# Patient Record
Sex: Male | Born: 1965
Health system: Southern US, Community
[De-identification: ages and names within clinical notes are randomized; demographics above are authoritative.]

## PROBLEM LIST (undated history)

## (undated) DIAGNOSIS — G709 Myoneural disorder, unspecified: Secondary | ICD-10-CM

## (undated) DIAGNOSIS — D72819 Decreased white blood cell count, unspecified: Secondary | ICD-10-CM

## (undated) DIAGNOSIS — K7460 Unspecified cirrhosis of liver: Secondary | ICD-10-CM

## (undated) DIAGNOSIS — I8290 Acute embolism and thrombosis of unspecified vein: Secondary | ICD-10-CM

## (undated) DIAGNOSIS — I4891 Unspecified atrial fibrillation: Secondary | ICD-10-CM

## (undated) DIAGNOSIS — I10 Essential (primary) hypertension: Secondary | ICD-10-CM

## (undated) DIAGNOSIS — M069 Rheumatoid arthritis, unspecified: Secondary | ICD-10-CM

## (undated) DIAGNOSIS — F339 Major depressive disorder, recurrent, unspecified: Secondary | ICD-10-CM

## (undated) DIAGNOSIS — J449 Chronic obstructive pulmonary disease, unspecified: Secondary | ICD-10-CM

## (undated) DIAGNOSIS — E1159 Type 2 diabetes mellitus with other circulatory complications: Secondary | ICD-10-CM

## (undated) DIAGNOSIS — K219 Gastro-esophageal reflux disease without esophagitis: Secondary | ICD-10-CM

## (undated) DIAGNOSIS — K5900 Constipation, unspecified: Secondary | ICD-10-CM

## (undated) DIAGNOSIS — F419 Anxiety disorder, unspecified: Secondary | ICD-10-CM

## (undated) DIAGNOSIS — I509 Heart failure, unspecified: Secondary | ICD-10-CM

## (undated) DIAGNOSIS — L409 Psoriasis, unspecified: Secondary | ICD-10-CM

## (undated) DIAGNOSIS — Z8639 Personal history of other endocrine, nutritional and metabolic disease: Secondary | ICD-10-CM

## (undated) DIAGNOSIS — D696 Thrombocytopenia, unspecified: Secondary | ICD-10-CM

## (undated) DIAGNOSIS — G4730 Sleep apnea, unspecified: Secondary | ICD-10-CM

## (undated) DIAGNOSIS — I499 Cardiac arrhythmia, unspecified: Secondary | ICD-10-CM

## (undated) HISTORY — DX: Myoneural disorder, unspecified: G70.9

## (undated) HISTORY — DX: Constipation, unspecified: K59.00

## (undated) HISTORY — DX: Thrombocytopenia, unspecified: D69.6

## (undated) HISTORY — PX: SCIATIC NERVE EXPLORATION: SHX5373

## (undated) HISTORY — DX: Major depressive disorder, recurrent, unspecified: F33.9

## (undated) HISTORY — DX: Type 2 diabetes mellitus with other circulatory complications: E11.59

## (undated) HISTORY — DX: Chronic obstructive pulmonary disease, unspecified: J44.9

## (undated) HISTORY — PX: CHOLECYSTECTOMY: SHX55

## (undated) HISTORY — PX: TONSILLECTOMY: SUR1361

## (undated) HISTORY — DX: Heart failure, unspecified: I50.9

## (undated) HISTORY — DX: Decreased white blood cell count, unspecified: D72.819

## (undated) HISTORY — DX: Essential (primary) hypertension: I10

## (undated) HISTORY — PX: ANKLE SURGERY: SHX546

## (undated) HISTORY — DX: Rheumatoid arthritis, unspecified: M06.9

## (undated) HISTORY — PX: HERNIA REPAIR: SHX51

---
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
## 1998-08-16 ENCOUNTER — Encounter: Payer: Self-pay | Admitting: Emergency Medicine

## 1998-08-16 ENCOUNTER — Emergency Department (HOSPITAL_COMMUNITY): Admission: EM | Admit: 1998-08-16 | Discharge: 1998-08-16 | Payer: Self-pay | Admitting: Emergency Medicine

## 1999-02-14 ENCOUNTER — Encounter: Payer: Self-pay | Admitting: Emergency Medicine

## 1999-02-14 ENCOUNTER — Emergency Department (HOSPITAL_COMMUNITY): Admission: EM | Admit: 1999-02-14 | Discharge: 1999-02-14 | Payer: Self-pay | Admitting: Emergency Medicine

## 2000-05-01 ENCOUNTER — Encounter: Payer: Self-pay | Admitting: Emergency Medicine

## 2000-05-01 ENCOUNTER — Emergency Department (HOSPITAL_COMMUNITY): Admission: EM | Admit: 2000-05-01 | Discharge: 2000-05-01 | Payer: Self-pay | Admitting: Emergency Medicine

## 2000-06-08 ENCOUNTER — Encounter: Payer: Self-pay | Admitting: Family Medicine

## 2000-06-08 ENCOUNTER — Ambulatory Visit (HOSPITAL_COMMUNITY): Admission: RE | Admit: 2000-06-08 | Discharge: 2000-06-08 | Payer: Self-pay | Admitting: Family Medicine

## 2000-06-09 ENCOUNTER — Encounter (INDEPENDENT_AMBULATORY_CARE_PROVIDER_SITE_OTHER): Payer: Self-pay | Admitting: *Deleted

## 2000-06-28 ENCOUNTER — Ambulatory Visit (HOSPITAL_COMMUNITY): Admission: RE | Admit: 2000-06-28 | Discharge: 2000-06-28 | Payer: Self-pay | Admitting: Gastroenterology

## 2000-06-28 ENCOUNTER — Encounter (INDEPENDENT_AMBULATORY_CARE_PROVIDER_SITE_OTHER): Payer: Self-pay | Admitting: *Deleted

## 2000-06-28 ENCOUNTER — Encounter (INDEPENDENT_AMBULATORY_CARE_PROVIDER_SITE_OTHER): Payer: Self-pay | Admitting: Specialist

## 2000-07-13 ENCOUNTER — Emergency Department (HOSPITAL_COMMUNITY): Admission: EM | Admit: 2000-07-13 | Discharge: 2000-07-13 | Payer: Self-pay | Admitting: Emergency Medicine

## 2001-03-04 ENCOUNTER — Emergency Department (HOSPITAL_COMMUNITY): Admission: EM | Admit: 2001-03-04 | Discharge: 2001-03-04 | Payer: Self-pay | Admitting: Emergency Medicine

## 2002-06-04 ENCOUNTER — Emergency Department (HOSPITAL_COMMUNITY): Admission: EM | Admit: 2002-06-04 | Discharge: 2002-06-04 | Payer: Self-pay | Admitting: Emergency Medicine

## 2003-04-27 ENCOUNTER — Emergency Department (HOSPITAL_COMMUNITY): Admission: EM | Admit: 2003-04-27 | Discharge: 2003-04-27 | Payer: Self-pay | Admitting: Emergency Medicine

## 2003-05-17 ENCOUNTER — Emergency Department (HOSPITAL_COMMUNITY): Admission: EM | Admit: 2003-05-17 | Discharge: 2003-05-17 | Payer: Self-pay | Admitting: Emergency Medicine

## 2004-01-03 ENCOUNTER — Observation Stay (HOSPITAL_COMMUNITY): Admission: EM | Admit: 2004-01-03 | Discharge: 2004-01-03 | Payer: Self-pay | Admitting: Emergency Medicine

## 2004-08-26 ENCOUNTER — Emergency Department (HOSPITAL_COMMUNITY): Admission: EM | Admit: 2004-08-26 | Discharge: 2004-08-26 | Payer: Self-pay | Admitting: Emergency Medicine

## 2005-02-11 ENCOUNTER — Emergency Department (HOSPITAL_COMMUNITY): Admission: EM | Admit: 2005-02-11 | Discharge: 2005-02-11 | Payer: Self-pay | Admitting: Emergency Medicine

## 2005-03-18 ENCOUNTER — Observation Stay (HOSPITAL_COMMUNITY): Admission: EM | Admit: 2005-03-18 | Discharge: 2005-03-19 | Payer: Self-pay | Admitting: Emergency Medicine

## 2005-08-22 ENCOUNTER — Emergency Department (HOSPITAL_COMMUNITY): Admission: EM | Admit: 2005-08-22 | Discharge: 2005-08-22 | Payer: Self-pay | Admitting: Emergency Medicine

## 2006-09-08 ENCOUNTER — Ambulatory Visit: Payer: Self-pay | Admitting: Cardiology

## 2006-10-29 ENCOUNTER — Ambulatory Visit: Payer: Self-pay | Admitting: Cardiology

## 2006-11-14 ENCOUNTER — Ambulatory Visit: Payer: Self-pay | Admitting: Cardiology

## 2006-12-13 ENCOUNTER — Ambulatory Visit: Payer: Self-pay | Admitting: Cardiology

## 2006-12-13 LAB — CONVERTED CEMR LAB
BUN: 16 mg/dL (ref 6–23)
Calcium: 9.6 mg/dL (ref 8.4–10.5)
Chloride: 100 meq/L (ref 96–112)
Creatinine, Ser: 0.9 mg/dL (ref 0.4–1.5)
GFR calc Af Amer: 118 mL/min
GFR calc non Af Amer: 98 mL/min
Glucose, Bld: 111 mg/dL — ABNORMAL HIGH (ref 70–99)

## 2006-12-20 ENCOUNTER — Ambulatory Visit: Payer: Self-pay | Admitting: Cardiology

## 2006-12-20 LAB — CONVERTED CEMR LAB
BUN: 14 mg/dL (ref 6–23)
Calcium: 9.2 mg/dL (ref 8.4–10.5)
Chloride: 98 meq/L (ref 96–112)
GFR calc Af Amer: 118 mL/min
GFR calc non Af Amer: 98 mL/min
Sodium: 135 meq/L (ref 135–145)

## 2007-01-08 ENCOUNTER — Ambulatory Visit: Payer: Self-pay | Admitting: Cardiology

## 2007-01-10 ENCOUNTER — Ambulatory Visit: Payer: Self-pay | Admitting: Cardiology

## 2007-01-10 ENCOUNTER — Inpatient Hospital Stay (HOSPITAL_COMMUNITY): Admission: AD | Admit: 2007-01-10 | Discharge: 2007-01-14 | Payer: Self-pay | Admitting: Cardiology

## 2007-01-10 ENCOUNTER — Ambulatory Visit: Payer: Self-pay | Admitting: Emergency Medicine

## 2007-01-14 ENCOUNTER — Encounter: Payer: Self-pay | Admitting: Pulmonary Disease

## 2007-01-20 ENCOUNTER — Ambulatory Visit (HOSPITAL_BASED_OUTPATIENT_CLINIC_OR_DEPARTMENT_OTHER): Admission: RE | Admit: 2007-01-20 | Discharge: 2007-01-20 | Payer: Self-pay | Admitting: Pulmonary Disease

## 2007-01-20 ENCOUNTER — Encounter: Payer: Self-pay | Admitting: Pulmonary Disease

## 2007-01-22 ENCOUNTER — Ambulatory Visit: Payer: Self-pay | Admitting: Pulmonary Disease

## 2007-01-23 ENCOUNTER — Telehealth: Payer: Self-pay | Admitting: Pulmonary Disease

## 2007-01-30 ENCOUNTER — Ambulatory Visit: Payer: Self-pay | Admitting: Cardiology

## 2007-01-30 LAB — CONVERTED CEMR LAB
Creatinine, Ser: 1.1 mg/dL (ref 0.4–1.5)
GFR calc non Af Amer: 78 mL/min
Glucose, Bld: 108 mg/dL — ABNORMAL HIGH (ref 70–99)
Potassium: 4.3 meq/L (ref 3.5–5.1)

## 2007-01-31 ENCOUNTER — Telehealth: Payer: Self-pay | Admitting: Pulmonary Disease

## 2007-02-03 ENCOUNTER — Telehealth (INDEPENDENT_AMBULATORY_CARE_PROVIDER_SITE_OTHER): Payer: Self-pay | Admitting: *Deleted

## 2007-02-04 ENCOUNTER — Ambulatory Visit: Payer: Self-pay | Admitting: Pulmonary Disease

## 2007-02-04 DIAGNOSIS — G4733 Obstructive sleep apnea (adult) (pediatric): Secondary | ICD-10-CM | POA: Insufficient documentation

## 2007-02-21 DIAGNOSIS — I2510 Atherosclerotic heart disease of native coronary artery without angina pectoris: Secondary | ICD-10-CM | POA: Insufficient documentation

## 2007-02-24 ENCOUNTER — Ambulatory Visit: Payer: Self-pay | Admitting: Pulmonary Disease

## 2007-04-07 ENCOUNTER — Ambulatory Visit: Payer: Self-pay | Admitting: Cardiology

## 2007-04-10 ENCOUNTER — Ambulatory Visit: Payer: Self-pay

## 2007-04-17 ENCOUNTER — Emergency Department (HOSPITAL_COMMUNITY): Admission: EM | Admit: 2007-04-17 | Discharge: 2007-04-17 | Payer: Self-pay | Admitting: Emergency Medicine

## 2007-04-17 ENCOUNTER — Ambulatory Visit: Payer: Self-pay | Admitting: Internal Medicine

## 2007-04-24 ENCOUNTER — Encounter: Payer: Self-pay | Admitting: Pulmonary Disease

## 2007-04-30 ENCOUNTER — Ambulatory Visit: Payer: Self-pay | Admitting: Cardiology

## 2007-04-30 LAB — CONVERTED CEMR LAB
Calcium: 9.2 mg/dL (ref 8.4–10.5)
Chloride: 97 meq/L (ref 96–112)
Creatinine, Ser: 0.8 mg/dL (ref 0.4–1.5)
GFR calc non Af Amer: 112 mL/min
Sodium: 136 meq/L (ref 135–145)

## 2007-05-05 ENCOUNTER — Ambulatory Visit: Payer: Self-pay | Admitting: Cardiology

## 2007-05-05 LAB — CONVERTED CEMR LAB
BUN: 9 mg/dL (ref 6–23)
CO2: 28 meq/L (ref 19–32)
Creatinine, Ser: 0.8 mg/dL (ref 0.4–1.5)

## 2007-05-13 ENCOUNTER — Ambulatory Visit: Payer: Self-pay | Admitting: Cardiology

## 2007-06-17 ENCOUNTER — Ambulatory Visit: Payer: Self-pay | Admitting: Pulmonary Disease

## 2007-07-25 ENCOUNTER — Encounter: Payer: Self-pay | Admitting: Pulmonary Disease

## 2007-09-11 ENCOUNTER — Ambulatory Visit: Payer: Self-pay | Admitting: Cardiology

## 2007-09-29 ENCOUNTER — Ambulatory Visit: Payer: Self-pay | Admitting: Internal Medicine

## 2007-10-27 ENCOUNTER — Telehealth: Payer: Self-pay | Admitting: Internal Medicine

## 2008-02-17 ENCOUNTER — Ambulatory Visit: Payer: Self-pay | Admitting: Cardiology

## 2008-03-02 ENCOUNTER — Encounter: Payer: Self-pay | Admitting: Pulmonary Disease

## 2008-05-22 DIAGNOSIS — I5032 Chronic diastolic (congestive) heart failure: Secondary | ICD-10-CM | POA: Insufficient documentation

## 2008-05-22 DIAGNOSIS — I10 Essential (primary) hypertension: Secondary | ICD-10-CM | POA: Insufficient documentation

## 2008-05-22 DIAGNOSIS — M109 Gout, unspecified: Secondary | ICD-10-CM | POA: Insufficient documentation

## 2008-05-22 DIAGNOSIS — I4891 Unspecified atrial fibrillation: Secondary | ICD-10-CM | POA: Insufficient documentation

## 2008-05-22 DIAGNOSIS — I5033 Acute on chronic diastolic (congestive) heart failure: Secondary | ICD-10-CM | POA: Diagnosis present

## 2008-05-22 DIAGNOSIS — E119 Type 2 diabetes mellitus without complications: Secondary | ICD-10-CM

## 2008-05-22 DIAGNOSIS — I482 Chronic atrial fibrillation, unspecified: Secondary | ICD-10-CM | POA: Insufficient documentation

## 2008-05-22 DIAGNOSIS — Z8639 Personal history of other endocrine, nutritional and metabolic disease: Secondary | ICD-10-CM | POA: Insufficient documentation

## 2008-05-22 DIAGNOSIS — E1159 Type 2 diabetes mellitus with other circulatory complications: Secondary | ICD-10-CM

## 2008-06-10 ENCOUNTER — Telehealth: Payer: Self-pay | Admitting: Internal Medicine

## 2008-06-11 ENCOUNTER — Encounter: Payer: Self-pay | Admitting: Internal Medicine

## 2008-06-16 ENCOUNTER — Telehealth (INDEPENDENT_AMBULATORY_CARE_PROVIDER_SITE_OTHER): Payer: Self-pay | Admitting: *Deleted

## 2008-06-21 ENCOUNTER — Ambulatory Visit: Payer: Self-pay | Admitting: Cardiology

## 2008-06-21 ENCOUNTER — Ambulatory Visit: Payer: Self-pay | Admitting: Internal Medicine

## 2008-06-22 ENCOUNTER — Telehealth (INDEPENDENT_AMBULATORY_CARE_PROVIDER_SITE_OTHER): Payer: Self-pay

## 2008-06-24 ENCOUNTER — Encounter: Payer: Self-pay | Admitting: Internal Medicine

## 2008-07-02 ENCOUNTER — Ambulatory Visit: Payer: Self-pay | Admitting: Internal Medicine

## 2008-07-02 ENCOUNTER — Encounter: Payer: Self-pay | Admitting: Internal Medicine

## 2008-07-02 ENCOUNTER — Ambulatory Visit (HOSPITAL_COMMUNITY): Admission: RE | Admit: 2008-07-02 | Discharge: 2008-07-02 | Payer: Self-pay | Admitting: Internal Medicine

## 2008-07-07 ENCOUNTER — Encounter: Payer: Self-pay | Admitting: Internal Medicine

## 2008-07-27 ENCOUNTER — Ambulatory Visit: Payer: Self-pay | Admitting: Cardiology

## 2008-11-25 ENCOUNTER — Encounter (INDEPENDENT_AMBULATORY_CARE_PROVIDER_SITE_OTHER): Payer: Self-pay | Admitting: *Deleted

## 2008-12-27 ENCOUNTER — Telehealth (INDEPENDENT_AMBULATORY_CARE_PROVIDER_SITE_OTHER): Payer: Self-pay | Admitting: *Deleted

## 2009-01-12 ENCOUNTER — Telehealth: Payer: Self-pay | Admitting: Cardiology

## 2009-01-22 ENCOUNTER — Ambulatory Visit: Payer: Self-pay | Admitting: Cardiology

## 2009-02-18 ENCOUNTER — Telehealth: Payer: Self-pay | Admitting: Cardiology

## 2009-02-28 ENCOUNTER — Ambulatory Visit: Payer: Self-pay | Admitting: Cardiology

## 2009-02-28 ENCOUNTER — Encounter (INDEPENDENT_AMBULATORY_CARE_PROVIDER_SITE_OTHER): Payer: Self-pay | Admitting: *Deleted

## 2009-02-28 DIAGNOSIS — M79609 Pain in unspecified limb: Secondary | ICD-10-CM | POA: Insufficient documentation

## 2009-03-15 ENCOUNTER — Telehealth: Payer: Self-pay | Admitting: Pulmonary Disease

## 2009-03-29 ENCOUNTER — Ambulatory Visit: Payer: Self-pay | Admitting: Pulmonary Disease

## 2009-03-31 ENCOUNTER — Telehealth (INDEPENDENT_AMBULATORY_CARE_PROVIDER_SITE_OTHER): Payer: Self-pay | Admitting: *Deleted

## 2009-04-01 ENCOUNTER — Encounter: Payer: Self-pay | Admitting: Pulmonary Disease

## 2009-04-11 ENCOUNTER — Telehealth: Payer: Self-pay | Admitting: Pulmonary Disease

## 2009-04-12 ENCOUNTER — Encounter: Payer: Self-pay | Admitting: Pulmonary Disease

## 2009-04-20 ENCOUNTER — Telehealth (INDEPENDENT_AMBULATORY_CARE_PROVIDER_SITE_OTHER): Payer: Self-pay | Admitting: *Deleted

## 2009-06-30 ENCOUNTER — Telehealth: Payer: Self-pay | Admitting: Pulmonary Disease

## 2009-07-01 ENCOUNTER — Encounter: Payer: Self-pay | Admitting: Pulmonary Disease

## 2009-07-13 ENCOUNTER — Encounter: Payer: Self-pay | Admitting: Pulmonary Disease

## 2009-08-29 ENCOUNTER — Ambulatory Visit: Payer: Self-pay | Admitting: Pulmonary Disease

## 2009-10-05 ENCOUNTER — Telehealth (INDEPENDENT_AMBULATORY_CARE_PROVIDER_SITE_OTHER): Payer: Self-pay | Admitting: *Deleted

## 2009-12-06 ENCOUNTER — Telehealth: Payer: Self-pay | Admitting: Pulmonary Disease

## 2009-12-08 ENCOUNTER — Encounter: Payer: Self-pay | Admitting: Pulmonary Disease

## 2010-01-08 HISTORY — PX: LIVER BIOPSY: SHX301

## 2010-01-17 ENCOUNTER — Encounter: Payer: Self-pay | Admitting: Pulmonary Disease

## 2010-02-01 ENCOUNTER — Ambulatory Visit: Admission: RE | Admit: 2010-02-01 | Discharge: 2010-02-01 | Payer: Self-pay | Source: Home / Self Care

## 2010-02-01 ENCOUNTER — Encounter: Payer: Self-pay | Admitting: Cardiology

## 2010-02-01 DIAGNOSIS — R000 Tachycardia, unspecified: Secondary | ICD-10-CM | POA: Insufficient documentation

## 2010-02-02 ENCOUNTER — Telehealth: Payer: Self-pay | Admitting: Cardiology

## 2010-02-06 ENCOUNTER — Encounter: Payer: Self-pay | Admitting: Pulmonary Disease

## 2010-02-07 NOTE — Medication Information (Signed)
Summary: Ventolin/[employer]  Ventolin/[employer]   Imported By: Phillis Knack 04/18/2009 08:13:36  _____________________________________________________________________  External Attachment:    Type:   Image     Comment:   External Document

## 2010-02-07 NOTE — Miscellaneous (Signed)
Summary: Orders Update pft charges  Clinical Lists Changes  Orders: Added new Service order of Carbon Monoxide diffusing w/capacity (94720) - Signed Added new Service order of Lung Volumes (94240) - Signed Added new Service order of Spirometry (Pre & Post) (94060) - Signed 

## 2010-02-07 NOTE — Assessment & Plan Note (Signed)
Summary: PFT PRIOR TO APP.CB   Visit Type:  Follow-up Primary Provider/Referring Provider:  Barnet Glasgow, MD  CC:  Pt here for follow up with PFT.  History of Present Illness: 45/M for FU of  severe OSA, started on CPAP 1/09. PSG showed AHI 78/h when his wt was 385 lbs COmpliant, started on xanax 2 weeks ago for anxiety on CPAP. bedtime 10-11 p, wakes up 5.30a, CPAP x 4-5 hrs/night Last FU 6/09 Reviewed download 1/6 to 04/24/07, compliance 45%, avg 3.25 hrs, 90% pressure 14.6 cm Reviewed download 4/25 to 07/24/07  >> avg usage 21/2 hrs, avg pressure 14.7, 90% pressure 12.5  March 29, 2009 1:56 PM  c/o not feeling rested, not being able to stay sleep at night. Pt states was seen by pain management this AM. Pt states is using CPAP approx 4 hours every night with O2 'arthritis' in both knesss, A month ago adm to [facility], no blood clots, fluid build up Gained 25 lbs since PSG CPAP doing it's job. Event shave been cut down to only 2/hr. They were 78/h before CPAP. Pressure is good. Residual tiredness may be related to medications.  August 29, 2009 4:12 PM  PFTs do not show obstruction, FEV1 81%, TLC 76%, DLCO 70% corrects for alveolar volume download 1/5- 4/5  on auto 10-20 >> excellent compliance, avg pr 16, residual AHI 2.6/h wt 400 lbs ! Unclear if advair/spiriva helping  Preventive Screening-Counseling & Management  Alcohol-Tobacco     Smoking Status: never  Current Medications (verified): 1)  Demadex 20 Mg Tabs (Torsemide) .... Take (2)two Every Am and (1) One 6 Hours Later 2)  Bayer Aspirin 325 Mg  Tabs (Aspirin) .... Take 1 Tablet By Mouth Once A Day 3)  Alprazolam 0.5 Mg Tabs (Alprazolam) .Marland Kitchen.. 1 By Mouth As Needed 4)  Vicodin 5-500 Mg  Tabs (Hydrocodone-Acetaminophen) .... Take 1 Tablet By Mouth Four Times A Day 5)  Cardizem 90 Mg  Tabs (Diltiazem Hcl) .... Take 1 Tablet By Mouth Three Times A Day 6)  Klor-Con M20 20 Meq  Tbcr (Potassium Chloride Crys Cr) .... Take 2  Tablets Once Daily 7)  Colchicine 0.6 Mg  Tabs (Colchicine) .... 2 By Mouth Daily 8)  Prilosec 20 Mg Cpdr (Omeprazole) .Marland Kitchen.. 1 By Mouth Two Times A Day 9)  Glucotrol 10 Mg Tabs (Glipizide) .Marland Kitchen.. 1 By Mouth Daily 10)  Spiriva Handihaler 18 Mcg Caps (Tiotropium Bromide Monohydrate) .... As Needed 11)  Advair Diskus 250-50 Mcg/dose Misc (Fluticasone-Salmeterol) .... Two Times A Day 12)  Multivitamins  Tabs (Multiple Vitamin) .... Take 1 Tablet By Mouth Once A Day 13)  Gabapentin 600 Mg Tabs (Gabapentin) .... Take 1 Tablet By Mouth Four Times A Day 14)  Ambien 10 Mg Tabs (Zolpidem Tartrate) .Marland Kitchen.. 1 By Mouth At Bedtime 15)  Meloxicam 15 Mg Tabs (Meloxicam) .... Take 1 Tablet By Mouth Once A Day 16)  Metformin Hcl 500 Mg Tabs (Metformin Hcl) .... Take 1 Tablet By Mouth Once A Day and 1 Additional As Needed 17)  Metolazone 5 Mg Tabs (Metolazone) .... As Directed 18)  Cpap .... With Ahc 19)  Oxygen .... 2 Liters At Bedtime With Cpap 20)  Proair Hfa 108 (90 Base) Mcg/act Aers (Albuterol Sulfate) .... 2 Puffs By Mouth Three Times A Day As Needed  Allergies (verified): No Known Drug Allergies  Past History:  Past Medical History: Last updated: 06/21/2008 CAD (ICD-414.00) (Nonobstructive) ATRIAL FIBRILLATION (ICD-427.31) (Paroxysmal) DIASTOLIC HEART FAILURE, CHRONIC (ICD-428.32) HYPERTENSION, PULMONARY (ICD-416.8) OBESITY,  MORBID (ICD-278.01) SLEEP APNEA, OBSTRUCTIVE (ICD-327.23) GOUT (ICD-274.9) DIABETES MELLITUS (ICD-250.00)    Social History: Last updated: 05/22/2008 Full Time Divorced  Tobacco Use - No.  Alcohol Use - yes - 80-140 oz  Drug Use - no  Review of Systems       The patient complains of weight gain and dyspnea on exertion.  The patient denies anorexia, fever, weight loss, vision loss, decreased hearing, hoarseness, chest pain, syncope, peripheral edema, prolonged cough, headaches, hemoptysis, abdominal pain, melena, hematochezia, severe indigestion/heartburn, hematuria,  muscle weakness, suspicious skin lesions, difficulty walking, depression, unusual weight change, and abnormal bleeding.    Vital Signs:  Patient profile:   45 year old male Height:      70 inches Weight:      400 pounds O2 Sat:      96 % on Room air Temp:     97.8 degrees F oral Pulse rate:   81 / minute BP sitting:   130 / 80  (left arm) Cuff size:   large  Vitals Entered By: Iran Planas CMA (August 29, 2009 4:04 PM)  O2 Flow:  Room air CC: Pt here for follow up with PFT Comments Medications reviewed with patient Verified contact number and pharmacy with patient Iran Planas CMA  August 29, 2009 4:04 PM    Physical Exam  Additional Exam:  No acute distress. VSS.  HEENT: unremarkable. Oropharynx is clear. TM nml, EAC clear Neck: supple, no adenopathy, or JVD Lungs: CTA bilat. no wheezing/crackles Card: r/r/r, no m/r/g Ext: warm w/o calf tenderness, cyanosis clubbing, or tr. edema    Impression & Recommendations:  Problem # 1:  SLEEP APNEA, OBSTRUCTIVE (ICD-327.23)  Compliance encouraged, wt loss emphasized, asked to avoid meds with sedative side effects, cautioned against driving when sleepy.   Orders: Est. Patient Level III (95093)  Problem # 2:  OBESITY, MORBID (ICD-278.01)  He has multiple morbidities from obesity -arthritis, diabtees & OSA- consider wt loss surgery - since he is not making much progress with diet/ exercise. PFTs show restriction - doubt advair helping much - will taper to off  Orders: Est. Patient Level III (26712)  Medications Added to Medication List This Visit: 1)  Vicodin 5-500 Mg Tabs (Hydrocodone-acetaminophen) .... Take 1 tablet by mouth four times a day 2)  Gabapentin 600 Mg Tabs (Gabapentin) .... Take 1 tablet by mouth four times a day 3)  Metformin Hcl 500 Mg Tabs (Metformin hcl) .... Take 1 tablet by mouth once a day and 1 additional as needed  Patient Instructions: 1)  Copy sent to: dr nyland 2)  OK to decrease advair  to once daily & stop completely if no difference 3)  Your main problem is your weight - if diet & exercise have not helped , you may have to consider gastric bypass surgery - discuss with DR Edrick Oh

## 2010-02-07 NOTE — Assessment & Plan Note (Signed)
Summary: 1 YR F/U  Medications Added ALPRAZOLAM 0.5 MG TABS (ALPRAZOLAM) 1 by mouth as needed COLCHICINE 0.6 MG  TABS (COLCHICINE) 2 by mouth daily PRILOSEC 20 MG CPDR (OMEPRAZOLE) 1 by mouth two times a day GLUCOTROL 10 MG TABS (GLIPIZIDE) 1 by mouth daily SPIRIVA HANDIHALER 18 MCG CAPS (TIOTROPIUM BROMIDE MONOHYDRATE) daily GABAPENTIN 600 MG TABS (GABAPENTIN) 1 by mouth three times a day FUROSEMIDE 80 MG TABS (FUROSEMIDE) 1 and 1/2 by mouth daily as directed SIMVASTATIN 20 MG TABS (SIMVASTATIN) 1 by mouth daily AMBIEN 10 MG TABS (ZOLPIDEM TARTRATE) 1 by mouth at bedtime      Allergies Added: NKDA   Visit Type:  Follow-up Primary Provider:  Barnet Glasgow, MD  CC:  CHF.  History of Present Illness: The patient returns for followup of his diastolic heart failure.  Since I last saw him he says he was hospitalized at [facility] apparently with volume overload. He says he was told that he did not have a pulmonary embolism. He was treated with apparently increased diuretics though he was sent home on his usual regimen. Since that time he denies any further acute shortness of breath. He has had some lower extremity swelling but reports this is related to gout. He has mostly been limited by gout. He is seeing Dr.Nyland for this.  He also describes pain in his eyes. This is a burning discomfort the last most of the time and is not associated with activities such as walking. He does not get calf discomfort. He does not describe leg weakness. His chronic continued problem is fatigue.  Current Medications (verified): 1)  Demadex 20 Mg Tabs (Torsemide) .... Take (2)two Every Am and (1) One 6 Hours Later 2)  Bayer Aspirin 325 Mg  Tabs (Aspirin) .... Take 1 Tablet By Mouth Once A Day 3)  Alprazolam 0.5 Mg Tabs (Alprazolam) .Marland Kitchen.. 1 By Mouth As Needed 4)  Vicodin 5-500 Mg  Tabs (Hydrocodone-Acetaminophen) .Marland Kitchen.. 1-2 By Mouth Daily As Needed 5)  Cardizem 90 Mg  Tabs (Diltiazem Hcl) .... Take 1 Tablet  By Mouth Three Times A Day 6)  Klor-Con M20 20 Meq  Tbcr (Potassium Chloride Crys Cr) .... Take 2 Tablets Once Daily 7)  Colchicine 0.6 Mg  Tabs (Colchicine) .... 2 By Mouth Daily 8)  Prilosec 20 Mg Cpdr (Omeprazole) .Marland Kitchen.. 1 By Mouth Two Times A Day 9)  Glucotrol 10 Mg Tabs (Glipizide) .Marland Kitchen.. 1 By Mouth Daily 10)  Spiriva Handihaler 18 Mcg Caps (Tiotropium Bromide Monohydrate) .... Daily 11)  Advair Diskus 250-50 Mcg/dose Misc (Fluticasone-Salmeterol) .... Two Times A Day 12)  Multivitamins  Tabs (Multiple Vitamin) .... Take 1 Tablet By Mouth Once A Day 13)  Gabapentin 600 Mg Tabs (Gabapentin) .Marland Kitchen.. 1 By Mouth Three Times A Day 14)  Furosemide 80 Mg Tabs (Furosemide) .Marland Kitchen.. 1 and 1/2 By Mouth Daily As Directed 15)  Simvastatin 20 Mg Tabs (Simvastatin) .Marland Kitchen.. 1 By Mouth Daily 16)  Ambien 10 Mg Tabs (Zolpidem Tartrate) .Marland Kitchen.. 1 By Mouth At Bedtime  Allergies (verified): No Known Drug Allergies  Past History:  Past Medical History: Reviewed history from 06/21/2008 and no changes required. CAD (ICD-414.00) (Nonobstructive) ATRIAL FIBRILLATION (ICD-427.31) (Paroxysmal) DIASTOLIC HEART FAILURE, CHRONIC (ICD-428.32) HYPERTENSION, PULMONARY (ICD-416.8) OBESITY, MORBID (ICD-278.01) SLEEP APNEA, OBSTRUCTIVE (ICD-327.23) GOUT (ICD-274.9) DIABETES MELLITUS (ICD-250.00)    Past Surgical History: Reviewed history from 06/21/2008 and no changes required. Hernia - 1965 Tonsillectomy - 1969  Review of Systems       Rheumatoid arthritis (in his feet  diagnosed recently by a podiatrist). Otherwise as stated in the history of present illness negative for all other systems.  Vital Signs:  Patient profile:   45 year old male Height:      70 inches Weight:      391 pounds BMI:     56.31 Pulse rate:   100 / minute Resp:     16 per minute BP sitting:   111 / 66  (left arm)  Vitals Entered By: Levora Angel, CNA (February 28, 2009 4:31 PM)  Physical Exam  General:  Well developed, well nourished, in  no acute distress. Head:  normocephalic and atraumatic Eyes:  PERRLA/EOM intact; conjunctiva and lids normal. Mouth:  Teeth, gums and palate normal. Oral mucosa normal. Neck:  Neck supple, no JVD. No masses, thyromegaly or abnormal cervical nodes. Chest Wall:  no deformities or breast masses noted Lungs:  Clear bilaterally to auscultation and percussion. Heart:  Non-displaced PMI, chest non-tender; regular rate and rhythm, S1, S2 without murmurs, rubs or gallops. Carotid upstroke normal, no bruit. Normal abdominal aortic size, no bruits. Femorals normal pulses, no bruits. Pedals normal pulses. trace bilateral lower extremity edema, no varicosities. Abdomen:  Bowel sounds positive; abdomen soft and non-tender without masses, organomegaly, or hernias noted. No hepatosplenomegaly, obese Msk:  Back normal, normal gait. Muscle strength and tone normal. Neurologic:  Alert and oriented x 3. Skin:  eczema patches on his knees Psych:  Normal affect.   EKG  Procedure date:  02/28/2009  Findings:      sinus rhythm, rate 100, axis within normal limits, intervals within normal limits, no acute ST-T wave changes, baseline artifact includes adequate analysis  Impression & Recommendations:  Problem # 1:  DIASTOLIC HEART FAILURE, CHRONIC (ICD-428.32) The patient seems to be euvolemic at present. No further cardiovascular testing is suggested. He will continue with meds as listed.  Problem # 2:  LEG PAIN (ICD-729.5) He describes leg pain. I don't see a clear etiology to this. It is not vascular. It could be neuropathic, orthopedic or muscular. I have suggested that he stop his Zocor to see if this helps. He may need to see a neurologist or her orthopedist for possible back pain leading to this. He does have chronic back pain.  Problem # 3:  ATRIAL FIBRILLATION (ICD-427.31) He has had no symptomatic or documented recurrences of this. No change in therapy is indicated. Orders: EKG w/ Interpretation  (93000)  Problem # 4:  OBESITY, MORBID (ICD-278.01) He has lost 20 pounds and I congratulated him on this.  Patient Instructions: 1)  Your physician recommends that you schedule a follow-up appointment in: [place] 2)  Your physician has recommended you make the following change in your medication: STOP SIMVASTATIN

## 2010-02-07 NOTE — Assessment & Plan Note (Signed)
Summary: rov/apc   Visit Type:  IME Initial Primary Provider/Referring Provider:  Barnet Glasgow, MD  CC:  Pt c/o not feeling rested and not being able to stay sleep at night. Pt states was seen by pain management this AM. Pt states is using CPAP approx 4 hours every night with O2.  History of Present Illness: 43/M for FU of  severe OSA, started on CPAP 1/09. PSG showed AHI 78/h when his wt was 385 lbs COmpliant, started on xanax 2 weeks ago for anxiety on CPAP. bedtime 10-11 p, wakes up 5.30a, CPAP x 4-5 hrs/night Last FU 6/09 Reviewed download 1/6 to 04/24/07, compliance 45%, avg 3.25 hrs, 90% pressure 14.6 cm Reviewed download 4/25 to 07/24/07  >> avg usage 21/2 hrs, avg pressure 14.7, 90% pressure 12.5  March 29, 2009 1:56 PM  c/o not feeling rested, not being able to stay sleep at night. Pt states was seen by pain management this AM. Pt states is using CPAP approx 4 hours every night with O2 'arthritis' in both knesss, A month ago adm to [facility], no blood clots, fluid build up Gained 25 lbs since PSG  Current Medications (verified): 1)  Demadex 20 Mg Tabs (Torsemide) .... Take (2)two Every Am and (1) One 6 Hours Later 2)  Bayer Aspirin 325 Mg  Tabs (Aspirin) .... Take 1 Tablet By Mouth Once A Day 3)  Alprazolam 0.5 Mg Tabs (Alprazolam) .Marland Kitchen.. 1 By Mouth As Needed 4)  Vicodin 5-500 Mg  Tabs (Hydrocodone-Acetaminophen) .Marland Kitchen.. 1-2 By Mouth Daily As Needed 5)  Cardizem 90 Mg  Tabs (Diltiazem Hcl) .... Take 1 Tablet By Mouth Three Times A Day 6)  Klor-Con M20 20 Meq  Tbcr (Potassium Chloride Crys Cr) .... Take 2 Tablets Once Daily 7)  Colchicine 0.6 Mg  Tabs (Colchicine) .... 2 By Mouth Daily 8)  Prilosec 20 Mg Cpdr (Omeprazole) .Marland Kitchen.. 1 By Mouth Two Times A Day 9)  Glucotrol 10 Mg Tabs (Glipizide) .Marland Kitchen.. 1 By Mouth Daily 10)  Spiriva Handihaler 18 Mcg Caps (Tiotropium Bromide Monohydrate) .... As Needed 11)  Advair Diskus 250-50 Mcg/dose Misc (Fluticasone-Salmeterol) .... Two Times A  Day 12)  Multivitamins  Tabs (Multiple Vitamin) .... Take 1 Tablet By Mouth Once A Day 13)  Gabapentin 600 Mg Tabs (Gabapentin) .Marland Kitchen.. 1 By Mouth Three Times A Day 14)  Simvastatin 20 Mg Tabs (Simvastatin) .Marland Kitchen.. 1 By Mouth Daily 15)  Ambien 10 Mg Tabs (Zolpidem Tartrate) .Marland Kitchen.. 1 By Mouth At Bedtime 16)  Meloxicam 15 Mg Tabs (Meloxicam) .... Take 1 Tablet By Mouth Once A Day 17)  Metformin Hcl 500 Mg Tabs (Metformin Hcl) .... Take 1 Tablet By Mouth Two Times A Day 18)  Metolazone 5 Mg Tabs (Metolazone) .... As Directed 19)  Cpap .... With Ahc 20)  Oxygen .... 2 Liters At Bedtime With Cpap  Allergies (verified): No Known Drug Allergies  Past History:  Past Medical History: Last updated: 06/21/2008 CAD (ICD-414.00) (Nonobstructive) ATRIAL FIBRILLATION (ICD-427.31) (Paroxysmal) DIASTOLIC HEART FAILURE, CHRONIC (ICD-428.32) HYPERTENSION, PULMONARY (ICD-416.8) OBESITY, MORBID (ICD-278.01) SLEEP APNEA, OBSTRUCTIVE (ICD-327.23) GOUT (ICD-274.9) DIABETES MELLITUS (ICD-250.00)    Social History: Last updated: 05/22/2008 Full Time Divorced  Tobacco Use - No.  Alcohol Use - yes - 80-140 oz  Drug Use - no  Review of Systems       The patient complains of dyspnea on exertion.  The patient denies anorexia, fever, weight loss, weight gain, vision loss, decreased hearing, hoarseness, chest pain, syncope, peripheral edema, prolonged cough, headaches,  hemoptysis, abdominal pain, melena, hematochezia, severe indigestion/heartburn, hematuria, muscle weakness, suspicious skin lesions, transient blindness, difficulty walking, depression, unusual weight change, and abnormal bleeding.    Vital Signs:  Patient profile:   45 year old male Height:      70 inches Weight:      412.25 pounds O2 Sat:      94 % on Room air Temp:     98.4 degrees F oral Pulse rate:   93 / minute BP sitting:   114 / 72  (right arm) Cuff size:   large  Vitals Entered By: Iran Planas CMA (March 29, 2009 1:51 PM)  O2  Flow:  Room air CC: Pt c/o not feeling rested, not being able to stay sleep at night. Pt states was seen by pain management this AM. Pt states is using CPAP approx 4 hours every night with O2 Is Patient Diabetic? Yes Comments Medications reviewed with patient Verified contact number and pharmacy with patient Iran Planas CMA  March 29, 2009 1:52 PM    Physical Exam  Additional Exam:  No acute distress. VSS.  HEENT: unremarkable. Oropharynx is clear. TM nml, EAC clear Neck: supple, no adenopathy, or JVD Lungs: CTA bilat. no wheezing/crackles Card: r/r/r, no m/r/g Ext: warm w/o calf tenderness, cyanosis clubbing, or tr. edema    Impression & Recommendations:  Problem # 1:  SLEEP APNEA, OBSTRUCTIVE (ICD-327.23)  Compliance encouraged, wt loss emphasized, asked to avoid meds with sedative side effects, cautioned against driving when sleepy.  Review download.  Orders: Est. Patient Level III (29924) Prescription Created Electronically [phone])  Problem # 2:  HYPERTENSION, PULMONARY (ICD-416.8)  -related to diastolic CHF & likley, obesity hypoventilation. Estimate lung function next visit.  Orders: Est. Patient Level III (19622) Prescription Created Electronically [phone])  Medications Added to Medication List This Visit: 1)  Spiriva Handihaler 18 Mcg Caps (Tiotropium bromide monohydrate) .... As needed 2)  Meloxicam 15 Mg Tabs (Meloxicam) .... Take 1 tablet by mouth once a day 3)  Metformin Hcl 500 Mg Tabs (Metformin hcl) .... Take 1 tablet by mouth two times a day 4)  Metolazone 5 Mg Tabs (Metolazone) .... As directed 5)  Cpap  .... With ahc 6)  Oxygen  .... 2 liters at bedtime with cpap 7)  Ventolin Hfa 108 (90 Base) Mcg/act Aers (Albuterol sulfate) .... 2 puffs three times a day as needed  Patient Instructions: 1)  Copy sent to: Dr Edrick Oh 2)  Please schedule a follow-up appointment in 3 months. 3)  We will look at download 4)  Breathing test -spirometry pre/post  next visit Prescriptions: VENTOLIN HFA 108 (90 BASE) MCG/ACT AERS (ALBUTEROL SULFATE) 2 puffs three times a day as needed  #1 x 3   Entered and Authorized by:   Leanna Sato Elsworth Soho MD   Signed by:   Leanna Sato Elsworth Soho MD on 03/29/2009   Method used:   Electronically to        [facility] (retail)       [address]       [place], [place]  [address]       Ph: [phone]       Fax: [phone]   RxID:   [id number]    Immunization History:  Influenza Immunization History:    Influenza:  historical (10/11/2008)  Pneumovax Immunization History:    Pneumovax:  historical (09/08/2008)   Appended Document: rov/apc reviewed download 1/5- 4/5  on  auto 10-20 >> excellent compliance, avg pr 16, residual AHI 2.6/h

## 2010-02-07 NOTE — Progress Notes (Signed)
Summary: hospital stay/meds   Phone Note Call from Patient Call back at [phone]   Caller: Patient Reason for Call: Talk to Nurse Summary of Call: pt was in [facility] hospital last...Marland KitchenMarland Kitchen could he start taking a muilt-vitamin?  he is feeling tired alot Initial call taken by: Darnell Level,  February 18, 2009 11:25 AM  Follow-up for Phone Call        Pt aware OK for him to start a multi-vit.  states he felt fine for about 1 week or so after his hospitalization but now feeling tired.  He has an upcoming appointment scheduled with Dr Percival Spanish  Follow-up by: Sim Boast, RN,  February 18, 2009 11:29 AM

## 2010-02-07 NOTE — Miscellaneous (Signed)
Summary: Orders Update   Clinical Lists Changes  Orders: Added new Service order of [place] Show NS50 (NS50) - Signed

## 2010-02-07 NOTE — Letter (Signed)
Summary: CMN for Oxygen & Oximetry/[facility]  CMN for Oxygen & Oximetry/[facility]   Imported By: Phillis Knack 12/12/2009 14:50:45  _____________________________________________________________________  External Attachment:    Type:   Image     Comment:   External Document

## 2010-02-07 NOTE — Progress Notes (Signed)
Summary: nos appt  Phone Note Call from Patient   Caller: [name]  Call For: [name] Summary of Call: LMTCB x2 to rsc nos from 6/22. Initial call taken by: Netta Neat,  June 30, 2009 3:19 PM

## 2010-02-07 NOTE — Letter (Signed)
Summary: SMN/[facility]  SMN/[facility]   Imported By: Bubba Hales 07/19/2009 10:56:53  _____________________________________________________________________  External Attachment:    Type:   Image     Comment:   External Document

## 2010-02-07 NOTE — Progress Notes (Signed)
Summary: Ventolin change to ProAir  Phone Note From Pharmacy   Summary of Call: Received fax from insurance stating will no longer cover Ventolin will need to change to Tier 2 ProAir. Changes made in EMR and new order sent to pharmacy. Pt informed of same. Form sent down to be filed. Iran Planas CMA  April 11, 2009 5:40 PM     New/Updated Medications: PROAIR HFA 108 (90 BASE) MCG/ACT AERS (ALBUTEROL SULFATE) 2 puffs by mouth three times a day as needed Prescriptions: PROAIR HFA 108 (90 BASE) MCG/ACT AERS (ALBUTEROL SULFATE) 2 puffs by mouth three times a day as needed  #1 x 2   Entered by:   Iran Planas CMA   Authorized by:   Leanna Sato. Elsworth Soho MD   Signed by:   Iran Planas CMA on 04/11/2009   Method used:   Electronically to        [facility] (retail)       [address]       [place], [place]  [address]       Ph: [phone]       Fax: [phone]   RxID:   [id number]

## 2010-02-07 NOTE — Progress Notes (Signed)
Summary: cpap   Phone Note Call from Patient Call back at [phone]   Caller: Patient Call For: Evie Crumpler Reason for Call: Talk to Nurse Summary of Call: last 6 months on cpap, tired all the time, feels like he is going backwards.  Does he need something else, please advise. Initial call taken by: Zigmund Gottron,  March 15, 2009 8:41 AM  Follow-up for Phone Call        [facility].  Pt needs ov with RA.  Last seen 06-2007.  Jinny Blossom Reynolds LPN  March 16, 2559 5:48 AM   appt scheduled by [facility] for  march, 22 with RA. [place] Bing CMA  March 16, 2009 10:22 AM

## 2010-02-07 NOTE — Progress Notes (Signed)
Summary: problems breathing after advair decreased > take spiriva  Phone Note Call from Patient Call back at Home Phone [phone]   Caller: Patient Call For: [name] Reason for Call: Refill Medication Summary of Call: Need refill in spiriva handihaler 77mg.//[facility] [place] Initial call taken by: JNetta Neat  October 05, 2009 12:44 PM  Follow-up for Phone Call        Per pt's med list, pt is taking spiriva as needed.  ATC above number but went directly to VM - LMOMTCB to clarrify  if pt is actually taking spiriva as needed.  CRaymondo BandRN  October 05, 2009 12:58 PM   Pt returned call.  He states he has currently not been taking spiriva but since Dr. AElsworth Sohodecreased ALucia Gaskinsonce daily this is making it "harder to breath."  Therefore, he was going to try to restart spiriva to see if this helped.  Will forward message to Dr. AElsworth Soho pls advise recs.  Thanks! Follow-up by: CRaymondo BandRN,  October 05, 2009 1:13 PM  Additional Follow-up for Phone Call Additional follow up Details #1::        ok to take spiriva once daily .  Additional Follow-up by: RLeanna Sato AElsworth SohoMD,  October 06, 2009 9:44 PM    Additional Follow-up for Phone Call Additional follow up Details #2::    Spriva rx sent to pharmacy.  LMOMTCB to inform pt of this and RA's recs. CRaymondo BandRN  October 07, 2009 9:04 AM   Pt returned call.  He is aware per RA to take spiriva once daily and rx [facility].  He verbalized understanding.  Crystal Jones RN  October 07, 2009 9:17 AM   New/Updated Medications: SPIRIVA HANDIHALER 18 MCG CAPS (TIOTROPIUM BROMIDE MONOHYDRATE) once daily Prescriptions: SPIRIVA HANDIHALER 18 MCG CAPS (TIOTROPIUM BROMIDE MONOHYDRATE) once daily  #1 x 3   Entered by:   CRaymondo BandRN   Authorized by:   RLeanna Sato AElsworth SohoMD   Signed by:   CRaymondo BandRN on 10/07/2009   Method used:   [facility]    [address]      [place]  [address]      Ph: [phone]      Fax: [phone]  RxID:   [id number]

## 2010-02-07 NOTE — Miscellaneous (Signed)
Summary: update med  Clinical Lists Changes  Medications: Added new medication of MULTIVITAMINS  TABS (MULTIPLE VITAMIN) Take 1 tablet by mouth once a day

## 2010-02-07 NOTE — Progress Notes (Addendum)
Summary: pt  needs SMW and ONO  Phone Note Call from Patient Call back at [facility] Phone [phone]   Caller: Patient Call For: alva Summary of Call: [facility] that [facility] told [facility] to call and scheduled an ONO/ SMW w/ alva. pls advise.  Initial call taken by: Cooper Render, CNA,  December 06, 2009 10:10 AM  Follow-up for Phone Call        Called, spoke with pt.  He states [facility] is telling him he needs to have Dr. Elsworth Soho order SMW and ONO for insurance purposes.  Called [facility], spoke with [place].  Per Darilyn, Medicare is requiring pt have a SMW with recovery sats and ONO inorder to pay for his o2.    Dr. Elsworth Soho, pls advise if ok to order these tests.  Thanks! Follow-up by: Raymondo Band RN,  December 06, 2009 10:29 AM  Additional Follow-up for Phone Call Additional follow up Details #1::        ONO & ambulatory satn should suffice Do not need 6MW Done Additional Follow-up by: Leanna Sato. Elsworth Soho MD,  December 06, 2009 2:55 PM    Additional Follow-up for Phone Call Additional follow up Details #2::    Overnight noct oximetry on CPAP and RA given to [facility] to arrange and download faxed to Dr. Elsworth Soho. Spoke with [place] wtih [facility] stated that they would be able to arrange the ambulatory sats. Per guidelines they will contract test out to provide pt with service. Order sent to [facility] to provide both overnight noct oximetry on CPAP and RA along with Ambulatory Sat. Pt called and informed. Phillips Grout  December 06, 2009 3:37 PM    Appended Document: pt  needs SMW and ONO note letter from [facility] re: O2 testing

## 2010-02-07 NOTE — Progress Notes (Signed)
Summary: rx  Phone Note Call from Patient Call back at Home Phone [phone]   Caller: Patient Call For: Elsworth Soho Summary of Call: pt's inhaler was sent to wrong pharm - please re-send to [facility], [address] [phone] Initial call taken by: Zigmund Gottron,  March 31, 2009 11:51 AM  Follow-up for Phone Call        called rx into the pharmacy per pt request and pt is aware Follow-up by: Don Broach,  March 31, 2009 12:38 PM

## 2010-02-07 NOTE — Progress Notes (Signed)
Summary: PAPERWORK  done   Phone Note From Other Clinic Call back at [phone]   Caller: [facility] ATTORNEY Summary of Call: [place] Initial call taken by: Darnell Level,  January 12, 2009 3:44 PM  Follow-up for Phone Call        Spoke with Neoma Laming from [facility] requesting status of form on pt. Frankey Laidler's health history. I let Neoma Laming know Md. is not in office untill Friday 01/15/08. It is okay with her. Carollee Sires, RN, BSN  January 12, 2009 4:02 PM paperwork completed and send to [facility], [facility] aware.  Follow-up by: Sim Boast, RN,  January 13, 2009 9:11 AM

## 2010-02-07 NOTE — Progress Notes (Signed)
Summary: cpap/ pt still tired-  Phone Note Call from Patient Call back at Home Phone [phone]   Caller: Patient Call For: [name] Summary of Call: pt is still tired at the end of the day. has [facility] in [facility]. please advise.  Initial call taken by: Cooper Render, CNA,  April 20, 2009 12:15 PM  Follow-up for Phone Call        ATC patient, pt answered but unable to hear him. WCB. [place] Bing CMA  April 20, 2009 4:15 PM   Pt states he sent in chip to [facility] last week and was wondering about results. He states that he is using his CPAP every night, but does not feel rested in the morning when he wakes up, and he gets sleepy by midday. He does have a new mask and states it is working well. Please advise. [place] Bing CMA  April 20, 2009 4:23 PM   Additional Follow-up for Phone Call Additional follow up Details #1::        pl place download in my look-at Additional Follow-up by: Leanna Sato. Elsworth Soho MD,  April 21, 2009 1:56 PM    Additional Follow-up for Phone Call Additional follow up Details #2::    [facility] for Pura Spice with [facility] to obtain download and fax over to [place] at [phone]. Rhonda Cobb  April 21, 2009 2:01 PM  Download recd. and placed in Dr. Bari Mantis look at. Rhonda Cobb  April 21, 2009 2:12 PM   Let him know - CPAP doing it's job. Event shave been cut down to only 2/hr. They were 78/h before CPAP. Pressure is good. Residual tiredness may be related to medications. FU in 3 months Follow-up by: Leanna Sato. Elsworth Soho MD,  April 22, 2009 10:06 AM  Additional Follow-up for Phone Call Additional follow up Details #3:: Details for Additional Follow-up Action Taken: [address] [facility].Francesca Jewett [facility]  April 22, 2009 10:19 AM  The patient is aware CPAP is working well and we will send him a letter once [name]'s schedule opens up for July. Pt had verbal understanding to continue CPAP use nightly. Additional Follow-up by: Francesca Jewett CMA,  April 25, 2009 4:03 PM

## 2010-02-09 NOTE — Assessment & Plan Note (Signed)
Summary: [facility]  Medications Added VITAMIN D3 50000 UNIT CAPS (CHOLECALCIFEROL) 2 by mouth weekly TOPAMAX 100 MG TABS (TOPIRAMATE) 1 by mouth daily LYRICA 150 MG CAPS (PREGABALIN) 1 by mouth two times a day ZETIA 10 MG TABS (EZETIMIBE) 1 by mouth daily ALLOPURINOL 100 MG TABS (ALLOPURINOL) 1 by mouth daily ANDROGEL 25 MG/2.5GM GEL (TESTOSTERONE) UAD      Allergies Added: NKDA  Visit Type:  Follow-up Primary Provider:  Worthy Keeler PAc  CC:  palpitations.  History of Present Illness: The patient presents for evaluation of palpitations. He has a history chronic dyspnea, sleep apnea and morbid obesity. He recently described tachypalpitations. These would happen sporadically. He said they were occurring frequently recently. He had an EKG and then a 24-hour Holter which demonstrated few supraventricular beats but no sustained arrhythmias.  He has had no presyncope or syncope.  He has had no new chest, neck or arm pain.  He has his chronic dyspnea but no new PND or orthopnea. He past had no new edema. He actually has been more ambulatory distance getting neuropathy treated with Lyrica.  Current Medications (verified): 1)  Demadex 20 Mg Tabs (Torsemide) .... Take (2)two Every Am and (1) One 6 Hours Later 2)  Bayer Aspirin 325 Mg  Tabs (Aspirin) .... Take 1 Tablet By Mouth Once A Day 3)  Alprazolam 0.5 Mg Tabs (Alprazolam) .Marland Kitchen.. 1 By Mouth As Needed 4)  Vicodin 5-500 Mg  Tabs (Hydrocodone-Acetaminophen) .... Take 1 Tablet By Mouth Four Times A Day 5)  Cardizem 90 Mg  Tabs (Diltiazem Hcl) .... Take 1 Tablet By Mouth Three Times A Day 6)  Klor-Con M20 20 Meq  Tbcr (Potassium Chloride Crys Cr) .... Take 2 Tablets Once Daily 7)  Colchicine 0.6 Mg  Tabs (Colchicine) .... 2 By Mouth Daily 8)  Prilosec 20 Mg Cpdr (Omeprazole) .Marland Kitchen.. 1 By Mouth Two Times A Day 9)  Glucotrol 10 Mg Tabs (Glipizide) .Marland Kitchen.. 1 By Mouth Daily 10)  Spiriva Handihaler 18 Mcg Caps (Tiotropium Bromide Monohydrate) ....  Once Daily 11)  Advair Diskus 250-50 Mcg/dose Misc (Fluticasone-Salmeterol) .... Two Times A Day 12)  Multivitamins  Tabs (Multiple Vitamin) .... Take 1 Tablet By Mouth Once A Day 13)  Ambien 10 Mg Tabs (Zolpidem Tartrate) .Marland Kitchen.. 1 By Mouth At Bedtime 14)  Meloxicam 15 Mg Tabs (Meloxicam) .... Take 1 Tablet By Mouth Once A Day 15)  Metformin Hcl 500 Mg Tabs (Metformin Hcl) .... Take 1 Tablet By Mouth Once A Day and 1 Additional As Needed 16)  Metolazone 5 Mg Tabs (Metolazone) .... As Directed 17)  Cpap .... With Ahc 18)  Oxygen .... 2 Liters At Bedtime With Cpap 19)  Proair Hfa 108 (90 Base) Mcg/act Aers (Albuterol Sulfate) .... 2 Puffs By Mouth Three Times A Day As Needed 20)  Vitamin D3 50000 Unit Caps (Cholecalciferol) .... 2 By Mouth Weekly 21)  Topamax 100 Mg Tabs (Topiramate) .Marland Kitchen.. 1 By Mouth Daily 22)  Lyrica 150 Mg Caps (Pregabalin) .Marland Kitchen.. 1 By Mouth Two Times A Day 23)  Zetia 10 Mg Tabs (Ezetimibe) .Marland Kitchen.. 1 By Mouth Daily 24)  Allopurinol 100 Mg Tabs (Allopurinol) .Marland Kitchen.. 1 By Mouth Daily 25)  Androgel 25 Mg/2.5gm Gel (Testosterone) .... Uad  Allergies (verified): No Known Drug Allergies  Past History:  Past Medical History: Reviewed history from 06/21/2008 and no changes required. CAD (ICD-414.00) (Nonobstructive) ATRIAL FIBRILLATION (ICD-427.31) (Paroxysmal) DIASTOLIC HEART FAILURE, CHRONIC (ICD-428.32) HYPERTENSION, PULMONARY (ICD-416.8) OBESITY, MORBID (ICD-278.01) SLEEP APNEA, OBSTRUCTIVE (ICD-327.23) GOUT (ICD-274.9)  DIABETES MELLITUS (ICD-250.00)    Past Surgical History: Reviewed history from 06/21/2008 and no changes required. Hernia - 1965 Tonsillectomy - 1969  Review of Systems       As stated in the HPI and negative for all other systems.   Vital Signs:  Patient profile:   45 year old male Height:      70 inches Weight:      405 pounds BMI:     58.32 Pulse rate:   88 / minute Resp:     22 per minute BP sitting:   108 / 72  (right arm)  Vitals Entered  By: Levora Angel, CNA (February 01, 2010 2:23 PM)  Physical Exam  General:  Well developed, well nourished, in no acute distress. Head:  normocephalic and atraumatic Eyes:  PERRLA/EOM intact; conjunctiva and lids normal. Mouth:  Teeth, gums and palate normal. Oral mucosa normal. Neck:  Neck supple, no JVD. No masses, thyromegaly or abnormal cervical nodes. Chest Wall:  no deformities or breast masses noted Lungs:  Clear bilaterally to auscultation and percussion. Abdomen:  Bowel sounds positive; abdomen soft and non-tender without masses, organomegaly, or hernias noted. No hepatosplenomegaly, obese Msk:  Back normal, normal gait. Muscle strength and tone normal. Extremities:  Mild bilateral lower extremity edema Neurologic:  Alert and oriented x 3. Skin:  eczema patches on his knees Cervical Nodes:  no significant adenopathy Psych:  Normal affect.   Detailed Cardiovascular Exam  Neck    Carotids: Carotids full and equal bilaterally without bruits.      Neck Veins: Normal, no JVD (as far as I can tell)  Heart    Inspection: no deformities or lifts noted.      Palpation: normal PMI with no thrills palpable.      Auscultation: regular rate and rhythm, S1, S2 without murmurs, rubs, gallops, or clicks.    Vascular    Abdominal Aorta: no palpable masses, pulsations, or audible bruits, (exam very compromised by his morbid obesity)     Femoral Pulses: normal femoral pulses bilaterally.      Pedal Pulses: normal pedal pulses bilaterally.      Radial Pulses: normal radial pulses bilaterally.      Peripheral Circulation: no clubbing, cyanosis, with normal capillary refill.     Impression & Recommendations:  Problem # 1:  UNSPECIFIED TACHYCARDIA (ICD-785.0) He reports that currently his palpitations are only occurring every other month.  Given this, no further monitoring is indicated.  I would like to check an echo to make sure his LV function is still preserved with his apnea and  resting tachycardia.  I do not see a TSH and I will draw one when he comes for the echo.  Problem # 2:  DIASTOLIC HEART FAILURE, CHRONIC (ICD-428.32) He appears to be euvolemic.  No change in therapy is indicated. Orders: Echocardiogram (Echo) EKG w/ Interpretation (93000)  Problem # 3:  OBESITY, MORBID (ICD-278.01) He has been encouraged to look into bariatric surgery.  Dr. Elsworth Soho is helping him with this.  Weight loss is critical for his long-term well-being.  Patient Instructions: 1)  Your physician recommends that you schedule a follow-up appointment in: 12 months in [place] with Dr Percival Spanish 2)  Your physician recommends that you continue on your current medications as directed. Please refer to the Current Medication list given to you today. 3)  Your physician has requested that you have an echocardiogram.  Echocardiography is a painless test that uses sound waves to create images of  your heart. It provides your doctor with information about the size and shape of your heart and how well your heart's chambers and valves are working.  This procedure takes approximately one hour. There are no restrictions for this procedure.

## 2010-02-09 NOTE — Progress Notes (Signed)
Summary: blood platelet drop down to 59. has question   Phone Note Call from Patient Call back at Home Phone [phone]   Caller: Patient Reason for Call: Talk to Nurse Summary of Call: per pt calling , blood platelet drop down to 59. md told pt it should be around 150. pt going to hematology  in [place] . will this effect her rapid heart rate or any meds.  Initial call taken by: Neil Crouch,  February 02, 2010 10:08 AM  Follow-up for Phone Call        1/26/121/26/12--1100AM--lmtcb--nt 02/02/10--1245PM--Pt calling stating he has low platelets per his PCP in [place]--he wamted dr Laverne Hursey to know this as it may effect his fast heart rate--he is going to hematologist( DR CARB) in [place] next week--advised i would send message to dr Sherle Mello and his nurse and they will call him if they want anymore info--pt agrees Follow-up by: Leodis Sias, RN,  February 02, 2010 12:48 PM

## 2010-02-09 NOTE — Miscellaneous (Signed)
Summary: Oxygen Therapy/[facility]  Oxygen Therapy/[facility]   Imported By: Phillis Knack 02/03/2010 13:52:47  _____________________________________________________________________  External Attachment:    Type:   Image     Comment:   External Document

## 2010-02-14 ENCOUNTER — Ambulatory Visit (HOSPITAL_COMMUNITY): Payer: Medicare Other | Attending: Cardiology

## 2010-02-14 ENCOUNTER — Encounter: Payer: Self-pay | Admitting: Pulmonary Disease

## 2010-02-14 ENCOUNTER — Other Ambulatory Visit (HOSPITAL_COMMUNITY): Payer: Self-pay

## 2010-02-14 ENCOUNTER — Ambulatory Visit (INDEPENDENT_AMBULATORY_CARE_PROVIDER_SITE_OTHER): Payer: Medicare Other | Admitting: Pulmonary Disease

## 2010-02-14 DIAGNOSIS — I2789 Other specified pulmonary heart diseases: Secondary | ICD-10-CM | POA: Insufficient documentation

## 2010-02-14 DIAGNOSIS — I517 Cardiomegaly: Secondary | ICD-10-CM

## 2010-02-14 DIAGNOSIS — G4733 Obstructive sleep apnea (adult) (pediatric): Secondary | ICD-10-CM

## 2010-02-14 DIAGNOSIS — R002 Palpitations: Secondary | ICD-10-CM | POA: Insufficient documentation

## 2010-02-14 DIAGNOSIS — I2510 Atherosclerotic heart disease of native coronary artery without angina pectoris: Secondary | ICD-10-CM | POA: Insufficient documentation

## 2010-02-14 DIAGNOSIS — I059 Rheumatic mitral valve disease, unspecified: Secondary | ICD-10-CM | POA: Insufficient documentation

## 2010-02-14 DIAGNOSIS — I509 Heart failure, unspecified: Secondary | ICD-10-CM | POA: Insufficient documentation

## 2010-02-14 DIAGNOSIS — R000 Tachycardia, unspecified: Secondary | ICD-10-CM | POA: Insufficient documentation

## 2010-02-14 DIAGNOSIS — I4891 Unspecified atrial fibrillation: Secondary | ICD-10-CM | POA: Insufficient documentation

## 2010-02-14 DIAGNOSIS — E119 Type 2 diabetes mellitus without complications: Secondary | ICD-10-CM | POA: Insufficient documentation

## 2010-02-14 DIAGNOSIS — I379 Nonrheumatic pulmonary valve disorder, unspecified: Secondary | ICD-10-CM | POA: Insufficient documentation

## 2010-02-16 ENCOUNTER — Encounter: Payer: Self-pay | Admitting: Pulmonary Disease

## 2010-02-17 ENCOUNTER — Encounter: Payer: Self-pay | Admitting: Pulmonary Disease

## 2010-02-22 ENCOUNTER — Telehealth: Payer: Self-pay | Admitting: Pulmonary Disease

## 2010-02-23 NOTE — Assessment & Plan Note (Signed)
Summary: ROV/02 REQUALIFY   Visit Type:  Follow-up Primary Provider/Referring Provider:  Worthy Keeler PAc  CC:  Pt states is here to requalify for O2. Pt c/o increased SOB with exertion.  History of Present Illness: 44/M , morbidly obese for FU of  severe OSA,  on CPAP since 1/09. PSG showed AHI 78/h when his wt was 385 lbs  March 29, 2009 1:56 PM  'arthritis' in both knesss, A month ago adm to [facility], no blood clots, fluid build up  August 29, 2009 4:12 PM  PFTs do not show obstruction, FEV1 81%, TLC 76%, DLCO 70% corrects for alveolar volume download 1/5- 04/12/09  on auto 10-20 >> excellent compliance, avg pr 16, residual AHI 2.6/h wt 400 lbs ! Unclear if advair/spiriva helping  February 14, 2010 9:56 AM  Here to requalify for O2 pulmonary emboli [facility], states compliant with CPAP, uses o2 daytime when he get winded & during sleep, has leak issues when he sleeps on his side or his stomach Gained 35 lbs since PSG, hard for him to walk, considering bypass but has been unable to make it to class. Did not desaturate on walking  Preventive Screening-Counseling & Management  Alcohol-Tobacco     Smoking Status: never  Current Medications (verified): 1)  Demadex 20 Mg Tabs (Torsemide) .... Take (2)two Every Am and (1) One 6 Hours Later 2)  Bayer Aspirin 325 Mg  Tabs (Aspirin) .... Take 1 Tablet By Mouth Once A Day 3)  Alprazolam 0.5 Mg Tabs (Alprazolam) .... Take 2 Tab By Mouth At Bedtime 4)  Vicodin 5-500 Mg  Tabs (Hydrocodone-Acetaminophen) .... Take 1 Tablet By Mouth Four Times A Day 5)  Cardizem 90 Mg  Tabs (Diltiazem Hcl) .... Take 1 Tablet By Mouth Three Times A Day 6)  Klor-Con M20 20 Meq  Tbcr (Potassium Chloride Crys Cr) .... Take 2 Tablets Once Daily 7)  Colchicine 0.6 Mg  Tabs (Colchicine) .... 2 By Mouth Daily 8)  Prilosec 20 Mg Cpdr (Omeprazole) .Marland Kitchen.. 1 By Mouth Two Times A Day 9)  Glucotrol 10 Mg Tabs (Glipizide) .Marland Kitchen.. 1 By Mouth Daily 10)  Spiriva Handihaler 18 Mcg  Caps (Tiotropium Bromide Monohydrate) .... Once Daily 11)  Advair Diskus 250-50 Mcg/dose Misc (Fluticasone-Salmeterol) .... Two Times A Day 12)  Multivitamins  Tabs (Multiple Vitamin) .... Take 1 Tablet By Mouth Once A Day 13)  Ambien 10 Mg Tabs (Zolpidem Tartrate) .Marland Kitchen.. 1 By Mouth At Bedtime 14)  Meloxicam 15 Mg Tabs (Meloxicam) .... Take 1 Tablet By Mouth Once A Day 15)  Metformin Hcl 500 Mg Tabs (Metformin Hcl) .... Take 1 Tablet By Mouth Once A Day and 1 Additional As Needed 16)  Metolazone 5 Mg Tabs (Metolazone) .... As Directed 17)  Cpap .... With Ahc 18)  Oxygen .... 2 Liters At Bedtime With Cpap 19)  Proair Hfa 108 (90 Base) Mcg/act Aers (Albuterol Sulfate) .... 2 Puffs By Mouth Three Times A Day As Needed 20)  Vitamin D3 50000 Unit Caps (Cholecalciferol) .... 2 By Mouth Weekly 21)  Topamax 100 Mg Tabs (Topiramate) .Marland Kitchen.. 1 By Mouth Daily 22)  Lyrica 150 Mg Caps (Pregabalin) .Marland Kitchen.. 1 By Mouth Two Times A Day 23)  Zetia 10 Mg Tabs (Ezetimibe) .Marland Kitchen.. 1 By Mouth Daily 24)  Allopurinol 100 Mg Tabs (Allopurinol) .Marland Kitchen.. 1 By Mouth Daily 25)  Androgel 25 Mg/2.5gm Gel (Testosterone) .... Uad  Allergies (verified): No Known Drug Allergies  Past History:  Past Medical History: Last updated: 06/21/2008 CAD (  ICD-414.00) (Nonobstructive) ATRIAL FIBRILLATION (ICD-427.31) (Paroxysmal) DIASTOLIC HEART FAILURE, CHRONIC (ICD-428.32) HYPERTENSION, PULMONARY (ICD-416.8) OBESITY, MORBID (ICD-278.01) SLEEP APNEA, OBSTRUCTIVE (ICD-327.23) GOUT (ICD-274.9) DIABETES MELLITUS (ICD-250.00)    Social History: Last updated: 05/22/2008 Full Time Divorced  Tobacco Use - No.  Alcohol Use - yes - 80-140 oz  Drug Use - no  Review of Systems       The patient complains of dyspnea on exertion and peripheral edema.  The patient denies anorexia, fever, weight loss, weight gain, vision loss, decreased hearing, hoarseness, chest pain, syncope, prolonged cough, headaches, hemoptysis, abdominal pain, melena,  hematochezia, severe indigestion/heartburn, hematuria, muscle weakness, suspicious skin lesions, difficulty walking, depression, unusual weight change, abnormal bleeding, enlarged lymph nodes, and angioedema.    Vital Signs:  Patient profile:   45 year old male Height:      70 inches Weight:      423 pounds BMI:     60.91 O2 Sat:      95 % on Room air Temp:     98.0 degrees F oral Pulse rate:   88 / minute BP sitting:   126 / 80  (left arm) Cuff size:   large  Vitals Entered By: Iran Planas CMA (February 14, 2010 9:47 AM)  O2 Flow:  Room air  Serial Vital Signs/Assessments:  Comments: Ambulatory Pulse Oximetry  Resting; HR__78___    02 Sat__96&RA___  Lap1 (185 feet)   HR_108____   02 Sat_93%RA____ Lap2 (185 feet)   HR_114____   02 Sat_92%RA____    Lap3 (185 feet)   HR_113____   02 Sat_94%RA____  _X__Test Completed without Difficulty ___Test Stopped due to: Stefani Dama RCP, LPN  February 15, 7024 10:24 AM   By: Stefani Dama RCP, LPN   CC: Pt states is here to requalify for O2. Pt c/o increased SOB with exertion Comments Medications reviewed with patient Verified contact number and pharmacy with patient Iran Planas [facility]  February 14, 2010 9:47 AM    Physical Exam  Additional Exam:  wt 423 February 14, 2010  No acute distress. VSS.  HEENT: unremarkable. Oropharynx is clear. TM nml, EAC clear Neck: supple, no adenopathy, or JVD Lungs: CTA bilat. no wheezing/crackles Card: r/r/r, no m/r/g Ext: warm w/o calf tenderness, cyanosis clubbing, or tr. edema    Impression & Recommendations:  Problem # 1:  SLEEP APNEA, OBSTRUCTIVE (ICD-327.23) Has been compliant on prior download Compliance encouraged, wt loss emphasized, asked to avoid meds with sedative side effects, cautioned against driving when sleepy.  Would like to change to fixed level CPAP, suspect that he is having higher leak on auto Howvever, his wt keeps fluctuating, he has ranged from 14 cm  to 16 cm based on wt gain Orders: Est. Patient Level IV (37858) DME Referral (DME)  Problem # 2:  OBESITY, MORBID (ICD-278.01) Recheck ONO on CPAP/ RA  to see if he needs nocturnal O2 He does not seem to need this daytime. Similarly , his lung function apears adequate - restriction due to obesity - but he reports sypmptomatic benefit from LABA Orders: Est. Patient Level IV (85027) DME Referral (DME)  Medications Added to Medication List This Visit: 1)  Alprazolam 0.5 Mg Tabs (Alprazolam) .... Take 2 tab by mouth at bedtime  Patient Instructions: 1)  Copy sent to: [facility] practice 2)  Please schedule a follow-up appointment in 4 months. 3)  Ambulatory satn 4)  We will check Oxygen level during sleep to check whether you qUALIFY 5)  Weight loss most important    Immunization History:  Influenza Immunization History:    Influenza:  historical (01/09/2010)

## 2010-02-23 NOTE — Miscellaneous (Signed)
Summary: Rosezetta Schlatter MD  Rosezetta Schlatter MD   Imported By: Bubba Hales 02/17/2010 13:44:13  _____________________________________________________________________  External Attachment:    Type:   Image     Comment:   External Document

## 2010-03-01 NOTE — Progress Notes (Signed)
  Phone Note Outgoing Call   Summary of Call: Reviewed ONO - let him know that his oxygen levels stayed good during the night. Extra oxygen does not help in this situation & he does not qualify. His shortness of breath is not assocaited with low oxygen levels, hence more oxygen will not help. I cannot write an order to continue his oxygen anymore. Initial call taken by: Leanna Sato. Elsworth Soho MD,  February 22, 2010 2:24 PM  Follow-up for Phone Call        I spoke with pt and informed of above. Iran Planas CMA  February 23, 2010 9:27 AM

## 2010-03-07 NOTE — Procedures (Signed)
Summary: Oxygen Qualifying Svc  Oxygen Qualifying Svc   Imported By: Bubba Hales 03/02/2010 08:04:31  _____________________________________________________________________  External Attachment:    Type:   Image     Comment:   External Document

## 2010-03-07 NOTE — Procedures (Signed)
Summary: Oximetry/[facility]  Oximetry/[facility]   Imported By: Bubba Hales 02/28/2010 08:32:33  _____________________________________________________________________  External Attachment:    Type:   Image     Comment:   External Document

## 2010-05-10 ENCOUNTER — Encounter: Payer: Self-pay | Admitting: Physician Assistant

## 2010-05-23 NOTE — Assessment & Plan Note (Signed)
[place]                                 ON-CALL NOTE   NAME:[name], [name] QUINTANAR                     MRN:          [id number]  DATE:06/14/2008                            DOB:          10-Jun-1965    I received a page from [facility] regarding [name] being in atrial  fibrillation with a ventricular response rate around the 100.  They also  reported that the patient was having mild shortness of breath.  I called  the patient back and he reported that he had felt like he might be in  atrial fibrillation, and so he had taken an extra Cardizem per prior  discussions/agreement with his primary cardiologist, Dr. Percival Spanish.  The  patient reported some alleviation to his symptoms of shortness of breath  since doing so.  The patient also reported feeling like he put on a  little bit of weight over the last few days, but had not weighed  himself.  I instructed the patient to take 80 mg of Lasix tonight rather  than his usual 40 mg.  I also instructed him to be vigilant in terms of  his symptoms and should they worsen, to present to [facility] for  evaluation.  The patient indicated he understood these instructions.     Guss Bunde, [facility]  Electronically Signed    MS/MedQ  DD: 06/14/2008  DT: 06/15/2008  Job #: [id number]

## 2010-05-23 NOTE — Assessment & Plan Note (Signed)
[place]                            CARDIOLOGY OFFICE NOTE   NAME:Trethewey, DAL BLEW                     MRN:          [id number]  DATE:11/14/2006                            DOB:          1965-04-07    REASON FOR REFERRAL:  Evaluate patient with lower extremity edema and  paroxysmal atrial fibrillation.   HISTORY OF PRESENT ILLNESS:  The patient is a 45 year old with a history  of significant lower extremity and scrotal edema earlier this year.  He  reports being hospitalized twice at [facility].  These apparently  were both overnight stays in early September and early October.  He  apparently had diuresis.  I do have lab work that includes normal  thyroid study and B-MET late last month.  I also was able to get the  report of an echocardiogram done at [facility].  This demonstrates a  preserved ejection fraction of 50-55% but with questionable regional  wall motion abnormalities.   The patient reports that he is dyspneic with most activities.  He gets  short of breath walking a short to moderate distance on level ground.  He is not describing PND or orthopnea.  He does describe poor sleep.  He  has snoring at night and hypersomnolence during the day.  He was set up  to have a sleep study but could not afford to have this done.  The  patient has been managed with Lasix and spironolactone and has had a  good diuresis, though he is not nearly at baseline.  He has been in no  acute respiratory distress.  He did have an EKG that I see documented at  [facility] with atrial fibrillation.  The EKG done this morning  at Dr. Murrell Redden office demonstrated a normal sinus rhythm.  The patient  denies any chest discomfort, neck or arm discomfort.  He has not noted  any palpitations, presyncope, or syncope.   PAST MEDICAL HISTORY:  1. Hypertension x8 years.  (The patient has not been able to afford      his blood pressure medicines).  2. Gout.   PAST SURGICAL HISTORY:  1. Hernia surgery.  2. Tonsillectomy.   ALLERGIES:  None.   MEDICATIONS:  1. Colchicine 0.6 mg b.i.d.  2. Tramadol.  3. Furosemide 80 mg daily.  4. Spironolactone 25 mg (the patient is not sure whether he is taking      this once or twice a day).  5. Aspirin 325 mg p.r.n.   SOCIAL HISTORY:  The patient is divorced.  He has one child.  He chews  tobacco but does not smoke cigarettes.  He occasionally drinks alcohol.   FAMILY HISTORY:  Noncontributory for early coronary artery disease.  There is a very strong family history of cancer.   REVIEW OF SYSTEMS:  As stated in the HPI.  Positive for colitis, reflux.  Negative for other systems.   PHYSICAL EXAMINATION:  GENERAL:  The patient is in no acute distress.  VITAL SIGNS:  Blood pressure 159/100, heart rate 102 and regular.  HEENT:  Eyelids unremarkable.  Pupils are equal,  round, and reactive to  light.  Fundi not visualized.  Oral mucosa unremarkable.  NECK:  No obvious jugular venous distention.  Wave form within normal  limits.  Carotid upstroke brisk and symmetric.  No bruits.  No  thyromegaly.  LYMPHATICS:  No cervical, axillary, or inguinal adenopathy.  LUNGS:  Clear to auscultation bilaterally.  BACK:  No costovertebral angle tenderness.  CHEST:  Unremarkable.  HEART:  PMI not displaced or sustained.  S1 and S2 within normal limits.  Positive S3.  No S4, no clicks, no rubs, no murmurs.  ABDOMEN:  Morbidly obese, positive bowel sounds, normal in frequency and  pitch.  No rebound or guarding.  It is very difficult to assess but I do  not appreciate any hepatomegaly, splenomegaly, midline pulsatile mass or  bruit.  SKIN:  No rashes.  No nodules.  EXTREMITIES:  Diffuse lower extremity edema, moderate.  No cyanosis.  No  clubbing.  NEUROLOGIC:  Oriented to person, place and time.  Cranial nerves II-XII  grossly intact.  Motor grossly intact.   ASSESSMENT/PLAN:  1. Lower extremity edema.  The  patient has a well preserved left      ventricular function with questionable regional wall motion      abnormalities.  He is not having any anginal symptoms.  He is      improving with oral diuretics.  I think we can continue this      regimen though we are going to need significant education.  He is      going to need salt and fluid restriction.  He is going to need      management of his CPAP and his weight as described below.  No      further cardiovascular testing is planned at this point, though I      will consider perfusion imaging in the future, given the question      of regional wall motion abnormalities.  2. Sleep apnea.  I think this is probably the correct diagnosis though      he has not had the sleep study.  He cannot afford to have this.  I      encouraged him to follow up with social services, so that we can      get some help with getting this diagnosis and treatment.  3. Obesity.  This is his most significant problem.  I discussed this      with him in detail.  He needs to loose weight by cutting calories.      Hopefully, he can comply with this.  I do not expect any      significant improvement without that.  4. Hypertension.  He is hypertensive and I have taken the liberty of      giving him Lisinopril 5 mg daily.  His B-MET is fine.  He is going      to get written instructions to get another B-MET in about 2 weeks.      He understands the potential side effects.  I suspect that his      blood pressure is also related to his sleep apnea.   FOLLOWUP:  I will see the patient again in about 4 weeks or sooner if  needed.     Minus Breeding, MD, [facility]  Electronically Signed    JH/MedQ  DD: 11/14/2006  DT: 11/14/2006  Job #: [id number]   cc:   Margarita Rana, M.D.

## 2010-05-23 NOTE — Cardiovascular Report (Signed)
NAME:  [name], [name]              ACCOUNT NO.:  [id number]   MEDICAL RECORD NO.:  [id number]          PATIENT TYPE:  INP   LOCATION:  2030                         FACILITY:  [facility]   PHYSICIAN:  Loretha Brasil. Lia Foyer, MD, FACCDATE OF BIRTH:  [birth year]   DATE OF PROCEDURE:  01/13/2007  DATE OF DISCHARGE:                            CARDIAC CATHETERIZATION   INDICATIONS:  Mr. Anna is a 45 year old gentleman who presents with  shortness of breath, dyspnea and some chest discomfort.  The patient has  an abnormal echo with multiple wall motion abnormalities, he has  probable sleep apnea with morbid obesity.  He was seen by Dr. Percival Spanish,  who is his primary cardiologist, and referred for diagnostic right and  left heart catheterization.   PROCEDURE:  1. Left and right heart catheterization.  2. Selective coronary arteriography.  3. Selective left ventriculography.   DESCRIPTION OF THE PROCEDURE:  The patient was brought to the Cath Lab  and prepped and draped in the usual fashion.  We had to use a Smart  needle to find access to the femoral vein.  It was very deep.  We were  able to get access and a 7-French sheath was placed.  A 7.5 French  thermodilution Swan-Ganz catheter was then passed without difficulty to  the superior vena cava where saturations were obtained.  Following this,  serial right heart pressures were recorded.  Pulmonary artery saturation  was then obtained.  Thermodilution cardiac outputs were then performed.  Following this, we then entered the right femoral artery, and a 6-French  sheath was placed.  This was using a Smart needle as well.  Views of the  left and right coronary arteries were then obtained with standard  Judkins catheters.  He tolerated this well.  Central aortic and left  ventricular pressures were measured with a pigtail.  Simultaneous wedge  LV was also obtained.  Following this, simultaneous RV LV was obtained  to exclude restrictive  cardiomyopathy.  Following this, a  ventriculography was performed in the RAO projection and a pressure  pullback performed.  The right heart catheter was removed as was the  left heart catheter.  He was subsequently taken to the holding area in  satisfactory clinical condition.   HEMODYNAMIC DATA:  1. Superior vena cava saturation 59%.  2. Pulmonary artery saturation 61%.  3. Aortic saturation 90%.  4. Right atrial pressure 23.  5. RV pressure 44/23.  6. Pulmonary artery pressure 41/31.  7. Pulmonary capillary wedge 28.  8. Aortic 152/110, mean 130.  9. LV 150/27.  10.Fick cardiac output 5.3 liters per minute.  11.Fick cardiac index 2.0 liters per minute per meter squared.  12.Thermodilution cardiac output 9.5 liters per minute.  13.Thermodilution cardiac index 3.6 liters per minute per meter      squared.   ANGIOGRAPHIC DATA:  1. Ventriculography was done in the RAO projection.  Opacification was      somewhat difficult.  There appeared to be perhaps mild global      hypokinesis with an estimate of the ejection fraction in the range  of about 50%.  There was no obvious mitral regurgitation during      systolic contraction.  No focal definite wall motion abnormalities      were identified.  2. The left main is free of critical disease.  3. The left anterior descending artery courses to the apex and      provides a very large diagonal branch.  There may be minimal      luminal irregularity of the LAD distally, but no evidence of high-      grade focal obstruction.  4. There is a ramus intermedius vessel that has minimal 20-30% mid      vessel irregularity at the takeoff of 2 side branches, but no      evidence of high grade obstruction.  The distal vessel was without      critical disease.  5. The AV circumflex is a dominant vessel providing a posterolateral      and posterior descending branch.  This appears free of critical      disease.  6. The right coronary artery is  nondominant without significant focal      obstruction.   CONCLUSIONS:  1. Mild global hypokinesis without a definite wall motion abnormality.  2. No critical coronary obstruction.  3. Pulmonary hypertension, probably on the basis of obstructive sleep      apnea.  4. Morbid obesity.   DISPOSITION:  The patient will follow with Dr. Percival Spanish.  More than  anything else, he needs weight loss to relieve the obstruction.      Loretha Brasil. Lia Foyer, MD, [facility]  Electronically Signed     TDS/MEDQ  D:  01/13/2007  T:  01/13/2007  Job:  [id number]   cc:   Ernestine Mcmurray, MD,FACC  Margarita Rana, M.D.  Janelle Floor, M.D.  Minus Breeding, MD, [facility]

## 2010-05-23 NOTE — Assessment & Plan Note (Signed)
[facility]                            CARDIOLOGY OFFICE NOTE   [name], [name]                     MRN:          [id number]  DATE:01/30/2007                            DOB:          Nov 13, 1965    PRIMARY CARE PHYSICIAN:  Margarita Rana, M.D.   REASON FOR PRESENTATION:  Evaluate patient with recent hospitalization  for dyspnea.   HISTORY OF PRESENT ILLNESS:  The patient was hospitalized with dyspnea  on January 10, 2007.  He subsequently had a cardiac catheterization.  This demonstrated normal coronary arteries.  His pulmonary pressure was  slightly elevated at 41/31 with a wedge of 28.  He was managed with  diuresis.  He was also seen by pulmonary.  He was treated with  bronchodilators and a course of steroids.  He was to go home on Avelox,  but could not afford this.  He did complete the steroids.  He also had a  sleep study, and he tells me that the results demonstrated severe sleep  apnea.  He is due to follow up with Dr. Dagmar Hait.  He is being treated with  CPAP.   He says with all of this, he feels much better.  He is not having the  cough that he had, although he has hoarseness.  He is breathing about  the same, but not in any acute distress.  He is not describing any PND  or orthopnea.  He has less tiredness.  He has less swelling than he used  to have.  He is trying to comply with weight, salt, and fluid  restriction.   Of note, he continued to take lisinopril though this was discontinued.  He ran out of Colchicine and wonders if he is supposed to continue this.  Again as noted, he did not take his Avelox.  He could not afford the  Protonix.  He did not get any Advair.   PAST MEDICAL HISTORY:  1. Diastolic heart failure.  2. Nonobstructive coronary disease.  3. Mild pulmonary hypertension.  4. Sleep apnea.  5. Brief episode of paroxysmal atrial fibrillation.  6. Morbid obesity.  7. Tobacco use.  8. Gout.   ALLERGIES:   None.   MEDICATIONS:  1. Colchicine 0.6 mg daily.  2. Furosemide 80 mg daily.  3. Glucophage 500 mg b.i.d.  4. Aspirin 81 mg daily (he was supposed to be taking Advair, Avelox      and Protonix.)   REVIEW OF SYSTEMS:  As stated in the HPI.  Positive for a rash with  apparent psoriasis  Negative for other systems.   PHYSICAL EXAMINATION:  The patient is in no acute distress.  Blood  pressure:  110/70.  Heart rate:  117, regular.  NECK:  No jugular venous distension at 45 degrees, though this is  difficult exam.  Carotid upstroke brisk and symmetric.  No bruits.  No  thyromegaly.  LYMPHATICS:  No adenopathy.  LUNGS:  Clear to auscultation without wheezing or crackles.  BACK:  No costovertebral angle tenderness.  CHEST:  Unremarkable.  HEART:  PMI not displaced or sustained.  S1, S2 within normal limits.  No S3, S4.  No clicks, rubs, murmurs.  ABDOMEN:  Morbidly obese.  Positive bowel sounds.  Normal in frequency  and pitch.  No bruits.  No rebound, no guarding.  This is difficult exam  with his size.  SKIN:  No rashes.  No nodules.  EXTREMITIES:  2+ pulse.  Mild bilateral lower extremity edema.   EKG:  Sinus tachycardia.  Rate 108.  Axis within normal limits.  Intervals within normal limits.  No acute ST-T wave changes.   ASSESSMENT/PLAN:  1. Hoarseness.  This is a predominant complaint of the patient.  He      was felt to have reflux.  We will look for a generic proton pump      inhibitor or H2 blocker to give him.  He is going to follow up with      Dr. Dagmar Hait.  2. Sleep apnea.  He is wearing his CPAP.  He has followup with Dr.      Dagmar Hait.  He will also address the inability to get the antibiotic and      the Advair.  I do not think he is acutely infected, so I will not      try to prescribe an antibiotic  at this point.  3. He is losing weight and I encouraged more of the same.  He      understands this is a significant part of his health problem.  4. Diastolic heart failure.   He seems to be euvolemic.  He will      continue medications as listed.  I will get a BMET today since he      did not have this done as he could not afford it.  5. Followup.  He will be back in about six months or sooner if needed.     Minus Breeding, MD, [facility]  Electronically Signed    JH/MedQ  DD: 01/30/2007  DT: 01/30/2007  Job #: [id number]   cc:   Margarita Rana, M.D.

## 2010-05-23 NOTE — Assessment & Plan Note (Signed)
[name]                            CARDIOLOGY OFFICE NOTE   [name], [name]                     MRN:          [id number]  DATE:04/07/2007                            DOB:          11-10-1965    PRIMARY:  Dr. Dione Housekeeper.   REASON FOR PRESENTATION:  Evaluate the patient with dyspnea and  palpitations.   HISTORY OF PRESENT ILLNESS:  The patient is 45 years old.  He has a  history of diastolic heart failure.  He has sleep apnea and recently has  started using a CPAP.  He feels much better, and he has been able to  work.  However, he has noticed significant right groin pain since his  catheterization.  This has been worse since he has been working.  It  feels like his muscle is sore.  He has never had any bleeding or severe  bruising down there.  He is not having any leg or calf pain.  He has had  some pain down in his hip joints, he thinks.  He has also had  palpitations.  He feels this mostly after dinner.  He has had a couple  of episodes that have caused him to go to [name].  There, he was given  a shot of something.  He was sent home and told to see his  cardiologist.  I do not what dysrhythmia was identified.  The 2nd time  he came to the hospital, it was suggested that he be admitted, but he  needed to get out and go to work, and so he received no therapy.  I do  not have any records from [name].  He has not had any presyncope or  syncope.  He is not describing any chest discomfort, neck or arm  discomfort.  He has not been having any PND or orthopnea, though he is  still dyspneic with moderate to mild exertion.  He says that despite  cutting back on his calories and eating the right things, he has gained  20 pounds since reducing his Lasix from 120 to 80 mg a day.   PAST MEDICAL HISTORY:  1. Diastolic heart failure.  2. Nonobstructive coronary disease.  3. Mild pulmonary hypertension.  4. Sleep apnea.  5. Brief episode of  paroxysmal atrial fibrillation in the past.  6. Morbid obesity.  7. Tobacco use.  8. Gout.   ALLERGIES:  None.   MEDICATIONS:  1. Colchicine 0.6 mg daily.  2. Furosemide 80 mg daily.  3. Glucophage 5 mg b.i.d.  4. Aspirin 81 mg daily.  5. Lisinopril 5 mg daily.   REVIEW OF SYSTEMS:  As stated in the HPI and otherwise negative for  other systems.   PHYSICAL EXAMINATION:  The patient is in no distress.  VITALS:  Blood pressure 140/102, heart rate 84 and regular.  HEENT:  Eyes unremarkable; pupils equal, round, and react to light;  fundi not visualized; oral mucosa unremarkable.  NECK:  No jugular distention, though it is very difficult to examine  because of his size.  Carotid upstroke brisk and symmetrical.  No  bruits.  LYMPHATICS:  No cervical, axillary, or inguinal adenopathy.  LUNGS:  Clear to auscultation bilaterally.  BACK:  No costovertebral angle mass.  CHEST:  Unremarkable.  HEART:  PMI not displaced or sustained, S1-S2 within normal limits, no  Q7-Y1, no clicks, rubs, no murmurs.  ABDOMEN:  Morbidly obese, positive bowel sounds normal in frequency  pitch, no bruits, rebound, guarding or midline pulsatile mass or  organomegaly.  SKIN:  No rashes, no nodules.  EXTREMITIES:  2+ pulse, mild bilateral lower extremity edema.  NEURO:  Oriented to person, place, and time, cranial nerves 2-12 grossly  intact, motor grossly intact.   ASSESSMENT/PLAN:  Groin pain.  I do not feel any pulsatile mass or hear  any bruits.  However, he says he has been having this pain since his  catheterization, so we will order a groin ultrasound to make sure there  is no pseudoaneurysm or arteriovenous fistula.  1. Weight gain.  This may well be related to diastolic dysfunction and      volume.  I will believe him when he says he has cut back on his      calories; yet he continues to gain weight.  I am going to increase      his Lasix back to 120 mg daily.  He is given written instructions       to come back in less than 2 weeks for a BMET.  2. Palpitations.  This may be fibrillation.  I am going to going to go      ahead and apply a 2-week event monitor.  3. Hypertension.  We will manage this with a higher dose of diuretic.      Hopefully, weight loss.  Continued sleep apnea.  We will titrate      his lisinopril as needed.  4. Morbid obesity.  Again, he is encouraged to continue his diet to      lose weight.  5. Followup.  I will see the patient back after the results of the      above.     Minus Breeding, MD, [facility]  Electronically Signed    JH/MedQ  DD: 04/07/2007  DT: 04/07/2007  Job #: [id number]   cc:   Margarita Rana, M.D.

## 2010-05-23 NOTE — Assessment & Plan Note (Signed)
[facility]                            CARDIOLOGY OFFICE NOTE   [name], [name]                     MRN:          [id number]  DATE:05/13/2007                            DOB:          07-02-1965    PRIMARY CARE PHYSICIAN:  Margarita Rana, M.D.   REASON FOR PRESENTATION:  Evaluate patient with paroxysmal atrial  fibrillation.   HISTORY OF PRESENT ILLNESS:  The patient is a 45 year old gentleman with  dyspnea and diastolic heart failure.  He recently wore an event monitor  to evaluate palpitations.  He was noted to have paroxysmal atrial  fibrillation and was actually seen in the emergency room.  He was  started on Cardizem.  He converted to sinus rhythm.  He was sent home on  the Cardizem.  He did notice tachypalpitations.  However, he has had no  further episodes of this.  He has had no presyncope or syncope.  He is  very limited currently by a gout flare.  He has morbid obesity and has  had progressive weight gain despite what he describes as a very limited  diet.  He has had some increased abdominal swelling and lower extremity  swelling.  He also is limited by chronic dyspnea, though this has been  extensively evaluated.  It is felt to be related to hypoventilation  obesity syndrome and some diastolic heart failure.  He also has some  mild pulmonary hypertension.   Of note, the patient has had close follow up recently of blood work as  he has had some hypokalemia.   PAST MEDICAL HISTORY:  1. Diastolic heart failure.  2. Nonobstructive coronary disease.  3. Mild pulmonary hypertension.  4. Sleep apnea.  5. Paroxysmal atrial fibrillation.  6. Morbid obesity.  7. Smokeless tobacco use, gout.   ALLERGIES:  None.   MEDICATIONS:  1. Colchicine 0.6 mg daily.  2. Glucophage 500 mg b.i.d.  3. Lisinopril 5 mg daily.  4. Potassium 40 mEq daily.  5. Furosemide 120 mg daily.  6. Aspirin 325 mg daily.  7. Xanax.  8. Cardizem 90 mg  t.i.d.   REVIEW OF SYSTEMS:  As stated in the HPI and otherwise negative for  other systems.   PHYSICAL EXAMINATION:  GENERAL:  The patient is in no distress.  VITAL SIGNS:  Blood pressure 126/76, heart rate 91 and regular, weight  412 pounds.  HEENT:  Eyelids are unremarkable.  Pupils equal, round and reactive to  light.  Fundi not visualized.  Oral mucosa unremarkable.  NECK:  Unable to assess his jugular veins, as he is so large.  His  carotid upstrokes are brisk and symmetric.  There are no bruits.  There  is no obvious thyromegaly.  LYMPHATICS:  No adenopathy.  LUNGS:  Clear to auscultation bilaterally.  BACK:  No costovertebral angle tenderness.  CHEST:  Unremarkable.  HEART:  PMI is not appreciated with  his size.  S1 and S2 within normal.  No O1-L5, no clicks, no rubs, no murmurs.  ABDOMEN:  Morbidly obese, positive bowel sounds.  Normal in frequency  and pitch.  No  bruits, no rebound, no guarding, no midline pulsatile  mass or organomegaly.  SKIN:  No rashes, no nodules.  EXTREMITIES:  2+ pulses.  Moderate bilateral lower extremity pitting  edema.  NEUROLOGIC:  Oriented to person, place and time.  Cranial nerves II-XII  grossly intact.  Motor grossly intact throughout.   ELECTROCARDIOGRAM:  Sinus rhythm, rate 85, axis within normal limits,  intervals within normal limits, no acute ST-T wave changes.   ASSESSMENT/PLAN:  1. Paroxysmal atrial fibrillation.  The patient had this documented in      the ER and on his rhythm strip.  Since starting the Cardizem.  He      has not had any recurrence of this.  He does not have a high CHADs      score and so does require anticoagulation.  He will remain on the      current dose of Cardizem.  2. Dyspnea.  This is a limiting problem for this patient.  It is      multifactorial as above and greatly related to his weight.  It is a      limiting him in his activities, and he is trying to qualify for      disability.  I would agree  that he is disabled in his current      state.  He will remain on the medicines as listed.  3. Obesity.  We had a long discussion about this in the office.  He is      killing himself with his weight.  He absolutely needs to restrict      his calories.  He describes some habits that would appear that he      is trying to comply with this, but I think there is some lack of      understanding.  I went over in careful detail calorie counting and      what he needs to do and pointed out that he will not do well if he      continues to gain weight.  4. Lower extremity swelling.  I am hesitant to give him any further      diuretics, as he has had some low potassium and problems      maintaining this.  He will remain on the medicines as listed.  5. Gout per his primary care doctor.  6. Sleep apnea.  He will continue with the CPAP.  7. Follow up.  Will see him back in about 4 months or sooner if      needed.     Minus Breeding, MD, [facility]  Electronically Signed    JH/MedQ  DD: 05/14/2007  DT: 05/14/2007  Job #: [id number]   cc:   Margarita Rana, M.D.

## 2010-05-23 NOTE — Discharge Summary (Signed)
NAME:  [name], [name]              ACCOUNT NO.:  [id number]   MEDICAL RECORD NO.:  [id number]          PATIENT TYPE:  INP   LOCATION:  2030                         FACILITY:  [facility]   PHYSICIAN:  Minus Breeding, MD, FACCDATE OF BIRTH:  October 31, 1965   DATE OF ADMISSION:  01/10/2007  DATE OF DISCHARGE:  01/14/2007                               DISCHARGE SUMMARY   PRIMARY CARDIOLOGIST:  Minus Breeding, MD, [facility].   PRIMARY CARE PHYSICIAN:  Margarita Rana, M.D.   CONSULTING PHYSICIAN:  Rigoberto Noel, M.D.   PROCEDURES PERFORMED DURING HOSPITALIZATION:  Right and left cardiac  catheterization, completed by Dr. Addison Lank on January 13, 2007.   FINAL DISCHARGE DIAGNOSES:  1. Diastolic dysfunction.  2. Nonobstructive coronary artery disease.  3. Mild secondary pulmonary arterial hypertension in the setting of      left ventricular dysfunction and volume overload.  4. Chronic lower extremity edema.  5. Paroxysmal atrial fibrillation.  6. Morbid obesity.  7. Sleep apnea.  8. Tobacco abuse.   HOSPITAL COURSE:  This is a 45 year old morbidly obese male patient who  was originally seen at [facility] by Dr. Dannielle Burn secondary to  worsening shortness of breath, weight gain, and persistent lower  extremity edema.  The patient was followed by Dr. Dannielle Burn at [facility] and  treated with IV Lasix, steroids, and nebulizer treatment and did have a  pulmonary consultation there.  The patient was ruled out for an MI.  He  did have some transient atrial fibrillation and sinus bradycardia during  that hospitalization with a rate of 36 beats per minute.  Echocardiogram  revealed diastolic dysfunction and some wall motion abnormalities.  Secondary to cardiovascular risk factors, the patient was transferred on  January 10, 2007 for right and left heart cath and continued pulmonary  evaluation.   The patient did come to [facility] on January 10, 2007 and was seen by  Dr. Terald Sleeper and also by Dr.  Elsworth Soho.  The patient did undergo continued  IV diuresis with Lasix 80 mg IV q.12h.  The patient was also started on  Avelox and IV Solu-Medrol.  The patient continued to diurese, although  there was no evidence of CHF, and his BNP was found to be normal.  The  patient was also started on Xopenex treatments and continued to improve  in his breathing status.  The patient did have cardiac catheterization  planned once he became stable, and heart cath was performed, as  discussed.  Please see cardiac catheterization report for more details.   The patient recovered well and was continued on pulmonary critical care  evaluation as well and began to have decrease in Solu-Medrol to p.o.  with titration down.  The patient was also found to have some worsening  reflux and was started on proton pump inhibitor 40 mg b.i.d.  The  patient's dyspnea was found to be multifactorial with diastolic  dysfunction and sleep apnea playing its part.  The patient was changed  from IV Lasix to p.o. Lasix 80 mg once daily and is to follow up as an  outpatient with  Dr. Elsworth Soho for sleep study.  In the interim, the patient  will be placed on CPAP and Solu-Medrol dose pack with titration down  along with Avelox.  The patient has also been scheduled for a sleep  study as an outpatient.   DISCHARGE LABS:  Sodium 138, potassium 4.2, chloride 104, CO2 28, BUN  26, creatinine 1.15.  Hemoglobin 15.5, white blood cells 5.9, platelets  114.   PHYSICAL EXAMINATION:  Blood pressure 114/77, heart rate 95,  respirations 18, temperature 97.1.  O2 sat 95% on 2 liters.   DISCHARGE MEDICATIONS:  1. Lasix 80 mg p.o. daily.  2. Guaifenesin 600 mg b.i.d.  3. Avelox 400 mg once daily x5 days.  4. Glucophage 500 mg twice daily.  5. Advair 250/50 1 puff twice daily.  6. Protonix 40 mg twice daily.  7. Aspirin 81 mg daily.  8. Medrol Dosepak beginning at 60 mg, titrating 4 mg each day until      completed.  9. Tramadol p.r.n.    ALLERGIES:  No known drug allergies.   FOLLOW-UP PLANS/APPOINTMENTS:  1. Patient is scheduled for a sleep study through Dr. Bari Mantis office on      January 12th, which is a previously scheduled appointment.  2. The patient is scheduled to see Dr. Minus Breeding on January 22nd      at 2:45 p.m. for continued cardiac management.  3. Patient is to see Dr. Elsworth Soho on February 16th at 1:30 p.m. for      continued pulmonary management.  4. Patient is to follow up with Dr. Edrick Oh, primary care physician,      for continued medical management.  5. Patient is scheduled for a BMET in one week, status post discharge.   Time spent with the patient, to include physician time, 50 minutes.      Phill Myron. Purcell Nails, NP      Minus Breeding, MD, [facility]  Electronically Signed    KML/MEDQ  D:  01/14/2007  T:  01/14/2007  Job:  [id number]   cc:   Margarita Rana, M.D.  Rigoberto Noel, MD

## 2010-05-23 NOTE — Assessment & Plan Note (Signed)
[facility]                            CARDIOLOGY OFFICE NOTE   NAME:[name], [name]                     MRN:          [id number]  DATE:12/13/2006                            DOB:          11/09/1965    PRIMARY CARE PHYSICIAN:  Margarita Rana, M.D.   REASON FOR PRESENTATION:  Evaluate patient with lower extremity edema  and paroxysmal atrial fibrillation.   HISTORY OF PRESENT ILLNESS:  The patient presents for follow-up of the  above.  At the last visit I did no further cardiovascular studies but  continued him on diuretics.  He has had less swelling since then.  He  thinks he has had improvement in his lower extremity and his scrotal  swelling, though it is still significant.  He still reports dyspnea that  is unchanged.  Unfortunately, he did not get his blood work done as I  had suggested.  Cost is a significant concern.  He is actually out of  his spironolactone currently.   PAST MEDICAL HISTORY:  1. Hypertension x8 years.  2. Gout.  3. Paroxysmal atrial fibrillation.  4. Hernia surgery.  5. Tonsillectomy.   ALLERGIES:  None.   MEDICATIONS:  1. Lisinopril 5 mg daily.  2. Furosemide 80 mg daily.  3. Aspirin 325 mg daily p.r.n.   REVIEW OF SYSTEMS:  As stated in the HPI and negative for other systems.   PHYSICAL EXAMINATION:  The patient is in no acute distress.  Blood pressure 123/88, heart rate 98 and regular, weight 395 pounds,  body mass index 52.  NECK:  No obvious jugular venous distention at 45 degrees.  LUNGS:  Clear to auscultation bilaterally.  HEART:  PMI not displaced or sustained.  S1 and S2 within normal limits,  no S3, no S4.  No clicks, no rubs, no murmurs.  ABDOMEN:  Morbidly obese.  EXTREMITIES:  2+ pulses, moderate bilateral lower extremity edema.  NEUROLOGIC:  Grossly intact.   Today I am going to increase his Lasix to 80 mg and 40 mg.  He will  remain on the spironolactone.  He is going to get a BMET today  while he  is here.  He knows how important it is to get his blood work checked as  directed.  He knows he could have sudden cardiac death if he has  potassium abnormalities that are severe.  He needs to have salt and  fluid restriction.  I do not know if he complies with this.  He needs  CPAP but he is not able to afford this.  He absolutely needs weight loss  but, again, I do not think he will comply with this.   FOLLOW-UP:  We will see him back in about 2 months in our office or  sooner if needed.  Again, he is going to get written instructions to get  a BMET done in one week in addition to the labs today.     Minus Breeding, MD, [facility]  Electronically Signed    JH/MedQ  DD: 12/13/2006  DT: 12/13/2006  Job #: [id number]   cc:  Margarita Rana, M.D.

## 2010-05-23 NOTE — Assessment & Plan Note (Signed)
[place] OFFICE NOTE   [name], [name]                     MRN:          [id number]  DATE:09/11/2007                            DOB:          06/26/1965    PRIMARY CARE PHYSICIAN:  Margarita Rana, MD   REASON FOR PRESENTATION:  Evaluate the patient with atrial fibrillation  and diastolic heart failure.   HISTORY OF PRESENT ILLNESS:  The patient is 45 years old.  He presents  for followup of the above problems.  He has actually lost at least 17  pounds since I last saw him.  He is feeling better.  He has had some  problems with gout, but he has had this treated.  He is able to do a  little more walking.  He is breathing better.  He has not had any  palpitations, presyncope or syncope.  He does not have any PND or  orthopnea.   PAST MEDICAL HISTORY:  Diastolic heart failure, nonobstructive coronary  artery disease, mild pulmonary hypertension, sleep apnea, paroxysmal  atrial fibrillation, morbid obesity, gout, smoke, and tobacco use.   ALLERGIES:  None.   MEDICATIONS:  1. Glucophage 500 mg b.i.d.  2. Potassium 40 mEq daily.  3. Furosemide 80 every morning and 40 mg every evening.  4. Aspirin 325 mg daily.  5. Cardizem 90 mg t.i.d.   REVIEW OF SYSTEMS:  As stated in the HPI and otherwise negative for  other systems.   PHYSICAL EXAMINATION:  GENERAL:  The patient is in no distress.  VITAL SIGNS:  Blood pressure 106/71, heart rate 81 and regular, body  mass index greater than 45, and weight 395 pounds.  NECK:  No jugular venous distension at 45 degrees, (exam compromised by  size); carotid upstroke brisk and symmetrical; no bruits; no  thyromegaly.  LUNGS:  Clear to auscultation bilaterally.  HEART:  PMI not displaced or sustained; S1 and S2 within normal limits;  no S3, no S4; no murmurs.  ABDOMEN:  Obese; positive bowel sounds, normal in frequency and pitch;  no bruits, no rebound, no  guarding; no midline pulsatile mass; no  organomegaly.  SKIN:  No rashes, no nodules.  EXTREMITIES:  2+ pulses; trace bilateral lower extremity edema.  NEURO:  Grossly intact.   EKG sinus rhythm, rate 81, axis within normal limits, intervals within  normal limits, and other than borderline first-degree AV block, no acute  ST-T wave changes.   ASSESSMENT AND PLAN:  1. Diastolic heart failure.  He seems to be euvolemic.  He is      breathing better.  No further cardiovascular testing is suggested.      He understands salt restriction.  2. Obesity.  I am proud of him for losing weight.  I would like to get      him down to at least 250.  3. Atrial fibrillation.  He has had no symptomatic paroxysms of this.      He has Mali score 0 at this point.  He does not need Coumadin.  He      will  remain on the Cardizem for rate control.  4. Diabetes, per Dr. Edrick Oh.  5. Followup.  I will see him back in 6 months or sooner if needed.     Minus Breeding, MD, [facility]  Electronically Signed    JH/MedQ  DD: 09/11/2007  DT: 09/12/2007  Job #: [id number]   cc:   Margarita Rana, M.D.

## 2010-05-23 NOTE — H&P (Signed)
NAME:  [name], [name]              ACCOUNT NO.:  [id number]   MEDICAL RECORD NO.:  [id number]          PATIENT TYPE:  EMS   LOCATION:  [facility]                         FACILITY:  [facility]   PHYSICIAN:  Shaune Pascal. Bensimhon, MDDATE OF BIRTH:  22-May-1965   DATE OF ADMISSION:  04/17/2007  DATE OF DISCHARGE:  04/17/2007                              HISTORY & PHYSICAL   PRIMARY CARDIOLOGIST:  Minus Breeding, MD.   PRIMARY CARE:  Dione Housekeeper, MD.   [name] is a 45 year old Caucasian gentleman with known history of  diastolic heart failure, nonobstructive coronary artery disease by cath,  extreme morbid obesity, sleep apnea with recent initiation of CPAP, and  history of paroxysmal atrial fibrillation.  He saw Dr. Percival Spanish on March  30th with complaints of palpitations.  It was felt to be possibly atrial  fibrillation.  The patient was placed on a 2-week event monitor.  Today,  the patient experienced some palpitations.  Apparently the office was  notified that he was in atrial fibrillation at a rapid ventricular rate.  I am told it was reportedly around 150, although I do not have any  rhythm strips to confirm this.  The patient was told to come to the  emergency room by Dr. Percival Spanish nurse.   Currently, the patient is in normal sinus rhythm on telemetry in the  emergency room.  A 12-lead EKG showing sinus rhythm at a rate of 101.  [name] states he feels fine now.  He has had no further  palpitations since being here.  He is sitting on the ER stretcher  currently dipping snuff.   PAST MEDICAL HISTORY:  1. Diastolic CHF.  2. Obstructive sleep apnea with recent initiation of CPAP.  3. Cardiac catheterization January of this year showed noncritical      disease, pulmonary hypertension with mild global hypokinesis.  4. History of paroxysmal atrial fibrillation.  5. Morbid obesity.  Ongoing tobacco use, EF of 50% by catheterization.   ALLERGIES:  NO KNOWN DRUG ALLERGIES.   CURRENT MEDICATIONS:  1. Lisinopril 5.  2. Glucophage b.i.d.  3. Colchicine.  4. Lasix 80.  5. Aspirin 81.  6. Xanax 0.25.   SOCIAL HISTORY:  The patient lives in [place] with a friend.  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  He denies any ETOH, drug or  herbal medication use.  Very sedentary lifestyle when not doing home  improvement work.  He dips snuff as stated above.   FAMILY HISTORY:  Unknown at this time.   REVIEW OF SYSTEMS:  Positive for dyspnea on exertion, palpitations,  multiple, noncardiac problems including myalgia, arthralgia and pain in  hips, knees.  All other systems reviewed and negative.   PHYSICAL EXAMINATION:  VITAL SIGNS:  Temperature 97.2, heart rate  currently 79, respirations 26, blood pressure 120/77, sat 95% on room  air.  GENERAL:  He is in no acute distress.  HEENT:  Normal.  NECK:  Supple.  Unable to evaluate for JVD secondary to body habitus.  No bruits. CARDIOVASCULAR:  Exam distant S1-S2.  No obvious murmurs,  rubs or gallops.  LUNGS: Clear to auscultation.  SKIN:  Warm and dry.  ABDOMEN:  Soft, nontender, obese, positive bowel sounds.  LOWER EXTREMITIES:  Trace of edema.  NEUROLOGICALLY:  Alert and oriented x3.   EKG showing sinus tachycardia at a rate of 101.   IMPRESSION:  1. Paroxysmal atrial fibrillation with rapid ventricular response,      relatively asymptomatic.  The patient currently in sinus rhythm.      Dr. Pierre Bali has been in to examine and assess the patient.      The patient is going to be started on Cardizem and discharged home      to follow-up with Dr. Percival Spanish.  The patient states he cannot      afford Cardizem CD 240 mg.  It was called around to several      pharmacies.  The plan will be to do Cardizem 90 mg one p.o. t.i.d.      He can get 180 tablets for 10 dollars at [facility].  Also increase      his aspirin to 325 mg daily.      Rosanne Sack, ACNP      Shaune Pascal. Bensimhon, MD   Electronically Signed    MB/MEDQ  D:  04/17/2007  T:  04/18/2007  Job:  [id number]

## 2010-05-23 NOTE — Assessment & Plan Note (Signed)
[place] OFFICE NOTE   [name], [name]                     MRN:          [id number]  DATE:02/17/2008                            DOB:          1965/04/04    PRIMARY CARE PHYSICIAN:  Margarita Rana, MD   REASON FOR PRESENTATION:  Evaluate the patient with atrial fibrillation  and diastolic heart failure.   HISTORY OF PRESENT ILLNESS:  The patient is now 45 years old.  Since I  last saw him, he says he was hospitalized for a week at [facility] with  apparent bronchitis and a flare of COPD.  He has continued to lose  weight and is down another 9 pounds, which is a total of 25.  He says he  is able to do a little more activity and a little walking.  He is still  restricted by his dyspnea, however.  He denies any PND, though he does  sleep propped up.  He has had no chest discomfort.  He rarely feels  palpitations and has had no presyncope or syncope.   PAST MEDICAL HISTORY:  1. Diastolic heart failure.  2. Nonobstructive coronary artery disease.  3. Pulmonary hypertension.  4. Sleep apnea.  5. Paroxysmal atrial fibrillation.  6. Morbid obesity.  7. Gout.  8. Smoke and tobacco use.   ALLERGIES:  None.   MEDICATIONS:  1. Glucophage 500 mg b.i.d.  2. Potassium 4 mEq daily.  3. Furosemide 80 mg q.a.m. and 40 mg q.p.m.  4. Aspirin 325 mg daily.  5. Cardizem 90 mg t.i.d.   REVIEW OF SYSTEMS:  As stated in the HPI and otherwise negative for  other systems.   PHYSICAL EXAMINATION:  GENERAL:  The patient is in no distress.  VITAL SIGNS:  Blood pressure 122/77, heart rate 85 and regular, and  weight 387 pounds.  NECK:  No jugular venous distention at 45 degrees, carotid upstroke  brisk and symmetric, no bruits, no thyromegaly.  LUNGS:  Clear to auscultation bilaterally.  HEART:  PMI not displaced or sustained; S1 and S2 within normal limits,  no S3, no S4; no clicks, no rubs, no murmurs.  ABDOMEN:   Obese; positive bowel sounds, normal in frequency and pitch;  no bruits, no rebound, no guarding, no midline pulsatile mass; no  organomegaly.  SKIN:  No rashes, no nodules.  EXTREMITIES:  Pulses 2+, trace bilateral lower extremity edema.  NEURO:  Grossly intact.   EKG, sinus rhythm, rate 84, axis within normal limits, intervals within  normal limits, no acute ST-T wave changes.   ASSESSMENT AND PLAN:  1. Dyspnea.  His dyspnea is at baseline or slightly improved.  This is      related to diastolic heart failure.  He is going to continue with      salt and fluid restriction and the medicines as listed.  He needs      to continue to lose weight.  2. Obesity.  This is the heart of many of his problems.  I have told      him I want him to  lose another 40 pounds down to 250.  3. Atrial fibrillation.  He has a [place] score of 0.  He does not need      any Coumadin.  He is not having any sustained paroxysms.  No      further therapy is planned.  4. Tobacco.  He understands the need to stop smoking.  5. Diabetes per Dr. Edrick Oh.  6. Followup.  I will see him back in 12 months or sooner if needed.     Minus Breeding, MD, [facility]  Electronically Signed    JH/MedQ  DD: 02/17/2008  DT: 02/18/2008  Job #: [id number]   cc:   Margarita Rana, M.D.

## 2010-05-23 NOTE — Procedures (Signed)
NAME:  [name], [name]              ACCOUNT NO.:  [id number]   MEDICAL RECORD NO.:  [id number]          PATIENT TYPE:  OUT   LOCATION:  SLEEP CENTER                 FACILITY:  [facility] [place]   PHYSICIAN:  Rigoberto Noel, MD      DATE OF BIRTH:  [birth year]   DATE OF STUDY:  01/20/2007                            NOCTURNAL POLYSOMNOGRAM   REFERRING PHYSICIAN:   INDICATION FOR STUDY:  [name] is a 45 year old Caucasian gentleman  with morbid obesity, weight of 385 pounds, BMI 57, neck size 21.5 inches  with excessive day time somnolence, Epworth Sleepiness Score 20/24, loud  snoring and un refreshing sleep, who had been started on CPAP therapy  after a recent hospitalization.  The study was performed as a baseline  polysomnogram.   This nocturnal polysomnogram was performed in the sleep technologist in  attendance .  EEG, EOG, EMG, EKG, and respiratory parameters were  recorded.  Sleep stages, arousals, limb movements, and respiratory data  were scored according to criteria laid out by the American Academy of  Sleep Medicine.   EPWORTH SLEEPINESS SCORE:  20/24.   MEDICATIONS:   SLEEP ARCHITECTURE:  Lights out was 10:30 p.m.  Lights on was at 4:34  a.m.  Total sleep time was 212 minutes with a sleep period of time of  324.5 minutes, leading to a sleep efficiency of 59%.  Sleep latency was  35 minutes.  Wake after sleep onset was 112.5 minutes.  REM latency was  299.5 minutes.  Sleep stages as a percentage of total sleep time was N1  34.9%, N2 53.3%, 0% N3, and 11.8% (25 minutes) of REM sleep.  There was  a single period of REM sleep around 4 a.m.   AROUSAL DATA:  There were a total of 314 arousals with an arousal index  of 88.9 events per hour.  At least 31 were spontaneous.  125 were  associated with desaturations.  152 were associated with hypopneas and 6  with apneas.   RESPIRATORY DISTURBANCE DATA:  There were a total of 5 obstructive  apneas, 2 central apneas, 4 mixed apneas, and  265 hypopneas, leading to  an apnea-hypopnea index of 78.1 events per hour.  Six RERAs were noted.  Of these, 28 hypopneas and 1 central apnea were noted during REM sleep,  leading to a REM-related AHI of 69.6 events per hour.  He spent some  time in the supine position, although this does not seem to be  accurately recorded.   LIMB MOVEMENT DATA:  There were no significant limb movements noted.   OXYGEN DATA:  There were a total of 479 desaturations greater than 4%,  leading to a desaturation index of 135.6 events per hour.  The lowest  desaturation recorded was 28%.  He spent 143 minutes with a desaturation  less than 88%.   CARDIAC DATA:  The minimum heart rate was 35 beats per minute during non-  REM sleep, 41 beats per minute during REM sleep.  The highest heart rate  was 133 beats per minute during REM sleep, and 141 beats per minute  during non-REM sleep.   MOVEMENT-PARASOMNIA:  No significant limb  movements   DISCUSSION:  Single period of REM sleep noted.  REM onset was delayed.  Severe desaturations and respiratory events immediately after sleep  onset.  Long periods of awake after sleep onset.  Some supine sleep was  observed based on video data.   IMPRESSIONS:  1. Severe obstructive sleep apnea and hypopneas causing desaturations      and sleep fragmentation.  2. No cardiac arrhythmias or limb movements were noted.   RECOMMENDATIONS:  1. The treatment for this degree of sleep apnea involves weight loss      and continuous positive airway pressure.  The options include      performing a CPAP titration study or using an auto CPAP to titrate      to an adequate level of positive pressure to obliterate sleep      disordered breathing .  2. The patient should be encouraged to avoid medications with sedative      side-effects and cautioned to avoid driving when sleepy.      Rigoberto Noel, MD  Electronically Signed     RVA/MEDQ  D:  01/22/2007 13:54:34  T:   01/22/2007 14:39:35  Job:  [id number]

## 2010-05-26 NOTE — H&P (Signed)
NAME:  [name], [name]              ACCOUNT NO.:  [id number]   MEDICAL RECORD NO.:  [id number]          PATIENT TYPE:  OBV   LOCATION:                               FACILITY:  [place]   PHYSICIAN:  Melissa L. Lovena Le, MD  DATE OF BIRTH:  [birth year]   DATE OF ADMISSION:  01/03/2004  DATE OF DISCHARGE:                                HISTORY & PHYSICAL   TIME OF ADMISSION:  Approximately 3:30 a.m.   CHIEF COMPLAINT:  Shortness of breath.   PRIMARY CARE PHYSICIAN:  Unassigned.   HISTORY OF PRESENT ILLNESS:  The patient is a 45 year old white male who  presented to the emergency room with increasing shortness of breath.  According to the patient, he was seen by the [facility] several days  ago and given a cough syrup called HydroCP, an antibiotic which according to  the ER record likely was Ketek, and an albuterol MDI.  The patient reports  positive fever and sweats, clear sputum production, but no nausea or  vomiting.  He states that he used his medication appropriately and was doing  a little bit better; however, today he noted worsening of his shortness of  breath, so he came to the emergency room.  The patient was given a nebulizer  treatment without relief, magnesium sulfate 2 g without relief, but with  some effect.  After he was given prednisone and when he continued to have  severe wheezing was [facility].  The  patient was placed on a continuous nebulizer x1 hour with improved wheezing  and shortness of breath.   REVIEW OF SYSTEMS:  As stated, positive fever, positive chills and sweats,  no dysuria, positive diarrhea, no chest pain.  The patient states he is  awaking with shortness of breath since becoming ill.  He denies any known  hematochezia.  All other review of systems are negative.   SOCIAL HISTORY:  He denies any tobacco use;  however, he does chew tobacco,  but he does not smoke.  He states he has about a beer a day.  He  has no  illicit drug use.  He is employed as a Runner, broadcasting/film/video person.   FAMILY HISTORY:  Mom is deceased.  Her medical illness are not well defined.  Dad is deceased with lung and throat cancer.   PAST SURGICAL HISTORY:  None.   PAST MEDICAL HISTORY:  He has hypertension but not currently taking  medications.  He probably has sleep apnea, but never been tested, and he  likely has carpal tunnel syndrome but has not been able to afford surgery.   ALLERGIES:  No known drug allergies.   MEDICATIONS:  He just finished a course of cough syrup, an antibiotic which  likely was Ketek, and an MDI.   PHYSICAL EXAMINATION:  VITAL SIGNS:  Temperature 97.1, blood pressure  144/66, pulse 88, respirations 24, saturations 94%.  GENERAL:  This is an obese white male in no acute distress.  After one hour  or continuous nebulizers, he is having improved wheezing;  however, he does  have difficulty completing sentences.  CHEST:  End-expiratory wheezing bilaterally. decreased air movement  bilaterally at the bases.  CARDIOVASCULAR:  Regular rate and rhythm, positive S1 and S2, no S3 or S4,  no murmurs, rubs, or gallops.  ABDOMEN:  Obese, soft, nontender, nondistended, with positive bowel sounds.  EXTREMITIES:  No clubbing, cyanosis, or edema.  NEUROLOGIC:  He is awake, alert, oriented x3.  Cranial nerves II-XII are  intact.  Power is 5/5.  Lower extremities show good power, but his grip is  4/5 bilaterally.   LABORATORY DATA:  Sodium 142, potassium 3.9, chloride 107, CO2 21, BUN 21,  creatinine 1.3, glucose 123.  His white count is 6.2, hemoglobin 15.8,  hematocrit 45.8, platelets 140,000.   ASSESSMENT AND PLAN:  This is a 45 year old white male with post-pneumonia  bronchospasm and asthmatic symptoms.  He was recently treated with  antibiotics, MDI, and cough syrup.  He is presenting now with severe  wheezing and shortness of breath.   Problem 1.  Cardiovascular.  We will check a cardiovascular  panel just to  rule out other causes for his pulmonary presentation.  He is by history  mildly hypertensive with no medications recently.  We would like to  establish an outpatient practitioner for him to help with his hypertension.   Problem 2.  Pulmonary.  With the recent treatment for pneumonia, still  remaining short of breath with wheezing, we will start him on nebulizer of  Humibid, a flutter valve, and antibiotics.  His chest x-ray really is only  showing bronchitic changes at this point, but he does have a partially-  treated pneumonia.   Problem 3.  Gastrointestinal.  We will start him on Protonix and, in  particular, while he is on steroids.   Problem 4.  Genitourinary.  Will check a urinalysis.   Problem 5.  Endocrine.  Will check a TSH and hemoglobin A1C.   Problem 6.  For DVT prophylaxis, he will have early ambulation, and will  observe him for 23 hours to see if he responds to steroids and restarting of  the antibiotics.  If he remains symptom free, he likely can be discharged to  home for follow-up care.  We will have a social work consult for obtaining  information about [facility].      Meli   MLT/MEDQ  D:  01/03/2004  T:  01/03/2004  Job:  [id number]

## 2010-05-26 NOTE — Procedures (Signed)
[facility]  Patient:    [name], [name]                     MRN: [id number] Proc. Date: 06/28/00 Adm. Date:  [id number] Attending:  Rafael Bihari CC:         Candace Gallus, M.D.   Procedure Report  PROCEDURE:  Colonoscopy with biopsy.  INDICATION FOR PROCEDURE:  Recent rectal bleeding, diarrhea and abdominal pain with CT scan of the abdomen showing diffuse colonic thickening. The procedure is to assess for presence of colitis and to rule out inflammatory bowel disease.  DESCRIPTION OF PROCEDURE:  The patient was placed in the left lateral decubitus position then placed on the pulse monitor with continuous low flow oxygen delivered by nasal cannula. He was sedated with 60 mg IV Demerol 8 mg IV Versed. The Olympus video colonoscope was inserted into the rectum and advanced to the cecum, confirmed by transillumination at McBurneys point and visualization of the ileocecal valve and appendiceal orifice. The prep was excellent. The cecum, ascending, transverse, descending and sigmoid colon all appeared normal with the exception of a few very widely scattered areas of punctate erythema. Representative biopsies were taken of the ascending and transverse colon and sent in separate bottles. In the rectum, there was pronounced patchy proctitis with erythema, granularity and a slight amount of overlying exudate limited to the distal 8 cm and not confluent over that length. A third set of biopsies were taken from the inflamed areas of the rectum and sent in the third specimen container. The scope was then withdrawn and the patient returned to the recovery room in stable condition. The patient tolerated the procedure well and there were no immediate complications.  IMPRESSION:  Mild to moderate proctitis with some evidence of mild residual inflammation of some remaining areas of the colon but appearance is essentially normal above the rectum.  PLAN:   Await all biopsy results. Will continue Asacol for now. DD:  06/28/00 TD:  06/28/00 Job: [id number]

## 2010-05-26 NOTE — H&P (Signed)
NAME:  Belt, [name]              ACCOUNT NO.:  [id number]   MEDICAL RECORD NO.:  [id number]          PATIENT TYPE:  INP   LOCATION:  5702                         FACILITY:  [place]   PHYSICIAN:  Leonides Sake. Lucia Gaskins, M.D.DATE OF BIRTH:  [birth year]   DATE OF ADMISSION:  03/18/2005  DATE OF DISCHARGE:  03/19/2005                                HISTORY & PHYSICAL   REASON FOR ADMISSION:  Admit patient with pharyngeal abscess.   BRIEF HISTORY:  [name] is a 45 year old gentleman who has had a sore  throat for a couple of days now, last night because acutely worse with  difficulty swallowing and some difficulty breathing. Because of this, he  presented to the emergency room last night. He was given Vicodin last night  and actually is feeling much better this morning. He still has a very tender  anterior neck to palpation. He underwent a CT scan of the neck which showed  subcutaneous air around the laryngeal cartilage anteriorly, questionable  small retropharyngeal fluid. Findings consistent with subcu emphysema,  submucosal air.   EXAMINATION:  VITAL SIGNS:  The patient is afebrile, temperature 98.4.  GENERAL:  He is awake, alert, having no airway difficulty, no trouble  breathing, no confusion.  HEENT:  Oral exam revealed mild erythema, no gross pus, no obvious  retropharyngeal swelling. Fiberoptic laryngoscopy was performed and on  fiberoptic laryngoscopy he has some diffuse swelling, more on the right  hypopharyngeal area with poor opening of the pyriform region on the right  side. No obvious retropharyngeal bulging noted. The glottis was clear with  no significant laryngeal swelling.  NECK:  Exquisitely tender to palpation but no obvious crepitus on palpation.  LUNGS:  Clear.  CARDIAC:  Regular rate and rhythm without murmur.  ABDOMEN:  Soft and nontender.   IMPRESSION:  Acute pharyngitis with submucosal free air and questionable  abscess.   RECOMMENDATION:  Will admit  the patient for IV antibiotics, blood cultures  and throat culture. Presently do not identify a definite site to open or  drain. Will plan on observation for the next 24 hours in the hospital on O2  saturation monitor.   PAST MEDICAL HISTORY:  Significant for gout for which he takes colchicine  and indomethacin and also questionable sleep apnea. No cardiac problems, no  diabetes, plus/minus hypertension. He has no known drug allergies.           ______________________________  Leonides Sake Lucia Gaskins, M.D.     CEN/MEDQ  D:  03/18/2005  T:  03/19/2005  Job:  [id number]

## 2010-06-16 ENCOUNTER — Other Ambulatory Visit: Payer: Self-pay | Admitting: Family Medicine

## 2010-06-16 ENCOUNTER — Ambulatory Visit (HOSPITAL_COMMUNITY)
Admission: RE | Admit: 2010-06-16 | Discharge: 2010-06-16 | Disposition: A | Discharge status: Home / Self Care | Payer: Medicare Other | Source: Ambulatory Visit | Attending: Family Medicine | Admitting: Family Medicine

## 2010-06-16 DIAGNOSIS — M79609 Pain in unspecified limb: Secondary | ICD-10-CM | POA: Insufficient documentation

## 2010-06-16 DIAGNOSIS — Z0389 Encounter for observation for other suspected diseases and conditions ruled out: Secondary | ICD-10-CM | POA: Insufficient documentation

## 2010-06-16 DIAGNOSIS — M79605 Pain in left leg: Secondary | ICD-10-CM

## 2010-06-16 DIAGNOSIS — R079 Chest pain, unspecified: Secondary | ICD-10-CM | POA: Insufficient documentation

## 2010-06-16 DIAGNOSIS — M479 Spondylosis, unspecified: Secondary | ICD-10-CM | POA: Insufficient documentation

## 2010-06-16 MED ORDER — IOHEXOL 350 MG/ML SOLN
125.0000 mL | Freq: Once | INTRAVENOUS | Status: AC | PRN
  Administered 2010-06-16: 125 mL via INTRAVENOUS

## 2010-09-07 ENCOUNTER — Other Ambulatory Visit: Payer: Self-pay | Admitting: Pulmonary Disease

## 2010-09-07 NOTE — Telephone Encounter (Signed)
Received refill request from [facility] for ventolin. I sent this back denied, as it is not on med list and he is overdue for rov. Has a fib also so do not know if should take albuterol

## 2010-09-27 LAB — COMPREHENSIVE METABOLIC PANEL
Albumin: 3.5
Alkaline Phosphatase: 52
BUN: 33 — ABNORMAL HIGH
Calcium: 8.7
Creatinine, Ser: 1.27
Glucose, Bld: 268 — ABNORMAL HIGH
Total Protein: 7

## 2010-09-27 LAB — PROTIME-INR: Prothrombin Time: 13.1

## 2010-09-27 LAB — BLOOD GAS, ARTERIAL
Bicarbonate: 28.1 — ABNORMAL HIGH
FIO2: 0.21
Patient temperature: 98.6
pH, Arterial: 7.418
pO2, Arterial: 59 — ABNORMAL LOW

## 2010-09-27 LAB — CBC
HCT: 42.8
HCT: 44.2
HCT: 44.7
MCHC: 35.3
MCV: 85.2
Platelets: 114 — ABNORMAL LOW
Platelets: 114 — ABNORMAL LOW
Platelets: 114 — ABNORMAL LOW
RBC: 5.03
RBC: 5.07
RDW: 13.6
WBC: 5.9
WBC: 6.5
WBC: 6.7

## 2010-09-27 LAB — POCT I-STAT 3, VENOUS BLOOD GAS (G3P V)
Acid-Base Excess: 2
Operator id: 233621
Operator id: 233621
pH, Ven: 7.416 — ABNORMAL HIGH

## 2010-09-27 LAB — BASIC METABOLIC PANEL
BUN: 26 — ABNORMAL HIGH
BUN: 30 — ABNORMAL HIGH
Calcium: 8.6
Chloride: 93 — ABNORMAL LOW
Creatinine, Ser: 1.26
GFR calc Af Amer: >60
GFR calc Af Amer: >60
GFR calc non Af Amer: >60
GFR calc non Af Amer: >60
GFR calc non Af Amer: >60
Potassium: 3.8
Potassium: 4.2
Sodium: 138

## 2010-09-27 LAB — APTT: aPTT: 24

## 2010-09-27 LAB — DIFFERENTIAL
Basophils Relative: 0
Lymphocytes Relative: 9 — ABNORMAL LOW
Lymphs Abs: 0.6 — ABNORMAL LOW
Monocytes Relative: 3
Neutro Abs: 5.8
Neutrophils Relative %: 87 — ABNORMAL HIGH

## 2010-09-27 LAB — POCT I-STAT 3, ART BLOOD GAS (G3+)
Operator id: 233621
pCO2 arterial: 42.4
pH, Arterial: 7.418

## 2010-09-27 LAB — CULTURE, BLOOD (ROUTINE X 2): Culture: NO GROWTH

## 2010-09-27 LAB — HEMOGLOBIN A1C: Mean Plasma Glucose: 183

## 2010-09-27 LAB — B-NATRIURETIC PEPTIDE (CONVERTED LAB): Pro B Natriuretic peptide (BNP): <32

## 2010-10-03 LAB — POCT I-STAT, CHEM 8
Chloride: 103
HCT: 47
Hemoglobin: 16
Potassium: 3.6

## 2010-10-03 LAB — DIFFERENTIAL
Eosinophils Relative: 3
Lymphocytes Relative: 20
Lymphs Abs: 1.4
Monocytes Absolute: 0.5
Monocytes Relative: 7

## 2010-10-03 LAB — CBC
HCT: 46.4
Hemoglobin: 15.8
RBC: 5.2
RDW: 13.1

## 2010-10-03 LAB — POCT CARDIAC MARKERS
CKMB, poc: 2.5
Operator id: 284251
Troponin i, poc: <0.05

## 2010-10-03 LAB — COMPREHENSIVE METABOLIC PANEL
Alkaline Phosphatase: 64
BUN: 13
CO2: 20
Chloride: 101
Creatinine, Ser: 0.94
GFR calc non Af Amer: >60
Glucose, Bld: 117 — ABNORMAL HIGH
Total Bilirubin: 0.6

## 2010-11-23 ENCOUNTER — Encounter (INDEPENDENT_AMBULATORY_CARE_PROVIDER_SITE_OTHER): Payer: Self-pay | Admitting: Internal Medicine

## 2010-12-07 ENCOUNTER — Encounter (INDEPENDENT_AMBULATORY_CARE_PROVIDER_SITE_OTHER): Payer: Self-pay | Admitting: Internal Medicine

## 2010-12-07 ENCOUNTER — Ambulatory Visit (INDEPENDENT_AMBULATORY_CARE_PROVIDER_SITE_OTHER): Payer: Medicare Other | Admitting: Internal Medicine

## 2010-12-07 DIAGNOSIS — R748 Abnormal levels of other serum enzymes: Secondary | ICD-10-CM

## 2010-12-07 DIAGNOSIS — R160 Hepatomegaly, not elsewhere classified: Secondary | ICD-10-CM

## 2010-12-07 DIAGNOSIS — Z7289 Other problems related to lifestyle: Secondary | ICD-10-CM

## 2010-12-07 DIAGNOSIS — R7401 Elevation of levels of liver transaminase levels: Secondary | ICD-10-CM

## 2010-12-07 DIAGNOSIS — E785 Hyperlipidemia, unspecified: Secondary | ICD-10-CM

## 2010-12-07 DIAGNOSIS — R161 Splenomegaly, not elsewhere classified: Secondary | ICD-10-CM

## 2010-12-07 DIAGNOSIS — K7689 Other specified diseases of liver: Secondary | ICD-10-CM

## 2010-12-07 DIAGNOSIS — D696 Thrombocytopenia, unspecified: Secondary | ICD-10-CM

## 2010-12-07 DIAGNOSIS — D72819 Decreased white blood cell count, unspecified: Secondary | ICD-10-CM

## 2010-12-07 DIAGNOSIS — Z789 Other specified health status: Secondary | ICD-10-CM

## 2010-12-07 DIAGNOSIS — K7460 Unspecified cirrhosis of liver: Secondary | ICD-10-CM

## 2010-12-07 DIAGNOSIS — R162 Hepatomegaly with splenomegaly, not elsewhere classified: Secondary | ICD-10-CM

## 2010-12-07 NOTE — Patient Instructions (Signed)
Labs and Office visit in 3 months.

## 2010-12-07 NOTE — Progress Notes (Signed)
Subjective:     Patient ID: [name], male   DOB: 02/04/1965, 45 y.o.   MRN: [id number]  HPIMr. Gray is a 45 yr old male referred to our office by [name] for hepatomegaly and elevated liver enzymes.   He sees Dr. Sonny Gray for low WBC ct and these records are not available. I will request them.  He tells me he has spleenomegaly and his liver is inflamed.  He was taking the Colchicine twice a day. He tells me he has lost 20 pounds in the past month intentional.  Appetite is good. No unintentional weight loss.  No abdominal pai. He has a BM every day or twice a day. No melena or bright red rectal bleeding. 11/10/2010 ALP 70 AST 72, ALT 46 01/26/2009 H and H 13.6 and 38.6, Platelet ct low at 74.  11/14/2010 US abdomen: Liver : Hepatomegaly noted with liver measuring 31cm in length. Spleen: Measure 19cm in length. Normal echogenicity. Normal CBD 5.70m  Family history of colon cancer (mother ).  He last colonoscopy was in 2010 by [name]. Review of Systems Current Outpatient Prescriptions  Medication Sig Dispense Refill  . ALPRAZolam (XANAX) 0.5 MG tablet Take 0.5 mg by mouth 2 (two) times daily as needed.        .Marland Kitchenaspirin 325 MG buffered tablet Take 325 mg by mouth daily.        . colchicine 0.6 MG tablet Take 0.6 mg by mouth 2 (two) times daily.        .Marland Kitchendiltiazem (CARDIZEM) 90 MG tablet Take 90 mg by mouth 3 (three) times daily.        . ergocalciferol (VITAMIN D2) 50000 UNITS capsule Take 50,000 Units by mouth.        . ezetimibe (ZETIA) 10 MG tablet Take 10 mg by mouth daily.        . Fluticasone-Salmeterol (ADVAIR) 250-50 MCG/DOSE AEPB Inhale 1 puff into the lungs daily.        .Marland KitchenglipiZIDE (GLUCOTROL) 10 MG tablet Take 10 mg by mouth 1 dose over 46 hours.        .Marland KitchenHYDROcodone-acetaminophen (VICODIN) 5-500 MG per tablet Take 1 tablet by mouth 2 (two) times daily as needed.        .Marland Kitchenipratropium (ATROVENT) 0.02 % nebulizer solution Take 500 mcg by nebulization 4 (four) times  daily.        . metFORMIN (GLUCOPHAGE) 500 MG tablet Take 500 mg by mouth as directed.        .Marland Kitchenomeprazole (PRILOSEC) 20 MG capsule Take 20 mg by mouth daily.        . potassium chloride SA (K-DUR,KLOR-CON) 20 MEQ tablet Take 20 mEq by mouth daily.        . pregabalin (LYRICA) 150 MG capsule Take 150 mg by mouth 2 (two) times daily.        . sitaGLIPtan (JANUVIA) 100 MG tablet Take 100 mg by mouth daily.        .Marland Kitchentestosterone (ANDROGEL) 50 MG/5GM GEL Place 5 g onto the skin daily.        .Marland Kitchentiotropium (SPIRIVA) 18 MCG inhalation capsule Place 18 mcg into inhaler and inhale daily.        .Marland Kitchentopiramate (TOPAMAX) 100 MG tablet Take 100 mg by mouth daily.        .Marland Kitchenzolpidem (AMBIEN) 10 MG tablet Take 10 mg by mouth at bedtime as needed.         Past  Medical History  Diagnosis Date  . Gout   . Diabetes mellitus     type 2  . Depression   . RA (rheumatoid arthritis)   . Cardiac arrhythmia due to congenital heart disease   . COPD (chronic obstructive pulmonary disease)     with cpap   Past Surgical History  Procedure Date  . Sciatic nerve exploration    Family Status  Relation Status Death Age  . Mother Deceased     colon cancer-intestinal  . Father Deceased     lung and throat cancer  . Sister Deceased   . Brother Deceased     Brain cancer.   History   Social History  . Marital Status: Divorced    Spouse Name: N/A    Number of Children: N/A  . Years of Education: N/A   Occupational History  . Not on file.   Social History Main Topics  . Smoking status: Never Smoker   . Smokeless tobacco: Not on file   Comment: Chews tobacco  . Alcohol Use: Not on file     Drinks two 16oz beer a day at the most. He use to drink 7-8 of the 40oz beer a day about a month ago x for years  . Drug Use: No     Use to smoke cocaine and marijuana. No IV drug use  . Sexually Active: Not on file   Other Topics Concern  . Not on file   Social History Narrative  . No narrative on file    History   Social History Narrative  . No narrative on file   Allergies  Allergen Reactions  . Prozac (Fluoxetine Hcl)         Objective:   Physical Exam Filed Vitals:   12/07/10 1522  Height: 6' (1.829 m)  Weight: 401 lb 3.2 oz (181.983 kg)    Alert and oriented. Skin warm and dry. Oral mucosa is moist. Natural teeth in good condition. Sclera anicteric, conjunctivae is pink. Thyroid not enlarged. No cervical lymphadenopathy. Lungs clear. Heart regular rate and rhythm.  Abdomen is soft. Bowel sounds are positive. No    I am unable to palpate his liver or spleen due to his extreme obesity. No abdominal masses felt. No tenderness.2-3 + edmea to lower extremities. Patient is alert and oriented.     Assessment:    Heptospelnomegaly. Elevated liver enzymes. Auto immune process needs to be ruled out as well as Hepatitis B and C.  He has drank heavily in the past and this could be Alcoholic liver disease.     Plan:    CBC, Cmet, AFP, PT/INR,  SMA, ANA, Alpha 1 antitripsin, Sed rate, ferritin, iron, ceruplasma, Hep B surface antibiody, Hep C antibody.  Further recommendations once we have the lab work back. He may need an US guided liver biopsy. Stop the Meloxicam.    F/u 3 months.   He will also need an US guided liver biopsy but this will be placed on hold.   Stop the Meloxicam and the Colchicine

## 2010-12-08 ENCOUNTER — Telehealth (INDEPENDENT_AMBULATORY_CARE_PROVIDER_SITE_OTHER): Payer: Self-pay | Admitting: *Deleted

## 2010-12-08 LAB — SEDIMENTATION RATE: Sed Rate: 34 mm/hr — ABNORMAL HIGH (ref 0–16)

## 2010-12-08 LAB — HEPATITIS B SURFACE ANTIGEN: Hepatitis B Surface Ag: NEGATIVE

## 2010-12-08 LAB — CBC WITH DIFFERENTIAL/PLATELET
Basophils Absolute: 0 10*3/uL (ref 0.0–0.1)
Eosinophils Relative: 5 % (ref 0–5)
Lymphocytes Relative: 23 % (ref 12–46)
Lymphs Abs: 1 10*3/uL (ref 0.7–4.0)
MCV: 81.2 fL (ref 78.0–100.0)
Neutro Abs: 2.7 10*3/uL (ref 1.7–7.7)
Neutrophils Relative %: 63 % (ref 43–77)
Platelets: 64 10*3/uL — ABNORMAL LOW (ref 150–400)
RBC: 4.37 MIL/uL (ref 4.22–5.81)
RDW: 16.6 % — ABNORMAL HIGH (ref 11.5–15.5)
WBC: 4.2 10*3/uL (ref 4.0–10.5)

## 2010-12-08 LAB — PROTIME-INR
INR: 1.2 (ref <1.50)
Prothrombin Time: 15.7 seconds — ABNORMAL HIGH (ref 11.6–15.2)

## 2010-12-08 LAB — ANA: Anti Nuclear Antibody(ANA): NEGATIVE

## 2010-12-08 LAB — AFP TUMOR MARKER: AFP-Tumor Marker: 4.9 ng/mL (ref 0.0–8.0)

## 2010-12-08 NOTE — Telephone Encounter (Signed)
Patient called and said everything is hard to remember. He forgot to mention that he has diabetes and unbiblical hernia.

## 2010-12-13 ENCOUNTER — Telehealth (INDEPENDENT_AMBULATORY_CARE_PROVIDER_SITE_OTHER): Payer: Self-pay | Admitting: *Deleted

## 2010-12-13 NOTE — Telephone Encounter (Signed)
Patient would like to know if [name] has received the rest of the blood test results. The return phone number is [phone].

## 2010-12-13 NOTE — Telephone Encounter (Signed)
Not all of labs are back. I have spoke with the patient.

## 2010-12-19 ENCOUNTER — Other Ambulatory Visit (INDEPENDENT_AMBULATORY_CARE_PROVIDER_SITE_OTHER): Payer: Self-pay | Admitting: Internal Medicine

## 2010-12-20 LAB — ANTI-SMOOTH MUSCLE ANTIBODY, IGG: Smooth Muscle Ab: 9 U (ref <20)

## 2010-12-20 LAB — CERULOPLASMIN: Ceruloplasmin: 33 mg/dL (ref 20–60)

## 2010-12-21 ENCOUNTER — Other Ambulatory Visit (INDEPENDENT_AMBULATORY_CARE_PROVIDER_SITE_OTHER): Payer: Self-pay | Admitting: Internal Medicine

## 2010-12-21 DIAGNOSIS — K7460 Unspecified cirrhosis of liver: Secondary | ICD-10-CM

## 2010-12-21 DIAGNOSIS — R748 Abnormal levels of other serum enzymes: Secondary | ICD-10-CM

## 2010-12-21 NOTE — Progress Notes (Signed)
Results given to patient.    [name], Needs US guided liver biopsy.  I have spoken with patient.

## 2010-12-21 NOTE — Progress Notes (Signed)
Results have been given to patient

## 2011-01-01 ENCOUNTER — Other Ambulatory Visit: Payer: Self-pay | Admitting: Radiology

## 2011-01-04 ENCOUNTER — Encounter (HOSPITAL_COMMUNITY): Payer: Self-pay | Admitting: Pharmacy Technician

## 2011-01-05 ENCOUNTER — Ambulatory Visit (HOSPITAL_COMMUNITY)
Admission: RE | Admit: 2011-01-05 | Discharge: 2011-01-05 | Disposition: A | Discharge status: Home / Self Care | Payer: Medicare Other | Source: Ambulatory Visit | Attending: Internal Medicine | Admitting: Internal Medicine

## 2011-01-05 ENCOUNTER — Encounter (HOSPITAL_COMMUNITY): Payer: Self-pay

## 2011-01-05 DIAGNOSIS — E119 Type 2 diabetes mellitus without complications: Secondary | ICD-10-CM | POA: Insufficient documentation

## 2011-01-05 DIAGNOSIS — M069 Rheumatoid arthritis, unspecified: Secondary | ICD-10-CM | POA: Insufficient documentation

## 2011-01-05 DIAGNOSIS — R748 Abnormal levels of other serum enzymes: Secondary | ICD-10-CM

## 2011-01-05 DIAGNOSIS — F329 Major depressive disorder, single episode, unspecified: Secondary | ICD-10-CM | POA: Insufficient documentation

## 2011-01-05 DIAGNOSIS — J449 Chronic obstructive pulmonary disease, unspecified: Secondary | ICD-10-CM | POA: Insufficient documentation

## 2011-01-05 DIAGNOSIS — F3289 Other specified depressive episodes: Secondary | ICD-10-CM | POA: Insufficient documentation

## 2011-01-05 DIAGNOSIS — K7460 Unspecified cirrhosis of liver: Secondary | ICD-10-CM | POA: Insufficient documentation

## 2011-01-05 DIAGNOSIS — J4489 Other specified chronic obstructive pulmonary disease: Secondary | ICD-10-CM | POA: Insufficient documentation

## 2011-01-05 DIAGNOSIS — M109 Gout, unspecified: Secondary | ICD-10-CM | POA: Insufficient documentation

## 2011-01-05 HISTORY — DX: Unspecified atrial fibrillation: I48.91

## 2011-01-05 LAB — CBC
Hemoglobin: 11.1 g/dL — ABNORMAL LOW (ref 13.0–17.0)
MCH: 25.9 pg — ABNORMAL LOW (ref 26.0–34.0)
MCHC: 32.2 g/dL (ref 30.0–36.0)
MCV: 80.4 fL (ref 78.0–100.0)
Platelets: 65 10*3/uL — ABNORMAL LOW (ref 150–400)
RBC: 4.29 MIL/uL (ref 4.22–5.81)

## 2011-01-05 LAB — PROTIME-INR: Prothrombin Time: 16.2 seconds — ABNORMAL HIGH (ref 11.6–15.2)

## 2011-01-05 LAB — GLUCOSE, CAPILLARY: Glucose-Capillary: 98 mg/dL (ref 70–99)

## 2011-01-05 MED ORDER — MIDAZOLAM HCL 2 MG/2ML IJ SOLN
INTRAMUSCULAR | Status: AC
  Filled 2011-01-05: qty 4

## 2011-01-05 MED ORDER — FENTANYL CITRATE 0.05 MG/ML IJ SOLN
INTRAMUSCULAR | Status: AC
  Filled 2011-01-05: qty 4

## 2011-01-05 MED ORDER — HYDROMORPHONE HCL PF 1 MG/ML IJ SOLN
INTRAMUSCULAR | Status: AC
  Filled 2011-01-05: qty 1

## 2011-01-05 MED ORDER — HYDROMORPHONE HCL PF 1 MG/ML IJ SOLN
1.0000 mg | Freq: Once | INTRAMUSCULAR | Status: AC
  Administered 2011-01-05: 1 mg via INTRAVENOUS
  Filled 2011-01-05: qty 1

## 2011-01-05 MED ORDER — SODIUM CHLORIDE 0.9 % IV SOLN
INTRAVENOUS | Status: DC
  Administered 2011-01-05: 10:00:00 via INTRAVENOUS

## 2011-01-05 MED ORDER — HYDROCODONE-ACETAMINOPHEN 10-325 MG PO TABS
1.0000 | ORAL_TABLET | ORAL | Status: DC | PRN
  Filled 2011-01-05: qty 1

## 2011-01-05 MED ORDER — FENTANYL CITRATE 0.05 MG/ML IJ SOLN
INTRAMUSCULAR | Status: AC | PRN
  Administered 2011-01-05 (×4): 50 ug via INTRAVENOUS

## 2011-01-05 MED ORDER — MIDAZOLAM HCL 5 MG/5ML IJ SOLN
INTRAMUSCULAR | Status: AC | PRN
  Administered 2011-01-05 (×3): 1 mg via INTRAVENOUS

## 2011-01-05 NOTE — ED Notes (Signed)
Patient has rash to upper chest and just below nipple line. States the rash has been intermittent since  He was 45 yrs old.

## 2011-01-05 NOTE — Procedures (Signed)
Procedure : random liver core needle biopsy Specimen : 18 gauge cores x 3 Complications : none immediate  Sedation : 3 mg versed , 200 mcg fentanyl- total sedation time = 25 minutes

## 2011-01-05 NOTE — H&P (Signed)
[name] is an 45 y.o. male.   Chief Complaint: abnormal liver functions; probable cirrhosis HPI: scheduled now for Liver Core Biopsy  Past Medical History  Diagnosis Date  . Gout   . Diabetes mellitus     type 2  . RA (rheumatoid arthritis)   . Cardiac arrhythmia due to congenital heart disease   . COPD (chronic obstructive pulmonary disease)     with cpap  . Depression     Past Surgical History  Procedure Date  . Sciatic nerve exploration   . Hernia repair   . Tonsillectomy     History reviewed. No pertinent family history. Social History:  reports that he has never smoked. He uses smokeless tobacco. He reports that he does not use illicit drugs. His alcohol history not on file.  Allergies:  Allergies  Allergen Reactions  . Prozac (Fluoxetine Hcl) Itching    Medications Prior to Admission  Medication Sig Dispense Refill  . albuterol (PROVENTIL HFA;VENTOLIN HFA) 108 (90 BASE) MCG/ACT inhaler Inhale 2 puffs into the lungs every 6 (six) hours as needed. For shortness of breath        . ALPRAZolam (XANAX) 0.5 MG tablet Take 0.5 mg by mouth at bedtime.       Marland Kitchen aspirin 325 MG buffered tablet Take 325 mg by mouth daily.        Marland Kitchen diltiazem (CARDIZEM) 90 MG tablet Take 90 mg by mouth 3 (three) times daily.        . ergocalciferol (VITAMIN D2) 50000 UNITS capsule Take 50,000 Units by mouth 2 (two) times a week.       . ezetimibe (ZETIA) 10 MG tablet Take 10 mg by mouth daily.        . Fluticasone-Salmeterol (ADVAIR) 250-50 MCG/DOSE AEPB Inhale 1 puff into the lungs daily.        Marland Kitchen HYDROcodone-acetaminophen (LORTAB) 7.5-500 MG per tablet Take 1 tablet by mouth every 6 (six) hours as needed. For pain       . ipratropium (ATROVENT) 0.02 % nebulizer solution Take 500 mcg by nebulization 4 (four) times daily as needed. For wheezing      . metFORMIN (GLUCOPHAGE) 500 MG tablet Take 500 mg by mouth as directed. Takes 2 in the am and 1 at night      . Multiple Vitamin  (MULITIVITAMIN WITH MINERALS) TABS Take 1 tablet by mouth daily.        Marland Kitchen omeprazole (PRILOSEC) 20 MG capsule Take 20 mg by mouth 2 (two) times daily.       . potassium chloride SA (K-DUR,KLOR-CON) 20 MEQ tablet Take 20 mEq by mouth daily.       . pregabalin (LYRICA) 150 MG capsule Take 150 mg by mouth 2 (two) times daily.        Marland Kitchen testosterone (ANDROGEL) 50 MG/5GM GEL Place 5 g onto the skin daily.        Marland Kitchen tiotropium (SPIRIVA) 18 MCG inhalation capsule Place 18 mcg into inhaler and inhale daily.        Marland Kitchen topiramate (TOPAMAX) 100 MG tablet Take 100 mg by mouth daily.        Marland Kitchen zolpidem (AMBIEN) 10 MG tablet Take 10 mg by mouth at bedtime as needed. For sleep       . Aspirin-Salicylamide-Caffeine (BC HEADACHE POWDER PO) Take 1 packet by mouth daily as needed. For pain        No current facility-administered medications on file as of 01/05/2011.  Results for orders placed during the hospital encounter of 01/05/11 (from the past 48 hour(s))  GLUCOSE, CAPILLARY     Status: Normal   Collection Time   01/05/11  8:47 AM      Component Value Range Comment   Glucose-Capillary 98  70 - 99 (mg/dL)    Comment 1 Documented in Chart      Comment 2 Notify RN      No results found.  Review of Systems  Constitutional: Negative for fever.  Respiratory: Negative for cough.   Gastrointestinal: Negative for nausea, vomiting, abdominal pain and diarrhea.  Neurological: Negative for headaches.    Blood pressure 175/104, pulse 88, temperature 98.7 F (37.1 C), temperature source Oral, resp. rate 18, height 6' (1.829 m), weight 385 lb (174.635 kg), SpO2 94.00%. Physical Exam  Constitutional: He is oriented to person, place, and time. He appears well-developed and well-nourished.  HENT:  Head: Normocephalic.  Eyes: EOM are normal.  Neck: Normal range of motion. Neck supple.  Cardiovascular: Normal rate, regular rhythm and normal heart sounds.   No murmur heard. Respiratory: Effort normal and breath  sounds normal. No respiratory distress. He has no wheezes.  GI: Soft. Bowel sounds are normal.  Musculoskeletal: Normal range of motion. He exhibits tenderness.       Ankles and knees painful from gout/arthritis  Neurological: He is alert and oriented to person, place, and time.  Skin: Skin is warm and dry.     Assessment/Plan Probable cirrhosis. Scheduled for liver core biopsy Pt aware of procedure benefits and risks and agreeable to proceed. Consent signed.  [name] A 01/05/2011, 9:02 AM

## 2011-01-17 ENCOUNTER — Ambulatory Visit (INDEPENDENT_AMBULATORY_CARE_PROVIDER_SITE_OTHER): Payer: Medicare Other | Admitting: Cardiology

## 2011-01-17 ENCOUNTER — Telehealth (INDEPENDENT_AMBULATORY_CARE_PROVIDER_SITE_OTHER): Payer: Self-pay | Admitting: *Deleted

## 2011-01-17 ENCOUNTER — Encounter: Payer: Self-pay | Admitting: Cardiology

## 2011-01-17 DIAGNOSIS — I4891 Unspecified atrial fibrillation: Secondary | ICD-10-CM

## 2011-01-17 DIAGNOSIS — I5032 Chronic diastolic (congestive) heart failure: Secondary | ICD-10-CM

## 2011-01-17 DIAGNOSIS — I509 Heart failure, unspecified: Secondary | ICD-10-CM

## 2011-01-17 NOTE — Assessment & Plan Note (Signed)
At this point he is having no symptomatic tachycardia palpitations. No further change in therapy is indicated. He will continue with meds as listed and let me know if he has increasing symptoms to

## 2011-01-17 NOTE — Assessment & Plan Note (Signed)
I am proud of his weight loss and I encouraged more of the same.

## 2011-01-17 NOTE — Progress Notes (Signed)
HPI The patient returns for follow up of palpitations.  He has lost 42 lbs since I last saw him.  He stopped EtOH.  He  feels better with his weight loss. He is being evaluated for elevated liver enzymes and had a biopsy of his liver. He is awaiting results.  He denies any ongoing palpitations above his usual. If he starts feeling some he'll take some extra Cardizem and possibly an anti-anxiety drug. He's not had any presyncope or syncope. He has had no new shortness of breath, PND or orthopnea. He is limited by chronic lower extremity pain in joint pains. His lower extremity edema is less than previous.  Allergies  Allergen Reactions  . Prozac (Fluoxetine Hcl) Itching    Current Outpatient Prescriptions  Medication Sig Dispense Refill  . albuterol (PROVENTIL HFA;VENTOLIN HFA) 108 (90 BASE) MCG/ACT inhaler Inhale 2 puffs into the lungs every 6 (six) hours as needed. For shortness of breath        . ALPRAZolam (XANAX) 0.5 MG tablet Take 0.5 mg by mouth at bedtime. Take two at qhs      . aspirin 325 MG buffered tablet Take 325 mg by mouth daily.        . Aspirin-Salicylamide-Caffeine (BC HEADACHE POWDER PO) Take 1 packet by mouth daily as needed. For pain       . diltiazem (CARDIZEM) 90 MG tablet Take 90 mg by mouth 3 (three) times daily.       . ergocalciferol (VITAMIN D2) 50000 UNITS capsule Take 50,000 Units by mouth 2 (two) times a week.       . ezetimibe (ZETIA) 10 MG tablet Take 10 mg by mouth daily.        . Fluticasone-Salmeterol (ADVAIR) 250-50 MCG/DOSE AEPB Inhale 1 puff into the lungs daily.        . furosemide (LASIX) 20 MG tablet Pt not sure how much lasix he takes      . HYDROcodone-acetaminophen (LORTAB) 7.5-500 MG per tablet Take 1 tablet by mouth every 6 (six) hours as needed. For pain       . ipratropium (ATROVENT) 0.02 % nebulizer solution Take 500 mcg by nebulization 4 (four) times daily as needed. For wheezing      . metFORMIN (GLUCOPHAGE) 500 MG tablet Take 500 mg by  mouth as directed. Takes 2 in the am and 1 at night      . Multiple Vitamin (MULITIVITAMIN WITH MINERALS) TABS Take 1 tablet by mouth daily.        Marland Kitchen omeprazole (PRILOSEC) 20 MG capsule Take 20 mg by mouth 2 (two) times daily.       . potassium chloride SA (K-DUR,KLOR-CON) 20 MEQ tablet Take 20 mEq by mouth daily.       . pregabalin (LYRICA) 150 MG capsule Take 150 mg by mouth 2 (two) times daily.        Marland Kitchen pyridOXINE (VITAMIN B-6) 100 MG tablet 100 mg. Pt taking vitamin b not sure what kind      . testosterone (ANDROGEL) 50 MG/5GM GEL Place 5 g onto the skin daily.        Marland Kitchen tiotropium (SPIRIVA) 18 MCG inhalation capsule Place 18 mcg into inhaler and inhale daily.        Marland Kitchen topiramate (TOPAMAX) 100 MG tablet Take 100 mg by mouth daily.        Marland Kitchen zolpidem (AMBIEN) 10 MG tablet Take 10 mg by mouth at bedtime as needed. For sleep  Past Medical History  Diagnosis Date  . Gout   . Diabetes mellitus     type 2  . RA (rheumatoid arthritis)   . Cardiac arrhythmia due to congenital heart disease   . COPD (chronic obstructive pulmonary disease)     with cpap  . Depression   . Atrial fibrillation     Past Surgical History  Procedure Date  . Sciatic nerve exploration   . Hernia repair   . Tonsillectomy     ROS:  As stated in the HPI and negative for all other systems.  PHYSICAL EXAM BP 102/53  Pulse 79  Ht 6' (1.829 m)  Wt 378 lb (171.46 kg)  BMI 51.27 kg/m2 GENERAL:  Well appearing HEENT:  Pupils equal round and reactive, fundi not visualized, oral mucosa unremarkable NECK:  No jugular venous distention, waveform within normal limits, carotid upstroke brisk and symmetric, no bruits, no thyromegaly LYMPHATICS:  No cervical, inguinal adenopathy LUNGS:  Clear to auscultation bilaterally BACK:  No CVA tenderness CHEST:  Unremarkable HEART:  PMI not displaced or sustained,S1 and S2 within normal limits, no S3, no S4, no clicks, no rubs, no murmurs ABD:  Flat, positive bowel  sounds normal in frequency in pitch, no bruits, no rebound, no guarding, no midline pulsatile mass, no hepatomegaly, no splenomegaly, obese EXT:  2 plus pulses throughout, trace edema, no cyanosis no clubbing SKIN:  No rashes no nodules NEURO:  Cranial nerves II through XII grossly intact, motor grossly intact throughout PSYCH:  Cognitively intact, oriented to person place and time   EKG:  Sinus rhythm, rate 79, axis within normal limits, intervals within normal limits, no acute ST-T wave changes. 01/17/2011   ASSESSMENT AND PLAN

## 2011-01-17 NOTE — Telephone Encounter (Signed)
Called and would like to get his Live Biopsy results. The return phone number is [phone].

## 2011-01-17 NOTE — Telephone Encounter (Signed)
Patient has office visit Monday

## 2011-01-17 NOTE — Assessment & Plan Note (Signed)
He seems to be euvolemic. No further imaging is indicated at this point.

## 2011-01-17 NOTE — Telephone Encounter (Signed)
Per Dr. Laural Golden, schedule an appt for next week for results. Appt has been schedule for Monday, 01/22/11 at 11:00 am.

## 2011-01-17 NOTE — Patient Instructions (Signed)
The current medical regimen is effective;  continue present plan and medications.  Follow up in 1 year with Dr Hochrein.  You will receive a letter in the mail 2 months before you are due.  Please call us when you receive this letter to schedule your follow up appointment.  

## 2011-01-19 ENCOUNTER — Telehealth: Payer: Self-pay | Admitting: Cardiology

## 2011-01-19 NOTE — Telephone Encounter (Signed)
Pt calling wanting to know if it is OK to take Vicoprofen.  Per Dr Percival Spanish in is ok - pt is aware

## 2011-01-19 NOTE — Telephone Encounter (Signed)
New Problem  Patient is currently at the pharmacy with concerns about medication, would like a return call to address concerns on cell # [phone]

## 2011-01-22 ENCOUNTER — Ambulatory Visit (INDEPENDENT_AMBULATORY_CARE_PROVIDER_SITE_OTHER): Payer: Medicare Other | Admitting: Internal Medicine

## 2011-01-22 ENCOUNTER — Encounter (INDEPENDENT_AMBULATORY_CARE_PROVIDER_SITE_OTHER): Payer: Self-pay | Admitting: Internal Medicine

## 2011-01-22 ENCOUNTER — Encounter (INDEPENDENT_AMBULATORY_CARE_PROVIDER_SITE_OTHER): Payer: Self-pay | Admitting: *Deleted

## 2011-01-22 ENCOUNTER — Other Ambulatory Visit (INDEPENDENT_AMBULATORY_CARE_PROVIDER_SITE_OTHER): Payer: Self-pay | Admitting: *Deleted

## 2011-01-22 VITALS — BP 130/92 | HR 84 | Temp 98.5°F | Ht 72.0 in | Wt 375.0 lb

## 2011-01-22 DIAGNOSIS — K7460 Unspecified cirrhosis of liver: Secondary | ICD-10-CM

## 2011-01-22 DIAGNOSIS — K7030 Alcoholic cirrhosis of liver without ascites: Secondary | ICD-10-CM

## 2011-01-22 LAB — HEPATIC FUNCTION PANEL
ALT: 22 U/L (ref 0–53)
AST: 29 U/L (ref 0–37)
Bilirubin, Direct: 0.4 mg/dL — ABNORMAL HIGH (ref 0.0–0.3)
Indirect Bilirubin: 0.6 mg/dL (ref 0.0–0.9)
Total Protein: 6.5 g/dL (ref 6.0–8.3)

## 2011-01-22 NOTE — Patient Instructions (Addendum)
Follow up in 6 months with a hepatic function.  Diet and exericise.  Stop drinking.

## 2011-01-22 NOTE — Progress Notes (Addendum)
Subjective:     Patient ID: [name], male   DOB: 08-24-1965, 46 y.o.   MRN: [id number] Here today for results of liver biopsy. HPI Lean is a 80 old male here today for a scheduled visit. He recently underwent a liver biopsy which revealed hepatic cirrhosis. Trichrome stain demonstrates diffuse bridging fibrosis with nodular formation.  Pas Stain negative for Alpha 1 antitrypsin. Iron stain is negative.  Appetite is good. Intentional weight loss. No abdominal  He has lost 25 pounds since his last visit intentional. He was  referred to our office by [name] for hepatomegaly and elevated liver enzymes. He sees Dr. Sonny Gray for low WBC ct and these records are not available. I will request them. He tells me he has spleenomegaly and his liver is inflamed. He was taking the Colchicine twice a day.  He tells me he has lost 20 pounds in the past month intentional. Appetite is good. No unintentional weight loss. No abdominal pai. He has a BM every day or twice a day. No melena or bright red rectal bleeding.  01/05/2011 H and H 11.1 and 34.5, Platelet ct 65. 11/10/2010 ALP 70 AST 72, ALT 46  01/26/2009 H and H 13.6 and 38.6, Platelet ct low at 74.  01/05/2011 11.1 and 34.5, Platelet ct 65 11/14/2010 US abdomen: Liver : Hepatomegaly noted with liver measuring 31cm in length. Spleen: Measure 19cm in length. Normal echogenicity. Normal CBD 5.36m  Family history of colon cancer (mother ). He last colonoscopy was in 2010 by [facility].  Review of Systems  Review of Systems see hpi     Objective:   Physical Exam deferred    Assessment:    Alcoholic vs nonalcoholic liver cirrhoisis. Surveillance EGD for Varices    Plan:     Diet and exercise. Will repeat Hepatic profile in 6 months. OV in 6 month, He needs to stop drinking.

## 2011-02-02 ENCOUNTER — Encounter (HOSPITAL_COMMUNITY): Payer: Self-pay | Admitting: Pharmacy Technician

## 2011-02-08 ENCOUNTER — Ambulatory Visit: Payer: Medicare Other | Attending: Family Medicine | Admitting: Physical Therapy

## 2011-02-08 DIAGNOSIS — R5381 Other malaise: Secondary | ICD-10-CM | POA: Insufficient documentation

## 2011-02-08 DIAGNOSIS — M6281 Muscle weakness (generalized): Secondary | ICD-10-CM | POA: Insufficient documentation

## 2011-02-08 DIAGNOSIS — IMO0001 Reserved for inherently not codable concepts without codable children: Secondary | ICD-10-CM | POA: Insufficient documentation

## 2011-02-08 DIAGNOSIS — R269 Unspecified abnormalities of gait and mobility: Secondary | ICD-10-CM | POA: Insufficient documentation

## 2011-02-12 ENCOUNTER — Ambulatory Visit: Payer: Medicare Other | Attending: Family Medicine | Admitting: Physical Therapy

## 2011-02-12 DIAGNOSIS — R5381 Other malaise: Secondary | ICD-10-CM | POA: Insufficient documentation

## 2011-02-12 DIAGNOSIS — IMO0001 Reserved for inherently not codable concepts without codable children: Secondary | ICD-10-CM | POA: Insufficient documentation

## 2011-02-12 DIAGNOSIS — M6281 Muscle weakness (generalized): Secondary | ICD-10-CM | POA: Insufficient documentation

## 2011-02-12 DIAGNOSIS — R269 Unspecified abnormalities of gait and mobility: Secondary | ICD-10-CM | POA: Insufficient documentation

## 2011-02-14 ENCOUNTER — Encounter: Payer: Medicare Other | Admitting: Physical Therapy

## 2011-02-14 MED ORDER — SODIUM CHLORIDE 0.45 % IV SOLN
Freq: Once | INTRAVENOUS | Status: AC
  Administered 2011-02-28: 11:00:00 via INTRAVENOUS

## 2011-02-19 ENCOUNTER — Encounter: Payer: Medicare Other | Admitting: Physical Therapy

## 2011-02-21 ENCOUNTER — Encounter: Payer: Medicare Other | Admitting: Physical Therapy

## 2011-02-28 ENCOUNTER — Other Ambulatory Visit (INDEPENDENT_AMBULATORY_CARE_PROVIDER_SITE_OTHER): Payer: Self-pay | Admitting: Internal Medicine

## 2011-02-28 ENCOUNTER — Ambulatory Visit (HOSPITAL_COMMUNITY)
Admission: RE | Admit: 2011-02-28 | Discharge: 2011-02-28 | Disposition: A | Discharge status: Home / Self Care | Payer: Medicare Other | Source: Ambulatory Visit | Attending: Internal Medicine | Admitting: Internal Medicine

## 2011-02-28 ENCOUNTER — Encounter (HOSPITAL_COMMUNITY)
Admission: RE | Disposition: A | Discharge status: Home / Self Care | Payer: Self-pay | Source: Ambulatory Visit | Attending: Internal Medicine

## 2011-02-28 ENCOUNTER — Encounter (HOSPITAL_COMMUNITY): Payer: Self-pay | Admitting: *Deleted

## 2011-02-28 ENCOUNTER — Telehealth: Payer: Self-pay | Admitting: *Deleted

## 2011-02-28 DIAGNOSIS — K228 Other specified diseases of esophagus: Secondary | ICD-10-CM

## 2011-02-28 DIAGNOSIS — J4489 Other specified chronic obstructive pulmonary disease: Secondary | ICD-10-CM | POA: Insufficient documentation

## 2011-02-28 DIAGNOSIS — D131 Benign neoplasm of stomach: Secondary | ICD-10-CM | POA: Insufficient documentation

## 2011-02-28 DIAGNOSIS — I8500 Esophageal varices without bleeding: Secondary | ICD-10-CM

## 2011-02-28 DIAGNOSIS — K7030 Alcoholic cirrhosis of liver without ascites: Secondary | ICD-10-CM | POA: Insufficient documentation

## 2011-02-28 DIAGNOSIS — Z01812 Encounter for preprocedural laboratory examination: Secondary | ICD-10-CM | POA: Insufficient documentation

## 2011-02-28 DIAGNOSIS — Z79899 Other long term (current) drug therapy: Secondary | ICD-10-CM | POA: Insufficient documentation

## 2011-02-28 DIAGNOSIS — E119 Type 2 diabetes mellitus without complications: Secondary | ICD-10-CM | POA: Insufficient documentation

## 2011-02-28 DIAGNOSIS — K766 Portal hypertension: Secondary | ICD-10-CM

## 2011-02-28 DIAGNOSIS — K7460 Unspecified cirrhosis of liver: Secondary | ICD-10-CM

## 2011-02-28 DIAGNOSIS — K319 Disease of stomach and duodenum, unspecified: Secondary | ICD-10-CM | POA: Insufficient documentation

## 2011-02-28 DIAGNOSIS — K2289 Other specified disease of esophagus: Secondary | ICD-10-CM

## 2011-02-28 DIAGNOSIS — J449 Chronic obstructive pulmonary disease, unspecified: Secondary | ICD-10-CM | POA: Insufficient documentation

## 2011-02-28 DIAGNOSIS — K745 Biliary cirrhosis, unspecified: Secondary | ICD-10-CM

## 2011-02-28 DIAGNOSIS — I4891 Unspecified atrial fibrillation: Secondary | ICD-10-CM | POA: Insufficient documentation

## 2011-02-28 HISTORY — DX: Anxiety disorder, unspecified: F41.9

## 2011-02-28 HISTORY — PX: ESOPHAGOGASTRODUODENOSCOPY: SHX5428

## 2011-02-28 HISTORY — DX: Unspecified cirrhosis of liver: K74.60

## 2011-02-28 LAB — GLUCOSE, CAPILLARY

## 2011-02-28 SURGERY — EGD (ESOPHAGOGASTRODUODENOSCOPY)
Anesthesia: Moderate Sedation

## 2011-02-28 MED ORDER — MIDAZOLAM HCL 5 MG/5ML IJ SOLN
INTRAMUSCULAR | Status: AC
  Filled 2011-02-28: qty 10

## 2011-02-28 MED ORDER — MEPERIDINE HCL 50 MG/ML IJ SOLN
INTRAMUSCULAR | Status: AC
  Filled 2011-02-28: qty 1

## 2011-02-28 MED ORDER — MEPERIDINE HCL 25 MG/ML IJ SOLN
INTRAMUSCULAR | Status: DC | PRN
  Administered 2011-02-28 (×2): 25 mg via INTRAVENOUS

## 2011-02-28 MED ORDER — BUTAMBEN-TETRACAINE-BENZOCAINE 2-2-14 % EX AERO
INHALATION_SPRAY | CUTANEOUS | Status: DC | PRN
  Administered 2011-02-28: 2 via TOPICAL

## 2011-02-28 MED ORDER — STERILE WATER FOR IRRIGATION IR SOLN
Status: DC | PRN
  Administered 2011-02-28: 11:00:00

## 2011-02-28 MED ORDER — MIDAZOLAM HCL 5 MG/5ML IJ SOLN
INTRAMUSCULAR | Status: DC | PRN
  Administered 2011-02-28: 2 mg via INTRAVENOUS
  Administered 2011-02-28: 3 mg via INTRAVENOUS
  Administered 2011-02-28 (×2): 2 mg via INTRAVENOUS
  Administered 2011-02-28 (×2): 3 mg via INTRAVENOUS

## 2011-02-28 NOTE — H&P (Addendum)
[name] is an 46 y.o. male.   Chief Complaint: Patient is here for esophagogastroduodenoscopy. HPI: Patient is a 46 year old Caucasian male with multiple medical problems who was recently evaluated for liver disease and confirmed to have cirrhosis. His cirrhosis is felt to be secondary to combination of alcohol and known alcoholic fatty liver disease. Since he has quit drinking alcohol he has lost over 40 pounds. He is undergoing EGD looking for esophageal or gastric varices.  Past Medical History  Diagnosis Date  . Gout   . Diabetes mellitus     type 2  . RA (rheumatoid arthritis)   . Cardiac arrhythmia due to congenital heart disease   . COPD (chronic obstructive pulmonary disease)     with cpap  . Atrial fibrillation   . Cirrhosis   . Anxiety     Past Surgical History  Procedure Date  . Sciatic nerve exploration   . Hernia repair   . Tonsillectomy     Family History  Problem Relation Age of Onset  . Colon cancer Mother    Social History:  reports that he has never smoked. His smokeless tobacco use includes Snuff and Chew. He reports that he drinks alcohol. He reports that he does not use illicit drugs.  Allergies:  Allergies  Allergen Reactions  . Oxycodone Base Itching and Nausea And Vomiting  . Prozac (Fluoxetine Hcl) Itching    Medications Prior to Admission  Medication Dose Route Frequency Provider Last Rate Last Dose  . 0.45 % sodium chloride infusion   Intravenous Once Rogene Houston, MD 20 mL/hr at 02/28/11 1117    . meperidine (DEMEROL) 50 MG/ML injection           . midazolam (VERSED) 5 MG/5ML injection           . simethicone susp in sterile water 1000 mL irrigation    PRN Rogene Houston, MD       Medications Prior to Admission  Medication Sig Dispense Refill  . albuterol (PROVENTIL HFA;VENTOLIN HFA) 108 (90 BASE) MCG/ACT inhaler Inhale 2 puffs into the lungs every 6 (six) hours as needed. For shortness of breath        . ALPRAZolam (XANAX) 0.5  MG tablet Take 1 mg by mouth every 4 (four) hours as needed.       Marland Kitchen aspirin 325 MG buffered tablet Take 325 mg by mouth daily.        . Cyanocobalamin (VITAMIN B-12 PO) Take 1 tablet by mouth daily.      Marland Kitchen diltiazem (CARDIZEM) 90 MG tablet Take 90 mg by mouth 3 (three) times daily.       Marland Kitchen ezetimibe (ZETIA) 10 MG tablet Take 10 mg by mouth daily.        . ferrous sulfate 325 (65 FE) MG EC tablet Take 325 mg by mouth daily.      . Fluticasone-Salmeterol (ADVAIR) 250-50 MCG/DOSE AEPB Inhale 1 puff into the lungs daily.        . folic acid (FOLVITE) 263 MCG tablet Take 400 mcg by mouth daily.      . furosemide (LASIX) 40 MG tablet Take 40 mg by mouth 2 (two) times daily. Patient unsure of dosage- takes 2 tabs in the morning and 1 tab at night      . HYDROcodone-ibuprofen (VICOPROFEN) 7.5-200 MG per tablet Take 1 tablet by mouth every 6 (six) hours as needed. For pain      . ipratropium (ATROVENT) 0.02 % nebulizer  solution Take 500 mcg by nebulization 4 (four) times daily as needed. For wheezing      . metFORMIN (GLUCOPHAGE) 500 MG tablet Take 500-1,000 mg by mouth 2 (two) times daily. Takes 1000 mg in the am and 500 mg at night      . milk thistle 175 MG tablet Take 175 mg by mouth daily.      . Multiple Vitamin (MULITIVITAMIN WITH MINERALS) TABS Take 1 tablet by mouth daily.      Marland Kitchen omeprazole (PRILOSEC) 20 MG capsule Take 20 mg by mouth 2 (two) times daily.      . potassium chloride SA (K-DUR,KLOR-CON) 20 MEQ tablet Take 20 mEq by mouth daily.       . pregabalin (LYRICA) 150 MG capsule Take 150 mg by mouth 2 (two) times daily.        Marland Kitchen testosterone (ANDROGEL) 50 MG/5GM GEL Place 5 g onto the skin daily.        Marland Kitchen tiotropium (SPIRIVA) 18 MCG inhalation capsule Place 18 mcg into inhaler and inhale daily.        Marland Kitchen zolpidem (AMBIEN) 10 MG tablet Take 10 mg by mouth at bedtime as needed. For sleep       . Aspirin-Salicylamide-Caffeine (BC HEADACHE POWDER PO) Take 1 packet by mouth daily as needed. For  pain       . ergocalciferol (VITAMIN D2) 50000 UNITS capsule Take 50,000 Units by mouth 2 (two) times a week.       . topiramate (TOPAMAX) 100 MG tablet Take 100 mg by mouth daily.          Results for orders placed during the hospital encounter of 02/28/11 (from the past 48 hour(s))  GLUCOSE, CAPILLARY     Status: Normal   Collection Time   02/28/11 11:12 AM      Component Value Range Comment   Glucose-Capillary 91  70 - 99 (mg/dL)    Comment 1 Documented in Chart      No results found.  Review of Systems  HENT: Positive for nosebleeds.   Gastrointestinal: Negative for abdominal pain, diarrhea, constipation, blood in stool and melena.    Blood pressure 113/74, pulse 84, temperature 97.9 F (36.6 C), temperature source Oral, resp. rate 16, height 6' (1.829 m), weight 375 lb (170.099 kg), SpO2 96.00%. Physical Exam  Constitutional: He appears well-developed.       Obese Caucasian male in NAD  HENT:  Mouth/Throat: Oropharynx is clear and moist.  Cardiovascular: Normal rate, regular rhythm and normal heart sounds.   No murmur heard. Respiratory: Effort normal and breath sounds normal.  GI: Soft. He exhibits no distension. There is no tenderness.       Small umbilical hernia with erythema to the skin. This hernia is completely reducible. Abdomen is obese and difficult to feel liver or spleen edge.  Musculoskeletal: Edema: trace edema around ankles.  Neurological: He is alert.  Skin: Skin is warm and dry.     Assessment/Plan Cirrhosis. EGD looking for varices.  Lyndal Alamillo U 02/28/2011, 11:42 AM

## 2011-02-28 NOTE — Op Note (Addendum)
EGD PROCEDURE REPORT  PATIENT:  [name]  MR#:  [id number] Birthdate:  05/05/1965, 46 y.o., male Endoscopist:  Dr. Rogene Houston, MD Referred By:   Chriss Driver. Duane Boston, MD Procedure Date: 02/28/2011  Procedure:   EGD  Indications:  Patient is 46 year old Caucasian male with multiple medical problems including morbid obesity who was recently diagnosed with advanced cirrhosis. Etiology felt to be both ASH and NASH. He is undergoing EGD looking for esophageal  or gastric varices.            Informed Consent:   Procedure and risks were reviewed with the patient and informed consent was obtained.  Medications:  Demerol 50 mg IV Versed 15 mg IV Cetacaine spray topically for oropharyngeal anesthesia  Description of procedure:  The endoscope was introduced through the mouth and advanced to the second portion of the duodenum without difficulty or limitations. The mucosal surfaces were surveyed very carefully during advancement of the scope and upon withdrawal.  Findings:  Esophagus:  Mucosa of the proximal and middle segment was normal. Distally 3 columns of grade 1 esophageal varices were identified. GEJ:  43cm Stomach:  Stomach was empty and distended very well with insufflation. Folds in the proximal stomach were normal. Examination mucosa and body revealed mosaic pattern or snake skinning. In the prepyloric region there was a cluster of small polyps measuring 4-8 mm in most of these were eroded but no active bleeding noted. Angularis fundus and cardia was examined by retroflex in the scope and were normal. Duodenum:  Normal bulbar and post bulbar mucosa.  Therapeutic/Diagnostic Maneuvers Performed:  Biopsy taken from antral nodules.  Complications:  None  Impression: Three columns of grade 1 esophageal varices. Mild portal gastropathy. Cluster of prepyloric/antral nodules with eroded surface. 3 of these were biopsied for routine histology.  Recommendations:  Patient advised to  continue to make an effort to lose weight. He needs to come off NSAID(he is on Vicoprofen). If possible dose of aspirin could be reduced to 81 or 162 mg daily. I have talked with Dr. Rosezella [place] nurse and she will call patient after checking with him next week when he returns from vacation. Physician will call you with biopsy results. Patient will return for office visit in 4 months.   Meoshia Billing U  02/28/2011  12:23 PM  CC: Dr. Gearlean Alf., PA, PA & Dr. Rayne Du ref. provider found Dr. Minus Breeding, MD.

## 2011-02-28 NOTE — Telephone Encounter (Signed)
Dr Laural Golden would like for the pt to decrease his ASA to 81 mg a day and if not to 162 mg due to low platelets.  Will forward to Dr Percival Spanish for recommendations

## 2011-03-02 ENCOUNTER — Telehealth: Payer: Self-pay | Admitting: Cardiology

## 2011-03-02 NOTE — Telephone Encounter (Signed)
OK to reduce to 81 mg daily.

## 2011-03-02 NOTE — Telephone Encounter (Signed)
Pt calling re stopping taking 378m asa and switchto baby asa, wants to know how many to take a day?

## 2011-03-02 NOTE — Telephone Encounter (Signed)
Patient called stated Dr.Rehman wants him to take baby aspirin instead of 325 mg aspirin.Advised to take aspirin 81 mg daily, will forward to Dr.Hochrein.

## 2011-03-05 ENCOUNTER — Ambulatory Visit: Payer: Medicare Other | Admitting: Physical Therapy

## 2011-03-05 NOTE — Telephone Encounter (Signed)
Pt aware to decrease ASA was ordered.

## 2011-03-06 ENCOUNTER — Encounter (INDEPENDENT_AMBULATORY_CARE_PROVIDER_SITE_OTHER): Payer: Self-pay | Admitting: *Deleted

## 2011-03-07 ENCOUNTER — Ambulatory Visit: Payer: Medicare Other | Admitting: Physical Therapy

## 2011-03-07 ENCOUNTER — Encounter (HOSPITAL_COMMUNITY): Payer: Self-pay | Admitting: Internal Medicine

## 2011-03-08 ENCOUNTER — Ambulatory Visit (INDEPENDENT_AMBULATORY_CARE_PROVIDER_SITE_OTHER): Payer: Medicare Other | Admitting: Internal Medicine

## 2011-04-23 ENCOUNTER — Ambulatory Visit (INDEPENDENT_AMBULATORY_CARE_PROVIDER_SITE_OTHER): Payer: Medicare Other | Admitting: Internal Medicine

## 2011-04-25 ENCOUNTER — Other Ambulatory Visit (INDEPENDENT_AMBULATORY_CARE_PROVIDER_SITE_OTHER): Payer: Self-pay | Admitting: *Deleted

## 2011-04-25 ENCOUNTER — Encounter (INDEPENDENT_AMBULATORY_CARE_PROVIDER_SITE_OTHER): Payer: Self-pay | Admitting: Internal Medicine

## 2011-04-25 ENCOUNTER — Telehealth (INDEPENDENT_AMBULATORY_CARE_PROVIDER_SITE_OTHER): Payer: Self-pay | Admitting: *Deleted

## 2011-04-25 ENCOUNTER — Ambulatory Visit (INDEPENDENT_AMBULATORY_CARE_PROVIDER_SITE_OTHER): Payer: Medicare Other | Admitting: Internal Medicine

## 2011-04-25 VITALS — BP 90/50 | HR 60 | Temp 97.9°F | Ht 72.0 in | Wt 383.4 lb

## 2011-04-25 DIAGNOSIS — K625 Hemorrhage of anus and rectum: Secondary | ICD-10-CM

## 2011-04-25 MED ORDER — PEG-KCL-NACL-NASULF-NA ASC-C 100 G PO SOLR: 1.0000 | Freq: Once | ORAL | Status: DC

## 2011-04-25 NOTE — Progress Notes (Signed)
Subjective:     Patient ID: [name], male   DOB: 08-28-1965, 46 y.o.   MRN: [id number]  HPI Here today for f/u for recent admission to [facility] for rectal bleeding 04/06/2011.  Rectal bleeding last 5 days. He says he was not constipated. He says the water in the toilet turned red.  He also noticed blood in his stool. He was heme positive in the ED.  He was admitted for further evaluation. He says he feels okay now.  He has lost 8 pounds since his last visit which was intentional. No hematemesis. No melena.  He usually goes to the bathroom every 2-3 days.  Admission hemoglobin was 11.4. Serial hemoglobins were done, and the last hemoglobin on 04/10/2011 was 10.5.  Platelet ct low at 42 which is chronic. He has a hx of cirrhosis. Liver functions were all normal in the hospital.  He also c/o rt upper quadrant pain.  Noted on admission to have a 2 x 2 cm area of ecchymosis on the rt upper outer abdomen. He underwent a liver biopsy 12/2010. Diffuse bridging fibrosis with nodular formation.  Negative for alpha 1 antitrypsin deposition. Iron stain is negative. CT scan on admission show small umbilical hernia containing only a small amount of omental fat. Changes indicative of early cirrhosis with portal hypertension noted. Chronic diverticulosis noted. Abdominal US showed no shadow of gallstones or echogenic sludge.  Negative sonographic Murphy's sign. Common bile duct 5.2 in diameter. His last colonoscopy was in 2010 at [facility]: revealed tubular adenoma. No high grade dysplasia.   2/2013EGD Impression:  Three columns of grade 1 esophageal varices.  Mild portal gastropathy.  Cluster of prepyloric/antral nodules with eroded surface. 3 of these were biopsied for routine histology.   04/20/2011 H and H 12.3 and 38.0, MCV 87.2  Review of Systems see hpi     Objective:   Physical Exam Filed Vitals:   04/25/11 1140  Height: 6' (1.829 m)  Weight: 383 lb 6.4 oz (173.909 kg)   Alert and  oriented. Skin warm and dry. Oral mucosa is moist.   . Sclera anicteric, conjunctivae is pink. Thyroid not enlarged. No cervical lymphadenopathy. Lungs clear. Heart regular rate and rhythm.  Abdomen is soft. Bowel sounds are positive. Unable to palpate liver or spleen, patient is morbidily obese.. No abdominal masses felt. No tenderness. 3+edema to lower extremities. Patient is alert and oriented.      Assessment:   Hx of rectal bleeding. Slight drop in hemoglobin during admission. Hemoglobin on discharge was 10.5 Colonic neoplasm needs to be ruled out as well as diverticular bleed    Plan:    Will schedule a colonoscopy with Dr. Laural Golden. Will get recent blood from [facility] in [place].

## 2011-04-25 NOTE — Telephone Encounter (Signed)
Patient needs movi prep 

## 2011-04-25 NOTE — Patient Instructions (Signed)
Will get lab work from primary care that was done Monday. Colonoscopy with Dr. Laural Golden.

## 2011-05-07 MED ORDER — SODIUM CHLORIDE 0.45 % IV SOLN
Freq: Once | INTRAVENOUS | Status: AC
  Administered 2011-05-08: 07:00:00 via INTRAVENOUS

## 2011-05-08 ENCOUNTER — Encounter (HOSPITAL_COMMUNITY): Payer: Self-pay | Admitting: *Deleted

## 2011-05-08 ENCOUNTER — Ambulatory Visit (HOSPITAL_COMMUNITY)
Admission: RE | Admit: 2011-05-08 | Discharge: 2011-05-08 | Disposition: A | Discharge status: Home / Self Care | Payer: Medicare Other | Source: Ambulatory Visit | Attending: Internal Medicine | Admitting: Internal Medicine

## 2011-05-08 ENCOUNTER — Encounter (HOSPITAL_COMMUNITY)
Admission: RE | Disposition: A | Discharge status: Home / Self Care | Payer: Self-pay | Source: Ambulatory Visit | Attending: Internal Medicine

## 2011-05-08 DIAGNOSIS — K5730 Diverticulosis of large intestine without perforation or abscess without bleeding: Secondary | ICD-10-CM

## 2011-05-08 DIAGNOSIS — Z86010 Personal history of colonic polyps: Secondary | ICD-10-CM

## 2011-05-08 DIAGNOSIS — J4489 Other specified chronic obstructive pulmonary disease: Secondary | ICD-10-CM | POA: Insufficient documentation

## 2011-05-08 DIAGNOSIS — K922 Gastrointestinal hemorrhage, unspecified: Secondary | ICD-10-CM

## 2011-05-08 DIAGNOSIS — K648 Other hemorrhoids: Secondary | ICD-10-CM | POA: Insufficient documentation

## 2011-05-08 DIAGNOSIS — E119 Type 2 diabetes mellitus without complications: Secondary | ICD-10-CM | POA: Insufficient documentation

## 2011-05-08 DIAGNOSIS — D126 Benign neoplasm of colon, unspecified: Secondary | ICD-10-CM | POA: Insufficient documentation

## 2011-05-08 DIAGNOSIS — Z01812 Encounter for preprocedural laboratory examination: Secondary | ICD-10-CM | POA: Insufficient documentation

## 2011-05-08 DIAGNOSIS — K625 Hemorrhage of anus and rectum: Secondary | ICD-10-CM

## 2011-05-08 DIAGNOSIS — J449 Chronic obstructive pulmonary disease, unspecified: Secondary | ICD-10-CM | POA: Insufficient documentation

## 2011-05-08 HISTORY — PX: COLONOSCOPY: SHX5424

## 2011-05-08 LAB — GLUCOSE, CAPILLARY

## 2011-05-08 SURGERY — COLONOSCOPY
Anesthesia: Moderate Sedation

## 2011-05-08 MED ORDER — MIDAZOLAM HCL 5 MG/5ML IJ SOLN
INTRAMUSCULAR | Status: AC
  Filled 2011-05-08: qty 5

## 2011-05-08 MED ORDER — MEPERIDINE HCL 50 MG/ML IJ SOLN
INTRAMUSCULAR | Status: AC
  Filled 2011-05-08: qty 1

## 2011-05-08 MED ORDER — MIDAZOLAM HCL 5 MG/5ML IJ SOLN
INTRAMUSCULAR | Status: AC
  Filled 2011-05-08: qty 10

## 2011-05-08 MED ORDER — SIMETHICONE 40 MG/0.6ML PO SUSP
ORAL | Status: DC | PRN
  Administered 2011-05-08: 08:00:00

## 2011-05-08 MED ORDER — MIDAZOLAM HCL 5 MG/5ML IJ SOLN
INTRAMUSCULAR | Status: DC | PRN
  Administered 2011-05-08: 4 mg via INTRAVENOUS
  Administered 2011-05-08 (×2): 3 mg via INTRAVENOUS
  Administered 2011-05-08 (×2): 2 mg via INTRAVENOUS

## 2011-05-08 MED ORDER — MEPERIDINE HCL 50 MG/ML IJ SOLN
INTRAMUSCULAR | Status: DC | PRN
  Administered 2011-05-08 (×2): 25 mg via INTRAVENOUS

## 2011-05-08 NOTE — Op Note (Signed)
COLONOSCOPY PROCEDURE REPORT  PATIENT:  [name]  MR#:  [id number] Birthdate:  20-Mar-1965, 46 y.o., male Endoscopist:  Dr. Rogene Houston, MD Referred By:  Chriss Driver. Duane Boston, [facility] Procedure Date: 05/08/2011  Procedure:   Colonoscopy with snare polypectomy and Hemoclip application.  Indications:  Patient is 46 year old Caucasian male with multiple medical problems recently experienced hematochezia for 5 days. He has history of colonic adenoma. His last exam was 3 years ago. He is undergoing diagnostic colonoscopy.  Informed Consent:  The procedure and risks were reviewed with the patient and informed consent was obtained.  Medications:  Demerol 50 mg IV Versed 14 mg IV  Description of procedure:  After a digital rectal exam was performed, that colonoscope was advanced from the anus through the rectum and colon to the area of the cecum, ileocecal valve and appendiceal orifice. The cecum was deeply intubated. These structures were well-seen and photographed for the record. From the level of the cecum and ileocecal valve, the scope was slowly and cautiously withdrawn. The mucosal surfaces were carefully surveyed utilizing scope tip to flexion to facilitate fold flattening as needed. The scope was pulled down into the rectum where a thorough exam including retroflexion was performed.  Findings:   Prep satisfactory. Two small polyps ablated via cold biopsy and submitted in one container. One was located at the ascending colon and the second one was the transverse colon. 6 mm polyp snared from proximal sigmoid colon and Hemoclip applied to polypectomy site. 10 mm polyp snared from proximal sigmoid colon and Hemoclip applied to polypectomy site. Both of these polyps were submitted together. Few diverticula at sigmoid colon. Normal rectal mucosa. Hemorrhoids noted above the dentate line.    Therapeutic/Diagnostic Maneuvers Performed:  See above  Complications:  None  Cecal Withdrawal  Time:  26 minutes  Impression:  Examination performed to cecum. Two small polyps ablated via cold biopsy and submitted together(ascending and transverse colon). 6 mm and 10 mm polyp snared from proximal sigmoid colon and submitted together. One Hemoclip applied to each site to reduce the risk of post-polypectomy bleed given he has thrombocytopenia and portal hypertension. Mild sigmoid colon diverticulosis. Internal hemorrhoids  Recommendations:  Standard instructions given. I will be contacting patient with the results of biopsy.  Allanah Mcfarland U  05/08/2011 8:30 AM  CC: Dr. Gearlean Alf., PA, PA & Dr. Rayne Du ref. provider found

## 2011-05-08 NOTE — Discharge Instructions (Signed)
No aspirin for one week. Resume usual medications and diet. No driving for [id number]. Physician will contact you with biopsy results. Remember you cannot have an MRI until you have passed hemoclips.  Colonoscopy A colonoscopy is an exam to evaluate your entire colon. In this exam, your colon is cleansed. A long fiberoptic tube is inserted through your rectum and into your colon. The fiberoptic scope (endoscope) is a long bundle of enclosed and very flexible fibers. These fibers transmit light to the area examined and send images from that area to your caregiver. Discomfort is usually minimal. You may be given a drug to help you sleep (sedative) during or prior to the procedure. This exam helps to detect lumps (tumors), polyps, inflammation, and areas of bleeding. Your caregiver may also take a small piece of tissue (biopsy) that will be examined under a microscope. LET YOUR CAREGIVER KNOW ABOUT:   Allergies to food or medicine.   Medicines taken, including vitamins, herbs, eyedrops, over-the-counter medicines, and creams.   Use of steroids (by mouth or creams).   Previous problems with anesthetics or numbing medicines.   History of bleeding problems or blood clots.   Previous surgery.   Other health problems, including diabetes and kidney problems.   Possibility of pregnancy, if this applies.  BEFORE THE PROCEDURE   A clear liquid diet may be required for 2 days before the exam.   Ask your caregiver about changing or stopping your regular medications.   Liquid injections (enemas) or laxatives may be required.   A large amount of electrolyte solution may be given to you to drink over a short period of time. This solution is used to clean out your colon.   You should be present 60 minutes prior to your procedure or as directed by your caregiver.  AFTER THE PROCEDURE   If you received a sedative or pain relieving medication, you will need to arrange for someone to drive you home.     Occasionally, there is a little blood passed with the first bowel movement. Do not be concerned.  FINDING OUT THE RESULTS OF YOUR TEST Not all test results are available during your visit. If your test results are not back during the visit, make an appointment with your caregiver to find out the results. Do not assume everything is normal if you have not heard from your caregiver or the medical facility. It is important for you to follow up on all of your test results. HOME CARE INSTRUCTIONS   It is not unusual to pass moderate amounts of gas and experience mild abdominal cramping following the procedure. This is due to air being used to inflate your colon during the exam. Walking or a warm pack on your belly (abdomen) may help.   You may resume all normal meals and activities after sedatives and medicines have worn off.   Only take over-the-counter or prescription medicines for pain, discomfort, or fever as directed by your caregiver. Do not use aspirin or blood thinners if a biopsy was taken. Consult your caregiver for medicine usage if biopsies were taken.  SEEK IMMEDIATE MEDICAL CARE IF:   You have a fever.   You pass large blood clots or fill a toilet with blood following the procedure. This may also occur 10 to 14 days following the procedure. This is more likely if a biopsy was taken.   You develop abdominal pain that keeps getting worse and cannot be relieved with medicine.  Document Released: 12/23/1999 Document Revised:  12/14/2010 Document Reviewed: 08/07/2007 [facility] Patient Information 2012 [place].

## 2011-05-08 NOTE — H&P (Signed)
[name] is an 46 y.o. male.   Chief Complaint: Patient is here for colonoscopy. HPI: Patient is a 46 year old Caucasian male with multiple medical problems including cirrhosis secondary to NAFLD, morbid obesity and history of colonic adenoma who experienced fogginess or rectal bleeding recently. He says his bowels generally move every 3-4 days he is not constipated. His last colonoscopy was about 3 years ago. History is negative for colorectal carcinoma.  Past Medical History  Diagnosis Date  . Gout   . Diabetes mellitus     type 2  . RA (rheumatoid arthritis)   . Cardiac arrhythmia due to congenital heart disease   . COPD (chronic obstructive pulmonary disease)     with cpap  . Atrial fibrillation   . Cirrhosis   . Anxiety     Past Surgical History  Procedure Date  . Sciatic nerve exploration   . Hernia repair   . Tonsillectomy   . Esophagogastroduodenoscopy 02/28/2011    Procedure: ESOPHAGOGASTRODUODENOSCOPY (EGD);  Surgeon: Rogene Houston, MD;  Location: [facility];  Service: Endoscopy;  Laterality: N/A;  1200    Family History  Problem Relation Age of Onset  . Colon cancer Mother    Social History:  reports that he has never smoked. His smokeless tobacco use includes Snuff and Chew. He reports that he drinks alcohol. He reports that he does not use illicit drugs.  Allergies:  Allergies  Allergen Reactions  . Oxycodone Base Itching and Nausea And Vomiting  . Prozac (Fluoxetine Hcl) Itching    Medications Prior to Admission  Medication Sig Dispense Refill  . albuterol (PROVENTIL HFA;VENTOLIN HFA) 108 (90 BASE) MCG/ACT inhaler Inhale 2 puffs into the lungs every 6 (six) hours as needed. For shortness of breath        . ALPRAZolam (XANAX) 0.5 MG tablet Take 1 mg by mouth every 4 (four) hours as needed.       . Cyanocobalamin (VITAMIN B-12 PO) Take 1 tablet by mouth daily.      Marland Kitchen diltiazem (CARDIZEM) 90 MG tablet Take 90 mg by mouth 3 (three) times daily.        Marland Kitchen ezetimibe (ZETIA) 10 MG tablet Take 10 mg by mouth daily.        . ferrous sulfate 325 (65 FE) MG EC tablet Take 325 mg by mouth daily.      . folic acid (FOLVITE) 914 MCG tablet Take 400 mcg by mouth daily.      . furosemide (LASIX) 40 MG tablet Take 40 mg by mouth 2 (two) times daily. Patient unsure of dosage- takes 2 tabs in the morning and 1 tab at night      . HYDROcodone-ibuprofen (VICOPROFEN) 7.5-200 MG per tablet Take 1 tablet by mouth every 6 (six) hours as needed. For pain      . milk thistle 175 MG tablet Take 175 mg by mouth daily.      . Multiple Vitamin (MULITIVITAMIN WITH MINERALS) TABS Take 1 tablet by mouth daily.      Marland Kitchen omeprazole (PRILOSEC) 20 MG capsule Take 20 mg by mouth 2 (two) times daily.      . peg 3350 powder (MOVIPREP) SOLR Take 1 kit (100 g total) by mouth once.  1 kit  0  . potassium chloride SA (K-DUR,KLOR-CON) 20 MEQ tablet Take 20 mEq by mouth daily.       . pregabalin (LYRICA) 150 MG capsule Take 150 mg by mouth 2 (two) times daily.        Marland Kitchen  testosterone (ANDROGEL) 50 MG/5GM GEL Place 5 g onto the skin daily.        Marland Kitchen tiotropium (SPIRIVA) 18 MCG inhalation capsule Place 18 mcg into inhaler and inhale daily.        Marland Kitchen topiramate (TOPAMAX) 100 MG tablet Take 100 mg by mouth daily.        Marland Kitchen zolpidem (AMBIEN) 10 MG tablet Take 10 mg by mouth at bedtime as needed. For sleep       . Fluticasone-Salmeterol (ADVAIR) 250-50 MCG/DOSE AEPB Inhale 1 puff into the lungs daily.        Marland Kitchen ipratropium (ATROVENT) 0.02 % nebulizer solution Take 500 mcg by nebulization 4 (four) times daily as needed. For wheezing      . metFORMIN (GLUCOPHAGE) 500 MG tablet Take 500-1,000 mg by mouth 2 (two) times daily. Takes 1000 mg in the am and 500 mg at night        Results for orders placed during the hospital encounter of 05/08/11 (from the past 48 hour(s))  GLUCOSE, CAPILLARY     Status: Normal   Collection Time   05/08/11  7:18 AM      Component Value Range Comment    Glucose-Capillary 93  70 - 99 (mg/dL)    Comment 1 Documented in Chart      No results found.  Review of Systems  Constitutional: Negative for weight loss.  Gastrointestinal: Negative for abdominal pain, diarrhea, constipation and melena.    Blood pressure 122/71, pulse 79, temperature 97.8 F (36.6 C), temperature source Oral, resp. rate 18, height 6' (1.829 m), weight 383 lb (173.728 kg), SpO2 97.00%. Physical Exam  Constitutional:       Morbidly obese male  HENT:  Mouth/Throat: Oropharynx is clear and moist.  Eyes: Conjunctivae are normal. No scleral icterus.  Neck: No thyromegaly present.  Cardiovascular: Normal rate, regular rhythm and normal heart sounds.   No murmur heard. Respiratory: Effort normal and breath sounds normal.  GI: Soft. He exhibits no mass. There is no tenderness.       Small umbilical hernia with discoloration to skin  Musculoskeletal: He exhibits edema (1+LE edema.).  Lymphadenopathy:    He has no cervical adenopathy.  Neurological: He is alert.  Skin: Skin is warm and dry.     Assessment/Plan Rectal bleeding. History of colonic adenoma. Colonoscopy  Sabreen Kitchen U 05/08/2011, 7:35 AM

## 2011-05-11 ENCOUNTER — Encounter (INDEPENDENT_AMBULATORY_CARE_PROVIDER_SITE_OTHER): Payer: Self-pay | Admitting: *Deleted

## 2011-05-11 ENCOUNTER — Encounter (HOSPITAL_COMMUNITY): Payer: Self-pay | Admitting: Internal Medicine

## 2011-06-13 ENCOUNTER — Encounter (INDEPENDENT_AMBULATORY_CARE_PROVIDER_SITE_OTHER): Payer: Self-pay

## 2011-06-21 ENCOUNTER — Encounter (INDEPENDENT_AMBULATORY_CARE_PROVIDER_SITE_OTHER): Payer: Self-pay | Admitting: *Deleted

## 2011-06-27 ENCOUNTER — Encounter: Payer: Medicare Other | Admitting: Hematology and Oncology

## 2011-06-27 DIAGNOSIS — E119 Type 2 diabetes mellitus without complications: Secondary | ICD-10-CM

## 2011-06-27 DIAGNOSIS — R161 Splenomegaly, not elsewhere classified: Secondary | ICD-10-CM

## 2011-06-27 DIAGNOSIS — K7460 Unspecified cirrhosis of liver: Secondary | ICD-10-CM

## 2011-06-27 DIAGNOSIS — J449 Chronic obstructive pulmonary disease, unspecified: Secondary | ICD-10-CM

## 2011-07-02 ENCOUNTER — Ambulatory Visit: Payer: Medicare Other | Admitting: Physical Therapy

## 2011-07-18 ENCOUNTER — Ambulatory Visit: Payer: Medicare Other | Admitting: Physical Therapy

## 2011-07-23 ENCOUNTER — Ambulatory Visit: Payer: Medicare Other | Admitting: Physical Therapy

## 2011-07-26 ENCOUNTER — Ambulatory Visit: Payer: Medicare Other | Attending: Family Medicine | Admitting: Physical Therapy

## 2011-07-26 DIAGNOSIS — R5381 Other malaise: Secondary | ICD-10-CM | POA: Insufficient documentation

## 2011-07-26 DIAGNOSIS — IMO0001 Reserved for inherently not codable concepts without codable children: Secondary | ICD-10-CM | POA: Insufficient documentation

## 2011-07-26 DIAGNOSIS — M545 Low back pain, unspecified: Secondary | ICD-10-CM | POA: Insufficient documentation

## 2011-07-26 DIAGNOSIS — M6281 Muscle weakness (generalized): Secondary | ICD-10-CM | POA: Insufficient documentation

## 2011-07-28 DIAGNOSIS — A409 Streptococcal sepsis, unspecified: Secondary | ICD-10-CM

## 2011-07-30 ENCOUNTER — Encounter: Payer: Medicare Other | Admitting: Hematology and Oncology

## 2011-07-30 DIAGNOSIS — A419 Sepsis, unspecified organism: Secondary | ICD-10-CM

## 2011-07-31 ENCOUNTER — Encounter: Payer: Medicare Other | Admitting: *Deleted

## 2011-08-02 ENCOUNTER — Encounter: Payer: Medicare Other | Admitting: *Deleted

## 2011-08-07 ENCOUNTER — Ambulatory Visit (INDEPENDENT_AMBULATORY_CARE_PROVIDER_SITE_OTHER): Payer: Medicare Other | Admitting: Internal Medicine

## 2011-08-09 ENCOUNTER — Encounter: Payer: Medicare Other | Admitting: Hematology and Oncology

## 2011-08-10 ENCOUNTER — Telehealth: Payer: Self-pay | Admitting: Pulmonary Disease

## 2011-08-10 NOTE — Telephone Encounter (Signed)
Pt is aware of this and he is scheduled to see TP on 08/16/11 at 2:00 for f/u.

## 2011-08-10 NOTE — Telephone Encounter (Signed)
NO i did not call pt. Look like he has not been seen RA since 02/2010. So he would be due for f/u.

## 2011-08-10 NOTE — Telephone Encounter (Signed)
Spoke with pt. He states that nurse just called to let him know needs appt with either RA or TP "something about [facility]". I see no mention of this documented in his chart. [name], did you call him. Please advise thakns

## 2011-08-13 ENCOUNTER — Telehealth: Payer: Self-pay | Admitting: Pulmonary Disease

## 2011-08-13 NOTE — Telephone Encounter (Signed)
I spoke with pt and he was calling to confirm his appt for Thursday with TP. I advised him of date adn time. He states also [facility] is suppose to be sending over a cmn for RA to sign as well. Will sign off message

## 2011-08-14 ENCOUNTER — Ambulatory Visit (INDEPENDENT_AMBULATORY_CARE_PROVIDER_SITE_OTHER): Payer: Medicare Other | Admitting: Internal Medicine

## 2011-08-14 ENCOUNTER — Encounter (INDEPENDENT_AMBULATORY_CARE_PROVIDER_SITE_OTHER): Payer: Self-pay | Admitting: Internal Medicine

## 2011-08-14 VITALS — BP 106/72 | HR 72 | Temp 97.5°F | Ht 72.0 in | Wt 384.4 lb

## 2011-08-14 DIAGNOSIS — K7460 Unspecified cirrhosis of liver: Secondary | ICD-10-CM

## 2011-08-14 NOTE — Patient Instructions (Addendum)
Talk with PCP about stopping Xanax. Cmet, and PT/inr today. OV in 3 months. Keep appt with DR. Sonny Dandy

## 2011-08-14 NOTE — Progress Notes (Signed)
Subjective:     Patient ID: [name], male   DOB: [birth year], 46 y.o.   MRN: [id number]  HPI   Recently admitted to [facility] with septicemia secondary to streptococcus agalactiae.  Admitted with chills, dizziness, body aches, temp of 105. He tells me he feels better. His appetite is good. There has been no weight loss. He usually has a BM about one a day. No melena or bright red rectal bleedingh.  07/28/2011 H and H  12.6 and 36.9, MCV 88.4, platelet ct 30., WBC ct 3.8 Albumin 4.2, ALP 47, AST 41, ALT 23. Total bili 0.9.  Hx of chronic neutropenia and thrombocytopenia and is followed by Dr. Sonny Dandy.       05/08/2011 Colonoscopy: rectal bleeding. Dr. Rehman:Impression:  Examination performed to cecum.  Two small polyps ablated via cold biopsy and submitted together(ascending and transverse colon).  6 mm and 10 mm polyp snared from proximal sigmoid colon and submitted together. One Hemoclip applied to each site to reduce the risk of post-polypectomy bleed given he has thrombocytopenia and portal hypertension.  Mild sigmoid colon diverticulosis.  Internal hemorrhoids  Biopsy: Tubular adenomas x 3. Next colonoscopy in 6 yrs. EGD 2/013: searching for esophageal varices:Impression:  Three columns of grade 1 esophageal varices.  Mild portal gastropathy.  Cluster of prepyloric/antral nodules with eroded surface. 3 of these were biopsied for routine histology.  Liver Biopsy 01/05/2012: Hepatic cirrhosis. Trichrome stain demonstrates diffuse bridging fibrosis with nodular formation. PAS stain negative for Alpha 1 antitrypsin deposition. Iron stain is negative. No hepatocyte dysplasia is present.  Hepatis A,B,C markers were negative. .Review of Systems see hpi Current Outpatient Prescriptions  Medication Sig Dispense Refill  . albuterol (PROVENTIL HFA;VENTOLIN HFA) 108 (90 BASE) MCG/ACT inhaler Inhale 2 puffs into the lungs every 6 (six) hours as needed. For shortness of breath     . ALPRAZolam (XANAX) 0.5 MG tablet Take 1 mg by mouth every 4 (four) hours as needed.       . diltiazem (CARDIZEM) 90 MG tablet Take 90 mg by mouth 3 (three) times daily.       Marland Kitchen ezetimibe (ZETIA) 10 MG tablet Take 10 mg by mouth daily.        . Fluticasone-Salmeterol (ADVAIR) 250-50 MCG/DOSE AEPB Inhale 1 puff into the lungs daily.        . folic acid (FOLVITE) 782 MCG tablet Take 400 mcg by mouth daily.      . furosemide (LASIX) 40 MG tablet Take 40 mg by mouth 2 (two) times daily. Patient unsure of dosage- takes 2 tabs in the morning and 1 tab at night       . HYDROcodone-ibuprofen (VICOPROFEN) 7.5-200 MG per tablet Take 1 tablet by mouth every 6 (six) hours as needed. For pain       . ipratropium (ATROVENT) 0.02 % nebulizer solution Take 500 mcg by nebulization 4 (four) times daily as needed. For wheezing      . milk thistle 175 MG tablet Take 175 mg by mouth daily.      . Multiple Vitamin (MULITIVITAMIN WITH MINERALS) TABS Take 1 tablet by mouth daily.      Marland Kitchen omeprazole (PRILOSEC) 20 MG capsule Take 20 mg by mouth 2 (two) times daily.      . potassium chloride SA (K-DUR,KLOR-CON) 20 MEQ tablet Take 20 mEq by mouth daily.       . pregabalin (LYRICA) 150 MG capsule Take 150 mg by mouth 2 (two) times daily. One in  am and two and night      . testosterone (ANDROGEL) 50 MG/5GM GEL Place 5 g onto the skin daily.        Marland Kitchen tiotropium (SPIRIVA) 18 MCG inhalation capsule Place 18 mcg into inhaler and inhale daily.        Marland Kitchen topiramate (TOPAMAX) 100 MG tablet Take 100 mg by mouth daily.        Marland Kitchen zolpidem (AMBIEN) 10 MG tablet Take 10 mg by mouth at bedtime as needed. For sleep       . Cyanocobalamin (VITAMIN B-12 PO) Take 1 tablet by mouth daily.      . ferrous sulfate 325 (65 FE) MG EC tablet Take 325 mg by mouth daily.      . metFORMIN (GLUCOPHAGE) 500 MG tablet Take 500-1,000 mg by mouth 2 (two) times daily. Takes 1000 mg in the am and 500 mg at night       Current Outpatient Prescriptions on  File Prior to Visit  Medication Sig Dispense Refill  . albuterol (PROVENTIL HFA;VENTOLIN HFA) 108 (90 BASE) MCG/ACT inhaler Inhale 2 puffs into the lungs every 6 (six) hours as needed. For shortness of breath        . ALPRAZolam (XANAX) 0.5 MG tablet Take 1 mg by mouth every 4 (four) hours as needed.       . diltiazem (CARDIZEM) 90 MG tablet Take 90 mg by mouth 3 (three) times daily.       Marland Kitchen ezetimibe (ZETIA) 10 MG tablet Take 10 mg by mouth daily.        . Fluticasone-Salmeterol (ADVAIR) 250-50 MCG/DOSE AEPB Inhale 1 puff into the lungs daily.        . folic acid (FOLVITE) 449 MCG tablet Take 400 mcg by mouth daily.      . furosemide (LASIX) 40 MG tablet Take 40 mg by mouth 2 (two) times daily. Patient unsure of dosage- takes 2 tabs in the morning and 1 tab at night       . HYDROcodone-ibuprofen (VICOPROFEN) 7.5-200 MG per tablet Take 1 tablet by mouth every 6 (six) hours as needed. For pain       . ipratropium (ATROVENT) 0.02 % nebulizer solution Take 500 mcg by nebulization 4 (four) times daily as needed. For wheezing      . milk thistle 175 MG tablet Take 175 mg by mouth daily.      . Multiple Vitamin (MULITIVITAMIN WITH MINERALS) TABS Take 1 tablet by mouth daily.      Marland Kitchen omeprazole (PRILOSEC) 20 MG capsule Take 20 mg by mouth 2 (two) times daily.      . potassium chloride SA (K-DUR,KLOR-CON) 20 MEQ tablet Take 20 mEq by mouth daily.       . pregabalin (LYRICA) 150 MG capsule Take 150 mg by mouth 2 (two) times daily. One in am and two and night      . testosterone (ANDROGEL) 50 MG/5GM GEL Place 5 g onto the skin daily.        Marland Kitchen tiotropium (SPIRIVA) 18 MCG inhalation capsule Place 18 mcg into inhaler and inhale daily.        Marland Kitchen topiramate (TOPAMAX) 100 MG tablet Take 100 mg by mouth daily.        Marland Kitchen zolpidem (AMBIEN) 10 MG tablet Take 10 mg by mouth at bedtime as needed. For sleep       . Cyanocobalamin (VITAMIN B-12 PO) Take 1 tablet by mouth daily.      Marland Kitchen  ferrous sulfate 325 (65 FE) MG EC  tablet Take 325 mg by mouth daily.      . metFORMIN (GLUCOPHAGE) 500 MG tablet Take 500-1,000 mg by mouth 2 (two) times daily. Takes 1000 mg in the am and 500 mg at night       Past Medical History  Diagnosis Date  . Gout   . Diabetes mellitus     type 2  . RA (rheumatoid arthritis)   . Cardiac arrhythmia due to congenital heart disease   . COPD (chronic obstructive pulmonary disease)     with cpap  . Atrial fibrillation   . Cirrhosis   . Anxiety    Past Surgical History  Procedure Date  . Sciatic nerve exploration   . Hernia repair   . Tonsillectomy   . Esophagogastroduodenoscopy 02/28/2011    Procedure: ESOPHAGOGASTRODUODENOSCOPY (EGD);  Surgeon: Rogene Houston, MD;  Location: [facility];  Service: Endoscopy;  Laterality: N/A;  1200  . Colonoscopy 05/08/2011    Procedure: COLONOSCOPY;  Surgeon: Rogene Houston, MD;  Location: [facility];  Service: Endoscopy;  Laterality: N/A;  58   Family Status  Relation Status Death Age  . Mother Deceased     colon cancer-intestinal  . Father Deceased     lung and throat cancer  . Sister Deceased   . Brother Deceased     Brain cancer.   History   Social History  . Marital Status: Divorced    Spouse Name: N/A    Number of Children: N/A  . Years of Education: N/A   Occupational History  . Not on file.   Social History Main Topics  . Smoking status: Never Smoker   . Smokeless tobacco: Current User    Types: Snuff, Chew   Comment: Chews tobacco  . Alcohol Use: Yes     Has stopped drinking.  . Drug Use: No     Use to smoke cocaine and marijuana. No IV drug use  . Sexually Active: Not on file   Other Topics Concern  . Not on file   Social History Narrative  . No narrative on file   Allergies  Allergen Reactions  . Oxycodone Base Itching and Nausea And Vomiting  . Prozac (Fluoxetine Hcl) Itching        Objective:   Physical Exam Filed Vitals:   08/14/11 1551  Height: 6' (1.829 m)  Weight: 384 lb 6.4 oz  (174.363 kg)    Alert and oriented. Skin warm and dry. Oral mucosa is moist.   . Sclera anicteric, conjunctivae is pink. Thyroid not enlarged. No cervical lymphadenopathy. Lungs clear. Heart regular rate and rhythm.  Abdomen is soft. Bowel sounds are positive. No hepatomegaly. No abdominal masses felt. No tenderness. 3-4 + edema to lower extremities.        Assessment:   Alcoholic cirrhosis. Thrombocytopenia, Neutropenia.    Plan:    Cmet today and in 3 months. Keep appts with Dr. Sonny Dandy for thrombocytopenia and neutropenia. Have PCP to change Xanax to another drugs. Xanax is metabolized by the liver.

## 2011-08-15 LAB — COMPREHENSIVE METABOLIC PANEL
ALT: 14 U/L (ref 0–53)
AST: 25 U/L (ref 0–37)
Albumin: 4.1 g/dL (ref 3.5–5.2)
CO2: 26 mEq/L (ref 19–32)
Calcium: 9 mg/dL (ref 8.4–10.5)
Chloride: 107 mEq/L (ref 96–112)
Creat: 0.95 mg/dL (ref 0.50–1.35)
Potassium: 4.4 mEq/L (ref 3.5–5.3)

## 2011-08-15 LAB — PROTIME-INR: Prothrombin Time: 15.7 seconds — ABNORMAL HIGH (ref 11.6–15.2)

## 2011-08-16 ENCOUNTER — Ambulatory Visit: Payer: Medicare Other | Admitting: Adult Health

## 2011-08-21 ENCOUNTER — Encounter (INDEPENDENT_AMBULATORY_CARE_PROVIDER_SITE_OTHER): Payer: Self-pay

## 2011-08-24 ENCOUNTER — Encounter: Payer: Self-pay | Admitting: Adult Health

## 2011-08-24 ENCOUNTER — Ambulatory Visit (INDEPENDENT_AMBULATORY_CARE_PROVIDER_SITE_OTHER): Payer: Medicare Other | Admitting: Adult Health

## 2011-08-24 VITALS — BP 110/78 | HR 83 | Temp 97.5°F | Ht 72.0 in | Wt 378.2 lb

## 2011-08-24 DIAGNOSIS — J45909 Unspecified asthma, uncomplicated: Secondary | ICD-10-CM

## 2011-08-24 DIAGNOSIS — G4733 Obstructive sleep apnea (adult) (pediatric): Secondary | ICD-10-CM

## 2011-08-24 NOTE — Patient Instructions (Addendum)
Continue on CPAP At bedtime   Weight loss  Continue on Advair 1 puff Twice daily   Follow up Dr. Elsworth Soho  In 3-4 months and As needed

## 2011-08-28 DIAGNOSIS — J45909 Unspecified asthma, uncomplicated: Secondary | ICD-10-CM | POA: Insufficient documentation

## 2011-08-28 NOTE — Assessment & Plan Note (Signed)
Compensated on present regimen.   

## 2011-08-28 NOTE — Progress Notes (Signed)
  Subjective:    Patient ID: [name], male    DOB: Dec 03, 1965, 46 y.o.   MRN: [id number]  HPI 48 male  , morbidly obese for FU of severe OSA, on CPAP since 1/09.  PSG showed AHI 78/h when his wt was 385 lbs  March 29, 2009 1:56 PM  'arthritis' in both knesss, A month ago adm to [facility], no blood clots, fluid build up  August 29, 2009 4:12 PM  PFTs do not show obstruction, FEV1 81%, TLC 76%, DLCO 70% corrects for alveolar volume  download 1/5- 04/12/09 on auto 10-20 >> excellent compliance, avg pr 16, residual AHI 2.6/h  wt 400 lbs ! Unclear if advair/spiriva helping  February 14, 2010 9:56 AM  Here to requalify for O2 pulmonary emboli [facility], states compliant with CPAP, uses o2 daytime when he get winded & during sleep, has leak issues when he sleeps on his side or his stomach  Gained 35 lbs since PSG, hard for him to walk, considering bypass but has been unable to make it to class. Did not desaturate on walking   8/162013 Follow up  follow up to get recertified for O2  ONO needs to be ordered  . Reports  breathing / sleep are doing well.   no new complaints. Wears CPAP each night  Remains on Advair for asthma without flare in cough or wheezing     Review of Systems Constitutional:   No  weight loss, night sweats,  Fevers, chills, + fatigue, or  lassitude.  HEENT:   No headaches,  Difficulty swallowing,  Tooth/dental problems, or  Sore throat,                No sneezing, itching, ear ache, nasal congestion, post nasal drip,   CV:  No chest pain,  Orthopnea, PND, swelling in lower extremities, anasarca, dizziness, palpitations, syncope.   GI  No heartburn, indigestion, abdominal pain, nausea, vomiting, diarrhea, change in bowel habits, loss of appetite, bloody stools.   Resp:  o excess mucus, no productive cough,  No non-productive cough,  No coughing up of blood.  No change in color of mucus.  No wheezing.  No chest wall deformity  Skin: no rash or lesions.  GU: no  dysuria, change in color of urine, no urgency or frequency.  No flank pain, no hematuria   MS:  No joint pain or swelling.  No decreased range of motion.     Psych:  No change in mood or affect. No depression or anxiety.  No memory loss.         Objective:   Physical Exam GEN: A/Ox3; pleasant , NAD, obese   HEENT:  [place]/AT,  EACs-clear, TMs-wnl, NOSE-clear, THROAT-clear, no lesions, no postnasal drip or exudate noted.   NECK:  Supple w/ fair ROM; no JVD; normal carotid impulses w/o bruits; no thyromegaly or nodules palpated; no lymphadenopathy.  RESP  Clear  P & A; w/o, wheezes/ rales/ or rhonchi.no accessory muscle use, no dullness to percussion  CARD:  RRR, no m/r/g  ,1+  peripheral edema, pulses intact, no cyanosis or clubbing.  GI:   Soft & nt; nml bowel sounds; no organomegaly or masses detected.  Musco: Warm bil, no deformities or joint swelling noted.   Neuro: alert, no focal deficits noted.    Skin: Warm, no lesions or rashes         Assessment & Plan:

## 2011-08-28 NOTE — Assessment & Plan Note (Addendum)
Download requested  ONO for DME O2 qualification  Continue on CPAP At bedtime   Weight loss  Continue on Advair 1 puff Twice daily   Follow up Dr. Elsworth Soho  In 3-4 months and As needed

## 2011-08-29 ENCOUNTER — Ambulatory Visit: Payer: Medicare Other | Attending: Family Medicine | Admitting: Physical Therapy

## 2011-08-29 DIAGNOSIS — R5381 Other malaise: Secondary | ICD-10-CM | POA: Insufficient documentation

## 2011-08-29 DIAGNOSIS — M6281 Muscle weakness (generalized): Secondary | ICD-10-CM | POA: Insufficient documentation

## 2011-08-29 DIAGNOSIS — IMO0001 Reserved for inherently not codable concepts without codable children: Secondary | ICD-10-CM | POA: Insufficient documentation

## 2011-08-29 DIAGNOSIS — M545 Low back pain, unspecified: Secondary | ICD-10-CM | POA: Insufficient documentation

## 2011-08-30 ENCOUNTER — Ambulatory Visit: Payer: Medicare Other | Admitting: *Deleted

## 2011-09-04 ENCOUNTER — Telehealth: Payer: Self-pay | Admitting: Pulmonary Disease

## 2011-09-04 ENCOUNTER — Ambulatory Visit: Payer: Medicare Other | Admitting: *Deleted

## 2011-09-04 NOTE — Telephone Encounter (Signed)
Download 8/13 reviewed >> I am pleased that he is using his cpap well No residual events avg pr 14.6 cm

## 2011-09-04 NOTE — Telephone Encounter (Signed)
I spoke with patient about results and he verbalized understanding and had no questions 

## 2011-09-07 ENCOUNTER — Encounter: Payer: Medicare Other | Admitting: Hematology and Oncology

## 2011-09-07 DIAGNOSIS — D6959 Other secondary thrombocytopenia: Secondary | ICD-10-CM

## 2011-09-07 DIAGNOSIS — R609 Edema, unspecified: Secondary | ICD-10-CM

## 2011-09-07 DIAGNOSIS — K7460 Unspecified cirrhosis of liver: Secondary | ICD-10-CM

## 2011-09-07 DIAGNOSIS — R161 Splenomegaly, not elsewhere classified: Secondary | ICD-10-CM

## 2011-09-19 ENCOUNTER — Telehealth: Payer: Self-pay | Admitting: Pulmonary Disease

## 2011-09-19 NOTE — Telephone Encounter (Signed)
ONO 08/28/11 >>No significant desatn on CPAP/RA  Does not need oxygen in my opinion

## 2011-09-20 NOTE — Telephone Encounter (Signed)
His oxygen level is due to OSA & corrected by CPAP. I cannot sign his CMN

## 2011-09-20 NOTE — Telephone Encounter (Signed)
Pt returned [name]'s call.  [name]

## 2011-09-20 NOTE — Telephone Encounter (Signed)
I spoke with pt and made him aware of results. He voiced his understanding. He stated last time he did this Dr. Elsworth Soho made the same conclusion. Then a "3rd party" called him and did same test and told him he did need oxygen at night. He stated he is going to try and find out who did the same testing last year and told him he did need oxygen and call them bc he wants them to re test him as well. Pt could not remember who this was. Will forward to Dr. Elsworth Soho so he is aware of this.

## 2011-09-20 NOTE — Telephone Encounter (Signed)
lmomtcb x1 for pt 

## 2011-09-20 NOTE — Telephone Encounter (Signed)
I spoke with pt and made him aware of this. He still believes he needs oxygen at night and stated he will discuss this with [facility]. He stated even last year when he was on cpap he had ONO and was told he did need oxygen. RA is aware of this and will not sign CMN for the oxygen. Will sign off message

## 2011-10-02 ENCOUNTER — Encounter: Payer: Self-pay | Admitting: Adult Health

## 2011-11-14 ENCOUNTER — Ambulatory Visit (INDEPENDENT_AMBULATORY_CARE_PROVIDER_SITE_OTHER): Payer: Medicare Other | Admitting: Internal Medicine

## 2011-11-14 ENCOUNTER — Encounter (INDEPENDENT_AMBULATORY_CARE_PROVIDER_SITE_OTHER): Payer: Self-pay | Admitting: Internal Medicine

## 2011-11-14 VITALS — BP 120/84 | HR 60 | Temp 98.3°F | Ht 72.0 in | Wt 371.5 lb

## 2011-11-14 DIAGNOSIS — K7460 Unspecified cirrhosis of liver: Secondary | ICD-10-CM

## 2011-11-14 DIAGNOSIS — D696 Thrombocytopenia, unspecified: Secondary | ICD-10-CM

## 2011-11-14 LAB — COMPREHENSIVE METABOLIC PANEL
AST: 35 U/L (ref 0–37)
Alkaline Phosphatase: 61 U/L (ref 39–117)
BUN: 15 mg/dL (ref 6–23)
Creat: 1.1 mg/dL (ref 0.50–1.35)
Glucose, Bld: 65 mg/dL — ABNORMAL LOW (ref 70–99)
Potassium: 4.1 mEq/L (ref 3.5–5.3)
Total Bilirubin: 0.9 mg/dL (ref 0.3–1.2)

## 2011-11-14 NOTE — Progress Notes (Signed)
Subjective:     Patient ID: [name], male   DOB: 01/25/1965, 46 y.o.   MRN: [id number]  HPIHere today for f/u of his alcoholic liver disease with hx of elevated transaminases. He tells me he is doing good.  He has lost 13 pounds since his last visit in August.  He says he feels good.  He tells me he is working part time now.   His weight loss was intentional. Appetite is good. No abdominal pain. He usual has a BM once every other day.  He does take a laxative every other day. No melena or bright red rectal bleeding. CMP     Component Value Date/Time   NA 141 08/15/2011 0931   K 4.4 08/15/2011 0931   CL 107 08/15/2011 0931   CO2 26 08/15/2011 0931   GLUCOSE 101* 08/15/2011 0931   BUN 16 08/15/2011 0931   CREATININE 0.95 08/15/2011 0931   CREATININE 0.8 05/05/2007 0828   CALCIUM 9.0 08/15/2011 0931   PROT 7.0 08/15/2011 0931   ALBUMIN 4.1 08/15/2011 0931   AST 25 08/15/2011 0931   ALT 14 08/15/2011 0931   ALKPHOS 60 08/15/2011 0931   BILITOT 0.8 08/15/2011 0931   GFRNONAA 112 05/05/2007 0828   GFRAA 136 05/05/2007 0828     05/08/2011 Colonoscopy: rectal bleeding. Dr. Rehman:Impression:  Examination performed to cecum.  Two small polyps ablated via cold biopsy and submitted together(ascending and transverse colon).  6 mm and 10 mm polyp snared from proximal sigmoid colon and submitted together. One Hemoclip applied to each site to reduce the risk of post-polypectomy bleed given he has thrombocytopenia and portal hypertension.  Mild sigmoid colon diverticulosis.  Internal hemorrhoids  Biopsy: Tubular adenomas x 3. Next colonoscopy in 6 yrs. EGD 2/013: searching for esophageal varices:Impression:  Three columns of grade 1 esophageal varices.  Mild portal gastropathy.  Cluster of prepyloric/antral nodules with eroded surface. 3 of these were biopsied for routine histology.  Liver Biopsy 01/05/2012: Hepatic cirrhosis. Trichrome stain demonstrates diffuse bridging fibrosis with nodular formation. PAS stain  negative for Alpha 1 antitrypsin deposition. Iron stain is negative. No hepatocyte dysplasia is present.     Hepatis A,B,C markers were negative.  Ceruplasmin, SMA, ANA were normal.  .12/2010: Platelet low at 65.     Review of Systems see hpi Current Outpatient Prescriptions  Medication Sig Dispense Refill  . albuterol (PROVENTIL HFA;VENTOLIN HFA) 108 (90 BASE) MCG/ACT inhaler Inhale 2 puffs into the lungs every 6 (six) hours as needed. For shortness of breath        . diltiazem (CARDIZEM) 90 MG tablet Take 90 mg by mouth 3 (three) times daily.       Marland Kitchen ezetimibe (ZETIA) 10 MG tablet Take 10 mg by mouth daily.        . fluticasone (FLONASE) 50 MCG/ACT nasal spray Place 2 sprays into both nostrils daily as needed.      . Fluticasone-Salmeterol (ADVAIR) 250-50 MCG/DOSE AEPB Inhale 1 puff into the lungs daily.        . folic acid (FOLVITE) 528 MCG tablet Take 400 mcg by mouth daily.      . furosemide (LASIX) 40 MG tablet Take 40 mg by mouth 2 (two) times daily. Patient unsure of dosage- takes 2 tabs in the morning and 1 tab at night       . HYDROcodone-ibuprofen (VICOPROFEN) 7.5-200 MG per tablet Take 1 tablet by mouth every 6 (six) hours as needed. For pain       .  ipratropium (ATROVENT) 0.02 % nebulizer solution Take 500 mcg by nebulization 4 (four) times daily as needed. For wheezing      . lidocaine (XYLOCAINE) 5 % ointment Apply as needed      . milk thistle 175 MG tablet Take 175 mg by mouth daily.      . Multiple Vitamin (MULITIVITAMIN WITH MINERALS) TABS Take 1 tablet by mouth daily.      Marland Kitchen omeprazole (PRILOSEC) 20 MG capsule Take 20 mg by mouth 2 (two) times daily.      . potassium chloride (K-DUR) 10 MEQ tablet Take 1 tablet by mouth daily.      . pregabalin (LYRICA) 150 MG capsule Take 150 mg by mouth 2 (two) times daily. One in am and two and night      . testosterone (ANDROGEL) 50 MG/5GM GEL Place 5 g onto the skin daily.        Marland Kitchen topiramate (TOPAMAX) 100 MG tablet Take 100 mg  by mouth daily.        Marland Kitchen torsemide (DEMADEX) 20 MG tablet 2 tabs in the morning and 1 tab 6 hours later      . zolpidem (AMBIEN) 10 MG tablet Take 10 mg by mouth at bedtime as needed. For sleep        Past Medical History  Diagnosis Date  . Gout   . Diabetes mellitus     type 2  . RA (rheumatoid arthritis)   . Cardiac arrhythmia due to congenital heart disease   . COPD (chronic obstructive pulmonary disease)     with cpap  . Atrial fibrillation   . Cirrhosis   . Anxiety    Past Surgical History  Procedure Date  . Sciatic nerve exploration   . Hernia repair   . Tonsillectomy   . Esophagogastroduodenoscopy 02/28/2011    Procedure: ESOPHAGOGASTRODUODENOSCOPY (EGD);  Surgeon: Rogene Houston, MD;  Location: [facility];  Service: Endoscopy;  Laterality: N/A;  1200  . Colonoscopy 05/08/2011    Procedure: COLONOSCOPY;  Surgeon: Rogene Houston, MD;  Location: [facility];  Service: Endoscopy;  Laterality: N/A;  86   Family Status  Relation Status Death Age  . Mother Deceased     colon cancer-intestinal  . Father Deceased     lung and throat cancer  . Sister Deceased   . Brother Deceased     Brain cancer.   History   Social History  . Marital Status: Divorced    Spouse Name: N/A    Number of Children: N/A  . Years of Education: N/A   Occupational History  . Not on file.   Social History Main Topics  . Smoking status: Never Smoker   . Smokeless tobacco: Current User    Types: Snuff, Chew     Comment: Chews tobacco  . Alcohol Use: Yes     Comment: Has stopped drinking.  . Drug Use: No     Comment: Use to smoke cocaine and marijuana. No IV drug use  . Sexually Active: Not on file   Other Topics Concern  . Not on file   Social History Narrative  . No narrative on file   Allergies  Allergen Reactions  . Oxycodone Base Itching and Nausea And Vomiting  . Prozac (Fluoxetine Hcl) Itching       Objective:   Physical Exam  Filed Vitals:   11/14/11 1349    Height: 6' (1.829 m)  Weight: 371 lb 8 oz (168.511 kg)  Alert and oriented. Skin warm and dry. Oral mucosa is moist.   . Sclera anicteric, conjunctivae is pink. Thyroid not enlarged. No cervical lymphadenopathy. Lungs clear. Heart regular rate and rhythm.  Abdomen is soft. Bowel sounds are positive. No hepatomegaly. No abdominal masses felt. No tenderness.  N2-3+ edema to lower extremities.       Assessment:    Alcoholic liver disease. He seems to be doing well.  Liver enzymes are normal. He is trying to exercise. He has lost approximately 13 pounds since his last visit.    Plan:     Continue to diet and exercise. CMet today. PTINR, CBC  today. OV 4 months with Dr. Laural Golden..  Needs to loose 10 pound before next office visit.

## 2011-11-14 NOTE — Patient Instructions (Addendum)
OV in 4 months with Dr. Laural Golden

## 2011-11-15 LAB — CBC WITH DIFFERENTIAL/PLATELET
Basophils Absolute: 0 10*3/uL (ref 0.0–0.1)
Eosinophils Relative: 6 % — ABNORMAL HIGH (ref 0–5)
HCT: 39.7 % (ref 39.0–52.0)
Hemoglobin: 14.2 g/dL (ref 13.0–17.0)
Lymphocytes Relative: 27 % (ref 12–46)
Lymphs Abs: 1.1 10*3/uL (ref 0.7–4.0)
MCV: 81.2 fL (ref 78.0–100.0)
Monocytes Absolute: 0.4 10*3/uL (ref 0.1–1.0)
Neutro Abs: 2.2 10*3/uL (ref 1.7–7.7)
RBC: 4.89 MIL/uL (ref 4.22–5.81)
RDW: 15.6 % — ABNORMAL HIGH (ref 11.5–15.5)
WBC: 4 10*3/uL (ref 4.0–10.5)

## 2011-11-15 LAB — PROTIME-INR: INR: 1.15 (ref <1.50)

## 2011-11-15 NOTE — Addendum Note (Signed)
Addended by: Butch Penny on: 11/15/2011 04:34 PM   Modules accepted: Orders

## 2011-12-03 ENCOUNTER — Ambulatory Visit: Payer: Medicare Other | Admitting: Pulmonary Disease

## 2011-12-26 ENCOUNTER — Ambulatory Visit (INDEPENDENT_AMBULATORY_CARE_PROVIDER_SITE_OTHER): Payer: Medicare Other | Admitting: Surgery

## 2011-12-26 ENCOUNTER — Encounter (INDEPENDENT_AMBULATORY_CARE_PROVIDER_SITE_OTHER): Payer: Self-pay | Admitting: Surgery

## 2011-12-26 VITALS — BP 148/82 | HR 76 | Temp 97.8°F | Resp 18 | Ht 72.0 in | Wt 386.1 lb

## 2011-12-26 DIAGNOSIS — K429 Umbilical hernia without obstruction or gangrene: Secondary | ICD-10-CM

## 2011-12-26 NOTE — Patient Instructions (Signed)
[facility]  HERNIA REPAIR POST OP INSTRUCTIONS  Always review your discharge instruction sheet given to you by the facility where your surgery was performed.  1. A  prescription for pain medication may be given to you upon discharge.  Take your pain medication as prescribed.  If narcotic pain medicine is not needed, then you may take acetaminophen (Tylenol) or ibuprofen (Advil) as needed.  2. Take your usually prescribed medications unless otherwise directed.  3. If you need a refill on your pain medication, please contact your pharmacy.  They will contact our office to request authorization. Prescriptions will not be filled after 5 pm daily or on weekends.  4. You should follow a light diet the first 24 hours after arrival home, such as soup and crackers or toast.  Be sure to include plenty of fluids daily.  Resume your normal diet the day after surgery.  5. Most patients will experience some swelling and bruising around the surgical site.  Ice packs and reclining will help.  Swelling and bruising can take several days to resolve.   6. It is common to experience some constipation if taking pain medication after surgery.  Increasing fluid intake and taking a stool softener (such as Colace) will usually help or prevent this problem from occurring.  A mild laxative (Milk of Magnesia or Miralax) should be taken according to package directions if there are no bowel movements after 48 hours.  7. Unless discharge instructions indicate otherwise, you may remove your bandages 24-48 hours after surgery, and you may shower at that time.  You may have steri-strips (small skin tapes) in place directly over the incision.  These strips should be left on the skin for 7-10 days.  If your surgeon used skin glue on the incision, you may shower in 24 hours.  The glue will flake off over the next 2-3 weeks.  Any sutures or staples will be removed at the office during your follow-up  visit.  8. ACTIVITIES:  You may resume regular (light) daily activities beginning the next day-such as daily self-care, walking, climbing stairs-gradually increasing activities as tolerated.  You may have sexual intercourse when it is comfortable.  Refrain from any heavy lifting or straining until approved by your doctor.  You may drive when you are no longer taking prescription pain medication, you can comfortably wear a seatbelt, and you can safely maneuver your car and apply brakes.  9. You should see your doctor in the office for a follow-up appointment approximately 2-3 weeks after your surgery.  Make sure that you call for this appointment within a day or two after you arrive home to insure a convenient appointment time. 10.   WHEN TO CALL YOUR DOCTOR: 1. Fever greater than 101.0 2. Inability to urinate 3. Persistent nausea and/or vomiting 4. Extreme swelling or bruising 5. Continued bleeding from incision 6. Increased pain, redness, or drainage from the incision  The clinic staff is available to answer your questions during regular business hours.  Please don't hesitate to call and ask to speak to one of the nurses for clinical concerns.  If you have a medical emergency, go to the nearest emergency room or call 911.  A surgeon from [facility] is always on call for the hospital.   [facility] [address] [place], [place], [place]  [address]  [phone] ? [phone] ? FAX [phone]  [email or web address]

## 2011-12-26 NOTE — Progress Notes (Signed)
General Surgery [facility]  Chief Complaint  Patient presents with  . New Evaluation    umbilical hernia - referral from Worthy Keeler, PA, at [facility]: Patient is a 46 year old white male referred by his primary care provider with symptomatic umbilical hernia. Hernia has been present for approximately one year. It has gradually increased in size. It is causing him significant discomfort. It has always remained reducible. Patient does note some increasing constipation. He has had no prior abdominal surgery other than the repair of a right inguinal hernia as an infant.  Past Medical History  Diagnosis Date  . Gout   . Diabetes mellitus     type 2  . RA (rheumatoid arthritis)   . Cardiac arrhythmia due to congenital heart disease   . COPD (chronic obstructive pulmonary disease)     with cpap  . Atrial fibrillation   . Cirrhosis   . Anxiety   . Heart murmur   . CHF (congestive heart failure)   . Neuromuscular disorder   . Constipation      Current Outpatient Prescriptions  Medication Sig Dispense Refill  . albuterol (PROVENTIL HFA;VENTOLIN HFA) 108 (90 BASE) MCG/ACT inhaler Inhale 2 puffs into the lungs every 6 (six) hours as needed. For shortness of breath        . ALPRAZolam (XANAX) 0.5 MG tablet Three times a day.      Marland Kitchen COLCRYS 0.6 MG tablet as needed.       . diltiazem (CARDIZEM) 90 MG tablet Take 90 mg by mouth 3 (three) times daily.       Marland Kitchen ezetimibe (ZETIA) 10 MG tablet Take 10 mg by mouth daily.        . fluticasone (FLONASE) 50 MCG/ACT nasal spray Place 2 sprays into both nostrils daily as needed.      . Fluticasone-Salmeterol (ADVAIR) 250-50 MCG/DOSE AEPB Inhale 1 puff into the lungs daily.        . folic acid (FOLVITE) 270 MCG tablet Take 400 mcg by mouth daily.      Marland Kitchen HYDROcodone-ibuprofen (VICOPROFEN) 7.5-200 MG per tablet Take 1 tablet by mouth every 6 (six) hours as needed. For pain       . ipratropium  (ATROVENT) 0.02 % nebulizer solution Take 500 mcg by nebulization 4 (four) times daily as needed. For wheezing      . lidocaine (XYLOCAINE) 5 % ointment Apply as needed      . metFORMIN (GLUCOPHAGE) 500 MG tablet 500 mg as needed.       . milk thistle 175 MG tablet Take 175 mg by mouth 4 (four) times daily.       . Multiple Vitamin (MULITIVITAMIN WITH MINERALS) TABS Take 1 tablet by mouth daily.      Marland Kitchen NEXIUM 40 MG capsule Daily.      . potassium chloride (K-DUR) 10 MEQ tablet Take 1 tablet by mouth daily.      . pregabalin (LYRICA) 150 MG capsule Take 150 mg by mouth 2 (two) times daily. One in am and two and night      . testosterone (ANDROGEL) 50 MG/5GM GEL Place 5 g onto the skin daily.        Marland Kitchen topiramate (TOPAMAX) 100 MG tablet Take 100 mg by mouth daily.        Marland Kitchen torsemide (DEMADEX) 20 MG tablet 2 tabs in the morning and 1 tab 6 hours later      .  zolpidem (AMBIEN) 10 MG tablet Take 10 mg by mouth at bedtime as needed. For sleep          Allergies  Allergen Reactions  . Oxycodone Base Itching and Nausea And Vomiting  . Prozac (Fluoxetine Hcl) Itching     Family History  Problem Relation Age of Onset  . Colon cancer Mother   . Cancer Mother     intestine  . Cancer Father     throat  . Cancer Brother     brain     History   Social History  . Marital Status: Divorced    Spouse Name: N/A    Number of Children: N/A  . Years of Education: N/A   Social History Main Topics  . Smoking status: Never Smoker   . Smokeless tobacco: Current User    Types: Snuff, Chew     Comment: Chews tobacco  . Alcohol Use: Yes     Comment: very rarely  . Drug Use: No     Comment: Use to smoke cocaine and marijuana. No IV drug use  . Sexually Active: None   Other Topics Concern  . None   Social History Narrative  . None     REVIEW OF SYSTEMS - PERTINENT POSITIVES ONLY: Constipation. Mild increase in abdominal girth. Mild intermittent pain at umbilicus.  EXAM: Filed Vitals:    12/26/11 0954  BP: 148/82  Pulse: 76  Temp: 97.8 F (36.6 C)  Resp: 18    HEENT: normocephalic; pupils equal and reactive; sclerae clear; dentition fair; mucous membranes moist NECK:  symmetric on extension; no palpable anterior or posterior cervical lymphadenopathy; no supraclavicular masses; no tenderness CHEST: clear to auscultation bilaterally without rales, rhonchi, or wheezes CARDIAC: regular rate and rhythm without significant murmur; peripheral pulses are full ABDOMEN: Morbidly obese; soft without distension; bowel sounds present; no mass; no hepatosplenomegaly; visible umbilical hernia with slight discoloration of the overlying skin, reducible, FACS mentally 2.5 cm fascial defect; small to moderate rectus diastasis EXT:  non-tender without edema; no deformity NEURO: no gross focal deficits; no sign of tremor   LABORATORY RESULTS: See [facility] (CHL-Epic) for most recent results   RADIOLOGY RESULTS: See [facility] (CHL-Epic) for most recent results   IMPRESSION: #1 reducible umbilical hernia, symptomatic #2 morbid obesity #3 history of alcoholic liver disease  PLAN: Patient describes an enlarging umbilical hernia which is becoming increasingly symptomatic. He desires operative repair. We have discussed the procedure. I have recommended open repair with mesh patch. I have quoted him approximately a 5-10% incidence of recurrence. Given his morbid obesity and other health problems he is at risk for wound complications and nonhealing of his surgical incision. He understands. Patient would like to proceed with surgery in the near future. We discussed restrictions on his activities following the procedure. He understands.  The risks and benefits of the procedure have been discussed at length with the patient.  The patient understands the proposed procedure, potential alternative treatments, and the course of recovery to be expected.  All of the patient's questions  have been answered at this time.  The patient wishes to proceed with surgery.  Earnstine Regal, MD, [facility]   Visit Diagnoses: 1. Umbilical hernia, reducible     Primary Care Physician: Gearlean Alf., PA

## 2012-01-14 ENCOUNTER — Encounter (HOSPITAL_COMMUNITY)
Admission: RE | Admit: 2012-01-14 | Discharge: 2012-01-14 | Disposition: A | Discharge status: Home / Self Care | Payer: Medicare Other | Source: Ambulatory Visit | Attending: Surgery | Admitting: Surgery

## 2012-01-14 ENCOUNTER — Encounter (HOSPITAL_COMMUNITY): Payer: Self-pay | Admitting: Pharmacy Technician

## 2012-01-14 ENCOUNTER — Telehealth (INDEPENDENT_AMBULATORY_CARE_PROVIDER_SITE_OTHER): Payer: Self-pay

## 2012-01-14 ENCOUNTER — Encounter (HOSPITAL_COMMUNITY): Payer: Self-pay

## 2012-01-14 ENCOUNTER — Encounter: Payer: Self-pay | Admitting: Pulmonary Disease

## 2012-01-14 ENCOUNTER — Ambulatory Visit (INDEPENDENT_AMBULATORY_CARE_PROVIDER_SITE_OTHER): Payer: Medicare Other | Admitting: Pulmonary Disease

## 2012-01-14 ENCOUNTER — Ambulatory Visit (HOSPITAL_COMMUNITY)
Admission: RE | Admit: 2012-01-14 | Discharge: 2012-01-14 | Disposition: A | Discharge status: Home / Self Care | Payer: Medicare Other | Source: Ambulatory Visit | Attending: Surgery | Admitting: Surgery

## 2012-01-14 VITALS — BP 112/78 | HR 66 | Temp 97.1°F | Ht 72.0 in | Wt 399.0 lb

## 2012-01-14 DIAGNOSIS — G4733 Obstructive sleep apnea (adult) (pediatric): Secondary | ICD-10-CM

## 2012-01-14 DIAGNOSIS — J45909 Unspecified asthma, uncomplicated: Secondary | ICD-10-CM

## 2012-01-14 DIAGNOSIS — K429 Umbilical hernia without obstruction or gangrene: Secondary | ICD-10-CM | POA: Insufficient documentation

## 2012-01-14 DIAGNOSIS — Z01812 Encounter for preprocedural laboratory examination: Secondary | ICD-10-CM | POA: Insufficient documentation

## 2012-01-14 DIAGNOSIS — Z01818 Encounter for other preprocedural examination: Secondary | ICD-10-CM | POA: Insufficient documentation

## 2012-01-14 HISTORY — DX: Sleep apnea, unspecified: G47.30

## 2012-01-14 LAB — CBC
MCH: 29.2 pg (ref 26.0–34.0)
MCHC: 34.7 g/dL (ref 30.0–36.0)
MCV: 84 fL (ref 78.0–100.0)
Platelets: 34 10*3/uL — ABNORMAL LOW (ref 150–400)

## 2012-01-14 LAB — BASIC METABOLIC PANEL
Calcium: 9.2 mg/dL (ref 8.4–10.5)
Creatinine, Ser: 0.78 mg/dL (ref 0.50–1.35)
GFR calc non Af Amer: >90 mL/min (ref >90)
Glucose, Bld: 103 mg/dL — ABNORMAL HIGH (ref 70–99)
Sodium: 139 mEq/L (ref 135–145)

## 2012-01-14 LAB — SURGICAL PCR SCREEN
MRSA, PCR: NEGATIVE
Staphylococcus aureus: NEGATIVE

## 2012-01-14 NOTE — Assessment & Plan Note (Signed)
Stay on cpap 15 cm Weight loss encouraged, compliance with goal of at least 4-6 hrs every night is the expectation. Advised against medications with sedative side effects Cautioned against driving when sleepy - understanding that sleepiness will vary on a day to day basis

## 2012-01-14 NOTE — Assessment & Plan Note (Signed)
OK to stop advair Your breathing problems are mostly related to your weight Increase  Toresemide to 2 tabs twice daily x 5ds

## 2012-01-14 NOTE — Pre-Procedure Instructions (Addendum)
01-14-12 EKG 07-28-11. CXR done today. 01-14-12 1625 labs faxed to Dr. Harlow Asa in basket. W. Floy Sabina

## 2012-01-14 NOTE — Progress Notes (Signed)
  Subjective:    Patient ID: [name], male    DOB: 02-11-1965, 47 y.o.   MRN: [id number]  HPI PCP- Moss Mc  58 male , morbidly obese for FU of severe OSA, on CPAP since 1/09.  PSG showed AHI 78/h when his wt was 385 lbs   PFTs '11 do not show obstruction, FEV1 81%, TLC 76%, DLCO 70% corrects for alveolar volume  download 1/5- 04/12/09 on auto 10-20 >> excellent compliance, avg pr 16, residual AHI 2.6/h  wt 400 lbs ! Unclear if advair/spiriva helping    01/14/2012 31mFU Download 8/13 reviewed >> good compliance ,No residual events ,avg pr 14.6 cm ONO 08/28/11 >>No significant desatn on CPAP/RA  Does not need oxygen in my opinion Gained 14lbs   wears cpap everynight x 6-10 hrs a night. feels like mask is too small. does not like full face mask.  breathing is been fair. denies ay cough, wheezing, no chest tx Stopped spiriva- unsure if advair helps Remains on Advair for asthma without flare in cough or wheezing  On prior visits, no destauration noted on ambulation or on ONO Spirometry >> no airway obstruction or restriction No desaturation on walking today  Review of Systems neg for any significant sore throat, dysphagia, itching, sneezing, nasal congestion or excess/ purulent secretions, fever, chills, sweats, unintended wt loss, pleuritic or exertional cp, hempoptysis, orthopnea pnd or change in chronic leg swelling. Also denies presyncope, palpitations, heartburn, abdominal pain, nausea, vomiting, diarrhea or change in bowel or urinary habits, dysuria,hematuria, rash, arthralgias, visual complaints, headache, numbness weakness or ataxia.     Objective:   Physical Exam  Gen. Pleasant, obese, in no distress ENT - no lesions, no post nasal drip Neck: No JVD, no thyromegaly, no carotid bruits Lungs: no use of accessory muscles, no dullness to percussion, decreased without rales or rhonchi  Cardiovascular: Rhythm regular, heart sounds  normal, no murmurs or gallops, no  peripheral edema Musculoskeletal: No deformities, no cyanosis or clubbing , no tremors        Assessment & Plan:

## 2012-01-14 NOTE — Telephone Encounter (Signed)
Pt called today to report he "thinks" he may have a right inguinal hernia in addition to his umbilical hernia.  When he coughs or sneezes he feels as if something is "moving down there".  He is scheduled for surgery next week, and would like to know if he could be checked prior to the surgery.

## 2012-01-14 NOTE — Patient Instructions (Addendum)
[place]  01/14/2012   Your procedure is scheduled on:   01-24-2012  Report to [facility] at     [place]   AM ..  Call this number if you have problems the morning of surgery: [phone]  Or Presurgical Testing [phone](Tevin Shillingford)   Remember: Follow any bowel prep instructions per MD office. For Cpap use: Bring mask and tubing only.   Do not eat food:After Midnight.    Take these medicines the morning of surgery with A SIP OF WATER: Diltiazem. Lyrica. Hydrocodone. Topamax. Bring  Albuterol, Advair, Flonase- use as usual.   Do not wear jewelry, make-up or nail polish.  Do not wear lotions, powders, or perfumes. You may wear deodorant.  Do not shave 48 hours prior to surgery.(face and neck okay, no shaving of legs)  Do not bring valuables to the hospital.  Contacts, dentures or bridgework,body piercing,  may not be worn into surgery.  Leave suitcase in the car. After surgery it may be brought to your room.  For patients admitted to the hospital, checkout time is 11:00 AM the day of discharge.   Patients discharged the day of surgery will not be allowed to drive home. Must have responsible person with you x 24 hours once discharged.  Name and phone number of your driver: Valentina Lucks, roommate [phone] cell  Special Instructions: CHG Shower Use Special Wash: see special instructions.(avoid face and genitals)   Please read over the following fact sheets that you were given: MRSA Information.    Failure to follow these instructions may result in Cancellation of your surgery.   Patient signature_______________________________________________________

## 2012-01-14 NOTE — Patient Instructions (Addendum)
Breathing test showed good lung function - Oxygen did not drop OK to stop advair Your breathing problems are mostly related to your weight Increase  Toresemide to 2 tabs twice daily x 5ds Stay on cpap 15 cm

## 2012-01-15 ENCOUNTER — Encounter (INDEPENDENT_AMBULATORY_CARE_PROVIDER_SITE_OTHER): Payer: Medicare Other | Admitting: Surgery

## 2012-01-15 ENCOUNTER — Telehealth (INDEPENDENT_AMBULATORY_CARE_PROVIDER_SITE_OTHER): Payer: Self-pay

## 2012-01-15 ENCOUNTER — Telehealth (INDEPENDENT_AMBULATORY_CARE_PROVIDER_SITE_OTHER): Payer: Self-pay | Admitting: Internal Medicine

## 2012-01-15 ENCOUNTER — Telehealth (INDEPENDENT_AMBULATORY_CARE_PROVIDER_SITE_OTHER): Payer: Self-pay | Admitting: *Deleted

## 2012-01-15 ENCOUNTER — Telehealth (INDEPENDENT_AMBULATORY_CARE_PROVIDER_SITE_OTHER): Payer: Self-pay | Admitting: Surgery

## 2012-01-15 DIAGNOSIS — K7460 Unspecified cirrhosis of liver: Secondary | ICD-10-CM

## 2012-01-15 DIAGNOSIS — D649 Anemia, unspecified: Secondary | ICD-10-CM

## 2012-01-15 DIAGNOSIS — K429 Umbilical hernia without obstruction or gangrene: Secondary | ICD-10-CM

## 2012-01-15 NOTE — Telephone Encounter (Signed)
Pt called stating he will not be able to keep appt today. I advised pt of his low platelet count of 34 and that Dr Harlow Asa was going to discuss this with him and the need to delay surgery until pt has this evaluated and treated. Pt is seeing his PCP today and will call me back with his fax #.

## 2012-01-15 NOTE — Telephone Encounter (Signed)
Patient to have lab in 2 weeks per Lelon Perla The lab is noted for 01-29-12

## 2012-01-15 NOTE — Telephone Encounter (Signed)
Patient needs office visit.

## 2012-01-15 NOTE — Telephone Encounter (Signed)
Patient was seen in consultation for symptomatic umbilical hernia. Patient has multiple significant medical problems including morbid obesity and hepatocellular disease. Preoperative workup revealed thrombocytopenia and leukopenia, likely related to his underlying cirrhosis. Patient has contacted the office and believes he may also have developed and inguinal hernia. Patient was offered a followup examination in the office which she has been unable to attend.  We will cancel his scheduled surgical procedure. I have asked him to return to his primary care physician and to be evaluated by his gastroenterologist in the near future. At this point he is not an acceptable medical candidate for hernia repair. I plan to see him in the office in the near future and reexamine his abdomen and genitalia for possible inguinal hernia.  Patient will require optimization of his hepatic function. We will need to address his thrombocytopenia. Certainly significant weight loss would aid in his surgical care. Given his co-morbidities, this patient may need to be treated at a tertiary care center. We will make that decision at his next office visit at [facility].  Earnstine Regal, MD, [facility] Office: [phone]

## 2012-01-15 NOTE — Telephone Encounter (Signed)
[name], needs a CBC in 2 weeks.

## 2012-01-15 NOTE — Telephone Encounter (Signed)
Lab is noted for 01-29-12. The order has been faxed to [facility] lab

## 2012-01-15 NOTE — Telephone Encounter (Signed)
Pt called back with fax # to pcp. Labs and records faxed to Dr Duane Boston and Dr Laural Golden with note of need for OV asap with Dr Laural Golden. Pt is advised surgery is cx and the need for appt with Dr Laural Golden asap. Pt will f/u with Dr Harlow Asa once his work up is complete and his Platelet count is stabelized.  Pt states he understands.

## 2012-01-17 NOTE — Telephone Encounter (Signed)
Apt has been scheduled for 01/28/12 with Dr. Laural Golden.

## 2012-01-24 ENCOUNTER — Encounter (HOSPITAL_COMMUNITY): Admission: RE | Payer: Self-pay | Source: Ambulatory Visit

## 2012-01-24 ENCOUNTER — Ambulatory Visit (HOSPITAL_COMMUNITY): Admission: RE | Admit: 2012-01-24 | Payer: Medicare Other | Source: Ambulatory Visit | Admitting: Surgery

## 2012-01-24 SURGERY — REPAIR, HERNIA, UMBILICAL, ADULT
Anesthesia: General

## 2012-01-28 ENCOUNTER — Encounter (INDEPENDENT_AMBULATORY_CARE_PROVIDER_SITE_OTHER): Payer: Self-pay | Admitting: Internal Medicine

## 2012-01-28 ENCOUNTER — Ambulatory Visit (INDEPENDENT_AMBULATORY_CARE_PROVIDER_SITE_OTHER): Payer: Medicare Other | Admitting: Internal Medicine

## 2012-01-28 VITALS — BP 130/78 | HR 80 | Temp 98.0°F | Resp 20 | Ht 72.0 in | Wt 387.7 lb

## 2012-01-28 DIAGNOSIS — D696 Thrombocytopenia, unspecified: Secondary | ICD-10-CM

## 2012-01-28 DIAGNOSIS — K7460 Unspecified cirrhosis of liver: Secondary | ICD-10-CM

## 2012-01-28 NOTE — Progress Notes (Signed)
Presenting complaint; Rre-evaluation for cirrhosis and thrombocytopenia. Umbilicus hernia raphe canceled because of platelet count of 34K.  Subjective:  [name] is 47 year old Caucasian male who has biopsy-proven advanced cirrhosis secondary to alcoholic(ASH) and nonalcoholic steatohepatitis (NASH). Patient states he hasn't had any alcohol drink since he came to her office for the first time in November 2012. Water markers for hepatitis B and C. were negative. Similarly markers for alpha-1 antitrypsin deficiency, Wilson disease, autoimmune hepatitis and hemochromatosis were all negative. He had liver biopsy in December 2012 revealing stage IV fibrosis and he had mild steatosis. He had EGD in February 2013 which revealed short columns of grade 1 esophageal varices and portal gastropathy. He had colonoscopy in April 2013 with removal of 4 small polyps and 3 vertebral or adenomas. 2 polyps were snared. He also had mild sigmoid diverticulosis as well as internal hemorrhoids. He was last seen her office on 11/14/2011 and weight 371 pounds. He had lost 10 pounds in the preceding 3 months. Patient was referred to Dr. Armandina Gemma by his primary care office for umbilical herniorrhaphy. Preop lab studies pertinent for platelet count of 34K. therefore surgery was canceled and patient was asked to followup with [place]. Patient has gained 16 pounds since his last visit. He states he actually has already lost 4 pounds. He admits to cooking and eating too much food during the holidays. He is walking some but this is limited because of bilateral ankle pain and arthritis. He is having intermittent pain at umbilicus hernia. He remains with a very good appetite. He denies nausea vomiting melena or rectal bleeding. He states he had his platelet count checked again 10 days ago and was 43,000. Patient states his heartburn is well controlled with Nexium  He denies problems with attention span, somnolence or confusion.he states  he is unable to sleep unless he takes zolpidem. Patient is not sure why he is on topiramate.      Current Medications: Current Outpatient Prescriptions  Medication Sig Dispense Refill  . albuterol (PROVENTIL HFA;VENTOLIN HFA) 108 (90 BASE) MCG/ACT inhaler Inhale 2 puffs into the lungs every 6 (six) hours as needed. For shortness of breath      . ALPRAZolam (XANAX) 0.5 MG tablet Take 0.5 mg by mouth 3 (three) times daily as needed. For anxiety      . COLCRYS 0.6 MG tablet Take 0.6 mg by mouth as needed. For gout      . diltiazem (CARDIZEM) 90 MG tablet Take 90 mg by mouth 3 (three) times daily.       Marland Kitchen ezetimibe (ZETIA) 10 MG tablet Take 10 mg by mouth daily.       . fluticasone (FLONASE) 50 MCG/ACT nasal spray Place 2 sprays into both nostrils daily as needed. For allergies      . Fluticasone-Salmeterol (ADVAIR) 250-50 MCG/DOSE AEPB Inhale 1 puff into the lungs daily.       . folic acid (FOLVITE) 741 MCG tablet Take 400 mcg by mouth daily.      Marland Kitchen HYDROcodone-ibuprofen (VICOPROFEN) 7.5-200 MG per tablet Take 1 tablet by mouth every 6 (six) hours as needed. For pain      . ipratropium (ATROVENT) 0.02 % nebulizer solution Take 500 mcg by nebulization 4 (four) times daily as needed. For wheezing      . lidocaine (XYLOCAINE) 5 % ointment Apply 1 application topically 4 (four) times daily as needed. To knees and ankle as needed for pain      . Menthol-Methyl Salicylate (MUSCLE  RUB) 10-15 % CREA Apply 1 application topically as needed. For muscle pain      . milk thistle 175 MG tablet Take 175 mg by mouth 4 (four) times daily.       . Multiple Vitamin (MULITIVITAMIN WITH MINERALS) TABS Take 1 tablet by mouth daily.      Marland Kitchen neomycin-bacitracin-polymyxin (NEOSPORIN) ointment Apply 1 application topically every 12 (twelve) hours as needed. For cut on ankle      . NEXIUM 40 MG capsule Take 40 mg by mouth Daily.       . phenylephrine (NASAL DECONGESTANT) 1 % nasal spray Place 1 drop into the nose every 4  (four) hours as needed. For allergies      . potassium chloride (K-DUR) 10 MEQ tablet Take 1 tablet by mouth daily.      . pregabalin (LYRICA) 150 MG capsule Take 150-300 mg by mouth 2 (two) times daily. One in am and two and night      . testosterone (ANDROGEL) 50 MG/5GM GEL Place 5 g onto the skin daily.        Marland Kitchen topiramate (TOPAMAX) 100 MG tablet Take 100 mg by mouth daily.       Marland Kitchen torsemide (DEMADEX) 20 MG tablet Take 20-40 mg by mouth 2 (two) times daily. 2 tabs in the morning and 1 tab 6 hours later      . zolpidem (AMBIEN) 10 MG tablet Take 10 mg by mouth at bedtime.       . metFORMIN (GLUCOPHAGE) 500 MG tablet Take 500 mg by mouth as needed. For high blood sugar         Objective: Blood pressure 130/78, pulse 80, temperature 98 F (36.7 C), temperature source Oral, resp. rate 20, height 6' (1.829 m), weight 387 lb 11.2 oz (175.86 kg). patient is alert and in no acute distress . Asterixis is absent . Conjunctiva is pink. Sclera is nonicteric Oropharyngeal mucosa is normal. No neck masses or thyromegaly noted. Cardiac exam with regular rhythm normal S1 and S2. No murmur or gallop noted. Lungs are clear to auscultation. Abdomen is obese with umbilicus hernia.skin at hernia has bluish discoloration. He also has bruise at right upper quadrant. It is soft and nontender.he has hepatosplenomegaly.shifting dullness is absent  He has 1+ pitting edema involving both legs   Labs/studies Results:  lab data from 01/14/2012. WBC 2.8, H&H 14 and 40.3 and platelet count 34K Bilirubin oh 0.9, AP 61, AST 35, ALT 25, total protein 7.0, albumin 4.4 calcium 9.3, electrolytes are normal. BUN 15 and creatinine 1.10   Assessment;  #1. Thrombocytopenia. Chronic thrombocytopenia secondary to advanced cirrhosis. He is at thrombocytopenia for more than 5 years but his platelet count has been less than 50,000 since 2013. He previously has been evaluated by Dr. Marcene Duos. He does not need further workup. He  will need preop platelet transfusion to reduce risk of bleeding. #2. Biopsy proven advanced cirrhosis. Etiology of cirrhosis felt to be secondary to ASH and NASH. His serum albumin is normal. He has well preserved hepatic found. MELD score is 9. He must pursue lifestyle modification and in lose weight so that he can postpone complications of cirrhosis. He is due for AFP. Last abdominal CT was in March 2013. I would be impossible to do high-quality ultrasound given body habitus. Will plan abdominal CT to screen for [facility] after next visit.    Plan: Alpha-fetoprotein. Once again need for weight reduction and regular exercise stress to the patient. Consider  platelet aspiration prior to surgery. Patient also advised to check with primary care physician if he needs to be on topiramate(he does not know why he is taking this medication). Office visit in 4 months.

## 2012-01-28 NOTE — Patient Instructions (Addendum)
Alpha-fetoprotein with next blood draw.

## 2012-01-30 ENCOUNTER — Ambulatory Visit (INDEPENDENT_AMBULATORY_CARE_PROVIDER_SITE_OTHER): Payer: Medicare Other | Admitting: Cardiology

## 2012-01-30 ENCOUNTER — Encounter: Payer: Self-pay | Admitting: Cardiology

## 2012-01-30 VITALS — BP 115/80 | HR 80 | Ht 72.0 in | Wt 389.0 lb

## 2012-01-30 DIAGNOSIS — I2789 Other specified pulmonary heart diseases: Secondary | ICD-10-CM

## 2012-01-30 DIAGNOSIS — I4891 Unspecified atrial fibrillation: Secondary | ICD-10-CM

## 2012-01-30 DIAGNOSIS — I2510 Atherosclerotic heart disease of native coronary artery without angina pectoris: Secondary | ICD-10-CM

## 2012-01-30 DIAGNOSIS — I5032 Chronic diastolic (congestive) heart failure: Secondary | ICD-10-CM

## 2012-01-30 NOTE — Progress Notes (Signed)
HPI The patient returns for follow up of diastolic heart failure and a history of atrial fibrillation. Since I last saw him he has had no new cardiovascular complaints. He occasionally gets palpitations treat with an extra dose heart is in a possibly Xanax. He has had no sustained symptomatic dysrhythmias. He reports no new shortness of breath. His oxygen was actually saw Dr. Elsworth Soho although he still uses CPAP. Unfortunately although he had lost quite a bit of weight he has gained some of it back. He is not as active as I would like. He is not describing any new PND or orthopnea. He's not describing any chest pressure, neck or arm discomfort.  Of note he was going to have a periumbilical hernia surgery but this was postponed because of his thrombocytopenia.  Allergies  Allergen Reactions  . Oxycodone Base Itching and Nausea And Vomiting  . Prozac (Fluoxetine Hcl) Itching    Current Outpatient Prescriptions  Medication Sig Dispense Refill  . albuterol (PROVENTIL HFA;VENTOLIN HFA) 108 (90 BASE) MCG/ACT inhaler Inhale 2 puffs into the lungs every 6 (six) hours as needed. For shortness of breath      . ALPRAZolam (XANAX) 0.5 MG tablet Take 0.5 mg by mouth 3 (three) times daily as needed. For anxiety      . COLCRYS 0.6 MG tablet Take 0.6 mg by mouth as needed. For gout      . diltiazem (CARDIZEM) 90 MG tablet Take 90 mg by mouth 3 (three) times daily.       Marland Kitchen ezetimibe (ZETIA) 10 MG tablet Take 10 mg by mouth daily.       . fluticasone (FLONASE) 50 MCG/ACT nasal spray Place 2 sprays into both nostrils daily as needed. For allergies      . Fluticasone-Salmeterol (ADVAIR) 250-50 MCG/DOSE AEPB Inhale 1 puff into the lungs daily as needed.       . folic acid (FOLVITE) 977 MCG tablet Take 400 mcg by mouth daily.      Marland Kitchen HYDROcodone-ibuprofen (VICOPROFEN) 7.5-200 MG per tablet Take 1 tablet by mouth every 6 (six) hours as needed. For pain      . ipratropium (ATROVENT) 0.02 % nebulizer solution Take 500  mcg by nebulization 4 (four) times daily as needed. For wheezing      . lidocaine (XYLOCAINE) 5 % ointment Apply 1 application topically 4 (four) times daily as needed. To knees and ankle as needed for pain      . Menthol-Methyl Salicylate (MUSCLE RUB) 10-15 % CREA Apply 1 application topically as needed. For muscle pain      . milk thistle 175 MG tablet Take 175 mg by mouth 4 (four) times daily.       . Multiple Vitamin (MULITIVITAMIN WITH MINERALS) TABS Take 1 tablet by mouth daily.      Marland Kitchen neomycin-bacitracin-polymyxin (NEOSPORIN) ointment Apply 1 application topically every 12 (twelve) hours as needed. For cut on ankle      . NEXIUM 40 MG capsule Take 40 mg by mouth Daily.       . phenylephrine (NASAL DECONGESTANT) 1 % nasal spray Place 1 drop into the nose every 4 (four) hours as needed. For allergies      . potassium chloride (K-DUR) 10 MEQ tablet Take 1 tablet by mouth daily.      . pregabalin (LYRICA) 150 MG capsule Take 150-300 mg by mouth 2 (two) times daily. One in am and two and night      . testosterone (ANDROGEL) 50  MG/5GM GEL Place 5 g onto the skin daily.        Marland Kitchen topiramate (TOPAMAX) 100 MG tablet Take 100 mg by mouth daily.       Marland Kitchen torsemide (DEMADEX) 20 MG tablet Take 20-40 mg by mouth 2 (two) times daily. 2 tabs in the morning and 1 tab 6 hours later      . zolpidem (AMBIEN) 10 MG tablet Take 10 mg by mouth at bedtime.         Past Medical History  Diagnosis Date  . Gout   . Diabetes mellitus     type 2  . RA (rheumatoid arthritis)   . COPD (chronic obstructive pulmonary disease)     with cpap  . Atrial fibrillation   . Cirrhosis   . Anxiety   . Heart murmur   . CHF (congestive heart failure)   . Constipation   . Sleep apnea     cpap used- level 10 and greater  . Neuromuscular disorder     neuropathy in hands and feet  . Cirrhosis     NASH  . Thrombocytopenia     Past Surgical History  Procedure Date  . Sciatic nerve exploration   . Tonsillectomy   .  Esophagogastroduodenoscopy 02/28/2011    Procedure: ESOPHAGOGASTRODUODENOSCOPY (EGD);  Surgeon: Rogene Houston, MD;  Location: [facility];  Service: Endoscopy;  Laterality: N/A;  1200  . Colonoscopy 05/08/2011    Procedure: COLONOSCOPY;  Surgeon: Rogene Houston, MD;  Location: [facility];  Service: Endoscopy;  Laterality: N/A;  730  . Hernia repair     as child  . Liver biopsy 2012  . Ankle surgery     right ankle talor repair    ROS:  Positive for sensation of nasal congestion. Otherwise as stated in the HPI and negative for all other systems.  PHYSICAL EXAM BP 115/80  Pulse 80  Ht 6' (1.829 m)  Wt 389 lb (176.449 kg)  BMI 52.76 kg/m2 GENERAL:  Well appearing HEENT:  Pupils equal round and reactive, fundi not visualized, oral mucosa unremarkable NECK:  No jugular venous distention, waveform within normal limits, carotid upstroke brisk and symmetric, no bruits, no thyromegaly LYMPHATICS:  No cervical, inguinal adenopathy LUNGS:  Clear to auscultation bilaterally BACK:  No CVA tenderness CHEST:  Unremarkable HEART:  PMI not displaced or sustained,S1 and S2 within normal limits, no S3, no S4, no clicks, no rubs, no murmurs ABD:  Flat, positive bowel sounds normal in frequency in pitch, no bruits, no rebound, no guarding, no midline pulsatile mass, no hepatomegaly, no splenomegaly, obese EXT:  2 plus pulses throughout, trace edema, no cyanosis no clubbing SKIN:  No rashes no nodules NEURO:  Cranial nerves II through XII grossly intact, motor grossly intact throughout PSYCH:  Cognitively intact, oriented to person place and time   EKG:  Sinus rhythm, rate 79, axis within normal limits, intervals within normal limits, no acute ST-T wave changes. 01/30/2012   ASSESSMENT AND PLAN   ATRIAL FIBRILLATION - The patient has not had any documented recurrence of this although he has some palpitations occasionally. Given his liver disease and thrombocytopenia and relatively low  thromboembolic risk I am avoiding systemic anticoagulation.  OBESITY, MORBID -  We again discussed the need for weight loss.  DIASTOLIC HEART FAILURE, CHRONIC - He had a mildly reduced ejection fraction at the time of the previous catheterization. I would like to reassess this with an echocardiogram.

## 2012-01-30 NOTE — Patient Instructions (Addendum)
The current medical regimen is effective;  continue present plan and medications.  Your physician has requested that you have an echocardiogram. Echocardiography is a painless test that uses sound waves to create images of your heart. It provides your doctor with information about the size and shape of your heart and how well your heart's chambers and valves are working. This procedure takes approximately one hour. There are no restrictions for this procedure.  Follow up in 1 year with Dr Percival Spanish.  You will receive a letter in the mail 2 months before you are due.  Please call us when you receive this letter to schedule your follow up appointment.

## 2012-02-05 ENCOUNTER — Telehealth (INDEPENDENT_AMBULATORY_CARE_PROVIDER_SITE_OTHER): Payer: Self-pay

## 2012-02-05 NOTE — Telephone Encounter (Signed)
Per Dr Gala Lewandowsky request pt notified of need for ov in 4-6 wks to reck hernia and discuss surgery options.

## 2012-02-13 ENCOUNTER — Other Ambulatory Visit: Payer: Self-pay

## 2012-02-13 ENCOUNTER — Other Ambulatory Visit (INDEPENDENT_AMBULATORY_CARE_PROVIDER_SITE_OTHER): Payer: Medicare Other

## 2012-02-13 ENCOUNTER — Encounter (INDEPENDENT_AMBULATORY_CARE_PROVIDER_SITE_OTHER): Payer: Medicare Other | Admitting: Surgery

## 2012-02-13 DIAGNOSIS — I2510 Atherosclerotic heart disease of native coronary artery without angina pectoris: Secondary | ICD-10-CM

## 2012-02-13 DIAGNOSIS — I5032 Chronic diastolic (congestive) heart failure: Secondary | ICD-10-CM

## 2012-02-13 DIAGNOSIS — I2789 Other specified pulmonary heart diseases: Secondary | ICD-10-CM

## 2012-02-13 DIAGNOSIS — I4891 Unspecified atrial fibrillation: Secondary | ICD-10-CM

## 2012-03-11 ENCOUNTER — Ambulatory Visit (INDEPENDENT_AMBULATORY_CARE_PROVIDER_SITE_OTHER): Payer: Medicare Other | Admitting: Internal Medicine

## 2012-03-17 ENCOUNTER — Ambulatory Visit (INDEPENDENT_AMBULATORY_CARE_PROVIDER_SITE_OTHER): Payer: Medicare Other | Admitting: Surgery

## 2012-04-14 ENCOUNTER — Ambulatory Visit (INDEPENDENT_AMBULATORY_CARE_PROVIDER_SITE_OTHER): Payer: Medicare Other | Admitting: Surgery

## 2012-04-15 ENCOUNTER — Other Ambulatory Visit: Payer: Self-pay | Admitting: *Deleted

## 2012-04-15 MED ORDER — HYDROCODONE-IBUPROFEN 7.5-200 MG PO TABS: 1.0000 | ORAL_TABLET | Freq: Four times a day (QID) | ORAL | Status: DC | PRN

## 2012-04-15 MED ORDER — ALPRAZOLAM 0.5 MG PO TABS: 0.5000 mg | ORAL_TABLET | Freq: Three times a day (TID) | ORAL | Status: DC | PRN

## 2012-04-15 NOTE — Telephone Encounter (Signed)
Pt last seen for chronic recheck on 01-15-12. Regular patient of [facility]. Seen regularly.  Xanax last filled on 03-14-12 for #90. Hydrocodone last filled on 03-14-12 as well for #120. Please advise. If approved please have nurse phone in xanax to [facility] and have patient pick up hydrocodone rx. Thank you

## 2012-04-17 ENCOUNTER — Telehealth: Payer: Self-pay | Admitting: Cardiology

## 2012-04-17 ENCOUNTER — Telehealth: Payer: Self-pay | Admitting: Physician Assistant

## 2012-04-17 ENCOUNTER — Encounter: Payer: Self-pay | Admitting: Cardiology

## 2012-04-17 NOTE — Telephone Encounter (Signed)
Left message on VM for Helene Kelp letting her know that Dr. Rosezella Florida nurse, Pam will be in office tomorrow. Note forwarded to [facility].

## 2012-04-17 NOTE — Telephone Encounter (Signed)
Hospice nurse calling to check on status on fax she sent

## 2012-04-18 ENCOUNTER — Other Ambulatory Visit: Payer: Self-pay | Admitting: *Deleted

## 2012-04-18 NOTE — Telephone Encounter (Signed)
Patient has appt with Jacelyn Grip on 4-14. Out of meds and requesting refill. Please advise. If approved please print hydrocodone rx and have pt pick up. Phone number is [phone]. Phone in Ambien to [facility] in [place]. Thank you

## 2012-04-18 NOTE — Telephone Encounter (Signed)
Reviewed pts DX with Helene Kelp along with his last EF from echo 02/23/12 (60- 65%)  She is going to send a fax for an RX for a weight scale to be sent to the pts home.   Aware Dr Percival Spanish not in the office today or next week but that he will sign as soon as he returns.  She states understanding.

## 2012-04-18 NOTE — Telephone Encounter (Signed)
The patient would like his prescription for Hydrocodone to be called into the [facility] pharmacy please.

## 2012-04-18 NOTE — Telephone Encounter (Signed)
Let message for [name] that I did receive the fax but can not read what is documented in the nursing notes.  Requested she re-fax or to call back if there is something I or Dr Percival Spanish need to do.

## 2012-04-19 ENCOUNTER — Telehealth: Payer: Self-pay | Admitting: *Deleted

## 2012-04-19 MED ORDER — ZOLPIDEM TARTRATE 10 MG PO TABS: 10.0000 mg | ORAL_TABLET | Freq: Every day | ORAL | Status: DC

## 2012-04-19 MED ORDER — EZETIMIBE 10 MG PO TABS: 10.0000 mg | ORAL_TABLET | Freq: Every day | ORAL | Status: DC

## 2012-04-19 MED ORDER — HYDROCODONE-IBUPROFEN 7.5-200 MG PO TABS: 1.0000 | ORAL_TABLET | Freq: Four times a day (QID) | ORAL | Status: DC | PRN

## 2012-04-19 NOTE — Telephone Encounter (Signed)
RX for Ambien called into pharmacy per [facility]

## 2012-04-19 NOTE — Telephone Encounter (Signed)
Patient called this morning to check on status of prescriptions.  He really needs them filled today.

## 2012-04-21 ENCOUNTER — Ambulatory Visit: Payer: Self-pay | Admitting: Family Medicine

## 2012-05-02 ENCOUNTER — Other Ambulatory Visit: Payer: Self-pay

## 2012-05-02 MED ORDER — TOPIRAMATE 100 MG PO TABS: 100.0000 mg | ORAL_TABLET | Freq: Every day | ORAL | Status: DC

## 2012-05-02 NOTE — Telephone Encounter (Signed)
Last seen in January 2014

## 2012-05-05 ENCOUNTER — Encounter: Payer: Self-pay | Admitting: Family Medicine

## 2012-05-05 ENCOUNTER — Ambulatory Visit (INDEPENDENT_AMBULATORY_CARE_PROVIDER_SITE_OTHER): Payer: Medicare Other | Admitting: Family Medicine

## 2012-05-05 VITALS — BP 155/95 | HR 73 | Temp 97.0°F | Resp 18 | Ht 72.0 in | Wt 395.0 lb

## 2012-05-05 DIAGNOSIS — D72819 Decreased white blood cell count, unspecified: Secondary | ICD-10-CM

## 2012-05-05 DIAGNOSIS — R160 Hepatomegaly, not elsewhere classified: Secondary | ICD-10-CM

## 2012-05-05 DIAGNOSIS — R162 Hepatomegaly with splenomegaly, not elsewhere classified: Secondary | ICD-10-CM

## 2012-05-05 DIAGNOSIS — M109 Gout, unspecified: Secondary | ICD-10-CM

## 2012-05-05 DIAGNOSIS — I5032 Chronic diastolic (congestive) heart failure: Secondary | ICD-10-CM

## 2012-05-05 DIAGNOSIS — R7402 Elevation of levels of lactic acid dehydrogenase (LDH): Secondary | ICD-10-CM

## 2012-05-05 DIAGNOSIS — E291 Testicular hypofunction: Secondary | ICD-10-CM

## 2012-05-05 DIAGNOSIS — I2510 Atherosclerotic heart disease of native coronary artery without angina pectoris: Secondary | ICD-10-CM

## 2012-05-05 DIAGNOSIS — K7689 Other specified diseases of liver: Secondary | ICD-10-CM

## 2012-05-05 DIAGNOSIS — E119 Type 2 diabetes mellitus without complications: Secondary | ICD-10-CM

## 2012-05-05 DIAGNOSIS — R748 Abnormal levels of other serum enzymes: Secondary | ICD-10-CM

## 2012-05-05 DIAGNOSIS — D696 Thrombocytopenia, unspecified: Secondary | ICD-10-CM

## 2012-05-05 DIAGNOSIS — R195 Other fecal abnormalities: Secondary | ICD-10-CM

## 2012-05-05 DIAGNOSIS — R161 Splenomegaly, not elsewhere classified: Secondary | ICD-10-CM

## 2012-05-05 DIAGNOSIS — R7401 Elevation of levels of liver transaminase levels: Secondary | ICD-10-CM

## 2012-05-05 DIAGNOSIS — J45909 Unspecified asthma, uncomplicated: Secondary | ICD-10-CM

## 2012-05-05 DIAGNOSIS — G4733 Obstructive sleep apnea (adult) (pediatric): Secondary | ICD-10-CM

## 2012-05-05 DIAGNOSIS — K7460 Unspecified cirrhosis of liver: Secondary | ICD-10-CM

## 2012-05-05 DIAGNOSIS — K625 Hemorrhage of anus and rectum: Secondary | ICD-10-CM

## 2012-05-05 DIAGNOSIS — E785 Hyperlipidemia, unspecified: Secondary | ICD-10-CM

## 2012-05-05 LAB — POCT CBC
Granulocyte percent: 65.8 %G (ref 37–80)
HCT, POC: 38.5 % — AB (ref 43.5–53.7)
Hemoglobin: 13.3 g/dL — AB (ref 14.1–18.1)
Lymph, poc: 1.1 (ref 0.6–3.4)
MCH, POC: 29.9 pg (ref 27–31.2)
MCHC: 34.5 g/dL (ref 31.8–35.4)
MCV: 86.8 fL (ref 80–97)
MPV: 7.3 fL (ref 0–99.8)
POC Granulocyte: 2.5 (ref 2–6.9)
POC LYMPH PERCENT: 27.8 %L (ref 10–50)
Platelet Count, POC: 60 10*3/uL — AB (ref 142–424)
RBC: 4.4 M/uL — AB (ref 4.69–6.13)
RDW, POC: 14.5 %
WBC: 3.8 10*3/uL — AB (ref 4.6–10.2)

## 2012-05-05 LAB — URIC ACID: Uric Acid, Serum: 9.9 mg/dL — ABNORMAL HIGH (ref 4.0–6.0)

## 2012-05-05 LAB — TSH: TSH: 2.404 u[IU]/mL (ref 0.350–4.500)

## 2012-05-05 MED ORDER — HYDROCODONE-IBUPROFEN 7.5-200 MG PO TABS: 1.0000 | ORAL_TABLET | Freq: Four times a day (QID) | ORAL | Status: DC | PRN

## 2012-05-05 MED ORDER — ZOLPIDEM TARTRATE 10 MG PO TABS: 10.0000 mg | ORAL_TABLET | Freq: Every day | ORAL | Status: DC

## 2012-05-05 MED ORDER — POTASSIUM CHLORIDE ER 10 MEQ PO CPCR: 10.0000 meq | ORAL_CAPSULE | Freq: Every day | ORAL | Status: DC

## 2012-05-05 MED ORDER — EZETIMIBE 10 MG PO TABS: 10.0000 mg | ORAL_TABLET | Freq: Every day | ORAL | Status: DC

## 2012-05-05 MED ORDER — TORSEMIDE 20 MG PO TABS: 20.0000 mg | ORAL_TABLET | Freq: Two times a day (BID) | ORAL | Status: DC

## 2012-05-05 MED ORDER — ALPRAZOLAM 0.5 MG PO TABS: 0.5000 mg | ORAL_TABLET | Freq: Every evening | ORAL | Status: DC | PRN

## 2012-05-05 MED ORDER — PREGABALIN 150 MG PO CAPS: 150.0000 mg | ORAL_CAPSULE | Freq: Two times a day (BID) | ORAL | Status: DC

## 2012-05-05 NOTE — Progress Notes (Signed)
Patient ID: [name], male   DOB: [birth year], 47 y.o.   MRN: [id number] SUBJECTIVE: HPI: Meals: no B'fast: Lunch: Bologna or ham sandwich, Dinner: Meat, starch, some veggies. Came for follow up of his multiple medical problems. Patient his  Disabled. No specific complaints except all his problems associated with being obese and having chronic pain. Recently hospitalized for sepsis at [facility]. Also, fell the day prior to hospitalization and sustained bruises to the left flank blood in stools and back pain. Subsequently discharged home and here for folow up.  Past Medical History  Diagnosis Date  . Gout   . Diabetes mellitus     type 2  . RA (rheumatoid arthritis)   . COPD (chronic obstructive pulmonary disease)     with cpap  . Atrial fibrillation   . Cirrhosis   . Anxiety   . Heart murmur   . CHF (congestive heart failure)   . Constipation   . Sleep apnea     cpap used- level 10 and greater  . Neuromuscular disorder     neuropathy in hands and feet  . Cirrhosis     NASH  . Thrombocytopenia    Past Surgical History  Procedure Laterality Date  . Sciatic nerve exploration    . Tonsillectomy    . Esophagogastroduodenoscopy  02/28/2011    Procedure: ESOPHAGOGASTRODUODENOSCOPY (EGD);  Surgeon: Rogene Houston, MD;  Location: [facility];  Service: Endoscopy;  Laterality: N/A;  1200  . Colonoscopy  05/08/2011    Procedure: COLONOSCOPY;  Surgeon: Rogene Houston, MD;  Location: [facility];  Service: Endoscopy;  Laterality: N/A;  730  . Hernia repair      as child  . Liver biopsy  2012  . Ankle surgery      right ankle talor repair   History   Social History  . Marital Status: Divorced    Spouse Name: N/A    Number of Children: N/A  . Years of Education: N/A   Occupational History  . Not on file.   Social History Main Topics  . Smoking status: Never Smoker   . Smokeless tobacco: Current User    Types: Snuff, Chew     Comment: Chews tobacco  .  Alcohol Use: Yes     Comment: Patient states that he will drink a non-alcoholic ber if and when he drinks  . Drug Use: No     Comment: Use to smoke cocaine and marijuana. No IV drug use  . Sexually Active: Yes   Other Topics Concern  . Not on file   Social History Narrative  . No narrative on file   Family History  Problem Relation Age of Onset  . Colon cancer Mother   . Cancer Mother     intestine  . Cancer Father     throat  . Cancer Brother     brain   Current Outpatient Prescriptions on File Prior to Visit  Medication Sig Dispense Refill  . albuterol (PROVENTIL HFA;VENTOLIN HFA) 108 (90 BASE) MCG/ACT inhaler Inhale 2 puffs into the lungs every 6 (six) hours as needed. For shortness of breath      . COLCRYS 0.6 MG tablet Take 0.6 mg by mouth as needed. For gout      . diltiazem (CARDIZEM) 90 MG tablet Take 90 mg by mouth 3 (three) times daily.       . fluticasone (FLONASE) 50 MCG/ACT nasal spray Place 2 sprays into both nostrils daily as  needed. For allergies      . Fluticasone-Salmeterol (ADVAIR) 250-50 MCG/DOSE AEPB Inhale 1 puff into the lungs daily as needed.       . folic acid (FOLVITE) 053 MCG tablet Take 400 mcg by mouth daily.      Marland Kitchen ipratropium (ATROVENT) 0.02 % nebulizer solution Take 500 mcg by nebulization 4 (four) times daily as needed. For wheezing      . lidocaine (XYLOCAINE) 5 % ointment Apply 1 application topically 4 (four) times daily as needed. To knees and ankle as needed for pain      . Menthol-Methyl Salicylate (MUSCLE RUB) 10-15 % CREA Apply 1 application topically as needed. For muscle pain      . milk thistle 175 MG tablet Take 175 mg by mouth 4 (four) times daily.       . Multiple Vitamin (MULITIVITAMIN WITH MINERALS) TABS Take 1 tablet by mouth daily.      Marland Kitchen neomycin-bacitracin-polymyxin (NEOSPORIN) ointment Apply 1 application topically every 12 (twelve) hours as needed. For cut on ankle      . NEXIUM 40 MG capsule Take 40 mg by mouth Daily.        . phenylephrine (NASAL DECONGESTANT) 1 % nasal spray Place 1 drop into the nose every 4 (four) hours as needed. For allergies      . testosterone (ANDROGEL) 50 MG/5GM GEL Place 5 g onto the skin daily.         No current facility-administered medications on file prior to visit.   Allergies  Allergen Reactions  . Oxycodone Base Itching and Nausea And Vomiting  . Prozac (Fluoxetine Hcl) Itching   Immunization History  Administered Date(s) Administered  . Influenza Whole 10/11/2008, 01/09/2010, 11/09/2011  . Pneumococcal Polysaccharide 09/08/2008  . Td 01/09/2008   Prior to Admission medications   Medication Sig Start Date End Date Taking? Authorizing Provider  albuterol (PROVENTIL HFA;VENTOLIN HFA) 108 (90 BASE) MCG/ACT inhaler Inhale 2 puffs into the lungs every 6 (six) hours as needed. For shortness of breath   Yes Historical Provider, MD  ALPRAZolam (XANAX) 0.5 MG tablet Take 1 tablet (0.5 mg total) by mouth at bedtime as needed. For anxiety 05/05/12  Yes Vernie Shanks, MD  COLCRYS 0.6 MG tablet Take 0.6 mg by mouth as needed. For gout 12/24/11  Yes Historical Provider, MD  diltiazem (CARDIZEM) 90 MG tablet Take 90 mg by mouth 3 (three) times daily.    Yes Historical Provider, MD  ezetimibe (ZETIA) 10 MG tablet Take 1 tablet (10 mg total) by mouth daily. 05/05/12  Yes Vernie Shanks, MD  fluticasone (FLONASE) 50 MCG/ACT nasal spray Place 2 sprays into both nostrils daily as needed. For allergies 05/23/11  Yes Historical Provider, MD  Fluticasone-Salmeterol (ADVAIR) 250-50 MCG/DOSE AEPB Inhale 1 puff into the lungs daily as needed.    Yes Historical Provider, MD  folic acid (FOLVITE) 976 MCG tablet Take 400 mcg by mouth daily.   Yes Historical Provider, MD  HYDROcodone-ibuprofen (VICOPROFEN) 7.5-200 MG per tablet Take 1 tablet by mouth every 6 (six) hours as needed. For pain 05/05/12  Yes Vernie Shanks, MD  ipratropium (ATROVENT) 0.02 % nebulizer solution Take 500 mcg by nebulization 4  (four) times daily as needed. For wheezing   Yes Historical Provider, MD  lidocaine (XYLOCAINE) 5 % ointment Apply 1 application topically 4 (four) times daily as needed. To knees and ankle as needed for pain 08/08/11  Yes Historical Provider, MD  Menthol-Methyl Salicylate (MUSCLE RUB) 10-15 %  CREA Apply 1 application topically as needed. For muscle pain   Yes Historical Provider, MD  milk thistle 175 MG tablet Take 175 mg by mouth 4 (four) times daily.    Yes Historical Provider, MD  Multiple Vitamin (MULITIVITAMIN WITH MINERALS) TABS Take 1 tablet by mouth daily.   Yes Historical Provider, MD  neomycin-bacitracin-polymyxin (NEOSPORIN) ointment Apply 1 application topically every 12 (twelve) hours as needed. For cut on ankle   Yes Historical Provider, MD  NEXIUM 40 MG capsule Take 40 mg by mouth Daily.  11/29/11  Yes Historical Provider, MD  phenylephrine (NASAL DECONGESTANT) 1 % nasal spray Place 1 drop into the nose every 4 (four) hours as needed. For allergies   Yes Historical Provider, MD  potassium chloride (MICRO-K) 10 MEQ CR capsule Take 1 capsule (10 mEq total) by mouth daily. 05/05/12  Yes Vernie Shanks, MD  pregabalin (LYRICA) 150 MG capsule Take 1-2 capsules (150-300 mg total) by mouth 2 (two) times daily. One in am and two and night 05/05/12  Yes Vernie Shanks, MD  testosterone (ANDROGEL) 50 MG/5GM GEL Place 5 g onto the skin daily.     Yes Historical Provider, MD  torsemide (DEMADEX) 20 MG tablet Take 1-2 tablets (20-40 mg total) by mouth 2 (two) times daily. 2 tabs in the morning and 1 tab 6 hours later 05/05/12  Yes Vernie Shanks, MD  zolpidem (AMBIEN) 10 MG tablet Take 1 tablet (10 mg total) by mouth at bedtime. 05/05/12  Yes Vernie Shanks, MD    ROS: As above in the HPI. All other systems are stable or negative.  OBJECTIVE: APPEARANCE:  Morbidly obese White male. Poor habitus Frequently spitting out chewing tobacco into a bottle. Patient in no acute distress.The patient  appeared well nourished and normally developed. Acyanotic. Waist: VITAL SIGNS:BP 155/95  Pulse 73  Temp(Src) 97 F (36.1 C) (Oral)  Resp 18  Ht 6' (1.829 m)  Wt 395 lb (179.171 kg)  BMI 53.56 kg/m2   SKIN: warm and  Dry without overt rashes, tattoos and scars. Bruises old left flank area.  HEAD and Neck: without JVD, Head and scalp: normal Eyes:No scleral icterus. Fundi normal, eye movements normal. Ears: Auricle normal, canal normal, Tympanic membranes normal, insufflation normal. Nose: normal Throat: normal tongue : no lesions  seen Neck & thyroid: normal  CHEST & LUNGS: Chest wall: normal Lungs: Coarse  Breath sounds. No Rales. Prolonged expiratory phase.  CVS: Reveals the PMI to be normally located. Regular rhythm, First and Second Heart sounds are normal,  absence of murmurs, rubs or gallops. Peripheral vasculature: Radial pulses: normal Dorsal pedis pulses: normal Posterior pulses: normal  ABDOMEN:  Appearance:obese. Benign,, no organomegaly, no masses, no Abdominal Aortic enlargement. No Guarding , no rebound. No Bruits. Bowel sounds: normal   EXTREMETIES:edema2+ Both legs MUSCULOSKELETAL:  Spine:abnormal. Surgical lumbar scar. Reduced range of motion. Joints: painful range of motion of knees and ankles.  NEUROLOGIC: oriented to time,place and person; nonfocal. Strength is normal  ASSESSMENT: CAD - Plan: COMPLETE METABOLIC PANEL WITH GFR, NMR Lipoprofile with Lipids  DIABETES MELLITUS - Plan: COMPLETE METABOLIC PANEL WITH GFR  GOUT - Plan: COMPLETE METABOLIC PANEL WITH GFR, Uric acid  DIASTOLIC HEART FAILURE, CHRONIC - Plan: COMPLETE METABOLIC PANEL WITH GFR  OBESITY, MORBID  SLEEP APNEA, OBSTRUCTIVE  Asthma  THROMBOCYTOPENIA  Rectal bleeding - Plan: POCT CBC, Fecal occult blood, imunochemical  Morbid obesity - Plan: TSH  Hypogonadism male - Plan: TSH, CANCELED: Testosterone, Total &  Free Direct  Hepatosplenomegaly  Elevated liver  enzymes  Enlarged liver  Thrombocytopenia  Leukopenia  Splenomegaly  Hyperlipemia  Other testicular hypofunction - Plan: Testosterone, Total & Free Direct  Cirrhosis of liver without mention of alcohol   PLAN: Orders Placed This Encounter  Procedures  . Fecal occult blood, imunochemical  . COMPLETE METABOLIC PANEL WITH GFR  . Uric acid  . TSH  . NMR Lipoprofile with Lipids  . Testosterone, Total & Free Direct    Standing Status: Future     Number of Occurrences:      Standing Expiration Date: 05/05/2013  . POCT CBC   Meds ordered this encounter  Medications  . HYDROcodone-ibuprofen (VICOPROFEN) 7.5-200 MG per tablet    Sig: Take 1 tablet by mouth every 6 (six) hours as needed. For pain    Dispense:  120 tablet    Refill:  0  . DISCONTD: ALPRAZolam (XANAX) 0.5 MG tablet    Sig: Take 1 tablet (0.5 mg total) by mouth at bedtime as needed. For anxiety    Dispense:  30 tablet    Refill:  0  . zolpidem (AMBIEN) 10 MG tablet    Sig: Take 1 tablet (10 mg total) by mouth at bedtime.    Dispense:  30 tablet    Refill:  1  . ezetimibe (ZETIA) 10 MG tablet    Sig: Take 1 tablet (10 mg total) by mouth daily.    Dispense:  30 tablet    Refill:  5  . torsemide (DEMADEX) 20 MG tablet    Sig: Take 1-2 tablets (20-40 mg total) by mouth 2 (two) times daily. 2 tabs in the morning and 1 tab 6 hours later    Dispense:  90 tablet    Refill:  5  . pregabalin (LYRICA) 150 MG capsule    Sig: Take 1-2 capsules (150-300 mg total) by mouth 2 (two) times daily. One in am and two and night    Dispense:  60 capsule    Refill:  5  . potassium chloride (MICRO-K) 10 MEQ CR capsule    Sig: Take 1 capsule (10 mEq total) by mouth daily.    Dispense:  30 capsule    Refill:  5  . ALPRAZolam (XANAX) 0.5 MG tablet    Sig: Take 1 tablet (0.5 mg total) by mouth at bedtime as needed. For anxiety    Dispense:  30 tablet    Refill:  0        Dr Paula Libra Recommendations  Diet and Exercise  discussed with patient.  For nutrition information, I recommend books:  1).Eat to Live by Dr Excell Seltzer. 2).Prevent and Reverse Heart Disease by Dr Karl Luke.  Exercise recommendations are:  If unable to walk, then the patient can exercise in a chair 3 times a day. By flapping arms like a bird gently and raising legs outwards to the front.  If ambulatory, the patient can go for walks for 30 minutes 3 times a week. Then increase the intensity and duration as tolerated.  Goal is to try to attain exercise frequency to 5 times a week.  If applicable: Best to perform resistance exercises (machines or weights) 2 days a week and cardio type exercises 3 days per week.  Long discussion on his morbidity and mortality risks with his morbid obesity. Patient was appreciative of the guidance. Await labs.  RTc 3 months.

## 2012-05-05 NOTE — Patient Instructions (Addendum)
      Dr Paula Libra Recommendations  Diet and Exercise discussed with patient.  For nutrition information, I recommend books:  1).Eat to Live by Dr Excell Seltzer. 2).Prevent and Reverse Heart Disease by Dr Karl Luke.  Exercise recommendations are:  If unable to walk, then the patient can exercise in a chair 3 times a day. By flapping arms like a bird gently and raising legs outwards to the front.  If ambulatory, the patient can go for walks for 30 minutes 3 times a week. Then increase the intensity and duration as tolerated.  Goal is to try to attain exercise frequency to 5 times a week.  If applicable: Best to perform resistance exercises (machines or weights) 2 days a week and cardio type exercises 3 days per week.

## 2012-05-06 ENCOUNTER — Other Ambulatory Visit: Payer: Self-pay | Admitting: *Deleted

## 2012-05-06 ENCOUNTER — Other Ambulatory Visit: Payer: Medicare Other

## 2012-05-06 ENCOUNTER — Other Ambulatory Visit: Payer: Self-pay | Admitting: Family Medicine

## 2012-05-06 DIAGNOSIS — E291 Testicular hypofunction: Secondary | ICD-10-CM

## 2012-05-06 LAB — COMPLETE METABOLIC PANEL WITH GFR
ALT: 19 U/L (ref 0–53)
AST: 27 U/L (ref 0–37)
Albumin: 3.9 g/dL (ref 3.5–5.2)
Alkaline Phosphatase: 64 U/L (ref 39–117)
BUN: 16 mg/dL (ref 6–23)
CO2: 21 mEq/L (ref 19–32)
Calcium: 8.9 mg/dL (ref 8.4–10.5)
Chloride: 108 mEq/L (ref 96–112)
Creat: 0.87 mg/dL (ref 0.50–1.35)
GFR, Est African American: >89 mL/min
GFR, Est Non African American: >89 mL/min
Glucose, Bld: 84 mg/dL (ref 70–99)
Potassium: 4 mEq/L (ref 3.5–5.3)
Sodium: 138 mEq/L (ref 135–145)
Total Bilirubin: 1 mg/dL (ref 0.3–1.2)
Total Protein: 7.3 g/dL (ref 6.0–8.3)

## 2012-05-06 LAB — NMR LIPOPROFILE WITH LIPIDS
Cholesterol, Total: 97 mg/dL (ref <200)
HDL Particle Number: 19.1 umol/L — ABNORMAL LOW (ref >=30.5)
HDL Size: 9.1 nm — ABNORMAL LOW (ref >=9.2)
HDL-C: 30 mg/dL — ABNORMAL LOW (ref >=40)
LDL (calc): 46 mg/dL (ref <100)
LDL Particle Number: 745 nmol/L (ref <1000)
LDL Size: 20 nm — ABNORMAL LOW (ref >20.5)
LP-IR Score: 49 — ABNORMAL HIGH (ref <=45)
Large HDL-P: 3.3 umol/L — ABNORMAL LOW (ref >=4.8)
Large VLDL-P: 1.6 nmol/L (ref <=2.7)
Small LDL Particle Number: 507 nmol/L (ref <=527)
Triglycerides: 107 mg/dL (ref <150)
VLDL Size: 49.5 nm — ABNORMAL HIGH (ref <=46.6)

## 2012-05-06 MED ORDER — ALLOPURINOL 100 MG PO TABS: 200.0000 mg | ORAL_TABLET | Freq: Every day | ORAL | Status: DC

## 2012-05-06 NOTE — Addendum Note (Signed)
Addended by: Wyline Mood on: 05/06/2012 09:41 AM   Modules accepted: Orders

## 2012-05-06 NOTE — Progress Notes (Signed)
Patient ID: [name], male   DOB: Apr 01, 1965, 47 y.o.   MRN: [id number] Discussed today 05/06/2012 with patient his CBC result with Thrombocytopenis and mild leukopenia. These levels are good for him. His pancytopenia is due to cirrhosis and he used to be seen by hematology but since GI follows him regularly he only has to see heme on a prn basis. Await the rest of the labs.  Lunetta Marina P. Jacelyn Grip, M.D.

## 2012-05-06 NOTE — Progress Notes (Signed)
Patient came in for testosterone only

## 2012-05-07 ENCOUNTER — Telehealth (INDEPENDENT_AMBULATORY_CARE_PROVIDER_SITE_OTHER): Payer: Self-pay | Admitting: *Deleted

## 2012-05-07 LAB — TESTOSTERONE, TOTAL AND FREE DIRECT MEASURE
Free Testosterone, Direct: 0.8 pg/mL — ABNORMAL LOW (ref 3.8–34.2)
Testosterone: 196.63 ng/dL — ABNORMAL LOW (ref 300–890)

## 2012-05-07 LAB — FECAL OCCULT BLOOD, IMMUNOCHEMICAL: Fecal Occult Blood: POSITIVE — AB

## 2012-05-07 NOTE — Telephone Encounter (Signed)
Patient's lab work is in [facility], this message was forwarded to [employer] so that he may review labs. Patient called and made aware,message left

## 2012-05-07 NOTE — Telephone Encounter (Signed)
[name] stating he had blood work at Dr. Tawanna Sat office this week and would like for Dr. Laural Golden to take a look at them. His return phone number is [phone].

## 2012-05-09 NOTE — Telephone Encounter (Signed)
Noted , forwarded to Butch Penny as a reminder

## 2012-05-09 NOTE — Telephone Encounter (Signed)
Lab reviewed. Patient has an appointment this month.

## 2012-05-12 NOTE — Addendum Note (Signed)
Addended by: Levora Angel on: 05/12/2012 03:00 PM   Modules accepted: Orders

## 2012-05-12 NOTE — Telephone Encounter (Signed)
[name] seen PCP on 05/05/12, info in [facility], and blood was found in his stool. Reminder [name] of his apt that is scheduled for 05/27/12.

## 2012-05-12 NOTE — Telephone Encounter (Signed)
Dr.Rehman was made aware

## 2012-05-13 ENCOUNTER — Telehealth: Payer: Self-pay | Admitting: Cardiology

## 2012-05-13 NOTE — Telephone Encounter (Signed)
Follow Up     Following up on fax regarding a prescription for a scale faxed 4/22. Please call.

## 2012-05-14 NOTE — Telephone Encounter (Signed)
Left message that I do not have a RX for a scale and requested she refax

## 2012-05-14 NOTE — Telephone Encounter (Signed)
Done by nurse

## 2012-05-21 ENCOUNTER — Ambulatory Visit: Payer: Self-pay | Admitting: Family Medicine

## 2012-05-27 ENCOUNTER — Other Ambulatory Visit (INDEPENDENT_AMBULATORY_CARE_PROVIDER_SITE_OTHER): Payer: Self-pay | Admitting: *Deleted

## 2012-05-27 ENCOUNTER — Ambulatory Visit (INDEPENDENT_AMBULATORY_CARE_PROVIDER_SITE_OTHER): Payer: Medicare Other | Admitting: Internal Medicine

## 2012-05-27 ENCOUNTER — Encounter (INDEPENDENT_AMBULATORY_CARE_PROVIDER_SITE_OTHER): Payer: Self-pay | Admitting: Internal Medicine

## 2012-05-27 ENCOUNTER — Encounter (INDEPENDENT_AMBULATORY_CARE_PROVIDER_SITE_OTHER): Payer: Self-pay | Admitting: *Deleted

## 2012-05-27 VITALS — BP 138/90 | HR 82 | Temp 96.7°F | Resp 20 | Ht 72.0 in | Wt >= 6400 oz

## 2012-05-27 DIAGNOSIS — K921 Melena: Secondary | ICD-10-CM

## 2012-05-27 DIAGNOSIS — K7460 Unspecified cirrhosis of liver: Secondary | ICD-10-CM

## 2012-05-27 DIAGNOSIS — D696 Thrombocytopenia, unspecified: Secondary | ICD-10-CM

## 2012-05-27 DIAGNOSIS — R195 Other fecal abnormalities: Secondary | ICD-10-CM

## 2012-05-27 NOTE — Progress Notes (Signed)
Presenting complaint;  Followup for cirrhosis. Patient reports recent episode of melena.  Subjective:  Patient is 47 year old Caucasian male who has advanced cirrhosis secondary to NAFLD. He had liver biopsy in December 2012 revealing stage IV fibrosis. He was last seen 4 months ago. He has gained 14 pounds since his last visit. He states he gained weight following hospitalization at [facility] one month ago for sepsis secondary to cellulitis. He states he is change in eating habits. He is eating 5 small meals in a car seat more fruits and vegetables and less meat. She is also raising her garden and tries to walk on [place] track usually 2-3 times a day about 4-5 minutes each time. He states his stamina is getting better as initially he was only able to walk for 1 minute. He reports tarry stools about 3 weeks ago. Since then he had Hemoccult and was positive. He is taking Vicoprofen 2-3 times per day for joint and back pains. He denies abdominal pain. Patient's last EGD was in January 2013 revealing grade 1 esophageal varices and portal gastropathy.  Current Medications: Current Outpatient Prescriptions  Medication Sig Dispense Refill  . albuterol (PROVENTIL HFA;VENTOLIN HFA) 108 (90 BASE) MCG/ACT inhaler Inhale 2 puffs into the lungs every 6 (six) hours as needed. For shortness of breath      . allopurinol (ZYLOPRIM) 100 MG tablet Take 2 tablets (200 mg total) by mouth daily.  60 tablet  6  . ALPRAZolam (XANAX) 0.5 MG tablet Take 1 tablet (0.5 mg total) by mouth at bedtime as needed. For anxiety  30 tablet  0  . COLCRYS 0.6 MG tablet Take 0.6 mg by mouth as needed. For gout      . diltiazem (CARDIZEM) 90 MG tablet Take 90 mg by mouth 3 (three) times daily.       Marland Kitchen ezetimibe (ZETIA) 10 MG tablet Take 1 tablet (10 mg total) by mouth daily.  30 tablet  5  . fluticasone (FLONASE) 50 MCG/ACT nasal spray Place 2 sprays into both nostrils daily as needed. For allergies      . folic acid (FOLVITE) 500 MCG  tablet Take 400 mcg by mouth daily.      Marland Kitchen HYDROcodone-ibuprofen (VICOPROFEN) 7.5-200 MG per tablet Take 1 tablet by mouth every 6 (six) hours as needed. For pain  120 tablet  0  . hydrocortisone 1 % ointment Apply 1 application topically at bedtime. Patient states that this is a 2% Oint.      Marland Kitchen ipratropium (ATROVENT) 0.02 % nebulizer solution Take 500 mcg by nebulization 4 (four) times daily as needed. For wheezing      . lidocaine (XYLOCAINE) 5 % ointment Apply 1 application topically 4 (four) times daily as needed. To knees and ankle as needed for pain      . Menthol-Methyl Salicylate (MUSCLE RUB) 10-15 % CREA Apply 1 application topically as needed. For muscle pain      . milk thistle 175 MG tablet Take 175 mg by mouth 4 (four) times daily.       . Multiple Vitamin (MULITIVITAMIN WITH MINERALS) TABS Take 1 tablet by mouth daily.      Marland Kitchen neomycin-bacitracin-polymyxin (NEOSPORIN) ointment Apply 1 application topically every 12 (twelve) hours as needed. For cut on ankle      . NEXIUM 40 MG capsule Take 40 mg by mouth Daily.       . phenylephrine (NASAL DECONGESTANT) 1 % nasal spray Place 1 drop into the nose every 4 (four)  hours as needed. For allergies      . potassium chloride (MICRO-K) 10 MEQ CR capsule Take 1 capsule (10 mEq total) by mouth daily.  30 capsule  5  . pregabalin (LYRICA) 150 MG capsule Take 1-2 capsules (150-300 mg total) by mouth 2 (two) times daily. One in am and two and night  60 capsule  5  . testosterone (ANDROGEL) 50 MG/5GM GEL Place 5 g onto the skin daily.        Marland Kitchen torsemide (DEMADEX) 20 MG tablet Take 1-2 tablets (20-40 mg total) by mouth 2 (two) times daily. 2 tabs in the morning and 1 tab 6 hours later  90 tablet  5  . vitamin B-12 (CYANOCOBALAMIN) 100 MCG tablet Take 50 mcg by mouth daily.      Marland Kitchen zolpidem (AMBIEN) 10 MG tablet Take 1 tablet (10 mg total) by mouth at bedtime.  30 tablet  1   No current facility-administered medications for this visit.      Objective: Blood pressure 138/90, pulse 82, temperature 96.7 F (35.9 C), temperature source Oral, resp. rate 20, height 6' (1.829 m), weight 401 lb (181.892 kg). Patient is alert and does not have asterixis. Conjunctiva is pink. Sclera is nonicteric Oropharyngeal mucosa is normal. No neck masses or thyromegaly noted. Cardiac exam with regular rhythm normal S1 and S2. No murmur or gallop noted. Lungs are clear to auscultation. Abdomen is obese with umbilicus hernia which is completely reducible. Shifting dullness absent. He has easily palpable spleen. Liver edge is 7-8 cm below RCM in midclavicular line. Liver is firm and non-tender. He has dry scaly rash over both elbows and posterior aspects of forearms and 2+ pitting edema around his ankles.  Labs/studies Results: Blood work from 2012-05-31 WBC 3.8, H&H 13.3 and 38.5 and platelet count 60 K. Electrolytes within normal limits. Glucose 84, BUN 16, creatinine oh 0.87 . Serum calcium 8.9. Bilirubin 1.0, AP 64, AST 27, ALT 19, total protein 7.3, albumin 3.9    Assessment:  #1. Stage IV cirrhosis secondary to NAFLD. Presently he has preserved hepatic function however long-term prognosis is poor. In order to change his prognosis he must work harder to lose weight otherwise he will end up with one or more sequelae of cirrhosis. Patient is not a candidate for liver transplant given his morbid obesity. #2. History of melena and heme positive stool. H&H low normal. Last EGD was in January 2013. He is on Vicoprofen and therefore could have peptic ulcer disease or mucosal injury to small bowel. #3. Chronic thrombocytopenia secondary to cirrhosis and splenomegaly. His platelet count is better than it has been.    Plan:  Patient advised to keep Vico-profen use to minimum. At some point he will need to come off zolpidem. Will schedule patient for diagnostic EGD. He would also have AFP while he is at a hospital for EGD. Office visit in 6  months.

## 2012-05-27 NOTE — Patient Instructions (Signed)
EGD to be scheduled. Blood work(AFP) would be done at the time of EGD.

## 2012-06-05 ENCOUNTER — Encounter (HOSPITAL_COMMUNITY): Payer: Self-pay | Admitting: Pharmacy Technician

## 2012-06-09 ENCOUNTER — Ambulatory Visit (INDEPENDENT_AMBULATORY_CARE_PROVIDER_SITE_OTHER): Payer: Medicare Other | Admitting: Surgery

## 2012-06-10 ENCOUNTER — Other Ambulatory Visit: Payer: Self-pay

## 2012-06-10 NOTE — Telephone Encounter (Signed)
Last visit and fill 05/05/12  If approved have nurse call and notify patient

## 2012-06-11 ENCOUNTER — Encounter (HOSPITAL_COMMUNITY)
Admission: RE | Disposition: A | Discharge status: Home / Self Care | Payer: Self-pay | Source: Ambulatory Visit | Attending: Internal Medicine

## 2012-06-11 ENCOUNTER — Other Ambulatory Visit: Payer: Self-pay | Admitting: Family Medicine

## 2012-06-11 ENCOUNTER — Ambulatory Visit (HOSPITAL_COMMUNITY)
Admission: RE | Admit: 2012-06-11 | Discharge: 2012-06-11 | Disposition: A | Discharge status: Home / Self Care | Payer: Medicare Other | Source: Ambulatory Visit | Attending: Internal Medicine | Admitting: Internal Medicine

## 2012-06-11 ENCOUNTER — Encounter (HOSPITAL_COMMUNITY): Payer: Self-pay | Admitting: *Deleted

## 2012-06-11 DIAGNOSIS — J449 Chronic obstructive pulmonary disease, unspecified: Secondary | ICD-10-CM | POA: Insufficient documentation

## 2012-06-11 DIAGNOSIS — I8510 Secondary esophageal varices without bleeding: Secondary | ICD-10-CM | POA: Insufficient documentation

## 2012-06-11 DIAGNOSIS — K921 Melena: Secondary | ICD-10-CM | POA: Insufficient documentation

## 2012-06-11 DIAGNOSIS — K7460 Unspecified cirrhosis of liver: Secondary | ICD-10-CM

## 2012-06-11 DIAGNOSIS — K769 Liver disease, unspecified: Secondary | ICD-10-CM

## 2012-06-11 DIAGNOSIS — E119 Type 2 diabetes mellitus without complications: Secondary | ICD-10-CM | POA: Insufficient documentation

## 2012-06-11 DIAGNOSIS — K766 Portal hypertension: Secondary | ICD-10-CM

## 2012-06-11 DIAGNOSIS — Z8719 Personal history of other diseases of the digestive system: Secondary | ICD-10-CM

## 2012-06-11 DIAGNOSIS — K2960 Other gastritis without bleeding: Secondary | ICD-10-CM

## 2012-06-11 DIAGNOSIS — Z01812 Encounter for preprocedural laboratory examination: Secondary | ICD-10-CM | POA: Insufficient documentation

## 2012-06-11 DIAGNOSIS — R195 Other fecal abnormalities: Secondary | ICD-10-CM

## 2012-06-11 DIAGNOSIS — J4489 Other specified chronic obstructive pulmonary disease: Secondary | ICD-10-CM | POA: Insufficient documentation

## 2012-06-11 HISTORY — PX: ESOPHAGOGASTRODUODENOSCOPY: SHX5428

## 2012-06-11 LAB — HEMOGLOBIN AND HEMATOCRIT, BLOOD: Hemoglobin: 13.2 g/dL (ref 13.0–17.0)

## 2012-06-11 SURGERY — EGD (ESOPHAGOGASTRODUODENOSCOPY)
Anesthesia: Moderate Sedation

## 2012-06-11 MED ORDER — MIDAZOLAM HCL 5 MG/5ML IJ SOLN
INTRAMUSCULAR | Status: AC
  Filled 2012-06-11: qty 10

## 2012-06-11 MED ORDER — STERILE WATER FOR IRRIGATION IR SOLN
Status: DC | PRN
  Administered 2012-06-11: 12:00:00

## 2012-06-11 MED ORDER — PROMETHAZINE HCL 25 MG/ML IJ SOLN
INTRAMUSCULAR | Status: DC | PRN
  Administered 2012-06-11: 12.5 mg via INTRAVENOUS

## 2012-06-11 MED ORDER — MIDAZOLAM HCL 5 MG/5ML IJ SOLN
INTRAMUSCULAR | Status: AC
  Filled 2012-06-11: qty 5

## 2012-06-11 MED ORDER — MEPERIDINE HCL 50 MG/ML IJ SOLN
INTRAMUSCULAR | Status: AC
  Filled 2012-06-11: qty 1

## 2012-06-11 MED ORDER — MEPERIDINE HCL 25 MG/ML IJ SOLN
INTRAMUSCULAR | Status: DC | PRN
  Administered 2012-06-11 (×4): 25 mg via INTRAVENOUS

## 2012-06-11 MED ORDER — SODIUM CHLORIDE 0.9 % IJ SOLN
INTRAMUSCULAR | Status: AC
  Filled 2012-06-11: qty 10

## 2012-06-11 MED ORDER — MIDAZOLAM HCL 5 MG/5ML IJ SOLN
INTRAMUSCULAR | Status: DC | PRN
  Administered 2012-06-11 (×4): 3 mg via INTRAVENOUS

## 2012-06-11 MED ORDER — BUTAMBEN-TETRACAINE-BENZOCAINE 2-2-14 % EX AERO
INHALATION_SPRAY | CUTANEOUS | Status: DC | PRN
  Administered 2012-06-11: 2 via TOPICAL

## 2012-06-11 MED ORDER — ALPRAZOLAM 0.5 MG PO TABS: 0.5000 mg | ORAL_TABLET | Freq: Every evening | ORAL | Status: DC | PRN

## 2012-06-11 MED ORDER — SODIUM CHLORIDE 0.9 % IV SOLN
INTRAVENOUS | Status: DC
  Administered 2012-06-11: 11:00:00 via INTRAVENOUS

## 2012-06-11 MED ORDER — PROMETHAZINE HCL 25 MG/ML IJ SOLN
INTRAMUSCULAR | Status: AC
  Filled 2012-06-11: qty 1

## 2012-06-11 NOTE — H&P (Signed)
[name] is an 47 y.o. male.   Chief Complaint: Patient's here for EGD. HPI: Patient is 47 year old Caucasian male with biopsy proven cirrhosis secondary to NAFLD who presents with history of melena a few weeks ago. His stools have returned to normal. He denies nausea vomiting or abdominal pain. Last EGD was in [place] 2013 revealing grade 1 esophageal varices and portal gastropathy. Patient has been on Vicoprofen when necessary  Past Medical History  Diagnosis Date  . Gout   . Diabetes mellitus     type 2  . RA (rheumatoid arthritis)   . COPD (chronic obstructive pulmonary disease)     with cpap  . Atrial fibrillation   . Cirrhosis   . Anxiety   . Heart murmur   . CHF (congestive heart failure)   . Constipation   . Sleep apnea     cpap used- level 10 and greater  . Neuromuscular disorder     neuropathy in hands and feet  . Cirrhosis     NASH  . Thrombocytopenia     Past Surgical History  Procedure Laterality Date  . Sciatic nerve exploration    . Tonsillectomy    . Esophagogastroduodenoscopy  02/28/2011    Procedure: ESOPHAGOGASTRODUODENOSCOPY (EGD);  Surgeon: Rogene Houston, MD;  Location: [facility];  Service: Endoscopy;  Laterality: N/A;  1200  . Colonoscopy  05/08/2011    Procedure: COLONOSCOPY;  Surgeon: Rogene Houston, MD;  Location: [facility];  Service: Endoscopy;  Laterality: N/A;  730  . Hernia repair      as child  . Liver biopsy  2012  . Ankle surgery      right ankle talor repair    Family History  Problem Relation Age of Onset  . Colon cancer Mother   . Cancer Mother     intestine  . Cancer Father     throat  . Cancer Brother     brain  . Cancer Sister    Social History:  reports that he has never smoked. His smokeless tobacco use includes Snuff and Chew. He reports that he drinks about 0.6 ounces of alcohol per week. He reports that he does not use illicit drugs.  Allergies:  Allergies  Allergen Reactions  . Oxycodone Base  Itching and Nausea And Vomiting  . Prozac (Fluoxetine Hcl) Itching    Medications Prior to Admission  Medication Sig Dispense Refill  . albuterol (PROVENTIL HFA;VENTOLIN HFA) 108 (90 BASE) MCG/ACT inhaler Inhale 2 puffs into the lungs every 6 (six) hours as needed. For shortness of breath      . allopurinol (ZYLOPRIM) 100 MG tablet Take 2 tablets (200 mg total) by mouth daily.  60 tablet  6  . COLCRYS 0.6 MG tablet Take 0.6 mg by mouth as needed. For gout      . diltiazem (CARDIZEM) 90 MG tablet Take 90 mg by mouth 3 (three) times daily.       Marland Kitchen ezetimibe (ZETIA) 10 MG tablet Take 1 tablet (10 mg total) by mouth daily.  30 tablet  5  . fluticasone (FLONASE) 50 MCG/ACT nasal spray Place 2 sprays into both nostrils daily as needed. For allergies      . folic acid (FOLVITE) 093 MCG tablet Take 400 mcg by mouth daily.      Marland Kitchen HYDROcodone-ibuprofen (VICOPROFEN) 7.5-200 MG per tablet Take 1 tablet by mouth every 6 (six) hours as needed. For pain  120 tablet  0  . hydrocortisone 1 %  ointment Apply 1 application topically 3 (three) times daily as needed. Patient states that this is a 2% Oint.      . lidocaine (XYLOCAINE) 5 % ointment Apply 1 application topically 4 (four) times daily as needed. To knees and ankle as needed for pain      . Menthol-Methyl Salicylate (MUSCLE RUB) 10-15 % CREA Apply 1 application topically as needed. For muscle pain      . milk thistle 175 MG tablet Take 175 mg by mouth 4 (four) times daily.       . Multiple Vitamin (MULITIVITAMIN WITH MINERALS) TABS Take 1 tablet by mouth daily.      Marland Kitchen neomycin-bacitracin-polymyxin (NEOSPORIN) ointment Apply 1 application topically every 12 (twelve) hours as needed. For cut on ankle      . NEXIUM 40 MG capsule Take 40 mg by mouth Daily.       . phenylephrine (NASAL DECONGESTANT) 1 % nasal spray Place 1 drop into the nose every 4 (four) hours as needed. For allergies      . potassium chloride (MICRO-K) 10 MEQ CR capsule Take 1 capsule (10  mEq total) by mouth daily.  30 capsule  5  . pregabalin (LYRICA) 150 MG capsule Take 1-2 capsules (150-300 mg total) by mouth 2 (two) times daily. One in am and two and night  60 capsule  5  . testosterone (ANDROGEL) 50 MG/5GM GEL Place 5 g onto the skin daily.        Marland Kitchen torsemide (DEMADEX) 20 MG tablet Take 1-2 tablets (20-40 mg total) by mouth 2 (two) times daily. 2 tabs in the morning and 1 tab 6 hours later  90 tablet  5  . vitamin B-12 (CYANOCOBALAMIN) 100 MCG tablet Take 50 mcg by mouth daily.      Marland Kitchen zolpidem (AMBIEN) 10 MG tablet Take 1 tablet (10 mg total) by mouth at bedtime.  30 tablet  1  . aspirin EC 81 MG tablet Take 81 mg by mouth every 30 (thirty) days.      Marland Kitchen ipratropium (ATROVENT) 0.02 % nebulizer solution Take 500 mcg by nebulization 4 (four) times daily as needed. For wheezing        No results found for this or any previous visit (from the past 48 hour(s)). No results found.  ROS  Blood pressure 148/88, temperature 98 F (36.7 C), temperature source Oral, resp. rate 20, height 6' (1.829 m), weight 390 lb (176.903 kg), SpO2 98.00%. Physical Exam  Constitutional:  Pleasant obese Caucasian male in NAD  HENT:  Mouth/Throat: Oropharynx is clear and moist.  Eyes: Conjunctivae are normal. No scleral icterus.  Neck: No thyromegaly present.  Cardiovascular: Normal rate, regular rhythm and normal heart sounds.   No murmur heard. Respiratory: Effort normal and breath sounds normal.  GI:  Obese with small umbilicus hernia.  Hepatosplenomegaly. Abdomen is nontender  Musculoskeletal: Edema: 2+ LE edema.  Lymphadenopathy:    He has no cervical adenopathy.  Neurological: He is alert.  Skin: Skin is warm and dry.     Assessment/Plan History of melena in a patient with cirrhosis. NSAID use. Esophagogastroduodenoscopy. The patient has large esophageal varices, these will be banded.  Naziah Portee U 06/11/2012, 12:00 PM

## 2012-06-11 NOTE — Op Note (Signed)
EGD PROCEDURE REPORT  PATIENT:  [name]  MR#:  [id number] Birthdate:  08-23-1965, 47 y.o., male Endoscopist:  Dr. Rogene Houston, MD Referred By:  Dr. Redge Gainer, MD  Procedure Date: 06/11/2012  Procedure:   EGD  Indications:  Patient is 46 years old 25 male who presents with history of melena. He has stage IV Fibrosis/cirrhosis secondary to NAFLD. His last EGD was in February 2013.            Informed Consent:  The risks, benefits, alternatives & imponderables which include, but are not limited to, bleeding, infection, perforation, drug reaction and potential missed lesion have been reviewed.  The potential for biopsy, lesion removal, esophageal dilation, etc. have also been discussed.  Questions have been answered.  All parties agreeable.  Please see history & physical in medical record for more information.  Medications:  Demerol 100 mg IV Versed 12 mg IV Promethazine 25 mg IV and diluted form. Cetacaine spray topically for oropharyngeal anesthesia  Description of procedure:  The endoscope was introduced through the mouth and advanced to the second portion of the duodenum without difficulty or limitations. The mucosal surfaces were surveyed very carefully during advancement of the scope and upon withdrawal.  Findings:  Esophagus:  Mucosa of the proximal and middle third was normal too short columns of grade 1 esophageal varices noted. No change since last exam of February 2013. GEJ:  43 cm Stomach:  Stomach was empty and distended very well with insufflation. Folds in the proximal stomach are normal. Examination mucosa revealed mosaic pattern and red spots. Multiple small nodules with erosion noted at antrum. These lesions were biopsied on loss EGD and revealed changes of gastritis and portal gastropathy. Pyloric channel was patent. Angularis fundus and cardia were examined by retroflex the scope and were normal. Duodenum:  Normal bulbar and post bulbar  mucosa.  Therapeutic/Diagnostic Maneuvers Performed:  None  Complications:  None  Impression: 2 short columns of grade 1 esophageal varices. Portal gastropathy with nodular antral mucosa with erosions. No evidence of peptic ulcer disease or fungal varices. Please note antral mucosa was biopsied in [place] 2013 revealing changes of gastritis portal gastropathy and H. pylori stains were negative.  Recommendations:  Will check H&H and AFP today. Once again patient asked to keep Vicoprofen use the minimum. He will call if he has further episodes of melena.   Ann Groeneveld U  06/11/2012  12:41 PM  CC: Dr. Redge Gainer, MD & Dr. Rayne Du ref. provider found

## 2012-06-13 ENCOUNTER — Encounter (HOSPITAL_COMMUNITY): Payer: Self-pay | Admitting: Internal Medicine

## 2012-06-25 ENCOUNTER — Telehealth (INDEPENDENT_AMBULATORY_CARE_PROVIDER_SITE_OTHER): Payer: Self-pay | Admitting: *Deleted

## 2012-06-25 NOTE — Telephone Encounter (Signed)
[name] said Dr. Laural Golden left a message yesterday with his lab results and he didn't understand what he said.  Would like to see if someone would please return his call at [phone].

## 2012-06-25 NOTE — Telephone Encounter (Signed)
Results given to patient

## 2012-06-26 ENCOUNTER — Telehealth: Payer: Self-pay | Admitting: Family Medicine

## 2012-06-26 NOTE — Telephone Encounter (Signed)
He needs to limit use of the xanax. He has cirrhosis and sleep apnea and at risk for an adverse event. I will not Rx more than 30. He will need to see a psychiatrist at [facility].. Please give him the number to call [facility] for an appointment.

## 2012-06-27 ENCOUNTER — Telehealth: Payer: Self-pay | Admitting: Family Medicine

## 2012-06-27 NOTE — Telephone Encounter (Signed)
Left message for pt to return call.

## 2012-06-30 ENCOUNTER — Other Ambulatory Visit: Payer: Self-pay | Admitting: *Deleted

## 2012-06-30 MED ORDER — ALPRAZOLAM 0.5 MG PO TABS: 0.5000 mg | ORAL_TABLET | Freq: Every evening | ORAL | Status: DC | PRN

## 2012-06-30 NOTE — Telephone Encounter (Signed)
Please call in rx for xanax with 1 refill

## 2012-06-30 NOTE — Telephone Encounter (Signed)
LAST RF 06/10/12. CALL IN THE DRUG STORE. LAST OV 4/14.

## 2012-07-01 ENCOUNTER — Ambulatory Visit (INDEPENDENT_AMBULATORY_CARE_PROVIDER_SITE_OTHER): Payer: Medicare Other | Admitting: Surgery

## 2012-07-01 NOTE — Telephone Encounter (Signed)
PT NOTIFIED AND AWARE THAT DR Jacelyn Grip WILL ONLY GIVE 30 OF [name]

## 2012-07-01 NOTE — Telephone Encounter (Signed)
Med called in

## 2012-07-14 ENCOUNTER — Ambulatory Visit: Payer: Medicare Other | Admitting: Adult Health

## 2012-07-16 ENCOUNTER — Ambulatory Visit: Payer: Medicare Other | Admitting: Adult Health

## 2012-07-21 ENCOUNTER — Ambulatory Visit (INDEPENDENT_AMBULATORY_CARE_PROVIDER_SITE_OTHER): Payer: Medicare Other | Admitting: Surgery

## 2012-07-21 ENCOUNTER — Telehealth: Payer: Self-pay

## 2012-07-21 NOTE — Telephone Encounter (Signed)
Patient wants a Dr. In [place] for hernia   [place]. Too far to go  Please put referral in workqueue

## 2012-07-22 ENCOUNTER — Other Ambulatory Visit: Payer: Self-pay | Admitting: Family Medicine

## 2012-07-22 DIAGNOSIS — K429 Umbilical hernia without obstruction or gangrene: Secondary | ICD-10-CM

## 2012-07-25 DIAGNOSIS — R079 Chest pain, unspecified: Secondary | ICD-10-CM

## 2012-07-30 ENCOUNTER — Ambulatory Visit: Payer: Medicare Other | Admitting: Adult Health

## 2012-07-31 ENCOUNTER — Other Ambulatory Visit: Payer: Self-pay

## 2012-07-31 MED ORDER — DILTIAZEM HCL 90 MG PO TABS: 90.0000 mg | ORAL_TABLET | Freq: Three times a day (TID) | ORAL | Status: DC

## 2012-07-31 MED ORDER — ZOLPIDEM TARTRATE 10 MG PO TABS: 10.0000 mg | ORAL_TABLET | Freq: Every day | ORAL | Status: DC

## 2012-07-31 NOTE — Telephone Encounter (Signed)
rx called to the drug store spoke with [facility]

## 2012-07-31 NOTE — Telephone Encounter (Signed)
Called rx in

## 2012-07-31 NOTE — Telephone Encounter (Signed)
Last seen 05/05/12  Last filled 05/05/12  [facility]   If approved route to nurse to phone in

## 2012-08-05 ENCOUNTER — Ambulatory Visit: Payer: Medicare Other | Admitting: Family Medicine

## 2012-08-07 ENCOUNTER — Ambulatory Visit: Payer: Medicare Other | Admitting: Family Medicine

## 2012-08-08 ENCOUNTER — Telehealth: Payer: Self-pay | Admitting: Cardiology

## 2012-08-08 NOTE — Telephone Encounter (Signed)
New problem    Pt has gained 10.6 lbs in past 4 days

## 2012-08-08 NOTE — Telephone Encounter (Signed)
CHF nurse called regarding pt. [name] states pt was given a scale to weigh every day. CHF nurse [name] called pt to check on pt , and he said that has gained 10.6 lbs in the last 4 days, [name] states she was not able to talk with pt in details, because he was busy. I called pt and left a message to call back.

## 2012-08-12 ENCOUNTER — Other Ambulatory Visit: Payer: Self-pay | Admitting: Family Medicine

## 2012-08-12 ENCOUNTER — Ambulatory Visit: Payer: Medicare Other | Admitting: Adult Health

## 2012-08-12 NOTE — Telephone Encounter (Signed)
Patient last seen in office on 05-05-12. Rx for hydrocodone last filled on 06-09-12 for #120. Alprazolam was last filled o 07-01-12 for #60. Please advise. If approved please print hydrocodone and route to pool A so pt can be contacted to come pick up rx and alprazolam can be phoned in    Jacelyn Grip)

## 2012-08-18 ENCOUNTER — Telehealth: Payer: Self-pay | Admitting: Family Medicine

## 2012-08-19 ENCOUNTER — Encounter: Payer: Self-pay | Admitting: Family Medicine

## 2012-08-19 ENCOUNTER — Ambulatory Visit (INDEPENDENT_AMBULATORY_CARE_PROVIDER_SITE_OTHER): Payer: Medicare Other | Admitting: Family Medicine

## 2012-08-19 VITALS — BP 133/81 | HR 84 | Temp 97.4°F | Wt >= 6400 oz

## 2012-08-19 DIAGNOSIS — G4700 Insomnia, unspecified: Secondary | ICD-10-CM

## 2012-08-19 DIAGNOSIS — K7460 Unspecified cirrhosis of liver: Secondary | ICD-10-CM

## 2012-08-19 DIAGNOSIS — J4520 Mild intermittent asthma, uncomplicated: Secondary | ICD-10-CM

## 2012-08-19 DIAGNOSIS — I2510 Atherosclerotic heart disease of native coronary artery without angina pectoris: Secondary | ICD-10-CM

## 2012-08-19 DIAGNOSIS — G4733 Obstructive sleep apnea (adult) (pediatric): Secondary | ICD-10-CM

## 2012-08-19 DIAGNOSIS — J45909 Unspecified asthma, uncomplicated: Secondary | ICD-10-CM

## 2012-08-19 DIAGNOSIS — D696 Thrombocytopenia, unspecified: Secondary | ICD-10-CM

## 2012-08-19 DIAGNOSIS — E291 Testicular hypofunction: Secondary | ICD-10-CM

## 2012-08-19 DIAGNOSIS — M109 Gout, unspecified: Secondary | ICD-10-CM

## 2012-08-19 DIAGNOSIS — M79609 Pain in unspecified limb: Secondary | ICD-10-CM

## 2012-08-19 DIAGNOSIS — I5032 Chronic diastolic (congestive) heart failure: Secondary | ICD-10-CM

## 2012-08-19 DIAGNOSIS — I2789 Other specified pulmonary heart diseases: Secondary | ICD-10-CM

## 2012-08-19 DIAGNOSIS — E119 Type 2 diabetes mellitus without complications: Secondary | ICD-10-CM

## 2012-08-19 DIAGNOSIS — I4891 Unspecified atrial fibrillation: Secondary | ICD-10-CM

## 2012-08-19 LAB — POCT GLYCOSYLATED HEMOGLOBIN (HGB A1C): Hemoglobin A1C: 5.4

## 2012-08-19 MED ORDER — EZETIMIBE 10 MG PO TABS: 10.0000 mg | ORAL_TABLET | Freq: Every day | ORAL | Status: DC

## 2012-08-19 MED ORDER — DOXEPIN HCL 3 MG PO TABS: 3.0000 mg | ORAL_TABLET | Freq: Every day | ORAL | Status: DC

## 2012-08-19 MED ORDER — DILTIAZEM HCL 90 MG PO TABS: 90.0000 mg | ORAL_TABLET | Freq: Three times a day (TID) | ORAL | Status: DC

## 2012-08-19 MED ORDER — POTASSIUM CHLORIDE ER 10 MEQ PO CPCR: 10.0000 meq | ORAL_CAPSULE | Freq: Every day | ORAL | Status: DC

## 2012-08-19 MED ORDER — TORSEMIDE 20 MG PO TABS: 20.0000 mg | ORAL_TABLET | Freq: Two times a day (BID) | ORAL | Status: DC

## 2012-08-19 MED ORDER — PREGABALIN 300 MG PO CAPS: 300.0000 mg | ORAL_CAPSULE | Freq: Two times a day (BID) | ORAL | Status: DC

## 2012-08-19 MED ORDER — ALLOPURINOL 300 MG PO TABS: 300.0000 mg | ORAL_TABLET | Freq: Every day | ORAL | Status: DC

## 2012-08-19 MED ORDER — HYDROCODONE-IBUPROFEN 7.5-200 MG PO TABS: 1.0000 | ORAL_TABLET | Freq: Four times a day (QID) | ORAL | Status: DC | PRN

## 2012-08-19 NOTE — Progress Notes (Signed)
Patient ID: [name], male   DOB: Mar 17, 1965, 47 y.o.   MRN: [id number] SUBJECTIVE: CC: Chief Complaint  Patient presents with  . Follow-up    3 month c/po fingers get stuck and legs hurt  . Medication Refill    wants refill     HPI: Patient is here for follow up of Diabetes Mellitus/morboid obesity and multiple medical problems: Symptoms evaluated: Denies Nocturia ,Denies Urinary Frequency , denies Blurred vision ,deniesDizziness,denies.Dysuria, Paresthesias of arms, extremity pain finger hurt to straighten and his knees hurt no ulcers.Marland Kitchendenies chest pain. has had an annual eye exam. do check the feet. Does check CBGs. Average CBG: Denies episodes of hypoglycemia. Does have an emergency hypoglycemic plan. admits toCompliance with medications. Denies Problems with medications. Does think the lyrica works  Breakfast: 1 egg and 1 serving of oatmeal Mid morning snack : tomato sandwich Lunch: a cucumber or tomato and watermelon Mid-afternoon: cucumber or salad or tomato sandwich Supper:salad  [facility] A [facility] training. 1 - x per week.  Past Medical History  Diagnosis Date  . Gout   . Diabetes mellitus     type 2  . RA (rheumatoid arthritis)   . COPD (chronic obstructive pulmonary disease)     with cpap  . Atrial fibrillation   . Cirrhosis   . Anxiety   . Heart murmur   . CHF (congestive heart failure)   . Constipation   . Sleep apnea     cpap used- level 10 and greater  . Neuromuscular disorder     neuropathy in hands and feet  . Cirrhosis     NASH  . Thrombocytopenia    Past Surgical History  Procedure Laterality Date  . Sciatic nerve exploration    . Tonsillectomy    . Esophagogastroduodenoscopy  02/28/2011    Procedure: ESOPHAGOGASTRODUODENOSCOPY (EGD);  Surgeon: Rogene Houston, MD;  [facility];  Service: Endoscopy;  Laterality: N/A;  1200  . Colonoscopy  05/08/2011    Procedure: COLONOSCOPY;  Surgeon:  Rogene Houston, MD;  [facility]: [facility];  Service: Endoscopy;  Laterality: N/A;  730  . Hernia repair      as child  . Liver biopsy  2012  . Ankle surgery      right ankle talor repair  . Esophagogastroduodenoscopy N/A 06/11/2012    Procedure: ESOPHAGOGASTRODUODENOSCOPY (EGD);  Surgeon: Rogene Houston, MD;  [facility]: [facility];  Service: Endoscopy;  Laterality: N/A;  1200  FYI patient is 400 pounds   History   Social History  . Marital Status: Divorced    Spouse Name: N/A    Number of Children: N/A  . Years of Education: N/A   Occupational History  . Not on file.   Social History Main Topics  . Smoking status: Never Smoker   . Smokeless tobacco: Current User    Types: Snuff, Chew     Comment: Pt reports that he "dips"  . Alcohol Use: 0.6 oz/week    1 Cans of beer per week  . Drug Use: No     Comment: Use to smoke cocaine and marijuana. No IV drug use  . Sexually Active: Yes   Other Topics Concern  . Not on file   Social History Narrative  . No narrative on file   Family History  Problem Relation Age of Onset  . Colon cancer Mother   . Cancer Mother     intestine  . Cancer Father  throat  . Cancer Brother     brain  . Cancer Sister    Current Outpatient Prescriptions on File Prior to Visit  Medication Sig Dispense Refill  . albuterol (PROVENTIL HFA;VENTOLIN HFA) 108 (90 BASE) MCG/ACT inhaler Inhale 2 puffs into the lungs every 6 (six) hours as needed. For shortness of breath      . allopurinol (ZYLOPRIM) 100 MG tablet Take 2 tablets (200 mg total) by mouth daily.  60 tablet  6  . ALPRAZolam (XANAX) 0.5 MG tablet Take 1-3 tablets (0.5-1.5 mg total) by mouth at bedtime as needed for anxiety. For anxiety  60 tablet  1  . aspirin EC 81 MG tablet Take 81 mg by mouth every 30 (thirty) days.      . COLCRYS 0.6 MG tablet Take 0.6 mg by mouth as needed. For gout      . diltiazem (CARDIZEM) 90 MG tablet Take 1 tablet (90 mg total) by mouth 3 (three) times  daily.  90 tablet  2  . ezetimibe (ZETIA) 10 MG tablet Take 1 tablet (10 mg total) by mouth daily.  30 tablet  5  . fluticasone (FLONASE) 50 MCG/ACT nasal spray Place 2 sprays into both nostrils daily as needed. For allergies      . folic acid (FOLVITE) 809 MCG tablet Take 400 mcg by mouth daily.      Marland Kitchen HYDROcodone-ibuprofen (VICOPROFEN) 7.5-200 MG per tablet Take 1 tablet by mouth every 6 (six) hours as needed. For pain  120 tablet  0  . hydrocortisone 1 % ointment Apply 1 application topically 3 (three) times daily as needed. Patient states that this is a 2% Oint.      Marland Kitchen ipratropium (ATROVENT) 0.02 % nebulizer solution Take 500 mcg by nebulization 4 (four) times daily as needed. For wheezing      . lidocaine (XYLOCAINE) 5 % ointment Apply 1 application topically 4 (four) times daily as needed. To knees and ankle as needed for pain      . Menthol-Methyl Salicylate (MUSCLE RUB) 10-15 % CREA Apply 1 application topically as needed. For muscle pain      . milk thistle 175 MG tablet Take 175 mg by mouth 4 (four) times daily.       . Multiple Vitamin (MULITIVITAMIN WITH MINERALS) TABS Take 1 tablet by mouth daily.      Marland Kitchen neomycin-bacitracin-polymyxin (NEOSPORIN) ointment Apply 1 application topically every 12 (twelve) hours as needed. For cut on ankle      . NEXIUM 40 MG capsule Take 40 mg by mouth Daily.       . phenylephrine (NASAL DECONGESTANT) 1 % nasal spray Place 1 drop into the nose every 4 (four) hours as needed. For allergies      . potassium chloride (MICRO-K) 10 MEQ CR capsule Take 1 capsule (10 mEq total) by mouth daily.  30 capsule  5  . pregabalin (LYRICA) 150 MG capsule Take 1-2 capsules (150-300 mg total) by mouth 2 (two) times daily. One in am and two and night  60 capsule  5  . testosterone (ANDROGEL) 50 MG/5GM GEL Place 5 g onto the skin daily.        Marland Kitchen torsemide (DEMADEX) 20 MG tablet Take 1-2 tablets (20-40 mg total) by mouth 2 (two) times daily. 2 tabs in the morning and 1 tab 6  hours later  90 tablet  5  . vitamin B-12 (CYANOCOBALAMIN) 100 MCG tablet Take 50 mcg by mouth daily.      Marland Kitchen  zolpidem (AMBIEN) 10 MG tablet Take 1 tablet (10 mg total) by mouth at bedtime.  30 tablet  0   No current facility-administered medications on file prior to visit.   Allergies  Allergen Reactions  . Oxycodone Base Itching and Nausea And Vomiting  . Prozac (Fluoxetine Hcl) Itching   Immunization History  Administered Date(s) Administered  . Influenza Whole 10/11/2008, 01/09/2010, 11/09/2011  . Pneumococcal Polysaccharide 09/08/2008  . Td 01/09/2008   Prior to Admission medications   Medication Sig Start Date End Date Taking? Authorizing Provider  albuterol (PROVENTIL HFA;VENTOLIN HFA) 108 (90 BASE) MCG/ACT inhaler Inhale 2 puffs into the lungs every 6 (six) hours as needed. For shortness of breath   Yes Historical Provider, MD  allopurinol (ZYLOPRIM) 100 MG tablet Take 2 tablets (200 mg total) by mouth daily. 05/06/12  Yes Vernie Shanks, MD  ALPRAZolam Duanne Moron) 0.5 MG tablet Take 1-3 tablets (0.5-1.5 mg total) by mouth at bedtime as needed for anxiety. For anxiety 06/30/12  Yes Mary-[name] Hassell Done, FNP  aspirin EC 81 MG tablet Take 81 mg by mouth every 30 (thirty) days.   Yes Historical Provider, MD  COLCRYS 0.6 MG tablet Take 0.6 mg by mouth as needed. For gout 12/24/11  Yes Historical Provider, MD  diltiazem (CARDIZEM) 90 MG tablet Take 1 tablet (90 mg total) by mouth 3 (three) times daily. 07/31/12  Yes Vernie Shanks, MD  ezetimibe (ZETIA) 10 MG tablet Take 1 tablet (10 mg total) by mouth daily. 05/05/12  Yes Vernie Shanks, MD  fluticasone (FLONASE) 50 MCG/ACT nasal spray Place 2 sprays into both nostrils daily as needed. For allergies 05/23/11  Yes Historical Provider, MD  folic acid (FOLVITE) 412 MCG tablet Take 400 mcg by mouth daily.   Yes Historical Provider, MD  HYDROcodone-ibuprofen (VICOPROFEN) 7.5-200 MG per tablet Take 1 tablet by mouth every 6 (six) hours as needed. For  pain 05/05/12  Yes Vernie Shanks, MD  hydrocortisone 1 % ointment Apply 1 application topically 3 (three) times daily as needed. Patient states that this is a 2% Oint.   Yes Historical Provider, MD  ipratropium (ATROVENT) 0.02 % nebulizer solution Take 500 mcg by nebulization 4 (four) times daily as needed. For wheezing   Yes Historical Provider, MD  lidocaine (XYLOCAINE) 5 % ointment Apply 1 application topically 4 (four) times daily as needed. To knees and ankle as needed for pain 08/08/11  Yes Historical Provider, MD  Menthol-Methyl Salicylate (MUSCLE RUB) 10-15 % CREA Apply 1 application topically as needed. For muscle pain   Yes Historical Provider, MD  milk thistle 175 MG tablet Take 175 mg by mouth 4 (four) times daily.    Yes Historical Provider, MD  Multiple Vitamin (MULITIVITAMIN WITH MINERALS) TABS Take 1 tablet by mouth daily.   Yes Historical Provider, MD  neomycin-bacitracin-polymyxin (NEOSPORIN) ointment Apply 1 application topically every 12 (twelve) hours as needed. For cut on ankle   Yes Historical Provider, MD  NEXIUM 40 MG capsule Take 40 mg by mouth Daily.  11/29/11  Yes Historical Provider, MD  phenylephrine (NASAL DECONGESTANT) 1 % nasal spray Place 1 drop into the nose every 4 (four) hours as needed. For allergies   Yes Historical Provider, MD  potassium chloride (MICRO-K) 10 MEQ CR capsule Take 1 capsule (10 mEq total) by mouth daily. 05/05/12  Yes Vernie Shanks, MD  pregabalin (LYRICA) 150 MG capsule Take 1-2 capsules (150-300 mg total) by mouth 2 (two) times daily. One in am and two  and night 05/05/12  Yes Vernie Shanks, MD  testosterone (ANDROGEL) 50 MG/5GM GEL Place 5 g onto the skin daily.     Yes Historical Provider, MD  torsemide (DEMADEX) 20 MG tablet Take 1-2 tablets (20-40 mg total) by mouth 2 (two) times daily. 2 tabs in the morning and 1 tab 6 hours later 05/05/12  Yes Vernie Shanks, MD  vitamin B-12 (CYANOCOBALAMIN) 100 MCG tablet Take 50 mcg by mouth daily.   Yes  Historical Provider, MD  zolpidem (AMBIEN) 10 MG tablet Take 1 tablet (10 mg total) by mouth at bedtime. 07/31/12  Yes Vernie Shanks, MD      ROS: As above in the HPI. All other systems are stable or negative.  OBJECTIVE: APPEARANCE:  Patient in no acute distress.The patient appeared well nourished and normally developed. Acyanotic. Waist: VITAL SIGNS:BP 133/81  Pulse 84  Temp(Src) 97.4 F (36.3 C) (Oral)  Wt 425 lb (192.779 kg)  BMI 57.63 kg/m2  morbidly Obese WM  SKIN: warm and  Dry without overt rashes, tattoos and scars  HEAD and Neck: without JVD, Head and scalp: normal Eyes:No scleral icterus. Fundi normal, eye movements normal. Ears: Auricle normal, canal normal, Tympanic membranes normal, insufflation normal. Nose: normal Throat: normal Neck & thyroid: normal  CHEST & LUNGS: Chest wall: normal Lungs: Clear  CVS: Reveals the PMI to be normally located. Regular rhythm, First and Second Heart sounds are normal,  absence of murmurs, rubs or gallops. Peripheral vasculature: Radial pulses: normal Dorsal pedis pulses:unable to palpate due to obesity Posterior pulses: unable to palpate ABDOMEN:  Appearance:morbidly obese Benign, no organomegaly, no masses, no Abdominal Aortic enlargement. No Guarding , no rebound. No Bruits. Bowel sounds: normal  RECTAL: N/A GU: N/A  EXTREMETIES: trace edematous.  MUSCULOSKELETAL:  Spine: reduced ROM Joints: reduced ROM  NEUROLOGIC: oriented to time,place and person; nonfocal neurologically  Results for orders placed in visit on 08/19/12  POCT GLYCOSYLATED HEMOGLOBIN (HGB A1C)      Result Value Range   Hemoglobin A1C 5.4%      ASSESSMENT: Asthma, mild intermittent, uncomplicated  Atrial fibrillation - Plan: diltiazem (CARDIZEM) 90 MG tablet, potassium chloride (MICRO-K) 10 MEQ CR capsule, CMP14+EGFR  CAD - Plan: diltiazem (CARDIZEM) 90 MG tablet, ezetimibe (ZETIA) 10 MG tablet  Cirrhosis  DIABETES MELLITUS -  Plan: NMR, lipoprofile, POCT glycosylated hemoglobin (Hb A1C), CANCELED: POCT UA - Microalbumin  DIASTOLIC HEART FAILURE, CHRONIC - Plan: torsemide (DEMADEX) 20 MG tablet, Brain natriuretic peptide  GOUT - Plan: allopurinol (ZYLOPRIM) 300 MG tablet, NMR, lipoprofile, Uric acid  HYPERTENSION, PULMONARY - Plan: diltiazem (CARDIZEM) 90 MG tablet, potassium chloride (MICRO-K) 10 MEQ CR capsule, NMR, lipoprofile  Hypogonadism male - Plan: Testosterone,Free and Total  LEG PAIN - Plan: pregabalin (LYRICA) 300 MG capsule, HYDROcodone-ibuprofen (VICOPROFEN) 7.5-200 MG per tablet  OBESITY, MORBID  SLEEP APNEA, OBSTRUCTIVE  THROMBOCYTOPENIA - Plan: CBC With differential/Platelet  Insomnia - Plan: Doxepin HCl 3 MG TABS  PLAN:  Orders Placed This Encounter  Procedures  . CBC With differential/Platelet  . CMP14+EGFR  . NMR, lipoprofile  . Testosterone,Free and Total  . Uric acid  . Brain natriuretic peptide  . POCT glycosylated hemoglobin (Hb A1C)    Meds ordered this encounter  Medications  . diltiazem (CARDIZEM) 90 MG tablet    Sig: Take 1 tablet (90 mg total) by mouth 3 (three) times daily.    Dispense:  90 tablet    Refill:  2  . ezetimibe (ZETIA) 10 MG tablet  Sig: Take 1 tablet (10 mg total) by mouth daily.    Dispense:  30 tablet    Refill:  5  . potassium chloride (MICRO-K) 10 MEQ CR capsule    Sig: Take 1 capsule (10 mEq total) by mouth daily.    Dispense:  30 capsule    Refill:  5  . pregabalin (LYRICA) 300 MG capsule    Sig: Take 1 capsule (300 mg total) by mouth 2 (two) times daily. One in am and two and night    Dispense:  60 capsule    Refill:  5  . torsemide (DEMADEX) 20 MG tablet    Sig: Take 1-2 tablets (20-40 mg total) by mouth 2 (two) times daily. 2 tabs in the morning and 1 tab 6 hours later    Dispense:  90 tablet    Refill:  5  . allopurinol (ZYLOPRIM) 300 MG tablet    Sig: Take 1 tablet (300 mg total) by mouth daily.    Dispense:  30 tablet     Refill:  6  . Doxepin HCl 3 MG TABS    Sig: Take 1 tablet (3 mg total) by mouth at bedtime.    Dispense:  30 tablet    Refill:  2  . HYDROcodone-ibuprofen (VICOPROFEN) 7.5-200 MG per tablet    Sig: Take 1 tablet by mouth every 6 (six) hours as needed. For pain    Dispense:  120 tablet    Refill:  0   Explained to patient the need to make gradual changes. Start on water aerobics. Start on exercises in ta chair.  Dietary changes needed.  Handouts on Obesity and DM given in the AVS.       Dr Paula Libra Recommendations  Diet and Exercise discussed with patient.  For nutrition information, I recommend books:  1).Eat to Live by Dr Excell Seltzer. 2).Prevent and Reverse Heart Disease by Dr Karl Luke. 3) Dr Janene Harvey Book:  Program to Reverse Diabetes  Exercise recommendations are:  If unable to walk, then the patient can exercise in a chair 3 times a day. By flapping arms like a bird gently and raising legs outwards to the front.  If ambulatory, the patient can go for walks for 30 minutes 3 times a week. Then increase the intensity and duration as tolerated.  Goal is to try to attain exercise frequency to 5 times a week.  If applicable: Best to perform resistance exercises (machines or weights) 2 days a week and cardio type exercises 3 days per week.  Doubt I will be successful in this patient who has poor motivation and poor interest for improvement. Longterm prognosis is extremely poor.  Return in about 3 months (around 11/19/2012) for Recheck medical problems.  Fontaine Hehl P. Jacelyn Grip, M.D.

## 2012-08-19 NOTE — Telephone Encounter (Signed)
It was denied . He is being seen 08-19-12

## 2012-08-19 NOTE — Patient Instructions (Addendum)
Dr Paula Libra Recommendations  Diet and Exercise discussed with patient.  For nutrition information, I recommend books:  1).Eat to Live by Dr Excell Seltzer. 2).Prevent and Reverse Heart Disease by Dr Karl Luke. 3) Dr Janene Harvey Book:  Program to Reverse Diabetes  Exercise recommendations are:  If unable to walk, then the patient can exercise in a chair 3 times a day. By flapping arms like a bird gently and raising legs outwards to the front.  If ambulatory, the patient can go for walks for 30 minutes 3 times a week. Then increase the intensity and duration as tolerated.  Goal is to try to attain exercise frequency to 5 times a week.  If applicable: Best to perform resistance exercises (machines or weights) 2 days a week and cardio type exercises 3 days per week.   Obesity Obesity is having too much body fat and a body mass index (BMI) of 30 or more. BMI is a number based on your height and weight. The number is an estimate of how much body fat you have. Obesity can happen if you eat more calories than you can burn by exercising or other activity. It can cause major health problems or emergencies.  HOME CARE  Exercise and be active as told by your doctor. Try:  Using stairs when you can.  Parking farther away from store doors.  Gardening, biking, or walking.  Eat healthy foods and drinks that are low in calories. Eat more fruits and vegetables.  Limit fast food, sweets, and snack foods that are made with ingredients that are not natural (processed food).  Eat smaller amounts of food.  Keep a journal and write down what you eat every day. Websites can help with this.  Avoid drinking alcohol. Drink more water and drinks without calories.   Take vitamins and dietary pills (supplements) only as told by your doctor.  Try going to weight-loss support groups or classes to help lessen stress. Dieticians and counselors may also help. GET HELP RIGHT AWAY  IF:  You have chest pain or tightness.  You have trouble breathing or feel short of breath.  You feel weak or have loss of feeling (numbness) in your legs.  You feel confused or have trouble talking.  You have sudden changes in your vision. MAKE SURE YOU:  Understand these instructions.  Will watch your condition.  Will get help right away if you are not doing well or get worse. Document Released: 03/19/2011 Document Reviewed: 03/19/2011 [facility] Patient Information 2014 [place].    Diabetes and Exercise Regular exercise is important and can help:   Control blood glucose (sugar).  Decrease blood pressure.    Control blood lipids (cholesterol, triglycerides).  Improve overall health. BENEFITS FROM EXERCISE  Improved fitness.  Improved flexibility.  Improved endurance.  Increased bone density.  Weight control.  Increased muscle strength.  Decreased body fat.  Improvement of the body's use of insulin, a hormone.  Increased insulin sensitivity.  Reduction of insulin needs.  Reduced stress and tension.  Helps you feel better. People with diabetes who add exercise to their lifestyle gain additional benefits, including:  Weight loss.  Reduced appetite.  Improvement of the body's use of blood glucose.  Decreased risk factors for heart disease:  Lowering of cholesterol and triglycerides.  Raising the level of good cholesterol (high-density lipoproteins, HDL).  Lowering blood sugar.  Decreased blood pressure. TYPE 1 DIABETES AND EXERCISE  Exercise will usually lower your blood glucose.  If blood glucose is greater than 240 mg/dl, check urine ketones. If ketones are present, do not exercise.  Location of the insulin injection sites may need to be adjusted with exercise. Avoid injecting insulin into areas of the body that will be exercised. For example, avoid injecting insulin into:  The arms when playing tennis.  The legs when  jogging. For more information, discuss this with your caregiver.  Keep a record of:  Food intake.  Type and amount of exercise.  Expected peak times of insulin action.  Blood glucose levels. Do this before, during, and after exercise. Review your records with your caregiver. This will help you to develop guidelines for adjusting food intake and insulin amounts.  TYPE 2 DIABETES AND EXERCISE  Regular physical activity can help control blood glucose.  Exercise is important because it may:  Increase the body's sensitivity to insulin.  Improve blood glucose control.  Exercise reduces the risk of heart disease. It decreases serum cholesterol and triglycerides. It also lowers blood pressure.  Those who take insulin or oral hypoglycemic agents should watch for signs of hypoglycemia. These signs include dizziness, shaking, sweating, chills, and confusion.  Body water is lost during exercise. It must be replaced. This will help to avoid loss of body fluids (dehydration) or heat stroke. Be sure to talk to your caregiver before starting an exercise program to make sure it is safe for you. Remember, any activity is better than none.  Document Released: 03/17/2003 Document Revised: 03/19/2011 Document Reviewed: 07/01/2008 [facility] Patient Information 2014 [place], [place].   Diabetes and Foot Care Diabetes may cause you to have a poor blood supply (circulation) to your legs and feet. Because of this, the skin may be thinner, break easier, and heal more slowly. You also may have nerve damage in your legs and feet causing decreased feeling. You may not notice minor injuries to your feet that could lead to serious problems or infections. Taking care of your feet is one of the most important things you can do for yourself.  HOME CARE INSTRUCTIONS  Do not go barefoot. Bare feet are easily injured.  Check your feet daily for blisters, cuts, and redness.  Wash your feet with warm water (not hot)  and mild soap. Pat your feet and between your toes until completely dry.  Apply a moisturizing lotion that does not contain alcohol or petroleum jelly to the dry skin on your feet and to dry brittle toenails. Do not put it between your toes.  Trim your toenails straight across. Do not dig under them or around the cuticle.  Do not cut corns or calluses, or try to remove them with medicine.  Wear clean cotton socks or stockings every day. Make sure they are not too tight. Do not wear knee high stockings since they may decrease blood flow to your legs.  Wear leather shoes that fit properly and have enough cushioning. To break in new shoes, wear them just a few hours a day to avoid injuring your feet.  Wear shoes at all times, even in the house.  Do not cross your legs. This may decrease the blood flow to your feet.  If you find a minor scrape, cut, or break in the skin on your feet, keep it and the skin around it clean and dry. These areas may be cleansed with mild soap and water. Do not use peroxide, alcohol, iodine or Merthiolate.  When you remove an adhesive bandage, be sure not to harm the  skin around it.  If you have a wound, look at it several times a day to make sure it is healing.  Do not use heating pads or hot water bottles. Burns can occur. If you have lost feeling in your feet or legs, you may not know it is happening until it is too late.  Report any cuts, sores or bruises to your caregiver. Do not wait! SEEK MEDICAL CARE IF:   You have an injury that is not healing or you notice redness, numbness, burning, or tingling.  Your feet always feel cold.  You have pain or cramps in your legs and feet. SEEK IMMEDIATE MEDICAL CARE IF:   There is increasing redness, swelling, or increasing pain in the wound.  There is a red line that goes up your leg.  Pus is coming from a wound.  You develop an unexplained oral temperature above 102 F (38.9 C), or as your caregiver  suggests.  You notice a bad smell coming from an ulcer or wound. MAKE SURE YOU:   Understand these instructions.  Will watch your condition.  Will get help right away if you are not doing well or get worse. Document Released: 12/23/1999 Document Revised: 03/19/2011 Document Reviewed: 06/30/2008 [facility] Patient Information 2014 [place], [place].

## 2012-08-21 LAB — CBC WITH DIFFERENTIAL
Basophils Absolute: 0 10*3/uL (ref 0.0–0.2)
Basos: 2 % (ref 0–3)
Eos: 4 % (ref 0–5)
Eosinophils Absolute: 0.1 10*3/uL (ref 0.0–0.4)
HCT: 43.4 % (ref 37.5–51.0)
Hemoglobin: 14.5 g/dL (ref 12.6–17.7)
Immature Grans (Abs): 0 10*3/uL (ref 0.0–0.1)
Immature Granulocytes: 0 % (ref 0–2)
Lymphocytes Absolute: 0.7 10*3/uL (ref 0.7–3.1)
Lymphs: 27 % (ref 14–46)
MCH: 28.4 pg (ref 26.6–33.0)
MCHC: 33.4 g/dL (ref 31.5–35.7)
MCV: 85 fL (ref 79–97)
Monocytes Absolute: 0.2 10*3/uL (ref 0.1–0.9)
Monocytes: 10 % (ref 4–12)
Neutrophils Absolute: 1.4 10*3/uL (ref 1.4–7.0)
Neutrophils Relative %: 57 % (ref 40–74)
Platelets: 48 10*3/uL — CL (ref 150–379)
RBC: 5.1 x10E6/uL (ref 4.14–5.80)
RDW: 16.3 % — ABNORMAL HIGH (ref 12.3–15.4)
WBC: 2.5 10*3/uL — CL (ref 3.4–10.8)

## 2012-08-21 LAB — TESTOSTERONE,FREE AND TOTAL
Testosterone, Free: 2.9 pg/mL — ABNORMAL LOW (ref 6.8–21.5)
Testosterone: 444 ng/dL (ref 348–1197)

## 2012-08-21 LAB — CMP14+EGFR
ALT: 43 IU/L (ref 0–44)
AST: 60 IU/L — ABNORMAL HIGH (ref 0–40)
Albumin/Globulin Ratio: 1.6 (ref 1.1–2.5)
Albumin: 4.5 g/dL (ref 3.5–5.5)
Alkaline Phosphatase: 65 IU/L (ref 39–117)
BUN/Creatinine Ratio: 15 (ref 9–20)
BUN: 11 mg/dL (ref 6–24)
CO2: 24 mmol/L (ref 18–29)
Calcium: 8.9 mg/dL (ref 8.7–10.2)
Chloride: 103 mmol/L (ref 97–108)
Creatinine, Ser: 0.71 mg/dL — ABNORMAL LOW (ref 0.76–1.27)
GFR calc Af Amer: 128 mL/min/{1.73_m2} (ref >59)
GFR calc non Af Amer: 111 mL/min/{1.73_m2} (ref >59)
Globulin, Total: 2.9 g/dL (ref 1.5–4.5)
Glucose: 108 mg/dL — ABNORMAL HIGH (ref 65–99)
Potassium: 4.6 mmol/L (ref 3.5–5.2)
Sodium: 140 mmol/L (ref 134–144)
Total Bilirubin: 0.6 mg/dL (ref 0.0–1.2)
Total Protein: 7.4 g/dL (ref 6.0–8.5)

## 2012-08-21 LAB — BRAIN NATRIURETIC PEPTIDE: BNP: 4.6 pg/mL (ref 0.0–100.0)

## 2012-08-21 LAB — NMR, LIPOPROFILE
Cholesterol: 138 mg/dL (ref <200)
HDL Cholesterol by NMR: 42 mg/dL (ref >=40)
HDL Particle Number: 29.2 umol/L — ABNORMAL LOW (ref >=30.5)
LDL Particle Number: 1218 nmol/L — ABNORMAL HIGH (ref <1000)
LDL Size: 20 nm — ABNORMAL LOW (ref >20.5)
LDLC SERPL CALC-MCNC: 72 mg/dL (ref <100)
LP-IR Score: 51 — ABNORMAL HIGH (ref <=45)
Small LDL Particle Number: 881 nmol/L — ABNORMAL HIGH (ref <=527)
Triglycerides by NMR: 122 mg/dL (ref <150)

## 2012-08-21 LAB — URIC ACID: Uric Acid: 7.5 mg/dL (ref 3.7–8.6)

## 2012-08-21 NOTE — Progress Notes (Signed)
Quick Note:  Labs abnormal. One liver enzyme elevated The platelet is still low The test for Dm is good The uric acid is high. Will need to increase medications for this. But I am concerned about the liver The total testosterone result is missing only the free is resulted. Lab to track down results. Patient needs to see his hematologist in regards to the Thrombocytopenia. I will need him to adjust his diet to lower the uric acid.  He needs to be on a low purine diet. He needs to see the clinical Pharmacist/Tammy or Sharyn Lull for diet counseling and medication adjustment.   ______

## 2012-08-22 ENCOUNTER — Other Ambulatory Visit: Payer: Self-pay | Admitting: Family Medicine

## 2012-08-22 DIAGNOSIS — R7989 Other specified abnormal findings of blood chemistry: Secondary | ICD-10-CM

## 2012-09-04 ENCOUNTER — Telehealth: Payer: Self-pay | Admitting: Family Medicine

## 2012-09-05 ENCOUNTER — Other Ambulatory Visit: Payer: Self-pay | Admitting: Family Medicine

## 2012-09-05 DIAGNOSIS — G4700 Insomnia, unspecified: Secondary | ICD-10-CM

## 2012-09-05 MED ORDER — DOXEPIN HCL 10 MG PO CAPS: 10.0000 mg | ORAL_CAPSULE | Freq: Every day | ORAL | Status: DC

## 2012-09-05 NOTE — Telephone Encounter (Signed)
He is on doxepin 3 mg as the brand silenor. I will change it to the 5 or 10 mg generic in EPIC.

## 2012-09-10 ENCOUNTER — Ambulatory Visit (INDEPENDENT_AMBULATORY_CARE_PROVIDER_SITE_OTHER): Payer: Medicare Other | Admitting: Adult Health

## 2012-09-10 ENCOUNTER — Encounter: Payer: Self-pay | Admitting: Adult Health

## 2012-09-10 VITALS — BP 120/72 | HR 80 | Temp 96.8°F | Ht 72.0 in | Wt >= 6400 oz

## 2012-09-10 DIAGNOSIS — G4733 Obstructive sleep apnea (adult) (pediatric): Secondary | ICD-10-CM

## 2012-09-10 DIAGNOSIS — I5032 Chronic diastolic (congestive) heart failure: Secondary | ICD-10-CM

## 2012-09-10 DIAGNOSIS — Z23 Encounter for immunization: Secondary | ICD-10-CM

## 2012-09-10 DIAGNOSIS — J45909 Unspecified asthma, uncomplicated: Secondary | ICD-10-CM

## 2012-09-10 NOTE — Assessment & Plan Note (Signed)
Compensated on present regimen without flare. Continue on current regimen

## 2012-09-10 NOTE — Patient Instructions (Addendum)
Continue on CPAP At bedtime   Weight loss -great job keep up good work on losing weight  Follow up Dr. Elsworth Soho  In 4-6  months and As needed   Flu shot today

## 2012-09-10 NOTE — Progress Notes (Signed)
  Subjective:    Patient ID: [name], male    DOB: [birth year], 47 y.o.   MRN: [id number]  HPI  PCP- Moss Mc  56 male , morbidly obese for FU of severe OSA, on CPAP since 1/09.  PSG showed AHI 78/h when his wt was 385 lbs   PFTs '11 do not show obstruction, FEV1 81%, TLC 76%, DLCO 70% corrects for alveolar volume  download 1/5- 04/12/09 on auto 10-20 >> excellent compliance, avg pr 16, residual AHI 2.6/h  wt 400 lbs ! Unclear if advair/spiriva helping    01/14/12  3mFU Download 8/13 reviewed >> good compliance ,No residual events ,avg pr 14.6 cm ONO 08/28/11 >>No significant desatn on CPAP/RA  Does not need oxygen in my opinion Gained 14lbs   wears cpap everynight x 6-10 hrs a night. feels like mask is too small. does not like full face mask.  breathing is been fair. denies ay cough, wheezing, no chest tx Stopped spiriva- unsure if advair helps Remains on Advair for asthma without flare in cough or wheezing  On prior visits, no destauration noted on ambulation or on ONO Spirometry >> no airway obstruction or restriction No desaturation on walking today >>  09/10/2012 Follow up OSA and Asthma Wears CPAP everynight on average 8-10 hrs .  Working with iIT consultantwith weight loss program.  Weighed 406 lb this am on digital scales.  Trying to eat better, smaller meals . Has lost almost 20lbs.  Trying to be more active with yard work .  Last ov Advair d/c , spirometry with no airflow obstruction .    No flare in cough or wheezing.  Rare use of Albuterol neb, ~1 /mon -mainly if it is really hot outside.  No chest pain , orthopnea or worsening chronic edema.  [facility], reports was neg. No records at todays visit.  Leg swelling is some better. Takes Demadex 223m, 3 tabs daily .    Review of Systems  neg for any significant sore throat, dysphagia, itching, sneezing, nasal congestion or excess/ purulent  secretions, fever, chills, sweats, unintended wt loss, pleuritic or exertional cp, hempoptysis, orthopnea pnd or change in chronic leg swelling. Also denies presyncope, palpitations, heartburn, abdominal pain, nausea, vomiting, diarrhea or change in bowel or urinary habits, dysuria,hematuria, rash, arthralgias, visual complaints, headache, numbness weakness or ataxia.     Objective:   Physical Exam   Gen. Pleasant, obese, in no distress ENT - no lesions, no post nasal drip Neck: No JVD, no thyromegaly, no carotid bruits Lungs: no use of accessory muscles, no dullness to percussion, decreased without rales or rhonchi  Cardiovascular: Rhythm regular, heart sounds  normal, no murmurs or gallops, tr-1 peripheral edema-stasis dermatitis  Musculoskeletal: No deformities, no cyanosis or clubbing , no tremors Skin: psoriatic patches along elbows.         Assessment & Plan:

## 2012-09-10 NOTE — Assessment & Plan Note (Signed)
Mild intermittent asthma, without flare off Advair. Patient is to continue on current regimen. Follow up in office in 4-6 months and as needed and

## 2012-09-10 NOTE — Assessment & Plan Note (Signed)
Doing well with nocturnal CPAP. Continue with weight loss.

## 2012-09-16 ENCOUNTER — Other Ambulatory Visit: Payer: Self-pay

## 2012-09-16 DIAGNOSIS — M79609 Pain in unspecified limb: Secondary | ICD-10-CM

## 2012-09-16 NOTE — Telephone Encounter (Signed)
This med not on EPIC list of meds  Last seen 08/19/12   If approved print because has another RX for Hydrocodone and route to nurse

## 2012-09-16 NOTE — Telephone Encounter (Signed)
Last seen and filled 08/19/12  [facility]   If approved print and route to nurse

## 2012-09-18 NOTE — Telephone Encounter (Signed)
Denied see note. At risk for  Respiratory arrest.

## 2012-09-18 NOTE — Telephone Encounter (Signed)
Was placed on doxepin for sleep. Risk for respiratory depression and respiratory arrest. Denied.

## 2012-09-19 ENCOUNTER — Other Ambulatory Visit: Payer: Self-pay | Admitting: Family Medicine

## 2012-09-19 DIAGNOSIS — M79609 Pain in unspecified limb: Secondary | ICD-10-CM

## 2012-09-19 MED ORDER — HYDROCODONE-ACETAMINOPHEN 5-325 MG PO TABS: 1.0000 | ORAL_TABLET | Freq: Three times a day (TID) | ORAL | Status: DC | PRN

## 2012-09-19 NOTE — Telephone Encounter (Signed)
Pt aware.

## 2012-09-19 NOTE — Telephone Encounter (Signed)
Drug store notified that alprazolam denied  Wants to know if can fill hydrocodone

## 2012-09-19 NOTE — Telephone Encounter (Signed)
Rx ready for pick up. I reduced the quantity and strength, due to risk for respiratory depression.

## 2012-09-22 ENCOUNTER — Other Ambulatory Visit: Payer: Self-pay | Admitting: Family Medicine

## 2012-09-22 DIAGNOSIS — M79609 Pain in unspecified limb: Secondary | ICD-10-CM

## 2012-09-22 MED ORDER — HYDROCODONE-IBUPROFEN 5-200 MG PO TABS: 1.0000 | ORAL_TABLET | Freq: Three times a day (TID) | ORAL | Status: DC | PRN

## 2012-09-23 ENCOUNTER — Encounter: Payer: Self-pay | Admitting: *Deleted

## 2012-09-23 ENCOUNTER — Encounter (INDEPENDENT_AMBULATORY_CARE_PROVIDER_SITE_OTHER): Payer: Self-pay | Admitting: *Deleted

## 2012-09-24 ENCOUNTER — Telehealth: Payer: Self-pay | Admitting: Family Medicine

## 2012-09-25 ENCOUNTER — Other Ambulatory Visit: Payer: Self-pay | Admitting: Family Medicine

## 2012-09-25 DIAGNOSIS — M79609 Pain in unspecified limb: Secondary | ICD-10-CM

## 2012-09-25 MED ORDER — HYDROCODONE-IBUPROFEN 7.5-200 MG PO TABS: 1.0000 | ORAL_TABLET | Freq: Three times a day (TID) | ORAL | Status: DC | PRN

## 2012-09-25 NOTE — Telephone Encounter (Signed)
Rx ready for pick up. 

## 2012-09-26 NOTE — Telephone Encounter (Signed)
Pt aware that rx ready for pick up for hydrocodone 7.5-200

## 2012-09-29 ENCOUNTER — Other Ambulatory Visit: Payer: Self-pay

## 2012-09-29 NOTE — Telephone Encounter (Signed)
Last seen 08/19/12  [facility]  This med was not on EPIC list

## 2012-09-30 ENCOUNTER — Telehealth: Payer: Self-pay | Admitting: Family Medicine

## 2012-09-30 NOTE — Telephone Encounter (Signed)
Not Rx by me

## 2012-10-01 NOTE — Telephone Encounter (Signed)
LMTCB 10/01/12

## 2012-10-03 ENCOUNTER — Telehealth: Payer: Self-pay | Admitting: *Deleted

## 2012-10-03 NOTE — Telephone Encounter (Signed)
Talked with [name] and told him we would need to see him before we filled out the form for his diabetic shoes, I explained that the Dr. Must examine his feet and document reason for shoes( not just that he is a diabetic).  He understood and will make an appointment.

## 2012-10-07 ENCOUNTER — Ambulatory Visit (INDEPENDENT_AMBULATORY_CARE_PROVIDER_SITE_OTHER): Payer: Medicare Other | Admitting: Pharmacist Clinician (PhC)/ Clinical Pharmacy Specialist

## 2012-10-07 DIAGNOSIS — M549 Dorsalgia, unspecified: Secondary | ICD-10-CM

## 2012-10-07 NOTE — Progress Notes (Signed)
[name] comes in today for chronic pain management.  He has already signed a pain Futures trader.  Patient was previously on hydrocodone 7.5/ibuprofen 266m every 4 hours and that was cut back to qid last year.  Patient most recently had his prescription cut back to tid.  He states that his pain has worsened and he needs to go back to qid to maintain his QOL.  He also has not been able to sleep with his CPAP machine since coming off of both ambien and xanax qhs.  I was agreeable to patient going back on alprazolam 0.572m1-2qhs for insomnia.  He is due 10/11 for his hydrocodone refill and I have already talked with [facility] the [facility] to authorize this since the law takes effect next week.  Patient was given #120 hydrocodone 7.5/200  to last 30 days and it can not be filled until 10/11;  and a prescription for alprazolam 0.99m16m60 (it was last filled on 8/22).  Patient will follow up in 4 weeks.  Will need labs and urine drug screening done at that time.  Patient is agreeable to changes.

## 2012-10-13 ENCOUNTER — Ambulatory Visit: Payer: Medicare Other | Admitting: Family Medicine

## 2012-10-24 ENCOUNTER — Ambulatory Visit (INDEPENDENT_AMBULATORY_CARE_PROVIDER_SITE_OTHER): Payer: Medicare Other | Admitting: Family Medicine

## 2012-10-24 ENCOUNTER — Encounter: Payer: Self-pay | Admitting: Family Medicine

## 2012-10-24 VITALS — BP 116/67 | HR 80 | Temp 97.2°F | Ht 72.0 in | Wt >= 6400 oz

## 2012-10-24 DIAGNOSIS — I2789 Other specified pulmonary heart diseases: Secondary | ICD-10-CM

## 2012-10-24 DIAGNOSIS — M79609 Pain in unspecified limb: Secondary | ICD-10-CM

## 2012-10-24 DIAGNOSIS — E291 Testicular hypofunction: Secondary | ICD-10-CM

## 2012-10-24 DIAGNOSIS — K429 Umbilical hernia without obstruction or gangrene: Secondary | ICD-10-CM

## 2012-10-24 DIAGNOSIS — E119 Type 2 diabetes mellitus without complications: Secondary | ICD-10-CM

## 2012-10-24 DIAGNOSIS — D696 Thrombocytopenia, unspecified: Secondary | ICD-10-CM

## 2012-10-24 DIAGNOSIS — I2510 Atherosclerotic heart disease of native coronary artery without angina pectoris: Secondary | ICD-10-CM

## 2012-10-24 DIAGNOSIS — M109 Gout, unspecified: Secondary | ICD-10-CM

## 2012-10-24 DIAGNOSIS — M21169 Varus deformity, not elsewhere classified, unspecified knee: Secondary | ICD-10-CM | POA: Insufficient documentation

## 2012-10-24 DIAGNOSIS — K7460 Unspecified cirrhosis of liver: Secondary | ICD-10-CM

## 2012-10-24 DIAGNOSIS — I4891 Unspecified atrial fibrillation: Secondary | ICD-10-CM

## 2012-10-24 DIAGNOSIS — I5032 Chronic diastolic (congestive) heart failure: Secondary | ICD-10-CM

## 2012-10-24 DIAGNOSIS — G4733 Obstructive sleep apnea (adult) (pediatric): Secondary | ICD-10-CM

## 2012-10-24 NOTE — Progress Notes (Signed)
SUBJECTIVE: CC: Chief Complaint  Patient presents with  . Follow-up    face to face diabetic shoes     HPI: Came to see if I would Rx DM shoes . He has had a Dx of DM in the past and was on Metformin. He has subsequently been taken off of metformin because he had lost weigh and his HGBA1C has been normal. He has had right foot surgery because of right foot and ankle problems. Associated with this is Genu Varum or Bow legs. He had [facility] through [facility],   Past Medical History  Diagnosis Date  . Gout   . Diabetes mellitus     type 2  . RA (rheumatoid arthritis)   . COPD (chronic obstructive pulmonary disease)     with cpap  . Atrial fibrillation   . Cirrhosis   . Anxiety   . Heart murmur   . CHF (congestive heart failure)   . Constipation   . Sleep apnea     cpap used- level 10 and greater  . Neuromuscular disorder     neuropathy in hands and feet  . Cirrhosis     NASH  . Thrombocytopenia   . Genu varus    Past Surgical History  Procedure Laterality Date  . Sciatic nerve exploration    . Tonsillectomy    . Esophagogastroduodenoscopy  02/28/2011    Procedure: ESOPHAGOGASTRODUODENOSCOPY (EGD);  Surgeon: Rogene Houston, MD;  Location: [facility];  Service: Endoscopy;  Laterality: N/A;  1200  . Colonoscopy  05/08/2011    Procedure: COLONOSCOPY;  Surgeon: Rogene Houston, MD;  Location: [facility];  Service: Endoscopy;  Laterality: N/A;  730  . Hernia repair      as child  . Liver biopsy  2012  . Ankle surgery      right ankle talor repair  . Esophagogastroduodenoscopy N/A 06/11/2012    Procedure: ESOPHAGOGASTRODUODENOSCOPY (EGD);  Surgeon: Rogene Houston, MD;  Location: [facility];  Service: Endoscopy;  Laterality: N/A;  1200  FYI patient is 400 pounds   History   Social History  . Marital Status: Divorced    Spouse Name: N/A    Number of Children: N/A  . Years of Education: N/A   Occupational History  . Not on file.   Social  History Main Topics  . Smoking status: Never Smoker   . Smokeless tobacco: Current User    Types: Snuff, Chew     Comment: Pt reports that he "dips"  . Alcohol Use: 0.6 oz/week    1 Cans of beer per week  . Drug Use: No     Comment: Use to smoke cocaine and marijuana. No IV drug use  . Sexual Activity: Yes   Other Topics Concern  . Not on file   Social History Narrative  . No narrative on file   Family History  Problem Relation Age of Onset  . Colon cancer Mother   . Cancer Mother     intestine  . Cancer Father     throat  . Cancer Brother     brain  . Cancer Sister    Current Outpatient Prescriptions on File Prior to Visit  Medication Sig Dispense Refill  . albuterol (PROVENTIL HFA;VENTOLIN HFA) 108 (90 BASE) MCG/ACT inhaler Inhale 2 puffs into the lungs every 6 (six) hours as needed. For shortness of breath      . allopurinol (ZYLOPRIM) 300 MG tablet Take 1 tablet (300 mg total)  by mouth daily.  30 tablet  6  . aspirin EC 81 MG tablet Take 81 mg by mouth once as needed.       . COLCRYS 0.6 MG tablet Take 0.6 mg by mouth as needed. For gout      . diltiazem (CARDIZEM) 90 MG tablet Take 1 tablet (90 mg total) by mouth 3 (three) times daily.  90 tablet  2  . doxepin (SINEQUAN) 10 MG capsule Take 1 capsule (10 mg total) by mouth at bedtime.  30 capsule  2  . ezetimibe (ZETIA) 10 MG tablet Take 1 tablet (10 mg total) by mouth daily.  30 tablet  5  . fluticasone (FLONASE) 50 MCG/ACT nasal spray Place 2 sprays into both nostrils daily as needed. For allergies      . folic acid (FOLVITE) 892 MCG tablet Take 400 mcg by mouth daily.      Marland Kitchen HYDROcodone-ibuprofen (VICOPROFEN) 7.5-200 MG per tablet Take 1 tablet by mouth every 8 (eight) hours as needed for pain.  90 tablet  0  . hydrocortisone 1 % ointment Apply 1 application topically 3 (three) times daily as needed. Patient states that this is a 2% Oint.      Marland Kitchen ipratropium (ATROVENT) 0.02 % nebulizer solution Take 500 mcg by  nebulization 4 (four) times daily as needed. For wheezing      . lidocaine (XYLOCAINE) 5 % ointment Apply 1 application topically 4 (four) times daily as needed. To knees and ankle as needed for pain      . Menthol-Methyl Salicylate (MUSCLE RUB) 10-15 % CREA Apply 1 application topically as needed. For muscle pain      . milk thistle 175 MG tablet Take 175 mg by mouth 4 (four) times daily.       . Multiple Vitamin (MULITIVITAMIN WITH MINERALS) TABS Take 1 tablet by mouth daily.      Marland Kitchen neomycin-bacitracin-polymyxin (NEOSPORIN) ointment Apply 1 application topically every 12 (twelve) hours as needed. For cut on ankle      . NEXIUM 40 MG capsule Take 40 mg by mouth Daily.       . nitroGLYCERIN (NITROSTAT) 0.4 MG SL tablet Place 0.4 mg under the tongue every 5 (five) minutes as needed for chest pain (may repeat x3).      . phenylephrine (NASAL DECONGESTANT) 1 % nasal spray Place 1 drop into the nose every 4 (four) hours as needed. For allergies      . potassium chloride (MICRO-K) 10 MEQ CR capsule Take 1 capsule (10 mEq total) by mouth daily.  30 capsule  5  . pregabalin (LYRICA) 300 MG capsule One in am and two and night      . testosterone (ANDROGEL) 50 MG/5GM GEL Place 5 g onto the skin daily.        Marland Kitchen torsemide (DEMADEX) 20 MG tablet 2 tabs in the morning and 1 tab 6 hours later      . vitamin B-12 (CYANOCOBALAMIN) 100 MCG tablet Take 50 mcg by mouth daily.       No current facility-administered medications on file prior to visit.   Allergies  Allergen Reactions  . Oxycodone Base Itching and Nausea And Vomiting  . Prozac [Fluoxetine Hcl] Itching   Immunization History  Administered Date(s) Administered  . Influenza Whole 10/11/2008, 01/09/2010, 11/09/2011  . Influenza,inj,Quad PF,36+ Mos 09/10/2012  . Pneumococcal Polysaccharide 09/08/2008  . Td 01/09/2008   Prior to Admission medications   Medication Sig Start Date End Date Taking?  Authorizing Provider  albuterol (PROVENTIL  HFA;VENTOLIN HFA) 108 (90 BASE) MCG/ACT inhaler Inhale 2 puffs into the lungs every 6 (six) hours as needed. For shortness of breath    Historical Provider, MD  allopurinol (ZYLOPRIM) 300 MG tablet Take 1 tablet (300 mg total) by mouth daily. 08/19/12   Vernie Shanks, MD  ALPRAZolam Duanne Moron) 0.5 MG tablet 0.5 mg at bedtime as needed. Pt states he takes usually 2 sometimes 3 per night 10/07/12   Historical Provider, MD  aspirin EC 81 MG tablet Take 81 mg by mouth once as needed.     Historical Provider, MD  COLCRYS 0.6 MG tablet Take 0.6 mg by mouth as needed. For gout 12/24/11   Historical Provider, MD  diltiazem (CARDIZEM) 90 MG tablet Take 1 tablet (90 mg total) by mouth 3 (three) times daily. 08/19/12   Vernie Shanks, MD  doxepin (SINEQUAN) 10 MG capsule Take 1 capsule (10 mg total) by mouth at bedtime. 09/05/12   Vernie Shanks, MD  ezetimibe (ZETIA) 10 MG tablet Take 1 tablet (10 mg total) by mouth daily. 08/19/12   Vernie Shanks, MD  fluticasone (FLONASE) 50 MCG/ACT nasal spray Place 2 sprays into both nostrils daily as needed. For allergies 05/23/11   Historical Provider, MD  folic acid (FOLVITE) 956 MCG tablet Take 400 mcg by mouth daily.    Historical Provider, MD  HYDROcodone-ibuprofen (VICOPROFEN) 7.5-200 MG per tablet Take 1 tablet by mouth every 8 (eight) hours as needed for pain. 09/25/12   Vernie Shanks, MD  hydrocortisone 1 % ointment Apply 1 application topically 3 (three) times daily as needed. Patient states that this is a 2% Oint.    Historical Provider, MD  ipratropium (ATROVENT) 0.02 % nebulizer solution Take 500 mcg by nebulization 4 (four) times daily as needed. For wheezing    Historical Provider, MD  lidocaine (XYLOCAINE) 5 % ointment Apply 1 application topically 4 (four) times daily as needed. To knees and ankle as needed for pain 08/08/11   Historical Provider, MD  Menthol-Methyl Salicylate (MUSCLE RUB) 10-15 % CREA Apply 1 application topically as needed. For muscle pain     Historical Provider, MD  milk thistle 175 MG tablet Take 175 mg by mouth 4 (four) times daily.     Historical Provider, MD  Multiple Vitamin (MULITIVITAMIN WITH MINERALS) TABS Take 1 tablet by mouth daily.    Historical Provider, MD  neomycin-bacitracin-polymyxin (NEOSPORIN) ointment Apply 1 application topically every 12 (twelve) hours as needed. For cut on ankle    Historical Provider, MD  NEXIUM 40 MG capsule Take 40 mg by mouth Daily.  11/29/11   Historical Provider, MD  nitroGLYCERIN (NITROSTAT) 0.4 MG SL tablet Place 0.4 mg under the tongue every 5 (five) minutes as needed for chest pain (may repeat x3).    Historical Provider, MD  phenylephrine (NASAL DECONGESTANT) 1 % nasal spray Place 1 drop into the nose every 4 (four) hours as needed. For allergies    Historical Provider, MD  potassium chloride (MICRO-K) 10 MEQ CR capsule Take 1 capsule (10 mEq total) by mouth daily. 08/19/12   Vernie Shanks, MD  pregabalin (LYRICA) 300 MG capsule One in am and two and night 08/19/12   Vernie Shanks, MD  SILENOR 3 MG TABS  08/19/12   Historical Provider, MD  testosterone (ANDROGEL) 50 MG/5GM GEL Place 5 g onto the skin daily.      Historical Provider, MD  torsemide (DEMADEX) 20 MG tablet  2 tabs in the morning and 1 tab 6 hours later 08/19/12   Vernie Shanks, MD  vitamin B-12 Morbidly Obese WM (CYANOCOBALAMIN) 100 MCG tablet Take 50 mcg by mouth daily.    Historical Provider, MD  zolpidem (AMBIEN) 10 MG tablet Take 10 mg by mouth at bedtime as needed.  07/31/12   Historical Provider, MD     ROS: As above in the HPI. All other systems are stable or negative.  OBJECTIVE: APPEARANCE:  Patient in no acute distress.The patient appeared well nourished and normally developed. Acyanotic. Waist: VITAL SIGNS:BP 116/67  Pulse 80  Temp(Src) 97.2 F (36.2 C) (Oral)  Ht 6' (1.829 m)  Wt 422 lb 6.4 oz (191.599 kg)  BMI 57.28 kg/m2  Morbidly Obese WM. Poor comprehension and understanding.  SKIN: warm and   Dry without overt rashes, tattoos and scars  HEAD and Neck: without JVD, Head and scalp: normal Eyes:No scleral icterus. Fundi normal, eye movements normal. Ears: Auricle normal, canal normal, Tympanic membranes normal, insufflation normal. Nose: normal Throat: normal Neck & thyroid: normal  CHEST & LUNGS: Chest wall: normal Lungs: Clear  CVS: Reveals the PMI to be normally located. Regular rhythm, First and Second Heart sounds are normal,  absence of murmurs, rubs or gallops. Peripheral vasculature: Radial pulses: normal Dorsal pedis pulses: normal Posterior pulses: normal  ABDOMEN:  Appearance: normal Benign, no organomegaly, no masses, no Abdominal Aortic enlargement. No Guarding , no rebound. No Bruits. Bowel sounds: normal  RECTAL: N/A GU: N/A  EXTREMETIES: nonedematous.  MUSCULOSKELETAL:  Bilateral Genu Varum. Past surgery of the right ankle. Patient tends to walk with pronation of the feet.   NEUROLOGIC: oriented to time,place and person; nonfocal. .  ASSESSMENT: Genu varus, unspecified laterality - Plan: Ambulatory referral to Orthopedic Surgery  DIABETES MELLITUS - corrected with diet and weight loss.  CAD  GOUT  HYPERTENSION, PULMONARY  LEG PAIN  THROMBOCYTOPENIA  OBESITY, MORBID  Atrial fibrillation  DIASTOLIC HEART FAILURE, CHRONIC  Hypogonadism male  Umbilical hernia, reducible  Cirrhosis  SLEEP APNEA, OBSTRUCTIVE  PLAN: Discussed with patient that it was not appropriate to Rx DM shoes when he needs orthopedic prosthetics. Especially when he is diet controlled and his labs was no longer reflective of DM.  Orders Placed This Encounter  Procedures  . Ambulatory referral to Orthopedic Surgery    Referral Priority:  Routine    Referral Type:  Surgical    Referral Reason:  Specialty Services Required    Requested Specialty:  Orthopedic Surgery    Number of Visits Requested:  1   Meds ordered this encounter  Medications  .  ALPRAZolam (XANAX) 0.5 MG tablet    Sig: 0.5 mg at bedtime as needed. Pt states he takes usually 2 sometimes 3 per night  . SILENOR 3 MG TABS    Sig:   . zolpidem (AMBIEN) 10 MG tablet    Sig: Take 10 mg by mouth at bedtime as needed.   above meds were not Rx at this visit.   There are no discontinued medications. No Follow-up on file. Keep his routine follow up next month.  Dawood Spitler P. Jacelyn Grip, M.D.

## 2012-11-06 ENCOUNTER — Telehealth: Payer: Self-pay | Admitting: Family Medicine

## 2012-11-11 ENCOUNTER — Ambulatory Visit: Payer: Medicare Other | Admitting: Family Medicine

## 2012-11-11 ENCOUNTER — Ambulatory Visit (INDEPENDENT_AMBULATORY_CARE_PROVIDER_SITE_OTHER): Payer: Medicare Other | Admitting: Internal Medicine

## 2012-11-11 ENCOUNTER — Telehealth: Payer: Self-pay | Admitting: Family Medicine

## 2012-11-12 NOTE — Telephone Encounter (Signed)
Called patient to inquire about this request.  In reviewing [name]'s notes from 10/07/12, it appears [name] is due to have hydrocodone/IBU fill again 11/17/12.   It appear patient was suppose have appt 11/11/12 but according to him his clock was wrong and he came too early to appt and could not wait.  He also states he is going out of town and would like to get prescription for pain medication this week - OK to put "not to fill until 11/17/12" on Rx.  I explained to patient that I will need to forward this message to Dr Jacelyn Grip and he will have to approve this request and write Rx for him.

## 2012-11-14 NOTE — Telephone Encounter (Signed)
Patient needs to be seen. Refill denied. Bring all medications at next office visit.

## 2012-11-17 ENCOUNTER — Telehealth: Payer: Self-pay | Admitting: Family Medicine

## 2012-11-18 ENCOUNTER — Other Ambulatory Visit: Payer: Self-pay

## 2012-11-18 MED ORDER — ALPRAZOLAM 0.5 MG PO TABS: 0.5000 mg | ORAL_TABLET | Freq: Every evening | ORAL | Status: DC | PRN

## 2012-11-18 MED ORDER — HYDROCODONE-IBUPROFEN 7.5-200 MG PO TABS: 1.0000 | ORAL_TABLET | Freq: Three times a day (TID) | ORAL | Status: DC | PRN

## 2012-11-18 NOTE — Telephone Encounter (Signed)
Please call in xanax rx with 1 refill

## 2012-11-18 NOTE — Telephone Encounter (Signed)
rx ready for pick up

## 2012-11-18 NOTE — Telephone Encounter (Signed)
Last seen 10/24/12  [facility]  If approved route to nurse to phone into the Drug Store

## 2012-11-18 NOTE — Telephone Encounter (Signed)
Patient aware.

## 2012-11-19 NOTE — Telephone Encounter (Signed)
Done

## 2012-11-20 ENCOUNTER — Ambulatory Visit: Payer: Medicare Other | Admitting: Family Medicine

## 2012-12-03 ENCOUNTER — Encounter: Payer: Self-pay | Admitting: *Deleted

## 2012-12-03 ENCOUNTER — Telehealth: Payer: Self-pay | Admitting: Pulmonary Disease

## 2012-12-03 NOTE — Telephone Encounter (Signed)
I have faxed letter and placed this in the mail to pt. Pt is aware and nothing further needed

## 2012-12-03 NOTE — Telephone Encounter (Signed)
Called spoke with patient who is requesting a letter be faxed AND mailed to him stating that he has sleep apnea and needs to wear his CPAP "so that he won't stop breathing while he sleeps."  Asked patient if this documentation was for his employer or insurance so that we can ensure that we have all the necessary documentation.  Pt stated that he "would rather not say" and left it at that.  Pt would like letter faxed to his personal fax at [phone] and mailed to his verified home address.  Also, recall appt was scheduled with RA for Jan 2015 for 1.21.15 @ 0900 and pt would like an appt card mailed with his letter.  Dr Elsworth Soho please advise if okay to do letter.  Pt is requesting this ASAP and would like a call once completed.  Thank you.

## 2012-12-03 NOTE — Telephone Encounter (Signed)
Ok for letter

## 2012-12-08 ENCOUNTER — Other Ambulatory Visit: Payer: Self-pay

## 2012-12-08 ENCOUNTER — Telehealth: Payer: Self-pay | Admitting: Pharmacist Clinician (PhC)/ Clinical Pharmacy Specialist

## 2012-12-08 DIAGNOSIS — R162 Hepatomegaly with splenomegaly, not elsewhere classified: Secondary | ICD-10-CM

## 2012-12-08 DIAGNOSIS — D696 Thrombocytopenia, unspecified: Secondary | ICD-10-CM

## 2012-12-08 DIAGNOSIS — E785 Hyperlipidemia, unspecified: Secondary | ICD-10-CM

## 2012-12-08 DIAGNOSIS — R161 Splenomegaly, not elsewhere classified: Secondary | ICD-10-CM

## 2012-12-08 DIAGNOSIS — R748 Abnormal levels of other serum enzymes: Secondary | ICD-10-CM

## 2012-12-08 DIAGNOSIS — R160 Hepatomegaly, not elsewhere classified: Secondary | ICD-10-CM

## 2012-12-08 DIAGNOSIS — D72819 Decreased white blood cell count, unspecified: Secondary | ICD-10-CM

## 2012-12-08 MED ORDER — ESOMEPRAZOLE MAGNESIUM 40 MG PO CPDR: 40.0000 mg | DELAYED_RELEASE_CAPSULE | Freq: Every day | ORAL | Status: DC

## 2012-12-08 NOTE — Telephone Encounter (Signed)
Last seen 10/24/12  FPW  IF approved print and route to nurse

## 2012-12-09 MED ORDER — TESTOSTERONE 50 MG/5GM (1%) TD GEL: 5.0000 g | Freq: Every day | TRANSDERMAL | Status: DC

## 2012-12-09 NOTE — Telephone Encounter (Signed)
Rx ready for pick up. 

## 2012-12-09 NOTE — Telephone Encounter (Signed)
Pt notified that rx for testosterone is ready for pick up and is ready for pick up at front desk

## 2012-12-11 ENCOUNTER — Telehealth: Payer: Self-pay | Admitting: Pharmacist

## 2012-12-11 NOTE — Telephone Encounter (Signed)
Tried to call - no ans but left message on VM.

## 2012-12-12 NOTE — Telephone Encounter (Signed)
He has refill from 11/11/20014. Please forward to Tammy. He has 6 days to go. Thanks. Lulu Hirschmann P. Jacelyn Grip, M.D.

## 2012-12-15 ENCOUNTER — Ambulatory Visit (INDEPENDENT_AMBULATORY_CARE_PROVIDER_SITE_OTHER): Payer: Medicare Other | Admitting: Internal Medicine

## 2012-12-15 ENCOUNTER — Encounter (INDEPENDENT_AMBULATORY_CARE_PROVIDER_SITE_OTHER): Payer: Self-pay | Admitting: Internal Medicine

## 2012-12-15 VITALS — BP 140/100 | HR 78 | Temp 97.0°F | Resp 20 | Ht 72.0 in | Wt >= 6400 oz

## 2012-12-15 DIAGNOSIS — D696 Thrombocytopenia, unspecified: Secondary | ICD-10-CM

## 2012-12-15 DIAGNOSIS — K7460 Unspecified cirrhosis of liver: Secondary | ICD-10-CM

## 2012-12-15 NOTE — Patient Instructions (Addendum)
Physician will contact you with results of blood work and ultrasound when completed

## 2012-12-15 NOTE — Progress Notes (Signed)
Presenting complaint;  Followup for cirrhosis.  Database;  [name] is 47 year old Caucasian male who has who has history of cirrhosis secondary to NAFLD and prior alcohol use. He had liver biopsy in December 2012 revealing stage IV fibrosis. He quit drinking alcohol several months ago. Viral markers for hepatitis B and C. have been negative. Patient underwent EGD on 02/28/2011 revealing 3 columns of grade 1 esophageal varices and changes of portal gastropathy. He underwent colonoscopy on 05/08/2011 because of rectal bleeding. All together 4 polyps were removed 2 via cold biopsy and two were snared. 3 of these polyps were tubular adenomas. He also had external hemorrhoids and mild sigmoid diverticulosis. Patient underwent another EGD on 06/11/2012 because of history of melena and noted to have 2 short columns of grade 1 esophageal varices and portal gastropathy. Patient was advised to keep Vicoprofen used a minimum. Last ultrasound was in November 2012 revealing hepatomegaly and splenomegaly. AFP was 5.1 on 06/11/2012  Subjective:  Patient is here for scheduled visit. He was last seen 6 months ago. He has not been able to lose any weight. He states he's eating small meals. He is not able to walk or exercise because of cost and muscle pain in both thighs. He states he cannot afford to join [facility] and similarly he he states he cannot afford to see a dietitian. He says his heart rate but controlled with therapy. His bowels move daily. He denies melena rectal bleeding nausea or vomiting. He has gained 11 pounds since his last visit of 05/27/2012.  He states umbilicus hernia is enlarging and skin is turning blue. He has an appointment to see one of the surgeons at [place] in [facility]       Current Medications: Current Outpatient Prescriptions  Medication Sig Dispense Refill  . albuterol (PROVENTIL HFA;VENTOLIN HFA) 108 (90 BASE) MCG/ACT inhaler Inhale 2 puffs into  the lungs every 6 (six) hours as needed. For shortness of breath      . allopurinol (ZYLOPRIM) 300 MG tablet Take 1 tablet (300 mg total) by mouth daily.  30 tablet  6  . ALPRAZolam (XANAX) 0.5 MG tablet Take 1 tablet (0.5 mg total) by mouth at bedtime as needed. Pt states he takes usually 2 sometimes 3 per night  60 tablet  1  . aspirin EC 81 MG tablet Take 81 mg by mouth once as needed.       . COLCRYS 0.6 MG tablet Take 0.6 mg by mouth as needed. For gout      . diltiazem (CARDIZEM) 90 MG tablet Take 1 tablet (90 mg total) by mouth 3 (three) times daily.  90 tablet  2  . doxepin (SINEQUAN) 10 MG capsule Take 1 capsule (10 mg total) by mouth at bedtime.  30 capsule  2  . esomeprazole (NEXIUM) 40 MG capsule Take 1 capsule (40 mg total) by mouth daily.  30 capsule  1  . ezetimibe (ZETIA) 10 MG tablet Take 1 tablet (10 mg total) by mouth daily.  30 tablet  5  . fluticasone (FLONASE) 50 MCG/ACT nasal spray Place 2 sprays into both nostrils daily as needed. For allergies      . folic acid (FOLVITE) 008 MCG tablet Take 400 mcg by mouth daily.      Marland Kitchen HYDROcodone-ibuprofen (VICOPROFEN) 7.5-200 MG per tablet Take 1 tablet by mouth every 8 (eight) hours as needed.  90 tablet  0  . hydrocortisone 1 % ointment Apply 1 application topically 3 (three)  times daily as needed. Patient states that this is a 2% Oint.      Marland Kitchen ipratropium (ATROVENT) 0.02 % nebulizer solution Take 500 mcg by nebulization 4 (four) times daily as needed. For wheezing      . lidocaine (XYLOCAINE) 5 % ointment Apply 1 application topically 4 (four) times daily as needed. To knees and ankle as needed for pain      . Menthol-Methyl Salicylate (MUSCLE RUB) 10-15 % CREA Apply 1 application topically as needed. For muscle pain      . milk thistle 175 MG tablet Take 175 mg by mouth 4 (four) times daily.       . Multiple Vitamin (MULITIVITAMIN WITH MINERALS) TABS Take 1 tablet by mouth daily.      Marland Kitchen neomycin-bacitracin-polymyxin (NEOSPORIN)  ointment Apply 1 application topically every 12 (twelve) hours as needed. For cut on ankle      . nitroGLYCERIN (NITROSTAT) 0.4 MG SL tablet Place 0.4 mg under the tongue every 5 (five) minutes as needed for chest pain (may repeat x3).      . phenylephrine (NASAL DECONGESTANT) 1 % nasal spray Place 1 drop into the nose every 4 (four) hours as needed. For allergies      . potassium chloride (MICRO-K) 10 MEQ CR capsule Take 1 capsule (10 mEq total) by mouth daily.  30 capsule  5  . pregabalin (LYRICA) 300 MG capsule 2 (two) times daily. One in am and two and night      . testosterone (ANDROGEL) 50 MG/5GM GEL Place 5 g onto the skin daily.  5 g  0  . torsemide (DEMADEX) 20 MG tablet 2 tabs in the morning and 1 tab 6 hours later      . vitamin B-12 (CYANOCOBALAMIN) 100 MCG tablet Take 50 mcg by mouth daily.       No current facility-administered medications for this visit.     Objective: Blood pressure 140/100, pulse 78, temperature 97 F (36.1 C), temperature source Oral, resp. rate 20, height 6' (1.829 m), weight 412 lb (186.882 kg). Patient is alert and in no acute distress. He does not have asterixis. Conjunctiva is pink. Sclera is nonicteric Oropharyngeal mucosa is normal. No neck masses or thyromegaly noted. Cardiac exam with regular rhythm normal S1 and S2. No murmur or gallop noted. Lungs are clear to auscultation. Abdomen is obese with umbilical hernia with slight bluish discoloration th skin but no ulcer noted. This hernia is soft and nontender difficult to palpate liver or spleen in sitting position to  He has 2+ pitting edema involving both legs up to the level of needs.  Labs/studies Results: Lab data from 08/19/2012. WBC 2.5, H&H 14.5 and 43.4 and platelet count 48K Electrolytes within normal limits. Glucose 108, BUN 11, creatinine 0.7 Bilirubin 0.6, AP 65, AST 60, ALT 43 and albumin 4.5   Assessment:  #1. Cirrhosis secondary to NAFLD and history of prior alcohol use. He  has quit drinking alcohol but unfortunately he has had no success in losing weight. Long-term prognosis remains very poor. He needs to be screened for [facility]. #2. Leukopenia and thrombocytopenia secondary to cirrhosis and splenomegaly. He previously has been evaluated by Dr. Marcene Duos. #3. GERD. Symptoms well controlled with therapy.    Plan:  Patient will have AFP with next blood draw. Hepatic ultrasound primarily for screening purpose. Patient offered consultation with dietitian in order to help him lose weight but he is not interested. Will review old records to determine if he  has received vaccination for hepatitis A and B. Office visit in 6 month.

## 2012-12-16 ENCOUNTER — Other Ambulatory Visit: Payer: Self-pay | Admitting: Pharmacist Clinician (PhC)/ Clinical Pharmacy Specialist

## 2012-12-16 DIAGNOSIS — F411 Generalized anxiety disorder: Secondary | ICD-10-CM

## 2012-12-16 DIAGNOSIS — M549 Dorsalgia, unspecified: Secondary | ICD-10-CM

## 2012-12-16 MED ORDER — ALPRAZOLAM 0.5 MG PO TABS: 0.5000 mg | ORAL_TABLET | Freq: Every evening | ORAL | Status: DC | PRN

## 2012-12-16 MED ORDER — HYDROCODONE-IBUPROFEN 7.5-200 MG PO TABS: 1.0000 | ORAL_TABLET | Freq: Three times a day (TID) | ORAL | Status: DC | PRN

## 2012-12-16 NOTE — Telephone Encounter (Signed)
Spoke with [facility] today.  He will be out of his medications tomorrow.  They were last filled on 11/11 for a 30 day supply for both alprazolam and hydrocodone/APAP.  Patient requests qid not tid on hydrocodone.  I informed patient that decision is up to Dr. Jacelyn Grip.  The current prescription is still for tid.  He states that he is in considerable pain taking it tid versus qid.  Prescriptions are prepared for Dr. Jacelyn Grip to sign.

## 2012-12-16 NOTE — Telephone Encounter (Signed)
Pt notifed rx for alprazolam and hydrocodone -ibuprofen  Ready for pick up  Per dr Jacelyn Grip

## 2012-12-19 ENCOUNTER — Telehealth: Payer: Self-pay | Admitting: Family Medicine

## 2012-12-19 NOTE — Telephone Encounter (Signed)
[name] CALLED BACK TO LET us KNOW THE PHARMACY MADE THE MED REFILL ERROR AND THEY ARE CORRECTING IT.  RS

## 2013-01-07 ENCOUNTER — Telehealth: Payer: Self-pay | Admitting: Family Medicine

## 2013-01-09 ENCOUNTER — Encounter: Payer: Self-pay | Admitting: Family Medicine

## 2013-01-09 ENCOUNTER — Ambulatory Visit (INDEPENDENT_AMBULATORY_CARE_PROVIDER_SITE_OTHER): Payer: Medicare Other | Admitting: Family Medicine

## 2013-01-09 VITALS — BP 130/85 | HR 74 | Temp 97.8°F | Ht 72.0 in | Wt >= 6400 oz

## 2013-01-09 DIAGNOSIS — M109 Gout, unspecified: Secondary | ICD-10-CM

## 2013-01-09 DIAGNOSIS — E119 Type 2 diabetes mellitus without complications: Secondary | ICD-10-CM

## 2013-01-09 DIAGNOSIS — R748 Abnormal levels of other serum enzymes: Secondary | ICD-10-CM

## 2013-01-09 DIAGNOSIS — R160 Hepatomegaly, not elsewhere classified: Secondary | ICD-10-CM

## 2013-01-09 DIAGNOSIS — I2789 Other specified pulmonary heart diseases: Secondary | ICD-10-CM

## 2013-01-09 DIAGNOSIS — E785 Hyperlipidemia, unspecified: Secondary | ICD-10-CM

## 2013-01-09 DIAGNOSIS — I2510 Atherosclerotic heart disease of native coronary artery without angina pectoris: Secondary | ICD-10-CM

## 2013-01-09 DIAGNOSIS — G4733 Obstructive sleep apnea (adult) (pediatric): Secondary | ICD-10-CM

## 2013-01-09 DIAGNOSIS — G4700 Insomnia, unspecified: Secondary | ICD-10-CM

## 2013-01-09 DIAGNOSIS — F411 Generalized anxiety disorder: Secondary | ICD-10-CM

## 2013-01-09 DIAGNOSIS — D696 Thrombocytopenia, unspecified: Secondary | ICD-10-CM

## 2013-01-09 DIAGNOSIS — E291 Testicular hypofunction: Secondary | ICD-10-CM

## 2013-01-09 DIAGNOSIS — M21169 Varus deformity, not elsewhere classified, unspecified knee: Secondary | ICD-10-CM

## 2013-01-09 DIAGNOSIS — J45909 Unspecified asthma, uncomplicated: Secondary | ICD-10-CM

## 2013-01-09 DIAGNOSIS — K7689 Other specified diseases of liver: Secondary | ICD-10-CM

## 2013-01-09 DIAGNOSIS — D72819 Decreased white blood cell count, unspecified: Secondary | ICD-10-CM

## 2013-01-09 DIAGNOSIS — R161 Splenomegaly, not elsewhere classified: Secondary | ICD-10-CM

## 2013-01-09 DIAGNOSIS — R162 Hepatomegaly with splenomegaly, not elsewhere classified: Secondary | ICD-10-CM

## 2013-01-09 DIAGNOSIS — I4891 Unspecified atrial fibrillation: Secondary | ICD-10-CM

## 2013-01-09 DIAGNOSIS — M549 Dorsalgia, unspecified: Secondary | ICD-10-CM

## 2013-01-09 DIAGNOSIS — I5032 Chronic diastolic (congestive) heart failure: Secondary | ICD-10-CM

## 2013-01-09 DIAGNOSIS — K7460 Unspecified cirrhosis of liver: Secondary | ICD-10-CM

## 2013-01-09 LAB — POCT GLYCOSYLATED HEMOGLOBIN (HGB A1C): Hemoglobin A1C: 5.8

## 2013-01-09 LAB — POCT CBC
Granulocyte percent: 55.7 %G (ref 37–80)
HCT, POC: 48.7 % (ref 43.5–53.7)
Hemoglobin: 15.1 g/dL (ref 14.1–18.1)
Lymph, poc: 1.1 (ref 0.6–3.4)
MCH, POC: 26.4 pg — AB (ref 27–31.2)
MCHC: 31 g/dL — AB (ref 31.8–35.4)
MCV: 85.3 fL (ref 80–97)
MPV: 8.8 fL (ref 0–99.8)
POC Granulocyte: 1.7 — AB (ref 2–6.9)
POC LYMPH PERCENT: 37.1 %L (ref 10–50)
Platelet Count, POC: 40 10*3/uL — AB (ref 142–424)
RBC: 5.7 M/uL (ref 4.69–6.13)
RDW, POC: 15 %
WBC: 3 10*3/uL — AB (ref 4.6–10.2)

## 2013-01-09 MED ORDER — HYDROCORTISONE 1 % EX OINT: 1.0000 "application " | TOPICAL_OINTMENT | Freq: Three times a day (TID) | CUTANEOUS | Status: DC | PRN

## 2013-01-09 MED ORDER — EZETIMIBE 10 MG PO TABS: 10.0000 mg | ORAL_TABLET | Freq: Every day | ORAL | Status: DC

## 2013-01-09 MED ORDER — COLCHICINE 0.6 MG PO TABS: 0.6000 mg | ORAL_TABLET | ORAL | Status: DC | PRN

## 2013-01-09 MED ORDER — VITAMIN B-12 100 MCG PO TABS: 50.0000 ug | ORAL_TABLET | Freq: Every day | ORAL | Status: DC

## 2013-01-09 MED ORDER — ALLOPURINOL 300 MG PO TABS: 300.0000 mg | ORAL_TABLET | Freq: Every day | ORAL | Status: DC

## 2013-01-09 MED ORDER — TESTOSTERONE 50 MG/5GM (1%) TD GEL: 5.0000 g | Freq: Every day | TRANSDERMAL | Status: DC

## 2013-01-09 MED ORDER — DILTIAZEM HCL 90 MG PO TABS: 90.0000 mg | ORAL_TABLET | Freq: Three times a day (TID) | ORAL | Status: DC

## 2013-01-09 MED ORDER — PREGABALIN 300 MG PO CAPS: 300.0000 mg | ORAL_CAPSULE | Freq: Two times a day (BID) | ORAL | Status: DC

## 2013-01-09 MED ORDER — POTASSIUM CHLORIDE ER 10 MEQ PO CPCR: 10.0000 meq | ORAL_CAPSULE | Freq: Every day | ORAL | Status: DC

## 2013-01-09 MED ORDER — ALPRAZOLAM 0.5 MG PO TABS: 0.5000 mg | ORAL_TABLET | Freq: Every evening | ORAL | Status: DC | PRN

## 2013-01-09 MED ORDER — NITROGLYCERIN 0.4 MG SL SUBL: 0.4000 mg | SUBLINGUAL_TABLET | SUBLINGUAL | Status: DC | PRN

## 2013-01-09 MED ORDER — DOXEPIN HCL 10 MG PO CAPS: 10.0000 mg | ORAL_CAPSULE | Freq: Every day | ORAL | Status: DC

## 2013-01-09 MED ORDER — LIDOCAINE 5 % EX OINT: 1.0000 "application " | TOPICAL_OINTMENT | Freq: Four times a day (QID) | CUTANEOUS | Status: DC | PRN

## 2013-01-09 MED ORDER — FLUTICASONE PROPIONATE 50 MCG/ACT NA SUSP: 2.0000 | Freq: Every day | NASAL | Status: DC | PRN

## 2013-01-09 MED ORDER — HYDROCODONE-IBUPROFEN 7.5-200 MG PO TABS: 1.0000 | ORAL_TABLET | Freq: Three times a day (TID) | ORAL | Status: DC | PRN

## 2013-01-09 MED ORDER — TORSEMIDE 20 MG PO TABS: 20.0000 mg | ORAL_TABLET | ORAL | Status: DC

## 2013-01-09 MED ORDER — ESOMEPRAZOLE MAGNESIUM 40 MG PO CPDR: 40.0000 mg | DELAYED_RELEASE_CAPSULE | Freq: Every day | ORAL | Status: DC

## 2013-01-09 NOTE — Telephone Encounter (Signed)
Pt here today for office visit with dr Jacelyn Grip.

## 2013-01-09 NOTE — Patient Instructions (Signed)
      Dr Dusten Ellinwood's Recommendations  For nutrition information, I recommend books:  1).Eat to Live by Dr Joel Fuhrman. 2).Prevent and Reverse Heart Disease by Dr Caldwell Esselstyn. 3) Dr Neal Barnard's Book:  Program to Reverse Diabetes  Exercise recommendations are:  If unable to walk, then the patient can exercise in a chair 3 times a day. By flapping arms like a bird gently and raising legs outwards to the front.  If ambulatory, the patient can go for walks for 30 minutes 3 times a week. Then increase the intensity and duration as tolerated.  Goal is to try to attain exercise frequency to 5 times a week.  If applicable: Best to perform resistance exercises (machines or weights) 2 days a week and cardio type exercises 3 days per week.  

## 2013-01-09 NOTE — Telephone Encounter (Signed)
Pt seen today with dr Jacelyn Grip and refills discuissed

## 2013-01-09 NOTE — Progress Notes (Signed)
Patient ID: [name], male   DOB: 08/07/1965, 47 y.o.   MRN: [id number] SUBJECTIVE: CC: Chief Complaint  Patient presents with  . Follow-up    3 month wants refills on all meds    HPI: Patient is here for follow up of Diabetes Mellitus/cirrhosis/morbid obesity/chronic pain/: Symptoms evaluated: Denies Nocturia ,Denies Urinary Frequency , denies Blurred vision ,deniesDizziness,denies.Dysuria,denies paresthesias, denies extremity pain or ulcers..denies chest pain. has had an annual eye exam. do check the feet. Does check CBGs. Average CBG:fluctuates Denies episodes of hypoglycemia. Does have an emergency hypoglycemic plan. admits toCompliance with medications. Denies Problems with medications.  Past Medical History  Diagnosis Date  . Gout   . Diabetes mellitus     type 2  . RA (rheumatoid arthritis)   . COPD (chronic obstructive pulmonary disease)     with cpap  . Atrial fibrillation   . Cirrhosis   . Anxiety   . Heart murmur   . CHF (congestive heart failure)   . Constipation   . Sleep apnea     cpap used- level 10 and greater  . Neuromuscular disorder     neuropathy in hands and feet  . Cirrhosis     NASH  . Thrombocytopenia   . Genu varus    Past Surgical History  Procedure Laterality Date  . Sciatic nerve exploration    . Tonsillectomy    . Esophagogastroduodenoscopy  02/28/2011    Procedure: ESOPHAGOGASTRODUODENOSCOPY (EGD);  Surgeon: Najeeb U Rehman, MD;  Location: [facility];  Service: Endoscopy;  Laterality: N/A;  1200  . Colonoscopy  05/08/2011    Procedure: COLONOSCOPY;  Surgeon: Najeeb U Rehman, MD;  Location: [facility];  Service: Endoscopy;  Laterality: N/A;  730  . Hernia repair      as child  . Liver biopsy  2012  . Ankle surgery      right ankle talor repair  . Esophagogastroduodenoscopy N/A 06/11/2012    Procedure: ESOPHAGOGASTRODUODENOSCOPY (EGD);  Surgeon: Najeeb U Rehman, MD;  Location: [facility];  Service: Endoscopy;   Laterality: N/A;  1200  FYI patient is 400 pounds   History   Social History  . Marital Status: Divorced    Spouse Name: N/A    Number of Children: N/A  . Years of Education: N/A   Occupational History  . Not on file.   Social History Main Topics  . Smoking status: Never Smoker   . Smokeless tobacco: Current User    Types: Snuff, Chew     Comment: Pt reports that he "dips"  . Alcohol Use: 0.6 oz/week    1 Cans of beer per week  . Drug Use: No     Comment: Use to smoke cocaine and marijuana. No IV drug use  . Sexual Activity: Yes   Other Topics Concern  . Not on file   Social History Narrative  . No narrative on file   Family History  Problem Relation Age of Onset  . Colon cancer Mother   . Cancer Mother     intestine  . Cancer Father     throat  . Cancer Brother     brain  . Cancer Sister    Current Outpatient Prescriptions on File Prior to Visit  Medication Sig Dispense Refill  . albuterol (PROVENTIL HFA;VENTOLIN HFA) 108 (90 BASE) MCG/ACT inhaler Inhale 2 puffs into the lungs every 6 (six) hours as needed. For shortness of breath      . aspirin EC 81   MG tablet Take 81 mg by mouth once as needed.       . folic acid (FOLVITE) 400 MCG tablet Take 400 mcg by mouth daily.      . ipratropium (ATROVENT) 0.02 % nebulizer solution Take 500 mcg by nebulization 4 (four) times daily as needed. For wheezing      . Menthol-Methyl Salicylate (MUSCLE RUB) 10-15 % CREA Apply 1 application topically as needed. For muscle pain      . milk thistle 175 MG tablet Take 175 mg by mouth 4 (four) times daily.       . Multiple Vitamin (MULITIVITAMIN WITH MINERALS) TABS Take 1 tablet by mouth daily.      . neomycin-bacitracin-polymyxin (NEOSPORIN) ointment Apply 1 application topically every 12 (twelve) hours as needed. For cut on ankle      . phenylephrine (NASAL DECONGESTANT) 1 % nasal spray Place 1 drop into the nose every 4 (four) hours as needed. For allergies       No current  facility-administered medications on file prior to visit.   Allergies  Allergen Reactions  . Oxycodone Base Itching and Nausea And Vomiting  . Prozac [Fluoxetine Hcl] Itching   Immunization History  Administered Date(s) Administered  . Influenza Whole 10/11/2008, 01/09/2010, 11/09/2011  . Influenza,inj,Quad PF,36+ Mos 09/10/2012  . Pneumococcal Polysaccharide-23 09/08/2008  . Td 01/09/2008   Prior to Admission medications   Medication Sig Start Date End Date Taking? Authorizing Provider  albuterol (PROVENTIL HFA;VENTOLIN HFA) 108 (90 BASE) MCG/ACT inhaler Inhale 2 puffs into the lungs every 6 (six) hours as needed. For shortness of breath   Yes Historical Provider, MD  allopurinol (ZYLOPRIM) 300 MG tablet Take 1 tablet (300 mg total) by mouth daily. 08/19/12  Yes  P , MD  ALPRAZolam (XANAX) 0.5 MG tablet Take 1 tablet (0.5 mg total) by mouth at bedtime as needed. Pt states he takes usually 2 sometimes 3 per night 12/16/12  Yes  P , MD  aspirin EC 81 MG tablet Take 81 mg by mouth once as needed.    Yes Historical Provider, MD  COLCRYS 0.6 MG tablet Take 0.6 mg by mouth as needed. For gout 12/24/11  Yes Historical Provider, MD  diltiazem (CARDIZEM) 90 MG tablet Take 1 tablet (90 mg total) by mouth 3 (three) times daily. 08/19/12  Yes  P , MD  doxepin (SINEQUAN) 10 MG capsule Take 1 capsule (10 mg total) by mouth at bedtime. 09/05/12  Yes  P , MD  esomeprazole (NEXIUM) 40 MG capsule Take 1 capsule (40 mg total) by mouth daily. 12/08/12  Yes Mae E Haliburton, FNP  ezetimibe (ZETIA) 10 MG tablet Take 1 tablet (10 mg total) by mouth daily. 08/19/12  Yes  P , MD  fluticasone (FLONASE) 50 MCG/ACT nasal spray Place 2 sprays into both nostrils daily as needed. For allergies 05/23/11  Yes Historical Provider, MD  folic acid (FOLVITE) 400 MCG tablet Take 400 mcg by mouth daily.   Yes Historical Provider, MD  HYDROcodone-ibuprofen (VICOPROFEN) 7.5-200 MG  per tablet Take 1 tablet by mouth every 8 (eight) hours as needed. 12/16/12  Yes  P , MD  hydrocortisone 1 % ointment Apply 1 application topically 3 (three) times daily as needed. Patient states that this is a 2% Oint.   Yes Historical Provider, MD  ipratropium (ATROVENT) 0.02 % nebulizer solution Take 500 mcg by nebulization 4 (four) times daily as needed. For wheezing   Yes Historical Provider, MD  lidocaine (XYLOCAINE) 5 %   ointment Apply 1 application topically 4 (four) times daily as needed. To knees and ankle as needed for pain 08/08/11  Yes Historical Provider, MD  Menthol-Methyl Salicylate (MUSCLE RUB) 10-15 % CREA Apply 1 application topically as needed. For muscle pain   Yes Historical Provider, MD  milk thistle 175 MG tablet Take 175 mg by mouth 4 (four) times daily.    Yes Historical Provider, MD  Multiple Vitamin (MULITIVITAMIN WITH MINERALS) TABS Take 1 tablet by mouth daily.   Yes Historical Provider, MD  neomycin-bacitracin-polymyxin (NEOSPORIN) ointment Apply 1 application topically every 12 (twelve) hours as needed. For cut on ankle   Yes Historical Provider, MD  nitroGLYCERIN (NITROSTAT) 0.4 MG SL tablet Place 0.4 mg under the tongue every 5 (five) minutes as needed for chest pain (may repeat x3).   Yes Historical Provider, MD  phenylephrine (NASAL DECONGESTANT) 1 % nasal spray Place 1 drop into the nose every 4 (four) hours as needed. For allergies   Yes Historical Provider, MD  potassium chloride (MICRO-K) 10 MEQ CR capsule Take 1 capsule (10 mEq total) by mouth daily. 08/19/12  Yes Vernie Shanks, MD  pregabalin (LYRICA) 300 MG capsule 2 (two) times daily. One in am and two and night 08/19/12  Yes Vernie Shanks, MD  testosterone (ANDROGEL) 50 MG/5GM GEL Place 5 g onto the skin daily. 12/08/12  Yes Vernie Shanks, MD  torsemide (DEMADEX) 20 MG tablet 2 tabs in the morning and 1 tab 6 hours later 08/19/12  Yes Vernie Shanks, MD  vitamin B-12 (CYANOCOBALAMIN) 100 MCG tablet  Take 50 mcg by mouth daily.   Yes Historical Provider, MD     ROS: As above in the HPI. All other systems are stable or negative.  OBJECTIVE: APPEARANCE:  Patient in no acute distress.The patient appeared well nourished and normally developed. Acyanotic. Waist: VITAL SIGNS:BP 130/85  Pulse 74  Temp(Src) 97.8 F (36.6 C) (Oral)  Ht 6' (1.829 m)  Wt 420 lb 3.2 oz (190.601 kg)  BMI 56.98 kg/m2  Morbidly Obese WM with poor habitus. Strong putrid body odor. Unkempt. Dirty clothes  SKIN: warm and  Dry without overt rashes, tattoos and scars  HEAD and Neck: without JVD, Head and scalp: normal Eyes:No scleral icterus. Fundi normal, eye movements normal. Ears: Auricle normal, canal normal, Tympanic membranes normal, insufflation normal. Nose: normal Throat: normal Neck & thyroid: normal  CHEST & LUNGS: Chest wall: normal Lungs: Clear  CVS: Reveals the PMI to be normally located. Regular rhythm, First and Second Heart sounds are normal,  absence of murmurs, rubs or gallops. Peripheral vasculature: Radial pulses: normal Dorsal pedis pulses: normal Posterior pulses: normal  ABDOMEN:  Appearance: morbidly obese Benign, no organomegaly, no masses, no Abdominal Aortic enlargement. No Guarding , no rebound. No Bruits. Bowel sounds: normal  RECTAL: N/A GU: N/A  EXTREMETIES: nonedematous.  MUSCULOSKELETAL:  Spine: chronic back pain. Reduced ROM Joints: hips and knees, reduced ROM and crepitus of the knees.  NEUROLOGIC: oriented to,place and person; nonfocal.   ASSESSMENT:  THROMBOCYTOPENIA - Plan: vitamin B-12 (CYANOCOBALAMIN) 100 MCG tablet, POCT CBC, Vitamin B12  Hypogonadism male - Plan: Testosterone,Free and Total, PSA, total and free  GOUT - Plan: allopurinol (ZYLOPRIM) 300 MG tablet, Uric acid  Genu varus, unspecified laterality  SLEEP APNEA, OBSTRUCTIVE  OBESITY, MORBID  HYPERTENSION, PULMONARY - Plan: diltiazem (CARDIZEM) 90 MG tablet, potassium  chloride (MICRO-K) 10 MEQ CR capsule  DIASTOLIC HEART FAILURE, CHRONIC - Plan: CMP14+EGFR  DIABETES MELLITUS - Plan:  POCT glycosylated hemoglobin (Hb A1C), CMP14+EGFR, CANCELED: POCT UA - Microalbumin, CANCELED: POCT UA - Microalbumin  Cirrhosis - Plan: AFP tumor marker, AFP tumor marker  CAD - Plan: diltiazem (CARDIZEM) 90 MG tablet, ezetimibe (ZETIA) 10 MG tablet  Atrial fibrillation - Plan: diltiazem (CARDIZEM) 90 MG tablet, potassium chloride (MICRO-K) 10 MEQ CR capsule  Asthma  Back pain - Plan: HYDROcodone-ibuprofen (VICOPROFEN) 7.5-200 MG per tablet  Hepatosplenomegaly - Plan: testosterone (ANDROGEL) 50 MG/5GM GEL  Elevated liver enzymes - Plan: testosterone (ANDROGEL) 50 MG/5GM GEL  Enlarged liver - Plan: testosterone (ANDROGEL) 50 MG/5GM GEL  Thrombocytopenia - Plan: testosterone (ANDROGEL) 50 MG/5GM GEL  Leukopenia - Plan: testosterone (ANDROGEL) 50 MG/5GM GEL  Splenomegaly - Plan: testosterone (ANDROGEL) 50 MG/5GM GEL  Hyperlipemia - Plan: testosterone (ANDROGEL) 50 MG/5GM GEL, NMR, lipoprofile  Generalized anxiety disorder - Plan: ALPRAZolam (XANAX) 0.5 MG tablet  Insomnia - Plan: doxepin (SINEQUAN) 10 MG capsule  PLAN:        Dr Paula Libra Recommendations  For nutrition information, I recommend books:  1).Eat to Live by Dr Excell Seltzer. 2).Prevent and Reverse Heart Disease by Dr Karl Luke. 3) Dr Janene Harvey Book:  Program to Reverse Diabetes  Exercise recommendations are:  If unable to walk, then the patient can exercise in a chair 3 times a day. By flapping arms like a bird gently and raising legs outwards to the front.  If ambulatory, the patient can go for walks for 30 minutes 3 times a week. Then increase the intensity and duration as tolerated.  Goal is to try to attain exercise frequency to 5 times a week.  If applicable: Best to perform resistance exercises (machines or weights) 2 days a week and cardio type exercises 3 days per  week.   Healthier choices and lifestyle changes discussed, however, patient is a challenge with poor will power , poor motivation.   Orders Placed This Encounter  Procedures  . AFP tumor marker    Standing Status: Standing     Number of Occurrences: 1     Standing Expiration Date:   . CMP14+EGFR  . NMR, lipoprofile  . Uric acid  . Vitamin B12  . Testosterone,Free and Total  . PSA, total and free  . POCT CBC  . POCT glycosylated hemoglobin (Hb A1C)   Meds ordered this encounter  Medications  . diltiazem (CARDIZEM) 90 MG tablet    Sig: Take 1 tablet (90 mg total) by mouth 3 (three) times daily.    Dispense:  90 tablet    Refill:  5  . potassium chloride (MICRO-K) 10 MEQ CR capsule    Sig: Take 1 capsule (10 mEq total) by mouth daily.    Dispense:  30 capsule    Refill:  5  . HYDROcodone-ibuprofen (VICOPROFEN) 7.5-200 MG per tablet    Sig: Take 1 tablet by mouth every 8 (eight) hours as needed.    Dispense:  90 tablet    Refill:  0    Order Specific Question:  Supervising Provider    Answer:  Chipper Herb [[id number]]  . ezetimibe (ZETIA) 10 MG tablet    Sig: Take 1 tablet (10 mg total) by mouth daily.    Dispense:  30 tablet    Refill:  5  . testosterone (ANDROGEL) 50 MG/5GM GEL    Sig: Place 5 g onto the skin daily.    Dispense:  5 g    Refill:  5  . ALPRAZolam (XANAX) 0.5 MG tablet  Sig: Take 1 tablet (0.5 mg total) by mouth at bedtime as needed. Pt states he takes usually 2 sometimes 3 per night    Dispense:  60 tablet    Refill:  0  . allopurinol (ZYLOPRIM) 300 MG tablet    Sig: Take 1 tablet (300 mg total) by mouth daily.    Dispense:  30 tablet    Refill:  6  . doxepin (SINEQUAN) 10 MG capsule    Sig: Take 1 capsule (10 mg total) by mouth at bedtime.    Dispense:  30 capsule    Refill:  5  . colchicine (COLCRYS) 0.6 MG tablet    Sig: Take 1 tablet (0.6 mg total) by mouth as needed. For gout    Dispense:  30 tablet    Refill:  1  . esomeprazole (NEXIUM)  40 MG capsule    Sig: Take 1 capsule (40 mg total) by mouth daily.    Dispense:  30 capsule    Refill:  5  . fluticasone (FLONASE) 50 MCG/ACT nasal spray    Sig: Place 2 sprays into both nostrils daily as needed. For allergies    Dispense:  1 g    Refill:  5  . hydrocortisone 1 % ointment    Sig: Apply 1 application topically 3 (three) times daily as needed. Patient states that this is a 2% Oint.    Dispense:  30 g    Refill:  1  . lidocaine (XYLOCAINE) 5 % ointment    Sig: Apply 1 application topically 4 (four) times daily as needed. To knees and ankle as needed for pain    Dispense:  35.44 g    Refill:  2  . nitroGLYCERIN (NITROSTAT) 0.4 MG SL tablet    Sig: Place 1 tablet (0.4 mg total) under the tongue every 5 (five) minutes as needed for chest pain (may repeat x3).    Dispense:  10 tablet    Refill:  2  . pregabalin (LYRICA) 300 MG capsule    Sig: Take 1 capsule (300 mg total) by mouth 2 (two) times daily. One in am and two and night    Dispense:  60 capsule    Refill:  5  . torsemide (DEMADEX) 20 MG tablet    Sig: Take 1 tablet (20 mg total) by mouth as directed. 2 tabs in the morning and 1 tab 6 hours later    Dispense:  90 tablet    Refill:  5  . vitamin B-12 (CYANOCOBALAMIN) 100 MCG tablet    Sig: Take 0.5 tablets (50 mcg total) by mouth daily.    Dispense:  30 tablet    Refill:  11   Medications Discontinued During This Encounter  Medication Reason  . diltiazem (CARDIZEM) 90 MG tablet Reorder  . potassium chloride (MICRO-K) 10 MEQ CR capsule Reorder  . HYDROcodone-ibuprofen (VICOPROFEN) 7.5-200 MG per tablet Reorder  . ezetimibe (ZETIA) 10 MG tablet Reorder  . testosterone (ANDROGEL) 50 MG/5GM GEL Reorder  . ALPRAZolam (XANAX) 0.5 MG tablet Reorder  . allopurinol (ZYLOPRIM) 300 MG tablet Reorder  . doxepin (SINEQUAN) 10 MG capsule Reorder  . COLCRYS 0.6 MG tablet Reorder  . esomeprazole (NEXIUM) 40 MG capsule Reorder  . fluticasone (FLONASE) 50 MCG/ACT nasal  spray Reorder  . hydrocortisone 1 % ointment Reorder  . lidocaine (XYLOCAINE) 5 % ointment Reorder  . nitroGLYCERIN (NITROSTAT) 0.4 MG SL tablet Reorder  . pregabalin (LYRICA) 300 MG capsule Reorder  . torsemide (DEMADEX) 20 MG   tablet Reorder  . vitamin B-12 (CYANOCOBALAMIN) 100 MCG tablet Reorder   Return in about 4 months (around 05/09/2013) for Recheck medical problems.  Kailynn Satterly P. Jacelyn Grip, M.D.

## 2013-01-11 LAB — CMP14+EGFR
ALT: 41 IU/L (ref 0–44)
AST: 49 IU/L — ABNORMAL HIGH (ref 0–40)
Albumin/Globulin Ratio: 1.5 (ref 1.1–2.5)
Albumin: 4.4 g/dL (ref 3.5–5.5)
Alkaline Phosphatase: 70 IU/L (ref 39–117)
BUN/Creatinine Ratio: 11 (ref 9–20)
BUN: 9 mg/dL (ref 6–24)
CO2: 23 mmol/L (ref 18–29)
Calcium: 9.3 mg/dL (ref 8.7–10.2)
Chloride: 100 mmol/L (ref 97–108)
Creatinine, Ser: 0.84 mg/dL (ref 0.76–1.27)
GFR calc Af Amer: 119 mL/min/{1.73_m2} (ref >59)
GFR calc non Af Amer: 103 mL/min/{1.73_m2} (ref >59)
Globulin, Total: 2.9 g/dL (ref 1.5–4.5)
Glucose: 102 mg/dL — ABNORMAL HIGH (ref 65–99)
Potassium: 4.3 mmol/L (ref 3.5–5.2)
Sodium: 140 mmol/L (ref 134–144)
Total Bilirubin: 0.9 mg/dL (ref 0.0–1.2)
Total Protein: 7.3 g/dL (ref 6.0–8.5)

## 2013-01-11 LAB — VITAMIN B12: Vitamin B-12: 647 pg/mL (ref 211–946)

## 2013-01-11 LAB — NMR, LIPOPROFILE
Cholesterol: 151 mg/dL (ref <200)
HDL Cholesterol by NMR: 38 mg/dL — ABNORMAL LOW (ref >=40)
HDL Particle Number: 26.9 umol/L — ABNORMAL LOW (ref >=30.5)
LDL Particle Number: 1205 nmol/L — ABNORMAL HIGH (ref <1000)
LDL Size: 21.1 nm (ref >20.5)
LDLC SERPL CALC-MCNC: 78 mg/dL (ref <100)
LP-IR Score: 74 — ABNORMAL HIGH (ref <=45)
Small LDL Particle Number: 161 nmol/L (ref <=527)
Triglycerides by NMR: 175 mg/dL — ABNORMAL HIGH (ref <150)

## 2013-01-11 LAB — TESTOSTERONE,FREE AND TOTAL
Testosterone, Free: 4.7 pg/mL — ABNORMAL LOW (ref 6.8–21.5)
Testosterone: 523 ng/dL (ref 348–1197)

## 2013-01-11 LAB — PSA, TOTAL AND FREE
PSA, Free Pct: 46.7 %
PSA, Free: 0.14 ng/mL
PSA: 0.3 ng/mL (ref 0.0–4.0)

## 2013-01-11 LAB — URIC ACID: Uric Acid: 8.1 mg/dL (ref 3.7–8.6)

## 2013-01-14 ENCOUNTER — Other Ambulatory Visit: Payer: Self-pay | Admitting: Family Medicine

## 2013-01-14 DIAGNOSIS — M109 Gout, unspecified: Secondary | ICD-10-CM

## 2013-01-14 MED ORDER — ALLOPURINOL 300 MG PO TABS: 600.0000 mg | ORAL_TABLET | Freq: Every day | ORAL | Status: DC

## 2013-01-28 ENCOUNTER — Ambulatory Visit: Payer: Medicare Other | Admitting: Pulmonary Disease

## 2013-02-19 ENCOUNTER — Ambulatory Visit (INDEPENDENT_AMBULATORY_CARE_PROVIDER_SITE_OTHER): Payer: Medicare Other

## 2013-02-19 ENCOUNTER — Ambulatory Visit (INDEPENDENT_AMBULATORY_CARE_PROVIDER_SITE_OTHER): Payer: Medicare Other | Admitting: Family Medicine

## 2013-02-19 ENCOUNTER — Encounter: Payer: Self-pay | Admitting: Family Medicine

## 2013-02-19 VITALS — BP 149/96 | HR 100 | Temp 97.6°F | Ht 72.0 in | Wt >= 6400 oz

## 2013-02-19 DIAGNOSIS — I2789 Other specified pulmonary heart diseases: Secondary | ICD-10-CM

## 2013-02-19 DIAGNOSIS — I4891 Unspecified atrial fibrillation: Secondary | ICD-10-CM

## 2013-02-19 DIAGNOSIS — I5032 Chronic diastolic (congestive) heart failure: Secondary | ICD-10-CM

## 2013-02-19 DIAGNOSIS — M542 Cervicalgia: Secondary | ICD-10-CM | POA: Insufficient documentation

## 2013-02-19 DIAGNOSIS — I2510 Atherosclerotic heart disease of native coronary artery without angina pectoris: Secondary | ICD-10-CM

## 2013-02-19 DIAGNOSIS — G4733 Obstructive sleep apnea (adult) (pediatric): Secondary | ICD-10-CM

## 2013-02-19 DIAGNOSIS — Z789 Other specified health status: Secondary | ICD-10-CM | POA: Insufficient documentation

## 2013-02-19 DIAGNOSIS — K7460 Unspecified cirrhosis of liver: Secondary | ICD-10-CM

## 2013-02-19 DIAGNOSIS — D696 Thrombocytopenia, unspecified: Secondary | ICD-10-CM

## 2013-02-19 DIAGNOSIS — M109 Gout, unspecified: Secondary | ICD-10-CM

## 2013-02-19 DIAGNOSIS — E119 Type 2 diabetes mellitus without complications: Secondary | ICD-10-CM

## 2013-02-19 DIAGNOSIS — M21169 Varus deformity, not elsewhere classified, unspecified knee: Secondary | ICD-10-CM

## 2013-02-19 DIAGNOSIS — M549 Dorsalgia, unspecified: Secondary | ICD-10-CM | POA: Insufficient documentation

## 2013-02-19 MED ORDER — HYDROCODONE-IBUPROFEN 7.5-200 MG PO TABS: 1.0000 | ORAL_TABLET | Freq: Three times a day (TID) | ORAL | Status: DC | PRN

## 2013-02-19 NOTE — Patient Instructions (Addendum)
Purine Restricted Diet A low-purine diet consists of foods that reduce uric acid made in your body. INDICATIONS FOR USE  Your caregiver may ask you to follow a low-purine diet to reduce gout flairs.  GUIDELINES  Avoid high-purine foods, including all alcohol, yeast extracts taken as supplements, and sauces made from meats (like gravy). Do not eat high-purine meats, including anchovies, sardines, herring, mussels, tuna, codfish, scallops, trout, haddock, bacon, organ meats, tripe, goose, wild game, and sweetbreads.  Grains  Allowed/Recommended: All, except those listed to consume in moderation.  Consume in Moderation: Oatmeal ( cup uncooked daily), wheat bran or germ ( cup daily), and whole grains. Vegetables  Allowed/Recommended: All, except those listed to consume in moderation.  Consume in Moderation: Asparagus, cauliflower, spinach, mushrooms, and green peas ( cup daily). Fruit  Allowed/Recommended: All.  Consume in Moderation: None. Meat and Meat Substitutes  Allowed/Recommended: Eggs, nuts, and peanut butter.  Consume in Moderation: Limit to 4 to 6 oz daily. Avoid high-purine meats. Lentils, peas, and dried beans (1 cup daily). Milk  Allowed/Recommended: All. Choose low-fat or skim when possible.  Consume in Moderation: None. Fats and Oils  Allowed/Recommended: All.  Consume in Moderation: None. Beverages  Allowed/Recommended: All, except those listed to avoid.  Avoid: All alcohol. Condiments/Miscellaneous  Allowed/Recommended: All, except those listed to consume in moderation.  Consume in Moderation: Bouillon and meat-based broths and soups. Document Released: 04/21/2010 Document Revised: 03/19/2011 Document Reviewed: 04/21/2010 [facility] Patient Information 2014 [place].   Obesity Obesity is defined as having too much total body fat and a body mass index (BMI) of 30 or more. BMI is an estimate of body fat and is calculated from your height and  weight. Obesity happens when you consume more calories than you can burn by exercising or performing daily physical tasks. Prolonged obesity can cause major illnesses or emergencies, such as:   A stroke.  Heart disease.  Diabetes.  Cancer.  Arthritis.  High blood pressure (hypertension).  High cholesterol.  Sleep apnea.  Erectile dysfunction.  Infertility problems. CAUSES   Regularly eating unhealthy foods.  Physical inactivity.  Certain disorders, such as an underactive thyroid (hypothyroidism), Cushing's syndrome, and polycystic ovarian syndrome.  Certain medicines, such as steroids, some depression medicines, and antipsychotics.  Genetics.  Lack of sleep. DIAGNOSIS  A caregiver can diagnose obesity after calculating your BMI. Obesity will be diagnosed if your BMI is 30 or higher.  There are other methods of measuring obesity levels. Some other methods include measuring your skin fold thickness, your waist circumference, and comparing your hip circumference to your waist circumference. TREATMENT  A healthy treatment program includes some or all of the following:  Long-term dietary changes.  Exercise and physical activity.  Behavioral and lifestyle changes.  Medicine only under the supervision of your caregiver. Medicines may help, but only if they are used with diet and exercise programs. An unhealthy treatment program includes:  Fasting.  Fad diets.  Supplements and drugs. These choices do not succeed in long-term weight control.  HOME CARE INSTRUCTIONS   Exercise and perform physical activity as directed by your caregiver. To increase physical activity, try the following:  Use stairs instead of elevators.  Park farther away from store entrances.  Garden, bike, or walk instead of watching television or using the computer.  Eat healthy, low-calorie foods and drinks on a regular basis. Eat more fruits and vegetables. Use low-calorie cookbooks or take  healthy cooking classes.  Limit fast food, sweets, and processed snack foods.  Eat smaller portions.  Keep a daily journal of everything you eat. There are many free websites to help you with this. It may be helpful to measure your foods so you can determine if you are eating the correct portion sizes.  Avoid drinking alcohol. Drink more water and drinks without calories.  Take vitamins and supplements only as recommended by your caregiver.  Weight-loss support groups, Nurse, mental health, counselors, and stress reduction education can also be very helpful. SEEK IMMEDIATE MEDICAL CARE IF:  You have chest pain or tightness.  You have trouble breathing or feel short of breath.  You have weakness or leg numbness.  You feel confused or have trouble talking.  You have sudden changes in your vision. MAKE SURE YOU:  Understand these instructions.  Will watch your condition.  Will get help right away if you are not doing well or get worse. Document Released: 02/02/2004 Document Revised: 06/26/2011 Document Reviewed: 01/31/2011 [facility] Patient Information 2014 [place].    Stop taking and  Do not take colcrys in the future.

## 2013-02-19 NOTE — Progress Notes (Signed)
Patient ID: [name], male   DOB: [birth year], 48 y.o.   MRN: [id number] SUBJECTIVE: CC: Chief Complaint  Patient presents with  . Medication Problem    SEE [employer] [facility] PAPER REGARDING DRUG INTERACTION  . Medication Refill    STATES BEEN OUT OF PAIN MEDS FOR 1 WEEK  AND STATES NECK PAIN  SINCE SUNDAY    HPI:  As above Woke in the night with his neck sore . Cannot turn it without pain and ROM is limited. Hurts out to the shoulders. Also came for follow up early due to the drug interaction warning between Diltiazem and colcrys and the nurse had called him.  Chronic pain and out of pain medications.  Meals still high in sugars and high glycemic foods.  Past Medical History  Diagnosis Date  . Gout   . Diabetes mellitus     type 2  . RA (rheumatoid arthritis)   . COPD (chronic obstructive pulmonary disease)     with cpap  . Atrial fibrillation   . Cirrhosis   . Anxiety   . Heart murmur   . CHF (congestive heart failure)   . Constipation   . Sleep apnea     cpap used- level 10 and greater  . Neuromuscular disorder     neuropathy in hands and feet  . Cirrhosis     NASH  . Thrombocytopenia   . Genu varus    Past Surgical History  Procedure Laterality Date  . Sciatic nerve exploration    . Tonsillectomy    . Esophagogastroduodenoscopy  02/28/2011    Procedure: ESOPHAGOGASTRODUODENOSCOPY (EGD);  Surgeon: Rogene Houston, MD;  [facility];  Service: Endoscopy;  Laterality: N/A;  1200  . Colonoscopy  05/08/2011    Procedure: COLONOSCOPY;  Surgeon: Rogene Houston, MD;  Location: [facility];  Service: Endoscopy;  Laterality: N/A;  730  . Hernia repair      as child  . Liver biopsy  2012  . Ankle surgery      right ankle talor repair  . Esophagogastroduodenoscopy N/A 06/11/2012    Procedure: ESOPHAGOGASTRODUODENOSCOPY (EGD);  Surgeon: Rogene Houston, MD;  Location: [facility];  Service: Endoscopy;  Laterality: N/A;  1200  FYI patient is 400 pounds    History   Social History  . Marital Status: Divorced    Spouse Name: N/A    Number of Children: N/A  . Years of Education: N/A   Occupational History  . Not on file.   Social History Main Topics  . Smoking status: Never Smoker   . Smokeless tobacco: Current User    Types: Snuff, Chew     Comment: Pt reports that he "dips"  . Alcohol Use: 0.6 oz/week    1 Cans of beer per week  . Drug Use: No     Comment: Use to smoke cocaine and marijuana. No IV drug use  . Sexual Activity: Yes   Other Topics Concern  . Not on file   Social History Narrative  . No narrative on file   Family History  Problem Relation Age of Onset  . Colon cancer Mother   . Cancer Mother     intestine  . Cancer Father     throat  . Cancer Brother     brain  . Cancer Sister    Current Outpatient Prescriptions on File Prior to Visit  Medication Sig Dispense Refill  . albuterol (PROVENTIL HFA;VENTOLIN HFA) 108 (90 BASE)  MCG/ACT inhaler Inhale 2 puffs into the lungs every 6 (six) hours as needed. For shortness of breath      . allopurinol (ZYLOPRIM) 300 MG tablet Take 2 tablets (600 mg total) by mouth daily.  60 tablet  6  . ALPRAZolam (XANAX) 0.5 MG tablet Take 1 tablet (0.5 mg total) by mouth at bedtime as needed. Pt states he takes usually 2 sometimes 3 per night  60 tablet  0  . aspirin EC 81 MG tablet Take 81 mg by mouth once as needed.       . diltiazem (CARDIZEM) 90 MG tablet Take 1 tablet (90 mg total) by mouth 3 (three) times daily.  90 tablet  5  . doxepin (SINEQUAN) 10 MG capsule Take 1 capsule (10 mg total) by mouth at bedtime.  30 capsule  5  . esomeprazole (NEXIUM) 40 MG capsule Take 1 capsule (40 mg total) by mouth daily.  30 capsule  5  . ezetimibe (ZETIA) 10 MG tablet Take 1 tablet (10 mg total) by mouth daily.  30 tablet  5  . fluticasone (FLONASE) 50 MCG/ACT nasal spray Place 2 sprays into both nostrils daily as needed. For allergies  1 g  5  . folic acid (FOLVITE) 751 MCG tablet  Take 400 mcg by mouth daily.      . hydrocortisone 1 % ointment Apply 1 application topically 3 (three) times daily as needed. Patient states that this is a 2% Oint.  30 g  1  . ipratropium (ATROVENT) 0.02 % nebulizer solution Take 500 mcg by nebulization 4 (four) times daily as needed. For wheezing      . lidocaine (XYLOCAINE) 5 % ointment Apply 1 application topically 4 (four) times daily as needed. To knees and ankle as needed for pain  35.44 g  2  . Menthol-Methyl Salicylate (MUSCLE RUB) 10-15 % CREA Apply 1 application topically as needed. For muscle pain      . milk thistle 175 MG tablet Take 175 mg by mouth 4 (four) times daily.       . Multiple Vitamin (MULITIVITAMIN WITH MINERALS) TABS Take 1 tablet by mouth daily.      Marland Kitchen neomycin-bacitracin-polymyxin (NEOSPORIN) ointment Apply 1 application topically every 12 (twelve) hours as needed. For cut on ankle      . nitroGLYCERIN (NITROSTAT) 0.4 MG SL tablet Place 1 tablet (0.4 mg total) under the tongue every 5 (five) minutes as needed for chest pain (may repeat x3).  10 tablet  2  . phenylephrine (NASAL DECONGESTANT) 1 % nasal spray Place 1 drop into the nose every 4 (four) hours as needed. For allergies      . potassium chloride (MICRO-K) 10 MEQ CR capsule Take 1 capsule (10 mEq total) by mouth daily.  30 capsule  5  . pregabalin (LYRICA) 300 MG capsule Take 1 capsule (300 mg total) by mouth 2 (two) times daily. One in am and two and night  60 capsule  5  . testosterone (ANDROGEL) 50 MG/5GM GEL Place 5 g onto the skin daily.  5 g  5  . torsemide (DEMADEX) 20 MG tablet Take 1 tablet (20 mg total) by mouth as directed. 2 tabs in the morning and 1 tab 6 hours later  90 tablet  5  . vitamin B-12 (CYANOCOBALAMIN) 100 MCG tablet Take 0.5 tablets (50 mcg total) by mouth daily.  30 tablet  11   No current facility-administered medications on file prior to visit.   Allergies  Allergen Reactions  . Oxycodone Base Itching and Nausea And Vomiting  .  Prozac [Fluoxetine Hcl] Itching   Immunization History  Administered Date(s) Administered  . Influenza Whole 10/11/2008, 01/09/2010, 11/09/2011  . Influenza,inj,Quad PF,36+ Mos 09/10/2012  . Pneumococcal Polysaccharide-23 09/08/2008  . Td 01/09/2008   Prior to Admission medications   Medication Sig Start Date End Date Taking? Authorizing Provider  albuterol (PROVENTIL HFA;VENTOLIN HFA) 108 (90 BASE) MCG/ACT inhaler Inhale 2 puffs into the lungs every 6 (six) hours as needed. For shortness of breath   Yes Historical Provider, MD  allopurinol (ZYLOPRIM) 300 MG tablet Take 2 tablets (600 mg total) by mouth daily. 01/14/13  Yes Vernie Shanks, MD  ALPRAZolam Duanne Moron) 0.5 MG tablet Take 1 tablet (0.5 mg total) by mouth at bedtime as needed. Pt states he takes usually 2 sometimes 3 per night 01/09/13  Yes Vernie Shanks, MD  aspirin EC 81 MG tablet Take 81 mg by mouth once as needed.    Yes Historical Provider, MD  colchicine (COLCRYS) 0.6 MG tablet Take 1 tablet (0.6 mg total) by mouth as needed. For gout 01/09/13  Yes Vernie Shanks, MD  diltiazem (CARDIZEM) 90 MG tablet Take 1 tablet (90 mg total) by mouth 3 (three) times daily. 01/09/13  Yes Vernie Shanks, MD  doxepin (SINEQUAN) 10 MG capsule Take 1 capsule (10 mg total) by mouth at bedtime. 01/09/13  Yes Vernie Shanks, MD  esomeprazole (NEXIUM) 40 MG capsule Take 1 capsule (40 mg total) by mouth daily. 01/09/13  Yes Vernie Shanks, MD  ezetimibe (ZETIA) 10 MG tablet Take 1 tablet (10 mg total) by mouth daily. 01/09/13  Yes Vernie Shanks, MD  fluticasone (FLONASE) 50 MCG/ACT nasal spray Place 2 sprays into both nostrils daily as needed. For allergies 01/09/13  Yes Vernie Shanks, MD  folic acid (FOLVITE) 762 MCG tablet Take 400 mcg by mouth daily.   Yes Historical Provider, MD  HYDROcodone-ibuprofen (VICOPROFEN) 7.5-200 MG per tablet Take 1 tablet by mouth every 8 (eight) hours as needed. 01/09/13  Yes Vernie Shanks, MD  hydrocortisone 1 % ointment Apply 1  application topically 3 (three) times daily as needed. Patient states that this is a 2% Oint. 01/09/13  Yes Vernie Shanks, MD  ipratropium (ATROVENT) 0.02 % nebulizer solution Take 500 mcg by nebulization 4 (four) times daily as needed. For wheezing   Yes Historical Provider, MD  lidocaine (XYLOCAINE) 5 % ointment Apply 1 application topically 4 (four) times daily as needed. To knees and ankle as needed for pain 01/09/13  Yes Vernie Shanks, MD  Menthol-Methyl Salicylate (MUSCLE RUB) 10-15 % CREA Apply 1 application topically as needed. For muscle pain   Yes Historical Provider, MD  milk thistle 175 MG tablet Take 175 mg by mouth 4 (four) times daily.    Yes Historical Provider, MD  Multiple Vitamin (MULITIVITAMIN WITH MINERALS) TABS Take 1 tablet by mouth daily.   Yes Historical Provider, MD  neomycin-bacitracin-polymyxin (NEOSPORIN) ointment Apply 1 application topically every 12 (twelve) hours as needed. For cut on ankle   Yes Historical Provider, MD  nitroGLYCERIN (NITROSTAT) 0.4 MG SL tablet Place 1 tablet (0.4 mg total) under the tongue every 5 (five) minutes as needed for chest pain (may repeat x3). 01/09/13  Yes Vernie Shanks, MD  phenylephrine (NASAL DECONGESTANT) 1 % nasal spray Place 1 drop into the nose every 4 (four) hours as needed. For allergies   Yes Historical Provider, MD  potassium chloride (MICRO-K) 10 MEQ CR capsule Take 1 capsule (10 mEq total) by mouth daily. 01/09/13  Yes Vernie Shanks, MD  pregabalin (LYRICA) 300 MG capsule Take 1 capsule (300 mg total) by mouth 2 (two) times daily. One in am and two and night 01/09/13  Yes Vernie Shanks, MD  testosterone (ANDROGEL) 50 MG/5GM GEL Place 5 g onto the skin daily. 01/09/13  Yes Vernie Shanks, MD  torsemide (DEMADEX) 20 MG tablet Take 1 tablet (20 mg total) by mouth as directed. 2 tabs in the morning and 1 tab 6 hours later 01/09/13  Yes Vernie Shanks, MD  vitamin B-12 (CYANOCOBALAMIN) 100 MCG tablet Take 0.5 tablets (50 mcg total) by mouth  daily. 01/09/13  Yes Vernie Shanks, MD     ROS: As above in the HPI. All other systems are stable or negative.  OBJECTIVE: APPEARANCE:  Patient in no acute distress.The patient appeared well nourished and normally developed. Acyanotic. Waist: VITAL SIGNS:BP 149/96  Pulse 100  Temp(Src) 97.6 F (36.4 C) (Oral)  Ht 6' (1.829 m)  Wt 430 lb 12.8 oz (195.41 kg)  BMI 58.41 kg/m2  Morbidly Obese WM with poor habitus and strong body odor.  SKIN: warm and  Dry without overt rashes, tattoos and scars  HEAD and Neck: without JVD, Head and scalp: normal Eyes:No scleral icterus. Fundi normal, eye movements normal. Ears: Auricle normal, canal normal, Tympanic membranes normal, insufflation normal. Nose: normal Throat: normal Neck: reduced ROM.to 45 degrees.with pain  thyroid: normal  CHEST & LUNGS: Chest wall: normal Lungs: Clear  CVS: Reveals the PMI to be normally located. Regular rhythm, First and Second Heart sounds are normal,  absence of murmurs, rubs or gallops. Peripheral vasculature: Radial pulses: normal Dorsal pedis pulses: normal Posterior pulses: normal  ABDOMEN:  Appearance: morbidly obese Benign, no organomegaly, no masses, no Abdominal Aortic enlargement. No Guarding , no rebound. No Bruits. Bowel sounds: normal  RECTAL: N/A GU: N/A  EXTREMETIES: nonedematous.  MUSCULOSKELETAL:  Spine: abnormal. Reduced ROm of neck and thoraco-lumbar spine Joints: crepitus of knees.  NEUROLOGIC: oriented to time,place and person; nonfocal.  Results for orders placed in visit on 01/09/13  CMP14+EGFR      Result Value Ref Range   Glucose 102 (*) 65 - 99 mg/dL   BUN 9  6 - 24 mg/dL   Creatinine, Ser 0.84  0.76 - 1.27 mg/dL   GFR calc non Af Amer 103  >59 mL/min/1.73   GFR calc Af Amer 119  >59 mL/min/1.73   BUN/Creatinine Ratio 11  9 - 20   Sodium 140  134 - 144 mmol/L   Potassium 4.3  3.5 - 5.2 mmol/L   Chloride 100  97 - 108 mmol/L   CO2 23  18 - 29 mmol/L    Calcium 9.3  8.7 - 10.2 mg/dL   Total Protein 7.3  6.0 - 8.5 g/dL   Albumin 4.4  3.5 - 5.5 g/dL   Globulin, Total 2.9  1.5 - 4.5 g/dL   Albumin/Globulin Ratio 1.5  1.1 - 2.5   Total Bilirubin 0.9  0.0 - 1.2 mg/dL   Alkaline Phosphatase 70  39 - 117 IU/L   AST 49 (*) 0 - 40 IU/L   ALT 41  0 - 44 IU/L  NMR, LIPOPROFILE      Result Value Ref Range   LDL Particle Number 1205 (*) <1000 nmol/L   LDLC SERPL CALC-MCNC 78  <100 mg/dL   HDL Cholesterol by  NMR 38 (*) >=40 mg/dL   Triglycerides by NMR 175 (*) <150 mg/dL   Cholesterol 151  <200 mg/dL   HDL Particle Number 26.9 (*) >=30.5 umol/L   Small LDL Particle Number 161  <=527 nmol/L   LDL Size 21.1  >20.5 nm   LP-IR Score 74 (*) <=45  URIC ACID      Result Value Ref Range   Uric Acid 8.1  3.7 - 8.6 mg/dL  VITAMIN B12      Result Value Ref Range   Vitamin B-12 647  211 - 946 pg/mL  TESTOSTERONE,FREE AND TOTAL      Result Value Ref Range   Testosterone 523  348 - 1197 ng/dL   COMMENT Comment     Testosterone, Free 4.7 (*) 6.8 - 21.5 pg/mL  PSA, TOTAL AND FREE      Result Value Ref Range   PSA 0.3  0.0 - 4.0 ng/mL   PSA, Free 0.14  N/A ng/mL   PSA, Free Pct 46.7    POCT CBC      Result Value Ref Range   WBC 3.0 (*) 4.6 - 10.2 K/uL   Lymph, poc 1.1  0.6 - 3.4   POC LYMPH PERCENT 37.1  10 - 50 %L   POC Granulocyte 1.7 (*) 2 - 6.9   Granulocyte percent 55.7  37 - 80 %G   RBC 5.7  4.69 - 6.13 M/uL   Hemoglobin 15.1  14.1 - 18.1 g/dL   HCT, POC 48.7  43.5 - 53.7 %   MCV 85.3  80 - 97 fL   MCH, POC 26.4 (*) 27 - 31.2 pg   MCHC 31.0 (*) 31.8 - 35.4 g/dL   RDW, POC 15.0     Platelet Count, POC 40.0 (*) 142 - 424 K/uL   MPV 8.8  0 - 99.8 fL  POCT GLYCOSYLATED HEMOGLOBIN (HGB A1C)      Result Value Ref Range   Hemoglobin A1C 5.8      ASSESSMENT: Neck pain - Plan: DG Cervical Spine Complete, HYDROcodone-ibuprofen (VICOPROFEN) 7.5-200 MG per tablet  Back pain - Plan: HYDROcodone-ibuprofen (VICOPROFEN) 7.5-200 MG per  tablet  SLEEP APNEA, OBSTRUCTIVE  OBESITY, MORBID  HYPERTENSION, PULMONARY  DIASTOLIC HEART FAILURE, CHRONIC  Cirrhosis  CAD  Atrial fibrillation  DIABETES MELLITUS  GOUT  Genu varus  THROMBOCYTOPENIA  Drug-drug interaction - risk discussed. no prblems with colcrys but advised him not to take and to be compliant with diet and allopurinal.  PLAN: Discussed with patient that the colcrys and diltiazem has drug interaction risks. And that he needs to make the dietary changes to control his gout and hyperuricemia.  Handout on purine  Restricted  Diet.  Stop taking and  Do not take colcrys in the future.    Orders Placed This Encounter  Procedures  . DG Cervical Spine Complete    Standing Status: Future     Number of Occurrences: 1     Standing Expiration Date: 04/20/2014    Order Specific Question:  Reason for Exam (SYMPTOM  OR DIAGNOSIS REQUIRED)    Answer:  neck pain    Order Specific Question:  Preferred imaging location?    Answer:  Internal  [facility] reading (PRIMARY) by  Dr. Chronic  Changes from old injury C3-C5 with significant degenerative changes.                             Meds ordered this  encounter  Medications  . HYDROcodone-ibuprofen (VICOPROFEN) 7.5-200 MG per tablet    Sig: Take 1 tablet by mouth every 8 (eight) hours as needed.    Dispense:  90 tablet    Refill:  0    Order Specific Question:  Supervising Provider    Answer:  Chipper Herb [[id number]]   Medications Discontinued During This Encounter  Medication Reason  . colchicine (COLCRYS) 0.6 MG tablet Discontinued by provider  . HYDROcodone-ibuprofen (VICOPROFEN) 7.5-200 MG per tablet Reorder   Return for as planned in 3 months., Recheck medical problems.  Shjon Lizarraga P. Jacelyn Grip, M.D.

## 2013-02-20 ENCOUNTER — Ambulatory Visit: Payer: Medicare Other | Admitting: Pulmonary Disease

## 2013-02-26 LAB — HM DIABETES EYE EXAM

## 2013-03-09 ENCOUNTER — Telehealth: Payer: Self-pay | Admitting: Cardiology

## 2013-03-09 NOTE — Telephone Encounter (Signed)
I would like to see him at the 3/18 appt prior to clearance.

## 2013-03-09 NOTE — Telephone Encounter (Signed)
Per pt telephone call - he was not returning a call he is needing to know if he has surgical clearance for a hernia repair surgery to be done by Dr Sherrlyn Hock at [facility] in [place], [place].  Pt is aware Dr Percival Spanish not in the office today but I will forward this to him for review and clearance.  Pt also aware he probably needs to be seen before clearance can be given.  He does have an appointment 3/18 with Dr Percival Spanish.

## 2013-03-09 NOTE — Telephone Encounter (Signed)
New message ° ° ° ° ° °Returning a nurses call °

## 2013-03-10 ENCOUNTER — Other Ambulatory Visit: Payer: Self-pay | Admitting: Family Medicine

## 2013-03-10 ENCOUNTER — Ambulatory Visit: Payer: Medicare Other | Admitting: Pulmonary Disease

## 2013-03-10 DIAGNOSIS — F411 Generalized anxiety disorder: Secondary | ICD-10-CM

## 2013-03-10 DIAGNOSIS — M542 Cervicalgia: Secondary | ICD-10-CM

## 2013-03-10 DIAGNOSIS — M549 Dorsalgia, unspecified: Secondary | ICD-10-CM

## 2013-03-10 MED ORDER — HYDROCODONE-IBUPROFEN 7.5-200 MG PO TABS: 1.0000 | ORAL_TABLET | Freq: Three times a day (TID) | ORAL | Status: DC | PRN

## 2013-03-10 MED ORDER — ALPRAZOLAM 0.5 MG PO TABS: 0.5000 mg | ORAL_TABLET | Freq: Every evening | ORAL | Status: DC | PRN

## 2013-03-10 NOTE — Telephone Encounter (Signed)
Rx ready for pick up. 

## 2013-03-10 NOTE — Telephone Encounter (Signed)
Pt aware he has to be seen by Dr Percival Spanish 3/18 before clearance can be given.

## 2013-03-10 NOTE — Telephone Encounter (Signed)
Taken Care in rx

## 2013-03-10 NOTE — Telephone Encounter (Signed)
New message    Patient calling regarding cardiac clearance.  Patient inform of appt with Dr. Percival Spanish in [place].

## 2013-03-11 ENCOUNTER — Other Ambulatory Visit: Payer: Self-pay

## 2013-03-11 NOTE — Telephone Encounter (Signed)
ambien not on med list!!! Refill denied. Bring all medications at next office visit.

## 2013-03-11 NOTE — Telephone Encounter (Signed)
Last seen 02/19/13 FPW  This med not on EPIC list  If approved route to nurse to call into [facility]

## 2013-03-12 NOTE — Telephone Encounter (Signed)
Patient aware.

## 2013-03-23 ENCOUNTER — Telehealth (INDEPENDENT_AMBULATORY_CARE_PROVIDER_SITE_OTHER): Payer: Self-pay | Admitting: *Deleted

## 2013-03-23 NOTE — Telephone Encounter (Signed)
Would like to see if he could get his lab results from Jan. They were done by his PCP. Patient was advised we do not give out results of any test that was not order by our doctors. Still would like for me to send a message. His return phone number is [phone].

## 2013-03-24 NOTE — Telephone Encounter (Signed)
I gave him results on his liver enzymes. They are better. He is not drinking. He is trying to lose weight.

## 2013-03-25 ENCOUNTER — Encounter: Payer: Self-pay | Admitting: Cardiology

## 2013-03-25 ENCOUNTER — Ambulatory Visit (INDEPENDENT_AMBULATORY_CARE_PROVIDER_SITE_OTHER): Payer: Medicare Other | Admitting: Cardiology

## 2013-03-25 ENCOUNTER — Telehealth: Payer: Self-pay | Admitting: Pulmonary Disease

## 2013-03-25 VITALS — BP 138/82 | HR 82 | Ht 72.0 in | Wt >= 6400 oz

## 2013-03-25 DIAGNOSIS — I4891 Unspecified atrial fibrillation: Secondary | ICD-10-CM

## 2013-03-25 DIAGNOSIS — I5032 Chronic diastolic (congestive) heart failure: Secondary | ICD-10-CM

## 2013-03-25 NOTE — Telephone Encounter (Signed)
I spoke with the pt and he states he needs a letter stating that he wears a cpap machine because he stops breathing when he sleeps and this is dangerous for his health. He is asking that this be faxed to [phone] and also mailed to his home. Ove set for 04/01/13. Please advise if ok to do letter. Thanks! Crawford Bing, CMA

## 2013-03-25 NOTE — Patient Instructions (Signed)
The current medical regimen is effective;  continue present plan and medications.  Follow up in 1 year with Dr Hochrein.  You will receive a letter in the mail 2 months before you are due.  Please call us when you receive this letter to schedule your follow up appointment.  

## 2013-03-25 NOTE — Progress Notes (Signed)
HPI The patient returns for follow up of diastolic heart failure and a history of atrial fibrillation. Since I last saw him he has had no new cardiovascular complaints. He occasionally gets palpitations treat with an extra dose up diltiazem. He is not describing any new PND or orthopnea. He's not describing any chest pressure, neck or arm discomfort.  Of note he was going to have a periumbilical hernia surgery but this was postponed because of his thrombocytopenia.  He apparently was told he could get this he has platelet transfusions first. He is asking for cardiac clearance. He has a reasonable functional level though not excellent it is certainly greater than 5 METS.    Allergies  Allergen Reactions  . Oxycodone Base Itching and Nausea And Vomiting  . Prozac [Fluoxetine Hcl] Itching    Current Outpatient Prescriptions  Medication Sig Dispense Refill  . albuterol (PROVENTIL HFA;VENTOLIN HFA) 108 (90 BASE) MCG/ACT inhaler Inhale 2 puffs into the lungs every 6 (six) hours as needed. For shortness of breath      . allopurinol (ZYLOPRIM) 300 MG tablet Take 2 tablets (600 mg total) by mouth daily.  60 tablet  6  . ALPRAZolam (XANAX) 0.5 MG tablet Take 1 tablet (0.5 mg total) by mouth at bedtime as needed. Pt states he takes usually 2 sometimes 3 per night  60 tablet  0  . aspirin EC 81 MG tablet Take 81 mg by mouth once as needed.       . diltiazem (CARDIZEM) 90 MG tablet Take 1 tablet (90 mg total) by mouth 3 (three) times daily.  90 tablet  5  . doxepin (SINEQUAN) 10 MG capsule Take 1 capsule (10 mg total) by mouth at bedtime.  30 capsule  5  . esomeprazole (NEXIUM) 40 MG capsule Take 1 capsule (40 mg total) by mouth daily.  30 capsule  5  . ezetimibe (ZETIA) 10 MG tablet Take 1 tablet (10 mg total) by mouth daily.  30 tablet  5  . fluticasone (FLONASE) 50 MCG/ACT nasal spray Place 2 sprays into both nostrils daily as needed. For allergies  1 g  5  . folic acid (FOLVITE) 315 MCG tablet Take  400 mcg by mouth daily.      Marland Kitchen HYDROcodone-ibuprofen (VICOPROFEN) 7.5-200 MG per tablet Take 1 tablet by mouth every 8 (eight) hours as needed.  90 tablet  0  . hydrocortisone 1 % ointment Apply 1 application topically 3 (three) times daily as needed. Patient states that this is a 2% Oint.  30 g  1  . ipratropium (ATROVENT) 0.02 % nebulizer solution Take 500 mcg by nebulization 4 (four) times daily as needed. For wheezing      . lidocaine (XYLOCAINE) 5 % ointment Apply 1 application topically 4 (four) times daily as needed. To knees and ankle as needed for pain  35.44 g  2  . Menthol-Methyl Salicylate (MUSCLE RUB) 10-15 % CREA Apply 1 application topically as needed. For muscle pain      . milk thistle 175 MG tablet Take 175 mg by mouth 4 (four) times daily.       . Multiple Vitamin (MULITIVITAMIN WITH MINERALS) TABS Take 1 tablet by mouth daily.      Marland Kitchen neomycin-bacitracin-polymyxin (NEOSPORIN) ointment Apply 1 application topically every 12 (twelve) hours as needed. For cut on ankle      . nitroGLYCERIN (NITROSTAT) 0.4 MG SL tablet Place 1 tablet (0.4 mg total) under the tongue every 5 (five) minutes as  needed for chest pain (may repeat x3).  10 tablet  2  . phenylephrine (NASAL DECONGESTANT) 1 % nasal spray Place 1 drop into the nose every 4 (four) hours as needed. For allergies      . potassium chloride (MICRO-K) 10 MEQ CR capsule Take 1 capsule (10 mEq total) by mouth daily.  30 capsule  5  . pregabalin (LYRICA) 300 MG capsule Take 1 capsule (300 mg total) by mouth 2 (two) times daily. One in am and two and night  60 capsule  5  . testosterone (ANDROGEL) 50 MG/5GM GEL Place 5 g onto the skin daily.  5 g  5  . torsemide (DEMADEX) 20 MG tablet Take 1 tablet (20 mg total) by mouth as directed. 2 tabs in the morning and 1 tab 6 hours later  90 tablet  5  . vitamin B-12 (CYANOCOBALAMIN) 100 MCG tablet Take 0.5 tablets (50 mcg total) by mouth daily.  30 tablet  11   No current facility-administered  medications for this visit.    Past Medical History  Diagnosis Date  . Gout   . Diabetes mellitus     type 2  . RA (rheumatoid arthritis)   . COPD (chronic obstructive pulmonary disease)     with cpap  . Atrial fibrillation   . Cirrhosis   . Anxiety   . Heart murmur   . CHF (congestive heart failure)   . Constipation   . Sleep apnea     cpap used- level 10 and greater  . Neuromuscular disorder     neuropathy in hands and feet  . Cirrhosis     NASH  . Thrombocytopenia   . Genu varus     Past Surgical History  Procedure Laterality Date  . Sciatic nerve exploration    . Tonsillectomy    . Esophagogastroduodenoscopy  02/28/2011    Procedure: ESOPHAGOGASTRODUODENOSCOPY (EGD);  Surgeon: Rogene Houston, MD;  [facility];  Service: Endoscopy;  Laterality: N/A;  1200  . Colonoscopy  05/08/2011    Procedure: COLONOSCOPY;  Surgeon: Rogene Houston, MD;  [facility]: [facility];  Service: Endoscopy;  Laterality: N/A;  730  . Hernia repair      as child  . Liver biopsy  2012  . Ankle surgery      right ankle talor repair  . Esophagogastroduodenoscopy N/A 06/11/2012    Procedure: ESOPHAGOGASTRODUODENOSCOPY (EGD);  Surgeon: Rogene Houston, MD;  Location: [facility];  Service: Endoscopy;  Laterality: N/A;  1200  FYI patient is 400 pounds    ROS:  Low grade temperature. Otherwise as stated in the HPI and negative for all other systems.  PHYSICAL EXAM Ht 6' (1.829 m)  Wt 417 lb (189.15 kg)  BMI 56.54 kg/m2 GENERAL:  Well appearing NECK:  No jugular venous distention, waveform within normal limits, carotid upstroke brisk and symmetric, no bruits, no thyromegaly LUNGS:  Clear to auscultation bilaterally BACK:  No CVA tenderness CHEST:  Unremarkable HEART:  PMI not displaced or sustained,S1 and S2 within normal limits, no S3, no S4, no clicks, no rubs, no murmurs ABD:  Flat, positive bowel sounds normal in frequency in pitch, no bruits, no rebound, no guarding, no  midline pulsatile mass, no hepatomegaly, no splenomegaly, morbidly obese EXT:  2 plus pulses throughout, trace edema, no cyanosis no clubbing SKIN:  Psoriasis    EKG:  Sinus rhythm, rate 78, axis within normal limits, intervals within normal limits, no acute ST-T wave changes.  03/25/2013   ASSESSMENT AND PLAN  PREOP- The patient is still wanting to have umbilical hernia surgery. He has no high-risk findings or symptoms other than his morbid obesity. There've been no cardiac contraindication to this and no further testing would be needed according to ACC/AHA guidelines. Certainly with a history of atrial fibrillation this could occur at any time of the stress of surgery and would need to be dealt with acutely.  ATRIAL FIBRILLATION - The patient has not had any documented recurrence of this although he has some palpitations occasionally. Given his liver disease and thrombocytopenia and relatively low thromboembolic risk I am avoiding systemic anticoagulation.  OBESITY, MORBID -  We have discussed this at multiple appointments  [place] - He seems to be euvolemic. His EF was 65% on his echo last year. No further evaluation or change in therapy is indicated.

## 2013-03-25 NOTE — Telephone Encounter (Signed)
LMTCB-need to know what letter needs to say and patient is due for ROV with RA 03-2013(needs to be made)

## 2013-03-26 ENCOUNTER — Encounter: Payer: Self-pay | Admitting: *Deleted

## 2013-03-26 NOTE — Telephone Encounter (Signed)
OK to give letter

## 2013-03-26 NOTE — Telephone Encounter (Signed)
Spoke w/ pt. Aware letter has been done and will fax this to him. Also will mail copy of letter as well. Nothing further needed

## 2013-04-01 ENCOUNTER — Ambulatory Visit: Payer: Medicare Other | Admitting: Pulmonary Disease

## 2013-04-06 ENCOUNTER — Other Ambulatory Visit: Payer: Self-pay | Admitting: Family Medicine

## 2013-04-07 ENCOUNTER — Telehealth: Payer: Self-pay | Admitting: Family Medicine

## 2013-04-07 NOTE — Telephone Encounter (Signed)
Last ov 2/15. Last refill on Xanax 03/12/13. Review pt directions. It says one qhs but then it says he may take more. Last refill hydrocod was 03/14/13. Last refill Zolpidem 7/14. This med is not on pt med list. Please route to pool to call in if approved and print Hydrocod for pt to pickup. See allergies.Thanks.

## 2013-04-07 NOTE — Telephone Encounter (Signed)
Pt aware to pick up rx at front desk

## 2013-04-07 NOTE — Telephone Encounter (Signed)
Patient aware.

## 2013-04-07 NOTE — Telephone Encounter (Signed)
Rx ready for pick up. Zolpidem denied. This was discontinued from last year.

## 2013-04-17 ENCOUNTER — Telehealth: Payer: Self-pay | Admitting: Cardiology

## 2013-04-17 NOTE — Telephone Encounter (Signed)
There is no reason from a cardiac standpoint that he could not apply for this job.  [name]

## 2013-04-17 NOTE — Telephone Encounter (Signed)
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  He feels comfortable that he can do it.   It may be an opportunity to get off disability.  The company requires a letter stating he is ok to work, since he currently receives disability. He has not had hernia surgery yet.   Routing to Dr. Percival Spanish.  If ok with Dr. Percival Spanish,  fax to patients home # [phone].

## 2013-04-17 NOTE — Telephone Encounter (Signed)
New message     Need a note from Dr Percival Spanish saying it is ok for him to work.  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  Pt want nurse to call and he will give her the fax number and further instructions.  Pt is aware that Pam is out of the office today.

## 2013-04-20 ENCOUNTER — Encounter: Payer: Self-pay | Admitting: *Deleted

## 2013-04-20 ENCOUNTER — Telehealth: Payer: Self-pay | Admitting: Cardiology

## 2013-04-20 NOTE — Telephone Encounter (Signed)
Pt aware letter will be faxed.  He will call back if he doesn't receive it.

## 2013-04-20 NOTE — Telephone Encounter (Signed)
Letter completed and will be taken to MR to be faxed as requested.

## 2013-04-20 NOTE — Telephone Encounter (Signed)
New message   Patient calling stating he has not receive his fax from [facility].    Patient  Giving  his fax number again to nurse Pam  Fax [phone].

## 2013-04-20 NOTE — Telephone Encounter (Signed)
Paperwork was given to MR to fax.  It will be faxed to his number that I reviewed with him earlier as soon as they possibly can fax it.

## 2013-04-21 ENCOUNTER — Telehealth: Payer: Self-pay | Admitting: Family Medicine

## 2013-04-21 NOTE — Telephone Encounter (Signed)
appt made for scrotum pain and swelling.

## 2013-04-22 ENCOUNTER — Encounter: Payer: Self-pay | Admitting: Family Medicine

## 2013-04-22 ENCOUNTER — Ambulatory Visit (HOSPITAL_COMMUNITY): Payer: Medicare Other

## 2013-04-22 ENCOUNTER — Ambulatory Visit (INDEPENDENT_AMBULATORY_CARE_PROVIDER_SITE_OTHER): Payer: Medicare Other | Admitting: Family Medicine

## 2013-04-22 VITALS — BP 185/107 | HR 89 | Temp 98.1°F | Ht 72.0 in | Wt >= 6400 oz

## 2013-04-22 DIAGNOSIS — K760 Fatty (change of) liver, not elsewhere classified: Secondary | ICD-10-CM

## 2013-04-22 DIAGNOSIS — E119 Type 2 diabetes mellitus without complications: Secondary | ICD-10-CM

## 2013-04-22 DIAGNOSIS — R609 Edema, unspecified: Secondary | ICD-10-CM

## 2013-04-22 DIAGNOSIS — N5089 Other specified disorders of the male genital organs: Secondary | ICD-10-CM

## 2013-04-22 DIAGNOSIS — K7689 Other specified diseases of liver: Secondary | ICD-10-CM

## 2013-04-22 LAB — POCT CBC
Granulocyte percent: 66.5 %G (ref 37–80)
HCT, POC: 45.2 % (ref 43.5–53.7)
Hemoglobin: 14.7 g/dL (ref 14.1–18.1)
Lymph, poc: 0.9 (ref 0.6–3.4)
MCH, POC: 28.5 pg (ref 27–31.2)
MCHC: 32.6 g/dL (ref 31.8–35.4)
MCV: 87.6 fL (ref 80–97)
MPV: 8.7 fL (ref 0–99.8)
POC Granulocyte: 2.1 (ref 2–6.9)
POC LYMPH PERCENT: 29.1 %L (ref 10–50)
Platelet Count, POC: 32 10*3/uL — AB (ref 142–424)
RBC: 5.2 M/uL (ref 4.69–6.13)
RDW, POC: 15.7 %
WBC: 3.2 10*3/uL — AB (ref 4.6–10.2)

## 2013-04-22 LAB — GLUCOSE, POCT (MANUAL RESULT ENTRY): POC Glucose: 153 mg/dl — AB (ref 70–99)

## 2013-04-22 LAB — POCT GLYCOSYLATED HEMOGLOBIN (HGB A1C): Hemoglobin A1C: 7.2

## 2013-04-22 MED ORDER — METOLAZONE 2.5 MG PO TABS: ORAL_TABLET | ORAL | Status: DC

## 2013-04-22 NOTE — Progress Notes (Signed)
   Subjective:    Patient ID: [name], male    DOB: Nov 08, 1965, 48 y.o.   MRN: [id number]  HPI This 48 y.o. male presents for evaluation of elevated glucose.  He quit taking metformin and then noticed he had vision problem and then started to take metformin again.  He took his blood sugar  And it was 200 the other day.   He has been having problems with swelling.  He has stage 4 cirrhosis and sees a liver doctor.  He was 422 pounds and now he is 432 pounds. He has hx of CHF. He has been having problems with left testicular swelling.   Review of Systems No chest pain, SOB, HA, dizziness, vision change, N/V, diarrhea, constipation, dysuria, urinary urgency or frequency, myalgias, arthralgias or rash.     Objective:   Physical Exam Vital signs noted  Chronically ill obese male  HEENT - Head atraumatic Normocephalic                Eyes - PERRLA, Conjuctiva - clear Sclera- Clear EOMI                Ears - EAC's Wnl TM's Wnl Gross Hearing WNL                Throat - oropharanx wnl Respiratory - Lungs CTA bilateral Cardiac - RRR S1 and S2 without murmur GI - Abdomen distended, umbilical hernia, soft Nontender and bowel sounds active x 4 Extremities - Anasarca edema in LE's GU - Left testicle is swollen and large and no inguinal hernia appreciated.       Assessment & Plan:  Diabetes - Plan: POCT CBC, CMP14+EGFR, POCT glycosylated hemoglobin (Hb A1C), POCT glucose (manual entry), TSH  Testicular swelling - Plan: US Scrotum  Edema - Plan: US Scrotum, metolazone (ZAROXOLYN) 2.5 MG tablet  NAFLD - Continue current regimen and may need to see Hepatologist.  Follow up in one week  Follow up in one week and prn  Lysbeth Penner FNP

## 2013-04-23 LAB — CMP14+EGFR
ALT: 61 IU/L — ABNORMAL HIGH (ref 0–44)
AST: 93 IU/L — ABNORMAL HIGH (ref 0–40)
Albumin/Globulin Ratio: 1.5 (ref 1.1–2.5)
Albumin: 4.4 g/dL (ref 3.5–5.5)
Alkaline Phosphatase: 77 IU/L (ref 39–117)
BUN/Creatinine Ratio: 11 (ref 9–20)
BUN: 8 mg/dL (ref 6–24)
CO2: 22 mmol/L (ref 18–29)
Calcium: 9.2 mg/dL (ref 8.7–10.2)
Chloride: 98 mmol/L (ref 97–108)
Creatinine, Ser: 0.74 mg/dL — ABNORMAL LOW (ref 0.76–1.27)
GFR calc Af Amer: 125 mL/min/{1.73_m2} (ref >59)
GFR calc non Af Amer: 108 mL/min/{1.73_m2} (ref >59)
Globulin, Total: 2.9 g/dL (ref 1.5–4.5)
Glucose: 160 mg/dL — ABNORMAL HIGH (ref 65–99)
Potassium: 3.7 mmol/L (ref 3.5–5.2)
Sodium: 140 mmol/L (ref 134–144)
Total Bilirubin: 1.2 mg/dL (ref 0.0–1.2)
Total Protein: 7.3 g/dL (ref 6.0–8.5)

## 2013-04-23 LAB — TSH: TSH: 2.02 u[IU]/mL (ref 0.450–4.500)

## 2013-04-27 ENCOUNTER — Encounter: Payer: Self-pay | Admitting: Family Medicine

## 2013-04-27 ENCOUNTER — Ambulatory Visit (INDEPENDENT_AMBULATORY_CARE_PROVIDER_SITE_OTHER): Payer: Medicare Other | Admitting: Family Medicine

## 2013-04-27 VITALS — BP 176/107 | HR 81 | Temp 98.4°F | Ht 72.0 in | Wt >= 6400 oz

## 2013-04-27 DIAGNOSIS — R609 Edema, unspecified: Secondary | ICD-10-CM

## 2013-04-27 DIAGNOSIS — R269 Unspecified abnormalities of gait and mobility: Secondary | ICD-10-CM

## 2013-04-27 DIAGNOSIS — E7220 Disorder of urea cycle metabolism, unspecified: Secondary | ICD-10-CM

## 2013-04-27 NOTE — Progress Notes (Signed)
   Subjective:    Patient ID: [name], male    DOB: 08-Dec-1965, 48 y.o.   MRN: [id number]  HPI  This 48 y.o. male presents for evaluation of swelling and left testicular edema.  He has had Recent labs drawn at [facility] ED and he has brought those with him.  His testicular US is back and he has large left hydrocele and otherwise normal.  He was rx'd zaroxlyn and he has diuresed 30 pounds recently and feels a lot better.  His labs from [facility] show improvement in LFT's and platelets but he does have elevated amonia of 53 but is asymptomatic.  He states he has been drinking and has quit.  He c/o falling a lot.  He is not using walker and PT has recommended this. He has hx of talar dislocation in his left ankle.  He states he was charged with dui and was actually Taking listorine and wants a letter to the court with my opinion about drinking listorine all day affecting your ability to drive.  He states he wants letter about his ambulation and gait problems.  He has genu varus, OA, back pain and cervical spine pain.    Review of Systems    No chest pain, SOB, HA, dizziness, vision change, N/V, diarrhea, constipation, dysuria, urinary urgency or frequency, myalgias, arthralgias or rash.  Objective:   Physical Exam Vital signs noted  Chronically ill appearing male in NAD  HEENT - Head atraumatic Normocephalic                Eyes - PERRLA, Conjuctiva - clear Sclera- Clear EOMI                Ears - EAC's Wnl TM's Wnl Gross Hearing WNL                Throat - oropharanx wnl Respiratory - Lungs CTA bilateral Cardiac - RRR S1 and S2 without murmur GI - Abdomen soft Nontender and bowel sounds active x 4 Extremities - Bilateral anasarca edema.  Genu varus left foot Neuro - Decreased sensation in lower extremities Gait - Shuffles and has difficulty with ambulating due to left foot deformity and pain in            His lower extremities when walking.  Gait is unsteady.       Assessment &  Plan:  Abnormality of gait - Walker  Edema - Hold zaroxyln for now  Serum ammonia increased - Follow up with GI doctor ASAP and referral put in.  Discussed not drinking anymore ETOH and he agrees.  Discussed he needs not to drink any Mouth wash.    Lysbeth Penner FNP

## 2013-05-04 ENCOUNTER — Encounter (INDEPENDENT_AMBULATORY_CARE_PROVIDER_SITE_OTHER): Payer: Self-pay | Admitting: Internal Medicine

## 2013-05-04 ENCOUNTER — Encounter (INDEPENDENT_AMBULATORY_CARE_PROVIDER_SITE_OTHER): Payer: Self-pay

## 2013-05-04 ENCOUNTER — Ambulatory Visit (INDEPENDENT_AMBULATORY_CARE_PROVIDER_SITE_OTHER): Payer: Medicare Other | Admitting: Internal Medicine

## 2013-05-04 VITALS — BP 120/82 | HR 84 | Temp 98.2°F | Ht 72.0 in | Wt >= 6400 oz

## 2013-05-04 DIAGNOSIS — K7030 Alcoholic cirrhosis of liver without ascites: Secondary | ICD-10-CM

## 2013-05-04 MED ORDER — LACTULOSE 10 GM/15ML PO SOLN: 10.0000 g | Freq: Two times a day (BID) | ORAL | Status: DC

## 2013-05-04 NOTE — Patient Instructions (Signed)
OV in 3 months.  CBC. CMET and ammonia level in 2 weeks. Take Lactulose twice a day.

## 2013-05-04 NOTE — Progress Notes (Signed)
Subjective:     Patient ID: [name], male   DOB: September 14, 1965, 48 y.o.   MRN: [id number]  HPI Here today for f/u of his cirrhosis secondary to NAFLD and prior hx of alcohol use. Saw Dr. Jacelyn Grip on 04/22/2013 due to swelling in his testicles.  Noted his platelet ct had dropped to 30.  Ammonia level 52 on 04/25/13.  Evaluated in the ED at [facility] around the 04/25/2013 , platelet ct 56, ammonia 52,  PT/INR  14/1 and 1.1, BNP 9. Total protein 8.8, Albumin 4.8, ALP 73, AST 83, ALT 61, Total bili 1.6, Direct 0.4, Indirect 1.2, NA 137, K 3.8, Troponin less than 0.01., BUN 15, Creatinine 0.74 Patient viral markers for Hepatitis B and C have been negative. 12/22/2012 US Liver: Severe hepatomegaly and splenomegaly. Echogenic liver disease consistent with chronic hepatic disease/fatty infiltration.  He is walking but very little. ( He has bilateral knee pain) Appetite is good.  Last weight 04/22/2013 432. Today his weight is 421.5lb. Received Zaroxolyn  2.65m  x 3 days due to edema in lower extremities, scrotum and abdomen.  Usually has a BM 1-2 a day. No melena or bright red rectal bleeding.        Patient underwent EGD on 02/28/2011 revealing 3 columns of grade 1 esophageal varices and changes of portal gastropathy.  He underwent colonoscopy on 05/08/2011 because of rectal bleeding. All together 4 polyps were removed 2 via cold biopsy and two were snared. 3 of these polyps were tubular adenomas. He also had external hemorrhoids and mild sigmoid diverticulosis.  Patient underwent another EGD on 06/11/2012 because of history of melena and noted to have 2 short columns of grade 1 esophageal varices and portal gastropathy.  Patient was advised to keep Vicoprofen used a minimum.  Last ultrasound was in November 2012 revealing hepatomegaly and splenomegaly.  AFP was 5.1 on 06/11/2012  Review of Systems Past Medical History  Diagnosis Date  . Gout   . Diabetes mellitus     type 2  . RA (rheumatoid arthritis)    . COPD (chronic obstructive pulmonary disease)     with cpap  . Atrial fibrillation   . Cirrhosis   . Anxiety   . Heart murmur   . CHF (congestive heart failure)   . Constipation   . Sleep apnea     cpap used- level 10 and greater  . Neuromuscular disorder     neuropathy in hands and feet  . Cirrhosis     NASH  . Thrombocytopenia   . Genu varus     Past Surgical History  Procedure Laterality Date  . Sciatic nerve exploration    . Tonsillectomy    . Esophagogastroduodenoscopy  02/28/2011    Procedure: ESOPHAGOGASTRODUODENOSCOPY (EGD);  Surgeon: NRogene Houston MD;  [facility];  Service: Endoscopy;  Laterality: N/A;  1200  . Colonoscopy  05/08/2011    Procedure: COLONOSCOPY;  Surgeon: NRogene Houston MD;  [facility];  Service: Endoscopy;  Laterality: N/A;  730  . Hernia repair      as child  . Liver biopsy  2012  . Ankle surgery      right ankle talor repair  . Esophagogastroduodenoscopy N/A 06/11/2012    Procedure: ESOPHAGOGASTRODUODENOSCOPY (EGD);  Surgeon: NRogene Houston MD;  [facility];  Service: Endoscopy;  Laterality: N/A;  1200  FYI patient is 400 pounds    Allergies  Allergen Reactions  . Oxycodone Base Itching and  Nausea And Vomiting  . Prozac [Fluoxetine Hcl] Itching    Current Outpatient Prescriptions on File Prior to Visit  Medication Sig Dispense Refill  . albuterol (PROVENTIL HFA;VENTOLIN HFA) 108 (90 BASE) MCG/ACT inhaler Inhale 2 puffs into the lungs every 6 (six) hours as needed. For shortness of breath      . ALPRAZolam (XANAX) 0.5 MG tablet TAKE 1 TABLET AT BEDTIME AS NEEDED.  PT STATES HE USUALLY TAKES 2 SOMETIMES 3 PER NIGHT  30 tablet  0  . aspirin EC 81 MG tablet Take 81 mg by mouth once as needed.       . diltiazem (CARDIZEM) 90 MG tablet Take 1 tablet (90 mg total) by mouth 3 (three) times daily.  90 tablet  5  . doxepin (SINEQUAN) 10 MG capsule Take 1 capsule (10 mg total) by mouth at bedtime.  30 capsule   5  . esomeprazole (NEXIUM) 40 MG capsule Take 1 capsule (40 mg total) by mouth daily.  30 capsule  5  . ezetimibe (ZETIA) 10 MG tablet Take 1 tablet (10 mg total) by mouth daily.  30 tablet  5  . fluticasone (FLONASE) 50 MCG/ACT nasal spray Place 2 sprays into both nostrils daily as needed. For allergies  1 g  5  . folic acid (FOLVITE) 536 MCG tablet Take 400 mcg by mouth daily.      Marland Kitchen HYDROcodone-ibuprofen (VICOPROFEN) 7.5-200 MG per tablet TAKE ONE TABLET EVERY 8 HOURS AS NEEDED  90 tablet  0  . hydrocortisone 1 % ointment Apply 1 application topically 3 (three) times daily as needed. Patient states that this is a 2% Oint.  30 g  1  . ipratropium (ATROVENT) 0.02 % nebulizer solution Take 500 mcg by nebulization 4 (four) times daily as needed. For wheezing      . lidocaine (XYLOCAINE) 5 % ointment Apply 1 application topically 4 (four) times daily as needed. To knees and ankle as needed for pain  35.44 g  2  . Menthol-Methyl Salicylate (MUSCLE RUB) 10-15 % CREA Apply 1 application topically as needed. For muscle pain      . milk thistle 175 MG tablet Take 175 mg by mouth 4 (four) times daily.       . Multiple Vitamin (MULITIVITAMIN WITH MINERALS) TABS Take 1 tablet by mouth daily.      Marland Kitchen neomycin-bacitracin-polymyxin (NEOSPORIN) ointment Apply 1 application topically every 12 (twelve) hours as needed. For cut on ankle      . nitroGLYCERIN (NITROSTAT) 0.4 MG SL tablet Place 1 tablet (0.4 mg total) under the tongue every 5 (five) minutes as needed for chest pain (may repeat x3).  10 tablet  2  . phenylephrine (NASAL DECONGESTANT) 1 % nasal spray Place 1 drop into the nose every 4 (four) hours as needed. For allergies      . potassium chloride (MICRO-K) 10 MEQ CR capsule Take 1 capsule (10 mEq total) by mouth daily.  30 capsule  5  . pregabalin (LYRICA) 300 MG capsule Take 1 capsule (300 mg total) by mouth 2 (two) times daily. One in am and two and night  60 capsule  5  . testosterone (ANDROGEL) 50  MG/5GM GEL Place 5 g onto the skin daily.  5 g  5  . torsemide (DEMADEX) 20 MG tablet Take 1 tablet (20 mg total) by mouth as directed. 2 tabs in the morning and 1 tab 6 hours later  90 tablet  5  . vitamin B-12 (CYANOCOBALAMIN) 100  MCG tablet Take 0.5 tablets (50 mcg total) by mouth daily.  30 tablet  11  . metolazone (ZAROXOLYN) 2.5 MG tablet Take one po one half hour prior to torsemide for 3 days prn for severe swelling  30 tablet  0   No current facility-administered medications on file prior to visit.        Objective:   Physical Exam  Filed Vitals:   05/04/13 0921  BP: 120/82  Pulse: 84  Temp: 98.2 F (36.8 C)  Height: 6' (1.829 m)  Weight: 421 lb 8 oz (191.191 kg)  Alert, Skin warm and dry. Lungs clears. Patient is SOB on exertion. HR regular. Abdomen grossly obese.  BS+. Unable to palpate liver. Abdomen soft. Umblical hernia noted. 3-4+ edema to lower extremities. No scrotal edema noted.       Assessment:     Cirrhosis secdondary to NAFLD and prior acohol use. Patient is morbidly obese. Ammonia level 52 on 04/25/2013. Platelet up to  50 in the ED.  (04/22/2013 platelet ct 30)  Plan:    Lactulose twice a day. OV in 3 months.  Ammonia, CBC, cmet  level in 2 weeks.  Patient needs to try to do more exercising. Needs to lose weight.

## 2013-05-05 ENCOUNTER — Telehealth: Payer: Self-pay | Admitting: Family Medicine

## 2013-05-06 ENCOUNTER — Encounter (INDEPENDENT_AMBULATORY_CARE_PROVIDER_SITE_OTHER): Payer: Self-pay | Admitting: *Deleted

## 2013-05-06 ENCOUNTER — Telehealth (INDEPENDENT_AMBULATORY_CARE_PROVIDER_SITE_OTHER): Payer: Self-pay | Admitting: *Deleted

## 2013-05-06 DIAGNOSIS — K7460 Unspecified cirrhosis of liver: Secondary | ICD-10-CM

## 2013-05-06 NOTE — Telephone Encounter (Signed)
Increase the Lactulose to three times a day and let me know how you do.

## 2013-05-06 NOTE — Telephone Encounter (Signed)
.  Per Lelon Perla patient to have labs done in 2 weeks.

## 2013-05-06 NOTE — Telephone Encounter (Signed)
[name] said the new medication [name] put him on has made him constipated and gassy. After 2 days he had to got to the pharmacy and get something to make him have a BM. The return phone number is [phone].

## 2013-05-07 ENCOUNTER — Telehealth: Payer: Self-pay | Admitting: Family Medicine

## 2013-05-07 NOTE — Telephone Encounter (Signed)
Patient would like to schedule with Dietrich Pates, FNP. Appt scheduled.

## 2013-05-07 NOTE — Telephone Encounter (Signed)
Patient is being seen by Rush [facility]mark. Needs an appointment with Bill. Refill denied. Bring all medications at next office visit.

## 2013-05-07 NOTE — Telephone Encounter (Signed)
appt scheduled. Patient aware.

## 2013-05-11 ENCOUNTER — Telehealth (INDEPENDENT_AMBULATORY_CARE_PROVIDER_SITE_OTHER): Payer: Self-pay | Admitting: *Deleted

## 2013-05-11 ENCOUNTER — Ambulatory Visit (INDEPENDENT_AMBULATORY_CARE_PROVIDER_SITE_OTHER): Payer: Medicare Other | Admitting: Family Medicine

## 2013-05-11 ENCOUNTER — Encounter: Payer: Self-pay | Admitting: Family Medicine

## 2013-05-11 VITALS — BP 177/101 | HR 79 | Temp 97.3°F | Wt >= 6400 oz

## 2013-05-11 DIAGNOSIS — M549 Dorsalgia, unspecified: Secondary | ICD-10-CM

## 2013-05-11 DIAGNOSIS — F411 Generalized anxiety disorder: Secondary | ICD-10-CM

## 2013-05-11 MED ORDER — HYDROCODONE-IBUPROFEN 7.5-200 MG PO TABS: 1.0000 | ORAL_TABLET | ORAL | Status: DC | PRN

## 2013-05-11 MED ORDER — ALPRAZOLAM 0.5 MG PO TABS: 0.5000 mg | ORAL_TABLET | Freq: Two times a day (BID) | ORAL | Status: DC | PRN

## 2013-05-11 NOTE — Telephone Encounter (Signed)
He asked for you Butch Penny about his bill.

## 2013-05-11 NOTE — Progress Notes (Signed)
   Subjective:    Patient ID: [name], male    DOB: 06-06-1965, 48 y.o.   MRN: [id number]  HPI  This 48 y.o. male presents for evaluation of needing refills on his xanax and pain meds. He states he went to Urgent Care this weekend and had xrays off his back but not his tailbone and He is in severe pain.  He states he needs refill on his xanax. Records from [facility] Show he was tx'd with toradol and methylprednisone.  He has chronic back pain.  Review of Systems C/o back pain   No chest pain, SOB, HA, dizziness, vision change, N/V, diarrhea, constipation, dysuria, urinary urgency or frequency, myalgias, arthralgias or rash.  Objective:   Physical Exam Vital signs noted  Well developed well nourished male.  HEENT - Head atraumatic Normocephalic                Eyes - PERRLA, Conjuctiva - clear Sclera- Clear EOMI                Ears - EAC's Wnl TM's Wnl Gross Hearing WNL                Nose - Nares patent                 Throat - oropharanx wnl Respiratory - Lungs CTA bilateral Cardiac - RRR S1 and S2 without murmur GI - Abdomen obese soft Nontender and bowel sounds active x 4        Assessment & Plan:  Back pain - Plan: HYDROcodone-ibuprofen (VICOPROFEN) 7.5-200 MG per tablet, Ambulatory referral to Pain Clinic  Anxiety state, unspecified - Plan: ALPRAZolam Duanne Moron) 0.5 MG tablet  Lysbeth Penner FNP

## 2013-05-12 ENCOUNTER — Ambulatory Visit: Payer: Medicare Other | Admitting: Family Medicine

## 2013-05-13 NOTE — Telephone Encounter (Signed)
Call returned. Balance and mailing address provided.

## 2013-05-21 ENCOUNTER — Telehealth: Payer: Self-pay | Admitting: Family Medicine

## 2013-05-22 ENCOUNTER — Other Ambulatory Visit: Payer: Medicare Other

## 2013-05-27 ENCOUNTER — Other Ambulatory Visit (INDEPENDENT_AMBULATORY_CARE_PROVIDER_SITE_OTHER): Payer: Medicare Other

## 2013-05-27 DIAGNOSIS — K7460 Unspecified cirrhosis of liver: Secondary | ICD-10-CM

## 2013-05-27 NOTE — Progress Notes (Signed)
Pt came in for lab  only

## 2013-05-29 LAB — CBC WITH DIFFERENTIAL
BASOS ABS: 0 10*3/uL (ref 0.0–0.2)
Basos: 1 %
EOS ABS: 0.1 10*3/uL (ref 0.0–0.4)
Eos: 3 %
HCT: 41.6 % (ref 37.5–51.0)
Hemoglobin: 13.9 g/dL (ref 12.6–17.7)
Immature Grans (Abs): 0 10*3/uL (ref 0.0–0.1)
Immature Granulocytes: 0 %
Lymphocytes Absolute: 1.3 10*3/uL (ref 0.7–3.1)
Lymphs: 32 %
MCH: 29.7 pg (ref 26.6–33.0)
MCHC: 33.4 g/dL (ref 31.5–35.7)
MCV: 89 fL (ref 79–97)
Monocytes Absolute: 0.2 10*3/uL (ref 0.1–0.9)
Monocytes: 5 %
NEUTROS ABS: 2.4 10*3/uL (ref 1.4–7.0)
Neutrophils Relative %: 59 %
PLATELETS: 39 10*3/uL — AB (ref 150–379)
RBC: 4.68 x10E6/uL (ref 4.14–5.80)
RDW: 15.5 % — ABNORMAL HIGH (ref 12.3–15.4)
WBC: 4.1 10*3/uL (ref 3.4–10.8)

## 2013-05-29 LAB — CMP14+EGFR
A/G RATIO: 1.6 (ref 1.1–2.5)
ALT: 44 IU/L (ref 0–44)
AST: 49 IU/L — AB (ref 0–40)
Albumin: 4.2 g/dL (ref 3.5–5.5)
Alkaline Phosphatase: 75 IU/L (ref 39–117)
BUN/Creatinine Ratio: 17 (ref 9–20)
BUN: 16 mg/dL (ref 6–24)
CALCIUM: 9 mg/dL (ref 8.7–10.2)
CO2: 22 mmol/L (ref 18–29)
Chloride: 107 mmol/L (ref 97–108)
Creatinine, Ser: 0.93 mg/dL (ref 0.76–1.27)
GFR calc Af Amer: 111 mL/min/{1.73_m2} (ref >59)
GFR, EST NON AFRICAN AMERICAN: 96 mL/min/{1.73_m2} (ref >59)
Globulin, Total: 2.7 g/dL (ref 1.5–4.5)
Glucose: 80 mg/dL (ref 65–99)
POTASSIUM: 3.8 mmol/L (ref 3.5–5.2)
SODIUM: 145 mmol/L — AB (ref 134–144)
Total Bilirubin: 0.7 mg/dL (ref 0.0–1.2)
Total Protein: 6.9 g/dL (ref 6.0–8.5)

## 2013-06-02 ENCOUNTER — Other Ambulatory Visit: Payer: Self-pay | Admitting: Family Medicine

## 2013-06-08 NOTE — Telephone Encounter (Signed)
Last seen 05/11/13  B [place]  Last RX 05/11/13  If approved print for 06/11/13 and route to nurse

## 2013-06-09 ENCOUNTER — Other Ambulatory Visit: Payer: Self-pay | Admitting: Family Medicine

## 2013-06-09 ENCOUNTER — Telehealth: Payer: Self-pay | Admitting: Family Medicine

## 2013-06-09 DIAGNOSIS — M549 Dorsalgia, unspecified: Secondary | ICD-10-CM

## 2013-06-09 MED ORDER — HYDROCODONE-IBUPROFEN 7.5-200 MG PO TABS: 1.0000 | ORAL_TABLET | ORAL | Status: DC | PRN

## 2013-06-15 ENCOUNTER — Telehealth (INDEPENDENT_AMBULATORY_CARE_PROVIDER_SITE_OTHER): Payer: Self-pay | Admitting: *Deleted

## 2013-06-15 ENCOUNTER — Ambulatory Visit (INDEPENDENT_AMBULATORY_CARE_PROVIDER_SITE_OTHER): Payer: Medicare Other | Admitting: Internal Medicine

## 2013-06-15 NOTE — Telephone Encounter (Signed)
[name] No Showed for his apt on 06/15/13.

## 2013-06-15 NOTE — Telephone Encounter (Signed)
Noted  

## 2013-06-19 ENCOUNTER — Encounter (INDEPENDENT_AMBULATORY_CARE_PROVIDER_SITE_OTHER): Payer: Self-pay | Admitting: *Deleted

## 2013-06-22 ENCOUNTER — Other Ambulatory Visit: Payer: Self-pay | Admitting: Family Medicine

## 2013-06-25 ENCOUNTER — Telehealth: Payer: Self-pay | Admitting: Family Medicine

## 2013-06-25 NOTE — Telephone Encounter (Signed)
Patient has not been evaluated for knee pain. Discussed this with him and scheduled an appt for next week. Patient aware.

## 2013-06-29 ENCOUNTER — Encounter: Payer: Self-pay | Admitting: Family Medicine

## 2013-06-29 ENCOUNTER — Ambulatory Visit (INDEPENDENT_AMBULATORY_CARE_PROVIDER_SITE_OTHER): Payer: Medicare Other | Admitting: Family Medicine

## 2013-06-29 VITALS — BP 147/88 | HR 78 | Temp 98.0°F | Ht 72.0 in | Wt 398.8 lb

## 2013-06-29 DIAGNOSIS — M25569 Pain in unspecified knee: Secondary | ICD-10-CM

## 2013-06-29 DIAGNOSIS — M545 Low back pain, unspecified: Secondary | ICD-10-CM

## 2013-06-29 DIAGNOSIS — M25562 Pain in left knee: Secondary | ICD-10-CM

## 2013-06-29 MED ORDER — HYDROCODONE-IBUPROFEN 7.5-200 MG PO TABS: 1.0000 | ORAL_TABLET | ORAL | Status: DC | PRN

## 2013-06-29 NOTE — Progress Notes (Signed)
   Subjective:    Patient ID: [name], male    DOB: 1965-09-24, 48 y.o.   MRN: [id number]  HPI This 48 y.o. male presents for evaluation of left knee pain and wants a rx for a left knee brace. He wants refill on pain medication.     Review of Systems No chest pain, SOB, HA, dizziness, vision change, N/V, diarrhea, constipation, dysuria, urinary urgency or frequency, myalgias, arthralgias or rash.     Objective:   Physical Exam  Vital signs noted  Well developed well nourished male.  HEENT - Head atraumatic Normocephalic Respiratory - Lungs CTA bilateral Cardiac - RRR S1 and S2 without murmur GI - Abdomen soft Nontender and bowel sounds active x 4 Extremities - No edema. Neuro - Grossly intact. MS - TTP left knee      Assessment & Plan:  Bilateral low back pain without sciatica - Plan: HYDROcodone-ibuprofen (VICOPROFEN) 7.5-200 MG per tablet  Knee pain, acute, left - Knee brace rx given to patient.  Lysbeth Penner FNP

## 2013-07-01 ENCOUNTER — Telehealth (INDEPENDENT_AMBULATORY_CARE_PROVIDER_SITE_OTHER): Payer: Self-pay | Admitting: *Deleted

## 2013-07-01 NOTE — Telephone Encounter (Signed)
[name] would like to know if his apt with [name] on 07/14/13 is needed. Could he have his blood work, if any needed, be done at his PCP office and [name] call with the results. This would save him a lot money to him. His return phone number if any questions is [phone].

## 2013-07-01 NOTE — Telephone Encounter (Signed)
Needs to keep OV apt. Can have labs drawn at his PCP. We will need copies. Please call him

## 2013-07-02 ENCOUNTER — Encounter (INDEPENDENT_AMBULATORY_CARE_PROVIDER_SITE_OTHER): Payer: Self-pay | Admitting: Internal Medicine

## 2013-07-02 NOTE — Telephone Encounter (Signed)
Chief would like to know if any labs are needed and if so, please fax them to Dr. Tawanna Sat office to get done before his apt. He was also advised to keep his apt in July. Voices understood.

## 2013-07-02 NOTE — Telephone Encounter (Signed)
LM for patient to return the call at [phone].

## 2013-07-02 NOTE — Telephone Encounter (Signed)
addressed

## 2013-07-14 ENCOUNTER — Ambulatory Visit (INDEPENDENT_AMBULATORY_CARE_PROVIDER_SITE_OTHER): Payer: Medicare Other | Admitting: Internal Medicine

## 2013-07-16 ENCOUNTER — Encounter (INDEPENDENT_AMBULATORY_CARE_PROVIDER_SITE_OTHER): Payer: Self-pay | Admitting: *Deleted

## 2013-07-16 ENCOUNTER — Telehealth (INDEPENDENT_AMBULATORY_CARE_PROVIDER_SITE_OTHER): Payer: Self-pay | Admitting: *Deleted

## 2013-07-16 NOTE — Telephone Encounter (Signed)
[name] No Showed for his apt with Deberah Castle, NP on 07/14/13. [name] has been sent to the patient.

## 2013-07-22 ENCOUNTER — Ambulatory Visit (INDEPENDENT_AMBULATORY_CARE_PROVIDER_SITE_OTHER): Payer: Medicare Other | Admitting: Internal Medicine

## 2013-07-22 ENCOUNTER — Encounter (INDEPENDENT_AMBULATORY_CARE_PROVIDER_SITE_OTHER): Payer: Self-pay | Admitting: Internal Medicine

## 2013-07-22 ENCOUNTER — Encounter (INDEPENDENT_AMBULATORY_CARE_PROVIDER_SITE_OTHER): Payer: Self-pay | Admitting: *Deleted

## 2013-07-22 VITALS — BP 104/70 | HR 84 | Temp 97.5°F | Ht 72.0 in | Wt >= 6400 oz

## 2013-07-22 DIAGNOSIS — K7030 Alcoholic cirrhosis of liver without ascites: Secondary | ICD-10-CM

## 2013-07-22 NOTE — Progress Notes (Signed)
Subjective:     Patient ID: [name], male   DOB: 02/13/1965, 48 y.o.   MRN: [id number]  HPI Here today for f/u of his cirrhosis secondary to NAFLD and prior hx of alcohol use.  He tells me he has been doing good.  He says he has had some swelling in his legs. He is trying to work in the yard.  There has been no weight gain. 1-2 BMs daily. No melena or bright red rectal bleeding.  Patient in room spitting in a empty plastic bottle (snuff).  He has not drank in over a year Patient viral markers for Hepatitis B and C have been negative.  Hx of thrombocytopenia related to his liver disease.   12/22/2012 US Liver: Severe hepatomegaly and splenomegaly. Echogenic liver disease consistent with chronic hepatic disease/fatty infiltration.    AFP was 5.1 on 06/11/2012     CBC    Component Value Date/Time   WBC 4.1 05/27/2013 1403   WBC 3.2* 04/22/2013 1645   WBC 2.8* 01/14/2012 0950   RBC 4.68 05/27/2013 1403   RBC 5.2 04/22/2013 1645   RBC 4.80 01/14/2012 0950   HGB 13.9 05/27/2013 1403   HGB 14.7 04/22/2013 1645   HCT 41.6 05/27/2013 1403   HCT 45.2 04/22/2013 1645   PLT 39* 05/27/2013 1403   MCV 89 05/27/2013 1403   MCV 87.6 04/22/2013 1645   MCH 29.7 05/27/2013 1403   MCH 28.5 04/22/2013 1645   MCH 29.2 01/14/2012 0950   MCHC 33.4 05/27/2013 1403   MCHC 32.6 04/22/2013 1645   MCHC 34.7 01/14/2012 0950   RDW 15.5* 05/27/2013 1403   RDW 14.7 01/14/2012 0950   LYMPHSABS 1.3 05/27/2013 1403   LYMPHSABS 1.1 11/14/2011 1416   MONOABS 0.4 11/14/2011 1416   EOSABS 0.1 05/27/2013 1403   EOSABS 0.2 11/14/2011 1416   BASOSABS 0.0 05/27/2013 1403   BASOSABS 0.0 11/14/2011 1416    CMP     Component Value Date/Time   NA 145* 05/27/2013 1403   NA 138 05/05/2012 1331   K 3.8 05/27/2013 1403   CL 107 05/27/2013 1403   CO2 22 05/27/2013 1403   GLUCOSE 80 05/27/2013 1403   GLUCOSE 84 05/05/2012 1331   BUN 16 05/27/2013 1403   BUN 16 05/05/2012 1331   CREATININE 0.93 05/27/2013 1403   CREATININE 0.87 05/05/2012  1331   CALCIUM 9.0 05/27/2013 1403   PROT 6.9 05/27/2013 1403   PROT 7.3 05/05/2012 1331   ALBUMIN 3.9 05/05/2012 1331   AST 49* 05/27/2013 1403   ALT 44 05/27/2013 1403   ALKPHOS 75 05/27/2013 1403   BILITOT 0.7 05/27/2013 1403   GFRNONAA 96 05/27/2013 1403   GFRNONAA >89 05/05/2012 1331   GFRAA 111 05/27/2013 1403   GFRAA >89 05/05/2012 1331      Review of Systems Past Medical History  Diagnosis Date  . Gout   . Diabetes mellitus     type 2  . RA (rheumatoid arthritis)   . COPD (chronic obstructive pulmonary disease)     with cpap  . Atrial fibrillation   . Cirrhosis   . Anxiety   . Heart murmur   . CHF (congestive heart failure)   . Constipation   . Sleep apnea     cpap used- level 10 and greater  . Neuromuscular disorder     neuropathy in hands and feet  . Cirrhosis     NASH  . Thrombocytopenia   . Genu varus  Past Surgical History  Procedure Laterality Date  . Sciatic nerve exploration    . Tonsillectomy    . Esophagogastroduodenoscopy  02/28/2011    Procedure: ESOPHAGOGASTRODUODENOSCOPY (EGD);  Surgeon: Rogene Houston, MD;  Location: [facility];  Service: Endoscopy;  Laterality: N/A;  1200  . Colonoscopy  05/08/2011    Procedure: COLONOSCOPY;  Surgeon: Rogene Houston, MD;  Location: [facility];  Service: Endoscopy;  Laterality: N/A;  730  . Hernia repair      as child  . Liver biopsy  2012  . Ankle surgery      right ankle talor repair  . Esophagogastroduodenoscopy N/A 06/11/2012    Procedure: ESOPHAGOGASTRODUODENOSCOPY (EGD);  Surgeon: Rogene Houston, MD;  Location: [facility];  Service: Endoscopy;  Laterality: N/A;  1200  FYI patient is 400 pounds    Allergies  Allergen Reactions  . Oxycodone Base Itching and Nausea And Vomiting  . Prozac [Fluoxetine Hcl] Itching    Current Outpatient Prescriptions on File Prior to Visit  Medication Sig Dispense Refill  . albuterol (PROVENTIL HFA;VENTOLIN HFA) 108 (90 BASE) MCG/ACT inhaler Inhale 2 puffs into  the lungs every 6 (six) hours as needed. For shortness of breath      . allopurinol (ZYLOPRIM) 300 MG tablet Take 300 mg by mouth daily.      Marland Kitchen ALPRAZolam (XANAX) 0.5 MG tablet Take 1 tablet (0.5 mg total) by mouth 2 (two) times daily as needed for anxiety.  30 tablet  3  . aspirin EC 81 MG tablet Take 81 mg by mouth once as needed.       . COLCRYS 0.6 MG tablet       . diltiazem (CARDIZEM) 90 MG tablet Take 1 tablet (90 mg total) by mouth 3 (three) times daily.  90 tablet  5  . doxepin (SINEQUAN) 10 MG capsule Take 1 capsule (10 mg total) by mouth at bedtime.  30 capsule  5  . esomeprazole (NEXIUM) 40 MG capsule Take 1 capsule (40 mg total) by mouth daily.  30 capsule  5  . ezetimibe (ZETIA) 10 MG tablet Take 1 tablet (10 mg total) by mouth daily.  30 tablet  5  . fluorometholone (FML) 0.1 % ophthalmic suspension       . fluticasone (FLONASE) 50 MCG/ACT nasal spray Place 2 sprays into both nostrils daily as needed. For allergies  1 g  5  . folic acid (FOLVITE) 333 MCG tablet Take 400 mcg by mouth daily.      Marland Kitchen HYDROcodone-ibuprofen (VICOPROFEN) 7.5-200 MG per tablet Take 1 tablet by mouth every 4 (four) hours as needed for moderate pain.  90 tablet  0  . hydrocortisone 1 % ointment Apply 1 application topically 3 (three) times daily as needed. Patient states that this is a 2% Oint.  30 g  1  . ipratropium (ATROVENT) 0.02 % nebulizer solution Take 500 mcg by nebulization 4 (four) times daily as needed. For wheezing      . lactulose (CHRONULAC) 10 GM/15ML solution Take 15 mLs (10 g total) by mouth 2 (two) times daily.  960 mL  5  . lidocaine (XYLOCAINE) 5 % ointment Apply 1 application topically 4 (four) times daily as needed. To knees and ankle as needed for pain  35.44 g  2  . Menthol-Methyl Salicylate (MUSCLE RUB) 10-15 % CREA Apply 1 application topically as needed. For muscle pain      . metFORMIN (GLUCOPHAGE) 500 MG tablet TAKE TWO TABLETS  BY MOUTH TWICE DAILY  120 tablet  0  . metolazone  (ZAROXOLYN) 2.5 MG tablet Take one po one half hour prior to torsemide for 3 days prn for severe swelling  30 tablet  0  . milk thistle 175 MG tablet Take 175 mg by mouth 4 (four) times daily.       . Multiple Vitamin (MULITIVITAMIN WITH MINERALS) TABS Take 1 tablet by mouth daily.      Marland Kitchen neomycin-bacitracin-polymyxin (NEOSPORIN) ointment Apply 1 application topically every 12 (twelve) hours as needed. For cut on ankle      . nitroGLYCERIN (NITROSTAT) 0.4 MG SL tablet Place 1 tablet (0.4 mg total) under the tongue every 5 (five) minutes as needed for chest pain (may repeat x3).  10 tablet  2  . phenylephrine (NASAL DECONGESTANT) 1 % nasal spray Place 1 drop into the nose every 4 (four) hours as needed. For allergies      . potassium chloride (MICRO-K) 10 MEQ CR capsule Take 1 capsule (10 mEq total) by mouth daily.  30 capsule  5  . pregabalin (LYRICA) 300 MG capsule Take 1 capsule (300 mg total) by mouth 2 (two) times daily. One in am and two and night  60 capsule  5  . testosterone (ANDROGEL) 50 MG/5GM GEL Place 5 g onto the skin daily.  5 g  5  . torsemide (DEMADEX) 20 MG tablet Take 1 tablet (20 mg total) by mouth as directed. 2 tabs in the morning and 1 tab 6 hours later  90 tablet  5  . vitamin B-12 (CYANOCOBALAMIN) 100 MCG tablet Take 0.5 tablets (50 mcg total) by mouth daily.  30 tablet  11   No current facility-administered medications on file prior to visit.        Objective:   Physical Exam  Filed Vitals:   07/22/13 1027  BP: 104/70  Pulse: 84  Temp: 97.5 F (36.4 C)  Height: 6' (1.829 m)  Weight: 419 lb 12.8 oz (190.42 kg)   Alert and oriented. Skin warm and dry. Oral mucosa is moist.   . Sclera anicteric, conjunctivae is pink. Thyroid not enlarged. No cervical lymphadenopathy. Lungs clear. Heart regular rate and rhythm.  Abdomen is soft. Bowel sounds are positive.Grossly obese. Umbilical hernia noted No abdominal masses felt. No tenderness.  2+ edema to lower extremities.       Assessment:     NAFLD/etoh. He has not lost any weight. Not exercising.  He has problems with his knees.    Plan:    US abdomen which will be done at [facility]. AFP, PT/INR. OV in 6 months with Dr Laural Golden

## 2013-07-23 LAB — PROTIME-INR
INR: 1.22 (ref <1.50)
PROTHROMBIN TIME: 15.4 s — AB (ref 11.6–15.2)

## 2013-07-23 LAB — AFP TUMOR MARKER: AFP-Tumor Marker: 9.2 ng/mL — ABNORMAL HIGH (ref 0.0–8.0)

## 2013-07-26 ENCOUNTER — Ambulatory Visit: Admit: 2013-07-26 | Payer: Self-pay | Admitting: *Deleted

## 2013-07-26 ENCOUNTER — Ambulatory Visit (HOSPITAL_COMMUNITY)
Admission: AD | Admit: 2013-07-26 | Discharge: 2013-07-26 | Disposition: A | Discharge status: Home / Self Care | Payer: Medicare Other | Source: Other Acute Inpatient Hospital | Attending: Family Medicine | Admitting: Family Medicine

## 2013-07-26 DIAGNOSIS — A419 Sepsis, unspecified organism: Secondary | ICD-10-CM | POA: Insufficient documentation

## 2013-07-29 ENCOUNTER — Telehealth (INDEPENDENT_AMBULATORY_CARE_PROVIDER_SITE_OTHER): Payer: Self-pay | Admitting: *Deleted

## 2013-07-29 NOTE — Telephone Encounter (Signed)
Voice Mail left 11:16  He is IP ar [facility]  # [phone]

## 2013-07-29 NOTE — Telephone Encounter (Signed)
Patient is presently at [facility] admitted with sepsis ? Etiology.

## 2013-07-30 ENCOUNTER — Telehealth: Payer: Self-pay | Admitting: Cardiology

## 2013-07-30 DIAGNOSIS — G894 Chronic pain syndrome: Secondary | ICD-10-CM | POA: Insufficient documentation

## 2013-07-30 DIAGNOSIS — D696 Thrombocytopenia, unspecified: Secondary | ICD-10-CM | POA: Insufficient documentation

## 2013-07-30 DIAGNOSIS — R609 Edema, unspecified: Secondary | ICD-10-CM | POA: Insufficient documentation

## 2013-07-30 NOTE — Telephone Encounter (Signed)
[name] is the Hospital at [facility] and called and said the hospitalist wants to know if it would be ok to put him on coumadin for an irregular heartbeat  Possibly some a fibb

## 2013-07-30 NOTE — Telephone Encounter (Signed)
New problem:      Pt needs a call back he has questions about his medication please. Post hosp.

## 2013-07-31 ENCOUNTER — Other Ambulatory Visit: Payer: Self-pay | Admitting: Family Medicine

## 2013-07-31 ENCOUNTER — Telehealth: Payer: Self-pay | Admitting: Family Medicine

## 2013-07-31 MED ORDER — PREGABALIN 300 MG PO CAPS: 300.0000 mg | ORAL_CAPSULE | Freq: Two times a day (BID) | ORAL | Status: DC

## 2013-07-31 NOTE — Telephone Encounter (Addendum)
I don't see that lyrica has been requested.  Last filled 01/09/13.

## 2013-08-03 ENCOUNTER — Ambulatory Visit (INDEPENDENT_AMBULATORY_CARE_PROVIDER_SITE_OTHER): Payer: Medicare Other | Admitting: Family Medicine

## 2013-08-03 ENCOUNTER — Ambulatory Visit (INDEPENDENT_AMBULATORY_CARE_PROVIDER_SITE_OTHER): Payer: Medicare Other | Admitting: Internal Medicine

## 2013-08-03 ENCOUNTER — Encounter: Payer: Self-pay | Admitting: Family Medicine

## 2013-08-03 VITALS — BP 155/98 | HR 97 | Temp 98.1°F | Ht 72.0 in | Wt >= 6400 oz

## 2013-08-03 DIAGNOSIS — R5383 Other fatigue: Principal | ICD-10-CM

## 2013-08-03 DIAGNOSIS — M545 Low back pain, unspecified: Secondary | ICD-10-CM

## 2013-08-03 DIAGNOSIS — F411 Generalized anxiety disorder: Secondary | ICD-10-CM

## 2013-08-03 DIAGNOSIS — R5381 Other malaise: Secondary | ICD-10-CM

## 2013-08-03 LAB — POCT CBC
Granulocyte percent: 67.9 %G (ref 37–80)
HCT, POC: 39.4 % — AB (ref 43.5–53.7)
Hemoglobin: 13.3 g/dL — AB (ref 14.1–18.1)
Lymph, poc: 1.1 (ref 0.6–3.4)
MCH, POC: 30.2 pg (ref 27–31.2)
MCHC: 33.8 g/dL (ref 31.8–35.4)
MCV: 89.5 fL (ref 80–97)
MPV: 7.9 fL (ref 0–99.8)
POC Granulocyte: 3.1 (ref 2–6.9)
POC LYMPH PERCENT: 24.4 %L (ref 10–50)
Platelet Count, POC: 96 10*3/uL — AB (ref 142–424)
RBC: 4.4 M/uL — AB (ref 4.69–6.13)
RDW, POC: 14.1 %
WBC: 4.5 10*3/uL — AB (ref 4.6–10.2)

## 2013-08-03 MED ORDER — ALPRAZOLAM 0.5 MG PO TABS: 0.5000 mg | ORAL_TABLET | Freq: Two times a day (BID) | ORAL | Status: DC | PRN

## 2013-08-03 MED ORDER — HYDROCODONE-IBUPROFEN 7.5-200 MG PO TABS: 1.0000 | ORAL_TABLET | ORAL | Status: DC | PRN

## 2013-08-03 NOTE — Progress Notes (Signed)
Subjective:    Patient ID: [name], male    DOB: 01/06/1966, 48 y.o.   MRN: [id number]  HPI This 48 y.o. male presents for evaluation of follow up from hospital.  He was hospitalized at [facility] in the ICU.  He had sepsis and he was on pressors and he was on IV abx's and was DC'd home on clindamycin.  He has been doing better.    Review of Systems No chest pain, SOB, HA, dizziness, vision change, N/V, diarrhea, constipation, dysuria, urinary urgency or frequency, myalgias, arthralgias or rash.     Objective:   Physical Exam  Vital signs noted  Well developed well nourished male.  HEENT - Head atraumatic Normocephalic                Eyes - PERRLA, Conjuctiva - clear Sclera- Clear EOMI                Ears - EAC's Wnl TM's Wnl Gross Hearing WNL                Nose - Nares patent                 Throat - oropharanx wnl Respiratory - Lungs CTA bilateral Cardiac - RRR S1 and S2 without murmur GI - Abdomen soft Nontender and bowel sounds active x 4 Extremities - No edema. Neuro - Grossly intact.      Assessment & Plan:  Other malaise and fatigue - Plan: CMP14+EGFR, POCT CBC  Bilateral low back pain without sciatica - Plan: HYDROcodone-ibuprofen (VICOPROFEN) 7.5-200 MG per tablet  Anxiety state, unspecified - Plan: ALPRAZolam (XANAX) 0.5 MG tablet  Sepsis - Resolved and follow up prn if having any worsening sx's.  William J Oxford FNP 

## 2013-08-04 ENCOUNTER — Ambulatory Visit: Payer: Medicare Other | Admitting: Family Medicine

## 2013-08-04 ENCOUNTER — Telehealth: Payer: Self-pay | Admitting: Family Medicine

## 2013-08-05 ENCOUNTER — Other Ambulatory Visit: Payer: Self-pay | Admitting: Family Medicine

## 2013-08-05 DIAGNOSIS — I2789 Other specified pulmonary heart diseases: Secondary | ICD-10-CM

## 2013-08-05 LAB — CMP14+EGFR
ALT: 34 IU/L (ref 0–44)
AST: 49 IU/L — ABNORMAL HIGH (ref 0–40)
Albumin/Globulin Ratio: 1 — ABNORMAL LOW (ref 1.1–2.5)
Albumin: 3.9 g/dL (ref 3.5–5.5)
Alkaline Phosphatase: 77 IU/L (ref 39–117)
BUN/Creatinine Ratio: 16 (ref 9–20)
BUN: 13 mg/dL (ref 6–24)
CO2: 23 mmol/L (ref 18–29)
Calcium: 8.7 mg/dL (ref 8.7–10.2)
Chloride: 91 mmol/L — ABNORMAL LOW (ref 97–108)
Creatinine, Ser: 0.82 mg/dL (ref 0.76–1.27)
GFR calc Af Amer: 120 mL/min/{1.73_m2} (ref >59)
GFR calc non Af Amer: 104 mL/min/{1.73_m2} (ref >59)
Globulin, Total: 3.8 g/dL (ref 1.5–4.5)
Glucose: 112 mg/dL — ABNORMAL HIGH (ref 65–99)
Potassium: 3.1 mmol/L — ABNORMAL LOW (ref 3.5–5.2)
Sodium: 134 mmol/L (ref 134–144)
Total Bilirubin: 1.1 mg/dL (ref 0.0–1.2)
Total Protein: 7.7 g/dL (ref 6.0–8.5)

## 2013-08-05 MED ORDER — BETAMETHASONE DIPROPIONATE 0.05 % EX CREA: TOPICAL_CREAM | Freq: Two times a day (BID) | CUTANEOUS | Status: DC

## 2013-08-05 MED ORDER — POTASSIUM CHLORIDE ER 10 MEQ PO CPCR: 20.0000 meq | ORAL_CAPSULE | Freq: Every day | ORAL | Status: DC

## 2013-08-05 NOTE — Telephone Encounter (Signed)
Med sent to pharm 

## 2013-08-06 NOTE — Telephone Encounter (Signed)
Pt.notified

## 2013-08-11 ENCOUNTER — Ambulatory Visit: Payer: Medicare Other | Admitting: Family Medicine

## 2013-08-12 ENCOUNTER — Encounter: Payer: Self-pay | Admitting: Family Medicine

## 2013-08-24 ENCOUNTER — Telehealth: Payer: Self-pay | Admitting: Family Medicine

## 2013-08-24 DIAGNOSIS — M545 Low back pain, unspecified: Secondary | ICD-10-CM

## 2013-08-24 MED ORDER — HYDROCODONE-IBUPROFEN 7.5-200 MG PO TABS: 1.0000 | ORAL_TABLET | ORAL | Status: DC | PRN

## 2013-08-24 NOTE — Telephone Encounter (Signed)
This is ok to refill x1

## 2013-08-24 NOTE — Telephone Encounter (Signed)
Will you please review since [facility] is on vacation

## 2013-08-24 NOTE — Telephone Encounter (Signed)
rx up front and patient aware

## 2013-08-28 ENCOUNTER — Telehealth: Payer: Self-pay | Admitting: *Deleted

## 2013-08-28 ENCOUNTER — Telehealth: Payer: Self-pay | Admitting: Family Medicine

## 2013-08-28 MED ORDER — TRAZODONE HCL 50 MG PO TABS: 50.0000 mg | ORAL_TABLET | Freq: Every day | ORAL | Status: DC

## 2013-08-28 NOTE — Telephone Encounter (Signed)
Patient aware.

## 2013-08-28 NOTE — Telephone Encounter (Signed)
Patient is not really sleeping well with the doxepin and has some trazadone left at home and wants to know if he can switch back to trazadone. Please advise

## 2013-08-28 NOTE — Telephone Encounter (Signed)
Received fax from pharmacy requesting a refill on Magnesium oxide 473m Tabs #28. Do not see on current med list or in history. Please advise on refill

## 2013-08-28 NOTE — Telephone Encounter (Signed)
New trazodone rx sent to pharmacy.

## 2013-08-31 ENCOUNTER — Other Ambulatory Visit: Payer: Self-pay | Admitting: Family Medicine

## 2013-08-31 MED ORDER — MAGNESIUM 400 MG PO CAPS: 400.0000 mg | ORAL_CAPSULE | Freq: Two times a day (BID) | ORAL | Status: DC

## 2013-08-31 NOTE — Telephone Encounter (Signed)
rx of magnesium sent to pharmacy

## 2013-09-03 ENCOUNTER — Encounter: Payer: Self-pay | Admitting: Family Medicine

## 2013-09-03 ENCOUNTER — Telehealth: Payer: Self-pay | Admitting: Family Medicine

## 2013-09-03 ENCOUNTER — Ambulatory Visit (INDEPENDENT_AMBULATORY_CARE_PROVIDER_SITE_OTHER): Payer: Medicare Other | Admitting: Family Medicine

## 2013-09-03 VITALS — BP 174/103 | HR 67 | Temp 97.4°F | Ht 72.0 in | Wt >= 6400 oz

## 2013-09-03 DIAGNOSIS — J209 Acute bronchitis, unspecified: Secondary | ICD-10-CM

## 2013-09-03 DIAGNOSIS — J208 Acute bronchitis due to other specified organisms: Secondary | ICD-10-CM

## 2013-09-03 MED ORDER — AZITHROMYCIN 250 MG PO TABS: ORAL_TABLET | ORAL | Status: DC

## 2013-09-03 MED ORDER — METHYLPREDNISOLONE ACETATE 80 MG/ML IJ SUSP
80.0000 mg | Freq: Once | INTRAMUSCULAR | Status: AC
  Administered 2013-09-03: 80 mg via INTRAMUSCULAR

## 2013-09-03 NOTE — Progress Notes (Signed)
   Subjective:    Patient ID: [name], male    DOB: 1965/12/23, 48 y.o.   MRN: [id number]  HPI This 48 y.o. male presents for evaluation of cough and SOB.  He states he was exposed to black mold and he has developed some uri sx's.   Review of Systems No chest pain, SOB, HA, dizziness, vision change, N/V, diarrhea, constipation, dysuria, urinary urgency or frequency, myalgias, arthralgias or rash.     Objective:   Physical Exam Vital signs noted  Well developed well nourished male.  HEENT - Head atraumatic Normocephalic                Eyes - PERRLA, Conjuctiva - clear Sclera- Clear EOMI                Ears - EAC's Wnl TM's Wnl Gross Hearing WNL                Nose - Nares patent                 Throat - oropharanx wnl Respiratory - Lungs CTA bilateral Cardiac - RRR S1 and S2 without murmur GI - Abdomen soft Nontender and bowel sounds active x 4 Extremities - No edema. Neuro - Grossly intact.       Assessment & Plan:  Acute bronchitis due to other specified organisms - Plan: methylPREDNISolone acetate (DEPO-MEDROL) injection 80 mg, azithromycin (ZITHROMAX) 250 MG tablet  Lysbeth Penner FNP

## 2013-09-03 NOTE — Telephone Encounter (Signed)
Patient given appt at 8:45

## 2013-09-05 ENCOUNTER — Other Ambulatory Visit: Payer: Self-pay | Admitting: General Practice

## 2013-09-07 ENCOUNTER — Other Ambulatory Visit: Payer: Self-pay | Admitting: Family Medicine

## 2013-09-07 ENCOUNTER — Telehealth: Payer: Self-pay | Admitting: Family Medicine

## 2013-09-07 NOTE — Telephone Encounter (Signed)
Too early for refill

## 2013-09-07 NOTE — Telephone Encounter (Signed)
Spoke with pt regarding RX for Hydrocodone Pt stated he always gets refill at the 1st of the month He stated that he did not get a refill on 8/17 [facility] filled at 08/27/2013 Pt denied having presciption filled Informed that Dietrich Pates will not refill at this time

## 2013-09-07 NOTE — Telephone Encounter (Signed)
Patient called and requested prescription refill on his hydrocodine.

## 2013-09-08 ENCOUNTER — Other Ambulatory Visit: Payer: Self-pay | Admitting: Family Medicine

## 2013-09-08 ENCOUNTER — Telehealth: Payer: Self-pay | Admitting: Pulmonary Disease

## 2013-09-08 ENCOUNTER — Telehealth: Payer: Self-pay | Admitting: Cardiology

## 2013-09-08 MED ORDER — FLUTICASONE PROPIONATE 50 MCG/ACT NA SUSP: 2.0000 | Freq: Every day | NASAL | Status: DC | PRN

## 2013-09-08 NOTE — Telephone Encounter (Signed)
Afrin ok  Stay on flonase

## 2013-09-08 NOTE — Telephone Encounter (Signed)
New message           Pt needs written permission to be put under anesthesia for hernia surgery

## 2013-09-08 NOTE — Telephone Encounter (Signed)
Pt returning call [phone]

## 2013-09-08 NOTE — Telephone Encounter (Signed)
Called made pt aware of recs. Nothing further needed

## 2013-09-08 NOTE — Telephone Encounter (Signed)
lmomtcb x1 

## 2013-09-08 NOTE — Telephone Encounter (Signed)
Called spoke with pt. He c/o x4-5 months, his nasal passages feel clogged/swollen. He has been using nasal sprays and it helps. Per pt the past week pt feels like he can't breathe through his nose, which is causing him to feel SOB. He wants RX for OTC nasal spray. Pt reports he is not able to come to [place] for appt.  Please advise RA thanks

## 2013-09-09 ENCOUNTER — Ambulatory Visit (INDEPENDENT_AMBULATORY_CARE_PROVIDER_SITE_OTHER): Payer: Medicare Other | Admitting: Family Medicine

## 2013-09-09 ENCOUNTER — Encounter: Payer: Self-pay | Admitting: Family Medicine

## 2013-09-09 ENCOUNTER — Ambulatory Visit (INDEPENDENT_AMBULATORY_CARE_PROVIDER_SITE_OTHER): Payer: Medicare Other

## 2013-09-09 ENCOUNTER — Encounter: Payer: Self-pay | Admitting: *Deleted

## 2013-09-09 VITALS — BP 160/90 | HR 75 | Temp 97.0°F | Ht 72.0 in | Wt >= 6400 oz

## 2013-09-09 DIAGNOSIS — R0602 Shortness of breath: Secondary | ICD-10-CM

## 2013-09-09 DIAGNOSIS — J209 Acute bronchitis, unspecified: Secondary | ICD-10-CM

## 2013-09-09 MED ORDER — PREDNISONE 10 MG PO TABS: ORAL_TABLET | ORAL | Status: DC

## 2013-09-09 MED ORDER — AZITHROMYCIN 250 MG PO TABS: ORAL_TABLET | ORAL | Status: DC

## 2013-09-09 NOTE — Telephone Encounter (Signed)
The patient has no contraindications to surgery although there is some risk of arrhythmias since he has had atrial fib and some risk of volume issues with his diastolic dysfunction.  No further cardiac testing is indicated however.

## 2013-09-09 NOTE — Progress Notes (Signed)
   Subjective:    Patient ID: [name], male    DOB: Sep 29, 1965, 48 y.o.   MRN: [id number]  HPI  C/o cough and uri sx's.  He states he has hard time breathing out of his nose.  He states he has feeling like he cannot get enough air.  He has been using afrin nasal spray and states he was unable to sleep last night.  He has OSA and states he was using full face mask because of difficulty breathing through his nose (he uses nasal cpap also) for his cpap and was having difficulty with sleep.  He has insomnia and does use trazadone but states he used 2/3 of 150 dose which is from an old rx.  He has current rx of 56m of trazadone which he states has worn out.  He has chronic pain and has ran out of his pain meds early and called yesterday wanting early rx on pain meds.  He sees pulmonary and was taken off his oxygen and is wanting to go back on oxygen because he states it made him feel better. He has an umbilical hernia and states he has to await letter from pulmonary and cardiology before he can have surgery.  He is anxious.  Review of Systems No chest pain, SOB, HA, dizziness, vision change, N/V, diarrhea, constipation, dysuria, urinary urgency or frequency, myalgias, arthralgias or rash.     Objective:   Physical Exam  Vital signs noted  Obese anxious male  HEENT - Head atraumatic Normocephalic                Eyes - PERRLA, Conjuctiva - clear Sclera- Clear EOMI                Ears - EAC's Wnl TM's Wnl Gross Hearing WNL                Nose - Nares with injection and decreased patency                Throat - oropharanx wnl Respiratory - Lungs CTA bilateral Cardiac - RRR S1 and S2 without murmur GI - Abdomen obese soft Nontender and bowel sounds active x 4.  Umbilical hernia w/o encarceration or pain with palpation Extremities - Edema in bilateral lower extremities. Neuro - Grossly intact.      Assessment & Plan:  SOB (shortness of breath) - Plan: DG Chest 2 View, predniSONE  (DELTASONE) 10 MG tablet, azithromycin (ZITHROMAX) 250 MG tablet  Acute bronchitis, unspecified organism - Plan: predniSONE (DELTASONE) 10 MG tablet, azithromycin (ZITHROMAX) 250 MG tablet  Increase prednisone taper to 40x2 30x2 20 x 2 10x2 and see if this helps, add zpak abx, continue with neb tx's.  Neb tx given in clinic today and seems to help.    Insomnia - Informed patient to wear his face mask for cpap and use trazadone 150 for sleep  Chronic pain - Discussed he is too early for pain medication and will need to wait.  COPD - Follow up with pulmonary for oxygen request.  Discussed that he is not hypoxic and do not see a need for oxygen.  Discussed with patient that pulmonary would have to manage this and to discuss his concerns with them.  Rhinitis- Do not use anymore of the afrin since he is anxious and is having difficulty sleeping and I think his nasal passages are adequately patent.  WLysbeth PennerFNP

## 2013-09-09 NOTE — Telephone Encounter (Signed)
Pt needs permission for anesthesia for hernia surgery

## 2013-09-11 ENCOUNTER — Telehealth (INDEPENDENT_AMBULATORY_CARE_PROVIDER_SITE_OTHER): Payer: Self-pay | Admitting: *Deleted

## 2013-09-11 NOTE — Telephone Encounter (Signed)
[facility] is needing a Surgery Clearance Letter stating he is okay to have hernia surgery. The only thing Dr. Laural Golden stated was, he will need plates before surgery. The letter will need to be addressed to [facility] in [place]. His return phone number is [phone].

## 2013-09-17 ENCOUNTER — Encounter (INDEPENDENT_AMBULATORY_CARE_PROVIDER_SITE_OTHER): Payer: Self-pay | Admitting: *Deleted

## 2013-09-20 NOTE — Telephone Encounter (Signed)
Letter sent.

## 2013-09-21 ENCOUNTER — Encounter (INDEPENDENT_AMBULATORY_CARE_PROVIDER_SITE_OTHER): Payer: Self-pay | Admitting: *Deleted

## 2013-09-21 ENCOUNTER — Telehealth: Payer: Self-pay | Admitting: Family Medicine

## 2013-09-21 NOTE — Telephone Encounter (Signed)
Patient states that he was around someone who was exposed to TB what should he do

## 2013-09-23 ENCOUNTER — Other Ambulatory Visit: Payer: Self-pay | Admitting: Family Medicine

## 2013-09-23 ENCOUNTER — Telehealth: Payer: Self-pay | Admitting: Family Medicine

## 2013-09-23 DIAGNOSIS — M545 Low back pain, unspecified: Secondary | ICD-10-CM

## 2013-09-23 MED ORDER — HYDROCODONE-IBUPROFEN 7.5-200 MG PO TABS: 1.0000 | ORAL_TABLET | ORAL | Status: DC | PRN

## 2013-09-23 NOTE — Telephone Encounter (Signed)
Get a tb skin test

## 2013-09-23 NOTE — Telephone Encounter (Signed)
Patient said it was a mis understanding nobody had been in contact with TB

## 2013-10-05 ENCOUNTER — Other Ambulatory Visit: Payer: Self-pay | Admitting: Nurse Practitioner

## 2013-10-07 ENCOUNTER — Other Ambulatory Visit: Payer: Self-pay | Admitting: Family Medicine

## 2013-10-07 ENCOUNTER — Ambulatory Visit: Payer: Medicare Other | Admitting: Family Medicine

## 2013-10-07 ENCOUNTER — Telehealth: Payer: Self-pay | Admitting: Family Medicine

## 2013-10-07 DIAGNOSIS — G894 Chronic pain syndrome: Secondary | ICD-10-CM

## 2013-10-07 NOTE — Telephone Encounter (Signed)
No it is too soon for hydrocodone rx.  Tell patient I am going to refer him to pain management and this will work betterfor him

## 2013-10-08 ENCOUNTER — Other Ambulatory Visit: Payer: Self-pay | Admitting: Family Medicine

## 2013-10-08 ENCOUNTER — Encounter: Payer: Self-pay | Admitting: *Deleted

## 2013-10-08 DIAGNOSIS — M545 Low back pain, unspecified: Secondary | ICD-10-CM

## 2013-10-08 NOTE — Telephone Encounter (Signed)
Patient aware.

## 2013-10-08 NOTE — Telephone Encounter (Signed)
I am going to be in the office on 10/26/13.  He cannot get it filled any earlier than 10/20.  He is going to be referred to a pain clinic.  I put referral in yesterday.  Michelle cannot sign his narcotic pain rx's and therefore he will need a new prescriber because I am not doing pain management.  Tell him he has called early for pain medicine for the last 3 months and I think that he may need more help with his pain control.  I will not be managing his pain anymore and I sent referral in.

## 2013-10-08 NOTE — Telephone Encounter (Signed)
Patient said he was told you were going on vacation that's why he requested earlier but put do not fill until the due date. Also he said he sees michelle here for pain management.

## 2013-10-15 ENCOUNTER — Telehealth: Payer: Self-pay | Admitting: Family Medicine

## 2013-10-15 NOTE — Telephone Encounter (Signed)
Patient advised to go to  ER

## 2013-10-21 ENCOUNTER — Telehealth: Payer: Self-pay | Admitting: Family Medicine

## 2013-10-21 ENCOUNTER — Other Ambulatory Visit: Payer: Self-pay | Admitting: Family Medicine

## 2013-10-21 DIAGNOSIS — M545 Low back pain, unspecified: Secondary | ICD-10-CM

## 2013-10-21 MED ORDER — HYDROCODONE-IBUPROFEN 7.5-200 MG PO TABS: 1.0000 | ORAL_TABLET | ORAL | Status: DC | PRN

## 2013-10-21 NOTE — Telephone Encounter (Signed)
I am not going to change his medication and he can come in and pick it up on 10/23/13

## 2013-10-22 ENCOUNTER — Ambulatory Visit (INDEPENDENT_AMBULATORY_CARE_PROVIDER_SITE_OTHER): Payer: Medicare Other | Admitting: Internal Medicine

## 2013-10-22 NOTE — Telephone Encounter (Signed)
Patient notified. Prescription is available for pickup at front desk.

## 2013-10-23 ENCOUNTER — Other Ambulatory Visit: Payer: Self-pay

## 2013-10-26 ENCOUNTER — Other Ambulatory Visit: Payer: Self-pay | Admitting: Family Medicine

## 2013-10-26 ENCOUNTER — Ambulatory Visit (INDEPENDENT_AMBULATORY_CARE_PROVIDER_SITE_OTHER): Payer: Medicare Other | Admitting: Internal Medicine

## 2013-10-27 NOTE — Telephone Encounter (Signed)
Last seen 09/09/13  B Oxford  Last glucose 08/03/13

## 2013-11-03 ENCOUNTER — Telehealth: Payer: Self-pay | Admitting: Family Medicine

## 2013-11-03 ENCOUNTER — Telehealth (INDEPENDENT_AMBULATORY_CARE_PROVIDER_SITE_OTHER): Payer: Self-pay | Admitting: *Deleted

## 2013-11-03 ENCOUNTER — Ambulatory Visit (INDEPENDENT_AMBULATORY_CARE_PROVIDER_SITE_OTHER): Payer: Medicare Other | Admitting: Family Medicine

## 2013-11-03 VITALS — BP 135/83 | HR 65 | Temp 97.0°F | Ht 72.0 in | Wt >= 6400 oz

## 2013-11-03 DIAGNOSIS — K219 Gastro-esophageal reflux disease without esophagitis: Secondary | ICD-10-CM

## 2013-11-03 DIAGNOSIS — I10 Essential (primary) hypertension: Secondary | ICD-10-CM

## 2013-11-03 DIAGNOSIS — R162 Hepatomegaly with splenomegaly, not elsewhere classified: Secondary | ICD-10-CM

## 2013-11-03 DIAGNOSIS — R161 Splenomegaly, not elsewhere classified: Secondary | ICD-10-CM

## 2013-11-03 DIAGNOSIS — I482 Chronic atrial fibrillation, unspecified: Secondary | ICD-10-CM

## 2013-11-03 DIAGNOSIS — F411 Generalized anxiety disorder: Secondary | ICD-10-CM

## 2013-11-03 DIAGNOSIS — R160 Hepatomegaly, not elsewhere classified: Secondary | ICD-10-CM

## 2013-11-03 DIAGNOSIS — K921 Melena: Secondary | ICD-10-CM

## 2013-11-03 DIAGNOSIS — D696 Thrombocytopenia, unspecified: Secondary | ICD-10-CM

## 2013-11-03 DIAGNOSIS — I25709 Atherosclerosis of coronary artery bypass graft(s), unspecified, with unspecified angina pectoris: Secondary | ICD-10-CM

## 2013-11-03 DIAGNOSIS — D72819 Decreased white blood cell count, unspecified: Secondary | ICD-10-CM

## 2013-11-03 DIAGNOSIS — R748 Abnormal levels of other serum enzymes: Secondary | ICD-10-CM

## 2013-11-03 DIAGNOSIS — E119 Type 2 diabetes mellitus without complications: Secondary | ICD-10-CM

## 2013-11-03 DIAGNOSIS — E785 Hyperlipidemia, unspecified: Secondary | ICD-10-CM

## 2013-11-03 DIAGNOSIS — J441 Chronic obstructive pulmonary disease with (acute) exacerbation: Secondary | ICD-10-CM

## 2013-11-03 LAB — POCT GLYCOSYLATED HEMOGLOBIN (HGB A1C): Hemoglobin A1C: 5.9

## 2013-11-03 MED ORDER — ESOMEPRAZOLE MAGNESIUM 40 MG PO CPDR: 40.0000 mg | DELAYED_RELEASE_CAPSULE | Freq: Every day | ORAL | Status: DC

## 2013-11-03 MED ORDER — AMOXICILLIN 875 MG PO TABS: 875.0000 mg | ORAL_TABLET | Freq: Two times a day (BID) | ORAL | Status: DC

## 2013-11-03 MED ORDER — DILTIAZEM HCL 90 MG PO TABS: 90.0000 mg | ORAL_TABLET | Freq: Three times a day (TID) | ORAL | Status: DC

## 2013-11-03 MED ORDER — METFORMIN HCL 500 MG PO TABS: 1000.0000 mg | ORAL_TABLET | Freq: Two times a day (BID) | ORAL | Status: DC

## 2013-11-03 MED ORDER — ALPRAZOLAM 0.5 MG PO TABS: 0.5000 mg | ORAL_TABLET | Freq: Two times a day (BID) | ORAL | Status: DC | PRN

## 2013-11-03 MED ORDER — EZETIMIBE 10 MG PO TABS: 10.0000 mg | ORAL_TABLET | Freq: Every day | ORAL | Status: DC

## 2013-11-03 MED ORDER — METHYLPREDNISOLONE (PAK) 4 MG PO TABS: ORAL_TABLET | ORAL | Status: DC

## 2013-11-03 NOTE — Progress Notes (Signed)
Subjective:    Patient ID: [name], male    DOB: 08/06/1965, 48 y.o.   MRN: [id number]  HPI  Patient is here for follow up. He has hx of copd and states he has been SOB and has been wheezing a lot and especially at HS.  He is needing refills.  He is due for labs.  He has diabetes.  He has atrial fibrillation and CAD.  He has liver disease and sees GI.  He has GAD and needs refills on xanax.  Review of Systems    No chest pain, SOB, HA, dizziness, vision change, N/V, diarrhea, constipation, dysuria, urinary urgency or frequency, myalgias, arthralgias or rash.  Objective:   Physical Exam  Vital signs noted  Well developed well nourished male.  HEENT - Head atraumatic Normocephalic                Eyes - PERRLA, Conjuctiva - clear Sclera- Clear EOMI                Ears - EAC's Wnl TM's Wnl Gross Hearing WNL                Nose - Nares patent                 Throat - oropharanx wnl Respiratory - Lungs CTA bilateral Cardiac - RRR S1 and S2 without murmur GI - Abdomen soft Nontender and bowel sounds active x 4 Extremities - No edema. Neuro - Grossly intact.     Assessment & Plan:  Type 2 diabetes mellitus without complication - Plan: POCT glycosylated hemoglobin (Hb A1C), CMP14+EGFR, Lipid panel  Hyperlipemia - Plan: CMP14+EGFR, Lipid panel  Essential hypertension, benign - Plan: POCT CBC, CMP14+EGFR  Black tarry stools - Plan: Ambulatory referral to Gastroenterology  Hepatosplenomegaly  Elevated liver enzymes  Enlarged liver  Thrombocytopenia  Leukopenia  Splenomegaly  GAD (generalized anxiety disorder) - Plan: ALPRAZolam (XANAX) 0.5 MG tablet  Essential hypertension  Chronic atrial fibrillation - Plan: diltiazem (CARDIZEM) 90 MG tablet  Coronary artery disease involving coronary bypass graft with unspecified angina pectoris - Plan: diltiazem (CARDIZEM) 90 MG tablet  COPD exacerbation - Plan: diltiazem (CARDIZEM) 90 MG tablet, amoxicillin (AMOXIL) 875  MG tablet, methylPREDNIsolone (MEDROL DOSPACK) 4 MG tablet  Coronary artery disease involving coronary bypass graft of native heart with unspecified angina pectoris - Plan: ezetimibe (ZETIA) 10 MG tablet  Gastroesophageal reflux disease without esophagitis - Plan: esomeprazole (NEXIUM) 40 MG capsule   J  FNP 

## 2013-11-03 NOTE — Addendum Note (Signed)
Addended by: Pollyann Kennedy F on: 11/03/2013 11:18 AM   Modules accepted: Orders

## 2013-11-03 NOTE — Telephone Encounter (Signed)
[name] would like to see if Dr. Laural Golden would take over his pain medication. His PCP is going to stop prescribing pain medicines. He can not afford to go to a pain management doctor. His return phone number is [phone].

## 2013-11-04 ENCOUNTER — Other Ambulatory Visit: Payer: Self-pay | Admitting: *Deleted

## 2013-11-04 LAB — CMP14+EGFR
ALT: 34 IU/L (ref 0–44)
AST: 63 IU/L — ABNORMAL HIGH (ref 0–40)
Albumin/Globulin Ratio: 1.7 (ref 1.1–2.5)
Albumin: 4.4 g/dL (ref 3.5–5.5)
Alkaline Phosphatase: 62 IU/L (ref 39–117)
BUN/Creatinine Ratio: 21 — ABNORMAL HIGH (ref 9–20)
BUN: 20 mg/dL (ref 6–24)
CO2: 25 mmol/L (ref 18–29)
Calcium: 9 mg/dL (ref 8.7–10.2)
Chloride: 95 mmol/L — ABNORMAL LOW (ref 97–108)
Creatinine, Ser: 0.94 mg/dL (ref 0.76–1.27)
GFR calc Af Amer: 109 mL/min/{1.73_m2} (ref >59)
GFR calc non Af Amer: 94 mL/min/{1.73_m2} (ref >59)
Globulin, Total: 2.6 g/dL (ref 1.5–4.5)
Glucose: 121 mg/dL — ABNORMAL HIGH (ref 65–99)
Potassium: 3.2 mmol/L — ABNORMAL LOW (ref 3.5–5.2)
Sodium: 139 mmol/L (ref 134–144)
Total Bilirubin: 0.7 mg/dL (ref 0.0–1.2)
Total Protein: 7 g/dL (ref 6.0–8.5)

## 2013-11-04 LAB — CBC WITH DIFFERENTIAL
Basophils Absolute: 0 10*3/uL (ref 0.0–0.2)
Basos: 1 %
Eos: 3 %
Eosinophils Absolute: 0.1 10*3/uL (ref 0.0–0.4)
HCT: 38 % (ref 37.5–51.0)
Hemoglobin: 12.8 g/dL (ref 12.6–17.7)
Immature Grans (Abs): 0 10*3/uL (ref 0.0–0.1)
Immature Granulocytes: 0 %
Lymphocytes Absolute: 1 10*3/uL (ref 0.7–3.1)
Lymphs: 32 %
MCH: 29 pg (ref 26.6–33.0)
MCHC: 33.7 g/dL (ref 31.5–35.7)
MCV: 86 fL (ref 79–97)
Monocytes Absolute: 0.3 10*3/uL (ref 0.1–0.9)
Monocytes: 9 %
Neutrophils Absolute: 1.7 10*3/uL (ref 1.4–7.0)
Neutrophils Relative %: 55 %
RBC: 4.42 x10E6/uL (ref 4.14–5.80)
RDW: 14.6 % (ref 12.3–15.4)
WBC: 3 10*3/uL — ABNORMAL LOW (ref 3.4–10.8)

## 2013-11-04 LAB — LIPID PANEL
Chol/HDL Ratio: 4.4 ratio units (ref 0.0–5.0)
Cholesterol, Total: 141 mg/dL (ref 100–199)
HDL: 32 mg/dL — ABNORMAL LOW (ref >39)
LDL Calculated: 76 mg/dL (ref 0–99)
Triglycerides: 165 mg/dL — ABNORMAL HIGH (ref 0–149)
VLDL Cholesterol Cal: 33 mg/dL (ref 5–40)

## 2013-11-04 MED ORDER — HYDROCORTISONE 1 % EX OINT: 1.0000 "application " | TOPICAL_OINTMENT | Freq: Three times a day (TID) | CUTANEOUS | Status: DC | PRN

## 2013-11-04 NOTE — Telephone Encounter (Signed)
I will ask Dr.Rehman.

## 2013-11-04 NOTE — Telephone Encounter (Signed)
Per Dr.Rehman we cannot do this , this for PCP to do. Patient called and made aware.

## 2013-11-04 NOTE — Telephone Encounter (Signed)
Pt will ask pharmacy to send Rx.

## 2013-11-05 ENCOUNTER — Telehealth: Payer: Self-pay

## 2013-11-05 NOTE — Telephone Encounter (Signed)
Wants a referral to Dr Lyla Son [facility]

## 2013-11-06 ENCOUNTER — Other Ambulatory Visit: Payer: Self-pay | Admitting: Family Medicine

## 2013-11-06 ENCOUNTER — Other Ambulatory Visit: Payer: Self-pay | Admitting: *Deleted

## 2013-11-06 DIAGNOSIS — D696 Thrombocytopenia, unspecified: Secondary | ICD-10-CM

## 2013-11-06 DIAGNOSIS — R748 Abnormal levels of other serum enzymes: Secondary | ICD-10-CM

## 2013-11-06 DIAGNOSIS — G894 Chronic pain syndrome: Secondary | ICD-10-CM

## 2013-11-06 DIAGNOSIS — R160 Hepatomegaly, not elsewhere classified: Secondary | ICD-10-CM

## 2013-11-06 DIAGNOSIS — R161 Splenomegaly, not elsewhere classified: Secondary | ICD-10-CM

## 2013-11-06 DIAGNOSIS — D72819 Decreased white blood cell count, unspecified: Secondary | ICD-10-CM

## 2013-11-06 DIAGNOSIS — E785 Hyperlipidemia, unspecified: Secondary | ICD-10-CM

## 2013-11-06 DIAGNOSIS — R162 Hepatomegaly with splenomegaly, not elsewhere classified: Secondary | ICD-10-CM

## 2013-11-06 MED ORDER — IPRATROPIUM BROMIDE 0.02 % IN SOLN: 500.0000 ug | Freq: Four times a day (QID) | RESPIRATORY_TRACT | Status: DC | PRN

## 2013-11-10 ENCOUNTER — Other Ambulatory Visit: Payer: Self-pay | Admitting: Family Medicine

## 2013-11-10 ENCOUNTER — Telehealth: Payer: Self-pay | Admitting: Family Medicine

## 2013-11-10 NOTE — Telephone Encounter (Signed)
-----   Message from Lysbeth Penner, FNP sent at 11/09/2013  8:16 AM EST ----- Labs look good

## 2013-11-12 NOTE — Telephone Encounter (Signed)
Patient last seen in office on 11-03-13. Rx last filled on 10-13-13 for #30. Please advise. If approved please route to pool A so nurse can call in to pharmacy

## 2013-11-13 NOTE — Telephone Encounter (Signed)
Pt has 1 refill [facility]

## 2013-11-17 NOTE — Addendum Note (Signed)
Addended by: Pollyann Kennedy F on: 11/17/2013 03:34 PM   Modules accepted: Orders

## 2013-11-18 ENCOUNTER — Telehealth: Payer: Self-pay | Admitting: Family Medicine

## 2013-11-19 ENCOUNTER — Other Ambulatory Visit: Payer: Self-pay | Admitting: *Deleted

## 2013-11-19 ENCOUNTER — Other Ambulatory Visit: Payer: Self-pay | Admitting: Family Medicine

## 2013-11-19 DIAGNOSIS — M545 Low back pain, unspecified: Secondary | ICD-10-CM

## 2013-11-19 MED ORDER — HYDROCODONE-IBUPROFEN 7.5-200 MG PO TABS: 1.0000 | ORAL_TABLET | ORAL | Status: DC | PRN

## 2013-11-19 NOTE — Telephone Encounter (Signed)
Please review and advise.

## 2013-11-19 NOTE — Telephone Encounter (Signed)
Please let patient know his hydrocodone script is ready here at office. He does not answer his phone.

## 2013-11-24 ENCOUNTER — Other Ambulatory Visit: Payer: Self-pay | Admitting: Family Medicine

## 2013-11-25 ENCOUNTER — Other Ambulatory Visit: Payer: Self-pay

## 2013-11-25 MED ORDER — BETAMETHASONE DIPROPIONATE 0.05 % EX CREA: TOPICAL_CREAM | Freq: Two times a day (BID) | CUTANEOUS | Status: DC

## 2013-11-25 MED ORDER — LIDOCAINE 5 % EX OINT: 1.0000 "application " | TOPICAL_OINTMENT | Freq: Four times a day (QID) | CUTANEOUS | Status: DC | PRN

## 2013-12-07 ENCOUNTER — Other Ambulatory Visit: Payer: Self-pay | Admitting: Family Medicine

## 2013-12-08 ENCOUNTER — Telehealth: Payer: Self-pay | Admitting: Family Medicine

## 2013-12-08 NOTE — Telephone Encounter (Signed)
LM,  A decrease in medication dose was intended.  Script can be habit forming.  Patient c/o of knee brace he received and wore for a short time. It caused a sore on his leg. He is sending it back and it is suppose to credit back to his insurance.   He will check with his new insurance which goes into effect today to see if they will cover a new leg brace.  He will get the next leg brace from a different company.

## 2013-12-10 ENCOUNTER — Telehealth: Payer: Self-pay | Admitting: Family Medicine

## 2013-12-10 NOTE — Telephone Encounter (Signed)
Advised pt to go to urgent care now and verbalized understanding. Pt states not SOB, has cough, chest congestion. Will got to urgent care now.

## 2013-12-15 ENCOUNTER — Other Ambulatory Visit: Payer: Self-pay

## 2013-12-15 ENCOUNTER — Other Ambulatory Visit: Payer: Self-pay | Admitting: Family Medicine

## 2013-12-15 DIAGNOSIS — R162 Hepatomegaly with splenomegaly, not elsewhere classified: Secondary | ICD-10-CM

## 2013-12-15 DIAGNOSIS — D72819 Decreased white blood cell count, unspecified: Secondary | ICD-10-CM

## 2013-12-15 DIAGNOSIS — I25709 Atherosclerosis of coronary artery bypass graft(s), unspecified, with unspecified angina pectoris: Secondary | ICD-10-CM

## 2013-12-15 DIAGNOSIS — R748 Abnormal levels of other serum enzymes: Secondary | ICD-10-CM

## 2013-12-15 DIAGNOSIS — R161 Splenomegaly, not elsewhere classified: Secondary | ICD-10-CM

## 2013-12-15 DIAGNOSIS — D696 Thrombocytopenia, unspecified: Secondary | ICD-10-CM

## 2013-12-15 DIAGNOSIS — R160 Hepatomegaly, not elsewhere classified: Secondary | ICD-10-CM

## 2013-12-15 DIAGNOSIS — E785 Hyperlipidemia, unspecified: Secondary | ICD-10-CM

## 2013-12-15 MED ORDER — DOXEPIN HCL 10 MG PO CAPS: 10.0000 mg | ORAL_CAPSULE | Freq: Every day | ORAL | Status: DC

## 2013-12-15 MED ORDER — METOLAZONE 2.5 MG PO TABS: ORAL_TABLET | ORAL | Status: DC

## 2013-12-15 MED ORDER — IPRATROPIUM BROMIDE 0.02 % IN SOLN: 500.0000 ug | Freq: Four times a day (QID) | RESPIRATORY_TRACT | Status: DC | PRN

## 2013-12-15 MED ORDER — POTASSIUM CHLORIDE ER 10 MEQ PO CPCR: 20.0000 meq | ORAL_CAPSULE | Freq: Every day | ORAL | Status: DC

## 2013-12-22 ENCOUNTER — Other Ambulatory Visit: Payer: Self-pay | Admitting: Family Medicine

## 2013-12-22 ENCOUNTER — Telehealth: Payer: Self-pay | Admitting: Family Medicine

## 2013-12-22 DIAGNOSIS — M545 Low back pain, unspecified: Secondary | ICD-10-CM

## 2013-12-22 MED ORDER — HYDROCODONE-IBUPROFEN 7.5-200 MG PO TABS: 1.0000 | ORAL_TABLET | ORAL | Status: DC | PRN

## 2013-12-25 ENCOUNTER — Other Ambulatory Visit: Payer: Self-pay | Admitting: Family Medicine

## 2013-12-25 NOTE — Telephone Encounter (Signed)
Last filled 11/24/13. Nurse must call in to [facility]. Route to pool

## 2014-01-06 ENCOUNTER — Encounter (INDEPENDENT_AMBULATORY_CARE_PROVIDER_SITE_OTHER): Payer: Self-pay

## 2014-01-06 ENCOUNTER — Other Ambulatory Visit: Payer: Self-pay | Admitting: Family Medicine

## 2014-01-12 ENCOUNTER — Other Ambulatory Visit: Payer: Self-pay | Admitting: Family Medicine

## 2014-01-12 NOTE — Telephone Encounter (Signed)
Pt notified too early for pain meds Needs refill on Xanax  Please advise

## 2014-01-22 ENCOUNTER — Encounter: Payer: Self-pay | Admitting: Family Medicine

## 2014-01-22 ENCOUNTER — Other Ambulatory Visit: Payer: Self-pay | Admitting: Family Medicine

## 2014-01-22 ENCOUNTER — Other Ambulatory Visit: Payer: Self-pay | Admitting: Nurse Practitioner

## 2014-01-22 NOTE — Telephone Encounter (Signed)
He is supposed to be seeing Pain Management.  He had his pain meds last filled on 12/18 so it is too soon

## 2014-01-22 NOTE — Telephone Encounter (Signed)
Pt.notified

## 2014-01-22 NOTE — Telephone Encounter (Signed)
Pain med filled 12/22/13, xanax filled 12/25/13. Call [facility]ax into [facility] and pain med will print. Last seen 11/03/13

## 2014-01-25 ENCOUNTER — Ambulatory Visit (INDEPENDENT_AMBULATORY_CARE_PROVIDER_SITE_OTHER): Payer: Medicare Other | Admitting: Internal Medicine

## 2014-02-03 ENCOUNTER — Ambulatory Visit (INDEPENDENT_AMBULATORY_CARE_PROVIDER_SITE_OTHER): Payer: Commercial Managed Care - HMO | Admitting: Cardiology

## 2014-02-03 ENCOUNTER — Encounter: Payer: Self-pay | Admitting: Cardiology

## 2014-02-03 VITALS — BP 103/69 | HR 83 | Ht 72.0 in | Wt 398.0 lb

## 2014-02-03 DIAGNOSIS — I481 Persistent atrial fibrillation: Secondary | ICD-10-CM

## 2014-02-03 DIAGNOSIS — I4819 Other persistent atrial fibrillation: Secondary | ICD-10-CM

## 2014-02-03 MED ORDER — DILTIAZEM HCL ER COATED BEADS 300 MG PO CP24: 300.0000 mg | ORAL_CAPSULE | Freq: Every day | ORAL | Status: DC

## 2014-02-03 NOTE — Progress Notes (Signed)
HPI The patient returns for follow up of diastolic heart failure and a history of atrial fibrillation. Since I last saw him he says that he has been at [facility] a few times with atrial fib.  I have none of these records.  He thinks that his palpitations are happening more.  He says he is having trouble getting his Xanax and pain meds for his back and this worsens his palpitations.   He says that they might last for a few hours but he does not have new symptoms such as chest discomfort, neck or arm discomfort. There has been no new shortness of breath, PND or orthopnea. There has been no presyncope or syncope.   Allergies  Allergen Reactions  . Oxycodone Base Itching and Nausea And Vomiting  . Prozac [Fluoxetine Hcl] Itching    Current Outpatient Prescriptions  Medication Sig Dispense Refill  . allopurinol (ZYLOPRIM) 300 MG tablet Take 300 mg by mouth as needed (as needed for gout).     . ALPRAZolam (XANAX) 0.5 MG tablet TAKE 1 TABLET TWICE DAILY AS NEEDED FOR ANXIETY 30 tablet 3  . betamethasone dipropionate (DIPROLENE) 0.05 % cream APPLY TO AFFECTED AREA TWICE A DAY 45 g 3  . COLCRYS 0.6 MG tablet TAKE ONE TABLET BY MOUTH DAILY AS NEEDED 30 tablet 2  . diltiazem (CARDIZEM) 90 MG tablet Take 1 tablet (90 mg total) by mouth 3 (three) times daily. 90 tablet 5  . doxepin (SINEQUAN) 10 MG capsule Take 1 capsule (10 mg total) by mouth at bedtime. 30 capsule 1  . esomeprazole (NEXIUM) 40 MG capsule Take 1 capsule (40 mg total) by mouth daily. 30 capsule 5  . ezetimibe (ZETIA) 10 MG tablet Take 1 tablet (10 mg total) by mouth daily. 30 tablet 5  . fluticasone (FLONASE) 50 MCG/ACT nasal spray Place 2 sprays into both nostrils daily as needed. For allergies 16 g 1  . folic acid (FOLVITE) 761 MCG tablet Take 400 mcg by mouth daily.    . hydrocortisone 1 % ointment Apply 1 application topically 3 (three) times daily as needed. 30 g 1  . indomethacin (INDOCIN) 50 MG capsule Take 50 mg by mouth as  needed (as needed for gout).     Marland Kitchen ipratropium (ATROVENT) 0.02 % nebulizer solution Take 2.5 mLs (500 mcg total) by nebulization 4 (four) times daily as needed. For wheezing 75 mL 3  . lactulose (CHRONULAC) 10 GM/15ML solution Take 15 mLs (10 g total) by mouth 2 (two) times daily. 960 mL 5  . lidocaine (XYLOCAINE) 5 % ointment Apply 1 application topically 4 (four) times daily as needed. To knees and ankle as needed for pain 35.44 g 2  . LYRICA 300 MG capsule TAKE ONE CAPSULE BY MOUTH TWICE A DAY 60 capsule 3  . Magnesium 400 MG CAPS Take 400 mg by mouth 2 (two) times daily. 30 capsule 0  . metolazone (ZAROXOLYN) 2.5 MG tablet TAKE 1 TABLET 1/2 HOUR PRIOR TO TORSEMIDE FOR 3 DAYS AS NEEDED FOR SEVERE SWELLING 30 tablet 1  . milk thistle 175 MG tablet Take 175 mg by mouth 4 (four) times daily.     . Multiple Vitamin (MULITIVITAMIN WITH MINERALS) TABS Take 1 tablet by mouth daily.    Marland Kitchen neomycin-bacitracin-polymyxin (NEOSPORIN) ointment Apply 1 application topically every 12 (twelve) hours as needed. For cut on ankle    . nitroGLYCERIN (NITROSTAT) 0.4 MG SL tablet Place 1 tablet (0.4 mg total) under the tongue every 5 (five) minutes  as needed for chest pain (may repeat x3). 10 tablet 2  . phenylephrine (NASAL DECONGESTANT) 1 % nasal spray Place 1 drop into the nose every 4 (four) hours as needed. For allergies    . potassium chloride (MICRO-K) 10 MEQ CR capsule Take 2 capsules (20 mEq total) by mouth daily. 30 capsule 3  . PROAIR HFA 108 (90 BASE) MCG/ACT inhaler INHALE 2 PUFFS THREE TIMES A DAY AS DIRECTED 8.5 g 4  . torsemide (DEMADEX) 20 MG tablet Take 20 mg by mouth 2 (two) times daily. Take two tablets in the morning and one at lunchtime    . traZODone (DESYREL) 50 MG tablet Take 1 tablet (50 mg total) by mouth at bedtime. 90 tablet 2  . vitamin B-12 (CYANOCOBALAMIN) 100 MCG tablet Take 0.5 tablets (50 mcg total) by mouth daily. 30 tablet 11  . Menthol-Methyl Salicylate (MUSCLE RUB) 10-15 % CREA  Apply 1 application topically as needed. For muscle pain    . ONE TOUCH ULTRA TEST test strip      No current facility-administered medications for this visit.    Past Medical History  Diagnosis Date  . Gout   . Diabetes mellitus     type 2  . RA (rheumatoid arthritis)   . COPD (chronic obstructive pulmonary disease)     with cpap  . Atrial fibrillation   . Cirrhosis   . Anxiety   . Heart murmur   . CHF (congestive heart failure)   . Constipation   . Sleep apnea     cpap used- level 10 and greater  . Neuromuscular disorder     neuropathy in hands and feet  . Cirrhosis     NASH  . Thrombocytopenia   . Genu varus     Past Surgical History  Procedure Laterality Date  . Sciatic nerve exploration    . Tonsillectomy    . Esophagogastroduodenoscopy  02/28/2011    Procedure: ESOPHAGOGASTRODUODENOSCOPY (EGD);  Surgeon: Rogene Houston, MD;  [facility]: [facility];  Service: Endoscopy;  Laterality: N/A;  1200  . Colonoscopy  05/08/2011    Procedure: COLONOSCOPY;  Surgeon: Rogene Houston, MD;  Location: [facility];  Service: Endoscopy;  Laterality: N/A;  730  . Hernia repair      as child  . Liver biopsy  2012  . Ankle surgery      right ankle talor repair  . Esophagogastroduodenoscopy N/A 06/11/2012    Procedure: ESOPHAGOGASTRODUODENOSCOPY (EGD);  Surgeon: Rogene Houston, MD;  Location: [facility];  Service: Endoscopy;  Laterality: N/A;  1200  FYI patient is 400 pounds    ROS:  Back pain.  Otherwise as stated in the HPI and negative for all other systems.  PHYSICAL EXAM BP 103/69 mmHg  Pulse 83  Ht 6' (1.829 m)  Wt 398 lb (180.532 kg)  BMI 53.97 kg/m2 GENERAL:  Well appearing NECK:  No jugular venous distention, waveform within normal limits, carotid upstroke brisk and symmetric, no bruits, no thyromegaly LUNGS:  Clear to auscultation bilaterally BACK:  No CVA tenderness CHEST:  Unremarkable HEART:  PMI not displaced or sustained,S1 and S2 within normal limits,  no S3, no S4, no clicks, no rubs, no murmurs ABD:  Flat, positive bowel sounds normal in frequency in pitch, no bruits, no rebound, no guarding, no midline pulsatile mass, no hepatomegaly, no splenomegaly, morbidly obese EXT:  2 plus pulses throughout, trace edema, no cyanosis no clubbing SKIN:  Psoriasis   EKG:  Sinus  rhythm, rate 83, axis within normal limits, intervals within normal limits, no acute ST-T wave changes. 02/03/2014   ASSESSMENT AND PLAN   ATRIAL FIBRILLATION - I am going to increase his Cardizem to 300 mg daily to see if this helps with his palpitations. He is low thromboembolic risk actually. Because of this and difficulty affording compliant with medications he is not on anticoagulation.  OBESITY, MORBID -  We have discussed this at multiple appointments.  I am very proud of him because he is down about 30 pounds from his peak.  DIASTOLIC HEART FAILURE, CHRONIC - He seems to be euvolemic. His EF was 65% on his echo last year. No further evaluation or change in therapy is indicated.

## 2014-02-03 NOTE — Patient Instructions (Signed)
Please start Cardizem 300 mg once a day. Continue all other medications as listed.  Follow up in 3 months with Dr Percival Spanish in [place].  Thank you for choosing [facility]!!

## 2014-02-09 ENCOUNTER — Telehealth (INDEPENDENT_AMBULATORY_CARE_PROVIDER_SITE_OTHER): Payer: Self-pay | Admitting: *Deleted

## 2014-02-09 NOTE — Telephone Encounter (Signed)
[name] said Dr. Laurance Flatten is not going to refill his pain medicine. He would like to know if there is anything OTC that could help him that will not affect his liver. [name]'s return phone number is [phone]. He is also asking Tammy, the nurse, or Dr. Laural Golden to please give him a call back.

## 2014-02-10 NOTE — Telephone Encounter (Signed)
This was addressed with Dr.Rehman on 02/09/14. No response at this time.

## 2014-02-17 ENCOUNTER — Ambulatory Visit: Payer: Self-pay | Admitting: Cardiology

## 2014-02-18 ENCOUNTER — Other Ambulatory Visit: Payer: Self-pay | Admitting: Anesthesiology

## 2014-02-18 DIAGNOSIS — M47816 Spondylosis without myelopathy or radiculopathy, lumbar region: Secondary | ICD-10-CM

## 2014-03-09 ENCOUNTER — Ambulatory Visit (INDEPENDENT_AMBULATORY_CARE_PROVIDER_SITE_OTHER): Payer: Commercial Managed Care - HMO | Admitting: Internal Medicine

## 2014-03-09 ENCOUNTER — Other Ambulatory Visit (INDEPENDENT_AMBULATORY_CARE_PROVIDER_SITE_OTHER): Payer: Self-pay | Admitting: *Deleted

## 2014-03-09 ENCOUNTER — Encounter (INDEPENDENT_AMBULATORY_CARE_PROVIDER_SITE_OTHER): Payer: Self-pay | Admitting: *Deleted

## 2014-03-09 ENCOUNTER — Encounter (INDEPENDENT_AMBULATORY_CARE_PROVIDER_SITE_OTHER): Payer: Self-pay | Admitting: Internal Medicine

## 2014-03-09 VITALS — BP 106/70 | HR 80 | Temp 97.4°F | Resp 18 | Ht 72.0 in | Wt 395.6 lb

## 2014-03-09 DIAGNOSIS — K7290 Hepatic failure, unspecified without coma: Secondary | ICD-10-CM

## 2014-03-09 DIAGNOSIS — D696 Thrombocytopenia, unspecified: Secondary | ICD-10-CM

## 2014-03-09 DIAGNOSIS — K7469 Other cirrhosis of liver: Secondary | ICD-10-CM

## 2014-03-09 DIAGNOSIS — K7682 Hepatic encephalopathy: Secondary | ICD-10-CM

## 2014-03-09 DIAGNOSIS — K7030 Alcoholic cirrhosis of liver without ascites: Secondary | ICD-10-CM

## 2014-03-09 LAB — AMMONIA: Ammonia: 70 umol/L — ABNORMAL HIGH (ref 16–53)

## 2014-03-09 MED ORDER — LACTULOSE 10 GM/15ML PO SOLN: 20.0000 g | Freq: Two times a day (BID) | ORAL | Status: DC

## 2014-03-09 NOTE — Patient Instructions (Signed)
Physician will call with results of blood test and ultrasound when completed. Esophagogastroduodenoscopy to be scheduled

## 2014-03-09 NOTE — Progress Notes (Signed)
Presenting complaint;  Follow-up for cirrhosis.  Subjective:  Patient is 49 year old Caucasian male who is cirrhosis secondary to alcohol and possibly NAFLD who presents for scheduled visit. He was last seen on 07/22/2013. He has lost 24 pounds since his last visit. He states he's watching calorie intake. He rarely drinks colas or carbonated drinks. He stays away from fatty and right foods. He is also eating less amount at each meal. He states he's had 6 drinks of alcohol in the last 18 months. He denies abdominal pain melena or rectal bleeding. He has chronic low back pain as well as pain in lower extremities and knees. He was begun on muscle relaxer by Dr. Francesco Runner. He states he was briefly hospitalized at [facility] for atrial fibrillation. He states lactulose is not working. He remembers his ammonia to be elevated in the past.   Current Medications: Outpatient Encounter Prescriptions as of 03/09/2014  Medication Sig  . allopurinol (ZYLOPRIM) 300 MG tablet Take 300 mg by mouth as needed (as needed for gout).   . ALPRAZolam (XANAX) 0.5 MG tablet TAKE 1 TABLET TWICE DAILY AS NEEDED FOR ANXIETY  . baclofen (LIORESAL) 10 MG tablet Take 10 mg by mouth 3 (three) times daily.  . betamethasone dipropionate (DIPROLENE) 0.05 % cream APPLY TO AFFECTED AREA TWICE A DAY  . COLCRYS 0.6 MG tablet TAKE ONE TABLET BY MOUTH DAILY AS NEEDED  . diltiazem (CARDIZEM CD) 300 MG 24 hr capsule Take 1 capsule (300 mg total) by mouth daily.  Marland Kitchen doxepin (SINEQUAN) 10 MG capsule Take 1 capsule (10 mg total) by mouth at bedtime.  Marland Kitchen esomeprazole (NEXIUM) 40 MG capsule Take 1 capsule (40 mg total) by mouth daily.  Marland Kitchen ezetimibe (ZETIA) 10 MG tablet Take 1 tablet (10 mg total) by mouth daily.  . fluticasone (FLONASE) 50 MCG/ACT nasal spray Place 2 sprays into both nostrils daily as needed. For allergies  . folic acid (FOLVITE) 767 MCG tablet Take 400 mcg by mouth daily.  . hydrocortisone 1 % ointment Apply 1 application topically 3  (three) times daily as needed.  Marland Kitchen ipratropium (ATROVENT) 0.02 % nebulizer solution Take 2.5 mLs (500 mcg total) by nebulization 4 (four) times daily as needed. For wheezing  . lactulose (CHRONULAC) 10 GM/15ML solution Take 15 mLs (10 g total) by mouth 2 (two) times daily.  Marland Kitchen lidocaine (XYLOCAINE) 5 % ointment Apply 1 application topically 4 (four) times daily as needed. To knees and ankle as needed for pain  . Magnesium 400 MG CAPS Take 400 mg by mouth 2 (two) times daily.  . Menthol-Methyl Salicylate (MUSCLE RUB) 10-15 % CREA Apply 1 application topically as needed. For muscle pain  . metolazone (ZAROXOLYN) 2.5 MG tablet TAKE 1 TABLET 1/2 HOUR PRIOR TO TORSEMIDE FOR 3 DAYS AS NEEDED FOR SEVERE SWELLING  . milk thistle 175 MG tablet Take 175 mg by mouth 4 (four) times daily.   . MORPHINE SULFATE ER PO Take 15 mg by mouth every 6 (six) hours.  . Multiple Vitamin (MULITIVITAMIN WITH MINERALS) TABS Take 1 tablet by mouth daily.  . Multiple Vitamins-Minerals (MULTI COMPLETE PO) Take by mouth daily.  Marland Kitchen neomycin-bacitracin-polymyxin (NEOSPORIN) ointment Apply 1 application topically every 12 (twelve) hours as needed. For cut on ankle  . nitroGLYCERIN (NITROSTAT) 0.4 MG SL tablet Place 1 tablet (0.4 mg total) under the tongue every 5 (five) minutes as needed for chest pain (may repeat x3).  . Omega-3 Fatty Acids (FISH OIL) 1000 MG CAPS Take by mouth daily.  Marland Kitchen  ONE TOUCH ULTRA TEST test strip   . phenylephrine (NASAL DECONGESTANT) 1 % nasal spray Place 1 drop into the nose every 4 (four) hours as needed. For allergies  . potassium chloride (MICRO-K) 10 MEQ CR capsule Take 2 capsules (20 mEq total) by mouth daily.  Marland Kitchen PROAIR HFA 108 (90 BASE) MCG/ACT inhaler INHALE 2 PUFFS THREE TIMES A DAY AS DIRECTED  . torsemide (DEMADEX) 20 MG tablet Take 20 mg by mouth 3 (three) times daily.  . [DISCONTINUED] diltiazem (CARDIZEM) 90 MG tablet Take 1 tablet (90 mg total) by mouth 3 (three) times daily. (Patient not  taking: Reported on 03/09/2014)  . [DISCONTINUED] indomethacin (INDOCIN) 50 MG capsule Take 50 mg by mouth as needed (as needed for gout).   . [DISCONTINUED] LYRICA 300 MG capsule TAKE ONE CAPSULE BY MOUTH TWICE A DAY (Patient not taking: Reported on 03/09/2014)  . [DISCONTINUED] torsemide (DEMADEX) 20 MG tablet Take 20 mg by mouth 2 (two) times daily. Take two tablets in the morning and one at lunchtime  . [DISCONTINUED] traZODone (DESYREL) 50 MG tablet Take 1 tablet (50 mg total) by mouth at bedtime. (Patient not taking: Reported on 03/09/2014)  . [DISCONTINUED] vitamin B-12 (CYANOCOBALAMIN) 100 MCG tablet Take 0.5 tablets (50 mcg total) by mouth daily. (Patient not taking: Reported on 03/09/2014)     Objective: Blood pressure 106/70, pulse 80, temperature 97.4 F (36.3 C), temperature source Oral, resp. rate 18, height 6' (1.829 m), weight 395 lb 9.6 oz (179.443 kg). Patient is alert and in no acute distress. Asterixis absent. Conjunctiva is pink. Sclera is nonicteric Oropharyngeal mucosa is normal. No neck masses or thyromegaly noted. Cardiac exam with regular rhythm normal S1 and S2. No murmur or gallop noted. Lungs are clear to auscultation. Abdomen is obese with umbilical hernia which is soft and partially reducible. Skin is somewhat discolored. No ulceration noted. Abdomen is soft without tenderness. Difficult to palpate liver or spleen. No LE edema or clubbing noted.   Labs/studies Results: Lab data from 01/24/2014([facility]).  W BC 3.2, H&H 13.3 and 41.1 and platelet count 58K  UN 13, creatinine 1.12, serum sodium 140, potassium 2.9, chloride 105, CO2 24 and glucose 363.    AFP was 9.2 on 07/22/2013.  Ultrasound and 07/26/2013 revealed cirrhotic appearing liver and splenomegaly  Last EGD was in 06/11/2012 revealing portal gastropathy and grade 1 esophageal varices.  Unable to find serum ammonia report.  Assessment:  #1. Cirrhosis secondary to alcohol abuse. He must also have an  element of NASH given morbid obesity. He has biopsy-proven stage IV disease. He is due for [facility] screening and elevation of upper GI tract to diagnose and treat large esophageal varices. History of elevated serum ammonia but he does not appear to be encephalopathic. He is not driving since he does not have a license. #2. Thrombocytopenia secondary to cirrhosis and splenomegaly. #3. Morbid obesity. He has lost 24 pounds since his last visit. He must continue with his affect had weight loss in order to improve his prognosis. #4. History of elevated serum ammonia. ? Subclinical hepatic encephalopathy.    Plan:  Esophagogastroduodenoscopy under monitored anesthesia care to assess for esophageal varices. Esophageal banding would be considered if ;arge varices lnoted. Patient will go to lab for serum ammonia, AFP and LFTs. Patient encouraged to increase physical activity as much as tolerated and continue to watch calorie intake in order to lose more weight. Office visit in 6 months.

## 2014-03-10 LAB — HEPATIC FUNCTION PANEL
ALK PHOS: 79 U/L (ref 39–117)
ALT: 41 U/L (ref 0–53)
AST: 52 U/L — ABNORMAL HIGH (ref 0–37)
Albumin: 4.5 g/dL (ref 3.5–5.2)
BILIRUBIN DIRECT: 0.3 mg/dL (ref 0.0–0.3)
Indirect Bilirubin: 0.7 mg/dL (ref 0.2–1.2)
TOTAL PROTEIN: 7.6 g/dL (ref 6.0–8.3)
Total Bilirubin: 1 mg/dL (ref 0.2–1.2)

## 2014-03-10 LAB — AFP TUMOR MARKER: AFP-Tumor Marker: 6 ng/mL (ref <6.1)

## 2014-03-12 ENCOUNTER — Other Ambulatory Visit (HOSPITAL_COMMUNITY): Payer: Medicare HMO

## 2014-03-12 ENCOUNTER — Ambulatory Visit
Admission: RE | Admit: 2014-03-12 | Discharge: 2014-03-12 | Disposition: A | Discharge status: Home / Self Care | Payer: Commercial Managed Care - HMO | Source: Ambulatory Visit | Attending: Anesthesiology | Admitting: Anesthesiology

## 2014-03-12 DIAGNOSIS — M47816 Spondylosis without myelopathy or radiculopathy, lumbar region: Secondary | ICD-10-CM

## 2014-03-25 ENCOUNTER — Telehealth (INDEPENDENT_AMBULATORY_CARE_PROVIDER_SITE_OTHER): Payer: Self-pay | Admitting: *Deleted

## 2014-03-25 NOTE — Telephone Encounter (Signed)
Would like to know if Gabatentin was one of the medications Karna Christmas took him off before? His return phone number is [phone].

## 2014-03-26 NOTE — Telephone Encounter (Signed)
This was addressed

## 2014-04-14 NOTE — Patient Instructions (Signed)
[name]  04/14/2014   Your procedure is scheduled on:   04/21/2014  Report to [facility] at  700  AM.  Call this number if you have problems the morning of surgery: [phone]   Remember:   Do not eat food or drink liquids after midnight.   Take these medicines the morning of surgery with A SIP OF WATER:  Allopurinol, xanax, diltiazem, nexium, mobic, morphine( if you need it), zanaflex (if needed).   Do not wear jewelry, make-up or nail polish.  Do not wear lotions, powders, or perfumes.  Do not shave 48 hours prior to surgery. Men may shave face and neck.  Do not bring valuables to the hospital.  [facility] is not responsible for any belongings or valuables.               Contacts, dentures or bridgework may not be worn into surgery.  Leave suitcase in the car. After surgery it may be brought to your room.  For patients admitted to the hospital, discharge time is determined by your treatment team.               Patients discharged the day of surgery will not be allowed to drive home.  Name and phone number of your driver: family  Special Instructions: N/A   Please read over the following fact sheets that you were given: Pain Booklet, Coughing and Deep Breathing, Surgical Site Infection Prevention, Anesthesia Post-op Instructions and Care and Recovery After Surgery Esophagogastroduodenoscopy Esophagogastroduodenoscopy (EGD) is a procedure to examine the lining of the esophagus, stomach, and first part of the small intestine (duodenum). A long, flexible, lighted tube with a camera attached (endoscope) is inserted down the throat to view these organs. This procedure is done to detect problems or abnormalities, such as inflammation, bleeding, ulcers, or growths, in order to treat them. The procedure lasts about 5-20 minutes. It is usually an outpatient procedure, but it may need to be performed in emergency cases in the hospital. LET YOUR CAREGIVER KNOW ABOUT:   Allergies  to food or medicine.  All medicines you are taking, including vitamins, herbs, eyedrops, and over-the-counter medicines and creams.  Use of steroids (by mouth or creams).  Previous problems you or members of your family have had with the use of anesthetics.  Any blood disorders you have.  Previous surgeries you have had.  Other health problems you have.  Possibility of pregnancy, if this applies. RISKS AND COMPLICATIONS  Generally, EGD is a safe procedure. However, as with any procedure, complications can occur. Possible complications include:  Infection.  Bleeding.  Tearing (perforation) of the esophagus, stomach, or duodenum.  Difficulty breathing or not being able to breath.  Excessive sweating.  Spasms of the larynx.  Slowed heartbeat.  Low blood pressure. BEFORE THE PROCEDURE  Do not eat or drink anything for 6-8 hours before the procedure or as directed by your caregiver.  Ask your caregiver about changing or stopping your regular medicines.  If you wear dentures, be prepared to remove them before the procedure.  Arrange for someone to drive you home after the procedure. PROCEDURE   A vein will be accessed to give medicines and fluids. A medicine to relax you (sedative) and a pain reliever will be given through that access into the vein.  A numbing medicine (local anesthetic) may be sprayed on your throat for comfort and to stop you from gagging or coughing.  A mouth guard may  be placed in your mouth to protect your teeth and to keep you from biting on the endoscope.  You will be asked to lie on your left side.  The endoscope is inserted down your throat and into the esophagus, stomach, and duodenum.  Air is put through the endoscope to allow your caregiver to view the lining of your esophagus clearly.  The esophagus, stomach, and duodenum is then examined. During the exam, your caregiver may:  Remove tissue to be examined under a microscope (biopsy)  for inflammation, infection, or other medical problems.  Remove growths.  Remove objects (foreign bodies) that are stuck.  Treat any bleeding with medicines or other devices that stop tissues from bleeding (hot cautery, clipping devices).  Widen (dilate) or stretch narrowed areas of the esophagus and stomach.  The endoscope will then be withdrawn. AFTER THE PROCEDURE  You will be taken to a recovery area to be monitored. You will be able to go home once you are stable and alert.  Do not eat or drink anything until the local anesthetic and numbing medicines have worn off. You may choke.  It is normal to feel bloated, have pain with swallowing, or have a sore throat for a short time. This will wear off.  Your caregiver should be able to discuss his or her findings with you. It will take longer to discuss the test results if any biopsies were taken. Document Released: 04/27/2004 Document Revised: 05/11/2013 Document Reviewed: 11/28/2011 [facility] Patient Information 2015 [place], [place]. This information is not intended to replace advice given to you by your health care provider. Make sure you discuss any questions you have with your health care provider. PATIENT INSTRUCTIONS POST-ANESTHESIA  IMMEDIATELY FOLLOWING SURGERY:  Do not drive or operate machinery for the first twenty four hours after surgery.  Do not make any important decisions for twenty four hours after surgery or while taking narcotic pain medications or sedatives.  If you develop intractable nausea and vomiting or a severe headache please notify your doctor immediately.  FOLLOW-UP:  Please make an appointment with your surgeon as instructed. You do not need to follow up with anesthesia unless specifically instructed to do so.  WOUND CARE INSTRUCTIONS (if applicable):  Keep a dry clean dressing on the anesthesia/puncture wound site if there is drainage.  Once the wound has quit draining you may leave it open to air.  Generally  you should leave the bandage intact for twenty four hours unless there is drainage.  If the epidural site drains for more than 36-48 hours please call the anesthesia department.  QUESTIONS?:  Please feel free to call your physician or the hospital operator if you have any questions, and they will be happy to assist you.

## 2014-04-16 ENCOUNTER — Encounter (HOSPITAL_COMMUNITY)
Admission: RE | Admit: 2014-04-16 | Discharge: 2014-04-16 | Disposition: A | Discharge status: Home / Self Care | Payer: Medicare HMO | Source: Ambulatory Visit | Attending: Internal Medicine | Admitting: Internal Medicine

## 2014-04-16 ENCOUNTER — Encounter (HOSPITAL_COMMUNITY): Payer: Self-pay

## 2014-04-16 DIAGNOSIS — Z01818 Encounter for other preprocedural examination: Secondary | ICD-10-CM | POA: Insufficient documentation

## 2014-04-16 HISTORY — DX: Psoriasis, unspecified: L40.9

## 2014-04-16 HISTORY — DX: Gastro-esophageal reflux disease without esophagitis: K21.9

## 2014-04-16 LAB — CBC
HCT: 40.2 % (ref 39.0–52.0)
HEMOGLOBIN: 12.9 g/dL — AB (ref 13.0–17.0)
MCH: 25.7 pg — AB (ref 26.0–34.0)
MCHC: 32.1 g/dL (ref 30.0–36.0)
MCV: 80.2 fL (ref 78.0–100.0)
PLATELETS: 64 10*3/uL — AB (ref 150–400)
RBC: 5.01 MIL/uL (ref 4.22–5.81)
RDW: 15.9 % — ABNORMAL HIGH (ref 11.5–15.5)
WBC: 2.9 10*3/uL — AB (ref 4.0–10.5)

## 2014-04-16 LAB — BASIC METABOLIC PANEL
Anion gap: 10 (ref 5–15)
BUN: 10 mg/dL (ref 6–23)
CO2: 28 mmol/L (ref 19–32)
Calcium: 8.7 mg/dL (ref 8.4–10.5)
Chloride: 101 mmol/L (ref 96–112)
Creatinine, Ser: 0.65 mg/dL (ref 0.50–1.35)
GFR calc Af Amer: >90 mL/min (ref >90)
GLUCOSE: 127 mg/dL — AB (ref 70–99)
Potassium: 3.4 mmol/L — ABNORMAL LOW (ref 3.5–5.1)
Sodium: 139 mmol/L (ref 135–145)

## 2014-04-20 ENCOUNTER — Other Ambulatory Visit: Payer: Self-pay | Admitting: Family Medicine

## 2014-04-21 ENCOUNTER — Other Ambulatory Visit: Payer: Self-pay | Admitting: Family Medicine

## 2014-04-21 ENCOUNTER — Ambulatory Visit (HOSPITAL_COMMUNITY): Payer: Medicare HMO | Admitting: Anesthesiology

## 2014-04-21 ENCOUNTER — Ambulatory Visit (HOSPITAL_COMMUNITY)
Admission: RE | Admit: 2014-04-21 | Discharge: 2014-04-21 | Disposition: A | Discharge status: Home / Self Care | Payer: Medicare HMO | Source: Ambulatory Visit | Attending: Internal Medicine | Admitting: Internal Medicine

## 2014-04-21 ENCOUNTER — Encounter (HOSPITAL_COMMUNITY)
Admission: RE | Disposition: A | Discharge status: Home / Self Care | Payer: Self-pay | Source: Ambulatory Visit | Attending: Internal Medicine

## 2014-04-21 ENCOUNTER — Encounter (HOSPITAL_COMMUNITY): Payer: Self-pay | Admitting: *Deleted

## 2014-04-21 DIAGNOSIS — Z79899 Other long term (current) drug therapy: Secondary | ICD-10-CM | POA: Diagnosis not present

## 2014-04-21 DIAGNOSIS — K3189 Other diseases of stomach and duodenum: Secondary | ICD-10-CM | POA: Diagnosis not present

## 2014-04-21 DIAGNOSIS — K259 Gastric ulcer, unspecified as acute or chronic, without hemorrhage or perforation: Secondary | ICD-10-CM | POA: Diagnosis not present

## 2014-04-21 DIAGNOSIS — I8500 Esophageal varices without bleeding: Secondary | ICD-10-CM | POA: Diagnosis not present

## 2014-04-21 DIAGNOSIS — M109 Gout, unspecified: Secondary | ICD-10-CM | POA: Diagnosis not present

## 2014-04-21 DIAGNOSIS — K219 Gastro-esophageal reflux disease without esophagitis: Secondary | ICD-10-CM | POA: Insufficient documentation

## 2014-04-21 DIAGNOSIS — I509 Heart failure, unspecified: Secondary | ICD-10-CM | POA: Insufficient documentation

## 2014-04-21 DIAGNOSIS — J449 Chronic obstructive pulmonary disease, unspecified: Secondary | ICD-10-CM | POA: Diagnosis not present

## 2014-04-21 DIAGNOSIS — G4730 Sleep apnea, unspecified: Secondary | ICD-10-CM | POA: Diagnosis not present

## 2014-04-21 DIAGNOSIS — Z7982 Long term (current) use of aspirin: Secondary | ICD-10-CM | POA: Diagnosis not present

## 2014-04-21 DIAGNOSIS — F419 Anxiety disorder, unspecified: Secondary | ICD-10-CM | POA: Diagnosis not present

## 2014-04-21 DIAGNOSIS — K766 Portal hypertension: Secondary | ICD-10-CM | POA: Insufficient documentation

## 2014-04-21 DIAGNOSIS — K7469 Other cirrhosis of liver: Secondary | ICD-10-CM

## 2014-04-21 DIAGNOSIS — Z792 Long term (current) use of antibiotics: Secondary | ICD-10-CM | POA: Insufficient documentation

## 2014-04-21 DIAGNOSIS — I4891 Unspecified atrial fibrillation: Secondary | ICD-10-CM | POA: Diagnosis not present

## 2014-04-21 DIAGNOSIS — M069 Rheumatoid arthritis, unspecified: Secondary | ICD-10-CM | POA: Insufficient documentation

## 2014-04-21 DIAGNOSIS — E119 Type 2 diabetes mellitus without complications: Secondary | ICD-10-CM | POA: Insufficient documentation

## 2014-04-21 DIAGNOSIS — J45909 Unspecified asthma, uncomplicated: Secondary | ICD-10-CM | POA: Insufficient documentation

## 2014-04-21 DIAGNOSIS — I2510 Atherosclerotic heart disease of native coronary artery without angina pectoris: Secondary | ICD-10-CM | POA: Insufficient documentation

## 2014-04-21 HISTORY — PX: ESOPHAGOGASTRODUODENOSCOPY (EGD) WITH PROPOFOL: SHX5813

## 2014-04-21 LAB — AMMONIA: Ammonia: 21 umol/L (ref 11–32)

## 2014-04-21 LAB — GLUCOSE, CAPILLARY
GLUCOSE-CAPILLARY: 97 mg/dL (ref 70–99)
Glucose-Capillary: 106 mg/dL — ABNORMAL HIGH (ref 70–99)

## 2014-04-21 SURGERY — ESOPHAGOGASTRODUODENOSCOPY (EGD) WITH PROPOFOL
Anesthesia: Monitor Anesthesia Care | Site: Esophagus

## 2014-04-21 MED ORDER — PROPOFOL INFUSION 10 MG/ML OPTIME
INTRAVENOUS | Status: DC | PRN
  Administered 2014-04-21: 150 ug/kg/min via INTRAVENOUS
  Administered 2014-04-21: 125 ug/kg/min via INTRAVENOUS

## 2014-04-21 MED ORDER — PROPOFOL 10 MG/ML IV BOLUS
INTRAVENOUS | Status: AC
  Filled 2014-04-21: qty 20

## 2014-04-21 MED ORDER — LIDOCAINE HCL (PF) 1 % IJ SOLN
INTRAMUSCULAR | Status: AC
  Filled 2014-04-21: qty 5

## 2014-04-21 MED ORDER — FENTANYL CITRATE 0.05 MG/ML IJ SOLN: 25.0000 ug | INTRAMUSCULAR | Status: DC | PRN

## 2014-04-21 MED ORDER — MIDAZOLAM HCL 2 MG/2ML IJ SOLN
INTRAMUSCULAR | Status: AC
  Filled 2014-04-21: qty 2

## 2014-04-21 MED ORDER — GLYCOPYRROLATE 0.2 MG/ML IJ SOLN
INTRAMUSCULAR | Status: AC
  Filled 2014-04-21: qty 1

## 2014-04-21 MED ORDER — BUTAMBEN-TETRACAINE-BENZOCAINE 2-2-14 % EX AERO
1.0000 | INHALATION_SPRAY | Freq: Once | CUTANEOUS | Status: AC
  Administered 2014-04-21: 1 via TOPICAL
  Filled 2014-04-21: qty 20

## 2014-04-21 MED ORDER — STERILE WATER FOR IRRIGATION IR SOLN
Status: DC | PRN
  Administered 2014-04-21: 1000 mL

## 2014-04-21 MED ORDER — ONDANSETRON HCL 4 MG/2ML IJ SOLN
INTRAMUSCULAR | Status: AC
  Filled 2014-04-21: qty 2

## 2014-04-21 MED ORDER — SUCCINYLCHOLINE CHLORIDE 20 MG/ML IJ SOLN
INTRAMUSCULAR | Status: AC
  Filled 2014-04-21: qty 2

## 2014-04-21 MED ORDER — FENTANYL CITRATE 0.05 MG/ML IJ SOLN
25.0000 ug | INTRAMUSCULAR | Status: AC
  Administered 2014-04-21 (×2): 25 ug via INTRAVENOUS

## 2014-04-21 MED ORDER — LIDOCAINE HCL (CARDIAC) 10 MG/ML IV SOLN
INTRAVENOUS | Status: DC | PRN
  Administered 2014-04-21: 50 mg via INTRAVENOUS

## 2014-04-21 MED ORDER — ONDANSETRON HCL 4 MG/2ML IJ SOLN
4.0000 mg | Freq: Once | INTRAMUSCULAR | Status: AC
  Administered 2014-04-21: 4 mg via INTRAVENOUS

## 2014-04-21 MED ORDER — ONDANSETRON HCL 4 MG/2ML IJ SOLN: 4.0000 mg | Freq: Once | INTRAMUSCULAR | Status: DC | PRN

## 2014-04-21 MED ORDER — MIDAZOLAM HCL 2 MG/2ML IJ SOLN
1.0000 mg | INTRAMUSCULAR | Status: DC | PRN
  Administered 2014-04-21: 2 mg via INTRAVENOUS

## 2014-04-21 MED ORDER — FENTANYL CITRATE 0.05 MG/ML IJ SOLN
INTRAMUSCULAR | Status: AC
  Filled 2014-04-21: qty 2

## 2014-04-21 MED ORDER — GLYCOPYRROLATE 0.2 MG/ML IJ SOLN
0.2000 mg | Freq: Once | INTRAMUSCULAR | Status: AC
  Administered 2014-04-21: 0.2 mg via INTRAVENOUS

## 2014-04-21 MED ORDER — LACTATED RINGERS IV SOLN
INTRAVENOUS | Status: DC
  Administered 2014-04-21: 09:00:00 via INTRAVENOUS

## 2014-04-21 MED ORDER — MIDAZOLAM HCL 5 MG/5ML IJ SOLN
INTRAMUSCULAR | Status: DC | PRN
  Administered 2014-04-21: 2 mg via INTRAVENOUS

## 2014-04-21 SURGICAL SUPPLY — 21 items
BLOCK BITE 60FR ADLT L/F BLUE (MISCELLANEOUS) ×1 IMPLANT
ELECT REM PT RETURN 9FT ADLT (ELECTROSURGICAL)
ELECTRODE REM PT RTRN 9FT ADLT (ELECTROSURGICAL) IMPLANT
FLOOR PAD 36X40 (MISCELLANEOUS) ×2
FORCEP COLD BIOPSY (CUTTING FORCEPS) IMPLANT
FORCEPS BIOP RAD 4 LRG CAP 4 (CUTTING FORCEPS) IMPLANT
FORMALIN 10 PREFIL 20ML (MISCELLANEOUS) IMPLANT
KIT CLEAN ENDO COMPLIANCE (KITS) ×1 IMPLANT
MANIFOLD NEPTUNE II (INSTRUMENTS) ×2 IMPLANT
NDL SCLEROTHERAPY 25GX240 (NEEDLE) IMPLANT
NEEDLE SCLEROTHERAPY 25GX240 (NEEDLE) IMPLANT
PAD FLOOR 36X40 (MISCELLANEOUS) IMPLANT
PROBE APC STR FIRE (PROBE) IMPLANT
PROBE INJECTION GOLD (MISCELLANEOUS)
PROBE INJECTION GOLD 7FR (MISCELLANEOUS) IMPLANT
SNARE ROTATE MED OVAL 20MM (MISCELLANEOUS) IMPLANT
SNARE SHORT THROW 13M SML OVAL (MISCELLANEOUS) ×1 IMPLANT
SYR 50ML LL SCALE MARK (SYRINGE) ×1 IMPLANT
SYR INFLATION 60ML (SYRINGE) IMPLANT
TUBING IRRIGATION ENDOGATOR (MISCELLANEOUS) ×1 IMPLANT
WATER STERILE IRR 1000ML POUR (IV SOLUTION) ×1 IMPLANT

## 2014-04-21 NOTE — H&P (Signed)
[name] is an 49 y.o. male.   Chief Complaint: Patient is here for EGD and possible esophageal variceal banding HPI: Patient is 49 year old Caucasian male was cirrhosis secondary to excessive alcohol use and possibly NAFLD is here for esophagogastroduodenoscopy and treat varices if these have progressed in size. Last EGD was in June 2014 when he was found to have grade 1 esophageal varices and portal gastropathy. Patient denies nausea vomiting abdominal pain melena or rectal bleeding. He has chronic thrombocytopenia. Recent lab studies revealed mildly elevated serum ammonia of 70. He has not had any episodes of confusion or memory disorder.  Past Medical History  Diagnosis Date  . Gout   . Diabetes mellitus     type 2  . RA (rheumatoid arthritis)   . COPD (chronic obstructive pulmonary disease)     with cpap  . Atrial fibrillation   . Cirrhosis   . Anxiety   . Heart murmur   . CHF (congestive heart failure)   . Constipation   . Sleep apnea     cpap used- level 10 and greater  . Neuromuscular disorder     neuropathy in hands and feet  . Cirrhosis     NASH  . Thrombocytopenia   . Genu varus   . Psoriasis   . GERD (gastroesophageal reflux disease)     Past Surgical History  Procedure Laterality Date  . Sciatic nerve exploration    . Tonsillectomy    . Esophagogastroduodenoscopy  02/28/2011    Procedure: ESOPHAGOGASTRODUODENOSCOPY (EGD);  Surgeon: Rogene Houston, MD;  [facility];  Service: Endoscopy;  Laterality: N/A;  1200  . Colonoscopy  05/08/2011    Procedure: COLONOSCOPY;  Surgeon: Rogene Houston, MD;  [facility]: [facility];  Service: Endoscopy;  Laterality: N/A;  730  . Liver biopsy  2012  . Ankle surgery      right ankle talor repair  . Esophagogastroduodenoscopy N/A 06/11/2012    Procedure: ESOPHAGOGASTRODUODENOSCOPY (EGD);  Surgeon: Rogene Houston, MD;  [facility];  Service: Endoscopy;  Laterality: N/A;  1200  FYI patient is 400  pounds  . Hernia repair Right     as child; inguinal    Family History  Problem Relation Age of Onset  . Colon cancer Mother   . Cancer Mother     intestine  . Cancer Father     throat  . Cancer Brother     brain  . Cancer Sister    Social History:  reports that he has never smoked. His smokeless tobacco use includes Snuff and Chew. He reports that he drinks about 0.6 oz of alcohol per week. He reports that he does not use illicit drugs.  Allergies:  Allergies  Allergen Reactions  . Oxycodone Base Itching and Nausea And Vomiting  . Prozac [Fluoxetine Hcl] Itching    Medications Prior to Admission  Medication Sig Dispense Refill  . allopurinol (ZYLOPRIM) 300 MG tablet Take 300 mg by mouth as needed (as needed for gout).     . ALPRAZolam (XANAX) 0.5 MG tablet TAKE 1 TABLET TWICE DAILY AS NEEDED FOR ANXIETY 30 tablet 3  . aspirin 325 MG EC tablet Take 325 mg by mouth every 6 (six) hours as needed for pain.    . baclofen (LIORESAL) 10 MG tablet Take 10 mg by mouth 3 (three) times daily.    . betamethasone dipropionate (DIPROLENE) 0.05 % cream APPLY TO AFFECTED AREA TWICE A DAY 45 g 3  .  COLCRYS 0.6 MG tablet TAKE ONE TABLET BY MOUTH DAILY AS NEEDED (Patient taking differently: TAKE ONE TABLET BY MOUTH DAILY AS NEEDED GOUT FLARE.) 30 tablet 2  . diltiazem (CARDIZEM CD) 300 MG 24 hr capsule Take 1 capsule (300 mg total) by mouth daily. 30 capsule 11  . doxepin (SINEQUAN) 10 MG capsule Take 1 capsule (10 mg total) by mouth at bedtime. 30 capsule 1  . esomeprazole (NEXIUM) 40 MG capsule Take 1 capsule (40 mg total) by mouth daily. 30 capsule 5  . ezetimibe (ZETIA) 10 MG tablet Take 1 tablet (10 mg total) by mouth daily. 30 tablet 5  . fluticasone (FLONASE) 50 MCG/ACT nasal spray Place 2 sprays into both nostrils daily as needed. For allergies 16 g 1  . folic acid (FOLVITE) 031 MCG tablet Take 400 mcg by mouth daily.    Marland Kitchen gabapentin (NEURONTIN) 400 MG capsule Take 400 mg by mouth 3  (three) times daily.    Marland Kitchen ipratropium (ATROVENT) 0.02 % nebulizer solution Take 2.5 mLs (500 mcg total) by nebulization 4 (four) times daily as needed. For wheezing 75 mL 3  . lactulose (CHRONULAC) 10 GM/15ML solution Take 30 mLs (20 g total) by mouth 2 (two) times daily. 1892 mL 5  . Magnesium 400 MG CAPS Take 400 mg by mouth 2 (two) times daily. 30 capsule 0  . meloxicam (MOBIC) 7.5 MG tablet Take 7.5 mg by mouth 2 (two) times daily.    . metolazone (ZAROXOLYN) 2.5 MG tablet TAKE 1 TABLET 1/2 HOUR PRIOR TO TORSEMIDE FOR 3 DAYS AS NEEDED FOR SEVERE SWELLING 30 tablet 1  . milk thistle 175 MG tablet Take 175 mg by mouth 4 (four) times daily.     . MORPHINE SULFATE ER PO Take 15 mg by mouth every 6 (six) hours.    . Multiple Vitamins-Minerals (MULTI COMPLETE PO) Take 1 tablet by mouth daily.     . Omega-3 Fatty Acids (FISH OIL) 1000 MG CAPS Take 1 capsule by mouth daily.     . ONE TOUCH ULTRA TEST test strip     . phenylephrine (NASAL DECONGESTANT) 1 % nasal spray Place 1 drop into the nose every 4 (four) hours as needed. For allergies    . potassium chloride (MICRO-K) 10 MEQ CR capsule Take 2 capsules (20 mEq total) by mouth daily. 30 capsule 3  . PROAIR HFA 108 (90 BASE) MCG/ACT inhaler INHALE 2 PUFFS THREE TIMES A DAY AS DIRECTED 8.5 g 4  . tiZANidine (ZANAFLEX) 4 MG tablet Take 4 mg by mouth every 6 (six) hours as needed for muscle spasms.    Marland Kitchen torsemide (DEMADEX) 20 MG tablet Take 20 mg by mouth 3 (three) times daily.    . traZODone (DESYREL) 100 MG tablet Take 100 mg by mouth at bedtime.    Marland Kitchen aspirin EC 81 MG tablet Take 81 mg by mouth every 6 (six) hours as needed (CHEST PAIN).    . hydrocortisone 1 % ointment Apply 1 application topically 3 (three) times daily as needed. (Patient not taking: Reported on 04/08/2014) 30 g 1  . lidocaine (XYLOCAINE) 5 % ointment Apply 1 application topically 4 (four) times daily as needed. To knees and ankle as needed for pain 35.44 g 2  . Menthol-Methyl  Salicylate (MUSCLE RUB) 10-15 % CREA Apply 1 application topically as needed. For muscle pain    . neomycin-bacitracin-polymyxin (NEOSPORIN) ointment Apply 1 application topically every 12 (twelve) hours as needed. For cut on ankle    .  nitroGLYCERIN (NITROSTAT) 0.4 MG SL tablet Place 1 tablet (0.4 mg total) under the tongue every 5 (five) minutes as needed for chest pain (may repeat x3). 10 tablet 2    Results for orders placed or performed during the hospital encounter of 04/21/14 (from the past 48 hour(s))  Glucose, capillary     Status: Abnormal   Collection Time: 04/21/14  7:40 AM  Result Value Ref Range   Glucose-Capillary 106 (H) 70 - 99 mg/dL   No results found.  ROS  Blood pressure 114/73, pulse 78, temperature 97.6 F (36.4 C), temperature source Oral, resp. rate 17, height 6' (1.829 m), weight 400 lb (181.439 kg), SpO2 95 %. Physical Exam  Constitutional:  Well-developed obese Caucasian male in NAD.  HENT:  Mouth/Throat: Oropharynx is clear and moist.  Eyes: No scleral icterus.  Neck: No thyromegaly present.  Cardiovascular: Normal rate, regular rhythm and normal heart sounds.   No murmur heard. Respiratory: Effort normal and breath sounds normal.  GI:  Abdomen is obese. Difficult to palpate liver or spleen. No masses noted.  Musculoskeletal: He exhibits no edema.  Lymphadenopathy:    He has no cervical adenopathy.  Neurological: He is alert.  Skin: Skin is warm and dry.     Assessment/Plan Cirrhosis secondary to prior excessive alcohol use and he possibly also has NAFLD. EGD and possible esophageal variceal banding under monitored anesthesia care. Willis repeat serum ammonia today. Patient is on lactulose.  Amman Bartel U 04/21/2014, 8:15 AM

## 2014-04-21 NOTE — Transfer of Care (Signed)
Immediate Anesthesia Transfer of Care Note  Patient: [name] D Navratil  Procedure(s) Performed: Procedure(s): ESOPHAGOGASTRODUODENOSCOPY (EGD) WITH PROPOFOL (N/A)  Patient Location: PACU  Anesthesia Type:MAC  Level of Consciousness: sedated and patient cooperative  Airway & Oxygen Therapy: Patient Spontanous Breathing and non-rebreather face mask  Post-op Assessment: Report given to RN, Post -op Vital signs reviewed and stable and Patient moving all extremities  Post vital signs: Reviewed and stable    Complications: No apparent anesthesia complications

## 2014-04-21 NOTE — Anesthesia Procedure Notes (Signed)
Procedure Name: MAC Date/Time: 04/21/2014 8:39 AM Performed by: Vista Deck Pre-anesthesia Checklist: Patient identified, Emergency Drugs available, Suction available, Timeout performed and Patient being monitored Patient Re-evaluated:Patient Re-evaluated prior to inductionOxygen Delivery Method: Non-rebreather mask

## 2014-04-21 NOTE — Anesthesia Postprocedure Evaluation (Signed)
  Anesthesia Post-op Note  Patient: [name]  Procedure(s) Performed: Procedure(s): ESOPHAGOGASTRODUODENOSCOPY (EGD) WITH PROPOFOL (N/A)  Patient Location: Short Stay  Anesthesia Type:MAC  Level of Consciousness: awake, alert  and patient cooperative  Airway and Oxygen Therapy: Patient Spontanous Breathing  Post-op Pain: mild  Post-op Assessment: Post-op Vital signs reviewed, Patient's Cardiovascular Status Stable, Respiratory Function Stable, Patent Airway and No signs of Nausea or vomiting  Post-op Vital Signs: Reviewed and stable    Complications: No apparent anesthesia complications

## 2014-04-21 NOTE — Anesthesia Preprocedure Evaluation (Signed)
Anesthesia Evaluation  Patient identified by MRN, date of birth, ID band Patient awake    Reviewed: Allergy & Precautions, NPO status , Patient's Chart, lab work & pertinent test results  Airway Mallampati: II  TM Distance: >3 FB     Dental  (+) Teeth Intact   Pulmonary asthma , sleep apnea , COPD breath sounds clear to auscultation        Cardiovascular + CAD and +CHF + dysrhythmias (in NSR now) Atrial Fibrillation Rhythm:Regular Rate:Normal     Neuro/Psych PSYCHIATRIC DISORDERS Anxiety  Neuromuscular disease    GI/Hepatic GERD-  ,  Endo/Other  diabetes, Type 2Morbid obesity  Renal/GU      Musculoskeletal  (+) Arthritis -, Rheumatoid disorders,    Abdominal   Peds  Hematology   Anesthesia Other Findings   Reproductive/Obstetrics                             Anesthesia Physical Anesthesia Plan  ASA: III  Anesthesia Plan: MAC   Post-op Pain Management:    Induction: Intravenous  Airway Management Planned: Simple Face Mask  Additional Equipment:   Intra-op Plan:   Post-operative Plan:   Informed Consent: I have reviewed the patients History and Physical, chart, labs and discussed the procedure including the risks, benefits and alternatives for the proposed anesthesia with the patient or authorized representative who has indicated his/her understanding and acceptance.     Plan Discussed with:   Anesthesia Plan Comments:         Anesthesia Quick Evaluation

## 2014-04-21 NOTE — Discharge Instructions (Signed)
Resume usual medications including baby aspirin but do not take full dose aspirin. Resume usual diet. No driving for 24 hours.   Esophagogastroduodenoscopy Care After Refer to this sheet in the next few weeks. These instructions provide you with information on caring for yourself after your procedure. Your caregiver may also give you more specific instructions. Your treatment has been planned according to current medical practices, but problems sometimes occur. Call your caregiver if you have any problems or questions after your procedure.  HOME CARE INSTRUCTIONS  Do not eat or drink anything until the numbing medicine (local anesthetic) has worn off and your gag reflex has returned. You will know that the local anesthetic has worn off when you can swallow comfortably.  Do not drive for 12 hours after the procedure or as directed by your caregiver.  Only take medicines as directed by your caregiver. SEEK MEDICAL CARE IF:   You cannot stop coughing.  You are not urinating at all or less than usual. SEEK IMMEDIATE MEDICAL CARE IF:  You have difficulty swallowing.  You cannot eat or drink.  You have worsening throat or chest pain.  You have dizziness, lightheadedness, or you faint.  You have nausea or vomiting.  You have chills.  You have a fever.  You have severe abdominal pain.  You have black, tarry, or bloody stools. Document Released: 12/12/2011 Document Reviewed: 12/12/2011 [facility] Patient Information 2015 [place]. This information is not intended to replace advice given to you by your health care provider. Make sure you discuss any questions you have with your health care provider.

## 2014-04-21 NOTE — Op Note (Signed)
EGD PROCEDURE REPORT  PATIENT:  [name]  MR#:  [id number] Birthdate:  1965/07/24, 49 y.o., male Endoscopist:  Dr. Rogene Houston, MD  Procedure Date: 04/21/2014  Procedure:   EGD  Indications:  Patient is 49 year old Caucasian male was advanced cirrhosis and is undergoing EGD with possible esophageal variceal banding. Last EGD was in June 2014 and he was found to have grade 1 esophageal varices.            Informed Consent:  The risks, benefits, alternatives & imponderables which include, but are not limited to, bleeding, infection, perforation, drug reaction and potential missed lesion have been reviewed.  The potential for biopsy, lesion removal, esophageal dilation, etc. have also been discussed.  Questions have been answered.  All parties agreeable.  Please see history & physical in medical record for more information.  Medications:  Monitored anesthesia care. Cetacaine spray topically for oropharyngeal anesthesia  Description of procedure:  The endoscope was introduced through the mouth and advanced to the second portion of the duodenum without difficulty or limitations. The mucosal surfaces were surveyed very carefully during advancement of the scope and upon withdrawal.  Findings:  Esophagus:  Mucosa of the proximal and middle third was normal. 2 short columns of grade 1 esophageal varices noted distally. Wavy GE junction. Small area below the Z line covered with specks of fresh blood. Area washed and no erosion or AV malformations noted. GEJ:  43 cm Stomach:  Stomach was empty and distended very well with insufflation. Folds in the proximal stomach were normal. Examination of mucosa at gastric body revealed mosaic pattern. Multiple antral nodules noted: Erythematous mucosa and erosions. Pyloric channel was patent. Angularis was unremarkable and no fundal varices identified. Duodenum:  Normal bulbar and post bulbar mucosa.  Therapeutic/Diagnostic Maneuvers Performed:   None  Complications:  None  Impression: Two short columns of grade 1 esophageal varices. Portal gastropathy with antral nodules. Biopsy of these nodules in February 2013 revealed changes of portal hypertension. Speck blood noted just below the Z line secondary to portal gastropathy.  Recommendations:  Patient advised not to take full dose aspirin he can stay on low-dose aspirin. Office visit in 6 months.  [name] U  04/21/2014  9:08 AM  CC: Dr. Redge Gainer, MD & Dr. Rayne Du ref. provider found

## 2014-04-22 ENCOUNTER — Encounter (HOSPITAL_COMMUNITY): Payer: Self-pay | Admitting: Internal Medicine

## 2014-04-22 NOTE — Telephone Encounter (Signed)
Last seen 11/03/13 B [place]  If approved route to nurse to call into [facility]

## 2014-04-22 NOTE — Telephone Encounter (Signed)
Please call in xanax with 1 refills 

## 2014-04-23 ENCOUNTER — Telehealth: Payer: Self-pay | Admitting: *Deleted

## 2014-04-23 NOTE — Telephone Encounter (Signed)
Xanax called in 

## 2014-04-29 ENCOUNTER — Encounter (INDEPENDENT_AMBULATORY_CARE_PROVIDER_SITE_OTHER): Payer: Self-pay

## 2014-05-05 ENCOUNTER — Encounter: Payer: Self-pay | Admitting: Cardiology

## 2014-05-05 ENCOUNTER — Ambulatory Visit (INDEPENDENT_AMBULATORY_CARE_PROVIDER_SITE_OTHER): Payer: Medicare HMO | Admitting: Cardiology

## 2014-05-05 VITALS — BP 124/69 | HR 92 | Ht 72.0 in | Wt 388.0 lb

## 2014-05-05 DIAGNOSIS — I5032 Chronic diastolic (congestive) heart failure: Secondary | ICD-10-CM | POA: Diagnosis not present

## 2014-05-05 DIAGNOSIS — Z79899 Other long term (current) drug therapy: Secondary | ICD-10-CM

## 2014-05-05 NOTE — Progress Notes (Signed)
HPI The patient returns for follow up of diastolic heart failure and a history of atrial fibrillation. Since I last saw him he says that he has had no acute cardiovascular complaints. I increased his Cardizem previously and his palpitations seem to have improved. He does not have new symptoms such as chest discomfort, neck or arm discomfort. There has been no new shortness of breath, PND or orthopnea. There has been no presyncope or syncope.  He still has back pain but he can do some things like painting.     Allergies  Allergen Reactions  . Oxycodone Base Itching and Nausea And Vomiting  . Prozac [Fluoxetine Hcl] Itching    Current Outpatient Prescriptions  Medication Sig Dispense Refill  . allopurinol (ZYLOPRIM) 300 MG tablet Take 300 mg by mouth as needed (as needed for gout).     . ALPRAZolam (XANAX) 0.5 MG tablet TAKE ONE TABLET TWICE DAILY AS NEEDED 30 tablet 0  . aspirin EC 81 MG tablet Take 81 mg by mouth every 6 (six) hours as needed (CHEST PAIN).    Marland Kitchen baclofen (LIORESAL) 10 MG tablet Take 10 mg by mouth 3 (three) times daily.    . betamethasone dipropionate (DIPROLENE) 0.05 % cream APPLY TO AFFECTED AREA TWICE A DAY 45 g 3  . COLCRYS 0.6 MG tablet TAKE ONE TABLET BY MOUTH DAILY AS NEEDED (Patient taking differently: TAKE ONE TABLET BY MOUTH DAILY AS NEEDED GOUT FLARE.) 30 tablet 2  . diltiazem (CARDIZEM CD) 300 MG 24 hr capsule Take 1 capsule (300 mg total) by mouth daily. 30 capsule 11  . doxepin (SINEQUAN) 10 MG capsule Take 1 capsule (10 mg total) by mouth at bedtime. 30 capsule 1  . esomeprazole (NEXIUM) 40 MG capsule Take 1 capsule (40 mg total) by mouth daily. 30 capsule 5  . ezetimibe (ZETIA) 10 MG tablet Take 1 tablet (10 mg total) by mouth daily. 30 tablet 5  . fluticasone (FLONASE) 50 MCG/ACT nasal spray Place 2 sprays into both nostrils daily as needed. For allergies 16 g 1  . folic acid (FOLVITE) 503 MCG tablet Take 400 mcg by mouth daily.    Marland Kitchen gabapentin (NEURONTIN)  400 MG capsule Take 400 mg by mouth 3 (three) times daily.    . hydrocortisone 1 % ointment Apply 1 application topically 3 (three) times daily as needed. 30 g 1  . HYDROmorphone (DILAUDID) 4 MG tablet Take 4 mg by mouth 4 (four) times daily as needed for severe pain.     . indomethacin (INDOCIN) 50 MG capsule     . ipratropium (ATROVENT) 0.02 % nebulizer solution Take 2.5 mLs (500 mcg total) by nebulization 4 (four) times daily as needed. For wheezing 75 mL 3  . lactulose (CHRONULAC) 10 GM/15ML solution Take 30 mLs (20 g total) by mouth 2 (two) times daily. 1892 mL 5  . lidocaine (XYLOCAINE) 5 % ointment Apply 1 application topically 4 (four) times daily as needed. To knees and ankle as needed for pain 35.44 g 2  . Magnesium 400 MG CAPS Take 400 mg by mouth 2 (two) times daily. 30 capsule 0  . meloxicam (MOBIC) 7.5 MG tablet Take 7.5 mg by mouth 2 (two) times daily.    . Menthol-Methyl Salicylate (MUSCLE RUB) 10-15 % CREA Apply 1 application topically as needed. For muscle pain    . metolazone (ZAROXOLYN) 2.5 MG tablet TAKE 1 TABLET 1/2 HOUR PRIOR TO TORSEMIDE FOR 3 DAYS AS NEEDED FOR SEVERE SWELLING (Patient taking differently:  Take 2.5 mg by mouth daily. TAKE 1 TABLET 1/2 HOUR PRIOR TO TORSEMIDE FOR 3 DAYS AS NEEDED FOR SEVERE SWELLING) 30 tablet 1  . milk thistle 175 MG tablet Take 175 mg by mouth 4 (four) times daily.     . Multiple Vitamins-Minerals (MULTI COMPLETE PO) Take 1 tablet by mouth daily.     Marland Kitchen neomycin-bacitracin-polymyxin (NEOSPORIN) ointment Apply 1 application topically every 12 (twelve) hours as needed. For cut on ankle    . nitroGLYCERIN (NITROSTAT) 0.4 MG SL tablet Place 1 tablet (0.4 mg total) under the tongue every 5 (five) minutes as needed for chest pain (may repeat x3). 10 tablet 2  . Omega-3 Fatty Acids (FISH OIL) 1000 MG CAPS Take 1 capsule by mouth daily.     . ONE TOUCH ULTRA TEST test strip     . phenylephrine (NASAL DECONGESTANT) 1 % nasal spray Place 1 drop into  the nose every 4 (four) hours as needed. For allergies    . potassium chloride (MICRO-K) 10 MEQ CR capsule Take 2 capsules (20 mEq total) by mouth daily. 30 capsule 3  . PROAIR HFA 108 (90 BASE) MCG/ACT inhaler INHALE 2 PUFFS THREE TIMES A DAY AS DIRECTED 8.5 g 4  . tiZANidine (ZANAFLEX) 4 MG tablet Take 4 mg by mouth every 6 (six) hours as needed for muscle spasms.    Marland Kitchen torsemide (DEMADEX) 20 MG tablet Take 20 mg by mouth 3 (three) times daily.     No current facility-administered medications for this visit.    Past Medical History  Diagnosis Date  . Gout   . Diabetes mellitus     type 2  . RA (rheumatoid arthritis)   . COPD (chronic obstructive pulmonary disease)     with cpap  . Atrial fibrillation   . Cirrhosis   . Anxiety   . Heart murmur   . CHF (congestive heart failure)   . Constipation   . Sleep apnea     cpap used- level 10 and greater  . Neuromuscular disorder     neuropathy in hands and feet  . Cirrhosis     NASH  . Thrombocytopenia   . Genu varus   . Psoriasis   . GERD (gastroesophageal reflux disease)     Past Surgical History  Procedure Laterality Date  . Sciatic nerve exploration    . Tonsillectomy    . Esophagogastroduodenoscopy  02/28/2011    Procedure: ESOPHAGOGASTRODUODENOSCOPY (EGD);  Surgeon: Rogene Houston, MD;  [facility];  Service: Endoscopy;  Laterality: N/A;  1200  . Colonoscopy  05/08/2011    Procedure: COLONOSCOPY;  Surgeon: Rogene Houston, MD;  [facility];  Service: Endoscopy;  Laterality: N/A;  730  . Liver biopsy  2012  . Ankle surgery      right ankle talor repair  . Esophagogastroduodenoscopy N/A 06/11/2012    Procedure: ESOPHAGOGASTRODUODENOSCOPY (EGD);  Surgeon: Rogene Houston, MD;  [facility]: [facility];  Service: Endoscopy;  Laterality: N/A;  1200  FYI patient is 400 pounds  . Hernia repair Right     as child; inguinal  . Esophagogastroduodenoscopy (egd) with propofol N/A 04/21/2014    Procedure:  ESOPHAGOGASTRODUODENOSCOPY (EGD) WITH PROPOFOL;  Surgeon: Rogene Houston, MD;  Location: [facility];  Service: Endoscopy;  Laterality: N/A;    ROS:  Back pain.  Otherwise as stated in the HPI and negative for all other systems.  PHYSICAL EXAM BP 124/69 mmHg  Pulse 92  Ht 6' (  1.829 m)  Wt 388 lb (175.996 kg)  BMI 52.61 kg/m2 GENERAL:  Well appearing NECK:  No jugular venous distention, waveform within normal limits, carotid upstroke brisk and symmetric, no bruits, no thyromegaly LUNGS:  Clear to auscultation bilaterally BACK:  No CVA tenderness CHEST:  Unremarkable HEART:  PMI not displaced or sustained,S1 and S2 within normal limits, no S3, no S4, no clicks, no rubs, no murmurs ABD:  Flat, positive bowel sounds normal in frequency in pitch, no bruits, no rebound, no guarding, no midline pulsatile mass, no hepatomegaly, no splenomegaly, morbidly obese EXT:  2 plus pulses throughout, trace edema, no cyanosis no clubbing SKIN:  Psoriasis, chronic venous stasis changes left greater than right leg   EKG:  Sinus rhythm, rate 92, axis within normal limits, intervals with prolonged QT, nonspecivic ST-T wave changes new since previous.. 05/05/2014   ASSESSMENT AND PLAN   ATRIAL FIBRILLATION - He is not feeling any further dysrhythmias. He chooses not to be on anticoagulation. He says he can't swallow any of the pills. Since he is not symptomatic no change in therapy is indicated.  OBESITY, MORBID -  We have discussed this at multiple appointments.  I am very proud of him because his weight continues to come down.  DIASTOLIC HEART FAILURE, CHRONIC - He seems to be euvolemic. His EF was 65% on his echo last year. No further evaluation or change in therapy is indicated.  ABNORMAL EKG - He had some changes in his EKG as described. I looked back and his last potassiums have been low. He forgets to take his potassium pill. I will check a basic metabolic profile.

## 2014-05-05 NOTE — Patient Instructions (Signed)
Medication Instructions:  Your physician recommends that you continue on your current medications as directed. Please refer to the Current Medication list given to you today.  Labwork: Please have BMP today at [facility]  Testing/Procedures: none  Follow-Up: Follow up in 1 year with Dr. Percival Spanish in [place].  You will receive a letter in the mail 2 months before you are due.  Please call us when you receive this letter to schedule your follow up appointment.  Thank you for choosing [facility]!!

## 2014-05-27 ENCOUNTER — Telehealth: Payer: Self-pay | Admitting: Family Medicine

## 2014-05-27 ENCOUNTER — Other Ambulatory Visit: Payer: Self-pay | Admitting: Anesthesiology

## 2014-05-27 DIAGNOSIS — M898X2 Other specified disorders of bone, upper arm: Secondary | ICD-10-CM

## 2014-05-27 DIAGNOSIS — M79621 Pain in right upper arm: Secondary | ICD-10-CM

## 2014-06-02 NOTE — Telephone Encounter (Signed)
Barnett Applebaum do you know about this one?

## 2014-06-03 NOTE — Telephone Encounter (Signed)
Yes, ov notes faxed today 06/03/14

## 2014-06-24 ENCOUNTER — Other Ambulatory Visit: Payer: Self-pay | Admitting: Nurse Practitioner

## 2014-06-24 NOTE — Telephone Encounter (Signed)
Last seen 11/03/13 B [place]  If approved route to [facility]

## 2014-06-24 NOTE — Telephone Encounter (Signed)
This is okay to refill 1 and the patient must be seen for further refills.

## 2014-07-05 ENCOUNTER — Other Ambulatory Visit: Payer: Self-pay

## 2014-07-05 ENCOUNTER — Telehealth: Payer: Self-pay | Admitting: Pulmonary Disease

## 2014-07-05 MED ORDER — LIDOCAINE 5 % EX OINT: 1.0000 "application " | TOPICAL_OINTMENT | Freq: Four times a day (QID) | CUTANEOUS | Status: DC | PRN

## 2014-07-05 NOTE — Telephone Encounter (Signed)
Last seen 11/03/13 B [place]

## 2014-07-05 NOTE — Telephone Encounter (Signed)
Attempted to contact pt. No answer, no option to leave a message. Will try back later.

## 2014-07-06 NOTE — Telephone Encounter (Signed)
Called pt and LMTCB on vm

## 2014-07-06 NOTE — Telephone Encounter (Signed)
Patient needs a new CPAP and supplies.   Patient says that he was not aware that he needed to be seen yearly.  He said that it is hard for him to get here from [place] because he does not have a license or a vehicle and doesn't have anyone to bring him.  He says that Dr. Elsworth Soho is the only doctor he has left in [place] because he cannot get to [place]. Patient would like Dr. Elsworth Soho to approve an order without having to come in for appointment. Advised patient that Dr. Elsworth Soho is out of office today and will be address tomorrow.   Patient okay with waiting for Dr. Elsworth Soho to address tomorrow.  RA - please advise.

## 2014-07-06 NOTE — Telephone Encounter (Signed)
Patient returned call, may be reached at [phone].

## 2014-07-07 NOTE — Telephone Encounter (Signed)
Called and spoke to pt. Informed pt of the recs per RA. Appt made with TP on 7/25. Pt verbalized understanding and denied any further questions or concerns at this time.

## 2014-07-07 NOTE — Telephone Encounter (Signed)
Last seen 2014  WIll need to be seen by TP or me to renew supplies

## 2014-07-14 DIAGNOSIS — F1011 Alcohol abuse, in remission: Secondary | ICD-10-CM | POA: Insufficient documentation

## 2014-07-14 DIAGNOSIS — K8050 Calculus of bile duct without cholangitis or cholecystitis without obstruction: Secondary | ICD-10-CM | POA: Insufficient documentation

## 2014-07-14 DIAGNOSIS — I509 Heart failure, unspecified: Secondary | ICD-10-CM | POA: Insufficient documentation

## 2014-07-15 ENCOUNTER — Other Ambulatory Visit: Payer: Medicare HMO

## 2014-07-27 ENCOUNTER — Other Ambulatory Visit: Payer: Self-pay | Admitting: Nurse Practitioner

## 2014-07-27 ENCOUNTER — Other Ambulatory Visit: Payer: Self-pay | Admitting: *Deleted

## 2014-08-02 ENCOUNTER — Ambulatory Visit: Payer: Medicare HMO | Admitting: Adult Health

## 2014-08-17 ENCOUNTER — Telehealth: Payer: Self-pay | Admitting: Pulmonary Disease

## 2014-08-17 NOTE — Telephone Encounter (Signed)
Noted  

## 2014-08-17 NOTE — Telephone Encounter (Signed)
lmtcb X1 for [place] with [facility].

## 2014-08-17 NOTE — Telephone Encounter (Signed)
Spoke with [facility], states that they do not have an updated D/L on this patient. They have contacted him and he is bringing his card in on 08/20/14 to be downloaded and they will fax over report at that time. Will send to [facility] to keep an eye out for D/L.

## 2014-08-18 ENCOUNTER — Encounter (INDEPENDENT_AMBULATORY_CARE_PROVIDER_SITE_OTHER): Payer: Self-pay

## 2014-08-18 ENCOUNTER — Ambulatory Visit: Payer: Medicare HMO | Admitting: Adult Health

## 2014-08-23 ENCOUNTER — Other Ambulatory Visit: Payer: Self-pay | Admitting: Nurse Practitioner

## 2014-08-24 ENCOUNTER — Telehealth: Payer: Self-pay | Admitting: Cardiology

## 2014-08-24 DIAGNOSIS — E876 Hypokalemia: Secondary | ICD-10-CM | POA: Diagnosis present

## 2014-08-24 DIAGNOSIS — J449 Chronic obstructive pulmonary disease, unspecified: Secondary | ICD-10-CM | POA: Insufficient documentation

## 2014-08-24 DIAGNOSIS — K766 Portal hypertension: Secondary | ICD-10-CM | POA: Insufficient documentation

## 2014-08-24 NOTE — Telephone Encounter (Signed)
Advised patient that he needs to go to the hospital to get his potasium replaced IV because it is too low to get replaced by mouth.  Patient wants a refill for his potasium pills and I told him after he has his potasium replaced at the hospital we would be able to get him a refill for his prescription.

## 2014-08-24 NOTE — Telephone Encounter (Signed)
Patient states he had lab work done and was told he had low potassium level--his physician at [facility] told him to go to the hospital----please call ASAP

## 2014-09-06 ENCOUNTER — Telehealth: Payer: Self-pay | Admitting: Cardiology

## 2014-09-06 DIAGNOSIS — E876 Hypokalemia: Secondary | ICD-10-CM

## 2014-09-06 NOTE — Telephone Encounter (Signed)
Returned call to patient no answer.LMTC. 

## 2014-09-06 NOTE — Telephone Encounter (Signed)
Patient is returning a call from [place].

## 2014-09-06 NOTE — Telephone Encounter (Signed)
Received call back from patient requesting refill on potassium tablets.After reviewing chart Dr.Hochrein at last visit 05/05/14 advised to have bmet.Stated he was not able to go to lab.Stated he had pre surgery lab work done at [facility] 08/24/14 and potassium level 2.5.He was told to go to ER at [facility] in [place].Potassium level at [place] 2.65.He was given IV potassium and a potassium prescription.He has 15 tablets left and is requesting refill.Advised he needs to have potassium level checked tomorrow  8/30.Stated he will have done at [facility] lab in [place].Order entered.Advised Dr.Hochrein will review potassium level before we send in a refill.

## 2014-09-06 NOTE — Telephone Encounter (Signed)
Pt called in stating that he went to the hospital to have his Potassium levels regulated and while he was there the doctor wrote him a short term RX for Potassium. The pt wanted to know if Dr. Percival Spanish would manage his Potassium moving forward. He is currently running low and will need refills called in to [facility] in [place]

## 2014-09-06 NOTE — Telephone Encounter (Signed)
[name] is returning your call . Please call back

## 2014-09-07 LAB — BASIC METABOLIC PANEL
BUN: 9 mg/dL (ref 7–25)
CALCIUM: 8.8 mg/dL (ref 8.6–10.3)
CO2: 26 mmol/L (ref 20–31)
CREATININE: 0.67 mg/dL — AB (ref 0.70–1.33)
Chloride: 106 mmol/L (ref 98–110)
GLUCOSE: 100 mg/dL — AB (ref 65–99)
Potassium: 3.9 mmol/L (ref 3.5–5.3)
SODIUM: 141 mmol/L (ref 135–146)

## 2014-09-08 ENCOUNTER — Telehealth: Payer: Self-pay | Admitting: *Deleted

## 2014-09-08 DIAGNOSIS — G4733 Obstructive sleep apnea (adult) (pediatric): Secondary | ICD-10-CM

## 2014-09-08 NOTE — Telephone Encounter (Signed)
We have been trying to obtain download from this patient's machine.. Patient was supposed to mail card to [facility] in order to download the card.   Message sent to [facility] requesting download.  As of 09/08/14:  Msg from [place]: No, we never did receive it. Patient was supposed to be dropping card off at [facility] for download, which he did not. We also followed up by emailing the patient our address to mail the card. We have not received it by mail either.   Called patient, patient says that he has not mailed card yet, he says that he will get the card to them by the end of this week. Will hold in box until we have received download.

## 2014-09-09 HISTORY — PX: CHOLECYSTECTOMY: SHX55

## 2014-09-09 HISTORY — PX: HERNIA REPAIR: SHX51

## 2014-09-15 ENCOUNTER — Other Ambulatory Visit: Payer: Self-pay | Admitting: *Deleted

## 2014-09-15 DIAGNOSIS — I25709 Atherosclerosis of coronary artery bypass graft(s), unspecified, with unspecified angina pectoris: Secondary | ICD-10-CM

## 2014-09-15 MED ORDER — POTASSIUM CHLORIDE CRYS ER 20 MEQ PO TBCR: 20.0000 meq | EXTENDED_RELEASE_TABLET | Freq: Two times a day (BID) | ORAL | Status: DC

## 2014-09-16 NOTE — Telephone Encounter (Signed)
Patient says that he mailed out his SD card yesterday to obtain download.  Patient does not want to use [facility] any longer, they do not take his insurance He has [employer].  Patient needs new machine.  Patient advised that I cannot put order in for new machine without the download because I need the settings information from the download Advised patient that I would call him back once I have received the download and he can tell me what company he wanted me to send the order too. Awaiting download.

## 2014-09-22 NOTE — Telephone Encounter (Signed)
msg sent to [facility] requesting download. Awaiting response.

## 2014-09-23 NOTE — Telephone Encounter (Signed)
[name] says that they received SD card and will send download today Awaiting download

## 2014-09-28 NOTE — Telephone Encounter (Signed)
Message sent to [facility] regarding download Awaiting DL

## 2014-09-30 ENCOUNTER — Telehealth: Payer: Self-pay | Admitting: Pulmonary Disease

## 2014-09-30 NOTE — Telephone Encounter (Signed)
Spoke with pt, states that he sent in his chip to [facility] to get a download and has not received his chip back yet.  I asked patient if he's contacted [facility].  He has not. Called [facility], states that they are sending a new card in the mail today to the pt.    Also, pt states that he wishes to change DME providers to someone who covers his insurance better, but does not know the name of the company.  I advised that once he finds the name of the company to let us know and we can get this process started for him.    RA/Michelle, have you received this download yet?

## 2014-09-30 NOTE — Telephone Encounter (Signed)
No, but I have already spoken to [facility] at [facility] regarding this patient and she is sending the download. Patient did not submit the card to them until a couple of days ago Awaiting download still, see my notes on TE 09/08/14

## 2014-10-01 NOTE — Telephone Encounter (Signed)
Received DL in RA's look at Awaiting RA response

## 2014-10-06 NOTE — Telephone Encounter (Signed)
Pt is aware that we will place an order for a new machine. Nothing further was needed.

## 2014-10-06 NOTE — Telephone Encounter (Signed)
Please have DME checkout CPAP machine and see if he will qualify for new one As per download it is working well

## 2014-10-06 NOTE — Telephone Encounter (Signed)
Per Download - 12cm Good usage No residuals.  Patient notified. Patient says that he thinks his machine is wore out, it is not processing fresh air, he says it feels like it is harder to breath on the machine, he says he has changed the filters, but it still doesn't produce much air.  He wants Dr. Elsworth Soho to put in an order for a new machine for him.   Dr. Elsworth Soho, please advise.

## 2014-10-18 DIAGNOSIS — M1A09X0 Idiopathic chronic gout, multiple sites, without tophus (tophi): Secondary | ICD-10-CM | POA: Diagnosis not present

## 2014-10-28 DIAGNOSIS — M25461 Effusion, right knee: Secondary | ICD-10-CM | POA: Diagnosis not present

## 2014-10-28 DIAGNOSIS — S8391XS Sprain of unspecified site of right knee, sequela: Secondary | ICD-10-CM | POA: Diagnosis not present

## 2014-10-28 DIAGNOSIS — M25561 Pain in right knee: Secondary | ICD-10-CM | POA: Diagnosis not present

## 2014-10-28 DIAGNOSIS — M1990 Unspecified osteoarthritis, unspecified site: Secondary | ICD-10-CM | POA: Diagnosis not present

## 2014-11-02 ENCOUNTER — Encounter: Payer: Self-pay | Admitting: Pulmonary Disease

## 2014-11-15 ENCOUNTER — Telehealth (INDEPENDENT_AMBULATORY_CARE_PROVIDER_SITE_OTHER): Payer: Self-pay | Admitting: *Deleted

## 2014-11-15 NOTE — Telephone Encounter (Signed)
Message left on phone.

## 2014-11-15 NOTE — Telephone Encounter (Signed)
[facility]ares would like to speak with Deberah Castle, NP. He has developed a cough. The return phone number is [phone].

## 2014-11-16 ENCOUNTER — Ambulatory Visit (INDEPENDENT_AMBULATORY_CARE_PROVIDER_SITE_OTHER): Payer: Medicare HMO | Admitting: Internal Medicine

## 2014-11-16 ENCOUNTER — Telehealth: Payer: Self-pay | Admitting: Pulmonary Disease

## 2014-11-16 ENCOUNTER — Encounter (INDEPENDENT_AMBULATORY_CARE_PROVIDER_SITE_OTHER): Payer: Self-pay | Admitting: Internal Medicine

## 2014-11-16 VITALS — BP 110/60 | HR 72 | Temp 97.6°F | Ht 72.0 in | Wt 381.4 lb

## 2014-11-16 DIAGNOSIS — R609 Edema, unspecified: Secondary | ICD-10-CM | POA: Diagnosis not present

## 2014-11-16 DIAGNOSIS — K7030 Alcoholic cirrhosis of liver without ascites: Secondary | ICD-10-CM

## 2014-11-16 DIAGNOSIS — R600 Localized edema: Secondary | ICD-10-CM

## 2014-11-16 DIAGNOSIS — R69 Illness, unspecified: Secondary | ICD-10-CM | POA: Diagnosis not present

## 2014-11-16 MED ORDER — LACTULOSE 10 GM/15ML PO SOLN: 20.0000 g | Freq: Two times a day (BID) | ORAL | Status: DC

## 2014-11-16 MED ORDER — METOLAZONE 2.5 MG PO TABS: 2.5000 mg | ORAL_TABLET | Freq: Every day | ORAL | Status: DC

## 2014-11-16 MED ORDER — TORSEMIDE 20 MG PO TABS: 20.0000 mg | ORAL_TABLET | Freq: Three times a day (TID) | ORAL | Status: DC

## 2014-11-16 NOTE — Telephone Encounter (Signed)
He has OV today

## 2014-11-16 NOTE — Patient Instructions (Signed)
OV in 6 months. CBC and Hepatic function.

## 2014-11-16 NOTE — Telephone Encounter (Signed)
Apt has been scheduled for 11/16/14 with Deberah Castle, NP.

## 2014-11-16 NOTE — Progress Notes (Signed)
Subjective:    Patient ID: [name], male    DOB: 07/14/1965, 49 y.o.   MRN: [id number]  HPI He was last seen by Dr. Laural Golden in March of this year. His weight in March was 395. Today his weight ie 381.  Hx of cirrhosis secondary to alcohol and possible NAFLD. Here for scheduled visit.  Hx of atrial fib and has been hospitalized at [facility] briefly.  Recently underwent cholecystectomy at [facility] with a liver biopsy. See below.  He tells me he has good days and bad days. He says his legs hurt all the time. He does not drive. He had a friend bring him here today. He denies any ascites.  His appetite is good. He has a BM 1-2 times a day. No periods of confusion. His Hepatitis markers have been negative. Auto immune process has been ruled out.     CBC Latest Ref Rng 04/16/2014 11/03/2013 08/03/2013  WBC 4.0 - 10.5 K/uL 2.9(L) 3.0(L) 4.5(A)  Hemoglobin 13.0 - 17.0 g/dL 12.9(L) 12.8 13.3(A)  Hematocrit 39.0 - 52.0 % 40.2 38.0 39.4(A)  Platelets 150 - 400 K/uL 64(L) CANCELED -    Hepatic Function Panel     Component Value Date/Time   PROT 7.6 03/09/2014 1051   PROT 7.0 11/03/2013 1032   ALBUMIN 4.5 03/09/2014 1051   ALBUMIN 4.4 11/03/2013 1032   AST 52* 03/09/2014 1051   ALT 41 03/09/2014 1051   ALKPHOS 79 03/09/2014 1051   BILITOT 1.0 03/09/2014 1051   BILIDIR 0.3 03/09/2014 1051   IBILI 0.7 03/09/2014 1051        04/29/2014 US abdomen: cirrhosis Hepatic cirrhosis. No masses. Moderate splenomegaly, no ascites.        09/28/2014 A.  GALLBLADDER, CHOLECYSTECTOMY:      Chronic cholecystitis.  B.  LIVER, NEEDLE CORE BIOPSY: Cirrhosis Minimal macrovesicular steatosis. Mild portal inflammation. No evidence of active hepatitis, Mallory's hyaline, cholestasis or bile duct abnormalities. See comment:  COMMENT: The core biopsies show a nodular poliferarion of hepatocyts with bridging and circumferential fibrosis highlighted by trichrome and reticulin stains, findings most  consisent with cirrhosis. Additionally there is minimal periportal inflammation present.The underlying etiology for cirrhosis in this case cannot be detrmined based on histologic examination alone. Clinical and serologic studies are suggested to rule out a burnt-out alcoholic or non-alcoholic steatohepatitis or burnt-out autoimmune Hepatitis. materials referenced, and have edited the report as part of my pathologic assessment and final interpretation.  sb/wgs  Specimen(s) Received Clinical History      Biliary colic         0/30/0923   EGD  Indications:  Patient is 49 year old Caucasian male was advanced cirrhosis and is undergoing EGD with possible esophageal variceal banding. Last EGD was in June 2014 and he was found to  Impression: Two short columns of grade 1 esophageal varices. Portal gastropathy with antral nodules. Biopsy of these nodules in February 2013 revealed changes of portal hypertension. Speck blood noted just below the Z line secondary to portal gastropathy.have grade 1 esophageal varices.           Review of Systems Past Medical History  Diagnosis Date  . Gout   . Diabetes mellitus     type 2  . RA (rheumatoid arthritis) ([place])   . COPD (chronic obstructive pulmonary disease) ([place])     with cpap  . Atrial fibrillation ([place])   . Cirrhosis ([place])   . Anxiety   . Heart murmur   . CHF (congestive heart  failure) ([place])   . Constipation   . Sleep apnea     cpap used- level 10 and greater  . Neuromuscular disorder (H[facility])     neuropathy in hands and feet  . Cirrhosis ([place])     NASH  . Thrombocytopenia ([place])   . Genu varus   . Psoriasis   . GERD (gastroesophageal reflux disease)     Past Surgical History  Procedure Laterality Date  . Sciatic nerve exploration    . Tonsillectomy    . Esophagogastroduodenoscopy  02/28/2011    Procedure: ESOPHAGOGASTRODUODENOSCOPY (EGD);  Surgeon: Rogene Houston, MD;  Location: [facility];  Service: Endoscopy;   Laterality: N/A;  1200  . Colonoscopy  05/08/2011    Procedure: COLONOSCOPY;  Surgeon: Rogene Houston, MD;  Location: [facility];  Service: Endoscopy;  Laterality: N/A;  730  . Liver biopsy  2012  . Ankle surgery      right ankle talor repair  . Esophagogastroduodenoscopy N/A 06/11/2012    Procedure: ESOPHAGOGASTRODUODENOSCOPY (EGD);  Surgeon: Rogene Houston, MD;  Location: [facility];  Service: Endoscopy;  Laterality: N/A;  1200  FYI patient is 400 pounds  . Hernia repair Right     as child; inguinal  . Esophagogastroduodenoscopy (egd) with propofol N/A 04/21/2014    Procedure: ESOPHAGOGASTRODUODENOSCOPY (EGD) WITH PROPOFOL;  Surgeon: Rogene Houston, MD;  Location: [facility] ORS;  Service: Endoscopy;  Laterality: N/A;    Allergies  Allergen Reactions  . Oxycodone Base Itching and Nausea And Vomiting  . Prozac [Fluoxetine Hcl] Itching    Current Outpatient Prescriptions on File Prior to Visit  Medication Sig Dispense Refill  . allopurinol (ZYLOPRIM) 300 MG tablet Take 300 mg by mouth as needed (as needed for gout).     Marland Kitchen aspirin EC 81 MG tablet Take 81 mg by mouth every 6 (six) hours as needed (CHEST PAIN).    Marland Kitchen betamethasone dipropionate (DIPROLENE) 0.05 % cream APPLY TO AFFECTED AREA TWICE A DAY 45 g 3  . COLCRYS 0.6 MG tablet TAKE ONE TABLET BY MOUTH DAILY AS NEEDED (Patient taking differently: TAKE ONE TABLET BY MOUTH DAILY AS NEEDED GOUT FLARE.) 30 tablet 2  . diltiazem (CARDIZEM CD) 300 MG 24 hr capsule Take 1 capsule (300 mg total) by mouth daily. 30 capsule 11  . doxepin (SINEQUAN) 10 MG capsule Take 1 capsule (10 mg total) by mouth at bedtime. 30 capsule 1  . esomeprazole (NEXIUM) 40 MG capsule Take 1 capsule (40 mg total) by mouth daily. 30 capsule 5  . ezetimibe (ZETIA) 10 MG tablet Take 1 tablet (10 mg total) by mouth daily. 30 tablet 5  . fluticasone (FLONASE) 50 MCG/ACT nasal spray Place 2 sprays into both nostrils daily as needed. For allergies 16 g 1  . folic acid  (FOLVITE) 151 MCG tablet Take 400 mcg by mouth daily.    Marland Kitchen gabapentin (NEURONTIN) 400 MG capsule Take 400 mg by mouth 3 (three) times daily.    . hydrocortisone 1 % ointment Apply 1 application topically 3 (three) times daily as needed. 30 g 1  . HYDROmorphone (DILAUDID) 4 MG tablet Take 4 mg by mouth 4 (four) times daily as needed for severe pain.     . indomethacin (INDOCIN) 50 MG capsule     . lidocaine (XYLOCAINE) 5 % ointment Apply 1 application topically 4 (four) times daily as needed. To knees and ankle as needed for pain 35.44 g 6  . Magnesium 400 MG CAPS Take  400 mg by mouth 2 (two) times daily. 30 capsule 0  . meloxicam (MOBIC) 7.5 MG tablet Take 7.5 mg by mouth 2 (two) times daily.    . Menthol-Methyl Salicylate (MUSCLE RUB) 10-15 % CREA Apply 1 application topically as needed. For muscle pain    . milk thistle 175 MG tablet Take 175 mg by mouth 4 (four) times daily.     Marland Kitchen neomycin-bacitracin-polymyxin (NEOSPORIN) ointment Apply 1 application topically every 12 (twelve) hours as needed. For cut on ankle    . nitroGLYCERIN (NITROSTAT) 0.4 MG SL tablet Place 1 tablet (0.4 mg total) under the tongue every 5 (five) minutes as needed for chest pain (may repeat x3). 10 tablet 2  . Omega-3 Fatty Acids (FISH OIL) 1000 MG CAPS Take 1 capsule by mouth daily.     . ONE TOUCH ULTRA TEST test strip     . phenylephrine (NASAL DECONGESTANT) 1 % nasal spray Place 1 drop into the nose every 4 (four) hours as needed. For allergies    . potassium chloride SA (K-DUR,KLOR-CON) 20 MEQ tablet Take 1 tablet (20 mEq total) by mouth 2 (two) times daily. 60 tablet 6  . PROAIR HFA 108 (90 BASE) MCG/ACT inhaler INHALE 2 PUFFS THREE TIMES A DAY AS DIRECTED 8.5 g 4  . tiZANidine (ZANAFLEX) 4 MG tablet Take 4 mg by mouth every 6 (six) hours as needed for muscle spasms.    Marland Kitchen ipratropium (ATROVENT) 0.02 % nebulizer solution Take 2.5 mLs (500 mcg total) by nebulization 4 (four) times daily as needed. For wheezing (Patient  not taking: Reported on 11/16/2014) 75 mL 3  . Multiple Vitamins-Minerals (MULTI COMPLETE PO) Take 1 tablet by mouth daily.      No current facility-administered medications on file prior to visit.        Objective:   Physical ExamBlood pressure 110/60, pulse 72, temperature 97.6 F (36.4 C), height 6' (1.829 m), weight 381 lb 6.4 oz (173.002 kg).   Alert and oriented. Skin warm and dry. Oral mucosa is moist.   . Sclera anicteric, conjunctivae is pink. Thyroid not enlarged. No cervical lymphadenopathy. Lungs clear. Heart regular rate and rhythm.  Abdomen is soft. Bowel sounds are positive. No hepatomegaly. No abdominal masses felt. No tenderness. Gross ly obese. 1+ to lower extremities.        Assessment & Plan:  NAFLD, cirrhosis. Am going to get a CBC and Hepatic function. Refill on Torsemide and Zaroxolyn. OV in 6 months.

## 2014-11-16 NOTE — Telephone Encounter (Signed)
Spoke with pt, c/o difficulty with cpap making him cough since having cholecystectomy on 09/28/14.  Pt states he is not in breathing distress but is requesting recs.  Pt has not been seen in over 2 years, I advised that pt needs to make an appt.  Pt declined, saying he has no way to get here.  Pt is now requesting a referral to a pulmonologist in/near [place] that he can more easily get to.    RA please advise.  Thanks!

## 2014-11-17 ENCOUNTER — Telehealth: Payer: Self-pay | Admitting: Pulmonary Disease

## 2014-11-17 DIAGNOSIS — G4733 Obstructive sleep apnea (adult) (pediatric): Secondary | ICD-10-CM

## 2014-11-17 DIAGNOSIS — Z9989 Dependence on other enabling machines and devices: Secondary | ICD-10-CM

## 2014-11-17 LAB — CBC WITH DIFFERENTIAL/PLATELET
BASOS ABS: 0 10*3/uL (ref 0.0–0.1)
BASOS PCT: 1 % (ref 0–1)
EOS ABS: 0.1 10*3/uL (ref 0.0–0.7)
Eosinophils Relative: 3 % (ref 0–5)
HCT: 40.4 % (ref 39.0–52.0)
Hemoglobin: 13.7 g/dL (ref 13.0–17.0)
LYMPHS ABS: 1.1 10*3/uL (ref 0.7–4.0)
Lymphocytes Relative: 25 % (ref 12–46)
MCH: 27.9 pg (ref 26.0–34.0)
MCHC: 33.9 g/dL (ref 30.0–36.0)
MCV: 82.3 fL (ref 78.0–100.0)
MPV: 10.7 fL (ref 8.6–12.4)
Monocytes Absolute: 0.4 10*3/uL (ref 0.1–1.0)
Monocytes Relative: 8 % (ref 3–12)
NEUTROS PCT: 63 % (ref 43–77)
Neutro Abs: 2.8 10*3/uL (ref 1.7–7.7)
PLATELETS: 73 10*3/uL — AB (ref 150–400)
RBC: 4.91 MIL/uL (ref 4.22–5.81)
RDW: 15.6 % — ABNORMAL HIGH (ref 11.5–15.5)
WBC: 4.5 10*3/uL (ref 4.0–10.5)

## 2014-11-17 LAB — HEPATIC FUNCTION PANEL
ALT: 26 U/L (ref 9–46)
AST: 31 U/L (ref 10–35)
Albumin: 3.8 g/dL (ref 3.6–5.1)
Alkaline Phosphatase: 81 U/L (ref 40–115)
BILIRUBIN INDIRECT: 0.6 mg/dL (ref 0.2–1.2)
BILIRUBIN TOTAL: 0.8 mg/dL (ref 0.2–1.2)
Bilirubin, Direct: 0.2 mg/dL (ref <=0.2)
TOTAL PROTEIN: 7.2 g/dL (ref 6.1–8.1)

## 2014-11-17 NOTE — Telephone Encounter (Signed)
He can see PCP & get referral I already know Dr. Luan Pulling in [place]

## 2014-11-17 NOTE — Telephone Encounter (Signed)
After speaking to [place] earlier this afternoon, a referral will need to be placed for Dr. Luan Pulling in [place]. Order will be placed.

## 2014-11-17 NOTE — Telephone Encounter (Signed)
Relayed info to pt.  Pt expressed understanding. Nothing further needed.

## 2014-11-17 NOTE — Telephone Encounter (Signed)
Spoke with pt, states he picked up a lemon in his kitchen that was covered in mold.  Pt dropped the lemon and "mold flew all over the kitchen, which he breathed in".  Pt did not note any acute resp issues after this incident.  Pt last seen 09/2012.  Pt wants to know if this is dangerous to his health and if he needs to do anything about this.  I advised he throw away the moldy fruit and be sure to clean the surrounding area.  Pt requesting further recs.    RA please advise.  Thanks!

## 2014-11-18 NOTE — Telephone Encounter (Signed)
ATC pt, fast busy signal [facility]

## 2014-11-18 NOTE — Telephone Encounter (Signed)
No further recomm

## 2014-11-19 NOTE — Telephone Encounter (Signed)
Called and advised patient of Dr. Bari Mantis recommendations.  Patient was clearing his throat a lot on the phone.  I asked him if he was okay, he sounded very congested.  Patient says that he woke up this morning with a lot of mucus in his throat, he says that his CPAP machine is not blowing out and the humidifier is broken and he wakes up with mucus in his throat in the mornings.  Patient has 2 machines and both machines are broken.  He says that he has called several times but [facility] would not come to his home.  Would like [facility] to come out and check his machine.  Patient does not have transportation to take machine to them and requests that someone come to his home to check the machines.  Order entered for [facility] to check CPAP machine.  Patient notified that order has been entered for [facility] to check his CPAP machine.  Advised him that I am not sure if they will come out to his home, but I would request it.  Patient also advised that if his congestion gets worse to let us know.  Patient verbalized understanding. Nothing further needed. Closing encounter

## 2014-11-22 DIAGNOSIS — M1990 Unspecified osteoarthritis, unspecified site: Secondary | ICD-10-CM | POA: Diagnosis not present

## 2014-11-22 DIAGNOSIS — M5136 Other intervertebral disc degeneration, lumbar region: Secondary | ICD-10-CM | POA: Diagnosis not present

## 2014-11-22 DIAGNOSIS — E1140 Type 2 diabetes mellitus with diabetic neuropathy, unspecified: Secondary | ICD-10-CM | POA: Diagnosis not present

## 2014-11-22 DIAGNOSIS — M2550 Pain in unspecified joint: Secondary | ICD-10-CM | POA: Diagnosis not present

## 2014-11-27 DIAGNOSIS — M109 Gout, unspecified: Secondary | ICD-10-CM | POA: Diagnosis not present

## 2014-12-15 DIAGNOSIS — R69 Illness, unspecified: Secondary | ICD-10-CM | POA: Diagnosis not present

## 2014-12-16 ENCOUNTER — Other Ambulatory Visit: Payer: Self-pay | Admitting: Family

## 2014-12-16 ENCOUNTER — Telehealth: Payer: Self-pay | Admitting: Cardiology

## 2014-12-16 DIAGNOSIS — I25709 Atherosclerosis of coronary artery bypass graft(s), unspecified, with unspecified angina pectoris: Secondary | ICD-10-CM

## 2014-12-16 NOTE — Telephone Encounter (Signed)
Pt no longer has a primary care doctor. He is on Zetia,wants to know if he needs to continue taking it?If he does,he needs a new prescription.

## 2014-12-16 NOTE — Telephone Encounter (Signed)
Last lipid panel in seen October 2015, patient does not know if he has had since then Patient has been on Zetia for quite some time and was taking at last labs  If Dr Percival Spanish wants for him to continue would like Rx sent to pharmacy  Will forward to Dr Percival Spanish for review

## 2014-12-17 NOTE — Telephone Encounter (Signed)
Please renew Zetia

## 2014-12-21 MED ORDER — EZETIMIBE 10 MG PO TABS: 10.0000 mg | ORAL_TABLET | Freq: Every day | ORAL | Status: DC

## 2014-12-21 NOTE — Telephone Encounter (Signed)
Left message for pt on private voicemail rx sent into pharmacy.

## 2014-12-21 NOTE — Telephone Encounter (Signed)
Zetia renewed-RX sent into pharmacy as requested.

## 2015-01-01 DIAGNOSIS — R002 Palpitations: Secondary | ICD-10-CM | POA: Diagnosis not present

## 2015-01-01 DIAGNOSIS — G4730 Sleep apnea, unspecified: Secondary | ICD-10-CM | POA: Diagnosis not present

## 2015-01-01 DIAGNOSIS — I480 Paroxysmal atrial fibrillation: Secondary | ICD-10-CM | POA: Diagnosis not present

## 2015-01-01 DIAGNOSIS — R51 Headache: Secondary | ICD-10-CM | POA: Diagnosis not present

## 2015-01-01 DIAGNOSIS — Z6841 Body Mass Index (BMI) 40.0 and over, adult: Secondary | ICD-10-CM | POA: Diagnosis not present

## 2015-01-01 DIAGNOSIS — R0789 Other chest pain: Secondary | ICD-10-CM | POA: Diagnosis not present

## 2015-01-01 DIAGNOSIS — R112 Nausea with vomiting, unspecified: Secondary | ICD-10-CM | POA: Diagnosis not present

## 2015-01-01 DIAGNOSIS — R079 Chest pain, unspecified: Secondary | ICD-10-CM | POA: Diagnosis not present

## 2015-01-01 DIAGNOSIS — M79602 Pain in left arm: Secondary | ICD-10-CM | POA: Diagnosis not present

## 2015-01-01 DIAGNOSIS — R0602 Shortness of breath: Secondary | ICD-10-CM | POA: Diagnosis not present

## 2015-01-01 DIAGNOSIS — R531 Weakness: Secondary | ICD-10-CM | POA: Diagnosis not present

## 2015-01-01 DIAGNOSIS — E119 Type 2 diabetes mellitus without complications: Secondary | ICD-10-CM | POA: Diagnosis not present

## 2015-01-01 DIAGNOSIS — Z885 Allergy status to narcotic agent status: Secondary | ICD-10-CM | POA: Diagnosis not present

## 2015-01-01 DIAGNOSIS — Z79891 Long term (current) use of opiate analgesic: Secondary | ICD-10-CM | POA: Diagnosis not present

## 2015-01-02 DIAGNOSIS — R079 Chest pain, unspecified: Secondary | ICD-10-CM | POA: Diagnosis not present

## 2015-01-24 ENCOUNTER — Other Ambulatory Visit: Payer: Self-pay | Admitting: Family

## 2015-01-25 ENCOUNTER — Other Ambulatory Visit (INDEPENDENT_AMBULATORY_CARE_PROVIDER_SITE_OTHER): Payer: Self-pay | Admitting: Internal Medicine

## 2015-02-23 ENCOUNTER — Other Ambulatory Visit: Payer: Self-pay | Admitting: Cardiology

## 2015-02-23 NOTE — Telephone Encounter (Signed)
Rx request sent to pharmacy.  

## 2015-03-02 DIAGNOSIS — Z79899 Other long term (current) drug therapy: Secondary | ICD-10-CM | POA: Diagnosis not present

## 2015-03-02 DIAGNOSIS — Z9889 Other specified postprocedural states: Secondary | ICD-10-CM | POA: Diagnosis not present

## 2015-03-02 DIAGNOSIS — R1900 Intra-abdominal and pelvic swelling, mass and lump, unspecified site: Secondary | ICD-10-CM | POA: Insufficient documentation

## 2015-03-02 DIAGNOSIS — Z7982 Long term (current) use of aspirin: Secondary | ICD-10-CM | POA: Diagnosis not present

## 2015-03-02 DIAGNOSIS — K828 Other specified diseases of gallbladder: Secondary | ICD-10-CM | POA: Diagnosis not present

## 2015-03-02 DIAGNOSIS — Z9049 Acquired absence of other specified parts of digestive tract: Secondary | ICD-10-CM | POA: Diagnosis not present

## 2015-03-02 DIAGNOSIS — K7460 Unspecified cirrhosis of liver: Secondary | ICD-10-CM | POA: Diagnosis not present

## 2015-03-02 DIAGNOSIS — I509 Heart failure, unspecified: Secondary | ICD-10-CM | POA: Diagnosis not present

## 2015-03-02 DIAGNOSIS — J449 Chronic obstructive pulmonary disease, unspecified: Secondary | ICD-10-CM | POA: Diagnosis not present

## 2015-03-21 DIAGNOSIS — J449 Chronic obstructive pulmonary disease, unspecified: Secondary | ICD-10-CM | POA: Diagnosis not present

## 2015-03-21 DIAGNOSIS — I509 Heart failure, unspecified: Secondary | ICD-10-CM | POA: Diagnosis not present

## 2015-03-21 DIAGNOSIS — R1900 Intra-abdominal and pelvic swelling, mass and lump, unspecified site: Secondary | ICD-10-CM | POA: Diagnosis not present

## 2015-03-21 DIAGNOSIS — K429 Umbilical hernia without obstruction or gangrene: Secondary | ICD-10-CM | POA: Diagnosis not present

## 2015-03-21 DIAGNOSIS — R05 Cough: Secondary | ICD-10-CM | POA: Diagnosis not present

## 2015-03-24 ENCOUNTER — Telehealth (INDEPENDENT_AMBULATORY_CARE_PROVIDER_SITE_OTHER): Payer: Self-pay | Admitting: Internal Medicine

## 2015-03-24 NOTE — Telephone Encounter (Signed)
Mr. Demarest called saying he was contacted by another provider and told he has portal vein thrombosis due to a hemorrhoid possibly. He's wondering if he needs an appt with Terri sooner than his scheduled May appt. Please call the pt regarding this.  Pt's ph# [phone] Thank you.

## 2015-03-28 NOTE — Telephone Encounter (Signed)
I have talked with patient and do not seen any xrays or MRI stating this.

## 2015-03-28 NOTE — Telephone Encounter (Signed)
Message left on cell phone.

## 2015-03-30 ENCOUNTER — Telehealth (INDEPENDENT_AMBULATORY_CARE_PROVIDER_SITE_OTHER): Payer: Self-pay | Admitting: Internal Medicine

## 2015-03-30 NOTE — Telephone Encounter (Signed)
error 

## 2015-03-31 ENCOUNTER — Telehealth (INDEPENDENT_AMBULATORY_CARE_PROVIDER_SITE_OTHER): Payer: Self-pay | Admitting: Internal Medicine

## 2015-03-31 DIAGNOSIS — K7460 Unspecified cirrhosis of liver: Secondary | ICD-10-CM

## 2015-03-31 NOTE — Telephone Encounter (Signed)
Needs US abdomen

## 2015-03-31 NOTE — Telephone Encounter (Signed)
[place] sch'd 04/06/15, patient aware

## 2015-04-01 ENCOUNTER — Telehealth (INDEPENDENT_AMBULATORY_CARE_PROVIDER_SITE_OTHER): Payer: Self-pay | Admitting: Internal Medicine

## 2015-04-01 DIAGNOSIS — I8290 Acute embolism and thrombosis of unspecified vein: Secondary | ICD-10-CM

## 2015-04-01 MED ORDER — WARFARIN SODIUM 2.5 MG PO TABS: 2.5000 mg | ORAL_TABLET | Freq: Every day | ORAL | Status: DC

## 2015-04-01 NOTE — Telephone Encounter (Signed)
Rx sent to his pharmacy. I have spoke with patient.  PT/INR  04/11/2015

## 2015-04-02 DIAGNOSIS — R030 Elevated blood-pressure reading, without diagnosis of hypertension: Secondary | ICD-10-CM | POA: Diagnosis not present

## 2015-04-02 DIAGNOSIS — M109 Gout, unspecified: Secondary | ICD-10-CM | POA: Diagnosis not present

## 2015-04-02 DIAGNOSIS — S29011A Strain of muscle and tendon of front wall of thorax, initial encounter: Secondary | ICD-10-CM | POA: Diagnosis not present

## 2015-04-03 DIAGNOSIS — M62830 Muscle spasm of back: Secondary | ICD-10-CM | POA: Diagnosis not present

## 2015-04-06 ENCOUNTER — Ambulatory Visit (HOSPITAL_COMMUNITY): Payer: Medicare HMO

## 2015-04-06 ENCOUNTER — Telehealth (INDEPENDENT_AMBULATORY_CARE_PROVIDER_SITE_OTHER): Payer: Self-pay | Admitting: Internal Medicine

## 2015-04-06 NOTE — Telephone Encounter (Signed)
Please tell [name] he can take Vitamin D.

## 2015-04-06 NOTE — Telephone Encounter (Signed)
[name] left a message asking if he should take Vitamin D since he's on Coumadin. He'd like a phone call regarding this.  Pt's ph# [phone]  Thank you.

## 2015-04-06 NOTE — Telephone Encounter (Signed)
[name] has been made aware of this. Thank you.

## 2015-04-07 ENCOUNTER — Encounter (INDEPENDENT_AMBULATORY_CARE_PROVIDER_SITE_OTHER): Payer: Self-pay

## 2015-04-07 DIAGNOSIS — M25551 Pain in right hip: Secondary | ICD-10-CM | POA: Diagnosis not present

## 2015-04-07 DIAGNOSIS — M109 Gout, unspecified: Secondary | ICD-10-CM | POA: Diagnosis not present

## 2015-04-07 DIAGNOSIS — M179 Osteoarthritis of knee, unspecified: Secondary | ICD-10-CM | POA: Diagnosis not present

## 2015-04-07 DIAGNOSIS — M25552 Pain in left hip: Secondary | ICD-10-CM | POA: Diagnosis not present

## 2015-04-07 DIAGNOSIS — R030 Elevated blood-pressure reading, without diagnosis of hypertension: Secondary | ICD-10-CM | POA: Diagnosis not present

## 2015-04-07 DIAGNOSIS — M1712 Unilateral primary osteoarthritis, left knee: Secondary | ICD-10-CM | POA: Diagnosis not present

## 2015-04-07 DIAGNOSIS — Z6841 Body Mass Index (BMI) 40.0 and over, adult: Secondary | ICD-10-CM | POA: Diagnosis not present

## 2015-04-07 DIAGNOSIS — J449 Chronic obstructive pulmonary disease, unspecified: Secondary | ICD-10-CM | POA: Diagnosis not present

## 2015-04-07 DIAGNOSIS — Z23 Encounter for immunization: Secondary | ICD-10-CM | POA: Diagnosis not present

## 2015-04-07 DIAGNOSIS — M79606 Pain in leg, unspecified: Secondary | ICD-10-CM | POA: Diagnosis not present

## 2015-04-07 DIAGNOSIS — I81 Portal vein thrombosis: Secondary | ICD-10-CM | POA: Diagnosis not present

## 2015-04-07 DIAGNOSIS — M25559 Pain in unspecified hip: Secondary | ICD-10-CM | POA: Diagnosis not present

## 2015-04-07 DIAGNOSIS — G629 Polyneuropathy, unspecified: Secondary | ICD-10-CM | POA: Diagnosis not present

## 2015-04-07 DIAGNOSIS — I10 Essential (primary) hypertension: Secondary | ICD-10-CM | POA: Diagnosis not present

## 2015-04-07 DIAGNOSIS — G4733 Obstructive sleep apnea (adult) (pediatric): Secondary | ICD-10-CM | POA: Diagnosis not present

## 2015-04-07 DIAGNOSIS — K219 Gastro-esophageal reflux disease without esophagitis: Secondary | ICD-10-CM | POA: Diagnosis not present

## 2015-04-07 DIAGNOSIS — M160 Bilateral primary osteoarthritis of hip: Secondary | ICD-10-CM | POA: Diagnosis not present

## 2015-04-08 DIAGNOSIS — M1000 Idiopathic gout, unspecified site: Secondary | ICD-10-CM | POA: Diagnosis not present

## 2015-04-08 DIAGNOSIS — M179 Osteoarthritis of knee, unspecified: Secondary | ICD-10-CM | POA: Diagnosis not present

## 2015-04-09 DIAGNOSIS — M109 Gout, unspecified: Secondary | ICD-10-CM | POA: Diagnosis not present

## 2015-04-10 DIAGNOSIS — M109 Gout, unspecified: Secondary | ICD-10-CM | POA: Diagnosis not present

## 2015-04-12 ENCOUNTER — Telehealth (INDEPENDENT_AMBULATORY_CARE_PROVIDER_SITE_OTHER): Payer: Self-pay | Admitting: *Deleted

## 2015-04-12 NOTE — Telephone Encounter (Signed)
[name] has called in--been in hospital 1 1/2 weeks and has question since he had been on lots of steroids and wants to know if he can start Warfarin.  Please call patient?  [phone]  I have called patient and asked for return call

## 2015-04-13 ENCOUNTER — Ambulatory Visit (HOSPITAL_COMMUNITY)
Admission: RE | Admit: 2015-04-13 | Discharge: 2015-04-13 | Disposition: A | Discharge status: Home / Self Care | Payer: Medicare HMO | Source: Ambulatory Visit | Attending: Internal Medicine | Admitting: Internal Medicine

## 2015-04-13 DIAGNOSIS — K7460 Unspecified cirrhosis of liver: Secondary | ICD-10-CM

## 2015-04-13 DIAGNOSIS — Z9049 Acquired absence of other specified parts of digestive tract: Secondary | ICD-10-CM | POA: Insufficient documentation

## 2015-04-13 DIAGNOSIS — R161 Splenomegaly, not elsewhere classified: Secondary | ICD-10-CM | POA: Insufficient documentation

## 2015-04-15 ENCOUNTER — Telehealth (INDEPENDENT_AMBULATORY_CARE_PROVIDER_SITE_OTHER): Payer: Self-pay | Admitting: Internal Medicine

## 2015-04-15 DIAGNOSIS — I8290 Acute embolism and thrombosis of unspecified vein: Secondary | ICD-10-CM

## 2015-04-15 NOTE — Telephone Encounter (Signed)
[place] ruq ordered portal vein thrombus.

## 2015-04-18 DIAGNOSIS — G8929 Other chronic pain: Secondary | ICD-10-CM | POA: Diagnosis not present

## 2015-04-18 DIAGNOSIS — M1712 Unilateral primary osteoarthritis, left knee: Secondary | ICD-10-CM | POA: Diagnosis not present

## 2015-04-18 DIAGNOSIS — M545 Low back pain: Secondary | ICD-10-CM | POA: Diagnosis not present

## 2015-04-18 DIAGNOSIS — M25562 Pain in left knee: Secondary | ICD-10-CM | POA: Diagnosis not present

## 2015-04-25 ENCOUNTER — Other Ambulatory Visit: Payer: Self-pay | Admitting: *Deleted

## 2015-04-25 DIAGNOSIS — M47816 Spondylosis without myelopathy or radiculopathy, lumbar region: Secondary | ICD-10-CM | POA: Diagnosis not present

## 2015-04-25 MED ORDER — DILTIAZEM HCL ER COATED BEADS 300 MG PO CP24: 300.0000 mg | ORAL_CAPSULE | Freq: Every day | ORAL | Status: DC

## 2015-04-26 ENCOUNTER — Telehealth (INDEPENDENT_AMBULATORY_CARE_PROVIDER_SITE_OTHER): Payer: Self-pay | Admitting: Internal Medicine

## 2015-04-26 NOTE — Telephone Encounter (Signed)
Message left

## 2015-04-26 NOTE — Telephone Encounter (Signed)
Patient called, would like to discuss surgery and go over ultrasound results.  [place] registered nurse at [place] works for Dr. Florene Glen, wants to go over CT conclusion they have there.  [place]

## 2015-04-26 NOTE — Telephone Encounter (Signed)
Needs to get [place] scheduled.

## 2015-05-12 ENCOUNTER — Other Ambulatory Visit (INDEPENDENT_AMBULATORY_CARE_PROVIDER_SITE_OTHER): Payer: Self-pay | Admitting: Internal Medicine

## 2015-05-12 ENCOUNTER — Ambulatory Visit (HOSPITAL_COMMUNITY)
Admission: RE | Admit: 2015-05-12 | Discharge: 2015-05-12 | Disposition: A | Discharge status: Home / Self Care | Payer: Medicare HMO | Source: Ambulatory Visit | Attending: Internal Medicine | Admitting: Internal Medicine

## 2015-05-12 DIAGNOSIS — I8290 Acute embolism and thrombosis of unspecified vein: Secondary | ICD-10-CM

## 2015-05-12 DIAGNOSIS — I820 Budd-Chiari syndrome: Secondary | ICD-10-CM | POA: Diagnosis not present

## 2015-05-12 DIAGNOSIS — I81 Portal vein thrombosis: Secondary | ICD-10-CM | POA: Insufficient documentation

## 2015-05-17 ENCOUNTER — Telehealth (INDEPENDENT_AMBULATORY_CARE_PROVIDER_SITE_OTHER): Payer: Self-pay | Admitting: Internal Medicine

## 2015-05-17 ENCOUNTER — Telehealth: Payer: Self-pay | Admitting: Pulmonary Disease

## 2015-05-17 DIAGNOSIS — J441 Chronic obstructive pulmonary disease with (acute) exacerbation: Secondary | ICD-10-CM | POA: Diagnosis not present

## 2015-05-17 NOTE — Telephone Encounter (Signed)
lmtcb x1 for pt. 

## 2015-05-17 NOTE — Telephone Encounter (Signed)
Patient called, concerned and waiting for results.  He'd like a call back.  [phone]

## 2015-05-18 NOTE — Telephone Encounter (Signed)
I have talked with patient. See [place] report

## 2015-05-20 ENCOUNTER — Telehealth: Payer: Self-pay | Admitting: Pulmonary Disease

## 2015-05-20 NOTE — Telephone Encounter (Signed)
Spoke with pt. States that he is having issues with SOB. 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 Pt want [place] to write a letter stating that he can't be out in the heat for extended periods of time. Advised him that we have not seen him since 2014 and he would need to make an appointment before we could write such a letter. He declined at this time to make an appointment due to transportation. He will call back if he changes his mind. Nothing further was needed.

## 2015-06-01 ENCOUNTER — Ambulatory Visit (INDEPENDENT_AMBULATORY_CARE_PROVIDER_SITE_OTHER): Payer: Medicare HMO | Admitting: Internal Medicine

## 2015-06-01 ENCOUNTER — Encounter (INDEPENDENT_AMBULATORY_CARE_PROVIDER_SITE_OTHER): Payer: Self-pay | Admitting: Internal Medicine

## 2015-06-01 ENCOUNTER — Telehealth: Payer: Self-pay | Admitting: Cardiology

## 2015-06-01 VITALS — BP 180/100 | HR 76 | Temp 97.6°F | Ht 72.0 in | Wt 380.7 lb

## 2015-06-01 DIAGNOSIS — K7460 Unspecified cirrhosis of liver: Secondary | ICD-10-CM

## 2015-06-01 DIAGNOSIS — R69 Illness, unspecified: Secondary | ICD-10-CM | POA: Diagnosis not present

## 2015-06-01 DIAGNOSIS — R600 Localized edema: Secondary | ICD-10-CM

## 2015-06-01 DIAGNOSIS — M5441 Lumbago with sciatica, right side: Secondary | ICD-10-CM

## 2015-06-01 DIAGNOSIS — K219 Gastro-esophageal reflux disease without esophagitis: Secondary | ICD-10-CM | POA: Diagnosis not present

## 2015-06-01 DIAGNOSIS — K7030 Alcoholic cirrhosis of liver without ascites: Secondary | ICD-10-CM | POA: Diagnosis not present

## 2015-06-01 MED ORDER — HYDROMORPHONE HCL 4 MG PO TABS: 4.0000 mg | ORAL_TABLET | Freq: Four times a day (QID) | ORAL | Status: DC | PRN

## 2015-06-01 MED ORDER — LACTULOSE 10 GM/15ML PO SOLN: 20.0000 g | Freq: Two times a day (BID) | ORAL | Status: DC

## 2015-06-01 MED ORDER — ESOMEPRAZOLE MAGNESIUM 40 MG PO CPDR: 40.0000 mg | DELAYED_RELEASE_CAPSULE | Freq: Every day | ORAL | Status: DC

## 2015-06-01 NOTE — Telephone Encounter (Signed)
No PRN dilt.  Schedule follow up.

## 2015-06-01 NOTE — Progress Notes (Signed)
Subjective:    Patient ID: [name], male    DOB: 04/10/1965, 50 y.o.   MRN: [id number]  HPI  Here today for f/u of his cirrhosis secondary to alcohol and possible NAFLD.  He was last seen in November, 2016 by me . He tells me he is doing pretty good. He says he stopped the Warfarin a couple of weeks ago because he was started on steroids and an antibiotic for bronchitis. He is back on the Warfarin now. Appetite is good. His weight has remained the same.  He does not drive. He c/o arthritis in his left knee He denies any ascites. No periods of confusion Auto immune process ruled out for his liver.  Recently started on Warfarin for near occlusive portal vein thrombosis with signs of portal hypertension and developing shunts [facility]) on CT scan at [facility]. Repeat US of the abdomen in May of this year  revealed: significant amount of nonocclusive thrombus in the main portal vein near the liver. Some flow is detected in this segment of the main portal vein. Intrahepatic portal vein branches appear to be normally patent.   GERD is not controlled at this time. He has been off Nexium. Hx of paroxysmal atrial fib.  09/28/2014 Liver biopsy: (following cholecystectomy) Cirrhosis, Minimal macrovesicular steatosis. Mild portal inflammation. No evidence of active hepatitis, Mallory's hyaline, cholestasis, bile duct abnormalities.     05/08/2011 Colonoscopy with snare polypectomy and Hemoclip application.  Impression:  Examination performed to cecum. Two small polyps ablated via cold biopsy and submitted together(ascending and transverse colon). 6 mm and 10 mm polyp snared from proximal sigmoid colon and submitted together. One Hemoclip applied to each site to reduce the risk of post-polypectomy bleed given he has thrombocytopenia and portal hypertension. Mild sigmoid colon diverticulosis. Internal hemorrhoids  Three polyps were tubular adenomas. Colonoscopy in 5 years. Office visit in 6  months re liver problems.     05/12/2015:  [place] RUQ: ? Thrombus IMPRESSION: Significant amount of nonocclusive thrombus in the main portal vein near the liver. Some flow is detected in this segment of the main portal vein. Intrahepatic portal vein branches appear to be normally patent.  04/21/2014 EGD  Indications: Patient is 50 year old Caucasian male was advanced cirrhosis and is undergoing EGD with possible esophageal variceal banding. Last EGD was in June 2014 and he was found to  Impression: Two short columns of grade 1 esophageal varices. Portal gastropathy with antral nodules. Biopsy of these nodules in February 2013 revealed changes of portal hypertension. Speck blood noted just below the Z line secondary to portal gastropathy.have grade 1 esophageal varices.   Review of Systems Past Medical History  Diagnosis Date  . Gout   . Diabetes mellitus     type 2  . RA (rheumatoid arthritis) ([place])   . COPD (chronic obstructive pulmonary disease) ([place])     with cpap  . Atrial fibrillation ([place])   . Cirrhosis ([place])   . Anxiety   . Heart murmur   . CHF (congestive heart failure) ([place])   . Constipation   . Sleep apnea     cpap used- level 10 and greater  . Neuromuscular disorder (HCC)     neuropathy in hands and feet  . Cirrhosis ([place])     NASH  . Thrombocytopenia ([place])   . Genu varus   . Psoriasis   . GERD (gastroesophageal reflux disease)     Past Surgical History  Procedure Laterality Date  . Sciatic nerve exploration    .  Tonsillectomy    . Esophagogastroduodenoscopy  02/28/2011    Procedure: ESOPHAGOGASTRODUODENOSCOPY (EGD);  Surgeon: Rogene Houston, MD;  [facility];  Service: Endoscopy;  Laterality: N/A;  1200  . Colonoscopy  05/08/2011    Procedure: COLONOSCOPY;  Surgeon: Rogene Houston, MD;  [facility];  Service: Endoscopy;  Laterality: N/A;  730  . Liver biopsy  2012  . Ankle surgery      right ankle talor repair  .  Esophagogastroduodenoscopy N/A 06/11/2012    Procedure: ESOPHAGOGASTRODUODENOSCOPY (EGD);  Surgeon: Rogene Houston, MD;  [facility];  Service: Endoscopy;  Laterality: N/A;  1200  FYI patient is 400 pounds  . Hernia repair Right     as child; inguinal  . Esophagogastroduodenoscopy (egd) with propofol N/A 04/21/2014    Procedure: ESOPHAGOGASTRODUODENOSCOPY (EGD) WITH PROPOFOL;  Surgeon: Rogene Houston, MD;  Location: [facility];  Service: Endoscopy;  Laterality: N/A;    Allergies  Allergen Reactions  . Oxycodone Base Itching and Nausea And Vomiting  . Prozac [Fluoxetine Hcl] Itching    Current Outpatient Prescriptions on File Prior to Visit  Medication Sig Dispense Refill  . allopurinol (ZYLOPRIM) 300 MG tablet Take 300 mg by mouth as needed (as needed for gout).     Marland Kitchen aspirin EC 81 MG tablet Take 81 mg by mouth every 6 (six) hours as needed (CHEST PAIN).    Marland Kitchen betamethasone dipropionate (DIPROLENE) 0.05 % cream APPLY TO AFFECTED AREA TWICE A DAY 45 g 3  . COLCRYS 0.6 MG tablet TAKE ONE TABLET BY MOUTH DAILY AS NEEDED (Patient taking differently: TAKE ONE TABLET BY MOUTH DAILY AS NEEDED GOUT FLARE.) 30 tablet 2  . diltiazem (CARDIZEM CD) 300 MG 24 hr capsule Take 1 capsule (300 mg total) by mouth daily. NEED OV. 30 capsule 1  . esomeprazole (NEXIUM) 40 MG capsule Take 1 capsule (40 mg total) by mouth daily. 30 capsule 5  . ezetimibe (ZETIA) 10 MG tablet Take 1 tablet (10 mg total) by mouth daily. 30 tablet 11  . fluticasone (FLONASE) 50 MCG/ACT nasal spray Place 2 sprays into both nostrils daily as needed. For allergies 16 g 1  . folic acid (FOLVITE) 106 MCG tablet Take 400 mcg by mouth daily.    Marland Kitchen gabapentin (NEURONTIN) 400 MG capsule Take 400 mg by mouth 3 (three) times daily.    . hydrocortisone 1 % ointment Apply 1 application topically 3 (three) times daily as needed. 30 g 1  . HYDROmorphone (DILAUDID) 4 MG tablet Take 4 mg by mouth 4 (four) times daily as needed for severe pain.      Marland Kitchen ipratropium (ATROVENT) 0.02 % nebulizer solution Take 2.5 mLs (500 mcg total) by nebulization 4 (four) times daily as needed. For wheezing 75 mL 3  . lactulose (CHRONULAC) 10 GM/15ML solution Take 30 mLs (20 g total) by mouth 2 (two) times daily. 1892 mL 5  . lidocaine (XYLOCAINE) 5 % ointment Apply 1 application topically 4 (four) times daily as needed. To knees and ankle as needed for pain 35.44 g 6  . Magnesium 400 MG CAPS Take 400 mg by mouth 2 (two) times daily. (Patient taking differently: Take 400 mg by mouth every morning. ) 30 capsule 0  . meloxicam (MOBIC) 7.5 MG tablet Take 7.5 mg by mouth 2 (two) times daily.    . Menthol-Methyl Salicylate (MUSCLE RUB) 10-15 % CREA Apply 1 application topically as needed. For muscle pain    . metolazone (  ZAROXOLYN) 2.5 MG tablet TAKE 1 TABLET DAILY 1 1/2 HOUR PRIOR TO TORSEMIDE FOR 3 DAYS AS NEEDED FOR SEVERE SWELLING 30 tablet 0  . milk thistle 175 MG tablet Take 175 mg by mouth 4 (four) times daily.     . Multiple Vitamins-Minerals (MULTI COMPLETE PO) Take 1 tablet by mouth daily.     . nitroGLYCERIN (NITROSTAT) 0.4 MG SL tablet Place 1 tablet (0.4 mg total) under the tongue every 5 (five) minutes as needed for chest pain (may repeat x3). 10 tablet 2  . Omega-3 Fatty Acids (FISH OIL) 1000 MG CAPS Take 1 capsule by mouth daily.     . phenylephrine (NASAL DECONGESTANT) 1 % nasal spray Place 1 drop into the nose every 4 (four) hours as needed. For allergies    . potassium chloride SA (K-DUR,KLOR-CON) 20 MEQ tablet Take 1 tablet (20 mEq total) by mouth 2 (two) times daily. 60 tablet 6  . PROAIR HFA 108 (90 BASE) MCG/ACT inhaler INHALE 2 PUFFS THREE TIMES A DAY AS DIRECTED 8.5 g 4  . torsemide (DEMADEX) 20 MG tablet Take 1 tablet (20 mg total) by mouth 3 (three) times daily. 90 tablet 4  . warfarin (COUMADIN) 2.5 MG tablet Take 1 tablet (2.5 mg total) by mouth daily. 30 tablet 3  . neomycin-bacitracin-polymyxin (NEOSPORIN) ointment Apply 1 application  topically every 12 (twelve) hours as needed. Reported on 06/01/2015     No current facility-administered medications on file prior to visit.        Objective:   Physical Exam Blood pressure 180/100, pulse 76, temperature 97.6 F (36.4 C), height 6' (1.829 m), weight 380 lb 11.2 oz (172.684 kg).  Alert and oriented. Skin warm and dry. Oral mucosa is moist.   . Sclera anicteric, conjunctivae is pink. Thyroid not enlarged. No cervical lymphadenopathy. Lungs clear. Heart regular rate and rhythm.  Abdomen is soft. Bowel sounds are positive. No hepatomegaly. No abdominal masses felt. No tenderness. 2+ edema to lower extremities.         Assessment & Plan:  Cirrhosis/NAFLD. He seems to be doing well. Really exercising due to his knee and back pain. Will get PT/INR, CBC, Hepatic function,ammonia. Mortimer Fries called office after leaving and stated he did not have blood work drawn. Lab wanted money up front.) Back and knee pain: Patient request refill on his Dilaudid. I will give him Dilaudid 73m  (30). I will not refill this. He will need to call his PCP for this. OV in 6 months.

## 2015-06-01 NOTE — Telephone Encounter (Signed)
Pt aware no prn Dilt.  Scheduled appt for f/u 5/31 at 9:45 in [place].

## 2015-06-01 NOTE — Telephone Encounter (Signed)
New message   Pt c/o medication issue:  1. Name of Medication: Diltiazem   2. How are you currently taking this medication (dosage and times per day)? 300 mg po once daily but pt states the Md said the pt can take 90 mg inbetweeen the 300 mg dose as well  3. Are you having a reaction (difficulty breathing--STAT)? no  4. What is your medication issue? The pt is calling for the MD to send in a letter stating it is ok for the pt to take the Diltiazem 90 mg po as needed and needs to medication called into the drug store and the pt states the letter needs to states how important it is to take the medication.    *STAT* If patient is at the pharmacy, call can be transferred to refill team.   1. Which medications need to be refilled? (please list name of each medication and dose if known) Diltiazem 90 mg as needed  2. Which pharmacy/location (including street and city if local pharmacy) is medication to be sent to? [facility] in [place], [place]  3. Do they need a 30 day or 90 day supply? 30 day

## 2015-06-01 NOTE — Telephone Encounter (Signed)
Pt due to be seen in office for yearly f/u. Attempted to return patient call. No answer. Regarding his inquiry, I can't find any instruction given regarding the PRN diltiazem.  Pt listed as taking the 366m daily only.  Routed to Dr. HPercival Spanishfor clarification.

## 2015-06-01 NOTE — Patient Instructions (Signed)
Labs today. OV in 6 months.

## 2015-06-07 ENCOUNTER — Telehealth (INDEPENDENT_AMBULATORY_CARE_PROVIDER_SITE_OTHER): Payer: Self-pay | Admitting: Internal Medicine

## 2015-06-07 NOTE — Telephone Encounter (Signed)
I advised him he would need his PCP to do the referral. Patient is at a high risk for surgery and I advised him of this. He will see his cardiologist tomorrow and have PT/INR drawn and send to our office

## 2015-06-07 NOTE — Telephone Encounter (Signed)
Patient called, would like for Terri to refer him to Dr. Arnoldo Morale for his hernia.  He would like to speak to [place].  [phone]

## 2015-06-08 ENCOUNTER — Encounter: Payer: Self-pay | Admitting: Cardiology

## 2015-06-08 ENCOUNTER — Ambulatory Visit (INDEPENDENT_AMBULATORY_CARE_PROVIDER_SITE_OTHER): Payer: Medicare HMO | Admitting: Cardiology

## 2015-06-08 VITALS — BP 119/83 | HR 82 | Ht 72.0 in | Wt 376.0 lb

## 2015-06-08 DIAGNOSIS — I81 Portal vein thrombosis: Secondary | ICD-10-CM | POA: Diagnosis not present

## 2015-06-08 DIAGNOSIS — K429 Umbilical hernia without obstruction or gangrene: Secondary | ICD-10-CM

## 2015-06-08 DIAGNOSIS — I5032 Chronic diastolic (congestive) heart failure: Secondary | ICD-10-CM | POA: Diagnosis not present

## 2015-06-08 MED ORDER — DILTIAZEM HCL ER 90 MG PO CP12: 90.0000 mg | ORAL_CAPSULE | Freq: Every day | ORAL | Status: DC | PRN

## 2015-06-08 MED ORDER — NITROGLYCERIN 0.4 MG SL SUBL: 0.4000 mg | SUBLINGUAL_TABLET | SUBLINGUAL | Status: DC | PRN

## 2015-06-08 NOTE — Progress Notes (Signed)
HPI The patient returns for follow up of diastolic heart failure and a history of atrial fibrillation.   Since I last saw him he has been hospitalized twice.  I was able to retrieve results from [facility] and to review notes from his GI MD.  However, I have not been able review the results from his [facility] hospitalization.  In September at [facility] he had a laparoscopic cholecystectomy and umbilical hernia repair. He does have an enlarging umbilical hernia still is anxious to have this fixed. Of note with CT of his abdomen fairly recently he was noted to have an incidental finding of a near occlusive portal vein thrombosis with signs of portal hypertension and developing shunts. This was described in March 2017.  Of note is not entirely clear to me who is following his Coumadin. He seems to stop the medication at times and in particular mentions that he doesn't take it when he has gout. He reports that he was in more 2-3 months ago. Not entirely sure what this was for. He comes for follow-up of his atrial fibrillation. He still has bouts of this. He has been taking an extra diltiazem 90 mg when this happens he says this helps with his symptoms. He's actually lost about 50 pounds. He denies any new shortness of breath, PND or orthopnea. Denies any chest pressure, neck or arm discomfort. He has back and joint pains.  Allergies  Allergen Reactions  . Oxycodone Base Itching and Nausea And Vomiting  . Prozac [Fluoxetine Hcl] Itching    Current Outpatient Prescriptions  Medication Sig Dispense Refill  . allopurinol (ZYLOPRIM) 300 MG tablet Take 300 mg by mouth as needed (as needed for gout).     Marland Kitchen aspirin EC 81 MG tablet Take 81 mg by mouth every 6 (six) hours as needed (CHEST PAIN).    Marland Kitchen betamethasone dipropionate (DIPROLENE) 0.05 % cream APPLY TO AFFECTED AREA TWICE A DAY 45 g 3  . COLCRYS 0.6 MG tablet TAKE ONE TABLET BY MOUTH DAILY AS NEEDED (Patient taking differently: TAKE ONE  TABLET BY MOUTH DAILY AS NEEDED GOUT FLARE.) 30 tablet 2  . diltiazem (CARDIZEM CD) 300 MG 24 hr capsule Take 1 capsule (300 mg total) by mouth daily. NEED OV. 30 capsule 1  . esomeprazole (NEXIUM) 40 MG capsule Take 1 capsule (40 mg total) by mouth daily. 30 capsule 5  . ezetimibe (ZETIA) 10 MG tablet Take 1 tablet (10 mg total) by mouth daily. 30 tablet 11  . fluticasone (FLONASE) 50 MCG/ACT nasal spray Place 2 sprays into both nostrils daily as needed. For allergies 16 g 1  . folic acid (FOLVITE) 950 MCG tablet Take 400 mcg by mouth daily.    Marland Kitchen gabapentin (NEURONTIN) 400 MG capsule Take 400 mg by mouth 3 (three) times daily.    . hydrocortisone 1 % ointment Apply 1 application topically 3 (three) times daily as needed. 30 g 1  . HYDROmorphone (DILAUDID) 4 MG tablet Take 1 tablet (4 mg total) by mouth 4 (four) times daily as needed for severe pain. 30 tablet 0  . ipratropium (ATROVENT) 0.02 % nebulizer solution Take 2.5 mLs (500 mcg total) by nebulization 4 (four) times daily as needed. For wheezing 75 mL 3  . lactulose (CHRONULAC) 10 GM/15ML solution Take 30 mLs (20 g total) by mouth 2 (two) times daily. 1892 mL 5  . lidocaine (XYLOCAINE) 5 % ointment Apply 1 application topically 4 (four) times daily as needed. To knees and  ankle as needed for pain 35.44 g 6  . Magnesium 400 MG CAPS Take 400 mg by mouth 2 (two) times daily. (Patient taking differently: Take 400 mg by mouth every morning. ) 30 capsule 0  . Menthol-Methyl Salicylate (MUSCLE RUB) 10-15 % CREA Apply 1 application topically as needed. For muscle pain    . metolazone (ZAROXOLYN) 2.5 MG tablet TAKE 1 TABLET DAILY 1 1/2 HOUR PRIOR TO TORSEMIDE FOR 3 DAYS AS NEEDED FOR SEVERE SWELLING 30 tablet 0  . milk thistle 175 MG tablet Take 175 mg by mouth 4 (four) times daily.     . Multiple Vitamins-Minerals (MULTI COMPLETE PO) Take 1 tablet by mouth daily.     Marland Kitchen neomycin-bacitracin-polymyxin (NEOSPORIN) ointment Apply 1 application topically  every 12 (twelve) hours as needed. Reported on 06/01/2015    . nitroGLYCERIN (NITROSTAT) 0.4 MG SL tablet Place 1 tablet (0.4 mg total) under the tongue every 5 (five) minutes as needed for chest pain (may repeat x3). 10 tablet 2  . Omega-3 Fatty Acids (FISH OIL) 1000 MG CAPS Take 1 capsule by mouth daily.     . phenylephrine (NASAL DECONGESTANT) 1 % nasal spray Place 1 drop into the nose every 4 (four) hours as needed. For allergies    . potassium chloride SA (K-DUR,KLOR-CON) 20 MEQ tablet Take 1 tablet (20 mEq total) by mouth 2 (two) times daily. 60 tablet 6  . PROAIR HFA 108 (90 BASE) MCG/ACT inhaler INHALE 2 PUFFS THREE TIMES A DAY AS DIRECTED 8.5 g 4  . torsemide (DEMADEX) 20 MG tablet Take 1 tablet (20 mg total) by mouth 3 (three) times daily. 90 tablet 4  . warfarin (COUMADIN) 2.5 MG tablet Take 1 tablet (2.5 mg total) by mouth daily. 30 tablet 3   No current facility-administered medications for this visit.    Past Medical History  Diagnosis Date  . Gout   . Diabetes mellitus     type 2  . RA (rheumatoid arthritis) ([place])   . COPD (chronic obstructive pulmonary disease) ([place])     with cpap  . Atrial fibrillation ([place])   . Cirrhosis ([place])   . Anxiety   . Heart murmur   . CHF (congestive heart failure) ([place])   . Constipation   . Sleep apnea     cpap used- level 10 and greater  . Neuromuscular disorder (HCC)     neuropathy in hands and feet  . Cirrhosis ([place])     NASH  . Thrombocytopenia ([place])   . Genu varus   . Psoriasis   . GERD (gastroesophageal reflux disease)     Past Surgical History  Procedure Laterality Date  . Sciatic nerve exploration    . Tonsillectomy    . Esophagogastroduodenoscopy  02/28/2011    Procedure: ESOPHAGOGASTRODUODENOSCOPY (EGD);  Surgeon: Rogene Houston, MD;  [facility];  Service: Endoscopy;  Laterality: N/A;  1200  . Colonoscopy  05/08/2011    Procedure: COLONOSCOPY;  Surgeon: Rogene Houston, MD;  [facility];   Service: Endoscopy;  Laterality: N/A;  730  . Liver biopsy  2012  . Ankle surgery      right ankle talor repair  . Esophagogastroduodenoscopy N/A 06/11/2012    Procedure: ESOPHAGOGASTRODUODENOSCOPY (EGD);  Surgeon: Rogene Houston, MD;  [facility];  Service: Endoscopy;  Laterality: N/A;  1200  FYI patient is 400 pounds  . Hernia repair Right     as child; inguinal  . Esophagogastroduodenoscopy (egd) with  propofol N/A 04/21/2014    Procedure: ESOPHAGOGASTRODUODENOSCOPY (EGD) WITH PROPOFOL;  Surgeon: Rogene Houston, MD;  Location: [facility];  Service: Endoscopy;  Laterality: N/A;    ROS:  Back pain.  Otherwise as stated in the HPI and negative for all other systems.  PHYSICAL EXAM Ht 6' (1.829 m)  Wt 376 lb (170.552 kg)  BMI 50.98 kg/m2 GENERAL:  Well appearing NECK:  No jugular venous distention, waveform within normal limits, carotid upstroke brisk and symmetric, no bruits, no thyromegaly LUNGS:  Clear to auscultation bilaterally BACK:  No CVA tenderness CHEST:  Unremarkable HEART:  PMI not displaced or sustained,S1 and S2 within normal limits, no S3, no S4, no clicks, no rubs, no murmurs ABD:  Flat, positive bowel sounds normal in frequency in pitch, no bruits, no rebound, no guarding, no midline pulsatile mass, no hepatomegaly, no splenomegaly, morbidly obese EXT:  2 plus pulses throughout, trace edema, no cyanosis no clubbing SKIN:  Psoriasis, chronic venous stasis changes left greater than right leg   EKG:  Sinus rhythm, rate 76, axis within normal limits, intervals within normal limits.  No acute ST T wave changes.  06/08/2015   ASSESSMENT AND PLAN   ATRIAL FIBRILLATION - He is now back on warfarin. He needs anticoagulation follow-up will try to get this arranged through his primary providers. He needs to stop self-medicating with his dosage however. I will give him his 90 mg diltiazem when necessary in addition to his daily dose. No other change in therapy is  planned.  OBESITY, MORBID -  We have discussed this at multiple appointments.  I am very proud of his weight loss and I encourage more of the same.   DIASTOLIC HEART FAILURE, CHRONIC - He seems to be euvolemic. No further evaluation or change in therapy is indicated.  Review of outside hospital records.

## 2015-06-08 NOTE — Patient Instructions (Signed)
Medication Instructions:  You may take Diltiazem ER 90 mg as needed once a day for palpitations. Continue all other medications as listed.  You are being referred to [facility] to monitor your PT/INR.  You are being referred to Dr Aviva Signs for possible hernia repair.  Follow-Up: Follow up in 1 year with Dr. Percival Spanish in [place].  You will receive a letter in the mail 2 months before you are due.  Please call us when you receive this letter to schedule your follow up appointment.  If you need a refill on your cardiac medications before your next appointment, please call your pharmacy.  Thank you for choosing [facility]!!

## 2015-06-09 ENCOUNTER — Telehealth: Payer: Self-pay | Admitting: Pharmacist

## 2015-06-09 NOTE — Telephone Encounter (Signed)
Received referral for anticoagulation clinic for Mr Wendland from Dr Jenkins Rouge.  He had been discharged from our office due to financial reasons.  He does not have another PCP at this time.  He is coming in 06/10/15 to pay on his bill and make appt with PCP / to have INR checked.

## 2015-06-10 ENCOUNTER — Telehealth: Payer: Self-pay | Admitting: Cardiology

## 2015-06-10 NOTE — Telephone Encounter (Signed)
Please advise 

## 2015-06-10 NOTE — Telephone Encounter (Signed)
New message  6/1  pt calling to check on the status of letter that was supposed to be sent to him by Dr. Percival Spanish

## 2015-06-10 NOTE — Telephone Encounter (Signed)
New Message    Pt was seen 5/31 and received a call in prescription for diltiazem, but pt states it has not be sent to pharmacy. Please call and discuss

## 2015-06-10 NOTE — Telephone Encounter (Signed)
Left message to call back  

## 2015-06-13 NOTE — Telephone Encounter (Signed)
Leave message for pt to call back

## 2015-06-14 ENCOUNTER — Telehealth: Payer: Self-pay | Admitting: Pulmonary Disease

## 2015-06-14 ENCOUNTER — Encounter: Payer: Self-pay | Admitting: *Deleted

## 2015-06-14 NOTE — Telephone Encounter (Signed)
Rec'd a request from [name] for the sleep studies on this patient. I called and spoke with [name] and explained that they were requesting information on him and if he was aware of the companies request. He stated he was going to try and get his CPAP supplies from them. I faxed 8 pages his sleep study to them and received the confirmation from the fax that 8 pages were received

## 2015-06-14 NOTE — Telephone Encounter (Signed)
F/up   Pt returned call

## 2015-06-14 NOTE — Telephone Encounter (Signed)
F/u  Pt returning RN phone call. Please call back and discuss.   

## 2015-06-14 NOTE — Telephone Encounter (Signed)
LMTCB

## 2015-06-14 NOTE — Telephone Encounter (Signed)
Follow up   Pt is returning call for rn, pt verbalized that rn left vm  Pt did not want to disclose what it may be about so I can attempt to assist him

## 2015-06-14 NOTE — Telephone Encounter (Signed)
Spoke with pt.  He is wanting a letter to carry with him to show people that he can take extra diltiazem if necessary.  Advised his bottle of medication stated how he is to take it and he should be carrying his bottle with him.  Advised I will write a letter addressed to who it may concern that he may take prn dilt once daily.  He would like it mailed to his home.

## 2015-06-14 NOTE — Telephone Encounter (Signed)
Left message for pt to c/b.  I don't understand why he needs a written letter to take prn diltiazem - the instructions are on the bottle and also on the AVS given to the pt at his last office visit.  Who does the letter need to be addressed to?

## 2015-06-14 NOTE — Telephone Encounter (Signed)
06-14-15 @ 1214pm-pt rtn call to [facility] regarding letter-he needs it addressed and mailed to him-he needs this so when he is at work or "out" anywhere-he has a note stating he is suppose to take this med

## 2015-06-14 NOTE — Telephone Encounter (Signed)
Returned call to patient.  He was seen in [place] last week He had originally stated he didn't get his new diltiazem Rx, but he states he has it, it was just in a different looking bottle.  Patient needs a letter sent to him stating he can take the extra diltiazem 58m daily as needed for palpitations once daily Informed patient it does not appear this letter has been sent Will send message to Dr. HMartina Sinner RN for follow up on this

## 2015-06-24 ENCOUNTER — Telehealth: Payer: Self-pay | Admitting: Pharmacist

## 2015-06-24 NOTE — Telephone Encounter (Signed)
Tried calling patient so set appt for Protime.  Left Message on VM that it is very important to check Protime / INR while he is taking warfarin because if he gets too much warfarin it could cause bleeding or if too little warfarin he could have blood clot.

## 2015-06-30 ENCOUNTER — Telehealth: Payer: Self-pay | Admitting: Pharmacist

## 2015-06-30 DIAGNOSIS — K432 Incisional hernia without obstruction or gangrene: Secondary | ICD-10-CM | POA: Diagnosis not present

## 2015-06-30 NOTE — Telephone Encounter (Signed)
Error

## 2015-07-07 ENCOUNTER — Encounter: Payer: Self-pay | Admitting: Family Medicine

## 2015-07-07 ENCOUNTER — Ambulatory Visit (INDEPENDENT_AMBULATORY_CARE_PROVIDER_SITE_OTHER): Payer: Medicare HMO | Admitting: Family Medicine

## 2015-07-07 VITALS — BP 121/85 | HR 75 | Temp 97.1°F | Ht 73.83 in | Wt 388.2 lb

## 2015-07-07 DIAGNOSIS — M545 Low back pain: Secondary | ICD-10-CM

## 2015-07-07 DIAGNOSIS — D689 Coagulation defect, unspecified: Secondary | ICD-10-CM | POA: Diagnosis not present

## 2015-07-07 DIAGNOSIS — E118 Type 2 diabetes mellitus with unspecified complications: Secondary | ICD-10-CM

## 2015-07-07 DIAGNOSIS — R7989 Other specified abnormal findings of blood chemistry: Secondary | ICD-10-CM

## 2015-07-07 DIAGNOSIS — K7460 Unspecified cirrhosis of liver: Secondary | ICD-10-CM | POA: Diagnosis not present

## 2015-07-07 DIAGNOSIS — E291 Testicular hypofunction: Secondary | ICD-10-CM | POA: Diagnosis not present

## 2015-07-07 DIAGNOSIS — K7030 Alcoholic cirrhosis of liver without ascites: Secondary | ICD-10-CM

## 2015-07-07 DIAGNOSIS — J45909 Unspecified asthma, uncomplicated: Secondary | ICD-10-CM | POA: Diagnosis not present

## 2015-07-07 DIAGNOSIS — E785 Hyperlipidemia, unspecified: Secondary | ICD-10-CM

## 2015-07-07 DIAGNOSIS — I25709 Atherosclerosis of coronary artery bypass graft(s), unspecified, with unspecified angina pectoris: Secondary | ICD-10-CM | POA: Diagnosis not present

## 2015-07-07 DIAGNOSIS — R162 Hepatomegaly with splenomegaly, not elsewhere classified: Secondary | ICD-10-CM | POA: Diagnosis not present

## 2015-07-07 DIAGNOSIS — I5032 Chronic diastolic (congestive) heart failure: Secondary | ICD-10-CM | POA: Diagnosis not present

## 2015-07-07 DIAGNOSIS — R748 Abnormal levels of other serum enzymes: Secondary | ICD-10-CM

## 2015-07-07 DIAGNOSIS — E119 Type 2 diabetes mellitus without complications: Secondary | ICD-10-CM | POA: Diagnosis not present

## 2015-07-07 DIAGNOSIS — R161 Splenomegaly, not elsewhere classified: Secondary | ICD-10-CM

## 2015-07-07 DIAGNOSIS — D696 Thrombocytopenia, unspecified: Secondary | ICD-10-CM

## 2015-07-07 DIAGNOSIS — I81 Portal vein thrombosis: Secondary | ICD-10-CM

## 2015-07-07 DIAGNOSIS — R600 Localized edema: Secondary | ICD-10-CM

## 2015-07-07 DIAGNOSIS — D72819 Decreased white blood cell count, unspecified: Secondary | ICD-10-CM

## 2015-07-07 DIAGNOSIS — L409 Psoriasis, unspecified: Secondary | ICD-10-CM

## 2015-07-07 DIAGNOSIS — R160 Hepatomegaly, not elsewhere classified: Secondary | ICD-10-CM

## 2015-07-07 DIAGNOSIS — R69 Illness, unspecified: Secondary | ICD-10-CM | POA: Diagnosis not present

## 2015-07-07 LAB — COAGUCHEK XS/INR WAIVED
INR: 1.1 (ref 0.9–1.1)
PROTHROMBIN TIME: 12.8 s

## 2015-07-07 LAB — BAYER DCA HB A1C WAIVED: HB A1C: 5.8 % (ref <7.0)

## 2015-07-07 MED ORDER — ALBUTEROL SULFATE HFA 108 (90 BASE) MCG/ACT IN AERS: 2.0000 | INHALATION_SPRAY | Freq: Four times a day (QID) | RESPIRATORY_TRACT | Status: DC | PRN

## 2015-07-07 MED ORDER — MAGNESIUM 400 MG PO CAPS: 400.0000 mg | ORAL_CAPSULE | Freq: Every morning | ORAL | Status: DC

## 2015-07-07 MED ORDER — IPRATROPIUM BROMIDE 0.02 % IN SOLN: 500.0000 ug | Freq: Four times a day (QID) | RESPIRATORY_TRACT | Status: DC | PRN

## 2015-07-07 MED ORDER — FLUTICASONE PROPIONATE 50 MCG/ACT NA SUSP: 2.0000 | Freq: Every day | NASAL | Status: DC | PRN

## 2015-07-07 MED ORDER — ESOMEPRAZOLE MAGNESIUM 40 MG PO CPDR: 40.0000 mg | DELAYED_RELEASE_CAPSULE | Freq: Every day | ORAL | Status: DC

## 2015-07-07 MED ORDER — METOLAZONE 2.5 MG PO TABS: ORAL_TABLET | ORAL | Status: DC

## 2015-07-07 MED ORDER — TORSEMIDE 20 MG PO TABS: 20.0000 mg | ORAL_TABLET | Freq: Three times a day (TID) | ORAL | Status: DC

## 2015-07-07 MED ORDER — EZETIMIBE 10 MG PO TABS: 10.0000 mg | ORAL_TABLET | Freq: Every day | ORAL | Status: DC

## 2015-07-07 MED ORDER — POTASSIUM CHLORIDE CRYS ER 20 MEQ PO TBCR: 20.0000 meq | EXTENDED_RELEASE_TABLET | Freq: Two times a day (BID) | ORAL | Status: DC

## 2015-07-07 MED ORDER — DOXEPIN HCL 10 MG PO CAPS: 10.0000 mg | ORAL_CAPSULE | Freq: Every evening | ORAL | Status: DC | PRN

## 2015-07-07 NOTE — Progress Notes (Signed)
HPI  Patient presents today returns to the clinic after 1-1/2 years away for follow-up of several complaints.  Chronic back, left knee, right ankle pain His pain management doctor has recently left town. He requests referral to pain management and refill Dilaudid. He states his pain is at baseline, he states he has been out of medication for about one month. Complains that gabapentin is not helping his muscle pain like it once was. States that Lyrica bothers his breathing. He understands not to combine NSAIDs with Coumadin.  COPD, obstructive sleep apnea Requests refill of albuterol States he uses a Cpap Machine regularly. States breathing is at baseline today.  Anxiety Requests refill of Xanax, states that he's been out of this for at least 2 years. He states that it's the only way that he can sleep. Also requests doxepin for sleep.  Anticoagulation Taking 2.5 mg of Coumadin daily States that he's had a portal vein thrombosis as well as A. Fib.  Atrial fibrillation Taking Cardizem as prescribed Also taking Coumadin 2.5 mg daily, has not been following INR recently  Hypogonadism Requests refill of testosterone, he has not been on this in quite a while.  Difficulty losing weight Requests checking thyroid today  PMH: Smoking status noted ROS: Per HPI  Objective: BP 121/85 mmHg  Pulse 75  Temp(Src) 97.1 F (36.2 C) (Oral)  Ht 6' 1.83" (1.875 m)  Wt 388 lb 3.2 oz (176.086 kg)  BMI 50.09 kg/m2 Gen: NAD, alert, cooperative with exam HEENT: NCAT CV: RRR, good S1/S2, no murmur Resp: CTABL, no wheezes, non-labored Abd: Umbilical hernia visible, nontender, soft, nonerythematous Ext: No edema, warm Neuro: Alert and oriented, No gross deficits  Skin Thick gray silvery scale on bilateral elbows consistent with psoriasis Chest with hyperpigmented macular rash consistent with tinea versicolor  Assessment and plan:  # Chronic back, left knee, and right ankle  pain Referring to pain management I explained that I am not able to prescribe his dilaudid, he states that he's been out for one month I do not manage such strong chronic pain medications Continue gabapentin and robaxin  # COPD, obstructive sleep apnea Explained that Xanax use with COPD and obstructive sleep apnea is very dangerous combination. I explained that I will not refill this. Refill albuterol and Atrovent.  # Difficulty sleeping He requests doxepin I declined to prescribe Xanax, he is not in danger of withdrawal as he has been out of this for over a year per his report  # Cirrhosis Checking labs, managed by GI. Taking lactulose  Diastolic CHF Appears euvolemic On high-dose loop diuretic with thiazide diuretic Refilled.  Anticoagulation For atrial fibrillation and portal vein thrombosis Subtherapeutic Increase Coumadin by 10%, 2.5 mg daily, 3.75 mg on Mondays and Fridays Increased cautiously with cirrhosis. Recommended 2 week follow-up as we had several conditions to address today.  Hypogonadism Declined refill today Recheck 2 to establish true hypogonadism  T2DM Diet controlled A1c 5.9 today Needs routine health care maintenance for diabetes  Orders Placed This Encounter  Procedures  . Bayer DCA Hb A1c Waived  . Microalbumin / creatinine urine ratio  . CoaguChek XS/INR Waived  . CBC with Differential  . TSH  . BMP8+EGFR  . Hepatic function panel  . Testosterone    Meds ordered this encounter  Medications  . Testosterone (ANDROGEL) 20.25 MG/1.25GM (1.62%) GEL    Sig: Place onto the skin.  Marland Kitchen glucose blood test strip    Sig: 1 each by Other route as needed  for other. Use as instructed  . albuterol (PROAIR HFA) 108 (90 Base) MCG/ACT inhaler    Sig: Inhale 2 puffs into the lungs every 6 (six) hours as needed for wheezing or shortness of breath.    Dispense:  8.5 g    Refill:  4  . esomeprazole (NEXIUM) 40 MG capsule    Sig: Take 1 capsule (40 mg  total) by mouth daily.    Dispense:  30 capsule    Refill:  5  . ezetimibe (ZETIA) 10 MG tablet    Sig: Take 1 tablet (10 mg total) by mouth daily.    Dispense:  30 tablet    Refill:  11  . fluticasone (FLONASE) 50 MCG/ACT nasal spray    Sig: Place 2 sprays into both nostrils daily as needed. For allergies    Dispense:  16 g    Refill:  5  . ipratropium (ATROVENT) 0.02 % nebulizer solution    Sig: Take 2.5 mLs (500 mcg total) by nebulization 4 (four) times daily as needed. For wheezing    Dispense:  75 mL    Refill:  3  . Magnesium 400 MG CAPS    Sig: Take 400 mg by mouth every morning.    Dispense:  30 capsule    Refill:  3  . potassium chloride SA (K-DUR,KLOR-CON) 20 MEQ tablet    Sig: Take 1 tablet (20 mEq total) by mouth 2 (two) times daily.    Dispense:  60 tablet    Refill:  6  . metolazone (ZAROXOLYN) 2.5 MG tablet    Sig: TAKE 1 TABLET DAILY 1 1/2 HOUR PRIOR TO TORSEMIDE FOR 3 DAYS AS NEEDED FOR SEVERE SWELLING    Dispense:  30 tablet    Refill:  0  . torsemide (DEMADEX) 20 MG tablet    Sig: Take 1 tablet (20 mg total) by mouth 3 (three) times daily.    Dispense:  90 tablet    Refill:  4  . doxepin (SINEQUAN) 10 MG capsule    Sig: Take 1 capsule (10 mg total) by mouth at bedtime as needed.    Dispense:  30 capsule    Refill:  [place], MD [facility] 07/07/2015, 11:48 AM

## 2015-07-07 NOTE — Patient Instructions (Signed)
Please come back in 2 weeks for follow up  For your coumadin, take 1 pill daily and 1.5 pills on Monday and Friday

## 2015-07-08 ENCOUNTER — Other Ambulatory Visit: Payer: Self-pay | Admitting: Family Medicine

## 2015-07-08 ENCOUNTER — Telehealth: Payer: Self-pay | Admitting: Family Medicine

## 2015-07-08 DIAGNOSIS — D72819 Decreased white blood cell count, unspecified: Secondary | ICD-10-CM

## 2015-07-08 DIAGNOSIS — D696 Thrombocytopenia, unspecified: Secondary | ICD-10-CM

## 2015-07-08 HISTORY — DX: Decreased white blood cell count, unspecified: D72.819

## 2015-07-08 LAB — BMP8+EGFR
BUN/Creatinine Ratio: 16 (ref 9–20)
BUN: 13 mg/dL (ref 6–24)
CALCIUM: 9.2 mg/dL (ref 8.7–10.2)
CO2: 23 mmol/L (ref 18–29)
CREATININE: 0.81 mg/dL (ref 0.76–1.27)
Chloride: 95 mmol/L — ABNORMAL LOW (ref 96–106)
GFR, EST AFRICAN AMERICAN: 119 mL/min/{1.73_m2} (ref >59)
GFR, EST NON AFRICAN AMERICAN: 103 mL/min/{1.73_m2} (ref >59)
Glucose: 108 mg/dL — ABNORMAL HIGH (ref 65–99)
Potassium: 3.2 mmol/L — ABNORMAL LOW (ref 3.5–5.2)
SODIUM: 136 mmol/L (ref 134–144)

## 2015-07-08 LAB — CBC WITH DIFFERENTIAL/PLATELET
Basophils Absolute: 0 10*3/uL (ref 0.0–0.2)
Basos: 1 %
EOS (ABSOLUTE): 0.1 10*3/uL (ref 0.0–0.4)
EOS: 6 %
HEMATOCRIT: 41.1 % (ref 37.5–51.0)
Hemoglobin: 14 g/dL (ref 12.6–17.7)
Immature Grans (Abs): 0 10*3/uL (ref 0.0–0.1)
Immature Granulocytes: 0 %
LYMPHS ABS: 0.9 10*3/uL (ref 0.7–3.1)
Lymphs: 34 %
MCH: 29 pg (ref 26.6–33.0)
MCHC: 34.1 g/dL (ref 31.5–35.7)
MCV: 85 fL (ref 79–97)
MONOS ABS: 0.2 10*3/uL (ref 0.1–0.9)
Monocytes: 7 %
NEUTROS PCT: 52 %
Neutrophils Absolute: 1.3 10*3/uL — ABNORMAL LOW (ref 1.4–7.0)
Platelets: 45 10*3/uL — CL (ref 150–379)
RBC: 4.82 x10E6/uL (ref 4.14–5.80)
RDW: 15.2 % (ref 12.3–15.4)
WBC: 2.5 10*3/uL — AB (ref 3.4–10.8)

## 2015-07-08 LAB — TESTOSTERONE: TESTOSTERONE: 474 ng/dL (ref 348–1197)

## 2015-07-08 LAB — HEPATIC FUNCTION PANEL
ALBUMIN: 4.3 g/dL (ref 3.5–5.5)
ALK PHOS: 64 IU/L (ref 39–117)
ALT: 28 IU/L (ref 0–44)
AST: 34 IU/L (ref 0–40)
BILIRUBIN TOTAL: 0.8 mg/dL (ref 0.0–1.2)
Bilirubin, Direct: 0.28 mg/dL (ref 0.00–0.40)
Total Protein: 6.6 g/dL (ref 6.0–8.5)

## 2015-07-08 LAB — MICROALBUMIN / CREATININE URINE RATIO
CREATININE, UR: 133.5 mg/dL
MICROALB/CREAT RATIO: 3.5 mg/g creat (ref 0.0–30.0)
Microalbumin, Urine: 4.7 ug/mL

## 2015-07-08 LAB — TSH: TSH: 2.76 u[IU]/mL (ref 0.450–4.500)

## 2015-07-11 ENCOUNTER — Other Ambulatory Visit: Payer: Medicare HMO

## 2015-07-11 ENCOUNTER — Encounter: Payer: Self-pay | Admitting: Family Medicine

## 2015-07-11 DIAGNOSIS — D696 Thrombocytopenia, unspecified: Secondary | ICD-10-CM | POA: Diagnosis not present

## 2015-07-11 DIAGNOSIS — R69 Illness, unspecified: Secondary | ICD-10-CM | POA: Diagnosis not present

## 2015-07-11 DIAGNOSIS — D72819 Decreased white blood cell count, unspecified: Secondary | ICD-10-CM | POA: Diagnosis not present

## 2015-07-11 NOTE — Telephone Encounter (Signed)
done

## 2015-07-12 LAB — CBC WITH DIFFERENTIAL/PLATELET
BASOS ABS: 0.1 10*3/uL (ref 0.0–0.2)
BASOS: 1 %
EOS (ABSOLUTE): 0.2 10*3/uL (ref 0.0–0.4)
Eos: 6 %
HEMATOCRIT: 40.3 % (ref 37.5–51.0)
HEMOGLOBIN: 13.8 g/dL (ref 12.6–17.7)
Immature Grans (Abs): 0 10*3/uL (ref 0.0–0.1)
Immature Granulocytes: 1 %
LYMPHS: 31 %
Lymphocytes Absolute: 0.8 10*3/uL (ref 0.7–3.1)
MCH: 28.9 pg (ref 26.6–33.0)
MCHC: 34.2 g/dL (ref 31.5–35.7)
MCV: 85 fL (ref 79–97)
MONOS ABS: 0.2 10*3/uL (ref 0.1–0.9)
Monocytes: 8 %
NEUTROS ABS: 1.3 10*3/uL — AB (ref 1.4–7.0)
NEUTROS PCT: 53 %
Platelets: 50 10*3/uL — CL (ref 150–379)
RBC: 4.77 x10E6/uL (ref 4.14–5.80)
RDW: 15.6 % — ABNORMAL HIGH (ref 12.3–15.4)
WBC: 2.4 10*3/uL — CL (ref 3.4–10.8)

## 2015-07-13 ENCOUNTER — Telehealth (INDEPENDENT_AMBULATORY_CARE_PROVIDER_SITE_OTHER): Payer: Self-pay | Admitting: Internal Medicine

## 2015-07-13 DIAGNOSIS — R69 Illness, unspecified: Secondary | ICD-10-CM | POA: Diagnosis not present

## 2015-07-13 DIAGNOSIS — G4733 Obstructive sleep apnea (adult) (pediatric): Secondary | ICD-10-CM | POA: Diagnosis not present

## 2015-07-13 DIAGNOSIS — J449 Chronic obstructive pulmonary disease, unspecified: Secondary | ICD-10-CM | POA: Diagnosis not present

## 2015-07-13 DIAGNOSIS — E662 Morbid (severe) obesity with alveolar hypoventilation: Secondary | ICD-10-CM | POA: Diagnosis not present

## 2015-07-13 DIAGNOSIS — I10 Essential (primary) hypertension: Secondary | ICD-10-CM | POA: Diagnosis not present

## 2015-07-13 NOTE — Telephone Encounter (Signed)
Patient called, he'd like his results  [phone]

## 2015-07-14 NOTE — Telephone Encounter (Signed)
Message left on his phone that I did not order new lab work. Needs to call Dr. Wendi Snipes

## 2015-07-18 ENCOUNTER — Other Ambulatory Visit: Payer: Self-pay | Admitting: Family Medicine

## 2015-07-18 DIAGNOSIS — D72819 Decreased white blood cell count, unspecified: Secondary | ICD-10-CM

## 2015-07-18 DIAGNOSIS — D696 Thrombocytopenia, unspecified: Secondary | ICD-10-CM

## 2015-07-18 LAB — PATHOLOGIST SMEAR REVIEW: Path Rev RBC: NORMAL

## 2015-07-20 ENCOUNTER — Telehealth: Payer: Self-pay | Admitting: Cardiology

## 2015-07-20 NOTE — Telephone Encounter (Signed)
Records rec from [facility] give to [facility] on Thursday 07/21/15/KM

## 2015-07-21 ENCOUNTER — Telehealth (INDEPENDENT_AMBULATORY_CARE_PROVIDER_SITE_OTHER): Payer: Self-pay | Admitting: Internal Medicine

## 2015-07-21 NOTE — Telephone Encounter (Signed)
Patient would like you to call him.  [phone]

## 2015-07-22 NOTE — Telephone Encounter (Signed)
Advised to get pain meds from PCP

## 2015-07-26 ENCOUNTER — Other Ambulatory Visit: Payer: Self-pay

## 2015-07-26 MED ORDER — DILTIAZEM HCL ER COATED BEADS 300 MG PO CP24: 300.0000 mg | ORAL_CAPSULE | Freq: Every day | ORAL | Status: DC

## 2015-07-28 ENCOUNTER — Encounter: Payer: Self-pay | Admitting: Family Medicine

## 2015-07-28 ENCOUNTER — Ambulatory Visit (INDEPENDENT_AMBULATORY_CARE_PROVIDER_SITE_OTHER): Payer: Medicare HMO | Admitting: Family Medicine

## 2015-07-28 VITALS — BP 142/82 | HR 86 | Temp 96.7°F | Ht 73.83 in | Wt 391.4 lb

## 2015-07-28 DIAGNOSIS — N5089 Other specified disorders of the male genital organs: Secondary | ICD-10-CM

## 2015-07-28 DIAGNOSIS — D696 Thrombocytopenia, unspecified: Secondary | ICD-10-CM

## 2015-07-28 DIAGNOSIS — M545 Low back pain: Secondary | ICD-10-CM

## 2015-07-28 DIAGNOSIS — D72819 Decreased white blood cell count, unspecified: Secondary | ICD-10-CM

## 2015-07-28 MED ORDER — HYDROCODONE-ACETAMINOPHEN 10-325 MG PO TABS: 1.0000 | ORAL_TABLET | Freq: Two times a day (BID) | ORAL | Status: DC | PRN

## 2015-07-28 MED ORDER — HYDROCODONE-IBUPROFEN 10-200 MG PO TABS: ORAL_TABLET | ORAL | Status: DC

## 2015-07-28 NOTE — Progress Notes (Signed)
HPI  Patient presents today here today for follow-up.  He complains of scrotal and testicular enlargement as well as pain.  He states that his leukopenia and thrombocytopenia have been long-standing. He states that he saw hematology at [facility] before and told there is nothing to do. He states that he does not bleed excessively. He cannot explain any details of his treatment or evaluation.  Low back pain Chronic back pain for more than 10 years. He states that he's been treated by pain management with Dilaudid for over 2 years. His last prescription was in May by GI who was trying to help him. Patient requests repeatedly narcotic prescriptions. As an appointment with pain management in 12 days.  Testicular swelling Patient states he's had a long history of testicular swelling, after injury 10 years ago he had very large swelling, he states he was told there were 3 L of fluid in his scrotum. Over the last several months, 4-6 months, he states that he's had recurrence of swelling. He has discomfort but no overt pain, redness, or tenderness. There are no masses. His left testicle is swollen and large, his right testicle is normal size to him.   PMH: Smoking status noted ROS: Per HPI  Objective: BP 142/82 mmHg  Pulse 86  Temp(Src) 96.7 F (35.9 C) (Oral)  Ht 6' 1.83" (1.875 m)  Wt 391 lb 6.4 oz (177.538 kg)  BMI 50.50 kg/m2 Gen: NAD, alert, cooperative with exam HEENT: NCAT CV: RRR, good S1/S2, no murmur Resp: CTABL, no wheezes, non-labored Ext: No edema, warm Neuro: Alert and oriented, No gross deficits GU: Enlarged left testicle with evident fluid in the scrotal sac, no erythema, warmth, tenderness, or masses felt. Penis not examined.  Skin After patient was told that he had low platelets he intentionally scratched his left lateral lower leg causing several areas of bleeding in stating that he is not bleeding easily. After pressure and cleaning off with alcohol and  gauze the bleeding was controlled by the patient.  Assessment and plan:  # Testicular enlargement, scrotal enlargement No apparent emergent intervention needed. Recommended urology referral which has been placed today.  Recommended supportive underwear.  # Back pain Patient treated chronically with Dilaudid. I explained that Dilaudid is not a medication that I prescribed due to its very high potency. I have agreed to give him enough hydrocodone to have 2 pills a day until he can be prescribed medications by pain management. He is agreeable to this plan. He is also very thankful. 10 mg Vicoprofen given, #24 pills for this month, #60 pills for next month I was very clear that I would not prescribe this again and that he would have to follow up with pain management for additional narcotic treatment.  # Leukopenia Absolute neutrophil count is 1200. Discussed low threshold for seeking medical attention for any concern of infection. He minimizes importance but agrees.  # Thrombocytopenia Bleeding stopped in clinic after his excoriation in front of me. I recommended using electric shaver rather than typical razor, patient is not concerned Follow-up with hematology for leukopenia and thrombocytopenia, appreciate their recommendations and management.    Orders Placed This Encounter  Procedures  . Ambulatory referral to Urology    Referral Priority:  Routine    Referral Type:  Consultation    Referral Reason:  Specialty Services Required    Requested Specialty:  Urology    Number of Visits Requested:  1    Meds ordered this encounter  Medications  .  HYDROcodone-acetaminophen (NORCO) 10-325 MG tablet    Sig: Take 1 tablet by mouth 2 (two) times daily as needed.    Dispense:  24 tablet    Refill:  0  . HYDROcodone-acetaminophen (NORCO) 10-325 MG tablet    Sig: Take 1 tablet by mouth 2 (two) times daily as needed.    Dispense:  60 tablet    Refill:  0    Please do not fill until  08/09/2015    [name], MD [facility] 07/28/2015, 9:31 AM

## 2015-07-28 NOTE — Patient Instructions (Signed)
   Given your prescription for hydrocodone, I will not be able to refill this prescription, you have been provided 2 pills a day from now until you can receive prescriptions from pain management.  He will also get a referral to urology, please get seen immediately for any sudden testicle pain or changes.   Please come back to the doctor or seek immediate medical attention for any concerns of infection. Also be very careful not to cut yourself as your platelets are very low and you may bleed easier than usual.

## 2015-08-03 ENCOUNTER — Telehealth: Payer: Self-pay | Admitting: Family Medicine

## 2015-08-03 NOTE — Telephone Encounter (Signed)
Patient is requesting a prescription for colchichine. Patient states he has enough to last him until tomorrow.

## 2015-08-04 MED ORDER — COLCHICINE 0.6 MG PO TABS: 0.6000 mg | ORAL_TABLET | Freq: Every day | ORAL | 2 refills | Status: DC | PRN

## 2015-08-04 NOTE — Telephone Encounter (Signed)
Pt aware [facility]

## 2015-08-04 NOTE — Telephone Encounter (Signed)
I have sent a refill of colchicine.  [name], MD [facility] 08/04/2015, 7:39 AM

## 2015-08-09 ENCOUNTER — Other Ambulatory Visit (INDEPENDENT_AMBULATORY_CARE_PROVIDER_SITE_OTHER): Payer: Self-pay | Admitting: Internal Medicine

## 2015-08-09 DIAGNOSIS — M545 Low back pain: Secondary | ICD-10-CM | POA: Diagnosis not present

## 2015-08-09 DIAGNOSIS — M25569 Pain in unspecified knee: Secondary | ICD-10-CM | POA: Diagnosis not present

## 2015-08-09 DIAGNOSIS — M25561 Pain in right knee: Secondary | ICD-10-CM | POA: Diagnosis not present

## 2015-08-09 DIAGNOSIS — I8290 Acute embolism and thrombosis of unspecified vein: Secondary | ICD-10-CM

## 2015-08-09 DIAGNOSIS — M179 Osteoarthritis of knee, unspecified: Secondary | ICD-10-CM | POA: Diagnosis not present

## 2015-08-09 DIAGNOSIS — Z79899 Other long term (current) drug therapy: Secondary | ICD-10-CM | POA: Diagnosis not present

## 2015-08-09 DIAGNOSIS — M79605 Pain in left leg: Secondary | ICD-10-CM | POA: Diagnosis not present

## 2015-08-09 DIAGNOSIS — G8929 Other chronic pain: Secondary | ICD-10-CM | POA: Diagnosis not present

## 2015-08-15 ENCOUNTER — Other Ambulatory Visit: Payer: Self-pay | Admitting: Family Medicine

## 2015-08-19 ENCOUNTER — Telehealth: Payer: Self-pay | Admitting: Family Medicine

## 2015-08-19 NOTE — Telephone Encounter (Signed)
LMOM that will mail information about appointmnent

## 2015-08-20 DIAGNOSIS — R69 Illness, unspecified: Secondary | ICD-10-CM | POA: Diagnosis not present

## 2015-08-24 ENCOUNTER — Other Ambulatory Visit: Payer: Self-pay

## 2015-08-24 MED ORDER — DILTIAZEM HCL ER COATED BEADS 300 MG PO CP24: 300.0000 mg | ORAL_CAPSULE | Freq: Every day | ORAL | 8 refills | Status: DC

## 2015-08-26 ENCOUNTER — Telehealth: Payer: Self-pay | Admitting: Family Medicine

## 2015-08-30 ENCOUNTER — Telehealth: Payer: Self-pay | Admitting: Pharmacist

## 2015-08-30 NOTE — Telephone Encounter (Signed)
Patient is past due to recheck protime.  Tried to call - no answer but Lm on VM Explained importance of checking INR regularly and of risk of blood clots if too thick or bleeding if too thin.  Asked patient to call to make app for INR ASAP.

## 2015-08-31 ENCOUNTER — Ambulatory Visit (INDEPENDENT_AMBULATORY_CARE_PROVIDER_SITE_OTHER): Payer: Medicare HMO | Admitting: Pharmacist

## 2015-08-31 DIAGNOSIS — I8290 Acute embolism and thrombosis of unspecified vein: Secondary | ICD-10-CM | POA: Diagnosis not present

## 2015-08-31 DIAGNOSIS — Z7901 Long term (current) use of anticoagulants: Secondary | ICD-10-CM | POA: Diagnosis not present

## 2015-08-31 DIAGNOSIS — Z86718 Personal history of other venous thrombosis and embolism: Secondary | ICD-10-CM

## 2015-08-31 DIAGNOSIS — I81 Portal vein thrombosis: Secondary | ICD-10-CM

## 2015-08-31 LAB — COAGUCHEK XS/INR WAIVED
INR: 1.3 — AB (ref 0.9–1.1)
PROTHROMBIN TIME: 15.6 s

## 2015-08-31 MED ORDER — WARFARIN SODIUM 2.5 MG PO TABS: 3.7500 mg | ORAL_TABLET | Freq: Every day | ORAL | 1 refills | Status: DC

## 2015-09-02 ENCOUNTER — Encounter (HOSPITAL_COMMUNITY): Payer: Medicare HMO

## 2015-09-02 ENCOUNTER — Encounter (HOSPITAL_COMMUNITY): Payer: Medicare HMO | Attending: Hematology & Oncology | Admitting: Hematology & Oncology

## 2015-09-02 ENCOUNTER — Encounter (HOSPITAL_COMMUNITY): Payer: Self-pay | Admitting: Hematology & Oncology

## 2015-09-02 ENCOUNTER — Ambulatory Visit (HOSPITAL_COMMUNITY): Payer: Medicare HMO | Admitting: Hematology & Oncology

## 2015-09-02 VITALS — BP 128/71 | HR 81 | Temp 97.5°F | Resp 22 | Wt 396.5 lb

## 2015-09-02 DIAGNOSIS — D61818 Other pancytopenia: Secondary | ICD-10-CM

## 2015-09-02 DIAGNOSIS — I81 Portal vein thrombosis: Secondary | ICD-10-CM | POA: Diagnosis not present

## 2015-09-02 DIAGNOSIS — K7460 Unspecified cirrhosis of liver: Secondary | ICD-10-CM | POA: Insufficient documentation

## 2015-09-02 DIAGNOSIS — R161 Splenomegaly, not elsewhere classified: Secondary | ICD-10-CM | POA: Diagnosis not present

## 2015-09-02 DIAGNOSIS — D72819 Decreased white blood cell count, unspecified: Secondary | ICD-10-CM | POA: Insufficient documentation

## 2015-09-02 DIAGNOSIS — D696 Thrombocytopenia, unspecified: Secondary | ICD-10-CM | POA: Diagnosis not present

## 2015-09-02 DIAGNOSIS — L409 Psoriasis, unspecified: Secondary | ICD-10-CM | POA: Diagnosis not present

## 2015-09-02 DIAGNOSIS — Z7901 Long term (current) use of anticoagulants: Secondary | ICD-10-CM

## 2015-09-02 LAB — VITAMIN B12: VITAMIN B 12: 870 pg/mL (ref 180–914)

## 2015-09-02 LAB — CBC WITH DIFFERENTIAL/PLATELET
BASOS ABS: 0 10*3/uL (ref 0.0–0.1)
BASOS PCT: 2 %
Eosinophils Absolute: 0.1 10*3/uL (ref 0.0–0.7)
Eosinophils Relative: 6 %
HEMATOCRIT: 38.1 % — AB (ref 39.0–52.0)
HEMOGLOBIN: 13.1 g/dL (ref 13.0–17.0)
Lymphocytes Relative: 31 %
Lymphs Abs: 0.7 10*3/uL (ref 0.7–4.0)
MCH: 28.5 pg (ref 26.0–34.0)
MCHC: 34.4 g/dL (ref 30.0–36.0)
MCV: 82.8 fL (ref 78.0–100.0)
MONOS PCT: 8 %
Monocytes Absolute: 0.2 10*3/uL (ref 0.1–1.0)
NEUTROS ABS: 1.2 10*3/uL — AB (ref 1.7–7.7)
NEUTROS PCT: 53 %
Platelets: 57 10*3/uL — ABNORMAL LOW (ref 150–400)
RBC: 4.6 MIL/uL (ref 4.22–5.81)
RDW: 13.7 % (ref 11.5–15.5)
WBC: 2.3 10*3/uL — AB (ref 4.0–10.5)

## 2015-09-02 LAB — FOLATE: Folate: 20.8 ng/mL (ref >5.9)

## 2015-09-02 LAB — FERRITIN: Ferritin: 68 ng/mL (ref 24–336)

## 2015-09-02 NOTE — Patient Instructions (Addendum)
[place] at [facility] Discharge Instructions  RECOMMENDATIONS MADE BY THE CONSULTANT AND ANY TEST RESULTS WILL BE SENT TO YOUR REFERRING PHYSICIAN.  You saw Dr. Whitney Muse today. Labs today. We will call you with results if needed. Follow up with Tom in 3 weeks.  Thank you for choosing [place] at [facility] to provide your oncology and hematology care.  To afford each patient quality time with our provider, please arrive at least 15 minutes before your scheduled appointment time.   Beginning January 23rd 2017 lab work for the [facility] will be done in the  Main lab at [facility] on 1st floor. If you have a lab appointment with the [facility] please come in thru the  Main Entrance and check in at the main information desk  You need to re-schedule your appointment should you arrive 10 or more minutes late.  We strive to give you quality time with our providers, and arriving late affects you and other patients whose appointments are after yours.  Also, if you no show three or more times for appointments you may be dismissed from the clinic at the providers discretion.     Again, thank you for choosing [facility].  Our hope is that these requests will decrease the amount of time that you wait before being seen by our physicians.       _____________________________________________________________  Should you have questions after your visit to [facility], please contact our office at [phone] between the hours of 8:30 a.m. and 4:30 p.m.  Voicemails left after 4:30 p.m. will not be returned until the following business day.  For prescription refill requests, have your pharmacy contact our office.         Resources For Cancer Patients and their Caregivers ? American Cancer Society: Can assist with transportation, wigs, general needs, runs Look Good Feel Better.        514 268 2821 ? Cancer  Care: Provides financial assistance, online support groups, medication/co-pay assistance.  1-800-813-HOPE 216-500-8600) ? [facility] Assists [place] cancer patients and their families through emotional , educational and financial support.  325-672-5549 ? [place] DSS Where to apply for food stamps, Medicaid and utility assistance. 480 730 4156 ? [facility]: Transportation to medical appointments. (604) 473-0981 ? Social Security Administration: May apply for disability if have a Stage IV cancer. 216-268-6790 913-254-1510 ? [employer], Disability and Transit Services: Assists with nutrition, care and transit needs. [place] Support Programs: @10RELATIVEDAYS @ > Cancer Support Group  2nd Tuesday of the month 1pm-2pm, Journey Room  > Creative Journey  3rd Tuesday of the month 1130am-1pm, Journey Room  > Look Good Feel Better  1st Wednesday of the month 10am-12 noon, Journey Room (Call [place] to register [phone])
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
## 2015-09-05 LAB — PATHOLOGIST SMEAR REVIEW

## 2015-09-06 ENCOUNTER — Telehealth (INDEPENDENT_AMBULATORY_CARE_PROVIDER_SITE_OTHER): Payer: Self-pay | Admitting: Internal Medicine

## 2015-09-06 DIAGNOSIS — M545 Low back pain: Secondary | ICD-10-CM | POA: Diagnosis not present

## 2015-09-06 DIAGNOSIS — M25569 Pain in unspecified knee: Secondary | ICD-10-CM | POA: Diagnosis not present

## 2015-09-06 DIAGNOSIS — G8929 Other chronic pain: Secondary | ICD-10-CM | POA: Diagnosis not present

## 2015-09-06 DIAGNOSIS — Z79891 Long term (current) use of opiate analgesic: Secondary | ICD-10-CM | POA: Diagnosis not present

## 2015-09-06 NOTE — Telephone Encounter (Signed)
Spoke with [place]. May increase Lactulose to TID. He says he has been constipated since starting the Morphine for his leg pain

## 2015-09-06 NOTE — Telephone Encounter (Signed)
Patient left a message, would like for Terri to call him, he did not leave any details.  [phone]

## 2015-09-07 ENCOUNTER — Telehealth: Payer: Self-pay | Admitting: Family Medicine

## 2015-09-08 DIAGNOSIS — R69 Illness, unspecified: Secondary | ICD-10-CM | POA: Diagnosis not present

## 2015-09-09 ENCOUNTER — Encounter: Payer: Self-pay | Admitting: Pharmacist Clinician (PhC)/ Clinical Pharmacy Specialist

## 2015-09-09 ENCOUNTER — Ambulatory Visit: Payer: Medicare HMO | Admitting: Urology

## 2015-09-09 NOTE — Telephone Encounter (Signed)
Indomethacin denied prior authorization  PCP informed and did not received a prior authorization notice from [facility] about Metformin

## 2015-09-16 ENCOUNTER — Telehealth: Payer: Self-pay | Admitting: Family Medicine

## 2015-09-19 ENCOUNTER — Telehealth: Payer: Self-pay | Admitting: Pharmacist

## 2015-09-19 ENCOUNTER — Ambulatory Visit (INDEPENDENT_AMBULATORY_CARE_PROVIDER_SITE_OTHER): Payer: Medicare HMO | Admitting: Pharmacist

## 2015-09-19 DIAGNOSIS — I81 Portal vein thrombosis: Secondary | ICD-10-CM

## 2015-09-19 DIAGNOSIS — Z7901 Long term (current) use of anticoagulants: Secondary | ICD-10-CM

## 2015-09-19 DIAGNOSIS — Z86718 Personal history of other venous thrombosis and embolism: Secondary | ICD-10-CM

## 2015-09-19 LAB — COAGUCHEK XS/INR WAIVED
INR: 1.6 — ABNORMAL HIGH (ref 0.9–1.1)
PROTHROMBIN TIME: 19.7 s

## 2015-09-19 MED ORDER — COLCHICINE 0.6 MG PO TABS: 0.6000 mg | ORAL_TABLET | Freq: Two times a day (BID) | ORAL | 2 refills | Status: DC | PRN

## 2015-09-19 MED ORDER — METFORMIN HCL 500 MG PO TABS: 500.0000 mg | ORAL_TABLET | Freq: Two times a day (BID) | ORAL | 1 refills | Status: DC

## 2015-09-19 NOTE — Telephone Encounter (Signed)
Pt has to use [facility] for transportation and has to give them at least week notice before an appointment. alluporinol & uloric has not helped in the past. Takes the colchicine daily but when he has a flare-up he takes 3 a day for 3 days. He has had a couple of flare-ups this month. Can he get a refill for #45. His next appointment is not until 10/23 with you.

## 2015-09-19 NOTE — Addendum Note (Signed)
Addended by: Timmothy Euler on: 09/19/2015 05:18 PM   Modules accepted: Orders

## 2015-09-19 NOTE — Telephone Encounter (Signed)
Appreciate additional info,   Refilled #[id number] colchicine  [name], MD [facility] 09/19/2015, 5:16 PM

## 2015-09-19 NOTE — Progress Notes (Signed)
Patient ID: [name], male   DOB: Sep 29, 1965, 50 y.o.   MRN: [id number] Subjective:     Indication: atrial fibrillation Bleeding signs/symptoms: None Thromboembolic signs/symptoms: None  Missed Coumadin doses: None Medication changes: no Dietary changes: no Bacterial/viral infection: no Other concerns: yes - patient would like to have colchicine Rx increased from #30 to #60 because he has limited transportation and has been having to fill every 15 to 20 days. He also states that his BG at home has been higher recently.  Patient's last A1c was 5.8%.  Would like to restart metformin.   Lastly, patient has arthritis pain in both knees.  Pain is not relieved by his current pain medications - morphine ER and IR.  He has taken Celebrex prn in past and asks if he can take again.   The following portions of the patient's history were reviewed and updated as appropriate: allergies, current medications, past family history, past medical history, past social history, past surgical history and problem list.   Objective:    INR Today: 1.6 (not at goal but improved from 1.3 at lat visit) Current dose: warfarin 2.12m tablets - take 1 and 1/2 tablets (=3.770m daily     Assessment:    Subtherapeutic INR for goal of 2-3   Type 2 DM Arthritis  Plan:    1. New dose: increase to 2 tablets (=[id number] mondays, wednesdays and fridays.  Take 1 and 1/2 tablet all other days   2. Next INR: 2 weeks   3.  Rx for metformin sent to pharmacy.  4.  Note to PCP sent regarding colchicine and Celebrex - if starts celebrex will need to follow INR closely as it can increase INR.

## 2015-09-19 NOTE — Telephone Encounter (Signed)
Needs to be seen.   Needs controller meds for gout.   [name] Apple, MD [facility] 09/19/2015, 4:18 PM

## 2015-09-25 ENCOUNTER — Encounter (HOSPITAL_COMMUNITY): Payer: Self-pay | Admitting: Hematology & Oncology

## 2015-09-29 ENCOUNTER — Encounter (HOSPITAL_COMMUNITY): Payer: Medicare HMO | Attending: Hematology & Oncology | Admitting: Oncology

## 2015-09-29 ENCOUNTER — Encounter (HOSPITAL_COMMUNITY): Payer: Self-pay | Admitting: Oncology

## 2015-09-29 ENCOUNTER — Encounter (HOSPITAL_COMMUNITY): Payer: Medicare HMO

## 2015-09-29 VITALS — BP 145/82 | HR 79 | Temp 98.0°F | Resp 20 | Wt 386.6 lb

## 2015-09-29 DIAGNOSIS — R161 Splenomegaly, not elsewhere classified: Secondary | ICD-10-CM | POA: Diagnosis not present

## 2015-09-29 DIAGNOSIS — R609 Edema, unspecified: Secondary | ICD-10-CM

## 2015-09-29 DIAGNOSIS — L409 Psoriasis, unspecified: Secondary | ICD-10-CM | POA: Diagnosis not present

## 2015-09-29 DIAGNOSIS — D696 Thrombocytopenia, unspecified: Secondary | ICD-10-CM

## 2015-09-29 DIAGNOSIS — K7460 Unspecified cirrhosis of liver: Secondary | ICD-10-CM | POA: Insufficient documentation

## 2015-09-29 DIAGNOSIS — Z0000 Encounter for general adult medical examination without abnormal findings: Secondary | ICD-10-CM

## 2015-09-29 DIAGNOSIS — D72819 Decreased white blood cell count, unspecified: Secondary | ICD-10-CM

## 2015-09-29 DIAGNOSIS — D61818 Other pancytopenia: Secondary | ICD-10-CM | POA: Insufficient documentation

## 2015-09-29 LAB — RENAL FUNCTION PANEL
ALBUMIN: 4.1 g/dL (ref 3.5–5.0)
ANION GAP: 7 (ref 5–15)
BUN: 12 mg/dL (ref 6–20)
CALCIUM: 9 mg/dL (ref 8.9–10.3)
CO2: 23 mmol/L (ref 22–32)
Chloride: 106 mmol/L (ref 101–111)
Creatinine, Ser: 0.8 mg/dL (ref 0.61–1.24)
GFR calc Af Amer: >60 mL/min (ref >60)
GLUCOSE: 129 mg/dL — AB (ref 65–99)
PHOSPHORUS: 3.9 mg/dL (ref 2.5–4.6)
Potassium: 4 mmol/L (ref 3.5–5.1)
SODIUM: 136 mmol/L (ref 135–145)

## 2015-09-29 LAB — CBC WITH DIFFERENTIAL/PLATELET
BASOS ABS: 0 10*3/uL (ref 0.0–0.1)
BASOS PCT: 1 %
Eosinophils Absolute: 0.1 10*3/uL (ref 0.0–0.7)
Eosinophils Relative: 4 %
HEMATOCRIT: 38.3 % — AB (ref 39.0–52.0)
Hemoglobin: 13 g/dL (ref 13.0–17.0)
LYMPHS PCT: 41 %
Lymphs Abs: 0.6 10*3/uL — ABNORMAL LOW (ref 0.7–4.0)
MCH: 28.2 pg (ref 26.0–34.0)
MCHC: 33.9 g/dL (ref 30.0–36.0)
MCV: 83.1 fL (ref 78.0–100.0)
MONO ABS: 0.1 10*3/uL (ref 0.1–1.0)
Monocytes Relative: 9 %
NEUTROS ABS: 0.7 10*3/uL — AB (ref 1.7–7.7)
Neutrophils Relative %: 45 %
PLATELETS: 38 10*3/uL — AB (ref 150–400)
RBC: 4.61 MIL/uL (ref 4.22–5.81)
RDW: 14.4 % (ref 11.5–15.5)
WBC: 1.5 10*3/uL — AB (ref 4.0–10.5)

## 2015-09-29 LAB — FERRITIN: Ferritin: 104 ng/mL (ref 24–336)

## 2015-09-29 MED ORDER — INFLUENZA VAC SPLIT QUAD 0.5 ML IM SUSY: 0.5000 mL | PREFILLED_SYRINGE | Freq: Once | INTRAMUSCULAR | Status: DC

## 2015-09-29 NOTE — Assessment & Plan Note (Addendum)
Long standing leukopenia/thrombocytopenia secondary to cirrhosis due to EtOH and possible NAFLD and splenomegaly.   Previous labs reviewed: CBC diff, anemia panel.  I personally reviewed and went over laboratory results with the patient.  The results are noted within this dictation.  No vitamin deficiency is noted.  Peripheral flow cytometry was cancelled by pathology as smear review was unimpressive.  Labs today: CBC diff.  Labs today demonstrate a WBC and platelet count below baseline.  As a result, I will get him set-up for bone marrow aspiration and biopsy.  Since lab results are reported after patient has left, I will ask nurse navigator to call patient and explain this procedure and reasoning for this test.  CT biopsy is ordered by IR.  Labs in 3 months: CBC diff, ferritin, renal function panel.  Return in 3 months for follow-up.

## 2015-09-29 NOTE — Progress Notes (Signed)
[name], MD [place] [address]  Thrombocytopenia [facility]) - Plan: CBC with Differential, CBC with Differential, Ferritin, Renal function panel, CBC with Differential, Ferritin, Renal function panel, CT Biopsy  Leukopenia - Plan: CBC with Differential, CBC with Differential, Ferritin, Renal function panel, CBC with Differential, Ferritin, Renal function panel, CT Biopsy  Preventative health care - Plan: Influenza vac split quadrivalent PF (FLUARIX) injection 0.5 mL  CURRENT THERAPY: Observation  INTERVAL HISTORY: [name] 50 y.o. male returns for followup of long standing leukopenia/thrombocytopenia secondary to cirrhosis due to EtOH and possible NAFLD and splenomegaly.   AND Grade 1 esophageal varices with portal gastropathy on EGD 04/21/2014.  He notes that his current pain medication has increased his lower extremity edema. Additionally, he notes increased bleeding with trauma. This is likely secondary to Coumadin use in addition to low platelets.  He denies a new rashes. He denies spontaneous bleeding.  Review of Systems  Constitutional: Negative.  Negative for chills, fever, malaise/fatigue and weight loss.  HENT: Negative.  Negative for nosebleeds.   Eyes: Negative.   Respiratory: Negative.  Negative for cough and hemoptysis.   Cardiovascular: Positive for leg swelling. Negative for chest pain.  Gastrointestinal: Negative.   Genitourinary: Negative.   Musculoskeletal: Negative.   Skin: Negative.  Negative for rash.  Neurological: Negative.  Negative for weakness and headaches.  Endo/Heme/Allergies: Bruises/bleeds easily.  Psychiatric/Behavioral: Negative.     Past Medical History:  Diagnosis Date  . Anxiety   . Atrial fibrillation ([place])   . CHF (congestive heart failure) ([place])   . Cirrhosis ([place])   . Cirrhosis ([place])    NASH  . Constipation   . COPD (chronic obstructive pulmonary disease) ([place])    with cpap  . Diabetes  mellitus    type 2  . Genu varus   . GERD (gastroesophageal reflux disease)   . Gout   . Heart murmur   . Leukopenia 07/08/2015  . Neuromuscular disorder (HCC)    neuropathy in hands and feet  . Psoriasis   . RA (rheumatoid arthritis) ([place])   . Sleep apnea    cpap used- level 10 and greater  . Thrombocytopenia ([place])     Past Surgical History:  Procedure Laterality Date  . ANKLE SURGERY     right ankle talor repair  . CHOLECYSTECTOMY  sept 2016  . COLONOSCOPY  05/08/2011   Procedure: COLONOSCOPY;  Surgeon: Rogene Houston, MD;  [facility];  Service: Endoscopy;  Laterality: N/A;  730  . ESOPHAGOGASTRODUODENOSCOPY  02/28/2011   Procedure: ESOPHAGOGASTRODUODENOSCOPY (EGD);  Surgeon: Rogene Houston, MD;  Location: [facility];  Service: Endoscopy;  Laterality: N/A;  1200  . ESOPHAGOGASTRODUODENOSCOPY N/A 06/11/2012   Procedure: ESOPHAGOGASTRODUODENOSCOPY (EGD);  Surgeon: Rogene Houston, MD;  Location: [facility];  Service: Endoscopy;  Laterality: N/A;  1200  FYI patient is 400 pounds  . ESOPHAGOGASTRODUODENOSCOPY (EGD) WITH PROPOFOL N/A 04/21/2014   Procedure: ESOPHAGOGASTRODUODENOSCOPY (EGD) WITH PROPOFOL;  Surgeon: Rogene Houston, MD;  Location: [facility] ORS;  Service: Endoscopy;  Laterality: N/A;  . HERNIA REPAIR Right    as child; inguinal  . HERNIA REPAIR  sept 2016  . LIVER BIOPSY  2012  . SCIATIC NERVE EXPLORATION    . TONSILLECTOMY      Family History  Problem Relation Age of Onset  . Colon cancer Mother   . Cancer Mother     intestine  . Cancer Father  throat  . Cancer Brother     brain  . Cancer Sister   . Heart attack Neg Hx   . Stroke Neg Hx     Social History   Social History  . Marital status: Divorced    Spouse name: N/A  . Number of children: N/A  . Years of education: N/A   Social History Main Topics  . Smoking status: Never Smoker  . Smokeless tobacco: Current User    Types: Snuff, Chew     Comment: Pt reports that he "dips"  .  Alcohol use No     Comment: stopped drinking 2-3 yrs ago.   . Drug use: No     Comment: Use to smoke cocaine and marijuana. No IV drug use  . Sexual activity: Yes   Other Topics Concern  . None   Social History Narrative  . None     PHYSICAL EXAMINATION  ECOG PERFORMANCE STATUS: 1 - Symptomatic but completely ambulatory  Vitals:   09/29/15 0800  BP: (!) 145/82  Pulse: 79  Resp: 20  Temp: 98 F (36.7 C)    GENERAL:alert, no distress, cooperative, obese and flat affect, malodorous body odor. SKIN: skin color, texture, turgor are normal, no rashes or significant lesions HEAD: Normocephalic, No masses, lesions, tenderness or abnormalities EYES: normal, EOMI, Conjunctiva are pink and non-injected EARS: External ears normal OROPHARYNX:lips, buccal mucosa, and tongue normal and mucous membranes are moist  NECK: supple, trachea midline LYMPH:  not examined BREAST:not examined LUNGS: clear to auscultation  HEART: regular rate & rhythm ABDOMEN:abdomen soft, obese and normal bowel sounds BACK: Back symmetric, no curvature. EXTREMITIES:less then 2 second capillary refill, no joint deformities, effusion, or inflammation, no skin discoloration, no cyanosis  NEURO: alert & oriented x 3 with fluent speech, no focal motor/sensory deficits, gait normal   LABORATORY DATA: CBC    Component Value Date/Time   WBC 1.5 (L) 09/29/2015 0911   RBC 4.61 09/29/2015 0911   HGB 13.0 09/29/2015 0911   HCT 38.3 (L) 09/29/2015 0911   HCT 40.3 07/11/2015 0916   PLT 38 (L) 09/29/2015 0911   PLT 50 (LL) 07/11/2015 0916   MCV 83.1 09/29/2015 0911   MCV 85 07/11/2015 0916   MCH 28.2 09/29/2015 0911   MCHC 33.9 09/29/2015 0911   RDW 14.4 09/29/2015 0911   RDW 15.6 (H) 07/11/2015 0916   LYMPHSABS 0.6 (L) 09/29/2015 0911   LYMPHSABS 0.8 07/11/2015 0916   MONOABS 0.1 09/29/2015 0911   EOSABS 0.1 09/29/2015 0911   EOSABS 0.2 07/11/2015 0916   BASOSABS 0.0 09/29/2015 0911   BASOSABS 0.1  07/11/2015 0916      Chemistry      Component Value Date/Time   NA 136 09/29/2015 0912   NA 136 07/07/2015 1044   K 4.0 09/29/2015 0912   CL 106 09/29/2015 0912   CO2 23 09/29/2015 0912   BUN 12 09/29/2015 0912   BUN 13 07/07/2015 1044   CREATININE 0.80 09/29/2015 0912   CREATININE 0.67 (L) 09/06/2014 0820      Component Value Date/Time   CALCIUM 9.0 09/29/2015 0912   ALKPHOS 64 07/07/2015 1044   AST 34 07/07/2015 1044   ALT 28 07/07/2015 1044   BILITOT 0.8 07/07/2015 1044        PENDING LABS:   RADIOGRAPHIC STUDIES:  No results found.   PATHOLOGY:    ASSESSMENT AND PLAN:  Leukopenia Long standing leukopenia/thrombocytopenia secondary to cirrhosis due to EtOH and possible NAFLD and splenomegaly.  Previous labs reviewed: CBC diff, anemia panel.  I personally reviewed and went over laboratory results with the patient.  The results are noted within this dictation.  No vitamin deficiency is noted.  Peripheral flow cytometry was cancelled by pathology as smear review was unimpressive.  Labs today: CBC diff.  Labs today demonstrate a WBC and platelet count below baseline.  As a result, I will get him set-up for bone marrow aspiration and biopsy.  Since lab results are reported after patient has left, I will ask nurse navigator to call patient and explain this procedure and reasoning for this test.  CT biopsy is ordered by IR.  Labs in 3 months: CBC diff, ferritin, renal function panel.  Return in 3 months for follow-up.    ORDERS PLACED FOR THIS ENCOUNTER: Orders Placed This Encounter  Procedures  . CT Biopsy  . CBC with Differential  . CBC with Differential  . Ferritin  . Renal function panel    MEDICATIONS PRESCRIBED THIS ENCOUNTER: Meds ordered this encounter  Medications  . Influenza vac split quadrivalent PF (FLUARIX) injection 0.5 mL  . ALPRAZolam (XANAX) 0.5 MG tablet  . diltiazem (TIAZAC) 300 MG 24 hr capsule  . NARCAN 4 MG/0.1ML LIQD     THERAPY PLAN:  Ongoing observation.  All questions were answered. The patient knows to call the clinic with any problems, questions or concerns. We can certainly see the patient much sooner if necessary.  Patient and plan discussed with Dr. Ancil Linsey and she is in agreement with the aforementioned.   This note is electronically signed by: Doy Mince 09/29/2015 5:51 PM

## 2015-09-29 NOTE — Patient Instructions (Signed)
[place] at [facility] Discharge Instructions  RECOMMENDATIONS MADE BY THE CONSULTANT AND ANY TEST RESULTS WILL BE SENT TO YOUR REFERRING PHYSICIAN.  You were seen by Gershon Mussel today. Labs today Return to center for follow up in 3 months with labs   Thank you for choosing [place] at [facility] to provide your oncology and hematology care.  To afford each patient quality time with our provider, please arrive at least 15 minutes before your scheduled appointment time.   Beginning January 23rd 2017 lab work for the [facility] will be done in the  Main lab at [facility] on 1st floor. If you have a lab appointment with the [facility] please come in thru the  Main Entrance and check in at the main information desk  You need to re-schedule your appointment should you arrive 10 or more minutes late.  We strive to give you quality time with our providers, and arriving late affects you and other patients whose appointments are after yours.  Also, if you no show three or more times for appointments you may be dismissed from the clinic at the providers discretion.     Again, thank you for choosing [facility].  Our hope is that these requests will decrease the amount of time that you wait before being seen by our physicians.       _____________________________________________________________  Should you have questions after your visit to [facility], please contact our office at [phone] between the hours of 8:30 a.m. and 4:30 p.m.  Voicemails left after 4:30 p.m. will not be returned until the following business day.  For prescription refill requests, have your pharmacy contact our office.         Resources For Cancer Patients and their Caregivers ? American Cancer Society: Can assist with transportation, wigs, general needs, runs Look Good Feel Better.        5412272247 ? Cancer Care: Provides financial  assistance, online support groups, medication/co-pay assistance.  1-800-813-HOPE 863-122-1729) ? [facility] Assists [place] cancer patients and their families through emotional , educational and financial support.  310 276 4413 ? [place] DSS Where to apply for food stamps, Medicaid and utility assistance. 915 678 8587 ? [employer]: Transportation to medical appointments. 4100663013 ? Social Security Administration: May apply for disability if have a Stage IV cancer. (314) 001-9473 351-392-3740 ? [employer], Disability and Transit Services: Assists with nutrition, care and transit needs. [place] Support Programs: @10RELATIVEDAYS @ > Cancer Support Group  2nd Tuesday of the month 1pm-2pm, Journey Room  > Creative Journey  3rd Tuesday of the month 1130am-1pm, Journey Room  > Look Good Feel Better  1st Wednesday of the month 10am-12 noon, Journey Room (Call [place] to register [phone])

## 2015-09-30 NOTE — Progress Notes (Signed)
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 Flu shot has been discontinued.

## 2015-10-06 ENCOUNTER — Telehealth: Payer: Self-pay | Admitting: Family Medicine

## 2015-10-06 NOTE — Telephone Encounter (Signed)
Please address

## 2015-10-06 NOTE — Telephone Encounter (Signed)
Pt does not need a new referral, he need sto call and ask [facility] and ask to be seen in [place].   [name], MD [facility] 10/06/2015, 3:09 PM

## 2015-10-07 NOTE — Telephone Encounter (Signed)
Ill work it off old referral  Thanks

## 2015-10-10 ENCOUNTER — Telehealth (HOSPITAL_COMMUNITY): Payer: Self-pay | Admitting: Emergency Medicine

## 2015-10-10 NOTE — Telephone Encounter (Signed)
Called an talked with pt about lab work and bone marrow biopsy.  Explained what the biopsy entailed.  That this procedure was completed in [place], he would need a driver, they would place an IV, and give him light sedation.  This is needed to investigate his blood work further.  Pt verbalized understanding.

## 2015-10-11 ENCOUNTER — Telehealth (HOSPITAL_COMMUNITY): Payer: Self-pay | Admitting: Emergency Medicine

## 2015-10-11 ENCOUNTER — Telehealth (HOSPITAL_COMMUNITY): Payer: Self-pay | Admitting: *Deleted

## 2015-10-11 DIAGNOSIS — M48061 Spinal stenosis, lumbar region without neurogenic claudication: Secondary | ICD-10-CM | POA: Diagnosis not present

## 2015-10-11 DIAGNOSIS — M1711 Unilateral primary osteoarthritis, right knee: Secondary | ICD-10-CM | POA: Diagnosis not present

## 2015-10-11 DIAGNOSIS — G894 Chronic pain syndrome: Secondary | ICD-10-CM | POA: Diagnosis not present

## 2015-10-11 DIAGNOSIS — Z79891 Long term (current) use of opiate analgesic: Secondary | ICD-10-CM | POA: Diagnosis not present

## 2015-10-11 NOTE — Telephone Encounter (Signed)
Called to make sure pt knew about his bone marrow biopsy appt.  Pt said he did they called to schedule it this morning.

## 2015-10-17 ENCOUNTER — Ambulatory Visit (INDEPENDENT_AMBULATORY_CARE_PROVIDER_SITE_OTHER): Payer: Medicare HMO | Admitting: Pharmacist

## 2015-10-17 DIAGNOSIS — I81 Portal vein thrombosis: Secondary | ICD-10-CM | POA: Diagnosis not present

## 2015-10-17 DIAGNOSIS — Z23 Encounter for immunization: Secondary | ICD-10-CM | POA: Diagnosis not present

## 2015-10-17 DIAGNOSIS — Z7901 Long term (current) use of anticoagulants: Secondary | ICD-10-CM

## 2015-10-17 DIAGNOSIS — Z86718 Personal history of other venous thrombosis and embolism: Secondary | ICD-10-CM | POA: Diagnosis not present

## 2015-10-17 LAB — COAGUCHEK XS/INR WAIVED
INR: 1.8 — AB (ref 0.9–1.1)
PROTHROMBIN TIME: 21.4 s

## 2015-10-21 ENCOUNTER — Other Ambulatory Visit: Payer: Self-pay | Admitting: Radiology

## 2015-10-24 ENCOUNTER — Other Ambulatory Visit: Payer: Self-pay | Admitting: Pharmacist

## 2015-10-24 ENCOUNTER — Encounter (HOSPITAL_COMMUNITY): Payer: Self-pay

## 2015-10-24 ENCOUNTER — Ambulatory Visit (HOSPITAL_COMMUNITY)
Admission: RE | Admit: 2015-10-24 | Discharge: 2015-10-24 | Disposition: A | Discharge status: Home / Self Care | Payer: Medicare HMO | Source: Ambulatory Visit | Attending: Oncology | Admitting: Oncology

## 2015-10-24 DIAGNOSIS — Z9049 Acquired absence of other specified parts of digestive tract: Secondary | ICD-10-CM | POA: Insufficient documentation

## 2015-10-24 DIAGNOSIS — D72819 Decreased white blood cell count, unspecified: Secondary | ICD-10-CM | POA: Diagnosis not present

## 2015-10-24 DIAGNOSIS — Z7984 Long term (current) use of oral hypoglycemic drugs: Secondary | ICD-10-CM | POA: Diagnosis not present

## 2015-10-24 DIAGNOSIS — I4891 Unspecified atrial fibrillation: Secondary | ICD-10-CM | POA: Diagnosis not present

## 2015-10-24 DIAGNOSIS — M109 Gout, unspecified: Secondary | ICD-10-CM | POA: Insufficient documentation

## 2015-10-24 DIAGNOSIS — D61818 Other pancytopenia: Secondary | ICD-10-CM | POA: Diagnosis not present

## 2015-10-24 DIAGNOSIS — Z885 Allergy status to narcotic agent status: Secondary | ICD-10-CM | POA: Insufficient documentation

## 2015-10-24 DIAGNOSIS — D7589 Other specified diseases of blood and blood-forming organs: Secondary | ICD-10-CM | POA: Diagnosis not present

## 2015-10-24 DIAGNOSIS — I509 Heart failure, unspecified: Secondary | ICD-10-CM | POA: Diagnosis not present

## 2015-10-24 DIAGNOSIS — M069 Rheumatoid arthritis, unspecified: Secondary | ICD-10-CM | POA: Diagnosis not present

## 2015-10-24 DIAGNOSIS — Z7982 Long term (current) use of aspirin: Secondary | ICD-10-CM | POA: Insufficient documentation

## 2015-10-24 DIAGNOSIS — F419 Anxiety disorder, unspecified: Secondary | ICD-10-CM | POA: Insufficient documentation

## 2015-10-24 DIAGNOSIS — K7460 Unspecified cirrhosis of liver: Secondary | ICD-10-CM | POA: Insufficient documentation

## 2015-10-24 DIAGNOSIS — E669 Obesity, unspecified: Secondary | ICD-10-CM | POA: Diagnosis not present

## 2015-10-24 DIAGNOSIS — D696 Thrombocytopenia, unspecified: Secondary | ICD-10-CM | POA: Insufficient documentation

## 2015-10-24 DIAGNOSIS — I8290 Acute embolism and thrombosis of unspecified vein: Secondary | ICD-10-CM

## 2015-10-24 DIAGNOSIS — Z888 Allergy status to other drugs, medicaments and biological substances status: Secondary | ICD-10-CM | POA: Diagnosis not present

## 2015-10-24 DIAGNOSIS — G4733 Obstructive sleep apnea (adult) (pediatric): Secondary | ICD-10-CM | POA: Diagnosis not present

## 2015-10-24 DIAGNOSIS — Z7901 Long term (current) use of anticoagulants: Secondary | ICD-10-CM | POA: Insufficient documentation

## 2015-10-24 DIAGNOSIS — E119 Type 2 diabetes mellitus without complications: Secondary | ICD-10-CM | POA: Insufficient documentation

## 2015-10-24 DIAGNOSIS — L409 Psoriasis, unspecified: Secondary | ICD-10-CM | POA: Diagnosis not present

## 2015-10-24 DIAGNOSIS — J449 Chronic obstructive pulmonary disease, unspecified: Secondary | ICD-10-CM | POA: Diagnosis not present

## 2015-10-24 DIAGNOSIS — K219 Gastro-esophageal reflux disease without esophagitis: Secondary | ICD-10-CM | POA: Diagnosis not present

## 2015-10-24 LAB — CBC WITH DIFFERENTIAL/PLATELET
BASOS ABS: 0 10*3/uL (ref 0.0–0.1)
BASOS PCT: 1 %
EOS ABS: 0.1 10*3/uL (ref 0.0–0.7)
Eosinophils Relative: 5 %
HCT: 33.9 % — ABNORMAL LOW (ref 39.0–52.0)
Hemoglobin: 11.7 g/dL — ABNORMAL LOW (ref 13.0–17.0)
LYMPHS ABS: 0.5 10*3/uL — AB (ref 0.7–4.0)
Lymphocytes Relative: 38 %
MCH: 28.1 pg (ref 26.0–34.0)
MCHC: 34.5 g/dL (ref 30.0–36.0)
MCV: 81.3 fL (ref 78.0–100.0)
MONO ABS: 0.1 10*3/uL (ref 0.1–1.0)
Monocytes Relative: 6 %
NEUTROS ABS: 0.6 10*3/uL — AB (ref 1.7–7.7)
Neutrophils Relative %: 50 %
PLATELETS: 46 10*3/uL — AB (ref 150–400)
RBC: 4.17 MIL/uL — ABNORMAL LOW (ref 4.22–5.81)
RDW: 14.7 % (ref 11.5–15.5)
WBC: 1.3 10*3/uL — CL (ref 4.0–10.5)

## 2015-10-24 LAB — PROTIME-INR
INR: 2.23
PROTHROMBIN TIME: 25.1 s — AB (ref 11.4–15.2)

## 2015-10-24 LAB — GLUCOSE, CAPILLARY: Glucose-Capillary: 111 mg/dL — ABNORMAL HIGH (ref 65–99)

## 2015-10-24 LAB — BONE MARROW EXAM

## 2015-10-24 MED ORDER — SODIUM CHLORIDE 0.9 % IV SOLN
INTRAVENOUS | Status: DC
  Administered 2015-10-24: 10:00:00 via INTRAVENOUS

## 2015-10-24 MED ORDER — FENTANYL CITRATE (PF) 100 MCG/2ML IJ SOLN
INTRAMUSCULAR | Status: AC
  Filled 2015-10-24: qty 4

## 2015-10-24 MED ORDER — MIDAZOLAM HCL 2 MG/2ML IJ SOLN
INTRAMUSCULAR | Status: AC | PRN
  Administered 2015-10-24: 1 mg via INTRAVENOUS
  Administered 2015-10-24: 2 mg via INTRAVENOUS
  Administered 2015-10-24 (×5): 1 mg via INTRAVENOUS

## 2015-10-24 MED ORDER — FENTANYL CITRATE (PF) 100 MCG/2ML IJ SOLN
INTRAMUSCULAR | Status: AC | PRN
  Administered 2015-10-24: 50 ug via INTRAVENOUS
  Administered 2015-10-24: 25 ug via INTRAVENOUS
  Administered 2015-10-24 (×2): 50 ug via INTRAVENOUS
  Administered 2015-10-24: 25 ug via INTRAVENOUS

## 2015-10-24 MED ORDER — MIDAZOLAM HCL 2 MG/2ML IJ SOLN
INTRAMUSCULAR | Status: AC
  Filled 2015-10-24: qty 8

## 2015-10-24 NOTE — Progress Notes (Signed)
CRITICAL VALUE ALERT  Critical value received:  WBC 1.3  Date of notification:  10/24/15  Time of notification:  10:09  Critical value read back: yes  Nurse who received alert:  Lavon Paganini, RN  MD notified (1st page):  Rowe Robert, PA  Time of first page:  10:09  MD notified (2nd page):  Time of second page:  Responding MD:  Rowe Robert, PA  Time MD responded:  10:09

## 2015-10-24 NOTE — Procedures (Signed)
Interventional Radiology Procedure Note  Procedure: CT guided aspirate and core biopsy of right iliac bone Complications: None Recommendations: - Bedrest supine x 1 hrs - Hydrocodone PRN  Pain - Follow biopsy results  Signed,  Criselda Peaches, MD

## 2015-10-24 NOTE — Progress Notes (Signed)
Referring Physician(s): Penland,S  Supervising Physician: Jacqulynn Cadet  Patient Status:  outpatient  Chief Complaint:  "I'm here for a bone marrow test"  Subjective:  Pt familiar to IR service from prior random core liver biopsy in 2012. He has a history of cirrhosis, OSA,RA, psoriasis, gout, DM, COPD,CHF,afib, obesity and worsening leukopenia/thrombocytopenia. He presents again today for CT guided bone marrow biopsy for further evaluation. He denies fever,HA,CP, N/V or bleeding. He does have some dyspnea with exertion, occ cough, abd/back pain. Past Medical History:  Diagnosis Date  . Anxiety   . Atrial fibrillation ([place])   . CHF (congestive heart failure) ([place])   . Cirrhosis ([place])   . Cirrhosis ([place])    NASH  . Constipation   . COPD (chronic obstructive pulmonary disease) ([place])    with cpap  . Diabetes mellitus    type 2  . Genu varus   . GERD (gastroesophageal reflux disease)   . Gout   . Heart murmur   . Leukopenia 07/08/2015  . Neuromuscular disorder (HCC)    neuropathy in hands and feet  . Psoriasis   . RA (rheumatoid arthritis) ([place])   . Sleep apnea    cpap used- level 10 and greater  . Thrombocytopenia ([place])    Past Surgical History:  Procedure Laterality Date  . ANKLE SURGERY     right ankle talor repair  . CHOLECYSTECTOMY  sept 2016  . COLONOSCOPY  05/08/2011   Procedure: COLONOSCOPY;  Surgeon: Rogene Houston, MD;  [facility];  Service: Endoscopy;  Laterality: N/A;  730  . ESOPHAGOGASTRODUODENOSCOPY  02/28/2011   Procedure: ESOPHAGOGASTRODUODENOSCOPY (EGD);  Surgeon: Rogene Houston, MD;  [facility];  Service: Endoscopy;  Laterality: N/A;  1200  . ESOPHAGOGASTRODUODENOSCOPY N/A 06/11/2012   Procedure: ESOPHAGOGASTRODUODENOSCOPY (EGD);  Surgeon: Rogene Houston, MD;  [facility]: [facility];  Service: Endoscopy;  Laterality: N/A;  1200  FYI patient is 400 pounds  . ESOPHAGOGASTRODUODENOSCOPY (EGD) WITH PROPOFOL N/A 04/21/2014    Procedure: ESOPHAGOGASTRODUODENOSCOPY (EGD) WITH PROPOFOL;  Surgeon: Rogene Houston, MD;  Location: [facility] ORS;  Service: Endoscopy;  Laterality: N/A;  . HERNIA REPAIR Right    as child; inguinal  . HERNIA REPAIR  sept 2016  . LIVER BIOPSY  2012  . SCIATIC NERVE EXPLORATION    . TONSILLECTOMY       Allergies: Oxycodone base and Prozac [fluoxetine hcl]  Medications: Prior to Admission medications   Medication Sig Start Date End Date Taking? Authorizing Provider  ALPRAZolam Duanne Moron) 0.5 MG tablet  09/26/15  Yes Historical Provider, MD  colchicine (COLCRYS) 0.6 MG tablet Take 1 tablet (0.6 mg total) by mouth 2 (two) times daily as needed. 09/19/15  Yes Timmothy Euler, MD  diltiazem (TIAZAC) 300 MG 24 hr capsule Take 300 mg by mouth daily.  09/26/15  Yes Historical Provider, MD  doxepin (SINEQUAN) 10 MG capsule Take 1 capsule (10 mg total) by mouth at bedtime as needed. 07/07/15  Yes Timmothy Euler, MD  esomeprazole (NEXIUM) 40 MG capsule Take 1 capsule (40 mg total) by mouth daily. 07/07/15  Yes Timmothy Euler, MD  ezetimibe (ZETIA) 10 MG tablet Take 1 tablet (10 mg total) by mouth daily. 07/07/15  Yes Timmothy Euler, MD  fluticasone (FLONASE) 50 MCG/ACT nasal spray Place 2 sprays into both nostrils daily as needed. For allergies 07/07/15  Yes Timmothy Euler, MD  folic acid (FOLVITE) 027 MCG tablet Take 400 mcg by mouth daily.  Yes Historical Provider, MD  gabapentin (NEURONTIN) 400 MG capsule Take 400 mg by mouth 3 (three) times daily.   Yes Historical Provider, MD  indomethacin (INDOCIN) 50 MG capsule Take 50 mg by mouth 3 (three) times daily as needed.   Yes Historical Provider, MD  ipratropium (ATROVENT) 0.02 % nebulizer solution Take 2.5 mLs (500 mcg total) by nebulization 4 (four) times daily as needed. For wheezing 07/07/15  Yes Timmothy Euler, MD  lactulose (CHRONULAC) 10 GM/15ML solution Take 30 mLs (20 g total) by mouth 2 (two) times daily. 06/01/15  Yes Butch Penny, NP    metFORMIN (GLUCOPHAGE) 500 MG tablet Take 1 tablet (500 mg total) by mouth 2 (two) times daily with a meal. 09/19/15  Yes Tammy Eckard, PharmD  metolazone (ZAROXOLYN) 2.5 MG tablet TAKE 1 TABLET DAILY 1 1/2 HOUR PRIOR TO TORSEMIDE FOR 3 DAYS AS NEEDED FOR SEVERE SWELLING 07/07/15  Yes Timmothy Euler, MD  milk thistle 175 MG tablet Take 175 mg by mouth 4 (four) times daily.    Yes Historical Provider, MD  morphine (MS CONTIN) 30 MG 12 hr tablet Take 1 tablet by mouth 2 (two) times daily. 08/09/15  Yes Historical Provider, MD  morphine (MSIR) 15 MG tablet Take 1 tablet by mouth 2 (two) times daily as needed. 08/09/15  Yes Historical Provider, MD  Multiple Vitamins-Minerals (MULTI COMPLETE PO) Take 1 tablet by mouth daily.    Yes Historical Provider, MD  neomycin-bacitracin-polymyxin (NEOSPORIN) ointment Apply 1 application topically every 12 (twelve) hours as needed. Reported on 06/01/2015   Yes Historical Provider, MD  phenylephrine (NASAL DECONGESTANT) 1 % nasal spray Place 1 drop into the nose every 4 (four) hours as needed. For allergies   Yes Historical Provider, MD  potassium chloride SA (K-DUR,KLOR-CON) 20 MEQ tablet Take 1 tablet (20 mEq total) by mouth 2 (two) times daily. 07/07/15  Yes Timmothy Euler, MD  Testosterone (ANDROGEL) 20.25 MG/1.25GM (1.62%) GEL Place onto the skin.   Yes Historical Provider, MD  torsemide (DEMADEX) 20 MG tablet Take 1 tablet (20 mg total) by mouth 3 (three) times daily. 07/07/15  Yes Timmothy Euler, MD  VOLTAREN 1 % GEL  09/10/15  Yes Historical Provider, MD  warfarin (COUMADIN) 2.5 MG tablet Take 1.5 tablets (3.75 mg total) by mouth daily. 08/31/15  Yes Cherre Robins, PharmD  albuterol (PROAIR HFA) 108 (90 Base) MCG/ACT inhaler Inhale 2 puffs into the lungs every 6 (six) hours as needed for wheezing or shortness of breath. 07/07/15   Timmothy Euler, MD  aspirin EC 81 MG tablet Take 81 mg by mouth every 6 (six) hours as needed (CHEST PAIN).    Historical Provider,  MD  betamethasone dipropionate (DIPROLENE) 0.05 % cream APPLY TO AFFECTED AREA TWICE A DAY 01/22/14   Lysbeth Penner, FNP  diltiazem (CARDIZEM SR) 90 MG 12 hr capsule Take 1 capsule (90 mg total) by mouth daily as needed (palpitations). 06/08/15   Minus Breeding, MD  glucose blood test strip 1 each by Other route as needed for other. Use as instructed    Historical Provider, MD  hydrocortisone 1 % ointment Apply 1 application topically 3 (three) times daily as needed. 11/04/13   Lysbeth Penner, FNP  lidocaine (XYLOCAINE) 5 % ointment Apply 1 application topically 4 (four) times daily as needed. To knees and ankle as needed for pain 07/05/14   Sharion Balloon, FNP  Magnesium 400 MG CAPS Take 400 mg by mouth every morning.  07/07/15   Timmothy Euler, MD  Menthol-Methyl Salicylate (MUSCLE RUB) 10-15 % CREA Apply 1 application topically as needed. For muscle pain    Historical Provider, MD  [facility] 4 MG/0.1ML LIQD  09/06/15   Historical Provider, MD  nitroGLYCERIN (NITROSTAT) 0.4 MG SL tablet Place 1 tablet (0.4 mg total) under the tongue every 5 (five) minutes as needed for chest pain (may repeat x3). 06/08/15   Minus Breeding, MD  Omega-3 Fatty Acids (FISH OIL) 1000 MG CAPS Take 1 capsule by mouth daily.     Historical Provider, MD     Vital Signs: BP 133/85 (BP Location: Left Arm)   Pulse 77   Temp 97.7 F (36.5 C) (Oral)   Resp 16   SpO2 97%   Physical Exam awake/alert; chest- distant BS bilat; heart- RRR; abd- obese, soft, midline umbilical hernia, mildly tender; LE- 1-2+ edema , Imaging: No results found.  Labs:  CBC:  Recent Labs  11/16/14 1524 07/07/15 1044 07/11/15 0916 09/02/15 1544 09/29/15 0911  WBC 4.5 2.5* 2.4* 2.3* 1.5*  HGB 13.7  --   --  13.1 13.0  HCT 40.4 41.1 40.3 38.1* 38.3*  PLT 73* 45* 50* 57* 38*    COAGS:  Recent Labs  08/31/15 0850 09/19/15 0819 10/17/15 0804 10/24/15 0928  INR 1.3* 1.6* 1.8* 2.23    BMP:  Recent Labs  07/07/15 1044  09/29/15 0912  NA 136 136  K 3.2* 4.0  CL 95* 106  CO2 23 23  GLUCOSE 108* 129*  BUN 13 12  CALCIUM 9.2 9.0  CREATININE 0.81 0.80  GFRNONAA 103 >60  GFRAA 119 >60    LIVER FUNCTION TESTS:  Recent Labs  11/16/14 1524 07/07/15 1044 09/29/15 0912  BILITOT 0.8 0.8  --   AST 31 34  --   ALT 26 28  --   ALKPHOS 81 64  --   PROT 7.2 6.6  --   ALBUMIN 3.8 4.3 4.1    Assessment and Plan: Pt with history of cirrhosis, OSA,RA, psoriasis, gout, DM, COPD,CHF,afib (on coumadin), obesity and worsening leukopenia/thrombocytopenia. He presents again today for CT guided bone marrow biopsy for further evaluation.Risks and benefits discussed with the patient including, but not limited to bleeding, infection, damage to adjacent structures or low yield requiring additional tests.All of the patient's questions were answered, patient is agreeable to proceed.Consent signed and in chart.     Electronically Signed: D. Rowe Robert 10/24/2015, 9:57 AM   I spent a total of 20 minutes at the the patient's bedside AND on the patient's hospital floor or unit, greater than 50% of which was counseling/coordinating care for CT guided bone marrow biopsy    Patient ID: [name], male   DOB: 01/31/1965, 50 y.o.   MRN: [id number]

## 2015-10-24 NOTE — Discharge Instructions (Signed)
Bone Marrow Aspiration and Bone Marrow Biopsy °Bone marrow aspiration and bone marrow biopsy are procedures that are done to diagnose blood disorders. You may also have one of these procedures to help diagnose infections or some types of cancer. °Bone marrow is the soft tissue that is inside your bones. Blood cells are produced in bone marrow. For bone marrow aspiration, a sample of tissue in liquid form is removed from inside your bone. For a bone marrow biopsy, a small core of bone marrow tissue is removed. Then these samples are examined under a microscope or tested in a lab. °You may need these procedures if you have an abnormal complete blood count (CBC). The aspiration or biopsy sample is usually taken from the top of your hip bone. Sometimes, an aspiration sample is taken from your chest bone (sternum). °LET YOUR HEALTH CARE PROVIDER KNOW ABOUT: °· Any allergies you have. °· All medicines you are taking, including vitamins, herbs, eye drops, creams, and over-the-counter medicines. °· Previous problems you or members of your family have had with the use of anesthetics. °· Any blood disorders you have. °· Previous surgeries you have had. °· Any medical conditions you may have. °· Whether you are pregnant or you think that you may be pregnant. °RISKS AND COMPLICATIONS °Generally, this is a safe procedure. However, problems may occur, including: °· Infection. °· Bleeding. °BEFORE THE PROCEDURE °· Ask your health care provider about: °¨ Changing or stopping your regular medicines. This is especially important if you are taking diabetes medicines or blood thinners. °¨ Taking medicines such as aspirin and ibuprofen. These medicines can thin your blood. Do not take these medicines before your procedure if your health care provider instructs you not to. °· Plan to have someone take you home after the procedure. °· If you go home right after the procedure, plan to have someone with you for 24 hours. °PROCEDURE  °· An  IV tube may be inserted into one of your veins. °· The injection site will be cleaned with a germ-killing solution (antiseptic). °· You will be given one or more of the following: °¨ A medicine that helps you relax (sedative). °¨ A medicine that numbs the area (local anesthetic). °· The bone marrow sample will be removed as follows: °¨ For an aspiration, a hollow needle will be inserted through your skin and into your bone. Bone marrow fluid will be drawn up into a syringe. °¨ For a biopsy, your health care provider will use a hollow needle to remove a core of tissue from your bone marrow. °· The needle will be removed. °· A bandage (dressing) will be placed over the insertion site and taped in place. °The procedure may vary among health care providers and hospitals. °AFTER THE PROCEDURE °· Your blood pressure, heart rate, breathing rate, and blood oxygen level will be monitored often until the medicines you were given have worn off. °· Return to your normal activities as directed by your health care provider. °  °This information is not intended to replace advice given to you by your health care provider. Make sure you discuss any questions you have with your health care provider. °  °Document Released: 12/29/2003 Document Revised: 05/11/2014 Document Reviewed: 12/16/2013 °Elsevier Interactive Patient Education ©2016 Elsevier Inc. °Bone Marrow Aspiration and Bone Marrow Biopsy, Care After °Refer to this sheet in the next few weeks. These instructions provide you with information about caring for yourself after your procedure. Your health care provider may also give you   more specific instructions. Your treatment has been planned according to current medical practices, but problems sometimes occur. Call your health care provider if you have any problems or questions after your procedure. WHAT TO EXPECT AFTER THE PROCEDURE After your procedure, it is common to have:  Soreness or tenderness around the puncture  site.  Bruising. HOME CARE INSTRUCTIONS  Take medicines only as directed by your health care provider.  Follow your health care provider's instructions about:  Puncture site care.  Bandage (dressing) changes and removal.  Bathe and shower as directed by your health care provider.  Check your puncture site every day for signs of infection. Watch for:  Redness, swelling, or pain.  Fluid, blood, or pus.  Return to your normal activities as directed by your health care provider.  Keep all follow-up visits as directed by your health care provider. This is important. SEEK MEDICAL CARE IF:  You have a fever.  You have uncontrollable bleeding.  You have redness, swelling, or pain at the site of your puncture.  You have fluid, blood, or pus coming from your puncture site.   This information is not intended to replace advice given to you by your health care provider. Make sure you discuss any questions you have with your health care provider.   Document Released: 07/14/2004 Document Revised: 05/11/2014 Document Reviewed: 12/16/2013 Elsevier Interactive Patient Education 2016 Elsevier Inc. Moderate Conscious Sedation, Adult Sedation is the use of medicines to promote relaxation and relieve discomfort and anxiety. Moderate conscious sedation is a type of sedation. Under moderate conscious sedation you are less alert than normal but are still able to respond to instructions or stimulation. Moderate conscious sedation is used during short medical and dental procedures. It is milder than deep sedation or general anesthesia and allows you to return to your regular activities sooner. LET [facility] CARE PROVIDER KNOW ABOUT:   Any allergies you have.  All medicines you are taking, including vitamins, herbs, eye drops, creams, and over-the-counter medicines.  Use of steroids (by mouth or creams).  Previous problems you or members of your family have had with the use of  anesthetics.  Any blood disorders you have.  Previous surgeries you have had.  Medical conditions you have.  Possibility of pregnancy, if this applies.  Use of cigarettes, alcohol, or illegal drugs. RISKS AND COMPLICATIONS Generally, this is a safe procedure. However, as with any procedure, problems can occur. Possible problems include:  Oversedation.  Trouble breathing on your own. You may need to have a breathing tube until you are awake and breathing on your own.  Allergic reaction to any of the medicines used for the procedure. BEFORE THE PROCEDURE  You may have blood tests done. These tests can help show how well your kidneys and liver are working. They can also show how well your blood clots.  A physical exam will be done.  Only take medicines as directed by your health care provider. You may need to stop taking medicines (such as blood thinners, aspirin, or nonsteroidal anti-inflammatory drugs) before the procedure.   Do not eat or drink at least 6 hours before the procedure or as directed by your health care provider.  Arrange for a responsible adult, family member, or friend to take you home after the procedure. He or she should stay with you for at least 24 hours after the procedure, until the medicine has worn off. PROCEDURE   An intravenous (IV) catheter will be inserted into one of your  veins. Medicine will be able to flow directly into your body through this catheter. You may be given medicine through this tube to help prevent pain and help you relax.  The medical or dental procedure will be done. AFTER THE PROCEDURE  You will stay in a recovery area until the medicine has worn off. Your blood pressure and pulse will be checked.   Depending on the procedure you had, you may be allowed to go home when you can tolerate liquids and your pain is under control.   This information is not intended to replace advice given to you by your health care provider. Make  sure you discuss any questions you have with your health care provider.   Document Released: 09/19/2000 Document Revised: 01/15/2014 Document Reviewed: 09/01/2012 Elsevier Interactive Patient Education [employer].

## 2015-10-27 ENCOUNTER — Other Ambulatory Visit (INDEPENDENT_AMBULATORY_CARE_PROVIDER_SITE_OTHER): Payer: Self-pay | Admitting: Internal Medicine

## 2015-10-27 DIAGNOSIS — R69 Illness, unspecified: Secondary | ICD-10-CM | POA: Diagnosis not present

## 2015-10-28 ENCOUNTER — Telehealth: Payer: Self-pay | Admitting: Family Medicine

## 2015-10-28 NOTE — Telephone Encounter (Signed)
TC to pt - states he is on a limited income & goes to a church's food pantry at times & the put beans in their bags a lot. He tries to limit his intake to 1 to 2 cups a week. He his having a gout flare-up now, is having to take 2 - 3 tabs a day instead of his normal 1 tab a day. Can additional tabs be called into his pharmacy.

## 2015-10-28 NOTE — Telephone Encounter (Signed)
Unclear which med he is referring to.   Overall he needs better gout treatment.   If he is referring to colchicne he can take more, if he is referring to indomethacin he can only take maximum of 3 times daily.   He should probably be seen for gout flare given polypharmacy if he needs any meds except what he already has at home. I can refill colcrys or indomethacin  if he needs too.   [name], MD [facility] 10/28/2015, 2:54 PM

## 2015-10-28 NOTE — Telephone Encounter (Signed)
Pt was calling about refill for Colchicine Pt [facility] Pt notified of refill Verbalizes understanding

## 2015-10-31 ENCOUNTER — Ambulatory Visit (INDEPENDENT_AMBULATORY_CARE_PROVIDER_SITE_OTHER): Payer: Medicare HMO | Admitting: Family Medicine

## 2015-10-31 ENCOUNTER — Telehealth (HOSPITAL_COMMUNITY): Payer: Self-pay | Admitting: *Deleted

## 2015-10-31 ENCOUNTER — Encounter: Payer: Self-pay | Admitting: Family Medicine

## 2015-10-31 ENCOUNTER — Telehealth (HOSPITAL_COMMUNITY): Payer: Self-pay | Admitting: Emergency Medicine

## 2015-10-31 VITALS — BP 135/80 | HR 70 | Temp 97.5°F | Ht 73.83 in | Wt 376.6 lb

## 2015-10-31 DIAGNOSIS — E785 Hyperlipidemia, unspecified: Secondary | ICD-10-CM | POA: Diagnosis not present

## 2015-10-31 DIAGNOSIS — M1A9XX0 Chronic gout, unspecified, without tophus (tophi): Secondary | ICD-10-CM

## 2015-10-31 DIAGNOSIS — E1169 Type 2 diabetes mellitus with other specified complication: Secondary | ICD-10-CM | POA: Insufficient documentation

## 2015-10-31 DIAGNOSIS — I81 Portal vein thrombosis: Secondary | ICD-10-CM | POA: Diagnosis not present

## 2015-10-31 DIAGNOSIS — K429 Umbilical hernia without obstruction or gangrene: Secondary | ICD-10-CM | POA: Diagnosis not present

## 2015-10-31 DIAGNOSIS — E119 Type 2 diabetes mellitus without complications: Secondary | ICD-10-CM | POA: Diagnosis not present

## 2015-10-31 DIAGNOSIS — E782 Mixed hyperlipidemia: Secondary | ICD-10-CM | POA: Insufficient documentation

## 2015-10-31 LAB — BAYER DCA HB A1C WAIVED: HB A1C: 5.2 % (ref <7.0)

## 2015-10-31 MED ORDER — FEBUXOSTAT 40 MG PO TABS: 40.0000 mg | ORAL_TABLET | Freq: Every day | ORAL | 11 refills | Status: DC

## 2015-10-31 NOTE — Patient Instructions (Signed)
Great to see you!  Stop indomethacin   For Gout:  Start uloric everyday, take colchicine twice daily for 3 months  For diabetes Take metformin 1 pill twice daily  Come back in 1 month

## 2015-10-31 NOTE — Progress Notes (Signed)
   HPI  Patient presents today for follow-up for gout, type 2 diabetes, and hyperlipidemia.  Type 2 diabetes Checking blood sugar randomly throughout the day, ranging 110-150 after eating Metformin complaints is only when necessary. No hypoglycemia - no hypoglycemic agents either.  Gout Intermittent frequent issues. Takes indomethacin and colchicine for 3-4 days whenever layers occur with good improvement. Has been taking indomethacin for years along with imminent for A. fib.  Umbilical hernia Seems to be enlarging, however not painful  Hyperlipidemia Reports good medication compliance Not watching his diet, he has financial difficulty and has to eat whatever he can get at a local pantry   PMH: Smoking status noted ROS: Per HPI  Objective: BP 135/80   Pulse 70   Temp 97.5 F (36.4 C) (Oral)   Ht 6' 1.83" (1.875 m)   Wt (!) 376 lb 9.6 oz (170.8 kg)   BMI 48.58 kg/m  Gen: NAD, alert, cooperative with exam HEENT: NCAT CV: RRR, good S1/S2, no murmur Resp: CTABL, no wheezes, non-labored Abd: Soft, large umbilical hernia with no tenderness to palpation, small amount of heme crusting on rim of the umbilicus without any fresh blood or discharge Ext: No edema, warm Neuro: Alert and oriented, No gross deficits  Assessment and plan:  # Type 2 diabetes A1c 5.2 today, controlled Discussed daily metformin dosing, this may help his weight  # Gout Significant issue, uncontrolled Recommended discontinuing indomethacin, I instructed the patient to throw it out , I tried to explain very directly how dangerous it is to combine NSAIDs and Coumadin. Start Uloric daily, take colchicine twice daily for 3 months while starting   # Billable hernia No signs of incarceration, patient understands red flags He does have a small amount of heme crusting at the navel, signs of infection or wound Patient has been cleaning vigorously including with peroxide, discussed only rinsing with water,  and crusting possibly from trauma from cleaning  # Hyperlipidemia Repeating labs Continue Zetia     Orders Placed This Encounter  Procedures  . Bayer DCA Hb A1c Waived  . Lipid panel  . CMP14+EGFR  . Uric acid    Meds ordered this encounter  Medications  . febuxostat (ULORIC) 40 MG tablet    Sig: Take 1 tablet (40 mg total) by mouth daily.    Dispense:  3 tablet    Refill:  [place], MD [facility] 10/31/2015, 8:21 AM

## 2015-10-31 NOTE — Telephone Encounter (Signed)
Called pt to let him know that the BMBX results would not completely be back until 2 weeks.  I made pt a follow up appt so they could be discussed.  11/15/2015 at 8:30 am.  Pt verbalized understanding.

## 2015-10-31 NOTE — Telephone Encounter (Signed)
Left pt a message to call me

## 2015-11-01 ENCOUNTER — Other Ambulatory Visit: Payer: Self-pay | Admitting: Family Medicine

## 2015-11-01 LAB — URIC ACID: Uric Acid: 11.8 mg/dL — ABNORMAL HIGH (ref 3.7–8.6)

## 2015-11-01 LAB — CMP14+EGFR
ALT: 35 IU/L (ref 0–44)
AST: 60 IU/L — ABNORMAL HIGH (ref 0–40)
Albumin/Globulin Ratio: 1.6 (ref 1.2–2.2)
Albumin: 4.4 g/dL (ref 3.5–5.5)
Alkaline Phosphatase: 84 IU/L (ref 39–117)
BUN/Creatinine Ratio: 15 (ref 9–20)
BUN: 13 mg/dL (ref 6–24)
Bilirubin Total: 0.9 mg/dL (ref 0.0–1.2)
CALCIUM: 9.3 mg/dL (ref 8.7–10.2)
CO2: 27 mmol/L (ref 18–29)
CREATININE: 0.88 mg/dL (ref 0.76–1.27)
Chloride: 89 mmol/L — ABNORMAL LOW (ref 96–106)
GFR, EST AFRICAN AMERICAN: 114 mL/min/{1.73_m2} (ref >59)
GFR, EST NON AFRICAN AMERICAN: 99 mL/min/{1.73_m2} (ref >59)
Globulin, Total: 2.7 g/dL (ref 1.5–4.5)
Glucose: 103 mg/dL — ABNORMAL HIGH (ref 65–99)
Potassium: 3.2 mmol/L — ABNORMAL LOW (ref 3.5–5.2)
Sodium: 135 mmol/L (ref 134–144)
TOTAL PROTEIN: 7.1 g/dL (ref 6.0–8.5)

## 2015-11-01 LAB — LIPID PANEL
Chol/HDL Ratio: 4.7 ratio units (ref 0.0–5.0)
Cholesterol, Total: 142 mg/dL (ref 100–199)
HDL: 30 mg/dL — ABNORMAL LOW (ref >39)
LDL Calculated: 66 mg/dL (ref 0–99)
Triglycerides: 230 mg/dL — ABNORMAL HIGH (ref 0–149)
VLDL CHOLESTEROL CAL: 46 mg/dL — AB (ref 5–40)

## 2015-11-01 MED ORDER — POTASSIUM CHLORIDE CRYS ER 20 MEQ PO TBCR: 20.0000 meq | EXTENDED_RELEASE_TABLET | Freq: Two times a day (BID) | ORAL | 6 refills | Status: DC

## 2015-11-02 ENCOUNTER — Telehealth: Payer: Self-pay | Admitting: Family Medicine

## 2015-11-02 LAB — CHROMOSOME ANALYSIS, BONE MARROW

## 2015-11-02 NOTE — Telephone Encounter (Signed)
FYI

## 2015-11-02 NOTE — Telephone Encounter (Signed)
Often with Uloric the gout can flare up at the beginning.  For today, hold the med, let Dr. Wendi Snipes see this on Thursday and direct him from there.

## 2015-11-02 NOTE — Telephone Encounter (Signed)
Patient states that his Uloric was not covered by his insurance and was $300. Patient states that he had a old rx for Uloric and took it last night and states that it gave him gout. Patient states that he went to sleep with no gout and woke up with gout. He would like to know if something else can be called in besides Uloric or allopurinol since they both give him gout. Please advise, covering PCP.

## 2015-11-03 ENCOUNTER — Telehealth (HOSPITAL_COMMUNITY): Payer: Self-pay | Admitting: Hematology & Oncology

## 2015-11-03 MED ORDER — ALLOPURINOL 300 MG PO TABS: ORAL_TABLET | ORAL | 2 refills | Status: DC

## 2015-11-03 NOTE — Telephone Encounter (Signed)
Uloric and allopurinol will help prevent gout in the long term but will cause falres in the short term which is why I instructed the patient ( severe gout) to take colchicine twice daily while starting.    If uloric is unaffordable I will ask him to to start allopurinol, knowing this may cause his blood to thin, I would like him to come back to see the clinical pharmacist for coumadin dosing in 1 week.   Again, take colchicine twice daily while starting allopurinol.   Take 1/2 tablet for 2 weeks then increase to 1 whole tablet daily.   [name], MD [facility] 11/03/2015, 7:48 AM

## 2015-11-03 NOTE — Telephone Encounter (Signed)
Patient aware and verbalizes understanding. 

## 2015-11-03 NOTE — Telephone Encounter (Signed)
Mailed [facility] vouchers to pt for his visit on 11/15/15

## 2015-11-09 ENCOUNTER — Telehealth: Payer: Self-pay | Admitting: Family Medicine

## 2015-11-09 NOTE — Telephone Encounter (Signed)
Patient states he will discuss with Tammy at next visit.

## 2015-11-10 DIAGNOSIS — G4733 Obstructive sleep apnea (adult) (pediatric): Secondary | ICD-10-CM | POA: Diagnosis not present

## 2015-11-10 DIAGNOSIS — I10 Essential (primary) hypertension: Secondary | ICD-10-CM | POA: Diagnosis not present

## 2015-11-10 DIAGNOSIS — I5032 Chronic diastolic (congestive) heart failure: Secondary | ICD-10-CM | POA: Diagnosis not present

## 2015-11-10 DIAGNOSIS — E662 Morbid (severe) obesity with alveolar hypoventilation: Secondary | ICD-10-CM | POA: Diagnosis not present

## 2015-11-11 ENCOUNTER — Ambulatory Visit (HOSPITAL_COMMUNITY): Payer: Medicare HMO | Admitting: Oncology

## 2015-11-14 ENCOUNTER — Ambulatory Visit (INDEPENDENT_AMBULATORY_CARE_PROVIDER_SITE_OTHER): Payer: Medicare HMO | Admitting: Pharmacist

## 2015-11-14 ENCOUNTER — Ambulatory Visit (HOSPITAL_COMMUNITY): Payer: Medicare HMO | Admitting: Oncology

## 2015-11-14 DIAGNOSIS — M179 Osteoarthritis of knee, unspecified: Secondary | ICD-10-CM | POA: Diagnosis not present

## 2015-11-14 DIAGNOSIS — Z86718 Personal history of other venous thrombosis and embolism: Secondary | ICD-10-CM | POA: Diagnosis not present

## 2015-11-14 DIAGNOSIS — Z7901 Long term (current) use of anticoagulants: Secondary | ICD-10-CM

## 2015-11-14 DIAGNOSIS — I81 Portal vein thrombosis: Secondary | ICD-10-CM

## 2015-11-14 DIAGNOSIS — M48061 Spinal stenosis, lumbar region without neurogenic claudication: Secondary | ICD-10-CM | POA: Diagnosis not present

## 2015-11-14 DIAGNOSIS — Z79891 Long term (current) use of opiate analgesic: Secondary | ICD-10-CM | POA: Diagnosis not present

## 2015-11-14 DIAGNOSIS — G894 Chronic pain syndrome: Secondary | ICD-10-CM | POA: Diagnosis not present

## 2015-11-14 LAB — COAGUCHEK XS/INR WAIVED
INR: 1.8 — ABNORMAL HIGH (ref 0.9–1.1)
PROTHROMBIN TIME: 21.9 s

## 2015-11-15 ENCOUNTER — Encounter (HOSPITAL_COMMUNITY): Payer: Self-pay | Admitting: Oncology

## 2015-11-15 ENCOUNTER — Encounter (HOSPITAL_COMMUNITY): Payer: Medicare HMO | Attending: Hematology & Oncology | Admitting: Oncology

## 2015-11-15 VITALS — BP 133/74 | HR 83 | Temp 98.3°F | Resp 18 | Ht 72.0 in | Wt 360.0 lb

## 2015-11-15 DIAGNOSIS — R161 Splenomegaly, not elsewhere classified: Secondary | ICD-10-CM | POA: Insufficient documentation

## 2015-11-15 DIAGNOSIS — D708 Other neutropenia: Secondary | ICD-10-CM

## 2015-11-15 DIAGNOSIS — D61818 Other pancytopenia: Secondary | ICD-10-CM | POA: Insufficient documentation

## 2015-11-15 DIAGNOSIS — D696 Thrombocytopenia, unspecified: Secondary | ICD-10-CM | POA: Insufficient documentation

## 2015-11-15 DIAGNOSIS — K7460 Unspecified cirrhosis of liver: Secondary | ICD-10-CM

## 2015-11-15 DIAGNOSIS — D72819 Decreased white blood cell count, unspecified: Secondary | ICD-10-CM | POA: Diagnosis not present

## 2015-11-15 DIAGNOSIS — L409 Psoriasis, unspecified: Secondary | ICD-10-CM | POA: Insufficient documentation

## 2015-11-15 DIAGNOSIS — R69 Illness, unspecified: Secondary | ICD-10-CM | POA: Diagnosis not present

## 2015-11-15 NOTE — Patient Instructions (Signed)
[place] at [facility] Discharge Instructions  RECOMMENDATIONS MADE BY THE CONSULTANT AND ANY TEST RESULTS WILL BE SENT TO YOUR REFERRING PHYSICIAN.  You were seen today by Kirby Crigler PA-C. Return in 4 months for labs and follow up.   Thank you for choosing [place] at [facility] to provide your oncology and hematology care.  To afford each patient quality time with our provider, please arrive at least 15 minutes before your scheduled appointment time.   Beginning January 23rd 2017 lab work for the [facility] will be done in the  Main lab at [facility] on 1st floor. If you have a lab appointment with the [facility] please come in thru the  Main Entrance and check in at the main information desk  You need to re-schedule your appointment should you arrive 10 or more minutes late.  We strive to give you quality time with our providers, and arriving late affects you and other patients whose appointments are after yours.  Also, if you no show three or more times for appointments you may be dismissed from the clinic at the providers discretion.     Again, thank you for choosing [facility].  Our hope is that these requests will decrease the amount of time that you wait before being seen by our physicians.       _____________________________________________________________  Should you have questions after your visit to [facility], please contact our office at [phone] between the hours of 8:30 a.m. and 4:30 p.m.  Voicemails left after 4:30 p.m. will not be returned until the following business day.  For prescription refill requests, have your pharmacy contact our office.         Resources For Cancer Patients and their Caregivers ? American Cancer Society: Can assist with transportation, wigs, general needs, runs Look Good Feel Better.        581-809-9883 ? Cancer Care: Provides financial assistance,  online support groups, medication/co-pay assistance.  1-800-813-HOPE 984 371 6292) ? [place] Assists [place] cancer patients and their families through emotional , educational and financial support.  8304929888 ? [place] DSS Where to apply for food stamps, Medicaid and utility assistance. (813)528-5420 ? [facility]: Transportation to medical appointments. 715-570-2041 ? Social Security Administration: May apply for disability if have a Stage IV cancer. 512-251-7730 267 469 7647 ? [employer], Disability and Transit Services: Assists with nutrition, care and transit needs. [place] Support Programs: @10RELATIVEDAYS @ > Cancer Support Group  2nd Tuesday of the month 1pm-2pm, Journey Room  > Creative Journey  3rd Tuesday of the month 1130am-1pm, Journey Room  > Look Good Feel Better  1st Wednesday of the month 10am-12 noon, Journey Room (Call [place] to register [phone])

## 2015-11-15 NOTE — Assessment & Plan Note (Addendum)
Long standing leukopenia/thrombocytopenia secondary to cirrhosis due to EtOH and possible NAFLD and splenomegaly.  S/P bone marrow aspiration and biopsy on 10/24/2015 that demonstrated normocellular bone marrow with trilineage hematopoiesis and normal cytogenetics.  I personally reviewed and went over pathology results with the patient.  Bone marrow aspiration and biopsy on 10/24/2015 that demonstrated normocellular bone marrow with trilineage hematopoiesis and normal cytogenetics.  Labs in 3-4 months: CBC diff, ferritin, renal function panel.  He reports that health care costs are financially burdensome for him and therefore, he is agreeable to return in 4 months.  He does not want to return sooner than that.  He is educated on signs and symptoms to report that may require him to return sooner than planned.  Return in 3-4 months for follow-up.

## 2015-11-15 NOTE — Progress Notes (Signed)
[name], MD [address] [place] [address]  Other neutropenia [facility]) - Plan: pantoprazole (PROTONIX) 40 MG tablet, morphine (MS CONTIN) 60 MG 12 hr tablet, LINZESS 145 MCG CAPS capsule, Hydrocodone-Ibuprofen 10-200 MG TABS, ULORIC 40 MG tablet, CBC with Differential, Ferritin  CURRENT THERAPY: Observation  INTERVAL HISTORY: [name] 50 y.o. male returns for followup of Long standing leukopenia/thrombocytopenia secondary to cirrhosis due to EtOH and possible NAFLD and splenomegaly.  S/P bone marrow aspiration and biopsy on 10/24/2015 that demonstrated normocellular bone marrow with trilineage hematopoiesis and normal cytogenetics.   He tolerated his bone arrow aspiration and biopsy well.  He notes that it wasn't as bad as he thought it would be.  His Vitamin K antagonist therapy continues to be manipulated by his primary care provider.  INR was checked yesterday and he was subtherapeutic.  As a result, his dose has changed.  He denies any B symptoms.  He denies any fevers, chills, or antibiotic needs.  He denies any hospitalizations.  Review of Systems  Constitutional: Positive for weight loss. Negative for chills and fever.  HENT: Negative.   Eyes: Negative.   Respiratory: Negative.  Negative for cough.   Cardiovascular: Negative.  Negative for chest pain.  Gastrointestinal: Negative.  Negative for constipation, diarrhea, nausea and vomiting.  Genitourinary: Negative.   Musculoskeletal: Negative.   Skin: Negative.  Negative for rash.  Neurological: Negative.  Negative for weakness.  Endo/Heme/Allergies: Negative.  Does not bruise/bleed easily.  Psychiatric/Behavioral: Negative.     Past Medical History:  Diagnosis Date  . Anxiety   . Atrial fibrillation ([place])   . CHF (congestive heart failure) ([place])   . Cirrhosis ([place])   . Cirrhosis ([place])    NASH  . Constipation   . COPD (chronic obstructive pulmonary disease) ([place])    with cpap  . Diabetes  mellitus    type 2  . Genu varus   . GERD (gastroesophageal reflux disease)   . Gout   . Heart murmur   . Leukopenia 07/08/2015  . Neuromuscular disorder (H[facility])    neuropathy in hands and feet  . Psoriasis   . RA (rheumatoid arthritis) ([place])   . Sleep apnea    cpap used- level 10 and greater  . Thrombocytopenia ([place])     Past Surgical History:  Procedure Laterality Date  . ANKLE SURGERY     right ankle talor repair  . CHOLECYSTECTOMY  sept 2016  . CHOLECYSTECTOMY    . COLONOSCOPY  05/08/2011   Procedure: COLONOSCOPY;  Surgeon: Rogene Houston, MD;  [facility];  Service: Endoscopy;  Laterality: N/A;  730  . ESOPHAGOGASTRODUODENOSCOPY  02/28/2011   Procedure: ESOPHAGOGASTRODUODENOSCOPY (EGD);  Surgeon: Rogene Houston, MD;  Location: [facility];  Service: Endoscopy;  Laterality: N/A;  1200  . ESOPHAGOGASTRODUODENOSCOPY N/A 06/11/2012   Procedure: ESOPHAGOGASTRODUODENOSCOPY (EGD);  Surgeon: Rogene Houston, MD;  Location: [facility];  Service: Endoscopy;  Laterality: N/A;  1200  FYI patient is 400 pounds  . ESOPHAGOGASTRODUODENOSCOPY (EGD) WITH PROPOFOL N/A 04/21/2014   Procedure: ESOPHAGOGASTRODUODENOSCOPY (EGD) WITH PROPOFOL;  Surgeon: Rogene Houston, MD;  Location: [facility];  Service: Endoscopy;  Laterality: N/A;  . HERNIA REPAIR Right    as child; inguinal  . HERNIA REPAIR  sept 2016  . LIVER BIOPSY  2012  . SCIATIC NERVE EXPLORATION    . TONSILLECTOMY      Family History  Problem Relation Age of  Onset  . Colon cancer Mother   . Cancer Mother     intestine  . Cancer Father     throat  . Cancer Brother     brain  . Cancer Sister   . Heart attack Neg Hx   . Stroke Neg Hx     Social History   Social History  . Marital status: Divorced    Spouse name: N/A  . Number of children: N/A  . Years of education: N/A   Social History Main Topics  . Smoking status: Never Smoker  . Smokeless tobacco: Current User    Types: Snuff, Chew     Comment: Pt  reports that he "dips"  . Alcohol use No     Comment: stopped drinking 2-3 yrs ago.   . Drug use: No     Comment: Use to smoke cocaine and marijuana. No IV drug use  . Sexual activity: Yes   Other Topics Concern  . None   Social History Narrative  . None     PHYSICAL EXAMINATION  ECOG PERFORMANCE STATUS: 1 - Symptomatic but completely ambulatory  Vitals:   11/15/15 0838  BP: 133/74  Pulse: 83  Resp: 18  Temp: 98.3 F (36.8 C)    GENERAL:alert, no distress, well nourished, well developed, comfortable, cooperative, obese, smiling and unaccompanied, malodorous body odor. SKIN: skin color, texture, turgor are normal, no rashes or significant lesions HEAD: Normocephalic, No masses, lesions, tenderness or abnormalities EYES: normal, EOMI, Conjunctiva are pink and non-injected EARS: External ears normal OROPHARYNX:lips, buccal mucosa, and tongue normal and mucous membranes are moist  NECK: supple, no adenopathy, trachea midline LYMPH:  no palpable lymphadenopathy BREAST:not examined LUNGS: clear to auscultation  HEART: regular rate & rhythm ABDOMEN:abdomen soft and obese BACK: Back symmetric, no curvature. EXTREMITIES:less then 2 second capillary refill, no joint deformities, effusion, or inflammation, no skin discoloration, no cyanosis  NEURO: alert & oriented x 3 with fluent speech, no focal motor/sensory deficits, gait normal   LABORATORY DATA: CBC    Component Value Date/Time   WBC 1.3 (LL) 10/24/2015 0928   RBC 4.17 (L) 10/24/2015 0928   HGB 11.7 (L) 10/24/2015 0928   HCT 33.9 (L) 10/24/2015 0928   HCT 40.3 07/11/2015 0916   PLT 46 (L) 10/24/2015 0928   PLT 50 (LL) 07/11/2015 0916   MCV 81.3 10/24/2015 0928   MCV 85 07/11/2015 0916   MCH 28.1 10/24/2015 0928   MCHC 34.5 10/24/2015 0928   RDW 14.7 10/24/2015 0928   RDW 15.6 (H) 07/11/2015 0916   LYMPHSABS 0.5 (L) 10/24/2015 0928   LYMPHSABS 0.8 07/11/2015 0916   MONOABS 0.1 10/24/2015 0928   EOSABS 0.1  10/24/2015 0928   EOSABS 0.2 07/11/2015 0916   BASOSABS 0.0 10/24/2015 0928   BASOSABS 0.1 07/11/2015 0916      Chemistry      Component Value Date/Time   NA 135 10/31/2015 0825   K 3.2 (L) 10/31/2015 0825   CL 89 (L) 10/31/2015 0825   CO2 27 10/31/2015 0825   BUN 13 10/31/2015 0825   CREATININE 0.88 10/31/2015 0825   CREATININE 0.67 (L) 09/06/2014 0820      Component Value Date/Time   CALCIUM 9.3 10/31/2015 0825   ALKPHOS 84 10/31/2015 0825   AST 60 (H) 10/31/2015 0825   ALT 35 10/31/2015 0825   BILITOT 0.9 10/31/2015 0825       PENDING LABS:   RADIOGRAPHIC STUDIES:  Ct Biopsy  Result Date: 10/24/2015 INDICATION: 50 year old  male with leukopenia and thrombocytopenia. EXAM: CT GUIDED BONE MARROW ASPIRATION AND CORE BIOPSY Interventional Radiologist:  Criselda Peaches, MD MEDICATIONS: None. ANESTHESIA/SEDATION: Moderate (conscious) sedation was employed during this procedure. A total of 8 milligrams versed and 200 micrograms fentanyl were administered intravenously. The patient's level of consciousness and vital signs were monitored continuously by radiology nursing throughout the procedure under my direct supervision. Total monitored sedation time: 19 minutes FLUOROSCOPY TIME:  Fluoroscopy Time: 0 minutes 0 seconds (0 mGy). COMPLICATIONS: None immediate. Estimated blood loss: <25 mL PROCEDURE: Informed written consent was obtained from the patient after a thorough discussion of the procedural risks, benefits and alternatives. All questions were addressed. Maximal Sterile Barrier Technique was utilized including caps, mask, sterile gowns, sterile gloves, sterile drape, hand hygiene and skin antiseptic. A timeout was performed prior to the initiation of the procedure. The patient was positioned prone and non-contrast localization CT was performed of the pelvis to demonstrate the iliac marrow spaces. Maximal barrier sterile technique utilized including caps, mask, sterile gowns,  sterile gloves, large sterile drape, hand hygiene, and betadine prep. Under sterile conditions and local anesthesia, an 11 gauge coaxial bone biopsy needle was advanced into the right iliac marrow space. Needle position was confirmed with CT imaging. Initially, bone marrow aspiration was performed. Next, the 11 gauge outer cannula was utilized to obtain a right iliac bone marrow core biopsy. Needle was removed. Hemostasis was obtained with compression. The patient tolerated the procedure well. Samples were prepared with the cytotechnologist. IMPRESSION: Technically successful CT-guided bone marrow biopsy. Electronically Signed   By: Jacqulynn Cadet M.D.   On: 10/24/2015 12:52     PATHOLOGY:    ASSESSMENT AND PLAN:  Leukopenia Long standing leukopenia/thrombocytopenia secondary to cirrhosis due to EtOH and possible NAFLD and splenomegaly.  S/P bone marrow aspiration and biopsy on 10/24/2015 that demonstrated normocellular bone marrow with trilineage hematopoiesis and normal cytogenetics.  I personally reviewed and went over pathology results with the patient.  Bone marrow aspiration and biopsy on 10/24/2015 that demonstrated normocellular bone marrow with trilineage hematopoiesis and normal cytogenetics.  Labs in 3-4 months: CBC diff, ferritin, renal function panel.  He reports that health care costs are financially burdensome for him and therefore, he is agreeable to return in 4 months.  He does not want to return sooner than that.  He is educated on signs and symptoms to report that may require him to return sooner than planned.  Return in 3-4 months for follow-up.    ORDERS PLACED FOR THIS ENCOUNTER: Orders Placed This Encounter  Procedures  . CBC with Differential  . Ferritin    MEDICATIONS PRESCRIBED THIS ENCOUNTER: Meds ordered this encounter  Medications  . pantoprazole (PROTONIX) 40 MG tablet  . morphine (MS CONTIN) 60 MG 12 hr tablet  . LINZESS 145 MCG CAPS capsule  .  Hydrocodone-Ibuprofen 10-200 MG TABS  . ULORIC 40 MG tablet    THERAPY PLAN:  Observation  All questions were answered. The patient knows to call the clinic with any problems, questions or concerns. We can certainly see the patient much sooner if necessary.  Patient and plan discussed with Dr. Ancil Linsey and she is in agreement with the aforementioned.   This note is electronically signed by: Doy Mince 11/15/2015 9:14 AM

## 2015-11-16 ENCOUNTER — Encounter (HOSPITAL_COMMUNITY): Payer: Self-pay

## 2015-11-16 ENCOUNTER — Encounter: Payer: Medicare HMO | Admitting: Pharmacist

## 2015-12-02 ENCOUNTER — Other Ambulatory Visit: Payer: Self-pay | Admitting: Pharmacist

## 2015-12-05 ENCOUNTER — Ambulatory Visit (INDEPENDENT_AMBULATORY_CARE_PROVIDER_SITE_OTHER): Payer: Medicare HMO | Admitting: Internal Medicine

## 2015-12-08 ENCOUNTER — Other Ambulatory Visit: Payer: Self-pay | Admitting: Family Medicine

## 2015-12-08 ENCOUNTER — Ambulatory Visit: Payer: Medicare HMO | Admitting: Family Medicine

## 2015-12-12 ENCOUNTER — Ambulatory Visit (INDEPENDENT_AMBULATORY_CARE_PROVIDER_SITE_OTHER): Payer: Medicare HMO

## 2015-12-12 ENCOUNTER — Telehealth: Payer: Self-pay

## 2015-12-12 ENCOUNTER — Other Ambulatory Visit: Payer: Self-pay

## 2015-12-12 ENCOUNTER — Ambulatory Visit (INDEPENDENT_AMBULATORY_CARE_PROVIDER_SITE_OTHER): Payer: Medicare HMO | Admitting: Family Medicine

## 2015-12-12 ENCOUNTER — Encounter: Payer: Self-pay | Admitting: Family Medicine

## 2015-12-12 VITALS — BP 106/71 | HR 97 | Temp 99.7°F | Ht 73.83 in | Wt 369.8 lb

## 2015-12-12 DIAGNOSIS — D72819 Decreased white blood cell count, unspecified: Secondary | ICD-10-CM

## 2015-12-12 DIAGNOSIS — M1A9XX0 Chronic gout, unspecified, without tophus (tophi): Secondary | ICD-10-CM | POA: Diagnosis not present

## 2015-12-12 DIAGNOSIS — R6883 Chills (without fever): Secondary | ICD-10-CM

## 2015-12-12 DIAGNOSIS — K429 Umbilical hernia without obstruction or gangrene: Secondary | ICD-10-CM

## 2015-12-12 DIAGNOSIS — D689 Coagulation defect, unspecified: Secondary | ICD-10-CM

## 2015-12-12 LAB — URINALYSIS, COMPLETE
Bilirubin, UA: NEGATIVE
GLUCOSE, UA: NEGATIVE
Ketones, UA: NEGATIVE
Leukocytes, UA: NEGATIVE
Nitrite, UA: NEGATIVE
PROTEIN UA: NEGATIVE
Specific Gravity, UA: 1.01 (ref 1.005–1.030)
Urobilinogen, Ur: 0.2 mg/dL (ref 0.2–1.0)
pH, UA: 7 (ref 5.0–7.5)

## 2015-12-12 LAB — MICROSCOPIC EXAMINATION: Epithelial Cells (non renal): >10 /hpf — AB (ref 0–10)

## 2015-12-12 MED ORDER — CEFDINIR 300 MG PO CAPS: 300.0000 mg | ORAL_CAPSULE | Freq: Two times a day (BID) | ORAL | 0 refills | Status: DC

## 2015-12-12 NOTE — Telephone Encounter (Signed)
Tried to contact patient to have him come back in to get INR that was not collected today but was supposed to. [email or web address]

## 2015-12-12 NOTE — Progress Notes (Signed)
Patient aware.

## 2015-12-12 NOTE — Patient Instructions (Addendum)
Great to see you!  You can take colchicine twice daily as needed for gout.   Please call GI for a follow up   We will call with labs as soon as they are available

## 2015-12-12 NOTE — Telephone Encounter (Signed)
INR order placed. Patient will not be able to come back in to get it done until Tuesday afternoon or Wednesday morning. Just a FYI.

## 2015-12-12 NOTE — Telephone Encounter (Signed)
Appreciate FYI.   [name], MD [facility] 12/12/2015, 5:25 PM

## 2015-12-12 NOTE — Progress Notes (Signed)
   HPI  Patient presents today for routine follow-up, also with acute illness.  Gout Patient was unable to tolerate starting allopurinol. He was taking twice daily culture seen with it, however he states that after 2 doses he had severe knee pain and had to stop.  Illness Patient reports chills, sweats, and subjective fever overnight. He denies any associated symptoms. Specifically he denies cough, chest pain, shortness of breath, abdominal pain except for his hernia, and dysuria.  Abdominal hernia Patient has a long-standing large soft abdominal hernia. He states that it's very irritated, however it's not tender or painful. It is stable for the last several weeks.    PMH: Smoking status noted ROS: Per HPI  Objective: BP 106/71   Pulse 97   Temp 99.7 F (37.6 C) (Oral)   Ht 6' 1.83" (1.875 m)   Wt (!) 369 lb 12.8 oz (167.7 kg)   BMI 47.70 kg/m  Gen: NAD, alert, cooperative with exam HEENT: NCAT CV: RRR, good S1/S2, no murmur Resp: CTABL, no wheezes, non-labored Abd: SNTND, BS present, no guarding or organomegaly, Large central soft umbilical hernia without tenderness to palpation, easily reducible with large defect in abd wall Ext: No edema, warm Neuro: Alert and oriented, No gross deficits  Assessment and plan:  # Chills, leukopenia. Patient with leukopenia on last blood draw and chills with elevated temperature 99.7 today. Unclear etiology, however given low white count started him on Omnicef and am looking broadly for infectious etiology. CBC, CMP Chest x-ray, urinalysis Low threshold for return or seeking further care if symptoms worsen  # Umbilical hernia Patient with soft large umbilical hernia, no signs of incarceration. He states that it's painful, however he easily pushes on it in the exam, I also can palpate and reduce the hernia without any complaints of pain or tenderness. There is no redness. I do not believe this is the source of his elevated temperature  and chills.  # Gout Patient unable to tolerate starting allopurinol, this is despite taking cold is seen twice daily during induction. Patient states he will try again. He could not afford Uloric Continue 1-2 times daily colchicine only as needed.    Orders Placed This Encounter  Procedures  . Microscopic Examination  . DG Chest 2 View    Standing Status:   Future    Number of Occurrences:   1    Standing Expiration Date:   02/11/2017    Order Specific Question:   Reason for Exam (SYMPTOM  OR DIAGNOSIS REQUIRED)    Answer:   chills, unexplained fever (subjective), lymphopenia, eval for CAP    Order Specific Question:   Preferred imaging location?    Answer:   External  . CBC with Differential/Platelet  . CMP14+EGFR  . Urinalysis, Complete    Meds ordered this encounter  Medications  . cefdinir (OMNICEF) 300 MG capsule    Sig: Take 1 capsule (300 mg total) by mouth 2 (two) times daily. 1 po BID    Dispense:  20 capsule    Refill:  0    [name], MD [facility] 12/12/2015, 11:51 AM

## 2015-12-13 ENCOUNTER — Ambulatory Visit (INDEPENDENT_AMBULATORY_CARE_PROVIDER_SITE_OTHER): Payer: Medicare HMO | Admitting: Internal Medicine

## 2015-12-13 ENCOUNTER — Other Ambulatory Visit: Payer: Medicare HMO

## 2015-12-13 ENCOUNTER — Ambulatory Visit: Payer: Self-pay | Admitting: Pharmacist

## 2015-12-13 DIAGNOSIS — D689 Coagulation defect, unspecified: Secondary | ICD-10-CM | POA: Diagnosis not present

## 2015-12-13 LAB — COAGUCHEK XS/INR WAIVED
INR: 2.9 — ABNORMAL HIGH (ref 0.9–1.1)
PROTHROMBIN TIME: 35.2 s

## 2015-12-13 NOTE — Telephone Encounter (Signed)
error 

## 2015-12-14 LAB — CBC WITH DIFFERENTIAL/PLATELET
BASOS ABS: 0 10*3/uL (ref 0.0–0.2)
BASOS: 1 %
EOS (ABSOLUTE): 0 10*3/uL (ref 0.0–0.4)
Eos: 1 %
HEMATOCRIT: 39.8 % (ref 37.5–51.0)
Hemoglobin: 13.4 g/dL (ref 13.0–17.7)
IMMATURE GRANS (ABS): 0 10*3/uL (ref 0.0–0.1)
Immature Granulocytes: 0 %
LYMPHS: 11 %
Lymphocytes Absolute: 0.3 10*3/uL — ABNORMAL LOW (ref 0.7–3.1)
MCH: 27.9 pg (ref 26.6–33.0)
MCHC: 33.7 g/dL (ref 31.5–35.7)
MCV: 83 fL (ref 79–97)
MONOCYTES: 9 %
Monocytes Absolute: 0.3 10*3/uL (ref 0.1–0.9)
NEUTROS ABS: 2.5 10*3/uL (ref 1.4–7.0)
Neutrophils: 78 %
PLATELETS: 54 10*3/uL — AB (ref 150–379)
RBC: 4.8 x10E6/uL (ref 4.14–5.80)
RDW: 16 % — ABNORMAL HIGH (ref 12.3–15.4)
WBC: 3.1 10*3/uL — ABNORMAL LOW (ref 3.4–10.8)

## 2015-12-14 LAB — CMP14+EGFR
A/G RATIO: 1.6 (ref 1.2–2.2)
ALBUMIN: 4.2 g/dL (ref 3.5–5.5)
ALT: 31 IU/L (ref 0–44)
AST: 52 IU/L — ABNORMAL HIGH (ref 0–40)
Alkaline Phosphatase: 83 IU/L (ref 39–117)
BILIRUBIN TOTAL: 0.7 mg/dL (ref 0.0–1.2)
BUN / CREAT RATIO: 14 (ref 9–20)
BUN: 15 mg/dL (ref 6–24)
CALCIUM: 8.9 mg/dL (ref 8.7–10.2)
CHLORIDE: 94 mmol/L — AB (ref 96–106)
CO2: 25 mmol/L (ref 18–29)
Creatinine, Ser: 1.07 mg/dL (ref 0.76–1.27)
GFR, EST AFRICAN AMERICAN: 92 mL/min/{1.73_m2} (ref >59)
GFR, EST NON AFRICAN AMERICAN: 79 mL/min/{1.73_m2} (ref >59)
GLOBULIN, TOTAL: 2.7 g/dL (ref 1.5–4.5)
Glucose: 98 mg/dL (ref 65–99)
POTASSIUM: 3.4 mmol/L — AB (ref 3.5–5.2)
SODIUM: 137 mmol/L (ref 134–144)
TOTAL PROTEIN: 6.9 g/dL (ref 6.0–8.5)

## 2015-12-15 ENCOUNTER — Telehealth: Payer: Self-pay | Admitting: Family Medicine

## 2015-12-15 NOTE — Telephone Encounter (Signed)
Pt advised to keep his appt 12/28.

## 2015-12-19 ENCOUNTER — Encounter: Payer: Self-pay | Admitting: Family Medicine

## 2015-12-19 ENCOUNTER — Ambulatory Visit (INDEPENDENT_AMBULATORY_CARE_PROVIDER_SITE_OTHER): Payer: Medicare HMO | Admitting: Internal Medicine

## 2015-12-19 ENCOUNTER — Telehealth: Payer: Self-pay | Admitting: Family Medicine

## 2015-12-19 NOTE — Telephone Encounter (Signed)
Pt is going to send a mychart message and attach a picture.

## 2015-12-19 NOTE — Telephone Encounter (Signed)
Awaiting on provider feedback and will call pt.

## 2015-12-20 ENCOUNTER — Other Ambulatory Visit: Payer: Self-pay | Admitting: Family Medicine

## 2015-12-22 ENCOUNTER — Ambulatory Visit (INDEPENDENT_AMBULATORY_CARE_PROVIDER_SITE_OTHER): Payer: Medicare HMO | Admitting: Internal Medicine

## 2015-12-23 ENCOUNTER — Other Ambulatory Visit (INDEPENDENT_AMBULATORY_CARE_PROVIDER_SITE_OTHER): Payer: Self-pay | Admitting: Internal Medicine

## 2015-12-29 ENCOUNTER — Other Ambulatory Visit (HOSPITAL_COMMUNITY): Payer: Medicare HMO

## 2015-12-29 ENCOUNTER — Ambulatory Visit (HOSPITAL_COMMUNITY): Payer: Medicare HMO | Admitting: Oncology

## 2016-01-05 ENCOUNTER — Ambulatory Visit: Payer: Self-pay | Admitting: Pharmacist

## 2016-01-08 DIAGNOSIS — D62 Acute posthemorrhagic anemia: Secondary | ICD-10-CM | POA: Diagnosis not present

## 2016-01-08 DIAGNOSIS — D61818 Other pancytopenia: Secondary | ICD-10-CM | POA: Diagnosis not present

## 2016-01-08 DIAGNOSIS — T45515A Adverse effect of anticoagulants, initial encounter: Secondary | ICD-10-CM | POA: Diagnosis not present

## 2016-01-08 DIAGNOSIS — R600 Localized edema: Secondary | ICD-10-CM | POA: Diagnosis not present

## 2016-01-08 DIAGNOSIS — R791 Abnormal coagulation profile: Secondary | ICD-10-CM | POA: Diagnosis not present

## 2016-01-08 DIAGNOSIS — K766 Portal hypertension: Secondary | ICD-10-CM | POA: Diagnosis not present

## 2016-01-08 DIAGNOSIS — K922 Gastrointestinal hemorrhage, unspecified: Secondary | ICD-10-CM | POA: Diagnosis not present

## 2016-01-08 DIAGNOSIS — D696 Thrombocytopenia, unspecified: Secondary | ICD-10-CM | POA: Diagnosis not present

## 2016-01-08 DIAGNOSIS — R040 Epistaxis: Secondary | ICD-10-CM | POA: Diagnosis not present

## 2016-01-08 DIAGNOSIS — R58 Hemorrhage, not elsewhere classified: Secondary | ICD-10-CM | POA: Diagnosis not present

## 2016-01-08 DIAGNOSIS — K2971 Gastritis, unspecified, with bleeding: Secondary | ICD-10-CM | POA: Diagnosis not present

## 2016-01-08 DIAGNOSIS — E119 Type 2 diabetes mellitus without complications: Secondary | ICD-10-CM | POA: Diagnosis not present

## 2016-01-08 DIAGNOSIS — I5030 Unspecified diastolic (congestive) heart failure: Secondary | ICD-10-CM | POA: Diagnosis not present

## 2016-01-08 DIAGNOSIS — J449 Chronic obstructive pulmonary disease, unspecified: Secondary | ICD-10-CM | POA: Diagnosis not present

## 2016-01-08 DIAGNOSIS — D6832 Hemorrhagic disorder due to extrinsic circulating anticoagulants: Secondary | ICD-10-CM | POA: Diagnosis not present

## 2016-01-08 DIAGNOSIS — M109 Gout, unspecified: Secondary | ICD-10-CM | POA: Diagnosis not present

## 2016-01-08 DIAGNOSIS — Z6841 Body Mass Index (BMI) 40.0 and over, adult: Secondary | ICD-10-CM | POA: Diagnosis not present

## 2016-01-08 DIAGNOSIS — I5032 Chronic diastolic (congestive) heart failure: Secondary | ICD-10-CM | POA: Diagnosis not present

## 2016-01-08 DIAGNOSIS — M1990 Unspecified osteoarthritis, unspecified site: Secondary | ICD-10-CM | POA: Diagnosis not present

## 2016-01-08 DIAGNOSIS — E1142 Type 2 diabetes mellitus with diabetic polyneuropathy: Secondary | ICD-10-CM | POA: Diagnosis not present

## 2016-01-08 DIAGNOSIS — K319 Disease of stomach and duodenum, unspecified: Secondary | ICD-10-CM | POA: Diagnosis not present

## 2016-01-08 DIAGNOSIS — R69 Illness, unspecified: Secondary | ICD-10-CM | POA: Diagnosis not present

## 2016-01-08 DIAGNOSIS — Z7901 Long term (current) use of anticoagulants: Secondary | ICD-10-CM | POA: Diagnosis not present

## 2016-01-08 DIAGNOSIS — T45511A Poisoning by anticoagulants, accidental (unintentional), initial encounter: Secondary | ICD-10-CM | POA: Diagnosis not present

## 2016-01-08 DIAGNOSIS — K7460 Unspecified cirrhosis of liver: Secondary | ICD-10-CM | POA: Diagnosis not present

## 2016-01-08 DIAGNOSIS — I81 Portal vein thrombosis: Secondary | ICD-10-CM | POA: Diagnosis not present

## 2016-01-08 DIAGNOSIS — K7581 Nonalcoholic steatohepatitis (NASH): Secondary | ICD-10-CM | POA: Diagnosis not present

## 2016-01-08 DIAGNOSIS — D72819 Decreased white blood cell count, unspecified: Secondary | ICD-10-CM | POA: Diagnosis not present

## 2016-01-08 DIAGNOSIS — K2931 Chronic superficial gastritis with bleeding: Secondary | ICD-10-CM | POA: Diagnosis not present

## 2016-01-08 DIAGNOSIS — K921 Melena: Secondary | ICD-10-CM | POA: Diagnosis not present

## 2016-01-08 DIAGNOSIS — R0602 Shortness of breath: Secondary | ICD-10-CM | POA: Diagnosis not present

## 2016-01-08 DIAGNOSIS — K3189 Other diseases of stomach and duodenum: Secondary | ICD-10-CM | POA: Diagnosis not present

## 2016-01-08 DIAGNOSIS — R030 Elevated blood-pressure reading, without diagnosis of hypertension: Secondary | ICD-10-CM | POA: Diagnosis not present

## 2016-01-08 DIAGNOSIS — M1A9XX0 Chronic gout, unspecified, without tophus (tophi): Secondary | ICD-10-CM | POA: Diagnosis not present

## 2016-01-08 DIAGNOSIS — D689 Coagulation defect, unspecified: Secondary | ICD-10-CM | POA: Diagnosis not present

## 2016-01-08 DIAGNOSIS — I8510 Secondary esophageal varices without bleeding: Secondary | ICD-10-CM | POA: Diagnosis not present

## 2016-01-08 DIAGNOSIS — I4891 Unspecified atrial fibrillation: Secondary | ICD-10-CM | POA: Diagnosis not present

## 2016-01-08 DIAGNOSIS — Z791 Long term (current) use of non-steroidal anti-inflammatories (NSAID): Secondary | ICD-10-CM | POA: Diagnosis not present

## 2016-01-09 DIAGNOSIS — K319 Disease of stomach and duodenum, unspecified: Secondary | ICD-10-CM | POA: Diagnosis not present

## 2016-01-09 DIAGNOSIS — T45511A Poisoning by anticoagulants, accidental (unintentional), initial encounter: Secondary | ICD-10-CM | POA: Diagnosis not present

## 2016-01-09 DIAGNOSIS — R791 Abnormal coagulation profile: Secondary | ICD-10-CM | POA: Diagnosis not present

## 2016-01-09 DIAGNOSIS — D62 Acute posthemorrhagic anemia: Secondary | ICD-10-CM | POA: Diagnosis not present

## 2016-01-09 DIAGNOSIS — K7460 Unspecified cirrhosis of liver: Secondary | ICD-10-CM | POA: Diagnosis not present

## 2016-01-09 DIAGNOSIS — K7581 Nonalcoholic steatohepatitis (NASH): Secondary | ICD-10-CM | POA: Diagnosis not present

## 2016-01-09 DIAGNOSIS — D6832 Hemorrhagic disorder due to extrinsic circulating anticoagulants: Secondary | ICD-10-CM | POA: Diagnosis not present

## 2016-01-09 DIAGNOSIS — R69 Illness, unspecified: Secondary | ICD-10-CM | POA: Diagnosis not present

## 2016-01-09 DIAGNOSIS — Z791 Long term (current) use of non-steroidal anti-inflammatories (NSAID): Secondary | ICD-10-CM | POA: Diagnosis not present

## 2016-01-09 DIAGNOSIS — I81 Portal vein thrombosis: Secondary | ICD-10-CM | POA: Diagnosis not present

## 2016-01-09 DIAGNOSIS — R600 Localized edema: Secondary | ICD-10-CM | POA: Diagnosis not present

## 2016-01-09 DIAGNOSIS — I8510 Secondary esophageal varices without bleeding: Secondary | ICD-10-CM | POA: Diagnosis not present

## 2016-01-09 DIAGNOSIS — R0602 Shortness of breath: Secondary | ICD-10-CM | POA: Diagnosis not present

## 2016-01-09 DIAGNOSIS — R040 Epistaxis: Secondary | ICD-10-CM | POA: Diagnosis not present

## 2016-01-09 DIAGNOSIS — Z7901 Long term (current) use of anticoagulants: Secondary | ICD-10-CM | POA: Diagnosis not present

## 2016-01-09 DIAGNOSIS — K921 Melena: Secondary | ICD-10-CM | POA: Diagnosis not present

## 2016-01-10 ENCOUNTER — Telehealth: Payer: Self-pay | Admitting: Family Medicine

## 2016-01-10 DIAGNOSIS — K2931 Chronic superficial gastritis with bleeding: Secondary | ICD-10-CM | POA: Diagnosis not present

## 2016-01-10 DIAGNOSIS — K3189 Other diseases of stomach and duodenum: Secondary | ICD-10-CM | POA: Diagnosis not present

## 2016-01-10 DIAGNOSIS — K922 Gastrointestinal hemorrhage, unspecified: Secondary | ICD-10-CM | POA: Diagnosis not present

## 2016-01-10 DIAGNOSIS — K7581 Nonalcoholic steatohepatitis (NASH): Secondary | ICD-10-CM | POA: Diagnosis not present

## 2016-01-10 DIAGNOSIS — T45511A Poisoning by anticoagulants, accidental (unintentional), initial encounter: Secondary | ICD-10-CM | POA: Diagnosis not present

## 2016-01-10 DIAGNOSIS — K2971 Gastritis, unspecified, with bleeding: Secondary | ICD-10-CM | POA: Diagnosis not present

## 2016-01-10 DIAGNOSIS — D62 Acute posthemorrhagic anemia: Secondary | ICD-10-CM | POA: Diagnosis not present

## 2016-01-10 DIAGNOSIS — K766 Portal hypertension: Secondary | ICD-10-CM | POA: Diagnosis not present

## 2016-01-10 DIAGNOSIS — I81 Portal vein thrombosis: Secondary | ICD-10-CM | POA: Diagnosis not present

## 2016-01-10 DIAGNOSIS — D72819 Decreased white blood cell count, unspecified: Secondary | ICD-10-CM | POA: Diagnosis not present

## 2016-01-10 DIAGNOSIS — D6832 Hemorrhagic disorder due to extrinsic circulating anticoagulants: Secondary | ICD-10-CM | POA: Diagnosis not present

## 2016-01-10 DIAGNOSIS — R69 Illness, unspecified: Secondary | ICD-10-CM | POA: Diagnosis not present

## 2016-01-10 NOTE — Telephone Encounter (Signed)
Appreciate [place], MD [facility] 01/10/2016, 1:46 PM

## 2016-01-11 DIAGNOSIS — K7581 Nonalcoholic steatohepatitis (NASH): Secondary | ICD-10-CM | POA: Diagnosis not present

## 2016-01-11 DIAGNOSIS — D62 Acute posthemorrhagic anemia: Secondary | ICD-10-CM | POA: Diagnosis not present

## 2016-01-11 DIAGNOSIS — K922 Gastrointestinal hemorrhage, unspecified: Secondary | ICD-10-CM | POA: Diagnosis not present

## 2016-01-11 DIAGNOSIS — D72819 Decreased white blood cell count, unspecified: Secondary | ICD-10-CM | POA: Diagnosis not present

## 2016-01-11 DIAGNOSIS — K3189 Other diseases of stomach and duodenum: Secondary | ICD-10-CM | POA: Diagnosis not present

## 2016-01-11 DIAGNOSIS — D6832 Hemorrhagic disorder due to extrinsic circulating anticoagulants: Secondary | ICD-10-CM | POA: Diagnosis not present

## 2016-01-11 DIAGNOSIS — T45511A Poisoning by anticoagulants, accidental (unintentional), initial encounter: Secondary | ICD-10-CM | POA: Diagnosis not present

## 2016-01-11 DIAGNOSIS — K2931 Chronic superficial gastritis with bleeding: Secondary | ICD-10-CM | POA: Diagnosis not present

## 2016-01-11 DIAGNOSIS — I81 Portal vein thrombosis: Secondary | ICD-10-CM | POA: Diagnosis not present

## 2016-01-11 DIAGNOSIS — R69 Illness, unspecified: Secondary | ICD-10-CM | POA: Diagnosis not present

## 2016-01-11 DIAGNOSIS — K766 Portal hypertension: Secondary | ICD-10-CM | POA: Diagnosis not present

## 2016-01-12 DIAGNOSIS — K922 Gastrointestinal hemorrhage, unspecified: Secondary | ICD-10-CM | POA: Diagnosis not present

## 2016-01-12 DIAGNOSIS — R69 Illness, unspecified: Secondary | ICD-10-CM | POA: Diagnosis not present

## 2016-01-12 DIAGNOSIS — M1A9XX0 Chronic gout, unspecified, without tophus (tophi): Secondary | ICD-10-CM | POA: Diagnosis not present

## 2016-01-12 DIAGNOSIS — M109 Gout, unspecified: Secondary | ICD-10-CM | POA: Diagnosis not present

## 2016-01-12 DIAGNOSIS — D6832 Hemorrhagic disorder due to extrinsic circulating anticoagulants: Secondary | ICD-10-CM | POA: Diagnosis not present

## 2016-01-12 DIAGNOSIS — T45511A Poisoning by anticoagulants, accidental (unintentional), initial encounter: Secondary | ICD-10-CM | POA: Diagnosis not present

## 2016-01-12 DIAGNOSIS — K3189 Other diseases of stomach and duodenum: Secondary | ICD-10-CM | POA: Diagnosis not present

## 2016-01-12 DIAGNOSIS — K766 Portal hypertension: Secondary | ICD-10-CM | POA: Diagnosis not present

## 2016-01-12 DIAGNOSIS — I81 Portal vein thrombosis: Secondary | ICD-10-CM | POA: Diagnosis not present

## 2016-01-12 DIAGNOSIS — D62 Acute posthemorrhagic anemia: Secondary | ICD-10-CM | POA: Diagnosis not present

## 2016-01-13 ENCOUNTER — Ambulatory Visit: Payer: Medicare HMO | Admitting: Urology

## 2016-01-13 DIAGNOSIS — D6832 Hemorrhagic disorder due to extrinsic circulating anticoagulants: Secondary | ICD-10-CM | POA: Diagnosis not present

## 2016-01-13 DIAGNOSIS — K3189 Other diseases of stomach and duodenum: Secondary | ICD-10-CM | POA: Diagnosis not present

## 2016-01-13 DIAGNOSIS — I81 Portal vein thrombosis: Secondary | ICD-10-CM | POA: Diagnosis not present

## 2016-01-13 DIAGNOSIS — K7581 Nonalcoholic steatohepatitis (NASH): Secondary | ICD-10-CM | POA: Diagnosis not present

## 2016-01-13 DIAGNOSIS — K922 Gastrointestinal hemorrhage, unspecified: Secondary | ICD-10-CM | POA: Diagnosis not present

## 2016-01-13 DIAGNOSIS — K766 Portal hypertension: Secondary | ICD-10-CM | POA: Diagnosis not present

## 2016-01-13 DIAGNOSIS — T45511A Poisoning by anticoagulants, accidental (unintentional), initial encounter: Secondary | ICD-10-CM | POA: Diagnosis not present

## 2016-01-13 DIAGNOSIS — R040 Epistaxis: Secondary | ICD-10-CM | POA: Diagnosis not present

## 2016-01-13 DIAGNOSIS — R69 Illness, unspecified: Secondary | ICD-10-CM | POA: Diagnosis not present

## 2016-01-13 DIAGNOSIS — D62 Acute posthemorrhagic anemia: Secondary | ICD-10-CM | POA: Diagnosis not present

## 2016-01-14 DIAGNOSIS — M109 Gout, unspecified: Secondary | ICD-10-CM | POA: Diagnosis not present

## 2016-01-19 ENCOUNTER — Ambulatory Visit (INDEPENDENT_AMBULATORY_CARE_PROVIDER_SITE_OTHER): Payer: Medicare HMO | Admitting: Internal Medicine

## 2016-01-19 DIAGNOSIS — G894 Chronic pain syndrome: Secondary | ICD-10-CM | POA: Diagnosis not present

## 2016-01-19 DIAGNOSIS — Z79891 Long term (current) use of opiate analgesic: Secondary | ICD-10-CM | POA: Diagnosis not present

## 2016-01-19 DIAGNOSIS — M48061 Spinal stenosis, lumbar region without neurogenic claudication: Secondary | ICD-10-CM | POA: Diagnosis not present

## 2016-01-19 DIAGNOSIS — M179 Osteoarthritis of knee, unspecified: Secondary | ICD-10-CM | POA: Diagnosis not present

## 2016-01-23 ENCOUNTER — Ambulatory Visit: Payer: Medicare HMO | Admitting: Pharmacist

## 2016-01-23 ENCOUNTER — Telehealth: Payer: Self-pay | Admitting: Family Medicine

## 2016-01-23 NOTE — Telephone Encounter (Signed)
FYI Pt has appt on Friday for hospital follow-up Recently admitted to [facility] for nose bleed due to Warfarin toxicity Hospitalized x 3 days  Received platelets Pt was started on Propranolol Want you to review records before appt

## 2016-01-23 NOTE — Telephone Encounter (Signed)
Appreciate FYI.   Records reviewed.  [name], MD [facility] 01/23/2016, 12:05 PM

## 2016-01-23 NOTE — Telephone Encounter (Signed)
Explained to pt that Dr Wendi Snipes has reviewed records Pt wanted to be sure he had extended appt scheduled appt was given for 30 minute hospital follow up

## 2016-01-27 ENCOUNTER — Ambulatory Visit: Payer: Medicare HMO | Admitting: Family Medicine

## 2016-02-01 ENCOUNTER — Ambulatory Visit: Payer: Medicare HMO | Admitting: Pharmacist

## 2016-02-03 ENCOUNTER — Ambulatory Visit (INDEPENDENT_AMBULATORY_CARE_PROVIDER_SITE_OTHER): Payer: Medicare HMO | Admitting: Family Medicine

## 2016-02-03 ENCOUNTER — Encounter: Payer: Self-pay | Admitting: Family Medicine

## 2016-02-03 VITALS — BP 105/65 | HR 63 | Temp 97.0°F | Ht 73.83 in | Wt 352.4 lb

## 2016-02-03 DIAGNOSIS — Z86718 Personal history of other venous thrombosis and embolism: Secondary | ICD-10-CM

## 2016-02-03 DIAGNOSIS — K7460 Unspecified cirrhosis of liver: Secondary | ICD-10-CM | POA: Diagnosis not present

## 2016-02-03 DIAGNOSIS — I81 Portal vein thrombosis: Secondary | ICD-10-CM | POA: Diagnosis not present

## 2016-02-03 DIAGNOSIS — G894 Chronic pain syndrome: Secondary | ICD-10-CM

## 2016-02-03 DIAGNOSIS — Z7901 Long term (current) use of anticoagulants: Secondary | ICD-10-CM | POA: Diagnosis not present

## 2016-02-03 DIAGNOSIS — I480 Paroxysmal atrial fibrillation: Secondary | ICD-10-CM

## 2016-02-03 LAB — COAGUCHEK XS/INR WAIVED
INR: 1.1 (ref 0.9–1.1)
Prothrombin Time: 13.1 s

## 2016-02-03 MED ORDER — GABAPENTIN 600 MG PO TABS: 600.0000 mg | ORAL_TABLET | Freq: Three times a day (TID) | ORAL | 3 refills | Status: DC

## 2016-02-03 NOTE — Progress Notes (Signed)
   HPI  Patient presents today for hospital follow-up.  Patient was admitted to the hospital on 01/08/2016, he had a six-day hospital stay for acute blood loss after supratherapeutic INR from Coumadin. He has severe blood loss going to his low hemoglobin as just over 7. He did not require transfusion. 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 He has a chronic portal vein thrombosis which she was using Coumadin for ( as well as paroxysmal A fib), type 2 diabetes, obstructive sleep apnea.  Patient was evaluated with EGD while in the hospital and found to have portal hypertensive gastropathy, hemostatic clip was placed in the gastric antrum for bleeding. He was started on propranolol for portal hypertension. At first he did not tolerate this well, now he is doing better with only mild dizziness after taking the pill.  Patient complains of pain and requests and gabapentin today. He is taking his morphine sulfate prescribed by pain management. Patient has stopped NSAIDs as recommended by hospitalist.  PMH: Smoking status noted ROS: Per HPI  Objective: BP 105/65   Pulse 63   Temp 97 F (36.1 C) (Oral)   Ht 6' 1.83" (1.875 m)   Wt (!) 352 lb 6.4 oz (159.8 kg)   BMI 45.45 kg/m  Gen: NAD, alert, cooperative with exam HEENT: NCAT CV: RRR, good S1/S2, no murmur Resp: CTABL, no wheezes, non-labored Abd: SNTND, BS present, no guarding or organomegaly Ext: No edema, warm Neuro: Alert and oriented  Assessment and plan:  # On anticoagulation, portal vein thrombosis, paroxysmal atrial fibrillation Heart sounds are regular in rhythm today, patient with recent severe GI bleed and epistaxis causing 5+ gram of hemoglobin loss. No anticoagulation for now, INR now back to normal at 1.1. I would defer the decision to anticoagulate for the chronic portal vein thrombosis to GI Also consider follow-up with cardiology to discussed anticoagulation  with paroxysmal A. fib and severe bleed.   Inconsistent follow-up is a big issue for him with Coumadin dosing.  # Cirrhosis of the liver, portal hypertension Repeating labs today, alcoholic and NASH F/u with GI Continue propranolol BID  # Chronic pain syndrome Gabapentin prescribed per patient's request, previously 400 mg times daily, previously this was helpful. Increase to 600 mg 3 times daily Continue with Pain management    Orders Placed This Encounter  Procedures  . CBC with Differential/Platelet  . CMP14+EGFR    Meds ordered this encounter  Medications  . propranolol (INDERAL) 20 MG tablet    Sig: Take 20 mg by mouth.  . gabapentin (NEURONTIN) 600 MG tablet    Sig: Take 1 tablet (600 mg total) by mouth 3 (three) times daily.    Dispense:  90 tablet    Refill:  [place], MD [facility] 02/03/2016, 12:22 PM

## 2016-02-03 NOTE — Patient Instructions (Signed)
Great to see you!  Continue propranolol as discussed.   Please be sure to keep your GI appointment.

## 2016-02-06 ENCOUNTER — Other Ambulatory Visit: Payer: Self-pay | Admitting: Family

## 2016-02-06 LAB — CBC WITH DIFFERENTIAL/PLATELET
BASOS: 1 %
Basophils Absolute: 0 10*3/uL (ref 0.0–0.2)
EOS (ABSOLUTE): 0.1 10*3/uL (ref 0.0–0.4)
EOS: 5 %
HEMATOCRIT: 26.9 % — AB (ref 37.5–51.0)
HEMOGLOBIN: 8.6 g/dL — AB (ref 13.0–17.7)
IMMATURE GRANS (ABS): 0 10*3/uL (ref 0.0–0.1)
Immature Granulocytes: 0 %
Lymphocytes Absolute: 0.6 10*3/uL — ABNORMAL LOW (ref 0.7–3.1)
Lymphs: 43 %
MCH: 25.5 pg — ABNORMAL LOW (ref 26.6–33.0)
MCHC: 32 g/dL (ref 31.5–35.7)
MCV: 80 fL (ref 79–97)
MONOS ABS: 0.1 10*3/uL (ref 0.1–0.9)
Monocytes: 8 %
NEUTROS ABS: 0.6 10*3/uL — AB (ref 1.4–7.0)
Neutrophils: 43 %
Platelets: 59 10*3/uL — CL (ref 150–379)
RBC: 3.37 x10E6/uL — ABNORMAL LOW (ref 4.14–5.80)
RDW: 16.6 % — ABNORMAL HIGH (ref 12.3–15.4)
WBC: 1.3 10*3/uL — CL (ref 3.4–10.8)

## 2016-02-06 LAB — CMP14+EGFR
A/G RATIO: 1.4 (ref 1.2–2.2)
ALBUMIN: 3.6 g/dL (ref 3.5–5.5)
ALT: 20 IU/L (ref 0–44)
AST: 40 IU/L (ref 0–40)
Alkaline Phosphatase: 82 IU/L (ref 39–117)
BUN / CREAT RATIO: 13 (ref 9–20)
BUN: 11 mg/dL (ref 6–24)
Bilirubin Total: 0.8 mg/dL (ref 0.0–1.2)
CALCIUM: 8.7 mg/dL (ref 8.7–10.2)
CO2: 24 mmol/L (ref 18–29)
CREATININE: 0.88 mg/dL (ref 0.76–1.27)
Chloride: 98 mmol/L (ref 96–106)
GFR, EST AFRICAN AMERICAN: 114 mL/min/{1.73_m2} (ref >59)
GFR, EST NON AFRICAN AMERICAN: 99 mL/min/{1.73_m2} (ref >59)
GLOBULIN, TOTAL: 2.6 g/dL (ref 1.5–4.5)
Glucose: 147 mg/dL — ABNORMAL HIGH (ref 65–99)
POTASSIUM: 3.3 mmol/L — AB (ref 3.5–5.2)
SODIUM: 139 mmol/L (ref 134–144)
Total Protein: 6.2 g/dL (ref 6.0–8.5)

## 2016-02-10 ENCOUNTER — Telehealth (INDEPENDENT_AMBULATORY_CARE_PROVIDER_SITE_OTHER): Payer: Self-pay | Admitting: Internal Medicine

## 2016-02-10 NOTE — Telephone Encounter (Signed)
I called the patient to remind him his appointment for 02/15/16.  He stated that he just got out of [facility] at the beginning of the year.  He'd like you to review those notes before he comes in.  I told him we do not have access to that information and he said yes we do.  Discussed with Lelon Frohlich, she stated that the provider may be able to see that.  If you can see that, he'd like you to review prior to his appointment.  [phone]

## 2016-02-13 NOTE — Telephone Encounter (Signed)
Noted. Patient has cancelled x 5

## 2016-02-15 ENCOUNTER — Ambulatory Visit (INDEPENDENT_AMBULATORY_CARE_PROVIDER_SITE_OTHER): Payer: Medicare HMO | Admitting: Internal Medicine

## 2016-02-15 ENCOUNTER — Encounter (INDEPENDENT_AMBULATORY_CARE_PROVIDER_SITE_OTHER): Payer: Self-pay | Admitting: *Deleted

## 2016-02-20 ENCOUNTER — Telehealth: Payer: Self-pay | Admitting: Family Medicine

## 2016-02-20 MED ORDER — VOLTAREN 1 % TD GEL: 4.0000 g | Freq: Four times a day (QID) | TRANSDERMAL | 4 refills | Status: DC

## 2016-02-20 NOTE — Telephone Encounter (Signed)
Rx Sent  [name], MD [facility] 02/20/2016, 12:52 PM

## 2016-02-23 ENCOUNTER — Encounter (INDEPENDENT_AMBULATORY_CARE_PROVIDER_SITE_OTHER): Payer: Self-pay | Admitting: *Deleted

## 2016-02-23 ENCOUNTER — Ambulatory Visit (INDEPENDENT_AMBULATORY_CARE_PROVIDER_SITE_OTHER): Payer: Medicare HMO | Admitting: Internal Medicine

## 2016-02-23 ENCOUNTER — Encounter (INDEPENDENT_AMBULATORY_CARE_PROVIDER_SITE_OTHER): Payer: Self-pay | Admitting: Internal Medicine

## 2016-02-23 VITALS — BP 130/70 | HR 73 | Temp 98.0°F | Resp 18 | Ht >= 80 in | Wt 336.6 lb

## 2016-02-23 DIAGNOSIS — D500 Iron deficiency anemia secondary to blood loss (chronic): Secondary | ICD-10-CM | POA: Diagnosis not present

## 2016-02-23 DIAGNOSIS — I8290 Acute embolism and thrombosis of unspecified vein: Secondary | ICD-10-CM

## 2016-02-23 DIAGNOSIS — K7460 Unspecified cirrhosis of liver: Secondary | ICD-10-CM | POA: Diagnosis not present

## 2016-02-23 DIAGNOSIS — I8291 Chronic embolism and thrombosis of unspecified vein: Secondary | ICD-10-CM | POA: Diagnosis not present

## 2016-02-23 LAB — CBC WITH DIFFERENTIAL/PLATELET
Basophils Absolute: 23 cells/uL (ref 0–200)
Basophils Relative: 1 %
EOS PCT: 5 %
Eosinophils Absolute: 115 cells/uL (ref 15–500)
HEMATOCRIT: 31.1 % — AB (ref 38.5–50.0)
HEMOGLOBIN: 10.2 g/dL — AB (ref 13.2–17.1)
LYMPHS ABS: 690 {cells}/uL — AB (ref 850–3900)
Lymphocytes Relative: 30 %
MCH: 24.1 pg — ABNORMAL LOW (ref 27.0–33.0)
MCHC: 32.8 g/dL (ref 32.0–36.0)
MCV: 73.5 fL — ABNORMAL LOW (ref 80.0–100.0)
MONO ABS: 253 {cells}/uL (ref 200–950)
Monocytes Relative: 11 %
NEUTROS ABS: 1219 {cells}/uL — AB (ref 1500–7800)
Neutrophils Relative %: 53 %
Platelets: 65 10*3/uL — ABNORMAL LOW (ref 140–400)
RBC: 4.23 MIL/uL (ref 4.20–5.80)
RDW: 17.2 % — ABNORMAL HIGH (ref 11.0–15.0)
WBC: 2.3 10*3/uL — AB (ref 3.8–10.8)

## 2016-02-23 NOTE — Progress Notes (Addendum)
Subjective:    Patient ID: BENIGNO CHECK, male    DOB: 03/25/1965, 51 y.o.   MRN: [id number]  HPI Here today for f/u. Hx of cirrhosis secondary to alcohol and possible NAFLD. Last seen in  May of 2017.  Recently started on Warfarin for near occlusive portal vein thrombosis with signs of portal hypertension and developing shunts [facility]) on CT scan at [facility]. Repeat US of the abdomen in May of this 2017  revealed: significant amount of nonocclusive thrombus in the main portal vein near the liver. Some flow is detected in this segment of the main portal vein. Intrahepatic portal vein branches appear to be normally patent.  He is not on Coumadin at this time. Has been off Coumadin December 2017 He is not drinking.  In May his weight was 380. Today his weight is 336.6. He lost weight intentional.  He is exercises by working in the yard. Does not work.  Has a BM  Had EGD January 2018 at [place] [facility] were black.     Presented with marked epistaxix and INR of 9.49. Hemoglobin wa 8.4. ENT was consulted and Rhino rocket was placed. EGD showed moderate portal hypertensive gastropathy and hemostatic clip was placed in gastric antrum.   02/03/2016 H and H 8.6 and 26.9 CBC    Component Value Date/Time   WBC 1.3 (LL) 02/03/2016 0856   WBC 1.3 (LL) 10/24/2015 0928   RBC 3.37 (L) 02/03/2016 0856   RBC 4.17 (L) 10/24/2015 0928   HGB 11.7 (L) 10/24/2015 0928   HCT 26.9 (L) 02/03/2016 0856   PLT 59 (LL) 02/03/2016 0856   MCV 80 02/03/2016 0856   MCH 25.5 (L) 02/03/2016 0856   MCH 28.1 10/24/2015 0928   MCHC 32.0 02/03/2016 0856   MCHC 34.5 10/24/2015 0928   RDW 16.6 (H) 02/03/2016 0856   LYMPHSABS 0.6 (L) 02/03/2016 0856   MONOABS 0.1 10/24/2015 0928   EOSABS 0.1 02/03/2016 0856   BASOSABS 0.0 02/03/2016 0856  ] CMP     Component Value Date/Time   NA 139 02/03/2016 0856   K 3.3 (L) 02/03/2016 0856   CL 98 02/03/2016 0856   CO2 24 02/03/2016 0856   GLUCOSE 147 (H) 02/03/2016 0856   GLUCOSE 129 (H) 09/29/2015 0912   BUN 11 02/03/2016 0856   CREATININE 0.88 02/03/2016 0856   CREATININE 0.67 (L) 09/06/2014 0820   CALCIUM 8.7 02/03/2016 0856   PROT 6.2 02/03/2016 0856   ALBUMIN 3.6 02/03/2016 0856   AST 40 02/03/2016 0856   ALT 20 02/03/2016 0856   ALKPHOS 82 02/03/2016 0856   BILITOT 0.8 02/03/2016 0856   GFRNONAA 99 02/03/2016 0856   GFRNONAA >89 05/05/2012 1331   GFRAA 114 02/03/2016 0856   GFRAA >89 05/05/2012 1331        05/12/2015:  [place] RUQ: ? Thrombus IMPRESSION: Significant amount of nonocclusive thrombus in the main portal vein near the liver. Some flow is detected in this segment of the main portal vein. Intrahepatic portal vein branches appear to be normally patent.  09/28/2014 Liver biopsy: (following cholecystectomy) Cirrhosis, Minimal macrovesicular steatosis. Mild portal inflammation. No evidence of active hepatitis, Mallory's hyaline, cholestasis, bile duct abnormalities.     05/08/2011 Colonoscopy with snare polypectomy and Hemoclip application.  Impression:  Examination performed to cecum. Two small polyps ablated via cold biopsy and submitted together(ascending and transverse colon). 6 mm and 10 mm polyp snared from proximal sigmoid colon and submitted together. One Hemoclip applied to each site to  reduce the risk of post-polypectomy bleed given he has thrombocytopenia and portal hypertension. Mild sigmoid colon diverticulosis. Internal hemorrhoids patent.  04/21/2014 EGD  Indications: Patient is 51 year old Caucasian male was advanced cirrhosis and is undergoing EGD with possible esophageal variceal banding. Last EGD was in June 2014 and he was found to  Impression: Two short columns of grade 1 esophageal varices. Portal gastropathy with antral nodules. Biopsy of these nodules in February 2013 revealed changes of portal hypertension. Speck blood noted just below the Z line secondary to portal gastropathy.have grade 1  esophageal varices.   Review of Systems Past Medical History:  Diagnosis Date  . Anxiety   . Atrial fibrillation ([place])   . CHF (congestive heart failure) ([place])   . Cirrhosis ([place])   . Cirrhosis ([place])    NASH  . Constipation   . COPD (chronic obstructive pulmonary disease) ([place])    with cpap  . Diabetes mellitus    type 2  . Genu varus   . GERD (gastroesophageal reflux disease)   . Gout   . Heart murmur   . Leukopenia 07/08/2015  . Neuromuscular disorder (HCC)    neuropathy in hands and feet  . Psoriasis   . RA (rheumatoid arthritis) ([place])   . Sleep apnea    cpap used- level 10 and greater  . Thrombocytopenia ([place])     Past Surgical History:  Procedure Laterality Date  . ANKLE SURGERY     right ankle talor repair  . CHOLECYSTECTOMY  sept 2016  . CHOLECYSTECTOMY    . COLONOSCOPY  05/08/2011   Procedure: COLONOSCOPY;  Surgeon: Rogene Houston, MD;  [facility];  Service: Endoscopy;  Laterality: N/A;  730  . ESOPHAGOGASTRODUODENOSCOPY  02/28/2011   Procedure: ESOPHAGOGASTRODUODENOSCOPY (EGD);  Surgeon: Rogene Houston, MD;  Location: [facility];  Service: Endoscopy;  Laterality: N/A;  1200  . ESOPHAGOGASTRODUODENOSCOPY N/A 06/11/2012   Procedure: ESOPHAGOGASTRODUODENOSCOPY (EGD);  Surgeon: Rogene Houston, MD;  Location: [facility];  Service: Endoscopy;  Laterality: N/A;  1200  FYI patient is 400 pounds  . ESOPHAGOGASTRODUODENOSCOPY (EGD) WITH PROPOFOL N/A 04/21/2014   Procedure: ESOPHAGOGASTRODUODENOSCOPY (EGD) WITH PROPOFOL;  Surgeon: Rogene Houston, MD;  Location: [facility] ORS;  Service: Endoscopy;  Laterality: N/A;  . HERNIA REPAIR Right    as child; inguinal  . HERNIA REPAIR  sept 2016  . LIVER BIOPSY  2012  . SCIATIC NERVE EXPLORATION    . TONSILLECTOMY      Allergies  Allergen Reactions  . Oxycodone Base Itching and Nausea And Vomiting  . Prozac [Fluoxetine Hcl] Itching    Current Outpatient Prescriptions on File Prior to Visit    Medication Sig Dispense Refill  . albuterol (PROAIR HFA) 108 (90 Base) MCG/ACT inhaler Inhale 2 puffs into the lungs every 6 (six) hours as needed for wheezing or shortness of breath. 8.5 g 4  . ALPRAZolam (XANAX) 0.5 MG tablet     . aspirin EC 81 MG tablet Take 81 mg by mouth every 6 (six) hours as needed (CHEST PAIN).    Marland Kitchen betamethasone dipropionate (DIPROLENE) 0.05 % cream APPLY TO AFFECTED AREA TWICE A DAY (Patient taking differently: as needed) 45 g 3  . colchicine 0.6 MG tablet TAKE ONE TABLET TWICE DAILY AS NEEDED 60 tablet 5  . diltiazem (CARDIZEM SR) 90 MG 12 hr capsule Take 1 capsule (90 mg total) by mouth daily as needed (palpitations). 30 capsule 11  . diltiazem (TIAZAC) 300 MG 24 hr capsule  Take 300 mg by mouth daily.     Marland Kitchen doxepin (SINEQUAN) 10 MG capsule TAKE ONE CAPSULE BY MOUTH AT BEDTIME AS NEEDED 30 capsule 2  . ezetimibe (ZETIA) 10 MG tablet Take 1 tablet (10 mg total) by mouth daily. 30 tablet 11  . fluticasone (FLONASE) 50 MCG/ACT nasal spray Place 2 sprays into both nostrils daily as needed. For allergies 16 g 5  . folic acid (FOLVITE) 774 MCG tablet Take 400 mcg by mouth daily.    Marland Kitchen gabapentin (NEURONTIN) 600 MG tablet Take 1 tablet (600 mg total) by mouth 3 (three) times daily. 90 tablet 3  . glucose blood test strip 1 each by Other route as needed for other. Use as instructed    . hydrocortisone 1 % ointment Apply 1 application topically 3 (three) times daily as needed. 30 g 1  . ipratropium (ATROVENT) 0.02 % nebulizer solution Take 2.5 mLs (500 mcg total) by nebulization 4 (four) times daily as needed. For wheezing 75 mL 3  . lactulose (CHRONULAC) 10 GM/15ML solution Take 30 mLs (20 g total) by mouth 2 (two) times daily. 1892 mL 5  . lidocaine (XYLOCAINE) 5 % ointment Apply 1 application topically 4 (four) times daily as needed. To knees and ankle as needed for pain 35.44 g 6  . LINZESS 145 MCG CAPS capsule     . Magnesium 400 MG CAPS Take 400 mg by mouth every  morning. 30 capsule 3  . Menthol-Methyl Salicylate (MUSCLE RUB) 10-15 % CREA Apply 1 application topically as needed. For muscle pain    . metFORMIN (GLUCOPHAGE) 500 MG tablet TAKE ONE TABLET TWICE A DAY WITH FOOD 60 tablet 4  . metolazone (ZAROXOLYN) 2.5 MG tablet TAKE 1 TABLET DAILY 1 1/2 HOUR PRIOR TO TORSEMIDE FOR 3 DAYS AS NEEDED FOR SEVERE SWELLING 30 tablet 0  . milk thistle 175 MG tablet Take 175 mg by mouth 4 (four) times daily.     Marland Kitchen morphine (MS CONTIN) 60 MG 12 hr tablet     . morphine (MSIR) 15 MG tablet Take 1 tablet by mouth 2 (two) times daily as needed.    . Multiple Vitamins-Minerals (MULTI COMPLETE PO) Take 1 tablet by mouth daily.     Marland Kitchen NARCAN 4 MG/0.1ML LIQD     . neomycin-bacitracin-polymyxin (NEOSPORIN) ointment Apply 1 application topically every 12 (twelve) hours as needed. Reported on 06/01/2015    . nitroGLYCERIN (NITROSTAT) 0.4 MG SL tablet Place 1 tablet (0.4 mg total) under the tongue every 5 (five) minutes as needed for chest pain (may repeat x3). 25 tablet 11  . pantoprazole (PROTONIX) 40 MG tablet     . phenylephrine (NASAL DECONGESTANT) 1 % nasal spray Place 1 drop into the nose every 4 (four) hours as needed. For allergies    . potassium chloride SA (K-DUR,KLOR-CON) 20 MEQ tablet Take 1 tablet (20 mEq total) by mouth 2 (two) times daily. 60 tablet 6  . propranolol (INDERAL) 20 MG tablet Take 20 mg by mouth.    . Testosterone (ANDROGEL) 20.25 MG/1.25GM (1.62%) GEL Place onto the skin.    Marland Kitchen torsemide (DEMADEX) 20 MG tablet Take 1 tablet (20 mg total) by mouth 3 (three) times daily. 90 tablet 4  . ULORIC 40 MG tablet     . VOLTAREN 1 % GEL Apply 4 g topically 4 (four) times daily. 100 g 4   No current facility-administered medications on file prior to visit.        Objective:  Physical Exam Blood pressure 130/70, pulse 73, temperature 98 F (36.7 C), temperature source Oral, resp. rate 18, height 6' 10"  (2.083 m), weight (!) 336 lb 9.6 oz (152.7  kg).  Alert and oriented. Skin warm and dry. Oral mucosa is moist.   . Sclera anicteric, conjunctivae is pink. Thyroid not enlarged. No cervical lymphadenopathy. Lungs clear. Heart regular rate and rhythm.  Abdomen is soft. Bowel sounds are positive. No hepatomegaly. No abdominal masses felt. No tenderness.  No edema to lower extremities.       Assessment & Plan:  Cirrhosis. Am going to get a US abdomen. OV in 6 months.  Thrombocytopenia related to his liver disease.

## 2016-02-23 NOTE — Patient Instructions (Signed)
US abdomen OV in 6 months

## 2016-02-24 ENCOUNTER — Telehealth: Payer: Self-pay | Admitting: Family Medicine

## 2016-02-24 NOTE — Telephone Encounter (Signed)
Spoke with Aaron Edelman at [facility].  Mag oxide is over the counter and insurance will not pay for it.  Patient is not using pro air or methocarbamol .

## 2016-02-24 NOTE — Telephone Encounter (Signed)
Have not rec'd anything from pharmacy  Will work on next week when I do prior [employer]

## 2016-02-27 DIAGNOSIS — K7460 Unspecified cirrhosis of liver: Secondary | ICD-10-CM | POA: Diagnosis not present

## 2016-02-27 DIAGNOSIS — J309 Allergic rhinitis, unspecified: Secondary | ICD-10-CM | POA: Diagnosis not present

## 2016-02-27 DIAGNOSIS — G4700 Insomnia, unspecified: Secondary | ICD-10-CM | POA: Diagnosis not present

## 2016-02-27 DIAGNOSIS — M1A9XX0 Chronic gout, unspecified, without tophus (tophi): Secondary | ICD-10-CM | POA: Diagnosis not present

## 2016-02-27 DIAGNOSIS — Z0000 Encounter for general adult medical examination without abnormal findings: Secondary | ICD-10-CM | POA: Diagnosis not present

## 2016-02-27 DIAGNOSIS — R69 Illness, unspecified: Secondary | ICD-10-CM | POA: Diagnosis not present

## 2016-02-27 DIAGNOSIS — M069 Rheumatoid arthritis, unspecified: Secondary | ICD-10-CM | POA: Diagnosis not present

## 2016-02-27 DIAGNOSIS — R600 Localized edema: Secondary | ICD-10-CM | POA: Diagnosis not present

## 2016-02-27 DIAGNOSIS — I209 Angina pectoris, unspecified: Secondary | ICD-10-CM | POA: Diagnosis not present

## 2016-02-27 DIAGNOSIS — E785 Hyperlipidemia, unspecified: Secondary | ICD-10-CM | POA: Diagnosis not present

## 2016-02-27 DIAGNOSIS — Z6841 Body Mass Index (BMI) 40.0 and over, adult: Secondary | ICD-10-CM | POA: Diagnosis not present

## 2016-02-27 DIAGNOSIS — I499 Cardiac arrhythmia, unspecified: Secondary | ICD-10-CM | POA: Diagnosis not present

## 2016-02-27 DIAGNOSIS — I509 Heart failure, unspecified: Secondary | ICD-10-CM | POA: Diagnosis not present

## 2016-03-12 ENCOUNTER — Other Ambulatory Visit: Payer: Self-pay | Admitting: Family Medicine

## 2016-03-12 DIAGNOSIS — R600 Localized edema: Secondary | ICD-10-CM

## 2016-03-13 ENCOUNTER — Ambulatory Visit (HOSPITAL_COMMUNITY): Admission: RE | Admit: 2016-03-13 | Payer: Medicare HMO | Source: Ambulatory Visit

## 2016-03-14 ENCOUNTER — Other Ambulatory Visit (HOSPITAL_COMMUNITY): Payer: Self-pay | Admitting: Oncology

## 2016-03-14 ENCOUNTER — Encounter (HOSPITAL_COMMUNITY): Payer: Self-pay | Admitting: Oncology

## 2016-03-14 ENCOUNTER — Encounter (HOSPITAL_COMMUNITY): Payer: Medicare HMO

## 2016-03-14 ENCOUNTER — Ambulatory Visit (HOSPITAL_COMMUNITY): Payer: Medicare HMO | Admitting: Hematology & Oncology

## 2016-03-14 ENCOUNTER — Other Ambulatory Visit (HOSPITAL_COMMUNITY): Payer: Medicare HMO

## 2016-03-14 ENCOUNTER — Encounter (HOSPITAL_COMMUNITY): Payer: Medicare HMO | Attending: Oncology | Admitting: Oncology

## 2016-03-14 VITALS — BP 122/87 | HR 65 | Temp 98.1°F | Resp 20 | Wt 334.8 lb

## 2016-03-14 DIAGNOSIS — K7460 Unspecified cirrhosis of liver: Secondary | ICD-10-CM

## 2016-03-14 DIAGNOSIS — R69 Illness, unspecified: Secondary | ICD-10-CM | POA: Diagnosis not present

## 2016-03-14 DIAGNOSIS — M109 Gout, unspecified: Secondary | ICD-10-CM | POA: Diagnosis not present

## 2016-03-14 DIAGNOSIS — D6959 Other secondary thrombocytopenia: Secondary | ICD-10-CM | POA: Insufficient documentation

## 2016-03-14 DIAGNOSIS — F419 Anxiety disorder, unspecified: Secondary | ICD-10-CM | POA: Diagnosis not present

## 2016-03-14 DIAGNOSIS — K219 Gastro-esophageal reflux disease without esophagitis: Secondary | ICD-10-CM | POA: Diagnosis not present

## 2016-03-14 DIAGNOSIS — G4730 Sleep apnea, unspecified: Secondary | ICD-10-CM | POA: Insufficient documentation

## 2016-03-14 DIAGNOSIS — Z9889 Other specified postprocedural states: Secondary | ICD-10-CM | POA: Insufficient documentation

## 2016-03-14 DIAGNOSIS — R790 Abnormal level of blood mineral: Secondary | ICD-10-CM

## 2016-03-14 DIAGNOSIS — D72819 Decreased white blood cell count, unspecified: Secondary | ICD-10-CM | POA: Insufficient documentation

## 2016-03-14 DIAGNOSIS — I509 Heart failure, unspecified: Secondary | ICD-10-CM | POA: Insufficient documentation

## 2016-03-14 DIAGNOSIS — D696 Thrombocytopenia, unspecified: Secondary | ICD-10-CM

## 2016-03-14 DIAGNOSIS — Z801 Family history of malignant neoplasm of trachea, bronchus and lung: Secondary | ICD-10-CM | POA: Insufficient documentation

## 2016-03-14 DIAGNOSIS — Z808 Family history of malignant neoplasm of other organs or systems: Secondary | ICD-10-CM | POA: Diagnosis not present

## 2016-03-14 DIAGNOSIS — K7030 Alcoholic cirrhosis of liver without ascites: Secondary | ICD-10-CM | POA: Insufficient documentation

## 2016-03-14 DIAGNOSIS — Z7289 Other problems related to lifestyle: Secondary | ICD-10-CM

## 2016-03-14 DIAGNOSIS — M069 Rheumatoid arthritis, unspecified: Secondary | ICD-10-CM | POA: Diagnosis not present

## 2016-03-14 DIAGNOSIS — R161 Splenomegaly, not elsewhere classified: Secondary | ICD-10-CM

## 2016-03-14 DIAGNOSIS — J449 Chronic obstructive pulmonary disease, unspecified: Secondary | ICD-10-CM | POA: Diagnosis not present

## 2016-03-14 DIAGNOSIS — Z5189 Encounter for other specified aftercare: Secondary | ICD-10-CM | POA: Insufficient documentation

## 2016-03-14 DIAGNOSIS — E119 Type 2 diabetes mellitus without complications: Secondary | ICD-10-CM | POA: Diagnosis not present

## 2016-03-14 DIAGNOSIS — Z9049 Acquired absence of other specified parts of digestive tract: Secondary | ICD-10-CM | POA: Diagnosis not present

## 2016-03-14 DIAGNOSIS — D708 Other neutropenia: Secondary | ICD-10-CM

## 2016-03-14 DIAGNOSIS — D61818 Other pancytopenia: Secondary | ICD-10-CM | POA: Insufficient documentation

## 2016-03-14 LAB — FERRITIN: FERRITIN: 29 ng/mL (ref 24–336)

## 2016-03-14 LAB — CBC WITH DIFFERENTIAL/PLATELET
BASOS ABS: 0 10*3/uL (ref 0.0–0.1)
Basophils Relative: 2 %
Eosinophils Absolute: 0.1 10*3/uL (ref 0.0–0.7)
Eosinophils Relative: 5 %
HCT: 32.6 % — ABNORMAL LOW (ref 39.0–52.0)
HEMOGLOBIN: 10.4 g/dL — AB (ref 13.0–17.0)
LYMPHS PCT: 39 %
Lymphs Abs: 0.6 10*3/uL — ABNORMAL LOW (ref 0.7–4.0)
MCH: 23.8 pg — AB (ref 26.0–34.0)
MCHC: 31.9 g/dL (ref 30.0–36.0)
MCV: 74.6 fL — ABNORMAL LOW (ref 78.0–100.0)
MONOS PCT: 8 %
Monocytes Absolute: 0.1 10*3/uL (ref 0.1–1.0)
NEUTROS ABS: 0.8 10*3/uL — AB (ref 1.7–7.7)
Neutrophils Relative %: 46 %
Platelets: 61 10*3/uL — ABNORMAL LOW (ref 150–400)
RBC: 4.37 MIL/uL (ref 4.22–5.81)
RDW: 16.1 % — ABNORMAL HIGH (ref 11.5–15.5)
WBC: 1.6 10*3/uL — ABNORMAL LOW (ref 4.0–10.5)

## 2016-03-14 MED ORDER — POLYSACCHAR IRON-FA-B12 150-1-25 MG-MG-MCG PO CAPS: 1.0000 | ORAL_CAPSULE | Freq: Every day | ORAL | 11 refills | Status: DC

## 2016-03-14 NOTE — Progress Notes (Signed)
[name] File, MD [place] [address]  No diagnosis found.  CURRENT THERAPY: Observation  INTERVAL HISTORY: [name] 51 y.o. male returns for followup of Long standing leukopenia/thrombocytopenia secondary to cirrhosis due to EtOH and possible NAFLD and splenomegaly.  S/P bone marrow aspiration and biopsy on 10/24/2015 that demonstrated normocellular bone marrow with trilineage hematopoiesis and normal cytogenetics.   He has been doing well. He has cut back on drinking alcohol, but is still drinking. He has a large abdominal hernia that is causing some pain. He would like to be able to get this surgically fixed. Denies bleeding, melena, blood in stool, or any other concerns. His PCP is Dr. Wendi Snipes, who also does blood work. He has been taking torsemide that causes frequent urination and he states its caused him to lose weight. He has lost 16 pounds. He sees his liver doctor every 4-6 months.   Review of Systems  Constitutional: Negative.   HENT: Negative.   Eyes: Negative.   Respiratory: Negative.   Cardiovascular: Negative.   Gastrointestinal: Positive for abdominal pain (from hernia). Negative for blood in stool and melena.  Genitourinary: Negative.   Musculoskeletal: Negative.   Skin: Negative.   Neurological: Negative.   Endo/Heme/Allergies: Negative.  Does not bruise/bleed easily.  Psychiatric/Behavioral: Negative.   All other systems reviewed and are negative.  Past Medical History:  Diagnosis Date  . Anxiety   . Atrial fibrillation ([place])   . CHF (congestive heart failure) ([place])   . Cirrhosis ([place])   . Cirrhosis ([place])    NASH  . Constipation   . COPD (chronic obstructive pulmonary disease) ([place])    with cpap  . Diabetes mellitus    type 2  . Genu varus   . GERD (gastroesophageal reflux disease)   . Gout   . Heart murmur   . Leukopenia 07/08/2015  . Neuromuscular disorder (HCC)    neuropathy in hands and feet  . Psoriasis   .  RA (rheumatoid arthritis) ([place])   . Sleep apnea    cpap used- level 10 and greater  . Thrombocytopenia ([place])     Past Surgical History:  Procedure Laterality Date  . ANKLE SURGERY     right ankle talor repair  . CHOLECYSTECTOMY  sept 2016  . CHOLECYSTECTOMY    . COLONOSCOPY  05/08/2011   Procedure: COLONOSCOPY;  Surgeon: Rogene Houston, MD;  [facility];  Service: Endoscopy;  Laterality: N/A;  730  . ESOPHAGOGASTRODUODENOSCOPY  02/28/2011   Procedure: ESOPHAGOGASTRODUODENOSCOPY (EGD);  Surgeon: Rogene Houston, MD;  Location: [facility];  Service: Endoscopy;  Laterality: N/A;  1200  . ESOPHAGOGASTRODUODENOSCOPY N/A 06/11/2012   Procedure: ESOPHAGOGASTRODUODENOSCOPY (EGD);  Surgeon: Rogene Houston, MD;  Location: [facility];  Service: Endoscopy;  Laterality: N/A;  1200  FYI patient is 400 pounds  . ESOPHAGOGASTRODUODENOSCOPY (EGD) WITH PROPOFOL N/A 04/21/2014   Procedure: ESOPHAGOGASTRODUODENOSCOPY (EGD) WITH PROPOFOL;  Surgeon: Rogene Houston, MD;  Location: [facility]S;  Service: Endoscopy;  Laterality: N/A;  . HERNIA REPAIR Right    as child; inguinal  . HERNIA REPAIR  sept 2016  . LIVER BIOPSY  2012  . SCIATIC NERVE EXPLORATION    . TONSILLECTOMY      Family History  Problem Relation Age of Onset  . Colon cancer Mother   . Cancer Mother     intestine  . Cancer Father     throat  . Cancer  Brother     brain  . Cancer Sister   . Heart attack Neg Hx   . Stroke Neg Hx     Social History   Social History  . Marital status: Divorced    Spouse name: N/A  . Number of children: N/A  . Years of education: N/A   Social History Main Topics  . Smoking status: Never Smoker  . Smokeless tobacco: Current User    Types: Snuff, Chew     Comment: Pt reports that he "dips"  . Alcohol use No     Comment: stopped drinking 2-3 yrs ago.   . Drug use: No     Comment: Use to smoke cocaine and marijuana. No IV drug use  . Sexual activity: Yes   Other Topics Concern  .  Not on Gray   Social History Narrative  . No narrative on Gray     PHYSICAL EXAMINATION  ECOG PERFORMANCE STATUS: 1 - Symptomatic but completely ambulatory  Vitals:   03/14/16 1001  BP: 122/87  Pulse: 65  Resp: 20  Temp: 98.1 F (36.7 C)    Physical Exam  Constitutional: He is oriented to person, place, and time and well-developed, well-nourished, and in no distress.  Poor hygiene   HENT:  Head: Normocephalic and atraumatic.  Mouth/Throat: Oropharynx is clear and moist.  Eyes: Conjunctivae and EOM are normal. Pupils are equal, round, and reactive to light.  Neck: Normal range of motion. Neck supple.  Cardiovascular: Normal rate, regular rhythm and normal heart sounds.   Pulmonary/Chest: Effort normal and breath sounds normal.  Abdominal: Soft. Bowel sounds are normal.  Large umbilical hernia  Musculoskeletal: Normal range of motion.  Neurological: He is alert and oriented to person, place, and time. Gait normal.  Skin: Skin is warm and dry.  Nursing note and vitals reviewed.  LABORATORY DATA: CBC    Component Value Date/Time   WBC 1.6 (L) 03/14/2016 0850   RBC 4.37 03/14/2016 0850   HGB 10.4 (L) 03/14/2016 0850   HCT 32.6 (L) 03/14/2016 0850   HCT 26.9 (L) 02/03/2016 0856   PLT 61 (L) 03/14/2016 0850   PLT 59 (LL) 02/03/2016 0856   MCV 74.6 (L) 03/14/2016 0850   MCV 80 02/03/2016 0856   MCH 23.8 (L) 03/14/2016 0850   MCHC 31.9 03/14/2016 0850   RDW 16.1 (H) 03/14/2016 0850   RDW 16.6 (H) 02/03/2016 0856   LYMPHSABS PENDING 03/14/2016 0850   LYMPHSABS 0.6 (L) 02/03/2016 0856   MONOABS PENDING 03/14/2016 0850   EOSABS PENDING 03/14/2016 0850   EOSABS 0.1 02/03/2016 0856   BASOSABS PENDING 03/14/2016 0850   BASOSABS 0.0 02/03/2016 0856      Chemistry      Component Value Date/Time   NA 139 02/03/2016 0856   K 3.3 (L) 02/03/2016 0856   CL 98 02/03/2016 0856   CO2 24 02/03/2016 0856   BUN 11 02/03/2016 0856   CREATININE 0.88 02/03/2016 0856    CREATININE 0.67 (L) 09/06/2014 0820      Component Value Date/Time   CALCIUM 8.7 02/03/2016 0856   ALKPHOS 82 02/03/2016 0856   AST 40 02/03/2016 0856   ALT 20 02/03/2016 0856   BILITOT 0.8 02/03/2016 0856       ASSESSMENT AND PLAN:  Leukopenia Long standing leukopenia/thrombocytopenia secondary to cirrhosis due to EtOH and possible NAFLD and splenomegaly.  S/P bone marrow aspiration and biopsy on 10/24/2015 that demonstrated normocellular bone marrow with trilineage hematopoiesis and normal cytogenetics.  PLAN: Pancytopenia stable.  I strongly encouraged him to stop drinking to prevent further liver damage and to avoid any hepatotoxic drugs including tylenol.  He will keep following up with his PCP and GI .   Patient wishes to come less frequently to decrease his medical costs. He will return for follow up in 6 months with labs including CBC, CMP, iron studies, folate, B12 level.    All questions were answered. The patient knows to call the clinic with any problems, questions or concerns. We can certainly see the patient much sooner if necessary.  This document serves as a record of services personally performed by Twana First, MD. It was created on her behalf by Martinique Casey, a trained medical scribe. The creation of this record is based on the scribe's personal observations and the provider's statements to them. This document has been checked and approved by the attending provider.  I have reviewed the above documentation for accuracy and completeness and I agree with the above.  This note is electronically signed by: Martinique M Casey 03/14/2016 9:59 AM

## 2016-03-14 NOTE — Patient Instructions (Signed)
[place] at [facility] Discharge Instructions  RECOMMENDATIONS MADE BY THE CONSULTANT AND ANY TEST RESULTS WILL BE SENT TO YOUR REFERRING PHYSICIAN.  You were seen today by Dr. Twana First Follow up in 6 weeks with labs See Amy up front for appointments   Thank you for choosing [place] at [facility] to provide your oncology and hematology care.  To afford each patient quality time with our provider, please arrive at least 15 minutes before your scheduled appointment time.    If you have a lab appointment with the [facility] please come in thru the  Main Entrance and check in at the main information desk  You need to re-schedule your appointment should you arrive 10 or more minutes late.  We strive to give you quality time with our providers, and arriving late affects you and other patients whose appointments are after yours.  Also, if you no show three or more times for appointments you may be dismissed from the clinic at the providers discretion.     Again, thank you for choosing [facility].  Our hope is that these requests will decrease the amount of time that you wait before being seen by our physicians.       _____________________________________________________________  Should you have questions after your visit to [facility], please contact our office at [phone] between the hours of 8:30 a.m. and 4:30 p.m.  Voicemails left after 4:30 p.m. will not be returned until the following business day.  For prescription refill requests, have your pharmacy contact our office.       Resources For Cancer Patients and their Caregivers ? American Cancer Society: Can assist with transportation, wigs, general needs, runs Look Good Feel Better.        203-276-3200 ? Cancer Care: Provides financial assistance, online support groups, medication/co-pay assistance.  1-800-813-HOPE 920-315-6775) ? [facility] Assists [place] cancer patients and their families through emotional , educational and financial support.  743-458-1843 ? [place] DSS Where to apply for food stamps, Medicaid and utility assistance. 226-115-9349 ? [facility]: Transportation to medical appointments. (816) 512-8680 ? Social Security Administration: May apply for disability if have a Stage IV cancer. 256 664 3690 210-849-6682 ? [employer], Disability and Transit Services: Assists with nutrition, care and transit needs. [place] Support Programs: @10RELATIVEDAYS @ > Cancer Support Group  2nd Tuesday of the month 1pm-2pm, Journey Room  > Creative Journey  3rd Tuesday of the month 1130am-1pm, Journey Room  > Look Good Feel Better  1st Wednesday of the month 10am-12 noon, Journey Room (Call [place] to register [phone])

## 2016-03-19 DIAGNOSIS — M170 Bilateral primary osteoarthritis of knee: Secondary | ICD-10-CM | POA: Diagnosis not present

## 2016-03-19 DIAGNOSIS — G894 Chronic pain syndrome: Secondary | ICD-10-CM | POA: Diagnosis not present

## 2016-03-19 DIAGNOSIS — Z79891 Long term (current) use of opiate analgesic: Secondary | ICD-10-CM | POA: Diagnosis not present

## 2016-03-19 DIAGNOSIS — M48061 Spinal stenosis, lumbar region without neurogenic claudication: Secondary | ICD-10-CM | POA: Diagnosis not present

## 2016-03-30 ENCOUNTER — Other Ambulatory Visit: Payer: Self-pay | Admitting: *Deleted

## 2016-03-30 ENCOUNTER — Telehealth: Payer: Self-pay | Admitting: Family Medicine

## 2016-03-30 MED ORDER — VOLTAREN 1 % TD GEL: 4.0000 g | Freq: Four times a day (QID) | TRANSDERMAL | 1 refills | Status: DC

## 2016-03-30 NOTE — Progress Notes (Signed)
Call to pt to inform he needs OV for refills Pt has not had INR checked since 01/2016 Also informed pt that Androgel had been d/ced Scheduled appt for pt to come in to discuss

## 2016-03-30 NOTE — Telephone Encounter (Signed)
What is the name of the medication? Volteran Gel 1.5 % and Magnesium 400 mg. Wants higher dose on Magnesium if possible. Also needs Androgel. Wants 2 of the Androgel. Patient is almost out of all.  Have you contacted your pharmacy to request a refill? Yes   Which pharmacy would you like this sent to? [facility] -- Patient switched pharmacy   Patient notified that their request is being sent to the clinical staff for review and that they should receive a call once it is complete. If they do not receive a call within 24 hours they can check with their pharmacy or our office.

## 2016-04-09 ENCOUNTER — Other Ambulatory Visit: Payer: Self-pay | Admitting: Family Medicine

## 2016-04-09 ENCOUNTER — Telehealth: Payer: Self-pay | Admitting: Family Medicine

## 2016-04-10 ENCOUNTER — Telehealth: Payer: Self-pay | Admitting: Family Medicine

## 2016-04-10 ENCOUNTER — Other Ambulatory Visit (HOSPITAL_COMMUNITY): Payer: Medicare HMO

## 2016-04-10 ENCOUNTER — Ambulatory Visit (HOSPITAL_COMMUNITY)
Admission: RE | Admit: 2016-04-10 | Discharge: 2016-04-10 | Disposition: A | Discharge status: Home / Self Care | Payer: Medicare HMO | Source: Ambulatory Visit | Attending: Internal Medicine | Admitting: Internal Medicine

## 2016-04-10 ENCOUNTER — Ambulatory Visit (INDEPENDENT_AMBULATORY_CARE_PROVIDER_SITE_OTHER): Payer: Medicare HMO | Admitting: Urology

## 2016-04-10 DIAGNOSIS — Z9049 Acquired absence of other specified parts of digestive tract: Secondary | ICD-10-CM | POA: Insufficient documentation

## 2016-04-10 DIAGNOSIS — I8290 Acute embolism and thrombosis of unspecified vein: Secondary | ICD-10-CM

## 2016-04-10 DIAGNOSIS — I81 Portal vein thrombosis: Secondary | ICD-10-CM | POA: Insufficient documentation

## 2016-04-10 DIAGNOSIS — N433 Hydrocele, unspecified: Secondary | ICD-10-CM

## 2016-04-10 DIAGNOSIS — R161 Splenomegaly, not elsewhere classified: Secondary | ICD-10-CM | POA: Insufficient documentation

## 2016-04-10 DIAGNOSIS — K7460 Unspecified cirrhosis of liver: Secondary | ICD-10-CM | POA: Diagnosis not present

## 2016-04-10 MED ORDER — PROPRANOLOL HCL 20 MG PO TABS: 20.0000 mg | ORAL_TABLET | Freq: Two times a day (BID) | ORAL | 5 refills | Status: DC

## 2016-04-10 NOTE — Telephone Encounter (Signed)
Patient aware that Propanolol has been sent to pharmacy

## 2016-04-10 NOTE — Telephone Encounter (Signed)
Rx filled.   [name], MD [facility] 04/10/2016, 7:42 AM

## 2016-04-10 NOTE — Telephone Encounter (Signed)
Appreciate FYI, will be glad to review results at his appt in 1 week.   The difficult question to address currently is, does he need to be anticoagulated and is it worth the risk with recent life threatening bleed. Would favor new NOACs opposed to coumadin with poor follow up previously. 10 mg xarelto may be good choice.   Appreciate GI's assistance.   [name], MD [facility] 04/10/2016, 1:00 PM

## 2016-04-12 DIAGNOSIS — I10 Essential (primary) hypertension: Secondary | ICD-10-CM | POA: Diagnosis not present

## 2016-04-12 DIAGNOSIS — G4733 Obstructive sleep apnea (adult) (pediatric): Secondary | ICD-10-CM | POA: Diagnosis not present

## 2016-04-12 DIAGNOSIS — J449 Chronic obstructive pulmonary disease, unspecified: Secondary | ICD-10-CM | POA: Diagnosis not present

## 2016-04-12 DIAGNOSIS — E662 Morbid (severe) obesity with alveolar hypoventilation: Secondary | ICD-10-CM | POA: Diagnosis not present

## 2016-04-13 ENCOUNTER — Ambulatory Visit: Payer: Medicare HMO | Admitting: Family Medicine

## 2016-04-17 ENCOUNTER — Other Ambulatory Visit: Payer: Self-pay | Admitting: Family Medicine

## 2016-04-17 ENCOUNTER — Encounter: Payer: Self-pay | Admitting: Family Medicine

## 2016-04-17 ENCOUNTER — Ambulatory Visit (INDEPENDENT_AMBULATORY_CARE_PROVIDER_SITE_OTHER): Payer: Medicare HMO | Admitting: Family Medicine

## 2016-04-17 VITALS — BP 97/66 | HR 73 | Temp 97.5°F | Ht >= 80 in | Wt 323.4 lb

## 2016-04-17 DIAGNOSIS — K7460 Unspecified cirrhosis of liver: Secondary | ICD-10-CM | POA: Diagnosis not present

## 2016-04-17 DIAGNOSIS — E119 Type 2 diabetes mellitus without complications: Secondary | ICD-10-CM

## 2016-04-17 DIAGNOSIS — I5032 Chronic diastolic (congestive) heart failure: Secondary | ICD-10-CM

## 2016-04-17 DIAGNOSIS — I480 Paroxysmal atrial fibrillation: Secondary | ICD-10-CM

## 2016-04-17 DIAGNOSIS — G894 Chronic pain syndrome: Secondary | ICD-10-CM | POA: Diagnosis not present

## 2016-04-17 LAB — BAYER DCA HB A1C WAIVED: HB A1C (BAYER DCA - WAIVED): 5.1 % (ref <7.0)

## 2016-04-17 MED ORDER — GABAPENTIN 800 MG PO TABS: 800.0000 mg | ORAL_TABLET | Freq: Three times a day (TID) | ORAL | 3 refills | Status: DC

## 2016-04-17 NOTE — Patient Instructions (Signed)
Great to see you!  I would like you ti make an appointment to see Dr, Percival Spanish for follow up of you ratrial fibrillation.   I will ask your GI providers to help me with the question of if you should not proceed with surgery due to the thrombosis an dif you need blood thinners.

## 2016-04-17 NOTE — Progress Notes (Signed)
HPI  Patient presents today here to follow-up for chronic medical conditions.  Chronic pain Patient with right chronic ankle pain, also bilateral chronic anterior leg pain from hip to knee. Described as a numbness and tingling type pain. Helped by gabapentin, would like increase. Patient was released from pain management due to being positive for alcohol on 3 different occasions, he states he has not had morphine in about 2 months, he requests Vicoprofen for a month while he gets a referral.  Chronic portal vein thrombosis Patient had life-threatening nosebleed causing hospitalization and severe drop in hemoglobin in December. We stopped Coumadin after that. He had poor follow-up for INR. It unclear whether he really needs anticoagulation for portal vein thrombosis. No abdominal pain, he reports recently drinking alcohol.  Type 2 diabetes Using metformin as needed for blood sugars over 140 Watching his diet, very proud of his weight loss.  Diastolic CHF States that his breathing is much better since losing some much weight. Leg swelling is stable.  Patient is frustrated with ventral hernia, he would like me to clear him for surgery disomfort but no severe pain in the hernia, no changes recently  PMH: Smoking status noted ROS: Per HPI  Objective: BP 97/66   Pulse 73   Temp 97.5 F (36.4 C) (Oral)   Ht 6' 10"  (2.083 m)   Wt (!) 323 lb 6.4 oz (146.7 kg)   BMI 33.82 kg/m  Gen: NAD, alert, cooperative with exam HEENT: NCAT CV: RRR, good S1/S2, no murmur Resp: CTABL, no wheezes, non-labored Abd: Large soft nontender ventral hernia around the umbilicus Ext: No edema, warm Neuro: Alert and oriented, No gross deficits  Depression screen [facility] 2/9 04/17/2016 02/03/2016 12/12/2015 10/31/2015 07/28/2015  Decreased Interest 2 0 0 0 3  Down, Depressed, Hopeless 0 0 0 0 0  PHQ - 2 Score 2 0 0 0 3  Altered sleeping 1 - - - 2  Tired, decreased energy 2 - - - 3  Change in appetite 0 -  - - 3  Feeling bad or failure about yourself  0 - - - 0  Trouble concentrating 0 - - - 0  Moving slowly or fidgety/restless 0 - - - 0  Suicidal thoughts 0 - - - 0  PHQ-9 Score 5 - - - 11  Some recent data might be hidden     Assessment and plan:  # Type 2 diabetes Diet controlled, recommended stopping metformin completely. A1c to be checked every 6 months  # Chronic portal vein thrombosis, cirrosis Recent ultrasound shows persistent thrombosis Unclear indication for anticoagulation. On review of literature it appears that there is at least moderate benefit with cirrhosis and anticoagulation, however Lovenox is usually the preferred agent. He had a severe bleed in December due to supratherapeutic INR after for follow-up for Coumadin. I believe he is high risk for recurrent bleed, for now unless there are stronger opinions from GI, I would recommend no anticoagulation for this problem  # Atrial fibrillation Rate controlled Patient has Chads vasc score of 2, anticoagulation is indicated Given his recent bleed I'm hesitant to restart anticoagulation Considered aspirin, however he has persistent thrombocytopenia from cirrhosis, I am unsure of safety in his case- no aspirin recommended at this time, last platelet count 65 Recommended cardiology evaluation for their opinion on the matter, appreciate recommendations, also patient would like to know about operative clearance. He has desire to have his umbilical/ventral hernia operated on as well as hydroceole addressed.    #  chronic Pain syndrome Multiple pain complaints, bilateral neuropathic anterior leg pain, persistent right ankle pain Previously seen by pain management,  recently released it alcohol use Increased gabapentin per his request Declined his request for narcotics Pain management referral written per his request  Diastolic CHF Breathing improved with weight loss ( 395 to 323 in 8 months) No med changes, appears  euvolemic    Orders Placed This Encounter  Procedures  . Bayer DCA Hb A1c Waived  . Ambulatory referral to Pain Clinic    Referral Priority:   Routine    Referral Type:   Consultation    Referral Reason:   Specialty Services Required    Requested Specialty:   Pain Medicine    Number of Visits Requested:   1    Meds ordered this encounter  Medications  . gabapentin (NEURONTIN) 800 MG tablet    Sig: Take 1 tablet (800 mg total) by mouth 3 (three) times daily.    Dispense:  90 tablet    Refill:  [place], MD [facility] 04/17/2016, 12:30 PM

## 2016-04-18 ENCOUNTER — Telehealth (INDEPENDENT_AMBULATORY_CARE_PROVIDER_SITE_OTHER): Payer: Self-pay | Admitting: Internal Medicine

## 2016-04-18 ENCOUNTER — Telehealth: Payer: Self-pay | Admitting: Cardiology

## 2016-04-18 ENCOUNTER — Telehealth: Payer: Self-pay | Admitting: Family Medicine

## 2016-04-18 NOTE — Telephone Encounter (Signed)
Pt says please tell Dr Warren Lacy to look at his [place] he had at [facility] on 04-10-16.Please be sure to tell him to look at the Portal Vein please.

## 2016-04-18 NOTE — Telephone Encounter (Signed)
error 

## 2016-04-18 NOTE — Telephone Encounter (Signed)
Please review and advise.

## 2016-04-19 ENCOUNTER — Telehealth: Payer: Self-pay | Admitting: Family Medicine

## 2016-04-19 NOTE — Telephone Encounter (Signed)
What is the name of the medication? Metolazone 2.84m.   Have you contacted your pharmacy to request a refill? No. This will be new at this pharmacy  Which pharmacy would you like this sent to? [facility]  Patient notified that their request is being sent to the clinical staff for review and that they should receive a call once it is complete. If they do not receive a call within 24 hours they can check with their pharmacy or our office.

## 2016-04-20 ENCOUNTER — Encounter: Payer: Self-pay | Admitting: Pharmacist

## 2016-04-20 ENCOUNTER — Telehealth: Payer: Self-pay | Admitting: *Deleted

## 2016-04-20 ENCOUNTER — Telehealth: Payer: Self-pay

## 2016-04-20 DIAGNOSIS — I81 Portal vein thrombosis: Secondary | ICD-10-CM

## 2016-04-20 NOTE — Telephone Encounter (Signed)
Spoke with [name], she reports the patients surgery can wait until after cardiology appt in June.

## 2016-04-20 NOTE — Telephone Encounter (Signed)
Mickel Baas returning a call to [place]. Thanks.

## 2016-04-20 NOTE — Telephone Encounter (Signed)
Clair Gulling from [facility] called wanting to know if pt can be switched to diclofenac 1% gel instead of the voltaren. Clair Gulling states it will safe patient money if switching. Covering PCP, please advise. Call back- [phone]

## 2016-04-20 NOTE — Telephone Encounter (Signed)
Yes go ahead and make the switch

## 2016-04-20 NOTE — Telephone Encounter (Signed)
Received fax patient is needing clearance for hydrocelectomy and also the patient is off anticoagulation and there is a question of the need. The patient has an appt in the [place] office in June. Called [facility] to find out if the appt needs to be moved up or not.

## 2016-04-20 NOTE — Telephone Encounter (Signed)
Aware. 

## 2016-04-22 NOTE — Telephone Encounter (Signed)
Thrombus in the portal vein needs to be addressed by [name], MD/GI.

## 2016-04-23 NOTE — Telephone Encounter (Signed)
Left message for patient of dr hochrein's recommendations.

## 2016-04-24 ENCOUNTER — Telehealth: Payer: Self-pay

## 2016-04-24 MED ORDER — METOLAZONE 2.5 MG PO TABS: ORAL_TABLET | ORAL | 0 refills | Status: DC

## 2016-04-24 NOTE — Telephone Encounter (Signed)
Testosterone was last checked in 6/207 and was normal without medication. Would not recommend treatment for now.   We can plan to check at his next OV and discuss.    [name], MD [facility] 04/24/2016, 7:30 AM

## 2016-04-24 NOTE — Telephone Encounter (Signed)
Pt aware of recommendations of discussing and checking levels at next visit

## 2016-04-24 NOTE — Telephone Encounter (Signed)
rx  Refilled.   [name], MD [facility] 04/24/2016, 7:32 AM

## 2016-04-24 NOTE — Telephone Encounter (Signed)
His last MRI was 05/2014 which showed broad based buldging disc at L3/L4. I do not believe this would be current enough for neurosurgery.   I am ok with a referral, however he will likely need a more recent work up.   Would need OV to discuss and document if they will not see him without MRI.   [name], MD [facility] 04/24/2016, 5:53 PM

## 2016-04-25 ENCOUNTER — Ambulatory Visit (HOSPITAL_COMMUNITY): Payer: Medicare HMO | Admitting: Oncology

## 2016-04-25 ENCOUNTER — Encounter: Payer: Self-pay | Admitting: *Deleted

## 2016-04-25 ENCOUNTER — Other Ambulatory Visit (HOSPITAL_COMMUNITY): Payer: Medicare HMO

## 2016-05-01 ENCOUNTER — Telehealth: Payer: Self-pay | Admitting: Family Medicine

## 2016-05-01 ENCOUNTER — Telehealth: Payer: Self-pay | Admitting: Cardiology

## 2016-05-01 ENCOUNTER — Telehealth: Payer: Self-pay

## 2016-05-01 MED ORDER — LIDOCAINE 5 % EX OINT: 1.0000 "application " | TOPICAL_OINTMENT | Freq: Four times a day (QID) | CUTANEOUS | 0 refills | Status: DC | PRN

## 2016-05-01 NOTE — Telephone Encounter (Signed)
Left message with Marlowe Kays at [facility] to call back

## 2016-05-01 NOTE — Telephone Encounter (Signed)
Patient aware to use votaren gel 4 times daily as needed

## 2016-05-01 NOTE — Telephone Encounter (Signed)
Spoke with the patient. He stated that he would like to go ahead and have the procedure if at all possible. He stated that he is uncomfortable. Will route to the physician for further instruction.

## 2016-05-01 NOTE — Telephone Encounter (Signed)
Error

## 2016-05-01 NOTE — Telephone Encounter (Signed)
New message      Request for surgical clearance:  What type of surgery is being performed?  hydrocelectomy 1. When is this surgery scheduled?  Pending clearance  Are there any medications that need to be held prior to surgery and how long?  Cardiac clearance 2. Name of physician performing surgery?  Dr Alyson Ingles  3. What is your office phone and fax number?  fax [phone]

## 2016-05-01 NOTE — Telephone Encounter (Signed)
Pharmacy states patient is requesting a refill on his lidocaine 5% ointment. Per Dr. Wendi Snipes it is okay to refill. Rx sent in

## 2016-05-03 ENCOUNTER — Telehealth: Payer: Self-pay

## 2016-05-03 NOTE — Telephone Encounter (Signed)
I do not see that we have prescribed, would recommend sending it to the prescribing physician.   [name], MD [facility] 05/03/2016, 4:49 PM

## 2016-05-06 NOTE — Telephone Encounter (Signed)
I cannot see him until he comes to the office.  I could see him in [place] [address].

## 2016-05-07 NOTE — Telephone Encounter (Signed)
Leave message for pt to call back.Marland KitchenMarland Kitchen

## 2016-05-08 ENCOUNTER — Telehealth: Payer: Self-pay | Admitting: *Deleted

## 2016-05-08 ENCOUNTER — Telehealth: Payer: Self-pay

## 2016-05-08 NOTE — Telephone Encounter (Signed)
Left message for patient to call and schedule appointment for pre op clearance with Dr. Percival Spanish (preferably in [facility] 05/09/16)

## 2016-05-08 NOTE — Telephone Encounter (Signed)
Pt have an appt 05/02 in [place] with Dr Percival Spanish

## 2016-05-08 NOTE — Telephone Encounter (Signed)
Noted.  Sending FYI

## 2016-05-08 NOTE — Progress Notes (Deleted)
HPI The patient returns for follow up of diastolic heart failure and a history of atrial fibrillation.   He is also being considered for hydrocelectomy.  He is also being followed by [name], MD for chronic portal vein thrombosis.  He is not having anticoagulation for this because of previous GI bleeding and difficulty complying for INR folllow.   ***    Since I last saw him he has been hospitalized twice.  I was able to retrieve results from [facility] and to review notes from his GI MD.  However, I have not been able review the results from his [facility] hospitalization.  In September at [facility] he had a laparoscopic cholecystectomy and umbilical hernia repair. He does have an enlarging umbilical hernia still is anxious to have this fixed. Of note with CT of his abdomen fairly recently he was noted to have an incidental finding of a near occlusive portal vein thrombosis with signs of portal hypertension and developing shunts. This was described in March 2017.  Of note is not entirely clear to me who is following his Coumadin. He seems to stop the medication at times and in particular mentions that he doesn't take it when he has gout. He reports that he was in more 2-3 months ago. Not entirely sure what this was for. He comes for follow-up of his atrial fibrillation. He still has bouts of this. He has been taking an extra diltiazem 90 mg when this happens he says this helps with his symptoms. He's actually lost about 50 pounds. He denies any new shortness of breath, PND or orthopnea. Denies any chest pressure, neck or arm discomfort. He has back and joint pains.  Allergies  Allergen Reactions  . Oxycodone Base Itching and Nausea And Vomiting  . Prozac [Fluoxetine Hcl] Itching    Current Outpatient Prescriptions  Medication Sig Dispense Refill  . albuterol (PROAIR HFA) 108 (90 Base) MCG/ACT inhaler Inhale 2 puffs into the lungs every 6 (six) hours as needed for wheezing or  shortness of breath. 8.5 g 4  . ALPRAZolam (XANAX) 0.5 MG tablet 1 mg 2 (two) times daily.     Marland Kitchen aspirin EC 81 MG tablet Take 81 mg by mouth every 6 (six) hours as needed (CHEST PAIN).    Marland Kitchen betamethasone dipropionate (DIPROLENE) 0.05 % cream APPLY TO AFFECTED AREA TWICE A DAY (Patient taking differently: as needed) 45 g 3  . colchicine 0.6 MG tablet TAKE ONE TABLET TWICE DAILY AS NEEDED 60 tablet 5  . diltiazem (CARDIZEM SR) 90 MG 12 hr capsule Take 1 capsule (90 mg total) by mouth daily as needed (palpitations). 30 capsule 11  . diltiazem (TIAZAC) 300 MG 24 hr capsule Take 300 mg by mouth daily.     Marland Kitchen doxepin (SINEQUAN) 10 MG capsule Take 1 Capsule by mouth at bedtime as needed 30 capsule 0  . ezetimibe (ZETIA) 10 MG tablet Take 1 tablet (10 mg total) by mouth daily. 30 tablet 11  . fluticasone (FLONASE) 50 MCG/ACT nasal spray Place 2 sprays into both nostrils daily as needed. For allergies 16 g 5  . folic acid (FOLVITE) 782 MCG tablet Take 400 mcg by mouth daily.    Marland Kitchen gabapentin (NEURONTIN) 800 MG tablet Take 1 tablet (800 mg total) by mouth 3 (three) times daily. 90 tablet 3  . glucose blood test strip 1 each by Other route as needed for other. Use as instructed    . hydrocortisone 1 % ointment Apply 1  application topically 3 (three) times daily as needed. 30 g 1  . ipratropium (ATROVENT) 0.02 % nebulizer solution Take 2.5 mLs (500 mcg total) by nebulization 4 (four) times daily as needed. For wheezing 75 mL 3  . lactulose (CHRONULAC) 10 GM/15ML solution Take 30 mLs (20 g total) by mouth 2 (two) times daily. 1892 mL 5  . lidocaine (XYLOCAINE) 5 % ointment Apply 1 application topically 4 (four) times daily as needed. To knees and ankle as needed for pain 35.44 g 0  . LINZESS 145 MCG CAPS capsule     . Magnesium 400 MG CAPS Take 400 mg by mouth every morning. 30 capsule 3  . Menthol-Methyl Salicylate (MUSCLE RUB) 10-15 % CREA Apply 1 application topically as needed. For muscle pain    .  metFORMIN (GLUCOPHAGE) 500 MG tablet TAKE ONE TABLET TWICE A DAY WITH FOOD 60 tablet 4  . metolazone (ZAROXOLYN) 2.5 MG tablet TAKE 1 TABLET DAILY 1 1/2 HOUR PRIOR TO TORSEMIDE FOR 3 DAYS AS NEEDED FOR SEVERE SWELLING 30 tablet 0  . milk thistle 175 MG tablet Take 175 mg by mouth 4 (four) times daily.     . Multiple Vitamins-Minerals (MULTI COMPLETE PO) Take 1 tablet by mouth daily.     Marland Kitchen NARCAN 4 MG/0.1ML LIQD     . neomycin-bacitracin-polymyxin (NEOSPORIN) ointment Apply 1 application topically every 12 (twelve) hours as needed. Reported on 06/01/2015    . nitroGLYCERIN (NITROSTAT) 0.4 MG SL tablet Place 1 tablet (0.4 mg total) under the tongue every 5 (five) minutes as needed for chest pain (may repeat x3). 25 tablet 11  . pantoprazole (PROTONIX) 40 MG tablet     . phenylephrine (NASAL DECONGESTANT) 1 % nasal spray Place 1 drop into the nose every 4 (four) hours as needed. For allergies    . Polysacchar Iron-FA-B12 (FERREX 150 FORTE) 150-1-25 MG-MG-MCG CAPS Take 1 capsule by mouth daily. 30 capsule 11  . potassium chloride SA (K-DUR,KLOR-CON) 20 MEQ tablet Take 1 tablet (20 mEq total) by mouth 2 (two) times daily. 60 tablet 6  . propranolol (INDERAL) 20 MG tablet Take 1 tablet (20 mg total) by mouth 2 (two) times daily. 60 tablet 5  . Testosterone (ANDROGEL) 20.25 MG/1.25GM (1.62%) GEL Place onto the skin.    Marland Kitchen torsemide (DEMADEX) 20 MG tablet TAKE ONE (1) TABLET THREE (3) TIMES EACH DAY 90 tablet 0  . ULORIC 40 MG tablet     . VOLTAREN 1 % GEL apply FOUR grams topically FOUR times daily 300 g 2   No current facility-administered medications for this visit.     Past Medical History:  Diagnosis Date  . Anxiety   . Atrial fibrillation ([place])   . CHF (congestive heart failure) ([place])   . Cirrhosis ([place])   . Cirrhosis ([place])    NASH  . Constipation   . COPD (chronic obstructive pulmonary disease) ([place])    with cpap  . Diabetes mellitus    type 2  . Genu varus   . GERD (gastroesophageal  reflux disease)   . Gout   . Heart murmur   . Leukopenia 07/08/2015  . Neuromuscular disorder ([facility])    neuropathy in hands and feet  . Psoriasis   . RA (rheumatoid arthritis) ([place])   . Sleep apnea    cpap used- level 10 and greater  . Thrombocytopenia ([place])     Past Surgical History:  Procedure Laterality Date  . ANKLE SURGERY     right ankle talor  repair  . CHOLECYSTECTOMY  sept 2016  . CHOLECYSTECTOMY    . COLONOSCOPY  05/08/2011   Procedure: COLONOSCOPY;  Surgeon: Rogene Houston, MD;  Location: [facility];  Service: Endoscopy;  Laterality: N/A;  730  . ESOPHAGOGASTRODUODENOSCOPY  02/28/2011   Procedure: ESOPHAGOGASTRODUODENOSCOPY (EGD);  Surgeon: Rogene Houston, MD;  Location: [facility];  Service: Endoscopy;  Laterality: N/A;  1200  . ESOPHAGOGASTRODUODENOSCOPY N/A 06/11/2012   Procedure: ESOPHAGOGASTRODUODENOSCOPY (EGD);  Surgeon: Rogene Houston, MD;  Location: [facility];  Service: Endoscopy;  Laterality: N/A;  1200  FYI patient is 400 pounds  . ESOPHAGOGASTRODUODENOSCOPY (EGD) WITH PROPOFOL N/A 04/21/2014   Procedure: ESOPHAGOGASTRODUODENOSCOPY (EGD) WITH PROPOFOL;  Surgeon: Rogene Houston, MD;  Location: [facility] ORS;  Service: Endoscopy;  Laterality: N/A;  . HERNIA REPAIR Right    as child; inguinal  . HERNIA REPAIR  sept 2016  . LIVER BIOPSY  2012  . SCIATIC NERVE EXPLORATION    . TONSILLECTOMY      ROS:  Back pain.  Otherwise as stated in the HPI and negative for all other systems.  PHYSICAL EXAM There were no vitals taken for this visit. GENERAL:  Well appearing NECK:  No jugular venous distention, waveform within normal limits, carotid upstroke brisk and symmetric, no bruits, no thyromegaly LUNGS:  Clear to auscultation bilaterally BACK:  No CVA tenderness CHEST:  Unremarkable HEART:  PMI not displaced or sustained,S1 and S2 within normal limits, no S3, no S4, no clicks, no rubs, no murmurs ABD:  Flat, positive bowel sounds normal in frequency in pitch, no  bruits, no rebound, no guarding, no midline pulsatile mass, no hepatomegaly, no splenomegaly, morbidly obese EXT:  2 plus pulses throughout, trace edema, no cyanosis no clubbing SKIN:  Psoriasis, chronic venous stasis changes left greater than right leg   EKG:  Sinus rhythm, rate ***, axis within normal limits, intervals within normal limits.  No acute ST T wave changes.  05/08/2016   ASSESSMENT AND PLAN  PREOP - ***  ATRIAL FIBRILLATION - Mr. Yolanda D Furber has a CHA2DS2 - VASc score of 2.  However, he is too high risk for anticoagulation.    OBESITY, MORBID -  *** We have discussed this at multiple appointments.  I am very proud of his weight loss and I encourage more of the same.   DIASTOLIC HEART FAILURE, CHRONIC - He seems to be ***euvolemic. No further evaluation or change in therapy is indicated.

## 2016-05-09 ENCOUNTER — Ambulatory Visit: Payer: Medicare HMO | Admitting: Cardiology

## 2016-05-14 ENCOUNTER — Ambulatory Visit: Payer: Medicare HMO | Admitting: Family Medicine

## 2016-05-15 ENCOUNTER — Other Ambulatory Visit: Payer: Self-pay | Admitting: Cardiology

## 2016-05-17 ENCOUNTER — Encounter (INDEPENDENT_AMBULATORY_CARE_PROVIDER_SITE_OTHER): Payer: Self-pay | Admitting: *Deleted

## 2016-05-18 ENCOUNTER — Encounter: Payer: Self-pay | Admitting: Nurse Practitioner

## 2016-05-18 ENCOUNTER — Ambulatory Visit (INDEPENDENT_AMBULATORY_CARE_PROVIDER_SITE_OTHER): Payer: Medicare HMO | Admitting: Nurse Practitioner

## 2016-05-18 ENCOUNTER — Other Ambulatory Visit: Payer: Self-pay | Admitting: Family Medicine

## 2016-05-18 ENCOUNTER — Ambulatory Visit: Payer: Medicare HMO | Admitting: Nurse Practitioner

## 2016-05-18 VITALS — BP 104/69 | HR 70 | Temp 97.0°F | Ht 69.0 in | Wt 330.0 lb

## 2016-05-18 DIAGNOSIS — R69 Illness, unspecified: Secondary | ICD-10-CM | POA: Diagnosis not present

## 2016-05-18 DIAGNOSIS — M25561 Pain in right knee: Secondary | ICD-10-CM | POA: Diagnosis not present

## 2016-05-18 DIAGNOSIS — M5442 Lumbago with sciatica, left side: Secondary | ICD-10-CM | POA: Diagnosis not present

## 2016-05-18 DIAGNOSIS — G8929 Other chronic pain: Secondary | ICD-10-CM

## 2016-05-18 DIAGNOSIS — M5441 Lumbago with sciatica, right side: Secondary | ICD-10-CM | POA: Diagnosis not present

## 2016-05-18 DIAGNOSIS — K7030 Alcoholic cirrhosis of liver without ascites: Secondary | ICD-10-CM

## 2016-05-18 MED ORDER — LACTULOSE 10 GM/15ML PO SOLN: 20.0000 g | Freq: Two times a day (BID) | ORAL | 5 refills | Status: DC

## 2016-05-18 MED ORDER — EZETIMIBE 10 MG PO TABS: 10.0000 mg | ORAL_TABLET | Freq: Every day | ORAL | 11 refills | Status: DC

## 2016-05-18 MED ORDER — EZETIMIBE 10 MG PO TABS: 10.0000 mg | ORAL_TABLET | Freq: Every day | ORAL | 1 refills | Status: DC

## 2016-05-18 NOTE — Addendum Note (Signed)
Addended by: Chevis Pretty on: 05/18/2016 03:48 PM   Modules accepted: Orders

## 2016-05-18 NOTE — Progress Notes (Addendum)
   Subjective:    Patient ID: LARSON LIMONES, male    DOB: August 25, 1965, 51 y.o.   MRN: [id number]  HPI Patient is hre today for leg pain- he was recently dismissed from pain clinic because he had alcohol in his system- he use to gt morphine rx by them. His leg pain is coming from his back and he wants referral to spine specialist in [place] to have something done about his back. He says that he was told he has a twisted disk in his back causing his pain. He also sees an ortho doctor and gets gel injections in his knee, but he cannot afford to go right now and wants [place] to give him vicoprofen for pain until he can see ortho next month.    Review of Systems  Constitutional: Negative.   HENT: Negative.   Respiratory: Negative.   Cardiovascular: Negative.   Genitourinary: Negative.   Musculoskeletal: Positive for arthralgias, back pain and joint swelling.  Neurological: Negative.   Psychiatric/Behavioral: Negative.   All other systems reviewed and are negative.      Objective:   Physical Exam  Constitutional: He is oriented to person, place, and time. He appears well-developed and well-nourished. No distress.  Cardiovascular: Normal rate and regular rhythm.   Pulmonary/Chest: Effort normal and breath sounds normal.  Musculoskeletal:  Gait slow and steady Rises well from sitting to standing  Neurological: He is alert and oriented to person, place, and time. He has normal reflexes.  Skin: Skin is warm.  Psychiatric: He has a normal mood and affect. His behavior is normal. Judgment and thought content normal.  Poor eye contact Repeating hisself stuttering   BP 104/69   Pulse 70   Temp 97 F (36.1 C) (Oral)   Ht 5' 9"  (1.753 m)   Wt (!) 330 lb (149.7 kg)   BMI 48.73 kg/m        Assessment & Plan:  1. Chronic midline low back pain with bilateral sciatica Rest No heavy lifting Moist heat - Ambulatory referral to Neurosurgery  2. Chronic pain of right knee patient requesting  vicoprofen- discussed pain policy Will need records from pain clinic Drug screen done today RTO prn  Mary-[name] Hassell Done, FNP  * Ordered a drug screen today- patient drank several bottles of water and stayed 2 hours but claimed that he was not able to void- told if could not leave urine then it is considered a positive drug screen and that he could not get pin medication from this office.

## 2016-05-21 ENCOUNTER — Telehealth: Payer: Self-pay

## 2016-05-21 NOTE — Telephone Encounter (Signed)
There is no need for referral to accupuncturinst.   He will have to wait for MRI results to get a referral to a spine center who typically require new MRI, 2016 MRI shows some disc buldging. MRI ordered by a partner last week.   [name], MD [facility] 05/21/2016, 11:26 AM

## 2016-05-21 NOTE — Telephone Encounter (Signed)
Pt notified that we will schedule MRI and neurosurgeon appt

## 2016-05-22 ENCOUNTER — Telehealth: Payer: Self-pay | Admitting: Family Medicine

## 2016-05-22 NOTE — Telephone Encounter (Signed)
Please review and advise.

## 2016-05-22 NOTE — Telephone Encounter (Signed)
Patient aware, informed him that we are in the process of scheduling him for an MRI and an appointment with a neurosurgeon.

## 2016-05-22 NOTE — Telephone Encounter (Signed)
I have previously discussed with the patient that because he has been discharged charged from a pain clinic he will not be low to receive pain medications at our clinic.  He can always come in for eval but for me prescribing opioids is not an option for him.   [name], MD [facility] 05/22/2016, 2:10 PM

## 2016-05-22 NOTE — Telephone Encounter (Signed)
We will not be rx him pain medication. I told him at visit if he could not leave urine that that was considered a positive drug screen and he would get no narcotics from this office. Also he is not my patient he is Dr. Edmon Crape patient and he will need to discuss firther with him.

## 2016-05-23 ENCOUNTER — Telehealth: Payer: Self-pay | Admitting: Family Medicine

## 2016-05-23 NOTE — Telephone Encounter (Signed)
Pt notified that according to Dr Alen Bleacher lab note from 06/2015, Androgel was d/ced informed pt that we can not RX medication without medication being medically necessary

## 2016-05-23 NOTE — Telephone Encounter (Signed)
Patient final got approval letter for androgel pump makes sure when orders you order 2 packs wants 1.5% sent in to [facility]. Patient wants to be called when it has been done.

## 2016-05-25 ENCOUNTER — Ambulatory Visit (INDEPENDENT_AMBULATORY_CARE_PROVIDER_SITE_OTHER): Payer: Medicare HMO | Admitting: Family Medicine

## 2016-05-25 ENCOUNTER — Encounter: Payer: Self-pay | Admitting: Family Medicine

## 2016-05-25 VITALS — BP 132/80 | HR 79 | Temp 97.0°F | Ht 69.0 in | Wt 322.2 lb

## 2016-05-25 DIAGNOSIS — K439 Ventral hernia without obstruction or gangrene: Secondary | ICD-10-CM

## 2016-05-25 DIAGNOSIS — M5441 Lumbago with sciatica, right side: Secondary | ICD-10-CM | POA: Diagnosis not present

## 2016-05-25 DIAGNOSIS — M5442 Lumbago with sciatica, left side: Secondary | ICD-10-CM | POA: Diagnosis not present

## 2016-05-25 DIAGNOSIS — M1A9XX0 Chronic gout, unspecified, without tophus (tophi): Secondary | ICD-10-CM

## 2016-05-25 DIAGNOSIS — G8929 Other chronic pain: Secondary | ICD-10-CM

## 2016-05-25 MED ORDER — METHYLPREDNISOLONE ACETATE 80 MG/ML IJ SUSP
80.0000 mg | Freq: Once | INTRAMUSCULAR | Status: AC
  Administered 2016-05-25: 80 mg via INTRAMUSCULAR

## 2016-05-25 NOTE — Progress Notes (Signed)
   HPI  Patient presents today to discuss pain.  Patient has history of bulging disc and lumbar back pain. He states that whenever he developed his ventral hernia he began having more pain with his back and anterior thighs. Patient was seen pain management, however was released due to alcohol use. He recently was offered urine drug screen in our office but left without producing or giving a urine sample, this was considered a positive drug screen.  Patient today states that his gout left ankle pain and swelling consistent with gout, he requests cortisone injection as well as Toradol.  She is very frustrated as he believes the ventral hernia is causing more leg pain. He understands that he is not a surgical candidate, however he would like me to allow him to proceed with surgery, specifically abdominal surgery anyway.  PMH: Smoking status noted ROS: Per HPI  Objective: BP 132/80   Pulse 79   Temp 97 F (36.1 C) (Oral)   Ht 5' 9"  (1.753 m)   Wt (!) 322 lb 3.2 oz (146.1 kg)   BMI 47.58 kg/m  Gen: NAD, alert, cooperative with exam HEENT: NCAT CV: RRR, good S1/S2, no murmur Resp: CTABL, no wheezes, non-labored Abd: Soft, nontender large ventral hernia approximately 15 cm x 10 cm surrounding the umbilicus Ext: No edema, warm Neuro: Alert and oriented, No gross deficits  Assessment and plan:  # Low back pain Low back pain with sciatica, history of bulging disc It's difficult to say if he could be a surgical candidate, however I would like to have cardiology and GI as opinion on that matter. I have referred him to orthopedic surgery, he could possibly benefit from epidural injections, however his platelet count may prohibit him from getting those as well.  # Ventral hernia No signs of incarceration It is a difficult situation as the patient understandably would like surgical repair, however he is a high risk surgical candidate  # Gout Likely acute flare in the left ankle, patient  requests Toradol plus cortisone injection, we have proceeded with only cortisone injection.   Very difficult situation, patient asked for pain medications on most visits. I have reminded him that because he was released from pain management we are unable to prescribe opiate pain medications for him. We also discussed that he did not produce a urine sample when another provider asked him to recently.    Orders Placed This Encounter  Procedures  . Ambulatory referral to Orthopedic Surgery    Referral Priority:   Routine    Referral Type:   Surgical    Referral Reason:   Specialty Services Required    Requested Specialty:   Orthopedic Surgery    Number of Visits Requested:   1    Meds ordered this encounter  Medications  . methylPREDNISolone acetate (DEPO-MEDROL) injection 80 mg    [name], MD [facility] 05/25/2016, 4:51 PM

## 2016-05-25 NOTE — Patient Instructions (Signed)
Great to see you!  Come back in 3 months unless you need me sooner.   We have referred you to an orthopedic doctor for your back.

## 2016-06-07 ENCOUNTER — Telehealth: Payer: Self-pay | Admitting: Family Medicine

## 2016-06-07 NOTE — Telephone Encounter (Signed)
Pt was watching TV and a commercial about depression came on and made him think he could be depressed. Pt advised he would ntbs to discuss this with Dr Wendi Snipes face to face. Appt scheduled with Dr Wendi Snipes 06/14/16 at 3:25.

## 2016-06-11 DIAGNOSIS — R69 Illness, unspecified: Secondary | ICD-10-CM | POA: Diagnosis not present

## 2016-06-11 DIAGNOSIS — M25562 Pain in left knee: Secondary | ICD-10-CM | POA: Diagnosis not present

## 2016-06-11 DIAGNOSIS — M1712 Unilateral primary osteoarthritis, left knee: Secondary | ICD-10-CM | POA: Diagnosis not present

## 2016-06-14 ENCOUNTER — Encounter: Payer: Self-pay | Admitting: Family Medicine

## 2016-06-14 ENCOUNTER — Ambulatory Visit (INDEPENDENT_AMBULATORY_CARE_PROVIDER_SITE_OTHER): Payer: Medicare HMO | Admitting: Family Medicine

## 2016-06-14 ENCOUNTER — Other Ambulatory Visit (INDEPENDENT_AMBULATORY_CARE_PROVIDER_SITE_OTHER): Payer: Self-pay | Admitting: Internal Medicine

## 2016-06-14 VITALS — BP 136/71 | HR 64 | Temp 97.6°F | Ht 69.0 in | Wt 332.0 lb

## 2016-06-14 DIAGNOSIS — F329 Major depressive disorder, single episode, unspecified: Secondary | ICD-10-CM

## 2016-06-14 DIAGNOSIS — F32A Depression, unspecified: Secondary | ICD-10-CM

## 2016-06-14 DIAGNOSIS — R69 Illness, unspecified: Secondary | ICD-10-CM | POA: Diagnosis not present

## 2016-06-14 DIAGNOSIS — D708 Other neutropenia: Secondary | ICD-10-CM

## 2016-06-14 DIAGNOSIS — E291 Testicular hypofunction: Secondary | ICD-10-CM

## 2016-06-14 DIAGNOSIS — R5382 Chronic fatigue, unspecified: Secondary | ICD-10-CM

## 2016-06-14 MED ORDER — BUPROPION HCL ER (XL) 150 MG PO TB24: 150.0000 mg | ORAL_TABLET | Freq: Every day | ORAL | 3 refills | Status: DC

## 2016-06-14 NOTE — Patient Instructions (Signed)
Great to see you!  I have started you on Wellbutrin 1 pill once daily  Come back in 3-4 weeks to follow up for depression and energy.

## 2016-06-14 NOTE — Progress Notes (Signed)
   HPI  Patient presents today fatigue and depression.  Patient states that the last 3-4 weeks she's had worsening fatigue. He also complains of depression.  After lengthy discussion he does not feel that he is depressed. However he does have anhedonia, he creased energy, increased somnolence, and difficulty getting to sleep. He denies suicidal thoughts.  Patient states that he previously was taking testosterone when he was tested for hypogonadism. He states that he's been out of testosterone for about 6 weeks, he felt much better when he used testosterone for 2 days when he recently found some old testosterone.  He is also wondering if a B-12 injection would help him.  PMH: Smoking status noted ROS: Per HPI  Objective: BP 136/71   Pulse 64   Temp 97.6 F (36.4 C) (Oral)   Ht 5' 9"  (1.753 m)   Wt (!) 332 lb (150.6 kg)   BMI 49.03 kg/m  Gen: NAD, alert, cooperative with exam HEENT: NCAT CV: RRR, good S1/S2, no murmur Resp: CTABL, no wheezes, non-labored Ext: No edema, warm Neuro: Alert and oriented, No gross deficits  Depression screen [facility] 2/9 06/14/2016 05/25/2016 05/18/2016 04/17/2016 02/03/2016  Decreased Interest 3 0 - 2 0  Down, Depressed, Hopeless 3 0 - 0 0  PHQ - 2 Score 6 0 - 2 0  Altered sleeping 3 - 0 1 -  Tired, decreased energy 3 - 0 2 -  Change in appetite 0 - - 0 -  Feeling bad or failure about yourself  0 - - 0 -  Trouble concentrating 0 - - 0 -  Moving slowly or fidgety/restless 2 - - 0 -  Suicidal thoughts 0 - - 0 -  PHQ-9 Score 14 - - 5 -  Difficult doing work/chores Not difficult at all - - - -  Some recent data might be hidden     Assessment and plan:  # Depression New diagnosis, significant findings on PHQ and patient is reporting several symptoms consistent with depression Start Wellbutrin Follow-up 3-4 weeks  # Fatigue Multifactorial most likely Patient with chronic anemia, cirrhosis, obesity, and benzodiazepine.  Also likely  depression Starting Wellbutrin  # Hypogonadism I have offered the patient an endocrinology referral to evaluate hypogonadism. I declined testing his testosterone today as he is recently use supplementation. I explained that he will need to refrain from using old prescriptions for long enough for [place] to be able to test and confirm that he has true hypogonadism. He declines my offer for referral today. Would recommend Dr. Dorris Fetch in [place], endocinology   Meds ordered this encounter  Medications  . buPROPion (WELLBUTRIN XL) 150 MG 24 hr tablet    Sig: Take 1 tablet (150 mg total) by mouth daily.    Dispense:  30 tablet    Refill:  [place], MD [facility] 06/14/2016, 5:36 PM

## 2016-06-16 ENCOUNTER — Telehealth: Payer: Self-pay | Admitting: Family Medicine

## 2016-06-17 NOTE — Progress Notes (Signed)
HPI The patient returns for follow up of diastolic heart failure and a history of atrial fibrillation.   He has been having back pain and Dr. Wendi Snipes wanted him to be seen for possible preop evaluation although no back surgery is planned.  He has been off and on warfarin in the past with some noncompliance and self medication. Also he was in the hospital in January at [facility]. I reviewed these records for this appointment. He had epistaxis and GI bleeding. He has supratherapeutic INR. He was taken off anticoagulation. He had no acute cardiac complaints with that. Now he comes back to the preoperative evaluation as above. He is limited by some knee pain. But he is able to push a lawnmower. He says he can do it once he's not getting any chest pressure, neck or arm discomfort. He's not having any new shortness of breath, PND or orthopnea. He rarely has palpitations and his only taken an extra diltiazem once since I increased his Cardizem dose to 300 mg. He's had no further GI bleeding. He has some mild lower extremity swelling.  Allergies  Allergen Reactions  . Oxycodone Base Itching and Nausea And Vomiting  . Prozac [Fluoxetine Hcl] Itching    Current Outpatient Prescriptions  Medication Sig Dispense Refill  . albuterol (PROAIR HFA) 108 (90 Base) MCG/ACT inhaler Inhale 2 puffs into the lungs every 6 (six) hours as needed for wheezing or shortness of breath. 8.5 g 4  . ALPRAZolam (XANAX) 0.5 MG tablet Take 1 mg by mouth 2 (two) times daily.     Marland Kitchen aspirin EC 81 MG tablet Take 81 mg by mouth every 6 (six) hours as needed (CHEST PAIN).    Marland Kitchen betamethasone dipropionate (DIPROLENE) 0.05 % cream APPLY TO AFFECTED AREA TWICE A DAY (Patient taking differently: as needed) 45 g 3  . buPROPion (WELLBUTRIN XL) 150 MG 24 hr tablet Take 1 tablet (150 mg total) by mouth daily. 30 tablet 3  . colchicine 0.6 MG tablet TAKE ONE TABLET TWICE DAILY AS NEEDED 60 tablet 5  . diltiazem (CARDIZEM SR) 90 MG 12 hr capsule  Take 1 capsule (90 mg total) by mouth daily as needed (palpitations). 30 capsule 11  . diltiazem (TIAZAC) 300 MG 24 hr capsule Take 1 Capsule by mouth once daily 30 capsule 0  . doxepin (SINEQUAN) 10 MG capsule Take 1 Capsule by mouth at bedtime as needed 30 capsule 2  . ezetimibe (ZETIA) 10 MG tablet Take 1 tablet (10 mg total) by mouth daily. 30 tablet 11  . fluticasone (FLONASE) 50 MCG/ACT nasal spray Place 2 sprays into both nostrils daily as needed. For allergies 16 g 5  . folic acid (FOLVITE) 468 MCG tablet Take 400 mcg by mouth daily.    Marland Kitchen gabapentin (NEURONTIN) 800 MG tablet Take 1 tablet (800 mg total) by mouth 3 (three) times daily. 90 tablet 3  . hydrocortisone 1 % ointment Apply 1 application topically 3 (three) times daily as needed. 30 g 1  . ipratropium (ATROVENT) 0.02 % nebulizer solution Take 2.5 mLs (500 mcg total) by nebulization 4 (four) times daily as needed. For wheezing 75 mL 3  . lactulose (CHRONULAC) 10 GM/15ML solution Take 30 mLs (20 g total) by mouth 2 (two) times daily. 1892 mL 5  . lidocaine (XYLOCAINE) 5 % ointment Apply 1 application topically 4 (four) times daily as needed. To knees and ankle as needed for pain 35.44 g 0  . LINZESS 145 MCG CAPS capsule Take 145  mcg by mouth daily before breakfast.     . Magnesium 400 MG CAPS Take 400 mg by mouth every morning. 30 capsule 3  . Menthol-Methyl Salicylate (MUSCLE RUB) 10-15 % CREA Apply 1 application topically as needed. For muscle pain    . metFORMIN (GLUCOPHAGE) 500 MG tablet TAKE ONE TABLET TWICE A DAY WITH FOOD 60 tablet 4  . metolazone (ZAROXOLYN) 2.5 MG tablet TAKE 1 TABLET DAILY 1 1/2 HOUR PRIOR TO TORSEMIDE FOR 3 DAYS AS NEEDED FOR SEVERE SWELLING 30 tablet 0  . milk thistle 175 MG tablet Take 175 mg by mouth 4 (four) times daily.     . Multiple Vitamins-Minerals (MULTI COMPLETE PO) Take 1 tablet by mouth daily.     . nitroGLYCERIN (NITROSTAT) 0.4 MG SL tablet Place 1 tablet (0.4 mg total) under the tongue every  5 (five) minutes as needed for chest pain (may repeat x3). 25 tablet 11  . pantoprazole (PROTONIX) 40 MG tablet Take 1 Tablet by mouth every morning 30 tablet 11  . phenylephrine (NASAL DECONGESTANT) 1 % nasal spray Place 1 drop into the nose every 4 (four) hours as needed. For allergies    . potassium chloride SA (K-DUR,KLOR-CON) 20 MEQ tablet Take 1 tablet (20 mEq total) by mouth 2 (two) times daily. 60 tablet 6  . propranolol (INDERAL) 20 MG tablet Take 1 tablet (20 mg total) by mouth 2 (two) times daily. 60 tablet 5  . torsemide (DEMADEX) 20 MG tablet 20 mg. Take one tablet by mouth in the morning and two tablets in the afternoon    . VOLTAREN 1 % GEL apply FOUR grams topically FOUR times daily 300 g 2  . glucose blood test strip 1 each by Other route as needed for other. Use as instructed    . NARCAN 4 MG/0.1ML LIQD     . neomycin-bacitracin-polymyxin (NEOSPORIN) ointment Apply 1 application topically every 12 (twelve) hours as needed. Reported on 06/01/2015    . Polysacchar Iron-FA-B12 (FERREX 150 FORTE) 150-1-25 MG-MG-MCG CAPS Take 1 capsule by mouth daily. 30 capsule 11  . torsemide (DEMADEX) 20 MG tablet TAKE ONE (1) TABLET THREE (3) TIMES EACH DAY 90 tablet 0   No current facility-administered medications for this visit.     Past Medical History:  Diagnosis Date  . Anxiety   . Atrial fibrillation ([place])   . CHF (congestive heart failure) ([place])   . Cirrhosis ([place])    NASH  . Constipation   . COPD (chronic obstructive pulmonary disease) ([place])   . Diabetes mellitus    type 2  . Genu varus   . GERD (gastroesophageal reflux disease)   . Gout   . Heart murmur   . Leukopenia 07/08/2015  . Neuromuscular disorder ([facility])    neuropathy in hands and feet  . Psoriasis   . RA (rheumatoid arthritis) ([place])   . Sleep apnea    cpap used- level 10 and greater  . Thrombocytopenia ([place])     Past Surgical History:  Procedure Laterality Date  . ANKLE SURGERY     right ankle talor repair    . CHOLECYSTECTOMY  sept 2016  . CHOLECYSTECTOMY    . COLONOSCOPY  05/08/2011   Procedure: COLONOSCOPY;  Surgeon: Rogene Houston, MD;  Location: [facility];  Service: Endoscopy;  Laterality: N/A;  730  . ESOPHAGOGASTRODUODENOSCOPY  02/28/2011   Procedure: ESOPHAGOGASTRODUODENOSCOPY (EGD);  Surgeon: Rogene Houston, MD;  Location: [facility];  Service: Endoscopy;  Laterality: N/A;  1200  . ESOPHAGOGASTRODUODENOSCOPY N/A 06/11/2012   Procedure: ESOPHAGOGASTRODUODENOSCOPY (EGD);  Surgeon: Rogene Houston, MD;  Location: [facility];  Service: Endoscopy;  Laterality: N/A;  1200  FYI patient is 400 pounds  . ESOPHAGOGASTRODUODENOSCOPY (EGD) WITH PROPOFOL N/A 04/21/2014   Procedure: ESOPHAGOGASTRODUODENOSCOPY (EGD) WITH PROPOFOL;  Surgeon: Rogene Houston, MD;  Location: [facility];  Service: Endoscopy;  Laterality: N/A;  . HERNIA REPAIR Right    as child; inguinal  . HERNIA REPAIR  sept 2016  . LIVER BIOPSY  2012  . SCIATIC NERVE EXPLORATION    . TONSILLECTOMY      ROS:  Knee pain, back pain.  Otherwise as stated in the HPI and negative for all other systems.    PHYSICAL EXAM BP 127/84   Pulse (!) 57   Ht 5' 11"  (1.803 m)   Wt (!) 338 lb (153.3 kg)   BMI 47.14 kg/m   GENERAL:  Well appearing, unkempt   NECK:  No jugular venous distention, waveform within normal limits, carotid upstroke brisk and symmetric, no bruits, no thyromegaly LUNGS:  Clear to auscultation bilaterally CHEST:  Unremarkable HEART:  PMI not displaced or sustained,S1 and S2 within normal limits, no S3, no S4, no clicks, no rubs, no murmurs ABD:  Flat, positive bowel sounds normal in frequency in pitch, no bruits, no rebound, no guarding, no midline pulsatile mass, no hepatomegaly, no splenomegaly, umbilical hernia large EXT:  2 plus pulses throughout, no edema, no cyanosis no clubbing SKIN:  Psoriasis, chronic venous stasis changes left greater than right leg, mild leg edema   EKG:  Sinus rhythm, rate 57, axis  within normal limits, intervals within normal limits.  No acute ST T wave changes.  06/20/2016   ASSESSMENT AND PLAN   ATRIAL FIBRILLATION - I agree that with his past history and GI bleed earlier this year he is not a candidate for anticoagulation.  He'll continue on the current dose of Cardizem.  OBESITY, MORBID -  He's gained some weight as he's been on a new antidepressant and this may be related. He can continue to work on this going forward.  DIASTOLIC HEART FAILURE, CHRONIC - He seems to be euvolemic. No change in therapy is indicated.  PORTAL VEIN THROMBUS - I reviewed the results of the abdominal ultrasound.  However, he's not an anticoagulation candidate.  SLEEP APNEA - He says he is a sleep evaluation of his insurance will approve it. For now he is using his current CPAP.  PREOP - He might have umbilical hernia repair or back surgery. He has a high functional level. He has no unstable symptoms or physical findings. Therefore, according to ACC/AHA guidelines the patient at acceptable risk for the planned procedure.

## 2016-06-18 ENCOUNTER — Telehealth: Payer: Self-pay | Admitting: Family Medicine

## 2016-06-18 DIAGNOSIS — G4733 Obstructive sleep apnea (adult) (pediatric): Secondary | ICD-10-CM

## 2016-06-18 NOTE — Telephone Encounter (Signed)
Order placed.   [name], MD [facility] 06/18/2016, 5:18 PM

## 2016-06-18 NOTE — Telephone Encounter (Signed)
Pt notified he will need recent sleep study He will call pulmonary to set up appt

## 2016-06-19 ENCOUNTER — Telehealth: Payer: Self-pay | Admitting: Family Medicine

## 2016-06-19 NOTE — Telephone Encounter (Signed)
Referral has been started and patient aware.

## 2016-06-20 ENCOUNTER — Encounter: Payer: Self-pay | Admitting: Cardiology

## 2016-06-20 ENCOUNTER — Ambulatory Visit (INDEPENDENT_AMBULATORY_CARE_PROVIDER_SITE_OTHER): Payer: Medicare HMO | Admitting: Cardiology

## 2016-06-20 VITALS — BP 127/84 | HR 57 | Ht 71.0 in | Wt 338.0 lb

## 2016-06-20 DIAGNOSIS — Z01810 Encounter for preprocedural cardiovascular examination: Secondary | ICD-10-CM | POA: Diagnosis not present

## 2016-06-20 DIAGNOSIS — Z1159 Encounter for screening for other viral diseases: Secondary | ICD-10-CM | POA: Insufficient documentation

## 2016-06-20 DIAGNOSIS — I480 Paroxysmal atrial fibrillation: Secondary | ICD-10-CM | POA: Diagnosis not present

## 2016-06-20 DIAGNOSIS — I81 Portal vein thrombosis: Secondary | ICD-10-CM | POA: Diagnosis not present

## 2016-06-20 DIAGNOSIS — I5032 Chronic diastolic (congestive) heart failure: Secondary | ICD-10-CM

## 2016-06-20 NOTE — Patient Instructions (Signed)
Medication Instructions:  The current medical regimen is effective;  continue present plan and medications.  Follow-Up: Follow up in 1 year with Dr. Percival Spanish in [place].  You will receive a letter in the mail 2 months before you are due.  Please call us when you receive this letter to schedule your follow up appointment.  If you need a refill on your cardiac medications before your next appointment, please call your pharmacy.  Thank you for choosing [facility]!!

## 2016-07-04 ENCOUNTER — Telehealth: Payer: Self-pay

## 2016-07-04 ENCOUNTER — Other Ambulatory Visit: Payer: Self-pay | Admitting: Urology

## 2016-07-04 NOTE — Telephone Encounter (Signed)
It looks like he has had a sleep study in 2009. This is within the 10 year window, so I believe a Cpap would be covered. Will ask our home health coordinator to help him with a new CPAP instead.   [name], MD [facility] 07/04/2016, 4:42 PM

## 2016-07-04 NOTE — Telephone Encounter (Signed)
You ordered a sleep study  Insurance needs a peer to peer review  YOu need to call [phone] option 4 option 2  # [id number]

## 2016-07-06 ENCOUNTER — Telehealth: Payer: Self-pay | Admitting: Family Medicine

## 2016-07-06 NOTE — Telephone Encounter (Signed)
Insurance questioning why pt is not taking a statin Pt is controlling with diet Pharmacy notified

## 2016-07-06 NOTE — Telephone Encounter (Deleted)
Notified pharmacy that

## 2016-07-09 NOTE — Telephone Encounter (Signed)
Left 3 different messages on 3 different days to return call

## 2016-07-09 NOTE — Patient Instructions (Signed)
[name]  07/09/2016     @PREFPERIOPPHARMACY @   Your procedure is scheduled on  07/23/2016   Report to [facility] at  5  A.M.  Call this number if you have problems the morning of surgery:  [phone]   Remember:  Do not eat food or drink liquids after midnight.  Take these medicines the morning of surgery with A SIP OF WATER  Xanax, wellbutrin, colchicine, cardiazem, diltiazem, gabapentin, protonix, propranolol. Use your inhaler and your nebulilzer before you come. DO NOT take any medications for diabetes the morning of your surgery.   Do not wear jewelry, make-up or nail polish.  Do not wear lotions, powders, or perfumes, or deoderant.  Do not shave 48 hours prior to surgery.  Men may shave face and neck.  Do not bring valuables to the hospital.  [facility] is not responsible for any belongings or valuables.  Contacts, dentures or bridgework may not be worn into surgery.  Leave your suitcase in the car.  After surgery it may be brought to your room.  For patients admitted to the hospital, discharge time will be determined by your treatment team.  Patients discharged the day of surgery will not be allowed to drive home.   Name and phone number of your driver:   family Special instructions:  None  Please read over the following fact sheets that you were given. Anesthesia Post-op Instructions and Care and Recovery After Surgery       Hydrocelectomy, Adult A hydrocelectomy is a surgical procedure to remove a collection of fluid (hydrocele) from the pouch that holds the testicles (scrotum). You may need to have a hydrocelectomy if a hydrocele is making your scrotum swell painfully. Tell a health care provider about:  Any allergies you have.  All medicines you are taking, including vitamins, herbs, eye drops, creams, and over-the-counter medicines.  Any problems you or family members have had with anesthetic medicines.  Any blood disorders you  have.  Any surgeries you have had.  Any medical conditions you have. What are the risks? Generally this is a safe procedure. However, problems may occur, including:  Bleeding into the scrotum (scrotal hematoma).  Damage to the testicle or the tube that carries sperm out of the testicle (vas deferens).  Infection.  Allergic reactions to medicines.  What happens before the procedure? Staying hydrated Follow instructions from your health care provider about hydration, which may include:  Up to 2 hours before the procedure - you may continue to drink clear liquids, such as water, clear fruit juice, black coffee, and plain tea.  Eating and drinking restrictions Follow instructions from your health care provider about eating and drinking, which may include:  8 hours before the procedure - stop eating heavy meals or foods such as meat, fried foods, or fatty foods.  6 hours before the procedure - stop eating light meals or foods, such as toast or cereal.  6 hours before the procedure - stop drinking milk or drinks that contain milk.  2 hours before the procedure - stop drinking clear liquids.  Medicines  Ask your health care provider about: ? Changing or stopping your regular medicines. This is especially important if you are taking diabetes medicines or blood thinners. ? Taking medicines such as aspirin and ibuprofen. These medicines can thin your blood. Do not take these medicines before your procedure if your health care provider instructs you not to.  You may be given antibiotic medicine to help prevent infection. General instructions  Plan to have someone take you home after the procedure. What happens during the procedure?  To reduce your risk of infection: ? Your health care team will wash or sanitize their hands. ? A germ-killing solution (antiseptic) will be used to wash your scrotum and the area around it. Hair may be removed from this area.  An IV tube will be  inserted into one of your veins.  You will be given one or more of the following: ? A medicine to make you relax (sedative). ? A medicine to make you fall asleep (general anesthetic).  A small incision will be made through the skin of your scrotum.  Your testicle and the hydrocele will be located, and the hydrocele sac will be opened with an incision.  The fluid will be drained from the hydrocele.  The hydrocele will be closed with absorbable stitches (sutures) to prevent fluid from building up again.  If you have a large hydrocele, a thin rubber drain may be placed to allow fluid to drain after the procedure.  The incision in your scrotum will be closed with absorbable sutures.  A bandage (dressing) will be placed over the incision. The procedure may vary among health care providers and hospitals. What happens after the procedure?  Your blood pressure, heart rate, breathing rate, and blood oxygen level will be monitored until the medicines you were given have worn off.  You will be given pain medicine as needed.  Do not drive for 24 hours if you were given a sedative.  You may have to wear an athletic support strap to hold the dressing in place and support your scrotum. This information is not intended to replace advice given to you by your health care provider. Make sure you discuss any questions you have with your health care provider. Document Released: 09/15/2014 Document Revised: 09/24/2015 Document Reviewed: 09/24/2015 Elsevier Interactive Patient Education  2018 [place], Adult, Care After This sheet gives you information about how to care for yourself after your procedure. Your health care provider may also give you more specific instructions. If you have problems or questions, contact your health care provider. What can I expect after the procedure? After your procedure, it is common to have mild discomfort, swelling, and bruising in the pouch that  holds your testicles (scrotum). Follow these instructions at home: Bathing  Ask your health care provider when you can shower, take baths, or go swimming.  If you were told to wear an athletic support strap, take it off when you shower or take a bath. Incision care  Follow instructions from your health care provider about how to take care of your incision. Make sure you: ? Wash your hands with soap and water before you change your bandage (dressing). If soap and water are not available, use hand sanitizer. ? Change your dressing as told by your health care provider. ? Leave stitches (sutures) in place.  Check your incision and scrotum every day for signs of infection. Check for: ? More redness, swelling, or pain. ? Blood or fluid. ? Warmth. ? Pus or a bad smell. Managing pain, stiffness, and swelling  If directed, apply ice to the injured area: ? Put ice in a plastic bag. ? Place a towel between your skin and the bag. ? Leave the ice on for 20 minutes, 2-3 times per day. Driving  Do not drive for 24 hours if you  were given a sedative.  Do not drive or use heavy machinery while taking prescription pain medicine.  Ask your health care provider when it is safe to drive. Activity  Do not do any activities that require great strength and energy (are vigorous) for as long as told by your health care provider.  Return to your normal activities as told by your health care provider. Ask your health care provider what activities are safe for you.  Do not lift anything that is heavier than 10 lb (4.5 kg) until your health care provider says that it is safe. General instructions  Take over-the-counter and prescription medicines only as told by your health care provider.  Keep all follow-up visits as told by your health care provider. This is important.  If you were given an athletic support strap, wear it as told by your health care provider.  If you had a drain put in during the  procedure, you will need to return for a follow-up visit to have it removed. Contact a health care provider if:  Your pain is not controlled with medicine.  You have more redness or swelling around your scrotum.  You have blood or fluid coming from your scrotum.  Your incision feels warm to the touch.  You have pus or a bad smell coming from your scrotum.  You have a fever. This information is not intended to replace advice given to you by your health care provider. Make sure you discuss any questions you have with your health care provider. Document Released: 09/15/2014 Document Revised: 09/24/2015 Document Reviewed: 09/24/2015 Elsevier Interactive Patient Education  2018 [place] Anesthesia, Adult General anesthesia is the use of medicines to make a person "go to sleep" (be unconscious) for a medical procedure. General anesthesia is often recommended when a procedure:  Is long.  Requires you to be still or in an unusual position.  Is major and can cause you to lose blood.  Is impossible to do without general anesthesia.  The medicines used for general anesthesia are called general anesthetics. In addition to making you sleep, the medicines:  Prevent pain.  Control your blood pressure.  Relax your muscles.  Tell a health care provider about:  Any allergies you have.  All medicines you are taking, including vitamins, herbs, eye drops, creams, and over-the-counter medicines.  Any problems you or family members have had with anesthetic medicines.  Types of anesthetics you have had in the past.  Any bleeding disorders you have.  Any surgeries you have had.  Any medical conditions you have.  Any history of heart or lung conditions, such as heart failure, sleep apnea, or chronic obstructive pulmonary disease (COPD).  Whether you are pregnant or may be pregnant.  Whether you use tobacco, alcohol, marijuana, or street drugs.  Any history of Production manager.  Any history of depression or anxiety. What are the risks? Generally, this is a safe procedure. However, problems may occur, including:  Allergic reaction to anesthetics.  Lung and heart problems.  Inhaling food or liquids from your stomach into your lungs (aspiration).  Injury to nerves.  Waking up during your procedure and being unable to move (rare).  Extreme agitation or a state of mental confusion (delirium) when you wake up from the anesthetic.  Air in the bloodstream, which can lead to stroke.  These problems are more likely to develop if you are having a major surgery or if you have an advanced medical condition. You can  prevent some of these complications by answering all of your health care provider's questions thoroughly and by following all pre-procedure instructions. General anesthesia can cause side effects, including:  Nausea or vomiting  A sore throat from the breathing tube.  Feeling cold or shivery.  Feeling tired, washed out, or achy.  Sleepiness or drowsiness.  Confusion or agitation.  What happens before the procedure? Staying hydrated Follow instructions from your health care provider about hydration, which may include:  Up to 2 hours before the procedure - you may continue to drink clear liquids, such as water, clear fruit juice, black coffee, and plain tea.  Eating and drinking restrictions Follow instructions from your health care provider about eating and drinking, which may include:  8 hours before the procedure - stop eating heavy meals or foods such as meat, fried foods, or fatty foods.  6 hours before the procedure - stop eating light meals or foods, such as toast or cereal.  6 hours before the procedure - stop drinking milk or drinks that contain milk.  2 hours before the procedure - stop drinking clear liquids.  Medicines  Ask your health care provider about: ? Changing or stopping your regular medicines. This is  especially important if you are taking diabetes medicines or blood thinners. ? Taking medicines such as aspirin and ibuprofen. These medicines can thin your blood. Do not take these medicines before your procedure if your health care provider instructs you not to. ? Taking new dietary supplements or medicines. Do not take these during the week before your procedure unless your health care provider approves them.  If you are told to take a medicine or to continue taking a medicine on the day of the procedure, take the medicine with sips of water. General instructions   Ask if you will be going home the same day, the following day, or after a longer hospital stay. ? Plan to have someone take you home. ? Plan to have someone stay with you for the first 24 hours after you leave the hospital or clinic.  For 3-6 weeks before the procedure, try not to use any tobacco products, such as cigarettes, chewing tobacco, and e-cigarettes.  You may brush your teeth on the morning of the procedure, but make sure to spit out the toothpaste. What happens during the procedure?  You will be given anesthetics through a mask and through an IV tube in one of your veins.  You may receive medicine to help you relax (sedative).  As soon as you are asleep, a breathing tube may be used to help you breathe.  An anesthesia specialist will stay with you throughout the procedure. He or she will help keep you comfortable and safe by continuing to give you medicines and adjusting the amount of medicine that you get. He or she will also watch your blood pressure, pulse, and oxygen levels to make sure that the anesthetics do not cause any problems.  If a breathing tube was used to help you breathe, it will be removed before you wake up. The procedure may vary among health care providers and hospitals. What happens after the procedure?  You will wake up, often slowly, after the procedure is complete, usually in a recovery  area.  Your blood pressure, heart rate, breathing rate, and blood oxygen level will be monitored until the medicines you were given have worn off.  You may be given medicine to help you calm down if you feel anxious or agitated.  If you will be going home the same day, your health care provider may check to make sure you can stand, drink, and urinate.  Your health care providers will treat your pain and side effects before you go home.  Do not drive for 24 hours if you received a sedative.  You may: ? Feel nauseous and vomit. ? Have a sore throat. ? Have mental slowness. ? Feel cold or shivery. ? Feel sleepy. ? Feel tired. ? Feel sore or achy, even in parts of your body where you did not have surgery. This information is not intended to replace advice given to you by your health care provider. Make sure you discuss any questions you have with your health care provider. Document Released: 04/03/2007 Document Revised: 06/07/2015 Document Reviewed: 12/09/2014 Elsevier Interactive Patient Education  2018 [place] Anesthesia, Adult, Care After These instructions provide you with information about caring for yourself after your procedure. Your health care provider may also give you more specific instructions. Your treatment has been planned according to current medical practices, but problems sometimes occur. Call your health care provider if you have any problems or questions after your procedure. What can I expect after the procedure? After the procedure, it is common to have:  Vomiting.  A sore throat.  Mental slowness.  It is common to feel:  Nauseous.  Cold or shivery.  Sleepy.  Tired.  Sore or achy, even in parts of your body where you did not have surgery.  Follow these instructions at home: For at least 24 hours after the procedure:  Do not: ? Participate in activities where you could fall or become injured. ? Drive. ? Use heavy machinery. ? Drink  alcohol. ? Take sleeping pills or medicines that cause drowsiness. ? Make important decisions or sign legal documents. ? Take care of children on your own.  Rest. Eating and drinking  If you vomit, drink water, juice, or soup when you can drink without vomiting.  Drink enough fluid to keep your urine clear or pale yellow.  Make sure you have little or no nausea before eating solid foods.  Follow the diet recommended by your health care provider. General instructions  Have a responsible adult stay with you until you are awake and alert.  Return to your normal activities as told by your health care provider. Ask your health care provider what activities are safe for you.  Take over-the-counter and prescription medicines only as told by your health care provider.  If you smoke, do not smoke without supervision.  Keep all follow-up visits as told by your health care provider. This is important. Contact a health care provider if:  You continue to have nausea or vomiting at home, and medicines are not helpful.  You cannot drink fluids or start eating again.  You cannot urinate after 8-12 hours.  You develop a skin rash.  You have fever.  You have increasing redness at the site of your procedure. Get help right away if:  You have difficulty breathing.  You have chest pain.  You have unexpected bleeding.  You feel that you are having a life-threatening or urgent problem. This information is not intended to replace advice given to you by your health care provider. Make sure you discuss any questions you have with your health care provider. Document Released: 04/02/2000 Document Revised: 05/30/2015 Document Reviewed: 12/09/2014 Elsevier Interactive Patient Education  [employer].

## 2016-07-10 ENCOUNTER — Other Ambulatory Visit: Payer: Self-pay | Admitting: Family Medicine

## 2016-07-16 ENCOUNTER — Other Ambulatory Visit: Payer: Self-pay | Admitting: Cardiology

## 2016-07-16 ENCOUNTER — Telehealth: Payer: Self-pay | Admitting: Family Medicine

## 2016-07-16 MED ORDER — MAGNESIUM 400 MG PO CAPS: 400.0000 mg | ORAL_CAPSULE | Freq: Every morning | ORAL | 3 refills | Status: DC

## 2016-07-16 NOTE — Telephone Encounter (Signed)
°*  STAT* If patient is at the pharmacy, call can be transferred to refill team.   1. Which medications need to be refilled? (please list name of each medication and dose if known)Diltiazem 300 mg and Diltiazem 90 mg  2. Which pharmacy/location (including street and city if local pharmacy) is medication to be sent to?[place] [phone]  3. Do they need a 30 day or 90 day supply?90 for the 300 mg and 30 for the 37m  and refills

## 2016-07-16 NOTE — Telephone Encounter (Signed)
Rx sent, was accidentally sent to drug store as well so will need to cancel whichever place he does nt want to get it.   [name], MD [facility] 07/16/2016, 12:45 PM

## 2016-07-16 NOTE — Addendum Note (Signed)
Addended by: Marin Olp on: 07/16/2016 01:02 PM   Modules accepted: Orders

## 2016-07-16 NOTE — Telephone Encounter (Signed)
What is the name of the medication? Magnesium   Have you contacted your pharmacy to request a refill? yes  Which pharmacy would you like this sent to? [facility]. He wants a call when this done please. He used to take this .   Patient notified that their request is being sent to the clinical staff for review and that they should receive a call once it is complete. If they do not receive a call within 24 hours they can check with their pharmacy or our office.

## 2016-07-16 NOTE — Telephone Encounter (Signed)
Pt notified of RX 

## 2016-07-17 MED ORDER — DILTIAZEM HCL ER 90 MG PO CP12: 90.0000 mg | ORAL_CAPSULE | Freq: Every day | ORAL | 5 refills | Status: DC | PRN

## 2016-07-17 NOTE — Telephone Encounter (Signed)
Sent rx for diltiazem 36m to preferred pharmacy.  Other rx has already been sent to pharmacy.

## 2016-07-18 ENCOUNTER — Encounter: Payer: Self-pay | Admitting: Family Medicine

## 2016-07-18 ENCOUNTER — Ambulatory Visit (INDEPENDENT_AMBULATORY_CARE_PROVIDER_SITE_OTHER): Payer: Medicare HMO | Admitting: Family Medicine

## 2016-07-18 VITALS — BP 118/81 | HR 71 | Temp 97.0°F | Ht 69.0 in | Wt 355.0 lb

## 2016-07-18 DIAGNOSIS — F339 Major depressive disorder, recurrent, unspecified: Secondary | ICD-10-CM | POA: Diagnosis not present

## 2016-07-18 DIAGNOSIS — R69 Illness, unspecified: Secondary | ICD-10-CM | POA: Diagnosis not present

## 2016-07-18 DIAGNOSIS — G479 Sleep disorder, unspecified: Secondary | ICD-10-CM

## 2016-07-18 DIAGNOSIS — E119 Type 2 diabetes mellitus without complications: Secondary | ICD-10-CM

## 2016-07-18 HISTORY — DX: Major depressive disorder, recurrent, unspecified: F33.9

## 2016-07-18 LAB — BAYER DCA HB A1C WAIVED: HB A1C: 5.1 % (ref <7.0)

## 2016-07-18 MED ORDER — DOXEPIN HCL 10 MG PO CAPS: 10.0000 mg | ORAL_CAPSULE | Freq: Every evening | ORAL | 2 refills | Status: DC | PRN

## 2016-07-18 NOTE — Progress Notes (Signed)
   HPI  Patient presents today here for follow-up chronic medical conditions.  Depression Tolerating Wellbutrin well, no SI. Patient states that he has much improved energy since starting Wellbutrin. He does complain of worsening difficulty sleeping. Requests refill of doxepin. States that doxepin plus and it does not seem to help his sleep as well as it did previously. He does not want to change Wellbutrin dosage or to another medication.  Diabetes Takes metformin as needed when her blood sugars above 140, rarely takes it, requests refill. States that he has hypoglycemia without any medications several times a week, eats candy bars to recover.    PMH: Smoking status noted ROS: Per HPI  Objective: BP 118/81   Pulse 71   Temp (!) 97 F (36.1 C) (Oral)   Ht 5' 9"  (1.753 m)   Wt (!) 355 lb (161 kg)   BMI 52.42 kg/m  Gen: NAD, alert, cooperative with exam HEENT: NCAT, EOMI, PERRL CV: RRR, good S1/S2, no murmur Resp: CTABL, no wheezes, non-labored Ext: No edema, warm Neuro: Alert and oriented, No gross deficits Diabetic Foot Exam - Simple   Simple Foot Form Diabetic Foot exam was performed with the following findings:  Yes 07/18/2016  9:04 AM  Visual Inspection No deformities, no ulcerations, no other skin breakdown bilaterally:  Yes Sensation Testing See comments:  Yes Pulse Check Posterior Tibialis and Dorsalis pulse intact bilaterally:  Yes Comments Session intact monofilament throughout except for left heel.      Assessment and plan:  # Type 2 diabetes Very well controlled Discontinue metformin. Recommended avoiding sugar sweetened beverages and response to feelings of hypoglycemia, recommend checking blood sugar to see if it's actually low. Without medication he should not be having serious hypoglycemia.  Does have diabetic neuropathy.   # Depression Energy improved on Wellbutrin, however now with worsening sleep changes. Continue doxepin which has been  used chronically Offered changing medication to another alternative or lowering dose which the patient does not want to do. Watchful waiting for now, perhaps his sleep will regulate after he missed his medication.  # Difficulty sleeping Long-term, worsened on Wellbutrin Continue doxepin Recommended stopping or changing Wellbutrin rather than increasing the dose of sleeping medicine     Orders Placed This Encounter  Procedures  . Bayer DCA Hb A1c Waived  . Microalbumin / creatinine urine ratio    Meds ordered this encounter  Medications  . doxepin (SINEQUAN) 10 MG capsule    Sig: Take 1 capsule (10 mg total) by mouth at bedtime as needed.    Dispense:  30 capsule    Refill:  [name], MD [facility] 07/18/2016, 9:06 AM

## 2016-07-19 ENCOUNTER — Ambulatory Visit: Payer: Medicare HMO | Admitting: Family Medicine

## 2016-07-19 ENCOUNTER — Encounter (HOSPITAL_COMMUNITY)
Admission: RE | Admit: 2016-07-19 | Discharge: 2016-07-19 | Disposition: A | Discharge status: Home / Self Care | Payer: Medicare HMO | Source: Ambulatory Visit | Attending: Urology | Admitting: Urology

## 2016-07-19 ENCOUNTER — Encounter (HOSPITAL_COMMUNITY): Payer: Self-pay

## 2016-07-19 DIAGNOSIS — N433 Hydrocele, unspecified: Secondary | ICD-10-CM | POA: Diagnosis present

## 2016-07-19 LAB — CBC
HEMATOCRIT: 35.5 % — AB (ref 39.0–52.0)
HEMOGLOBIN: 11.6 g/dL — AB (ref 13.0–17.0)
MCH: 26 pg (ref 26.0–34.0)
MCHC: 32.7 g/dL (ref 30.0–36.0)
MCV: 79.6 fL (ref 78.0–100.0)
Platelets: 56 10*3/uL — ABNORMAL LOW (ref 150–400)
RBC: 4.46 MIL/uL (ref 4.22–5.81)
RDW: 17.6 % — AB (ref 11.5–15.5)
WBC: 2.8 10*3/uL — ABNORMAL LOW (ref 4.0–10.5)

## 2016-07-19 LAB — MICROALBUMIN / CREATININE URINE RATIO
Creatinine, Urine: 6.2 mg/dL
Microalbumin, Urine: <3 ug/mL

## 2016-07-19 LAB — BASIC METABOLIC PANEL
Anion gap: 10 (ref 5–15)
BUN: 17 mg/dL (ref 6–20)
CHLORIDE: 106 mmol/L (ref 101–111)
CO2: 21 mmol/L — AB (ref 22–32)
CREATININE: 1.1 mg/dL (ref 0.61–1.24)
Calcium: 8.7 mg/dL — ABNORMAL LOW (ref 8.9–10.3)
GFR calc Af Amer: >60 mL/min (ref >60)
GFR calc non Af Amer: >60 mL/min (ref >60)
GLUCOSE: 104 mg/dL — AB (ref 65–99)
Potassium: 3.7 mmol/L (ref 3.5–5.1)
Sodium: 137 mmol/L (ref 135–145)

## 2016-07-20 ENCOUNTER — Telehealth: Payer: Self-pay | Admitting: Family Medicine

## 2016-07-20 ENCOUNTER — Telehealth: Payer: Self-pay

## 2016-07-20 NOTE — Telephone Encounter (Signed)
Please review and advise.

## 2016-07-20 NOTE — Telephone Encounter (Signed)
Pt has had cardiac pre-op with cardiology and they have cleared him.   From a medical standpoint he is high risk. His chronic portal thrombus is concerning. Form a cirrohisi standpoint his Meld score is good at 7.   I think he is ok to undergo the surgery based on functional capacity. We have discussed his high risk multiple times.   [name], MD [facility] 07/20/2016, 1:30 PM

## 2016-07-20 NOTE — Telephone Encounter (Signed)
Patient calling about Referral to back surgeon ( In May MRI denied and then we sent to Ortho which he was suppose to go on 06/30/16 Dr Tonita Cong)

## 2016-07-20 NOTE — Telephone Encounter (Signed)
Will ask them to let him know that he missed his appt to ortho on 6/23.  He may call for another appt if he'd like.   MRI was denied and neurosurg will only see with an MRI.   [name], MD [facility] 07/20/2016, 3:30 PM

## 2016-07-20 NOTE — Telephone Encounter (Signed)
Pt notified of recommendation

## 2016-07-21 NOTE — Telephone Encounter (Signed)
Patient wants you to do an appeal to try to obtain an MRI.  He said he was unaware of missing an orthopedic appointment.

## 2016-07-21 NOTE — Telephone Encounter (Signed)
I do not feel I have any additional clinical evidence to change my request for MRI.   Would recommend he re-schedules with Ortho.   [name], MD [facility] 07/21/2016, 9:53 AM

## 2016-07-23 ENCOUNTER — Encounter (HOSPITAL_COMMUNITY)
Admission: RE | Disposition: A | Discharge status: Home / Self Care | Payer: Self-pay | Source: Ambulatory Visit | Attending: Urology

## 2016-07-23 ENCOUNTER — Telehealth: Payer: Self-pay | Admitting: *Deleted

## 2016-07-23 ENCOUNTER — Ambulatory Visit (HOSPITAL_COMMUNITY)
Admission: RE | Admit: 2016-07-23 | Discharge: 2016-07-23 | Disposition: A | Discharge status: Home / Self Care | Payer: Medicare HMO | Source: Ambulatory Visit | Attending: Urology | Admitting: Urology

## 2016-07-23 ENCOUNTER — Telehealth: Payer: Self-pay | Admitting: Family Medicine

## 2016-07-23 DIAGNOSIS — Z539 Procedure and treatment not carried out, unspecified reason: Secondary | ICD-10-CM | POA: Diagnosis present

## 2016-07-23 DIAGNOSIS — G8929 Other chronic pain: Secondary | ICD-10-CM

## 2016-07-23 DIAGNOSIS — M545 Low back pain: Principal | ICD-10-CM

## 2016-07-23 SURGERY — HYDROCELECTOMY
Anesthesia: General | Laterality: Left

## 2016-07-23 NOTE — Progress Notes (Signed)
Dr Alyson Ingles in to talk with pt. Case canceled.

## 2016-07-23 NOTE — Progress Notes (Signed)
Pt left ambulatory.

## 2016-07-23 NOTE — Telephone Encounter (Signed)
Already has an open encounter for this

## 2016-07-23 NOTE — Progress Notes (Signed)
In pre-op. Uncooperative. States he has no one to take him home except [facility]. Has no one to sign him out or stay with him. Pt wants to use smokeless tobacco. Explained to pt this is not acceptable at this time. Upset he could not do this. Water given to pt to rinse his mouth. Dr Patsey Berthold and [facility]. Dr Alyson Ingles will be notified and talk with pt when he arrives.

## 2016-07-23 NOTE — Progress Notes (Signed)
Dr Alyson Ingles notified of pt situation. Will talk with pt.

## 2016-07-23 NOTE — Telephone Encounter (Signed)
Pt aware to call Dr Tonita Cong office for appt

## 2016-07-23 NOTE — Telephone Encounter (Signed)
Will [facility] in [place].   [name], MD [facility] 07/23/2016, 8:38 AM

## 2016-07-23 NOTE — Progress Notes (Signed)
Pt refuses to give urine sample for drug screen. States he is unable to void.

## 2016-08-08 ENCOUNTER — Ambulatory Visit: Payer: Medicare HMO | Admitting: Urology

## 2016-08-08 ENCOUNTER — Encounter (INDEPENDENT_AMBULATORY_CARE_PROVIDER_SITE_OTHER): Payer: Self-pay | Admitting: *Deleted

## 2016-08-14 ENCOUNTER — Telehealth: Payer: Self-pay | Admitting: Cardiology

## 2016-08-14 NOTE — Telephone Encounter (Signed)
New message    Pt is calling asking for a call back. He said he got stung by a bee and he's been having arrhythmia. He wants to know if he can take more medication.

## 2016-08-14 NOTE — Telephone Encounter (Signed)
He should only continue with his prescribed dose tomorrow and not take anymore today.

## 2016-08-14 NOTE — Telephone Encounter (Signed)
Pt called in about previous info.  I reviewed.  Explained if symptoms continue to go to ER and if they are present in AM to call the office.  He agreed.
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
## 2016-08-14 NOTE — Telephone Encounter (Signed)
Left detailed message on voicemail.  

## 2016-08-16 ENCOUNTER — Encounter (INDEPENDENT_AMBULATORY_CARE_PROVIDER_SITE_OTHER): Payer: Self-pay | Admitting: Radiology

## 2016-08-16 ENCOUNTER — Telehealth (INDEPENDENT_AMBULATORY_CARE_PROVIDER_SITE_OTHER): Payer: Self-pay | Admitting: Orthopaedic Surgery

## 2016-08-16 ENCOUNTER — Ambulatory Visit (INDEPENDENT_AMBULATORY_CARE_PROVIDER_SITE_OTHER): Payer: Medicare HMO | Admitting: Orthopaedic Surgery

## 2016-08-16 NOTE — Telephone Encounter (Signed)
I think patient is a new patient, we cannot say if we can do gel injection that day.  Also he is scheduled for back, not knee.  We cannot offer medical advice about a brace since he has never been seen by [place].  Will you please call him and tell him all this?  If I am wrong and he has been seen by Lorin Mercy previously let me know, but I don't see it.  I looked in [facility] as well too.  Thanks.

## 2016-08-16 NOTE — Telephone Encounter (Signed)
Patient called wanting to get the gel injection in his knee on the 6th of September at his next appointment with Dr. Lorin Mercy at the [facility] office, and also wanted to speak with you about a brace. CB # [phone]

## 2016-08-21 ENCOUNTER — Other Ambulatory Visit: Payer: Self-pay | Admitting: Family Medicine

## 2016-08-21 NOTE — Telephone Encounter (Signed)
Next appt 11/2016

## 2016-08-22 ENCOUNTER — Ambulatory Visit (INDEPENDENT_AMBULATORY_CARE_PROVIDER_SITE_OTHER): Payer: Medicare HMO | Admitting: Internal Medicine

## 2016-09-06 ENCOUNTER — Ambulatory Visit (INDEPENDENT_AMBULATORY_CARE_PROVIDER_SITE_OTHER): Payer: Medicare HMO | Admitting: Orthopaedic Surgery

## 2016-09-06 ENCOUNTER — Other Ambulatory Visit: Payer: Self-pay | Admitting: Family Medicine

## 2016-09-11 ENCOUNTER — Telehealth: Payer: Self-pay | Admitting: Family Medicine

## 2016-09-11 ENCOUNTER — Ambulatory Visit (INDEPENDENT_AMBULATORY_CARE_PROVIDER_SITE_OTHER): Payer: Medicare HMO | Admitting: Internal Medicine

## 2016-09-11 NOTE — Telephone Encounter (Signed)
Patient is states his anti depression meds is not working well now. He states the last 2-3 weeks he is not having energy and fatigue. Wanting to know if her can get a dosage adjustment. When he first got on it it worked great. Please advise.

## 2016-09-11 NOTE — Telephone Encounter (Signed)
LMTCB

## 2016-09-11 NOTE — Telephone Encounter (Signed)
lmtcb

## 2016-09-11 NOTE — Telephone Encounter (Signed)
Its probably best if we make adjustments when he is here given polypharmacy.   [name] Apple, MD [facility] 09/11/2016, 4:18 PM

## 2016-09-12 ENCOUNTER — Telehealth: Payer: Self-pay | Admitting: Family Medicine

## 2016-09-12 NOTE — Telephone Encounter (Signed)
His last cholesterol panel did not warrant it

## 2016-09-12 NOTE — Telephone Encounter (Signed)
Pt notified he will need to be seen Pt will call back to schedule

## 2016-09-12 NOTE — Telephone Encounter (Signed)
[facility] informed statin not warranted per Dr Wendi Snipes

## 2016-09-13 ENCOUNTER — Telehealth: Payer: Self-pay | Admitting: Family Medicine

## 2016-09-13 ENCOUNTER — Ambulatory Visit (INDEPENDENT_AMBULATORY_CARE_PROVIDER_SITE_OTHER): Payer: Medicare HMO | Admitting: Orthopaedic Surgery

## 2016-09-13 NOTE — Telephone Encounter (Signed)
Melatonin is a safe thing to try for sleep  We cannot give the viscosuplementation injections here.   [name], MD [facility] 09/13/2016, 2:52 PM

## 2016-09-13 NOTE — Telephone Encounter (Signed)
Patient aware and verbalizes understanding. 

## 2016-09-13 NOTE — Telephone Encounter (Signed)
Patient can not sleep he has tried all kinds of meds ( Ambien and some otc meds.  He is so tired and just can not go to sleep. Tosses and turns all night X 1 week. What can he do? 3 mths ago he had steroid inj in left knee. He is wondering if we can get the gel injection for his knee like the ortho does, because it is cheaper for him to come here.

## 2016-09-18 ENCOUNTER — Other Ambulatory Visit (HOSPITAL_COMMUNITY): Payer: Medicare HMO

## 2016-09-18 ENCOUNTER — Telehealth (INDEPENDENT_AMBULATORY_CARE_PROVIDER_SITE_OTHER): Payer: Self-pay | Admitting: Internal Medicine

## 2016-09-18 ENCOUNTER — Encounter (HOSPITAL_COMMUNITY): Payer: Medicare HMO | Attending: Oncology

## 2016-09-18 NOTE — Telephone Encounter (Signed)
Patient called, would like to know if he can have clearance for hernia surgery.  He would like you to call Dr. Florene Glen at [facility] to let him know he can have the surgery.  He doesn't have the number, he stated to call general surgery.  [phone]

## 2016-09-18 NOTE — Telephone Encounter (Signed)
I advised him that I am not going to give medical clearance. He has low platelets, cirrhosis, and leukopenia.

## 2016-10-11 ENCOUNTER — Ambulatory Visit (INDEPENDENT_AMBULATORY_CARE_PROVIDER_SITE_OTHER): Payer: Medicare HMO | Admitting: Internal Medicine

## 2016-10-11 ENCOUNTER — Ambulatory Visit (INDEPENDENT_AMBULATORY_CARE_PROVIDER_SITE_OTHER): Payer: Medicare HMO | Admitting: Orthopaedic Surgery

## 2016-10-15 ENCOUNTER — Other Ambulatory Visit: Payer: Self-pay | Admitting: Family Medicine

## 2016-10-18 ENCOUNTER — Ambulatory Visit (INDEPENDENT_AMBULATORY_CARE_PROVIDER_SITE_OTHER): Payer: Medicare HMO | Admitting: Orthopaedic Surgery

## 2016-10-23 ENCOUNTER — Other Ambulatory Visit: Payer: Self-pay | Admitting: Family Medicine

## 2016-10-24 ENCOUNTER — Other Ambulatory Visit: Payer: Self-pay | Admitting: Family Medicine

## 2016-11-12 ENCOUNTER — Ambulatory Visit (INDEPENDENT_AMBULATORY_CARE_PROVIDER_SITE_OTHER): Payer: Medicare HMO | Admitting: Internal Medicine

## 2016-11-13 ENCOUNTER — Other Ambulatory Visit: Payer: Self-pay | Admitting: Family Medicine

## 2016-11-15 ENCOUNTER — Telehealth: Payer: Self-pay

## 2016-11-15 NOTE — Telephone Encounter (Signed)
Patient is requesting a refill of indomethacin 57m. Please advise

## 2016-11-16 ENCOUNTER — Telehealth: Payer: Self-pay | Admitting: Family Medicine

## 2016-11-16 ENCOUNTER — Other Ambulatory Visit: Payer: Self-pay | Admitting: Family Medicine

## 2016-11-16 NOTE — Telephone Encounter (Signed)
I have recommended that he stop NSAIDs given his comorbidities and risk of developing ulcers.   [name], MD [facility] 11/16/2016, 4:15 PM

## 2016-11-16 NOTE — Telephone Encounter (Signed)
What is the name of the medication? Indomethacin 50 mg  Have you contacted your pharmacy to request a refill?YES  Which pharmacy would you like this sent to? [facility]   Patient notified that their request is being sent to the clinical staff for review and that they should receive a call once it is complete. If they do not receive a call within 24 hours they can check with their pharmacy or our office.

## 2016-11-16 NOTE — Telephone Encounter (Signed)
Changing pharmacy's Indomethacin not on current med list, last filled 07/10/16

## 2016-11-16 NOTE — Telephone Encounter (Signed)
See one note from today.   [name], MD [facility] 11/16/2016, 4:16 PM

## 2016-11-16 NOTE — Telephone Encounter (Signed)
Pt states he has been taking this for years - he states he needs something.   He gets #90 of the indomethacin - but only takes PRN  He does take the colchicine daily   He cant take IBU (nsaid) He cant take tylenol (due to liver - was told years ago)  Last time he "couldn't take anything" - he ended up in the hosp and on steroids and his sugar in return went way up  What can he do?

## 2016-11-16 NOTE — Telephone Encounter (Signed)
Pt needs to be seen. Ok to take colchicine twice daily, however he is already doing this I believe.   We have tried ppx treatment before and compliance has always been an issue.   Due to presence of esophageal varices (and hypertensive gastropathy needing a clip in the gastric antrum in Jan 2018)  I would not prescribe NSAIDs.    [name], MD [facility] 11/16/2016, 4:45 PM

## 2016-11-16 NOTE — Addendum Note (Signed)
Addended by: Timmothy Euler on: 11/16/2016 11:29 AM   Modules accepted: Orders

## 2016-11-16 NOTE — Telephone Encounter (Signed)
Pharm changed in the system

## 2016-11-19 ENCOUNTER — Ambulatory Visit: Payer: Medicare HMO | Admitting: Family Medicine

## 2016-11-25 DIAGNOSIS — J449 Chronic obstructive pulmonary disease, unspecified: Secondary | ICD-10-CM | POA: Diagnosis not present

## 2016-11-25 DIAGNOSIS — K219 Gastro-esophageal reflux disease without esophagitis: Secondary | ICD-10-CM | POA: Diagnosis not present

## 2016-11-25 DIAGNOSIS — M25462 Effusion, left knee: Secondary | ICD-10-CM | POA: Diagnosis not present

## 2016-11-25 DIAGNOSIS — M1712 Unilateral primary osteoarthritis, left knee: Secondary | ICD-10-CM | POA: Diagnosis not present

## 2016-11-25 DIAGNOSIS — Z79899 Other long term (current) drug therapy: Secondary | ICD-10-CM | POA: Diagnosis not present

## 2016-11-25 DIAGNOSIS — I5032 Chronic diastolic (congestive) heart failure: Secondary | ICD-10-CM | POA: Diagnosis not present

## 2016-11-25 DIAGNOSIS — K7460 Unspecified cirrhosis of liver: Secondary | ICD-10-CM | POA: Diagnosis not present

## 2016-11-25 DIAGNOSIS — K0889 Other specified disorders of teeth and supporting structures: Secondary | ICD-10-CM | POA: Diagnosis not present

## 2016-11-25 DIAGNOSIS — E119 Type 2 diabetes mellitus without complications: Secondary | ICD-10-CM | POA: Diagnosis not present

## 2016-12-10 ENCOUNTER — Ambulatory Visit (INDEPENDENT_AMBULATORY_CARE_PROVIDER_SITE_OTHER): Payer: Medicare HMO | Admitting: Internal Medicine

## 2016-12-11 ENCOUNTER — Encounter (INDEPENDENT_AMBULATORY_CARE_PROVIDER_SITE_OTHER): Payer: Self-pay | Admitting: Internal Medicine

## 2016-12-14 ENCOUNTER — Other Ambulatory Visit: Payer: Self-pay | Admitting: Family Medicine

## 2016-12-14 NOTE — Telephone Encounter (Signed)
:  ast seen 07/2016

## 2016-12-17 ENCOUNTER — Other Ambulatory Visit: Payer: Self-pay | Admitting: Family Medicine

## 2016-12-19 ENCOUNTER — Telehealth: Payer: Self-pay | Admitting: Family Medicine

## 2016-12-19 DIAGNOSIS — K7030 Alcoholic cirrhosis of liver without ascites: Secondary | ICD-10-CM

## 2016-12-19 NOTE — Telephone Encounter (Signed)
Patient states that Dr. Donavan Burnet would like [place] to start filling patients lactulose- please advise and send if approved to [facility].

## 2016-12-19 NOTE — Telephone Encounter (Signed)
Pt liver doctor wants for [facility] to start Rx his a med they started him on (constulose) Per PT please advise. [facility]

## 2016-12-20 ENCOUNTER — Ambulatory Visit: Payer: Medicare HMO | Admitting: Family Medicine

## 2016-12-20 ENCOUNTER — Telehealth (INDEPENDENT_AMBULATORY_CARE_PROVIDER_SITE_OTHER): Payer: Self-pay | Admitting: Internal Medicine

## 2016-12-20 DIAGNOSIS — K7030 Alcoholic cirrhosis of liver without ascites: Secondary | ICD-10-CM

## 2016-12-20 MED ORDER — LACTULOSE 10 GM/15ML PO SOLN: 20.0000 g | Freq: Two times a day (BID) | ORAL | 5 refills | Status: DC

## 2016-12-20 MED ORDER — LACTULOSE 10 GM/15ML PO SOLN: 20.0000 g | Freq: Two times a day (BID) | ORAL | 1 refills | Status: DC

## 2016-12-20 NOTE — Telephone Encounter (Signed)
Patient aware.

## 2016-12-20 NOTE — Telephone Encounter (Signed)
Rx sent to his pharmacy

## 2016-12-20 NOTE — Telephone Encounter (Signed)
Rx sent  [name], MD [facility] 12/20/2016, 8:03 AM

## 2016-12-29 DIAGNOSIS — E1140 Type 2 diabetes mellitus with diabetic neuropathy, unspecified: Secondary | ICD-10-CM | POA: Diagnosis not present

## 2016-12-29 DIAGNOSIS — Z79899 Other long term (current) drug therapy: Secondary | ICD-10-CM | POA: Diagnosis not present

## 2016-12-29 DIAGNOSIS — R69 Illness, unspecified: Secondary | ICD-10-CM | POA: Diagnosis not present

## 2016-12-29 DIAGNOSIS — M25561 Pain in right knee: Secondary | ICD-10-CM | POA: Diagnosis not present

## 2016-12-29 DIAGNOSIS — M109 Gout, unspecified: Secondary | ICD-10-CM | POA: Diagnosis not present

## 2016-12-29 DIAGNOSIS — I110 Hypertensive heart disease with heart failure: Secondary | ICD-10-CM | POA: Diagnosis not present

## 2016-12-29 DIAGNOSIS — J449 Chronic obstructive pulmonary disease, unspecified: Secondary | ICD-10-CM | POA: Diagnosis not present

## 2016-12-29 DIAGNOSIS — K219 Gastro-esophageal reflux disease without esophagitis: Secondary | ICD-10-CM | POA: Diagnosis not present

## 2016-12-29 DIAGNOSIS — I5032 Chronic diastolic (congestive) heart failure: Secondary | ICD-10-CM | POA: Diagnosis not present

## 2017-01-10 ENCOUNTER — Ambulatory Visit (INDEPENDENT_AMBULATORY_CARE_PROVIDER_SITE_OTHER): Payer: Medicare HMO

## 2017-01-10 ENCOUNTER — Ambulatory Visit (INDEPENDENT_AMBULATORY_CARE_PROVIDER_SITE_OTHER): Payer: Medicare HMO | Admitting: Orthopaedic Surgery

## 2017-01-10 ENCOUNTER — Encounter (INDEPENDENT_AMBULATORY_CARE_PROVIDER_SITE_OTHER): Payer: Self-pay | Admitting: Orthopaedic Surgery

## 2017-01-10 VITALS — BP 153/96 | HR 92 | Ht 71.0 in | Wt 339.0 lb

## 2017-01-10 DIAGNOSIS — G8929 Other chronic pain: Secondary | ICD-10-CM

## 2017-01-10 DIAGNOSIS — M25562 Pain in left knee: Secondary | ICD-10-CM

## 2017-01-10 DIAGNOSIS — M25571 Pain in right ankle and joints of right foot: Secondary | ICD-10-CM | POA: Diagnosis not present

## 2017-01-10 DIAGNOSIS — M1A9XX0 Chronic gout, unspecified, without tophus (tophi): Secondary | ICD-10-CM | POA: Diagnosis not present

## 2017-01-10 NOTE — Progress Notes (Addendum)
Office Visit Note   Patient: [name]           Date of Birth: 1965-02-07           MRN: [id number] Visit Date: 01/10/2017              Requested by: Timmothy Euler, MD [place], [place] [address] PCP: Timmothy Euler, MD   Assessment & Plan: Visit Diagnoses:  1. Pain in right ankle and joints of right foot   2. Chronic pain of left knee   3. Chronic gout without tophus, unspecified cause, unspecified site     Plan: His renal function is normal.  Prescription given for some prednisone 10 mg tablets #20.  We will start some allopurinol 100 mg 1 p.o. daily for 2 weeks and then go to 2 tablets a day.  In the past he has been started on a higher dose and always has a flare of gout associated with a higher dose of allopurinol.  I plan to check him back in a month and the goal is to gradually get him under control and get his uric acid back down to normal and help take care of the tophi that he has and joint pain related to uncontrolled gout.  Follow-Up Instructions: Return in about 1 month (around 02/10/2017).   Orders:  Orders Placed This Encounter  Procedures  . XR Ankle Complete Right  . XR KNEE 3 VIEW LEFT   No orders of the defined types were placed in this encounter.     Procedures: No procedures performed   Clinical Data: No additional findings.   Subjective: Chief Complaint  Patient presents with  . Left Knee - Pain  . Right Ankle - Pain    HPI 52 year old male seen with problems with right subtalar posttraumatic arthritis as well as left knee osteoarthritis.  He has had pain off and on.  He has had some problems with his fingers has some gouty tophi noted on the left MCP joint and also right fifth finger and also right third MCP joint.  He states he not able to exercise although he has lost 100 pounds with dieting.  He has had some problems with diabetes in the past states after he lost 100 pounds his sugars been better.  Review of Systems  was for extensive medical problems including morbid obesity.  Gout with hyperuricemia with last epic level 11.8.  Positive for asthma previous gallbladder removal portal vein thrombosis, umbilical hernia after his gallbladder removal, thrombocytopenia, pulmonary hypertension, diastolic heart failure, leg edema, cirrhosis, psoriasis otherwise negative as it pertains HPI.   Objective: Vital Signs: BP (!) 153/96   Pulse 92   Ht 5' 11"  (1.803 m)   Wt (!) 339 lb (153.8 kg)   BMI 47.28 kg/m   Physical Exam  Constitutional: He is oriented to person, place, and time. He appears well-developed and well-nourished.  HENT:  Head: Normocephalic.  Eyes: Pupils are equal, round, and reactive to light.  Neck:  She has some decreased cervical range of motion with discomfort.  Cardiovascular: Normal rate.  Pulmonary/Chest: Effort normal.  Neurological: He is alert and oriented to person, place, and time.  Skin: Skin is warm.  Gouty tophus noted in his hands and MCP joint third and right fifth finger PIP joint.  Psoriasis over his elbows.    Ortho Exam bilaterally.  Crepitus with knee range of motion varus deformity of left knee 15-20 degrees.  Tophi  noted MCP joints long finger right and left.  Also PIP joint fifth finger.  He has limited subtalar motion on the right ankle with pain.  Specialty Comments:  No specialty comments available.  Imaging: Xr Ankle Complete Right  Result Date: 01/10/2017 Three-view x-rays right ankle obtained and reviewed.  This shows subtalar arthritis posttraumatic changes.  Tibiotalar joint shows minimal changes. Impression: Posttraumatic subtalar arthritis.  Xr Knee 3 View Left  Result Date: 01/10/2017 Review x-rays left knee obtained.  This shows varus deformity with severe bone-on-bone medial compartment with erosive changes.  Lateral marginal osteophytes.  This may represent a combination of inflammatory arthritis with his gout history as well as osteoarthritis.  Impression: Knee osteoarthritis with varus deformity and medial compartment erosion.    PMFS History: Patient Active Problem List   Diagnosis Date Noted  . Depression, recurrent ([place]) 07/18/2016  . Preop cardiovascular exam 06/20/2016  . HLD (hyperlipidemia) 10/31/2015  . Leukopenia 07/08/2015  . Thrombocytopenia ([place]) 07/08/2015  . Portal vein thrombosis 06/08/2015  . Back pain 02/19/2013  . Neck pain 02/19/2013  . Genu varus   . Hypogonadism male 05/05/2012  . Umbilical hernia, reducible 12/26/2011  . Asthma 08/28/2011  . Cirrhosis ([place]) 08/14/2011  . Rectal bleeding 04/25/2011  . UNSPECIFIED TACHYCARDIA 02/01/2010  . LEG PAIN 02/28/2009  . T2DM (type 2 diabetes mellitus) ([place]) 05/22/2008  . Gout 05/22/2008  . HYPERTENSION, PULMONARY 05/22/2008  . Atrial fibrillation ([place]) 05/22/2008  . DIASTOLIC HEART FAILURE, CHRONIC 05/22/2008  . OBESITY, MORBID 02/21/2007  . CAD 02/21/2007  . OSA (obstructive sleep apnea) 02/04/2007   Past Medical History:  Diagnosis Date  . Anxiety   . Atrial fibrillation ([place])   . CHF (congestive heart failure) ([place])   . Cirrhosis ([place])    NASH  . Constipation   . COPD (chronic obstructive pulmonary disease) ([place])   . Depression, recurrent ([place]) 07/18/2016  . Diabetes mellitus    type 2  . Genu varus   . GERD (gastroesophageal reflux disease)   . Gout   . Heart murmur   . Leukopenia 07/08/2015  . Neuromuscular disorder ([facility])    neuropathy in hands and feet  . Psoriasis   . RA (rheumatoid arthritis) ([place])   . Sleep apnea    cpap used- level 10 and greater  . Thrombocytopenia ([place])     Family History  Problem Relation Age of Onset  . Colon cancer Mother   . Cancer Mother        intestine  . Cancer Father        throat  . Cancer Brother        brain  . Cancer Sister   . Heart attack Neg Hx   . Stroke Neg Hx     Past Surgical History:  Procedure Laterality Date  . ANKLE SURGERY     right ankle talor repair  . CHOLECYSTECTOMY   sept 2016  . CHOLECYSTECTOMY    . COLONOSCOPY  05/08/2011   Procedure: COLONOSCOPY;  Surgeon: Rogene Houston, MD;  Location: [facility];  Service: Endoscopy;  Laterality: N/A;  730  . ESOPHAGOGASTRODUODENOSCOPY  02/28/2011   Procedure: ESOPHAGOGASTRODUODENOSCOPY (EGD);  Surgeon: Rogene Houston, MD;  Location: [facility];  Service: Endoscopy;  Laterality: N/A;  1200  . ESOPHAGOGASTRODUODENOSCOPY N/A 06/11/2012   Procedure: ESOPHAGOGASTRODUODENOSCOPY (EGD);  Surgeon: Rogene Houston, MD;  Location: [facility];  Service: Endoscopy;  Laterality: N/A;  1200  FYI patient is 400 pounds  . ESOPHAGOGASTRODUODENOSCOPY (  EGD) WITH PROPOFOL N/A 04/21/2014   Procedure: ESOPHAGOGASTRODUODENOSCOPY (EGD) WITH PROPOFOL;  Surgeon: Rogene Houston, MD;  Location: [facility] ORS;  Service: Endoscopy;  Laterality: N/A;  . HERNIA REPAIR Right    as child; inguinal  . HERNIA REPAIR  sept 2016  . LIVER BIOPSY  2012  . SCIATIC NERVE EXPLORATION    . TONSILLECTOMY     Social History   Occupational History  . Not on file  Tobacco Use  . Smoking status: Never Smoker  . Smokeless tobacco: Current User    Types: Snuff, Chew  . Tobacco comment: Pt reports that he "dips"  Substance and Sexual Activity  . Alcohol use: No    Alcohol/week: 0.6 oz    Types: 1 Cans of beer per week    Comment: stopped drinking 2-3 yrs ago.   . Drug use: No    Comment: Use to smoke cocaine and marijuana. No IV drug use  . Sexual activity: Yes

## 2017-01-14 ENCOUNTER — Other Ambulatory Visit (INDEPENDENT_AMBULATORY_CARE_PROVIDER_SITE_OTHER): Payer: Self-pay | Admitting: Internal Medicine

## 2017-01-16 ENCOUNTER — Telehealth (INDEPENDENT_AMBULATORY_CARE_PROVIDER_SITE_OTHER): Payer: Self-pay | Admitting: Orthopaedic Surgery

## 2017-01-16 NOTE — Telephone Encounter (Signed)
Patient states he's starting to get gout in his knee,ankles and wrist. Pt wanted to know if he go to the [place] office to get a brace for his ankle, knee & wrist and something for pain.

## 2017-01-16 NOTE — Telephone Encounter (Signed)
Please advise. Patient seen in office 01/10/17.

## 2017-01-17 ENCOUNTER — Ambulatory Visit: Payer: Medicare HMO | Admitting: Family Medicine

## 2017-01-17 NOTE — Telephone Encounter (Signed)
I called done. He had someone deliver at his door and had to go and did not discuss it further. Attempted to call him back but will not ring thru

## 2017-01-24 ENCOUNTER — Other Ambulatory Visit: Payer: Self-pay | Admitting: *Deleted

## 2017-01-24 MED ORDER — COLCHICINE 0.6 MG PO CAPS: ORAL_CAPSULE | ORAL | 0 refills | Status: DC

## 2017-01-24 NOTE — Telephone Encounter (Signed)
Next OV 11/15/17 

## 2017-01-25 ENCOUNTER — Ambulatory Visit (INDEPENDENT_AMBULATORY_CARE_PROVIDER_SITE_OTHER): Payer: Medicare HMO | Admitting: Family Medicine

## 2017-01-25 ENCOUNTER — Encounter: Payer: Self-pay | Admitting: Family Medicine

## 2017-01-25 ENCOUNTER — Telehealth: Payer: Self-pay | Admitting: Family Medicine

## 2017-01-25 VITALS — BP 147/82 | HR 85 | Temp 96.6°F | Ht 71.0 in | Wt 364.4 lb

## 2017-01-25 DIAGNOSIS — M1A9XX0 Chronic gout, unspecified, without tophus (tophi): Secondary | ICD-10-CM

## 2017-01-25 DIAGNOSIS — E119 Type 2 diabetes mellitus without complications: Secondary | ICD-10-CM | POA: Diagnosis not present

## 2017-01-25 DIAGNOSIS — R69 Illness, unspecified: Secondary | ICD-10-CM | POA: Diagnosis not present

## 2017-01-25 DIAGNOSIS — I81 Portal vein thrombosis: Secondary | ICD-10-CM

## 2017-01-25 DIAGNOSIS — K7460 Unspecified cirrhosis of liver: Secondary | ICD-10-CM

## 2017-01-25 DIAGNOSIS — Z7251 High risk heterosexual behavior: Secondary | ICD-10-CM | POA: Diagnosis not present

## 2017-01-25 LAB — BAYER DCA HB A1C WAIVED: HB A1C: 5.9 % (ref <7.0)

## 2017-01-25 MED ORDER — PROPRANOLOL HCL 20 MG PO TABS: 20.0000 mg | ORAL_TABLET | Freq: Every day | ORAL | 3 refills | Status: DC

## 2017-01-25 MED ORDER — BUPROPION HCL ER (XL) 150 MG PO TB24: 150.0000 mg | ORAL_TABLET | Freq: Every day | ORAL | 3 refills | Status: DC

## 2017-01-25 MED ORDER — METHYLPREDNISOLONE ACETATE 80 MG/ML IJ SUSP
80.0000 mg | Freq: Once | INTRAMUSCULAR | Status: AC
  Administered 2017-01-25: 80 mg via INTRAMUSCULAR

## 2017-01-25 MED ORDER — EZETIMIBE 10 MG PO TABS: 10.0000 mg | ORAL_TABLET | Freq: Every day | ORAL | 3 refills | Status: DC

## 2017-01-25 MED ORDER — COLCHICINE 0.6 MG PO CAPS: ORAL_CAPSULE | ORAL | 1 refills | Status: DC

## 2017-01-25 MED ORDER — GLUCOSE BLOOD VI STRP: 1.0000 | ORAL_STRIP | 4 refills | Status: DC | PRN

## 2017-01-25 NOTE — Patient Instructions (Addendum)
Great to see you!  Let's plan to follow-up in 3 months unless you need [place] sooner.  PLease call your GI provider to schedule an appointment to discuss pre-opeative risk.

## 2017-01-25 NOTE — Progress Notes (Signed)
HPI  Patient presents today here for follow up chronic medical problems.   Depression with seems to be doing better, patient has not been taking Wellbutrin regularly but states that he tolerates it well.  Diabetes Patient is currently on 10 mg daily of prednisone for gout.  Gout Patient has struggled with gout for many years, he has never been able to successfully start allopurinol, he is currently working with orthopedics and is tolerating 200 mg of allopurinol, he is also being treated with 10 mg of prednisone right now to prevent flares while titrating.  He reports left knee pain and right wrist pain consistent with previous gout flares, he request NSAIDs and Toradol injection.  Patient also states that he had a recent one night stand and would like to be checked for STI's.  He understands that his portal thrombus is a serious situation along with his cirrhosis.  He is frustrated that he cannot find someone that we will do surgery for him, I have also recommended that I would not feel comfortable clearing him for surgery without GIs opinion considering his cirrhosis  PMH: Smoking status noted ROS: Per HPI  Objective: BP (!) 147/82   Pulse 85   Temp (!) 96.6 F (35.9 C) (Oral)   Ht 5' 11"  (1.803 m)   Wt (!) 364 lb 6.4 oz (165.3 kg)   BMI 50.82 kg/m  Gen: NAD, alert, cooperative with exam HEENT: NCAT CV: RRR, good S1/S2, no murmur Resp: CTABL, no wheezes, non-labored Ext: No edema, warm Neuro: Alert and oriented, No gross deficits  Assessment and plan:  #Type 2 diabetes Well controlled with intermittent metformin Discontinue metformin A1c 5.9  #Portal vein thrombosis, cirrhosis Leave this places him at higher surgical risk and without GIs opinion I would not be able to c,ear him medically for surgery.   #Gout Patient with mild flare today, given IM Depo-Medrol Considering clot and previous bleeding history have declined NSAIDs  #High risk sexual behavior HIV,  RPR, he denies any penile symptoms to cause concern for [facility]   Orders Placed This Encounter  Procedures  . Bayer DCA Hb A1c Waived  . CMP14+EGFR  . CBC with Differential/Platelet  . Lipid panel  . TSH  . HIV antibody  . RPR    Meds ordered this encounter  Medications  . buPROPion (WELLBUTRIN XL) 150 MG 24 hr tablet    Sig: Take 1 tablet (150 mg total) by mouth daily.    Dispense:  90 tablet    Refill:  3    This prescription was filled on 10/15/2016. Any refills authorized will be placed on file.  . Colchicine 0.6 MG CAPS    Sig: Take 1 Capsule by mouth 2 times a day as needed    Dispense:  60 capsule    Refill:  1  . ezetimibe (ZETIA) 10 MG tablet    Sig: Take 1 tablet (10 mg total) by mouth daily.    Dispense:  90 tablet    Refill:  3  . glucose blood test strip    Sig: 1 each by Other route as needed for other. Use as instructed    Dispense:  100 each    Refill:  4  . propranolol (INDERAL) 20 MG tablet    Sig: Take 1 tablet (20 mg total) by mouth daily.    Dispense:  90 tablet    Refill:  3  . methylPREDNISolone acetate (DEPO-MEDROL) injection 80 mg    [name], MD  [place] 01/25/2017, 9:41 AM

## 2017-01-25 NOTE — Addendum Note (Signed)
Addended by: Timmothy Euler on: 01/25/2017 02:04 PM   Modules accepted: Orders

## 2017-01-26 LAB — LIPID PANEL
CHOL/HDL RATIO: 2.8 ratio (ref 0.0–5.0)
Cholesterol, Total: 127 mg/dL (ref 100–199)
HDL: 45 mg/dL (ref >39)
LDL CALC: 49 mg/dL (ref 0–99)
TRIGLYCERIDES: 166 mg/dL — AB (ref 0–149)
VLDL Cholesterol Cal: 33 mg/dL (ref 5–40)

## 2017-01-26 LAB — CMP14+EGFR
A/G RATIO: 1.8 (ref 1.2–2.2)
ALK PHOS: 75 IU/L (ref 39–117)
ALT: 25 IU/L (ref 0–44)
AST: 26 IU/L (ref 0–40)
Albumin: 3.9 g/dL (ref 3.5–5.5)
BILIRUBIN TOTAL: 0.3 mg/dL (ref 0.0–1.2)
BUN/Creatinine Ratio: 18 (ref 9–20)
BUN: 14 mg/dL (ref 6–24)
CHLORIDE: 100 mmol/L (ref 96–106)
CO2: 19 mmol/L — ABNORMAL LOW (ref 20–29)
Calcium: 8.4 mg/dL — ABNORMAL LOW (ref 8.7–10.2)
Creatinine, Ser: 0.79 mg/dL (ref 0.76–1.27)
GFR calc Af Amer: 119 mL/min/{1.73_m2} (ref >59)
GFR calc non Af Amer: 103 mL/min/{1.73_m2} (ref >59)
GLUCOSE: 149 mg/dL — AB (ref 65–99)
Globulin, Total: 2.2 g/dL (ref 1.5–4.5)
POTASSIUM: 3.2 mmol/L — AB (ref 3.5–5.2)
Sodium: 137 mmol/L (ref 134–144)
Total Protein: 6.1 g/dL (ref 6.0–8.5)

## 2017-01-26 LAB — CBC WITH DIFFERENTIAL/PLATELET
BASOS ABS: 0 10*3/uL (ref 0.0–0.2)
Basos: 0 %
EOS (ABSOLUTE): 0.1 10*3/uL (ref 0.0–0.4)
Eos: 3 %
HEMATOCRIT: 37.5 % (ref 37.5–51.0)
HEMOGLOBIN: 12.4 g/dL — AB (ref 13.0–17.7)
Immature Grans (Abs): 0 10*3/uL (ref 0.0–0.1)
Immature Granulocytes: 0 %
Lymphocytes Absolute: 0.8 10*3/uL (ref 0.7–3.1)
Lymphs: 33 %
MCH: 27.1 pg (ref 26.6–33.0)
MCHC: 33.1 g/dL (ref 31.5–35.7)
MCV: 82 fL (ref 79–97)
MONOCYTES: 7 %
MONOS ABS: 0.2 10*3/uL (ref 0.1–0.9)
NEUTROS ABS: 1.3 10*3/uL — AB (ref 1.4–7.0)
Neutrophils: 57 %
Platelets: 69 10*3/uL — CL (ref 150–379)
RBC: 4.58 x10E6/uL (ref 4.14–5.80)
RDW: 17.1 % — AB (ref 12.3–15.4)
WBC: 2.3 10*3/uL — AB (ref 3.4–10.8)

## 2017-01-26 LAB — TESTOSTERONE: TESTOSTERONE: 301 ng/dL (ref 264–916)

## 2017-01-26 LAB — RPR: RPR: NONREACTIVE

## 2017-01-26 LAB — TSH: TSH: 3.24 u[IU]/mL (ref 0.450–4.500)

## 2017-01-26 LAB — HIV ANTIBODY (ROUTINE TESTING W REFLEX): HIV Screen 4th Generation wRfx: NONREACTIVE

## 2017-01-28 ENCOUNTER — Other Ambulatory Visit: Payer: Self-pay | Admitting: Family Medicine

## 2017-01-30 DIAGNOSIS — R69 Illness, unspecified: Secondary | ICD-10-CM | POA: Diagnosis not present

## 2017-02-04 DIAGNOSIS — I509 Heart failure, unspecified: Secondary | ICD-10-CM | POA: Diagnosis not present

## 2017-02-04 DIAGNOSIS — K7460 Unspecified cirrhosis of liver: Secondary | ICD-10-CM | POA: Diagnosis not present

## 2017-02-04 DIAGNOSIS — I110 Hypertensive heart disease with heart failure: Secondary | ICD-10-CM | POA: Diagnosis not present

## 2017-02-04 DIAGNOSIS — Z6841 Body Mass Index (BMI) 40.0 and over, adult: Secondary | ICD-10-CM | POA: Diagnosis not present

## 2017-02-04 DIAGNOSIS — E1142 Type 2 diabetes mellitus with diabetic polyneuropathy: Secondary | ICD-10-CM | POA: Diagnosis not present

## 2017-02-04 DIAGNOSIS — E11630 Type 2 diabetes mellitus with periodontal disease: Secondary | ICD-10-CM | POA: Diagnosis not present

## 2017-02-04 DIAGNOSIS — E785 Hyperlipidemia, unspecified: Secondary | ICD-10-CM | POA: Diagnosis not present

## 2017-02-04 DIAGNOSIS — R69 Illness, unspecified: Secondary | ICD-10-CM | POA: Diagnosis not present

## 2017-02-04 DIAGNOSIS — I4891 Unspecified atrial fibrillation: Secondary | ICD-10-CM | POA: Diagnosis not present

## 2017-02-05 ENCOUNTER — Telehealth: Payer: Self-pay | Admitting: Family Medicine

## 2017-02-06 ENCOUNTER — Ambulatory Visit (HOSPITAL_COMMUNITY): Payer: Medicare HMO | Admitting: Adult Health

## 2017-02-07 ENCOUNTER — Encounter (INDEPENDENT_AMBULATORY_CARE_PROVIDER_SITE_OTHER): Payer: Self-pay | Admitting: Orthopaedic Surgery

## 2017-02-07 ENCOUNTER — Ambulatory Visit (INDEPENDENT_AMBULATORY_CARE_PROVIDER_SITE_OTHER): Payer: Medicare HMO | Admitting: Orthopaedic Surgery

## 2017-02-07 ENCOUNTER — Other Ambulatory Visit: Payer: Self-pay | Admitting: Family Medicine

## 2017-02-07 VITALS — BP 111/77 | HR 89 | Ht 71.0 in | Wt 364.0 lb

## 2017-02-07 DIAGNOSIS — M1A0620 Idiopathic chronic gout, left knee, without tophus (tophi): Secondary | ICD-10-CM

## 2017-02-07 DIAGNOSIS — M1A9XX0 Chronic gout, unspecified, without tophus (tophi): Secondary | ICD-10-CM

## 2017-02-07 MED ORDER — BUPIVACAINE HCL 0.5 % IJ SOLN
3.0000 mL | INTRAMUSCULAR | Status: AC | PRN
  Administered 2017-02-07: 3 mL via INTRA_ARTICULAR

## 2017-02-07 MED ORDER — METHYLPREDNISOLONE ACETATE 40 MG/ML IJ SUSP
40.0000 mg | INTRAMUSCULAR | Status: AC | PRN
  Administered 2017-02-07: 40 mg via INTRA_ARTICULAR

## 2017-02-07 MED ORDER — LIDOCAINE HCL 1 % IJ SOLN
0.5000 mL | INTRAMUSCULAR | Status: AC | PRN
  Administered 2017-02-07: .5 mL

## 2017-02-07 NOTE — Progress Notes (Signed)
Office Visit Note   Patient: [name]           Date of Birth: Jul 14, 1965           MRN: [id number] Visit Date: 02/07/2017              Requested by: Timmothy Euler, MD [place], [place] [address] PCP: Timmothy Euler, MD   Assessment & Plan: Visit Diagnoses:  1. Chronic gout without tophus, unspecified cause, unspecified site     Plan: Patient had a list of requests including knee brace, right ankle brace, Visco  supplement injection left knee. We injected  left knee which she tolerated well.  Some 5 mg dose pack given.  Allopurinol 300 p.o. daily multiple refills given.  Recheck 2 months.  Follow-Up Instructions: Return in about 1 month (around 03/07/2017).   Orders:  Orders Placed This Encounter  Procedures  . Large Joint Inj   No orders of the defined types were placed in this encounter.     Procedures: Large Joint Inj: L knee on 02/07/2017 9:26 AM Indications: joint swelling and pain Details: 22 G 1.5 in needle, anterolateral approach  Arthrogram: No  Medications: 0.5 mL lidocaine 1 %; 3 mL bupivacaine 0.5 %; 40 mg methylPREDNISolone acetate 40 MG/ML Outcome: tolerated well, no immediate complications Procedure, treatment alternatives, risks and benefits explained, specific risks discussed. Consent was given by the patient. Immediately prior to procedure a time out was called to verify the correct patient, procedure, equipment, support staff and site/side marked as required. Patient was prepped and draped in the usual sterile fashion.       Clinical Data: No additional findings.   Subjective: Chief Complaint  Patient presents with  . Left Knee - Pain, Follow-up  . Left Ankle - Pain, Follow-up    HPI 52 year old male returns for tophaceous gout.  He is having particular pain in his left knee.  He still also having right ankle pain.  He is tolerating the 200 mg of allopurinol.  He has a few tablets left new prescription given for  allopurinol 300 mg.  Prescription for prednisone 5 mg Dosepak written.  About pain medication since he has been in pain clinics in the past his pain clinic has been moved out of town he is having trouble with rides.  He mentioned that Vicoprofen work particularly good for him in the past and if he did not work with the [facility] did work.  We discussed problems with long-term narcotic medication and I discussed with him that I am happy to continue to treat him for gout and get them taken care of and fixed but the continue narcotic medication is not recommended.  Review of Systems review of systems updated and from last office visit 14 point review of systems.   Objective: Vital Signs: BP 111/77   Pulse 89   Ht 5' 11"  (1.803 m)   Wt (!) 364 lb (165.1 kg)   BMI 50.77 kg/m   Physical Exam  Constitutional: He is oriented to person, place, and time. He appears well-developed and well-nourished.  HENT:  Head: Normocephalic and atraumatic.  Eyes: EOM are normal. Pupils are equal, round, and reactive to light.  Neck: No tracheal deviation present. No thyromegaly present.  Cardiovascular: Normal rate.  Pulmonary/Chest: Effort normal. He has no wheezes.  Abdominal: Soft. Bowel sounds are normal.  Neurological: He is alert and oriented to person, place, and time.  Skin: Skin is warm and  dry. Capillary refill takes less than 2 seconds.  Psychiatric: He has a normal mood and affect. His behavior is normal. Judgment and thought content normal.    Ortho Exam patient has present on left third MP joint.  Plus knee effusion.Marland Kitchen  Specialty Comments:  No specialty comments available.  Imaging: No results found.   PMFS History: Patient Active Problem List   Diagnosis Date Noted  . Depression, recurrent ([place]) 07/18/2016  . Preop cardiovascular exam 06/20/2016  . HLD (hyperlipidemia) 10/31/2015  . Leukopenia 07/08/2015  . Thrombocytopenia ([place]) 07/08/2015  . Portal vein thrombosis 06/08/2015  .  Back pain 02/19/2013  . Neck pain 02/19/2013  . Genu varus   . Hypogonadism male 05/05/2012  . Umbilical hernia, reducible 12/26/2011  . Asthma 08/28/2011  . Cirrhosis ([place]) 08/14/2011  . Rectal bleeding 04/25/2011  . UNSPECIFIED TACHYCARDIA 02/01/2010  . LEG PAIN 02/28/2009  . T2DM (type 2 diabetes mellitus) ([place]) 05/22/2008  . Gout 05/22/2008  . HYPERTENSION, PULMONARY 05/22/2008  . Atrial fibrillation ([place]) 05/22/2008  . DIASTOLIC HEART FAILURE, CHRONIC 05/22/2008  . OBESITY, MORBID 02/21/2007  . CAD 02/21/2007  . OSA (obstructive sleep apnea) 02/04/2007   Past Medical History:  Diagnosis Date  . Anxiety   . Atrial fibrillation ([place])   . CHF (congestive heart failure) ([place])   . Cirrhosis ([place])    NASH  . Constipation   . COPD (chronic obstructive pulmonary disease) ([place])   . Depression, recurrent ([place]) 07/18/2016  . Diabetes mellitus    type 2  . Genu varus   . GERD (gastroesophageal reflux disease)   . Gout   . Heart murmur   . Leukopenia 07/08/2015  . Neuromuscular disorder (HCC)    neuropathy in hands and feet  . Psoriasis   . RA (rheumatoid arthritis) ([place])   . Sleep apnea    cpap used- level 10 and greater  . Thrombocytopenia ([place])     Family History  Problem Relation Age of Onset  . Colon cancer Mother   . Cancer Mother        intestine  . Cancer Father        throat  . Cancer Brother        brain  . Cancer Sister   . Heart attack Neg Hx   . Stroke Neg Hx     Past Surgical History:  Procedure Laterality Date  . ANKLE SURGERY     right ankle talor repair  . CHOLECYSTECTOMY  sept 2016  . CHOLECYSTECTOMY    . COLONOSCOPY  05/08/2011   Procedure: COLONOSCOPY;  Surgeon: Rogene Houston, MD;  [facility];  Service: Endoscopy;  Laterality: N/A;  730  . ESOPHAGOGASTRODUODENOSCOPY  02/28/2011   Procedure: ESOPHAGOGASTRODUODENOSCOPY (EGD);  Surgeon: Rogene Houston, MD;  [facility];  Service: Endoscopy;  Laterality: N/A;  1200  .  ESOPHAGOGASTRODUODENOSCOPY N/A 06/11/2012   Procedure: ESOPHAGOGASTRODUODENOSCOPY (EGD);  Surgeon: Rogene Houston, MD;  [facility];  Service: Endoscopy;  Laterality: N/A;  1200  FYI patient is 400 pounds  . ESOPHAGOGASTRODUODENOSCOPY (EGD) WITH PROPOFOL N/A 04/21/2014   Procedure: ESOPHAGOGASTRODUODENOSCOPY (EGD) WITH PROPOFOL;  Surgeon: Rogene Houston, MD;  Location: [facility];  Service: Endoscopy;  Laterality: N/A;  . HERNIA REPAIR Right    as child; inguinal  . HERNIA REPAIR  sept 2016  . LIVER BIOPSY  2012  . SCIATIC NERVE EXPLORATION    . TONSILLECTOMY     Social History   Occupational History  .  Not on file  Tobacco Use  . Smoking status: Never Smoker  . Smokeless tobacco: Current User    Types: Snuff, Chew  . Tobacco comment: Pt reports that he "dips"  Substance and Sexual Activity  . Alcohol use: No    Alcohol/week: 0.6 oz    Types: 1 Cans of beer per week    Comment: stopped drinking 2-3 yrs ago.   . Drug use: No    Comment: Use to smoke cocaine and marijuana. No IV drug use  . Sexual activity: Yes

## 2017-02-11 ENCOUNTER — Telehealth (INDEPENDENT_AMBULATORY_CARE_PROVIDER_SITE_OTHER): Payer: Self-pay | Admitting: Orthopaedic Surgery

## 2017-02-11 ENCOUNTER — Other Ambulatory Visit: Payer: Self-pay | Admitting: Family Medicine

## 2017-02-11 ENCOUNTER — Other Ambulatory Visit (HOSPITAL_COMMUNITY): Payer: Self-pay | Admitting: *Deleted

## 2017-02-11 DIAGNOSIS — D696 Thrombocytopenia, unspecified: Secondary | ICD-10-CM

## 2017-02-11 NOTE — Telephone Encounter (Signed)
Pt called to dicuss medication

## 2017-02-12 NOTE — Telephone Encounter (Signed)
I called patient. He has not experienced any relief from the knee injection, however, his ankle is some better. He continues to take Allopurinol 260m until that Rx runs out and then he will then take the 3044m  He is getting ready to start the Prednisone Dosepak as soon as he completes the Prednisone 1050mhat he was taking prior. He wantes to know when he can start Naproxen. I advised that he could start that now and he wants to know what that will do to his liver as he has cirrhosis and a problem with his portal vein.  Please advise.

## 2017-02-12 NOTE — Telephone Encounter (Signed)
I called discussed. Liver and renal labs were normal.

## 2017-02-13 ENCOUNTER — Telehealth (INDEPENDENT_AMBULATORY_CARE_PROVIDER_SITE_OTHER): Payer: Self-pay | Admitting: *Deleted

## 2017-02-13 ENCOUNTER — Telehealth: Payer: Self-pay | Admitting: Family Medicine

## 2017-02-13 NOTE — Telephone Encounter (Signed)
Pt is c/o constipation x mths states has taken nothing for it. Wants to speak with nurse.Please Advise.

## 2017-02-13 NOTE — Telephone Encounter (Signed)
Patient left message for Terri to return his call, I called patient back, patient stated something is not right with his testings. Patient wants to talk to Karna Christmas about it.

## 2017-02-13 NOTE — Telephone Encounter (Signed)
Aware.  Provider will be back tomorrow.  He wants something to help with stomach pressure and not having good bowel movements.  ( He suggested script for same drink given for colonoscopy, explained this would  not be  appropriate ).

## 2017-02-14 NOTE — Telephone Encounter (Signed)
For constipation I recommend dulcolax and/or miralax 1 capful daily for a few days.   [name], MD [facility] 02/14/2017, 10:29 AM

## 2017-02-14 NOTE — Telephone Encounter (Signed)
Pt aware.

## 2017-02-15 ENCOUNTER — Other Ambulatory Visit (HOSPITAL_COMMUNITY): Payer: Self-pay | Admitting: Oncology

## 2017-02-15 ENCOUNTER — Other Ambulatory Visit: Payer: Self-pay

## 2017-02-15 ENCOUNTER — Inpatient Hospital Stay (HOSPITAL_COMMUNITY): Payer: Medicare HMO | Attending: Oncology | Admitting: Oncology

## 2017-02-15 ENCOUNTER — Inpatient Hospital Stay (HOSPITAL_COMMUNITY): Payer: Medicare HMO

## 2017-02-15 ENCOUNTER — Encounter (HOSPITAL_COMMUNITY): Payer: Self-pay | Admitting: Oncology

## 2017-02-15 VITALS — BP 131/76 | HR 63 | Temp 98.3°F | Resp 22 | Ht 71.0 in | Wt 369.0 lb

## 2017-02-15 DIAGNOSIS — E1165 Type 2 diabetes mellitus with hyperglycemia: Secondary | ICD-10-CM | POA: Insufficient documentation

## 2017-02-15 DIAGNOSIS — G4733 Obstructive sleep apnea (adult) (pediatric): Secondary | ICD-10-CM

## 2017-02-15 DIAGNOSIS — D72819 Decreased white blood cell count, unspecified: Secondary | ICD-10-CM | POA: Diagnosis not present

## 2017-02-15 DIAGNOSIS — Z7289 Other problems related to lifestyle: Secondary | ICD-10-CM | POA: Diagnosis not present

## 2017-02-15 DIAGNOSIS — D708 Other neutropenia: Secondary | ICD-10-CM | POA: Diagnosis not present

## 2017-02-15 DIAGNOSIS — I5032 Chronic diastolic (congestive) heart failure: Secondary | ICD-10-CM

## 2017-02-15 DIAGNOSIS — R69 Illness, unspecified: Secondary | ICD-10-CM | POA: Diagnosis not present

## 2017-02-15 DIAGNOSIS — D61818 Other pancytopenia: Secondary | ICD-10-CM | POA: Insufficient documentation

## 2017-02-15 DIAGNOSIS — E782 Mixed hyperlipidemia: Secondary | ICD-10-CM

## 2017-02-15 DIAGNOSIS — K7030 Alcoholic cirrhosis of liver without ascites: Secondary | ICD-10-CM

## 2017-02-15 DIAGNOSIS — D696 Thrombocytopenia, unspecified: Secondary | ICD-10-CM

## 2017-02-15 DIAGNOSIS — F339 Major depressive disorder, recurrent, unspecified: Secondary | ICD-10-CM | POA: Insufficient documentation

## 2017-02-15 DIAGNOSIS — I480 Paroxysmal atrial fibrillation: Secondary | ICD-10-CM

## 2017-02-15 DIAGNOSIS — R1012 Left upper quadrant pain: Secondary | ICD-10-CM

## 2017-02-15 NOTE — Patient Instructions (Addendum)
Lab work from primary care provider is reviewed and stable. Laboratory work in 6 months Follow-up with GI doctors as directed.

## 2017-02-15 NOTE — Progress Notes (Signed)
[name] Euler, MD [place] [id number]  Other pancytopenia [facility]) - Plan: CBC with Differential, Comprehensive metabolic panel, Iron and TIBC, Ferritin, Vitamin B12, Folate, Methylmalonic acid, serum  Other neutropenia (HCC) - Plan: CBC with Differential, Comprehensive metabolic panel, Iron and TIBC, Ferritin, Vitamin B12, Folate, Methylmalonic acid, serum  Thrombocytopenia (HCC) - Plan: CBC with Differential, Iron and TIBC, Ferritin, Vitamin Y18, Folate  Alcoholic cirrhosis of liver without ascites ([place]) - Plan: CBC with Differential, Comprehensive metabolic panel, AFP tumor marker, CANCELED: US Abdomen Complete  OBESITY, MORBID  OSA (obstructive sleep apnea)  Type 2 diabetes mellitus with hyperglycemia, without long-term current use of insulin (HCC)  Paroxysmal atrial fibrillation (HCC)  DIASTOLIC HEART FAILURE, CHRONIC  Mixed hyperlipidemia  Depression, recurrent (HCC)  CURRENT THERAPY: Observation  INTERVAL HISTORY: [name] 52 y.o. male returns for followup of leukopenia with neutropenia predominance and thrombocytopenia secondary to cirrhosis of liver from alcohol, in addition to normocytic, normochromic anemia.Marland Kitchen  HPI Elements   Location:  Blood  Quality:  Pancytopenia  Severity:  Moderate  Duration:  Since at least December 2012  Context:  In the setting of cirrhosis secondary to alcoholism  Timing:   Modifying Factors:  Poor compliance  Associated Signs & Symptoms:    He missed his GI appointment in December 2018.  He is advised to follow-up with his primary care provider to reschedule this missed appointment.  He is being seen by pain management, but the patient reports that they recently moved to [facility] and he is unable to ascertain transportation to [facility] for his pain management appointments.  He reports that he is going to work on pain management at home.  I have offered him referral to pain management in  [place], but he declined at this time.  He reports a left upper quadrant abdominal pain that comes and goes.  Is not present today.  He notes constipation as well.  He is using lactulose at home that is being managed by his GI providers for cirrhosis.  He continues to drink alcohol but he reports a significant decrease in his alcohol intake.  Review of Systems  Constitutional: Negative.  Negative for chills, fever and weight loss.  HENT: Negative.   Eyes: Negative.   Respiratory: Negative.  Negative for cough.   Cardiovascular: Negative.  Negative for chest pain.  Gastrointestinal: Positive for abdominal pain and constipation. Negative for blood in stool, diarrhea, melena, nausea and vomiting.  Genitourinary: Negative.   Musculoskeletal: Negative.   Skin: Negative.   Neurological: Negative.  Negative for weakness.  Endo/Heme/Allergies: Negative.   Psychiatric/Behavioral: Positive for substance abuse (EtOH and chewing tobacco).    Past Medical History:  Diagnosis Date  . Anxiety   . Atrial fibrillation ([place])   . CHF (congestive heart failure) ([place])   . Cirrhosis ([place])    NASH  . Constipation   . COPD (chronic obstructive pulmonary disease) ([place])   . Depression, recurrent ([place]) 07/18/2016  . Diabetes mellitus    type 2  . Genu varus   . GERD (gastroesophageal reflux disease)   . Gout   . Heart murmur   . Leukopenia 07/08/2015  . Neuromuscular disorder (HCC)    neuropathy in hands and feet  . Psoriasis   . RA (rheumatoid arthritis) ([place])   . Sleep apnea    cpap used- level 10 and greater  . Thrombocytopenia ([place])     Past Surgical History:  Procedure Laterality  Date  . ANKLE SURGERY     right ankle talor repair  . CHOLECYSTECTOMY  sept 2016  . CHOLECYSTECTOMY    . COLONOSCOPY  05/08/2011   Procedure: COLONOSCOPY;  Surgeon: Rogene Houston, MD;  [facility];  Service: Endoscopy;  Laterality: N/A;  730  . ESOPHAGOGASTRODUODENOSCOPY  02/28/2011   Procedure:  ESOPHAGOGASTRODUODENOSCOPY (EGD);  Surgeon: Rogene Houston, MD;  [facility];  Service: Endoscopy;  Laterality: N/A;  1200  . ESOPHAGOGASTRODUODENOSCOPY N/A 06/11/2012   Procedure: ESOPHAGOGASTRODUODENOSCOPY (EGD);  Surgeon: Rogene Houston, MD;  [facility];  Service: Endoscopy;  Laterality: N/A;  1200  FYI patient is 400 pounds  . ESOPHAGOGASTRODUODENOSCOPY (EGD) WITH PROPOFOL N/A 04/21/2014   Procedure: ESOPHAGOGASTRODUODENOSCOPY (EGD) WITH PROPOFOL;  Surgeon: Rogene Houston, MD;  Location: [facility];  Service: Endoscopy;  Laterality: N/A;  . HERNIA REPAIR Right    as child; inguinal  . HERNIA REPAIR  sept 2016  . LIVER BIOPSY  2012  . SCIATIC NERVE EXPLORATION    . TONSILLECTOMY      Family History  Problem Relation Age of Onset  . Colon cancer Mother   . Cancer Mother        intestine  . Cancer Father        throat  . Cancer Brother        brain  . Cancer Sister   . Heart attack Neg Hx   . Stroke Neg Hx     Social History   Socioeconomic History  . Marital status: Divorced    Spouse name: None  . Number of children: None  . Years of education: None  . Highest education level: None  Social Needs  . Financial resource strain: None  . Food insecurity - worry: None  . Food insecurity - inability: None  . Transportation needs - medical: None  . Transportation needs - non-medical: None  Occupational History  . None  Tobacco Use  . Smoking status: Never Smoker  . Smokeless tobacco: Current User    Types: Snuff, Chew  . Tobacco comment: Pt reports that he "dips"  Substance and Sexual Activity  . Alcohol use: No    Alcohol/week: 0.6 oz    Types: 1 Cans of beer per week    Comment: stopped drinking 2-3 yrs ago.   . Drug use: No    Comment: Use to smoke cocaine and marijuana. No IV drug use  . Sexual activity: Yes  Other Topics Concern  . None  Social History Narrative  . None     PHYSICAL EXAMINATION  ECOG PERFORMANCE STATUS: 1 -  Symptomatic but completely ambulatory  Vitals:   02/15/17 0910  BP: 131/76  Pulse: 63  Resp: (!) 22  Temp: 98.3 F (36.8 C)  SpO2: 98%    GENERAL:alert, no distress, comfortable, cooperative, obese and malodorous, chewing tobacco with spit on his shirt and abdomen on examination SKIN: skin color, texture, turgor are normal, no rashes or significant lesions HEAD: Normocephalic, No masses, lesions, tenderness or abnormalities EYES: normal, EOMI EARS: External ears normal OROPHARYNX:lips, buccal mucosa, and tongue normal and chewing tobacco in the left lower lip NECK: supple, trachea midline LYMPH:  no palpable lymphadenopathy BREAST:not examined LUNGS: clear to auscultation  HEART: regular rate & rhythm, no murmurs and no gallops ABDOMEN:abdomen soft, obese and abdominal hernia noted that is reducible BACK: Back symmetric, no curvature. EXTREMITIES:less then 2 second capillary refill, no joint deformities, effusion, or inflammation, no cyanosis,  positive findings:  edema 1+ pitting bilaterally NEURO: alert & oriented x 3 with fluent speech, no focal motor/sensory deficits, gait normal   LABORATORY DATA: CBC    Component Value Date/Time   WBC 2.3 (LL) 01/25/2017 1035   WBC 2.8 (L) 07/19/2016 1247   RBC 4.58 01/25/2017 1035   RBC 4.46 07/19/2016 1247   HGB 12.4 (L) 01/25/2017 1035   HCT 37.5 01/25/2017 1035   PLT 69 (LL) 01/25/2017 1035   MCV 82 01/25/2017 1035   MCH 27.1 01/25/2017 1035   MCH 26.0 07/19/2016 1247   MCHC 33.1 01/25/2017 1035   MCHC 32.7 07/19/2016 1247   RDW 17.1 (H) 01/25/2017 1035   LYMPHSABS 0.8 01/25/2017 1035   MONOABS 0.1 03/14/2016 0850   EOSABS 0.1 01/25/2017 1035   BASOSABS 0.0 01/25/2017 1035      Chemistry      Component Value Date/Time   NA 137 01/25/2017 1035   K 3.2 (L) 01/25/2017 1035   CL 100 01/25/2017 1035   CO2 19 (L) 01/25/2017 1035   BUN 14 01/25/2017 1035   CREATININE 0.79 01/25/2017 1035   CREATININE 0.67 (L) 09/06/2014  0820      Component Value Date/Time   CALCIUM 8.4 (L) 01/25/2017 1035   ALKPHOS 75 01/25/2017 1035   AST 26 01/25/2017 1035   ALT 25 01/25/2017 1035   BILITOT 0.3 01/25/2017 1035     Lab Results  Component Value Date   ZHGDJMEQ68 341 09/02/2015   Lab Results  Component Value Date   FOLATE 20.8 09/02/2015   Lab Results  Component Value Date   FERRITIN 29 03/14/2016   No results found for: AFP   PENDING LABS:   RADIOGRAPHIC STUDIES:  No results found.   PATHOLOGY:    ASSESSMENT AND PLAN:  Leukopenia Long-standing leukopenia/thrombocytopenia secondary to cirrhosis due to EtOH and possible NAFLD and splenomegaly.  He is status post bone marrow aspiration and biopsy on 10/24/2015 that demonstrated normal cellular bone marrow with trilineage hematopoiesis and normal cytogenetics..  Labs deferred today at the patient's request.  Blood counts from 01/25/2017 by his primary care provider demonstrated a white blood cell count of 2.3, mild anemia at 12.4 g/dL, platelet count 69,000, and an ANC of 1.3.  I personally reviewed and went over laboratory results with the patient.  The results are noted within this dictation.  Labs in 6 months: CBC diff, CMET, iron/TIBC, ferritin, B12, folate, MMA, AFP.  US liver every 6 months performed by primary GI provider(s).  [place] surveillance for those at high risk of developing [place], such as those patients with HBV, HCV, cirrhosis of liver recommends [place] of liver at 6 month intervals.  The incidence of [place] is 3 to 8 percent per year among patients with cirrhosis from HBV, HCV, or primary biliary cirrhosis The 2010 American Association for the Study of Liver Diseases (AASLD) guideline on the management of hepatocellular carcinoma ([place]) recommends that surveillance be performed using ultrasonography at six-month intervals.  The sensitivity of ultrasound for detecting HCC has been estimated at 94 percent, though it drops to 63 percent for detecting early  [place].  The six-month interval is based primarily on observational data, the expected growth rates of [place], and preliminary data suggesting that survival is better when surveillance is performed every six months rather than every 12 months.  The surveillance interval is a function of the tumor growth rate, not the degree of risk of developing HCC. Thus, the surveillance interval does not need  to be shortened for patients at higher-risk for [facility].   Other tests that have been proposed for surveillance, such as the ratio of the L3 fraction of AFP to total AFP, des-gamma-carboxy prothrombin, and many other serum proteins or RNA molecules. However, none of these has been adequately studied as a surveillance test, and they cannot be recommended.   The combined use of AFP and ultrasonography increases detection rates, but it also increases costs and false-positive rates and is not recommended by the AASLD. Because of its poor sensitivity and specificity, alpha-fetoprotein testing alone should not be used unless ultrasound is unavailable.  Computed tomographic scanning is not recommended for surveillance because of a high false-positive rate and the risks associated with cumulative radiation exposure from repeated scans.   -Up-to-Date: "Prevention of hepatocellular carcinoma and recommendations for surveillance in adults with chronic liver disease"  Return in 6 months for follow-up.    Thrombocytopenia (HCC) Thrombocytopenia, secondary to cirrhosis of liver.  1. Other pancytopenia ([place]) He officially has pancytopenia with a mild normocytic normochromic anemia.  4. Alcoholic cirrhosis of liver without ascites ([place]) Ongoing alcohol use.  Alcohol cessation education provided today.  He continues to drink "light" beer reports a decrease in his drinking.  He knows to 3-5 drinks per week.  He denies any liquor use. - CBC with Differential; Future - Comprehensive metabolic panel; Future - AFP tumor marker;  Future  5. OBESITY, MORBID Weighing 369 pounds today on a 5 foot 11 frame.  6. OSA (obstructive sleep apnea) Secondary to obesity  7. Type 2 diabetes mellitus with hyperglycemia, without long-term current use of insulin ([place]) Managed by his primary care provider.  Last documented hemoglobin A1c is 5.9 in 2015.  8. Paroxysmal atrial fibrillation (HCC) Normal sinus rhythm on examination today.  9. DIASTOLIC HEART FAILURE, CHRONIC No signs or symptoms today of acute heart failure.  Well compensated.  10. Mixed hyperlipidemia Noted in stable  11. Depression, recurrent ([place]) Persistent and stable.  Final Result of Complexity      Choose decision making level with 2 or 3 checks OR choose the decision making level on Section B       A Number of diagnoses or treatment options  []   </= 1 Minimal  []   2 Limited  []   3 Multiple  [x]   >/= 4 Extensive  B Amount and complexity of data  []   </= 1 Minimal or low  []   2 Limited  [x]   3  Moderate  []   >/= 4 Extensive  C Highest risk  []   Minimal  []   Low  []   Moderate  [x]   High   Type of decision making  []   Straight-forward  []   Low Complexity  []   Moderate- Complexity  [x]   High- Complexity     ORDERS PLACED FOR THIS ENCOUNTER: Orders Placed This Encounter  Procedures  . CBC with Differential  . Comprehensive metabolic panel  . Iron and TIBC  . Ferritin  . Vitamin B12  . Folate  . Methylmalonic acid, serum  . AFP tumor marker    MEDICATIONS PRESCRIBED THIS ENCOUNTER: No orders of the defined types were placed in this encounter.   THERAPY PLAN:  Continue to monitor blood counts and monitor for vitamin deficiencies.  [place] surveillance per primary GI specialist.  All questions were answered. The patient knows to call the clinic with any problems, questions or concerns. We can certainly see the patient much sooner if necessary.  Patient and  plan discussed with Dr. Irene Limbo and he is in agreement with the  aforementioned.   This note is electronically signed by: Robynn Pane, PA-C 02/15/2017 9:35 AM

## 2017-02-15 NOTE — Assessment & Plan Note (Signed)
Thrombocytopenia, secondary to cirrhosis of liver.

## 2017-02-15 NOTE — Assessment & Plan Note (Addendum)
Long-standing leukopenia/thrombocytopenia secondary to cirrhosis due to EtOH and possible NAFLD and splenomegaly.  He is status post bone marrow aspiration and biopsy on 10/24/2015 that demonstrated normal cellular bone marrow with trilineage hematopoiesis and normal cytogenetics..  Labs deferred today at the patient's request.  Blood counts from 01/25/2017 by his primary care provider demonstrated a white blood cell count of 2.3, mild anemia at 12.4 g/dL, platelet count 69,000, and an ANC of 1.3.  I personally reviewed and went over laboratory results with the patient.  The results are noted within this dictation.  Labs in 6 months: CBC diff, CMET, iron/TIBC, ferritin, B12, folate, MMA, AFP.  US liver every 6 months performed by primary GI provider(s).  [place] surveillance for those at high risk of developing [place], such as those patients with HBV, HCV, cirrhosis of liver recommends [place] of liver at 6 month intervals.  The incidence of [place] is 3 to 8 percent per year among patients with cirrhosis from HBV, HCV, or primary biliary cirrhosis The 2010 American Association for the Study of Liver Diseases (AASLD) guideline on the management of hepatocellular carcinoma ([place]) recommends that surveillance be performed using ultrasonography at six-month intervals.  The sensitivity of ultrasound for detecting HCC has been estimated at 94 percent, though it drops to 63 percent for detecting early [place].  The six-month interval is based primarily on observational data, the expected growth rates of [place], and preliminary data suggesting that survival is better when surveillance is performed every six months rather than every 12 months.  The surveillance interval is a function of the tumor growth rate, not the degree of risk of developing HCC. Thus, the surveillance interval does not need to be shortened for patients at higher-risk for [facility].   Other tests that have been proposed for surveillance, such as the ratio of the L3 fraction  of AFP to total AFP, des-gamma-carboxy prothrombin, and many other serum proteins or RNA molecules. However, none of these has been adequately studied as a surveillance test, and they cannot be recommended.   The combined use of AFP and ultrasonography increases detection rates, but it also increases costs and false-positive rates and is not recommended by the AASLD. Because of its poor sensitivity and specificity, alpha-fetoprotein testing alone should not be used unless ultrasound is unavailable.  Computed tomographic scanning is not recommended for surveillance because of a high false-positive rate and the risks associated with cumulative radiation exposure from repeated scans.   -Up-to-Date: "Prevention of hepatocellular carcinoma and recommendations for surveillance in adults with chronic liver disease"  Return in 6 months for follow-up.

## 2017-02-19 NOTE — Telephone Encounter (Signed)
Patient called for Terri to call him. Please advise [phone]

## 2017-02-19 NOTE — Telephone Encounter (Signed)
No answer. Will call tomorrow.

## 2017-02-19 NOTE — Telephone Encounter (Signed)
I have talked with patient. He has weight gain. He is concerned about his hernia.

## 2017-02-20 ENCOUNTER — Other Ambulatory Visit: Payer: Self-pay | Admitting: Family Medicine

## 2017-02-25 ENCOUNTER — Telehealth: Payer: Self-pay | Admitting: Family Medicine

## 2017-02-25 NOTE — Telephone Encounter (Signed)
Pt wants to increase Welbutrin Med worked well in the beginning but pt feels like med needs to be increased Please advise

## 2017-02-26 ENCOUNTER — Telehealth: Payer: Self-pay | Admitting: Family Medicine

## 2017-02-26 MED ORDER — BUPROPION HCL ER (XL) 300 MG PO TB24: 300.0000 mg | ORAL_TABLET | Freq: Every day | ORAL | 3 refills | Status: DC

## 2017-02-26 NOTE — Telephone Encounter (Signed)
Titrate to 300 mg buproprion.    [name], MD [facility] 02/26/2017, 7:40 AM

## 2017-02-26 NOTE — Telephone Encounter (Signed)
Pt aware new rx for increased dose sent into pharmacy.

## 2017-02-26 NOTE — Telephone Encounter (Signed)
Rx sent to the correct pharmacy  

## 2017-03-05 ENCOUNTER — Telehealth (INDEPENDENT_AMBULATORY_CARE_PROVIDER_SITE_OTHER): Payer: Self-pay | Admitting: Orthopaedic Surgery

## 2017-03-05 NOTE — Telephone Encounter (Signed)
Please advise 

## 2017-03-05 NOTE — Telephone Encounter (Signed)
Patient said he has tolerated the allopurinol 300 mg well but last night his hand and wrist has started hurting and began to swell. Is there something else he can take to help? Please advise # [phone]

## 2017-03-05 NOTE — Telephone Encounter (Signed)
Take aleve 2 po bid with food. ucall

## 2017-03-06 DIAGNOSIS — Z7984 Long term (current) use of oral hypoglycemic drugs: Secondary | ICD-10-CM | POA: Diagnosis not present

## 2017-03-06 DIAGNOSIS — M109 Gout, unspecified: Secondary | ICD-10-CM | POA: Diagnosis not present

## 2017-03-06 DIAGNOSIS — K7460 Unspecified cirrhosis of liver: Secondary | ICD-10-CM | POA: Diagnosis not present

## 2017-03-06 DIAGNOSIS — M10031 Idiopathic gout, right wrist: Secondary | ICD-10-CM | POA: Diagnosis not present

## 2017-03-06 DIAGNOSIS — M1990 Unspecified osteoarthritis, unspecified site: Secondary | ICD-10-CM | POA: Diagnosis not present

## 2017-03-06 DIAGNOSIS — I509 Heart failure, unspecified: Secondary | ICD-10-CM | POA: Diagnosis not present

## 2017-03-06 DIAGNOSIS — J449 Chronic obstructive pulmonary disease, unspecified: Secondary | ICD-10-CM | POA: Diagnosis not present

## 2017-03-06 DIAGNOSIS — E1140 Type 2 diabetes mellitus with diabetic neuropathy, unspecified: Secondary | ICD-10-CM | POA: Diagnosis not present

## 2017-03-06 DIAGNOSIS — K219 Gastro-esophageal reflux disease without esophagitis: Secondary | ICD-10-CM | POA: Diagnosis not present

## 2017-03-06 DIAGNOSIS — I110 Hypertensive heart disease with heart failure: Secondary | ICD-10-CM | POA: Diagnosis not present

## 2017-03-06 DIAGNOSIS — Z8249 Family history of ischemic heart disease and other diseases of the circulatory system: Secondary | ICD-10-CM | POA: Diagnosis not present

## 2017-03-06 NOTE — Telephone Encounter (Signed)
Patient called back and states he spoke with his insurance company in regards to authorization for Orthovisc. He states the number I need to call to get approval is [phone]. I advised as soon as I have answer from Dr. Lorin Mercy regarding injection, I will let him know.

## 2017-03-06 NOTE — Telephone Encounter (Signed)
Patient called back to speak with me. He states that he went to [facility] last night because the pain got so bad. He was given and injection of toradol which has helped. I advised about the aleve.  He states that he was previously going to get a gel injection in his left knee by [facility]. He changed to our practice because he could be seen in [place] and he is unable to get to [place].  He would like for [place] to get approval for gel injection left knee so that this can be done at his follow up visit on 04/04/17.  Please advise on ordering gel injection.

## 2017-03-06 NOTE — Telephone Encounter (Signed)
I left voicemail for patient advising. 

## 2017-03-06 NOTE — Telephone Encounter (Signed)
UCALL rov FOR  Injection, cortisone more likely to help .

## 2017-03-07 NOTE — Telephone Encounter (Signed)
Please see messages below. Patient wants Orthovisc Series of #3 injections.  He states he has already called his insurance company and gave that information below.  Please let me know on approval. Thanks.

## 2017-03-07 NOTE — Telephone Encounter (Signed)
I called patient to offer appointment for cortisone injection. He does not want to get that. He states that his knee is not bothering him right now, and that it is more his wrist which is why he went to the hospital. He has been taking ibuprofen. I explained that Dr. Lorin Mercy advised he try aleve, but the patient states he has not been out to get any. He wants for [place] to order Orthovisc series for his knees. He states that this is the reason he called and he wants to start this at his next appointment. He has heard that he can do these routinely and it will buy him some time before knee replacements. He states that he has discussed this with Dr. Lorin Mercy and the ED before.  Please advise.

## 2017-03-07 NOTE — Telephone Encounter (Signed)
OK to seek approval. thanks

## 2017-03-08 NOTE — Telephone Encounter (Signed)
Submitted verification of benefits for bilateral knee, Orthovisc online.  Will let you know when this is approved.

## 2017-03-11 ENCOUNTER — Other Ambulatory Visit: Payer: Self-pay | Admitting: Family Medicine

## 2017-03-12 NOTE — Telephone Encounter (Signed)
Last seen 1.18.19  Dr Wendi Snipes

## 2017-03-13 ENCOUNTER — Telehealth (INDEPENDENT_AMBULATORY_CARE_PROVIDER_SITE_OTHER): Payer: Self-pay | Admitting: Orthopaedic Surgery

## 2017-03-13 NOTE — Telephone Encounter (Signed)
Patient called wanting to speak with you about his gout, had a few questions for you. CB # [id number]

## 2017-03-14 NOTE — Telephone Encounter (Signed)
I called and spoke with patient. He feels like the Allopurinol 341m is too much. He states that he had a "bad case of gout" and had to go to the hospital. He was taking Allopurinol 1058mto begin with and did fine with that. He stopped the Allopurinol 30042mnd got better on the Colchicine and Indomethacin. He would like to know if you want to put him on the Allopurinol 100m73mr a longer period of time?

## 2017-03-14 NOTE — Telephone Encounter (Signed)
Gout flare went to emergency room and got a Toradol shot wrist still hurts.  Take one half of an allopurinol tablet continue the colchicine continue the indomethacin and after a few weeks go to three fourths of his allopurinol tablet and then gradually back to full allopurinol tablet.  He has appointment at the end of the month we discussed again gout in the extra crystals that he has.

## 2017-03-26 ENCOUNTER — Other Ambulatory Visit: Payer: Self-pay | Admitting: Family Medicine

## 2017-03-27 ENCOUNTER — Telehealth (INDEPENDENT_AMBULATORY_CARE_PROVIDER_SITE_OTHER): Payer: Self-pay

## 2017-03-27 NOTE — Telephone Encounter (Signed)
Faxed completed PA form to [facility] at [phone].

## 2017-03-28 ENCOUNTER — Telehealth (INDEPENDENT_AMBULATORY_CARE_PROVIDER_SITE_OTHER): Payer: Self-pay

## 2017-03-28 NOTE — Telephone Encounter (Signed)
Received fax from [facility] stating that medical notes needed to be faxed for Monovisc Injection. Faxed medical/office notes to [facility] at [phone].

## 2017-04-01 ENCOUNTER — Other Ambulatory Visit: Payer: Self-pay | Admitting: Family Medicine

## 2017-04-01 ENCOUNTER — Telehealth (INDEPENDENT_AMBULATORY_CARE_PROVIDER_SITE_OTHER): Payer: Self-pay | Admitting: Orthopaedic Surgery

## 2017-04-01 NOTE — Telephone Encounter (Signed)
Patient left voicemail for a return call from you. CB # [id number]

## 2017-04-02 ENCOUNTER — Telehealth (INDEPENDENT_AMBULATORY_CARE_PROVIDER_SITE_OTHER): Payer: Self-pay

## 2017-04-02 ENCOUNTER — Telehealth (INDEPENDENT_AMBULATORY_CARE_PROVIDER_SITE_OTHER): Payer: Self-pay | Admitting: Orthopaedic Surgery

## 2017-04-02 NOTE — Telephone Encounter (Signed)
Patient left a voicemail requesting call back from you before his appt Thursday in [place]. Please call # [phone]

## 2017-04-02 NOTE — Telephone Encounter (Signed)
Duplicate message. 

## 2017-04-02 NOTE — Telephone Encounter (Signed)
Patient would like a Rx refill on Allopurinol, but would like 150m instead of 30104m  Cb# is [phone] FYI- Talked with patient and advised him that he is approved to have Orthovisc Series for Bilateral Knee.  Stated that he only needed Orthovisc injection for one knee.

## 2017-04-02 NOTE — Telephone Encounter (Signed)
Patient has spoken with [name] and note is in chart regarding change of strength of Allopurinol.

## 2017-04-02 NOTE — Telephone Encounter (Signed)
Please advise on Allopurinol.

## 2017-04-02 NOTE — Telephone Encounter (Signed)
He can get visco injection if approved. OK for allopurinol 144m daily.

## 2017-04-03 ENCOUNTER — Telehealth: Payer: Self-pay

## 2017-04-03 MED ORDER — ALLOPURINOL 100 MG PO TABS: ORAL_TABLET | ORAL | 2 refills | Status: DC

## 2017-04-03 NOTE — Telephone Encounter (Signed)
Injections approved per [name], [place].  Patient aware.

## 2017-04-03 NOTE — Telephone Encounter (Signed)
Patient states that he would like to know what he can do for weight loss? States that he has gained weight and would like to lose it. Please advise

## 2017-04-03 NOTE — Addendum Note (Signed)
Addended by: Meyer Cory on: 04/03/2017 11:12 AM   Modules accepted: Orders

## 2017-04-03 NOTE — Telephone Encounter (Signed)
Sent to pharmacy. Patient aware.

## 2017-04-04 ENCOUNTER — Ambulatory Visit (INDEPENDENT_AMBULATORY_CARE_PROVIDER_SITE_OTHER): Payer: Medicare HMO | Admitting: Orthopaedic Surgery

## 2017-04-04 ENCOUNTER — Encounter (INDEPENDENT_AMBULATORY_CARE_PROVIDER_SITE_OTHER): Payer: Self-pay | Admitting: Orthopaedic Surgery

## 2017-04-04 VITALS — BP 120/74 | HR 78 | Ht 71.0 in | Wt 370.0 lb

## 2017-04-04 DIAGNOSIS — M1712 Unilateral primary osteoarthritis, left knee: Secondary | ICD-10-CM

## 2017-04-04 DIAGNOSIS — M170 Bilateral primary osteoarthritis of knee: Secondary | ICD-10-CM | POA: Insufficient documentation

## 2017-04-04 MED ORDER — LIDOCAINE HCL 1 % IJ SOLN
0.5000 mL | INTRAMUSCULAR | Status: AC | PRN
  Administered 2017-04-04: .5 mL

## 2017-04-04 MED ORDER — HYALURONAN 30 MG/2ML IX SOSY
30.0000 mg | PREFILLED_SYRINGE | INTRA_ARTICULAR | Status: AC | PRN
  Administered 2017-04-04: 30 mg via INTRA_ARTICULAR

## 2017-04-04 NOTE — Telephone Encounter (Signed)
Change diet and exercise are my primary recommendations. Consider weight watchers.   [name], MD [facility] 04/04/2017, 7:48 AM

## 2017-04-04 NOTE — Addendum Note (Signed)
Addended by: Timmothy Euler on: 04/04/2017 10:07 AM   Modules accepted: Orders

## 2017-04-04 NOTE — Telephone Encounter (Signed)
Pt aware.

## 2017-04-04 NOTE — Progress Notes (Signed)
Office Visit Note   Patient: [name]           Date of Birth: [birth year]           MRN: [id number] Visit Date: 04/04/2017              Requested by: Timmothy Euler, MD [place], [place] [address] PCP: Timmothy Euler, MD   Assessment & Plan: Visit Diagnoses:  1. Bilateral primary osteoarthritis of knee     Plan: Orthovisc injection performed left knee.  Return 2 weeks for second injection.  Follow-Up Instructions: Return in about 2 weeks (around 04/18/2017).   Orders:  Orders Placed This Encounter  Procedures  . Large Joint Inj: L knee   No orders of the defined types were placed in this encounter.     Procedures: Large Joint Inj: L knee on 04/04/2017 9:43 AM Indications: joint swelling and pain Details: 22 G 1.5 in needle, anterolateral approach  Arthrogram: No  Medications: 0.5 mL lidocaine 1 %; 30 mg Hyaluronan 30 MG/2ML Outcome: tolerated well, no immediate complications Procedure, treatment alternatives, risks and benefits explained, specific risks discussed. Consent was given by the patient. Immediately prior to procedure a time out was called to verify the correct patient, procedure, equipment, support staff and site/side marked as required. Patient was prepped and draped in the usual sterile fashion.       Clinical Data: No additional findings.   Subjective: Chief Complaint  Patient presents with  . Left Knee - Pain    Orthovisc Injection #95    HPI 52 year old male returns for Orthovisc injection left knee.  BMI is greater than 40 he has osteoarthritis also has significant gout which is tophaceous.  He is taken anti-inflammatories currently he is on allopurinol 100 mg and has had trouble increasing the allopurinol due to flares in his gout symptoms.  Review of Systems updated and unchanged.   Objective: Vital Signs: BP 120/74   Pulse 78   Ht 5' 11"  (1.803 m)   Wt (!) 370 lb (167.8 kg)   BMI 51.60 kg/m   Physical Exam   Constitutional: He is oriented to person, place, and time. He appears well-developed and well-nourished.  HENT:  Head: Normocephalic and atraumatic.  Eyes: Pupils are equal, round, and reactive to light. EOM are normal.  Neck: No tracheal deviation present. No thyromegaly present.  Cardiovascular: Normal rate.  Pulmonary/Chest: Effort normal. He has no wheezes.  Abdominal: Soft. Bowel sounds are normal.  Abdominal hernia infraumbilical  Neurological: He is alert and oriented to person, place, and time.  Skin: Skin is warm and dry. Capillary refill takes less than 2 seconds.  Psychiatric: He has a normal mood and affect. His behavior is normal. Judgment and thought content normal.    Ortho Exam crepitus effusion left knee.  Pain with joint line palpation.  Specialty Comments:  No specialty comments available.  Imaging: No results found.   PMFS History: Patient Active Problem List   Diagnosis Date Noted  . Bilateral primary osteoarthritis of knee 04/04/2017  . Depression, recurrent ([place]) 07/18/2016  . Preop cardiovascular exam 06/20/2016  . HLD (hyperlipidemia) 10/31/2015  . Leukopenia 07/08/2015  . Thrombocytopenia ([place]) 07/08/2015  . Portal vein thrombosis 06/08/2015  . Back pain 02/19/2013  . Neck pain 02/19/2013  . Genu varus   . Hypogonadism male 05/05/2012  . Umbilical hernia, reducible 12/26/2011  . Asthma 08/28/2011  . Cirrhosis ([place]) 08/14/2011  . Rectal bleeding 04/25/2011  .  UNSPECIFIED TACHYCARDIA 02/01/2010  . LEG PAIN 02/28/2009  . T2DM (type 2 diabetes mellitus) ([place]) 05/22/2008  . Gout 05/22/2008  . HYPERTENSION, PULMONARY 05/22/2008  . Atrial fibrillation ([place]) 05/22/2008  . DIASTOLIC HEART FAILURE, CHRONIC 05/22/2008  . OBESITY, MORBID 02/21/2007  . CAD 02/21/2007  . OSA (obstructive sleep apnea) 02/04/2007   Past Medical History:  Diagnosis Date  . Anxiety   . Atrial fibrillation ([place])   . CHF (congestive heart failure) ([place])   . Cirrhosis  ([place])    NASH  . Constipation   . COPD (chronic obstructive pulmonary disease) ([place])   . Depression, recurrent ([place]) 07/18/2016  . Diabetes mellitus    type 2  . Genu varus   . GERD (gastroesophageal reflux disease)   . Gout   . Heart murmur   . Leukopenia 07/08/2015  . Neuromuscular disorder (HCC)    neuropathy in hands and feet  . Psoriasis   . RA (rheumatoid arthritis) ([place])   . Sleep apnea    cpap used- level 10 and greater  . Thrombocytopenia ([place])     Family History  Problem Relation Age of Onset  . Colon cancer Mother   . Cancer Mother        intestine  . Cancer Father        throat  . Cancer Brother        brain  . Cancer Sister   . Heart attack Neg Hx   . Stroke Neg Hx     Past Surgical History:  Procedure Laterality Date  . ANKLE SURGERY     right ankle talor repair  . CHOLECYSTECTOMY  sept 2016  . CHOLECYSTECTOMY    . COLONOSCOPY  05/08/2011   Procedure: COLONOSCOPY;  Surgeon: Rogene Houston, MD;  [facility];  Service: Endoscopy;  Laterality: N/A;  730  . ESOPHAGOGASTRODUODENOSCOPY  02/28/2011   Procedure: ESOPHAGOGASTRODUODENOSCOPY (EGD);  Surgeon: Rogene Houston, MD;  Location: [facility];  Service: Endoscopy;  Laterality: N/A;  1200  . ESOPHAGOGASTRODUODENOSCOPY N/A 06/11/2012   Procedure: ESOPHAGOGASTRODUODENOSCOPY (EGD);  Surgeon: Rogene Houston, MD;  Location: [facility];  Service: Endoscopy;  Laterality: N/A;  1200  FYI patient is 400 pounds  . ESOPHAGOGASTRODUODENOSCOPY (EGD) WITH PROPOFOL N/A 04/21/2014   Procedure: ESOPHAGOGASTRODUODENOSCOPY (EGD) WITH PROPOFOL;  Surgeon: Rogene Houston, MD;  Location: [facility] OR[facility];  Service: Endoscopy;  Laterality: N/A;  . HERNIA REPAIR Right    as child; inguinal  . HERNIA REPAIR  sept 2016  . LIVER BIOPSY  2012  . SCIATIC NERVE EXPLORATION    . TONSILLECTOMY     Social History   Occupational History  . Not on file  Tobacco Use  . Smoking status: Never Smoker  . Smokeless tobacco: Current  User    Types: Snuff, Chew  . Tobacco comment: Pt reports that he "dips"  Substance and Sexual Activity  . Alcohol use: No    Alcohol/week: 0.6 oz    Types: 1 Cans of beer per week    Comment: stopped drinking 2-3 yrs ago.   . Drug use: No    Comment: Use to smoke cocaine and marijuana. No IV drug use  . Sexual activity: Yes

## 2017-04-04 NOTE — Telephone Encounter (Signed)
[place] with referral to dietician, this was placed.   [name], MD [facility] 04/04/2017, 10:07 AM

## 2017-04-04 NOTE — Telephone Encounter (Signed)
Pt advised of MD feedback but states he can't exercise due to his hernia and his knee and he can't afford weight watchers because he is on disability. Is there anything else you can suggest? Can he see a dietician or someone that could do a meal plan with him?

## 2017-04-09 ENCOUNTER — Other Ambulatory Visit: Payer: Self-pay | Admitting: Family Medicine

## 2017-04-15 ENCOUNTER — Telehealth: Payer: Self-pay | Admitting: Family Medicine

## 2017-04-15 NOTE — Telephone Encounter (Signed)
Patient and pharmacy aware and verbalizes understanding.

## 2017-04-15 NOTE — Telephone Encounter (Signed)
DC metformin, no need to take it even as needed.   [name], MD [facility] 04/15/2017, 2:32 PM

## 2017-04-15 NOTE — Telephone Encounter (Signed)
Patient states that he is supposed to take metformin every once and awhile. Per office note 01/15/17 metformin was to be d/c. Please advise

## 2017-04-17 ENCOUNTER — Other Ambulatory Visit: Payer: Self-pay | Admitting: *Deleted

## 2017-04-17 MED ORDER — TORSEMIDE 20 MG PO TABS: 20.0000 mg | ORAL_TABLET | Freq: Three times a day (TID) | ORAL | 2 refills | Status: DC

## 2017-04-18 ENCOUNTER — Encounter (INDEPENDENT_AMBULATORY_CARE_PROVIDER_SITE_OTHER): Payer: Self-pay | Admitting: Orthopaedic Surgery

## 2017-04-18 ENCOUNTER — Ambulatory Visit (INDEPENDENT_AMBULATORY_CARE_PROVIDER_SITE_OTHER): Payer: Medicare HMO | Admitting: Orthopaedic Surgery

## 2017-04-18 VITALS — BP 132/89 | HR 79 | Ht 71.0 in | Wt 375.0 lb

## 2017-04-18 DIAGNOSIS — M170 Bilateral primary osteoarthritis of knee: Secondary | ICD-10-CM

## 2017-04-18 DIAGNOSIS — M1712 Unilateral primary osteoarthritis, left knee: Secondary | ICD-10-CM | POA: Diagnosis not present

## 2017-04-18 MED ORDER — HYALURONAN 30 MG/2ML IX SOSY
30.0000 mg | PREFILLED_SYRINGE | INTRA_ARTICULAR | Status: AC | PRN
  Administered 2017-04-18: 30 mg via INTRA_ARTICULAR

## 2017-04-18 MED ORDER — LIDOCAINE HCL 1 % IJ SOLN
0.5000 mL | INTRAMUSCULAR | Status: AC | PRN
  Administered 2017-04-18: .5 mL

## 2017-04-18 NOTE — Progress Notes (Signed)
Office Visit Note   Patient: [name]           Date of Birth: [birth year]           MRN: [id number] Visit Date: 04/18/2017              Requested by: Timmothy Euler, MD [place], [place] [address] PCP: Timmothy Euler, MD   Assessment & Plan: Visit Diagnoses:  1. Bilateral primary osteoarthritis of knee     Plan: Orthovisc injection left knee performed second injection.  Return in 1 week for third injection.  Follow-Up Instructions: No follow-ups on file.   Orders:  Orders Placed This Encounter  Procedures  . Large Joint Inj   No orders of the defined types were placed in this encounter.     Procedures: Large Joint Inj: L knee on 04/18/2017 9:08 AM Indications: joint swelling and pain Details: 22 G 1.5 in needle, anterolateral approach  Arthrogram: No  Medications: 0.5 mL lidocaine 1 %; 30 mg Hyaluronan 30 MG/2ML Outcome: tolerated well, no immediate complications Procedure, treatment alternatives, risks and benefits explained, specific risks discussed. Consent was given by the patient. Immediately prior to procedure a time out was called to verify the correct patient, procedure, equipment, support staff and site/side marked as required. Patient was prepped and draped in the usual sterile fashion.       Clinical Data: No additional findings.   Subjective: Chief Complaint  Patient presents with  . Left Knee - Follow-up    orthovisc #2    HPI patient here for second injection.  Left knee injected with Orthovisc.  Return in 1 week.  Review of Systems no changes.    Objective: Vital Signs: BP 132/89   Pulse 79   Ht 5' 11"  (1.803 m)   Wt (!) 375 lb (170.1 kg)   BMI 52.30 kg/m   Physical Exam no change.   Ortho Exam left knee crepitus  Specialty Comments:  No specialty comments available.  Imaging: No results found.   PMFS History: Patient Active Problem List   Diagnosis Date Noted  . Bilateral primary osteoarthritis  of knee 04/04/2017  . Depression, recurrent ([place]) 07/18/2016  . Preop cardiovascular exam 06/20/2016  . HLD (hyperlipidemia) 10/31/2015  . Leukopenia 07/08/2015  . Thrombocytopenia ([place]) 07/08/2015  . Portal vein thrombosis 06/08/2015  . Back pain 02/19/2013  . Neck pain 02/19/2013  . Genu varus   . Hypogonadism male 05/05/2012  . Umbilical hernia, reducible 12/26/2011  . Asthma 08/28/2011  . Cirrhosis ([place]) 08/14/2011  . Rectal bleeding 04/25/2011  . UNSPECIFIED TACHYCARDIA 02/01/2010  . LEG PAIN 02/28/2009  . T2DM (type 2 diabetes mellitus) ([place]) 05/22/2008  . Gout 05/22/2008  . HYPERTENSION, PULMONARY 05/22/2008  . Atrial fibrillation ([place]) 05/22/2008  . DIASTOLIC HEART FAILURE, CHRONIC 05/22/2008  . OBESITY, MORBID 02/21/2007  . CAD 02/21/2007  . OSA (obstructive sleep apnea) 02/04/2007   Past Medical History:  Diagnosis Date  . Anxiety   . Atrial fibrillation ([place])   . CHF (congestive heart failure) ([place])   . Cirrhosis ([place])    NASH  . Constipation   . COPD (chronic obstructive pulmonary disease) ([place])   . Depression, recurrent ([place]) 07/18/2016  . Diabetes mellitus    type 2  . Genu varus   . GERD (gastroesophageal reflux disease)   . Gout   . Heart murmur   . Leukopenia 07/08/2015  . Neuromuscular disorder (HCC)    neuropathy in hands  and feet  . Psoriasis   . RA (rheumatoid arthritis) ([place])   . Sleep apnea    cpap used- level 10 and greater  . Thrombocytopenia ([place])     Family History  Problem Relation Age of Onset  . Colon cancer Mother   . Cancer Mother        intestine  . Cancer Father        throat  . Cancer Brother        brain  . Cancer Sister   . Heart attack Neg Hx   . Stroke Neg Hx     Past Surgical History:  Procedure Laterality Date  . ANKLE SURGERY     right ankle talor repair  . CHOLECYSTECTOMY  sept 2016  . CHOLECYSTECTOMY    . COLONOSCOPY  05/08/2011   Procedure: COLONOSCOPY;  Surgeon: Rogene Houston, MD;  [facility];  Service: Endoscopy;  Laterality: N/A;  730  . ESOPHAGOGASTRODUODENOSCOPY  02/28/2011   Procedure: ESOPHAGOGASTRODUODENOSCOPY (EGD);  Surgeon: Rogene Houston, MD;  [facility];  Service: Endoscopy;  Laterality: N/A;  1200  . ESOPHAGOGASTRODUODENOSCOPY N/A 06/11/2012   Procedure: ESOPHAGOGASTRODUODENOSCOPY (EGD);  Surgeon: Rogene Houston, MD;  [facility];  Service: Endoscopy;  Laterality: N/A;  1200  FYI patient is 400 pounds  . ESOPHAGOGASTRODUODENOSCOPY (EGD) WITH PROPOFOL N/A 04/21/2014   Procedure: ESOPHAGOGASTRODUODENOSCOPY (EGD) WITH PROPOFOL;  Surgeon: Rogene Houston, MD;  Location: [facility];  Service: Endoscopy;  Laterality: N/A;  . HERNIA REPAIR Right    as child; inguinal  . HERNIA REPAIR  sept 2016  . LIVER BIOPSY  2012  . SCIATIC NERVE EXPLORATION    . TONSILLECTOMY     Social History   Occupational History  . Not on file  Tobacco Use  . Smoking status: Never Smoker  . Smokeless tobacco: Current User    Types: Snuff, Chew  . Tobacco comment: Pt reports that he "dips"  Substance and Sexual Activity  . Alcohol use: No    Alcohol/week: 0.6 oz    Types: 1 Cans of beer per week    Comment: stopped drinking 2-3 yrs ago.   . Drug use: No    Comment: Use to smoke cocaine and marijuana. No IV drug use  . Sexual activity: Yes

## 2017-04-19 ENCOUNTER — Other Ambulatory Visit: Payer: Self-pay | Admitting: Family Medicine

## 2017-04-19 NOTE — Telephone Encounter (Signed)
Last seen 01/25/17  Dr Wendi Snipes

## 2017-04-23 ENCOUNTER — Telehealth: Payer: Self-pay | Admitting: Family Medicine

## 2017-04-23 DIAGNOSIS — Z79899 Other long term (current) drug therapy: Secondary | ICD-10-CM | POA: Diagnosis not present

## 2017-04-23 DIAGNOSIS — I5032 Chronic diastolic (congestive) heart failure: Secondary | ICD-10-CM | POA: Diagnosis not present

## 2017-04-23 DIAGNOSIS — I110 Hypertensive heart disease with heart failure: Secondary | ICD-10-CM | POA: Diagnosis not present

## 2017-04-23 DIAGNOSIS — R42 Dizziness and giddiness: Secondary | ICD-10-CM | POA: Diagnosis not present

## 2017-04-23 DIAGNOSIS — I4891 Unspecified atrial fibrillation: Secondary | ICD-10-CM | POA: Diagnosis not present

## 2017-04-23 DIAGNOSIS — E876 Hypokalemia: Secondary | ICD-10-CM | POA: Diagnosis not present

## 2017-04-23 DIAGNOSIS — I482 Chronic atrial fibrillation: Secondary | ICD-10-CM | POA: Diagnosis not present

## 2017-04-23 DIAGNOSIS — J449 Chronic obstructive pulmonary disease, unspecified: Secondary | ICD-10-CM | POA: Diagnosis not present

## 2017-04-23 DIAGNOSIS — R0602 Shortness of breath: Secondary | ICD-10-CM | POA: Diagnosis not present

## 2017-04-23 DIAGNOSIS — Z7984 Long term (current) use of oral hypoglycemic drugs: Secondary | ICD-10-CM | POA: Diagnosis not present

## 2017-04-23 DIAGNOSIS — E1140 Type 2 diabetes mellitus with diabetic neuropathy, unspecified: Secondary | ICD-10-CM | POA: Diagnosis not present

## 2017-04-23 NOTE — Telephone Encounter (Signed)
Patient aware he can call a dentist office and set up his own apt.

## 2017-04-25 ENCOUNTER — Ambulatory Visit (INDEPENDENT_AMBULATORY_CARE_PROVIDER_SITE_OTHER): Payer: Medicare HMO | Admitting: Orthopaedic Surgery

## 2017-04-25 DIAGNOSIS — M170 Bilateral primary osteoarthritis of knee: Secondary | ICD-10-CM

## 2017-04-25 DIAGNOSIS — M1712 Unilateral primary osteoarthritis, left knee: Secondary | ICD-10-CM

## 2017-04-25 MED ORDER — LIDOCAINE HCL 1 % IJ SOLN
0.5000 mL | INTRAMUSCULAR | Status: AC | PRN
  Administered 2017-04-25: .5 mL

## 2017-04-25 MED ORDER — HYALURONAN 30 MG/2ML IX SOSY
30.0000 mg | PREFILLED_SYRINGE | INTRA_ARTICULAR | Status: AC | PRN
  Administered 2017-04-25: 30 mg via INTRA_ARTICULAR

## 2017-04-25 NOTE — Progress Notes (Signed)
Office Visit Note   Patient: [name]           Date of Birth: [birth year]           MRN: [id number] Visit Date: 04/25/2017              Requested by: Timmothy Euler, MD [place], [place] [id number] PCP: Timmothy Euler, MD   Assessment & Plan: Visit Diagnoses:  1. Bilateral primary osteoarthritis of knee     Plan: 3rd ortho visc injection done left knee after betadine prep. ROV PRN  Follow-Up Instructions: No follow-ups on file.   Orders:  No orders of the defined types were placed in this encounter.  No orders of the defined types were placed in this encounter.     Procedures: Large Joint Inj: L knee on 04/25/2017 9:22 AM Indications: joint swelling and pain Details: 22 G 1.5 in needle, anterolateral approach  Arthrogram: No  Medications: 0.5 mL lidocaine 1 %; 30 mg Hyaluronan 30 MG/2ML Outcome: tolerated well, no immediate complications Procedure, treatment alternatives, risks and benefits explained, specific risks discussed. Consent was given by the patient. Immediately prior to procedure a time out was called to verify the correct patient, procedure, equipment, support staff and site/side marked as required. Patient was prepped and draped in the usual sterile fashion.       Clinical Data: No additional findings.   Subjective: No chief complaint on file.   HPI Pt here for 3rd orthovisc injection.   Review of Systems no change . Pos. For gout.    Objective: Vital Signs: There were no vitals taken for this visit.  Physical Examno change  Ortho Exam left knee limp  Specialty Comments:  No specialty comments available.  Imaging: No results found.   PMFS History: Patient Active Problem List   Diagnosis Date Noted  . Bilateral primary osteoarthritis of knee 04/04/2017  . Depression, recurrent ([place]) 07/18/2016  . Preop cardiovascular exam 06/20/2016  . HLD (hyperlipidemia) 10/31/2015  . Leukopenia 07/08/2015  .  Thrombocytopenia ([place]) 07/08/2015  . Portal vein thrombosis 06/08/2015  . Back pain 02/19/2013  . Neck pain 02/19/2013  . Genu varus   . Hypogonadism male 05/05/2012  . Umbilical hernia, reducible 12/26/2011  . Asthma 08/28/2011  . Cirrhosis ([place]) 08/14/2011  . Rectal bleeding 04/25/2011  . UNSPECIFIED TACHYCARDIA 02/01/2010  . LEG PAIN 02/28/2009  . T2DM (type 2 diabetes mellitus) ([place]) 05/22/2008  . Gout 05/22/2008  . HYPERTENSION, PULMONARY 05/22/2008  . Atrial fibrillation ([place]) 05/22/2008  . DIASTOLIC HEART FAILURE, CHRONIC 05/22/2008  . OBESITY, MORBID 02/21/2007  . CAD 02/21/2007  . OSA (obstructive sleep apnea) 02/04/2007   Past Medical History:  Diagnosis Date  . Anxiety   . Atrial fibrillation ([place])   . CHF (congestive heart failure) ([place])   . Cirrhosis ([place])    NASH  . Constipation   . COPD (chronic obstructive pulmonary disease) ([place])   . Depression, recurrent ([place]) 07/18/2016  . Diabetes mellitus    type 2  . Genu varus   . GERD (gastroesophageal reflux disease)   . Gout   . Heart murmur   . Leukopenia 07/08/2015  . Neuromuscular disorder ([facility])    neuropathy in hands and feet  . Psoriasis   . RA (rheumatoid arthritis) ([place])   . Sleep apnea    cpap used- level 10 and greater  . Thrombocytopenia ([place])     Family History  Problem Relation Age of Onset  .  Colon cancer Mother   . Cancer Mother        intestine  . Cancer Father        throat  . Cancer Brother        brain  . Cancer Sister   . Heart attack Neg Hx   . Stroke Neg Hx     Past Surgical History:  Procedure Laterality Date  . ANKLE SURGERY     right ankle talor repair  . CHOLECYSTECTOMY  sept 2016  . CHOLECYSTECTOMY    . COLONOSCOPY  05/08/2011   Procedure: COLONOSCOPY;  Surgeon: Rogene Houston, MD;  Location: [facility];  Service: Endoscopy;  Laterality: N/A;  730  . ESOPHAGOGASTRODUODENOSCOPY  02/28/2011   Procedure: ESOPHAGOGASTRODUODENOSCOPY (EGD);  Surgeon: Rogene Houston, MD;   Location: [facility];  Service: Endoscopy;  Laterality: N/A;  1200  . ESOPHAGOGASTRODUODENOSCOPY N/A 06/11/2012   Procedure: ESOPHAGOGASTRODUODENOSCOPY (EGD);  Surgeon: Rogene Houston, MD;  Location: [facility];  Service: Endoscopy;  Laterality: N/A;  1200  FYI patient is 400 pounds  . ESOPHAGOGASTRODUODENOSCOPY (EGD) WITH PROPOFOL N/A 04/21/2014   Procedure: ESOPHAGOGASTRODUODENOSCOPY (EGD) WITH PROPOFOL;  Surgeon: Rogene Houston, MD;  Location: [facility];  Service: Endoscopy;  Laterality: N/A;  . HERNIA REPAIR Right    as child; inguinal  . HERNIA REPAIR  sept 2016  . LIVER BIOPSY  2012  . SCIATIC NERVE EXPLORATION    . TONSILLECTOMY     Social History   Occupational History  . Not on file  Tobacco Use  . Smoking status: Never Smoker  . Smokeless tobacco: Current User    Types: Snuff, Chew  . Tobacco comment: Pt reports that he "dips"  Substance and Sexual Activity  . Alcohol use: No    Alcohol/week: 0.6 oz    Types: 1 Cans of beer per week    Comment: stopped drinking 2-3 yrs ago.   . Drug use: No    Comment: Use to smoke cocaine and marijuana. No IV drug use  . Sexual activity: Yes

## 2017-04-29 ENCOUNTER — Other Ambulatory Visit: Payer: Self-pay | Admitting: Family Medicine

## 2017-05-01 ENCOUNTER — Other Ambulatory Visit: Payer: Self-pay | Admitting: Family Medicine

## 2017-05-01 NOTE — Telephone Encounter (Signed)
Last seen 01/25/17  Dr Wendi Snipes

## 2017-05-02 ENCOUNTER — Telehealth: Payer: Self-pay | Admitting: Family Medicine

## 2017-05-02 NOTE — Telephone Encounter (Signed)
Advised patient - NTBS. Patient states he will call back and make apt since he was on the phone with someone else.

## 2017-05-04 DIAGNOSIS — E119 Type 2 diabetes mellitus without complications: Secondary | ICD-10-CM | POA: Diagnosis not present

## 2017-05-04 DIAGNOSIS — J449 Chronic obstructive pulmonary disease, unspecified: Secondary | ICD-10-CM | POA: Diagnosis not present

## 2017-05-04 DIAGNOSIS — I5032 Chronic diastolic (congestive) heart failure: Secondary | ICD-10-CM | POA: Diagnosis not present

## 2017-05-04 DIAGNOSIS — E1121 Type 2 diabetes mellitus with diabetic nephropathy: Secondary | ICD-10-CM | POA: Diagnosis not present

## 2017-05-04 DIAGNOSIS — I110 Hypertensive heart disease with heart failure: Secondary | ICD-10-CM | POA: Diagnosis not present

## 2017-05-04 DIAGNOSIS — M1990 Unspecified osteoarthritis, unspecified site: Secondary | ICD-10-CM | POA: Diagnosis not present

## 2017-05-04 DIAGNOSIS — K7460 Unspecified cirrhosis of liver: Secondary | ICD-10-CM | POA: Diagnosis not present

## 2017-05-04 DIAGNOSIS — Z8249 Family history of ischemic heart disease and other diseases of the circulatory system: Secondary | ICD-10-CM | POA: Diagnosis not present

## 2017-05-04 DIAGNOSIS — G500 Trigeminal neuralgia: Secondary | ICD-10-CM | POA: Diagnosis not present

## 2017-05-04 DIAGNOSIS — I4891 Unspecified atrial fibrillation: Secondary | ICD-10-CM | POA: Diagnosis not present

## 2017-05-06 ENCOUNTER — Telehealth: Payer: Self-pay | Admitting: Family Medicine

## 2017-05-06 NOTE — Telephone Encounter (Signed)
Patient states he was seen at [facility] over the weekend. Patient was prescribed Cardizem 238m and states he is worried about the side effects. States it can cause CHF and liver problems. Wanting to know if he should be on it? Please advise

## 2017-05-06 NOTE — Telephone Encounter (Signed)
[facility]

## 2017-05-06 NOTE — Telephone Encounter (Signed)
If he was prescribed cardizem he should be on it, should be seen for hospital follow up.   [name], MD [facility] 05/06/2017, 11:52 AM

## 2017-05-10 ENCOUNTER — Other Ambulatory Visit: Payer: Self-pay | Admitting: Family Medicine

## 2017-05-10 ENCOUNTER — Encounter: Payer: Self-pay | Admitting: Family Medicine

## 2017-05-10 ENCOUNTER — Ambulatory Visit (INDEPENDENT_AMBULATORY_CARE_PROVIDER_SITE_OTHER): Payer: Medicare HMO | Admitting: Family Medicine

## 2017-05-10 VITALS — BP 115/73 | HR 74 | Temp 97.5°F | Ht 71.0 in | Wt 380.3 lb

## 2017-05-10 DIAGNOSIS — Z23 Encounter for immunization: Secondary | ICD-10-CM

## 2017-05-10 DIAGNOSIS — E876 Hypokalemia: Secondary | ICD-10-CM

## 2017-05-10 DIAGNOSIS — E1165 Type 2 diabetes mellitus with hyperglycemia: Secondary | ICD-10-CM | POA: Diagnosis not present

## 2017-05-10 DIAGNOSIS — G500 Trigeminal neuralgia: Secondary | ICD-10-CM | POA: Diagnosis not present

## 2017-05-10 LAB — BAYER DCA HB A1C WAIVED: HB A1C (BAYER DCA - WAIVED): 5.9 % (ref <7.0)

## 2017-05-10 NOTE — Patient Instructions (Signed)
Great to see you!   Trigeminal Neuralgia Trigeminal neuralgia is a nerve disorder that causes attacks of severe facial pain. The attacks last from a few seconds to several minutes. They can happen for days, weeks, or months and then go away for months or years. Trigeminal neuralgia is also called tic douloureux. What are the causes? This condition is caused by damage to a nerve in the face that is called the trigeminal nerve. An attack can be triggered by:  Talking.  Chewing.  Putting on makeup.  Washing your face.  Shaving your face.  Brushing your teeth.  Touching your face.  What increases the risk? This condition is more likely to develop in:  Women.  People who are 72 years of age or older.  What are the signs or symptoms? The main symptom of this condition is pain in the jaw, lips, eyes, nose, scalp, forehead, and face. The pain may be intense, stabbing, electric, or shock-like. How is this diagnosed? This condition is diagnosed with a physical exam. A CT scan or MRI may be done to rule out other conditions that can cause facial pain. How is this treated? This condition may be treated with:  Avoiding the things that trigger your attacks.  Pain medicine.  Surgery. This may be done in severe cases if other medical treatment does not provide relief.  Follow these instructions at home:  Take over-the-counter and prescription medicines only as told by your health care provider.  If you wish to get pregnant, talk with your health care provider before you start trying to get pregnant.  Avoid the things that trigger your attacks. It may help to: ? Chew on the unaffected side of your mouth. ? Avoid touching your face. ? Avoid blasts of hot or cold air. Contact a health care provider if:  Your pain medicine is not helping.  You develop new, unexplained symptoms, such as: ? Double vision. ? Facial weakness. ? Changes in hearing or balance.  You become  pregnant. Get help right away if:  Your pain is unbearable, and your pain medicine does not help. This information is not intended to replace advice given to you by your health care provider. Make sure you discuss any questions you have with your health care provider. Document Released: 12/23/1999 Document Revised: 08/28/2015 Document Reviewed: 04/19/2014 Elsevier Interactive Patient Education  [employer].

## 2017-05-10 NOTE — Addendum Note (Signed)
Addended by: Nigel Berthold C on: 05/10/2017 11:36 AM   Modules accepted: Orders

## 2017-05-10 NOTE — Progress Notes (Signed)
   HPI  Patient presents today for ER follow-up.  Patient was seen a few days ago in the emergency room and diagnosed with trigeminal neuralgia.  Patient states that he has a painful strip of skin on his head, he was started on Tegretol 200 mg twice daily.  He now complains of some dizziness, headache. He states that the pain is actually improving somewhat.  Diabetes Watching diet as usual. Has stopped metformin.  Patient would like a Prevnar injection    PMH: Smoking status noted ROS: Per HPI  Objective: BP 115/73   Pulse 74   Temp (!) 97.5 F (36.4 C) (Oral)   Ht 5' 11"  (1.803 m)   Wt (!) 380 lb 4.8 oz (172.5 kg)   BMI 53.04 kg/m  Gen: NAD, alert, cooperative with exam HEENT: NCAT, TMs bilaterally normal, oropharynx moist with no concerning findings, nares clear CV: RRR, distant heart sounds Resp: CTABL, no wheezes, non-labored Ext: No edema, warm Neuro: Alert and oriented, No gross deficits  Assessment and plan:  #Trigeminal neuralgia Patient with characteristic pain, started on carbemezapine in the ED.  Has improved some Refer to Neuro   # T2DM A1C today, diet controlled   # hypokalemia Reported from ED Repeat ;labs.   Has not provided for all vaccines and vaccine components, Prevnar given for high risk of pneumonia.   Orders Placed This Encounter  Procedures  . Bayer DCA Hb A1c Waived  . CMP14+EGFR  . CBC with Differential/Platelet  . Ambulatory referral to Neurology    Referral Priority:   Routine    Referral Type:   Consultation    Referral Reason:   Specialty Services Required    Requested Specialty:   Neurology    Number of Visits Requested:   [place], MD [facility] 05/10/2017, 8:47 AM

## 2017-05-11 LAB — CBC WITH DIFFERENTIAL/PLATELET
BASOS: 0 %
Basophils Absolute: 0 10*3/uL (ref 0.0–0.2)
EOS (ABSOLUTE): 0.2 10*3/uL (ref 0.0–0.4)
Eos: 8 %
HEMOGLOBIN: 13.3 g/dL (ref 13.0–17.7)
Hematocrit: 39.5 % (ref 37.5–51.0)
IMMATURE GRANS (ABS): 0 10*3/uL (ref 0.0–0.1)
IMMATURE GRANULOCYTES: 0 %
Lymphocytes Absolute: 0.6 10*3/uL — ABNORMAL LOW (ref 0.7–3.1)
Lymphs: 28 %
MCH: 27.8 pg (ref 26.6–33.0)
MCHC: 33.7 g/dL (ref 31.5–35.7)
MCV: 83 fL (ref 79–97)
Monocytes Absolute: 0.2 10*3/uL (ref 0.1–0.9)
Monocytes: 7 %
NEUTROS ABS: 1.3 10*3/uL — AB (ref 1.4–7.0)
Neutrophils: 57 %
PLATELETS: 40 10*3/uL — AB (ref 150–379)
RBC: 4.78 x10E6/uL (ref 4.14–5.80)
RDW: 15.5 % — ABNORMAL HIGH (ref 12.3–15.4)
WBC: 2.3 10*3/uL — CL (ref 3.4–10.8)

## 2017-05-11 LAB — CMP14+EGFR
A/G RATIO: 1.6 (ref 1.2–2.2)
ALT: 29 IU/L (ref 0–44)
AST: 40 IU/L (ref 0–40)
Albumin: 4.2 g/dL (ref 3.5–5.5)
Alkaline Phosphatase: 85 IU/L (ref 39–117)
BUN/Creatinine Ratio: 11 (ref 9–20)
BUN: 9 mg/dL (ref 6–24)
Bilirubin Total: 0.4 mg/dL (ref 0.0–1.2)
CALCIUM: 8.7 mg/dL (ref 8.7–10.2)
CO2: 24 mmol/L (ref 20–29)
Chloride: 95 mmol/L — ABNORMAL LOW (ref 96–106)
Creatinine, Ser: 0.85 mg/dL (ref 0.76–1.27)
GFR, EST AFRICAN AMERICAN: 115 mL/min/{1.73_m2} (ref >59)
GFR, EST NON AFRICAN AMERICAN: 99 mL/min/{1.73_m2} (ref >59)
GLOBULIN, TOTAL: 2.6 g/dL (ref 1.5–4.5)
Glucose: 138 mg/dL — ABNORMAL HIGH (ref 65–99)
Potassium: 3.2 mmol/L — ABNORMAL LOW (ref 3.5–5.2)
SODIUM: 134 mmol/L (ref 134–144)
TOTAL PROTEIN: 6.8 g/dL (ref 6.0–8.5)

## 2017-05-14 DIAGNOSIS — J019 Acute sinusitis, unspecified: Secondary | ICD-10-CM | POA: Diagnosis not present

## 2017-05-14 DIAGNOSIS — I110 Hypertensive heart disease with heart failure: Secondary | ICD-10-CM | POA: Diagnosis not present

## 2017-05-14 DIAGNOSIS — K219 Gastro-esophageal reflux disease without esophagitis: Secondary | ICD-10-CM | POA: Diagnosis not present

## 2017-05-14 DIAGNOSIS — I5032 Chronic diastolic (congestive) heart failure: Secondary | ICD-10-CM | POA: Diagnosis not present

## 2017-05-14 DIAGNOSIS — R05 Cough: Secondary | ICD-10-CM | POA: Diagnosis not present

## 2017-05-14 DIAGNOSIS — M1990 Unspecified osteoarthritis, unspecified site: Secondary | ICD-10-CM | POA: Diagnosis not present

## 2017-05-14 DIAGNOSIS — D72819 Decreased white blood cell count, unspecified: Secondary | ICD-10-CM | POA: Diagnosis not present

## 2017-05-14 DIAGNOSIS — J329 Chronic sinusitis, unspecified: Secondary | ICD-10-CM | POA: Diagnosis not present

## 2017-05-14 DIAGNOSIS — H9201 Otalgia, right ear: Secondary | ICD-10-CM | POA: Diagnosis not present

## 2017-05-14 DIAGNOSIS — R0602 Shortness of breath: Secondary | ICD-10-CM | POA: Diagnosis not present

## 2017-05-14 DIAGNOSIS — K7460 Unspecified cirrhosis of liver: Secondary | ICD-10-CM | POA: Diagnosis not present

## 2017-05-14 DIAGNOSIS — D696 Thrombocytopenia, unspecified: Secondary | ICD-10-CM | POA: Diagnosis not present

## 2017-05-14 DIAGNOSIS — R9431 Abnormal electrocardiogram [ECG] [EKG]: Secondary | ICD-10-CM | POA: Diagnosis not present

## 2017-05-14 DIAGNOSIS — E1140 Type 2 diabetes mellitus with diabetic neuropathy, unspecified: Secondary | ICD-10-CM | POA: Diagnosis not present

## 2017-05-22 ENCOUNTER — Other Ambulatory Visit: Payer: Self-pay | Admitting: Family Medicine

## 2017-05-23 ENCOUNTER — Telehealth: Payer: Self-pay | Admitting: Family Medicine

## 2017-05-23 NOTE — Telephone Encounter (Signed)
PT has no phone number to call back on, pt states that the referral for neruo doctor, be in [place]. Please mail letter with apt time and date, prefers around 5th of month that is when he gets paid, and prefers morning apts.

## 2017-05-23 NOTE — Telephone Encounter (Signed)
Referral placed for Neurology   No Neurologist in [place] to Dr Merlene Laughter in [place]  Not scheduled yet

## 2017-05-25 ENCOUNTER — Telehealth: Payer: Self-pay | Admitting: Family Medicine

## 2017-05-27 MED ORDER — POTASSIUM CHLORIDE CRYS ER 20 MEQ PO TBCR: 20.0000 meq | EXTENDED_RELEASE_TABLET | Freq: Two times a day (BID) | ORAL | 2 refills | Status: DC

## 2017-05-27 NOTE — Telephone Encounter (Signed)
Refill sent to pharmacy.   

## 2017-05-29 ENCOUNTER — Other Ambulatory Visit: Payer: Self-pay | Admitting: Family Medicine

## 2017-05-29 ENCOUNTER — Other Ambulatory Visit: Payer: Self-pay | Admitting: Pediatrics

## 2017-05-29 NOTE — Telephone Encounter (Signed)
Last seen 05/10/17

## 2017-06-07 ENCOUNTER — Other Ambulatory Visit (HOSPITAL_COMMUNITY): Payer: Medicare HMO

## 2017-06-14 ENCOUNTER — Ambulatory Visit (HOSPITAL_COMMUNITY): Payer: Medicare HMO | Admitting: Hematology

## 2017-06-17 ENCOUNTER — Telehealth: Payer: Self-pay | Admitting: Family Medicine

## 2017-06-17 MED ORDER — TORSEMIDE 20 MG PO TABS: 20.0000 mg | ORAL_TABLET | Freq: Three times a day (TID) | ORAL | 0 refills | Status: DC

## 2017-06-17 NOTE — Telephone Encounter (Signed)
Pt is needing refills on torsemide (DEMADEX) 20 MG tablet androgel pump said normally gets 2 bottles at a time, please put refill as needed. Otherwise the the pharmacy will have it on an automatic monthly refill.   [facility] is Pharmacy  Please contact once sent so he can contact the pharmacy to have it delivered to his home

## 2017-06-18 MED ORDER — TORSEMIDE 20 MG PO TABS: 20.0000 mg | ORAL_TABLET | Freq: Three times a day (TID) | ORAL | 3 refills | Status: DC

## 2017-06-18 NOTE — Telephone Encounter (Signed)
Pt requesting refill on testosterone and demadex, demadex refilled.   Decline testosterone as I have not been treating him for this and do not have evidence to support treatment. Will recommend seeing endo for eval for hypogonadsim.   [name] Apple, MD [facility] 06/18/2017, 7:44 AM

## 2017-06-18 NOTE — Telephone Encounter (Signed)
Patient aware and verbalizes understanding. 

## 2017-06-19 ENCOUNTER — Telehealth (INDEPENDENT_AMBULATORY_CARE_PROVIDER_SITE_OTHER): Payer: Self-pay | Admitting: Radiology

## 2017-06-19 ENCOUNTER — Ambulatory Visit: Payer: Medicare HMO | Admitting: Family Medicine

## 2017-06-19 MED ORDER — VOLTAREN 1 % TD GEL: TRANSDERMAL | 2 refills | Status: DC

## 2017-06-19 NOTE — Telephone Encounter (Signed)
Sent to pharmacy 

## 2017-06-19 NOTE — Telephone Encounter (Signed)
Received paperwork from [facility]. Voltaren Gel requires prior auth.  I entered [place] info into Cover My Meds. Awaiting approval.

## 2017-06-19 NOTE — Telephone Encounter (Signed)
Received fax from [place]. Patient requests refill on Voltaren Gel.   Please advise.

## 2017-06-19 NOTE — Telephone Encounter (Signed)
Ok thanks 

## 2017-06-19 NOTE — Addendum Note (Signed)
Addended by: Meyer Cory on: 06/19/2017 12:15 PM   Modules accepted: Orders

## 2017-06-21 ENCOUNTER — Ambulatory Visit (INDEPENDENT_AMBULATORY_CARE_PROVIDER_SITE_OTHER): Payer: Medicare HMO

## 2017-06-21 ENCOUNTER — Other Ambulatory Visit: Payer: Self-pay | Admitting: Family Medicine

## 2017-06-21 ENCOUNTER — Ambulatory Visit (INDEPENDENT_AMBULATORY_CARE_PROVIDER_SITE_OTHER): Payer: Medicare HMO | Admitting: Family Medicine

## 2017-06-21 ENCOUNTER — Encounter: Payer: Self-pay | Admitting: Family Medicine

## 2017-06-21 VITALS — BP 118/78 | HR 69 | Temp 97.0°F | Ht 71.0 in | Wt 386.0 lb

## 2017-06-21 DIAGNOSIS — I5032 Chronic diastolic (congestive) heart failure: Secondary | ICD-10-CM

## 2017-06-21 DIAGNOSIS — M7989 Other specified soft tissue disorders: Secondary | ICD-10-CM

## 2017-06-21 NOTE — Progress Notes (Signed)
   HPI  Patient presents today with left leg swelling.  Patient also states that he recently mixed up his beer with a friend who told him later that he had hepatitis C.  Patient states that he is using metolazone as prescribed, also torsemide.  He states that his diuresis is not as good as it used to be.  Patient describes left leg swelling that is just below his left medial calf that came up about 3 or 4 days ago.  He denies any pain, erythema, or drastic changes on the right side as well. He does have a history of hepatic vein thrombosis.   PMH: Smoking status noted ROS: Per HPI  Objective: BP 118/78 (BP Location: Right Arm)   Pulse 69   Temp (!) 97 F (36.1 C) (Oral)   Ht 5' 11"  (1.803 m)   Wt (!) 386 lb (175.1 kg)   BMI 53.84 kg/m  Gen: NAD, alert, cooperative with exam HEENT: NCAT CV: RRR, good S1/S2, no murmur Resp: CTABL, no wheezes, non-labored Ext: 1+ pitting edema bilateral lower extremities, he has a prominence over the left medial soleus on the left that is nontender to palpation without erythema, 52 cm calf circumference on the left versus 48 on the right Neuro: Alert and oriented, No gross deficits  Right forearm has a approximately 2 cm soft tissue nodule that is just deep to the skin palpated without tenderness, it is mobile but not quite as mobile as a typical cyst.  Assessment and plan:  #Left leg swelling Patient has calf circumference discrepancy with prominence in the left lower extremity with history of clot Sent to [facility] rocking him for stat venous duplex to rule out clot If he is positive for DVT I would recommend ER evaluation with his complicated anticoagulation history.   #Tissue nodule Soft nontender mobile lesion that feels to be in the muscle on the right forearm, will go ahead and do an x-ray to rule out any bony involvement Consider ultrasound if not clearly characterized  #Diastolic CHF No orthopnea, breathing well overall Patient reports  decrease in diuresis, his kidney function was very good last checked last month, he is also on frequent doses of Demadex plus metolazone daily Complex situation, recommended follow-up with cardiology.      Orders Placed This Encounter  Procedures  . DG Elbow 2 Views Right    Standing Status:   Future    Number of Occurrences:   1    Standing Expiration Date:   08/21/2018    Order Specific Question:   Reason for Exam (SYMPTOM  OR DIAGNOSIS REQUIRED)    Answer:   sub cutaneous nodules deep to skin on R prox forearm.    Order Specific Question:   Preferred imaging location?    Answer:   Internal     [name], MD [facility] 06/21/2017, 8:33 AM

## 2017-06-21 NOTE — Patient Instructions (Signed)
Great to see you!  We will begin work-up on your right forearm where the x-ray, we will consider an ultrasound after that  I think it is a good time to go ahead and follow-up with Dr. Percival Spanish for your heart failure.  We will work on the ultrasound of your leg today.

## 2017-06-24 ENCOUNTER — Other Ambulatory Visit: Payer: Self-pay | Admitting: *Deleted

## 2017-06-24 ENCOUNTER — Telehealth (INDEPENDENT_AMBULATORY_CARE_PROVIDER_SITE_OTHER): Payer: Self-pay | Admitting: Orthopaedic Surgery

## 2017-06-24 DIAGNOSIS — M7989 Other specified soft tissue disorders: Secondary | ICD-10-CM

## 2017-06-24 NOTE — Telephone Encounter (Signed)
Patient called wanting to speak with you about some additional issues he's having with his knees and back. If you could give him a call back at [phone]

## 2017-06-24 NOTE — Progress Notes (Unsigned)
Patient did not have his LLE venous doppler last week.  Spoke with patient and he still has swelling but no increase in swelling or development of other symptoms. Left message for central scheduling at [facility] to call back to schedule tomorrow morning per patient's request.

## 2017-06-26 NOTE — Telephone Encounter (Signed)
Dr. Lorin Mercy noticed a hernia in the front which affects his back. He is walking better on the knee, but his back muscles are really being affected by the hernia. He would like to get a back brace from the [place] office. Patient also states that since he has lost all of the cartilage in his left knee, his left leg is shorter which is making his gait off. He would like to know if you would prescribe an orthotic for his left which will help him to stand and walk straight and not waddle. Lastly, he understands that you do not think a knee brace (with metal stays) will help him, but he thinks it will. He would like to have the knee brace. He understands that it may not help him physically, but knows that it will mentally which will help in the long run.  I did explain to patient that he may need to make a return office visit to discuss these problems.   Please advise.

## 2017-06-27 ENCOUNTER — Encounter: Payer: Self-pay | Admitting: Family

## 2017-06-27 ENCOUNTER — Telehealth: Payer: Self-pay | Admitting: Family Medicine

## 2017-06-27 DIAGNOSIS — R600 Localized edema: Secondary | ICD-10-CM | POA: Diagnosis not present

## 2017-06-27 DIAGNOSIS — M7989 Other specified soft tissue disorders: Secondary | ICD-10-CM | POA: Diagnosis not present

## 2017-06-27 NOTE — Telephone Encounter (Signed)
appreciate FYI- Pt seeing Dr. Ladona Horns July 1st.   [name], MD [facility] 06/27/2017, 4:59 PM

## 2017-06-27 NOTE — Telephone Encounter (Signed)
Ok for lumbar corset, and knee brace. ucall thanks

## 2017-06-29 ENCOUNTER — Other Ambulatory Visit: Payer: Self-pay | Admitting: Cardiology

## 2017-07-01 NOTE — Telephone Encounter (Signed)
Rx request sent to pharmacy.  

## 2017-07-02 ENCOUNTER — Telehealth (INDEPENDENT_AMBULATORY_CARE_PROVIDER_SITE_OTHER): Payer: Self-pay | Admitting: Orthopaedic Surgery

## 2017-07-02 ENCOUNTER — Other Ambulatory Visit: Payer: Self-pay | Admitting: Orthopaedic Surgery

## 2017-07-02 NOTE — Telephone Encounter (Signed)
Patient called needing Rx refilled allopurinol. Patient said he wants to still take 139m. Patient said he is not ready to take the 3072m   Patient also said the pharmacy is faxing another Rx to be refilled today.  The number to contact patient is [phone]

## 2017-07-02 NOTE — Telephone Encounter (Signed)
Ok for refill? 

## 2017-07-02 NOTE — Telephone Encounter (Signed)
Please advise 

## 2017-07-03 NOTE — Telephone Encounter (Signed)
I called discussed. He can get knee [facility] from [facility]. Primary problem is gout and needs to get to 313m daily to get uric acid down and joints will get better. He can get back brace [facility] . I called. FYI thanks

## 2017-07-03 NOTE — Telephone Encounter (Signed)
I called patient to have him pick up items in [place]. A lumbar corset that is larger was ordered to come in the [place] office. Patient is concerned that the lumbar corset will bother his hernia. I told him that we would try it on him to see if that was something he could handle.  Patient also states that he cannot do the hinged knee brace that we have and is closed. He states that it tears his skin off. He would like for you to write for a knee brace that is open so it will not be so hot. He would like one that is metal all of the way down both sides. He states that he showed you a picture of it when he was in the office. I explained to him that these type braces are not usually given for knee pain and that his insurance would most likely not approve. He states that he was approved for it when he went to the [facility] clinic and he was only going to have to pay $100 out of pocket. He cannot get to [facility], so he cannot go back there.  He also wants Dr. Lorin Mercy to know that his ankle seems to be getting worse. He knows that you all discussed spurring of the ankle, but he is hurting more laterally.  He has an ASO which helps some, but the strap is torn a little.   He would like a call back regarding getting the knee brace he wants and what you think about his ankle.  Please advise.

## 2017-07-03 NOTE — Telephone Encounter (Signed)
noted 

## 2017-07-04 ENCOUNTER — Telehealth: Payer: Self-pay | Admitting: Family Medicine

## 2017-07-04 DIAGNOSIS — R69 Illness, unspecified: Secondary | ICD-10-CM | POA: Diagnosis not present

## 2017-07-04 NOTE — Telephone Encounter (Signed)
Patient given number and also aware of [place] results.

## 2017-07-04 NOTE — Telephone Encounter (Signed)
LMTCB

## 2017-07-04 NOTE — Telephone Encounter (Signed)
Pt states that Dr Wendi Snipes personally wrote down phone number to neuro doctor but he lost the number. He is calling back for it.

## 2017-07-16 ENCOUNTER — Telehealth: Payer: Self-pay | Admitting: Family Medicine

## 2017-07-16 NOTE — Telephone Encounter (Signed)
No that is quickest

## 2017-07-16 NOTE — Telephone Encounter (Signed)
Pt notified of recommendation Verbalizes understanding 

## 2017-07-16 NOTE — Telephone Encounter (Signed)
Pt requesting MRI or CT for dizziness  Recently referred to neuro for trigeminal neuralgia.   I recommend that he be seen to eval for need of CT/MRI.   Referrals coordinator has worked on this and Aug appt ( about 1 month) is as soon as neuro can do.   [name], MD [facility] 07/16/2017, 5:01 PM

## 2017-07-23 ENCOUNTER — Telehealth: Payer: Self-pay | Admitting: Cardiology

## 2017-07-23 NOTE — Telephone Encounter (Signed)
Follow up   Please call patient to schedule. Patient wanting reason for appointment.

## 2017-07-23 NOTE — Telephone Encounter (Signed)
LVM for pt to call back to sched f/u in [place]

## 2017-07-28 ENCOUNTER — Other Ambulatory Visit: Payer: Self-pay | Admitting: Cardiology

## 2017-07-29 DIAGNOSIS — R2231 Localized swelling, mass and lump, right upper limb: Secondary | ICD-10-CM | POA: Diagnosis not present

## 2017-07-31 DIAGNOSIS — R2231 Localized swelling, mass and lump, right upper limb: Secondary | ICD-10-CM | POA: Insufficient documentation

## 2017-08-01 ENCOUNTER — Telehealth (INDEPENDENT_AMBULATORY_CARE_PROVIDER_SITE_OTHER): Payer: Self-pay | Admitting: Radiology

## 2017-08-01 NOTE — Telephone Encounter (Signed)
Patient called requesting abdominal hernia belt that he needs. Per Dr. Lorin Mercy, he will have to call PCP in regards to belt.  [phone]

## 2017-08-05 ENCOUNTER — Telehealth: Payer: Self-pay | Admitting: Family Medicine

## 2017-08-05 NOTE — Telephone Encounter (Signed)
Pt had questions about his Protonix, Propanolol and zetia. Pt wanted to stop taking them due to cost. Advised pt he needs to stay on the Propanolol and the zetia but if he doesn't want to take the Protonix he could stop but it's for GERD/Acid reflux. Offered appt for pt to see provider and discuss medications but pt declined.

## 2017-08-06 NOTE — Telephone Encounter (Signed)
I have called and LM for patient advising per Dr Lorin Mercy.

## 2017-08-08 ENCOUNTER — Telehealth: Payer: Self-pay | Admitting: Pulmonary Disease

## 2017-08-08 ENCOUNTER — Other Ambulatory Visit: Payer: Self-pay | Admitting: Orthopaedic Surgery

## 2017-08-08 ENCOUNTER — Telehealth (INDEPENDENT_AMBULATORY_CARE_PROVIDER_SITE_OTHER): Payer: Self-pay | Admitting: Orthopaedic Surgery

## 2017-08-08 ENCOUNTER — Telehealth: Payer: Self-pay | Admitting: Family Medicine

## 2017-08-08 NOTE — Telephone Encounter (Signed)
Returned call to patient left message to call back. 

## 2017-08-08 NOTE — Telephone Encounter (Signed)
Patient called and I advised him of the message from Dr. Lorin Mercy.  Patient stated that Dr. Lorin Mercy had told him that he would order the Hernia Belt for him.  CB#[phone].  Thank you.

## 2017-08-08 NOTE — Telephone Encounter (Signed)
Please advise 

## 2017-08-08 NOTE — Telephone Encounter (Signed)
LMTCB pt ntbs with Dr Dettinger to discuss xanax, not sure Dr Wendi Snipes was writing for his xanax.

## 2017-08-08 NOTE — Telephone Encounter (Signed)
Pt called back and I advised him since he gets his xanax from another provider that isn't at our office we couldn't rx that. Pt voiced understanding and advised to call the providers office who prescribes.

## 2017-08-08 NOTE — Telephone Encounter (Signed)
Spoke with pt. States that he needs a refill on Xanax. Advised him that our office has not seen him in almost 5 years and we have never prescribed this for him. States that he will call his PCP. Nothing further was needed.

## 2017-08-12 DIAGNOSIS — M545 Low back pain: Secondary | ICD-10-CM | POA: Diagnosis not present

## 2017-08-12 DIAGNOSIS — J449 Chronic obstructive pulmonary disease, unspecified: Secondary | ICD-10-CM | POA: Diagnosis not present

## 2017-08-12 DIAGNOSIS — T560X1A Toxic effect of lead and its compounds, accidental (unintentional), initial encounter: Secondary | ICD-10-CM | POA: Diagnosis not present

## 2017-08-12 DIAGNOSIS — I480 Paroxysmal atrial fibrillation: Secondary | ICD-10-CM | POA: Diagnosis not present

## 2017-08-12 DIAGNOSIS — M549 Dorsalgia, unspecified: Secondary | ICD-10-CM | POA: Diagnosis not present

## 2017-08-12 DIAGNOSIS — R609 Edema, unspecified: Secondary | ICD-10-CM | POA: Diagnosis not present

## 2017-08-12 DIAGNOSIS — M7989 Other specified soft tissue disorders: Secondary | ICD-10-CM | POA: Diagnosis not present

## 2017-08-12 DIAGNOSIS — M25531 Pain in right wrist: Secondary | ICD-10-CM | POA: Diagnosis not present

## 2017-08-12 DIAGNOSIS — M47816 Spondylosis without myelopathy or radiculopathy, lumbar region: Secondary | ICD-10-CM | POA: Diagnosis not present

## 2017-08-12 DIAGNOSIS — M1A1710 Lead-induced chronic gout, right ankle and foot, without tophus (tophi): Secondary | ICD-10-CM | POA: Diagnosis not present

## 2017-08-12 DIAGNOSIS — E1140 Type 2 diabetes mellitus with diabetic neuropathy, unspecified: Secondary | ICD-10-CM | POA: Diagnosis not present

## 2017-08-12 DIAGNOSIS — M19071 Primary osteoarthritis, right ankle and foot: Secondary | ICD-10-CM | POA: Diagnosis not present

## 2017-08-12 DIAGNOSIS — M25571 Pain in right ankle and joints of right foot: Secondary | ICD-10-CM | POA: Diagnosis not present

## 2017-08-12 DIAGNOSIS — I10 Essential (primary) hypertension: Secondary | ICD-10-CM | POA: Diagnosis not present

## 2017-08-12 DIAGNOSIS — M109 Gout, unspecified: Secondary | ICD-10-CM | POA: Diagnosis not present

## 2017-08-12 DIAGNOSIS — G8929 Other chronic pain: Secondary | ICD-10-CM | POA: Diagnosis not present

## 2017-08-12 NOTE — Telephone Encounter (Signed)
Unable to reach patient. Will wait to discuss if he returns call.

## 2017-08-12 NOTE — Telephone Encounter (Signed)
Attempted to reach patient. Dr. Lorin Mercy was going to have patient fitted for lumbar corset, however, since he has this problem with his hernia, felt like that should be addressed by his PCP so that they could get him the correct belt that he needs. Dr. Lorin Mercy does not treat hernias and states he cannot order the belt.

## 2017-08-13 DIAGNOSIS — R42 Dizziness and giddiness: Secondary | ICD-10-CM | POA: Diagnosis not present

## 2017-08-13 DIAGNOSIS — M130 Polyarthritis, unspecified: Secondary | ICD-10-CM | POA: Diagnosis not present

## 2017-08-13 DIAGNOSIS — G5712 Meralgia paresthetica, left lower limb: Secondary | ICD-10-CM | POA: Diagnosis not present

## 2017-08-15 ENCOUNTER — Other Ambulatory Visit: Payer: Self-pay

## 2017-08-15 ENCOUNTER — Other Ambulatory Visit (INDEPENDENT_AMBULATORY_CARE_PROVIDER_SITE_OTHER): Payer: Self-pay | Admitting: Orthopaedic Surgery

## 2017-08-15 DIAGNOSIS — I110 Hypertensive heart disease with heart failure: Secondary | ICD-10-CM | POA: Diagnosis not present

## 2017-08-15 DIAGNOSIS — K219 Gastro-esophageal reflux disease without esophagitis: Secondary | ICD-10-CM | POA: Diagnosis not present

## 2017-08-15 DIAGNOSIS — I4891 Unspecified atrial fibrillation: Secondary | ICD-10-CM | POA: Diagnosis not present

## 2017-08-15 DIAGNOSIS — M7989 Other specified soft tissue disorders: Secondary | ICD-10-CM

## 2017-08-15 DIAGNOSIS — E1121 Type 2 diabetes mellitus with diabetic nephropathy: Secondary | ICD-10-CM | POA: Diagnosis not present

## 2017-08-15 DIAGNOSIS — M1A09X0 Idiopathic chronic gout, multiple sites, without tophus (tophi): Secondary | ICD-10-CM | POA: Diagnosis not present

## 2017-08-15 DIAGNOSIS — E669 Obesity, unspecified: Secondary | ICD-10-CM | POA: Diagnosis not present

## 2017-08-15 DIAGNOSIS — M1990 Unspecified osteoarthritis, unspecified site: Secondary | ICD-10-CM | POA: Diagnosis not present

## 2017-08-15 DIAGNOSIS — J449 Chronic obstructive pulmonary disease, unspecified: Secondary | ICD-10-CM | POA: Diagnosis not present

## 2017-08-15 DIAGNOSIS — K7460 Unspecified cirrhosis of liver: Secondary | ICD-10-CM | POA: Diagnosis not present

## 2017-08-15 DIAGNOSIS — I509 Heart failure, unspecified: Secondary | ICD-10-CM | POA: Diagnosis not present

## 2017-08-15 NOTE — Telephone Encounter (Signed)
Ok for refill? 

## 2017-08-15 NOTE — Telephone Encounter (Signed)
yes

## 2017-08-19 ENCOUNTER — Other Ambulatory Visit (INDEPENDENT_AMBULATORY_CARE_PROVIDER_SITE_OTHER): Payer: Self-pay | Admitting: Orthopaedic Surgery

## 2017-08-19 NOTE — Telephone Encounter (Signed)
Can you please advise since Dr. Lorin Mercy is out of the office?

## 2017-08-26 DIAGNOSIS — R2231 Localized swelling, mass and lump, right upper limb: Secondary | ICD-10-CM | POA: Diagnosis not present

## 2017-08-27 IMAGING — US US ABDOMEN COMPLETE
1 series · 14 of 25 positions shown · non-contrast
Comparison: CT 10/02/2014 .

CLINICAL DATA: Cirrhosis.

EXAM:
ABDOMEN ULTRASOUND COMPLETE

[Series 1: us abdomen complete · 0.28mm/px · 14 of 73 slices shown]
[im 1/73]
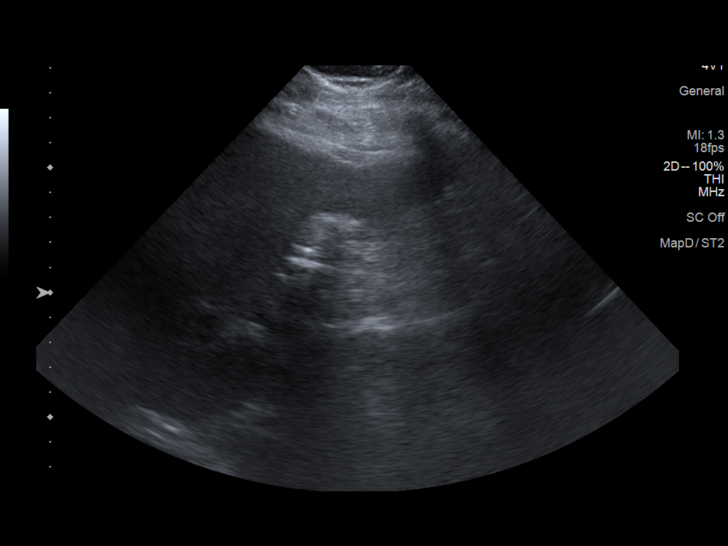
[im 7/73]
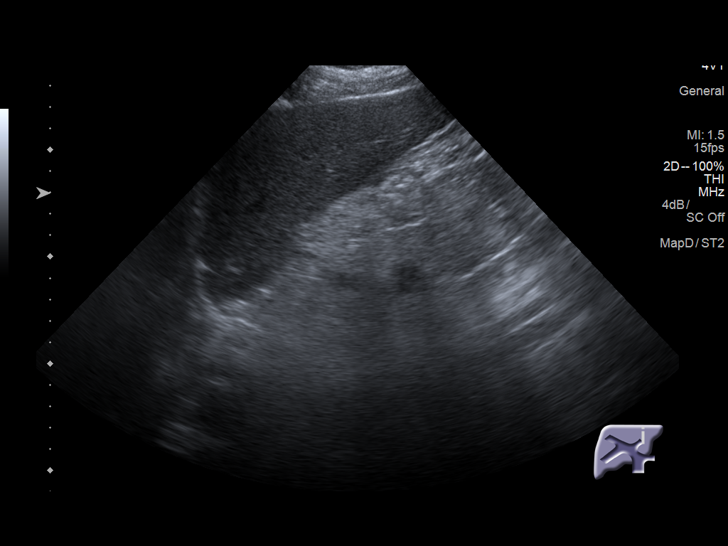
[im 13/73]
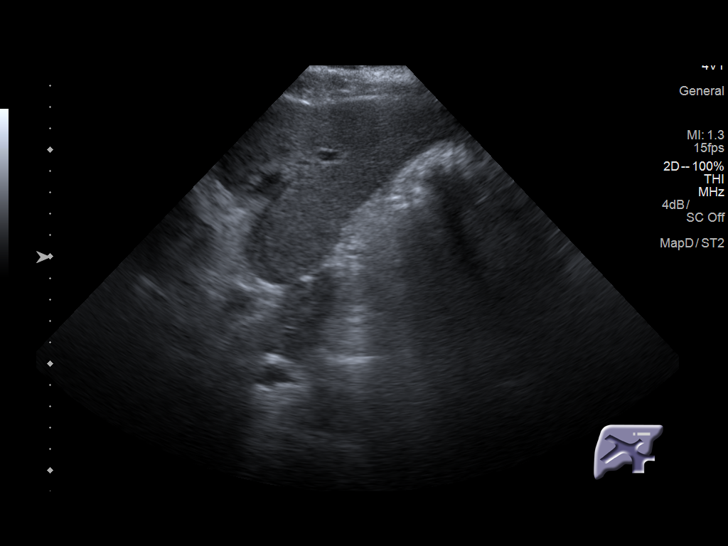
[im 19/73]
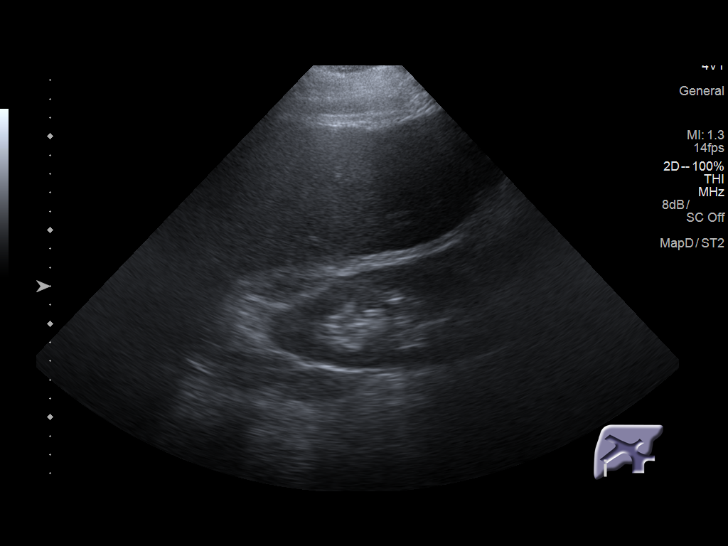
[im 25/73]
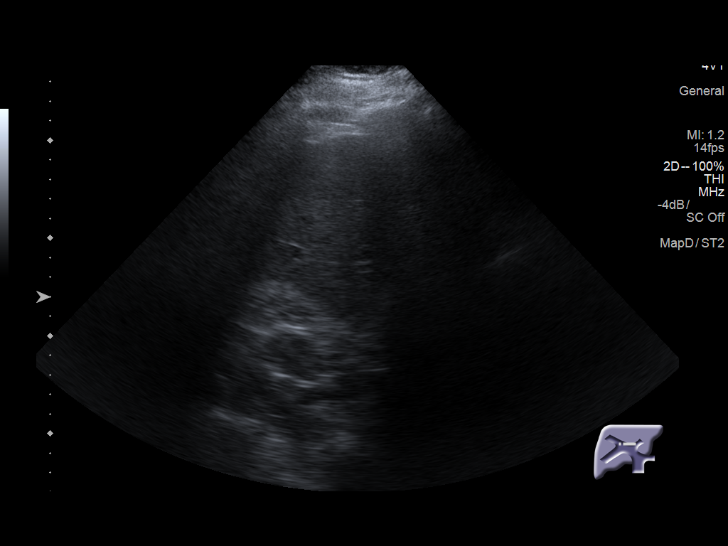
[im 28/73]
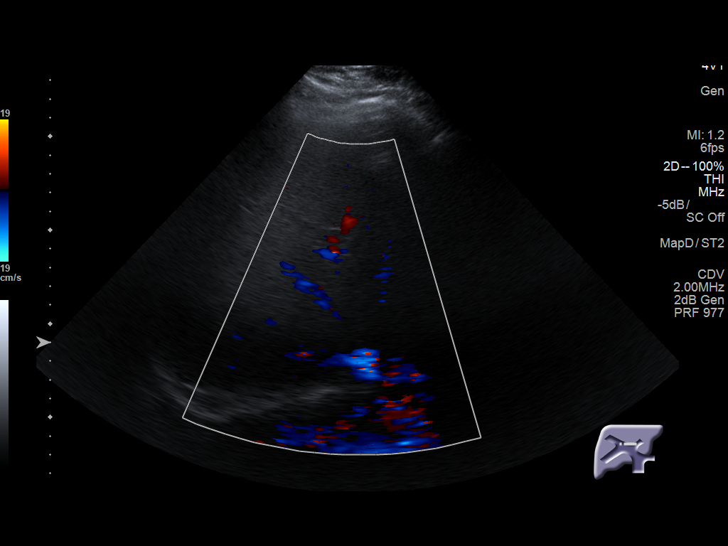
[im 34/73]
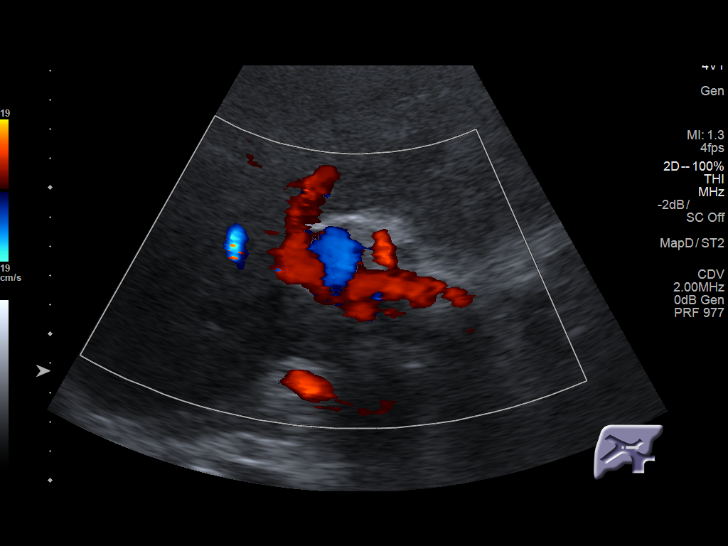
[im 40/73]
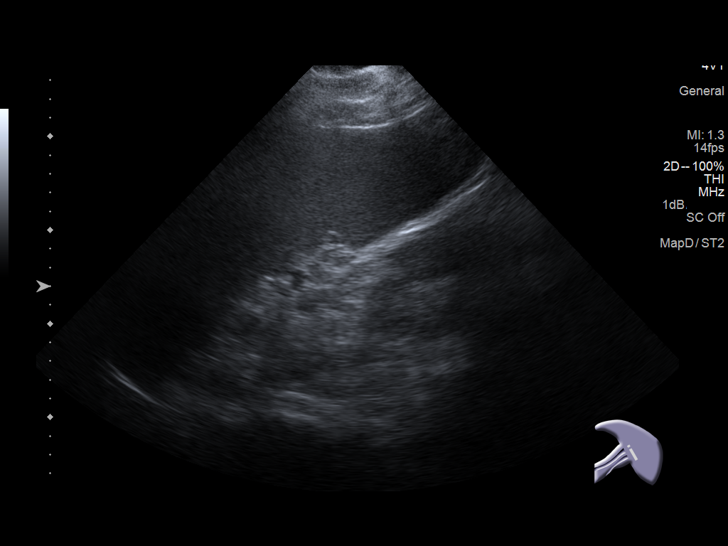
[im 46/73]
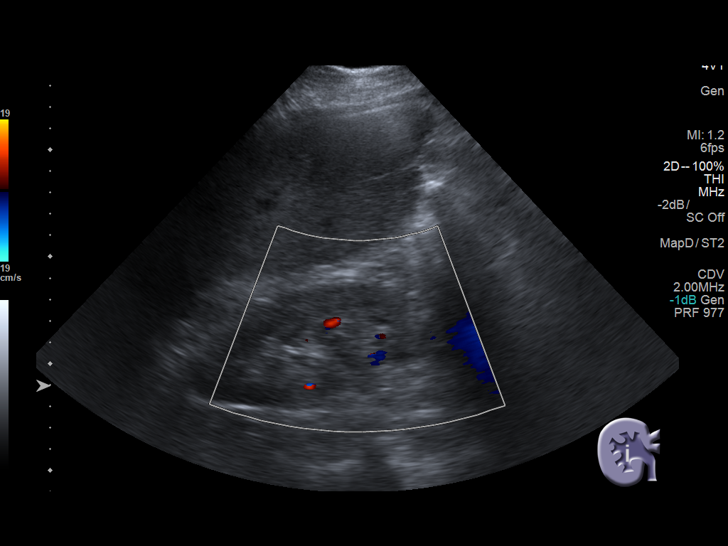
[im 49/73]
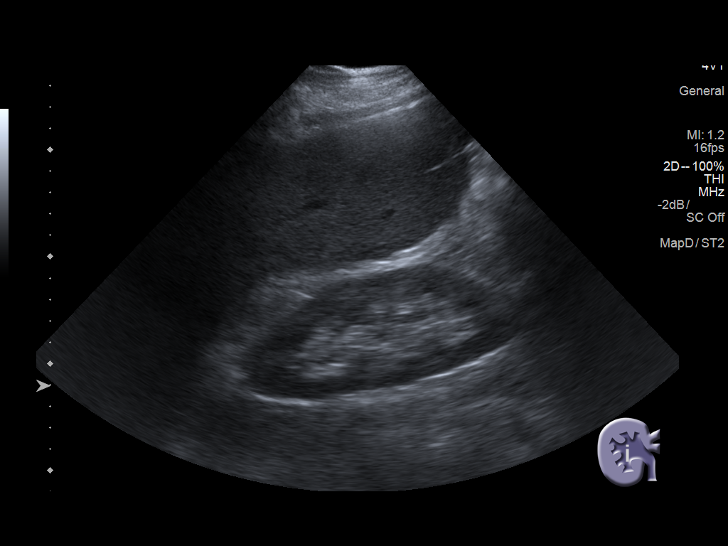
[im 55/73]
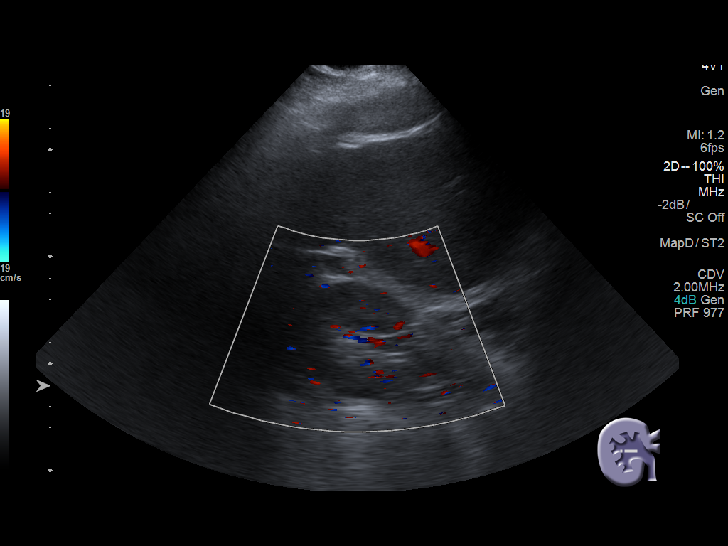
[im 61/73]
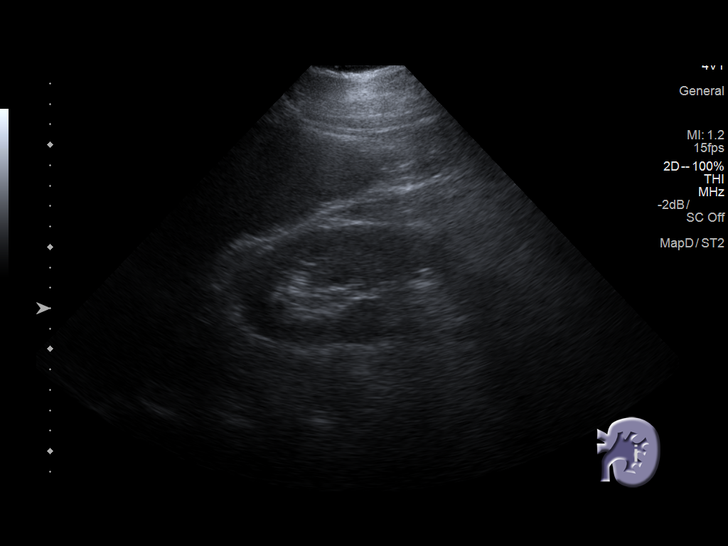
[im 67/73]
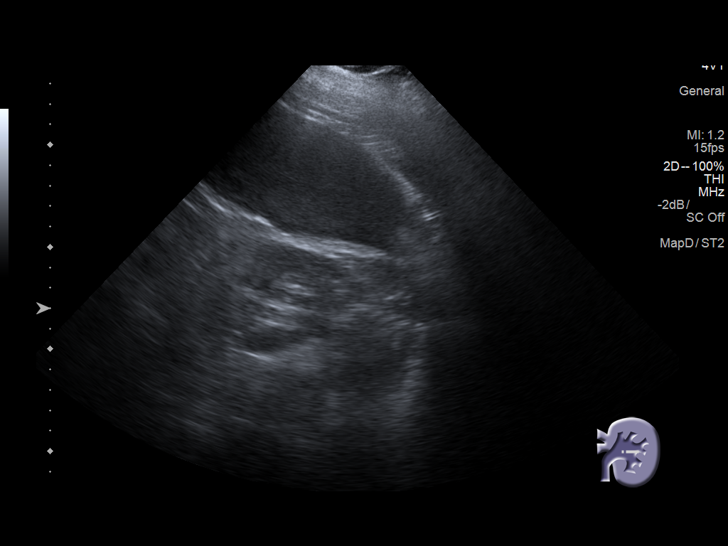
[im 73/73]
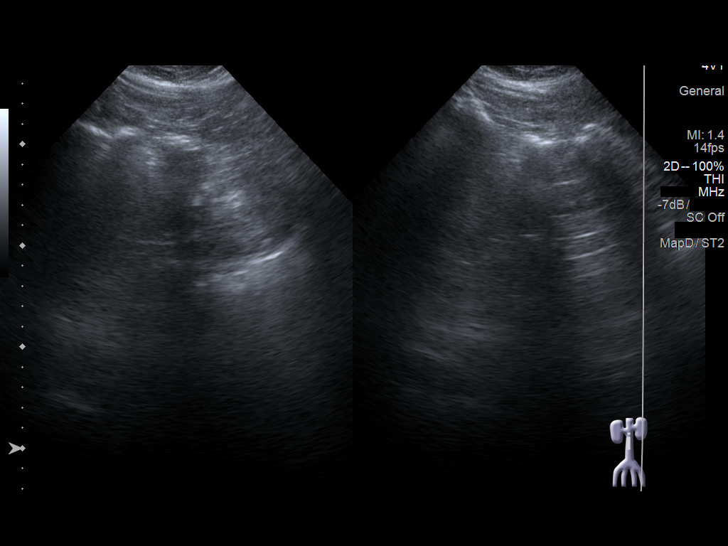

[14 of 25 positions shown; findings below may reference images not displayed]

FINDINGS: Gallbladder: Cholecystectomy.

Common bile duct: Diameter: 3.6 mm

Liver: Liver is echogenic consistent fatty infiltration and/or
hepatocellular disease. No focal hepatic abnormality identified.

IVC: No abnormality visualized.

Pancreas: Visualized portion unremarkable.

Spleen: Prominent splenic enlargement 20.3 cm with a volume of 0991
cc.

Right Kidney: Length: 14.0 cm. Echogenicity within normal limits. No
mass or hydronephrosis visualized.

Left Kidney: Length: 13.5 cm. Echogenicity within normal limits. No
mass or hydronephrosis visualized.

Abdominal aorta: No aneurysm visualized.

Other findings: None.
IMPRESSION: 1. Echogenic liver consistent with fatty infiltration and/or
hepatocellular disease. No focal hepatic abnormality identified.
Prior cholecystectomy. No biliary distention.

2.  Severe splenomegaly.

## 2017-08-28 ENCOUNTER — Other Ambulatory Visit: Payer: Self-pay | Admitting: Orthopaedic Surgery

## 2017-08-28 NOTE — Telephone Encounter (Signed)
Ok for refill? 

## 2017-08-29 ENCOUNTER — Other Ambulatory Visit: Payer: Self-pay | Admitting: Cardiology

## 2017-08-29 ENCOUNTER — Other Ambulatory Visit: Payer: Self-pay | Admitting: Family Medicine

## 2017-08-29 NOTE — Telephone Encounter (Signed)
Rx sent to pharmacy   

## 2017-09-02 ENCOUNTER — Other Ambulatory Visit: Payer: Self-pay | Admitting: Family Medicine

## 2017-09-02 NOTE — Telephone Encounter (Signed)
OV 09/11/17

## 2017-09-10 ENCOUNTER — Other Ambulatory Visit (INDEPENDENT_AMBULATORY_CARE_PROVIDER_SITE_OTHER): Payer: Self-pay | Admitting: Orthopaedic Surgery

## 2017-09-10 ENCOUNTER — Other Ambulatory Visit: Payer: Self-pay | Admitting: Family Medicine

## 2017-09-10 NOTE — Telephone Encounter (Signed)
I called [facility] and spoke with Marylene Land states patient had her discontinue the med because he could not tolerate it. She asked to disregard request.

## 2017-09-11 ENCOUNTER — Encounter: Payer: Self-pay | Admitting: Family Medicine

## 2017-09-11 ENCOUNTER — Other Ambulatory Visit: Payer: Self-pay | Admitting: Family Medicine

## 2017-09-11 ENCOUNTER — Ambulatory Visit (INDEPENDENT_AMBULATORY_CARE_PROVIDER_SITE_OTHER): Payer: Medicare HMO | Admitting: Family Medicine

## 2017-09-11 VITALS — BP 122/85 | HR 68 | Temp 97.5°F | Ht 71.0 in | Wt 374.4 lb

## 2017-09-11 DIAGNOSIS — I10 Essential (primary) hypertension: Secondary | ICD-10-CM

## 2017-09-11 DIAGNOSIS — I5032 Chronic diastolic (congestive) heart failure: Secondary | ICD-10-CM | POA: Diagnosis not present

## 2017-09-11 DIAGNOSIS — E1159 Type 2 diabetes mellitus with other circulatory complications: Secondary | ICD-10-CM

## 2017-09-11 DIAGNOSIS — E1169 Type 2 diabetes mellitus with other specified complication: Secondary | ICD-10-CM | POA: Diagnosis not present

## 2017-09-11 DIAGNOSIS — E1165 Type 2 diabetes mellitus with hyperglycemia: Secondary | ICD-10-CM | POA: Diagnosis not present

## 2017-09-11 DIAGNOSIS — I480 Paroxysmal atrial fibrillation: Secondary | ICD-10-CM

## 2017-09-11 DIAGNOSIS — M1A9XX0 Chronic gout, unspecified, without tophus (tophi): Secondary | ICD-10-CM | POA: Diagnosis not present

## 2017-09-11 DIAGNOSIS — I152 Hypertension secondary to endocrine disorders: Secondary | ICD-10-CM

## 2017-09-11 DIAGNOSIS — E785 Hyperlipidemia, unspecified: Secondary | ICD-10-CM | POA: Diagnosis not present

## 2017-09-11 LAB — BAYER DCA HB A1C WAIVED: HB A1C (BAYER DCA - WAIVED): 5.8 % (ref <7.0)

## 2017-09-11 MED ORDER — INDOMETHACIN 50 MG PO CAPS: 50.0000 mg | ORAL_CAPSULE | Freq: Two times a day (BID) | ORAL | 3 refills | Status: DC

## 2017-09-11 MED ORDER — DILTIAZEM HCL ER COATED BEADS 300 MG PO CP24: ORAL_CAPSULE | ORAL | 3 refills | Status: DC

## 2017-09-11 MED ORDER — COLCHICINE 0.6 MG PO CAPS: ORAL_CAPSULE | ORAL | 3 refills | Status: DC

## 2017-09-11 NOTE — Progress Notes (Signed)
BP 122/85   Pulse 68   Temp (!) 97.5 F (36.4 C) (Oral)   Ht _0  (1.803 m)   Wt (!) 374 lb 6.4 oz (169.8 kg)   BMI 52.22 kg/m    Subjective:    Patient ID: [name], male    DOB: [birth year], 52 y.o.   MRN: [id number]  HPI: [name] is a 52 y.o. male presenting on 09/11/2017 for Hyperlipidemia (3 month follow up) and Establish Care (no complaints )   HPI Hypertension Patient is currently on Cardizem and metolazone and torsemide which are also for his CHF, and their blood pressure today is 122/85. Patient denies any lightheadedness or dizziness. Patient denies headaches, blurred vision, chest pains, shortness of breath, or weakness. Denies any side effects from medication and is content with current medication.   Type 2 diabetes mellitus Patient comes in today for recheck of his diabetes. Patient has been currently taking no medication, it may have been steroid-induced before, his last A1c was 5.9 and normal.. Patient is not currently on an ACE inhibitor/ARB. Patient has not seen an ophthalmologist this year. Patient denies any issues with their feet.   Hyperlipidemia Patient is coming in for recheck of his hyperlipidemia. The patient is currently taking Zetia. They deny any issues with myalgias or history of liver damage from it. They deny any focal numbness or weakness or chest pain.   Chronic gout medication refill Patient has chronic gout and takes indomethacin and colchicine when he has flares and says it has been doing well for him.  He sometimes will take the colchicine twice daily for prevention of the time.  A. fib and CHF Patient has A. fib but denies any symptoms recently in the medication has been controlling his heart rate.  He very infrequently has palpitations.  Relevant past medical, surgical, family and social history reviewed and updated as indicated. Interim medical history since our last visit reviewed. Allergies and medications reviewed and  updated.  Review of Systems  Constitutional: Negative for chills and fever.  Eyes: Negative for visual disturbance.  Respiratory: Negative for shortness of breath and wheezing.   Cardiovascular: Negative for chest pain, palpitations and leg swelling.  Genitourinary: Negative for flank pain.  Musculoskeletal: Negative for arthralgias, back pain, gait problem and joint swelling.  Skin: Negative for rash.  Neurological: Negative for dizziness, weakness, light-headedness and numbness.  All other systems reviewed and are negative.   Per HPI unless specifically indicated above   Allergies as of 09/11/2017      Reactions   Allopurinol Other (See Comments)   Joint pain worse    Oxycodone Base Itching   Prozac [fluoxetine Hcl] Itching      Medication List        Accurate as of 09/11/17  8:41 AM. Always use your most recent med list.          albuterol 108 (90 Base) MCG/ACT inhaler Commonly known as:  PROVENTIL HFA;VENTOLIN HFA Inhale 2 puffs into the lungs every 6 (six) hours as needed for wheezing or shortness of breath.   ALPRAZolam 1 MG tablet Commonly known as:  XANAX TAKE 1 OR 2 TABLETS BY MOUTH AT BEDTIME   aspirin EC 81 MG tablet Take 81 mg by mouth every 6 (six) hours as needed (CHEST PAIN).   betamethasone dipropionate 0.05 % cream Commonly known as:  DIPROLENE APPLY TO AFFECTED AREA TWICE A DAY   buPROPion 300 MG 24 hr tablet Commonly known as:  WELLBUTRIN XL Take 1 tablet (300 mg total) by mouth daily.   carbamazepine 200 MG tablet Commonly known as:  TEGRETOL Take 200 mg by mouth 2 (two) times daily.   Colchicine 0.6 MG Caps Take 1 Capsule by mouth 2 times a day as needed   diltiazem 300 MG 24 hr capsule Commonly known as:  TIAZAC Take 1 Capsule by mouth once daily   diltiazem 300 MG 24 hr capsule Commonly known as:  CARDIZEM CD Take 1 capsule by mouth daily   diltiazem 90 MG 12 hr capsule Commonly known as:  CARDIZEM SR Take 1 capsule (90 mg total)  by mouth daily as needed (palpitations).   doxepin 10 MG capsule Commonly known as:  SINEQUAN TAKE ONE CAPSULE BY MOUTH AT BEDTIME   esomeprazole 40 MG capsule Commonly known as:  NEXIUM Take by mouth.   ezetimibe 10 MG tablet Commonly known as:  ZETIA Take 1 tablet (10 mg total) by mouth daily.   fluticasone 50 MCG/ACT nasal spray Commonly known as:  FLONASE Place 2 sprays into both nostrils daily as needed. For allergies   folic acid 1 MG tablet Commonly known as:  FOLVITE TAKE ONE TABLET BY MOUTH ONCE DAILY - EMERGENCY REFILL FAXED DR.   gabapentin 800 MG tablet Commonly known as:  NEURONTIN TAKE 1 TABLET BY MOUTH THREE TIMES DAILY   glucose blood test strip 1 each by Other route as needed for other. Use as instructed   hydrocortisone 1 % ointment Apply 1 application topically 3 (three) times daily as needed.   ibuprofen 400 MG tablet Commonly known as:  ADVIL,MOTRIN Take 400 mg by mouth every 8 (eight) hours as needed. for pain   indomethacin 50 MG capsule Commonly known as:  INDOCIN Take 1 capsule (50 mg total) by mouth 2 (two) times daily with a meal.   ipratropium 0.02 % nebulizer solution Commonly known as:  ATROVENT Take 2.5 mLs (500 mcg total) by nebulization 4 (four) times daily as needed. For wheezing   lactulose 10 GM/15ML solution Commonly known as:  CHRONULAC Take 30 mLs (20 g total) by mouth 2 (two) times daily. Please fill, but patient needs OV   lidocaine 5 % ointment Commonly known as:  XYLOCAINE Apply 1 application topically 4 (four) times daily as needed. To knees and ankle as needed for pain   LINZESS 145 MCG Caps capsule Generic drug:  linaclotide Take 145 mcg by mouth daily before breakfast.   Magnesium 400 MG Caps Take 400 mg by mouth every morning.   metolazone 2.5 MG tablet Commonly known as:  ZAROXOLYN TAKE 1 TABLET BY MOUTH 1 & 1/2 HOURS PRIOR TO TORSEMIDE FOR 3 DAYS AS NEEDED FOR SEVERE SWELLING   milk thistle 175 MG  tablet Take 175 mg by mouth 4 (four) times daily.   MULTI COMPLETE PO Take 1 tablet by mouth daily.   MUSCLE RUB 10-15 % Crea Apply 1 application topically 3 (three) times daily as needed for muscle pain. For muscle pain   naproxen 500 MG tablet Commonly known as:  NAPROSYN TAKE ONE TABLET BY MOUTH TWICE DAILY with meals (start AFTER prednisone)   NASAL DECONGESTANT 1 % nasal spray Generic drug:  phenylephrine Place 1 drop into the nose every 4 (four) hours as needed (ALLERGIES).   neomycin-bacitracin-polymyxin ointment Commonly known as:  NEOSPORIN Apply 1 application topically every 12 (twelve) hours as needed for wound care. Reported on 06/01/2015   nitroGLYCERIN 0.4 MG SL tablet Commonly known as:  NITROSTAT Place 1 tablet (0.4 mg total) under the tongue every 5 (five) minutes as needed for chest pain (may repeat x3).   Olopatadine HCl 0.2 % Soln Place 1 drop into both eyes 2 (two) times daily as needed (dry/irritated eyes).   pantoprazole 40 MG tablet Commonly known as:  PROTONIX Take 1 Tablet by mouth every morning   potassium chloride SA 20 MEQ tablet Commonly known as:  K-DUR,KLOR-CON TAKE 1 TABLET BY MOUTH TWICE DAILY   propranolol 20 MG tablet Commonly known as:  INDERAL Take 1 tablet (20 mg total) by mouth daily.   sodium chloride 0.65 % nasal spray Commonly known as:  OCEAN Place into the nose.   torsemide 20 MG tablet Commonly known as:  DEMADEX Take 1 tablet (20 mg total) by mouth 3 (three) times daily.   vitamin B-12 1000 MCG tablet Commonly known as:  CYANOCOBALAMIN Take by mouth.   VOLTAREN 1 % Gel Generic drug:  diclofenac sodium Apply FOUR grams topically FOUR times daily as needed for pain          Objective:    BP 122/85   Pulse 68   Temp (!) 97.5 F (36.4 C) (Oral)   Ht _0  (1.803 m)   Wt (!) 374 lb 6.4 oz (169.8 kg)   BMI 52.22 kg/m   Wt Readings from Last 3 Encounters:  09/11/17 (!) 374 lb 6.4 oz (169.8 kg)  06/21/17  (!) 386 lb (175.1 kg)  05/10/17 (!) 380 lb 4.8 oz (172.5 kg)    Physical Exam  Constitutional: He is oriented to person, place, and time. He appears well-developed and well-nourished. No distress.  Eyes: Conjunctivae are normal. No scleral icterus.  Neck: Neck supple. No thyromegaly present.  Cardiovascular: Normal rate, regular rhythm, normal heart sounds and intact distal pulses. Exam reveals no friction rub.  No murmur heard. Pulmonary/Chest: Effort normal and breath sounds normal. No respiratory distress. He has no wheezes.  Musculoskeletal: Normal range of motion. He exhibits no edema.  Lymphadenopathy:    He has no cervical adenopathy.  Neurological: He is alert and oriented to person, place, and time. Coordination normal.  Skin: Skin is warm and dry. No rash noted. He is not diaphoretic.  Psychiatric: He has a normal mood and affect. His behavior is normal.  Nursing note and vitals reviewed.       Assessment & Plan:   Problem List Items Addressed This Visit      Cardiovascular and Mediastinum   Hypertension associated with diabetes ([place]) - Primary   Relevant Medications   diltiazem (CARTIA XT) 300 MG 24 hr capsule   Atrial fibrillation (HCC)   Relevant Medications   diltiazem (CARTIA XT) 300 MG 24 hr capsule   Other Relevant Orders   CBC with Differential/Platelet   DIASTOLIC HEART FAILURE, CHRONIC   Relevant Medications   diltiazem (CARTIA XT) 300 MG 24 hr capsule     Endocrine   T2DM (type 2 diabetes mellitus) ([place])   Relevant Orders   CMP14+EGFR   Bayer DCA Hb A1c Waived   Microalbumin / creatinine urine ratio   Hyperlipidemia associated with type 2 diabetes mellitus (HCC)   Relevant Medications   diltiazem (CARTIA XT) 300 MG 24 hr capsule     Other   Gout   Relevant Medications   Colchicine 0.6 MG CAPS   indomethacin (INDOCIN) 50 MG capsule   Other Relevant Orders   CBC with Differential/Platelet   OBESITY, MORBID  Follow up plan: Return in  about 3 months (around 12/11/2017), or if symptoms worsen or fail to improve, for Recheck hypertension and cholesterol.  Counseling provided for all of the vaccine components Orders Placed This Encounter  Procedures  . CMP14+EGFR  . Bayer DCA Hb A1c Waived  . Microalbumin / creatinine urine ratio  . CBC with Differential/Platelet    Caryl Pina, MD [facility] 09/11/2017, 8:41 AM

## 2017-09-12 LAB — CBC WITH DIFFERENTIAL/PLATELET
BASOS: 1 %
Basophils Absolute: 0 10*3/uL (ref 0.0–0.2)
EOS (ABSOLUTE): 0.2 10*3/uL (ref 0.0–0.4)
EOS: 7 %
HEMOGLOBIN: 14.1 g/dL (ref 13.0–17.7)
Hematocrit: 41.1 % (ref 37.5–51.0)
Immature Grans (Abs): 0 10*3/uL (ref 0.0–0.1)
Immature Granulocytes: 0 %
Lymphocytes Absolute: 0.7 10*3/uL (ref 0.7–3.1)
Lymphs: 30 %
MCH: 27.8 pg (ref 26.6–33.0)
MCHC: 34.3 g/dL (ref 31.5–35.7)
MCV: 81 fL (ref 79–97)
Monocytes Absolute: 0.2 10*3/uL (ref 0.1–0.9)
Monocytes: 7 %
NEUTROS ABS: 1.2 10*3/uL — AB (ref 1.4–7.0)
Neutrophils: 55 %
Platelets: 48 10*3/uL — CL (ref 150–450)
RBC: 5.08 x10E6/uL (ref 4.14–5.80)
RDW: 15.9 % — ABNORMAL HIGH (ref 12.3–15.4)
WBC: 2.2 10*3/uL — CL (ref 3.4–10.8)

## 2017-09-12 LAB — CMP14+EGFR
A/G RATIO: 1.7 (ref 1.2–2.2)
ALBUMIN: 4.5 g/dL (ref 3.5–5.5)
ALT: 26 IU/L (ref 0–44)
AST: 34 IU/L (ref 0–40)
Alkaline Phosphatase: 102 IU/L (ref 39–117)
BUN / CREAT RATIO: 16 (ref 9–20)
BUN: 20 mg/dL (ref 6–24)
Bilirubin Total: 0.7 mg/dL (ref 0.0–1.2)
CALCIUM: 9.4 mg/dL (ref 8.7–10.2)
CO2: 29 mmol/L (ref 20–29)
Chloride: 95 mmol/L — ABNORMAL LOW (ref 96–106)
Creatinine, Ser: 1.29 mg/dL — ABNORMAL HIGH (ref 0.76–1.27)
GFR, EST AFRICAN AMERICAN: 73 mL/min/{1.73_m2} (ref >59)
GFR, EST NON AFRICAN AMERICAN: 63 mL/min/{1.73_m2} (ref >59)
Globulin, Total: 2.6 g/dL (ref 1.5–4.5)
Glucose: 121 mg/dL — ABNORMAL HIGH (ref 65–99)
POTASSIUM: 3 mmol/L — AB (ref 3.5–5.2)
Sodium: 142 mmol/L (ref 134–144)
TOTAL PROTEIN: 7.1 g/dL (ref 6.0–8.5)

## 2017-09-12 NOTE — Telephone Encounter (Signed)
Last seen yesterday

## 2017-09-30 ENCOUNTER — Other Ambulatory Visit: Payer: Self-pay | Admitting: Family Medicine

## 2017-10-08 ENCOUNTER — Other Ambulatory Visit (INDEPENDENT_AMBULATORY_CARE_PROVIDER_SITE_OTHER): Payer: Self-pay | Admitting: Orthopaedic Surgery

## 2017-10-08 NOTE — Telephone Encounter (Signed)
Ok for refill? 

## 2017-10-29 ENCOUNTER — Other Ambulatory Visit (INDEPENDENT_AMBULATORY_CARE_PROVIDER_SITE_OTHER): Payer: Self-pay | Admitting: Internal Medicine

## 2017-10-29 DIAGNOSIS — D708 Other neutropenia: Secondary | ICD-10-CM

## 2017-11-08 ENCOUNTER — Other Ambulatory Visit: Payer: Self-pay | Admitting: Family Medicine

## 2017-11-08 ENCOUNTER — Other Ambulatory Visit: Payer: Self-pay

## 2017-11-08 MED ORDER — EZETIMIBE 10 MG PO TABS: 10.0000 mg | ORAL_TABLET | Freq: Every day | ORAL | 0 refills | Status: DC

## 2017-11-08 NOTE — Telephone Encounter (Signed)
Last lipid 01/25/17

## 2017-11-13 ENCOUNTER — Telehealth: Payer: Self-pay | Admitting: Family Medicine

## 2017-11-13 NOTE — Telephone Encounter (Signed)
Now it this is like that I would have him follow-up with pain management.  Have him make an appointment and get transportation.  With wherever he goes he does get any transportation.

## 2017-11-13 NOTE — Telephone Encounter (Signed)
Patient states that he was going to Dr. Francesco Runner office in [place] and it got closed down so he had to find a new pain management.  Patient states that his truck is broke down and he is not able to go to the new pain management in [place]- does not know the name of it.  Patient is wanting to know if Dr. Warrick Parisian can fill his Dilaudid 18m for one month so he can get his truck fixed and go to [place] new pain [place] he has been out of it for awhile but his joint pain is getting worse.  Patient aware that more than likely Dr. DWarrick Parisianis not going to fill this medication but he would like to ask him just to be sure. Please advise

## 2017-11-13 NOTE — Telephone Encounter (Signed)
Attempted to contact patient - NA °

## 2017-11-13 NOTE — Telephone Encounter (Signed)
PT is needing to talk to Janett Billow about his xanax and dilaudid

## 2017-11-18 NOTE — Telephone Encounter (Signed)
Closing chart

## 2017-11-19 NOTE — Telephone Encounter (Signed)
Follow through as planned with pain management

## 2017-11-24 NOTE — Progress Notes (Deleted)
HPI The patient returns for follow up of diastolic heart failure and a history of atrial fibrillation.  ***    He has been having back pain and Dr. Wendi Snipes wanted him to be seen for possible preop evaluation although no back surgery is planned.  He has been off and on warfarin in the past with some noncompliance and self medication. Also he was in the hospital in January at [facility]. I reviewed these records for this appointment. He had epistaxis and GI bleeding. He has supratherapeutic INR. He was taken off anticoagulation. He had no acute cardiac complaints with that. Now he comes back to the preoperative evaluation as above. He is limited by some knee pain. But he is able to push a lawnmower. He says he can do it once he's not getting any chest pressure, neck or arm discomfort. He's not having any new shortness of breath, PND or orthopnea. He rarely has palpitations and his only taken an extra diltiazem once since I increased his Cardizem dose to 300 mg. He's had no further GI bleeding. He has some mild lower extremity swelling.  Allergies  Allergen Reactions  . Allopurinol Other (See Comments)    Joint pain worse   . Oxycodone Base Itching  . Prozac [Fluoxetine Hcl] Itching    Current Outpatient Medications  Medication Sig Dispense Refill  . albuterol (PROAIR HFA) 108 (90 Base) MCG/ACT inhaler Inhale 2 puffs into the lungs every 6 (six) hours as needed for wheezing or shortness of breath. 8.5 g 4  . allopurinol (ZYLOPRIM) 100 MG tablet TAKE ONE TABLET BY MOUTH DAILY 30 tablet 2  . ALPRAZolam (XANAX) 1 MG tablet TAKE 1 OR 2 TABLETS BY MOUTH AT BEDTIME  3  . aspirin EC 81 MG tablet Take 81 mg by mouth every 6 (six) hours as needed (CHEST PAIN).    Marland Kitchen betamethasone dipropionate (DIPROLENE) 0.05 % cream APPLY TO AFFECTED AREA TWICE A DAY (Patient taking differently: 1 application to legs once a day as needed for itching.) 45 g 3  . buPROPion (WELLBUTRIN XL) 300 MG 24 hr tablet Take 1 tablet  (300 mg total) by mouth daily. 90 tablet 3  . carbamazepine (TEGRETOL) 200 MG tablet Take 200 mg by mouth 2 (two) times daily.  0  . Colchicine 0.6 MG CAPS Take 1 Capsule by mouth 2 times a day as needed 60 capsule 3  . diltiazem (CARDIZEM SR) 90 MG 12 hr capsule Take 1 capsule (90 mg total) by mouth daily as needed (palpitations). 30 capsule 5  . diltiazem (CARTIA XT) 300 MG 24 hr capsule Take 1 capsule by mouth daily 90 capsule 3  . diltiazem (TIAZAC) 300 MG 24 hr capsule Take 1 Capsule by mouth once daily 30 capsule 10  . doxepin (SINEQUAN) 10 MG capsule TAKE ONE CAPSULE BY MOUTH AT BEDTIME 30 capsule 2  . esomeprazole (NEXIUM) 40 MG capsule Take by mouth.    . ezetimibe (ZETIA) 10 MG tablet Take 1 tablet (10 mg total) by mouth daily. 90 tablet 0  . fluticasone (FLONASE) 50 MCG/ACT nasal spray Place 2 sprays into both nostrils daily as needed. For allergies 16 g 5  . folic acid (FOLVITE) 1 MG tablet TAKE ONE TABLET BY MOUTH ONCE DAILY - EMERGENCY REFILL FAXED DR. 30 tablet 11  . gabapentin (NEURONTIN) 800 MG tablet TAKE 1 TABLET BY MOUTH THREE TIMES DAILY 90 tablet 2  . glucose blood test strip 1 each by Other route as needed for other.  Use as instructed 100 each 4  . hydrocortisone 1 % ointment Apply 1 application topically 3 (three) times daily as needed. (Patient taking differently: Apply 1 application topically 3 (three) times daily as needed for itching ("leg pain"). ) 30 g 1  . ibuprofen (ADVIL,MOTRIN) 400 MG tablet Take 400 mg by mouth every 8 (eight) hours as needed. for pain  0  . indomethacin (INDOCIN) 50 MG capsule Take 1 capsule (50 mg total) by mouth 2 (two) times daily with a meal. 60 capsule 3  . ipratropium (ATROVENT) 0.02 % nebulizer solution Take 2.5 mLs (500 mcg total) by nebulization 4 (four) times daily as needed. For wheezing (Patient taking differently: Take 500 mcg by nebulization 4 (four) times daily as needed for wheezing or shortness of breath. For wheezing) 75 mL 3  .  lactulose (CHRONULAC) 10 GM/15ML solution Take 30 mLs (20 g total) by mouth 2 (two) times daily. Please fill, but patient needs OV 1892 mL 1  . lidocaine (XYLOCAINE) 5 % ointment Apply 1 application topically 4 (four) times daily as needed. To knees and ankle as needed for pain 35.44 g 0  . LINZESS 145 MCG CAPS capsule Take 145 mcg by mouth daily before breakfast.     . Magnesium 400 MG CAPS Take 400 mg by mouth every morning. 30 capsule 3  . Menthol-Methyl Salicylate (MUSCLE RUB) 10-15 % CREA Apply 1 application topically 3 (three) times daily as needed for muscle pain. For muscle pain     . metolazone (ZAROXOLYN) 2.5 MG tablet TAKE 1 TABLET BY MOUTH 1 & 1/2 HOURS PRIOR TO TORSEMIDE FOR 3 DAYS AS NEEDED FOR SEVERE SWELLING 20 tablet 0  . milk thistle 175 MG tablet Take 175 mg by mouth 4 (four) times daily.     . Multiple Vitamins-Minerals (MULTI COMPLETE PO) Take 1 tablet by mouth daily.     . naproxen (NAPROSYN) 500 MG tablet TAKE ONE TABLET BY MOUTH TWICE DAILY with meals (start AFTER prednisone)  3  . neomycin-bacitracin-polymyxin (NEOSPORIN) ointment Apply 1 application topically every 12 (twelve) hours as needed for wound care. Reported on 06/01/2015    . nitroGLYCERIN (NITROSTAT) 0.4 MG SL tablet Place 1 tablet (0.4 mg total) under the tongue every 5 (five) minutes as needed for chest pain (may repeat x3). 25 tablet 11  . Olopatadine HCl 0.2 % SOLN Place 1 drop into both eyes 2 (two) times daily as needed (dry/irritated eyes).    . pantoprazole (PROTONIX) 40 MG tablet TAKE ONE TABLET BY MOUTH EVERY MORNING 30 tablet 11  . phenylephrine (NASAL DECONGESTANT) 1 % nasal spray Place 1 drop into the nose every 4 (four) hours as needed (ALLERGIES).     . potassium chloride SA (K-DUR,KLOR-CON) 20 MEQ tablet TAKE 1 TABLET BY MOUTH TWICE DAILY 60 tablet 3  . propranolol (INDERAL) 20 MG tablet Take 1 tablet (20 mg total) by mouth daily. 90 tablet 3  . sodium chloride (OCEAN) 0.65 % nasal spray Place into  the nose.    . torsemide (DEMADEX) 20 MG tablet Take 1 tablet (20 mg total) by mouth 3 (three) times daily. 90 tablet 3  . vitamin B-12 (CYANOCOBALAMIN) 1000 MCG tablet Take by mouth.    . VOLTAREN 1 % GEL Apply FOUR grams topically FOUR times daily as needed for pain 300 g 2   No current facility-administered medications for this visit.     Past Medical History:  Diagnosis Date  . Anxiety   . Atrial fibrillation (  [place])   . CHF (congestive heart failure) ([place])   . Cirrhosis ([place])    NASH  . Constipation   . COPD (chronic obstructive pulmonary disease) ([place])   . Depression, recurrent ([place]) 07/18/2016  . Diabetes mellitus    type 2  . Genu varus   . GERD (gastroesophageal reflux disease)   . Gout   . Heart murmur   . Leukopenia 07/08/2015  . Neuromuscular disorder (HCC)    neuropathy in hands and feet  . Psoriasis   . RA (rheumatoid arthritis) ([place])   . Sleep apnea    cpap used- level 10 and greater  . Thrombocytopenia ([place])     Past Surgical History:  Procedure Laterality Date  . ANKLE SURGERY     right ankle talor repair  . CHOLECYSTECTOMY  sept 2016  . CHOLECYSTECTOMY    . COLONOSCOPY  05/08/2011   Procedure: COLONOSCOPY;  Surgeon: Rogene Houston, MD;  Location: [facility];  Service: Endoscopy;  Laterality: N/A;  730  . ESOPHAGOGASTRODUODENOSCOPY  02/28/2011   Procedure: ESOPHAGOGASTRODUODENOSCOPY (EGD);  Surgeon: Rogene Houston, MD;  Location: [facility];  Service: Endoscopy;  Laterality: N/A;  1200  . ESOPHAGOGASTRODUODENOSCOPY N/A 06/11/2012   Procedure: ESOPHAGOGASTRODUODENOSCOPY (EGD);  Surgeon: Rogene Houston, MD;  Location: [facility];  Service: Endoscopy;  Laterality: N/A;  1200  FYI patient is 400 pounds  . ESOPHAGOGASTRODUODENOSCOPY (EGD) WITH PROPOFOL N/A 04/21/2014   Procedure: ESOPHAGOGASTRODUODENOSCOPY (EGD) WITH PROPOFOL;  Surgeon: Rogene Houston, MD;  Location: [facility];  Service: Endoscopy;  Laterality: N/A;  . HERNIA REPAIR Right    as child;  inguinal  . HERNIA REPAIR  sept 2016  . LIVER BIOPSY  2012  . SCIATIC NERVE EXPLORATION    . TONSILLECTOMY      ROS:  Positive for joint and back pain. ***  Otherwise as stated in the HPI and negative for all other systems.    PHYSICAL EXAM There were no vitals taken for this visit.  GENERAL:  Well appearing NECK:  No jugular venous distention, waveform within normal limits, carotid upstroke brisk and symmetric, no bruits, no thyromegaly LUNGS:  Clear to auscultation bilaterally CHEST:  Unremarkable HEART:  PMI not displaced or sustained,S1 and S2 within normal limits, no S3, no S4, no clicks, no rubs, *** murmurs ABD:  Flat, positive bowel sounds normal in frequency in pitch, no bruits, no rebound, no guarding, no midline pulsatile mass, no hepatomegaly, no splenomegaly EXT:  2 plus pulses throughout, no edema, no cyanosis no clubbing    *** GENERAL:  Well appearing, unkempt   NECK:  No jugular venous distention, waveform within normal limits, carotid upstroke brisk and symmetric, no bruits, no thyromegaly LUNGS:  Clear to auscultation bilaterally CHEST:  Unremarkable HEART:  PMI not displaced or sustained,S1 and S2 within normal limits, no S3, no S4, no clicks, no rubs, no murmurs ABD:  Flat, positive bowel sounds normal in frequency in pitch, no bruits, no rebound, no guarding, no midline pulsatile mass, no hepatomegaly, no splenomegaly, umbilical hernia large EXT:  2 plus pulses throughout, no edema, no cyanosis no clubbing SKIN:  Psoriasis, chronic venous stasis changes left greater than right leg, mild leg edema   EKG:  Sinus rhythm, rate ***, axis within normal limits, intervals within normal limits.  No acute ST T wave changes.  11/24/2017   ASSESSMENT AND PLAN   ATRIAL FIBRILLATION - ***  I agree that with his past history and GI bleed earlier  this year he is not a candidate for anticoagulation.  He'll continue on the current dose of Cardizem.  OBESITY, MORBID -  ***   He's gained some weight as he's been on a new antidepressant and this may be related. He can continue to work on this going forward.  DIASTOLIC HEART FAILURE, CHRONIC - ***  He seems to be euvolemic. No change in therapy is indicated.  PORTAL VEIN THROMBUS - ***  I reviewed the results of the abdominal ultrasound.  However, he's not an anticoagulation candidate.  SLEEP APNEA - ***  He says he is a sleep evaluation of his insurance will approve it. For now he is using his current CPAP.  PREOP - ***  He might have umbilical hernia repair or back surgery. He has a high functional level. He has no unstable symptoms or physical findings. Therefore, according to ACC/AHA guidelines the patient at acceptable risk for the planned procedure.

## 2017-11-27 ENCOUNTER — Ambulatory Visit: Payer: Medicare HMO | Admitting: Cardiology

## 2017-11-27 DIAGNOSIS — R0989 Other specified symptoms and signs involving the circulatory and respiratory systems: Secondary | ICD-10-CM

## 2017-11-28 ENCOUNTER — Other Ambulatory Visit: Payer: Self-pay | Admitting: *Deleted

## 2017-11-28 MED ORDER — TORSEMIDE 20 MG PO TABS: 20.0000 mg | ORAL_TABLET | Freq: Three times a day (TID) | ORAL | 0 refills | Status: DC

## 2017-12-11 ENCOUNTER — Ambulatory Visit: Payer: Medicare HMO | Admitting: Family Medicine

## 2017-12-22 ENCOUNTER — Other Ambulatory Visit: Payer: Self-pay | Admitting: Family Medicine

## 2018-01-21 ENCOUNTER — Other Ambulatory Visit: Payer: Self-pay | Admitting: *Deleted

## 2018-01-21 DIAGNOSIS — K7030 Alcoholic cirrhosis of liver without ascites: Secondary | ICD-10-CM

## 2018-01-22 MED ORDER — LACTULOSE 10 GM/15ML PO SOLN: 20.0000 g | Freq: Two times a day (BID) | ORAL | 1 refills | Status: DC

## 2018-01-25 DIAGNOSIS — Z8249 Family history of ischemic heart disease and other diseases of the circulatory system: Secondary | ICD-10-CM | POA: Diagnosis not present

## 2018-01-25 DIAGNOSIS — J449 Chronic obstructive pulmonary disease, unspecified: Secondary | ICD-10-CM | POA: Diagnosis not present

## 2018-01-25 DIAGNOSIS — M109 Gout, unspecified: Secondary | ICD-10-CM | POA: Diagnosis not present

## 2018-01-25 DIAGNOSIS — I509 Heart failure, unspecified: Secondary | ICD-10-CM | POA: Diagnosis not present

## 2018-01-25 DIAGNOSIS — E1140 Type 2 diabetes mellitus with diabetic neuropathy, unspecified: Secondary | ICD-10-CM | POA: Diagnosis not present

## 2018-01-25 DIAGNOSIS — Z79899 Other long term (current) drug therapy: Secondary | ICD-10-CM | POA: Diagnosis not present

## 2018-01-25 DIAGNOSIS — K219 Gastro-esophageal reflux disease without esophagitis: Secondary | ICD-10-CM | POA: Diagnosis not present

## 2018-01-25 DIAGNOSIS — I4891 Unspecified atrial fibrillation: Secondary | ICD-10-CM | POA: Diagnosis not present

## 2018-01-25 DIAGNOSIS — Z8639 Personal history of other endocrine, nutritional and metabolic disease: Secondary | ICD-10-CM | POA: Diagnosis not present

## 2018-01-25 DIAGNOSIS — I110 Hypertensive heart disease with heart failure: Secondary | ICD-10-CM | POA: Diagnosis not present

## 2018-01-27 ENCOUNTER — Other Ambulatory Visit: Payer: Self-pay | Admitting: Family Medicine

## 2018-01-27 DIAGNOSIS — M1A9XX0 Chronic gout, unspecified, without tophus (tophi): Secondary | ICD-10-CM

## 2018-01-27 NOTE — Telephone Encounter (Signed)
OV 02/17/18

## 2018-01-31 ENCOUNTER — Other Ambulatory Visit: Payer: Self-pay | Admitting: Family Medicine

## 2018-02-13 DIAGNOSIS — G8929 Other chronic pain: Secondary | ICD-10-CM | POA: Diagnosis not present

## 2018-02-13 DIAGNOSIS — E1140 Type 2 diabetes mellitus with diabetic neuropathy, unspecified: Secondary | ICD-10-CM | POA: Diagnosis not present

## 2018-02-13 DIAGNOSIS — E119 Type 2 diabetes mellitus without complications: Secondary | ICD-10-CM | POA: Diagnosis not present

## 2018-02-13 DIAGNOSIS — M25571 Pain in right ankle and joints of right foot: Secondary | ICD-10-CM | POA: Diagnosis not present

## 2018-02-13 DIAGNOSIS — R140 Abdominal distension (gaseous): Secondary | ICD-10-CM | POA: Diagnosis not present

## 2018-02-13 DIAGNOSIS — M1990 Unspecified osteoarthritis, unspecified site: Secondary | ICD-10-CM | POA: Diagnosis not present

## 2018-02-13 DIAGNOSIS — I5032 Chronic diastolic (congestive) heart failure: Secondary | ICD-10-CM | POA: Diagnosis not present

## 2018-02-13 DIAGNOSIS — K7460 Unspecified cirrhosis of liver: Secondary | ICD-10-CM | POA: Diagnosis not present

## 2018-02-13 DIAGNOSIS — Z79899 Other long term (current) drug therapy: Secondary | ICD-10-CM | POA: Diagnosis not present

## 2018-02-13 DIAGNOSIS — Z8639 Personal history of other endocrine, nutritional and metabolic disease: Secondary | ICD-10-CM | POA: Diagnosis not present

## 2018-02-13 DIAGNOSIS — J449 Chronic obstructive pulmonary disease, unspecified: Secondary | ICD-10-CM | POA: Diagnosis not present

## 2018-02-15 DIAGNOSIS — Z91128 Patient's intentional underdosing of medication regimen for other reason: Secondary | ICD-10-CM | POA: Diagnosis not present

## 2018-02-15 DIAGNOSIS — G894 Chronic pain syndrome: Secondary | ICD-10-CM | POA: Diagnosis not present

## 2018-02-15 DIAGNOSIS — K5730 Diverticulosis of large intestine without perforation or abscess without bleeding: Secondary | ICD-10-CM | POA: Diagnosis not present

## 2018-02-15 DIAGNOSIS — G8929 Other chronic pain: Secondary | ICD-10-CM | POA: Diagnosis not present

## 2018-02-15 DIAGNOSIS — K7460 Unspecified cirrhosis of liver: Secondary | ICD-10-CM | POA: Diagnosis not present

## 2018-02-15 DIAGNOSIS — I480 Paroxysmal atrial fibrillation: Secondary | ICD-10-CM | POA: Diagnosis not present

## 2018-02-15 DIAGNOSIS — R52 Pain, unspecified: Secondary | ICD-10-CM | POA: Diagnosis not present

## 2018-02-15 DIAGNOSIS — R1084 Generalized abdominal pain: Secondary | ICD-10-CM | POA: Diagnosis not present

## 2018-02-15 DIAGNOSIS — E1142 Type 2 diabetes mellitus with diabetic polyneuropathy: Secondary | ICD-10-CM | POA: Diagnosis not present

## 2018-02-15 DIAGNOSIS — M62830 Muscle spasm of back: Secondary | ICD-10-CM | POA: Diagnosis not present

## 2018-02-15 DIAGNOSIS — S161XXA Strain of muscle, fascia and tendon at neck level, initial encounter: Secondary | ICD-10-CM | POA: Diagnosis not present

## 2018-02-15 DIAGNOSIS — K4090 Unilateral inguinal hernia, without obstruction or gangrene, not specified as recurrent: Secondary | ICD-10-CM | POA: Diagnosis not present

## 2018-02-15 DIAGNOSIS — T383X6A Underdosing of insulin and oral hypoglycemic [antidiabetic] drugs, initial encounter: Secondary | ICD-10-CM | POA: Diagnosis not present

## 2018-02-15 DIAGNOSIS — M109 Gout, unspecified: Secondary | ICD-10-CM | POA: Diagnosis not present

## 2018-02-15 DIAGNOSIS — E1149 Type 2 diabetes mellitus with other diabetic neurological complication: Secondary | ICD-10-CM | POA: Diagnosis not present

## 2018-02-15 DIAGNOSIS — X501XXA Overexertion from prolonged static or awkward postures, initial encounter: Secondary | ICD-10-CM | POA: Diagnosis not present

## 2018-02-15 DIAGNOSIS — K429 Umbilical hernia without obstruction or gangrene: Secondary | ICD-10-CM | POA: Diagnosis not present

## 2018-02-15 DIAGNOSIS — M545 Low back pain: Secondary | ICD-10-CM | POA: Diagnosis not present

## 2018-02-15 DIAGNOSIS — R109 Unspecified abdominal pain: Secondary | ICD-10-CM | POA: Diagnosis not present

## 2018-02-16 ENCOUNTER — Other Ambulatory Visit: Payer: Self-pay | Admitting: Family

## 2018-02-16 DIAGNOSIS — I10 Essential (primary) hypertension: Secondary | ICD-10-CM | POA: Diagnosis not present

## 2018-02-16 DIAGNOSIS — M549 Dorsalgia, unspecified: Secondary | ICD-10-CM | POA: Diagnosis not present

## 2018-02-16 DIAGNOSIS — Z79899 Other long term (current) drug therapy: Secondary | ICD-10-CM | POA: Diagnosis not present

## 2018-02-16 DIAGNOSIS — X501XXA Overexertion from prolonged static or awkward postures, initial encounter: Secondary | ICD-10-CM | POA: Diagnosis not present

## 2018-02-16 DIAGNOSIS — M545 Low back pain: Secondary | ICD-10-CM | POA: Diagnosis not present

## 2018-02-16 DIAGNOSIS — R531 Weakness: Secondary | ICD-10-CM | POA: Diagnosis not present

## 2018-02-16 DIAGNOSIS — R52 Pain, unspecified: Secondary | ICD-10-CM | POA: Diagnosis not present

## 2018-02-16 DIAGNOSIS — M109 Gout, unspecified: Secondary | ICD-10-CM | POA: Diagnosis not present

## 2018-02-16 DIAGNOSIS — S161XXA Strain of muscle, fascia and tendon at neck level, initial encounter: Secondary | ICD-10-CM | POA: Diagnosis not present

## 2018-02-16 DIAGNOSIS — Z791 Long term (current) use of non-steroidal anti-inflammatories (NSAID): Secondary | ICD-10-CM | POA: Diagnosis not present

## 2018-02-16 MED ORDER — VOLTAREN 1 % TD GEL: TRANSDERMAL | 2 refills | Status: DC

## 2018-02-16 MED ORDER — BACLOFEN 10 MG PO TABS: 10.0000 mg | ORAL_TABLET | Freq: Three times a day (TID) | ORAL | 0 refills | Status: DC

## 2018-02-16 MED ORDER — IBUPROFEN 400 MG PO TABS: 400.0000 mg | ORAL_TABLET | Freq: Three times a day (TID) | ORAL | 0 refills | Status: DC | PRN

## 2018-02-16 NOTE — Progress Notes (Signed)
Patient calls and states he has been to the hospital for pain in his back. He states he was given prescription of gabapentin but has not picked it up yet. Says his pain is significant and needs something sent in. I told him our policy that the on-call provider cannot send in controlled medicines and that he would have to follow up with his PCP. I have refilled his ibuprofen and sent in baclofen.  He has to follow up Monday morning with PCP

## 2018-02-17 ENCOUNTER — Ambulatory Visit (INDEPENDENT_AMBULATORY_CARE_PROVIDER_SITE_OTHER): Payer: Medicare HMO | Admitting: Family Medicine

## 2018-02-17 ENCOUNTER — Encounter: Payer: Self-pay | Admitting: Family Medicine

## 2018-02-17 VITALS — BP 101/66 | HR 70 | Temp 96.8°F

## 2018-02-17 DIAGNOSIS — E1169 Type 2 diabetes mellitus with other specified complication: Secondary | ICD-10-CM | POA: Diagnosis not present

## 2018-02-17 DIAGNOSIS — K7030 Alcoholic cirrhosis of liver without ascites: Secondary | ICD-10-CM

## 2018-02-17 DIAGNOSIS — E785 Hyperlipidemia, unspecified: Secondary | ICD-10-CM

## 2018-02-17 DIAGNOSIS — R69 Illness, unspecified: Secondary | ICD-10-CM | POA: Diagnosis not present

## 2018-02-17 DIAGNOSIS — E1165 Type 2 diabetes mellitus with hyperglycemia: Secondary | ICD-10-CM

## 2018-02-17 DIAGNOSIS — M1A9XX0 Chronic gout, unspecified, without tophus (tophi): Secondary | ICD-10-CM | POA: Diagnosis not present

## 2018-02-17 LAB — BAYER DCA HB A1C WAIVED: HB A1C (BAYER DCA - WAIVED): 6 % (ref <7.0)

## 2018-02-17 MED ORDER — METFORMIN HCL 500 MG PO TABS: 500.0000 mg | ORAL_TABLET | Freq: Two times a day (BID) | ORAL | 3 refills | Status: DC

## 2018-02-17 MED ORDER — KETOROLAC TROMETHAMINE 60 MG/2ML IM SOLN
60.0000 mg | Freq: Once | INTRAMUSCULAR | Status: AC
  Administered 2018-02-17: 60 mg via INTRAMUSCULAR

## 2018-02-17 MED ORDER — ALPRAZOLAM 1 MG PO TABS: ORAL_TABLET | ORAL | 3 refills | Status: DC

## 2018-02-17 MED ORDER — FLUTICASONE PROPIONATE 50 MCG/ACT NA SUSP: 2.0000 | Freq: Every day | NASAL | 5 refills | Status: DC | PRN

## 2018-02-17 MED ORDER — COLCHICINE 0.6 MG PO CAPS: 0.6000 mg | ORAL_CAPSULE | Freq: Three times a day (TID) | ORAL | 3 refills | Status: DC | PRN

## 2018-02-17 NOTE — Progress Notes (Signed)
BP 101/66   Pulse 70   Temp (!) 96.8 F (36 C) (Oral)    Subjective:    Patient ID: [name], male    DOB: [birth year], 53 y.o.   MRN: [id number]  HPI: [name] is a 53 y.o. male presenting on 02/17/2018 for Pain (Patient states he is having pain from neck down to right hip. Patient has been to [facility] due to the pain) and medication discussion (Patient would like to get off of some of his medications if he can )   HPI Patient is coming in to discuss pain is having pain going down from his neck down into his right hip and lower back.  Patient was given a Toradol shot which did help.  Patient has a muscle relaxer that he has been using but he says is not enough and wants to discuss coming in for pain meds.  Patient has a history of seeing pain management and being on high-dose opioids with some opioid addiction issues and some red flags.  Patient does have a pain management specialist and Dr. Merlene Laughter who he sees currently but says that he missed his last appointment with them.  This pain is not new and has been progressive and he says that it has been bothering him for at least a year but is been worse over the past few months.  Patient rates the pain is severe.  He denies any numbness or weakness.  Patient wants to discuss some medication changes during this appointment for his diabetes and hypertension and cholesterol and basically just go over whether or not medicines are necessary and whether he needs a refill.  He had some medications  Relevant past medical, surgical, family and social history reviewed and updated as indicated. Interim medical history since our last visit reviewed. Allergies and medications reviewed and updated.  Review of Systems  Constitutional: Negative for chills and fever.  Eyes: Negative for visual disturbance.  Respiratory: Negative for shortness of breath and wheezing.   Cardiovascular: Negative for chest pain and leg swelling.    Musculoskeletal: Positive for arthralgias, back pain and myalgias. Negative for gait problem.  Skin: Negative for rash.  Neurological: Negative for dizziness, weakness, light-headedness and numbness.  All other systems reviewed and are negative.   Per HPI unless specifically indicated above   Allergies as of 02/17/2018      Reactions   Allopurinol Other (See Comments)   Joint pain worse    Oxycodone Base Itching   Prozac [fluoxetine Hcl] Itching      Medication List       Accurate as of February 17, 2018 11:59 PM. Always use your most recent med list.        albuterol 108 (90 Base) MCG/ACT inhaler Commonly known as:  PROAIR HFA Inhale 2 puffs into the lungs every 6 (six) hours as needed for wheezing or shortness of breath.   ALPRAZolam 1 MG tablet Commonly known as:  XANAX TAKE 1 OR 2 TABLETS BY MOUTH AT BEDTIME   aspirin EC 81 MG tablet Take 81 mg by mouth every 6 (six) hours as needed (CHEST PAIN).   baclofen 10 MG tablet Commonly known as:  LIORESAL Take 1 tablet (10 mg total) by mouth 3 (three) times daily.   betamethasone dipropionate 0.05 % cream Commonly known as:  DIPROLENE APPLY TO AFFECTED AREA TWICE A DAY   carbamazepine 200 MG tablet Commonly known as:  TEGRETOL Take 200 mg by mouth 2 (  two) times daily.   Colchicine 0.6 MG Caps Take 0.6 mg by mouth 3 (three) times daily as needed. TAKE ONE CAPSULE BY MOUTH TWICE DAILY   diltiazem 300 MG 24 hr capsule Commonly known as:  TIAZAC Take 1 Capsule by mouth once daily   diltiazem 300 MG 24 hr capsule Commonly known as:  CARTIA XT Take 1 capsule by mouth daily   diltiazem 90 MG 12 hr capsule Commonly known as:  CARDIZEM SR Take 1 capsule (90 mg total) by mouth daily as needed (palpitations).   doxepin 10 MG capsule Commonly known as:  SINEQUAN TAKE 1 CAPSULE BY MOUTH AT BEDTIME   esomeprazole 40 MG capsule Commonly known as:  NEXIUM Take by mouth.   ezetimibe 10 MG tablet Commonly known as:   ZETIA TAKE 1 TABLET BY MOUTH DAILY   fluticasone 50 MCG/ACT nasal spray Commonly known as:  FLONASE Place 2 sprays into both nostrils daily as needed. For allergies   folic acid 1 MG tablet Commonly known as:  FOLVITE TAKE ONE TABLET BY MOUTH ONCE DAILY - EMERGENCY REFILL FAXED DR.   gabapentin 800 MG tablet Commonly known as:  NEURONTIN TAKE 1 TABLET BY MOUTH THREE TIMES DAILY   glucose blood test strip 1 each by Other route as needed for other. Use as instructed   hydrocortisone 1 % ointment Apply 1 application topically 3 (three) times daily as needed.   ibuprofen 400 MG tablet Commonly known as:  ADVIL,MOTRIN Take 1 tablet (400 mg total) by mouth every 8 (eight) hours as needed. for pain   indomethacin 50 MG capsule Commonly known as:  INDOCIN Take 1 capsule (50 mg total) by mouth 2 (two) times daily with a meal.   ipratropium 0.02 % nebulizer solution Commonly known as:  ATROVENT Take 2.5 mLs (500 mcg total) by nebulization 4 (four) times daily as needed. For wheezing   lactulose 10 GM/15ML solution Commonly known as:  CHRONULAC Take 30 mLs (20 g total) by mouth 2 (two) times daily.   lidocaine 5 % ointment Commonly known as:  XYLOCAINE Apply 1 application topically 4 (four) times daily as needed. To knees and ankle as needed for pain   Magnesium 400 MG Caps Take 400 mg by mouth every morning.   meloxicam 15 MG tablet Commonly known as:  MOBIC   metFORMIN 500 MG tablet Commonly known as:  GLUCOPHAGE Take 1 tablet (500 mg total) by mouth 2 (two) times daily with a meal.   metolazone 2.5 MG tablet Commonly known as:  ZAROXOLYN TAKE 1 TABLET BY MOUTH 1 & 1/2 HOURS PRIOR TO TORSEMIDE FOR 3 DAYS AS NEEDED FOR SEVERE SWELLING   milk thistle 175 MG tablet Take 175 mg by mouth 4 (four) times daily.   MULTI COMPLETE PO Take 1 tablet by mouth daily.   MUSCLE RUB 10-15 % Crea Apply 1 application topically 3 (three) times daily as needed for muscle pain. For  muscle pain   neomycin-bacitracin-polymyxin ointment Commonly known as:  NEOSPORIN Apply 1 application topically every 12 (twelve) hours as needed for wound care. Reported on 06/01/2015   nitroGLYCERIN 0.4 MG SL tablet Commonly known as:  NITROSTAT Place 1 tablet (0.4 mg total) under the tongue every 5 (five) minutes as needed for chest pain (may repeat x3).   potassium chloride SA 20 MEQ tablet Commonly known as:  K-DUR,KLOR-CON TAKE 1 TABLET BY MOUTH TWICE DAILY   propranolol 20 MG tablet Commonly known as:  INDERAL Take 1 tablet (  20 mg total) by mouth daily.   sodium chloride 0.65 % nasal spray Commonly known as:  OCEAN Place into the nose.   torsemide 20 MG tablet Commonly known as:  DEMADEX TAKE 1 TABLET BY MOUTH THREE TIMES DAILY   vitamin B-12 1000 MCG tablet Commonly known as:  CYANOCOBALAMIN Take by mouth.   VOLTAREN 1 % Gel Generic drug:  diclofenac sodium Apply FOUR grams topically FOUR times daily as needed for pain          Objective:    BP 101/66   Pulse 70   Temp (!) 96.8 F (36 C) (Oral)   Wt Readings from Last 3 Encounters:  09/11/17 (!) 374 lb 6.4 oz (169.8 kg)  06/21/17 (!) 386 lb (175.1 kg)  05/10/17 (!) 380 lb 4.8 oz (172.5 kg)    Physical Exam Vitals signs and nursing note reviewed.  Constitutional:      General: He is not in acute distress.    Appearance: He is well-developed. He is not diaphoretic.  Eyes:     General: No scleral icterus.       Right eye: No discharge.     Conjunctiva/sclera: Conjunctivae normal.     Pupils: Pupils are equal, round, and reactive to light.  Neck:     Musculoskeletal: Neck supple.     Thyroid: No thyromegaly.  Cardiovascular:     Rate and Rhythm: Normal rate and regular rhythm.     Heart sounds: Normal heart sounds. No murmur.  Pulmonary:     Effort: Pulmonary effort is normal. No respiratory distress.     Breath sounds: Normal breath sounds. No wheezing.  Musculoskeletal: Normal range of motion.      Right hip: He exhibits tenderness. He exhibits normal range of motion and normal strength.     Lumbar back: He exhibits tenderness.  Lymphadenopathy:     Cervical: No cervical adenopathy.  Skin:    General: Skin is warm and dry.     Findings: No rash.  Neurological:     Mental Status: He is alert and oriented to person, place, and time.     Coordination: Coordination normal.  Psychiatric:        Behavior: Behavior normal.        Assessment & Plan:   Problem List Items Addressed This Visit      Digestive   Cirrhosis ([place])   Relevant Orders   CBC with Differential/Platelet (Completed)   CMP14+EGFR (Completed)     Endocrine   T2DM (type 2 diabetes mellitus) ([place]) - Primary   Relevant Medications   metFORMIN (GLUCOPHAGE) 500 MG tablet   Other Relevant Orders   Bayer DCA Hb A1c Waived (Completed)   Hyperlipidemia associated with type 2 diabetes mellitus (HCC)   Relevant Medications   metFORMIN (GLUCOPHAGE) 500 MG tablet   Other Relevant Orders   Lipid panel (Completed)     Other   Gout   Relevant Medications   Colchicine 0.6 MG CAPS   ketorolac (TORADOL) injection 60 mg (Completed)      Continue metformin and will give colchicine and a Toradol shot for back pain and leg pain which he attributes to gout but seems like it is more osteoarthritis. Follow up plan: Return if symptoms worsen or fail to improve.  Counseling provided for all of the vaccine components Orders Placed This Encounter  Procedures  . Bayer DCA Hb A1c Waived  . CBC with Differential/Platelet  . CMP14+EGFR  . Lipid panel  Caryl Pina, MD [facility] 02/23/2018, 11:13 AM

## 2018-02-18 LAB — CMP14+EGFR
ALT: 28 IU/L (ref 0–44)
AST: 34 IU/L (ref 0–40)
Albumin/Globulin Ratio: 1.2 (ref 1.2–2.2)
Albumin: 3.6 g/dL — ABNORMAL LOW (ref 3.8–4.9)
Alkaline Phosphatase: 135 IU/L — ABNORMAL HIGH (ref 39–117)
BUN/Creatinine Ratio: 19 (ref 9–20)
BUN: 18 mg/dL (ref 6–24)
Bilirubin Total: 1.8 mg/dL — ABNORMAL HIGH (ref 0.0–1.2)
CO2: 16 mmol/L — ABNORMAL LOW (ref 20–29)
Calcium: 9 mg/dL (ref 8.7–10.2)
Chloride: 98 mmol/L (ref 96–106)
Creatinine, Ser: 0.94 mg/dL (ref 0.76–1.27)
GFR calc Af Amer: 106 mL/min/{1.73_m2} (ref >59)
GFR calc non Af Amer: 92 mL/min/{1.73_m2} (ref >59)
GLUCOSE: 250 mg/dL — AB (ref 65–99)
Globulin, Total: 2.9 g/dL (ref 1.5–4.5)
Potassium: 5 mmol/L (ref 3.5–5.2)
Sodium: 132 mmol/L — ABNORMAL LOW (ref 134–144)
Total Protein: 6.5 g/dL (ref 6.0–8.5)

## 2018-02-18 LAB — CBC WITH DIFFERENTIAL/PLATELET
Basophils Absolute: 0 10*3/uL (ref 0.0–0.2)
Basos: 0 %
EOS (ABSOLUTE): 0 10*3/uL (ref 0.0–0.4)
Eos: 0 %
Hematocrit: 39.7 % (ref 37.5–51.0)
Hemoglobin: 13.3 g/dL (ref 13.0–17.7)
IMMATURE GRANULOCYTES: 1 %
Immature Grans (Abs): 0.1 10*3/uL (ref 0.0–0.1)
Lymphocytes Absolute: 0.8 10*3/uL (ref 0.7–3.1)
Lymphs: 7 %
MCH: 29.9 pg (ref 26.6–33.0)
MCHC: 33.5 g/dL (ref 31.5–35.7)
MCV: 89 fL (ref 79–97)
Monocytes Absolute: 1.2 10*3/uL — ABNORMAL HIGH (ref 0.1–0.9)
Monocytes: 11 %
NEUTROS PCT: 81 %
Neutrophils Absolute: 9.5 10*3/uL — ABNORMAL HIGH (ref 1.4–7.0)
Platelets: 91 10*3/uL — CL (ref 150–450)
RBC: 4.45 x10E6/uL (ref 4.14–5.80)
RDW: 13.8 % (ref 11.6–15.4)
WBC: 11.6 10*3/uL — ABNORMAL HIGH (ref 3.4–10.8)

## 2018-02-18 LAB — LIPID PANEL
Chol/HDL Ratio: 3.9 ratio (ref 0.0–5.0)
Cholesterol, Total: 154 mg/dL (ref 100–199)
HDL: 40 mg/dL (ref >39)
LDL Calculated: 84 mg/dL (ref 0–99)
TRIGLYCERIDES: 149 mg/dL (ref 0–149)
VLDL CHOLESTEROL CAL: 30 mg/dL (ref 5–40)

## 2018-02-19 ENCOUNTER — Telehealth: Payer: Self-pay | Admitting: Family Medicine

## 2018-02-19 ENCOUNTER — Telehealth: Payer: Self-pay | Admitting: *Deleted

## 2018-02-19 NOTE — Telephone Encounter (Signed)
Patient states that Hip was hurting real bad yesterday evening when he got home and now has bilateral hip pain with upper leg weakness and knee pain and arm weakness. No headache, blurred vision, or slurred speech

## 2018-02-19 NOTE — Telephone Encounter (Signed)
Patient aware of dr. Merita Norton recommendations

## 2018-02-19 NOTE — Telephone Encounter (Signed)
Yes and I recommended for him to go see the pain management doctor again, he is in pain chronically and he needs to go back and see them.

## 2018-02-20 ENCOUNTER — Other Ambulatory Visit: Payer: Self-pay | Admitting: Family Medicine

## 2018-02-20 NOTE — Telephone Encounter (Signed)
Last seen 02/17/18  Dr D.

## 2018-02-21 ENCOUNTER — Telehealth: Payer: Self-pay | Admitting: Family Medicine

## 2018-02-21 NOTE — Telephone Encounter (Signed)
Informed patient that phone number is [phone]

## 2018-03-01 ENCOUNTER — Telehealth: Payer: Self-pay | Admitting: Family Medicine

## 2018-03-03 NOTE — Telephone Encounter (Signed)
Patient states that he discussed with Dr. Warrick Parisian about stopping some medications. Patient wanting to know what medications he can come off of

## 2018-03-03 NOTE — Telephone Encounter (Signed)
Printed his AVS for him from our last visit, we did discuss and I dropped some of the medications off his list from that AVS.

## 2018-03-03 NOTE — Telephone Encounter (Signed)
Patient aware.

## 2018-03-04 ENCOUNTER — Other Ambulatory Visit: Payer: Self-pay | Admitting: Family Medicine

## 2018-03-04 NOTE — Telephone Encounter (Signed)
PT states that he is wanting to talk to nurse about his medications, wants to know which ones he is no longer needing so he can tell the pharmacy, states that at last visit what we gave him didn't have the ones marked off he no longer needs.

## 2018-03-04 NOTE — Telephone Encounter (Signed)
Went through with patient per his last office visit what medications he should be on and what ones where d/c. Patient would like to know if we can send a refill of Baclofen to pharmacy? Patient also would like to let Dr. Warrick Parisian know that he wants to d/c zetia and see what his next cholesterol check is. Patient also would like to just use gabapentin PRN.

## 2018-03-05 DIAGNOSIS — M25571 Pain in right ankle and joints of right foot: Secondary | ICD-10-CM | POA: Diagnosis not present

## 2018-03-05 DIAGNOSIS — M170 Bilateral primary osteoarthritis of knee: Secondary | ICD-10-CM | POA: Diagnosis not present

## 2018-03-05 DIAGNOSIS — M25562 Pain in left knee: Secondary | ICD-10-CM | POA: Diagnosis not present

## 2018-03-05 DIAGNOSIS — M25461 Effusion, right knee: Secondary | ICD-10-CM | POA: Diagnosis not present

## 2018-03-05 DIAGNOSIS — M25561 Pain in right knee: Secondary | ICD-10-CM | POA: Diagnosis not present

## 2018-03-05 MED ORDER — BACLOFEN 10 MG PO TABS: 10.0000 mg | ORAL_TABLET | Freq: Three times a day (TID) | ORAL | 0 refills | Status: DC

## 2018-03-05 NOTE — Telephone Encounter (Signed)
Yes I am fine with trying to come off the Zetia and using gabapentin as needed, I will send a refill for baclofen.

## 2018-03-10 ENCOUNTER — Telehealth: Payer: Self-pay | Admitting: Family Medicine

## 2018-03-10 IMAGING — CT CT BIOPSY
3 of 4 series · 14 of 32 positions shown, 18 images · non-contrast
Comparison: none

INDICATION: 50-year-old male with leukopenia and thrombocytopenia.

[Series 2: i-spiral 5.0 b70f · axial · 0.98mm/px · z∈[-169,-64]mm · 8 of 39 slices shown (1 of 2)]
[im 5/39  soft-tissue]
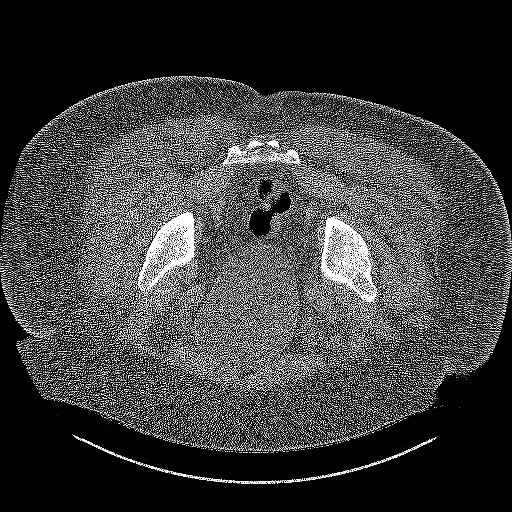
[im 9/39  soft-tissue]
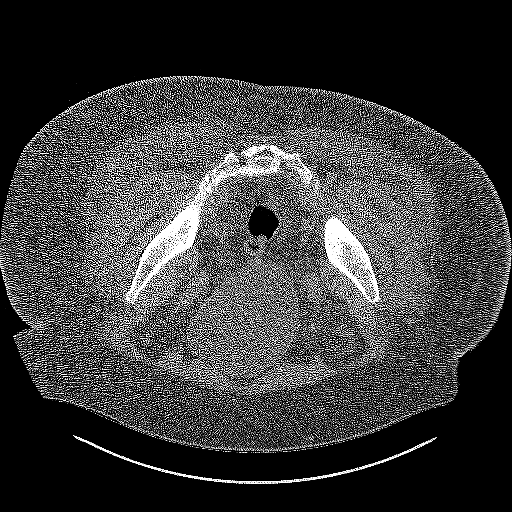
[im 13/39  soft-tissue]
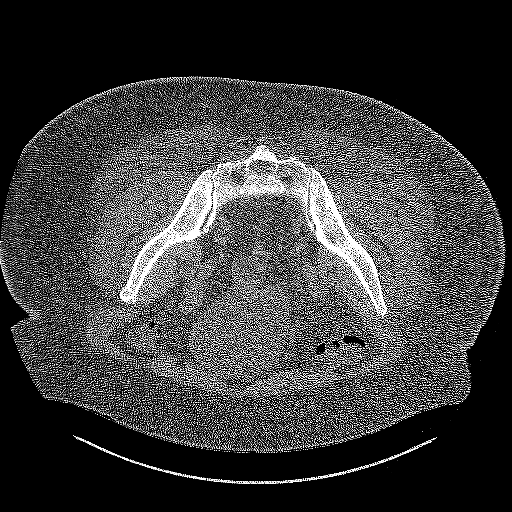
[im 17/39  soft-tissue]
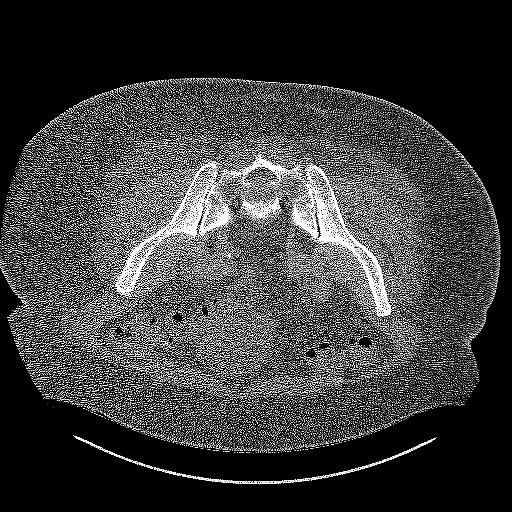
[im 23/39  soft-tissue]
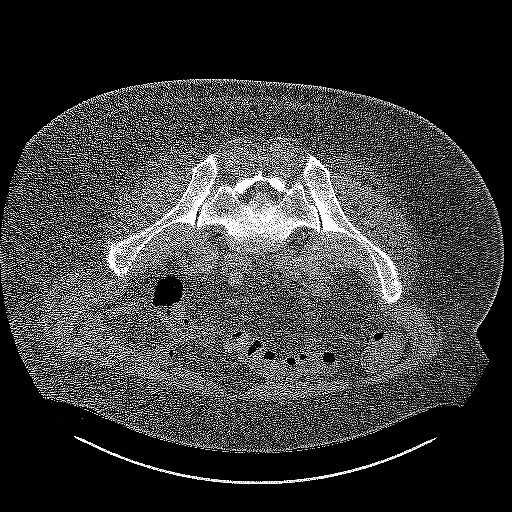
[im 27/39  soft-tissue]
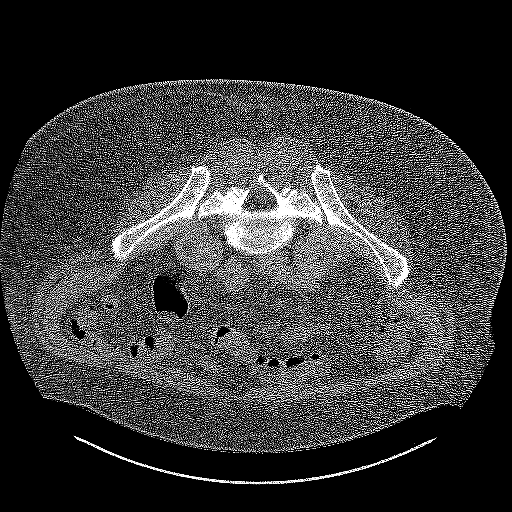
[im 31/39  soft-tissue]
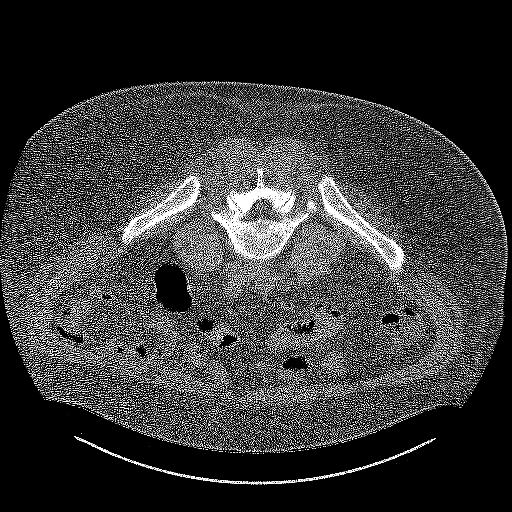
[im 35/39  soft-tissue]
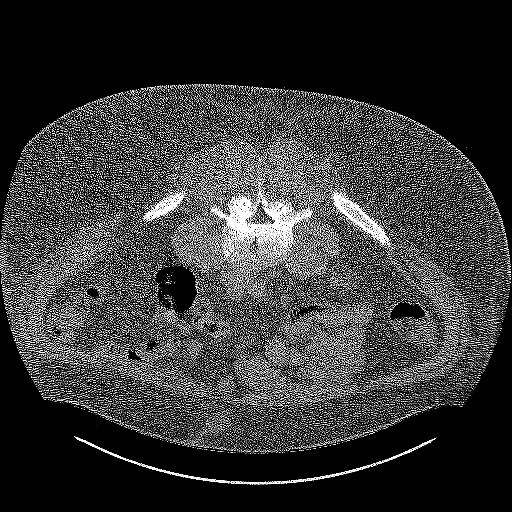

[Series 4: i-spiral 5.0 b70f · axial · 0.98mm/px · z∈[-115,-101]mm · 3 of 9 slices shown (2 of 2)]
[im 3/9  soft-tissue]
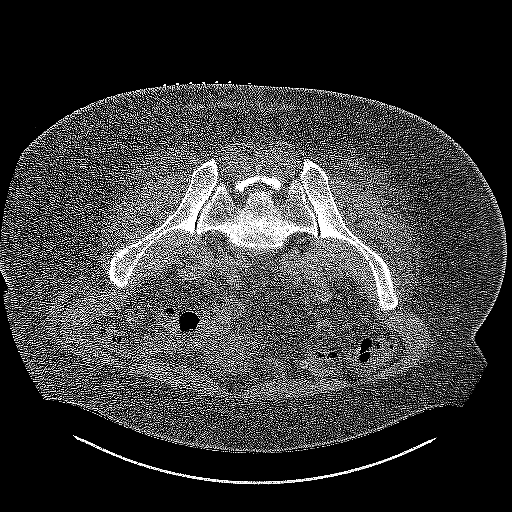
[im 5/9  soft-tissue]
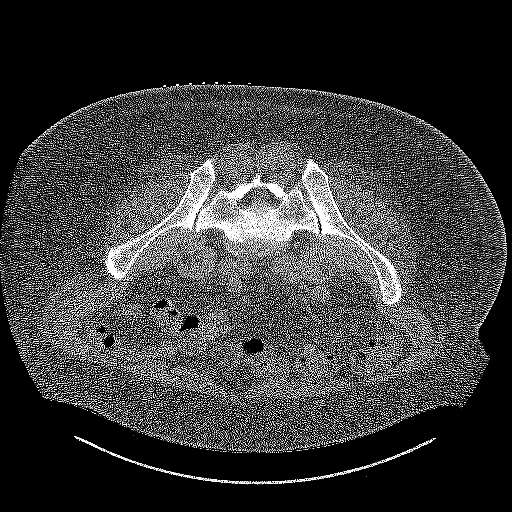
[im 7/9  soft-tissue]
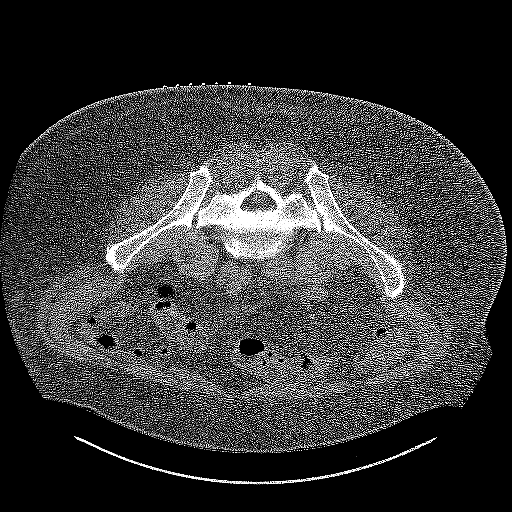

[Series 5: i-sequence 4.8 b70s · axial · 0.98mm/px · z∈[-106,-102]mm · 3 of 9 slices shown, 7 images]
[im 3/9  soft-tissue]
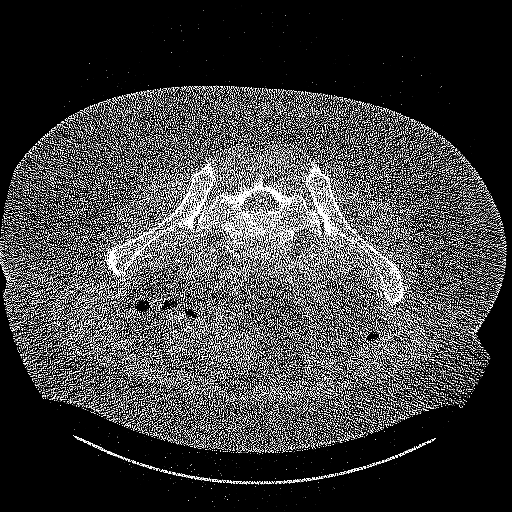
[im 3/9  lung]
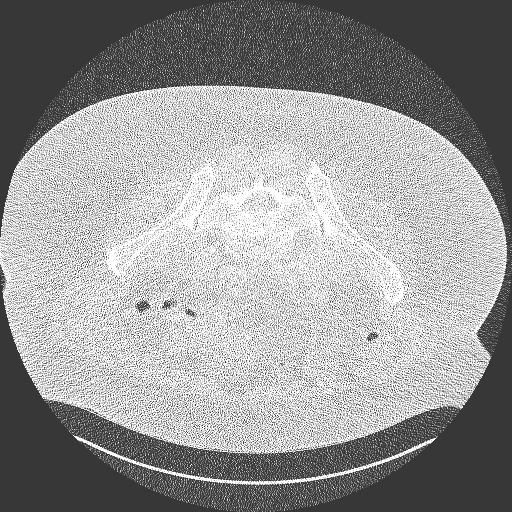
[im 3/9  bone]
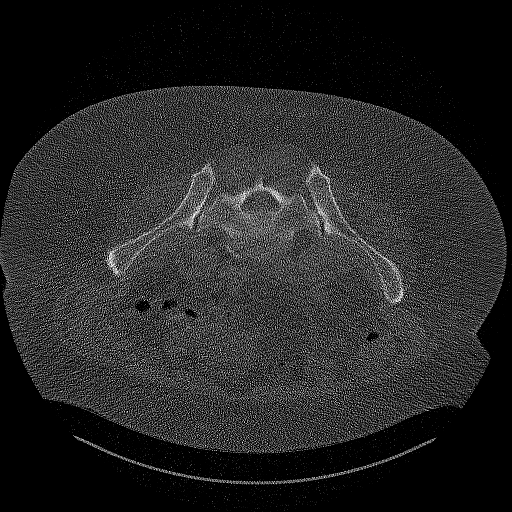
[im 5/9  soft-tissue]
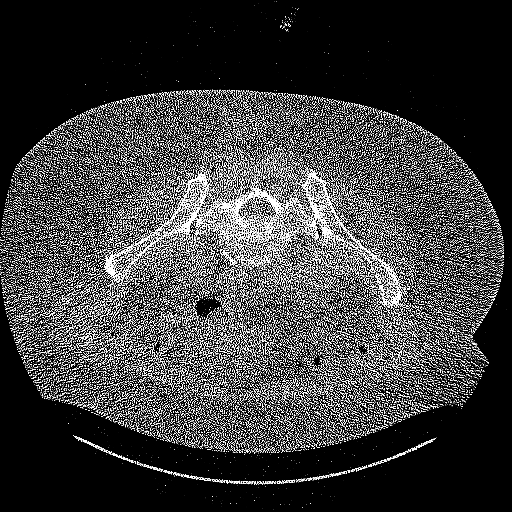
[im 5/9  lung]
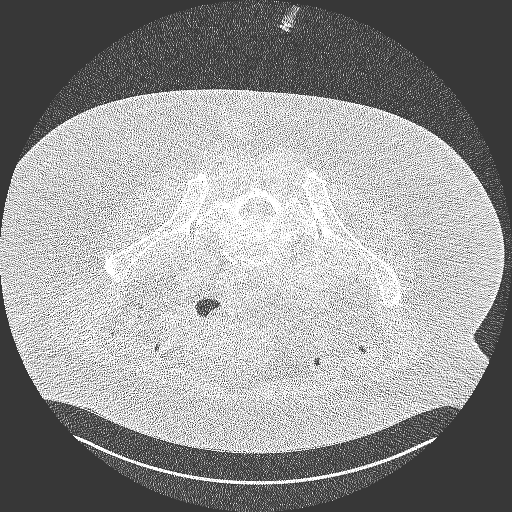
[im 7/9  soft-tissue]
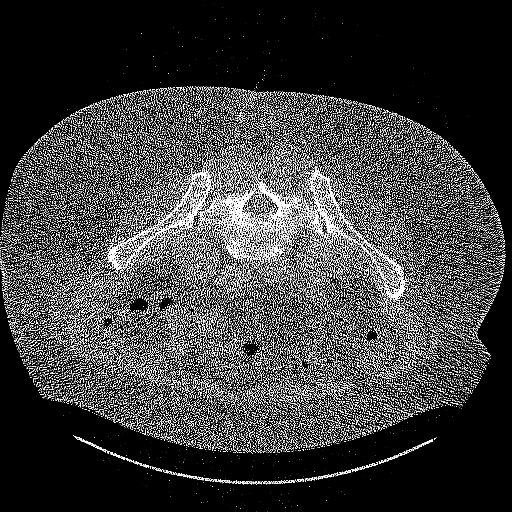
[im 7/9  lung]
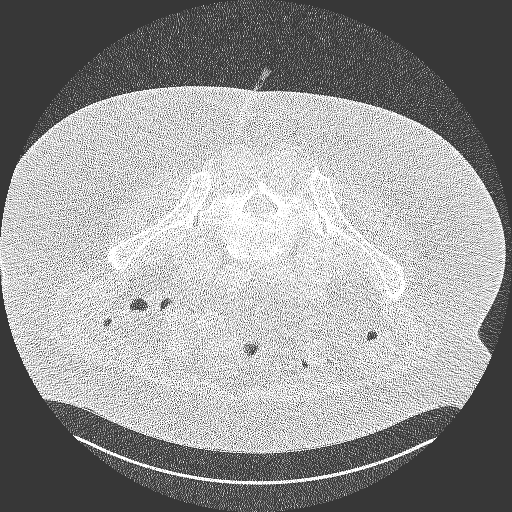

[14 of 32 positions shown; findings below may reference images not displayed]

EXAM:
CT GUIDED BONE MARROW ASPIRATION AND CORE BIOPSY

MEDICATIONS:
None.

ANESTHESIA/SEDATION:
Moderate (conscious) sedation was employed during this procedure. A
total of 8 milligrams versed and 200 micrograms fentanyl were
administered intravenously. The patient's level of consciousness and
vital signs were monitored continuously by radiology nursing
throughout the procedure under my direct supervision.

Total monitored sedation time: 19 minutes

FLUOROSCOPY TIME:  Fluoroscopy Time: 0 minutes 0 seconds (0 mGy).

COMPLICATIONS:
None immediate.

Estimated blood loss: <25 mL

PROCEDURE:
Informed written consent was obtained from the patient after a
thorough discussion of the procedural risks, benefits and
alternatives. All questions were addressed. Maximal Sterile Barrier
Technique was utilized including caps, mask, sterile gowns, sterile
gloves, sterile drape, hand hygiene and skin antiseptic. A timeout
was performed prior to the initiation of the procedure.

The patient was positioned prone and non-contrast localization CT
was performed of the pelvis to demonstrate the iliac marrow spaces.

Maximal barrier sterile technique utilized including caps, mask,
sterile gowns, sterile gloves, large sterile drape, hand hygiene,
and betadine prep.

Under sterile conditions and local anesthesia, an 11 gauge coaxial
bone biopsy needle was advanced into the right iliac marrow space.
Needle position was confirmed with CT imaging. Initially, bone
marrow aspiration was performed. Next, the 11 gauge outer cannula
was utilized to obtain a right iliac bone marrow core biopsy. Needle
was removed. Hemostasis was obtained with compression. The patient
tolerated the procedure well. Samples were prepared with the
cytotechnologist.
IMPRESSION: Technically successful CT-guided bone marrow biopsy.

## 2018-03-10 NOTE — Telephone Encounter (Signed)
Patient states that Neuropathy in bilateral feet have worsened and would like to know if there is something different he can take other than gabapentin or if his gabapentin could be increased.

## 2018-03-11 NOTE — Telephone Encounter (Signed)
Tell him that he can go ahead and take his 800 mg 4 times a day instead of a new prescription of the gabapentin 804 times a day

## 2018-03-17 NOTE — Telephone Encounter (Signed)
Aware. 

## 2018-03-20 ENCOUNTER — Other Ambulatory Visit: Payer: Self-pay | Admitting: Family Medicine

## 2018-03-30 ENCOUNTER — Other Ambulatory Visit: Payer: Self-pay | Admitting: Family Medicine

## 2018-04-01 ENCOUNTER — Telehealth: Payer: Self-pay | Admitting: Family Medicine

## 2018-04-01 NOTE — Telephone Encounter (Signed)
Discussed with pt = virt OV with DETT scheduled for tomorrow

## 2018-04-02 ENCOUNTER — Other Ambulatory Visit: Payer: Self-pay

## 2018-04-02 ENCOUNTER — Telehealth (INDEPENDENT_AMBULATORY_CARE_PROVIDER_SITE_OTHER): Payer: Medicare HMO | Admitting: Family Medicine

## 2018-04-02 DIAGNOSIS — M792 Neuralgia and neuritis, unspecified: Secondary | ICD-10-CM | POA: Diagnosis not present

## 2018-04-02 DIAGNOSIS — R195 Other fecal abnormalities: Secondary | ICD-10-CM | POA: Diagnosis not present

## 2018-04-02 MED ORDER — FLUTICASONE PROPIONATE 50 MCG/ACT NA SUSP: 2.0000 | Freq: Every day | NASAL | 5 refills | Status: DC | PRN

## 2018-04-02 MED ORDER — GABAPENTIN 800 MG PO TABS: 800.0000 mg | ORAL_TABLET | Freq: Four times a day (QID) | ORAL | 6 refills | Status: DC

## 2018-04-02 NOTE — Progress Notes (Signed)
Virtual Visit via telephone Note  I connected with [name] on 04/02/18 at 1356 by telephone and verified that I am speaking with the correct person using two identifiers. [name] is currently located at home and No other people are currently with her during visit. The provider, Fransisca Kaufmann Dettinger, MD is located in their office at time of visit.  Call ended at 1410  I discussed the limitations, risks, security and privacy concerns of performing an evaluation and management service by telephone and the availability of in person appointments. I also discussed with the patient that there may be a patient responsible charge related to this service. The patient expressed understanding and agreed to proceed.   History and Present Illness:  loose bowel movements have been causing him problems for months.  He has urgency to go and has to go quickly or else he might have an accident.  He is concerned if any of his medications might be causing this, he has been on lactulose although he has backed off on it over the past couple days to see if that can help.  It does also look like he is on a high dose of magnesium so we will consider holding off on that for now and will call his pharmacy and discontinue it for now.  He is also been taking the metformin and it looks like his A1c is 6.0 and his blood sugars are running good we will also back off on the metformin for now and see if that can help and watch closely on his blood sugars.  He says right now the running in the 130s and 150s and he has not been taking the metformin.  No diagnosis found.  Outpatient Encounter Medications as of 04/02/2018  Medication Sig  . albuterol (PROAIR HFA) 108 (90 Base) MCG/ACT inhaler Inhale 2 puffs into the lungs every 6 (six) hours as needed for wheezing or shortness of breath.  . ALPRAZolam (XANAX) 1 MG tablet TAKE 1 OR 2 TABLETS BY MOUTH AT BEDTIME  . aspirin EC 81 MG tablet Take 81 mg by mouth every 6  (six) hours as needed (CHEST PAIN).  Marland Kitchen baclofen (LIORESAL) 10 MG tablet Take 1 tablet (10 mg total) by mouth 3 (three) times daily.  . betamethasone dipropionate (DIPROLENE) 0.05 % cream APPLY TO AFFECTED AREA TWICE A DAY (Patient taking differently: 1 application to legs once a day as needed for itching.)  . buPROPion (WELLBUTRIN XL) 300 MG 24 hr tablet TAKE 1 TABLET BY MOUTH EVERY DAY  . carbamazepine (TEGRETOL) 200 MG tablet Take 200 mg by mouth 2 (two) times daily.  . Colchicine 0.6 MG CAPS Take 0.6 mg by mouth 3 (three) times daily as needed. TAKE ONE CAPSULE BY MOUTH TWICE DAILY  . diltiazem (CARDIZEM SR) 90 MG 12 hr capsule Take 1 capsule (90 mg total) by mouth daily as needed (palpitations).  Marland Kitchen diltiazem (CARTIA XT) 300 MG 24 hr capsule Take 1 capsule by mouth daily  . diltiazem (TIAZAC) 300 MG 24 hr capsule Take 1 Capsule by mouth once daily  . doxepin (SINEQUAN) 10 MG capsule TAKE 1 CAPSULE BY MOUTH AT BEDTIME  . esomeprazole (NEXIUM) 40 MG capsule Take by mouth.  . ezetimibe (ZETIA) 10 MG tablet TAKE 1 TABLET BY MOUTH EVERY DAY  . fluticasone (FLONASE) 50 MCG/ACT nasal spray Place 2 sprays into both nostrils daily as needed. For allergies  . folic acid (FOLVITE) 1 MG tablet TAKE ONE TABLET BY  MOUTH ONCE DAILY - EMERGENCY REFILL FAXED DR.  Marland Kitchen gabapentin (NEURONTIN) 800 MG tablet TAKE 1 TABLET BY MOUTH THREE TIMES DAILY  . glucose blood test strip 1 each by Other route as needed for other. Use as instructed  . hydrocortisone 1 % ointment Apply 1 application topically 3 (three) times daily as needed. (Patient taking differently: Apply 1 application topically 3 (three) times daily as needed for itching ("leg pain"). )  . ibuprofen (ADVIL,MOTRIN) 400 MG tablet Take 1 tablet (400 mg total) by mouth every 8 (eight) hours as needed. for pain  . indomethacin (INDOCIN) 50 MG capsule Take 1 capsule (50 mg total) by mouth 2 (two) times daily with a meal.  . ipratropium (ATROVENT) 0.02 % nebulizer  solution Take 2.5 mLs (500 mcg total) by nebulization 4 (four) times daily as needed. For wheezing (Patient taking differently: Take 500 mcg by nebulization 4 (four) times daily as needed for wheezing or shortness of breath. For wheezing)  . lactulose (CHRONULAC) 10 GM/15ML solution Take 30 mLs (20 g total) by mouth 2 (two) times daily.  Marland Kitchen lidocaine (XYLOCAINE) 5 % ointment Apply 1 application topically 4 (four) times daily as needed. To knees and ankle as needed for pain  . Magnesium 400 MG CAPS Take 400 mg by mouth every morning.  . meloxicam (MOBIC) 15 MG tablet   . Menthol-Methyl Salicylate (MUSCLE RUB) 10-15 % CREA Apply 1 application topically 3 (three) times daily as needed for muscle pain. For muscle pain   . metFORMIN (GLUCOPHAGE) 500 MG tablet Take 1 tablet (500 mg total) by mouth 2 (two) times daily with a meal.  . metolazone (ZAROXOLYN) 2.5 MG tablet TAKE 1 TABLET BY MOUTH 1 & 1/2 HOURS PRIOR TO TORSEMIDE FOR 3 DAYS AS NEEDED FOR SEVERE SWELLING  . milk thistle 175 MG tablet Take 175 mg by mouth 4 (four) times daily.   . Multiple Vitamins-Minerals (MULTI COMPLETE PO) Take 1 tablet by mouth daily.   Marland Kitchen neomycin-bacitracin-polymyxin (NEOSPORIN) ointment Apply 1 application topically every 12 (twelve) hours as needed for wound care. Reported on 06/01/2015  . nitroGLYCERIN (NITROSTAT) 0.4 MG SL tablet Place 1 tablet (0.4 mg total) under the tongue every 5 (five) minutes as needed for chest pain (may repeat x3).  . potassium chloride SA (K-DUR,KLOR-CON) 20 MEQ tablet TAKE 1 TABLET BY MOUTH TWICE DAILY  . propranolol (INDERAL) 20 MG tablet Take 1 tablet (20 mg total) by mouth daily.  . sodium chloride (OCEAN) 0.65 % nasal spray Place into the nose.  . torsemide (DEMADEX) 20 MG tablet TAKE 1 TABLET BY MOUTH THREE TIMES DAILY  . vitamin B-12 (CYANOCOBALAMIN) 1000 MCG tablet Take by mouth.  . VOLTAREN 1 % GEL Apply FOUR grams topically FOUR times daily as needed for pain   No  facility-administered encounter medications on file as of 04/02/2018.     Review of Systems  Constitutional: Negative for chills and fever.  Eyes: Negative for visual disturbance.  Respiratory: Negative for shortness of breath and wheezing.   Cardiovascular: Negative for chest pain and leg swelling.  Gastrointestinal: Positive for diarrhea. Negative for abdominal pain, blood in stool, constipation, nausea and vomiting.  Musculoskeletal: Negative for back pain and gait problem.  Skin: Negative for rash.  Neurological: Positive for numbness. Negative for dizziness, weakness and light-headedness.  All other systems reviewed and are negative.   Observations/Objective: Patient sounds comfortable and does not sound any distress  Assessment and Plan: Problem List Items Addressed This Visit  None    Visit Diagnoses    Loose bowel movements    -  Primary       Follow Up Instructions:  Will stop magnesium because of loose bowel movements   Stop metformin for now  Patient requested gabapentin be increased to 4 times a day because he states the neuropathic pain, will increase it for him.  I discussed the assessment and treatment plan with the patient. The patient was provided an opportunity to ask questions and all were answered. The patient agreed with the plan and demonstrated an understanding of the instructions.   The patient was advised to call back or seek an in-person evaluation if the symptoms worsen or if the condition fails to improve as anticipated.  The above assessment and management plan was discussed with the patient. The patient verbalized understanding of and has agreed to the management plan. Patient is aware to call the clinic if symptoms persist or worsen. Patient is aware when to return to the clinic for a follow-up visit. Patient educated on when it is appropriate to go to the emergency department.    I provided 15 minutes of non-face-to-face time during this  encounter.    Worthy Rancher, MD

## 2018-04-03 ENCOUNTER — Telehealth: Payer: Self-pay | Admitting: Family Medicine

## 2018-04-03 MED ORDER — TORSEMIDE 20 MG PO TABS: ORAL_TABLET | ORAL | 0 refills | Status: DC

## 2018-04-03 NOTE — Telephone Encounter (Signed)
Meds sent to pharmacy patient aware

## 2018-04-03 NOTE — Telephone Encounter (Signed)
PT is wanting to talk to nurse about his torsemide (DEMADEX) 20 MG tablet use to take 2 in morning and 1 at lunch and was changed to one 3 times daily, pt is wanting to know if he can go back to 2 in morning 1 at lunch. He is on crutches and having hard time getting to bathroom. Pt would also like to know if something could be changed at pharmacy about his gabapentin (NEURONTIN) 800 MG tablet states that he was changed to up to 4 times daily but pharmacy has it as 3 times daily  Pharmacy: [facility]

## 2018-04-03 NOTE — Telephone Encounter (Signed)
Yes go ahead and change the torsemide to that so he can get packaged correctly, I did send a new prescription yesterday for the gabapentin 4 times a day to the pharmacy so they should have it.

## 2018-04-08 ENCOUNTER — Other Ambulatory Visit: Payer: Self-pay | Admitting: Family Medicine

## 2018-04-10 ENCOUNTER — Telehealth: Payer: Self-pay | Admitting: Family Medicine

## 2018-04-10 MED ORDER — BACLOFEN 10 MG PO TABS: 10.0000 mg | ORAL_TABLET | Freq: Three times a day (TID) | ORAL | 3 refills | Status: DC

## 2018-04-10 NOTE — Telephone Encounter (Signed)
Pt aware.

## 2018-04-15 ENCOUNTER — Telehealth: Payer: Self-pay | Admitting: Family Medicine
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
## 2018-04-15 NOTE — Telephone Encounter (Signed)
Covering PCP- please advise  

## 2018-04-16 ENCOUNTER — Telehealth: Payer: Self-pay | Admitting: *Deleted
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
## 2018-04-16 NOTE — Telephone Encounter (Signed)
LM - aware to call and schedule a "in office appointment" jhb 04/16/18.

## 2018-04-16 NOTE — Telephone Encounter (Signed)
The patient verbally consented for a telehealth visit on 04/23/2018 with [facility] and understands that her insurance company will be billed for the encounter.

## 2018-04-17 ENCOUNTER — Telehealth: Payer: Self-pay | Admitting: Family Medicine

## 2018-04-17 DIAGNOSIS — M1A9XX0 Chronic gout, unspecified, without tophus (tophi): Secondary | ICD-10-CM

## 2018-04-17 MED ORDER — INDOMETHACIN 50 MG PO CAPS: 50.0000 mg | ORAL_CAPSULE | Freq: Three times a day (TID) | ORAL | 3 refills | Status: DC

## 2018-04-17 NOTE — Telephone Encounter (Signed)
I sent the indomethacin for the patient

## 2018-04-17 NOTE — Telephone Encounter (Signed)
Pt aware.

## 2018-04-22 ENCOUNTER — Telehealth: Payer: Self-pay | Admitting: Cardiology

## 2018-04-22 NOTE — Telephone Encounter (Signed)
Spoke with patient who feels more comfortable with Doximity or Dox.me as opposed to [employer].  He doesn't feel as though that will work for him.  Reassurance given.  He is aware he will be call before his appt to review VS, wt, and medications.  He had no further questions.

## 2018-04-22 NOTE — Progress Notes (Signed)
Virtual Visit via Video Note   This visit type was conducted due to national recommendations for restrictions regarding the COVID-19 Pandemic (e.g. social distancing) in an effort to limit this patient's exposure and mitigate transmission in our community.  Due to his co-morbid illnesses, this patient is at least at moderate risk for complications without adequate follow up.  This format is felt to be most appropriate for this patient at this time.  All issues noted in this document were discussed and addressed.  A limited physical exam was performed with this format.  Please refer to the patient's chart for his consent to telehealth for [facility].  We attempted video conference but he was unable to get his video working on his phone.     Evaluation Performed:  Follow-up visit  Date:  04/22/2018   ID:  [name], DOB 10-May-1965, MRN [id number]  Patient Location: Home Provider Location: Home  PCP:  Dettinger, Fransisca Kaufmann, MD  Cardiologist:  .Minus Breeding, MD [facility] Electrophysiologist:  None   Chief Complaint:  SOB  History of Present Illness:    [name] is a 53 y.o. male who presents for follow up of diastolic heart failure and a history of atrial fibrillation.  Since I last saw him he has done well.  He says that his knee has been hurting and that he has been unable to be very active.  The patient denies any new symptoms such as chest discomfort, neck or arm discomfort. There has been no new shortness of breath, PND or orthopnea. There have been no reported palpitations, presyncope or syncope.  He was losing weight but now has gained this back.    The patient does not have symptoms concerning for COVID-19 infection (fever, chills, cough, or new shortness of breath).    Past Medical History:  Diagnosis Date  . Anxiety   . Atrial fibrillation ([place])   . CHF (congestive heart failure) ([place])   . Cirrhosis ([place])    NASH  . Constipation   . COPD (chronic obstructive  pulmonary disease) ([place])   . Depression, recurrent ([place]) 07/18/2016  . Diabetes mellitus    type 2  . Genu varus   . GERD (gastroesophageal reflux disease)   . Gout   . Heart murmur   . Hypertension associated with diabetes ([place])   . Leukopenia 07/08/2015  . Neuromuscular disorder (H[facility])    neuropathy in hands and feet  . Psoriasis   . RA (rheumatoid arthritis) ([place])   . Sleep apnea    cpap used- level 10 and greater  . Thrombocytopenia ([place])    Past Surgical History:  Procedure Laterality Date  . ANKLE SURGERY     right ankle talor repair  . CHOLECYSTECTOMY  sept 2016  . CHOLECYSTECTOMY    . COLONOSCOPY  05/08/2011   Procedure: COLONOSCOPY;  Surgeon: Rogene Houston, MD;  Location: [facility] ENDO SU[facility];  Service: Endoscopy;  Laterality: N/A;  730  . ESOPHAGOGASTRODUODENOSCOPY  02/28/2011   Procedure: ESOPHAGOGASTRODUODENOSCOPY (EGD);  Surgeon: Rogene Houston, MD;  Location: [facility] ENDO SUITE;  Service: Endoscopy;  Laterality: N/A;  1200  . ESOPHAGOGASTRODUODENOSCOPY N/A 06/11/2012   Procedure: ESOPHAGOGASTRODUODENOSCOPY (EGD);  Surgeon: Rogene Houston, MD;  Location: [facility] ENDO SUITE;  Service: Endoscopy;  Laterality: N/A;  1200  FYI patient is 400 pounds  . ESOPHAGOGASTRODUODENOSCOPY (EGD) WITH PROPOFOL N/A 04/21/2014   Procedure: ESOPHAGOGASTRODUODENOSCOPY (EGD) WITH PROPOFOL;  Surgeon: Rogene Houston, MD;  Location: [facility] ORS;  Service: Endoscopy;  Laterality: N/A;  . HERNIA REPAIR Right    as child; inguinal  . HERNIA REPAIR  sept 2016  . LIVER BIOPSY  2012  . SCIATIC NERVE EXPLORATION    . TONSILLECTOMY      Prior to Admission medications   Medication Sig Start Date End Date Taking? Authorizing Provider  albuterol (PROAIR HFA) 108 (90 Base) MCG/ACT inhaler Inhale 2 puffs into the lungs every 6 (six) hours as needed for wheezing or shortness of breath. 07/07/15  Yes Timmothy Euler, MD  ALPRAZolam Duanne Moron) 1 MG tablet TAKE 1 OR 2 TABLETS BY MOUTH AT BEDTIME 02/17/18  Yes Dettinger, Fransisca Kaufmann,  MD  aspirin EC 81 MG tablet Take 81 mg by mouth every 6 (six) hours as needed (CHEST PAIN).   Yes [provider]  baclofen (LIORESAL) 10 MG tablet Take 1 tablet (10 mg total) by mouth 3 (three) times daily. 04/10/18  Yes Dettinger, Fransisca Kaufmann, MD  betamethasone dipropionate (DIPROLENE) 0.05 % cream APPLY TO AFFECTED AREA TWICE A DAY Patient taking differently: 1 application to legs once a day as needed for itching. 01/22/14  Yes Lysbeth Penner, FNP  buPROPion (WELLBUTRIN XL) 300 MG 24 hr tablet TAKE 1 TABLET BY MOUTH EVERY DAY 02/20/18  Yes Dettinger, Fransisca Kaufmann, MD  Colchicine 0.6 MG CAPS Take 0.6 mg by mouth 3 (three) times daily as needed. TAKE ONE CAPSULE BY MOUTH TWICE DAILY 02/17/18  Yes Dettinger, Fransisca Kaufmann, MD  diltiazem (CARDIZEM SR) 90 MG 12 hr capsule Take 1 capsule (90 mg total) by mouth daily as needed (palpitations). 07/17/16  Yes Minus Breeding, MD  diltiazem (CARTIA XT) 300 MG 24 hr capsule Take 1 capsule by mouth daily 09/11/17  Yes Dettinger, Fransisca Kaufmann, MD  doxepin (SINEQUAN) 10 MG capsule TAKE 1 CAPSULE BY MOUTH AT BEDTIME 02/20/18  Yes Dettinger, Fransisca Kaufmann, MD  esomeprazole (NEXIUM) 40 MG capsule Take by mouth.   Yes [provider]  fluticasone (FLONASE) 50 MCG/ACT nasal spray Place 2 sprays into both nostrils daily as needed. For allergies 04/02/18  Yes Dettinger, Fransisca Kaufmann, MD  folic acid (FOLVITE) 1 MG tablet TAKE ONE TABLET BY MOUTH ONCE DAILY - EMERGENCY REFILL FAXED DR. 01/14/17  Yes Setzer, Terri L, NP  gabapentin (NEURONTIN) 800 MG tablet Take 1 tablet (800 mg total) by mouth 4 (four) times daily. 04/02/18  Yes Dettinger, Fransisca Kaufmann, MD  glucose blood test strip 1 each by Other route as needed for other. Use as instructed 01/25/17  Yes Timmothy Euler, MD  hydrocortisone 1 % ointment Apply 1 application topically 3 (three) times daily as needed. Patient taking differently: Apply 1 application topically 3 (three) times daily as needed for itching ("leg pain").  11/04/13   Yes Lysbeth Penner, FNP  ibuprofen (ADVIL,MOTRIN) 400 MG tablet Take 1 tablet (400 mg total) by mouth every 8 (eight) hours as needed. for pain 02/16/18  Yes Hawks, Christy A, FNP  indomethacin (INDOCIN) 50 MG capsule Take 1 capsule (50 mg total) by mouth 3 (three) times daily with meals. 04/17/18  Yes Dettinger, Fransisca Kaufmann, MD  ipratropium (ATROVENT) 0.02 % nebulizer solution Take 2.5 mLs (500 mcg total) by nebulization 4 (four) times daily as needed. For wheezing Patient taking differently: Take 500 mcg by nebulization 4 (four) times daily as needed for wheezing or shortness of breath. For wheezing 07/07/15  Yes Timmothy Euler, MD  lactulose (CHRONULAC) 10 GM/15ML solution Take 30 mLs (20 g total) by mouth 2 (two)  times daily. 01/22/18  Yes Dettinger, Fransisca Kaufmann, MD  lidocaine (XYLOCAINE) 5 % ointment Apply 1 application topically 4 (four) times daily as needed. To knees and ankle as needed for pain 05/01/16  Yes Timmothy Euler, MD  Menthol-Methyl Salicylate (MUSCLE RUB) 10-15 % CREA Apply 1 application topically 3 (three) times daily as needed for muscle pain. For muscle pain    Yes [provider]  metolazone (ZAROXOLYN) 2.5 MG tablet TAKE 1 TABLET BY MOUTH 1 & 1/2 HOURS PRIOR TO TORSEMIDE FOR 3 DAYS AS NEEDED FOR SEVERE SWELLING 09/12/17  Yes Dettinger, Fransisca Kaufmann, MD  milk thistle 175 MG tablet Take 175 mg by mouth 4 (four) times daily.    Yes [provider]  Multiple Vitamins-Minerals (MULTI COMPLETE PO) Take 1 tablet by mouth daily.    Yes [provider]  neomycin-bacitracin-polymyxin (NEOSPORIN) ointment Apply 1 application topically every 12 (twelve) hours as needed for wound care. Reported on 06/01/2015   Yes [provider]  nitroGLYCERIN (NITROSTAT) 0.4 MG SL tablet Place 1 tablet (0.4 mg total) under the tongue every 5 (five) minutes as needed for chest pain (may repeat x3). 06/08/15  Yes Ryen Rhames, Jeneen Rinks, MD  potassium chloride SA (K-DUR,KLOR-CON) 20 MEQ  tablet TAKE 1 TABLET BY MOUTH TWICE DAILY 02/20/18  Yes Dettinger, Fransisca Kaufmann, MD  propranolol (INDERAL) 20 MG tablet Take 1 tablet (20 mg total) by mouth daily. 01/25/17  Yes Timmothy Euler, MD  sodium chloride (OCEAN) 0.65 % nasal spray Place into the nose. 01/13/16  Yes [provider]  torsemide (DEMADEX) 20 MG tablet Take 2 tablets in the morning and 1 tablet at lunch time. 04/03/18  Yes Dettinger, Fransisca Kaufmann, MD  VOLTAREN 1 % GEL Apply FOUR grams topically FOUR times daily as needed for pain 02/16/18  Yes Hawks, Christy A, FNP  ezetimibe (ZETIA) 10 MG tablet TAKE 1 TABLET BY MOUTH EVERY DAY 02/20/18   Dettinger, Fransisca Kaufmann, MD  meloxicam (MOBIC) 15 MG tablet TAKE 1 TABLET BY MOUTH EVERY DAY Patient not taking: Reported on 04/23/2018 04/09/18   Dettinger, Fransisca Kaufmann, MD  vitamin B-12 (CYANOCOBALAMIN) 1000 MCG tablet Take by mouth. 01/13/16   [provider]     Allergies:   Allopurinol; Oxycodone base; and Prozac [fluoxetine hcl]   Social History   Tobacco Use  . Smoking status: Never Smoker  . Smokeless tobacco: Current User    Types: Snuff, Chew  . Tobacco comment: Pt reports that he "dips"  Substance Use Topics  . Alcohol use: No    Alcohol/week: 1.0 standard drinks    Types: 1 Cans of beer per week    Comment: stopped drinking 2-3 yrs ago.   . Drug use: No    Comment: Use to smoke cocaine and marijuana. No IV drug use     Family Hx: The patient's family history includes Cancer in his brother, father, mother, and sister; Colon cancer in his mother. There is no history of Heart attack or Stroke.  ROS:   Please see the history of present illness.    As stated in the HPI and negative for all other systems.   Prior CV studies:   The following studies were reviewed today:   Labs/Other Tests and Data Reviewed:    EKG:  No ECG reviewed.  Recent Labs: 02/17/2018: ALT 28; BUN 18; Creatinine, Ser 0.94; Hemoglobin 13.3; Platelets 91; Potassium 5.0; Sodium 132   Recent  Lipid Panel Lab Results  Component Value Date/Time  CHOL 154 02/17/2018 10:55 AM   CHOL 97 05/05/2012 01:31 PM   TRIG 149 02/17/2018 10:55 AM   TRIG 175 (H) 01/09/2013 12:28 PM   TRIG 107 05/05/2012 01:31 PM   HDL 40 02/17/2018 10:55 AM   HDL 38 (L) 01/09/2013 12:28 PM   HDL 30 (L) 05/05/2012 01:31 PM   CHOLHDL 3.9 02/17/2018 10:55 AM   LDLCALC 84 02/17/2018 10:55 AM   LDLCALC 78 01/09/2013 12:28 PM   LDLCALC 46 05/05/2012 01:31 PM    Wt Readings from Last 3 Encounters:  09/11/17 (!) 374 lb 6.4 oz (169.8 kg)  06/21/17 (!) 386 lb (175.1 kg)  05/10/17 (!) 380 lb 4.8 oz (172.5 kg)     Objective:    Vital Signs:  There were no vitals taken for this visit.    ASSESSMENT & PLAN:    ATRIAL FIBRILLATION - He has not had any clear sustained paroxysms of this.  He has had a history of GI bleed and it has been established that he would not be a good candidate for anticoagulation.  No change in therapy is indicated.   OBESITY, MORBID -  We again talked about continued efforts at diet and weight loss.    DIASTOLIC HEART FAILURE, CHRONIC - He rarely has to use Zaroxolyn.  He will continue current therapy.  We talked about salt and fluid restriction.  No change in therapy.   SLEEP APNEA - He says that he does use a CPAP.  He says that it needs to get serviced.    COVID-19 Education: The signs and symptoms of COVID-19 were discussed with the patient and how to seek care for testing (follow up with PCP or arrange E-visit).  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  The importance of social distancing was discussed today.  Time:   Today, I have spent 15 minutes with the patient with telehealth technology discussing the above problems.     Medication Adjustments/Labs and Tests Ordered: Current medicines are reviewed at length with the patient today.  Concerns regarding medicines are outlined above.   Tests Ordered: No orders of  the defined types were placed in this encounter.   Medication Changes: No orders of the defined types were placed in this encounter.   Disposition:  Follow up one year  Signed, Minus Breeding, MD  04/22/2018 7:47 PM    [facility]

## 2018-04-22 NOTE — Telephone Encounter (Signed)
Patient needs instructions on video conference with Dr Percival Spanish tomorrow.   He did not know if it would be a webex or my chart or doximity

## 2018-04-22 NOTE — Telephone Encounter (Signed)
Virtual Visit Pre-Appointment Phone Call  Steps For Call:  1. Confirm consent - "In the setting of the current Covid19 crisis, you are scheduled for a (phone or video) visit with your provider on (date) at (time).  Just as we do with many in-office visits, in order for you to participate in this visit, we must obtain consent.  If you'd like, I can send this to your mychart (if signed up) or email for you to review.  Otherwise, I can obtain your verbal consent now.  All virtual visits are billed to your insurance company just like a normal visit would be.  By agreeing to a virtual visit, we'd like you to understand that the technology does not allow for your provider to perform an examination, and thus may limit your provider's ability to fully assess your condition.  Finally, though the technology is pretty good, we cannot assure that it will always work on either your or our end, and in the setting of a video visit, we may have to convert it to a phone-only visit.  In either situation, we cannot ensure that we have a secure connection.  Are you willing to proceed?" STAFF: Did the patient verbally acknowledge consent to telehealth visit?  Yes  Confirm the BEST phone number to call the day of the visit: [phone] 2. Give patient instructions for WebEx/MyChart download to smartphone as below or Doximity/Doxy.me if video visit (depending on what platform provider is using)  3. Advise patient to be prepared with any vital sign or heart rhythm information, their current medicines, and a piece of paper and pen handy for any instructions they may receive the day of their visit  4. Inform patient they will receive a phone call 15 minutes prior to their appointment time (may be from unknown caller ID) so they should be prepared to answer  5. Confirm that appointment type is correct in Epic appointment notes (video vs telephone)     TELEPHONE CALL NOTE  Paola D Schmuck has been deemed a candidate  for a follow-up tele-health visit to limit community exposure during the Covid-19 pandemic. I spoke with the patient via phone to ensure availability of phone/video source, confirm preferred email & phone number, and discuss instructions and expectations.  I reminded Andre Swander Kachel to be prepared with any vital sign and/or heart rhythm information that could potentially be obtained via home monitoring, at the time of his visit. I reminded Taz Vanness Pung to expect a phone call at the time of his visit if his visit.  Weston Anna 04/22/2018 2:37 PM   DOWNLOADING THE [place] APP TO SMARTPHONE  - If Apple, ask patient to go to App Store and type in WebEx in the search bar. [place] [employer], the blue/green circle. If Android, go to [employer] and type in [employer] in the search bar. The app is free but as with any other app downloads, their phone may require them to verify saved payment information or Apple/Android password.  - The patient does NOT have to create an account. - On the day of the visit, the assist will walk the patient through joining the meeting with the meeting number/password.  DOWNLOADING THE MYCHART APP TO SMARTPHONE  - If Apple, go to [employer] and type in MyChart in the search bar and download the app. If Android, ask patient to go to [employer] and type in [place] in the search bar and download the  app. The app is free but as with any other app downloads, their phone may require them to verify saved payment information or Apple/Android password.  - The patient will need to then log into the app with their MyChart username and password, and select [facility] as their healthcare provider to link the account. When it is time for your visit, go to the MyChart app, find appointments, and click Begin Video Visit. Be sure to Select Allow for your device to access the Microphone and Camera for your visit. You will then be connected, and your provider will be  with you shortly.  **If they have any issues connecting, or need assistance please contact [place] [phone])**  **If using a computer, in order to ensure the best quality for your visit they will need to use either of the following Internet Browsers: [employer] [place], or Google Chrome**  [place]   I hereby voluntarily request, consent and authorize [facility] and its employed or contracted physicians, physician assistants, nurse practitioners or other licensed health care professionals (the Practitioner), to provide me with telemedicine health care services (the Services") as deemed necessary by the treating Practitioner. I acknowledge and consent to receive the Services by the Practitioner via telemedicine. I understand that the telemedicine visit will involve communicating with the Practitioner through live audiovisual communication technology and the disclosure of certain medical information by electronic transmission. I acknowledge that I have been given the opportunity to request an in-person assessment or other available alternative prior to the telemedicine visit and am voluntarily participating in the telemedicine visit.  I understand that I have the right to withhold or withdraw my consent to the use of telemedicine in the course of my care at any time, without affecting my right to future care or treatment, and that the Practitioner or I may terminate the telemedicine visit at any time. I understand that I have the right to inspect all information obtained and/or recorded in the course of the telemedicine visit and may receive copies of available information for a reasonable fee.  I understand that some of the potential risks of receiving the Services via telemedicine include:   Delay or interruption in medical evaluation due to technological equipment failure or disruption;  Information transmitted may not be sufficient (e.g.  poor resolution of images) to allow for appropriate medical decision making by the Practitioner; and/or   In rare instances, security protocols could fail, causing a breach of personal health information.  Furthermore, I acknowledge that it is my responsibility to provide information about my medical history, conditions and care that is complete and accurate to the best of my ability. I acknowledge that Practitioner's advice, recommendations, and/or decision may be based on factors not within their control, such as incomplete or inaccurate data provided by me or distortions of diagnostic images or specimens that may result from electronic transmissions. I understand that the practice of medicine is not an exact science and that Practitioner makes no warranties or guarantees regarding treatment outcomes. I acknowledge that I will receive a copy of this consent concurrently upon execution via email to the email address I last provided but may also request a printed copy by calling the office of [facility].    I understand that my insurance will be billed for this visit.   I have read or had this consent read to me.  I understand the contents of this consent, which adequately explains the benefits and  risks of the Services being provided via telemedicine.   I have been provided ample opportunity to ask questions regarding this consent and the Services and have had my questions answered to my satisfaction.  I give my informed consent for the services to be provided through the use of telemedicine in my medical care  By participating in this telemedicine visit I agree to the above.

## 2018-04-23 ENCOUNTER — Telehealth (INDEPENDENT_AMBULATORY_CARE_PROVIDER_SITE_OTHER): Payer: Medicare HMO | Admitting: Cardiology

## 2018-04-23 ENCOUNTER — Encounter: Payer: Self-pay | Admitting: Cardiology

## 2018-04-23 VITALS — Ht 72.0 in | Wt 375.0 lb

## 2018-04-23 DIAGNOSIS — Z7189 Other specified counseling: Secondary | ICD-10-CM

## 2018-04-23 DIAGNOSIS — I480 Paroxysmal atrial fibrillation: Secondary | ICD-10-CM | POA: Diagnosis not present

## 2018-04-23 DIAGNOSIS — Z9989 Dependence on other enabling machines and devices: Secondary | ICD-10-CM | POA: Diagnosis not present

## 2018-04-23 DIAGNOSIS — I5032 Chronic diastolic (congestive) heart failure: Secondary | ICD-10-CM | POA: Diagnosis not present

## 2018-04-23 DIAGNOSIS — G4730 Sleep apnea, unspecified: Secondary | ICD-10-CM

## 2018-04-23 NOTE — Patient Instructions (Signed)
Medication Instructions:  The current medical regimen is effective;  continue present plan and medications.  If you need a refill on your cardiac medications before your next appointment, please call your pharmacy.   Follow-Up: Follow up in 1 year with Dr. Hochrein in [place].  You will receive a letter in the mail 2 months before you are due.  Please call us when you receive this letter to schedule your follow up appointment.  Thank you for choosing [facility]!!     

## 2018-04-24 ENCOUNTER — Other Ambulatory Visit: Payer: Self-pay | Admitting: *Deleted

## 2018-04-24 MED ORDER — TORSEMIDE 20 MG PO TABS: ORAL_TABLET | ORAL | 0 refills | Status: DC

## 2018-04-30 ENCOUNTER — Telehealth: Payer: Self-pay | Admitting: Family Medicine

## 2018-04-30 NOTE — Telephone Encounter (Signed)
Left message for patient to call .  Which medicines are running short?

## 2018-04-30 NOTE — Telephone Encounter (Signed)
Patient says pharmacy has give him bottles with quantities of 20 pills.   He wanted [place] to call them.  I advised this was not on his electronic scripts and we could not call the pharmacy due to professional ethics.  He was advised to take the bottles back to pharmacy for review.

## 2018-05-26 ENCOUNTER — Encounter: Payer: Self-pay | Admitting: Family Medicine

## 2018-05-26 ENCOUNTER — Ambulatory Visit (INDEPENDENT_AMBULATORY_CARE_PROVIDER_SITE_OTHER): Payer: Medicare HMO | Admitting: Family Medicine

## 2018-05-26 ENCOUNTER — Other Ambulatory Visit: Payer: Self-pay

## 2018-05-26 DIAGNOSIS — Z9189 Other specified personal risk factors, not elsewhere classified: Secondary | ICD-10-CM

## 2018-05-26 DIAGNOSIS — E1165 Type 2 diabetes mellitus with hyperglycemia: Secondary | ICD-10-CM

## 2018-05-26 MED ORDER — PRAVASTATIN SODIUM 10 MG PO TABS: 10.0000 mg | ORAL_TABLET | Freq: Every day | ORAL | 3 refills | Status: DC

## 2018-05-26 NOTE — Progress Notes (Signed)
Virtual Visit via telephone Note  I connected with [name] on 05/26/18 at 1606 by telephone and verified that I am speaking with the correct person using two identifiers. [name] is currently located at home and no other people are currently with her during visit. The provider, Fransisca Kaufmann Callen Vancuren, MD is located in their office at time of visit.  Call ended at 1616  I discussed the limitations, risks, security and privacy concerns of performing an evaluation and management service by telephone and the availability of in person appointments. I also discussed with the patient that there may be a patient responsible charge related to this service. The patient expressed understanding and agreed to proceed.   History and Present Illness: Patient is called in for discussion about a statin that was recommended by his insurance company.  Patient does have diabetes which does put him at a 10-year risk and he was instructed by his insurance company to give [place] a call and discuss it.  We discussed the positives negatives of it and reduction in risk and he is agreeable towards trying a statin and we will send a prescription for him.  He denies any chest pain or shortness of breath or difficulties currently but just wants to prevent it.  No diagnosis found.  Outpatient Encounter Medications as of 05/26/2018  Medication Sig  . albuterol (PROAIR HFA) 108 (90 Base) MCG/ACT inhaler Inhale 2 puffs into the lungs every 6 (six) hours as needed for wheezing or shortness of breath.  . ALPRAZolam (XANAX) 1 MG tablet TAKE 1 OR 2 TABLETS BY MOUTH AT BEDTIME  . aspirin EC 81 MG tablet Take 81 mg by mouth every 6 (six) hours as needed (CHEST PAIN).  Marland Kitchen baclofen (LIORESAL) 10 MG tablet Take 1 tablet (10 mg total) by mouth 3 (three) times daily.  . betamethasone dipropionate (DIPROLENE) 0.05 % cream APPLY TO AFFECTED AREA TWICE A DAY (Patient taking differently: 1 application to legs once a day as needed  for itching.)  . buPROPion (WELLBUTRIN XL) 300 MG 24 hr tablet TAKE 1 TABLET BY MOUTH EVERY DAY  . Colchicine 0.6 MG CAPS Take 0.6 mg by mouth 3 (three) times daily as needed. TAKE ONE CAPSULE BY MOUTH TWICE DAILY  . diltiazem (CARDIZEM SR) 90 MG 12 hr capsule Take 1 capsule (90 mg total) by mouth daily as needed (palpitations).  Marland Kitchen diltiazem (CARTIA XT) 300 MG 24 hr capsule Take 1 capsule by mouth daily  . doxepin (SINEQUAN) 10 MG capsule TAKE 1 CAPSULE BY MOUTH AT BEDTIME  . esomeprazole (NEXIUM) 40 MG capsule Take by mouth.  . ezetimibe (ZETIA) 10 MG tablet TAKE 1 TABLET BY MOUTH EVERY DAY  . fluticasone (FLONASE) 50 MCG/ACT nasal spray Place 2 sprays into both nostrils daily as needed. For allergies  . folic acid (FOLVITE) 1 MG tablet TAKE ONE TABLET BY MOUTH ONCE DAILY - EMERGENCY REFILL FAXED DR.  Marland Kitchen gabapentin (NEURONTIN) 800 MG tablet Take 1 tablet (800 mg total) by mouth 4 (four) times daily.  Marland Kitchen glucose blood test strip 1 each by Other route as needed for other. Use as instructed  . hydrocortisone 1 % ointment Apply 1 application topically 3 (three) times daily as needed. (Patient taking differently: Apply 1 application topically 3 (three) times daily as needed for itching ("leg pain"). )  . ibuprofen (ADVIL,MOTRIN) 400 MG tablet Take 1 tablet (400 mg total) by mouth every 8 (eight) hours as needed. for pain  . indomethacin (  INDOCIN) 50 MG capsule Take 1 capsule (50 mg total) by mouth 3 (three) times daily with meals.  Marland Kitchen ipratropium (ATROVENT) 0.02 % nebulizer solution Take 2.5 mLs (500 mcg total) by nebulization 4 (four) times daily as needed. For wheezing (Patient taking differently: Take 500 mcg by nebulization 4 (four) times daily as needed for wheezing or shortness of breath. For wheezing)  . lactulose (CHRONULAC) 10 GM/15ML solution Take 30 mLs (20 g total) by mouth 2 (two) times daily.  Marland Kitchen lidocaine (XYLOCAINE) 5 % ointment Apply 1 application topically 4 (four) times daily as needed.  To knees and ankle as needed for pain  . meloxicam (MOBIC) 15 MG tablet TAKE 1 TABLET BY MOUTH EVERY DAY (Patient not taking: Reported on 04/23/2018)  . Menthol-Methyl Salicylate (MUSCLE RUB) 10-15 % CREA Apply 1 application topically 3 (three) times daily as needed for muscle pain. For muscle pain   . metolazone (ZAROXOLYN) 2.5 MG tablet TAKE 1 TABLET BY MOUTH 1 & 1/2 HOURS PRIOR TO TORSEMIDE FOR 3 DAYS AS NEEDED FOR SEVERE SWELLING  . milk thistle 175 MG tablet Take 175 mg by mouth 4 (four) times daily.   . Multiple Vitamins-Minerals (MULTI COMPLETE PO) Take 1 tablet by mouth daily.   Marland Kitchen neomycin-bacitracin-polymyxin (NEOSPORIN) ointment Apply 1 application topically every 12 (twelve) hours as needed for wound care. Reported on 06/01/2015  . nitroGLYCERIN (NITROSTAT) 0.4 MG SL tablet Place 1 tablet (0.4 mg total) under the tongue every 5 (five) minutes as needed for chest pain (may repeat x3).  . potassium chloride SA (K-DUR,KLOR-CON) 20 MEQ tablet TAKE 1 TABLET BY MOUTH TWICE DAILY  . propranolol (INDERAL) 20 MG tablet Take 1 tablet (20 mg total) by mouth daily.  . sodium chloride (OCEAN) 0.65 % nasal spray Place into the nose.  . torsemide (DEMADEX) 20 MG tablet Take 2 tablets in the morning and 1 tablet at lunch time.  . vitamin B-12 (CYANOCOBALAMIN) 1000 MCG tablet Take by mouth.  . VOLTAREN 1 % GEL Apply FOUR grams topically FOUR times daily as needed for pain   No facility-administered encounter medications on file as of 05/26/2018.     Review of Systems  Constitutional: Negative for chills and fever.  Respiratory: Negative for shortness of breath and wheezing.   Cardiovascular: Negative for chest pain and leg swelling.  Musculoskeletal: Negative for back pain and gait problem.  Skin: Negative for rash.  All other systems reviewed and are negative.   Observations/Objective: In no acute distress  Assessment and Plan: Problem List Items Addressed This Visit    None    Visit  Diagnoses    10 year risk of MI or stroke < 7.5%    -  Primary      The 10-year ASCVD risk score Mikey Bussing DC Jr., et al., 2013) is: 6.7%   Values used to calculate the score:     Age: 53 years     Sex: Male     Is Non-Hispanic African American: No     Diabetic: Yes     Tobacco smoker: No     Systolic Blood Pressure: 793 mmHg     Is BP treated: Yes     HDL Cholesterol: 40 mg/dL     Total Cholesterol: 154 mg/dL  Follow Up Instructions:     I discussed the assessment and treatment plan with the patient. The patient was provided an opportunity to ask questions and all were answered. The patient agreed with the plan and demonstrated an  understanding of the instructions.   The patient was advised to call back or seek an in-person evaluation if the symptoms worsen or if the condition fails to improve as anticipated.  The above assessment and management plan was discussed with the patient. The patient verbalized understanding of and has agreed to the management plan. Patient is aware to call the clinic if symptoms persist or worsen. Patient is aware when to return to the clinic for a follow-up visit. Patient educated on when it is appropriate to go to the emergency department.    I provided 10 minutes of non-face-to-face time during this encounter.    Worthy Rancher, MD

## 2018-05-27 ENCOUNTER — Other Ambulatory Visit: Payer: Self-pay | Admitting: Family Medicine

## 2018-05-28 ENCOUNTER — Other Ambulatory Visit: Payer: Self-pay | Admitting: Family Medicine

## 2018-05-28 DIAGNOSIS — K7030 Alcoholic cirrhosis of liver without ascites: Secondary | ICD-10-CM

## 2018-06-03 DIAGNOSIS — R42 Dizziness and giddiness: Secondary | ICD-10-CM | POA: Diagnosis not present

## 2018-06-03 DIAGNOSIS — M130 Polyarthritis, unspecified: Secondary | ICD-10-CM | POA: Diagnosis not present

## 2018-06-03 DIAGNOSIS — G5712 Meralgia paresthetica, left lower limb: Secondary | ICD-10-CM | POA: Diagnosis not present

## 2018-06-03 DIAGNOSIS — G894 Chronic pain syndrome: Secondary | ICD-10-CM | POA: Diagnosis not present

## 2018-06-04 ENCOUNTER — Other Ambulatory Visit: Payer: Self-pay | Admitting: *Deleted

## 2018-06-04 MED ORDER — GLUCOSE BLOOD VI STRP: ORAL_STRIP | 3 refills | Status: DC

## 2018-06-09 ENCOUNTER — Other Ambulatory Visit: Payer: Self-pay | Admitting: Family Medicine

## 2018-06-09 NOTE — Telephone Encounter (Signed)
LMOVM test strips sent to pharmacy on 06/04/18 lmtcb to discuss the baclofen

## 2018-06-09 NOTE — Telephone Encounter (Signed)
What is the name of the medication? Needs test strips for One Touch Verio. Baclofen was only called in for #30. Does he take one time a day or three times a day per RX. Needs more pills if it's 3 times a day.  Have you contacted your pharmacy to request a refill? YES  Which pharmacy would you like this sent to? [facility]   Patient notified that their request is being sent to the clinical staff for review and that they should receive a call once it is complete. If they do not receive a call within 24 hours they can check with their pharmacy or our office.

## 2018-06-10 ENCOUNTER — Other Ambulatory Visit: Payer: Self-pay | Admitting: Family Medicine

## 2018-06-10 NOTE — Telephone Encounter (Signed)
LMOVM - baclofen is a muscle relaxer for a short term need thus the reason only written for #30 TID

## 2018-06-11 DIAGNOSIS — R69 Illness, unspecified: Secondary | ICD-10-CM | POA: Diagnosis not present

## 2018-06-11 DIAGNOSIS — M170 Bilateral primary osteoarthritis of knee: Secondary | ICD-10-CM | POA: Diagnosis not present

## 2018-06-11 DIAGNOSIS — M25562 Pain in left knee: Secondary | ICD-10-CM | POA: Diagnosis not present

## 2018-06-11 DIAGNOSIS — M25561 Pain in right knee: Secondary | ICD-10-CM | POA: Diagnosis not present

## 2018-06-11 DIAGNOSIS — M25461 Effusion, right knee: Secondary | ICD-10-CM | POA: Diagnosis not present

## 2018-06-11 DIAGNOSIS — R262 Difficulty in walking, not elsewhere classified: Secondary | ICD-10-CM | POA: Diagnosis not present

## 2018-06-19 ENCOUNTER — Telehealth: Payer: Self-pay | Admitting: Family Medicine

## 2018-06-20 MED ORDER — ALPRAZOLAM 1 MG PO TABS: ORAL_TABLET | ORAL | 0 refills | Status: DC

## 2018-06-20 NOTE — Telephone Encounter (Signed)
I sent in refill for him, in the future these always have to be during the visit filled because of the nature of the controlled substance so let us try and plan accordingly Caryl Pina, MD [facility] 06/20/2018, 8:00 AM

## 2018-06-20 NOTE — Telephone Encounter (Signed)
lmtcb

## 2018-06-23 NOTE — Telephone Encounter (Signed)
Patient hopes during his office visit in two weeks,  Provider can line up the xanax refill to be due at first on month like othew medications.  This will keep his pill pack straight with all medicines due at same time.

## 2018-06-30 ENCOUNTER — Other Ambulatory Visit: Payer: Self-pay | Admitting: Family Medicine

## 2018-07-03 ENCOUNTER — Other Ambulatory Visit: Payer: Self-pay | Admitting: Family Medicine

## 2018-07-03 ENCOUNTER — Ambulatory Visit: Payer: Medicare HMO | Admitting: Family Medicine

## 2018-07-03 NOTE — Telephone Encounter (Signed)
Please review and advise.

## 2018-07-03 NOTE — Telephone Encounter (Signed)
He needs to be seen for these refills and he knows this.

## 2018-07-09 ENCOUNTER — Other Ambulatory Visit: Payer: Self-pay

## 2018-07-10 ENCOUNTER — Ambulatory Visit (INDEPENDENT_AMBULATORY_CARE_PROVIDER_SITE_OTHER): Payer: Medicare HMO | Admitting: Family Medicine

## 2018-07-10 ENCOUNTER — Encounter: Payer: Self-pay | Admitting: Family Medicine

## 2018-07-10 VITALS — BP 129/89 | HR 74 | Temp 98.2°F | Ht 73.0 in | Wt 390.0 lb

## 2018-07-10 DIAGNOSIS — R69 Illness, unspecified: Secondary | ICD-10-CM | POA: Diagnosis not present

## 2018-07-10 DIAGNOSIS — F419 Anxiety disorder, unspecified: Secondary | ICD-10-CM

## 2018-07-10 DIAGNOSIS — R5383 Other fatigue: Secondary | ICD-10-CM

## 2018-07-10 DIAGNOSIS — F339 Major depressive disorder, recurrent, unspecified: Secondary | ICD-10-CM

## 2018-07-10 DIAGNOSIS — Z79891 Long term (current) use of opiate analgesic: Secondary | ICD-10-CM | POA: Diagnosis not present

## 2018-07-10 MED ORDER — ALPRAZOLAM 1 MG PO TABS: ORAL_TABLET | ORAL | 2 refills | Status: DC

## 2018-07-10 MED ORDER — NALOXONE HCL 4 MG/0.1ML NA LIQD: NASAL | 1 refills | Status: DC

## 2018-07-10 NOTE — Progress Notes (Signed)
BP 129/89   Pulse 74   Temp 98.2 F (36.8 C) (Oral)   Ht 6' 1"  (1.854 m)   Wt (!) 390 lb (176.9 kg)   BMI 51.45 kg/m    Subjective:   Patient ID: [name], male    DOB: Oct 04, 1965, 53 y.o.   MRN: [id number]  HPI: [name] is a 53 y.o. male presenting on 07/10/2018 for medication check (xanax refill ) and Medical Management of Chronic Issues   HPI Anxiety and controlled substance recheck Current rx-alprazolam 0.25 mg twice daily. # meds rx-60 Effectiveness of current meds-works well when he has it, he ran out the last couple weeks and has been struggling with it. Adverse reactions form meds-none  Pill count performed-No Last drug screen -not on file ( high risk q27m moderate risk q658mlow risk yearly ) Urine drug screen today- Yes Was the N[place]one[place]ed-yes  If yes were their any concerning findings? -Patient has recently started possibly on narcotic pain medication and there is some concern about interaction and we discussed that.  No flowsheet data found.   Pain contract signed on: 07/10/2018  Patient is also still been having a lot of fatigue and wants to discuss either B12 injections or testosterone gel, he has had both in previous times.  We will do some blood work and testing prior to see where he is at with both of those.  Relevant past medical, surgical, family and social history reviewed and updated as indicated. Interim medical history since our last visit reviewed. Allergies and medications reviewed and updated.  Review of Systems  Constitutional: Negative for chills and fever.  Respiratory: Negative for shortness of breath and wheezing.   Cardiovascular: Negative for chest pain and leg swelling.  Musculoskeletal: Negative for back pain and gait problem.  Skin: Negative for rash.  Psychiatric/Behavioral: Positive for dysphoric mood and sleep disturbance. Negative for self-injury and suicidal ideas. The patient is nervous/anxious.   All other  systems reviewed and are negative.   Per HPI unless specifically indicated above   Allergies as of 07/10/2018      Reactions   Allopurinol Other (See Comments)   Joint pain worse    Oxycodone Base Itching   Prozac [fluoxetine Hcl] Itching      Medication List       Accurate as of July 10, 2018  2:32 PM. If you have any questions, ask your nurse or doctor.        STOP taking these medications   buPROPion 300 MG 24 hr tablet Commonly known as: WELLBUTRIN XL Stopped by: JoFransisca Kaufmannettinger, MD     TAKE these medications   albuterol 108 (90 Base) MCG/ACT inhaler Commonly known as: ProAir HFA Inhale 2 puffs into the lungs every 6 (six) hours as needed for wheezing or shortness of breath.   ALPRAZolam 1 MG tablet Commonly known as: XANAX TAKE 1 OR 2 TABLETS BY MOUTH AT BEDTIME   aspirin EC 81 MG tablet Take 81 mg by mouth every 6 (six) hours as needed (CHEST PAIN).   baclofen 10 MG tablet Commonly known as: LIORESAL Take 1 tablet (10 mg total) by mouth 3 (three) times daily.   betamethasone dipropionate 0.05 % cream Commonly known as: DIPROLENE APPLY TO AFFECTED AREA TWICE A DAY What changed: See the new instructions.   Colchicine 0.6 MG Caps Take 0.6 mg by mouth 3 (three) times daily as needed. TAKE ONE CAPSULE BY MOUTH TWICE DAILY   Constulose  10 GM/15ML solution Generic drug: lactulose take TWO tablespoonsful (30 mls) BY MOUTH TWICE DAILY   diltiazem 300 MG 24 hr capsule Commonly known as: Cartia XT Take 1 capsule by mouth daily   diltiazem 90 MG 12 hr capsule Commonly known as: CARDIZEM SR Take 1 capsule (90 mg total) by mouth daily as needed (palpitations).   doxepin 10 MG capsule Commonly known as: SINEQUAN TAKE ONE CAPSULE BY MOUTH AT BEDTIME   esomeprazole 40 MG capsule Commonly known as: NEXIUM Take by mouth.   ezetimibe 10 MG tablet Commonly known as: ZETIA TAKE 1 TABLET BY MOUTH EVERY DAY   fluticasone 50 MCG/ACT nasal spray Commonly known  as: FLONASE Place 2 sprays into both nostrils daily as needed. For allergies   folic acid 1 MG tablet Commonly known as: FOLVITE TAKE ONE TABLET BY MOUTH ONCE DAILY - EMERGENCY REFILL FAXED DR.   gabapentin 800 MG tablet Commonly known as: NEURONTIN Take 1 tablet (800 mg total) by mouth 4 (four) times daily.   glucose blood test strip 1 each by Other route as needed for other. Use as instructed   glucose blood test strip Commonly known as: OneTouch Verio Test blood sugar daily Dx E11.9   hydrocortisone 1 % ointment Apply 1 application topically 3 (three) times daily as needed. What changed: reasons to take this   ibuprofen 400 MG tablet Commonly known as: ADVIL Take 1 tablet (400 mg total) by mouth every 8 (eight) hours as needed. for pain   indomethacin 50 MG capsule Commonly known as: INDOCIN Take 1 capsule (50 mg total) by mouth 3 (three) times daily with meals.   ipratropium 0.02 % nebulizer solution Commonly known as: ATROVENT Take 2.5 mLs (500 mcg total) by nebulization 4 (four) times daily as needed. For wheezing What changed: reasons to take this   lidocaine 5 % ointment Commonly known as: XYLOCAINE Apply 1 application topically 4 (four) times daily as needed. To knees and ankle as needed for pain   meloxicam 15 MG tablet Commonly known as: MOBIC TAKE 1 TABLET BY MOUTH EVERY DAY   metolazone 2.5 MG tablet Commonly known as: ZAROXOLYN TAKE 1 TABLET BY MOUTH 1 & 1/2 HOURS PRIOR TO TORSEMIDE FOR 3 DAYS AS NEEDED FOR SEVERE SWELLING   milk thistle 175 MG tablet Take 175 mg by mouth 4 (four) times daily.   MULTI COMPLETE PO Take 1 tablet by mouth daily.   Muscle Rub 10-15 % Crea Apply 1 application topically 3 (three) times daily as needed for muscle pain. For muscle pain   neomycin-bacitracin-polymyxin ointment Commonly known as: NEOSPORIN Apply 1 application topically every 12 (twelve) hours as needed for wound care. Reported on 06/01/2015    nitroGLYCERIN 0.4 MG SL tablet Commonly known as: NITROSTAT Place 1 tablet (0.4 mg total) under the tongue every 5 (five) minutes as needed for chest pain (may repeat x3).   potassium chloride SA 20 MEQ tablet Commonly known as: K-DUR TAKE 1 TABLET BY MOUTH TWICE DAILY   pravastatin 10 MG tablet Commonly known as: PRAVACHOL Take 1 tablet (10 mg total) by mouth daily.   propranolol 20 MG tablet Commonly known as: INDERAL Take 1 tablet (20 mg total) by mouth daily.   sodium chloride 0.65 % nasal spray Commonly known as: OCEAN Place into the nose.   torsemide 20 MG tablet Commonly known as: DEMADEX TAKE TWO TABLETS BY MOUTH in THE morning AND take ONE tablet BY MOUTH AT LUNCH time   vitamin B-12  1000 MCG tablet Commonly known as: CYANOCOBALAMIN Take by mouth.   Voltaren 1 % Gel Generic drug: diclofenac sodium Apply FOUR grams topically FOUR times daily as needed for pain        Objective:   BP 129/89   Pulse 74   Temp 98.2 F (36.8 C) (Oral)   Ht 6' 1"  (1.854 m)   Wt (!) 390 lb (176.9 kg)   BMI 51.45 kg/m   Wt Readings from Last 3 Encounters:  07/10/18 (!) 390 lb (176.9 kg)  04/23/18 (!) 375 lb (170.1 kg)  09/11/17 (!) 374 lb 6.4 oz (169.8 kg)    Physical Exam Vitals signs and nursing note reviewed.  Constitutional:      General: He is not in acute distress.    Appearance: He is well-developed. He is not diaphoretic.  Eyes:     General: No scleral icterus.    Conjunctiva/sclera: Conjunctivae normal.  Neck:     Thyroid: No thyromegaly.  Cardiovascular:     Rate and Rhythm: Normal rate and regular rhythm.     Heart sounds: Normal heart sounds. No murmur.  Pulmonary:     Effort: Pulmonary effort is normal. No respiratory distress.     Breath sounds: Normal breath sounds. No wheezing.  Neurological:     Mental Status: He is alert and oriented to person, place, and time.     Coordination: Coordination normal.  Psychiatric:        Mood and Affect: Mood  is anxious and depressed.        Behavior: Behavior normal.        Thought Content: Thought content does not include suicidal ideation. Thought content does not include suicidal plan.       Assessment & Plan:   Problem List Items Addressed This Visit      Other   Depression, recurrent ([place])   Relevant Medications   ALPRAZolam (XANAX) 1 MG tablet   Other Relevant Orders   ToxASSURE Select 13 (MW), Urine    Other Visit Diagnoses    Anxiety    -  Primary   Relevant Medications   ALPRAZolam (XANAX) 1 MG tablet   Other Relevant Orders   ToxASSURE Select 13 (MW), Urine   Other fatigue       Relevant Orders   Vitamin B12   Testosterone,Free and Total    Will do urine drug screen and testing, warned patient about interaction between narcotics and these medications of the benzodiazepines.  Follow up plan: Return in about 3 months (around 10/10/2018), or if symptoms worsen or fail to improve, for Diabetes and anxiety recheck.  Counseling provided for all of the vaccine components No orders of the defined types were placed in this encounter.   Caryl Pina, MD [facility] 07/10/2018, 2:32 PM

## 2018-07-13 LAB — TOXASSURE SELECT 13 (MW), URINE

## 2018-07-14 ENCOUNTER — Telehealth: Payer: Self-pay | Admitting: Family Medicine

## 2018-07-14 DIAGNOSIS — G894 Chronic pain syndrome: Secondary | ICD-10-CM | POA: Diagnosis not present

## 2018-07-14 DIAGNOSIS — M130 Polyarthritis, unspecified: Secondary | ICD-10-CM | POA: Diagnosis not present

## 2018-07-14 DIAGNOSIS — R42 Dizziness and giddiness: Secondary | ICD-10-CM | POA: Diagnosis not present

## 2018-07-14 DIAGNOSIS — G5712 Meralgia paresthetica, left lower limb: Secondary | ICD-10-CM | POA: Diagnosis not present

## 2018-07-15 ENCOUNTER — Other Ambulatory Visit: Payer: Medicare HMO

## 2018-07-15 ENCOUNTER — Other Ambulatory Visit: Payer: Self-pay

## 2018-07-15 DIAGNOSIS — R5383 Other fatigue: Secondary | ICD-10-CM | POA: Diagnosis not present

## 2018-07-15 DIAGNOSIS — R6889 Other general symptoms and signs: Secondary | ICD-10-CM | POA: Diagnosis not present

## 2018-07-15 NOTE — Telephone Encounter (Signed)
Appt made for next week, no OV notes regarding Dx in the last 6 mos

## 2018-07-16 ENCOUNTER — Telehealth: Payer: Self-pay | Admitting: Family Medicine

## 2018-07-16 NOTE — Telephone Encounter (Signed)
Aware.  Labs not resulted by provider .

## 2018-07-17 LAB — TESTOSTERONE,FREE AND TOTAL
Testosterone, Free: 2.6 pg/mL — ABNORMAL LOW (ref 7.2–24.0)
Testosterone: 503 ng/dL (ref 264–916)

## 2018-07-17 LAB — VITAMIN B12: Vitamin B-12: 448 pg/mL (ref 232–1245)

## 2018-07-23 DIAGNOSIS — M25571 Pain in right ankle and joints of right foot: Secondary | ICD-10-CM | POA: Diagnosis not present

## 2018-07-23 DIAGNOSIS — M1712 Unilateral primary osteoarthritis, left knee: Secondary | ICD-10-CM | POA: Diagnosis not present

## 2018-07-23 DIAGNOSIS — M25562 Pain in left knee: Secondary | ICD-10-CM | POA: Diagnosis not present

## 2018-07-24 ENCOUNTER — Other Ambulatory Visit: Payer: Self-pay | Admitting: *Deleted

## 2018-07-24 ENCOUNTER — Telehealth (INDEPENDENT_AMBULATORY_CARE_PROVIDER_SITE_OTHER): Payer: Medicare HMO | Admitting: Family Medicine

## 2018-07-24 ENCOUNTER — Encounter: Payer: Self-pay | Admitting: Family Medicine

## 2018-07-24 DIAGNOSIS — Z9989 Dependence on other enabling machines and devices: Secondary | ICD-10-CM

## 2018-07-24 DIAGNOSIS — G4733 Obstructive sleep apnea (adult) (pediatric): Secondary | ICD-10-CM | POA: Diagnosis not present

## 2018-07-24 MED ORDER — BACLOFEN 10 MG PO TABS: 10.0000 mg | ORAL_TABLET | Freq: Three times a day (TID) | ORAL | 3 refills | Status: DC

## 2018-07-24 MED ORDER — ALPRAZOLAM 1 MG PO TABS: ORAL_TABLET | ORAL | 0 refills | Status: DC

## 2018-07-24 NOTE — Progress Notes (Signed)
Okay thanks, I do not know where they came from but it looks like came from a phone call from [place], since he did not ask for the refills on the Xanax can we go ahead and call pharmacy and cancel

## 2018-07-24 NOTE — Progress Notes (Unsigned)
Patient is on hydromorphone from Dr. Merlene Laughter, I will not prescribe tramadol on top of this, it looks like Dr. Merlene Laughter just prescribed tramadol at the beginning the month as well.  Because patient is currently on hydromorphone and the interaction between that and the alprazolam we will start to taper him off the alprazolam because of drug interaction and we will go down by 5 tablets every month until he is off of the alprazolam, have him make a visit in 1 month from now.  He needs to bring these up during that visit, I will not refill these again outside of a visit  PDMP reviewed  Caryl Pina, MD [facility] 07/24/2018, 1:28 PM

## 2018-07-24 NOTE — Progress Notes (Signed)
Pt called back to inform that he did not call and ask for refills

## 2018-07-24 NOTE — Progress Notes (Signed)
Virtual Visit via telephone Note  I connected with [name] on 07/24/18 at [facility] by telephone and verified that I am speaking with the correct person using two identifiers. [name] is currently located at home and no other people are currently with her during visit. The provider, Fransisca Kaufmann Raymondo Garcialopez, MD is located in their office at time of visit.  Call ended at 0950  I discussed the limitations, risks, security and privacy concerns of performing an evaluation and management service by telephone and the availability of in person appointments. I also discussed with the patient that there may be a patient responsible charge related to this service. The patient expressed understanding and agreed to proceed.   History and Present Illness: Patient is calling in for a recheck for sleep apnea. He feels like his machine is no working as well as previously.  He is still using his sleep machine every night for about 8-10 hours.  He has had this machine for 6 years.  His last sleep study was about 8 years ago.  His settings are variable setting 10-16 auto titrate. He uses a nasal mask. He uses [employer] or whoever bought them out.   No diagnosis found.  Outpatient Encounter Medications as of 07/24/2018  Medication Sig  . albuterol (PROAIR HFA) 108 (90 Base) MCG/ACT inhaler Inhale 2 puffs into the lungs every 6 (six) hours as needed for wheezing or shortness of breath.  . ALPRAZolam (XANAX) 1 MG tablet TAKE 1 OR 2 TABLETS BY MOUTH AT BEDTIME  . aspirin EC 81 MG tablet Take 81 mg by mouth every 6 (six) hours as needed (CHEST PAIN).  Marland Kitchen baclofen (LIORESAL) 10 MG tablet Take 1 tablet (10 mg total) by mouth 3 (three) times daily.  . betamethasone dipropionate (DIPROLENE) 0.05 % cream APPLY TO AFFECTED AREA TWICE A DAY (Patient taking differently: 1 application to legs once a day as needed for itching.)  . Colchicine 0.6 MG CAPS Take 0.6 mg by mouth 3 (three) times daily as needed. TAKE ONE  CAPSULE BY MOUTH TWICE DAILY  . CONSTULOSE 10 GM/15ML solution take TWO tablespoonsful (30 mls) BY MOUTH TWICE DAILY  . diltiazem (CARDIZEM SR) 90 MG 12 hr capsule Take 1 capsule (90 mg total) by mouth daily as needed (palpitations).  Marland Kitchen diltiazem (CARTIA XT) 300 MG 24 hr capsule Take 1 capsule by mouth daily  . doxepin (SINEQUAN) 10 MG capsule TAKE ONE CAPSULE BY MOUTH AT BEDTIME  . esomeprazole (NEXIUM) 40 MG capsule Take by mouth.  . ezetimibe (ZETIA) 10 MG tablet TAKE 1 TABLET BY MOUTH EVERY DAY  . fluticasone (FLONASE) 50 MCG/ACT nasal spray Place 2 sprays into both nostrils daily as needed. For allergies  . folic acid (FOLVITE) 1 MG tablet TAKE ONE TABLET BY MOUTH ONCE DAILY - EMERGENCY REFILL FAXED DR.  Marland Kitchen gabapentin (NEURONTIN) 800 MG tablet Take 1 tablet (800 mg total) by mouth 4 (four) times daily.  Marland Kitchen glucose blood (ONETOUCH VERIO) test strip Test blood sugar daily Dx E11.9  . glucose blood test strip 1 each by Other route as needed for other. Use as instructed  . hydrocortisone 1 % ointment Apply 1 application topically 3 (three) times daily as needed. (Patient taking differently: Apply 1 application topically 3 (three) times daily as needed for itching ("leg pain"). )  . ibuprofen (ADVIL,MOTRIN) 400 MG tablet Take 1 tablet (400 mg total) by mouth every 8 (eight) hours as needed. for pain  . indomethacin (INDOCIN) 50  MG capsule Take 1 capsule (50 mg total) by mouth 3 (three) times daily with meals.  Marland Kitchen ipratropium (ATROVENT) 0.02 % nebulizer solution Take 2.5 mLs (500 mcg total) by nebulization 4 (four) times daily as needed. For wheezing (Patient taking differently: Take 500 mcg by nebulization 4 (four) times daily as needed for wheezing or shortness of breath. For wheezing)  . lidocaine (XYLOCAINE) 5 % ointment Apply 1 application topically 4 (four) times daily as needed. To knees and ankle as needed for pain  . meloxicam (MOBIC) 15 MG tablet TAKE 1 TABLET BY MOUTH EVERY DAY  .  Menthol-Methyl Salicylate (MUSCLE RUB) 10-15 % CREA Apply 1 application topically 3 (three) times daily as needed for muscle pain. For muscle pain   . metolazone (ZAROXOLYN) 2.5 MG tablet TAKE 1 TABLET BY MOUTH 1 & 1/2 HOURS PRIOR TO TORSEMIDE FOR 3 DAYS AS NEEDED FOR SEVERE SWELLING  . milk thistle 175 MG tablet Take 175 mg by mouth 4 (four) times daily.   . Multiple Vitamins-Minerals (MULTI COMPLETE PO) Take 1 tablet by mouth daily.   . naloxone (NARCAN) nasal spray 4 mg/0.1 mL Use as needed for accidental overdose  . neomycin-bacitracin-polymyxin (NEOSPORIN) ointment Apply 1 application topically every 12 (twelve) hours as needed for wound care. Reported on 06/01/2015  . nitroGLYCERIN (NITROSTAT) 0.4 MG SL tablet Place 1 tablet (0.4 mg total) under the tongue every 5 (five) minutes as needed for chest pain (may repeat x3).  . potassium chloride SA (K-DUR,KLOR-CON) 20 MEQ tablet TAKE 1 TABLET BY MOUTH TWICE DAILY  . pravastatin (PRAVACHOL) 10 MG tablet Take 1 tablet (10 mg total) by mouth daily.  . propranolol (INDERAL) 20 MG tablet Take 1 tablet (20 mg total) by mouth daily.  . sodium chloride (OCEAN) 0.65 % nasal spray Place into the nose.  . torsemide (DEMADEX) 20 MG tablet TAKE TWO TABLETS BY MOUTH in THE morning AND take ONE tablet BY MOUTH AT LUNCH time  . vitamin B-12 (CYANOCOBALAMIN) 1000 MCG tablet Take by mouth.  . VOLTAREN 1 % GEL Apply FOUR grams topically FOUR times daily as needed for pain   No facility-administered encounter medications on file as of 07/24/2018.     Review of Systems  Constitutional: Negative for chills and fever.  Respiratory: Negative for shortness of breath and wheezing.   Cardiovascular: Negative for chest pain and leg swelling.  Musculoskeletal: Negative for back pain and gait problem.  Skin: Negative for rash.  Neurological: Negative for dizziness, weakness and light-headedness.  Psychiatric/Behavioral: Positive for sleep disturbance. Negative for  decreased concentration, dysphoric mood, self-injury and suicidal ideas. The patient is not nervous/anxious.   All other systems reviewed and are negative.   Observations/Objective: Patient sounds comfortable and in no acute distress  Assessment and Plan: Problem List Items Addressed This Visit    None    Visit Diagnoses    OSA on CPAP    -  Primary   Relevant Orders   For home use only DME continuous positive airway pressure (CPAP)   CPAP (continuous positive airway pressure) dependence       Relevant Orders   For home use only DME continuous positive airway pressure (CPAP)       Follow Up Instructions: Follow up in 3 months, early octobrer    I discussed the assessment and treatment plan with the patient. The patient was provided an opportunity to ask questions and all were answered. The patient agreed with the plan and demonstrated an understanding of  the instructions.   The patient was advised to call back or seek an in-person evaluation if the symptoms worsen or if the condition fails to improve as anticipated.  The above assessment and management plan was discussed with the patient. The patient verbalized understanding of and has agreed to the management plan. Patient is aware to call the clinic if symptoms persist or worsen. Patient is aware when to return to the clinic for a follow-up visit. Patient educated on when it is appropriate to go to the emergency department.    I provided 10 minutes of non-face-to-face time during this encounter.    Worthy Rancher, MD

## 2018-07-24 NOTE — Progress Notes (Signed)
Patient aware- states he did not call and ask for any refills . Patient is aware Dr. Keturah Barre does not prescribe all his medications and he states it is not even time for his xanax to be refilled.  States that he take his hydromorphone in the morning and the xanax at night due to his cpap machine.

## 2018-07-28 ENCOUNTER — Telehealth: Payer: Self-pay | Admitting: Family Medicine

## 2018-07-28 NOTE — Telephone Encounter (Signed)
Covering PCP- please advise and look at picture in chart.

## 2018-07-29 ENCOUNTER — Encounter: Payer: Self-pay | Admitting: Family Medicine

## 2018-08-11 DIAGNOSIS — G894 Chronic pain syndrome: Secondary | ICD-10-CM | POA: Diagnosis not present

## 2018-08-11 DIAGNOSIS — R42 Dizziness and giddiness: Secondary | ICD-10-CM | POA: Diagnosis not present

## 2018-08-11 DIAGNOSIS — E662 Morbid (severe) obesity with alveolar hypoventilation: Secondary | ICD-10-CM | POA: Diagnosis not present

## 2018-08-11 DIAGNOSIS — M130 Polyarthritis, unspecified: Secondary | ICD-10-CM | POA: Diagnosis not present

## 2018-08-15 ENCOUNTER — Other Ambulatory Visit: Payer: Self-pay | Admitting: Family Medicine

## 2018-09-02 ENCOUNTER — Telehealth: Payer: Self-pay | Admitting: Family Medicine

## 2018-09-02 NOTE — Telephone Encounter (Signed)
Order faxed to [facility]

## 2018-09-05 ENCOUNTER — Encounter: Payer: Self-pay | Admitting: Family Medicine

## 2018-09-05 ENCOUNTER — Ambulatory Visit (INDEPENDENT_AMBULATORY_CARE_PROVIDER_SITE_OTHER): Payer: Medicare HMO | Admitting: Family Medicine

## 2018-09-05 DIAGNOSIS — E1165 Type 2 diabetes mellitus with hyperglycemia: Secondary | ICD-10-CM | POA: Diagnosis not present

## 2018-09-05 DIAGNOSIS — I152 Hypertension secondary to endocrine disorders: Secondary | ICD-10-CM

## 2018-09-05 DIAGNOSIS — E1169 Type 2 diabetes mellitus with other specified complication: Secondary | ICD-10-CM | POA: Diagnosis not present

## 2018-09-05 DIAGNOSIS — R69 Illness, unspecified: Secondary | ICD-10-CM | POA: Diagnosis not present

## 2018-09-05 DIAGNOSIS — F339 Major depressive disorder, recurrent, unspecified: Secondary | ICD-10-CM

## 2018-09-05 DIAGNOSIS — I10 Essential (primary) hypertension: Secondary | ICD-10-CM | POA: Diagnosis not present

## 2018-09-05 DIAGNOSIS — E785 Hyperlipidemia, unspecified: Secondary | ICD-10-CM | POA: Diagnosis not present

## 2018-09-05 DIAGNOSIS — E1159 Type 2 diabetes mellitus with other circulatory complications: Secondary | ICD-10-CM

## 2018-09-05 DIAGNOSIS — D696 Thrombocytopenia, unspecified: Secondary | ICD-10-CM

## 2018-09-05 MED ORDER — ALPRAZOLAM 1 MG PO TABS: 1.0000 mg | ORAL_TABLET | Freq: Every evening | ORAL | 2 refills | Status: DC | PRN

## 2018-09-05 NOTE — Progress Notes (Signed)
Virtual Visit via telephone Note  I connected with [name] on 09/05/18 at 1538 by telephone and verified that I am speaking with the correct person using two identifiers. [name] is currently located at home and no other people are currently with her during visit. The provider, Fransisca Kaufmann , MD is located in their office at time of visit.  Call ended at 1600  I discussed the limitations, risks, security and privacy concerns of performing an evaluation and management service by telephone and the availability of in person appointments. I also discussed with the patient that there may be a patient responsible charge related to this service. The patient expressed understanding and agreed to proceed.   History and Present Illness: Type 2 diabetes mellitus Patient comes in today for recheck of his diabetes. Patient has been currently taking no medication, stopped the metformin because he had been doing well without it and is monitoring blood sugars and is 100-120. Patient is not currently on an ACE inhibitor/ARB. Patient has not seen an ophthalmologist this year. Patient is taking gabapentin for neuropathy.   Hypertension and afib Patient is currently on propranolol and diltiazem, and their blood pressure today is unknown. Patient denies any lightheadedness or dizziness. Patient denies headaches, blurred vision, chest pains, shortness of breath, or weakness. Denies any side effects from medication and is content with current medication.   Hyperlipidemia Patient is coming in for recheck of his hyperlipidemia. The patient is currently taking pravastatin and zetia. They deny any issues with myalgias or history of liver damage from it. They deny any focal numbness or weakness or chest pain.   Depression and anxiety Reduce xanax to 1 per night, he mainly uses it for sleep and anxiety related to his sleep apnea mask.  Patient has been increased on his Dilaudid which puts at  concern the issue of interaction of the 2 and will reduce him so is not getting 1 to 2/day and only 1/day and he does have a dose of Narcan at his house just in case.  Patient denies any suicidal ideations or thoughts of hurting self.  No diagnosis found.  Outpatient Encounter Medications as of 09/05/2018  Medication Sig  . albuterol (PROAIR HFA) 108 (90 Base) MCG/ACT inhaler Inhale 2 puffs into the lungs every 6 (six) hours as needed for wheezing or shortness of breath.  . ALPRAZolam (XANAX) 1 MG tablet TAKE 1 OR 2 TABLETS BY MOUTH AT BEDTIME  . aspirin EC 81 MG tablet Take 81 mg by mouth every 6 (six) hours as needed (CHEST PAIN).  Marland Kitchen baclofen (LIORESAL) 10 MG tablet Take 1 tablet (10 mg total) by mouth 3 (three) times daily.  . betamethasone dipropionate (DIPROLENE) 0.05 % cream APPLY TO AFFECTED AREA TWICE A DAY (Patient taking differently: 1 application to legs once a day as needed for itching.)  . Colchicine 0.6 MG CAPS Take 0.6 mg by mouth 3 (three) times daily as needed. TAKE ONE CAPSULE BY MOUTH TWICE DAILY  . CONSTULOSE 10 GM/15ML solution take TWO tablespoonsful (30 mls) BY MOUTH TWICE DAILY  . diltiazem (CARDIZEM SR) 90 MG 12 hr capsule Take 1 capsule (90 mg total) by mouth daily as needed (palpitations).  Marland Kitchen diltiazem (CARTIA XT) 300 MG 24 hr capsule Take 1 capsule by mouth daily  . doxepin (SINEQUAN) 10 MG capsule TAKE ONE CAPSULE BY MOUTH AT BEDTIME  . esomeprazole (NEXIUM) 40 MG capsule Take by mouth.  . ezetimibe (ZETIA) 10 MG tablet TAKE  1 TABLET BY MOUTH EVERY DAY  . fluticasone (FLONASE) 50 MCG/ACT nasal spray Place 2 sprays into both nostrils daily as needed. For allergies  . folic acid (FOLVITE) 1 MG tablet TAKE ONE TABLET BY MOUTH ONCE DAILY - EMERGENCY REFILL FAXED DR.  Marland Kitchen gabapentin (NEURONTIN) 800 MG tablet Take 1 tablet (800 mg total) by mouth 4 (four) times daily.  Marland Kitchen glucose blood (ONETOUCH VERIO) test strip Test blood sugar daily Dx E11.9  . glucose blood test strip 1  each by Other route as needed for other. Use as instructed  . hydrocortisone 1 % ointment Apply 1 application topically 3 (three) times daily as needed. (Patient taking differently: Apply 1 application topically 3 (three) times daily as needed for itching ("leg pain"). )  . HYDROmorphone (DILAUDID) 4 MG tablet TAKE 1 TABLET BY MOUTH EVERY DAY AS NEEDED FOR PAIN  . ibuprofen (ADVIL,MOTRIN) 400 MG tablet Take 1 tablet (400 mg total) by mouth every 8 (eight) hours as needed. for pain  . indomethacin (INDOCIN) 50 MG capsule Take 1 capsule (50 mg total) by mouth 3 (three) times daily with meals.  Marland Kitchen ipratropium (ATROVENT) 0.02 % nebulizer solution Take 2.5 mLs (500 mcg total) by nebulization 4 (four) times daily as needed. For wheezing (Patient taking differently: Take 500 mcg by nebulization 4 (four) times daily as needed for wheezing or shortness of breath. For wheezing)  . lidocaine (XYLOCAINE) 5 % ointment Apply 1 application topically 4 (four) times daily as needed. To knees and ankle as needed for pain  . meloxicam (MOBIC) 15 MG tablet TAKE 1 TABLET BY MOUTH EVERY DAY  . Menthol-Methyl Salicylate (MUSCLE RUB) 10-15 % CREA Apply 1 application topically 3 (three) times daily as needed for muscle pain. For muscle pain   . metolazone (ZAROXOLYN) 2.5 MG tablet TAKE 1 TABLET BY MOUTH 1 & 1/2 HOURS PRIOR TO TORSEMIDE FOR 3 DAYS AS NEEDED FOR SEVERE SWELLING  . milk thistle 175 MG tablet Take 175 mg by mouth 4 (four) times daily.   . Multiple Vitamins-Minerals (MULTI COMPLETE PO) Take 1 tablet by mouth daily.   . naloxone (NARCAN) nasal spray 4 mg/0.1 mL Use as needed for accidental overdose  . neomycin-bacitracin-polymyxin (NEOSPORIN) ointment Apply 1 application topically every 12 (twelve) hours as needed for wound care. Reported on 06/01/2015  . nitroGLYCERIN (NITROSTAT) 0.4 MG SL tablet Place 1 tablet (0.4 mg total) under the tongue every 5 (five) minutes as needed for chest pain (may repeat x3).  .  potassium chloride SA (K-DUR,KLOR-CON) 20 MEQ tablet TAKE 1 TABLET BY MOUTH TWICE DAILY  . pravastatin (PRAVACHOL) 10 MG tablet Take 1 tablet (10 mg total) by mouth daily.  . propranolol (INDERAL) 20 MG tablet Take 1 tablet (20 mg total) by mouth daily.  . sodium chloride (OCEAN) 0.65 % nasal spray Place into the nose.  . torsemide (DEMADEX) 20 MG tablet TAKE TWO TABLETS BY MOUTH in THE morning AND take ONE tablet BY MOUTH AT LUNCH time  . traMADol (ULTRAM) 50 MG tablet TAKE 1 OR 2 TABLETS BY MOUTH THREE TIMES DAILY AS NEEDED  . vitamin B-12 (CYANOCOBALAMIN) 1000 MCG tablet Take by mouth.  . VOLTAREN 1 % GEL Apply FOUR grams topically FOUR times daily as needed for pain   No facility-administered encounter medications on file as of 09/05/2018.     Review of Systems  Constitutional: Negative for chills and fever.  Respiratory: Negative for shortness of breath and wheezing.   Cardiovascular: Negative for chest  pain and leg swelling.  Musculoskeletal: Negative for back pain and gait problem.  Skin: Negative for rash.  Neurological: Negative for dizziness, weakness and numbness.  Psychiatric/Behavioral: Positive for sleep disturbance. Negative for decreased concentration. The patient is not nervous/anxious.   All other systems reviewed and are negative.   Observations/Objective: Patient sounds comfortable and in no acute distress  Assessment and Plan: Problem List Items Addressed This Visit      Cardiovascular and Mediastinum   Hypertension associated with diabetes ([place])   Relevant Orders   CMP14+EGFR     Endocrine   T2DM (type 2 diabetes mellitus) ([place]) - Primary   Relevant Orders   Bayer DCA Hb A1c Waived   Hyperlipidemia associated with type 2 diabetes mellitus ([place])   Relevant Orders   Lipid panel     Other   Thrombocytopenia ([place])   Relevant Orders   CBC with Differential/Platelet   Depression, recurrent ([facility])   Relevant Medications   ALPRAZolam (XANAX) 1 MG tablet        Follow Up Instructions: F/u in 3 months for depresion Xanax 73m and reduce to one per day and he is on hydromorphone form pain management   I discussed the assessment and treatment plan with the patient. The patient was provided an opportunity to ask questions and all were answered. The patient agreed with the plan and demonstrated an understanding of the instructions.   The patient was advised to call back or seek an in-person evaluation if the symptoms worsen or if the condition fails to improve as anticipated.  The above assessment and management plan was discussed with the patient. The patient verbalized understanding of and has agreed to the management plan. Patient is aware to call the clinic if symptoms persist or worsen. Patient is aware when to return to the clinic for a follow-up visit. Patient educated on when it is appropriate to go to the emergency department.    I provided 22 minutes of non-face-to-face time during this encounter.    JWorthy Rancher MD

## 2018-09-08 ENCOUNTER — Telehealth: Payer: Self-pay | Admitting: Family Medicine

## 2018-09-08 ENCOUNTER — Ambulatory Visit: Payer: Medicare HMO | Admitting: Family Medicine

## 2018-09-10 DIAGNOSIS — E662 Morbid (severe) obesity with alveolar hypoventilation: Secondary | ICD-10-CM | POA: Diagnosis not present

## 2018-09-10 DIAGNOSIS — R42 Dizziness and giddiness: Secondary | ICD-10-CM | POA: Diagnosis not present

## 2018-09-10 DIAGNOSIS — M130 Polyarthritis, unspecified: Secondary | ICD-10-CM | POA: Diagnosis not present

## 2018-09-10 DIAGNOSIS — G894 Chronic pain syndrome: Secondary | ICD-10-CM | POA: Diagnosis not present

## 2018-09-12 ENCOUNTER — Other Ambulatory Visit (HOSPITAL_COMMUNITY): Payer: Self-pay

## 2018-09-12 DIAGNOSIS — D61818 Other pancytopenia: Secondary | ICD-10-CM

## 2018-09-12 DIAGNOSIS — D696 Thrombocytopenia, unspecified: Secondary | ICD-10-CM

## 2018-09-12 DIAGNOSIS — D708 Other neutropenia: Secondary | ICD-10-CM

## 2018-09-16 ENCOUNTER — Other Ambulatory Visit: Payer: Self-pay | Admitting: Family Medicine

## 2018-09-16 ENCOUNTER — Inpatient Hospital Stay (HOSPITAL_COMMUNITY): Payer: Medicare HMO | Attending: Hematology

## 2018-09-16 ENCOUNTER — Other Ambulatory Visit: Payer: Self-pay

## 2018-09-16 DIAGNOSIS — M1A9XX0 Chronic gout, unspecified, without tophus (tophi): Secondary | ICD-10-CM

## 2018-09-16 DIAGNOSIS — G8929 Other chronic pain: Secondary | ICD-10-CM | POA: Insufficient documentation

## 2018-09-16 DIAGNOSIS — I81 Portal vein thrombosis: Secondary | ICD-10-CM | POA: Insufficient documentation

## 2018-09-16 DIAGNOSIS — R69 Illness, unspecified: Secondary | ICD-10-CM | POA: Diagnosis not present

## 2018-09-16 DIAGNOSIS — E1159 Type 2 diabetes mellitus with other circulatory complications: Secondary | ICD-10-CM

## 2018-09-16 DIAGNOSIS — K7030 Alcoholic cirrhosis of liver without ascites: Secondary | ICD-10-CM | POA: Insufficient documentation

## 2018-09-16 DIAGNOSIS — K449 Diaphragmatic hernia without obstruction or gangrene: Secondary | ICD-10-CM | POA: Insufficient documentation

## 2018-09-16 DIAGNOSIS — Z23 Encounter for immunization: Secondary | ICD-10-CM | POA: Diagnosis not present

## 2018-09-16 DIAGNOSIS — D696 Thrombocytopenia, unspecified: Secondary | ICD-10-CM

## 2018-09-16 DIAGNOSIS — I152 Hypertension secondary to endocrine disorders: Secondary | ICD-10-CM

## 2018-09-16 DIAGNOSIS — I5032 Chronic diastolic (congestive) heart failure: Secondary | ICD-10-CM

## 2018-09-16 DIAGNOSIS — I480 Paroxysmal atrial fibrillation: Secondary | ICD-10-CM

## 2018-09-16 DIAGNOSIS — D708 Other neutropenia: Secondary | ICD-10-CM

## 2018-09-16 DIAGNOSIS — D61818 Other pancytopenia: Secondary | ICD-10-CM

## 2018-09-16 LAB — CBC WITH DIFFERENTIAL/PLATELET
Abs Immature Granulocytes: 0.02 10*3/uL (ref 0.00–0.07)
Basophils Absolute: 0.1 10*3/uL (ref 0.0–0.1)
Basophils Relative: 2 %
Eosinophils Absolute: 0.2 10*3/uL (ref 0.0–0.5)
Eosinophils Relative: 6 %
HCT: 40.7 % (ref 39.0–52.0)
Hemoglobin: 13.5 g/dL (ref 13.0–17.0)
Immature Granulocytes: 1 %
Lymphocytes Relative: 21 %
Lymphs Abs: 0.5 10*3/uL — ABNORMAL LOW (ref 0.7–4.0)
MCH: 29 pg (ref 26.0–34.0)
MCHC: 33.2 g/dL (ref 30.0–36.0)
MCV: 87.3 fL (ref 80.0–100.0)
Monocytes Absolute: 0.3 10*3/uL (ref 0.1–1.0)
Monocytes Relative: 12 %
Neutro Abs: 1.5 10*3/uL — ABNORMAL LOW (ref 1.7–7.7)
Neutrophils Relative %: 58 %
Platelets: 52 10*3/uL — ABNORMAL LOW (ref 150–400)
RBC: 4.66 MIL/uL (ref 4.22–5.81)
RDW: 14 % (ref 11.5–15.5)
WBC: 2.5 10*3/uL — ABNORMAL LOW (ref 4.0–10.5)
nRBC: 0 % (ref 0.0–0.2)

## 2018-09-16 LAB — COMPREHENSIVE METABOLIC PANEL
ALT: 45 U/L — ABNORMAL HIGH (ref 0–44)
AST: 76 U/L — ABNORMAL HIGH (ref 15–41)
Albumin: 3.9 g/dL (ref 3.5–5.0)
Alkaline Phosphatase: 99 U/L (ref 38–126)
Anion gap: 12 (ref 5–15)
BUN: 8 mg/dL (ref 6–20)
CO2: 20 mmol/L — ABNORMAL LOW (ref 22–32)
Calcium: 9.2 mg/dL (ref 8.9–10.3)
Chloride: 103 mmol/L (ref 98–111)
Creatinine, Ser: 0.7 mg/dL (ref 0.61–1.24)
GFR calc Af Amer: >60 mL/min (ref >60)
GFR calc non Af Amer: >60 mL/min (ref >60)
Glucose, Bld: 114 mg/dL — ABNORMAL HIGH (ref 70–99)
Potassium: 4 mmol/L (ref 3.5–5.1)
Sodium: 135 mmol/L (ref 135–145)
Total Bilirubin: 1.3 mg/dL — ABNORMAL HIGH (ref 0.3–1.2)
Total Protein: 6.9 g/dL (ref 6.5–8.1)

## 2018-09-24 ENCOUNTER — Inpatient Hospital Stay (HOSPITAL_BASED_OUTPATIENT_CLINIC_OR_DEPARTMENT_OTHER): Payer: Medicare HMO | Admitting: Hematology

## 2018-09-24 ENCOUNTER — Other Ambulatory Visit: Payer: Self-pay

## 2018-09-24 VITALS — BP 144/84 | HR 110 | Temp 98.2°F | Resp 18 | Wt 389.9 lb

## 2018-09-24 DIAGNOSIS — R69 Illness, unspecified: Secondary | ICD-10-CM | POA: Diagnosis not present

## 2018-09-24 DIAGNOSIS — G8929 Other chronic pain: Secondary | ICD-10-CM | POA: Diagnosis not present

## 2018-09-24 DIAGNOSIS — I81 Portal vein thrombosis: Secondary | ICD-10-CM | POA: Diagnosis not present

## 2018-09-24 DIAGNOSIS — K449 Diaphragmatic hernia without obstruction or gangrene: Secondary | ICD-10-CM | POA: Diagnosis not present

## 2018-09-24 DIAGNOSIS — Z23 Encounter for immunization: Secondary | ICD-10-CM | POA: Diagnosis not present

## 2018-09-24 DIAGNOSIS — D696 Thrombocytopenia, unspecified: Secondary | ICD-10-CM | POA: Diagnosis not present

## 2018-09-24 DIAGNOSIS — Z0000 Encounter for general adult medical examination without abnormal findings: Secondary | ICD-10-CM

## 2018-09-24 MED ORDER — INFLUENZA VAC SPLIT QUAD 0.5 ML IM SUSY
PREFILLED_SYRINGE | INTRAMUSCULAR | Status: AC
  Filled 2018-09-24: qty 0.5

## 2018-09-24 MED ORDER — INFLUENZA VAC SPLIT QUAD 0.5 ML IM SUSY
0.5000 mL | PREFILLED_SYRINGE | Freq: Once | INTRAMUSCULAR | Status: AC
  Administered 2018-09-24: 0.5 mL via INTRAMUSCULAR

## 2018-09-24 NOTE — Progress Notes (Signed)
[place], [place] [address]   CLINIC:  Medical Oncology/Hematology  PCP:  Dettinger, Fransisca Kaufmann, MD [place] [id number] [phone]   REASON FOR VISIT:  Follow-up for Thrombocytopenia   CURRENT THERAPY: Clinical Surveillance    INTERVAL HISTORY:  [name] 53 y.o. male presents today for follow up. Reports overall doing fair. Reports moderate fatigue. Denies any obvious signs of bleeding. Reports easy bruising. Reports SOB on exertion that resolves with rest. Continues with reports of chronic pain. He is followed by pain management. Denies any changes in bowel habits. Appetite is stable. No weight loss. No fevers, chills, night sweats.    REVIEW OF SYSTEMS:  Review of Systems  Constitutional: Positive for fatigue.  HENT:  Negative.   Eyes: Negative.   Respiratory: Positive for shortness of breath.   Cardiovascular: Positive for leg swelling.  Gastrointestinal: Negative.   Endocrine: Negative.   Genitourinary: Negative.    Musculoskeletal: Positive for arthralgias, gait problem and myalgias.  Skin: Negative.   Neurological: Positive for gait problem.  Hematological: Negative.   Psychiatric/Behavioral: Negative.      PAST MEDICAL/SURGICAL HISTORY:  Past Medical History:  Diagnosis Date  . Anxiety   . Atrial fibrillation ([place])   . CHF (congestive heart failure) ([place])   . Cirrhosis ([facility])    NASH  . Constipation   . COPD (chronic obstructive pulmonary disease) ([place])   . Depression, recurrent ([facility]) 07/18/2016  . Diabetes mellitus    type 2  . GERD (gastroesophageal reflux disease)   . Gout   . Hypertension associated with diabetes ([place] [facility])   . Leukopenia 07/08/2015  . Neuromuscular disorder ([facility])    neuropathy in hands and feet  . Psoriasis   . RA (rheumatoid arthritis) ([place])   . Sleep apnea    cpap used- level 10 and greater  . Thrombocytopenia ([place])    Past Surgical History:  Procedure Laterality Date   . ANKLE SURGERY     right ankle talor repair  . CHOLECYSTECTOMY  sept 2016  . CHOLECYSTECTOMY    . COLONOSCOPY  05/08/2011   Procedure: COLONOSCOPY;  Surgeon: Rogene Houston, MD;  Location[facility];  Service: Endoscopy;  Laterality: N/A;  730  . ESOPHAGOGASTRODUODENOSCOPY  02/28/2011   Procedure: ESOPHAGOGASTRODUODENOSCOPY (EGD);  Surgeon: Rogene Houston, MD;  Location: [facility];  Service: Endoscopy;  Laterality: N/A;  1200  . ESOPHAGOGASTRODUODENOSCOPY N/A 06/11/2012   Procedure: ESOPHAGOGASTRODUODENOSCOPY (EGD);  Surgeon: Rogene Houston, MD;  Location: [facility];  Service: Endoscopy;  Laterality: N/A;  1200  FYI patient is 400 pounds  . ESOPHAGOGASTRODUODENOSCOPY (EGD) WITH PROPOFOL N/A 04/21/2014   Procedure: ESOPHAGOGASTRODUODENOSCOPY (EGD) WITH PROPOFOL;  Surgeon: Rogene Houston, MD;  Location: [facility];  Service: Endoscopy;  Laterality: N/A;  . HERNIA REPAIR Right    as child; inguinal  . HERNIA REPAIR  sept 2016  . LIVER BIOPSY  2012  . SCIATIC NERVE EXPLORATION    . TONSILLECTOMY       SOCIAL HISTORY:  Social History   Socioeconomic History  . Marital status: Divorced    Spouse name: Not on file  . Number of children: Not on file  . Years of education: Not on file  . Highest education level: Not on file  Occupational History  . Not on file  Social Needs  . Financial resource strain: Not on file  . Food insecurity    Worry: Not on file  Inability: Not on file  . Transportation needs    Medical: Not on file    Non-medical: Not on file  Tobacco Use  . Smoking status: Never Smoker  . Smokeless tobacco: Current User    Types: Snuff, Chew  . Tobacco comment: Pt reports that he "dips"  Substance and Sexual Activity  . Alcohol use: No    Alcohol/week: 1.0 standard drinks    Types: 1 Cans of beer per week    Comment: stopped drinking 2-3 yrs ago.   . Drug use: No    Comment: Use to smoke cocaine and marijuana. No IV drug use  . Sexual activity: Yes   Lifestyle  . Physical activity    Days per week: Not on file    Minutes per session: Not on file  . Stress: Not on file  Relationships  . Social Herbalist on phone: Not on file    Gets together: Not on file    Attends religious service: Not on file    Active member of club or organization: Not on file    Attends meetings of clubs or organizations: Not on file    Relationship status: Not on file  . Intimate partner violence    Fear of current or ex partner: Not on file    Emotionally abused: Not on file    Physically abused: Not on file    Forced sexual activity: Not on file  Other Topics Concern  . Not on file  Social History Narrative  . Not on file    FAMILY HISTORY:  Family History  Problem Relation Age of Onset  . Colon cancer Mother   . Cancer Mother        intestine  . Cancer Father        throat  . Cancer Brother        brain  . Cancer Sister   . Heart attack Neg Hx   . Stroke Neg Hx     CURRENT MEDICATIONS:  Outpatient Encounter Medications as of 09/24/2018  Medication Sig  . ALPRAZolam (XANAX) 1 MG tablet Take 1 tablet (1 mg total) by mouth at bedtime as needed for anxiety.  . BELBUCA 150 MCG FILM APPLY ONE FILM TO INSIDE OF MOUTH TWICE DAILY  . betamethasone dipropionate (DIPROLENE) 0.05 % cream APPLY TO AFFECTED AREA TWICE A DAY (Patient taking differently: 1 application to legs once a day as needed for itching.)  . Colchicine 0.6 MG CAPS TAKE ONE CAPSULE BY MOUTH TWICE DAILY AS NEEDED  . CONSTULOSE 10 GM/15ML solution take TWO tablespoonsful (30 mls) BY MOUTH TWICE DAILY  . diltiazem (CARTIA XT) 300 MG 24 hr capsule TAKE ONE CAPSULE BY MOUTH EVERY DAY  . doxepin (SINEQUAN) 10 MG capsule TAKE ONE CAPSULE BY MOUTH AT BEDTIME  . esomeprazole (NEXIUM) 40 MG capsule Take 40 mg by mouth daily at 12 noon.   . ezetimibe (ZETIA) 10 MG tablet TAKE 1 TABLET BY MOUTH EVERY DAY  . folic acid (FOLVITE) 1 MG tablet TAKE ONE TABLET BY MOUTH ONCE DAILY -  EMERGENCY REFILL FAXED DR.  Marland Kitchen gabapentin (NEURONTIN) 800 MG tablet Take 1 tablet (800 mg total) by mouth 4 (four) times daily.  Marland Kitchen glucose blood (ONETOUCH VERIO) test strip Test blood sugar daily Dx E11.9  . glucose blood test strip 1 each by Other route as needed for other. Use as instructed  . HYDROmorphone (DILAUDID) 4 MG tablet TAKE 1 TABLET BY MOUTH EVERY DAY  AS NEEDED FOR PAIN  . indomethacin (INDOCIN) 50 MG capsule Take 1 capsule (50 mg total) by mouth 3 (three) times daily with meals.  Marland Kitchen ipratropium (ATROVENT) 0.02 % nebulizer solution Take 2.5 mLs (500 mcg total) by nebulization 4 (four) times daily as needed. For wheezing (Patient taking differently: Take 500 mcg by nebulization 4 (four) times daily as needed for wheezing or shortness of breath. For wheezing)  . lidocaine (XYLOCAINE) 5 % ointment Apply 1 application topically 4 (four) times daily as needed. To knees and ankle as needed for pain  . meloxicam (MOBIC) 15 MG tablet TAKE 1 TABLET BY MOUTH EVERY DAY  . milk thistle 175 MG tablet Take 175 mg by mouth 4 (four) times daily.   . Multiple Vitamins-Minerals (MULTI COMPLETE PO) Take 1 tablet by mouth daily.   . pantoprazole (PROTONIX) 40 MG tablet Take 40 mg by mouth every morning.  . potassium chloride SA (K-DUR) 20 MEQ tablet TAKE 1 TABLET BY MOUTH TWICE DAILY  . pravastatin (PRAVACHOL) 10 MG tablet Take 1 tablet (10 mg total) by mouth daily.  . propranolol (INDERAL) 20 MG tablet Take 1 tablet (20 mg total) by mouth daily.  . sodium chloride (OCEAN) 0.65 % nasal spray Place into the nose.  . torsemide (DEMADEX) 20 MG tablet TAKE TWO TABLETS BY MOUTH in THE morning AND take ONE tablet BY MOUTH AT LUNCH time  . traMADol (ULTRAM) 50 MG tablet TAKE 1 OR 2 TABLETS BY MOUTH THREE TIMES DAILY AS NEEDED  . vitamin B-12 (CYANOCOBALAMIN) 1000 MCG tablet Take by mouth.  . VOLTAREN 1 % GEL Apply FOUR grams topically FOUR times daily as needed for pain  . albuterol (PROAIR HFA) 108 (90 Base)  MCG/ACT inhaler Inhale 2 puffs into the lungs every 6 (six) hours as needed for wheezing or shortness of breath. (Patient not taking: Reported on 09/24/2018)  . aspirin EC 81 MG tablet Take 81 mg by mouth every 6 (six) hours as needed (CHEST PAIN).  Marland Kitchen baclofen (LIORESAL) 10 MG tablet Take 1 tablet (10 mg total) by mouth 3 (three) times daily.  Marland Kitchen diltiazem (CARDIZEM SR) 90 MG 12 hr capsule Take 1 capsule (90 mg total) by mouth daily as needed (palpitations). (Patient not taking: Reported on 09/24/2018)  . DULoxetine (CYMBALTA) 60 MG capsule Take 60 mg by mouth as needed.   . fluticasone (FLONASE) 50 MCG/ACT nasal spray Place 2 sprays into both nostrils daily as needed. For allergies (Patient not taking: Reported on 09/24/2018)  . hydrocortisone 1 % ointment Apply 1 application topically 3 (three) times daily as needed. (Patient not taking: Reported on 09/24/2018)  . ibuprofen (ADVIL,MOTRIN) 400 MG tablet Take 1 tablet (400 mg total) by mouth every 8 (eight) hours as needed. for pain (Patient not taking: Reported on 09/24/2018)  . Menthol-Methyl Salicylate (MUSCLE RUB) 10-15 % CREA Apply 1 application topically 3 (three) times daily as needed for muscle pain. For muscle pain   . metFORMIN (GLUCOPHAGE) 500 MG tablet Take 500 mg by mouth as needed.   . metolazone (ZAROXOLYN) 2.5 MG tablet TAKE 1 TABLET BY MOUTH 1 & 1/2 HOURS PRIOR TO TORSEMIDE FOR 3 DAYS AS NEEDED FOR SEVERE SWELLING (Patient not taking: Reported on 09/24/2018)  . naloxone (NARCAN) nasal spray 4 mg/0.1 mL Use as needed for accidental overdose (Patient not taking: Reported on 09/24/2018)  . neomycin-bacitracin-polymyxin (NEOSPORIN) ointment Apply 1 application topically every 12 (twelve) hours as needed for wound care. Reported on 06/01/2015  . nitroGLYCERIN (NITROSTAT)  0.4 MG SL tablet Place 1 tablet (0.4 mg total) under the tongue every 5 (five) minutes as needed for chest pain (may repeat x3). (Patient not taking: Reported on 09/24/2018)  .  [EXPIRED] influenza vac split quadrivalent PF (FLUARIX) injection 0.5 mL    No facility-administered encounter medications on file as of 09/24/2018.     ALLERGIES:  Allergies  Allergen Reactions  . Allopurinol Other (See Comments)    Joint pain worse   . Oxycodone Base Itching  . Prozac [Fluoxetine Hcl] Itching     PHYSICAL EXAM:  ECOG Performance status: 3  Vitals:   09/24/18 0854  BP: (!) 144/84  Pulse: (!) 110  Resp: 18  Temp: 98.2 F (36.8 C)  SpO2: 98%   Filed Weights   09/24/18 0854  Weight: (!) 389 lb 14.4 oz (176.9 kg)    Physical Exam Constitutional:      Appearance: He is obese.  HENT:     Head: Normocephalic.     Right Ear: External ear normal.     Left Ear: External ear normal.     Nose: Nose normal.     Mouth/Throat:     Mouth: Mucous membranes are moist.  Eyes:     Conjunctiva/sclera: Conjunctivae normal.  Neck:     Musculoskeletal: Normal range of motion.  Cardiovascular:     Rate and Rhythm: Regular rhythm. Tachycardia present.     Pulses: Normal pulses.     Heart sounds: Normal heart sounds.  Pulmonary:     Effort: Pulmonary effort is normal.     Breath sounds: Normal breath sounds.  Abdominal:     General: Bowel sounds are normal.  Musculoskeletal:     Right lower leg: Edema present.     Left lower leg: Edema present.  Skin:    Findings: Rash present.  Neurological:     General: No focal deficit present.     Mental Status: He is alert and oriented to person, place, and time.  Psychiatric:        Mood and Affect: Mood normal.        Behavior: Behavior normal.        Thought Content: Thought content normal.        Judgment: Judgment normal.      LABORATORY DATA:  I have reviewed the labs as listed.  CBC    Component Value Date/Time   WBC 2.5 (L) 09/16/2018 1341   RBC 4.66 09/16/2018 1341   HGB 13.5 09/16/2018 1341   HGB 13.3 02/17/2018 1055   HCT 40.7 09/16/2018 1341   HCT 39.7 02/17/2018 1055   PLT 52 (L) 09/16/2018  1341   PLT 91 (LL) 02/17/2018 1055   MCV 87.3 09/16/2018 1341   MCV 89 02/17/2018 1055   MCH 29.0 09/16/2018 1341   MCHC 33.2 09/16/2018 1341   RDW 14.0 09/16/2018 1341   RDW 13.8 02/17/2018 1055   LYMPHSABS 0.5 (L) 09/16/2018 1341   LYMPHSABS 0.8 02/17/2018 1055   MONOABS 0.3 09/16/2018 1341   EOSABS 0.2 09/16/2018 1341   EOSABS 0.0 02/17/2018 1055   BASOSABS 0.1 09/16/2018 1341   BASOSABS 0.0 02/17/2018 1055   CMP Latest Ref Rng & Units 09/16/2018 02/17/2018 09/11/2017  Glucose 70 - 99 mg/dL 114(H) 250(H) 121(H)  BUN 6 - 20 mg/dL 8 18 20   Creatinine 0.61 - 1.24 mg/dL 0.70 0.94 1.29(H)  Sodium 135 - 145 mmol/L 135 132(L) 142  Potassium 3.5 - 5.1 mmol/L 4.0 5.0 3.0(L)  Chloride 98 -  111 mmol/L 103 98 95(L)  CO2 22 - 32 mmol/L 20(L) 16(L) 29  Calcium 8.9 - 10.3 mg/dL 9.2 9.0 9.4  Total Protein 6.5 - 8.1 g/dL 6.9 6.5 7.1  Total Bilirubin 0.3 - 1.2 mg/dL 1.3(H) 1.8(H) 0.7  Alkaline Phos 38 - 126 U/L 99 135(H) 102  AST 15 - 41 U/L 76(H) 34 34  ALT 0 - 44 U/L 45(H) 28 26       ASSESSMENT & PLAN:   Thrombocytopenia ([place]) 1. Pancytopenia  - Secondary to cirrhosis due to EtOH and possible NAFLD and splenomegaly.  - Status post bone marrow aspiration and biopsy on 10/24/2015 that demonstrated normal cellular bone marrow with trilineage hematopoiesis and normal cytogenetics. - Abdominal  ultrasound performed on April 2018 was consistent with cirrhosis of the liver.  Of note, there was a thrombus noted in the main portal vein that appeared to be nonocclusive.  Spleen was measured at 17.2 cm. -Labs from September 8 reveal a white blood cell count of 2.5, ANC 1.5, hemoglobin 13.5, platelet count 52,000.  Patient has no signs of bleeding. -We will continue to monitor labs closely. -Return to clinic in 4 months.  2.  Transaminitis secondary to cirrhosis of the liver - Recommend patient follow up with GI.  3. Hiatal Hernia - Pt states he experiences abdominal pain when he coughs or bears  down. I will make a referral to general surgery for recommendations. High Risk due to portal vein thrombosis.        Orders placed this encounter:  Orders Placed This Encounter  Procedures  . CBC with Differential  . Comprehensive metabolic panel  . AFP tumor marker      [facility] [phone]

## 2018-09-24 NOTE — Assessment & Plan Note (Signed)
1. Pancytopenia  - Secondary to cirrhosis due to EtOH and possible NAFLD and splenomegaly.  - Status post bone marrow aspiration and biopsy on 10/24/2015 that demonstrated normal cellular bone marrow with trilineage hematopoiesis and normal cytogenetics. - Abdominal  ultrasound performed on April 2018 was consistent with cirrhosis of the liver.  Of note, there was a thrombus noted in the main portal vein that appeared to be nonocclusive.  Spleen was measured at 17.2 cm. -Labs from September 8 reveal a white blood cell count of 2.5, ANC 1.5, hemoglobin 13.5, platelet count 52,000.  Patient has no signs of bleeding. -We will continue to monitor labs closely. -Return to clinic in 4 months.  2.  Transaminitis secondary to cirrhosis of the liver - Recommend patient follow up with GI.  3. Hiatal Hernia - Pt states he experiences abdominal pain when he coughs or bears down. I will make a referral to general surgery for recommendations. High Risk due to portal vein thrombosis.

## 2018-09-25 ENCOUNTER — Other Ambulatory Visit: Payer: Self-pay | Admitting: Family

## 2018-09-25 ENCOUNTER — Ambulatory Visit (INDEPENDENT_AMBULATORY_CARE_PROVIDER_SITE_OTHER): Payer: Medicare HMO | Admitting: Family

## 2018-09-25 ENCOUNTER — Encounter: Payer: Self-pay | Admitting: Family

## 2018-09-25 ENCOUNTER — Encounter (HOSPITAL_COMMUNITY): Payer: Self-pay | Admitting: Lab

## 2018-09-25 DIAGNOSIS — R509 Fever, unspecified: Secondary | ICD-10-CM | POA: Diagnosis not present

## 2018-09-25 DIAGNOSIS — R399 Unspecified symptoms and signs involving the genitourinary system: Secondary | ICD-10-CM

## 2018-09-25 MED ORDER — CIPROFLOXACIN HCL 500 MG PO TABS: 500.0000 mg | ORAL_TABLET | Freq: Two times a day (BID) | ORAL | 0 refills | Status: DC

## 2018-09-25 NOTE — Progress Notes (Unsigned)
Referral to [facility] for Hiatal Hernia.  Records faxed on 9/17

## 2018-09-25 NOTE — Progress Notes (Signed)
Virtual Visit via telephone Note Due to COVID-19 pandemic this visit was conducted virtually. This visit type was conducted due to national recommendations for restrictions regarding the COVID-19 Pandemic (e.g. social distancing, sheltering in place) in an effort to limit this patient's exposure and mitigate transmission in our community. All issues noted in this document were discussed and addressed.  A physical exam was not performed with this format.  I connected with [name] on 09/25/18 at 1:40 pm by telephone and verified that I am speaking with the correct person using two identifiers. [name] is currently located at home and no one is currently with  Him  during visit. The provider, Evelina Dun, FNP is located in their office at time of visit.  I discussed the limitations, risks, security and privacy concerns of performing an evaluation and management service by telephone and the availability of in person appointments. I also discussed with the patient that there may be a patient responsible charge related to this service. The patient expressed understanding and agreed to proceed.   History and Present Illness:  Pt calls the office today with complaints of flu like symptoms. He states he got the flu vaccine yesterday and when he got home he felt body aches, fever, and back pain. He states his fever was a 101 F. Fever  This is a new problem. The current episode started yesterday. The problem occurs constantly. The problem has been waxing and waning. Associated symptoms include muscle aches, sleepiness and urinary pain. Pertinent negatives include no congestion, coughing, diarrhea, ear pain or headaches. He has tried fluids for the symptoms. The treatment provided mild relief.      Review of Systems  Constitutional: Positive for fever.  HENT: Negative for congestion and ear pain.   Respiratory: Negative for cough.   Gastrointestinal: Negative for diarrhea.   Genitourinary: Positive for dysuria.  Neurological: Negative for headaches.     Observations/Objective: No SOB or distress noted  Assessment and Plan: 1. Fever, unspecified fever cause - Urinalysis, Complete; Future - Urine Culture; Future - ciprofloxacin (CIPRO) 500 MG tablet; Take 1 tablet (500 mg total) by mouth 2 (two) times daily.  Dispense: 14 tablet; Refill: 0  2. UTI symptoms - Urinalysis, Complete; Future - Urine Culture; Future - ciprofloxacin (CIPRO) 500 MG tablet; Take 1 tablet (500 mg total) by mouth 2 (two) times daily.  Dispense: 14 tablet; Refill: 0  Pt will have friend drop off urine He will get COVID tested Tylenol as needed Start cipro after doing sample Call office if symptoms worsen or do not improve    I discussed the assessment and treatment plan with the patient. The patient was provided an opportunity to ask questions and all were answered. The patient agreed with the plan and demonstrated an understanding of the instructions.   The patient was advised to call back or seek an in-person evaluation if the symptoms worsen or if the condition fails to improve as anticipated.  The above assessment and management plan was discussed with the patient. The patient verbalized understanding of and has agreed to the management plan. Patient is aware to call the clinic if symptoms persist or worsen. Patient is aware when to return to the clinic for a follow-up visit. Patient educated on when it is appropriate to go to the emergency department.   Time call ended: 1:57 pm     I provided 17 minutes of non-face-to-face time during this encounter.    Evelina Dun, FNP

## 2018-09-26 ENCOUNTER — Ambulatory Visit (INDEPENDENT_AMBULATORY_CARE_PROVIDER_SITE_OTHER): Payer: Medicare HMO | Admitting: Family

## 2018-09-26 ENCOUNTER — Encounter: Payer: Self-pay | Admitting: Family

## 2018-09-26 DIAGNOSIS — R197 Diarrhea, unspecified: Secondary | ICD-10-CM

## 2018-09-26 NOTE — Progress Notes (Signed)
   Virtual Visit via telephone Note Due to COVID-19 pandemic this visit was conducted virtually. This visit type was conducted due to national recommendations for restrictions regarding the COVID-19 Pandemic (e.g. social distancing, sheltering in place) in an effort to limit this patient's exposure and mitigate transmission in our community. All issues noted in this document were discussed and addressed.  A physical exam was not performed with this format.  I connected with [name] on 09/26/18 at 11:15 AM by telephone and verified that I am speaking with the correct person using two identifiers. [name] is currently located in car and no one is currently with him during visit. The provider, Evelina Dun, FNP is located in their office at time of visit.  I discussed the limitations, risks, security and privacy concerns of performing an evaluation and management service by telephone and the availability of in person appointments. I also discussed with the patient that there may be a patient responsible charge related to this service. The patient expressed understanding and agreed to proceed.   History and Present Illness:  Diarrhea  This is a recurrent problem. The current episode started more than 1 year ago. The problem occurs less than 2 times per day. The problem has been unchanged. The stool consistency is described as watery. The patient states that diarrhea does not awaken him from sleep. Associated symptoms include increased flatus. Pertinent negatives include no abdominal pain, bloating, coughing, fever or vomiting. Nothing aggravates the symptoms. There are no known risk factors. He has tried nothing for the symptoms. The treatment provided no relief.      Review of Systems  Constitutional: Negative for fever.  Respiratory: Negative for cough.   Gastrointestinal: Positive for diarrhea and flatus. Negative for abdominal pain, bloating and vomiting.  All other systems  reviewed and are negative.    Observations/Objective: No SOB or distress noted   Assessment and Plan: 1. Diarrhea, unspecified type Increase fiber rich foods to bulk stool up Force fluids Start probiotic  Imodium as needed Keep follow up with PCP Continue Cipro for UTI symptoms    I discussed the assessment and treatment plan with the patient. The patient was provided an opportunity to ask questions and all were answered. The patient agreed with the plan and demonstrated an understanding of the instructions.   The patient was advised to call back or seek an in-person evaluation if the symptoms worsen or if the condition fails to improve as anticipated.  The above assessment and management plan was discussed with the patient. The patient verbalized understanding of and has agreed to the management plan. Patient is aware to call the clinic if symptoms persist or worsen. Patient is aware when to return to the clinic for a follow-up visit. Patient educated on when it is appropriate to go to the emergency department.   Time call ended:  11:24 AM  I provided 9 minutes of non-face-to-face time during this encounter.    Evelina Dun, FNP

## 2018-09-29 ENCOUNTER — Other Ambulatory Visit: Payer: Medicare HMO

## 2018-09-29 ENCOUNTER — Other Ambulatory Visit: Payer: Self-pay

## 2018-09-29 DIAGNOSIS — R399 Unspecified symptoms and signs involving the genitourinary system: Secondary | ICD-10-CM

## 2018-09-29 DIAGNOSIS — R509 Fever, unspecified: Secondary | ICD-10-CM | POA: Diagnosis not present

## 2018-09-29 LAB — URINALYSIS, COMPLETE
Bilirubin, UA: NEGATIVE
Glucose, UA: NEGATIVE
Ketones, UA: NEGATIVE
Leukocytes,UA: NEGATIVE
Nitrite, UA: NEGATIVE
Protein,UA: NEGATIVE
RBC, UA: NEGATIVE
Specific Gravity, UA: 1.01 (ref 1.005–1.030)
Urobilinogen, Ur: 0.2 mg/dL (ref 0.2–1.0)
pH, UA: 6.5 (ref 5.0–7.5)

## 2018-09-29 LAB — MICROSCOPIC EXAMINATION
Bacteria, UA: NONE SEEN
Epithelial Cells (non renal): NONE SEEN /hpf (ref 0–10)
RBC, Urine: NONE SEEN /hpf (ref 0–2)
Renal Epithel, UA: NONE SEEN /hpf
WBC, UA: NONE SEEN /hpf (ref 0–5)

## 2018-09-30 LAB — URINE CULTURE: Organism ID, Bacteria: NO GROWTH

## 2018-10-08 DIAGNOSIS — R42 Dizziness and giddiness: Secondary | ICD-10-CM | POA: Diagnosis not present

## 2018-10-08 DIAGNOSIS — G894 Chronic pain syndrome: Secondary | ICD-10-CM | POA: Diagnosis not present

## 2018-10-08 DIAGNOSIS — E662 Morbid (severe) obesity with alveolar hypoventilation: Secondary | ICD-10-CM | POA: Diagnosis not present

## 2018-10-08 DIAGNOSIS — M130 Polyarthritis, unspecified: Secondary | ICD-10-CM | POA: Diagnosis not present

## 2018-10-15 ENCOUNTER — Other Ambulatory Visit: Payer: Self-pay | Admitting: *Deleted

## 2018-10-15 MED ORDER — BD SWAB SINGLE USE REGULAR PADS: MEDICATED_PAD | 3 refills | Status: DC

## 2018-10-22 DIAGNOSIS — K430 Incisional hernia with obstruction, without gangrene: Secondary | ICD-10-CM | POA: Diagnosis not present

## 2018-10-22 DIAGNOSIS — I81 Portal vein thrombosis: Secondary | ICD-10-CM | POA: Diagnosis not present

## 2018-10-22 DIAGNOSIS — K7460 Unspecified cirrhosis of liver: Secondary | ICD-10-CM | POA: Diagnosis not present

## 2018-10-30 ENCOUNTER — Ambulatory Visit (INDEPENDENT_AMBULATORY_CARE_PROVIDER_SITE_OTHER): Payer: Medicare HMO | Admitting: Family Medicine

## 2018-10-30 ENCOUNTER — Encounter: Payer: Self-pay | Admitting: Family Medicine

## 2018-10-30 DIAGNOSIS — N410 Acute prostatitis: Secondary | ICD-10-CM | POA: Diagnosis not present

## 2018-10-30 MED ORDER — CIPROFLOXACIN HCL 500 MG PO TABS: 500.0000 mg | ORAL_TABLET | Freq: Two times a day (BID) | ORAL | 0 refills | Status: DC

## 2018-10-30 NOTE — Progress Notes (Signed)
Subjective:    Patient ID: [name], male    DOB: 09/02/1965, 53 y.o.   MRN: [id number]   HPI: [name] is a 53 y.o. male presenting for PT can't hold his urine. Walking 15 feet to the bathroom would wet himself. He also has had watery BM once a day for a year. Treated in the past with cipro. Treated again last month and Cipro almost got rid of diarrhea and urinary frequency.    Depression screen [facility] 2/9 07/10/2018 02/17/2018 09/11/2017 06/21/2017 05/10/2017  Decreased Interest 0 2 0 0 0  Down, Depressed, Hopeless 0 0 0 0 0  PHQ - 2 Score 0 2 0 0 0  Altered sleeping - 2 - - -  Tired, decreased energy - 2 - - -  Change in appetite - 2 - - -  Feeling bad or failure about yourself  - 0 - - -  Trouble concentrating - 0 - - -  Moving slowly or fidgety/restless - 0 - - -  Suicidal thoughts - 0 - - -  PHQ-9 Score - 8 - - -  Difficult doing work/chores - - - - -  Some recent data might be hidden     Relevant past medical, surgical, family and social history reviewed and updated as indicated.  Interim medical history since our last visit reviewed. Allergies and medications reviewed and updated.  ROS:  Review of Systems  Constitutional: Negative for fever.  Respiratory: Negative for shortness of breath.   Cardiovascular: Negative for chest pain.  Genitourinary: Positive for difficulty urinating and enuresis.  Musculoskeletal: Negative for arthralgias.  Skin: Negative for rash.     Social History   Tobacco Use  Smoking Status Never Smoker  Smokeless Tobacco Current User  . Types: Snuff, Chew  Tobacco Comment   Pt reports that he "dips"       Objective:     Wt Readings from Last 3 Encounters:  09/24/18 (!) 389 lb 14.4 oz (176.9 kg)  07/10/18 (!) 390 lb (176.9 kg)  04/23/18 (!) 375 lb (170.1 kg)     Exam deferred. Pt. Harboring due to COVID 19. Phone visit performed.   Assessment & Plan:   1. Acute prostatitis     Meds ordered this encounter   Medications  . ciprofloxacin (CIPRO) 500 MG tablet    Sig: Take 1 tablet (500 mg total) by mouth 2 (two) times daily.    Dispense:  28 tablet    Refill:  0    No orders of the defined types were placed in this encounter.     Diagnoses and all orders for this visit:  Acute prostatitis  Other orders -     ciprofloxacin (CIPRO) 500 MG tablet; Take 1 tablet (500 mg total) by mouth 2 (two) times daily.    Virtual Visit via telephone Note  I discussed the limitations, risks, security and privacy concerns of performing an evaluation and management service by telephone and the availability of in person appointments. The patient was identified with two identifiers. Pt.expressed understanding and agreed to proceed. Pt. Is at home. Dr. Livia Snellen is in his office.  Follow Up Instructions:   I discussed the assessment and treatment plan with the patient. The patient was provided an opportunity to ask questions and all were answered. The patient agreed with the plan and demonstrated an understanding of the instructions.   The patient was advised to call back or seek an in-person evaluation if the  symptoms worsen or if the condition fails to improve as anticipated.   Total minutes including chart review and phone contact time: 15   Follow up plan: Return if symptoms worsen or fail to improve.  Claretta Fraise, MD [place]

## 2018-11-05 DIAGNOSIS — R42 Dizziness and giddiness: Secondary | ICD-10-CM | POA: Diagnosis not present

## 2018-11-05 DIAGNOSIS — E662 Morbid (severe) obesity with alveolar hypoventilation: Secondary | ICD-10-CM | POA: Diagnosis not present

## 2018-11-05 DIAGNOSIS — M130 Polyarthritis, unspecified: Secondary | ICD-10-CM | POA: Diagnosis not present

## 2018-11-05 DIAGNOSIS — G894 Chronic pain syndrome: Secondary | ICD-10-CM | POA: Diagnosis not present

## 2018-11-12 ENCOUNTER — Ambulatory Visit: Payer: PRIVATE HEALTH INSURANCE | Admitting: Psychiatry

## 2018-11-13 ENCOUNTER — Ambulatory Visit: Payer: Medicare HMO | Admitting: Family Medicine

## 2018-11-13 ENCOUNTER — Other Ambulatory Visit: Payer: Self-pay | Admitting: Family Medicine

## 2018-11-17 ENCOUNTER — Other Ambulatory Visit: Payer: Self-pay | Admitting: Family Medicine

## 2018-12-01 ENCOUNTER — Other Ambulatory Visit: Payer: Self-pay | Admitting: Family Medicine

## 2018-12-01 DIAGNOSIS — F339 Major depressive disorder, recurrent, unspecified: Secondary | ICD-10-CM

## 2018-12-02 ENCOUNTER — Telehealth: Payer: Self-pay | Admitting: Family Medicine

## 2018-12-02 DIAGNOSIS — G894 Chronic pain syndrome: Secondary | ICD-10-CM | POA: Diagnosis not present

## 2018-12-02 DIAGNOSIS — M130 Polyarthritis, unspecified: Secondary | ICD-10-CM | POA: Diagnosis not present

## 2018-12-02 DIAGNOSIS — E662 Morbid (severe) obesity with alveolar hypoventilation: Secondary | ICD-10-CM | POA: Diagnosis not present

## 2018-12-02 NOTE — Telephone Encounter (Signed)
Left detailed message per dpr- is up to date on PNA.

## 2018-12-05 ENCOUNTER — Ambulatory Visit: Payer: Self-pay | Admitting: Family Medicine

## 2018-12-09 ENCOUNTER — Other Ambulatory Visit: Payer: Self-pay | Admitting: Family Medicine

## 2018-12-09 DIAGNOSIS — M1A9XX0 Chronic gout, unspecified, without tophus (tophi): Secondary | ICD-10-CM

## 2018-12-12 ENCOUNTER — Encounter: Payer: Self-pay | Admitting: Family Medicine

## 2018-12-12 ENCOUNTER — Ambulatory Visit (INDEPENDENT_AMBULATORY_CARE_PROVIDER_SITE_OTHER): Payer: Medicare HMO | Admitting: Family Medicine

## 2018-12-12 DIAGNOSIS — E1165 Type 2 diabetes mellitus with hyperglycemia: Secondary | ICD-10-CM | POA: Diagnosis not present

## 2018-12-12 DIAGNOSIS — E1159 Type 2 diabetes mellitus with other circulatory complications: Secondary | ICD-10-CM

## 2018-12-12 DIAGNOSIS — R69 Illness, unspecified: Secondary | ICD-10-CM | POA: Diagnosis not present

## 2018-12-12 DIAGNOSIS — E1169 Type 2 diabetes mellitus with other specified complication: Secondary | ICD-10-CM | POA: Diagnosis not present

## 2018-12-12 DIAGNOSIS — I10 Essential (primary) hypertension: Secondary | ICD-10-CM | POA: Diagnosis not present

## 2018-12-12 DIAGNOSIS — F339 Major depressive disorder, recurrent, unspecified: Secondary | ICD-10-CM | POA: Diagnosis not present

## 2018-12-12 DIAGNOSIS — I152 Hypertension secondary to endocrine disorders: Secondary | ICD-10-CM

## 2018-12-12 DIAGNOSIS — E785 Hyperlipidemia, unspecified: Secondary | ICD-10-CM

## 2018-12-12 MED ORDER — QUETIAPINE FUMARATE 100 MG PO TABS: 100.0000 mg | ORAL_TABLET | Freq: Every day | ORAL | 2 refills | Status: DC

## 2018-12-12 MED ORDER — ALPRAZOLAM 1 MG PO TABS: 1.0000 mg | ORAL_TABLET | Freq: Every evening | ORAL | 0 refills | Status: DC | PRN

## 2018-12-12 NOTE — Progress Notes (Signed)
Virtual Visit via telephone Note  I connected with [name] on 12/12/18 at 1038 by telephone and verified that I am speaking with the correct person using two identifiers. [name] is currently located at home and No other people are currently with her during visit. The provider, Fransisca Kaufmann Chaney Ingram, MD is located in their office at time of visit.  Call ended at 1050  I discussed the limitations, risks, security and privacy concerns of performing an evaluation and management service by telephone and the availability of in person appointments. I also discussed with the patient that there may be a patient responsible charge related to this service. The patient expressed understanding and agreed to proceed.   History and Present Illness: Type 2 diabetes mellitus Patient comes in today for recheck of his diabetes. Patient has been currently taking no medication has been diet controlled and says blood sugar was 107 this morning.. Patient is not currently on an ACE inhibitor/ARB. Patient has not seen an ophthalmologist this year. Patient denies any issues with their feet.   Hyperlipidemia Patient is coming in for recheck of his hyperlipidemia. The patient is currently taking pravastatin. They deny any issues with myalgias or history of liver damage from it. They deny any focal numbness or weakness or chest pain.   Hypertension Patient is currently on diltiazem and doxepin and propranolol and torsemide, and their blood pressure today is unknown because he has not got his blood pressure cuff at home working yet.. Patient denies any lightheadedness or dizziness. Patient denies headaches, blurred vision, chest pains, shortness of breath, or weakness. Denies any side effects from medication and is content with current medication.   Insomnia Patient is still having insomnia especially with the sleep apnea machine and says that the alprazolam 1 mg is not sufficient wonders if he can go back  up to 2.  He is on heavy doses of narcotic pain medication we have been very resistant towards this and actually wanting to reduce him.  We discussed other options and he is willing to try another option to help him calm down at night and sleep. Current rx-Next 1 mg nightly as needed # meds rx- 30 Effectiveness of current meds-not working as well Adverse reactions form meds-none  Pill count performed-No Last drug screen -7/14 2020 ( high risk q70m moderate risk q659mlow risk yearly ) Urine drug screen today- No Was the N[place]onserrateeviewed-yes  If yes were their any concerning findings? -None except for he is on opiates  No flowsheet data found.   Controlled substance contract signed on: 07/22/2018  No diagnosis found.  Outpatient Encounter Medications as of 12/12/2018  Medication Sig  . albuterol (PROAIR HFA) 108 (90 Base) MCG/ACT inhaler Inhale 2 puffs into the lungs every 6 (six) hours as needed for wheezing or shortness of breath. (Patient not taking: Reported on 09/24/2018)  . Alcohol Swabs (B-D SINGLE USE SWABS REGULAR) PADS Test BS daily Dx E11.9  . ALPRAZolam (XANAX) 1 MG tablet Take 1 tablet (1 mg total) by mouth at bedtime as needed for anxiety.  . Marland Kitchenspirin EC 81 MG tablet Take 81 mg by mouth every 6 (six) hours as needed (CHEST PAIN).  . Marland Kitchenaclofen (LIORESAL) 10 MG tablet TAKE 1 TABLET BY MOUTH THREE TIMES DAILY  . BELBUCA 150 MCG FILM APPLY ONE FILM TO INSIDE OF MOUTH TWICE DAILY  . betamethasone dipropionate (DIPROLENE) 0.05 % cream APPLY TO AFFECTED AREA TWICE A DAY (Patient taking differently: 1 application  to legs once a day as needed for itching.)  . ciprofloxacin (CIPRO) 500 MG tablet Take 1 tablet (500 mg total) by mouth 2 (two) times daily.  . Colchicine 0.6 MG CAPS TAKE ONE CAPSULE BY MOUTH TWICE DAILY AS NEEDED  . CONSTULOSE 10 GM/15ML solution take TWO tablespoonsful (30 mls) BY MOUTH TWICE DAILY  . diltiazem (CARDIZEM SR) 90 MG 12 hr capsule Take 1 capsule (90 mg total) by  mouth daily as needed (palpitations). (Patient not taking: Reported on 09/24/2018)  . diltiazem (CARTIA XT) 300 MG 24 hr capsule TAKE ONE CAPSULE BY MOUTH EVERY DAY  . doxepin (SINEQUAN) 10 MG capsule TAKE ONE CAPSULE BY MOUTH AT BEDTIME  . DULoxetine (CYMBALTA) 60 MG capsule Take 60 mg by mouth as needed.   Marland Kitchen esomeprazole (NEXIUM) 40 MG capsule Take 40 mg by mouth daily at 12 noon.   . ezetimibe (ZETIA) 10 MG tablet TAKE 1 TABLET BY MOUTH EVERY DAY  . fluticasone (FLONASE) 50 MCG/ACT nasal spray Place 2 sprays into both nostrils daily as needed. For allergies (Patient not taking: Reported on 09/24/2018)  . folic acid (FOLVITE) 1 MG tablet TAKE ONE TABLET BY MOUTH ONCE DAILY - EMERGENCY REFILL FAXED DR.  Marland Kitchen gabapentin (NEURONTIN) 800 MG tablet Take 1 tablet (800 mg total) by mouth 4 (four) times daily.  Marland Kitchen glucose blood (ONETOUCH VERIO) test strip Test blood sugar daily Dx E11.9  . glucose blood test strip 1 each by Other route as needed for other. Use as instructed  . hydrocortisone 1 % ointment Apply 1 application topically 3 (three) times daily as needed. (Patient not taking: Reported on 09/24/2018)  . HYDROmorphone (DILAUDID) 4 MG tablet TAKE 1 TABLET BY MOUTH EVERY DAY AS NEEDED FOR PAIN  . ibuprofen (ADVIL,MOTRIN) 400 MG tablet Take 1 tablet (400 mg total) by mouth every 8 (eight) hours as needed. for pain (Patient not taking: Reported on 09/24/2018)  . indomethacin (INDOCIN) 50 MG capsule TAKE ONE CAPSULE BY MOUTH THREE TIMES DAILY WITH A MEAL  . ipratropium (ATROVENT) 0.02 % nebulizer solution Take 2.5 mLs (500 mcg total) by nebulization 4 (four) times daily as needed. For wheezing (Patient taking differently: Take 500 mcg by nebulization 4 (four) times daily as needed for wheezing or shortness of breath. For wheezing)  . lidocaine (XYLOCAINE) 5 % ointment Apply 1 application topically 4 (four) times daily as needed. To knees and ankle as needed for pain  . meloxicam (MOBIC) 15 MG tablet TAKE 1  TABLET BY MOUTH EVERY DAY  . Menthol-Methyl Salicylate (MUSCLE RUB) 10-15 % CREA Apply 1 application topically 3 (three) times daily as needed for muscle pain. For muscle pain   . metFORMIN (GLUCOPHAGE) 500 MG tablet Take 500 mg by mouth as needed.   . metolazone (ZAROXOLYN) 2.5 MG tablet TAKE 1 TABLET BY MOUTH 1 & 1/2 HOURS PRIOR TO TORSEMIDE FOR 3 DAYS AS NEEDED FOR SEVERE SWELLING  . milk thistle 175 MG tablet Take 175 mg by mouth 4 (four) times daily.   . Multiple Vitamins-Minerals (MULTI COMPLETE PO) Take 1 tablet by mouth daily.   . naloxone (NARCAN) nasal spray 4 mg/0.1 mL Use as needed for accidental overdose (Patient not taking: Reported on 09/24/2018)  . neomycin-bacitracin-polymyxin (NEOSPORIN) ointment Apply 1 application topically every 12 (twelve) hours as needed for wound care. Reported on 06/01/2015  . nitroGLYCERIN (NITROSTAT) 0.4 MG SL tablet Place 1 tablet (0.4 mg total) under the tongue every 5 (five) minutes as needed for chest pain (  may repeat x3). (Patient not taking: Reported on 09/24/2018)  . pantoprazole (PROTONIX) 40 MG tablet TAKE 1 TABLET BY MOUTH EVERY MORNING  . potassium chloride SA (K-DUR) 20 MEQ tablet TAKE 1 TABLET BY MOUTH TWICE DAILY  . pravastatin (PRAVACHOL) 10 MG tablet Take 1 tablet (10 mg total) by mouth daily.  . propranolol (INDERAL) 20 MG tablet TAKE 1 TABLET BY MOUTH DAILY  . sodium chloride (OCEAN) 0.65 % nasal spray Place into the nose.  . torsemide (DEMADEX) 20 MG tablet TAKE TWO TABLETS BY MOUTH in THE morning AND take ONE tablet BY MOUTH AT LUNCH time  . traMADol (ULTRAM) 50 MG tablet TAKE 1 OR 2 TABLETS BY MOUTH THREE TIMES DAILY AS NEEDED  . vitamin B-12 (CYANOCOBALAMIN) 1000 MCG tablet Take by mouth.  . VOLTAREN 1 % GEL Apply FOUR grams topically FOUR times daily as needed for pain   No facility-administered encounter medications on file as of 12/12/2018.     Review of Systems  Constitutional: Negative for chills and fever.  Respiratory:  Negative for shortness of breath and wheezing.   Cardiovascular: Negative for chest pain and leg swelling.  Musculoskeletal: Negative for back pain and gait problem.  Skin: Negative for rash.  Psychiatric/Behavioral: Positive for sleep disturbance. Negative for decreased concentration and dysphoric mood. The patient is nervous/anxious.   All other systems reviewed and are negative.   Observations/Objective: Patient sounds comfortable and in no acute distress  Assessment and Plan: Problem List Items Addressed This Visit      Cardiovascular and Mediastinum   Hypertension associated with diabetes ([place])     Endocrine   T2DM (type 2 diabetes mellitus) ([place]) - Primary   Hyperlipidemia associated with type 2 diabetes mellitus ([place])     Other   Depression, recurrent ([place])   Relevant Medications   ALPRAZolam (XANAX) 1 MG tablet      We are tapering off xanax and starting Seroquel  Patient's liver function was slightly up on last time and letter recheck it but due to his symptoms we cannot do that today, he has a chronic cough.  Due to Covid policy and not allowing an office. Follow Up Instructions: Follow-up in 1 to 2 months    I discussed the assessment and treatment plan with the patient. The patient was provided an opportunity to ask questions and all were answered. The patient agreed with the plan and demonstrated an understanding of the instructions.   The patient was advised to call back or seek an in-person evaluation if the symptoms worsen or if the condition fails to improve as anticipated.  The above assessment and management plan was discussed with the patient. The patient verbalized understanding of and has agreed to the management plan. Patient is aware to call the clinic if symptoms persist or worsen. Patient is aware when to return to the clinic for a follow-up visit. Patient educated on when it is appropriate to go to the emergency department.    I provided 12 minutes  of non-face-to-face time during this encounter.    Worthy Rancher, MD

## 2018-12-13 DIAGNOSIS — R69 Illness, unspecified: Secondary | ICD-10-CM | POA: Diagnosis not present

## 2018-12-21 ENCOUNTER — Other Ambulatory Visit: Payer: Self-pay | Admitting: Family Medicine

## 2018-12-24 DIAGNOSIS — M130 Polyarthritis, unspecified: Secondary | ICD-10-CM | POA: Diagnosis not present

## 2018-12-24 DIAGNOSIS — G894 Chronic pain syndrome: Secondary | ICD-10-CM | POA: Diagnosis not present

## 2018-12-24 DIAGNOSIS — E662 Morbid (severe) obesity with alveolar hypoventilation: Secondary | ICD-10-CM | POA: Diagnosis not present

## 2018-12-30 ENCOUNTER — Telehealth: Payer: Self-pay | Admitting: Family Medicine

## 2018-12-30 NOTE — Telephone Encounter (Signed)
Patient called and scheduled his follow up visit with Dr Dettinger for 02/04/2019 which was his 1st available appt but says he will run out of his meds before then. Wants to know if Dr Dettinger can calll him in enough medicine to last him until his appt.

## 2018-12-31 ENCOUNTER — Other Ambulatory Visit: Payer: Self-pay | Admitting: Family Medicine

## 2018-12-31 DIAGNOSIS — M1A9XX0 Chronic gout, unspecified, without tophus (tophi): Secondary | ICD-10-CM

## 2018-12-31 NOTE — Telephone Encounter (Signed)
Controlled substance I cannot approve, last office visit 12/12/2018.

## 2018-12-31 NOTE — Telephone Encounter (Signed)
Patient notified and verbalized understanding. Wanted you to know that sometimes he can see something on TV such as someone falling down a well and it causes him anxiety and he needs the xanax. I again told him that he needs to space out the xanax and you would not fill until the follow up appt. FYI

## 2018-12-31 NOTE — Telephone Encounter (Signed)
He states it is his xanax and seroquel

## 2018-12-31 NOTE — Telephone Encounter (Signed)
He is supposed to be tapering off his Xanax and spacing it out more, so we will hold off on the Xanax but have him go ahead and get a refill enough of the Seroquel to last.

## 2019-01-07 DIAGNOSIS — M130 Polyarthritis, unspecified: Secondary | ICD-10-CM | POA: Diagnosis not present

## 2019-01-07 DIAGNOSIS — E662 Morbid (severe) obesity with alveolar hypoventilation: Secondary | ICD-10-CM | POA: Diagnosis not present

## 2019-01-07 DIAGNOSIS — G894 Chronic pain syndrome: Secondary | ICD-10-CM | POA: Diagnosis not present

## 2019-01-09 ENCOUNTER — Other Ambulatory Visit: Payer: Self-pay | Admitting: Family Medicine

## 2019-01-09 DIAGNOSIS — R161 Splenomegaly, not elsewhere classified: Secondary | ICD-10-CM

## 2019-01-09 DIAGNOSIS — J4530 Mild persistent asthma, uncomplicated: Secondary | ICD-10-CM

## 2019-01-09 DIAGNOSIS — E785 Hyperlipidemia, unspecified: Secondary | ICD-10-CM

## 2019-01-09 DIAGNOSIS — R162 Hepatomegaly with splenomegaly, not elsewhere classified: Secondary | ICD-10-CM

## 2019-01-09 DIAGNOSIS — R160 Hepatomegaly, not elsewhere classified: Secondary | ICD-10-CM

## 2019-01-09 DIAGNOSIS — D696 Thrombocytopenia, unspecified: Secondary | ICD-10-CM

## 2019-01-09 DIAGNOSIS — R748 Abnormal levels of other serum enzymes: Secondary | ICD-10-CM

## 2019-01-09 DIAGNOSIS — D72819 Decreased white blood cell count, unspecified: Secondary | ICD-10-CM

## 2019-01-09 MED ORDER — IPRATROPIUM BROMIDE 0.02 % IN SOLN: 500.0000 ug | Freq: Four times a day (QID) | RESPIRATORY_TRACT | 0 refills | Status: DC | PRN

## 2019-01-09 NOTE — Progress Notes (Signed)
Pt called for a refill on albuterol nebulizer solution, refill sent to pharmacy.   Monia Pouch, FNP-C [facility] [address] [place], [place] [address] [phone]

## 2019-01-12 ENCOUNTER — Other Ambulatory Visit: Payer: Self-pay | Admitting: *Deleted

## 2019-01-12 DIAGNOSIS — E785 Hyperlipidemia, unspecified: Secondary | ICD-10-CM

## 2019-01-12 DIAGNOSIS — R69 Illness, unspecified: Secondary | ICD-10-CM | POA: Diagnosis not present

## 2019-01-12 MED ORDER — IPRATROPIUM BROMIDE 0.02 % IN SOLN: 500.0000 ug | Freq: Four times a day (QID) | RESPIRATORY_TRACT | 0 refills | Status: DC | PRN

## 2019-01-13 ENCOUNTER — Inpatient Hospital Stay (HOSPITAL_COMMUNITY): Payer: Medicare HMO

## 2019-01-13 ENCOUNTER — Other Ambulatory Visit: Payer: Self-pay | Admitting: Family Medicine

## 2019-01-13 DIAGNOSIS — F339 Major depressive disorder, recurrent, unspecified: Secondary | ICD-10-CM

## 2019-01-14 ENCOUNTER — Other Ambulatory Visit: Payer: Self-pay

## 2019-01-14 ENCOUNTER — Telehealth: Payer: Self-pay | Admitting: *Deleted

## 2019-01-14 ENCOUNTER — Inpatient Hospital Stay (HOSPITAL_COMMUNITY): Payer: Medicare HMO | Attending: Hematology

## 2019-01-14 DIAGNOSIS — D696 Thrombocytopenia, unspecified: Secondary | ICD-10-CM | POA: Insufficient documentation

## 2019-01-14 DIAGNOSIS — R69 Illness, unspecified: Secondary | ICD-10-CM | POA: Diagnosis not present

## 2019-01-14 DIAGNOSIS — D61818 Other pancytopenia: Secondary | ICD-10-CM | POA: Insufficient documentation

## 2019-01-14 DIAGNOSIS — K7030 Alcoholic cirrhosis of liver without ascites: Secondary | ICD-10-CM | POA: Diagnosis not present

## 2019-01-14 DIAGNOSIS — Z79899 Other long term (current) drug therapy: Secondary | ICD-10-CM | POA: Insufficient documentation

## 2019-01-14 DIAGNOSIS — E1165 Type 2 diabetes mellitus with hyperglycemia: Secondary | ICD-10-CM

## 2019-01-14 LAB — CBC WITH DIFFERENTIAL/PLATELET
Abs Immature Granulocytes: 0.01 10*3/uL (ref 0.00–0.07)
Basophils Absolute: 0 10*3/uL (ref 0.0–0.1)
Basophils Relative: 2 %
Eosinophils Absolute: 0.1 10*3/uL (ref 0.0–0.5)
Eosinophils Relative: 11 %
HCT: 34.4 % — ABNORMAL LOW (ref 39.0–52.0)
Hemoglobin: 10.9 g/dL — ABNORMAL LOW (ref 13.0–17.0)
Immature Granulocytes: 1 %
Lymphocytes Relative: 36 %
Lymphs Abs: 0.5 10*3/uL — ABNORMAL LOW (ref 0.7–4.0)
MCH: 26.7 pg (ref 26.0–34.0)
MCHC: 31.7 g/dL (ref 30.0–36.0)
MCV: 84.3 fL (ref 80.0–100.0)
Monocytes Absolute: 0.2 10*3/uL (ref 0.1–1.0)
Monocytes Relative: 12 %
Neutro Abs: 0.5 10*3/uL — ABNORMAL LOW (ref 1.7–7.7)
Neutrophils Relative %: 38 %
Platelets: 47 10*3/uL — ABNORMAL LOW (ref 150–400)
RBC: 4.08 MIL/uL — ABNORMAL LOW (ref 4.22–5.81)
RDW: 14.7 % (ref 11.5–15.5)
WBC: 1.3 10*3/uL — CL (ref 4.0–10.5)
nRBC: 0 % (ref 0.0–0.2)

## 2019-01-14 LAB — COMPREHENSIVE METABOLIC PANEL
ALT: 24 U/L (ref 0–44)
AST: 64 U/L — ABNORMAL HIGH (ref 15–41)
Albumin: 3.2 g/dL — ABNORMAL LOW (ref 3.5–5.0)
Alkaline Phosphatase: 96 U/L (ref 38–126)
Anion gap: 10 (ref 5–15)
BUN: 10 mg/dL (ref 6–20)
CO2: 24 mmol/L (ref 22–32)
Calcium: 8.5 mg/dL — ABNORMAL LOW (ref 8.9–10.3)
Chloride: 93 mmol/L — ABNORMAL LOW (ref 98–111)
Creatinine, Ser: 0.84 mg/dL (ref 0.61–1.24)
GFR calc Af Amer: >60 mL/min (ref >60)
GFR calc non Af Amer: >60 mL/min (ref >60)
Glucose, Bld: 115 mg/dL — ABNORMAL HIGH (ref 70–99)
Potassium: 3.6 mmol/L (ref 3.5–5.1)
Sodium: 127 mmol/L — ABNORMAL LOW (ref 135–145)
Total Bilirubin: 1.3 mg/dL — ABNORMAL HIGH (ref 0.3–1.2)
Total Protein: 6.9 g/dL (ref 6.5–8.1)

## 2019-01-14 MED ORDER — GABAPENTIN 800 MG PO TABS: 800.0000 mg | ORAL_TABLET | Freq: Three times a day (TID) | ORAL | 3 refills | Status: DC

## 2019-01-14 NOTE — Telephone Encounter (Signed)
Gababpentin 860m-Insurance rejected--EXCEEDS  MAXIMUM DAILY DOSE OF 3

## 2019-01-14 NOTE — Progress Notes (Unsigned)
CRITICAL VALUE ALERT Critical value received:  WBC 1.3 Date of notification:  01/14/2019 Critical value read back:  Yes.   Nurse who received alert:  Isidoro Donning RN MD notified time and response: Dr. Raliegh Ip notified of WBC count

## 2019-01-14 NOTE — Telephone Encounter (Signed)
Please let the patient know that I sent in gabapentin 803 times a day because his insurance will not pay for more than 3 times a day because of the max.  They will not pay for 4 times a day any further, we just got this message today Caryl Pina, MD [facility] 01/14/2019, 8:24 PM

## 2019-01-15 ENCOUNTER — Telehealth: Payer: Self-pay | Admitting: Family Medicine

## 2019-01-15 LAB — AFP TUMOR MARKER: AFP, Serum, Tumor Marker: 6.8 ng/mL (ref 0.0–8.3)

## 2019-01-15 NOTE — Telephone Encounter (Signed)
Taken care of in original phone call

## 2019-01-15 NOTE — Telephone Encounter (Signed)
Patient aware and verbalized understanding. °

## 2019-01-20 ENCOUNTER — Inpatient Hospital Stay (HOSPITAL_BASED_OUTPATIENT_CLINIC_OR_DEPARTMENT_OTHER): Payer: Medicare HMO | Admitting: Hematology

## 2019-01-20 ENCOUNTER — Other Ambulatory Visit (HOSPITAL_COMMUNITY): Payer: Medicare HMO

## 2019-01-20 ENCOUNTER — Other Ambulatory Visit: Payer: Self-pay

## 2019-01-20 DIAGNOSIS — K7030 Alcoholic cirrhosis of liver without ascites: Secondary | ICD-10-CM

## 2019-01-20 DIAGNOSIS — R69 Illness, unspecified: Secondary | ICD-10-CM | POA: Diagnosis not present

## 2019-01-20 NOTE — Assessment & Plan Note (Signed)
1. Pancytopenia  - Secondary to cirrhosis due to EtOH and possible NAFLD and splenomegaly.  - Status post bone marrow aspiration and biopsy on 10/24/2015 that demonstrated normal cellular bone marrow with trilineage hematopoiesis and normal cytogenetics. - Abdominal  ultrasound performed on April 2018 was consistent with cirrhosis of the liver.  Of note, there was a thrombus noted in the main portal vein that appeared to be nonocclusive.  Spleen was measured at 17.2 cm. -Most recent labs from 01/14/2019 reveal a white blood cell count of 1.3, neutrophil count of 500, hemoglobin 10.9, and platelet count of 47,000.  This is the first time the patient's neutrophil count has been below 1000 over the last several months.  We will continue to monitor.  If neutrophil count remains below thousand we recommend growth factor stimulation. -We will continue to monitor labs closely. -Return to clinic in 1 mth with repeat labs.  2.  Transaminitis secondary to cirrhosis of the liver - Stable. - Recommend patient follow up with GI.  3. Hiatal Hernia - Pt states he experiences abdominal pain when he coughs or bears down. I will make a referral to general surgery for recommendations. High Risk due to portal vein thrombosis.

## 2019-01-20 NOTE — Progress Notes (Signed)
I connected with [name] on 01/20/19 at 11:10 AM EST by phone visit and verified that I am speaking with the correct person using two identifiers.   I discussed the limitations, risks, security and privacy concerns of performing an evaluation and management service by telemedicine and the availability of in-person appointments. I also discussed with the patient that there may be a patient responsible charge related to this service. The patient expressed understanding and agreed to proceed.     Chief Complaint:  - Pancytopenia   HPI:  -Connected with [name] via telephone.  He reports overall doing well.  He denies any recent infections.  He denies any significant fatigue.  He denies any fevers, chills, night sweats.  No evidence of bleeding.  Appetite is stable.  No change in bowel habits.  No weight loss.  He had recent labs which we will discuss today.   Assessment and plan 1. Pancytopenia  - Secondary to cirrhosis due to EtOH and possible NAFLD and splenomegaly.  - Status post bone marrow aspiration and biopsy on 10/24/2015 that demonstrated normal cellular bone marrow with trilineage hematopoiesis and normal cytogenetics. - Abdominal  ultrasound performed on April 2018 was consistent with cirrhosis of the liver.  Of note, there was a thrombus noted in the main portal vein that appeared to be nonocclusive.  Spleen was measured at 17.2 cm. -Most recent labs from 01/14/2019 reveal a white blood cell count of 1.3, neutrophil count of 500, hemoglobin 10.9, and platelet count of 47,000.  This is the first time the patient's neutrophil count has been below 1000 over the last several months.  We will continue to monitor.  If neutrophil count remains below thousand we recommend growth factor stimulation. -We will continue to monitor labs closely. -Return to clinic in 1 mth with repeat labs.  2.  Transaminitis secondary to cirrhosis of the liver - Stable. - Recommend patient follow up  with GI.  3. Hiatal Hernia - Pt states he experiences abdominal pain when he coughs or bears down. I will make a referral to general surgery for recommendations. High Risk due to portal vein thrombosis.

## 2019-01-21 ENCOUNTER — Telehealth: Payer: Self-pay | Admitting: Family Medicine

## 2019-01-21 MED ORDER — BETAMETHASONE DIPROPIONATE 0.05 % EX CREA: TOPICAL_CREAM | CUTANEOUS | 3 refills | Status: DC

## 2019-01-21 NOTE — Telephone Encounter (Signed)
What is the name of the medication? betamethasone dipropionate (DIPROLENE) 0.05 % cream    Have you contacted your pharmacy to request a refill? yes  Which pharmacy would you like this sent to? [facility], Pt needs it for his itching skin    Patient notified that their request is being sent to the clinical staff for review and that they should receive a call once it is complete. If they do not receive a call within 24 hours they can check with their pharmacy or our office.

## 2019-01-27 ENCOUNTER — Ambulatory Visit (HOSPITAL_COMMUNITY): Payer: Medicare HMO | Admitting: Hematology

## 2019-01-27 DIAGNOSIS — M130 Polyarthritis, unspecified: Secondary | ICD-10-CM | POA: Diagnosis not present

## 2019-01-27 DIAGNOSIS — E662 Morbid (severe) obesity with alveolar hypoventilation: Secondary | ICD-10-CM | POA: Diagnosis not present

## 2019-01-27 DIAGNOSIS — G894 Chronic pain syndrome: Secondary | ICD-10-CM | POA: Diagnosis not present

## 2019-02-02 ENCOUNTER — Telehealth: Payer: Self-pay | Admitting: Family Medicine

## 2019-02-02 ENCOUNTER — Encounter (INDEPENDENT_AMBULATORY_CARE_PROVIDER_SITE_OTHER): Payer: Self-pay

## 2019-02-03 ENCOUNTER — Other Ambulatory Visit: Payer: Self-pay | Admitting: Family Medicine

## 2019-02-03 NOTE — Telephone Encounter (Signed)
Pt has upcoming appt, will have him discuss his OSA & CPAP with Dr. Warrick Parisian so new order can be sent to [facility]

## 2019-02-03 NOTE — Telephone Encounter (Signed)
Will need to be discussed in a visit since we have not had a visit since last year to discuss this, his insurance will want a new visit specifically discussing this.  You could ask Juliann Pulse for sure but I believe since is been almost a year we would have to do this Caryl Pina, MD [facility] 02/03/2019, 9:25 AM

## 2019-02-04 ENCOUNTER — Ambulatory Visit (INDEPENDENT_AMBULATORY_CARE_PROVIDER_SITE_OTHER): Payer: Medicare HMO | Admitting: Family Medicine

## 2019-02-04 ENCOUNTER — Encounter: Payer: Self-pay | Admitting: Family Medicine

## 2019-02-04 DIAGNOSIS — F410 Panic disorder [episodic paroxysmal anxiety] without agoraphobia: Secondary | ICD-10-CM

## 2019-02-04 DIAGNOSIS — G4733 Obstructive sleep apnea (adult) (pediatric): Secondary | ICD-10-CM

## 2019-02-04 DIAGNOSIS — R69 Illness, unspecified: Secondary | ICD-10-CM | POA: Diagnosis not present

## 2019-02-04 MED ORDER — BUSPIRONE HCL 10 MG PO TABS: 10.0000 mg | ORAL_TABLET | Freq: Three times a day (TID) | ORAL | 2 refills | Status: DC

## 2019-02-04 NOTE — Progress Notes (Signed)
Virtual Visit via telephone Note  I connected with [name] on 02/04/19 at 1447 by telephone and verified that I am speaking with the correct person using two identifiers. [name] is currently located at home and no other people are currently with her during visit. The provider, Fransisca Kaufmann Rivers Hamrick, MD is located in their office at time of visit.  Call ended at 1459  I discussed the limitations, risks, security and privacy concerns of performing an evaluation and management service by telephone and the availability of in person appointments. I also discussed with the patient that there may be a patient responsible charge related to this service. The patient expressed understanding and agreed to proceed.   History and Present Illness: Patient has a CPAP machine and we tried for a re-up. cpap all night and uses it all night.  He says it does well for him for the most part but he feels like his machine is just not working as well recently is starting to fall apart and that is why he wants a new order for it.  [facility] and that if he gets his machine from that is what he wants to go with.  He feels like it makes a big difference for him when sleeping and he could not live without his sleep apnea machine.  No diagnosis found.  Outpatient Encounter Medications as of 02/04/2019  Medication Sig  . albuterol (PROAIR HFA) 108 (90 Base) MCG/ACT inhaler Inhale 2 puffs into the lungs every 6 (six) hours as needed for wheezing or shortness of breath. (Patient not taking: Reported on 09/24/2018)  . Alcohol Swabs (B-D SINGLE USE SWABS REGULAR) PADS Test BS daily Dx E11.9  . ALPRAZolam (XANAX) 1 MG tablet Take 1 tablet (1 mg total) by mouth at bedtime as needed for anxiety.  Marland Kitchen aspirin EC 81 MG tablet Take 81 mg by mouth every 6 (six) hours as needed (CHEST PAIN).  Marland Kitchen baclofen (LIORESAL) 10 MG tablet TAKE 1 TABLET BY MOUTH THREE TIMES DAILY  . BELBUCA 150 MCG FILM APPLY  ONE FILM TO INSIDE OF MOUTH TWICE DAILY  . betamethasone dipropionate 0.05 % cream APPLY TO AFFECTED AREA TWICE A DAY  . ciprofloxacin (CIPRO) 500 MG tablet Take 1 tablet (500 mg total) by mouth 2 (two) times daily.  . Colchicine 0.6 MG CAPS TAKE ONE CAPSULE BY MOUTH TWICE DAILY AS NEEDED  . CONSTULOSE 10 GM/15ML solution take TWO tablespoonsful (30 mls) BY MOUTH TWICE DAILY  . diltiazem (CARDIZEM SR) 90 MG 12 hr capsule Take 1 capsule (90 mg total) by mouth daily as needed (palpitations). (Patient not taking: Reported on 09/24/2018)  . diltiazem (CARTIA XT) 300 MG 24 hr capsule TAKE ONE CAPSULE BY MOUTH EVERY DAY  . doxepin (SINEQUAN) 10 MG capsule TAKE ONE CAPSULE BY MOUTH AT BEDTIME  . DULoxetine (CYMBALTA) 60 MG capsule Take 60 mg by mouth as needed.   Marland Kitchen esomeprazole (NEXIUM) 40 MG capsule Take 40 mg by mouth daily at 12 noon.   . ezetimibe (ZETIA) 10 MG tablet TAKE 1 TABLET BY MOUTH EVERY DAY  . fluticasone (FLONASE) 50 MCG/ACT nasal spray USE TWO SPRAYS IN EACH NOSTRIL EVERY DAY AS NEEDED  . folic acid (FOLVITE) 1 MG tablet TAKE ONE TABLET BY MOUTH ONCE DAILY - EMERGENCY REFILL FAXED DR.  Marland Kitchen gabapentin (NEURONTIN) 800 MG tablet Take 1 tablet (800 mg total) by mouth 3 (three) times daily.  Marland Kitchen glucose blood (ONETOUCH VERIO)  test strip Test blood sugar daily Dx E11.9  . glucose blood test strip 1 each by Other route as needed for other. Use as instructed  . hydrocortisone 1 % ointment Apply 1 application topically 3 (three) times daily as needed. (Patient not taking: Reported on 09/24/2018)  . HYDROmorphone (DILAUDID) 4 MG tablet TAKE 1 TABLET BY MOUTH EVERY DAY AS NEEDED FOR PAIN  . ibuprofen (ADVIL,MOTRIN) 400 MG tablet Take 1 tablet (400 mg total) by mouth every 8 (eight) hours as needed. for pain (Patient not taking: Reported on 09/24/2018)  . indomethacin (INDOCIN) 50 MG capsule TAKE ONE CAPSULE BY MOUTH THREE TIMES DAILY WITH A MEAL  . ipratropium (ATROVENT) 0.02 % nebulizer solution Take 2.5  mLs (500 mcg total) by nebulization 4 (four) times daily as needed. For wheezing  . lidocaine (XYLOCAINE) 5 % ointment Apply 1 application topically 4 (four) times daily as needed. To knees and ankle as needed for pain  . meloxicam (MOBIC) 15 MG tablet TAKE 1 TABLET BY MOUTH EVERY DAY  . Menthol-Methyl Salicylate (MUSCLE RUB) 10-15 % CREA Apply 1 application topically 3 (three) times daily as needed for muscle pain. For muscle pain   . metolazone (ZAROXOLYN) 2.5 MG tablet TAKE 1 TABLET BY MOUTH 1 & 1/2 HOURS PRIOR TO TORSEMIDE FOR 3 DAYS AS NEEDED FOR SEVERE SWELLING  . milk thistle 175 MG tablet Take 175 mg by mouth 4 (four) times daily.   . Multiple Vitamins-Minerals (MULTI COMPLETE PO) Take 1 tablet by mouth daily.   . naloxone (NARCAN) nasal spray 4 mg/0.1 mL Use as needed for accidental overdose (Patient not taking: Reported on 09/24/2018)  . neomycin-bacitracin-polymyxin (NEOSPORIN) ointment Apply 1 application topically every 12 (twelve) hours as needed for wound care. Reported on 06/01/2015  . nitroGLYCERIN (NITROSTAT) 0.4 MG SL tablet Place 1 tablet (0.4 mg total) under the tongue every 5 (five) minutes as needed for chest pain (may repeat x3). (Patient not taking: Reported on 09/24/2018)  . pantoprazole (PROTONIX) 40 MG tablet TAKE 1 TABLET BY MOUTH EVERY MORNING  . potassium chloride SA (K-DUR) 20 MEQ tablet TAKE 1 TABLET BY MOUTH TWICE DAILY  . pravastatin (PRAVACHOL) 10 MG tablet Take 1 tablet (10 mg total) by mouth daily.  . propranolol (INDERAL) 20 MG tablet TAKE 1 TABLET BY MOUTH DAILY  . QUEtiapine (SEROQUEL) 100 MG tablet Take 1 tablet (100 mg total) by mouth at bedtime.  . sodium chloride (OCEAN) 0.65 % nasal spray Place into the nose.  . torsemide (DEMADEX) 20 MG tablet TAKE TWO TABLETS BY MOUTH in THE morning AND take ONE tablet BY MOUTH AT LUNCH time - EMERGENCY REFILL FAXED DR.  . traMADol (ULTRAM) 50 MG tablet TAKE 1 OR 2 TABLETS BY MOUTH THREE TIMES DAILY AS NEEDED  . vitamin  B-12 (CYANOCOBALAMIN) 1000 MCG tablet Take by mouth.  . VOLTAREN 1 % GEL Apply FOUR grams topically FOUR times daily as needed for pain   No facility-administered encounter medications on file as of 02/04/2019.    Review of Systems  Constitutional: Negative for chills and fever.  Eyes: Negative for visual disturbance.  Respiratory: Negative for shortness of breath and wheezing.   Cardiovascular: Negative for chest pain and leg swelling.  Musculoskeletal: Negative for back pain and gait problem.  Skin: Negative for rash.  Psychiatric/Behavioral: Negative for dysphoric mood, self-injury, sleep disturbance and suicidal ideas. The patient is nervous/anxious.   All other systems reviewed and are negative.   Observations/Objective: Patient sounds comfortable and in  no acute distress  Assessment and Plan: Problem List Items Addressed This Visit      Respiratory   OSA (obstructive sleep apnea) - Primary   Relevant Orders   For home use only DME Other see comment    Other Visit Diagnoses    Panic attack       Relevant Medications   busPIRone (BUSPAR) 10 MG tablet      Will start buspirone for patient's panic and anxiety, gave new prescription for sleep apnea will mail it to the patient tomorrow Follow up plan: Return in about 3 months (around 05/05/2019), or if symptoms worsen or fail to improve.     I discussed the assessment and treatment plan with the patient. The patient was provided an opportunity to ask questions and all were answered. The patient agreed with the plan and demonstrated an understanding of the instructions.   The patient was advised to call back or seek an in-person evaluation if the symptoms worsen or if the condition fails to improve as anticipated.  The above assessment and management plan was discussed with the patient. The patient verbalized understanding of and has agreed to the management plan. Patient is aware to call the clinic if symptoms persist or  worsen. Patient is aware when to return to the clinic for a follow-up visit. Patient educated on when it is appropriate to go to the emergency department.    I provided 12 minutes of non-face-to-face time during this encounter.    Worthy Rancher, MD

## 2019-02-09 ENCOUNTER — Telehealth (INDEPENDENT_AMBULATORY_CARE_PROVIDER_SITE_OTHER): Payer: Self-pay | Admitting: Gastroenterology

## 2019-02-09 NOTE — Telephone Encounter (Signed)
Please let [name] know that the labs will be ordered after his visit on Thursday. Thank you.

## 2019-02-09 NOTE — Telephone Encounter (Signed)
Please notify patient I will discuss his condition and symptoms with him on Thursday in order lab work to be done after visit

## 2019-02-09 NOTE — Telephone Encounter (Signed)
Thayer Headings , [name] will be seeing you on Thursday. Do you want him to hae any lab work prior to [place] or do assessment then order lab work?

## 2019-02-09 NOTE — Telephone Encounter (Signed)
Patient called has an appointment on Thursday, 2-4  - wants to know if he is to have blood work done before he comes in - please advise - [phone]

## 2019-02-11 ENCOUNTER — Telehealth: Payer: Self-pay | Admitting: Family Medicine

## 2019-02-11 NOTE — Telephone Encounter (Signed)
It was probably 1 of 2 medications, it could have been Livalo which is a statin that does not cause as much myalgias or it could have been Repatha which is an injection that helps with the cholesterol.  Either 1 of those are possibilities if he would like to try them, the challenge with both of these as they are brand-name and very expensive so sometimes we have challenges getting them covered.

## 2019-02-11 NOTE — Telephone Encounter (Signed)
Patient states that he was watching TV and he heard a commercial talking about muscle pain so he started listening to it.  States that the commercial was saying if you were taking a Statin that you need to switch to "this" medication.  Since patient only heard the end of the commercial he is not sure what medication they are recommending you switching to.  States that he thinks his muscle pain started after her started the statin.  Wanting to know if Dr. Warrick Parisian knows the name of the medication?  Please advise

## 2019-02-11 NOTE — Telephone Encounter (Signed)
lmtcb

## 2019-02-12 ENCOUNTER — Other Ambulatory Visit (HOSPITAL_COMMUNITY): Payer: Self-pay | Admitting: Emergency Medicine

## 2019-02-12 ENCOUNTER — Ambulatory Visit (INDEPENDENT_AMBULATORY_CARE_PROVIDER_SITE_OTHER): Payer: Medicare HMO | Admitting: Gastroenterology

## 2019-02-12 DIAGNOSIS — D696 Thrombocytopenia, unspecified: Secondary | ICD-10-CM

## 2019-02-13 ENCOUNTER — Other Ambulatory Visit: Payer: Self-pay | Admitting: *Deleted

## 2019-02-13 ENCOUNTER — Inpatient Hospital Stay (HOSPITAL_COMMUNITY): Payer: Medicare HMO | Attending: Hematology

## 2019-02-13 ENCOUNTER — Telehealth: Payer: Self-pay | Admitting: Family Medicine

## 2019-02-13 ENCOUNTER — Telehealth: Payer: Self-pay | Admitting: *Deleted

## 2019-02-13 ENCOUNTER — Other Ambulatory Visit (HOSPITAL_COMMUNITY): Payer: Medicare HMO

## 2019-02-13 DIAGNOSIS — M1A9XX0 Chronic gout, unspecified, without tophus (tophi): Secondary | ICD-10-CM

## 2019-02-13 MED ORDER — COLCHICINE 0.6 MG PO CAPS: 1.0000 | ORAL_CAPSULE | Freq: Two times a day (BID) | ORAL | 1 refills | Status: DC | PRN

## 2019-02-13 NOTE — Telephone Encounter (Signed)
Ok to change to tablet

## 2019-02-13 NOTE — Telephone Encounter (Signed)
Please advise if script will be changed or message forwarded to pcp,  (off work today).

## 2019-02-13 NOTE — Telephone Encounter (Signed)
Script for medication was changed from capsule to tablet.

## 2019-02-13 NOTE — Telephone Encounter (Signed)
Colchicine is not covered by pt's insurance  Covered formulary alternative is Mitigare

## 2019-02-16 MED ORDER — MITIGARE 0.6 MG PO CAPS: 1.0000 | ORAL_CAPSULE | ORAL | 3 refills | Status: DC | PRN

## 2019-02-16 NOTE — Telephone Encounter (Signed)
I sent brand-name Mitigari i to replace his colchicine per his insurance plan

## 2019-02-17 NOTE — Telephone Encounter (Signed)
Several attempts have been made to contact patient. This encounter will be closed.

## 2019-02-18 ENCOUNTER — Ambulatory Visit (INDEPENDENT_AMBULATORY_CARE_PROVIDER_SITE_OTHER): Payer: Medicare HMO | Admitting: Gastroenterology

## 2019-02-18 ENCOUNTER — Other Ambulatory Visit: Payer: Self-pay

## 2019-02-18 ENCOUNTER — Inpatient Hospital Stay (HOSPITAL_COMMUNITY): Payer: Medicare HMO | Attending: Hematology

## 2019-02-18 DIAGNOSIS — D61818 Other pancytopenia: Secondary | ICD-10-CM | POA: Diagnosis not present

## 2019-02-18 DIAGNOSIS — D696 Thrombocytopenia, unspecified: Secondary | ICD-10-CM

## 2019-02-18 LAB — CBC WITH DIFFERENTIAL/PLATELET
Abs Immature Granulocytes: 0 10*3/uL (ref 0.00–0.07)
Basophils Absolute: 0 10*3/uL (ref 0.0–0.1)
Basophils Relative: 1 %
Eosinophils Absolute: 0.1 10*3/uL (ref 0.0–0.5)
Eosinophils Relative: 9 %
HCT: 35.2 % — ABNORMAL LOW (ref 39.0–52.0)
Hemoglobin: 10.9 g/dL — ABNORMAL LOW (ref 13.0–17.0)
Immature Granulocytes: 0 %
Lymphocytes Relative: 27 %
Lymphs Abs: 0.4 10*3/uL — ABNORMAL LOW (ref 0.7–4.0)
MCH: 26.5 pg (ref 26.0–34.0)
MCHC: 31 g/dL (ref 30.0–36.0)
MCV: 85.4 fL (ref 80.0–100.0)
Monocytes Absolute: 0.2 10*3/uL (ref 0.1–1.0)
Monocytes Relative: 10 %
Neutro Abs: 0.8 10*3/uL — ABNORMAL LOW (ref 1.7–7.7)
Neutrophils Relative %: 53 %
Platelets: 35 10*3/uL — ABNORMAL LOW (ref 150–400)
RBC: 4.12 MIL/uL — ABNORMAL LOW (ref 4.22–5.81)
RDW: 15.6 % — ABNORMAL HIGH (ref 11.5–15.5)
WBC: 1.5 10*3/uL — ABNORMAL LOW (ref 4.0–10.5)
nRBC: 0 % (ref 0.0–0.2)

## 2019-02-18 LAB — COMPREHENSIVE METABOLIC PANEL
ALT: 22 U/L (ref 0–44)
AST: 52 U/L — ABNORMAL HIGH (ref 15–41)
Albumin: 3.5 g/dL (ref 3.5–5.0)
Alkaline Phosphatase: 83 U/L (ref 38–126)
Anion gap: 9 (ref 5–15)
BUN: 11 mg/dL (ref 6–20)
CO2: 28 mmol/L (ref 22–32)
Calcium: 8.8 mg/dL — ABNORMAL LOW (ref 8.9–10.3)
Chloride: 96 mmol/L — ABNORMAL LOW (ref 98–111)
Creatinine, Ser: 0.82 mg/dL (ref 0.61–1.24)
GFR calc Af Amer: >60 mL/min (ref >60)
GFR calc non Af Amer: >60 mL/min (ref >60)
Glucose, Bld: 141 mg/dL — ABNORMAL HIGH (ref 70–99)
Potassium: 3.7 mmol/L (ref 3.5–5.1)
Sodium: 133 mmol/L — ABNORMAL LOW (ref 135–145)
Total Bilirubin: 1.6 mg/dL — ABNORMAL HIGH (ref 0.3–1.2)
Total Protein: 6.8 g/dL (ref 6.5–8.1)

## 2019-02-20 ENCOUNTER — Ambulatory Visit (HOSPITAL_COMMUNITY): Payer: Medicare HMO | Admitting: Nurse Practitioner

## 2019-02-20 ENCOUNTER — Inpatient Hospital Stay (HOSPITAL_BASED_OUTPATIENT_CLINIC_OR_DEPARTMENT_OTHER): Payer: Medicare HMO | Admitting: Nurse Practitioner

## 2019-02-20 DIAGNOSIS — D696 Thrombocytopenia, unspecified: Secondary | ICD-10-CM

## 2019-02-20 DIAGNOSIS — D61818 Other pancytopenia: Secondary | ICD-10-CM

## 2019-02-20 DIAGNOSIS — R69 Illness, unspecified: Secondary | ICD-10-CM | POA: Diagnosis not present

## 2019-02-20 NOTE — Progress Notes (Signed)
[place],  [id number]   CLINIC:  Medical Oncology/Hematology  PCP:  Dettinger, Fransisca Kaufmann, MD [place] [id number] [phone]   REASON FOR VISIT: Follow-up for pancytopenia  CURRENT THERAPY: Observation   INTERVAL HISTORY:  Mr. Bodey 54 y.o. male was called for a telephone visit today for pancytopenia.  Patient reports he is doing well since his last visit.  He denies any abdominal pain.  He denies any new petechiae or rash.  He denies any new bright red bleeding per rectum or melena.  He denies any easy bruising or bleeding. Denies any nausea, vomiting, or diarrhea. Denies any new pains. Had not noticed any recent bleeding such as epistaxis, hematuria or hematochezia. Denies recent chest pain on exertion, shortness of breath on minimal exertion, pre-syncopal episodes, or palpitations. Denies any numbness or tingling in hands or feet. Denies any recent fevers, infections, or recent hospitalizations. Patient reports appetite at 100% and energy level at 75%.  He is eating well maintain his weight this time.     REVIEW OF SYSTEMS:  Review of Systems  Constitutional: Positive for fatigue.  All other systems reviewed and are negative.    PAST MEDICAL/SURGICAL HISTORY:  Past Medical History:  Diagnosis Date  . Anxiety   . Atrial fibrillation ([place])   . CHF (congestive heart failure) ([place])   . Cirrhosis ([place])    NASH  . Constipation   . COPD (chronic obstructive pulmonary disease) ([place])   . Depression, recurrent ([place]) 07/18/2016  . Diabetes mellitus    type 2  . GERD (gastroesophageal reflux disease)   . Gout   . Hypertension associated with diabetes ([place])   . Leukopenia 07/08/2015  . Neuromuscular disorder ([facility])    neuropathy in hands and feet  . Psoriasis   . RA (rheumatoid arthritis) ([place])   . Sleep apnea    cpap used- level 10 and greater  . Thrombocytopenia ([place])    Past Surgical History:  Procedure  Laterality Date  . ANKLE SURGERY     right ankle talor repair  . CHOLECYSTECTOMY  sept 2016  . CHOLECYSTECTOMY    . COLONOSCOPY  05/08/2011   Procedure: COLONOSCOPY;  Surgeon: Rogene Houston, MD;  Location[facility];  Service: Endoscopy;  Laterality: N/A;  730  . ESOPHAGOGASTRODUODENOSCOPY  02/28/2011   Procedure: ESOPHAGOGASTRODUODENOSCOPY (EGD);  Surgeon: Rogene Houston, MD;  Location: [facility];  Service: Endoscopy;  Laterality: N/A;  1200  . ESOPHAGOGASTRODUODENOSCOPY N/A 06/11/2012   Procedure: ESOPHAGOGASTRODUODENOSCOPY (EGD);  Surgeon: Rogene Houston, MD;  Location: [facility];  Service: Endoscopy;  Laterality: N/A;  1200  FYI patient is 400 pounds  . ESOPHAGOGASTRODUODENOSCOPY (EGD) WITH PROPOFOL N/A 04/21/2014   Procedure: ESOPHAGOGASTRODUODENOSCOPY (EGD) WITH PROPOFOL;  Surgeon: Rogene Houston, MD;  Location: [facility];  Service: Endoscopy;  Laterality: N/A;  . HERNIA REPAIR Right    as child; inguinal  . HERNIA REPAIR  sept 2016  . LIVER BIOPSY  2012  . SCIATIC NERVE EXPLORATION    . TONSILLECTOMY       SOCIAL HISTORY:  Social History   Socioeconomic History  . Marital status: Divorced    Spouse name: Not on file  . Number of children: Not on file  . Years of education: Not on file  . Highest education level: Not on file  Occupational History  . Not on file  Tobacco Use  . Smoking status: Never Smoker  .  Smokeless tobacco: Current User    Types: Snuff, Chew  . Tobacco comment: Pt reports that he "dips"  Substance and Sexual Activity  . Alcohol use: No    Alcohol/week: 1.0 standard drinks    Types: 1 Cans of beer per week    Comment: stopped drinking 2-3 yrs ago.   . Drug use: No    Comment: Use to smoke cocaine and marijuana. No IV drug use  . Sexual activity: Yes  Other Topics Concern  . Not on file  Social History Narrative  . Not on file   Social Determinants of Health   Financial Resource Strain:   . Difficulty of Paying Living Expenses:  Not on file  Food Insecurity:   . Worried About Charity fundraiser in the Last Year: Not on file  . Ran Out of Food in the Last Year: Not on file  Transportation Needs:   . Lack of Transportation (Medical): Not on file  . Lack of Transportation (Non-Medical): Not on file  Physical Activity:   . Days of Exercise per Week: Not on file  . Minutes of Exercise per Session: Not on file  Stress:   . Feeling of Stress : Not on file  Social Connections:   . Frequency of Communication with Friends and Family: Not on file  . Frequency of Social Gatherings with Friends and Family: Not on file  . Attends Religious Services: Not on file  . Active Member of Clubs or Organizations: Not on file  . Attends Archivist Meetings: Not on file  . Marital Status: Not on file  Intimate Partner Violence:   . Fear of Current or Ex-Partner: Not on file  . Emotionally Abused: Not on file  . Physically Abused: Not on file  . Sexually Abused: Not on file    FAMILY HISTORY:  Family History  Problem Relation Age of Onset  . Colon cancer Mother   . Cancer Mother        intestine  . Cancer Father        throat  . Cancer Brother        brain  . Cancer Sister   . Heart attack Neg Hx   . Stroke Neg Hx     CURRENT MEDICATIONS:  Outpatient Encounter Medications as of 02/20/2019  Medication Sig  . albuterol (PROAIR HFA) 108 (90 Base) MCG/ACT inhaler Inhale 2 puffs into the lungs every 6 (six) hours as needed for wheezing or shortness of breath. (Patient not taking: Reported on 09/24/2018)  . Alcohol Swabs (B-D SINGLE USE SWABS REGULAR) PADS Test BS daily Dx E11.9  . aspirin EC 81 MG tablet Take 81 mg by mouth every 6 (six) hours as needed (CHEST PAIN).  Marland Kitchen baclofen (LIORESAL) 10 MG tablet TAKE 1 TABLET BY MOUTH THREE TIMES DAILY  . BELBUCA 150 MCG FILM APPLY ONE FILM TO INSIDE OF MOUTH TWICE DAILY  . betamethasone dipropionate 0.05 % cream APPLY TO AFFECTED AREA TWICE A DAY  . busPIRone (BUSPAR) 10  MG tablet Take 1 tablet (10 mg total) by mouth 3 (three) times daily.  . ciprofloxacin (CIPRO) 500 MG tablet Take 1 tablet (500 mg total) by mouth 2 (two) times daily.  . CONSTULOSE 10 GM/15ML solution take TWO tablespoonsful (30 mls) BY MOUTH TWICE DAILY  . diltiazem (CARDIZEM SR) 90 MG 12 hr capsule Take 1 capsule (90 mg total) by mouth daily as needed (palpitations). (Patient not taking: Reported on 09/24/2018)  . diltiazem (CARTIA  XT) 300 MG 24 hr capsule TAKE ONE CAPSULE BY MOUTH EVERY DAY  . doxepin (SINEQUAN) 10 MG capsule TAKE ONE CAPSULE BY MOUTH AT BEDTIME  . DULoxetine (CYMBALTA) 60 MG capsule Take 60 mg by mouth as needed.   Marland Kitchen esomeprazole (NEXIUM) 40 MG capsule Take 40 mg by mouth daily at 12 noon.   . ezetimibe (ZETIA) 10 MG tablet TAKE 1 TABLET BY MOUTH EVERY DAY  . fluticasone (FLONASE) 50 MCG/ACT nasal spray USE TWO SPRAYS IN EACH NOSTRIL EVERY DAY AS NEEDED  . folic acid (FOLVITE) 1 MG tablet TAKE ONE TABLET BY MOUTH ONCE DAILY - EMERGENCY REFILL FAXED DR.  Marland Kitchen gabapentin (NEURONTIN) 800 MG tablet Take 1 tablet (800 mg total) by mouth 3 (three) times daily.  Marland Kitchen glucose blood (ONETOUCH VERIO) test strip Test blood sugar daily Dx E11.9  . glucose blood test strip 1 each by Other route as needed for other. Use as instructed  . hydrocortisone 1 % ointment Apply 1 application topically 3 (three) times daily as needed. (Patient not taking: Reported on 09/24/2018)  . HYDROmorphone (DILAUDID) 4 MG tablet TAKE 1 TABLET BY MOUTH EVERY DAY AS NEEDED FOR PAIN  . ibuprofen (ADVIL,MOTRIN) 400 MG tablet Take 1 tablet (400 mg total) by mouth every 8 (eight) hours as needed. for pain (Patient not taking: Reported on 09/24/2018)  . indomethacin (INDOCIN) 50 MG capsule TAKE ONE CAPSULE BY MOUTH THREE TIMES DAILY WITH A MEAL  . ipratropium (ATROVENT) 0.02 % nebulizer solution Take 2.5 mLs (500 mcg total) by nebulization 4 (four) times daily as needed. For wheezing  . lidocaine (XYLOCAINE) 5 % ointment  Apply 1 application topically 4 (four) times daily as needed. To knees and ankle as needed for pain  . meloxicam (MOBIC) 15 MG tablet TAKE 1 TABLET BY MOUTH EVERY DAY  . Menthol-Methyl Salicylate (MUSCLE RUB) 10-15 % CREA Apply 1 application topically 3 (three) times daily as needed for muscle pain. For muscle pain   . metolazone (ZAROXOLYN) 2.5 MG tablet TAKE 1 TABLET BY MOUTH 1 & 1/2 HOURS PRIOR TO TORSEMIDE FOR 3 DAYS AS NEEDED FOR SEVERE SWELLING  . milk thistle 175 MG tablet Take 175 mg by mouth 4 (four) times daily.   Marland Kitchen MITIGARE 0.6 MG CAPS Take 1 capsule by mouth as needed.  . Multiple Vitamins-Minerals (MULTI COMPLETE PO) Take 1 tablet by mouth daily.   . naloxone (NARCAN) nasal spray 4 mg/0.1 mL Use as needed for accidental overdose (Patient not taking: Reported on 09/24/2018)  . neomycin-bacitracin-polymyxin (NEOSPORIN) ointment Apply 1 application topically every 12 (twelve) hours as needed for wound care. Reported on 06/01/2015  . nitroGLYCERIN (NITROSTAT) 0.4 MG SL tablet Place 1 tablet (0.4 mg total) under the tongue every 5 (five) minutes as needed for chest pain (may repeat x3). (Patient not taking: Reported on 09/24/2018)  . pantoprazole (PROTONIX) 40 MG tablet TAKE 1 TABLET BY MOUTH EVERY MORNING  . potassium chloride SA (K-DUR) 20 MEQ tablet TAKE 1 TABLET BY MOUTH TWICE DAILY  . pravastatin (PRAVACHOL) 10 MG tablet Take 1 tablet (10 mg total) by mouth daily.  . propranolol (INDERAL) 20 MG tablet TAKE 1 TABLET BY MOUTH DAILY  . QUEtiapine (SEROQUEL) 100 MG tablet Take 1 tablet (100 mg total) by mouth at bedtime.  . sodium chloride (OCEAN) 0.65 % nasal spray Place into the nose.  . torsemide (DEMADEX) 20 MG tablet TAKE TWO TABLETS BY MOUTH in THE morning AND take ONE tablet BY MOUTH AT LUNCH time -  EMERGENCY REFILL FAXED DR.  . traMADol (ULTRAM) 50 MG tablet TAKE 1 OR 2 TABLETS BY MOUTH THREE TIMES DAILY AS NEEDED  . vitamin B-12 (CYANOCOBALAMIN) 1000 MCG tablet Take by mouth.  .  VOLTAREN 1 % GEL Apply FOUR grams topically FOUR times daily as needed for pain   No facility-administered encounter medications on file as of 02/20/2019.    ALLERGIES:  Allergies  Allergen Reactions  . Allopurinol Other (See Comments)    Joint pain worse   . Oxycodone Base Itching  . Prozac [Fluoxetine Hcl] Itching     Vital signs: -Deferred due to telephone visit  Physical Exam -Deferred due to telephone visit -Patient was alert and oriented over the phone and in no acute distress.   LABORATORY DATA:  I have reviewed the labs as listed.  CBC    Component Value Date/Time   WBC 1.5 (L) 02/18/2019 1248   RBC 4.12 (L) 02/18/2019 1248   HGB 10.9 (L) 02/18/2019 1248   HGB 13.3 02/17/2018 1055   HCT 35.2 (L) 02/18/2019 1248   HCT 39.7 02/17/2018 1055   PLT 35 (L) 02/18/2019 1248   PLT 91 (LL) 02/17/2018 1055   MCV 85.4 02/18/2019 1248   MCV 89 02/17/2018 1055   MCH 26.5 02/18/2019 1248   MCHC 31.0 02/18/2019 1248   RDW 15.6 (H) 02/18/2019 1248   RDW 13.8 02/17/2018 1055   LYMPHSABS 0.4 (L) 02/18/2019 1248   LYMPHSABS 0.8 02/17/2018 1055   MONOABS 0.2 02/18/2019 1248   EOSABS 0.1 02/18/2019 1248   EOSABS 0.0 02/17/2018 1055   BASOSABS 0.0 02/18/2019 1248   BASOSABS 0.0 02/17/2018 1055   CMP Latest Ref Rng & Units 02/18/2019 01/14/2019 09/16/2018  Glucose 70 - 99 mg/dL 141(H) 115(H) 114(H)  BUN 6 - 20 mg/dL 11 10 8   Creatinine 0.61 - 1.24 mg/dL 0.82 0.84 0.70  Sodium 135 - 145 mmol/L 133(L) 127(L) 135  Potassium 3.5 - 5.1 mmol/L 3.7 3.6 4.0  Chloride 98 - 111 mmol/L 96(L) 93(L) 103  CO2 22 - 32 mmol/L 28 24 20(L)  Calcium 8.9 - 10.3 mg/dL 8.8(L) 8.5(L) 9.2  Total Protein 6.5 - 8.1 g/dL 6.8 6.9 6.9  Total Bilirubin 0.3 - 1.2 mg/dL 1.6(H) 1.3(H) 1.3(H)  Alkaline Phos 38 - 126 U/L 83 96 99  AST 15 - 41 U/L 52(H) 64(H) 76(H)  ALT 0 - 44 U/L 22 24 45(H)    All questions were answered to patient's stated satisfaction. Encouraged patient to call with any new concerns or  questions before his next visit to the cancer center and we can certain see him sooner, if needed.     ASSESSMENT & PLAN:   Thrombocytopenia ([place]) 1.  Pancytopenia: -Secondary to cirrhosis due to EtOH and possible NAFLD and splenomegaly. -Status post bone marrow aspiration and biopsy on 10/24/2015 that demonstrated normocellular bone marrow with trilineage hematopoiesis and normal cytogenetics. -Abdominal ultrasound performed on 04/2016 was consistent with cirrhosis of the liver.  Of note, there was a thrombus noted in the main portal vein that appeared to be nonocclusive.  Spleen was measured at 17.2 cm. -Labs done on 02/18/2019 showed WBC of 1.5, neutrophil count of 800, hemoglobin 10.9, platelets 35. -Patient denies any bleeding episodes.  Patient denies any abdominal pain. -We will continue to monitor him closely. -He will follow-up in 1 month with repeat labs.  2.  Transaminitis secondary to cirrhosis of the liver: -This is stable at this time -He is following up with GI.  3.  Hiatal hernia: -Patient states he is experiencing abdominal pain when he coughs or bears down. -He was referred to general surgery for recommendations. -He is high risk due to portal vein thrombosis.      Orders placed this encounter:  Orders Placed This Encounter  Procedures  . Lactate dehydrogenase  . Methylmalonic acid, serum  . Copper, serum  . Haptoglobin  . VITAMIN D 25 Hydroxy (Vit-D Deficiency, Fractures)  . Vitamin B12  . CBC with Differential/Platelet  . Comprehensive metabolic panel  . Ferritin  . Iron and TIBC    I provided 20 minutes of non face-to-face telephone visit time during this encounter, and > 50% was spent counseling as documented under my assessment & plan.  Francene Finders, FNP-C [place] [phone]

## 2019-02-20 NOTE — Assessment & Plan Note (Signed)
1.  Pancytopenia: -Secondary to cirrhosis due to EtOH and possible NAFLD and splenomegaly. -Status post bone marrow aspiration and biopsy on 10/24/2015 that demonstrated normocellular bone marrow with trilineage hematopoiesis and normal cytogenetics. -Abdominal ultrasound performed on 04/2016 was consistent with cirrhosis of the liver.  Of note, there was a thrombus noted in the main portal vein that appeared to be nonocclusive.  Spleen was measured at 17.2 cm. -Labs done on 02/18/2019 showed WBC of 1.5, neutrophil count of 800, hemoglobin 10.9, platelets 35. -Patient denies any bleeding episodes.  Patient denies any abdominal pain. -We will continue to monitor him closely. -He will follow-up in 1 month with repeat labs.  2.  Transaminitis secondary to cirrhosis of the liver: -This is stable at this time -He is following up with GI.  3.  Hiatal hernia: -Patient states he is experiencing abdominal pain when he coughs or bears down. -He was referred to general surgery for recommendations. -He is high risk due to portal vein thrombosis.

## 2019-02-20 NOTE — Patient Instructions (Signed)
[name] [facility] Discharge Instructions  Follow up in 1 month with labs    Thank you for choosing [place] at [facility] to provide your oncology and hematology care.  To afford each patient quality time with our provider, please arrive at least 15 minutes before your scheduled appointment time.   If you have a lab appointment with the [place] please come in thru the Main Entrance and check in at the main information desk.  You need to re-schedule your appointment should you arrive 10 or more minutes late.  We strive to give you quality time with our providers, and arriving late affects you and other patients whose appointments are after yours.  Also, if you no show three or more times for appointments you may be dismissed from the clinic at the providers discretion.     Again, thank you for choosing [facility].  Our hope is that these requests will decrease the amount of time that you wait before being seen by our physicians.       _____________________________________________________________  Should you have questions after your visit to [facility], please contact our office at [phone] between the hours of 8:00 a.m. and 4:30 p.m.  Voicemails left after 4:00 p.m. will not be returned until the following business day.  For prescription refill requests, have your pharmacy contact our office and allow 72 hours.    Due to Covid, you will need to wear a mask upon entering the hospital. If you do not have a mask, a mask will be given to you at the Main Entrance upon arrival. For doctor visits, patients may have 1 support person with them. For treatment visits, patients can not have anyone with them due to social distancing guidelines and our immunocompromised population.

## 2019-02-23 ENCOUNTER — Other Ambulatory Visit: Payer: Self-pay | Admitting: Family Medicine

## 2019-02-23 DIAGNOSIS — M1A9XX0 Chronic gout, unspecified, without tophus (tophi): Secondary | ICD-10-CM

## 2019-02-23 MED ORDER — INDOMETHACIN 50 MG PO CAPS: ORAL_CAPSULE | ORAL | 0 refills | Status: DC

## 2019-02-23 MED ORDER — TORSEMIDE 20 MG PO TABS: ORAL_TABLET | ORAL | 0 refills | Status: DC

## 2019-02-23 MED ORDER — DILTIAZEM HCL ER 90 MG PO CP12: 90.0000 mg | ORAL_CAPSULE | Freq: Every day | ORAL | 5 refills | Status: DC | PRN

## 2019-02-26 LAB — AFP TUMOR MARKER

## 2019-02-27 ENCOUNTER — Telehealth: Payer: Self-pay | Admitting: *Deleted

## 2019-02-27 ENCOUNTER — Telehealth: Payer: Self-pay | Admitting: Family Medicine

## 2019-02-27 ENCOUNTER — Other Ambulatory Visit: Payer: Self-pay | Admitting: Family Medicine

## 2019-02-27 DIAGNOSIS — M1A9XX0 Chronic gout, unspecified, without tophus (tophi): Secondary | ICD-10-CM

## 2019-02-27 DIAGNOSIS — I152 Hypertension secondary to endocrine disorders: Secondary | ICD-10-CM

## 2019-02-27 DIAGNOSIS — I5032 Chronic diastolic (congestive) heart failure: Secondary | ICD-10-CM

## 2019-02-27 DIAGNOSIS — E1159 Type 2 diabetes mellitus with other circulatory complications: Secondary | ICD-10-CM

## 2019-02-27 DIAGNOSIS — I480 Paroxysmal atrial fibrillation: Secondary | ICD-10-CM

## 2019-02-27 MED ORDER — MITIGARE 0.6 MG PO CAPS: 1.0000 | ORAL_CAPSULE | ORAL | 3 refills | Status: DC | PRN

## 2019-02-27 MED ORDER — DILTIAZEM HCL ER COATED BEADS 300 MG PO CP24: ORAL_CAPSULE | ORAL | 3 refills | Status: DC

## 2019-02-27 MED ORDER — INDOMETHACIN 50 MG PO CAPS: ORAL_CAPSULE | ORAL | 0 refills | Status: DC

## 2019-02-27 NOTE — Telephone Encounter (Signed)
lmtcb

## 2019-02-27 NOTE — Telephone Encounter (Signed)
Prior Auth for Indomethacin 50MG capsules-in process   Key: [id number] - PA Case ID: [id number]   Your information has been submitted to [place] [employer] Part [employer]. [employer] will review the request and will issue a decision, typically within 1-3 days from your submission. You can check the updated outcome later by reopening this request.  If [employer] has not responded in 1-3 days or if you have any questions about your ePA request, please contact [place] [employer] Part D at [phone]. If you think there may be a problem with your PA request, use our live chat feature at the bottom right.

## 2019-02-27 NOTE — Telephone Encounter (Signed)
What is the name of the medication? Diltiazem 300 mg 24 hr cap, Mitigare 0.6 mg Caps and Indomethacin 50 mg Patent is out and has switched pharmacy. [facility] [place] [facility]  Have you contacted your pharmacy to request a refill? Yes   [facility] [place]   Patient notified that their request is being sent to the clinical staff for review and that they should receive a call once it is complete. If they do not receive a call within 24 hours they can check with their pharmacy or our office.

## 2019-03-02 ENCOUNTER — Telehealth: Payer: Self-pay | Admitting: Family Medicine

## 2019-03-02 ENCOUNTER — Emergency Department (HOSPITAL_COMMUNITY)
Admission: EM | Admit: 2019-03-02 | Discharge: 2019-03-02 | Disposition: A | Discharge status: Home / Self Care | Payer: Medicare HMO | Attending: Emergency Medicine | Admitting: Emergency Medicine

## 2019-03-02 ENCOUNTER — Other Ambulatory Visit: Payer: Self-pay

## 2019-03-02 ENCOUNTER — Encounter (HOSPITAL_COMMUNITY): Payer: Self-pay | Admitting: Emergency Medicine

## 2019-03-02 ENCOUNTER — Emergency Department (HOSPITAL_COMMUNITY): Payer: Medicare HMO

## 2019-03-02 DIAGNOSIS — E1159 Type 2 diabetes mellitus with other circulatory complications: Secondary | ICD-10-CM

## 2019-03-02 DIAGNOSIS — Z79899 Other long term (current) drug therapy: Secondary | ICD-10-CM | POA: Insufficient documentation

## 2019-03-02 DIAGNOSIS — I509 Heart failure, unspecified: Secondary | ICD-10-CM | POA: Insufficient documentation

## 2019-03-02 DIAGNOSIS — I110 Hypertensive heart disease with heart failure: Secondary | ICD-10-CM | POA: Insufficient documentation

## 2019-03-02 DIAGNOSIS — I152 Hypertension secondary to endocrine disorders: Secondary | ICD-10-CM

## 2019-03-02 DIAGNOSIS — E119 Type 2 diabetes mellitus without complications: Secondary | ICD-10-CM | POA: Insufficient documentation

## 2019-03-02 DIAGNOSIS — M10032 Idiopathic gout, left wrist: Secondary | ICD-10-CM | POA: Insufficient documentation

## 2019-03-02 DIAGNOSIS — I480 Paroxysmal atrial fibrillation: Secondary | ICD-10-CM

## 2019-03-02 DIAGNOSIS — Z7982 Long term (current) use of aspirin: Secondary | ICD-10-CM | POA: Insufficient documentation

## 2019-03-02 DIAGNOSIS — M25532 Pain in left wrist: Secondary | ICD-10-CM | POA: Diagnosis not present

## 2019-03-02 DIAGNOSIS — M1A9XX0 Chronic gout, unspecified, without tophus (tophi): Secondary | ICD-10-CM

## 2019-03-02 DIAGNOSIS — J449 Chronic obstructive pulmonary disease, unspecified: Secondary | ICD-10-CM | POA: Diagnosis not present

## 2019-03-02 DIAGNOSIS — I5032 Chronic diastolic (congestive) heart failure: Secondary | ICD-10-CM

## 2019-03-02 LAB — URIC ACID: Uric Acid, Serum: 7.7 mg/dL (ref 3.7–8.6)

## 2019-03-02 LAB — CBC WITH DIFFERENTIAL/PLATELET
Abs Immature Granulocytes: 0.01 10*3/uL (ref 0.00–0.07)
Basophils Absolute: 0 10*3/uL (ref 0.0–0.1)
Basophils Relative: 1 %
Eosinophils Absolute: 0.2 10*3/uL (ref 0.0–0.5)
Eosinophils Relative: 6 %
HCT: 40.5 % (ref 39.0–52.0)
Hemoglobin: 12.8 g/dL — ABNORMAL LOW (ref 13.0–17.0)
Immature Granulocytes: 0 %
Lymphocytes Relative: 9 %
Lymphs Abs: 0.3 10*3/uL — ABNORMAL LOW (ref 0.7–4.0)
MCH: 26.8 pg (ref 26.0–34.0)
MCHC: 31.6 g/dL (ref 30.0–36.0)
MCV: 84.7 fL (ref 80.0–100.0)
Monocytes Absolute: 0.4 10*3/uL (ref 0.1–1.0)
Monocytes Relative: 11 %
Neutro Abs: 2.4 10*3/uL (ref 1.7–7.7)
Neutrophils Relative %: 73 %
Platelets: 44 10*3/uL — ABNORMAL LOW (ref 150–400)
RBC: 4.78 MIL/uL (ref 4.22–5.81)
RDW: 15.8 % — ABNORMAL HIGH (ref 11.5–15.5)
WBC: 3.3 10*3/uL — ABNORMAL LOW (ref 4.0–10.5)
nRBC: 0 % (ref 0.0–0.2)

## 2019-03-02 LAB — COMPREHENSIVE METABOLIC PANEL
ALT: 23 U/L (ref 0–44)
AST: 56 U/L — ABNORMAL HIGH (ref 15–41)
Albumin: 3.6 g/dL (ref 3.5–5.0)
Alkaline Phosphatase: 94 U/L (ref 38–126)
Anion gap: 10 (ref 5–15)
BUN: 5 mg/dL — ABNORMAL LOW (ref 6–20)
CO2: 26 mmol/L (ref 22–32)
Calcium: 8.9 mg/dL (ref 8.9–10.3)
Chloride: 96 mmol/L — ABNORMAL LOW (ref 98–111)
Creatinine, Ser: 0.64 mg/dL (ref 0.61–1.24)
GFR calc Af Amer: >60 mL/min (ref >60)
GFR calc non Af Amer: >60 mL/min (ref >60)
Glucose, Bld: 160 mg/dL — ABNORMAL HIGH (ref 70–99)
Potassium: 3.3 mmol/L — ABNORMAL LOW (ref 3.5–5.1)
Sodium: 132 mmol/L — ABNORMAL LOW (ref 135–145)
Total Bilirubin: 2.5 mg/dL — ABNORMAL HIGH (ref 0.3–1.2)
Total Protein: 7.4 g/dL (ref 6.5–8.1)

## 2019-03-02 LAB — SEDIMENTATION RATE: Sed Rate: 22 mm/hr — ABNORMAL HIGH (ref 0–16)

## 2019-03-02 LAB — C-REACTIVE PROTEIN: CRP: 8.5 mg/dL — ABNORMAL HIGH (ref <1.0)

## 2019-03-02 MED ORDER — MITIGARE 0.6 MG PO CAPS: 1.0000 | ORAL_CAPSULE | ORAL | 3 refills | Status: DC | PRN

## 2019-03-02 MED ORDER — ACETAMINOPHEN 325 MG PO TABS
650.0000 mg | ORAL_TABLET | Freq: Once | ORAL | Status: AC
  Administered 2019-03-02: 650 mg via ORAL
  Filled 2019-03-02: qty 2

## 2019-03-02 MED ORDER — DILTIAZEM HCL ER COATED BEADS 300 MG PO CP24: ORAL_CAPSULE | ORAL | 3 refills | Status: DC

## 2019-03-02 MED ORDER — DEXAMETHASONE SODIUM PHOSPHATE 10 MG/ML IJ SOLN
10.0000 mg | Freq: Once | INTRAMUSCULAR | Status: AC
  Administered 2019-03-02: 10 mg via INTRAVENOUS
  Filled 2019-03-02: qty 1

## 2019-03-02 MED ORDER — KETOROLAC TROMETHAMINE 30 MG/ML IJ SOLN
15.0000 mg | Freq: Once | INTRAMUSCULAR | Status: AC
  Administered 2019-03-02: 15 mg via INTRAVENOUS
  Filled 2019-03-02: qty 1

## 2019-03-02 MED ORDER — INDOMETHACIN 50 MG PO CAPS: ORAL_CAPSULE | ORAL | 0 refills | Status: DC

## 2019-03-02 NOTE — ED Triage Notes (Signed)
PT states left wrist pain and redness x2 days. PT states his PCP called in two medications for gout to his pharmacy but the medications will not be available for pickup till tomorrow. PT unaware that he was running a fever.

## 2019-03-02 NOTE — Telephone Encounter (Signed)
INDOMETHACIN Capsule approved:  This approval authorizes your coverage from 01/09/2019 - 01/08/2020.  Pharmacy and patient aware.

## 2019-03-02 NOTE — ED Provider Notes (Signed)
[facility] EMERGENCY DEPARTMENT Provider Note   CSN: [id number] Arrival date & time: 03/02/19  1159     History Chief Complaint  Patient presents with  . Wrist Pain    [name] is a 54 y.o. male with a history as outlined below, most significant for gout,  NASH cirrhosis, CHF, a fib, COPD, DM, HTN, RA and psoriasis presenting with acute left wrist pain consistent with prior episodes of gout. He reports waking 2 days ago with redness, swelling and pain the left wrist with sx consistent with prior gouty flares.  He has called his pcp who called in indocin but he won't be able to pick up until tomorrow.  He is asking for a steroid shot which he states always improves his sx within a few hours.  He denies any injury to the hand or wrist.  Of note, he first presented here with a low grade fever of 100.3.  He was not aware of having a fever.  He denies chills, uri sx, sob, cough, no other complaints.    The history is provided by the patient.       Past Medical History:  Diagnosis Date  . Anxiety   . Atrial fibrillation ([place])   . CHF (congestive heart failure) ([place])   . Cirrhosis ([place])    NASH  . Constipation   . COPD (chronic obstructive pulmonary disease) ([place])   . Depression, recurrent ([place]) 07/18/2016  . Diabetes mellitus    type 2  . GERD (gastroesophageal reflux disease)   . Gout   . Hypertension associated with diabetes ([place])   . Leukopenia 07/08/2015  . Neuromuscular disorder (HCC)    neuropathy in hands and feet  . Psoriasis   . RA (rheumatoid arthritis) ([place])   . Sleep apnea    cpap used- level 10 and greater  . Thrombocytopenia [facility])     Patient Active Problem List   Diagnosis Date Noted  . Bilateral primary osteoarthritis of knee 04/04/2017  . Depression, recurrent ([place]) 07/18/2016  . Hyperlipidemia associated with type 2 diabetes mellitus ([place]) 10/31/2015  . Leukopenia 07/08/2015  . Thrombocytopenia ([place]) 07/08/2015  . Portal vein thrombosis  06/08/2015  . Genu varus   . Hypogonadism male 05/05/2012  . Umbilical hernia, reducible 12/26/2011  . Asthma 08/28/2011  . Cirrhosis ([place]) 08/14/2011  . UNSPECIFIED TACHYCARDIA 02/01/2010  . T2DM (type 2 diabetes mellitus) ([place]) 05/22/2008  . Gout 05/22/2008  . Hypertension associated with diabetes ([place]) 05/22/2008  . Atrial fibrillation ([place]) 05/22/2008  . DIASTOLIC HEART FAILURE, CHRONIC 05/22/2008  . OBESITY, MORBID 02/21/2007  . CAD 02/21/2007  . OSA (obstructive sleep apnea) 02/04/2007    Past Surgical History:  Procedure Laterality Date  . ANKLE SURGERY     right ankle talor repair  . CHOLECYSTECTOMY  sept 2016  . CHOLECYSTECTOMY    . COLONOSCOPY  05/08/2011   Procedure: COLONOSCOPY;  Surgeon: Rogene Houston, MD;  Location: [facility] ENDO SUITE;  Service: Endoscopy;  Laterality: N/A;  730  . ESOPHAGOGASTRODUODENOSCOPY  02/28/2011   Procedure: ESOPHAGOGASTRODUODENOSCOPY (EGD);  Surgeon: Rogene Houston, MD;  Location: [facility] ENDO SUITE;  Service: Endoscopy;  Laterality: N/A;  1200  . ESOPHAGOGASTRODUODENOSCOPY N/A 06/11/2012   Procedure: ESOPHAGOGASTRODUODENOSCOPY (EGD);  Surgeon: Rogene Houston, MD;  Location: [facility] ENDO SUITE;  Service: Endoscopy;  Laterality: N/A;  1200  FYI patient is 400 pounds  . ESOPHAGOGASTRODUODENOSCOPY (EGD) WITH PROPOFOL N/A 04/21/2014   Procedure: ESOPHAGOGASTRODUODENOSCOPY (EGD) WITH PROPOFOL;  Surgeon: Rogene Houston,  MD;  Location: [facility] ORS;  Service: Endoscopy;  Laterality: N/A;  . HERNIA REPAIR Right    as child; inguinal  . HERNIA REPAIR  sept 2016  . LIVER BIOPSY  2012  . SCIATIC NERVE EXPLORATION    . TONSILLECTOMY         Family History  Problem Relation Age of Onset  . Colon cancer Mother   . Cancer Mother        intestine  . Cancer Father        throat  . Cancer Brother        brain  . Cancer Sister   . Heart attack Neg Hx   . Stroke Neg Hx     Social History   Tobacco Use  . Smoking status: Never Smoker  . Smokeless tobacco:  Current User    Types: Snuff, Chew  . Tobacco comment: Pt reports that he "dips"  Substance Use Topics  . Alcohol use: No    Alcohol/week: 1.0 standard drinks    Types: 1 Cans of beer per week    Comment: stopped drinking 2-3 yrs ago.   . Drug use: No    Comment: Use to smoke cocaine and marijuana. No IV drug use    Home Medications Prior to Admission medications   Medication Sig Start Date End Date Taking? Authorizing Provider  albuterol (PROAIR HFA) 108 (90 Base) MCG/ACT inhaler Inhale 2 puffs into the lungs every 6 (six) hours as needed for wheezing or shortness of breath. Patient not taking: Reported on 09/24/2018 07/07/15   Timmothy Euler, MD  Alcohol Swabs (B-D SINGLE USE SWABS REGULAR) PADS Test BS daily Dx E11.9 10/15/18   Dettinger, Fransisca Kaufmann, MD  aspirin EC 81 MG tablet Take 81 mg by mouth every 6 (six) hours as needed (CHEST PAIN).    [provider]  baclofen (LIORESAL) 10 MG tablet TAKE 1 TABLET BY MOUTH THREE TIMES DAILY 12/01/18   Dettinger, Fransisca Kaufmann, MD  BELBUCA 150 MCG FILM APPLY ONE FILM TO INSIDE OF MOUTH TWICE DAILY 09/18/18   [provider]  betamethasone dipropionate 0.05 % cream APPLY TO AFFECTED AREA TWICE A DAY 01/21/19   Dettinger, Fransisca Kaufmann, MD  busPIRone (BUSPAR) 10 MG tablet Take 1 tablet (10 mg total) by mouth 3 (three) times daily. 02/04/19   Dettinger, Fransisca Kaufmann, MD  ciprofloxacin (CIPRO) 500 MG tablet Take 1 tablet (500 mg total) by mouth 2 (two) times daily. 10/30/18   Claretta Fraise, MD  CONSTULOSE 10 GM/15ML solution take TWO tablespoonsful (30 mls) BY MOUTH TWICE DAILY 05/28/18   Dettinger, Fransisca Kaufmann, MD  diltiazem (CARDIZEM SR) 90 MG 12 hr capsule Take 1 capsule (90 mg total) by mouth daily as needed (palpitations). 02/23/19   Dettinger, Fransisca Kaufmann, MD  diltiazem (CARTIA XT) 300 MG 24 hr capsule TAKE ONE CAPSULE BY MOUTH EVERY DAY 03/02/19   Dettinger, Fransisca Kaufmann, MD  doxepin (SINEQUAN) 10 MG capsule TAKE ONE CAPSULE BY MOUTH AT BEDTIME 12/31/18    Dettinger, Fransisca Kaufmann, MD  DULoxetine (CYMBALTA) 60 MG capsule Take 60 mg by mouth as needed.  08/27/18   [provider]  esomeprazole (NEXIUM) 40 MG capsule Take 40 mg by mouth daily at 12 noon.     [provider]  ezetimibe (ZETIA) 10 MG tablet TAKE 1 TABLET BY MOUTH EVERY DAY 02/20/18   Dettinger, Fransisca Kaufmann, MD  fluticasone (FLONASE) 50 MCG/ACT nasal spray USE TWO SPRAYS IN EACH NOSTRIL EVERY DAY AS  NEEDED 12/22/18   Dettinger, Fransisca Kaufmann, MD  folic acid (FOLVITE) 1 MG tablet TAKE ONE TABLET BY MOUTH ONCE DAILY - EMERGENCY REFILL FAXED DR. 01/14/17   Vaughan Basta, Rona Ravens, NP  gabapentin (NEURONTIN) 800 MG tablet Take 1 tablet (800 mg total) by mouth 3 (three) times daily. 01/14/19   Dettinger, Fransisca Kaufmann, MD  glucose blood (ONETOUCH VERIO) test strip Test blood sugar daily Dx E11.9 06/04/18   Dettinger, Fransisca Kaufmann, MD  glucose blood test strip 1 each by Other route as needed for other. Use as instructed 01/25/17   Timmothy Euler, MD  hydrocortisone 1 % ointment Apply 1 application topically 3 (three) times daily as needed. Patient not taking: Reported on 09/24/2018 11/04/13   Lysbeth Penner, FNP  HYDROmorphone (DILAUDID) 4 MG tablet TAKE 1 TABLET BY MOUTH EVERY DAY AS NEEDED FOR PAIN 07/14/18   [provider]  ibuprofen (ADVIL,MOTRIN) 400 MG tablet Take 1 tablet (400 mg total) by mouth every 8 (eight) hours as needed. for pain Patient not taking: Reported on 09/24/2018 02/16/18   Evelina Dun A, FNP  indomethacin (INDOCIN) 50 MG capsule TAKE ONE CAPSULE BY MOUTH THREE TIMES DAILY WITH A MEAL 03/02/19   Dettinger, Fransisca Kaufmann, MD  ipratropium (ATROVENT) 0.02 % nebulizer solution Take 2.5 mLs (500 mcg total) by nebulization 4 (four) times daily as needed. For wheezing 01/12/19   Baruch Gouty, FNP  lidocaine (XYLOCAINE) 5 % ointment Apply 1 application topically 4 (four) times daily as needed. To knees and ankle as needed for pain 05/01/16   Timmothy Euler, MD  meloxicam (MOBIC) 15 MG  tablet TAKE 1 TABLET BY MOUTH EVERY DAY 02/03/19   Dettinger, Fransisca Kaufmann, MD  Menthol-Methyl Salicylate (MUSCLE RUB) 10-15 % CREA Apply 1 application topically 3 (three) times daily as needed for muscle pain. For muscle pain     [provider]  metolazone (ZAROXOLYN) 2.5 MG tablet TAKE 1 TABLET BY MOUTH 1 & 1/2 HOURS PRIOR TO TORSEMIDE FOR 3 DAYS AS NEEDED FOR SEVERE SWELLING 12/01/18   Dettinger, Fransisca Kaufmann, MD  milk thistle 175 MG tablet Take 175 mg by mouth 4 (four) times daily.     [provider]  MITIGARE 0.6 MG CAPS Take 1 capsule by mouth as needed. 03/02/19   Dettinger, Fransisca Kaufmann, MD  Multiple Vitamins-Minerals (MULTI COMPLETE PO) Take 1 tablet by mouth daily.     [provider]  naloxone [facility] nasal spray 4 mg/0.1 mL Use as needed for accidental overdose Patient not taking: Reported on 09/24/2018 07/10/18   Dettinger, Fransisca Kaufmann, MD  neomycin-bacitracin-polymyxin (NEOSPORIN) ointment Apply 1 application topically every 12 (twelve) hours as needed for wound care. Reported on 06/01/2015    [provider]  nitroGLYCERIN (NITROSTAT) 0.4 MG SL tablet Place 1 tablet (0.4 mg total) under the tongue every 5 (five) minutes as needed for chest pain (may repeat x3). Patient not taking: Reported on 09/24/2018 06/08/15   Minus Breeding, MD  pantoprazole (PROTONIX) 40 MG tablet TAKE 1 TABLET BY MOUTH EVERY MORNING 11/17/18   Dettinger, Fransisca Kaufmann, MD  potassium chloride SA (K-DUR) 20 MEQ tablet TAKE 1 TABLET BY MOUTH TWICE DAILY 09/18/18   Dettinger, Fransisca Kaufmann, MD  pravastatin (PRAVACHOL) 10 MG tablet Take 1 tablet (10 mg total) by mouth daily. 05/26/18   Dettinger, Fransisca Kaufmann, MD  propranolol (INDERAL) 20 MG tablet TAKE 1 TABLET BY MOUTH DAILY 02/03/19   Dettinger, Fransisca Kaufmann, MD  QUEtiapine (SEROQUEL) 100 MG  tablet Take 1 tablet (100 mg total) by mouth at bedtime. 12/12/18   Dettinger, Fransisca Kaufmann, MD  sodium chloride (OCEAN) 0.65 % nasal spray Place into the nose. 01/13/16   [provider]  torsemide (DEMADEX) 20 MG tablet TAKE TWO TABLETS BY MOUTH in THE morning AND take ONE tablet BY MOUTH AT LUNCH time - EMERGENCY REFILL FAXED DR. 02/23/19   Dettinger, Fransisca Kaufmann, MD  traMADol (ULTRAM) 50 MG tablet TAKE 1 OR 2 TABLETS BY MOUTH THREE TIMES DAILY AS NEEDED 07/14/18   [provider]  vitamin B-12 (CYANOCOBALAMIN) 1000 MCG tablet Take by mouth. 01/13/16   [provider]  VOLTAREN 1 % GEL Apply FOUR grams topically FOUR times daily as needed for pain 02/16/18   Evelina Dun A, FNP    Allergies    Allopurinol, Oxycodone base, and Prozac [fluoxetine hcl]  Review of Systems   Review of Systems  Constitutional: Negative for fever.  Musculoskeletal: Positive for arthralgias and joint swelling. Negative for myalgias.  Skin: Positive for color change.  Neurological: Negative for weakness and numbness.    Physical Exam Updated Vital Signs BP 123/77 (BP Location: Right Arm)   Pulse (!) 102   Temp 98.9 F (37.2 C) (Oral)   Resp 17   Ht 6' (1.829 m)   Wt (!) 172.4 kg   SpO2 94%   BMI 51.54 kg/m   Physical Exam Constitutional:      Appearance: He is well-developed.  HENT:     Head: Atraumatic.  Cardiovascular:     Comments: Pulses equal bilaterally Musculoskeletal:        General: Tenderness present.     Right wrist: No swelling or tenderness. Normal pulse.     Left wrist: Swelling and tenderness present. No crepitus. Normal pulse.     Cervical back: Normal range of motion.     Comments: Mild erythema and edema left dorsal wrist extending toward the 1st mcp joint.  No induration or skin lesions.  Tender to touch.  Scattered plaques on bilateral forearms consistent with psoriasis.  No skin changes suggesting skin infection/cellulitis.   Lymphadenopathy:     Upper Body:     Left upper body: No pectoral or epitrochlear adenopathy.  Skin:    General: Skin is warm and dry.  Neurological:     Mental Status: He is alert.     Sensory: No sensory  deficit.     Deep Tendon Reflexes: Reflexes normal.     ED Results / Procedures / Treatments   Labs (all labs ordered are listed, but only abnormal results are displayed) Labs Reviewed  CBC WITH DIFFERENTIAL/PLATELET - Abnormal; Notable for the following components:      Result Value   WBC 3.3 (*)    Hemoglobin 12.8 (*)    RDW 15.8 (*)    Platelets 44 (*)    Lymphs Abs 0.3 (*)    All other components within normal limits  COMPREHENSIVE METABOLIC PANEL - Abnormal; Notable for the following components:   Sodium 132 (*)    Potassium 3.3 (*)    Chloride 96 (*)    Glucose, Bld 160 (*)    BUN 5 (*)    AST 56 (*)    Total Bilirubin 2.5 (*)    All other components within normal limits  SEDIMENTATION RATE - Abnormal; Notable for the following components:   Sed Rate 22 (*)    All other components within normal limits  C-REACTIVE PROTEIN - Abnormal; Notable  for the following components:   CRP 8.5 (*)    All other components within normal limits  URIC ACID    EKG None  Radiology DG Wrist Complete Left  Result Date: 03/02/2019 CLINICAL DATA:  Left wrist pain and redness. Fever. EXAM: LEFT WRIST - COMPLETE 3+ VIEW COMPARISON:  None. FINDINGS: There is cystic changes in the lunate, capitate, scaphoid, and trapezium and trapezoid. Slight arthritic changes of the radial side of the lunate. Small erosion of the ulnar styloid. Small erosions at the fifth MCP joint. No fracture. On the lateral view there is what may be an old avulsion of the triquetrum but is more likely erosion of the dorsal aspect of the lunate. IMPRESSION: Findings consistent with gout.  No acute abnormality. Electronically Signed   By: Lorriane Shire M.D.   On: 03/02/2019 14:02    Procedures Procedures (including critical care time)  Medications Ordered in ED Medications  ketorolac (TORADOL) 30 MG/ML injection 15 mg (15 mg Intravenous Given 03/02/19 1541)  dexamethasone (DECADRON) injection 10 mg (10 mg Intravenous  Given 03/02/19 1542)  acetaminophen (TYLENOL) tablet 650 mg (650 mg Oral Given 03/02/19 1624)    ED Course  I have reviewed the triage vital signs and the nursing notes.  Pertinent labs & imaging results that were available during my care of the patient were reviewed by me and considered in my medical decision making (see chart for details).    MDM Rules/Calculators/A&P                      Exam and history suggesting acute gout flare.  No leukocytosis and sed rate lower than would be expected with acute septic joint.  Pt was given decadron and toradol injection for immediate assistance with this gout flare.  He was prescribed indocin and pain medicine from his pcp, encouraged to get these meds filled.  Also discussed, elevation and warm compresses.  Return precautions discussed.  Final Clinical Impression(s) / ED Diagnoses Final diagnoses:  Acute idiopathic gout of left wrist    Rx / DC Orders ED Discharge Orders    None       Landis Martins 03/04/19 0741    Milton Ferguson, MD 03/05/19 [phone]

## 2019-03-02 NOTE — Discharge Instructions (Addendum)
Your labs are reassuring today.  It appears your indomethacin has been prescribed by your primary MD and went to your pharmacy at [place] - get this filled and start taking.  You were given a dose of decadron today which should also help with the pain.  Use elevation and warm compresses or a gentle heating pad for additional resolution of your symptoms. Plan to see Dr. Warrick Parisian for a recheck if not starting to improve over the next 48 hours.

## 2019-03-02 NOTE — Telephone Encounter (Signed)
Pt aware refill sent on 02/27/19, resending these medications, he does not use [facility] anymore Sent to [place]

## 2019-03-02 NOTE — Telephone Encounter (Signed)
Spoke with patient.  He is at [facility] emergency room right now hoping to get a shot for his gout.  He does not need to make appointment at this time per patient.

## 2019-03-02 NOTE — Telephone Encounter (Signed)
What is the name of the medication? Indomethacin-60 mg  Have you contacted your pharmacy to request a refill? Yes  Which pharmacy would you like this sent to? [facility]   Patient notified that their request is being sent to the clinical staff for review and that they should receive a call once it is complete. If they do not receive a call within 24 hours they can check with their pharmacy or our office.   [name].  He needs a refill on this.  Please call him.

## 2019-03-03 ENCOUNTER — Other Ambulatory Visit (HOSPITAL_COMMUNITY): Payer: Medicare HMO

## 2019-03-04 ENCOUNTER — Telehealth: Payer: Self-pay | Admitting: Family Medicine

## 2019-03-04 ENCOUNTER — Ambulatory Visit (INDEPENDENT_AMBULATORY_CARE_PROVIDER_SITE_OTHER): Payer: Medicare HMO | Admitting: Family Medicine

## 2019-03-04 ENCOUNTER — Other Ambulatory Visit (HOSPITAL_COMMUNITY): Payer: Self-pay | Admitting: Nurse Practitioner

## 2019-03-04 ENCOUNTER — Encounter: Payer: Self-pay | Admitting: Family Medicine

## 2019-03-04 DIAGNOSIS — G4700 Insomnia, unspecified: Secondary | ICD-10-CM | POA: Diagnosis not present

## 2019-03-04 DIAGNOSIS — R69 Illness, unspecified: Secondary | ICD-10-CM | POA: Diagnosis not present

## 2019-03-04 DIAGNOSIS — K219 Gastro-esophageal reflux disease without esophagitis: Secondary | ICD-10-CM

## 2019-03-04 DIAGNOSIS — M1A9XX0 Chronic gout, unspecified, without tophus (tophi): Secondary | ICD-10-CM | POA: Diagnosis not present

## 2019-03-04 DIAGNOSIS — F339 Major depressive disorder, recurrent, unspecified: Secondary | ICD-10-CM

## 2019-03-04 MED ORDER — CELECOXIB 100 MG PO CAPS: 100.0000 mg | ORAL_CAPSULE | Freq: Two times a day (BID) | ORAL | 3 refills | Status: DC

## 2019-03-04 MED ORDER — BLOOD GLUCOSE TEST VI STRP: 1.0000 | ORAL_STRIP | Freq: Two times a day (BID) | 3 refills | Status: DC

## 2019-03-04 MED ORDER — ESOMEPRAZOLE MAGNESIUM 40 MG PO CPDR: 40.0000 mg | DELAYED_RELEASE_CAPSULE | Freq: Every day | ORAL | 3 refills | Status: DC

## 2019-03-04 MED ORDER — QUETIAPINE FUMARATE 100 MG PO TABS: 100.0000 mg | ORAL_TABLET | Freq: Every day | ORAL | 3 refills | Status: DC

## 2019-03-04 NOTE — Progress Notes (Signed)
Virtual Visit via telephone Note  I connected with [name] on 03/04/19 at 1439 by telephone and verified that I am speaking with the correct person using two identifiers. [name] is currently located at home and no other people are currently with her during visit. The provider, Fransisca Kaufmann Tyshawna Alarid, MD is located in their office at time of visit.  Call ended at 1458  I discussed the limitations, risks, security and privacy concerns of performing an evaluation and management service by telephone and the availability of in person appointments. I also discussed with the patient that there may be a patient responsible charge related to this service. The patient expressed understanding and agreed to proceed.   History and Present Illness: Gout Last attack: 3 days ago and is improving with toradol Attacks this year: 4 Medication: colchicine Location of attacks: right foot   Anxiety and depression Patient is coming in for anxiety and depression is currently taking Seroquel during the evenings and buspirone during the day and says that they are working very well for him.  He wants to keep him going.  He denies any suicidal  Patient has chronic arthritis and osteoarthritis and wants something No diagnosis found.  Outpatient Encounter Medications as of 03/04/2019  Medication Sig  . albuterol (PROAIR HFA) 108 (90 Base) MCG/ACT inhaler Inhale 2 puffs into the lungs every 6 (six) hours as needed for wheezing or shortness of breath. (Patient not taking: Reported on 09/24/2018)  . Alcohol Swabs (B-D SINGLE USE SWABS REGULAR) PADS Test BS daily Dx E11.9  . aspirin EC 81 MG tablet Take 81 mg by mouth every 6 (six) hours as needed (CHEST PAIN).  Marland Kitchen baclofen (LIORESAL) 10 MG tablet TAKE 1 TABLET BY MOUTH THREE TIMES DAILY  . BELBUCA 150 MCG FILM APPLY ONE FILM TO INSIDE OF MOUTH TWICE DAILY  . betamethasone dipropionate 0.05 % cream APPLY TO AFFECTED AREA TWICE A DAY  . busPIRone (BUSPAR)  10 MG tablet Take 1 tablet (10 mg total) by mouth 3 (three) times daily.  . ciprofloxacin (CIPRO) 500 MG tablet Take 1 tablet (500 mg total) by mouth 2 (two) times daily.  . CONSTULOSE 10 GM/15ML solution take TWO tablespoonsful (30 mls) BY MOUTH TWICE DAILY  . diltiazem (CARDIZEM SR) 90 MG 12 hr capsule Take 1 capsule (90 mg total) by mouth daily as needed (palpitations).  Marland Kitchen diltiazem (CARTIA XT) 300 MG 24 hr capsule TAKE ONE CAPSULE BY MOUTH EVERY DAY  . doxepin (SINEQUAN) 10 MG capsule TAKE ONE CAPSULE BY MOUTH AT BEDTIME  . DULoxetine (CYMBALTA) 60 MG capsule Take 60 mg by mouth as needed.   Marland Kitchen esomeprazole (NEXIUM) 40 MG capsule Take 40 mg by mouth daily at 12 noon.   . ezetimibe (ZETIA) 10 MG tablet TAKE 1 TABLET BY MOUTH EVERY DAY  . fluticasone (FLONASE) 50 MCG/ACT nasal spray USE TWO SPRAYS IN EACH NOSTRIL EVERY DAY AS NEEDED  . folic acid (FOLVITE) 1 MG tablet TAKE ONE TABLET BY MOUTH ONCE DAILY - EMERGENCY REFILL FAXED DR.  Marland Kitchen gabapentin (NEURONTIN) 800 MG tablet Take 1 tablet (800 mg total) by mouth 3 (three) times daily.  Marland Kitchen glucose blood (ONETOUCH VERIO) test strip Test blood sugar daily Dx E11.9  . glucose blood test strip 1 each by Other route as needed for other. Use as instructed  . hydrocortisone 1 % ointment Apply 1 application topically 3 (three) times daily as needed. (Patient not taking: Reported on 09/24/2018)  . HYDROmorphone (  DILAUDID) 4 MG tablet TAKE 1 TABLET BY MOUTH EVERY DAY AS NEEDED FOR PAIN  . ibuprofen (ADVIL,MOTRIN) 400 MG tablet Take 1 tablet (400 mg total) by mouth every 8 (eight) hours as needed. for pain (Patient not taking: Reported on 09/24/2018)  . indomethacin (INDOCIN) 50 MG capsule TAKE ONE CAPSULE BY MOUTH THREE TIMES DAILY WITH A MEAL  . ipratropium (ATROVENT) 0.02 % nebulizer solution Take 2.5 mLs (500 mcg total) by nebulization 4 (four) times daily as needed. For wheezing  . lidocaine (XYLOCAINE) 5 % ointment Apply 1 application topically 4 (four) times  daily as needed. To knees and ankle as needed for pain  . meloxicam (MOBIC) 15 MG tablet TAKE 1 TABLET BY MOUTH EVERY DAY  . Menthol-Methyl Salicylate (MUSCLE RUB) 10-15 % CREA Apply 1 application topically 3 (three) times daily as needed for muscle pain. For muscle pain   . metolazone (ZAROXOLYN) 2.5 MG tablet TAKE 1 TABLET BY MOUTH 1 & 1/2 HOURS PRIOR TO TORSEMIDE FOR 3 DAYS AS NEEDED FOR SEVERE SWELLING  . milk thistle 175 MG tablet Take 175 mg by mouth 4 (four) times daily.   Marland Kitchen MITIGARE 0.6 MG CAPS Take 1 capsule by mouth as needed.  . Multiple Vitamins-Minerals (MULTI COMPLETE PO) Take 1 tablet by mouth daily.   . naloxone (NARCAN) nasal spray 4 mg/0.1 mL Use as needed for accidental overdose (Patient not taking: Reported on 09/24/2018)  . neomycin-bacitracin-polymyxin (NEOSPORIN) ointment Apply 1 application topically every 12 (twelve) hours as needed for wound care. Reported on 06/01/2015  . nitroGLYCERIN (NITROSTAT) 0.4 MG SL tablet Place 1 tablet (0.4 mg total) under the tongue every 5 (five) minutes as needed for chest pain (may repeat x3). (Patient not taking: Reported on 09/24/2018)  . pantoprazole (PROTONIX) 40 MG tablet TAKE 1 TABLET BY MOUTH EVERY MORNING  . potassium chloride SA (K-DUR) 20 MEQ tablet TAKE 1 TABLET BY MOUTH TWICE DAILY  . pravastatin (PRAVACHOL) 10 MG tablet Take 1 tablet (10 mg total) by mouth daily.  . propranolol (INDERAL) 20 MG tablet TAKE 1 TABLET BY MOUTH DAILY  . QUEtiapine (SEROQUEL) 100 MG tablet Take 1 tablet (100 mg total) by mouth at bedtime.  . sodium chloride (OCEAN) 0.65 % nasal spray Place into the nose.  . torsemide (DEMADEX) 20 MG tablet TAKE TWO TABLETS BY MOUTH in THE morning AND take ONE tablet BY MOUTH AT LUNCH time - EMERGENCY REFILL FAXED DR.  . traMADol (ULTRAM) 50 MG tablet TAKE 1 OR 2 TABLETS BY MOUTH THREE TIMES DAILY AS NEEDED  . vitamin B-12 (CYANOCOBALAMIN) 1000 MCG tablet Take by mouth.  . VOLTAREN 1 % GEL Apply FOUR grams topically FOUR  times daily as needed for pain   No facility-administered encounter medications on file as of 03/04/2019.    Review of Systems  Constitutional: Negative for chills and fever.  Respiratory: Negative for shortness of breath and wheezing.   Cardiovascular: Negative for chest pain and leg swelling.  Musculoskeletal: Positive for arthralgias, gait problem and joint swelling. Negative for back pain.  Skin: Negative for color change and rash.  All other systems reviewed and are negative.   Observations/Objective: Patient sounds comfortable and in no acute distress  Assessment and Plan: Problem List Items Addressed This Visit      Other   Gout   Relevant Medications   celecoxib (CELEBREX) 100 MG capsule   Depression, recurrent (HCC)   Relevant Medications   QUEtiapine (SEROQUEL) 100 MG tablet    Other  Visit Diagnoses    Gastroesophageal reflux disease without esophagitis    -  Primary   Relevant Medications   esomeprazole (NEXIUM) 40 MG capsule   Insomnia, unspecified type       Relevant Medications   celecoxib (CELEBREX) 100 MG capsule      Start Celebrex, recommended not to use the indomethacin well and the Celebrex as a possible interaction.  He still is taking the colchicine. Follow up plan: No follow-ups on file.     I discussed the assessment and treatment plan with the patient. The patient was provided an opportunity to ask questions and all were answered. The patient agreed with the plan and demonstrated an understanding of the instructions.   The patient was advised to call back or seek an in-person evaluation if the symptoms worsen or if the condition fails to improve as anticipated.  The above assessment and management plan was discussed with the patient. The patient verbalized understanding of and has agreed to the management plan. Patient is aware to call the clinic if symptoms persist or worsen. Patient is aware when to return to the clinic for a follow-up visit.  Patient educated on when it is appropriate to go to the emergency department.    I provided 19 minutes of non-face-to-face time during this encounter.    Worthy Rancher, MD

## 2019-03-04 NOTE — Telephone Encounter (Signed)
Patient had televisit with today or you doing new cpap?

## 2019-03-05 MED ORDER — TRUE METRIX BLOOD GLUCOSE TEST VI STRP: ORAL_STRIP | 3 refills | Status: DC

## 2019-03-05 NOTE — Telephone Encounter (Signed)
Can we have Juliann Pulse check on this, it looks like I put in a DME order and talk about it on my January 27 visit so this should already be done.

## 2019-03-05 NOTE — Telephone Encounter (Signed)
Order faxed to [place]

## 2019-03-10 ENCOUNTER — Other Ambulatory Visit: Payer: Self-pay

## 2019-03-10 ENCOUNTER — Telehealth: Payer: Self-pay | Admitting: Family Medicine

## 2019-03-10 ENCOUNTER — Inpatient Hospital Stay (HOSPITAL_COMMUNITY): Payer: Medicare HMO | Attending: Hematology

## 2019-03-10 ENCOUNTER — Other Ambulatory Visit: Payer: Self-pay | Admitting: *Deleted

## 2019-03-10 ENCOUNTER — Other Ambulatory Visit (HOSPITAL_COMMUNITY): Payer: Self-pay | Admitting: Nurse Practitioner

## 2019-03-10 DIAGNOSIS — E611 Iron deficiency: Secondary | ICD-10-CM | POA: Insufficient documentation

## 2019-03-10 DIAGNOSIS — R161 Splenomegaly, not elsewhere classified: Secondary | ICD-10-CM | POA: Insufficient documentation

## 2019-03-10 DIAGNOSIS — D696 Thrombocytopenia, unspecified: Secondary | ICD-10-CM | POA: Diagnosis not present

## 2019-03-10 DIAGNOSIS — R233 Spontaneous ecchymoses: Secondary | ICD-10-CM | POA: Diagnosis not present

## 2019-03-10 DIAGNOSIS — K7460 Unspecified cirrhosis of liver: Secondary | ICD-10-CM | POA: Insufficient documentation

## 2019-03-10 DIAGNOSIS — D61818 Other pancytopenia: Secondary | ICD-10-CM

## 2019-03-10 DIAGNOSIS — D72819 Decreased white blood cell count, unspecified: Secondary | ICD-10-CM | POA: Insufficient documentation

## 2019-03-10 LAB — COMPREHENSIVE METABOLIC PANEL
ALT: 54 U/L — ABNORMAL HIGH (ref 0–44)
AST: 60 U/L — ABNORMAL HIGH (ref 15–41)
Albumin: 3.1 g/dL — ABNORMAL LOW (ref 3.5–5.0)
Alkaline Phosphatase: 87 U/L (ref 38–126)
Anion gap: 10 (ref 5–15)
BUN: 11 mg/dL (ref 6–20)
CO2: 25 mmol/L (ref 22–32)
Calcium: 8.4 mg/dL — ABNORMAL LOW (ref 8.9–10.3)
Chloride: 99 mmol/L (ref 98–111)
Creatinine, Ser: 0.78 mg/dL (ref 0.61–1.24)
GFR calc Af Amer: >60 mL/min (ref >60)
GFR calc non Af Amer: >60 mL/min (ref >60)
Glucose, Bld: 208 mg/dL — ABNORMAL HIGH (ref 70–99)
Potassium: 3.5 mmol/L (ref 3.5–5.1)
Sodium: 134 mmol/L — ABNORMAL LOW (ref 135–145)
Total Bilirubin: 1.4 mg/dL — ABNORMAL HIGH (ref 0.3–1.2)
Total Protein: 6.5 g/dL (ref 6.5–8.1)

## 2019-03-10 LAB — CBC WITH DIFFERENTIAL/PLATELET
Abs Immature Granulocytes: 0 10*3/uL (ref 0.00–0.07)
Basophils Absolute: 0 10*3/uL (ref 0.0–0.1)
Basophils Relative: 0 %
Eosinophils Absolute: 0 10*3/uL (ref 0.0–0.5)
Eosinophils Relative: 3 %
HCT: 33.1 % — ABNORMAL LOW (ref 39.0–52.0)
Hemoglobin: 10.5 g/dL — ABNORMAL LOW (ref 13.0–17.0)
Immature Granulocytes: 0 %
Lymphocytes Relative: 31 %
Lymphs Abs: 0.4 10*3/uL — ABNORMAL LOW (ref 0.7–4.0)
MCH: 27.1 pg (ref 26.0–34.0)
MCHC: 31.7 g/dL (ref 30.0–36.0)
MCV: 85.3 fL (ref 80.0–100.0)
Monocytes Absolute: 0.1 10*3/uL (ref 0.1–1.0)
Monocytes Relative: 12 %
Neutro Abs: 0.6 10*3/uL — ABNORMAL LOW (ref 1.7–7.7)
Neutrophils Relative %: 54 %
Platelets: 46 10*3/uL — ABNORMAL LOW (ref 150–400)
RBC: 3.88 MIL/uL — ABNORMAL LOW (ref 4.22–5.81)
RDW: 16 % — ABNORMAL HIGH (ref 11.5–15.5)
WBC: 1.1 10*3/uL — CL (ref 4.0–10.5)
nRBC: 0 % (ref 0.0–0.2)

## 2019-03-10 MED ORDER — TRUE METRIX BLOOD GLUCOSE TEST VI STRP: ORAL_STRIP | 3 refills | Status: DC

## 2019-03-10 MED ORDER — BLOOD GLUCOSE METER KIT: PACK | 0 refills | Status: DC

## 2019-03-10 NOTE — Progress Notes (Signed)
CRITICAL VALUE ALERT  Critical Value:  WBC 1.1  Date & Time Notied:  03/10/2019 at 0955  Provider Notified: Cristela Felt, NP  Orders Received/Actions taken: n/a

## 2019-03-10 NOTE — Telephone Encounter (Signed)
Reviewed medication list to look for necessary refills. None needed.

## 2019-03-11 ENCOUNTER — Other Ambulatory Visit (HOSPITAL_COMMUNITY): Payer: Self-pay | Admitting: Nurse Practitioner

## 2019-03-11 LAB — AFP TUMOR MARKER: AFP, Serum, Tumor Marker: 6.5 ng/mL (ref 0.0–8.3)

## 2019-03-12 ENCOUNTER — Other Ambulatory Visit: Payer: Self-pay | Admitting: Family Medicine

## 2019-03-12 MED ORDER — ONETOUCH VERIO W/DEVICE KIT: PACK | 0 refills | Status: DC

## 2019-03-12 NOTE — Telephone Encounter (Signed)
  Medication Request  03/12/2019  What is the name of the medication? One Touch Vero meter. Needs today to check check blood sugars. Has called multiple times to get meter to the pharmacy  Have you contacted your pharmacy to request a refill? NO  Which pharmacy would you like this sent to? [facility] [place]   Patient notified that their request is being sent to the clinical staff for review and that they should receive a call once it is complete. If they do not receive a call within 24 hours they can check with their pharmacy or our office.

## 2019-03-12 NOTE — Telephone Encounter (Signed)
Beebe sent to pharmacy

## 2019-03-16 DIAGNOSIS — R69 Illness, unspecified: Secondary | ICD-10-CM | POA: Diagnosis not present

## 2019-03-17 ENCOUNTER — Other Ambulatory Visit: Payer: Self-pay | Admitting: Family Medicine

## 2019-03-17 DIAGNOSIS — M1A9XX0 Chronic gout, unspecified, without tophus (tophi): Secondary | ICD-10-CM

## 2019-03-18 ENCOUNTER — Telehealth: Payer: Self-pay | Admitting: Family Medicine

## 2019-03-18 NOTE — Telephone Encounter (Signed)
  Medication Request  03/18/2019  What is the name of the medication? furosimide  Have you contacted your pharmacy to request a refill? yes  Which pharmacy would you like this sent to? [facility]   Patient notified that their request is being sent to the clinical staff for review and that they should receive a call once it is complete. If they do not receive a call within 24 hours they can check with their pharmacy or our office.

## 2019-03-20 ENCOUNTER — Inpatient Hospital Stay (HOSPITAL_COMMUNITY): Payer: Medicare HMO

## 2019-03-21 DIAGNOSIS — R42 Dizziness and giddiness: Secondary | ICD-10-CM | POA: Insufficient documentation

## 2019-03-21 DIAGNOSIS — R569 Unspecified convulsions: Secondary | ICD-10-CM | POA: Insufficient documentation

## 2019-03-21 DIAGNOSIS — Z79891 Long term (current) use of opiate analgesic: Secondary | ICD-10-CM | POA: Insufficient documentation

## 2019-03-21 DIAGNOSIS — G5712 Meralgia paresthetica, left lower limb: Secondary | ICD-10-CM | POA: Insufficient documentation

## 2019-03-21 DIAGNOSIS — M130 Polyarthritis, unspecified: Secondary | ICD-10-CM | POA: Insufficient documentation

## 2019-03-23 ENCOUNTER — Ambulatory Visit (HOSPITAL_COMMUNITY): Payer: Medicare HMO | Admitting: Hematology

## 2019-03-23 ENCOUNTER — Telehealth: Payer: Self-pay | Admitting: Family Medicine

## 2019-03-23 MED ORDER — TORSEMIDE 20 MG PO TABS: ORAL_TABLET | ORAL | 0 refills | Status: DC

## 2019-03-23 NOTE — Telephone Encounter (Signed)
Meds sent in. Patient aware.

## 2019-03-23 NOTE — Telephone Encounter (Signed)
°  Medication Request  03/23/2019  What is the name of the medication? torsemide (DEMADEX) 20 MG tablet    Have you contacted your pharmacy to request a refill? YES  Which pharmacy would you like this sent to? [facility]   Patient notified that their request is being sent to the clinical staff for review and that they should receive a call once it is complete. If they do not receive a call within 24 hours they can check with their pharmacy or our office.

## 2019-03-24 DIAGNOSIS — M130 Polyarthritis, unspecified: Secondary | ICD-10-CM | POA: Diagnosis not present

## 2019-03-24 DIAGNOSIS — G894 Chronic pain syndrome: Secondary | ICD-10-CM | POA: Diagnosis not present

## 2019-03-24 DIAGNOSIS — G8929 Other chronic pain: Secondary | ICD-10-CM | POA: Diagnosis not present

## 2019-03-24 DIAGNOSIS — M545 Low back pain: Secondary | ICD-10-CM | POA: Diagnosis not present

## 2019-03-24 DIAGNOSIS — Z79891 Long term (current) use of opiate analgesic: Secondary | ICD-10-CM | POA: Diagnosis not present

## 2019-03-25 DIAGNOSIS — E1142 Type 2 diabetes mellitus with diabetic polyneuropathy: Secondary | ICD-10-CM | POA: Diagnosis not present

## 2019-03-25 DIAGNOSIS — I509 Heart failure, unspecified: Secondary | ICD-10-CM | POA: Diagnosis not present

## 2019-03-25 DIAGNOSIS — E1165 Type 2 diabetes mellitus with hyperglycemia: Secondary | ICD-10-CM | POA: Diagnosis not present

## 2019-03-25 DIAGNOSIS — J449 Chronic obstructive pulmonary disease, unspecified: Secondary | ICD-10-CM | POA: Diagnosis not present

## 2019-03-25 DIAGNOSIS — E261 Secondary hyperaldosteronism: Secondary | ICD-10-CM | POA: Diagnosis not present

## 2019-03-25 DIAGNOSIS — I110 Hypertensive heart disease with heart failure: Secondary | ICD-10-CM | POA: Diagnosis not present

## 2019-03-25 DIAGNOSIS — E785 Hyperlipidemia, unspecified: Secondary | ICD-10-CM | POA: Diagnosis not present

## 2019-03-25 DIAGNOSIS — R69 Illness, unspecified: Secondary | ICD-10-CM | POA: Diagnosis not present

## 2019-04-01 ENCOUNTER — Other Ambulatory Visit (HOSPITAL_COMMUNITY): Payer: Self-pay | Admitting: Nurse Practitioner

## 2019-04-01 ENCOUNTER — Other Ambulatory Visit: Payer: Self-pay

## 2019-04-01 ENCOUNTER — Inpatient Hospital Stay (HOSPITAL_COMMUNITY): Payer: Medicare HMO

## 2019-04-01 ENCOUNTER — Telehealth: Payer: Self-pay | Admitting: Family Medicine

## 2019-04-01 DIAGNOSIS — D696 Thrombocytopenia, unspecified: Secondary | ICD-10-CM

## 2019-04-01 DIAGNOSIS — D61818 Other pancytopenia: Secondary | ICD-10-CM | POA: Diagnosis not present

## 2019-04-01 DIAGNOSIS — D72819 Decreased white blood cell count, unspecified: Secondary | ICD-10-CM | POA: Diagnosis not present

## 2019-04-01 DIAGNOSIS — K7460 Unspecified cirrhosis of liver: Secondary | ICD-10-CM | POA: Diagnosis not present

## 2019-04-01 DIAGNOSIS — R161 Splenomegaly, not elsewhere classified: Secondary | ICD-10-CM | POA: Diagnosis not present

## 2019-04-01 DIAGNOSIS — R233 Spontaneous ecchymoses: Secondary | ICD-10-CM | POA: Diagnosis not present

## 2019-04-01 DIAGNOSIS — E611 Iron deficiency: Secondary | ICD-10-CM | POA: Diagnosis not present

## 2019-04-01 LAB — COMPREHENSIVE METABOLIC PANEL
ALT: 21 U/L (ref 0–44)
AST: 61 U/L — ABNORMAL HIGH (ref 15–41)
Albumin: 3.1 g/dL — ABNORMAL LOW (ref 3.5–5.0)
Alkaline Phosphatase: 99 U/L (ref 38–126)
Anion gap: 10 (ref 5–15)
BUN: 10 mg/dL (ref 6–20)
CO2: 28 mmol/L (ref 22–32)
Calcium: 8.8 mg/dL — ABNORMAL LOW (ref 8.9–10.3)
Chloride: 100 mmol/L (ref 98–111)
Creatinine, Ser: 0.73 mg/dL (ref 0.61–1.24)
GFR calc Af Amer: >60 mL/min (ref >60)
GFR calc non Af Amer: >60 mL/min (ref >60)
Glucose, Bld: 106 mg/dL — ABNORMAL HIGH (ref 70–99)
Potassium: 3.3 mmol/L — ABNORMAL LOW (ref 3.5–5.1)
Sodium: 138 mmol/L (ref 135–145)
Total Bilirubin: 1.2 mg/dL (ref 0.3–1.2)
Total Protein: 6.9 g/dL (ref 6.5–8.1)

## 2019-04-01 LAB — LACTATE DEHYDROGENASE: LDH: 131 U/L (ref 98–192)

## 2019-04-01 LAB — CBC WITH DIFFERENTIAL/PLATELET
Abs Immature Granulocytes: 0 10*3/uL (ref 0.00–0.07)
Basophils Absolute: 0 10*3/uL (ref 0.0–0.1)
Basophils Relative: 2 %
Eosinophils Absolute: 0.1 10*3/uL (ref 0.0–0.5)
Eosinophils Relative: 9 %
HCT: 33.9 % — ABNORMAL LOW (ref 39.0–52.0)
Hemoglobin: 10.6 g/dL — ABNORMAL LOW (ref 13.0–17.0)
Immature Granulocytes: 0 %
Lymphocytes Relative: 30 %
Lymphs Abs: 0.4 10*3/uL — ABNORMAL LOW (ref 0.7–4.0)
MCH: 26.8 pg (ref 26.0–34.0)
MCHC: 31.3 g/dL (ref 30.0–36.0)
MCV: 85.6 fL (ref 80.0–100.0)
Monocytes Absolute: 0.2 10*3/uL (ref 0.1–1.0)
Monocytes Relative: 13 %
Neutro Abs: 0.5 10*3/uL — ABNORMAL LOW (ref 1.7–7.7)
Neutrophils Relative %: 46 %
Platelets: 39 10*3/uL — ABNORMAL LOW (ref 150–400)
RBC: 3.96 MIL/uL — ABNORMAL LOW (ref 4.22–5.81)
RDW: 16.6 % — ABNORMAL HIGH (ref 11.5–15.5)
WBC: 1.2 10*3/uL — CL (ref 4.0–10.5)
nRBC: 0 % (ref 0.0–0.2)

## 2019-04-01 LAB — IRON AND TIBC
Iron: 30 ug/dL — ABNORMAL LOW (ref 45–182)
Saturation Ratios: 9 % — ABNORMAL LOW (ref 17.9–39.5)
TIBC: 349 ug/dL (ref 250–450)
UIBC: 319 ug/dL

## 2019-04-01 LAB — VITAMIN D 25 HYDROXY (VIT D DEFICIENCY, FRACTURES): Vit D, 25-Hydroxy: 29.03 ng/mL — ABNORMAL LOW (ref 30–100)

## 2019-04-01 LAB — FERRITIN: Ferritin: 42 ng/mL (ref 24–336)

## 2019-04-01 LAB — VITAMIN B12: Vitamin B-12: 444 pg/mL (ref 180–914)

## 2019-04-01 NOTE — Telephone Encounter (Signed)
The swelling in his testicles and groin all the way down his legs is more likely related to his congestive heart failure being out of control then related to anything that the urology would deal with.  I would not send him to urology for this but if he is really having swelling that significantly in his testicles and groin then he likely needs to go to the emergency department or call up his cardiologist and see if they have a congestive heart failure team but it is more likely related to his heart that anything to do with his actual testicles. Have him call cardiology or go to the emergency department.  He likely needs diuresis

## 2019-04-01 NOTE — Telephone Encounter (Signed)
Patient aware and verbalized understanding. °

## 2019-04-01 NOTE — Telephone Encounter (Signed)
Patient has a lot of swelling in groin, testicles and legs but prefers not to schedule an office visit with [facility].   He would be ok to go to urology,(Patrick McKenzie,Urology ,listed in chart).  He does not know how many pounds he has gained because his scales don't work for his high weight.  Please advise.

## 2019-04-01 NOTE — Telephone Encounter (Signed)
Pt called and requested to speak with nurse regarding his current situation. Says he is swollen from his waist down. Says he does have a history of this happening to him in the past, but wants to speak with nurse about it asap.   (offered pt an appt but he wanted to speak to nurse first)

## 2019-04-02 LAB — HAPTOGLOBIN: Haptoglobin: 29 mg/dL (ref 29–370)

## 2019-04-04 LAB — COPPER, SERUM: Copper: 67 ug/dL — ABNORMAL LOW (ref 69–132)

## 2019-04-08 ENCOUNTER — Inpatient Hospital Stay (HOSPITAL_BASED_OUTPATIENT_CLINIC_OR_DEPARTMENT_OTHER): Payer: Medicare HMO | Admitting: Hematology

## 2019-04-08 ENCOUNTER — Other Ambulatory Visit: Payer: Self-pay

## 2019-04-08 ENCOUNTER — Encounter (HOSPITAL_COMMUNITY): Payer: Self-pay | Admitting: Hematology

## 2019-04-08 DIAGNOSIS — D509 Iron deficiency anemia, unspecified: Secondary | ICD-10-CM | POA: Diagnosis not present

## 2019-04-08 NOTE — Progress Notes (Signed)
Virtual Visit via Telephone Note  I connected with [name] on 04/08/19 at 11:00 AM EDT by telephone and verified that I am speaking with the correct person using two identifiers.   I discussed the limitations, risks, security and privacy concerns of performing an evaluation and management service by telephone and the availability of in person appointments. I also discussed with the patient that there may be a patient responsible charge related to this service. The patient expressed understanding and agreed to proceed.   History of Present Illness: He was evaluated in our clinic for pancytopenia from splenomegaly and cirrhosis.  He also underwent bone marrow aspiration biopsy on 10/24/2015 that demonstrated normocellular bone marrow with trilineage hematopoiesis and normal cytogenetics.  Abdominal ultrasound in April 2018 was consistent with cirrhosis of the liver.   Observations/Objective: He denies any fevers, night sweats or weight loss in the last few months.  He reports that he used to take multivitamin but has not taken it in the last 2 weeks.  Denies any infections.  No bleeding issues reported including nosebleeds, hematuria or bleeding per rectum.  Has some easy bruising occasionally.  Assessment and Plan:  1.  Pancytopenia: -This is from splenomegaly from cirrhosis. -We reviewed his white count of 1.2 with absolute neutrophil count of 500.  He does not report any infections. -Platelet count is around 40,000.  The latest one was 69.  Easy bruising positive.  No bleeding issues. -Hemoglobin is 10.6 with MCV of 85.6.  B12 level was normal. -We will follow him in 3 months for follow-up with labs.  He was told to call sooner should he develop any recurrent infections.  2.  Iron deficiency state: -His ferritin is 42 and percent saturation is 9.  Hemoglobin is 10.6. -I have suggested him to take iron tablet daily.  We will plan to check iron levels at next visit.  We will also  check folic acid levels.   Follow Up Instructions: RTC 3 months with labs.   I discussed the assessment and treatment plan with the patient. The patient was provided an opportunity to ask questions and all were answered. The patient agreed with the plan and demonstrated an understanding of the instructions.   The patient was advised to call back or seek an in-person evaluation if the symptoms worsen or if the condition fails to improve as anticipated.  I provided 11 minutes of non-face-to-face time during this encounter.   Derek Jack, MD

## 2019-04-09 LAB — METHYLMALONIC ACID, SERUM: Methylmalonic Acid, Quantitative: 304 nmol/L (ref 0–378)

## 2019-04-13 DIAGNOSIS — R69 Illness, unspecified: Secondary | ICD-10-CM | POA: Diagnosis not present

## 2019-04-13 DIAGNOSIS — F419 Anxiety disorder, unspecified: Secondary | ICD-10-CM | POA: Diagnosis not present

## 2019-04-13 DIAGNOSIS — E1140 Type 2 diabetes mellitus with diabetic neuropathy, unspecified: Secondary | ICD-10-CM | POA: Diagnosis not present

## 2019-04-13 DIAGNOSIS — R600 Localized edema: Secondary | ICD-10-CM | POA: Diagnosis not present

## 2019-04-13 DIAGNOSIS — K429 Umbilical hernia without obstruction or gangrene: Secondary | ICD-10-CM | POA: Diagnosis not present

## 2019-04-13 DIAGNOSIS — M109 Gout, unspecified: Secondary | ICD-10-CM | POA: Diagnosis not present

## 2019-04-13 DIAGNOSIS — J449 Chronic obstructive pulmonary disease, unspecified: Secondary | ICD-10-CM | POA: Diagnosis not present

## 2019-04-13 DIAGNOSIS — G4733 Obstructive sleep apnea (adult) (pediatric): Secondary | ICD-10-CM | POA: Diagnosis not present

## 2019-04-13 DIAGNOSIS — M1990 Unspecified osteoarthritis, unspecified site: Secondary | ICD-10-CM | POA: Diagnosis not present

## 2019-04-13 DIAGNOSIS — Z79899 Other long term (current) drug therapy: Secondary | ICD-10-CM | POA: Diagnosis not present

## 2019-04-13 DIAGNOSIS — R161 Splenomegaly, not elsewhere classified: Secondary | ICD-10-CM | POA: Diagnosis not present

## 2019-04-13 DIAGNOSIS — N5089 Other specified disorders of the male genital organs: Secondary | ICD-10-CM | POA: Diagnosis not present

## 2019-04-13 DIAGNOSIS — K7460 Unspecified cirrhosis of liver: Secondary | ICD-10-CM | POA: Diagnosis not present

## 2019-04-13 DIAGNOSIS — N433 Hydrocele, unspecified: Secondary | ICD-10-CM | POA: Diagnosis not present

## 2019-04-13 DIAGNOSIS — I5032 Chronic diastolic (congestive) heart failure: Secondary | ICD-10-CM | POA: Diagnosis not present

## 2019-04-14 ENCOUNTER — Telehealth: Payer: Self-pay | Admitting: Family Medicine

## 2019-04-14 NOTE — Telephone Encounter (Signed)
Pt called stating that he went to the hospital yesterday because his private area had swelled up as big as a basketball and also because he was having a panic attack. Says the doctor at the hospital gave him a Rx for Lorazepam 42m which he says has helped him with his anxiety. Wants to know if Dr Dettinger can stop him on the Buspirone because its not working, and start him either on the Lorazepam or get him back on Xanax.

## 2019-04-14 NOTE — Telephone Encounter (Signed)
Patient aware he will need a visit to discuss medication change.  States he will call back to schedule.

## 2019-04-17 ENCOUNTER — Other Ambulatory Visit: Payer: Self-pay | Admitting: Family Medicine

## 2019-04-21 ENCOUNTER — Encounter (HOSPITAL_COMMUNITY): Payer: Self-pay | Admitting: Emergency Medicine

## 2019-04-21 ENCOUNTER — Other Ambulatory Visit: Payer: Self-pay

## 2019-04-21 DIAGNOSIS — G4733 Obstructive sleep apnea (adult) (pediatric): Secondary | ICD-10-CM | POA: Diagnosis present

## 2019-04-21 DIAGNOSIS — D61818 Other pancytopenia: Secondary | ICD-10-CM | POA: Diagnosis present

## 2019-04-21 DIAGNOSIS — F17220 Nicotine dependence, chewing tobacco, uncomplicated: Secondary | ICD-10-CM | POA: Diagnosis present

## 2019-04-21 DIAGNOSIS — I110 Hypertensive heart disease with heart failure: Secondary | ICD-10-CM | POA: Diagnosis not present

## 2019-04-21 DIAGNOSIS — M069 Rheumatoid arthritis, unspecified: Secondary | ICD-10-CM | POA: Diagnosis present

## 2019-04-21 DIAGNOSIS — N433 Hydrocele, unspecified: Secondary | ICD-10-CM | POA: Diagnosis not present

## 2019-04-21 DIAGNOSIS — F411 Generalized anxiety disorder: Secondary | ICD-10-CM | POA: Diagnosis present

## 2019-04-21 DIAGNOSIS — E785 Hyperlipidemia, unspecified: Secondary | ICD-10-CM | POA: Diagnosis present

## 2019-04-21 DIAGNOSIS — Z20822 Contact with and (suspected) exposure to covid-19: Secondary | ICD-10-CM | POA: Diagnosis not present

## 2019-04-21 DIAGNOSIS — E1169 Type 2 diabetes mellitus with other specified complication: Secondary | ICD-10-CM | POA: Diagnosis present

## 2019-04-21 DIAGNOSIS — I5031 Acute diastolic (congestive) heart failure: Secondary | ICD-10-CM | POA: Diagnosis not present

## 2019-04-21 DIAGNOSIS — Z6841 Body Mass Index (BMI) 40.0 and over, adult: Secondary | ICD-10-CM | POA: Diagnosis not present

## 2019-04-21 DIAGNOSIS — M170 Bilateral primary osteoarthritis of knee: Secondary | ICD-10-CM | POA: Diagnosis not present

## 2019-04-21 DIAGNOSIS — K7581 Nonalcoholic steatohepatitis (NASH): Secondary | ICD-10-CM | POA: Diagnosis present

## 2019-04-21 DIAGNOSIS — I5033 Acute on chronic diastolic (congestive) heart failure: Secondary | ICD-10-CM | POA: Diagnosis not present

## 2019-04-21 DIAGNOSIS — Z800 Family history of malignant neoplasm of digestive organs: Secondary | ICD-10-CM

## 2019-04-21 DIAGNOSIS — Z79899 Other long term (current) drug therapy: Secondary | ICD-10-CM | POA: Diagnosis not present

## 2019-04-21 DIAGNOSIS — R601 Generalized edema: Secondary | ICD-10-CM | POA: Diagnosis not present

## 2019-04-21 DIAGNOSIS — I480 Paroxysmal atrial fibrillation: Secondary | ICD-10-CM | POA: Diagnosis not present

## 2019-04-21 DIAGNOSIS — I5032 Chronic diastolic (congestive) heart failure: Secondary | ICD-10-CM | POA: Diagnosis not present

## 2019-04-21 DIAGNOSIS — M109 Gout, unspecified: Secondary | ICD-10-CM | POA: Diagnosis present

## 2019-04-21 DIAGNOSIS — J449 Chronic obstructive pulmonary disease, unspecified: Secondary | ICD-10-CM | POA: Diagnosis present

## 2019-04-21 DIAGNOSIS — N430 Encysted hydrocele: Secondary | ICD-10-CM | POA: Diagnosis not present

## 2019-04-21 DIAGNOSIS — I4891 Unspecified atrial fibrillation: Secondary | ICD-10-CM | POA: Diagnosis present

## 2019-04-21 DIAGNOSIS — N5089 Other specified disorders of the male genital organs: Secondary | ICD-10-CM | POA: Diagnosis not present

## 2019-04-21 DIAGNOSIS — I2510 Atherosclerotic heart disease of native coronary artery without angina pectoris: Secondary | ICD-10-CM | POA: Diagnosis not present

## 2019-04-21 DIAGNOSIS — K429 Umbilical hernia without obstruction or gangrene: Secondary | ICD-10-CM | POA: Diagnosis present

## 2019-04-21 DIAGNOSIS — R69 Illness, unspecified: Secondary | ICD-10-CM | POA: Diagnosis not present

## 2019-04-21 DIAGNOSIS — R7401 Elevation of levels of liver transaminase levels: Secondary | ICD-10-CM | POA: Diagnosis not present

## 2019-04-21 DIAGNOSIS — K7030 Alcoholic cirrhosis of liver without ascites: Secondary | ICD-10-CM | POA: Diagnosis not present

## 2019-04-21 DIAGNOSIS — M1A9XX0 Chronic gout, unspecified, without tophus (tophi): Secondary | ICD-10-CM | POA: Diagnosis not present

## 2019-04-21 NOTE — ED Triage Notes (Addendum)
Patient also states that he is having scrotum pain. Patient has been seen for this problem at [facility].

## 2019-04-21 NOTE — ED Triage Notes (Signed)
Patient is here for gout pain in his left wrist.

## 2019-04-22 ENCOUNTER — Encounter (HOSPITAL_COMMUNITY): Payer: Self-pay | Admitting: Family Medicine

## 2019-04-22 ENCOUNTER — Emergency Department (HOSPITAL_COMMUNITY): Payer: Medicare HMO

## 2019-04-22 ENCOUNTER — Inpatient Hospital Stay (HOSPITAL_COMMUNITY): Payer: Medicare HMO

## 2019-04-22 ENCOUNTER — Inpatient Hospital Stay (HOSPITAL_COMMUNITY)
Admission: EM | Admit: 2019-04-22 | Discharge: 2019-04-25 | DRG: 292 | Disposition: A | Discharge status: Home / Self Care | Payer: Medicare HMO | Attending: Family Medicine | Admitting: Family Medicine

## 2019-04-22 DIAGNOSIS — N430 Encysted hydrocele: Secondary | ICD-10-CM | POA: Diagnosis not present

## 2019-04-22 DIAGNOSIS — I5031 Acute diastolic (congestive) heart failure: Secondary | ICD-10-CM | POA: Diagnosis not present

## 2019-04-22 DIAGNOSIS — N433 Hydrocele, unspecified: Secondary | ICD-10-CM | POA: Diagnosis present

## 2019-04-22 DIAGNOSIS — I4891 Unspecified atrial fibrillation: Secondary | ICD-10-CM | POA: Diagnosis present

## 2019-04-22 DIAGNOSIS — E782 Mixed hyperlipidemia: Secondary | ICD-10-CM | POA: Diagnosis present

## 2019-04-22 DIAGNOSIS — Z79899 Other long term (current) drug therapy: Secondary | ICD-10-CM | POA: Diagnosis not present

## 2019-04-22 DIAGNOSIS — D61818 Other pancytopenia: Secondary | ICD-10-CM | POA: Diagnosis present

## 2019-04-22 DIAGNOSIS — M170 Bilateral primary osteoarthritis of knee: Secondary | ICD-10-CM | POA: Diagnosis present

## 2019-04-22 DIAGNOSIS — Z20822 Contact with and (suspected) exposure to covid-19: Secondary | ICD-10-CM | POA: Diagnosis not present

## 2019-04-22 DIAGNOSIS — I110 Hypertensive heart disease with heart failure: Secondary | ICD-10-CM | POA: Diagnosis not present

## 2019-04-22 DIAGNOSIS — G4733 Obstructive sleep apnea (adult) (pediatric): Secondary | ICD-10-CM | POA: Diagnosis present

## 2019-04-22 DIAGNOSIS — J449 Chronic obstructive pulmonary disease, unspecified: Secondary | ICD-10-CM | POA: Diagnosis present

## 2019-04-22 DIAGNOSIS — E785 Hyperlipidemia, unspecified: Secondary | ICD-10-CM | POA: Diagnosis not present

## 2019-04-22 DIAGNOSIS — K7030 Alcoholic cirrhosis of liver without ascites: Secondary | ICD-10-CM | POA: Diagnosis not present

## 2019-04-22 DIAGNOSIS — I10 Essential (primary) hypertension: Secondary | ICD-10-CM | POA: Diagnosis present

## 2019-04-22 DIAGNOSIS — E1169 Type 2 diabetes mellitus with other specified complication: Secondary | ICD-10-CM | POA: Diagnosis present

## 2019-04-22 DIAGNOSIS — I5033 Acute on chronic diastolic (congestive) heart failure: Secondary | ICD-10-CM

## 2019-04-22 DIAGNOSIS — Z800 Family history of malignant neoplasm of digestive organs: Secondary | ICD-10-CM | POA: Diagnosis not present

## 2019-04-22 DIAGNOSIS — I5032 Chronic diastolic (congestive) heart failure: Secondary | ICD-10-CM | POA: Diagnosis not present

## 2019-04-22 DIAGNOSIS — K429 Umbilical hernia without obstruction or gangrene: Secondary | ICD-10-CM | POA: Diagnosis present

## 2019-04-22 DIAGNOSIS — F411 Generalized anxiety disorder: Secondary | ICD-10-CM | POA: Diagnosis present

## 2019-04-22 DIAGNOSIS — R601 Generalized edema: Secondary | ICD-10-CM | POA: Diagnosis not present

## 2019-04-22 DIAGNOSIS — K7581 Nonalcoholic steatohepatitis (NASH): Secondary | ICD-10-CM | POA: Diagnosis present

## 2019-04-22 DIAGNOSIS — E119 Type 2 diabetes mellitus without complications: Secondary | ICD-10-CM

## 2019-04-22 DIAGNOSIS — R7401 Elevation of levels of liver transaminase levels: Secondary | ICD-10-CM | POA: Diagnosis present

## 2019-04-22 DIAGNOSIS — I2510 Atherosclerotic heart disease of native coronary artery without angina pectoris: Secondary | ICD-10-CM | POA: Diagnosis present

## 2019-04-22 DIAGNOSIS — M069 Rheumatoid arthritis, unspecified: Secondary | ICD-10-CM | POA: Diagnosis present

## 2019-04-22 DIAGNOSIS — Z6841 Body Mass Index (BMI) 40.0 and over, adult: Secondary | ICD-10-CM | POA: Diagnosis not present

## 2019-04-22 DIAGNOSIS — R69 Illness, unspecified: Secondary | ICD-10-CM | POA: Diagnosis not present

## 2019-04-22 DIAGNOSIS — F17220 Nicotine dependence, chewing tobacco, uncomplicated: Secondary | ICD-10-CM | POA: Diagnosis present

## 2019-04-22 DIAGNOSIS — M1A9XX0 Chronic gout, unspecified, without tophus (tophi): Secondary | ICD-10-CM | POA: Diagnosis not present

## 2019-04-22 DIAGNOSIS — I482 Chronic atrial fibrillation, unspecified: Secondary | ICD-10-CM | POA: Diagnosis present

## 2019-04-22 DIAGNOSIS — N5089 Other specified disorders of the male genital organs: Secondary | ICD-10-CM | POA: Diagnosis not present

## 2019-04-22 DIAGNOSIS — M109 Gout, unspecified: Secondary | ICD-10-CM | POA: Diagnosis not present

## 2019-04-22 DIAGNOSIS — E1159 Type 2 diabetes mellitus with other circulatory complications: Secondary | ICD-10-CM | POA: Diagnosis present

## 2019-04-22 DIAGNOSIS — D696 Thrombocytopenia, unspecified: Secondary | ICD-10-CM | POA: Diagnosis present

## 2019-04-22 DIAGNOSIS — K7460 Unspecified cirrhosis of liver: Secondary | ICD-10-CM | POA: Diagnosis present

## 2019-04-22 DIAGNOSIS — I480 Paroxysmal atrial fibrillation: Secondary | ICD-10-CM | POA: Diagnosis not present

## 2019-04-22 DIAGNOSIS — Z8639 Personal history of other endocrine, nutritional and metabolic disease: Secondary | ICD-10-CM

## 2019-04-22 LAB — CBC WITH DIFFERENTIAL/PLATELET
Abs Immature Granulocytes: 0.01 10*3/uL (ref 0.00–0.07)
Basophils Absolute: 0 10*3/uL (ref 0.0–0.1)
Basophils Relative: 1 %
Eosinophils Absolute: 0.2 10*3/uL (ref 0.0–0.5)
Eosinophils Relative: 11 %
HCT: 37.9 % — ABNORMAL LOW (ref 39.0–52.0)
Hemoglobin: 11.8 g/dL — ABNORMAL LOW (ref 13.0–17.0)
Immature Granulocytes: 1 %
Lymphocytes Relative: 18 %
Lymphs Abs: 0.4 10*3/uL — ABNORMAL LOW (ref 0.7–4.0)
MCH: 26.5 pg (ref 26.0–34.0)
MCHC: 31.1 g/dL (ref 30.0–36.0)
MCV: 85 fL (ref 80.0–100.0)
Monocytes Absolute: 0.2 10*3/uL (ref 0.1–1.0)
Monocytes Relative: 9 %
Neutro Abs: 1.3 10*3/uL — ABNORMAL LOW (ref 1.7–7.7)
Neutrophils Relative %: 60 %
Platelets: 47 10*3/uL — ABNORMAL LOW (ref 150–400)
RBC: 4.46 MIL/uL (ref 4.22–5.81)
RDW: 16.8 % — ABNORMAL HIGH (ref 11.5–15.5)
WBC: 2.2 10*3/uL — ABNORMAL LOW (ref 4.0–10.5)
nRBC: 0 % (ref 0.0–0.2)

## 2019-04-22 LAB — COMPREHENSIVE METABOLIC PANEL
ALT: 23 U/L (ref 0–44)
AST: 65 U/L — ABNORMAL HIGH (ref 15–41)
Albumin: 3.5 g/dL (ref 3.5–5.0)
Alkaline Phosphatase: 104 U/L (ref 38–126)
Anion gap: 9 (ref 5–15)
BUN: 7 mg/dL (ref 6–20)
CO2: 24 mmol/L (ref 22–32)
Calcium: 9 mg/dL (ref 8.9–10.3)
Chloride: 103 mmol/L (ref 98–111)
Creatinine, Ser: 0.71 mg/dL (ref 0.61–1.24)
GFR calc Af Amer: >60 mL/min (ref >60)
GFR calc non Af Amer: >60 mL/min (ref >60)
Glucose, Bld: 101 mg/dL — ABNORMAL HIGH (ref 70–99)
Potassium: 3.6 mmol/L (ref 3.5–5.1)
Sodium: 136 mmol/L (ref 135–145)
Total Bilirubin: 1.2 mg/dL (ref 0.3–1.2)
Total Protein: 7.5 g/dL (ref 6.5–8.1)

## 2019-04-22 LAB — HIV ANTIBODY (ROUTINE TESTING W REFLEX): HIV Screen 4th Generation wRfx: NONREACTIVE

## 2019-04-22 LAB — GLUCOSE, CAPILLARY
Glucose-Capillary: 199 mg/dL — ABNORMAL HIGH (ref 70–99)
Glucose-Capillary: 186 mg/dL — ABNORMAL HIGH (ref 70–99)
Glucose-Capillary: 163 mg/dL — ABNORMAL HIGH (ref 70–99)
Glucose-Capillary: 258 mg/dL — ABNORMAL HIGH (ref 70–99)

## 2019-04-22 LAB — BRAIN NATRIURETIC PEPTIDE: B Natriuretic Peptide: 114 pg/mL — ABNORMAL HIGH (ref 0.0–100.0)

## 2019-04-22 LAB — ECHOCARDIOGRAM COMPLETE

## 2019-04-22 LAB — RESPIRATORY PANEL BY RT PCR (FLU A&B, COVID)
Influenza A by PCR: NEGATIVE
Influenza B by PCR: NEGATIVE
SARS Coronavirus 2 by RT PCR: NEGATIVE

## 2019-04-22 MED ORDER — ASPIRIN EC 81 MG PO TBEC
81.0000 mg | DELAYED_RELEASE_TABLET | Freq: Every day | ORAL | Status: DC
  Administered 2019-04-22 – 2019-04-25 (×4): 81 mg via ORAL
  Filled 2019-04-22 (×4): qty 1

## 2019-04-22 MED ORDER — METHYLPREDNISOLONE SODIUM SUCC 125 MG IJ SOLR
60.0000 mg | Freq: Every day | INTRAMUSCULAR | Status: DC
  Administered 2019-04-22 – 2019-04-23 (×2): 60 mg via INTRAVENOUS
  Filled 2019-04-22 (×2): qty 2

## 2019-04-22 MED ORDER — DILTIAZEM HCL ER COATED BEADS 300 MG PO CP24
300.0000 mg | ORAL_CAPSULE | Freq: Every day | ORAL | Status: DC
  Administered 2019-04-22 – 2019-04-25 (×3): 300 mg via ORAL
  Filled 2019-04-22 (×7): qty 1

## 2019-04-22 MED ORDER — PERFLUTREN LIPID MICROSPHERE
1.0000 mL | INTRAVENOUS | Status: AC | PRN
  Administered 2019-04-22: 2 mL via INTRAVENOUS
  Filled 2019-04-22: qty 10

## 2019-04-22 MED ORDER — GABAPENTIN 400 MG PO CAPS
800.0000 mg | ORAL_CAPSULE | Freq: Three times a day (TID) | ORAL | Status: DC
  Administered 2019-04-22 – 2019-04-25 (×10): 800 mg via ORAL
  Filled 2019-04-22 (×10): qty 2

## 2019-04-22 MED ORDER — ALPRAZOLAM 0.25 MG PO TABS
0.2500 mg | ORAL_TABLET | Freq: Two times a day (BID) | ORAL | Status: DC | PRN
  Administered 2019-04-22: 0.25 mg via ORAL
  Filled 2019-04-22: qty 1

## 2019-04-22 MED ORDER — DULOXETINE HCL 30 MG PO CPEP
60.0000 mg | ORAL_CAPSULE | Freq: Every day | ORAL | Status: DC
  Administered 2019-04-22 – 2019-04-24 (×3): 60 mg via ORAL
  Filled 2019-04-22 (×3): qty 2

## 2019-04-22 MED ORDER — LACTULOSE 10 GM/15ML PO SOLN
20.0000 g | Freq: Two times a day (BID) | ORAL | Status: DC
  Administered 2019-04-22 – 2019-04-25 (×7): 20 g via ORAL
  Filled 2019-04-22 (×7): qty 30

## 2019-04-22 MED ORDER — HYDROMORPHONE HCL 2 MG PO TABS
4.0000 mg | ORAL_TABLET | Freq: Every day | ORAL | Status: DC | PRN
  Administered 2019-04-22: 4 mg via ORAL
  Filled 2019-04-22: qty 2

## 2019-04-22 MED ORDER — BACLOFEN 10 MG PO TABS
5.0000 mg | ORAL_TABLET | Freq: Two times a day (BID) | ORAL | Status: DC | PRN
  Administered 2019-04-24: 5 mg via ORAL
  Filled 2019-04-22: qty 1

## 2019-04-22 MED ORDER — FENTANYL CITRATE (PF) 100 MCG/2ML IJ SOLN: 25.0000 ug | INTRAMUSCULAR | Status: DC | PRN

## 2019-04-22 MED ORDER — PROPRANOLOL HCL 20 MG PO TABS
20.0000 mg | ORAL_TABLET | Freq: Every day | ORAL | Status: DC
  Administered 2019-04-23 – 2019-04-25 (×3): 20 mg via ORAL
  Filled 2019-04-22: qty 1
  Filled 2019-04-22: qty 2
  Filled 2019-04-22 (×2): qty 1
  Filled 2019-04-22 (×2): qty 2
  Filled 2019-04-22 (×2): qty 1

## 2019-04-22 MED ORDER — POTASSIUM CHLORIDE CRYS ER 20 MEQ PO TBCR
20.0000 meq | EXTENDED_RELEASE_TABLET | Freq: Two times a day (BID) | ORAL | Status: DC
  Administered 2019-04-22: 20 meq via ORAL
  Filled 2019-04-22: qty 1

## 2019-04-22 MED ORDER — VITAMIN B-12 1000 MCG PO TABS
1000.0000 ug | ORAL_TABLET | Freq: Every day | ORAL | Status: DC
  Administered 2019-04-22 – 2019-04-25 (×4): 1000 ug via ORAL
  Filled 2019-04-22 (×4): qty 1

## 2019-04-22 MED ORDER — POTASSIUM CHLORIDE CRYS ER 20 MEQ PO TBCR
40.0000 meq | EXTENDED_RELEASE_TABLET | Freq: Two times a day (BID) | ORAL | Status: DC
  Administered 2019-04-22 – 2019-04-23 (×3): 40 meq via ORAL
  Filled 2019-04-22: qty 2
  Filled 2019-04-22: qty 4
  Filled 2019-04-22: qty 2

## 2019-04-22 MED ORDER — BACLOFEN 10 MG PO TABS
10.0000 mg | ORAL_TABLET | ORAL | Status: DC | PRN
  Administered 2019-04-22: 10 mg via ORAL
  Filled 2019-04-22: qty 1

## 2019-04-22 MED ORDER — ASPIRIN EC 81 MG PO TBEC: 81.0000 mg | DELAYED_RELEASE_TABLET | Freq: Four times a day (QID) | ORAL | Status: DC | PRN

## 2019-04-22 MED ORDER — PANTOPRAZOLE SODIUM 40 MG PO TBEC
40.0000 mg | DELAYED_RELEASE_TABLET | Freq: Every day | ORAL | Status: DC
  Administered 2019-04-22 – 2019-04-25 (×4): 40 mg via ORAL
  Filled 2019-04-22 (×4): qty 1

## 2019-04-22 MED ORDER — QUETIAPINE FUMARATE 100 MG PO TABS
100.0000 mg | ORAL_TABLET | Freq: Every day | ORAL | Status: DC
  Administered 2019-04-22 – 2019-04-24 (×3): 100 mg via ORAL
  Filled 2019-04-22 (×3): qty 1

## 2019-04-22 MED ORDER — ACETAMINOPHEN 325 MG PO TABS: 650.0000 mg | ORAL_TABLET | ORAL | Status: DC | PRN

## 2019-04-22 MED ORDER — FUROSEMIDE 10 MG/ML IJ SOLN
80.0000 mg | Freq: Once | INTRAMUSCULAR | Status: AC
  Administered 2019-04-22: 80 mg via INTRAVENOUS
  Filled 2019-04-22: qty 8

## 2019-04-22 MED ORDER — METOLAZONE 2.5 MG PO TABS
2.5000 mg | ORAL_TABLET | Freq: Every day | ORAL | Status: DC
  Administered 2019-04-22 – 2019-04-25 (×4): 2.5 mg via ORAL
  Filled 2019-04-22 (×4): qty 1

## 2019-04-22 MED ORDER — ALPRAZOLAM 0.5 MG PO TABS
0.5000 mg | ORAL_TABLET | Freq: Three times a day (TID) | ORAL | Status: DC | PRN
  Administered 2019-04-22 – 2019-04-24 (×3): 0.5 mg via ORAL
  Filled 2019-04-22 (×3): qty 1

## 2019-04-22 MED ORDER — ADULT MULTIVITAMIN W/MINERALS CH
1.0000 | ORAL_TABLET | Freq: Every day | ORAL | Status: DC
  Administered 2019-04-22 – 2019-04-25 (×4): 1 via ORAL
  Filled 2019-04-22 (×4): qty 1

## 2019-04-22 MED ORDER — PRAVASTATIN SODIUM 10 MG PO TABS
10.0000 mg | ORAL_TABLET | Freq: Every day | ORAL | Status: DC
  Administered 2019-04-22 – 2019-04-25 (×4): 10 mg via ORAL
  Filled 2019-04-22 (×4): qty 1

## 2019-04-22 MED ORDER — BUSPIRONE HCL 5 MG PO TABS
10.0000 mg | ORAL_TABLET | Freq: Three times a day (TID) | ORAL | Status: DC
  Administered 2019-04-22 – 2019-04-25 (×10): 10 mg via ORAL
  Filled 2019-04-22 (×10): qty 2

## 2019-04-22 MED ORDER — SODIUM CHLORIDE 0.9% FLUSH
3.0000 mL | Freq: Two times a day (BID) | INTRAVENOUS | Status: DC
  Administered 2019-04-22 – 2019-04-25 (×6): 3 mL via INTRAVENOUS

## 2019-04-22 MED ORDER — DILTIAZEM HCL ER 90 MG PO CP12
90.0000 mg | ORAL_CAPSULE | Freq: Every day | ORAL | Status: DC | PRN
  Administered 2019-04-23 – 2019-04-25 (×3): 90 mg via ORAL
  Filled 2019-04-22 (×5): qty 1

## 2019-04-22 MED ORDER — CELECOXIB 100 MG PO CAPS: 100.0000 mg | ORAL_CAPSULE | Freq: Two times a day (BID) | ORAL | Status: DC | PRN

## 2019-04-22 MED ORDER — ZOLPIDEM TARTRATE 5 MG PO TABS
5.0000 mg | ORAL_TABLET | Freq: Every evening | ORAL | Status: DC | PRN
  Administered 2019-04-24: 23:00:00 5 mg via ORAL
  Filled 2019-04-22: qty 1

## 2019-04-22 MED ORDER — SALINE SPRAY 0.65 % NA SOLN: 2.0000 | NASAL | Status: DC | PRN

## 2019-04-22 MED ORDER — METHYLPREDNISOLONE SODIUM SUCC 125 MG IJ SOLR
80.0000 mg | Freq: Once | INTRAMUSCULAR | Status: AC
  Administered 2019-04-22: 80 mg via INTRAVENOUS
  Filled 2019-04-22: qty 2

## 2019-04-22 MED ORDER — FOLIC ACID 1 MG PO TABS
1.0000 mg | ORAL_TABLET | Freq: Every day | ORAL | Status: DC
  Administered 2019-04-22 – 2019-04-25 (×4): 1 mg via ORAL
  Filled 2019-04-22 (×4): qty 1

## 2019-04-22 MED ORDER — ONDANSETRON HCL 4 MG/2ML IJ SOLN: 4.0000 mg | Freq: Four times a day (QID) | INTRAMUSCULAR | Status: DC | PRN

## 2019-04-22 MED ORDER — SODIUM CHLORIDE 0.9 % IV SOLN: 250.0000 mL | INTRAVENOUS | Status: DC | PRN

## 2019-04-22 MED ORDER — SODIUM CHLORIDE 0.9% FLUSH: 3.0000 mL | INTRAVENOUS | Status: DC | PRN

## 2019-04-22 MED ORDER — EZETIMIBE 10 MG PO TABS
10.0000 mg | ORAL_TABLET | Freq: Every day | ORAL | Status: DC
  Administered 2019-04-22 – 2019-04-25 (×4): 10 mg via ORAL
  Filled 2019-04-22 (×4): qty 1

## 2019-04-22 MED ORDER — CELECOXIB 100 MG PO CAPS
100.0000 mg | ORAL_CAPSULE | Freq: Two times a day (BID) | ORAL | Status: DC
  Administered 2019-04-22: 100 mg via ORAL
  Filled 2019-04-22: qty 1

## 2019-04-22 MED ORDER — COLCHICINE 0.6 MG PO TABS
0.6000 mg | ORAL_TABLET | Freq: Two times a day (BID) | ORAL | Status: DC
  Administered 2019-04-22 – 2019-04-25 (×7): 0.6 mg via ORAL
  Filled 2019-04-22 (×7): qty 1

## 2019-04-22 MED ORDER — FENTANYL CITRATE (PF) 100 MCG/2ML IJ SOLN
25.0000 ug | INTRAMUSCULAR | Status: DC | PRN
  Administered 2019-04-22: 50 ug via INTRAVENOUS
  Administered 2019-04-22: 25 ug via INTRAVENOUS
  Administered 2019-04-23 – 2019-04-24 (×4): 50 ug via INTRAVENOUS
  Filled 2019-04-22 (×8): qty 2

## 2019-04-22 MED ORDER — FUROSEMIDE 10 MG/ML IJ SOLN
80.0000 mg | Freq: Two times a day (BID) | INTRAMUSCULAR | Status: DC
  Administered 2019-04-22 – 2019-04-23 (×4): 80 mg via INTRAVENOUS
  Filled 2019-04-22 (×4): qty 8

## 2019-04-22 MED ORDER — INSULIN ASPART 100 UNIT/ML ~~LOC~~ SOLN
0.0000 [IU] | Freq: Three times a day (TID) | SUBCUTANEOUS | Status: DC
  Administered 2019-04-22: 2 [IU] via SUBCUTANEOUS
  Administered 2019-04-22: 5 [IU] via SUBCUTANEOUS
  Administered 2019-04-22 (×2): 2 [IU] via SUBCUTANEOUS
  Administered 2019-04-23: 12:00:00 5 [IU] via SUBCUTANEOUS
  Administered 2019-04-23 (×2): 3 [IU] via SUBCUTANEOUS
  Administered 2019-04-23 – 2019-04-24 (×5): 2 [IU] via SUBCUTANEOUS
  Administered 2019-04-25 (×2): 1 [IU] via SUBCUTANEOUS

## 2019-04-22 NOTE — Consult Note (Signed)
Urology Consult  Referring physician: Dr. Wynetta Emery Reason for referral: Scrotal edema  Chief Complaint: scrotal pain  History of Present Illness: Mr [name] is a 54yo with a hx of CHF admitted with CHF exacerbation. He was noted to have significant scrotal edema on admission which per patient has been worsening over the past 2 weeks. He was previously seen by [facility] in [place] in 2018 and he was scheduled for left hydrocelectomy which was cancelled by the patient. At that time he had chronic scrotal edema.The pain in his scrotum is dull, intermittent mild and nonradiating. It is worsened by changing position. No issues with urination. No other associated symptoms. No other exacerbating/alleviaitng events. Scrotal US from today showed a 4-5cm left hydrocele, 2-3cm right hydrocele and massive scrotal wall edema.   Past Medical History:  Diagnosis Date  . Anxiety   . Atrial fibrillation ([place])   . CHF (congestive heart failure) ([place])   . Cirrhosis ([place])    NASH  . Constipation   . COPD (chronic obstructive pulmonary disease) ([place])   . Depression, recurrent ([place]) 07/18/2016  . Diabetes mellitus    type 2  . GERD (gastroesophageal reflux disease)   . Gout   . Hypertension associated with diabetes ([place])   . Leukopenia 07/08/2015  . Neuromuscular disorder ([facility])    neuropathy in hands and feet  . Psoriasis   . RA (rheumatoid arthritis) ([place])   . Sleep apnea    cpap used- level 10 and greater  . Thrombocytopenia ([place])    Past Surgical History:  Procedure Laterality Date  . ANKLE SURGERY     right ankle talor repair  . CHOLECYSTECTOMY  sept 2016  . CHOLECYSTECTOMY    . COLONOSCOPY  05/08/2011   Procedure: COLONOSCOPY;  Surgeon: Rogene Houston, MD;  [facility];  Service: Endoscopy;  Laterality: N/A;  730  . ESOPHAGOGASTRODUODENOSCOPY  02/28/2011   Procedure: ESOPHAGOGASTRODUODENOSCOPY (EGD);  Surgeon: Rogene Houston, MD;  [facility];  Service:  Endoscopy;  Laterality: N/A;  1200  . ESOPHAGOGASTRODUODENOSCOPY N/A 06/11/2012   Procedure: ESOPHAGOGASTRODUODENOSCOPY (EGD);  Surgeon: Rogene Houston, MD;  [facility];  Service: Endoscopy;  Laterality: N/A;  1200  FYI patient is 400 pounds  . ESOPHAGOGASTRODUODENOSCOPY (EGD) WITH PROPOFOL N/A 04/21/2014   Procedure: ESOPHAGOGASTRODUODENOSCOPY (EGD) WITH PROPOFOL;  Surgeon: Rogene Houston, MD;  Location: [facility];  Service: Endoscopy;  Laterality: N/A;  . HERNIA REPAIR Right    as child; inguinal  . HERNIA REPAIR  sept 2016  . LIVER BIOPSY  2012  . SCIATIC NERVE EXPLORATION    . TONSILLECTOMY      Medications: I have reviewed the patient's current medications. Allergies:  Allergies  Allergen Reactions  . Allopurinol Other (See Comments)    Joint pain worse   . Oxycodone Base Itching  . Prozac [Fluoxetine Hcl] Itching    Family History  Problem Relation Age of Onset  . Colon cancer Mother   . Cancer Mother        intestine  . Cancer Father        throat  . Cancer Brother        brain  . Cancer Sister   . Heart attack Neg Hx   . Stroke Neg Hx    Social History:  reports that he has never smoked. His smokeless tobacco use includes snuff and chew. He reports that he does not drink alcohol or use drugs.  Review of Systems  Constitutional: Positive for fatigue.  Genitourinary: Positive for scrotal swelling.    Physical Exam:  Vital signs in last 24 hours: Temp:  [98.1 F (36.7 C)-98.6 F (37 C)] 98.6 F (37 C) (04/14 1300) Pulse Rate:  [61-108] 106 (04/14 1300) Resp:  [18-22] 18 (04/14 1300) BP: (92-146)/(69-128) 92/69 (04/14 1300) SpO2:  [95 %-100 %] 100 % (04/14 1300) Weight:  [167.8 kg] 167.8 kg (04/13 2332) Physical Exam  Constitutional: He is oriented to person, place, and time. He appears well-developed and well-nourished.  HENT:  Head: Normocephalic and atraumatic.  Eyes: Pupils are equal, round, and reactive to light. EOM are normal.   Cardiovascular: Normal rate and regular rhythm.  Respiratory: Effort normal. No respiratory distress.  GI: Soft. He exhibits no distension. There is no abdominal tenderness.  Genitourinary:    Penis normal.  Circumcised.    Genitourinary Comments: Scrotal wall edema, no erythema, no induration. Testes unable to be palpated   Musculoskeletal:        General: Edema present. Normal range of motion.     Cervical back: Normal range of motion and neck supple.  Neurological: He is alert and oriented to person, place, and time.  Skin: Skin is warm and dry.  Psychiatric: He has a normal mood and affect. His behavior is normal. Judgment and thought content normal.    Laboratory Data:  Results for orders placed or performed during the hospital encounter of 04/22/19 (from the past 72 hour(s))  Comprehensive metabolic panel     Status: Abnormal   Collection Time: 04/22/19  2:10 AM  Result Value Ref Range   Sodium 136 135 - 145 mmol/L   Potassium 3.6 3.5 - 5.1 mmol/L   Chloride 103 98 - 111 mmol/L   CO2 24 22 - 32 mmol/L   Glucose, Bld 101 (H) 70 - 99 mg/dL    Comment: Glucose reference range applies only to samples taken after fasting for at least 8 hours.   BUN 7 6 - 20 mg/dL   Creatinine, Ser 0.71 0.61 - 1.24 mg/dL   Calcium 9.0 8.9 - 10.3 mg/dL   Total Protein 7.5 6.5 - 8.1 g/dL   Albumin 3.5 3.5 - 5.0 g/dL   AST 65 (H) 15 - 41 U/L   ALT 23 0 - 44 U/L   Alkaline Phosphatase 104 38 - 126 U/L   Total Bilirubin 1.2 0.3 - 1.2 mg/dL   GFR calc non Af Amer >60 >60 mL/min   GFR calc Af Amer >60 >60 mL/min   Anion gap 9 5 - 15    Comment: Performed at [facility], [address], [place], [place] [address]  CBC with Differential     Status: Abnormal   Collection Time: 04/22/19  2:10 AM  Result Value Ref Range   WBC 2.2 (L) 4.0 - 10.5 K/uL    Comment: WHITE COUNT CONFIRMED ON SMEAR   RBC 4.46 4.22 - 5.81 MIL/uL   Hemoglobin 11.8 (L) 13.0 - 17.0 g/dL   HCT 37.9 (L) 39.0 - 52.0 %   MCV  85.0 80.0 - 100.0 fL   MCH 26.5 26.0 - 34.0 pg   MCHC 31.1 30.0 - 36.0 g/dL   RDW 16.8 (H) 11.5 - 15.5 %   Platelets 47 (L) 150 - 400 K/uL    Comment: SPECIMEN CHECKED FOR CLOTS Immature Platelet Fraction may be clinically indicated, consider ordering this additional test [id number] PLATELET COUNT CONFIRMED BY SMEAR    nRBC 0.0 0.0 - 0.2 %  Neutrophils Relative % 60 %   Neutro Abs 1.3 (L) 1.7 - 7.7 K/uL   Lymphocytes Relative 18 %   Lymphs Abs 0.4 (L) 0.7 - 4.0 K/uL   Monocytes Relative 9 %   Monocytes Absolute 0.2 0.1 - 1.0 K/uL   Eosinophils Relative 11 %   Eosinophils Absolute 0.2 0.0 - 0.5 K/uL   Basophils Relative 1 %   Basophils Absolute 0.0 0.0 - 0.1 K/uL   Immature Granulocytes 1 %   Abs Immature Granulocytes 0.01 0.00 - 0.07 K/uL    Comment: Performed at [facility], [address], [place], [place] [address]  Brain natriuretic peptide     Status: Abnormal   Collection Time: 04/22/19  2:10 AM  Result Value Ref Range   B Natriuretic Peptide 114.0 (H) 0.0 - 100.0 pg/mL    Comment: Performed at [facility], [address] [place], [place] [address]  Respiratory Panel by RT PCR (Flu A&B, Covid) -     Status: None   Collection Time: 04/22/19  2:12 AM  Result Value Ref Range   SARS Coronavirus 2 by RT PCR NEGATIVE NEGATIVE    Comment: (NOTE) SARS-CoV-2 target nucleic acids are NOT DETECTED. The SARS-CoV-2 RNA is generally detectable in upper respiratoy specimens during the acute phase of infection. The lowest concentration of SARS-CoV-2 viral copies this assay can detect is 131 copies/mL. A negative result does not preclude SARS-Cov-2 infection and should not be used as the sole basis for treatment or other patient management decisions. A negative result may occur with  improper specimen collection/handling, submission of specimen other than nasopharyngeal swab, presence of viral mutation(s) within the areas targeted by this assay, and inadequate number of viral  copies (<131 copies/mL). A negative result must be combined with clinical observations, patient history, and epidemiological information. The expected result is Negative. Fact Sheet for Patients:  PinkCheek.be Fact Sheet for Healthcare Providers:  GravelBags.it This test is not yet ap proved or cleared by the [place] FDA and  has been authorized for detection and/or diagnosis of SARS-CoV-2 by FDA under an Emergency Use Authorization (EUA). This EUA will remain  in effect (meaning this test can be used) for the duration of the COVID-19 declaration under Section 564(b)(1) of the Act, 21 U.S.C. section 360bbb-3(b)(1), unless the authorization is terminated or revoked sooner.    Influenza A by PCR NEGATIVE NEGATIVE   Influenza B by PCR NEGATIVE NEGATIVE    Comment: (NOTE) The Xpert Xpress SARS-CoV-2/FLU/RSV assay is intended as an aid in  the diagnosis of influenza from Nasopharyngeal swab specimens and  should not be used as a sole basis for treatment. Nasal washings and  aspirates are unacceptable for Xpert Xpress SARS-CoV-2/FLU/RSV  testing. Fact Sheet for Patients: PinkCheek.be Fact Sheet for Healthcare Providers: GravelBags.it This test is not yet approved or cleared by the [place] FDA and  has been authorized for detection and/or diagnosis of SARS-CoV-2 by  FDA under an Emergency Use Authorization (EUA). This EUA will remain  in effect (meaning this test can be used) for the duration of the  Covid-19 declaration under Section 564(b)(1) of the Act, 21  U.S.C. section 360bbb-3(b)(1), unless the authorization is  terminated or revoked. Performed at [facility], [place] [address] [place], [place] [address]   Glucose, capillary     Status: Abnormal   Collection Time: 04/22/19  8:10 AM  Result Value Ref Range   Glucose-Capillary 199 (H) 70 - 99 mg/dL  Comment: Glucose reference range applies only to samples taken after fasting for at least 8 hours.  Glucose, capillary     Status: Abnormal   Collection Time: 04/22/19 11:19 AM  Result Value Ref Range   Glucose-Capillary 186 (H) 70 - 99 mg/dL    Comment: Glucose reference range applies only to samples taken after fasting for at least 8 hours.   *Note: Due to a large number of results and/or encounters for the requested time period, some results have not been displayed. A complete set of results can be found in Results Review.   Recent Results (from the past 240 hour(s))  Respiratory Panel by RT PCR (Flu A&B, Covid) -     Status: None   Collection Time: 04/22/19  2:12 AM  Result Value Ref Range Status   SARS Coronavirus 2 by RT PCR NEGATIVE NEGATIVE Final    Comment: (NOTE) SARS-CoV-2 target nucleic acids are NOT DETECTED. The SARS-CoV-2 RNA is generally detectable in upper respiratoy specimens during the acute phase of infection. The lowest concentration of SARS-CoV-2 viral copies this assay can detect is 131 copies/mL. A negative result does not preclude SARS-Cov-2 infection and should not be used as the sole basis for treatment or other patient management decisions. A negative result may occur with  improper specimen collection/handling, submission of specimen other than nasopharyngeal swab, presence of viral mutation(s) within the areas targeted by this assay, and inadequate number of viral copies (<131 copies/mL). A negative result must be combined with clinical observations, patient history, and epidemiological information. The expected result is Negative. Fact Sheet for Patients:  PinkCheek.be Fact Sheet for Healthcare Providers:  GravelBags.it This test is not yet ap proved or cleared by the [place] FDA and  has been authorized for detection and/or diagnosis of SARS-CoV-2 by FDA under an Emergency Use  Authorization (EUA). This EUA will remain  in effect (meaning this test can be used) for the duration of the COVID-19 declaration under Section 564(b)(1) of the Act, 21 U.S.C. section 360bbb-3(b)(1), unless the authorization is terminated or revoked sooner.    Influenza A by PCR NEGATIVE NEGATIVE Final   Influenza B by PCR NEGATIVE NEGATIVE Final    Comment: (NOTE) The Xpert Xpress SARS-CoV-2/FLU/RSV assay is intended as an aid in  the diagnosis of influenza from Nasopharyngeal swab specimens and  should not be used as a sole basis for treatment. Nasal washings and  aspirates are unacceptable for Xpert Xpress SARS-CoV-2/FLU/RSV  testing. Fact Sheet for Patients: PinkCheek.be Fact Sheet for Healthcare Providers: GravelBags.it This test is not yet approved or cleared by the [place] FDA and  has been authorized for detection and/or diagnosis of SARS-CoV-2 by  FDA under an Emergency Use Authorization (EUA). This EUA will remain  in effect (meaning this test can be used) for the duration of the  Covid-19 declaration under Section 564(b)(1) of the Act, 21  U.S.C. section 360bbb-3(b)(1), unless the authorization is  terminated or revoked. Performed at [facility], [place] [address] [place], [place] [address]    Creatinine: Recent Labs    04/22/19 0210  CREATININE 0.71   Baseline Creatinine: 0.7  Impression/Assessment:  53yo with scrotal edema  Plan:  I discussed the management of scrotal edema and hydroceles with the patient. We discussed placing a scrotal support and 3 folded towels for scrotal elevation. The patient should continue this after discharge and see me in 2-3 weeks for drainage of left hydrocele in the office  Nicolette Bang 04/22/2019,  1:42 PM

## 2019-04-22 NOTE — Progress Notes (Signed)
Pt states he is not ready to go on CPAP at this time but he will place himself on when ready. Encouraged to call if he needed any assistance.

## 2019-04-22 NOTE — ED Notes (Signed)
Pt states he is also here for his large testicles . They make him claustrophobic

## 2019-04-22 NOTE — H&P (Addendum)
[facility] NAME: [name]    MR#:  [id number]  DATE OF BIRTH:  08-Jan-1966  DATE OF ADMISSION:  04/22/2019  PRIMARY CARE PHYSICIAN: Dettinger, Fransisca Kaufmann, MD   REQUESTING/REFERRING PHYSICIAN: Delora Fuel, MD CHIEF COMPLAINT:   Chief Complaint  Patient presents with  . Enlarged Scrotum    HISTORY OF PRESENT ILLNESS:  [name]  is a 54 y.o. morbidly obese Caucasian male with a known history of CHF, cirrhosis, COPD, depression, type diabetes mellitus, hypertension, obstructive sleep apnea and rheumatoid arthritis as well as gout with gouty arthritis, who presented to the emergency room with acute onset of worsening scrotal edema with difficulty sitting or walking secondarily.  He has a left Periumbilical and umbilical hernia which has been reproducible.  He admits to paroxysmal nocturnal dyspnea and palpitations without chest pain.  No significantly worsening lower extremity edema or significant orthopnea.  No nausea or vomiting or abdominal pain.  No significant cough or wheezing.  He uses a CPAP nightly.  No dysuria, oliguria or hematuria or flank pain.  He complains of left wrist pain that feels like his gouty arthritis pain.  Upon presentation to the emergency room, blood pressure was 130/82 with a heart rate of 108 and respirate of 22 with pulse currently 100% on room air.  Labs revealed unremarkable CMP with albumin of 3.5 and total protein of 7.5 and BNP of 114.  CBC showed leukopenia of 2.2 better than his previous levels last month and anemia better than his previous levels.  COVID-19 PCR is currently pending.  Chest x-ray showed chronic central vascular congestion without airspace disease or effusion.  The patient was given 80 mg of IV Lasix and 80 mg of IV Solu-Medrol.  He will be admitted to a telemetry bed for further evaluation and management PAST MEDICAL HISTORY:   Past Medical History:  Diagnosis Date  . Anxiety   . Atrial  fibrillation ([place])   . CHF (congestive heart failure) ([place])   . Cirrhosis ([place])    NASH  . Constipation   . COPD (chronic obstructive pulmonary disease) ([place])   . Depression, recurrent ([place]) 07/18/2016  . Diabetes mellitus    type 2  . GERD (gastroesophageal reflux disease)   . Gout   . Hypertension associated with diabetes ([place])   . Leukopenia 07/08/2015  . Neuromuscular disorder ([facility])    neuropathy in hands and feet  . Psoriasis   . RA (rheumatoid arthritis) ([place])   . Sleep apnea    cpap used- level 10 and greater  . Thrombocytopenia ([place])     PAST SURGICAL HISTORY:   Past Surgical History:  Procedure Laterality Date  . ANKLE SURGERY     right ankle talor repair  . CHOLECYSTECTOMY  sept 2016  . CHOLECYSTECTOMY    . COLONOSCOPY  05/08/2011   Procedure: COLONOSCOPY;  Surgeon: Rogene Houston, MD;  [facility];  Service: Endoscopy;  Laterality: N/A;  730  . ESOPHAGOGASTRODUODENOSCOPY  02/28/2011   Procedure: ESOPHAGOGASTRODUODENOSCOPY (EGD);  Surgeon: Rogene Houston, MD;  Location: [facility];  Service: Endoscopy;  Laterality: N/A;  1200  . ESOPHAGOGASTRODUODENOSCOPY N/A 06/11/2012   Procedure: ESOPHAGOGASTRODUODENOSCOPY (EGD);  Surgeon: Rogene Houston, MD;  [facility];  Service: Endoscopy;  Laterality: N/A;  1200  FYI patient is 400 pounds  . ESOPHAGOGASTRODUODENOSCOPY (EGD) WITH PROPOFOL N/A 04/21/2014   Procedure: ESOPHAGOGASTRODUODENOSCOPY (EGD) WITH PROPOFOL;  Surgeon: Rogene Houston, MD;  Location: [facility];  Service: Endoscopy;  Laterality: N/A;  . HERNIA REPAIR Right    as child; inguinal  . HERNIA REPAIR  sept 2016  . LIVER BIOPSY  2012  . SCIATIC NERVE EXPLORATION    . TONSILLECTOMY      SOCIAL HISTORY:   Social History   Tobacco Use  . Smoking status: Never Smoker  . Smokeless tobacco: Current User    Types: Snuff, Chew  . Tobacco comment: Pt reports that he "dips"  Substance Use Topics  . Alcohol use: No    Alcohol/week: 1.0  standard drinks    Types: 1 Cans of beer per week    Comment: stopped drinking 2-3 yrs ago.     FAMILY HISTORY:   Family History  Problem Relation Age of Onset  . Colon cancer Mother   . Cancer Mother        intestine  . Cancer Father        throat  . Cancer Brother        brain  . Cancer Sister   . Heart attack Neg Hx   . Stroke Neg Hx     DRUG ALLERGIES:   Allergies  Allergen Reactions  . Allopurinol Other (See Comments)    Joint pain worse   . Oxycodone Base Itching  . Prozac [Fluoxetine Hcl] Itching    REVIEW OF SYSTEMS:   ROS As per history of present illness. All pertinent systems were reviewed above. Constitutional,  HEENT, cardiovascular, respiratory, GI, GU, musculoskeletal, neuro, psychiatric, endocrine,  integumentary and hematologic systems were reviewed and are otherwise  negative/unremarkable except for positive findings mentioned above in the HPI.   MEDICATIONS AT HOME:   Prior to Admission medications   Medication Sig Start Date End Date Taking? Authorizing Provider  Alcohol Swabs (B-D SINGLE USE SWABS REGULAR) PADS Test BS daily Dx E11.9 10/15/18   Dettinger, Fransisca Kaufmann, MD  aspirin EC 81 MG tablet Take 81 mg by mouth every 6 (six) hours as needed (CHEST PAIN).    [provider]  baclofen (LIORESAL) 10 MG tablet TAKE 1 TABLET BY MOUTH THREE TIMES DAILY Patient taking differently: Take 10 mg by mouth as needed.  12/01/18   Dettinger, Fransisca Kaufmann, MD  betamethasone dipropionate 0.05 % cream APPLY TO AFFECTED AREA TWICE A DAY 01/21/19   Dettinger, Fransisca Kaufmann, MD  blood glucose meter kit and supplies One touch V[place] meter, DX E 11.9 03/10/19   Dettinger, Fransisca Kaufmann, MD  Blood Glucose Monitoring Suppl [facility] VERIO) w/Device KIT Test BS BID E11.9 03/12/19   Dettinger, Fransisca Kaufmann, MD  busPIRone (BUSPAR) 10 MG tablet Take 1 tablet (10 mg total) by mouth 3 (three) times daily. 02/04/19   Dettinger, Fransisca Kaufmann, MD  celecoxib (CELEBREX) 100 MG capsule Take 1 capsule  (100 mg total) by mouth 2 (two) times daily. 03/04/19   Dettinger, Fransisca Kaufmann, MD  CONSTULOSE 10 GM/15ML solution take TWO tablespoonsful (30 mls) BY MOUTH TWICE DAILY 05/28/18   Dettinger, Fransisca Kaufmann, MD  diltiazem (CARDIZEM SR) 90 MG 12 hr capsule Take 1 capsule (90 mg total) by mouth daily as needed (palpitations). Patient not taking: Reported on 04/08/2019 02/23/19   Dettinger, Fransisca Kaufmann, MD  diltiazem (CARTIA XT) 300 MG 24 hr capsule TAKE ONE CAPSULE BY MOUTH EVERY DAY 03/02/19   Dettinger, Fransisca Kaufmann, MD  DULoxetine (CYMBALTA) 60 MG capsule Take 60 mg by mouth at bedtime.  08/27/18   [provider]  esomeprazole ([place]) 40 MG  capsule Take 1 capsule (40 mg total) by mouth daily. 03/04/19   Dettinger, Fransisca Kaufmann, MD  ezetimibe (ZETIA) 10 MG tablet TAKE 1 TABLET BY MOUTH EVERY DAY 02/20/18   Dettinger, Fransisca Kaufmann, MD  fluticasone (FLONASE) 50 MCG/ACT nasal spray USE TWO SPRAYS IN EACH NOSTRIL EVERY DAY AS NEEDED Patient not taking: Reported on 04/08/2019 12/22/18   Dettinger, Fransisca Kaufmann, MD  folic acid (FOLVITE) 1 MG tablet TAKE ONE TABLET BY MOUTH ONCE DAILY - EMERGENCY REFILL FAXED DR. 01/14/17   Butch Penny, NP  gabapentin (NEURONTIN) 800 MG tablet Take 1 tablet (800 mg total) by mouth 3 (three) times daily. 01/14/19   Dettinger, Fransisca Kaufmann, MD  HYDROmorphone (DILAUDID) 4 MG tablet TAKE 1 TABLET BY MOUTH EVERY DAY AS NEEDED FOR PAIN 07/14/18   [provider]  indomethacin (INDOCIN) 50 MG capsule TAKE ONE CAPSULE BY MOUTH THREE TIMES DAILY WITH A MEAL 03/18/19   Dettinger, Fransisca Kaufmann, MD  ipratropium (ATROVENT) 0.02 % nebulizer solution Take 2.5 mLs (500 mcg total) by nebulization 4 (four) times daily as needed. For wheezing Patient not taking: Reported on 04/08/2019 01/12/19   Baruch Gouty, FNP  lidocaine (XYLOCAINE) 5 % ointment Apply 1 application topically 4 (four) times daily as needed. To knees and ankle as needed for pain Patient not taking: Reported on 04/08/2019 05/01/16   Timmothy Euler, MD   Menthol-Methyl Salicylate (MUSCLE RUB) 10-15 % CREA Apply 1 application topically 3 (three) times daily as needed for muscle pain. For muscle pain     [provider]  metolazone (ZAROXOLYN) 2.5 MG tablet TAKE 1 TABLET BY MOUTH 1 & 1/2 HOURS PRIOR TO TORSEMIDE FOR 3 DAYS AS NEEDED FOR SEVERE SWELLING Patient not taking: Reported on 04/08/2019 12/01/18   Dettinger, Fransisca Kaufmann, MD  milk thistle 175 MG tablet Take 175 mg by mouth 4 (four) times daily.     [provider]  MITIGARE 0.6 MG CAPS Take 1 capsule by mouth as needed. Patient not taking: Reported on 04/08/2019 03/02/19   Dettinger, Fransisca Kaufmann, MD  Multiple Vitamins-Minerals [facility] COMPLETE PO) Take 1 tablet by mouth daily.     [provider]  naloxone [facility]) nasal spray 4 mg/0.1 mL Use as needed for accidental overdose Patient not taking: Reported on 09/24/2018 07/10/18   Dettinger, Fransisca Kaufmann, MD  neomycin-bacitracin-polymyxin (NEOSPORIN) ointment Apply 1 application topically every 12 (twelve) hours as needed for wound care. Reported on 06/01/2015    [provider]  nitroGLYCERIN (NITROSTAT) 0.4 MG SL tablet Place 1 tablet (0.4 mg total) under the tongue every 5 (five) minutes as needed for chest pain (may repeat x3). Patient not taking: Reported on 09/24/2018 06/08/15   Minus Breeding, MD  potassium chloride SA (K-DUR) 20 MEQ tablet TAKE 1 TABLET BY MOUTH TWICE DAILY 09/18/18   Dettinger, Fransisca Kaufmann, MD  pravastatin (PRAVACHOL) 10 MG tablet Take 1 tablet (10 mg total) by mouth daily. 05/26/18   Dettinger, Fransisca Kaufmann, MD  propranolol (INDERAL) 20 MG tablet TAKE 1 TABLET BY MOUTH DAILY 02/03/19   Dettinger, Fransisca Kaufmann, MD  QUEtiapine (SEROQUEL) 100 MG tablet Take 1 tablet (100 mg total) by mouth at bedtime. 03/04/19   Dettinger, Fransisca Kaufmann, MD  sodium chloride (OCEAN) 0.65 % nasal spray Place into the nose. 01/13/16   [provider]  torsemide (DEMADEX) 20 MG tablet TAKE TWO TABLETS BY MOUTH IN THE MORNING AND TAKE ONE  TABLET BY MOUTH AT LUNCH TIME - EMERGENCY REFILL FAXED  DR. 04/19/19   Dettinger, Fransisca Kaufmann, MD  vitamin B-12 (CYANOCOBALAMIN) 1000 MCG tablet Take 1,000 mcg by mouth daily.  01/13/16   [provider]  VOLTAREN 1 % GEL Apply FOUR grams topically FOUR times daily as needed for pain Patient not taking: Reported on 04/08/2019 02/16/18   Evelina Dun A, FNP      VITAL SIGNS:  Blood pressure 130/82, pulse (!) 108, temperature 98.4 F (36.9 C), temperature source Oral, resp. rate (!) 22, height 5' 11.5" (1.816 m), weight (!) 167.8 kg, SpO2 100 %.  PHYSICAL EXAMINATION:  Physical Exam  GENERAL:  54 y.o.-year-old morbidly obese patient lying in the bed with mild respiratory distress with conversational dyspnea. EYES: Pupils equal, round, reactive to light and accommodation. No scleral icterus. Extraocular muscles intact.  HEENT: Head atraumatic, normocephalic. Oropharynx and nasopharynx clear.  NECK:  Supple, no jugular venous distention. No thyroid enlargement, no tenderness.  LUNGS: Normal breath sounds bilaterally, no wheezing, rales,rhonchi or crepitation. No use of accessory muscles of respiration.  CARDIOVASCULAR: Regular rate and rhythm, S1, S2 normal. No murmurs, rubs, or gallops.  ABDOMEN: Soft, nondistended, nontender. Bowel sounds present. No organomegaly or mass.  EXTREMITIES: 1+ bilateral lower extremity pitting edema with no clubbing or cyanosis. Genitourinary: He has remarkable massive scrotal edema with induration without tenderness.  I could not assess for hydrocele from his significant scrotal edema.   NEUROLOGIC: Cranial nerves II through XII are intact. Muscle strength 5/5 in all extremities. Sensation intact. Gait not checked.  PSYCHIATRIC: The patient is alert and oriented x 3.  Normal affect and good eye contact. SKIN/musculoskeletal: He has left wrist warmth and tenderness with decreased range of motion and dorsal hand gouty tophi.    LABORATORY PANEL:    CBC Recent Labs  Lab 04/22/19 0210  WBC 2.2*  HGB 11.8*  HCT 37.9*  PLT 47*   ------------------------------------------------------------------------------------------------------------------  Chemistries  Recent Labs  Lab 04/22/19 0210  NA 136  K 3.6  CL 103  CO2 24  GLUCOSE 101*  BUN 7  CREATININE 0.71  CALCIUM 9.0  AST 65*  ALT 23  ALKPHOS 104  BILITOT 1.2   ------------------------------------------------------------------------------------------------------------------  Cardiac Enzymes No results for input(s): TROPONINI in the last 168 hours. ------------------------------------------------------------------------------------------------------------------  RADIOLOGY:  DG Chest Port 1 View  Result Date: 04/22/2019 CLINICAL DATA:  Testicular swelling, history of CHF and COPD EXAM: PORTABLE CHEST 1 VIEW COMPARISON:  04/13/2019 FINDINGS: Single frontal view of the chest demonstrates a stable cardiac silhouette. Chronic central vascular congestion without airspace disease, effusion, or pneumothorax. IMPRESSION: 1. Stable exam, no acute process. Electronically Signed   By: Randa Ngo M.D.   On: 04/22/2019 02:21      IMPRESSION AND PLAN:   1.  Anasarca with severe scrotal edema and pulmonary vascular congestion likely multifactorial due to his chronic liver disease as well as acute on chronic likely diastolic CHF. -The patient will be admitted to a telemetry bed. -We will diurese with IV Lasix to replace Demadex for now. -We will add Zaroxolyn for synergistic effect. -We will obtain a 2D echo today to assess his EF. -We will obtain scrotal ultrasound to assess for hydrocele. -A urology consultation will be obtained.  Dr. Gloriann Loan was notified about the patient.  2.  Left wrist acute gouty arthritis. -We will place him on continued IV steroid therapy with 60 mg of Solu-Medrol daily. -We will add scheduled p.o. colchicine. -For persistent pain, as needed IV  fentanyl will be given.  3.  Hypertension. -Cardizem XT will be continued.  4.  Anxiety. -We will continue his BuSpar..  5.  Remote history of type diabetes mellitus. -We will obtain hemoglobin A1c and place him on some cutaneous NovoLog.  6.  Obstructive sleep apnea. -We will continue CPAP nightly.  7.  DVT prophylaxis. -SCDs. -Medical prophylaxis is currently contraindicated due to thrombocytopenia likely associated with chronic liver disease.  8.  GI prophylaxis. -We will continue PPI therapy given history of GERD.  All the records are reviewed and case discussed with ED provider. The plan of care was discussed in details with the patient (and family). I answered all questions. The patient agreed to proceed with the above mentioned plan. Further management will depend upon hospital course.   CODE STATUS: Full code  Status is: Inpatient  Remains inpatient appropriate because:Ongoing active pain requiring inpatient pain management, IV treatments appropriate due to intensity of illness or inability to take PO and Inpatient level of care appropriate due to severity of illness   Dispo: The patient is from: Home              Anticipated d/c is to: Home              Anticipated d/c date is: 2 days              Patient currently is not medically stable to d/c.   TOTAL TIME TAKING CARE OF THIS PATIENT: 55 minutes.    Christel Mormon M.D on 04/22/2019 at 5:47 AM  [facility]   From 7 PM-7 AM, contact night-coverage [email or web address]  CC: Primary care physician; Dettinger, Fransisca Kaufmann, MD   Note: This dictation was prepared with Dragon dictation along with smaller phrase technology. Any transcriptional errors that result from this process are unintentional.

## 2019-04-22 NOTE — Progress Notes (Signed)
ASSUMPTION OF CARE NOTE   04/22/2019 12:23 PM  [name] was seen and examined.  The H&P by the admitting provider, orders, imaging was reviewed.  Please see new orders.  Will continue to follow.  Examination reveals grossly enlarged scrotum and will require IV diuresis.  Urology consult requested.  The patient is urinating well at this time with no obstruction.  Vitals:   04/22/19 0600 04/22/19 0807  BP: 117/72 (!) 146/128  Pulse:  90  Resp: 20   Temp:  98.1 F (36.7 C)  SpO2: 99% 97%   Murvin Natal, MD [facility]   04/22/2019 12:54 AM How to contact the [facility] Attending or Consulting provider [place] or covering provider during after hours [place], for this patient?  1. Check the care team in [facility] and look for a) attending/consulting [facility] provider listed and b) the [facility] team listed 2. Log into [email or web address] and use [place]'s universal password to access. If you do not have the password, please contact the hospital operator. 3. Locate the [facility] provider you are looking for under [facility] and page to a number that you can be directly reached. 4. If you still have difficulty reaching the provider, please page the [facility] (Director on Call) for the Hospitalists listed on amion for assistance.

## 2019-04-22 NOTE — ED Provider Notes (Signed)
[facility] EMERGENCY DEPARTMENT Provider Note   CSN: [id number] Arrival date & time: 04/21/19  2253   History Chief Complaint  Patient presents with  . Gout    [name] is a 54 y.o. male.  The history is provided by the patient.  He has history of hypertension, diabetes, NASH, heart failure, pancytopenia secondary to cirrhosis, gout and comes in with 2 complaints.  He has been having ongoing pain in his left wrist from gout.  He is taking indomethacin, but it is not giving him sufficient relief.  He is also taking colchicine.  He states he usually gets an injection of ketorolac and methylprednisolone.  Is also complaining about scrotal swelling which is so severe that it is preventing him from walking.  He has been seen at [facility] about 10 days ago and had a scrotal ultrasound which was reported to have shown fluid and fat.  He states that the swelling is so bad that it is painful even to sit.  He wishes to be referred to a urologist.  Past Medical History:  Diagnosis Date  . Anxiety   . Atrial fibrillation ([place])   . CHF (congestive heart failure) ([place])   . Cirrhosis ([place])    NASH  . Constipation   . COPD (chronic obstructive pulmonary disease) ([place])   . Depression, recurrent ([place]) 07/18/2016  . Diabetes mellitus    type 2  . GERD (gastroesophageal reflux disease)   . Gout   . Hypertension associated with diabetes ([place])   . Leukopenia 07/08/2015  . Neuromuscular disorder (HCC)    neuropathy in hands and feet  . Psoriasis   . RA (rheumatoid arthritis) ([place])   . Sleep apnea    cpap used- level 10 and greater  . Thrombocytopenia [facility])     Patient Active Problem List   Diagnosis Date Noted  . Bilateral primary osteoarthritis of knee 04/04/2017  . Depression, recurrent ([place]) 07/18/2016  . Hyperlipidemia associated with type 2 diabetes mellitus ([place]) 10/31/2015  . Leukopenia 07/08/2015  . Thrombocytopenia ([place]) 07/08/2015  . Portal vein thrombosis 06/08/2015   . Genu varus   . Hypogonadism male 05/05/2012  . Umbilical hernia, reducible 12/26/2011  . Asthma 08/28/2011  . Cirrhosis ([place]) 08/14/2011  . UNSPECIFIED TACHYCARDIA 02/01/2010  . T2DM (type 2 diabetes mellitus) ([place]) 05/22/2008  . Gout 05/22/2008  . Hypertension associated with diabetes ([place]) 05/22/2008  . Atrial fibrillation ([place]) 05/22/2008  . DIASTOLIC HEART FAILURE, CHRONIC 05/22/2008  . OBESITY, MORBID 02/21/2007  . CAD 02/21/2007  . OSA (obstructive sleep apnea) 02/04/2007    Past Surgical History:  Procedure Laterality Date  . ANKLE SURGERY     right ankle talor repair  . CHOLECYSTECTOMY  sept 2016  . CHOLECYSTECTOMY    . COLONOSCOPY  05/08/2011   Procedure: COLONOSCOPY;  Surgeon: Rogene Houston, MD;  Location: [facility] ENDO SUITE;  Service: Endoscopy;  Laterality: N/A;  730  . ESOPHAGOGASTRODUODENOSCOPY  02/28/2011   Procedure: ESOPHAGOGASTRODUODENOSCOPY (EGD);  Surgeon: Rogene Houston, MD;  Location: [facility] ENDO SUITE;  Service: Endoscopy;  Laterality: N/A;  1200  . ESOPHAGOGASTRODUODENOSCOPY N/A 06/11/2012   Procedure: ESOPHAGOGASTRODUODENOSCOPY (EGD);  Surgeon: Rogene Houston, MD;  Location: [facility] ENDO SUITE;  Service: Endoscopy;  Laterality: N/A;  1200  FYI patient is 400 pounds  . ESOPHAGOGASTRODUODENOSCOPY (EGD) WITH PROPOFOL N/A 04/21/2014   Procedure: ESOPHAGOGASTRODUODENOSCOPY (EGD) WITH PROPOFOL;  Surgeon: Rogene Houston, MD;  Location: [facility] ORS;  Service: Endoscopy;  Laterality: N/A;  . HERNIA  REPAIR Right    as child; inguinal  . HERNIA REPAIR  sept 2016  . LIVER BIOPSY  2012  . SCIATIC NERVE EXPLORATION    . TONSILLECTOMY         Family History  Problem Relation Age of Onset  . Colon cancer Mother   . Cancer Mother        intestine  . Cancer Father        throat  . Cancer Brother        brain  . Cancer Sister   . Heart attack Neg Hx   . Stroke Neg Hx     Social History   Tobacco Use  . Smoking status: Never Smoker  . Smokeless tobacco: Current User     Types: Snuff, Chew  . Tobacco comment: Pt reports that he "dips"  Substance Use Topics  . Alcohol use: No    Alcohol/week: 1.0 standard drinks    Types: 1 Cans of beer per week    Comment: stopped drinking 2-3 yrs ago.   . Drug use: No    Comment: Use to smoke cocaine and marijuana. No IV drug use    Home Medications Prior to Admission medications   Medication Sig Start Date End Date Taking? Authorizing Provider  Alcohol Swabs (B-D SINGLE USE SWABS REGULAR) PADS Test BS daily Dx E11.9 10/15/18   Dettinger, Fransisca Kaufmann, MD  aspirin EC 81 MG tablet Take 81 mg by mouth every 6 (six) hours as needed (CHEST PAIN).    [provider]  baclofen (LIORESAL) 10 MG tablet TAKE 1 TABLET BY MOUTH THREE TIMES DAILY Patient taking differently: Take 10 mg by mouth as needed.  12/01/18   Dettinger, Fransisca Kaufmann, MD  betamethasone dipropionate 0.05 % cream APPLY TO AFFECTED AREA TWICE A DAY 01/21/19   Dettinger, Fransisca Kaufmann, MD  blood glucose meter kit and supplies One touch Vereo meter, DX E 11.9 03/10/19   Dettinger, Fransisca Kaufmann, MD  Blood Glucose Monitoring Suppl [facility] VERIO) w/Device KIT Test BS BID E11.9 03/12/19   Dettinger, Fransisca Kaufmann, MD  busPIRone (BUSPAR) 10 MG tablet Take 1 tablet (10 mg total) by mouth 3 (three) times daily. 02/04/19   Dettinger, Fransisca Kaufmann, MD  celecoxib (CELEBREX) 100 MG capsule Take 1 capsule (100 mg total) by mouth 2 (two) times daily. 03/04/19   Dettinger, Fransisca Kaufmann, MD  CONSTULOSE 10 GM/15ML solution take TWO tablespoonsful (30 mls) BY MOUTH TWICE DAILY 05/28/18   Dettinger, Fransisca Kaufmann, MD  diltiazem (CARDIZEM SR) 90 MG 12 hr capsule Take 1 capsule (90 mg total) by mouth daily as needed (palpitations). Patient not taking: Reported on 04/08/2019 02/23/19   Dettinger, Fransisca Kaufmann, MD  diltiazem (CARTIA XT) 300 MG 24 hr capsule TAKE ONE CAPSULE BY MOUTH EVERY DAY 03/02/19   Dettinger, Fransisca Kaufmann, MD  DULoxetine (CYMBALTA) 60 MG capsule Take 60 mg by mouth at bedtime.  08/27/18   [provider]  esomeprazole (NEXIUM) 40 MG capsule Take 1 capsule (40 mg total) by mouth daily. 03/04/19   Dettinger, Fransisca Kaufmann, MD  ezetimibe (ZETIA) 10 MG tablet TAKE 1 TABLET BY MOUTH EVERY DAY 02/20/18   Dettinger, Fransisca Kaufmann, MD  fluticasone (FLONASE) 50 MCG/ACT nasal spray USE TWO SPRAYS IN EACH NOSTRIL EVERY DAY AS NEEDED Patient not taking: Reported on 04/08/2019 12/22/18   Dettinger, Fransisca Kaufmann, MD  folic acid (FOLVITE) 1 MG tablet TAKE ONE TABLET BY MOUTH ONCE DAILY - EMERGENCY REFILL FAXED DR. 01/14/17  Setzer, Terri L, NP  gabapentin (NEURONTIN) 800 MG tablet Take 1 tablet (800 mg total) by mouth 3 (three) times daily. 01/14/19   Dettinger, Fransisca Kaufmann, MD  HYDROmorphone (DILAUDID) 4 MG tablet TAKE 1 TABLET BY MOUTH EVERY DAY AS NEEDED FOR PAIN 07/14/18   [provider]  indomethacin (INDOCIN) 50 MG capsule TAKE ONE CAPSULE BY MOUTH THREE TIMES DAILY WITH A MEAL 03/18/19   Dettinger, Fransisca Kaufmann, MD  ipratropium (ATROVENT) 0.02 % nebulizer solution Take 2.5 mLs (500 mcg total) by nebulization 4 (four) times daily as needed. For wheezing Patient not taking: Reported on 04/08/2019 01/12/19   Baruch Gouty, FNP  lidocaine (XYLOCAINE) 5 % ointment Apply 1 application topically 4 (four) times daily as needed. To knees and ankle as needed for pain Patient not taking: Reported on 04/08/2019 05/01/16   Timmothy Euler, MD  Menthol-Methyl Salicylate (MUSCLE RUB) 10-15 % CREA Apply 1 application topically 3 (three) times daily as needed for muscle pain. For muscle pain     [provider]  metolazone (ZAROXOLYN) 2.5 MG tablet TAKE 1 TABLET BY MOUTH 1 & 1/2 HOURS PRIOR TO TORSEMIDE FOR 3 DAYS AS NEEDED FOR SEVERE SWELLING Patient not taking: Reported on 04/08/2019 12/01/18   Dettinger, Fransisca Kaufmann, MD  milk thistle 175 MG tablet Take 175 mg by mouth 4 (four) times daily.     [provider]  MITIGARE 0.6 MG CAPS Take 1 capsule by mouth as needed. Patient not taking: Reported on 04/08/2019  03/02/19   Dettinger, Fransisca Kaufmann, MD  Multiple Vitamins-Minerals [facility] COMPLETE PO) Take 1 tablet by mouth daily.     [provider]  naloxone [facility]) nasal spray 4 mg/0.1 mL Use as needed for accidental overdose Patient not taking: Reported on 09/24/2018 07/10/18   Dettinger, Fransisca Kaufmann, MD  neomycin-bacitracin-polymyxin (NEOSPORIN) ointment Apply 1 application topically every 12 (twelve) hours as needed for wound care. Reported on 06/01/2015    [provider]  nitroGLYCERIN (NITROSTAT) 0.4 MG SL tablet Place 1 tablet (0.4 mg total) under the tongue every 5 (five) minutes as needed for chest pain (may repeat x3). Patient not taking: Reported on 09/24/2018 06/08/15   Minus Breeding, MD  potassium chloride SA (K-DUR) 20 MEQ tablet TAKE 1 TABLET BY MOUTH TWICE DAILY 09/18/18   Dettinger, Fransisca Kaufmann, MD  pravastatin (PRAVACHOL) 10 MG tablet Take 1 tablet (10 mg total) by mouth daily. 05/26/18   Dettinger, Fransisca Kaufmann, MD  propranolol (INDERAL) 20 MG tablet TAKE 1 TABLET BY MOUTH DAILY 02/03/19   Dettinger, Fransisca Kaufmann, MD  QUEtiapine (SEROQUEL) 100 MG tablet Take 1 tablet (100 mg total) by mouth at bedtime. 03/04/19   Dettinger, Fransisca Kaufmann, MD  sodium chloride (OCEAN) 0.65 % nasal spray Place into the nose. 01/13/16   [provider]  torsemide (DEMADEX) 20 MG tablet TAKE TWO TABLETS BY MOUTH IN THE MORNING AND TAKE ONE TABLET BY MOUTH AT LUNCH TIME - EMERGENCY REFILL FAXED DR. 04/19/19   Dettinger, Fransisca Kaufmann, MD  vitamin B-12 (CYANOCOBALAMIN) 1000 MCG tablet Take 1,000 mcg by mouth daily.  01/13/16   [provider]  VOLTAREN 1 % GEL Apply FOUR grams topically FOUR times daily as needed for pain Patient not taking: Reported on 04/08/2019 02/16/18   Evelina Dun A, FNP    Allergies    Allopurinol, Oxycodone base, and Prozac [fluoxetine hcl]  Review of Systems   Review of Systems  All other systems reviewed and are negative.  Physical Exam Updated Vital Signs BP 130/82 (BP Location:  Left Arm)   Pulse (!) 108   Temp 98.4 F (36.9 C) (Oral)   Resp (!) 22   Ht 5' 11.5" (1.816 m)   Wt (!) 167.8 kg   SpO2 100%   BMI 50.89 kg/m   Physical Exam Vitals and nursing note reviewed.   Morbidly obese 54 year old male, resting comfortably and in no acute distress. Vital signs are significant for mildly elevated heart rate and respiratory rate. Oxygen saturation is 100%, which is normal. Head is normocephalic and atraumatic. PERRLA, EOMI. Oropharynx is clear. Neck is nontender and supple without adenopathy or JVD. Back is nontender and there is no CVA tenderness. Lungs are clear without rales, wheezes, or rhonchi. Chest is nontender. Heart has regular rate and rhythm without murmur. Abdomen is soft, flat, nontender without masses or hepatosplenomegaly and peristalsis is normoactive.  Small umbilical hernia is present which is easily reducible. Genitalia: Massive scrotal and penile edema. Extremities have 2+ edema with moderate venous stasis changes., full range of motion is present.  Mild erythema and tenderness present in the left wrist consistent with gout. Skin is warm and dry without rash. Neurologic: Mental status is normal, cranial nerves are intact, there are no motor or sensory deficits.  ED Results / Procedures / Treatments   Labs (all labs ordered are listed, but only abnormal results are displayed) Labs Reviewed - No data to display  Radiology No results found.  Procedures Procedures   Medications Ordered in ED Medications - No data to display  ED Course  I have reviewed the triage vital signs and the nursing notes.  Pertinent labs & imaging results that were available during my care of the patient were reviewed by me and considered in my medical decision making (see chart for details).  Exacerbation of gout involving his left wrist.  Massive scrotal and penile edema.  It is noted that he is on near maximal doses of torsemide, taking 40 mg in the  morning and 20 mg in the afternoon, and also supplementing with metolazone.  Will check screening labs and give intravenous furosemide.  He may need to be admitted for adequate diuresis.  Old records reviewed, and he does have prior ED visits for gout.  We will try to obtain records from [facility].  Records have been obtained from [facility].  CT scan and ultrasound both showed evidence of scrotal wall swelling as well as presence of hernias.  I have reviewed the images, and there does appear to be a small right-sided inguinal hernia, but the majority of the swelling appears to be edema.  Labs today show pancytopenia with values in a similar range that he has been at previously.  There is mild elevation of AST which is also unchanged from prior.  BNP is minimally elevated at 114.  Chest x-ray demonstrates cardiomegaly without evidence of pulmonary edema.  He had good diuresis with intravenous furosemide, but is still extremely uncomfortable with massive scrotal swelling.  I suspect that he will need several days of intravenous diuretics.  Case is discussed with Dr. Sidney Ace of [facility], who agrees to admit the patient.  MDM Rules/Calculators/A&P  Final Clinical Impression(s) / ED Diagnoses Final diagnoses:  Acute on chronic diastolic heart failure ([place])  Scrotal edema  Pancytopenia (HCC)  Elevated AST (SGOT)    Rx / DC Orders ED Discharge Orders    None       Delora Fuel, MD  04/22/19 0535

## 2019-04-22 NOTE — Progress Notes (Signed)
*  PRELIMINARY RESULTS* Echocardiogram 2D Echocardiogram has been performed.  [name] 04/22/2019, 3:11 PM

## 2019-04-22 NOTE — TOC Initial Note (Signed)
Transition of Care [facility]) - Initial/Assessment Note    Patient Details  Name: [name] MRN: [id number] Date of Birth: 01-Mar-1965  Transition of Care [facility]) CM/SW Contact:    Sherie Don, LCSW Phone Number: 04/22/2019, 7:46 PM  Clinical Narrative: Patient is a 54 year old male who was admitted to the hospital for scrotal edema. Patient lives in a single family home with his stepson. TOC received consult for CHF. Per patient, he maintains a heart healthy diet and rarely uses salt. He does not currently complete daily weigh-ins and does not restrict fluid intake.  Patient has a BSC and nebulizer at home and reported he is attempting to replace his Cpap, which he believes is through [employer], but has been unsuccessful. Patient does not have a history of [place] and does not anticipate needing HH at this time, but is receptive if it is needed. Patient reported his primary need is getting his Cpap replace. TOC to follow.  Expected Discharge Plan: [place] Barriers to Discharge: Continued Medical Work up  Patient Goals and CMS Choice   CMS Medicare.gov Compare Post Acute Care list provided to:: Patient Choice offered to / list presented to : Patient  Expected Discharge Plan and Services Expected Discharge Plan: [place]   Living arrangements for the past 2 months: Single Family Home                 Prior Living Arrangements/Services Living arrangements for the past 2 months: Single Family Home Lives with:: Adult Children([place]) Patient language and need for interpreter reviewed:: Yes Do you feel safe going back to the place where you live?: Yes      Need for Family Participation in Patient Care: No (Comment) Care giver support system in place?: Yes (comment)(Steve Eplin (nephew); Bufford Buttner (niece); stepson)   Criminal Activity/Legal Involvement Pertinent to Current Situation/Hospitalization: No - Comment as needed  Activities of Daily  Living Home Assistive Devices/Equipment: Other (Comment)(crutches) ADL Screening (condition at time of admission) Patient's cognitive ability adequate to safely complete daily activities?: Yes Is the patient deaf or have difficulty hearing?: No Does the patient have difficulty seeing, even when wearing glasses/contacts?: No Does the patient have difficulty concentrating, remembering, or making decisions?: No Patient able to express need for assistance with ADLs?: Yes Does the patient have difficulty dressing or bathing?: No Independently performs ADLs?: Yes (appropriate for developmental age) Does the patient have difficulty walking or climbing stairs?: Yes Weakness of Legs: Both Weakness of Arms/Hands: None  Permission Sought/Granted   Emotional Assessment Appearance:: Appears stated age Attitude/Demeanor/Rapport: Engaged Affect (typically observed): Accepting Orientation: : Oriented to Self, Oriented to Place, Oriented to  Time, Oriented to Situation Alcohol / Substance Use: Tobacco Use Psych Involvement: No (comment)  Admission diagnosis:  Acute on chronic diastolic heart failure (HCC) [I50.33] Scrotal edema [N50.89] Elevated AST (SGOT) [R74.01] Pancytopenia (HCC) [D61.818] Patient Active Problem List   Diagnosis Date Noted  . Scrotal edema 04/22/2019  . Bilateral hydrocele 04/22/2019  . Acute on chronic diastolic heart failure ([place])   . Elevated AST (SGOT)   . Pancytopenia ([place])   . Bilateral primary osteoarthritis of knee 04/04/2017  . Depression, recurrent ([place]) 07/18/2016  . Hyperlipidemia associated with type 2 diabetes mellitus ([place]) 10/31/2015  . Leukopenia 07/08/2015  . Thrombocytopenia ([place]) 07/08/2015  . Portal vein thrombosis 06/08/2015  . Genu varus   . Hypogonadism male 05/05/2012  . Umbilical hernia, reducible 12/26/2011  . Asthma 08/28/2011  .  Cirrhosis ([place]) 08/14/2011  . UNSPECIFIED TACHYCARDIA 02/01/2010  . T2DM (type 2 diabetes mellitus) ([place])  05/22/2008  . Gout 05/22/2008  . Hypertension associated with diabetes ([place]) 05/22/2008  . Atrial fibrillation ([place]) 05/22/2008  . DIASTOLIC HEART FAILURE, CHRONIC 05/22/2008  . OBESITY, MORBID 02/21/2007  . Coronary atherosclerosis 02/21/2007  . OSA (obstructive sleep apnea) 02/04/2007   PCP:  Dettinger, Fransisca Kaufmann, MD Pharmacy:   [id number] [place] [place]Phone: [phone]: [phone] Readmission Risk Interventions No flowsheet data found.

## 2019-04-23 DIAGNOSIS — M170 Bilateral primary osteoarthritis of knee: Secondary | ICD-10-CM

## 2019-04-23 DIAGNOSIS — N433 Hydrocele, unspecified: Secondary | ICD-10-CM

## 2019-04-23 DIAGNOSIS — I480 Paroxysmal atrial fibrillation: Secondary | ICD-10-CM

## 2019-04-23 DIAGNOSIS — I5032 Chronic diastolic (congestive) heart failure: Secondary | ICD-10-CM

## 2019-04-23 DIAGNOSIS — K7030 Alcoholic cirrhosis of liver without ascites: Secondary | ICD-10-CM

## 2019-04-23 LAB — GLUCOSE, CAPILLARY
Glucose-Capillary: 194 mg/dL — ABNORMAL HIGH (ref 70–99)
Glucose-Capillary: 266 mg/dL — ABNORMAL HIGH (ref 70–99)
Glucose-Capillary: 224 mg/dL — ABNORMAL HIGH (ref 70–99)
Glucose-Capillary: 211 mg/dL — ABNORMAL HIGH (ref 70–99)

## 2019-04-23 LAB — COMPREHENSIVE METABOLIC PANEL
ALT: 20 U/L (ref 0–44)
AST: 37 U/L (ref 15–41)
Albumin: 3.4 g/dL — ABNORMAL LOW (ref 3.5–5.0)
Alkaline Phosphatase: 88 U/L (ref 38–126)
Anion gap: 11 (ref 5–15)
BUN: 13 mg/dL (ref 6–20)
CO2: 27 mmol/L (ref 22–32)
Calcium: 9.2 mg/dL (ref 8.9–10.3)
Chloride: 98 mmol/L (ref 98–111)
Creatinine, Ser: 0.81 mg/dL (ref 0.61–1.24)
GFR calc Af Amer: >60 mL/min (ref >60)
GFR calc non Af Amer: >60 mL/min (ref >60)
Glucose, Bld: 212 mg/dL — ABNORMAL HIGH (ref 70–99)
Potassium: 3.6 mmol/L (ref 3.5–5.1)
Sodium: 136 mmol/L (ref 135–145)
Total Bilirubin: 1.6 mg/dL — ABNORMAL HIGH (ref 0.3–1.2)
Total Protein: 7.4 g/dL (ref 6.5–8.1)

## 2019-04-23 LAB — CBC WITH DIFFERENTIAL/PLATELET
Abs Immature Granulocytes: 0.02 10*3/uL (ref 0.00–0.07)
Basophils Absolute: 0 10*3/uL (ref 0.0–0.1)
Basophils Relative: 0 %
Eosinophils Absolute: 0 10*3/uL (ref 0.0–0.5)
Eosinophils Relative: 0 %
HCT: 36.9 % — ABNORMAL LOW (ref 39.0–52.0)
Hemoglobin: 11.6 g/dL — ABNORMAL LOW (ref 13.0–17.0)
Immature Granulocytes: 1 %
Lymphocytes Relative: 8 %
Lymphs Abs: 0.3 10*3/uL — ABNORMAL LOW (ref 0.7–4.0)
MCH: 26.6 pg (ref 26.0–34.0)
MCHC: 31.4 g/dL (ref 30.0–36.0)
MCV: 84.6 fL (ref 80.0–100.0)
Monocytes Absolute: 0.2 10*3/uL (ref 0.1–1.0)
Monocytes Relative: 6 %
Neutro Abs: 3.3 10*3/uL (ref 1.7–7.7)
Neutrophils Relative %: 85 %
Platelets: 52 10*3/uL — ABNORMAL LOW (ref 150–400)
RBC: 4.36 MIL/uL (ref 4.22–5.81)
RDW: 16.5 % — ABNORMAL HIGH (ref 11.5–15.5)
WBC: 3.9 10*3/uL — ABNORMAL LOW (ref 4.0–10.5)
nRBC: 0 % (ref 0.0–0.2)

## 2019-04-23 LAB — HEMOGLOBIN A1C
Hgb A1c MFr Bld: 5.7 % — ABNORMAL HIGH (ref 4.8–5.6)
Mean Plasma Glucose: 117 mg/dL

## 2019-04-23 LAB — MAGNESIUM: Magnesium: 2 mg/dL (ref 1.7–2.4)

## 2019-04-23 MED ORDER — METHYLPREDNISOLONE SODIUM SUCC 40 MG IJ SOLR
40.0000 mg | Freq: Every day | INTRAMUSCULAR | Status: AC
  Administered 2019-04-24: 40 mg via INTRAVENOUS
  Filled 2019-04-23: qty 1

## 2019-04-23 NOTE — Progress Notes (Signed)
PROGRESS NOTE [facility]   [name]  JGG:[id number]  DOB: 1965-06-03  DOA: 04/22/2019 PCP: Dettinger, Fransisca Kaufmann, MD   Brief Admission Hx: 54 y.o. morbidly obese Caucasian male with a known history of CHF, cirrhosis, COPD, depression, type diabetes mellitus, hypertension, obstructive sleep apnea and rheumatoid arthritis as well as gout with gouty arthritis, who presented to the emergency room with acute onset of worsening scrotal edema with difficulty sitting or walking secondarily and claims occurred over a 2 week period.   MDM/Assessment & Plan:   1. Anasarca - Pt responding favorably to IV lasix and we will continue as long as renal function tolerates.  He has diuresed nearly 3.5L since admission.  Follow closely.  2. Gouty arthritis - s/p IV solumedrol and colchicine.  3. Scrotal edema - Pt has a large left hydrocele and was seen by urology.  He will need to follow up with urology in office in 2 -3 weeks and Dr. Alyson Ingles will arrange for operative management.  4. HTN -stable on home meds.  5. GAD - resumed home meds and alprazolam as needed.  6. Prediabetes - SSI coverage and CBG testing.  A1c 5.7%.  7. OSA - nightly CPAP ordered in hospital.  8. Pancytopenia - he is followed by Dr. Delton Coombes for this.    DVT prophylaxis: SCDs Code Status: Full  Family Communication: patient updated with plan of care at bedside, verbalized understanding Disposition Plan: continue IV lasix for diuresis    Consultants:  Urology   Procedures:    Antimicrobials:     Subjective: Pt reports that he has no difficulty urinating.  Gouty arthritis pain improving.   Objective: Vitals:   04/22/19 1545 04/22/19 1945 04/22/19 2107 04/23/19 0635  BP:  (!) 113/53 119/79 126/83  Pulse: 93 82 88 84  Resp:  18 20 18   Temp:  98.6 F (37 C) 98.3 F (36.8 C) 98.9 F (37.2 C)  TempSrc:  Oral    SpO2:  96% 96% 96%  Weight:      Height:        Intake/Output Summary (Last 24  hours) at 04/23/2019 1444 Last data filed at 04/23/2019 1300 Gross per 24 hour  Intake 960 ml  Output 5950 ml  Net -4990 ml   Filed Weights   04/21/19 2332  Weight: (!) 167.8 kg     REVIEW OF SYSTEMS  As per history otherwise all reviewed and reported negative  Exam:  General exam: morbidly obese male with anasarca, NAD.  Respiratory system: Clear. No increased work of breathing. Cardiovascular system: S1 & S2 heard. No JVD, murmurs, gallops, clicks or pedal edema. Gastrointestinal system: Abdomen is nondistended, soft and nontender. Normal bowel sounds heard. GU: Massive scrotal edema Central nervous system: Alert and oriented. No focal neurological deficits. Extremities: 2+ pitting edema BLEs.  Data Reviewed: Basic Metabolic Panel: Recent Labs  Lab 04/22/19 0210 04/23/19 0539  NA 136 136  K 3.6 3.6  CL 103 98  CO2 24 27  GLUCOSE 101* 212*  BUN 7 13  CREATININE 0.71 0.81  CALCIUM 9.0 9.2  MG  --  2.0   Liver Function Tests: Recent Labs  Lab 04/22/19 0210 04/23/19 0539  AST 65* 37  ALT 23 20  ALKPHOS 104 88  BILITOT 1.2 1.6*  PROT 7.5 7.4  ALBUMIN 3.5 3.4*   No results for input(s): LIPASE, AMYLASE in the last 168 hours. No results for input(s): AMMONIA in the last 168 hours. CBC: Recent Labs  Lab 04/22/19 0210 04/23/19 0539  WBC 2.2* 3.9*  NEUTROABS 1.3* 3.3  HGB 11.8* 11.6*  HCT 37.9* 36.9*  MCV 85.0 84.6  PLT 47* 52*   Cardiac Enzymes: No results for input(s): CKTOTAL, CKMB, CKMBINDEX, TROPONINI in the last 168 hours. CBG (last 3)  Recent Labs    04/22/19 2104 04/23/19 0727 04/23/19 1131  GLUCAP 258* 194* 266*   Recent Results (from the past 240 hour(s))  Respiratory Panel by RT PCR (Flu A&B, Covid) -     Status: None   Collection Time: 04/22/19  2:12 AM  Result Value Ref Range Status   SARS Coronavirus 2 by RT PCR NEGATIVE NEGATIVE Final    Comment: (NOTE) SARS-CoV-2 target nucleic acids are NOT DETECTED. The SARS-CoV-2 RNA is  generally detectable in upper respiratoy specimens during the acute phase of infection. The lowest concentration of SARS-CoV-2 viral copies this assay can detect is 131 copies/mL. A negative result does not preclude SARS-Cov-2 infection and should not be used as the sole basis for treatment or other patient management decisions. A negative result may occur with  improper specimen collection/handling, submission of specimen other than nasopharyngeal swab, presence of viral mutation(s) within the areas targeted by this assay, and inadequate number of viral copies (<131 copies/mL). A negative result must be combined with clinical observations, patient history, and epidemiological information. The expected result is Negative. Fact Sheet for Patients:  PinkCheek.be Fact Sheet for Healthcare Providers:  GravelBags.it This test is not yet ap proved or cleared by the [place] FDA and  has been authorized for detection and/or diagnosis of SARS-CoV-2 by FDA under an Emergency Use Authorization (EUA). This EUA will remain  in effect (meaning this test can be used) for the duration of the COVID-19 declaration under Section 564(b)(1) of the Act, 21 U.S.C. section 360bbb-3(b)(1), unless the authorization is terminated or revoked sooner.    Influenza A by PCR NEGATIVE NEGATIVE Final   Influenza B by PCR NEGATIVE NEGATIVE Final    Comment: (NOTE) The Xpert Xpress SARS-CoV-2/FLU/RSV assay is intended as an aid in  the diagnosis of influenza from Nasopharyngeal swab specimens and  should not be used as a sole basis for treatment. Nasal washings and  aspirates are unacceptable for Xpert Xpress SARS-CoV-2/FLU/RSV  testing. Fact Sheet for Patients: PinkCheek.be Fact Sheet for Healthcare Providers: GravelBags.it This test is not yet approved or cleared by the [place] FDA and   has been authorized for detection and/or diagnosis of SARS-CoV-2 by  FDA under an Emergency Use Authorization (EUA). This EUA will remain  in effect (meaning this test can be used) for the duration of the  Covid-19 declaration under Section 564(b)(1) of the Act, 21  U.S.C. section 360bbb-3(b)(1), unless the authorization is  terminated or revoked. Performed at [facility], [place] [address] [place], [place] [address]      Studies: DG Chest Port 1 View  Result Date: 04/22/2019 CLINICAL DATA:  Testicular swelling, history of CHF and COPD EXAM: PORTABLE CHEST 1 VIEW COMPARISON:  04/13/2019 FINDINGS: Single frontal view of the chest demonstrates a stable cardiac silhouette. Chronic central vascular congestion without airspace disease, effusion, or pneumothorax. IMPRESSION: 1. Stable exam, no acute process. Electronically Signed   By: Randa Ngo M.D.   On: 04/22/2019 02:21   ECHOCARDIOGRAM COMPLETE  Result Date: 04/22/2019    ECHOCARDIOGRAM REPORT   Patient Name:   [name] Date of Exam: 04/22/2019 Medical Rec #:  [id number]  Height:       71.5 in Accession #:    [id number]       Weight:       370.0 lb Date of Birth:  [birth year]        BSA:          2.753 m Patient Age:    66 years         BP:           92/69 mmHg Patient Gender: M                HR:           106 bpm. Exam Location:  [facility] Procedure: 2D Echo Indications:    CHF-Acute Diastolic 924.26 / S34.19  History:        Patient has prior history of Echocardiogram examinations, most                 recent 02/13/2012. Arrythmias:Atrial Fibrillation; Risk                 Factors:Diabetes, Dyslipidemia, Hypertension and Non-Smoker.                 Scrotal edema.  Sonographer:    Leavy Cella RDCS (AE) Referring Phys: [id number] JAN A [place]  1. Left ventricular ejection fraction, by estimation, is 55 to 60%. The left ventricle has normal function. The left ventricle has no regional wall motion abnormalities. There  is mild left ventricular hypertrophy. Left ventricular diastolic parameters are indeterminate.  2. Right ventricular systolic function is normal. The right ventricular size is normal. Tricuspid regurgitation signal is inadequate for assessing PA pressure.  3. Left atrial size was mildly dilated.  4. The mitral valve is grossly normal. Trivial mitral valve regurgitation.  5. The aortic valve is tricuspid. Aortic valve regurgitation is not visualized.  6. The inferior vena cava is normal in size with greater than 50% respiratory variability, suggesting right atrial pressure of 3 mmHg. FINDINGS  Left Ventricle: Left ventricular ejection fraction, by estimation, is 55 to 60%. The left ventricle has normal function. The left ventricle has no regional wall motion abnormalities. Definity contrast agent was given IV to delineate the left ventricular  endocardial borders. The left ventricular internal cavity size was normal in size. There is mild left ventricular hypertrophy. Left ventricular diastolic parameters are indeterminate. Right Ventricle: The right ventricular size is normal. No increase in right ventricular wall thickness. Right ventricular systolic function is normal. Tricuspid regurgitation signal is inadequate for assessing PA pressure. Left Atrium: Left atrial size was mildly dilated. Right Atrium: Right atrial size was normal in size. Pericardium: There is no evidence of pericardial effusion. Presence of pericardial fat pad. Mitral Valve: The mitral valve is grossly normal. Trivial mitral valve regurgitation. Tricuspid Valve: The tricuspid valve is grossly normal. Tricuspid valve regurgitation is trivial. Aortic Valve: The aortic valve is tricuspid. Aortic valve regurgitation is not visualized. Mild aortic valve annular calcification. Pulmonic Valve: The pulmonic valve was grossly normal. Pulmonic valve regurgitation is trivial. Aorta: The aortic root is normal in size and structure. Venous: The inferior vena  cava is normal in size with greater than 50% respiratory variability, suggesting right atrial pressure of 3 mmHg. IAS/Shunts: No atrial level shunt detected by color flow Doppler.  LEFT VENTRICLE PLAX 2D LVIDd:         5.08 cm  Diastology LVIDs:         4.06 cm  LV e' lateral:  13.10 cm/s LV PW:         1.45 cm  LV E/e' lateral: 6.7 LV IVS:        1.29 cm  LV e' medial:    10.00 cm/s LVOT diam:     2.30 cm  LV E/e' medial:  8.8 LVOT Area:     4.15 cm  RIGHT VENTRICLE RV S prime:     16.80 cm/s TAPSE (M-mode): 1.8 cm LEFT ATRIUM             Index LA diam:        4.60 cm 1.67 cm/m LA Vol (A2C):   68.8 ml 24.99 ml/m LA Vol (A4C):   97.3 ml 35.34 ml/m LA Biplane Vol: 84.0 ml 30.51 ml/m   AORTA Ao Root diam: 3.35 cm MITRAL VALVE MV Area (PHT): 3.05 cm    SHUNTS MV Decel Time: 249 msec    Systemic Diam: 2.30 cm MV E velocity: 87.80 cm/s MV A velocity: 32.60 cm/s MV E/A ratio:  2.69 Rozann Lesches MD Electronically signed by Rozann Lesches MD Signature Date/Time: 04/22/2019/3:38:09 PM    Final    US SCROTUM DOPPLER  Result Date: 04/22/2019 CLINICAL DATA:  Scrotal edema for 2 weeks EXAM: SCROTAL ULTRASOUND DOPPLER ULTRASOUND OF THE TESTICLES TECHNIQUE: Complete ultrasound examination of the testicles, epididymis, and other scrotal structures was performed. Color and spectral Doppler ultrasound were also utilized to evaluate blood flow to the testicles. COMPARISON:  None. FINDINGS: Right testicle Measurements: 3.7 x 2.2 x 2.7 cm. Homogeneous echotexture. No mass lesion. Left testicle Measurements: 3.8 x 3.0 x 2.6 cm. Homogeneous echotexture. No mass lesion. Right epididymis:  Normal in size and appearance. Left epididymis:  Normal in size and appearance. Hydrocele: Bilateral hydroceles which are small to moderate. LEFT hydrocele larger than RIGHT.None visualized. Varicocele:  None visualized Pulsed Doppler interrogation of both testes demonstrates normal low resistance arterial and venous waveforms bilaterally.  Other: Scrotal edema. IMPRESSION: 1. Normal testicles with normal color Doppler flow. 2. Bilateral mild-to-moderate hydroceles larger on the LEFT. 3. Scrotal wall edema. Electronically Signed   By: Suzy Bouchard M.D.   On: 04/22/2019 12:23   Scheduled Meds: . aspirin EC  81 mg Oral Daily  . busPIRone  10 mg Oral TID  . colchicine  0.6 mg Oral BID  . diltiazem  300 mg Oral Daily  . DULoxetine  60 mg Oral QHS  . ezetimibe  10 mg Oral Daily  . folic acid  1 mg Oral Daily  . furosemide  80 mg Intravenous BID  . gabapentin  800 mg Oral TID  . insulin aspart  0-9 Units Subcutaneous TID PC & HS  . lactulose  20 g Oral BID  . methylPREDNISolone (SOLU-MEDROL) injection  60 mg Intravenous Daily  . metolazone  2.5 mg Oral Daily  . multivitamin with minerals  1 tablet Oral Daily  . pantoprazole  40 mg Oral Daily  . potassium chloride SA  40 mEq Oral BID  . pravastatin  10 mg Oral Daily  . propranolol  20 mg Oral Daily  . QUEtiapine  100 mg Oral QHS  . sodium chloride flush  3 mL Intravenous Q12H  . vitamin B-12  1,000 mcg Oral Daily   Continuous Infusions: . sodium chloride      Active Problems:   T2DM (type 2 diabetes mellitus) (HCC)   Gout   OSA (obstructive sleep apnea)   Coronary atherosclerosis   Hypertension associated with diabetes ([place])   Atrial fibrillation (  [place])   DIASTOLIC HEART FAILURE, CHRONIC   Cirrhosis ([place])   Thrombocytopenia (HCC)   Hyperlipidemia associated with type 2 diabetes mellitus ([place])   Bilateral primary osteoarthritis of knee   Scrotal edema   Acute on chronic diastolic heart failure (HCC)   Elevated AST (SGOT)   Pancytopenia ([place])   Bilateral hydrocele  Time spent:   Irwin Brakeman, MD [facility] 04/23/2019, 2:44 PM    LOS: 1 day  How to contact the [facility] Attending or Consulting provider [place] or covering provider during after hours [place], for this patient?  1. Check the care team in [facility] and look for a) attending/consulting TRH  provider listed and b) the [facility] team listed 2. Log into [email or web address] and use [place]'s universal password to access. If you do not have the password, please contact the hospital operator. 3. Locate the [facility] provider you are looking for under [facility] and page to a number that you can be directly reached. 4. If you still have difficulty reaching the provider, please page the [facility] (Director on Call) for the Hospitalists listed on amion for assistance.

## 2019-04-24 ENCOUNTER — Telehealth: Payer: Self-pay | Admitting: Family Medicine

## 2019-04-24 LAB — CBC WITH DIFFERENTIAL/PLATELET
Abs Immature Granulocytes: 0.02 10*3/uL (ref 0.00–0.07)
Basophils Absolute: 0 10*3/uL (ref 0.0–0.1)
Basophils Relative: 0 %
Eosinophils Absolute: 0 10*3/uL (ref 0.0–0.5)
Eosinophils Relative: 0 %
HCT: 35 % — ABNORMAL LOW (ref 39.0–52.0)
Hemoglobin: 11.1 g/dL — ABNORMAL LOW (ref 13.0–17.0)
Immature Granulocytes: 1 %
Lymphocytes Relative: 12 %
Lymphs Abs: 0.3 10*3/uL — ABNORMAL LOW (ref 0.7–4.0)
MCH: 26.6 pg (ref 26.0–34.0)
MCHC: 31.7 g/dL (ref 30.0–36.0)
MCV: 83.7 fL (ref 80.0–100.0)
Monocytes Absolute: 0.3 10*3/uL (ref 0.1–1.0)
Monocytes Relative: 10 %
Neutro Abs: 2.1 10*3/uL (ref 1.7–7.7)
Neutrophils Relative %: 77 %
Platelets: 49 10*3/uL — ABNORMAL LOW (ref 150–400)
RBC: 4.18 MIL/uL — ABNORMAL LOW (ref 4.22–5.81)
RDW: 16 % — ABNORMAL HIGH (ref 11.5–15.5)
WBC: 2.7 10*3/uL — ABNORMAL LOW (ref 4.0–10.5)
nRBC: 0 % (ref 0.0–0.2)

## 2019-04-24 LAB — GLUCOSE, CAPILLARY
Glucose-Capillary: 154 mg/dL — ABNORMAL HIGH (ref 70–99)
Glucose-Capillary: 172 mg/dL — ABNORMAL HIGH (ref 70–99)
Glucose-Capillary: 165 mg/dL — ABNORMAL HIGH (ref 70–99)
Glucose-Capillary: 157 mg/dL — ABNORMAL HIGH (ref 70–99)

## 2019-04-24 LAB — COMPREHENSIVE METABOLIC PANEL
ALT: 21 U/L (ref 0–44)
AST: 40 U/L (ref 15–41)
Albumin: 3.4 g/dL — ABNORMAL LOW (ref 3.5–5.0)
Alkaline Phosphatase: 80 U/L (ref 38–126)
Anion gap: 12 (ref 5–15)
BUN: 21 mg/dL — ABNORMAL HIGH (ref 6–20)
CO2: 30 mmol/L (ref 22–32)
Calcium: 8.9 mg/dL (ref 8.9–10.3)
Chloride: 95 mmol/L — ABNORMAL LOW (ref 98–111)
Creatinine, Ser: 0.83 mg/dL (ref 0.61–1.24)
GFR calc Af Amer: >60 mL/min (ref >60)
GFR calc non Af Amer: >60 mL/min (ref >60)
Glucose, Bld: 184 mg/dL — ABNORMAL HIGH (ref 70–99)
Potassium: 3.2 mmol/L — ABNORMAL LOW (ref 3.5–5.1)
Sodium: 137 mmol/L (ref 135–145)
Total Bilirubin: 1.1 mg/dL (ref 0.3–1.2)
Total Protein: 7.3 g/dL (ref 6.5–8.1)

## 2019-04-24 LAB — MAGNESIUM: Magnesium: 2.2 mg/dL (ref 1.7–2.4)

## 2019-04-24 MED ORDER — FENTANYL CITRATE (PF) 100 MCG/2ML IJ SOLN
25.0000 ug | INTRAMUSCULAR | Status: DC | PRN
  Administered 2019-04-24 – 2019-04-25 (×2): 75 ug via INTRAVENOUS
  Filled 2019-04-24 (×2): qty 2

## 2019-04-24 MED ORDER — POTASSIUM CHLORIDE CRYS ER 20 MEQ PO TBCR
50.0000 meq | EXTENDED_RELEASE_TABLET | Freq: Two times a day (BID) | ORAL | Status: DC
  Administered 2019-04-24 – 2019-04-25 (×3): 50 meq via ORAL
  Filled 2019-04-24 (×3): qty 1

## 2019-04-24 MED ORDER — LIVING WELL WITH DIABETES BOOK: Freq: Once | Status: AC

## 2019-04-24 MED ORDER — FUROSEMIDE 10 MG/ML IJ SOLN
60.0000 mg | Freq: Two times a day (BID) | INTRAMUSCULAR | Status: DC
  Administered 2019-04-24 – 2019-04-25 (×3): 60 mg via INTRAVENOUS
  Filled 2019-04-24 (×3): qty 6

## 2019-04-24 MED ORDER — HYDROMORPHONE HCL 2 MG PO TABS
4.0000 mg | ORAL_TABLET | Freq: Once | ORAL | Status: AC
  Administered 2019-04-24: 4 mg via ORAL
  Filled 2019-04-24: qty 2

## 2019-04-24 NOTE — Telephone Encounter (Signed)
Pt called to let Dr Dettinger know that he is currently in the hospital because he had a lot of fluid on him. Pt says he will probably be in the hospital for a few more days, but wanted to let Dr Dettinger know that the doctor at the hospital has him taking Lasix, and pt wants to know if Dr Dettinger will approve for him to take Lasix every so often instead of taking his current booster fluid Rx.

## 2019-04-24 NOTE — Plan of Care (Signed)

## 2019-04-24 NOTE — Progress Notes (Signed)
PROGRESS NOTE [facility]   ANIKETH HUBERTY  DXI:[id number]  DOB: [birth year]  DOA: 04/22/2019 PCP: Dettinger, Fransisca Kaufmann, MD   Brief Admission Hx: 54 y.o. morbidly obese Caucasian male with a known history of CHF, cirrhosis, COPD, depression, type diabetes mellitus, hypertension, obstructive sleep apnea and rheumatoid arthritis as well as gout with gouty arthritis, who presented to the emergency room with acute onset of worsening scrotal edema with difficulty sitting or walking secondarily and claims occurred over a 2 week period.   MDM/Assessment & Plan:   1. Anasarca - Pt responding favorably to IV lasix and we will continue as long as renal function tolerates.  He has diuresed nearly 5L since admission.  Follow closely.  With bump in BUN I have reduced lasix to 60 mg IV every 12 hours.  Follow renal function panel.  2. Gouty arthritis - s/p IV solumedrol and colchicine.  Symptoms reported as better.  3. Scrotal edema - Pt has a large left hydrocele and was seen by urology.  He will need to follow up with urology in office in 2 -3 weeks and Dr. Alyson Ingles will arrange for operative management.  Scrotal support ordered.  4. HTN -stable on home meds.  5. GAD - resumed home meds and alprazolam as needed.  6. Prediabetes - SSI coverage and CBG testing.  A1c 5.7%.  7. OSA - nightly CPAP ordered in hospital.  8. Pancytopenia - he is followed by Dr. Delton Coombes for this.    DVT prophylaxis: SCDs Code Status: Full  Family Communication: patient updated with plan of care at bedside, verbalized understanding Disposition Plan: continue IV lasix for diuresis    Consultants:  Urology   Procedures:    Antimicrobials:     Subjective: Pt reports he never received the scrotal support that urology had said they would arrange for him.  He reports he is able to ambulate a little better today.    Objective: Vitals:   04/24/19 0152 04/24/19 0441 04/24/19 0847 04/24/19 1300  BP:  136/80   134/82  Pulse:  67  71  Resp:  19  19  Temp:    98.2 F (36.8 C)  TempSrc:    Oral  SpO2:  94% 93% 95%  Weight: (!) 166.9 kg     Height:        Intake/Output Summary (Last 24 hours) at 04/24/2019 1556 Last data filed at 04/24/2019 1300 Gross per 24 hour  Intake 2146 ml  Output 2800 ml  Net -654 ml   Filed Weights   04/21/19 2332 04/24/19 0152  Weight: (!) 167.8 kg (!) 166.9 kg    REVIEW OF SYSTEMS  As per history otherwise all reviewed and reported negative  Exam:  General exam: morbidly obese male with anasarca, NAD.  Respiratory system: Clear. No increased work of breathing. Cardiovascular system: S1 & S2 heard. No JVD, murmurs, gallops, clicks, 2+ pitting pedal edema. Gastrointestinal system: Abdomen is nondistended, soft and nontender. Normal bowel sounds heard. GU: Massive scrotal edema mostly unchanged.  Central nervous system: Alert and oriented. No focal neurological deficits. Extremities: 2+ pitting edema BLEs.  Data Reviewed: Basic Metabolic Panel: Recent Labs  Lab 04/22/19 0210 04/23/19 0539 04/24/19 0620  NA 136 136 137  K 3.6 3.6 3.2*  CL 103 98 95*  CO2 24 27 30   GLUCOSE 101* 212* 184*  BUN 7 13 21*  CREATININE 0.71 0.81 0.83  CALCIUM 9.0 9.2 8.9  MG  --  2.0 2.2  Liver Function Tests: Recent Labs  Lab 04/22/19 0210 04/23/19 0539 04/24/19 0620  AST 65* 37 40  ALT 23 20 21   ALKPHOS 104 88 80  BILITOT 1.2 1.6* 1.1  PROT 7.5 7.4 7.3  ALBUMIN 3.5 3.4* 3.4*   No results for input(s): LIPASE, AMYLASE in the last 168 hours. No results for input(s): AMMONIA in the last 168 hours. CBC: Recent Labs  Lab 04/22/19 0210 04/23/19 0539 04/24/19 0620  WBC 2.2* 3.9* 2.7*  NEUTROABS 1.3* 3.3 2.1  HGB 11.8* 11.6* 11.1*  HCT 37.9* 36.9* 35.0*  MCV 85.0 84.6 83.7  PLT 47* 52* 49*   Cardiac Enzymes: No results for input(s): CKTOTAL, CKMB, CKMBINDEX, TROPONINI in the last 168 hours. CBG (last 3)  Recent Labs    04/23/19 2115 04/24/19 0813  04/24/19 1246  GLUCAP 211* 154* 172*   Recent Results (from the past 240 hour(s))  Respiratory Panel by RT PCR (Flu A&B, Covid) -     Status: None   Collection Time: 04/22/19  2:12 AM  Result Value Ref Range Status   SARS Coronavirus 2 by RT PCR NEGATIVE NEGATIVE Final    Comment: (NOTE) SARS-CoV-2 target nucleic acids are NOT DETECTED. The SARS-CoV-2 RNA is generally detectable in upper respiratoy specimens during the acute phase of infection. The lowest concentration of SARS-CoV-2 viral copies this assay can detect is 131 copies/mL. A negative result does not preclude SARS-Cov-2 infection and should not be used as the sole basis for treatment or other patient management decisions. A negative result may occur with  improper specimen collection/handling, submission of specimen other than nasopharyngeal swab, presence of viral mutation(s) within the areas targeted by this assay, and inadequate number of viral copies (<131 copies/mL). A negative result must be combined with clinical observations, patient history, and epidemiological information. The expected result is Negative. Fact Sheet for Patients:  PinkCheek.be Fact Sheet for Healthcare Providers:  GravelBags.it This test is not yet ap proved or cleared by the [place] FDA and  has been authorized for detection and/or diagnosis of SARS-CoV-2 by FDA under an Emergency Use Authorization (EUA). This EUA will remain  in effect (meaning this test can be used) for the duration of the COVID-19 declaration under Section 564(b)(1) of the Act, 21 U.S.C. section 360bbb-3(b)(1), unless the authorization is terminated or revoked sooner.    Influenza A by PCR NEGATIVE NEGATIVE Final   Influenza B by PCR NEGATIVE NEGATIVE Final    Comment: (NOTE) The Xpert Xpress SARS-CoV-2/FLU/RSV assay is intended as an aid in  the diagnosis of influenza from Nasopharyngeal swab specimens and   should not be used as a sole basis for treatment. Nasal washings and  aspirates are unacceptable for Xpert Xpress SARS-CoV-2/FLU/RSV  testing. Fact Sheet for Patients: PinkCheek.be Fact Sheet for Healthcare Providers: GravelBags.it This test is not yet approved or cleared by the [place] FDA and  has been authorized for detection and/or diagnosis of SARS-CoV-2 by  FDA under an Emergency Use Authorization (EUA). This EUA will remain  in effect (meaning this test can be used) for the duration of the  Covid-19 declaration under Section 564(b)(1) of the Act, 21  U.S.C. section 360bbb-3(b)(1), unless the authorization is  terminated or revoked. Performed at [facility], [place] [address] [place], [place] [address]      Studies: No results found. Scheduled Meds: . aspirin EC  81 mg Oral Daily  . busPIRone  10 mg Oral TID  . colchicine  0.6 mg  Oral BID  . diltiazem  300 mg Oral Daily  . DULoxetine  60 mg Oral QHS  . ezetimibe  10 mg Oral Daily  . folic acid  1 mg Oral Daily  . furosemide  60 mg Intravenous BID  . gabapentin  800 mg Oral TID  . insulin aspart  0-9 Units Subcutaneous TID PC & HS  . lactulose  20 g Oral BID  . metolazone  2.5 mg Oral Daily  . multivitamin with minerals  1 tablet Oral Daily  . pantoprazole  40 mg Oral Daily  . potassium chloride SA  50 mEq Oral BID  . pravastatin  10 mg Oral Daily  . propranolol  20 mg Oral Daily  . QUEtiapine  100 mg Oral QHS  . sodium chloride flush  3 mL Intravenous Q12H  . vitamin B-12  1,000 mcg Oral Daily   Continuous Infusions: . sodium chloride      Active Problems:   T2DM (type 2 diabetes mellitus) (HCC)   Gout   OSA (obstructive sleep apnea)   Coronary atherosclerosis   Hypertension associated with diabetes (HCC)   Atrial fibrillation (HCC)   DIASTOLIC HEART FAILURE, CHRONIC   Cirrhosis ([place])   Thrombocytopenia ([place])   Hyperlipidemia  associated with type 2 diabetes mellitus (HCC)   Bilateral primary osteoarthritis of knee   Scrotal edema   Acute on chronic diastolic heart failure (HCC)   Elevated AST (SGOT)   Pancytopenia ([place])   Bilateral hydrocele  Time spent:   Irwin Brakeman, MD [facility] 04/24/2019, 3:56 PM    LOS: 2 days  How to contact the [facility] Attending or Consulting provider [place] or covering provider during after hours [place], for this patient?  1. Check the care team in [facility] and look for a) attending/consulting TRH provider listed and b) the [facility] team listed 2. Log into [email or web address] and use [place]'s universal password to access. If you do not have the password, please contact the hospital operator. 3. Locate the [facility] provider you are looking for under [facility] and page to a number that you can be directly reached. 4. If you still have difficulty reaching the provider, please page the [facility] (Director on Call) for the Hospitalists listed on amion for assistance.

## 2019-04-24 NOTE — Care Management Important Message (Signed)
Important Message  Patient Details  Name: [name] MRN: [id number] Date of Birth: [birth year]   Medicare Important Message Given:  Yes     [name] 04/24/2019, 12:42 PM

## 2019-04-24 NOTE — Telephone Encounter (Signed)
Let him know that when he leaves the hospital they should give him a few of anything that they prescribed him and then he should follow up with me in the office an appointment soon after and we can discuss that then and also recheck his kidneys to make sure that is not taxing his kidneys.  But this should be discussed in appointment after he leaves the hospital please, make sure he schedules this

## 2019-04-25 DIAGNOSIS — E785 Hyperlipidemia, unspecified: Secondary | ICD-10-CM

## 2019-04-25 DIAGNOSIS — M1A9XX0 Chronic gout, unspecified, without tophus (tophi): Secondary | ICD-10-CM

## 2019-04-25 DIAGNOSIS — E1169 Type 2 diabetes mellitus with other specified complication: Secondary | ICD-10-CM

## 2019-04-25 LAB — COMPREHENSIVE METABOLIC PANEL
ALT: 29 U/L (ref 0–44)
AST: 56 U/L — ABNORMAL HIGH (ref 15–41)
Albumin: 3.7 g/dL (ref 3.5–5.0)
Alkaline Phosphatase: 85 U/L (ref 38–126)
Anion gap: 13 (ref 5–15)
BUN: 29 mg/dL — ABNORMAL HIGH (ref 6–20)
CO2: 32 mmol/L (ref 22–32)
Calcium: 8.8 mg/dL — ABNORMAL LOW (ref 8.9–10.3)
Chloride: 92 mmol/L — ABNORMAL LOW (ref 98–111)
Creatinine, Ser: 0.95 mg/dL (ref 0.61–1.24)
GFR calc Af Amer: >60 mL/min (ref >60)
GFR calc non Af Amer: >60 mL/min (ref >60)
Glucose, Bld: 145 mg/dL — ABNORMAL HIGH (ref 70–99)
Potassium: 3.2 mmol/L — ABNORMAL LOW (ref 3.5–5.1)
Sodium: 137 mmol/L (ref 135–145)
Total Bilirubin: 1.5 mg/dL — ABNORMAL HIGH (ref 0.3–1.2)
Total Protein: 7.6 g/dL (ref 6.5–8.1)

## 2019-04-25 LAB — CBC WITH DIFFERENTIAL/PLATELET
Abs Immature Granulocytes: 0.02 10*3/uL (ref 0.00–0.07)
Basophils Absolute: 0 10*3/uL (ref 0.0–0.1)
Basophils Relative: 0 %
Eosinophils Absolute: 0 10*3/uL (ref 0.0–0.5)
Eosinophils Relative: 1 %
HCT: 39.6 % (ref 39.0–52.0)
Hemoglobin: 12.6 g/dL — ABNORMAL LOW (ref 13.0–17.0)
Immature Granulocytes: 1 %
Lymphocytes Relative: 19 %
Lymphs Abs: 0.8 10*3/uL (ref 0.7–4.0)
MCH: 26.5 pg (ref 26.0–34.0)
MCHC: 31.8 g/dL (ref 30.0–36.0)
MCV: 83.4 fL (ref 80.0–100.0)
Monocytes Absolute: 0.5 10*3/uL (ref 0.1–1.0)
Monocytes Relative: 12 %
Neutro Abs: 2.9 10*3/uL (ref 1.7–7.7)
Neutrophils Relative %: 67 %
Platelets: 62 10*3/uL — ABNORMAL LOW (ref 150–400)
RBC: 4.75 MIL/uL (ref 4.22–5.81)
RDW: 15.8 % — ABNORMAL HIGH (ref 11.5–15.5)
WBC: 4.2 10*3/uL (ref 4.0–10.5)
nRBC: 0 % (ref 0.0–0.2)

## 2019-04-25 LAB — MAGNESIUM: Magnesium: 2.2 mg/dL (ref 1.7–2.4)

## 2019-04-25 LAB — GLUCOSE, CAPILLARY
Glucose-Capillary: 144 mg/dL — ABNORMAL HIGH (ref 70–99)
Glucose-Capillary: 128 mg/dL — ABNORMAL HIGH (ref 70–99)

## 2019-04-25 MED ORDER — METOLAZONE 2.5 MG PO TABS: 2.5000 mg | ORAL_TABLET | ORAL | 0 refills | Status: DC

## 2019-04-25 MED ORDER — POTASSIUM CHLORIDE CRYS ER 20 MEQ PO TBCR: 40.0000 meq | EXTENDED_RELEASE_TABLET | Freq: Two times a day (BID) | ORAL | 0 refills | Status: DC

## 2019-04-25 MED ORDER — TORSEMIDE 20 MG PO TABS: ORAL_TABLET | ORAL | 0 refills | Status: DC

## 2019-04-25 NOTE — Discharge Instructions (Signed)
Edema  Edema is when you have too much fluid in your body or under your skin. Edema may make your legs, feet, and ankles swell up. Swelling is also common in looser tissues, like around your eyes. This is a common condition. It gets more common as you get older. There are many possible causes of edema. Eating too much salt (sodium) and being on your feet or sitting for a long time can cause edema in your legs, feet, and ankles. Hot weather may make edema worse. Edema is usually painless. Your skin may look swollen or shiny. Follow these instructions at home:  Keep the swollen body part raised (elevated) above the level of your heart when you are sitting or lying down.  Do not sit still or stand for a long time.  Do not wear tight clothes. Do not wear garters on your upper legs.  Exercise your legs. This can help the swelling go down.  Wear elastic bandages or support stockings as told by your doctor.  Eat a low-salt (low-sodium) diet to reduce fluid as told by your doctor.  Depending on the cause of your swelling, you may need to limit how much fluid you drink (fluid restriction).  Take over-the-counter and prescription medicines only as told by your doctor. Contact a doctor if:  Treatment is not working.  You have heart, liver, or kidney disease and have symptoms of edema.  You have sudden and unexplained weight gain. Get help right away if:  You have shortness of breath or chest pain.  You cannot breathe when you lie down.  You have pain, redness, or warmth in the swollen areas.  You have heart, liver, or kidney disease and get edema all of a sudden.  You have a fever and your symptoms get worse all of a sudden. Summary  Edema is when you have too much fluid in your body or under your skin.  Edema may make your legs, feet, and ankles swell up. Swelling is also common in looser tissues, like around your eyes.  Raise (elevate) the swollen body part above the level of your  heart when you are sitting or lying down.  Follow your doctor's instructions about diet and how much fluid you can drink (fluid restriction). This information is not intended to replace advice given to you by your health care provider. Make sure you discuss any questions you have with your health care provider. Document Revised: 12/28/2016 Document Reviewed: 01/13/2016 Elsevier Patient Education  [place].   IMPORTANT INFORMATION: PAY CLOSE ATTENTION   PHYSICIAN DISCHARGE INSTRUCTIONS  Follow with Primary care provider  Dettinger, Fransisca Kaufmann, MD  and other consultants as instructed by your Hospitalist Physician  [place] IF SYMPTOMS COME BACK, WORSEN OR NEW PROBLEM DEVELOPS   Please note: You were cared for by a hospitalist during your hospital stay. Every effort will be made to forward records to your primary care provider.  You can request that your primary care provider send for your hospital records if they have not received them.  Once you are discharged, your primary care physician will handle any further medical issues. Please note that NO REFILLS for any discharge medications will be authorized once you are discharged, as it is imperative that you return to your primary care physician (or establish a relationship with a primary care physician if you do not have one) for your post hospital discharge needs so that they can reassess your need  for medications and monitor your lab values.  Please get a complete blood count and chemistry panel checked by your Primary MD at your next visit, and again as instructed by your Primary MD.  Get Medicines reviewed and adjusted: Please take all your medications with you for your next visit with your Primary MD  Laboratory/radiological data: Please request your Primary MD to go over all hospital tests and procedure/radiological results at the follow up, please ask your primary care provider to get all  Hospital records sent to his/her office.  In some cases, they will be blood work, cultures and biopsy results pending at the time of your discharge. Please request that your primary care provider follow up on these results.  If you are diabetic, please bring your blood sugar readings with you to your follow up appointment with primary care.    Please call and make your follow up appointments as soon as possible.    Also Note the following: If you experience worsening of your admission symptoms, develop shortness of breath, life threatening emergency, suicidal or homicidal thoughts you must seek medical attention immediately by calling 911 or calling your MD immediately  if symptoms less severe.  You must read complete instructions/literature along with all the possible adverse reactions/side effects for all the Medicines you take and that have been prescribed to you. Take any new Medicines after you have completely understood and accpet all the possible adverse reactions/side effects.   Do not drive when taking Pain medications or sleeping medications (Benzodiazepines)  Do not take more than prescribed Pain, Sleep and Anxiety Medications. It is not advisable to combine anxiety,sleep and pain medications without talking with your primary care practitioner  Special Instructions: If you have smoked or chewed Tobacco  in the last 2 yrs please stop smoking, stop any regular Alcohol  and or any Recreational drug use.  Wear Seat belts while driving.  Do not drive if taking any narcotic, mind altering or controlled substances or recreational drugs or alcohol.

## 2019-04-25 NOTE — Discharge Summary (Signed)
Physician Discharge Summary  [name] ZSW:[id number] DOB: Mar 04, 1965 DOA: 04/22/2019  PCP: Dettinger, Fransisca Kaufmann, MD Urology: [facility]  Admit date: 04/22/2019 Discharge date: 04/25/2019  Admitted From:  Home  Disposition: Home   Pt discharging now due to reported "family emergency"  Recommendations for Outpatient Follow-up:  1. Follow up with PCP in 1 weeks 2. Follow up with urologist in 2 weeks 3. Please obtain BMP in 1-2 weeks to follow electrolytes  Discharge Condition: STABLE   CODE STATUS: FULL    Brief Hospitalization Summary: Please see all hospital notes, images, labs for full details of the hospitalization. Admission HPI:  [name]  is a 54 y.o. morbidly obese Caucasian male with a known history of CHF, cirrhosis, COPD, depression, type diabetes mellitus, hypertension, obstructive sleep apnea and rheumatoid arthritis as well as gout with gouty arthritis, who presented to the emergency room with acute onset of worsening scrotal edema with difficulty sitting or walking secondarily.  He has a left Periumbilical and umbilical hernia which has been reproducible.  He admits to paroxysmal nocturnal dyspnea and palpitations without chest pain.  No significantly worsening lower extremity edema or significant orthopnea.  No nausea or vomiting or abdominal pain.  No significant cough or wheezing.  He uses a CPAP nightly.  No dysuria, oliguria or hematuria or flank pain.  He complains of left wrist pain that feels like his gouty arthritis pain.  Upon presentation to the emergency room, blood pressure was 130/82 with a heart rate of 108 and respirate of 22 with pulse currently 100% on room air.  Labs revealed unremarkable CMP with albumin of 3.5 and total protein of 7.5 and BNP of 114.  CBC showed leukopenia of 2.2 better than his previous levels last month and anemia better than his previous levels.  COVID-19 PCR is currently pending.  Chest x-ray showed chronic central vascular  congestion without airspace disease or effusion.  The patient was given 80 mg of IV Lasix and 80 mg of IV Solu-Medrol.  He will be admitted to a telemetry bed for further evaluation and management.    Brief Admission Hx: 54 y.o.morbidly obese Caucasian malewith a known history of CHF, cirrhosis, COPD, depression, type diabetes mellitus, hypertension, obstructive sleep apnea and rheumatoid arthritis as well as gout with gouty arthritis, who presented to the emergency roomwith acute onset of worsening scrotal edema with difficulty sitting or walking secondarily and claims occurred over a 2 week period.   MDM/Assessment & Plan:   1. Anasarca - Pt responding favorably to IV lasix and we will continue as long as renal function tolerates.  He has diuresed nearly 5L since admission.  Follow closely.  With bump in BUN I reduced lasix to 60 mg IV every 12 hours.  renal function stable.  Creatinine stable. Pt will discharge home on torsemide 60 mg BID with potassium supplement and metolazone 2.5 mg three times weekly.  Follow up with PCP in 1 week to have follow up BMP done.   2. Gouty arthritis - s/p IV solumedrol and colchicine.  Symptoms reported as better.  3. Scrotal edema - Pt has a large left hydrocele and was seen by urology.  He will need to follow up with urology in office in 2 -3 weeks and Dr. Alyson Ingles will arrange for operative management.  Scrotal support ordered.  4. HTN -stable on home meds.  5. GAD - resumed home meds and alprazolam as needed.  6. Prediabetes - SSI coverage and CBG testing.  A1c  5.7%.  7. OSA - nightly CPAP ordered in hospital.  8. Pancytopenia - he is followed by Dr. Delton Coombes for this.    DVT prophylaxis: SCDs Code Status: Full  Family Communication: patient updated with plan of care at bedside, verbalized understanding Disposition Plan: Pt is saying he has a "family emergency" and needs to discharge now.  I had planned to continue IV diuresis but he is adamant  that he must leave now.  I advised close outpatient follow up with PCP and urologist.      Consultants:  Urology   Procedures:    Antimicrobials:     Discharge Diagnoses:  Active Problems:   T2DM (type 2 diabetes mellitus) (HCC)   Gout   OSA (obstructive sleep apnea)   Coronary atherosclerosis   Hypertension associated with diabetes (HCC)   Atrial fibrillation (HCC)   DIASTOLIC HEART FAILURE, CHRONIC   Cirrhosis ([place])   Thrombocytopenia (HCC)   Hyperlipidemia associated with type 2 diabetes mellitus (HCC)   Bilateral primary osteoarthritis of knee   Scrotal edema   Acute on chronic diastolic heart failure (HCC)   Elevated AST (SGOT)   Pancytopenia (HCC)   Bilateral hydrocele   Discharge Instructions:  Allergies as of 04/25/2019      Reactions   Allopurinol Other (See Comments)   Joint pain worse    Oxycodone Base Itching   Prozac [fluoxetine Hcl] Itching      Medication List    STOP taking these medications   indomethacin 50 MG capsule Commonly known as: INDOCIN   ketorolac 10 MG tablet Commonly known as: TORADOL   milk thistle 175 MG tablet     TAKE these medications   aspirin EC 81 MG tablet Take 81 mg by mouth every 6 (six) hours as needed (CHEST PAIN).   baclofen 10 MG tablet Commonly known as: LIORESAL TAKE 1 TABLET BY MOUTH THREE TIMES DAILY What changed:   when to take this  reasons to take this   betamethasone dipropionate 0.05 % cream APPLY TO AFFECTED AREA TWICE A DAY   busPIRone 10 MG tablet Commonly known as: BUSPAR Take 1 tablet (10 mg total) by mouth 3 (three) times daily.   celecoxib 100 MG capsule Commonly known as: CeleBREX Take 1 capsule (100 mg total) by mouth 2 (two) times daily.   Constulose 10 GM/15ML solution Generic drug: lactulose take TWO tablespoonsful (30 mls) BY MOUTH TWICE DAILY   diltiazem 300 MG 24 hr capsule Commonly known as: Cartia XT TAKE ONE CAPSULE BY MOUTH EVERY DAY   diltiazem 90 MG  12 hr capsule Commonly known as: CARDIZEM SR Take 1 capsule (90 mg total) by mouth daily as needed (palpitations).   DULoxetine 60 MG capsule Commonly known as: CYMBALTA Take 60 mg by mouth at bedtime.   esomeprazole 40 MG capsule Commonly known as: NEXIUM Take 1 capsule (40 mg total) by mouth daily.   ezetimibe 10 MG tablet Commonly known as: ZETIA TAKE 1 TABLET BY MOUTH EVERY DAY   fluticasone 50 MCG/ACT nasal spray Commonly known as: FLONASE USE TWO SPRAYS IN EACH NOSTRIL EVERY DAY AS NEEDED   folic acid 1 MG tablet Commonly known as: FOLVITE TAKE ONE TABLET BY MOUTH ONCE DAILY - EMERGENCY REFILL FAXED DR.   gabapentin 800 MG tablet Commonly known as: NEURONTIN Take 1 tablet (800 mg total) by mouth 3 (three) times daily.   HYDROmorphone 4 MG tablet Commonly known as: DILAUDID TAKE 1 TABLET BY MOUTH EVERY DAY AS NEEDED FOR  PAIN   metolazone 2.5 MG tablet Commonly known as: ZAROXOLYN Take 1 tablet (2.5 mg total) by mouth 3 (three) times a week. Start taking on: April 27, 2019 What changed: See the new instructions.   MULTI COMPLETE PO Take 1 tablet by mouth daily.   Muscle Rub 10-15 % Crea Apply 1 application topically 3 (three) times daily as needed for muscle pain. For muscle pain   neomycin-bacitracin-polymyxin ointment Commonly known as: NEOSPORIN Apply 1 application topically every 12 (twelve) hours as needed for wound care. Reported on 06/01/2015   potassium chloride SA 20 MEQ tablet Commonly known as: KLOR-CON Take 2 tablets (40 mEq total) by mouth 2 (two) times daily. What changed: how much to take   pravastatin 10 MG tablet Commonly known as: PRAVACHOL Take 1 tablet (10 mg total) by mouth daily.   propranolol 20 MG tablet Commonly known as: INDERAL TAKE 1 TABLET BY MOUTH DAILY   QUEtiapine 100 MG tablet Commonly known as: SEROquel Take 1 tablet (100 mg total) by mouth at bedtime.   torsemide 20 MG tablet Commonly known as: DEMADEX Take 3  tabs twice daily Start taking on: April 26, 2019 What changed: See the new instructions.      Follow-up Information    Dettinger, Fransisca Kaufmann, MD. Schedule an appointment as soon as possible for a visit in 1 week(s).   Specialties: Family Medicine, Cardiology Contact information: [place] [address] [phone]        Cleon Gustin, MD. Schedule an appointment as soon as possible for a visit in 2 week(s).   Specialty: Urology Why: Hospital Follow Up  Contact information: [address] [place] [address] [phone]          Allergies  Allergen Reactions  . Allopurinol Other (See Comments)    Joint pain worse   . Oxycodone Base Itching  . Prozac [Fluoxetine Hcl] Itching   Allergies as of 04/25/2019      Reactions   Allopurinol Other (See Comments)   Joint pain worse    Oxycodone Base Itching   Prozac [fluoxetine Hcl] Itching      Medication List    STOP taking these medications   indomethacin 50 MG capsule Commonly known as: INDOCIN   ketorolac 10 MG tablet Commonly known as: TORADOL   milk thistle 175 MG tablet     TAKE these medications   aspirin EC 81 MG tablet Take 81 mg by mouth every 6 (six) hours as needed (CHEST PAIN).   baclofen 10 MG tablet Commonly known as: LIORESAL TAKE 1 TABLET BY MOUTH THREE TIMES DAILY What changed:   when to take this  reasons to take this   betamethasone dipropionate 0.05 % cream APPLY TO AFFECTED AREA TWICE A DAY   busPIRone 10 MG tablet Commonly known as: BUSPAR Take 1 tablet (10 mg total) by mouth 3 (three) times daily.   celecoxib 100 MG capsule Commonly known as: CeleBREX Take 1 capsule (100 mg total) by mouth 2 (two) times daily.   Constulose 10 GM/15ML solution Generic drug: lactulose take TWO tablespoonsful (30 mls) BY MOUTH TWICE DAILY   diltiazem 300 MG 24 hr capsule Commonly known as: Cartia XT TAKE ONE CAPSULE BY MOUTH EVERY DAY   diltiazem 90 MG 12 hr  capsule Commonly known as: CARDIZEM SR Take 1 capsule (90 mg total) by mouth daily as needed (palpitations).   DULoxetine 60 MG capsule Commonly known as: CYMBALTA Take 60  mg by mouth at bedtime.   esomeprazole 40 MG capsule Commonly known as: NEXIUM Take 1 capsule (40 mg total) by mouth daily.   ezetimibe 10 MG tablet Commonly known as: ZETIA TAKE 1 TABLET BY MOUTH EVERY DAY   fluticasone 50 MCG/ACT nasal spray Commonly known as: FLONASE USE TWO SPRAYS IN EACH NOSTRIL EVERY DAY AS NEEDED   folic acid 1 MG tablet Commonly known as: FOLVITE TAKE ONE TABLET BY MOUTH ONCE DAILY - EMERGENCY REFILL FAXED DR.   gabapentin 800 MG tablet Commonly known as: NEURONTIN Take 1 tablet (800 mg total) by mouth 3 (three) times daily.   HYDROmorphone 4 MG tablet Commonly known as: DILAUDID TAKE 1 TABLET BY MOUTH EVERY DAY AS NEEDED FOR PAIN   metolazone 2.5 MG tablet Commonly known as: ZAROXOLYN Take 1 tablet (2.5 mg total) by mouth 3 (three) times a week. Start taking on: April 27, 2019 What changed: See the new instructions.   MULTI COMPLETE PO Take 1 tablet by mouth daily.   Muscle Rub 10-15 % Crea Apply 1 application topically 3 (three) times daily as needed for muscle pain. For muscle pain   neomycin-bacitracin-polymyxin ointment Commonly known as: NEOSPORIN Apply 1 application topically every 12 (twelve) hours as needed for wound care. Reported on 06/01/2015   potassium chloride SA 20 MEQ tablet Commonly known as: KLOR-CON Take 2 tablets (40 mEq total) by mouth 2 (two) times daily. What changed: how much to take   pravastatin 10 MG tablet Commonly known as: PRAVACHOL Take 1 tablet (10 mg total) by mouth daily.   propranolol 20 MG tablet Commonly known as: INDERAL TAKE 1 TABLET BY MOUTH DAILY   QUEtiapine 100 MG tablet Commonly known as: SEROquel Take 1 tablet (100 mg total) by mouth at bedtime.   torsemide 20 MG tablet Commonly known as: DEMADEX Take 3 tabs  twice daily Start taking on: April 26, 2019 What changed: See the new instructions.       Procedures/Studies: DG Chest Port 1 View  Result Date: 04/22/2019 CLINICAL DATA:  Testicular swelling, history of CHF and COPD EXAM: PORTABLE CHEST 1 VIEW COMPARISON:  04/13/2019 FINDINGS: Single frontal view of the chest demonstrates a stable cardiac silhouette. Chronic central vascular congestion without airspace disease, effusion, or pneumothorax. IMPRESSION: 1. Stable exam, no acute process. Electronically Signed   By: Randa Ngo M.D.   On: 04/22/2019 02:21   ECHOCARDIOGRAM COMPLETE  Result Date: 04/22/2019    ECHOCARDIOGRAM REPORT   Patient Name:   [name] Date of Exam: 04/22/2019 Medical Rec #:  [id number]        Height:       71.5 in Accession #:    [id number]       Weight:       370.0 lb Date of Birth:  01/23/1965        BSA:          2.753 m Patient Age:    51 years         BP:           92/69 mmHg Patient Gender: M                HR:           106 bpm. Exam Location:  [facility] Procedure: 2D Echo Indications:    CHF-Acute Diastolic 614.43 / X54.00  History:        Patient has prior history of Echocardiogram examinations, most  recent 02/13/2012. Arrythmias:Atrial Fibrillation; Risk                 Factors:Diabetes, Dyslipidemia, Hypertension and Non-Smoker.                 Scrotal edema.  Sonographer:    Leavy Cella RDCS (AE) Referring Phys: [id number] JAN A [place]  1. Left ventricular ejection fraction, by estimation, is 55 to 60%. The left ventricle has normal function. The left ventricle has no regional wall motion abnormalities. There is mild left ventricular hypertrophy. Left ventricular diastolic parameters are indeterminate.  2. Right ventricular systolic function is normal. The right ventricular size is normal. Tricuspid regurgitation signal is inadequate for assessing PA pressure.  3. Left atrial size was mildly dilated.  4. The mitral valve is grossly  normal. Trivial mitral valve regurgitation.  5. The aortic valve is tricuspid. Aortic valve regurgitation is not visualized.  6. The inferior vena cava is normal in size with greater than 50% respiratory variability, suggesting right atrial pressure of 3 mmHg. FINDINGS  Left Ventricle: Left ventricular ejection fraction, by estimation, is 55 to 60%. The left ventricle has normal function. The left ventricle has no regional wall motion abnormalities. Definity contrast agent was given IV to delineate the left ventricular  endocardial borders. The left ventricular internal cavity size was normal in size. There is mild left ventricular hypertrophy. Left ventricular diastolic parameters are indeterminate. Right Ventricle: The right ventricular size is normal. No increase in right ventricular wall thickness. Right ventricular systolic function is normal. Tricuspid regurgitation signal is inadequate for assessing PA pressure. Left Atrium: Left atrial size was mildly dilated. Right Atrium: Right atrial size was normal in size. Pericardium: There is no evidence of pericardial effusion. Presence of pericardial fat pad. Mitral Valve: The mitral valve is grossly normal. Trivial mitral valve regurgitation. Tricuspid Valve: The tricuspid valve is grossly normal. Tricuspid valve regurgitation is trivial. Aortic Valve: The aortic valve is tricuspid. Aortic valve regurgitation is not visualized. Mild aortic valve annular calcification. Pulmonic Valve: The pulmonic valve was grossly normal. Pulmonic valve regurgitation is trivial. Aorta: The aortic root is normal in size and structure. Venous: The inferior vena cava is normal in size with greater than 50% respiratory variability, suggesting right atrial pressure of 3 mmHg. IAS/Shunts: No atrial level shunt detected by color flow Doppler.  LEFT VENTRICLE PLAX 2D LVIDd:         5.08 cm  Diastology LVIDs:         4.06 cm  LV e' lateral:   13.10 cm/s LV PW:         1.45 cm  LV E/e'  lateral: 6.7 LV IVS:        1.29 cm  LV e' medial:    10.00 cm/s LVOT diam:     2.30 cm  LV E/e' medial:  8.8 LVOT Area:     4.15 cm  RIGHT VENTRICLE RV S prime:     16.80 cm/s TAPSE (M-mode): 1.8 cm LEFT ATRIUM             Index LA diam:        4.60 cm 1.67 cm/m LA Vol (A2C):   68.8 ml 24.99 ml/m LA Vol (A4C):   97.3 ml 35.34 ml/m LA Biplane Vol: 84.0 ml 30.51 ml/m   AORTA Ao Root diam: 3.35 cm MITRAL VALVE MV Area (PHT): 3.05 cm    SHUNTS MV Decel Time: 249 msec    Systemic Diam: 2.30 cm MV  E velocity: 87.80 cm/s MV A velocity: 32.60 cm/s MV E/A ratio:  2.69 Rozann Lesches MD Electronically signed by Rozann Lesches MD Signature Date/Time: 04/22/2019/3:38:09 PM    Final    US SCROTUM DOPPLER  Result Date: 04/22/2019 CLINICAL DATA:  Scrotal edema for 2 weeks EXAM: SCROTAL ULTRASOUND DOPPLER ULTRASOUND OF THE TESTICLES TECHNIQUE: Complete ultrasound examination of the testicles, epididymis, and other scrotal structures was performed. Color and spectral Doppler ultrasound were also utilized to evaluate blood flow to the testicles. COMPARISON:  None. FINDINGS: Right testicle Measurements: 3.7 x 2.2 x 2.7 cm. Homogeneous echotexture. No mass lesion. Left testicle Measurements: 3.8 x 3.0 x 2.6 cm. Homogeneous echotexture. No mass lesion. Right epididymis:  Normal in size and appearance. Left epididymis:  Normal in size and appearance. Hydrocele: Bilateral hydroceles which are small to moderate. LEFT hydrocele larger than RIGHT.None visualized. Varicocele:  None visualized Pulsed Doppler interrogation of both testes demonstrates normal low resistance arterial and venous waveforms bilaterally. Other: Scrotal edema. IMPRESSION: 1. Normal testicles with normal color Doppler flow. 2. Bilateral mild-to-moderate hydroceles larger on the LEFT. 3. Scrotal wall edema. Electronically Signed   By: Suzy Bouchard M.D.   On: 04/22/2019 12:23      Subjective: Pt says he feels much better, he is ambulating better and  noticed a huge improvement.  Pt says he has a family emergency and has to discharge home today.  He is not able to remain in hospital for further treatments.   Discharge Exam: Vitals:   04/25/19 0355 04/25/19 0849  BP: (!) 148/116   Pulse: 88   Resp: 12   Temp: 97.9 F (36.6 C)   SpO2: 94% 97%   Vitals:   04/24/19 2052 04/25/19 0100 04/25/19 0355 04/25/19 0849  BP:   (!) 148/116   Pulse: 88  88   Resp: 16  12   Temp:   97.9 F (36.6 C)   TempSrc:   Oral   SpO2:   94% 97%  Weight:  (!) 163.6 kg    Height:       General exam: morbidly obese male, NAD.  Respiratory system: Clear. No increased work of breathing. Cardiovascular system: S1 & S2 heard. No JVD, murmurs, gallops, clicks, 2+ pitting pedal edema. Gastrointestinal system: Abdomen is nondistended, soft and nontender. Normal bowel sounds heard. GU: Massive scrotal edema improved from admission.  Central nervous system: Alert and oriented. No focal neurological deficits. Extremities: 1+ pitting edema BLEs.   The results of significant diagnostics from this hospitalization (including imaging, microbiology, ancillary and laboratory) are listed below for reference.     Microbiology: Recent Results (from the past 240 hour(s))  Respiratory Panel by RT PCR (Flu A&B, Covid) -     Status: None   Collection Time: 04/22/19  2:12 AM  Result Value Ref Range Status   SARS Coronavirus 2 by RT PCR NEGATIVE NEGATIVE Final    Comment: (NOTE) SARS-CoV-2 target nucleic acids are NOT DETECTED. The SARS-CoV-2 RNA is generally detectable in upper respiratoy specimens during the acute phase of infection. The lowest concentration of SARS-CoV-2 viral copies this assay can detect is 131 copies/mL. A negative result does not preclude SARS-Cov-2 infection and should not be used as the sole basis for treatment or other patient management decisions. A negative result may occur with  improper specimen collection/handling, submission of specimen  other than nasopharyngeal swab, presence of viral mutation(s) within the areas targeted by this assay, and inadequate number of viral copies (<131 copies/mL). A  negative result must be combined with clinical observations, patient history, and epidemiological information. The expected result is Negative. Fact Sheet for Patients:  PinkCheek.be Fact Sheet for Healthcare Providers:  GravelBags.it This test is not yet ap proved or cleared by the [place] FDA and  has been authorized for detection and/or diagnosis of SARS-CoV-2 by FDA under an Emergency Use Authorization (EUA). This EUA will remain  in effect (meaning this test can be used) for the duration of the COVID-19 declaration under Section 564(b)(1) of the Act, 21 U.S.C. section 360bbb-3(b)(1), unless the authorization is terminated or revoked sooner.    Influenza A by PCR NEGATIVE NEGATIVE Final   Influenza B by PCR NEGATIVE NEGATIVE Final    Comment: (NOTE) The Xpert Xpress SARS-CoV-2/FLU/RSV assay is intended as an aid in  the diagnosis of influenza from Nasopharyngeal swab specimens and  should not be used as a sole basis for treatment. Nasal washings and  aspirates are unacceptable for Xpert Xpress SARS-CoV-2/FLU/RSV  testing. Fact Sheet for Patients: PinkCheek.be Fact Sheet for Healthcare Providers: GravelBags.it This test is not yet approved or cleared by the [place] FDA and  has been authorized for detection and/or diagnosis of SARS-CoV-2 by  FDA under an Emergency Use Authorization (EUA). This EUA will remain  in effect (meaning this test can be used) for the duration of the  Covid-19 declaration under Section 564(b)(1) of the Act, 21  U.S.C. section 360bbb-3(b)(1), unless the authorization is  terminated or revoked. Performed at [facility], [place] [address] [place], [place] [address]       Labs: BNP (last 3 results) Recent Labs    04/22/19 0210  BNP 283.6*   Basic Metabolic Panel: Recent Labs  Lab 04/22/19 0210 04/23/19 0539 04/24/19 0620 04/25/19 0724  NA 136 136 137 137  K 3.6 3.6 3.2* 3.2*  CL 103 98 95* 92*  CO2 24 27 30  32  GLUCOSE 101* 212* 184* 145*  BUN 7 13 21* 29*  CREATININE 0.71 0.81 0.83 0.95  CALCIUM 9.0 9.2 8.9 8.8*  MG  --  2.0 2.2 2.2   Liver Function Tests: Recent Labs  Lab 04/22/19 0210 04/23/19 0539 04/24/19 0620 04/25/19 0724  AST 65* 37 40 56*  ALT 23 20 21 29   ALKPHOS 104 88 80 85  BILITOT 1.2 1.6* 1.1 1.5*  PROT 7.5 7.4 7.3 7.6  ALBUMIN 3.5 3.4* 3.4* 3.7   No results for input(s): LIPASE, AMYLASE in the last 168 hours. No results for input(s): AMMONIA in the last 168 hours. CBC: Recent Labs  Lab 04/22/19 0210 04/23/19 0539 04/24/19 0620 04/25/19 0724  WBC 2.2* 3.9* 2.7* 4.2  NEUTROABS 1.3* 3.3 2.1 2.9  HGB 11.8* 11.6* 11.1* 12.6*  HCT 37.9* 36.9* 35.0* 39.6  MCV 85.0 84.6 83.7 83.4  PLT 47* 52* 49* 62*   Cardiac Enzymes: No results for input(s): CKTOTAL, CKMB, CKMBINDEX, TROPONINI in the last 168 hours. BNP: Invalid input(s): POCBNP CBG: Recent Labs  Lab 04/24/19 0813 04/24/19 1246 04/24/19 1655 04/24/19 2037 04/25/19 0734  GLUCAP 154* 172* 165* 157* 144*   D-Dimer No results for input(s): DDIMER in the last 72 hours. Hgb A1c No results for input(s): HGBA1C in the last 72 hours. Lipid Profile No results for input(s): CHOL, HDL, LDLCALC, TRIG, CHOLHDL, LDLDIRECT in the last 72 hours. Thyroid function studies No results for input(s): TSH, T4TOTAL, T3FREE, THYROIDAB in the last 72 hours.  Invalid input(s): FREET3 Anemia work up No results for input(s): VITAMINB12,  FOLATE, FERRITIN, TIBC, IRON, RETICCTPCT in the last 72 hours. Urinalysis    Component Value Date/Time   APPEARANCEUR Clear 09/29/2018 1020   GLUCOSEU Negative 09/29/2018 1020   BILIRUBINUR Negative 09/29/2018 1020   PROTEINUR  Negative 09/29/2018 1020   NITRITE Negative 09/29/2018 1020   LEUKOCYTESUR Negative 09/29/2018 1020   Sepsis Labs Invalid input(s): PROCALCITONIN,  WBC,  LACTICIDVEN Microbiology Recent Results (from the past 240 hour(s))  Respiratory Panel by RT PCR (Flu A&B, Covid) -     Status: None   Collection Time: 04/22/19  2:12 AM  Result Value Ref Range Status   SARS Coronavirus 2 by RT PCR NEGATIVE NEGATIVE Final    Comment: (NOTE) SARS-CoV-2 target nucleic acids are NOT DETECTED. The SARS-CoV-2 RNA is generally detectable in upper respiratoy specimens during the acute phase of infection. The lowest concentration of SARS-CoV-2 viral copies this assay can detect is 131 copies/mL. A negative result does not preclude SARS-Cov-2 infection and should not be used as the sole basis for treatment or other patient management decisions. A negative result may occur with  improper specimen collection/handling, submission of specimen other than nasopharyngeal swab, presence of viral mutation(s) within the areas targeted by this assay, and inadequate number of viral copies (<131 copies/mL). A negative result must be combined with clinical observations, patient history, and epidemiological information. The expected result is Negative. Fact Sheet for Patients:  PinkCheek.be Fact Sheet for Healthcare Providers:  GravelBags.it This test is not yet ap proved or cleared by the [place] FDA and  has been authorized for detection and/or diagnosis of SARS-CoV-2 by FDA under an Emergency Use Authorization (EUA). This EUA will remain  in effect (meaning this test can be used) for the duration of the COVID-19 declaration under Section 564(b)(1) of the Act, 21 U.S.C. section 360bbb-3(b)(1), unless the authorization is terminated or revoked sooner.    Influenza A by PCR NEGATIVE NEGATIVE Final   Influenza B by PCR NEGATIVE NEGATIVE Final    Comment:  (NOTE) The Xpert Xpress SARS-CoV-2/FLU/RSV assay is intended as an aid in  the diagnosis of influenza from Nasopharyngeal swab specimens and  should not be used as a sole basis for treatment. Nasal washings and  aspirates are unacceptable for Xpert Xpress SARS-CoV-2/FLU/RSV  testing. Fact Sheet for Patients: PinkCheek.be Fact Sheet for Healthcare Providers: GravelBags.it This test is not yet approved or cleared by the [place] FDA and  has been authorized for detection and/or diagnosis of SARS-CoV-2 by  FDA under an Emergency Use Authorization (EUA). This EUA will remain  in effect (meaning this test can be used) for the duration of the  Covid-19 declaration under Section 564(b)(1) of the Act, 21  U.S.C. section 360bbb-3(b)(1), unless the authorization is  terminated or revoked. Performed at [facility], [place] [address] [place], [place] [address]     Time coordinating discharge: 35 mins  SIGNED:  Irwin Brakeman, MD  [facility] 04/25/2019, 10:29 AM How to contact the [facility] Attending or Consulting provider [place] or covering provider during after hours [place], for this patient?  1. Check the care team in [facility] and look for a) attending/consulting TRH provider listed and b) the [facility] team listed 2. Log into [email or web address] and use [place]'s universal password to access. If you do not have the password, please contact the hospital operator. 3. Locate the [facility] provider you are looking for under [facility] and page to a number that you can be directly reached. 4.  If you still have difficulty reaching the provider, please page the [facility] (Director on Call) for the Hospitalists listed on amion for assistance.

## 2019-04-27 ENCOUNTER — Telehealth: Payer: Self-pay | Admitting: *Deleted

## 2019-04-27 NOTE — Telephone Encounter (Signed)
    Transitional Care Management  Contact Attempt Attempt Date:04/27/2019 Attempted By: Cleda Clarks Altovise Wahler,LPN   2nd unsuccessful TCM contact attempt.   I reached out to [place] on his preferred telephone number to discuss Transitional Care Management, medication reconciliation, and to schedule a TCM hospital follow-up with his PCP at [facility].  Discharge Date: 04/25/19 Location: [facility]  Discharge Dx: acute on chronic diastolic ( congestive) heart failure  Recommendations for Outpatient Follow-up:   Recommendations for Outpatient Follow-up:  1. Follow up with PCP in 1 weeks 2. Follow up with urologist in 2 weeks 3. Please obtain BMP in 1-2 weeks to follow electrolytes  Plan I left a HIPPA compliant message for him to return my call.  Will attempt to contact again within the 2 business day post discharge window if he does not return my call.

## 2019-04-27 NOTE — Telephone Encounter (Signed)
TRANSITIONAL CARE MANAGEMENT TELEPHONE OUTREACH NOTE   Contact Date: 04/27/2019 Contacted By: Felicity Coyer, LPN   DISCHARGE INFORMATION Date of Discharge:  04/25/2019 Discharge Facility: [facility] Principal Discharge Diagnosis:  Acute on chronic diastolic (congestive) heart failure  Outpatient Follow Up Recommendations (copied from discharge summary) Recommendations for Outpatient Follow-up:  1. Follow up with PCP in 1 weeks 2. Follow up with urologist in 2 weeks 3. Please obtain BMP in 1-2 weeks to follow electrolytes  Makar Slatter Meisinger is a male primary care patient of Dettinger, Fransisca Kaufmann, MD. An outgoing telephone call was made today and I spoke with patient.  Mr. Swopes condition(s) and treatment(s) were discussed. An opportunity to ask questions was provided and all were answered or forwarded as appropriate.    ACTIVITIES OF DAILY LIVING  Paymon D Brisky lives with their family and he can perform ADLs independently. his primary caregiver is himself. he is able to depend on his primary caregiver(s) for consistent help. Transportation to appointments, to pick up medications, and to run errands is not a problem.  (Consider referral to [facility] if transportation or a consistent caregiver is a problem)   Fall Risk Fall Risk  02/17/2018 09/11/2017  Falls in the past year? 0 No  Number falls in past yr: - -  Injury with Fall? - -  Risk for fall due to : - -    medium [place] Modifications/Assistive Devices Wheelchair: No Cane: No Ramp: No Bedside Toilet: No Hospital Bed:  No Other: crutches   [place] he is not receiving home health NA services.     MEDICATION RECONCILIATION  Mr. Vazquez has been able to pick-up all prescribed discharge medications from the pharmacy.   A post discharge medication reconciliation was performed and the complete medication list was reviewed with the patient/caregiver and is current as of 04/27/2019. Changes highlighted  below.  Discontinued Medications indomethacin 50 MG capsule Commonly known as: INDOCIN   ketorolac 10 MG tablet Commonly known as: TORADOL   milk thistle 175 MG tablet     Current Medication List Allergies as of 04/27/2019      Reactions   Allopurinol Other (See Comments)   Joint pain worse    Oxycodone Base Itching   Prozac [fluoxetine Hcl] Itching      Medication List       Accurate as of April 27, 2019  1:35 PM. If you have any questions, ask your nurse or doctor.        aspirin EC 81 MG tablet Take 81 mg by mouth every 6 (six) hours as needed (CHEST PAIN).   baclofen 10 MG tablet Commonly known as: LIORESAL TAKE 1 TABLET BY MOUTH THREE TIMES DAILY What changed:   when to take this  reasons to take this   betamethasone dipropionate 0.05 % cream APPLY TO AFFECTED AREA TWICE A DAY   busPIRone 10 MG tablet Commonly known as: BUSPAR Take 1 tablet (10 mg total) by mouth 3 (three) times daily.   celecoxib 100 MG capsule Commonly known as: CeleBREX Take 1 capsule (100 mg total) by mouth 2 (two) times daily.   Constulose 10 GM/15ML solution Generic drug: lactulose take TWO tablespoonsful (30 mls) BY MOUTH TWICE DAILY   diltiazem 300 MG 24 hr capsule Commonly known as: Cartia XT TAKE ONE CAPSULE BY MOUTH EVERY DAY   diltiazem 90 MG 12 hr capsule Commonly known as: CARDIZEM SR Take 1 capsule (90 mg total) by  mouth daily as needed (palpitations).   DULoxetine 60 MG capsule Commonly known as: CYMBALTA Take 60 mg by mouth at bedtime.   esomeprazole 40 MG capsule Commonly known as: NEXIUM Take 1 capsule (40 mg total) by mouth daily.   ezetimibe 10 MG tablet Commonly known as: ZETIA TAKE 1 TABLET BY MOUTH EVERY DAY   fluticasone 50 MCG/ACT nasal spray Commonly known as: FLONASE USE TWO SPRAYS IN EACH NOSTRIL EVERY DAY AS NEEDED   folic acid 1 MG tablet Commonly known as: FOLVITE TAKE ONE TABLET BY MOUTH ONCE DAILY - EMERGENCY REFILL FAXED DR.     gabapentin 800 MG tablet Commonly known as: NEURONTIN Take 1 tablet (800 mg total) by mouth 3 (three) times daily.   HYDROmorphone 4 MG tablet Commonly known as: DILAUDID TAKE 1 TABLET BY MOUTH EVERY DAY AS NEEDED FOR PAIN   metolazone 2.5 MG tablet Commonly known as: ZAROXOLYN Take 1 tablet (2.5 mg total) by mouth 3 (three) times a week.   MULTI COMPLETE PO Take 1 tablet by mouth daily.   Muscle Rub 10-15 % Crea Apply 1 application topically 3 (three) times daily as needed for muscle pain. For muscle pain   neomycin-bacitracin-polymyxin ointment Commonly known as: NEOSPORIN Apply 1 application topically every 12 (twelve) hours as needed for wound care. Reported on 06/01/2015   potassium chloride SA 20 MEQ tablet Commonly known as: KLOR-CON Take 2 tablets (40 mEq total) by mouth 2 (two) times daily.   pravastatin 10 MG tablet Commonly known as: PRAVACHOL Take 1 tablet (10 mg total) by mouth daily.   propranolol 20 MG tablet Commonly known as: INDERAL TAKE 1 TABLET BY MOUTH DAILY   QUEtiapine 100 MG tablet Commonly known as: SEROquel Take 1 tablet (100 mg total) by mouth at bedtime.   torsemide 20 MG tablet Commonly known as: DEMADEX Take 3 tabs twice daily        PATIENT EDUCATION & FOLLOW-UP PLAN  An appointment for Transitional Care Management is scheduled with Dettinger, Fransisca Kaufmann, MD on 05/08/2019 at 3:55 pm.  Take all medications as prescribed  Contact our office by calling [phone] if you have any questions or concerns

## 2019-04-27 NOTE — Telephone Encounter (Signed)
Appointment scheduled.

## 2019-04-27 NOTE — Telephone Encounter (Signed)
    Transitional Care Management  Contact Attempt Attempt Date:04/27/2019 Attempted By: Zannie Cove, LPN  1st unsuccessful TCM contact attempt.   I reached out to [place] on his preferred telephone number to discuss Transitional Care Management, medication reconciliation, and to schedule a TCM hospital follow-up with his PCP at [facility].  Discharge Date: 04/25/2019 Location: [facility]  Discharge Dx: acute on chronic diastolic (congestive) heart failure  Recommendations for Outpatient Follow-up:   Recommendations for Outpatient Follow-up:  1. Follow up with PCP in 1 weeks 2. Follow up with urologist in 2 weeks Please obtain BMP in 1-2 weeks to follow electrolytes   Plan I left a HIPPA compliant message for him to return my call.  Will attempt to contact again within the 2 business day post discharge window if he does not return my call.

## 2019-04-28 DIAGNOSIS — G4733 Obstructive sleep apnea (adult) (pediatric): Secondary | ICD-10-CM | POA: Diagnosis not present

## 2019-04-28 DIAGNOSIS — J449 Chronic obstructive pulmonary disease, unspecified: Secondary | ICD-10-CM | POA: Diagnosis not present

## 2019-05-03 ENCOUNTER — Other Ambulatory Visit: Payer: Self-pay | Admitting: Family Medicine

## 2019-05-08 ENCOUNTER — Telehealth: Payer: Self-pay | Admitting: Family Medicine

## 2019-05-08 ENCOUNTER — Ambulatory Visit: Payer: Medicare HMO | Admitting: Family Medicine

## 2019-05-11 ENCOUNTER — Telehealth: Payer: Self-pay | Admitting: Family Medicine

## 2019-05-11 NOTE — Telephone Encounter (Signed)
Appointment scheduled.

## 2019-05-11 NOTE — Telephone Encounter (Signed)
Called patient to reschedule hospital follow up, no answer, left voice message to return call

## 2019-05-14 ENCOUNTER — Other Ambulatory Visit: Payer: Self-pay

## 2019-05-14 ENCOUNTER — Encounter (HOSPITAL_COMMUNITY): Payer: Self-pay | Admitting: Emergency Medicine

## 2019-05-14 ENCOUNTER — Inpatient Hospital Stay (HOSPITAL_COMMUNITY)
Admission: EM | Admit: 2019-05-14 | Discharge: 2019-05-18 | DRG: 292 | Disposition: A | Discharge status: Home / Self Care | Payer: Medicare HMO | Attending: Internal Medicine | Admitting: Internal Medicine

## 2019-05-14 ENCOUNTER — Emergency Department (HOSPITAL_COMMUNITY): Payer: Medicare HMO

## 2019-05-14 DIAGNOSIS — E1169 Type 2 diabetes mellitus with other specified complication: Secondary | ICD-10-CM | POA: Diagnosis present

## 2019-05-14 DIAGNOSIS — E119 Type 2 diabetes mellitus without complications: Secondary | ICD-10-CM

## 2019-05-14 DIAGNOSIS — G894 Chronic pain syndrome: Secondary | ICD-10-CM | POA: Diagnosis present

## 2019-05-14 DIAGNOSIS — Z8719 Personal history of other diseases of the digestive system: Secondary | ICD-10-CM

## 2019-05-14 DIAGNOSIS — R69 Illness, unspecified: Secondary | ICD-10-CM | POA: Diagnosis not present

## 2019-05-14 DIAGNOSIS — K7030 Alcoholic cirrhosis of liver without ascites: Secondary | ICD-10-CM | POA: Diagnosis present

## 2019-05-14 DIAGNOSIS — E871 Hypo-osmolality and hyponatremia: Secondary | ICD-10-CM | POA: Diagnosis not present

## 2019-05-14 DIAGNOSIS — E1142 Type 2 diabetes mellitus with diabetic polyneuropathy: Secondary | ICD-10-CM | POA: Diagnosis present

## 2019-05-14 DIAGNOSIS — I480 Paroxysmal atrial fibrillation: Secondary | ICD-10-CM | POA: Diagnosis present

## 2019-05-14 DIAGNOSIS — M109 Gout, unspecified: Secondary | ICD-10-CM | POA: Diagnosis present

## 2019-05-14 DIAGNOSIS — K219 Gastro-esophageal reflux disease without esophagitis: Secondary | ICD-10-CM | POA: Diagnosis present

## 2019-05-14 DIAGNOSIS — Z800 Family history of malignant neoplasm of digestive organs: Secondary | ICD-10-CM

## 2019-05-14 DIAGNOSIS — F329 Major depressive disorder, single episode, unspecified: Secondary | ICD-10-CM | POA: Diagnosis present

## 2019-05-14 DIAGNOSIS — I110 Hypertensive heart disease with heart failure: Secondary | ICD-10-CM | POA: Diagnosis not present

## 2019-05-14 DIAGNOSIS — I440 Atrioventricular block, first degree: Secondary | ICD-10-CM | POA: Diagnosis present

## 2019-05-14 DIAGNOSIS — E876 Hypokalemia: Secondary | ICD-10-CM | POA: Diagnosis not present

## 2019-05-14 DIAGNOSIS — M069 Rheumatoid arthritis, unspecified: Secondary | ICD-10-CM | POA: Diagnosis present

## 2019-05-14 DIAGNOSIS — E785 Hyperlipidemia, unspecified: Secondary | ICD-10-CM | POA: Diagnosis present

## 2019-05-14 DIAGNOSIS — I5033 Acute on chronic diastolic (congestive) heart failure: Principal | ICD-10-CM | POA: Diagnosis present

## 2019-05-14 DIAGNOSIS — I152 Hypertension secondary to endocrine disorders: Secondary | ICD-10-CM | POA: Diagnosis present

## 2019-05-14 DIAGNOSIS — Z20822 Contact with and (suspected) exposure to covid-19: Secondary | ICD-10-CM | POA: Diagnosis not present

## 2019-05-14 DIAGNOSIS — Z885 Allergy status to narcotic agent status: Secondary | ICD-10-CM

## 2019-05-14 DIAGNOSIS — D61818 Other pancytopenia: Secondary | ICD-10-CM | POA: Diagnosis present

## 2019-05-14 DIAGNOSIS — D696 Thrombocytopenia, unspecified: Secondary | ICD-10-CM | POA: Diagnosis not present

## 2019-05-14 DIAGNOSIS — E1165 Type 2 diabetes mellitus with hyperglycemia: Secondary | ICD-10-CM | POA: Diagnosis not present

## 2019-05-14 DIAGNOSIS — E1159 Type 2 diabetes mellitus with other circulatory complications: Secondary | ICD-10-CM | POA: Diagnosis not present

## 2019-05-14 DIAGNOSIS — E873 Alkalosis: Secondary | ICD-10-CM | POA: Diagnosis not present

## 2019-05-14 DIAGNOSIS — I10 Essential (primary) hypertension: Secondary | ICD-10-CM | POA: Diagnosis present

## 2019-05-14 DIAGNOSIS — F17220 Nicotine dependence, chewing tobacco, uncomplicated: Secondary | ICD-10-CM | POA: Diagnosis present

## 2019-05-14 DIAGNOSIS — Z791 Long term (current) use of non-steroidal anti-inflammatories (NSAID): Secondary | ICD-10-CM

## 2019-05-14 DIAGNOSIS — K429 Umbilical hernia without obstruction or gangrene: Secondary | ICD-10-CM | POA: Diagnosis present

## 2019-05-14 DIAGNOSIS — Z888 Allergy status to other drugs, medicaments and biological substances status: Secondary | ICD-10-CM

## 2019-05-14 DIAGNOSIS — Z79899 Other long term (current) drug therapy: Secondary | ICD-10-CM

## 2019-05-14 DIAGNOSIS — Z6841 Body Mass Index (BMI) 40.0 and over, adult: Secondary | ICD-10-CM

## 2019-05-14 DIAGNOSIS — K7460 Unspecified cirrhosis of liver: Secondary | ICD-10-CM | POA: Diagnosis present

## 2019-05-14 DIAGNOSIS — G4733 Obstructive sleep apnea (adult) (pediatric): Secondary | ICD-10-CM | POA: Diagnosis present

## 2019-05-14 DIAGNOSIS — R0602 Shortness of breath: Secondary | ICD-10-CM | POA: Diagnosis not present

## 2019-05-14 DIAGNOSIS — Z7982 Long term (current) use of aspirin: Secondary | ICD-10-CM

## 2019-05-14 DIAGNOSIS — Z8639 Personal history of other endocrine, nutritional and metabolic disease: Secondary | ICD-10-CM

## 2019-05-14 LAB — CBC WITH DIFFERENTIAL/PLATELET
Abs Immature Granulocytes: 0.01 K/uL (ref 0.00–0.07)
Basophils Absolute: 0 K/uL (ref 0.0–0.1)
Basophils Relative: 2 %
Eosinophils Absolute: 0.1 K/uL (ref 0.0–0.5)
Eosinophils Relative: 8 %
HCT: 30.9 % — ABNORMAL LOW (ref 39.0–52.0)
Hemoglobin: 10.7 g/dL — ABNORMAL LOW (ref 13.0–17.0)
Immature Granulocytes: 1 %
Lymphocytes Relative: 21 %
Lymphs Abs: 0.2 K/uL — ABNORMAL LOW (ref 0.7–4.0)
MCH: 27.4 pg (ref 26.0–34.0)
MCHC: 34.6 g/dL (ref 30.0–36.0)
MCV: 79 fL — ABNORMAL LOW (ref 80.0–100.0)
Monocytes Absolute: 0.1 K/uL (ref 0.1–1.0)
Monocytes Relative: 12 %
Neutro Abs: 0.5 K/uL — ABNORMAL LOW (ref 1.7–7.7)
Neutrophils Relative %: 56 %
Platelets: 40 K/uL — ABNORMAL LOW (ref 150–400)
RBC: 3.91 MIL/uL — ABNORMAL LOW (ref 4.22–5.81)
RDW: 15.4 % (ref 11.5–15.5)
WBC: 1 K/uL — CL (ref 4.0–10.5)
nRBC: 0 % (ref 0.0–0.2)

## 2019-05-14 LAB — BASIC METABOLIC PANEL
Anion gap: 11 (ref 5–15)
BUN: 15 mg/dL (ref 6–20)
CO2: 29 mmol/L (ref 22–32)
Calcium: 8.7 mg/dL — ABNORMAL LOW (ref 8.9–10.3)
Chloride: 87 mmol/L — ABNORMAL LOW (ref 98–111)
Creatinine, Ser: 0.87 mg/dL (ref 0.61–1.24)
GFR calc Af Amer: >60 mL/min (ref >60)
GFR calc non Af Amer: >60 mL/min (ref >60)
Glucose, Bld: 132 mg/dL — ABNORMAL HIGH (ref 70–99)
Potassium: 2.8 mmol/L — ABNORMAL LOW (ref 3.5–5.1)
Sodium: 127 mmol/L — ABNORMAL LOW (ref 135–145)

## 2019-05-14 LAB — RESPIRATORY PANEL BY RT PCR (FLU A&B, COVID)
Influenza A by PCR: NEGATIVE
Influenza B by PCR: NEGATIVE
SARS Coronavirus 2 by RT PCR: NEGATIVE

## 2019-05-14 LAB — GLUCOSE, CAPILLARY
Glucose-Capillary: 77 mg/dL (ref 70–99)
Glucose-Capillary: 109 mg/dL — ABNORMAL HIGH (ref 70–99)

## 2019-05-14 LAB — MAGNESIUM: Magnesium: 1.5 mg/dL — ABNORMAL LOW (ref 1.7–2.4)

## 2019-05-14 LAB — BRAIN NATRIURETIC PEPTIDE: B Natriuretic Peptide: 158 pg/mL — ABNORMAL HIGH (ref 0.0–100.0)

## 2019-05-14 LAB — TROPONIN I (HIGH SENSITIVITY)
Troponin I (High Sensitivity): 5 ng/L (ref <18)
Troponin I (High Sensitivity): 5 ng/L (ref <18)

## 2019-05-14 MED ORDER — FUROSEMIDE 10 MG/ML IJ SOLN
40.0000 mg | Freq: Once | INTRAMUSCULAR | Status: AC
  Administered 2019-05-14: 40 mg via INTRAVENOUS
  Filled 2019-05-14: qty 4

## 2019-05-14 MED ORDER — FUROSEMIDE 10 MG/ML IJ SOLN
40.0000 mg | Freq: Two times a day (BID) | INTRAMUSCULAR | Status: DC
  Administered 2019-05-14 (×2): 40 mg via INTRAVENOUS
  Filled 2019-05-14 (×3): qty 4

## 2019-05-14 MED ORDER — ASPIRIN EC 81 MG PO TBEC
81.0000 mg | DELAYED_RELEASE_TABLET | Freq: Every day | ORAL | Status: DC
  Administered 2019-05-14 – 2019-05-18 (×5): 81 mg via ORAL
  Filled 2019-05-14 (×5): qty 1

## 2019-05-14 MED ORDER — NICOTINE 14 MG/24HR TD PT24
14.0000 mg | MEDICATED_PATCH | Freq: Every day | TRANSDERMAL | Status: DC
  Administered 2019-05-15 – 2019-05-16 (×2): 14 mg via TRANSDERMAL
  Filled 2019-05-14 (×4): qty 1

## 2019-05-14 MED ORDER — HYDROMORPHONE HCL 2 MG PO TABS
2.0000 mg | ORAL_TABLET | ORAL | Status: DC | PRN
  Administered 2019-05-14 – 2019-05-17 (×16): 2 mg via ORAL
  Filled 2019-05-14 (×16): qty 1

## 2019-05-14 MED ORDER — POTASSIUM CHLORIDE CRYS ER 20 MEQ PO TBCR
40.0000 meq | EXTENDED_RELEASE_TABLET | Freq: Once | ORAL | Status: AC
  Administered 2019-05-14: 40 meq via ORAL
  Filled 2019-05-14: qty 2

## 2019-05-14 MED ORDER — POTASSIUM CHLORIDE 10 MEQ/100ML IV SOLN
10.0000 meq | INTRAVENOUS | Status: AC
  Administered 2019-05-14 (×2): 10 meq via INTRAVENOUS
  Filled 2019-05-14 (×2): qty 100

## 2019-05-14 MED ORDER — ONDANSETRON HCL 4 MG PO TABS: 4.0000 mg | ORAL_TABLET | Freq: Four times a day (QID) | ORAL | Status: DC | PRN

## 2019-05-14 MED ORDER — ACETAMINOPHEN 650 MG RE SUPP: 650.0000 mg | Freq: Four times a day (QID) | RECTAL | Status: DC | PRN

## 2019-05-14 MED ORDER — COLCHICINE 0.6 MG PO TABS
1.8000 mg | ORAL_TABLET | Freq: Once | ORAL | Status: AC
  Administered 2019-05-14: 1.8 mg via ORAL
  Filled 2019-05-14: qty 3

## 2019-05-14 MED ORDER — INSULIN ASPART 100 UNIT/ML ~~LOC~~ SOLN
0.0000 [IU] | Freq: Three times a day (TID) | SUBCUTANEOUS | Status: DC
  Administered 2019-05-16 – 2019-05-17 (×2): 2 [IU] via SUBCUTANEOUS

## 2019-05-14 MED ORDER — ONDANSETRON HCL 4 MG/2ML IJ SOLN: 4.0000 mg | Freq: Four times a day (QID) | INTRAMUSCULAR | Status: DC | PRN

## 2019-05-14 MED ORDER — POTASSIUM CHLORIDE CRYS ER 20 MEQ PO TBCR
40.0000 meq | EXTENDED_RELEASE_TABLET | Freq: Two times a day (BID) | ORAL | Status: DC
  Administered 2019-05-14 (×2): 40 meq via ORAL
  Filled 2019-05-14 (×2): qty 2

## 2019-05-14 MED ORDER — INSULIN ASPART 100 UNIT/ML ~~LOC~~ SOLN: 0.0000 [IU] | Freq: Every day | SUBCUTANEOUS | Status: DC

## 2019-05-14 MED ORDER — ACETAMINOPHEN 325 MG PO TABS: 650.0000 mg | ORAL_TABLET | Freq: Four times a day (QID) | ORAL | Status: DC | PRN

## 2019-05-14 MED ORDER — COLCHICINE 0.6 MG PO TABS
0.6000 mg | ORAL_TABLET | Freq: Every day | ORAL | Status: DC
  Administered 2019-05-15 – 2019-05-16 (×2): 0.6 mg via ORAL
  Filled 2019-05-14 (×2): qty 1

## 2019-05-14 NOTE — Progress Notes (Signed)
Attempted to call report to floor, nurse to call back.

## 2019-05-14 NOTE — ED Provider Notes (Signed)
[facility] EMERGENCY DEPARTMENT Provider Note   CSN: [id number] Arrival date & time: 05/14/19  0546   History Chief Complaint  Patient presents with  . Panic Attack    [name] is a 54 y.o. male.  The history is provided by the patient.  He has history of hypertension, diabetes, cirrhosis, diastolic heart failure, atrial fibrillation and comes in because of a panic attack.  He states that he woke up to urinated and felt like he could not breathe.  He denies chest pain, heaviness, tightness, pressure.  He continues to to have dyspnea.  He states that he does take torsemide 20 mg 3 times a day.  He does not monitor his weight.  He had denies exposure to COVID-19 and states he has not received the COVID-19 vaccination.  He states he did have a similar episode several weeks ago and had to stay in the hospital to get intravenous furosemide.  He has history of leg edema but had not noticed his legs getting more swollen.  He has noticed scrotal swelling.  Past Medical History:  Diagnosis Date  . Anxiety   . Atrial fibrillation ([place])   . CHF (congestive heart failure) ([place])   . Cirrhosis ([place])    NASH  . Constipation   . COPD (chronic obstructive pulmonary disease) ([place])   . Depression, recurrent ([place]) 07/18/2016  . Diabetes mellitus    type 2  . GERD (gastroesophageal reflux disease)   . Gout   . Hypertension associated with diabetes ([place])   . Leukopenia 07/08/2015  . Neuromuscular disorder (HCC)    neuropathy in hands and feet  . Psoriasis   . RA (rheumatoid arthritis) ([place])   . Sleep apnea    cpap used- level 10 and greater  . Thrombocytopenia [facility])     Patient Active Problem List   Diagnosis Date Noted  . Scrotal edema 04/22/2019  . Bilateral hydrocele 04/22/2019  . Acute on chronic diastolic heart failure ([place])   . Elevated AST (SGOT)   . Pancytopenia ([place])   . Bilateral primary osteoarthritis of knee 04/04/2017  . Depression, recurrent ([place]) 07/18/2016  .  Hyperlipidemia associated with type 2 diabetes mellitus ([place]) 10/31/2015  . Leukopenia 07/08/2015  . Thrombocytopenia ([place]) 07/08/2015  . Portal vein thrombosis 06/08/2015  . Genu varus   . Hypogonadism male 05/05/2012  . Umbilical hernia, reducible 12/26/2011  . Asthma 08/28/2011  . Cirrhosis ([place]) 08/14/2011  . UNSPECIFIED TACHYCARDIA 02/01/2010  . T2DM (type 2 diabetes mellitus) ([place]) 05/22/2008  . Gout 05/22/2008  . Hypertension associated with diabetes ([place]) 05/22/2008  . Atrial fibrillation ([place]) 05/22/2008  . DIASTOLIC HEART FAILURE, CHRONIC 05/22/2008  . OBESITY, MORBID 02/21/2007  . Coronary atherosclerosis 02/21/2007  . OSA (obstructive sleep apnea) 02/04/2007    Past Surgical History:  Procedure Laterality Date  . ANKLE SURGERY     right ankle talor repair  . CHOLECYSTECTOMY  sept 2016  . CHOLECYSTECTOMY    . COLONOSCOPY  05/08/2011   Procedure: COLONOSCOPY;  Surgeon: Rogene Houston, MD;  Location: [facility] ENDO SUITE;  Service: Endoscopy;  Laterality: N/A;  730  . ESOPHAGOGASTRODUODENOSCOPY  02/28/2011   Procedure: ESOPHAGOGASTRODUODENOSCOPY (EGD);  Surgeon: Rogene Houston, MD;  Location: [facility] ENDO SUITE;  Service: Endoscopy;  Laterality: N/A;  1200  . ESOPHAGOGASTRODUODENOSCOPY N/A 06/11/2012   Procedure: ESOPHAGOGASTRODUODENOSCOPY (EGD);  Surgeon: Rogene Houston, MD;  Location: [facility] ENDO SUITE;  Service: Endoscopy;  Laterality: N/A;  1200  FYI patient is 400 pounds  .  ESOPHAGOGASTRODUODENOSCOPY (EGD) WITH PROPOFOL N/A 04/21/2014   Procedure: ESOPHAGOGASTRODUODENOSCOPY (EGD) WITH PROPOFOL;  Surgeon: Rogene Houston, MD;  Location: [facility] ORS;  Service: Endoscopy;  Laterality: N/A;  . HERNIA REPAIR Right    as child; inguinal  . HERNIA REPAIR  sept 2016  . LIVER BIOPSY  2012  . SCIATIC NERVE EXPLORATION    . TONSILLECTOMY         Family History  Problem Relation Age of Onset  . Colon cancer Mother   . Cancer Mother        intestine  . Cancer Father        throat  .  Cancer Brother        brain  . Cancer Sister   . Heart attack Neg Hx   . Stroke Neg Hx     Social History   Tobacco Use  . Smoking status: Never Smoker  . Smokeless tobacco: Current User    Types: Snuff, Chew  . Tobacco comment: Pt reports that he "dips"  Substance Use Topics  . Alcohol use: No    Alcohol/week: 1.0 standard drinks    Types: 1 Cans of beer per week    Comment: stopped drinking 2-3 yrs ago.   . Drug use: No    Comment: Use to smoke cocaine and marijuana. No IV drug use    Home Medications Prior to Admission medications   Medication Sig Start Date End Date Taking? Authorizing Provider  aspirin EC 81 MG tablet Take 81 mg by mouth every 6 (six) hours as needed (CHEST PAIN).    [provider]  baclofen (LIORESAL) 10 MG tablet TAKE 1 TABLET BY MOUTH THREE TIMES DAILY Patient taking differently: Take 10 mg by mouth as needed.  12/01/18   Dettinger, Fransisca Kaufmann, MD  betamethasone dipropionate 0.05 % cream APPLY TO AFFECTED AREA TWICE A DAY 01/21/19   Dettinger, Fransisca Kaufmann, MD  busPIRone (BUSPAR) 10 MG tablet Take 1 tablet (10 mg total) by mouth 3 (three) times daily. 02/04/19   Dettinger, Fransisca Kaufmann, MD  celecoxib (CELEBREX) 100 MG capsule Take 1 capsule (100 mg total) by mouth 2 (two) times daily. 03/04/19   Dettinger, Fransisca Kaufmann, MD  colchicine 0.6 MG tablet TAKE 1 TABLET BY MOUTH TWICE A DAY AS NEEDED 05/04/19   Dettinger, Fransisca Kaufmann, MD  CONSTULOSE 10 GM/15ML solution take TWO tablespoonsful (30 mls) BY MOUTH TWICE DAILY 05/28/18   Dettinger, Fransisca Kaufmann, MD  diltiazem (CARDIZEM SR) 90 MG 12 hr capsule Take 1 capsule (90 mg total) by mouth daily as needed (palpitations). 02/23/19   Dettinger, Fransisca Kaufmann, MD  diltiazem (CARTIA XT) 300 MG 24 hr capsule TAKE ONE CAPSULE BY MOUTH EVERY DAY 03/02/19   Dettinger, Fransisca Kaufmann, MD  DULoxetine (CYMBALTA) 60 MG capsule Take 60 mg by mouth at bedtime.  08/27/18   [provider]  esomeprazole (NEXIUM) 40 MG capsule Take 1 capsule (40 mg  total) by mouth daily. 03/04/19   Dettinger, Fransisca Kaufmann, MD  ezetimibe (ZETIA) 10 MG tablet TAKE 1 TABLET BY MOUTH EVERY DAY 02/20/18   Dettinger, Fransisca Kaufmann, MD  fluticasone (FLONASE) 50 MCG/ACT nasal spray USE TWO SPRAYS IN EACH NOSTRIL EVERY DAY AS NEEDED 12/22/18   Dettinger, Fransisca Kaufmann, MD  folic acid (FOLVITE) 1 MG tablet TAKE ONE TABLET BY MOUTH ONCE DAILY - EMERGENCY REFILL FAXED DR. 01/14/17   Butch Penny, NP  gabapentin (NEURONTIN) 800 MG tablet Take 1 tablet (800 mg total) by mouth 3 (  three) times daily. 01/14/19   Dettinger, Fransisca Kaufmann, MD  HYDROmorphone (DILAUDID) 4 MG tablet TAKE 1 TABLET BY MOUTH EVERY DAY AS NEEDED FOR PAIN 07/14/18   [provider]  Menthol-Methyl Salicylate (MUSCLE RUB) 10-15 % CREA Apply 1 application topically 3 (three) times daily as needed for muscle pain. For muscle pain     [provider]  metolazone (ZAROXOLYN) 2.5 MG tablet Take 1 tablet (2.5 mg total) by mouth 3 (three) times a week. 04/27/19   Johnson, Clanford L, MD  Multiple Vitamins-Minerals (MULTI COMPLETE PO) Take 1 tablet by mouth daily.     [provider]  neomycin-bacitracin-polymyxin (NEOSPORIN) ointment Apply 1 application topically every 12 (twelve) hours as needed for wound care. Reported on 06/01/2015    [provider]  potassium chloride SA (KLOR-CON) 20 MEQ tablet Take 2 tablets (40 mEq total) by mouth 2 (two) times daily. 04/25/19 05/25/19  Johnson, Clanford L, MD  pravastatin (PRAVACHOL) 10 MG tablet Take 1 tablet (10 mg total) by mouth daily. 05/26/18   Dettinger, Fransisca Kaufmann, MD  propranolol (INDERAL) 20 MG tablet TAKE 1 TABLET BY MOUTH DAILY 02/03/19   Dettinger, Fransisca Kaufmann, MD  QUEtiapine (SEROQUEL) 100 MG tablet Take 1 tablet (100 mg total) by mouth at bedtime. 03/04/19   Dettinger, Fransisca Kaufmann, MD  torsemide (DEMADEX) 20 MG tablet Take 3 tabs twice daily 04/26/19   Murlean Iba, MD    Allergies    Allopurinol, Oxycodone base, and Prozac [fluoxetine hcl]  Review  of Systems   Review of Systems  All other systems reviewed and are negative.   Physical Exam Updated Vital Signs BP (!) 179/116 (BP Location: Right Arm)   Pulse 99   Temp 98 F (36.7 C) (Oral)   Resp 20   Ht 5' 11.5" (1.816 m)   Wt (!) 164 kg   SpO2 99%   BMI 49.72 kg/m   Physical Exam Vitals and nursing note reviewed.   Morbidly obese 54 year old male, resting comfortably and in no acute distress. Vital signs are significant for elevated blood pressure. Oxygen saturation is 99%, which is normal. Head is normocephalic and atraumatic. PERRLA, EOMI. Oropharynx is clear. Neck is nontender and supple without adenopathy or JVD. Back is nontender and there is no CVA tenderness. Lungs are clear without rales, wheezes, or rhonchi. Chest is nontender. Heart has regular rate and rhythm without murmur. Abdomen is soft, flat, nontender without masses or hepatosplenomegaly and peristalsis is normoactive. Genitalia: Circumcised penis.  Moderate to severe scrotal edema present. Extremities have 2+ edema, full range of motion is present. Skin is warm and mildly diaphoretic without rash. Neurologic: Mental status is normal, cranial nerves are intact, there are no motor or sensory deficits.  ED Results / Procedures / Treatments   Labs (all labs ordered are listed, but only abnormal results are displayed) Labs Reviewed  BASIC METABOLIC PANEL - Abnormal; Notable for the following components:      Result Value   Sodium 127 (*)    Potassium 2.8 (*)    Chloride 87 (*)    Glucose, Bld 132 (*)    Calcium 8.7 (*)    All other components within normal limits  BRAIN NATRIURETIC PEPTIDE - Abnormal; Notable for the following components:   B Natriuretic Peptide 158.0 (*)    All other components within normal limits  CBC WITH DIFFERENTIAL/PLATELET - Abnormal; Notable for the following components:   WBC 1.0 (*)    RBC 3.91 (*)  Hemoglobin 10.7 (*)    HCT 30.9 (*)    MCV 79.0 (*)    Platelets  40 (*)    Neutro Abs 0.5 (*)    Lymphs Abs 0.2 (*)    All other components within normal limits  RESPIRATORY PANEL BY RT PCR (FLU A&B, COVID)  TROPONIN I (HIGH SENSITIVITY)    EKG EKG Interpretation  Date/Time:  Thursday May 14 2019 06:17:30 EDT Ventricular Rate:  92 PR Interval:    QRS Duration: 98 QT Interval:  383 QTC Calculation: 474 R Axis:   65 Text Interpretation: Sinus rhythm Prolonged PR interval Anterior infarct, old Borderline T abnormalities, inferior leads When compared with ECG of 04/17/2007, No significant change was found Confirmed by Delora Fuel ([id number]) on 05/14/2019 6:24:04 AM   Radiology DG Chest Port 1 View  Result Date: 05/14/2019 CLINICAL DATA:  Shortness of breath. EXAM: PORTABLE CHEST 1 VIEW COMPARISON:  04/22/2019. FINDINGS: Mediastinum and hilar structures normal. Stable cardiomegaly. No focal infiltrate. No pleural effusion or pneumothorax. No acute bony abnormality. IMPRESSION: Stable cardiomegaly.  No acute pulmonary disease. Electronically Signed   By: Marcello Moores  Register   On: 05/14/2019 06:40    Procedures Procedures  CRITICAL CARE Performed by: Delora Fuel Total critical care time: 40 minutes Critical care time was exclusive of separately billable procedures and treating other patients. Critical care was necessary to treat or prevent imminent or life-threatening deterioration. Critical care was time spent personally by me on the following activities: development of treatment plan with patient and/or surrogate as well as nursing, discussions with consultants, evaluation of patient's response to treatment, examination of patient, obtaining history from patient or surrogate, ordering and performing treatments and interventions, ordering and review of laboratory studies, ordering and review of radiographic studies, pulse oximetry and re-evaluation of patient's condition.  Medications Ordered in ED Medications  furosemide (LASIX) injection 40 mg (has no  administration in time range)    ED Course  I have reviewed the triage vital signs and the nursing notes.  Pertinent labs & imaging results that were available during my care of the patient were reviewed by me and considered in my medical decision making (see chart for details).  MDM Rules/Calculators/A&P Shortness of breath which seems likely to be diastolic heart failure based on physical exam.  No cough or fever to suggest pneumonia.  No risk factors for pulmonary embolism.  I do believe that his problem is physical on and not truly panic.  Old records reviewed confirming recent hospitalization for exacerbation of diastolic heart failure.  Will check chest x-ray and screening labs and will also give dose of furosemide.  Chest x-ray shows cardiomegaly but no pulmonary vascular congestion.  ECG is unchanged from baseline.  BNP is only modestly elevated at 158, but 1 can have significant diastolic heart failure with relatively normal BNP.  Electrolytes show hyponatremia which is probably delusional, and moderately severe hypokalemia.  He is given both oral and intravenous potassium.  CBC is significant for platelet count 40,000, moderate anemia with hemoglobin 10.7 which is 2 g drop compared with 3 weeks ago, and moderately severe leukopenia with WBC 1.0.  This is felt to be most likely secondary to his known history of cirrhosis.  Absolute neutrophil count is 500, and he is afebrile so no immediate intervention is necessary.  He has had white blood cell counts this low in the past.  He will need to be admitted for ongoing diuresis and potassium replacement.  Page has been placed  to [facility].  Final Clinical Impression(s) / ED Diagnoses Final diagnoses:  Acute on chronic diastolic heart failure ([place])  Pancytopenia ([place])  Hypokalemia  Hyponatremia  Morbid obesity ([place])    Rx / DC Orders ED Discharge Orders    None       Delora Fuel, MD 56/38/93 [phone]

## 2019-05-14 NOTE — Evaluation (Signed)
Physical Therapy Evaluation Patient Details Name: [name] MRN: [id number] DOB: [birth year] Today's Date: 05/14/2019   History of Present Illness  [name] is a 54 y.o. M with hx morbid obesity, OSA on CPAP, alcoholic cirrhosis, pancytopenia, DM and dCHF  who presents with PND.The patient was recently admitted to the hospital for fluid overload in setting of cirrhosis and congestive heart failure.  [place] then discharge due to "family emergency".  Since then he has been home, symptoms at baseline, adherent to medicines he reports (torsemide 3 times daily, occasional metolazone), but he has not been weighing himself daily.  In the last several days he has noticed scrotal edema starting again, and then the night of admission he woke with severe dyspnea, relieved with standing up.  He thought he was having a "panic attack" so he came to the emergency room.    Clinical Impression  Patient functioning near baseline for functional mobility and gait, demonstrates slow labored cadence using RW and limited due to c/o fatigue and BUE/LE weakness.  Patient states he uses his heavy duty axillary crutches and at home and encouraged him to have his family bring his crutches to hospital for him to use.  Patient tolerated sitting up in chair after therapy.  Patient will benefit from continued physical therapy in hospital and recommended venue below to increase strength, balance, endurance for safe ADLs and gait.     Follow Up Recommendations Home health PT;Supervision for mobility/OOB;Supervision - Intermittent    Equipment Recommendations  None recommended by PT    Recommendations for Other Services       Precautions / Restrictions Precautions Precautions: Fall Restrictions Weight Bearing Restrictions: No      Mobility  Bed Mobility Overal bed mobility: Modified Independent                Transfers Overall transfer level: Needs assistance Equipment used: Rolling walker (2  wheeled);None Transfers: Sit to/from [facility] Sit to Stand: Supervision Stand pivot transfers: Supervision       General transfer comment: able to stand without AD, safer using RW for transfers  Ambulation/Gait Ambulation/Gait assistance: Supervision;Min guard Gait Distance (Feet): 12 Feet Assistive device: Rolling walker (2 wheeled) Gait Pattern/deviations: Decreased step length - right;Decreased step length - left;Decreased stride length;Wide base of support;Ataxic Gait velocity: decreased   General Gait Details: slow labored cadence with wide base of support and axtaxic like movement, limited due to c/o fatiguing of BUE due weakness/supporting body weight  Stairs            Wheelchair Mobility    Modified Rankin (Stroke Patients Only)       Balance Overall balance assessment: Needs assistance Sitting-balance support: Feet supported;No upper extremity supported Sitting balance-Leahy Scale: Good Sitting balance - Comments: seated at EOB   Standing balance support: During functional activity;Bilateral upper extremity supported Standing balance-Leahy Scale: Fair Standing balance comment: using RW                             Pertinent Vitals/Pain Pain Assessment: 0-10 Pain Score: 7  Pain Location: left knee Pain Descriptors / Indicators: Sore;Aching Pain Intervention(s): Limited activity within patient's tolerance;Monitored during session    Home Living Family/patient expects to be discharged to:: Private residence Living Arrangements: Children;Non-relatives/Friends(roomate, stepson and stepson's girlfriend) Available Help at Discharge: Family;Available 24 hours/day;Friend(s) Type of Home: House Home Access: Stairs to enter Entrance Stairs-Rails: None Entrance Stairs-Number of Steps: 1 Home  Layout: One level Home Equipment: Crutches;Grab bars - toilet;Grab bars - tub/shower;Bedside commode;Shower seat;Other (comment) Additional  Comments: Heavy Duty Axillary crutches    Prior Function Level of Independence: Independent with assistive device(s)         Comments: household and short distanced community ambulator with heavy duty axillary crutches     Hand Dominance        Extremity/Trunk Assessment   Upper Extremity Assessment Upper Extremity Assessment: Generalized weakness    Lower Extremity Assessment Lower Extremity Assessment: Generalized weakness    Cervical / Trunk Assessment Cervical / Trunk Assessment: Normal  Communication   Communication: No difficulties  Cognition Arousal/Alertness: Awake/alert Behavior During Therapy: WFL for tasks assessed/performed Overall Cognitive Status: Within Functional Limits for tasks assessed                                        General Comments      Exercises     Assessment/Plan    PT Assessment Patient needs continued PT services  PT Problem List Decreased strength;Decreased activity tolerance;Decreased balance;Decreased mobility       PT Treatment Interventions Balance training;Gait training;Stair training;Functional mobility training;Therapeutic activities;Therapeutic exercise;Patient/family education    PT Goals (Current goals can be found in the Care Plan section)  Acute Rehab PT Goals Patient Stated Goal: increase BLE strength for walking and BUE for supporting body weight when using crutches PT Goal Formulation: With patient Time For Goal Achievement: 05/18/19 Potential to Achieve Goals: Good    Frequency Min 3X/week   Barriers to discharge        Co-evaluation               AM-PAC PT "6 Clicks" Mobility  Outcome Measure Help needed turning from your back to your side while in a flat bed without using bedrails?: None Help needed moving from lying on your back to sitting on the side of a flat bed without using bedrails?: None Help needed moving to and from a bed to a chair (including a wheelchair)?: A  Little Help needed standing up from a chair using your arms (e.g., wheelchair or bedside chair)?: None Help needed to walk in hospital room?: A Little Help needed climbing 3-5 steps with a railing? : A Lot 6 Click Score: 20    End of Session   Activity Tolerance: Patient tolerated treatment well;Patient limited by fatigue Patient left: in chair;with call bell/phone within reach Nurse Communication: Mobility status PT Visit Diagnosis: Unsteadiness on feet (R26.81);Other abnormalities of gait and mobility (R26.89);Muscle weakness (generalized) (M62.81)    Time: 8338-2505 PT Time Calculation (min) (ACUTE ONLY): 25 min   Charges:   PT Evaluation $PT Eval Moderate Complexity: 1 Mod PT Treatments $Therapeutic Activity: 23-37 mins        2:27 PM, 05/14/19 Lonell Grandchild, MPT Physical Therapist with [facility] [phone] office [phone] mobile phone

## 2019-05-14 NOTE — Plan of Care (Signed)
  Problem: Acute Rehab PT Goals(only PT should resolve) Goal: Pt Will Go Supine/Side To Sit Outcome: Progressing Flowsheets (Taken 05/14/2019 1429) Pt will go Supine/Side to Sit: Independently Goal: Patient Will Transfer Sit To/From Stand Outcome: Progressing Flowsheets (Taken 05/14/2019 1429) Patient will transfer sit to/from stand: with modified independence Goal: Pt Will Transfer Bed To Chair/Chair To Bed Outcome: Progressing Flowsheets (Taken 05/14/2019 1429) Pt will Transfer Bed to Chair/Chair to Bed:  with modified independence  with supervision Goal: Pt Will Ambulate Outcome: Progressing Flowsheets (Taken 05/14/2019 1429) Pt will Ambulate:  with supervision  50 feet  with rolling walker Note: Axillary crutches if patient can them brought to hospital   2:30 PM, 05/14/19 Lonell Grandchild, MPT Physical Therapist with [facility] [phone] office [phone] mobile phone

## 2019-05-14 NOTE — ED Notes (Signed)
Date and time results received: 05/14/19 0706 (use smartphrase ".now" to insert current time)  Test: WBC Critical Value: 1.0  Name of Provider Notified: Dr. Sedonia Small   Orders Received? Or Actions Taken?: None Yet

## 2019-05-14 NOTE — TOC Initial Note (Addendum)
Transition of Care [facility]) - Initial/Assessment Note   Patient Details  Name: [name] MRN: [id number] Date of Birth: 04-Jul-1965  [facility]) CM/SW Contact:    Sherie Don, LCSW Phone Number: 05/14/2019, 1:33 PM  Clinical Narrative: TOC received consult for CHF. CSW called patient to complete CHF assessment. Per patient, he has started to weigh himself a few days a week to monitor weight changes and he is now restricting his fluids. Patient also reported he was able to get his CPAP replaced.  CSW asked about HHRN to assist patient with managing his CHF. Patient agreeable to [facility] and did not have a preference for provider. CSW spoke with Vaughan Basta w/[facility], but [facility] unable to take additional [employer]'s at this time. CSW called Tim w/[facility], Santiago Glad w/[facility], and Judson Roch w/[facility] to make referral. All are unable to take patient's insurance as it is out of network and . CSW called Barrister's clerk with [facility]. Alvis Lemmings able to accept referral for RN, but an RN will not be available until early next week. Patient notified of accepted referral.  Expected Discharge Plan: [place] Barriers to Discharge: Continued Medical Work up, No [place] will accept this patient  Patient Goals and CMS Choice Patient states their goals for this hospitalization and ongoing recovery are:: Discharge home CMS Medicare.gov Compare Post Acute Care list provided to:: Patient Choice offered to / list presented to : Patient  Expected Discharge Plan and Services Expected Discharge Plan: [place] Choice: [place] arrangements for the past 2 months: [place]: [place] Date [place]: 05/14/19 Time [place]: 1941 Representative spoke with at [place]: Vaughan Basta w/[facility]; Tim w/[facility]; Judson Roch w/[facility]; Georgina Snell w/[facility]  Prior Living Arrangements/Services Living arrangements for the past 2 months:  Single Family Home Lives with:: Adult Children([place]) Patient language and need for interpreter reviewed:: Yes Do you feel safe going back to the place where you live?: Yes      Need for Family Participation in Patient Care: No (Comment) Care giver support system in place?: Yes (comment)([place])   Criminal Activity/Legal Involvement Pertinent to Current Situation/Hospitalization: No - Comment as needed  Emotional Assessment Appearance:: Appears older than stated age Attitude/Demeanor/Rapport: Engaged Affect (typically observed): Accepting Orientation: : Oriented to Self, Oriented to Place, Oriented to  Time, Oriented to Situation Alcohol / Substance Use: Tobacco Use Psych Involvement: No (comment)  Admission diagnosis:  Morbid obesity ([place]) [E66.01] Acute on chronic diastolic heart failure (HCC) [I50.33] Hypokalemia [E87.6] Hyponatremia [E87.1] Pancytopenia (HCC) [D61.818] Acute on chronic diastolic CHF (congestive heart failure) ([place]) [I50.33] Patient Active Problem List   Diagnosis Date Noted  . Acute on chronic diastolic CHF (congestive heart failure) ([place]) 05/14/2019  . Hypokalemia 05/14/2019  . Scrotal edema 04/22/2019  . Bilateral hydrocele 04/22/2019  . Acute on chronic diastolic heart failure ([place])   . Elevated AST (SGOT)   . Pancytopenia ([place])   . Bilateral primary osteoarthritis of knee 04/04/2017  . Depression, recurrent ([place]) 07/18/2016  . Hyperlipidemia associated with type 2 diabetes mellitus ([place]) 10/31/2015  . Leukopenia 07/08/2015  . Thrombocytopenia ([place]) 07/08/2015  . Portal vein thrombosis 06/08/2015  . Genu varus   . Hypogonadism male 05/05/2012  . Umbilical hernia, reducible 12/26/2011  . Asthma 08/28/2011  . Cirrhosis ([place]) 08/14/2011  . UNSPECIFIED TACHYCARDIA 02/01/2010  . T2DM (type 2 diabetes mellitus) ([place]) 05/22/2008  . Gout 05/22/2008  . Hypertension associated with  diabetes ([place]) 05/22/2008  . Atrial fibrillation ([place]) 05/22/2008  .  DIASTOLIC HEART FAILURE, CHRONIC 05/22/2008  . OBESITY, MORBID 02/21/2007  . Coronary atherosclerosis 02/21/2007  . OSA (obstructive sleep apnea) 02/04/2007   PCP:  Dettinger, Fransisca Kaufmann, MD Pharmacy:   [id number] [place]Phone: [phone]: [phone] Readmission Risk Interventions No flowsheet data found.

## 2019-05-14 NOTE — Progress Notes (Signed)
Report given to [facility] on 300.

## 2019-05-14 NOTE — ED Triage Notes (Signed)
Pt states he woke up this morning with a panic attack and was having trouble breathing.

## 2019-05-14 NOTE — H&P (Signed)
History and Physical  Patient Name: [name]     XBM:[id number]    DOB: May 29, 1965    DOA: 05/14/2019 PCP: Dettinger, Fransisca Kaufmann, MD  Patient coming from: Home  Chief Complaint: Dyspnea with waking      HPI: [name] is a 54 y.o. M with hx morbid obesity, OSA on CPAP, alcoholic cirrhosis, pancytopenia, DM and dCHF  who presents with PND.  The patient was recently admitted to the hospital for fluid overload in setting of cirrhosis and congestive heart failure.  Liters then discharge due to "family emergency".  Since then he has been home, symptoms at baseline, adherent to medicines he reports (torsemide 3 times daily, occasional metolazone), but he has not been weighing himself daily.  In the last several days he has noticed scrotal edema starting again, and then the night of admission he woke with severe dyspnea, relieved with standing up.  He thought he was having a "panic attack" so he came to the emergency room.  In the ER, BNP mildly elevated, hyponatremia and hypokalemia recurred, pancytopenia was stable, chest x-ray showed mild edema and EKG unchanged from baseline.  He was grossly fluid overloaded on exam, with scrotal edema and so was started on IV Lasix and the hospital service were asked to evaluate for potential admission.          ROS: Review of Systems  Constitutional: Negative for chills, fever and malaise/fatigue.  Respiratory: Positive for shortness of breath. Negative for cough, hemoptysis and sputum production.   Cardiovascular: Positive for orthopnea, leg swelling and PND. Negative for chest pain and palpitations.  Psychiatric/Behavioral: The patient is nervous/anxious.   All other systems reviewed and are negative.         Past Medical History:  Diagnosis Date  . Anxiety   . Atrial fibrillation ([place])   . CHF (congestive heart failure) ([place])   . Cirrhosis ([place])    NASH  . Constipation   . COPD (chronic obstructive pulmonary disease) ([place])    . Depression, recurrent ([place]) 07/18/2016  . Diabetes mellitus    type 2  . GERD (gastroesophageal reflux disease)   . Gout   . Hypertension associated with diabetes ([place])   . Leukopenia 07/08/2015  . Neuromuscular disorder ([facility])    neuropathy in hands and feet  . Psoriasis   . RA (rheumatoid arthritis) ([place])   . Sleep apnea    cpap used- level 10 and greater  . Thrombocytopenia ([place])     Past Surgical History:  Procedure Laterality Date  . ANKLE SURGERY     right ankle talor repair  . CHOLECYSTECTOMY  sept 2016  . CHOLECYSTECTOMY    . COLONOSCOPY  05/08/2011   Procedure: COLONOSCOPY;  Surgeon: Rogene Houston, MD;  Location: [facility];  Service: Endoscopy;  Laterality: N/A;  730  . ESOPHAGOGASTRODUODENOSCOPY  02/28/2011   Procedure: ESOPHAGOGASTRODUODENOSCOPY (EGD);  Surgeon: Rogene Houston, MD;  Location: [facility];  Service: Endoscopy;  Laterality: N/A;  1200  . ESOPHAGOGASTRODUODENOSCOPY N/A 06/11/2012   Procedure: ESOPHAGOGASTRODUODENOSCOPY (EGD);  Surgeon: Rogene Houston, MD;  Location: [facility];  Service: Endoscopy;  Laterality: N/A;  1200  FYI patient is 400 pounds  . ESOPHAGOGASTRODUODENOSCOPY (EGD) WITH PROPOFOL N/A 04/21/2014   Procedure: ESOPHAGOGASTRODUODENOSCOPY (EGD) WITH PROPOFOL;  Surgeon: Rogene Houston, MD;  Location: [facility];  Service: Endoscopy;  Laterality: N/A;  . HERNIA REPAIR Right    as child; inguinal  . HERNIA REPAIR  sept 2016  .  LIVER BIOPSY  2012  . SCIATIC NERVE EXPLORATION    . TONSILLECTOMY      Social History: Patient lives with a roommate.  The patient walks with a cane or walker.  Former smoker, currently uses smokeless tobacco.  Drinks one to two 40 ounce beers a day, which is "cutting down" from previous.  Allergies  Allergen Reactions  . Allopurinol Other (See Comments)    Joint pain worse   . Oxycodone Base Itching  . Prozac [Fluoxetine Hcl] Itching    Family history: family history includes Cancer in his brother,  father, mother, and sister; Colon cancer in his mother.  Prior to Admission medications   Medication Sig Start Date End Date Taking? Authorizing Provider  aspirin EC 81 MG tablet Take 81 mg by mouth every 6 (six) hours as needed (CHEST PAIN).    [provider]  baclofen (LIORESAL) 10 MG tablet TAKE 1 TABLET BY MOUTH THREE TIMES DAILY Patient taking differently: Take 10 mg by mouth as needed.  12/01/18   Dettinger, Fransisca Kaufmann, MD  betamethasone dipropionate 0.05 % cream APPLY TO AFFECTED AREA TWICE A DAY 01/21/19   Dettinger, Fransisca Kaufmann, MD  busPIRone (BUSPAR) 10 MG tablet Take 1 tablet (10 mg total) by mouth 3 (three) times daily. 02/04/19   Dettinger, Fransisca Kaufmann, MD  celecoxib (CELEBREX) 100 MG capsule Take 1 capsule (100 mg total) by mouth 2 (two) times daily. 03/04/19   Dettinger, Fransisca Kaufmann, MD  colchicine 0.6 MG tablet TAKE 1 TABLET BY MOUTH TWICE A DAY AS NEEDED 05/04/19   Dettinger, Fransisca Kaufmann, MD  CONSTULOSE 10 GM/15ML solution take TWO tablespoonsful (30 mls) BY MOUTH TWICE DAILY 05/28/18   Dettinger, Fransisca Kaufmann, MD  diltiazem (CARDIZEM SR) 90 MG 12 hr capsule Take 1 capsule (90 mg total) by mouth daily as needed (palpitations). 02/23/19   Dettinger, Fransisca Kaufmann, MD  diltiazem (CARTIA XT) 300 MG 24 hr capsule TAKE ONE CAPSULE BY MOUTH EVERY DAY 03/02/19   Dettinger, Fransisca Kaufmann, MD  DULoxetine (CYMBALTA) 60 MG capsule Take 60 mg by mouth at bedtime.  08/27/18   [provider]  esomeprazole (NEXIUM) 40 MG capsule Take 1 capsule (40 mg total) by mouth daily. 03/04/19   Dettinger, Fransisca Kaufmann, MD  ezetimibe (ZETIA) 10 MG tablet TAKE 1 TABLET BY MOUTH EVERY DAY 02/20/18   Dettinger, Fransisca Kaufmann, MD  fluticasone (FLONASE) 50 MCG/ACT nasal spray USE TWO SPRAYS IN EACH NOSTRIL EVERY DAY AS NEEDED 12/22/18   Dettinger, Fransisca Kaufmann, MD  folic acid (FOLVITE) 1 MG tablet TAKE ONE TABLET BY MOUTH ONCE DAILY - EMERGENCY REFILL FAXED DR. 01/14/17   Butch Penny, NP  gabapentin (NEURONTIN) 800 MG tablet Take 1 tablet  (800 mg total) by mouth 3 (three) times daily. 01/14/19   Dettinger, Fransisca Kaufmann, MD  HYDROmorphone (DILAUDID) 4 MG tablet TAKE 1 TABLET BY MOUTH EVERY DAY AS NEEDED FOR PAIN 07/14/18   [provider]  Menthol-Methyl Salicylate (MUSCLE RUB) 10-15 % CREA Apply 1 application topically 3 (three) times daily as needed for muscle pain. For muscle pain     [provider]  metolazone (ZAROXOLYN) 2.5 MG tablet Take 1 tablet (2.5 mg total) by mouth 3 (three) times a week. 04/27/19   Johnson, Clanford L, MD  Multiple Vitamins-Minerals (MULTI COMPLETE PO) Take 1 tablet by mouth daily.     [provider]  neomycin-bacitracin-polymyxin (NEOSPORIN) ointment Apply 1 application topically every 12 (twelve) hours as needed for wound care.  Reported on 06/01/2015    [provider]  potassium chloride SA (KLOR-CON) 20 MEQ tablet Take 2 tablets (40 mEq total) by mouth 2 (two) times daily. 04/25/19 05/25/19  Johnson, Clanford L, MD  pravastatin (PRAVACHOL) 10 MG tablet Take 1 tablet (10 mg total) by mouth daily. 05/26/18   Dettinger, Fransisca Kaufmann, MD  propranolol (INDERAL) 20 MG tablet TAKE 1 TABLET BY MOUTH DAILY 02/03/19   Dettinger, Fransisca Kaufmann, MD  QUEtiapine (SEROQUEL) 100 MG tablet Take 1 tablet (100 mg total) by mouth at bedtime. 03/04/19   Dettinger, Fransisca Kaufmann, MD  torsemide (DEMADEX) 20 MG tablet Take 3 tabs twice daily 04/26/19   Murlean Iba, MD       Physical Exam: BP (!) 143/100   Pulse 92   Temp 98 F (36.7 C) (Oral)   Resp 20   Ht 5' 11.5" (1.816 m)   Wt (!) 164 kg   SpO2 99%   BMI 49.72 kg/m  General appearance: Morbidly obese adult male, alert and in no acute distress, sitting in emergency room cot.   Eyes: Anicteric, conjunctiva pink, lids and lashes normal. PERRL.    ENT: No nasal deformity, discharge, epistaxis.  Hearing normal. OP moist without lesions.  Dentition in poor repair, smokeless tobacco noted. Neck: No neck masses.  Trachea midline.  No  thyromegaly/tenderness. Lymph: No cervical or supraclavicular lymphadenopathy. Skin: Warm and dry.  No jaundice.  No suspicious rashes or lesions. Cardiac: RRR, nl S1-S2, no murmurs appreciated.  Capillary refill is brisk.  JVP not visible.  Marked bilateral LE edema, moderate scrotal edema, and chronic venous stasis firm brawny change of bilateral lower extremities.  Radial pulses 2+ and symmetric. Respiratory: Normal respiratory rate and rhythm.  CTAB without rales or wheezes.  Appears dyspneic with exertion. Abdomen: Abdomen soft.  No TTP or guarding.  Umbilical hernia is noted, probably some ascites although this is difficult to judge given body habitus.  No obvious distention, cannot appreciate hepatosplenomegaly.    MSK: No deformities or effusions of the large joints of the upper or lower extremities bilaterally.  No cyanosis or clubbing. Neuro: Cranial nerves 3 through 12 intact.  Sensation intact to light touch. Speech is fluent.  Muscle strength mildly generally weak, but symmetric.    Psych: Sensorium intact and responding to questions, attention normal.  Behavior appropriate.  Affect normal.  Judgment and insight appear normal.     Labs on Admission:  I have personally reviewed following labs and imaging studies: CBC: Recent Labs  Lab 05/14/19 0620  WBC 1.0*  NEUTROABS 0.5*  HGB 10.7*  HCT 30.9*  MCV 79.0*  PLT 40*   Basic Metabolic Panel: Recent Labs  Lab 05/14/19 0620  NA 127*  K 2.8*  CL 87*  CO2 29  GLUCOSE 132*  BUN 15  CREATININE 0.87  CALCIUM 8.7*   GFR: Estimated Creatinine Clearance: 151.3 mL/min (by C-G formula based on SCr of 0.87 mg/dL).    Recent Results (from the past 240 hour(s))  Respiratory Panel by RT PCR (Flu A&B, Covid) - Nasopharyngeal Swab     Status: None   Collection Time: 05/14/19  6:08 AM   Specimen: Nasopharyngeal Swab  Result Value Ref Range Status   SARS Coronavirus 2 by RT PCR NEGATIVE NEGATIVE Final    Comment:  (NOTE) SARS-CoV-2 target nucleic acids are NOT DETECTED. The SARS-CoV-2 RNA is generally detectable in upper respiratoy specimens during the acute phase of infection. The lowest concentration of SARS-CoV-2 viral copies  this assay can detect is 131 copies/mL. A negative result does not preclude SARS-Cov-2 infection and should not be used as the sole basis for treatment or other patient management decisions. A negative result may occur with  improper specimen collection/handling, submission of specimen other than nasopharyngeal swab, presence of viral mutation(s) within the areas targeted by this assay, and inadequate number of viral copies (<131 copies/mL). A negative result must be combined with clinical observations, patient history, and epidemiological information. The expected result is Negative. Fact Sheet for Patients:  PinkCheek.be Fact Sheet for Healthcare Providers:  GravelBags.it This test is not yet ap proved or cleared by the [place] FDA and  has been authorized for detection and/or diagnosis of SARS-CoV-2 by FDA under an Emergency Use Authorization (EUA). This EUA will remain  in effect (meaning this test can be used) for the duration of the COVID-19 declaration under Section 564(b)(1) of the Act, 21 U.S.C. section 360bbb-3(b)(1), unless the authorization is terminated or revoked sooner.    Influenza A by PCR NEGATIVE NEGATIVE Final   Influenza B by PCR NEGATIVE NEGATIVE Final    Comment: (NOTE) The Xpert Xpress SARS-CoV-2/FLU/RSV assay is intended as an aid in  the diagnosis of influenza from Nasopharyngeal swab specimens and  should not be used as a sole basis for treatment. Nasal washings and  aspirates are unacceptable for Xpert Xpress SARS-CoV-2/FLU/RSV  testing. Fact Sheet for Patients: PinkCheek.be Fact Sheet for Healthcare  Providers: GravelBags.it This test is not yet approved or cleared by the [place] FDA and  has been authorized for detection and/or diagnosis of SARS-CoV-2 by  FDA under an Emergency Use Authorization (EUA). This EUA will remain  in effect (meaning this test can be used) for the duration of the  Covid-19 declaration under Section 564(b)(1) of the Act, 21  U.S.C. section 360bbb-3(b)(1), unless the authorization is  terminated or revoked. Performed at [facility], [address] [place], [place] [address]            Radiological Exams on Admission: Personally reviewed  chest x-ray shows no focal airspace disease or opacity: DG Chest Port 1 View  Result Date: 05/14/2019 CLINICAL DATA:  Shortness of breath. EXAM: PORTABLE CHEST 1 VIEW COMPARISON:  04/22/2019. FINDINGS: Mediastinum and hilar structures normal. Stable cardiomegaly. No focal infiltrate. No pleural effusion or pneumothorax. No acute bony abnormality. IMPRESSION: Stable cardiomegaly.  No acute pulmonary disease. Electronically Signed   By: Marcello Moores  Register   On: 05/14/2019 06:40    EKG: Independently reviewed. Rate 90s, small amplitude, diffuse T wave flattening, no ST changes, no change from previous.       Assessment/Plan   Acute on chronic diastolic CHF Patient with EF 55-60% last month, presents with med adherence but failure of diuretics and nonadherence to daily weights.  Now has recurrent scrotal edema and PND by history as well as mild CHF on CXR.  -Furosemide 40 mg IV twice a day  -K supplement -Strict I/Os, daily weights, telemetry  -Daily monitoring renal function      Hypokalemia -Check mag -Supplement K agressively  Alcoholic cirrhosis Still drinking 1-2 forty oz beers daily, but has cut down.  No HE or signs of GIB or SBP.   -Resume lactulose when med rec completed -Resume propranolol when med rec completed  Gout flare Patient has redness, warmth, and swelling  of the right ankle, as well as increased pain consistent with his previous gout flares.  Given his congestive heart failure flare I would  like to avoid NSAIDs. -Colchicine 1.8 mg once today, followed by resuming 0.6 mg once daily  OSA  - CPAP nightly  Morbid obesity BMI 49 with diabetes, sleep apnea -Continue PPI  Pancytopenia Platelets 40K, WBC 1K with ANC 500 is close to patient's baseline.  No obvious infection at this time.  Diabetes Glucose controlled -Check A1c -SS correction insulin ordered  NSAID use Patient reports taking indomethacin daily.  Given his difficult to treat congestive heart failure, I suspect this is unwise and should be avoided. -Stop indomethacin  Chronic pain -Resume Subutex, gabapentin when medication review is complete -Continue Dilaudid for now  Hypertension Hyperlipidemia Blood pressure elevated this morning -Resume diltiazem when med rec completed -Resume Zetia, pravastatin when med rec completed  Mood disorder -Stop duloxetine given cirrhosis -Resume Seroquel when med rec completed       DVT prophylaxis: SCDs  Code Status: FULL  Family Communication: Roommate at bedside  Disposition Plan: Anticipate IV diuretics for several days, likely needs 8-15L diuresis Consults called: None Admission status: INPATIENT   At the time of admission, it appears that the appropriate admission status for this patient is INPATIENT. This is judged to be reasonable and necessary in order to provide the required intensity of service to ensure the patient's safety.  Together, the patient is at high risk for further clinical deterioration threatening life, limb, or organ requiring a high intensity of service due to this severe exacerbation of their chronic organ failure.  I certify that at the point of admission it is my clinical judgment that the patient will require inpatient hospital care spanning beyond 2 midnights from the point of admission and that  early discharge would result in unnecessary risk of decompensation and readmission or threat to life, limb or bodily function.   Medical decision making: Patient seen at 8:04 AM on 05/14/2019.  The patient was discussed with Dr. Sedonia Small.  What exists of the patient's chart was reviewed in depth and summarized above.  Clinical condition: stable hemodyanmically.        [facility] Please page though [place] or Epic secure chat:  For password, contact charge nurse

## 2019-05-15 LAB — BASIC METABOLIC PANEL
Anion gap: 14 (ref 5–15)
BUN: 11 mg/dL (ref 6–20)
CO2: 31 mmol/L (ref 22–32)
Calcium: 9 mg/dL (ref 8.9–10.3)
Chloride: 91 mmol/L — ABNORMAL LOW (ref 98–111)
Creatinine, Ser: 0.67 mg/dL (ref 0.61–1.24)
GFR calc Af Amer: >60 mL/min (ref >60)
GFR calc non Af Amer: >60 mL/min (ref >60)
Glucose, Bld: 142 mg/dL — ABNORMAL HIGH (ref 70–99)
Potassium: 2.9 mmol/L — ABNORMAL LOW (ref 3.5–5.1)
Sodium: 136 mmol/L (ref 135–145)

## 2019-05-15 LAB — CBC
HCT: 32.8 % — ABNORMAL LOW (ref 39.0–52.0)
Hemoglobin: 10.7 g/dL — ABNORMAL LOW (ref 13.0–17.0)
MCH: 27 pg (ref 26.0–34.0)
MCHC: 32.6 g/dL (ref 30.0–36.0)
MCV: 82.6 fL (ref 80.0–100.0)
Platelets: 43 10*3/uL — ABNORMAL LOW (ref 150–400)
RBC: 3.97 MIL/uL — ABNORMAL LOW (ref 4.22–5.81)
RDW: 15.9 % — ABNORMAL HIGH (ref 11.5–15.5)
WBC: 1.1 10*3/uL — CL (ref 4.0–10.5)
nRBC: 0 % (ref 0.0–0.2)

## 2019-05-15 LAB — GLUCOSE, CAPILLARY
Glucose-Capillary: 120 mg/dL — ABNORMAL HIGH (ref 70–99)
Glucose-Capillary: 107 mg/dL — ABNORMAL HIGH (ref 70–99)
Glucose-Capillary: 106 mg/dL — ABNORMAL HIGH (ref 70–99)
Glucose-Capillary: 126 mg/dL — ABNORMAL HIGH (ref 70–99)

## 2019-05-15 LAB — HEMOGLOBIN A1C
Hgb A1c MFr Bld: 6.2 % — ABNORMAL HIGH (ref 4.8–5.6)
Mean Plasma Glucose: 131.24 mg/dL

## 2019-05-15 MED ORDER — EZETIMIBE 10 MG PO TABS
10.0000 mg | ORAL_TABLET | Freq: Every day | ORAL | Status: DC
  Administered 2019-05-15 – 2019-05-18 (×4): 10 mg via ORAL
  Filled 2019-05-15 (×4): qty 1

## 2019-05-15 MED ORDER — GABAPENTIN 400 MG PO CAPS
800.0000 mg | ORAL_CAPSULE | Freq: Three times a day (TID) | ORAL | Status: DC
  Administered 2019-05-15 – 2019-05-18 (×10): 800 mg via ORAL
  Filled 2019-05-15 (×10): qty 2

## 2019-05-15 MED ORDER — BUSPIRONE HCL 5 MG PO TABS
10.0000 mg | ORAL_TABLET | Freq: Three times a day (TID) | ORAL | Status: DC
  Administered 2019-05-15 – 2019-05-18 (×10): 10 mg via ORAL
  Filled 2019-05-15 (×10): qty 2

## 2019-05-15 MED ORDER — FUROSEMIDE 10 MG/ML IJ SOLN
40.0000 mg | Freq: Every day | INTRAMUSCULAR | Status: DC
  Administered 2019-05-15 – 2019-05-16 (×2): 40 mg via INTRAVENOUS
  Filled 2019-05-15: qty 4

## 2019-05-15 MED ORDER — BACLOFEN 10 MG PO TABS
10.0000 mg | ORAL_TABLET | ORAL | Status: DC | PRN
  Administered 2019-05-17: 10 mg via ORAL
  Filled 2019-05-15: qty 1

## 2019-05-15 MED ORDER — LACTULOSE 10 GM/15ML PO SOLN
10.0000 g | Freq: Two times a day (BID) | ORAL | Status: DC
  Administered 2019-05-15 – 2019-05-18 (×7): 10 g via ORAL
  Filled 2019-05-15 (×7): qty 30

## 2019-05-15 MED ORDER — DULOXETINE HCL 60 MG PO CPEP: 60.0000 mg | ORAL_CAPSULE | Freq: Every day | ORAL | Status: DC

## 2019-05-15 MED ORDER — BUPRENORPHINE HCL-NALOXONE HCL 2-0.5 MG SL SUBL
3.0000 | SUBLINGUAL_TABLET | Freq: Every day | SUBLINGUAL | Status: DC
  Administered 2019-05-15 – 2019-05-18 (×4): 3 via SUBLINGUAL
  Filled 2019-05-15 (×4): qty 3

## 2019-05-15 MED ORDER — ALPRAZOLAM 0.5 MG PO TABS
0.5000 mg | ORAL_TABLET | Freq: Once | ORAL | Status: AC
  Administered 2019-05-15: 0.5 mg via ORAL
  Filled 2019-05-15: qty 1

## 2019-05-15 MED ORDER — QUETIAPINE FUMARATE 100 MG PO TABS
100.0000 mg | ORAL_TABLET | Freq: Every day | ORAL | Status: DC
  Administered 2019-05-15 – 2019-05-17 (×3): 100 mg via ORAL
  Filled 2019-05-15 (×3): qty 1

## 2019-05-15 MED ORDER — DILTIAZEM HCL ER COATED BEADS 180 MG PO CP24: 300.0000 mg | ORAL_CAPSULE | Freq: Every day | ORAL | Status: DC

## 2019-05-15 MED ORDER — PRAVASTATIN SODIUM 10 MG PO TABS
10.0000 mg | ORAL_TABLET | Freq: Every day | ORAL | Status: DC
  Administered 2019-05-15 – 2019-05-18 (×4): 10 mg via ORAL
  Filled 2019-05-15 (×4): qty 1

## 2019-05-15 MED ORDER — POTASSIUM CHLORIDE CRYS ER 20 MEQ PO TBCR
40.0000 meq | EXTENDED_RELEASE_TABLET | Freq: Three times a day (TID) | ORAL | Status: AC
  Administered 2019-05-15 (×3): 40 meq via ORAL
  Filled 2019-05-15 (×3): qty 2

## 2019-05-15 MED ORDER — PROPRANOLOL HCL 20 MG PO TABS
20.0000 mg | ORAL_TABLET | Freq: Every day | ORAL | Status: DC
  Administered 2019-05-15 – 2019-05-17 (×3): 20 mg via ORAL
  Filled 2019-05-15 (×3): qty 1

## 2019-05-15 MED ORDER — DILTIAZEM HCL ER 90 MG PO CP12: 90.0000 mg | ORAL_CAPSULE | Freq: Every day | ORAL | Status: DC | PRN

## 2019-05-15 MED ORDER — MAGNESIUM SULFATE 2 GM/50ML IV SOLN
2.0000 g | Freq: Once | INTRAVENOUS | Status: AC
  Administered 2019-05-15: 2 g via INTRAVENOUS
  Filled 2019-05-15: qty 50

## 2019-05-15 MED ORDER — GUAIFENESIN-DM 100-10 MG/5ML PO SYRP
5.0000 mL | ORAL_SOLUTION | ORAL | Status: DC | PRN
  Administered 2019-05-15: 5 mL via ORAL
  Filled 2019-05-15: qty 5

## 2019-05-15 MED ORDER — IPRATROPIUM BROMIDE 0.02 % IN SOLN
0.5000 mg | Freq: Four times a day (QID) | RESPIRATORY_TRACT | Status: DC | PRN
  Administered 2019-05-15 – 2019-05-17 (×2): 0.5 mg via RESPIRATORY_TRACT
  Filled 2019-05-15 (×2): qty 2.5

## 2019-05-15 MED ORDER — PANTOPRAZOLE SODIUM 40 MG PO TBEC
40.0000 mg | DELAYED_RELEASE_TABLET | Freq: Every day | ORAL | Status: DC
  Administered 2019-05-15 – 2019-05-18 (×4): 40 mg via ORAL
  Filled 2019-05-15 (×4): qty 1

## 2019-05-15 NOTE — Progress Notes (Signed)
CRITICAL VALUE ALERT  Critical Value:  WBC: 1.1  Date & Time Notied:  5/7 @ 0614  Provider Notified: Humphrey Rolls  Orders Received/Actions taken: awaiting orders.

## 2019-05-15 NOTE — Care Management Important Message (Signed)
Important Message  Patient Details  Name: [name] MRN: [id number] Date of Birth: 04/15/1965   Medicare Important Message Given:  Yes     [name] 05/15/2019, 3:44 PM

## 2019-05-15 NOTE — Progress Notes (Signed)
PROGRESS NOTE    [name]  KVQ:[id number] DOB: [birth year] DOA: 05/14/2019 PCP: Dettinger, Fransisca Kaufmann, MD      Brief Narrative:  [name] is a 54 y.o. M with M0, OSA on CPAP, alcoholic cirrhosis, pancytopenia, DM, MD CHF who presented with PND.  The patient was recently admitted to the hospital for fluid overload in setting of congestive heart failure flare and cirrhosis.  He was diuresed 5L, then discharged due to "family emergency".  Since then he was at home, symptoms at baseline he thought,, adherent to medicines he reports (torsemide 3 times daily, occasional metolazone, inconsistent use), but he was not weighing himself daily, and over the last several days prior to admission he noticed new scrotal edema starting again, and finally the night of admission he woke with severe dyspnea, relieved with sitting up.  He thought he was having a "panic attack" so he came to the emergency room.  NP mildly elevated, hyponatremia and hypokalemia were present.  Pancytopenia was stable.  Chest x-ray showed mild edema and EKG was unchanged from baseline.  He was obviously grossly fluid overloaded on exam, with scrotal edema and lower extremity edema, and tachypnea, and so he was started on IV Lasix and the hospitalist service were asked to evaluate for admission.           Assessment & Plan:  Acute on chronic diastolic CHF EF 55 to 64% last month.  Presents with reported medication adherence but failure of diuretics and failure of daily weights.  Overnight put out 6 L with IV Lasix.  Creatinine stable, blood pressure slightly soft, potassium slightly better. -Continue furosemide, decrease to once daily -Continue potassium supplement -Strict ins and outs, daily weights, telemetry -Daily monitoring of renal function    Hypokalemia Slightly better. -Supplement magnesium -Continue potassium supplement   Alcoholic cirrhosis Still drinking 1 to 2 forty ounce beers per day, but  states this is cut down.  No signs of hepatic encephalopathy, GI bleed, SBP.   -Resume propranolol, lactulose  Gout flare This seems slightly better, received colchicine 1.8 mg yesterday -Resume home colchicine  Sleep apnea -CPAP at night  Morbid obesity BMI 49 with diabetes, sleep apnea -Continue PPI  Pancytopenia Platelets 40,000, WBC 1000, close to patient's baseline.  No signs of infection at this time.  Diabetes Glucose well controlled -Continue sliding scale corrections -Continue aspirin  NSAID use Regular NSAID use would not be advisable given his intractable fluid retention -Stop indomethacin  Chronic pain -Resume Subutex -Continue gabapentin, baclofen -Stop indomethacin  Hypertension Hyperlipidemia Blood pressure soft today -Continue Zetia, pravastatin -Continue furosemide -Hold diltiazem  Mood disorder -Stop duloxetine given cirrhosis -Continue BuSpar, Seroquel -Panic attack this morning, will treat with one-time Xanax        Disposition: Status is: Inpatient  Remains inpatient appropriate because: He still remains grossly fluid overloaded, dyspneic with exertion, and requiring ongoing IV Lasix   Dispo: The patient is from: Home              Anticipated d/c is to: Home              Anticipated d/c date is: 3 days              Patient currently is not medically stable to d/c.   Is agitating to go home today.  I suspect he will leave AMA either today or tomorrow, although my suspicion is he has several more days of IV Lasix to go, likely 4-6 more  liters of fluid, and that it would be prudent to keep him for 24 hours after transition to oral diuretics.            MDM: The below labs and imaging reports were reviewed and summarized above.  Medication management as above.  This is a severe exacerbation of his chronic disease.   DVT prophylaxis: SCDs due to thrombocytopenia Code Status: Full code Family Communication:      Consultants:     Procedures:     Antimicrobials:      Culture data:              Subjective: Patient is having panic attacks this morning again.  He is very anxious and hyperventilating.  He also has discomfort in his eyes from using the CPAP overnight (squinting, pain?/Irritation).  No fever, voimting, confusion, slowed mentation, encephalopathy, respiratory distress.  Still swollen, but slightly better.  Objective: Vitals:   05/14/19 2154 05/15/19 0500 05/15/19 0531 05/15/19 0539  BP: (!) 107/59  98/84   Pulse: 94  98   Resp: 17  16   Temp: 99.3 F (37.4 C)  98.8 F (37.1 C)   TempSrc: Oral  Oral   SpO2: 99%  100% 92%  Weight:  (!) 165.4 kg    Height:        Intake/Output Summary (Last 24 hours) at 05/15/2019 0853 Last data filed at 05/15/2019 0404 Gross per 24 hour  Intake 1000 ml  Output 5350 ml  Net -4350 ml   Filed Weights   05/14/19 1103 05/14/19 1729 05/15/19 0500  Weight: (!) 166.2 kg (!) 168.4 kg (!) 165.4 kg    Examination: General appearance: [place] adult male, alert and in no acute distress.  Being on the edge of the bed HEENT: Anicteric, conjunctiva pink, lids and lashes normal. No nasal deformity, discharge, epistaxis.  Lips moist, dentition in adequate repair, oropharynx moist, no oral lesions.   Skin: Warm and dry.  No jaundice.  No suspicious rashes or lesions. Cardiac: RRR, nl S1-S2, no murmurs appreciated.  Capillary refill is brisk.  JVP not visible.  1+ bilateral LE edema chronic brawny change.  Radial pulses 2+ and symmetric. Respiratory: Normal respiratory rate and rhythm.  CTAB without rales or wheezes.  Lung sounds diminished bilaterally. Abdomen: Abdomen soft.  No TTP or guarding.  Ventral hernia still noted.  Cannot assess hepatosplenomegaly.  There is likely some ascites, although this is difficult to assess given body habitus. MSK: Chronic gouty change of the bilateral third MCP joints, also the right ankle Neuro: Awake  and alert.  EOMI, moves all extremities. Speech fluent.    Psych: Sensorium intact and responding to questions, attention normal. Affect anxious.  Judgment and insight appear normal    Data Reviewed: I have personally reviewed following labs and imaging studies:  CBC: Recent Labs  Lab 05/14/19 0620 05/15/19 0429  WBC 1.0* 1.1*  NEUTROABS 0.5*  --   HGB 10.7* 10.7*  HCT 30.9* 32.8*  MCV 79.0* 82.6  PLT 40* 43*   Basic Metabolic Panel: Recent Labs  Lab 05/14/19 0620 05/15/19 0429  NA 127* 136  K 2.8* 2.9*  CL 87* 91*  CO2 29 31  GLUCOSE 132* 142*  BUN 15 11  CREATININE 0.87 0.67  CALCIUM 8.7* 9.0  MG 1.5*  --    GFR: Estimated Creatinine Clearance: 166.3 mL/min (by C-G formula based on SCr of 0.67 mg/dL). Liver Function Tests: No results for input(s): AST, ALT, ALKPHOS, BILITOT, PROT, ALBUMIN  in the last 168 hours. No results for input(s): LIPASE, AMYLASE in the last 168 hours. No results for input(s): AMMONIA in the last 168 hours. Coagulation Profile: No results for input(s): INR, PROTIME in the last 168 hours. Cardiac Enzymes: No results for input(s): CKTOTAL, CKMB, CKMBINDEX, TROPONINI in the last 168 hours. BNP (last 3 results) No results for input(s): PROBNP in the last 8760 hours. HbA1C: Recent Labs    05/15/19 0429  HGBA1C 6.2*   CBG: Recent Labs  Lab 05/14/19 1729 05/14/19 2031 05/15/19 0810  GLUCAP 77 109* 120*   Lipid Profile: No results for input(s): CHOL, HDL, LDLCALC, TRIG, CHOLHDL, LDLDIRECT in the last 72 hours. Thyroid Function Tests: No results for input(s): TSH, T4TOTAL, FREET4, T3FREE, THYROIDAB in the last 72 hours. Anemia Panel: No results for input(s): VITAMINB12, FOLATE, FERRITIN, TIBC, IRON, RETICCTPCT in the last 72 hours. Urine analysis:    Component Value Date/Time   APPEARANCEUR Clear 09/29/2018 1020   GLUCOSEU Negative 09/29/2018 1020   BILIRUBINUR Negative 09/29/2018 1020   PROTEINUR Negative 09/29/2018 1020    NITRITE Negative 09/29/2018 1020   LEUKOCYTESUR Negative 09/29/2018 1020   Sepsis Labs: @LABRCNTIP (procalcitonin:4,lacticacidven:4)  ) Recent Results (from the past 240 hour(s))  Respiratory Panel by RT PCR (Flu A&B, Covid) - Nasopharyngeal Swab     Status: None   Collection Time: 05/14/19  6:08 AM   Specimen: Nasopharyngeal Swab  Result Value Ref Range Status   SARS Coronavirus 2 by RT PCR NEGATIVE NEGATIVE Final    Comment: (NOTE) SARS-CoV-2 target nucleic acids are NOT DETECTED. The SARS-CoV-2 RNA is generally detectable in upper respiratoy specimens during the acute phase of infection. The lowest concentration of SARS-CoV-2 viral copies this assay can detect is 131 copies/mL. A negative result does not preclude SARS-Cov-2 infection and should not be used as the sole basis for treatment or other patient management decisions. A negative result may occur with  improper specimen collection/handling, submission of specimen other than nasopharyngeal swab, presence of viral mutation(s) within the areas targeted by this assay, and inadequate number of viral copies (<131 copies/mL). A negative result must be combined with clinical observations, patient history, and epidemiological information. The expected result is Negative. Fact Sheet for Patients:  PinkCheek.be Fact Sheet for Healthcare Providers:  GravelBags.it This test is not yet ap proved or cleared by the [place] FDA and  has been authorized for detection and/or diagnosis of SARS-CoV-2 by FDA under an Emergency Use Authorization (EUA). This EUA will remain  in effect (meaning this test can be used) for the duration of the COVID-19 declaration under Section 564(b)(1) of the Act, 21 U.S.C. section 360bbb-3(b)(1), unless the authorization is terminated or revoked sooner.    Influenza A by PCR NEGATIVE NEGATIVE Final   Influenza B by PCR NEGATIVE NEGATIVE Final     Comment: (NOTE) The Xpert Xpress SARS-CoV-2/FLU/RSV assay is intended as an aid in  the diagnosis of influenza from Nasopharyngeal swab specimens and  should not be used as a sole basis for treatment. Nasal washings and  aspirates are unacceptable for Xpert Xpress SARS-CoV-2/FLU/RSV  testing. Fact Sheet for Patients: PinkCheek.be Fact Sheet for Healthcare Providers: GravelBags.it This test is not yet approved or cleared by the [place] FDA and  has been authorized for detection and/or diagnosis of SARS-CoV-2 by  FDA under an Emergency Use Authorization (EUA). This EUA will remain  in effect (meaning this test can be used) for the duration of the  Covid-19 declaration under Section 564(b)(1)  of the Act, 21  U.S.C. section 360bbb-3(b)(1), unless the authorization is  terminated or revoked. Performed at [facility], [address], [place], [place] [address]          Radiology Studies: [facility] Chest [facility] 1 View  Result Date: 05/14/2019 CLINICAL DATA:  Shortness of breath. EXAM: PORTABLE CHEST 1 VIEW COMPARISON:  04/22/2019. FINDINGS: Mediastinum and hilar structures normal. Stable cardiomegaly. No focal infiltrate. No pleural effusion or pneumothorax. No acute bony abnormality. IMPRESSION: Stable cardiomegaly.  No acute pulmonary disease. Electronically Signed   By: Marcello Moores  Register   On: 05/14/2019 06:40        Scheduled Meds: . ALPRAZolam  0.5 mg Oral Once  . aspirin EC  81 mg Oral Daily  . buprenorphine-naloxone  3 tablet Sublingual Daily  . busPIRone  10 mg Oral TID  . colchicine  0.6 mg Oral Daily  . ezetimibe  10 mg Oral Daily  . furosemide  40 mg Intravenous Daily  . gabapentin  800 mg Oral TID  . insulin aspart  0-15 Units Subcutaneous TID WC  . insulin aspart  0-5 Units Subcutaneous QHS  . lactulose  10 g Oral BID  . nicotine  14 mg Transdermal Daily  . pantoprazole  40 mg Oral Daily  . potassium chloride   40 mEq Oral TID  . pravastatin  10 mg Oral Daily  . propranolol  20 mg Oral Q0600  . QUEtiapine  100 mg Oral QHS   Continuous Infusions: . magnesium sulfate bolus IVPB       LOS: 1 day    Time spent: 35 minutes    Edwin Dada, MD [facility] 05/15/2019, 8:53 AM     Please page though [place] or Epic secure chat:  For [employer], Adult nurse

## 2019-05-16 DIAGNOSIS — D696 Thrombocytopenia, unspecified: Secondary | ICD-10-CM

## 2019-05-16 DIAGNOSIS — E1159 Type 2 diabetes mellitus with other circulatory complications: Secondary | ICD-10-CM

## 2019-05-16 DIAGNOSIS — G4733 Obstructive sleep apnea (adult) (pediatric): Secondary | ICD-10-CM

## 2019-05-16 DIAGNOSIS — I5033 Acute on chronic diastolic (congestive) heart failure: Principal | ICD-10-CM

## 2019-05-16 DIAGNOSIS — K7030 Alcoholic cirrhosis of liver without ascites: Secondary | ICD-10-CM

## 2019-05-16 DIAGNOSIS — I10 Essential (primary) hypertension: Secondary | ICD-10-CM

## 2019-05-16 DIAGNOSIS — D61818 Other pancytopenia: Secondary | ICD-10-CM

## 2019-05-16 DIAGNOSIS — E1165 Type 2 diabetes mellitus with hyperglycemia: Secondary | ICD-10-CM

## 2019-05-16 DIAGNOSIS — E876 Hypokalemia: Secondary | ICD-10-CM

## 2019-05-16 LAB — CBC
HCT: 34 % — ABNORMAL LOW (ref 39.0–52.0)
Hemoglobin: 10.4 g/dL — ABNORMAL LOW (ref 13.0–17.0)
MCH: 26.4 pg (ref 26.0–34.0)
MCHC: 30.6 g/dL (ref 30.0–36.0)
MCV: 86.3 fL (ref 80.0–100.0)
Platelets: 44 10*3/uL — ABNORMAL LOW (ref 150–400)
RBC: 3.94 MIL/uL — ABNORMAL LOW (ref 4.22–5.81)
RDW: 16 % — ABNORMAL HIGH (ref 11.5–15.5)
WBC: 1.5 10*3/uL — ABNORMAL LOW (ref 4.0–10.5)
nRBC: 0 % (ref 0.0–0.2)

## 2019-05-16 LAB — BASIC METABOLIC PANEL
Anion gap: 10 (ref 5–15)
BUN: 8 mg/dL (ref 6–20)
CO2: 32 mmol/L (ref 22–32)
Calcium: 8.8 mg/dL — ABNORMAL LOW (ref 8.9–10.3)
Chloride: 94 mmol/L — ABNORMAL LOW (ref 98–111)
Creatinine, Ser: 0.68 mg/dL (ref 0.61–1.24)
GFR calc Af Amer: >60 mL/min (ref >60)
GFR calc non Af Amer: >60 mL/min (ref >60)
Glucose, Bld: 168 mg/dL — ABNORMAL HIGH (ref 70–99)
Potassium: 3.9 mmol/L (ref 3.5–5.1)
Sodium: 136 mmol/L (ref 135–145)

## 2019-05-16 LAB — GLUCOSE, CAPILLARY
Glucose-Capillary: 139 mg/dL — ABNORMAL HIGH (ref 70–99)
Glucose-Capillary: 113 mg/dL — ABNORMAL HIGH (ref 70–99)
Glucose-Capillary: 91 mg/dL (ref 70–99)
Glucose-Capillary: 115 mg/dL — ABNORMAL HIGH (ref 70–99)

## 2019-05-16 MED ORDER — FUROSEMIDE 10 MG/ML IJ SOLN
40.0000 mg | Freq: Two times a day (BID) | INTRAMUSCULAR | Status: DC
  Administered 2019-05-16 – 2019-05-17 (×2): 40 mg via INTRAVENOUS
  Filled 2019-05-16 (×2): qty 4

## 2019-05-16 MED ORDER — HEPARIN SODIUM (PORCINE) 5000 UNIT/ML IJ SOLN
5000.0000 [IU] | Freq: Three times a day (TID) | INTRAMUSCULAR | Status: DC
  Administered 2019-05-17: 5000 [IU] via SUBCUTANEOUS
  Filled 2019-05-16 (×2): qty 1

## 2019-05-16 NOTE — Progress Notes (Addendum)
PROGRESS NOTE    [name]  JQB:[id number] DOB: [birth year] DOA: 05/14/2019 PCP: Dettinger, Fransisca Kaufmann, MD    Brief Narrative:  Patient admitted to the hospital with working diagnosis of acute on chronic diastolic heart failure.  54 year old male with past medical history diastolic heart failure, type 2 diabetes mellitus, alcoholic cirrhosis, pancytopenia, morbid obesity and obstructive sleep apnea.  Recent hospitalization for removal.  At home on torsemide and metolazone.  He noticed several days of worsening scrotal edema, associated with dyspnea and PND.  On his initial physical examination blood pressure 143/100, heart rate 92, temperature 98, respiratory 20, oxygen saturation 99%, his lungs had no wheezing or rails, heart S1-S2, present rhythmic, no JVD, abdomen soft and nontender, positive umbilical hernia, marked bilateral extremity edema.  Scrotal edema. Sodium 127, potassium 2.8, chloride 87, bicarb 29, glucose 132, BUN 15, creatinine 0.87, magnesium 1.5, BNP 158, white count 1.0, hemoglobin 10.7, hematocrit 30.9, platelets 40.  SARS COVID-19 negative.  Chest radiograph with cardiomegaly, positive hilar vascular congestion.  EKG 92 bpm, normal axis, first-degree AV block sinus rhythm, Q waves V1 through V3, no ST segment or T wave changes.  Patient started on aggressive diuresis with IV furosemide, improved volume status but not yet back to baseline.   Assessment & Plan:   Principal Problem:   Acute on chronic diastolic CHF (congestive heart failure) (HCC) Active Problems:   T2DM (type 2 diabetes mellitus) (HCC)   OBESITY, MORBID   OSA (obstructive sleep apnea)   Hypertension associated with diabetes (HCC)   Cirrhosis (HCC)   Thrombocytopenia (HCC)   Pancytopenia (HCC)   Hypokalemia   1. Acute on chronic diastolic heart failure. Patient with improved symptoms but not yet back to baseline. Urine output over last 24 H is 2,900 ml, since admission fluid balance negative 8,105  ml. Blood pressure systolic 409 mmHg and renal function stable.   Will continue aggressive diuresis with IV furosemide, 40 mg IV bid, and follow volume status in am. Patient at home on torsemide 20 mg tid, and taking as needed metolazone. Likely will benefit from scheduled metolazone M-W-F, with a close follow up as outpatient.   2. HTN. His blood pressure has improved with diuresis, currently holding on diltiazem. Continue blood pressure monitoring and IV diuresis.   3. Hypokalemia/ metabolic alkalosis. Stable renal function with serum cr at 0,68, K up to 3,9 and serum bicarbonate at 32.  Will continue with IV furosemide and follow renal panel in am. Avoid hypotension and nephrotoxic medications.   3. Chronic alcoholic cirrhosis with pancytopenia. Wbc up to 1,5 with Hgb at 10.4, hct 34 and Plt at 44. INR is 1,1. Improved volume status. No clinical signs of encephalopathy.  Will need follow up as outpatient. Continue with lactulose and propranolol.   4. T2DM (Hgb A1c 6,2)/ dyslipidemia.  Fating glucose is 168 mg/dl. Will continue glucose cover and monitoring with insulin sliding scale. Patient is tolerating po well and requesting a non diabetic diet. At home patient taking metformin only as needed.  Continue with statin therapy and ezetimibe.    5. Gout flare. Discontinue cholchicine in the setting of pancytopenia.   6. Obesity class 3, obstructive sleep apnea, chronic pain syndrome and mood disorder. Continue with buspirone and seroquel. On suboxone, gabapentin, baclofen and hydromorphone. Continue PT, OT and out of bed to chair tid with meals.   Patient will need home health services at discharge.   Status is: Inpatient  Remains inpatient appropriate because:IV treatments appropriate due  to intensity of illness or inability to take PO   Dispo: The patient is from: Home              Anticipated d/c is to: Home              Anticipated d/c date is: 1 day              Patient currently  is not medically stable to d/c.        DVT prophylaxis: Sq heparin    Code Status:   full  Family Communication:  No family at the bedside      Subjective: Patient is feeling better but not yet back to baseline, continue to have lower extremity and scrotal edema, no chest pain, no nausea or vomiting, he is requesting a non diabetic diet.   Objective: Vitals:   05/15/19 0539 05/15/19 2110 05/16/19 0004 05/16/19 0547  BP:  136/82  136/81  Pulse:  86  (!) 103  Resp:  16 18 18   Temp:  98.9 F (37.2 C)  97.7 F (36.5 C)  TempSrc:  Oral  Oral  SpO2: 92% 97% 95% 95%  Weight:      Height:        Intake/Output Summary (Last 24 hours) at 05/16/2019 1343 Last data filed at 05/16/2019 1030 Gross per 24 hour  Intake 1200 ml  Output 3450 ml  Net -2250 ml   Filed Weights   05/14/19 1103 05/14/19 1729 05/15/19 0500  Weight: (!) 166.2 kg (!) 168.4 kg (!) 165.4 kg    Examination:   General: Not in pain or dyspnea, deconditioned  Neurology: Awake and alert, non focal  E ENT: no pallor, no icterus, oral mucosa moist. Short and wide neck Cardiovascular: No JVD. S1-S2 present, rhythmic, no gallops, rubs, or murmurs. +/++ bilateral pretibial lower extremity edema. Pulmonary: positive breath sounds bilaterally, no wheezing, rhonchi or rales on anterior auscultation.  Gastrointestinal. Abdomen protuberant with, no organomegaly, non tender, no rebound or guarding Skin. No rashes Musculoskeletal: no joint deformities     Data Reviewed: I have personally reviewed following labs and imaging studies  CBC: Recent Labs  Lab 05/14/19 0620 05/15/19 0429 05/16/19 0704  WBC 1.0* 1.1* 1.5*  NEUTROABS 0.5*  --   --   HGB 10.7* 10.7* 10.4*  HCT 30.9* 32.8* 34.0*  MCV 79.0* 82.6 86.3  PLT 40* 43* 44*   Basic Metabolic Panel: Recent Labs  Lab 05/14/19 0620 05/15/19 0429 05/16/19 0704  NA 127* 136 136  K 2.8* 2.9* 3.9  CL 87* 91* 94*  CO2 29 31 32  GLUCOSE 132* 142* 168*  BUN 15  11 8   CREATININE 0.87 0.67 0.68  CALCIUM 8.7* 9.0 8.8*  MG 1.5*  --   --    GFR: Estimated Creatinine Clearance: 166.3 mL/min (by C-G formula based on SCr of 0.68 mg/dL). Liver Function Tests: No results for input(s): AST, ALT, ALKPHOS, BILITOT, PROT, ALBUMIN in the last 168 hours. No results for input(s): LIPASE, AMYLASE in the last 168 hours. No results for input(s): AMMONIA in the last 168 hours. Coagulation Profile: No results for input(s): INR, PROTIME in the last 168 hours. Cardiac Enzymes: No results for input(s): CKTOTAL, CKMB, CKMBINDEX, TROPONINI in the last 168 hours. BNP (last 3 results) No results for input(s): PROBNP in the last 8760 hours. HbA1C: Recent Labs    05/15/19 0429  HGBA1C 6.2*   CBG: Recent Labs  Lab 05/15/19 1136 05/15/19 1608 05/15/19 2107 05/16/19  9480 05/16/19 1108  GLUCAP 107* 106* 126* 139* 113*   Lipid Profile: No results for input(s): CHOL, HDL, LDLCALC, TRIG, CHOLHDL, LDLDIRECT in the last 72 hours. Thyroid Function Tests: No results for input(s): TSH, T4TOTAL, FREET4, T3FREE, THYROIDAB in the last 72 hours. Anemia Panel: No results for input(s): VITAMINB12, FOLATE, FERRITIN, TIBC, IRON, RETICCTPCT in the last 72 hours.    Radiology Studies: I have reviewed all of the imaging during this hospital visit personally     Scheduled Meds: . aspirin EC  81 mg Oral Daily  . buprenorphine-naloxone  3 tablet Sublingual Daily  . busPIRone  10 mg Oral TID  . colchicine  0.6 mg Oral Daily  . ezetimibe  10 mg Oral Daily  . furosemide  40 mg Intravenous Daily  . gabapentin  800 mg Oral TID  . insulin aspart  0-15 Units Subcutaneous TID WC  . insulin aspart  0-5 Units Subcutaneous QHS  . lactulose  10 g Oral BID  . nicotine  14 mg Transdermal Daily  . pantoprazole  40 mg Oral Daily  . pravastatin  10 mg Oral Daily  . propranolol  20 mg Oral Q0600  . QUEtiapine  100 mg Oral QHS   Continuous Infusions:   LOS: 2 days         Dionel Archey Gerome Apley, MD

## 2019-05-17 LAB — GLUCOSE, CAPILLARY
Glucose-Capillary: 80 mg/dL (ref 70–99)
Glucose-Capillary: 122 mg/dL — ABNORMAL HIGH (ref 70–99)
Glucose-Capillary: 104 mg/dL — ABNORMAL HIGH (ref 70–99)
Glucose-Capillary: 128 mg/dL — ABNORMAL HIGH (ref 70–99)

## 2019-05-17 LAB — BASIC METABOLIC PANEL
Anion gap: 11 (ref 5–15)
BUN: 9 mg/dL (ref 6–20)
CO2: 31 mmol/L (ref 22–32)
Calcium: 9 mg/dL (ref 8.9–10.3)
Chloride: 96 mmol/L — ABNORMAL LOW (ref 98–111)
Creatinine, Ser: 0.78 mg/dL (ref 0.61–1.24)
GFR calc Af Amer: >60 mL/min (ref >60)
GFR calc non Af Amer: >60 mL/min (ref >60)
Glucose, Bld: 100 mg/dL — ABNORMAL HIGH (ref 70–99)
Potassium: 3.5 mmol/L (ref 3.5–5.1)
Sodium: 138 mmol/L (ref 135–145)

## 2019-05-17 MED ORDER — POTASSIUM CHLORIDE CRYS ER 20 MEQ PO TBCR: 40.0000 meq | EXTENDED_RELEASE_TABLET | Freq: Two times a day (BID) | ORAL | Status: DC

## 2019-05-17 MED ORDER — ALPRAZOLAM 1 MG PO TABS
1.0000 mg | ORAL_TABLET | Freq: Once | ORAL | Status: AC
  Administered 2019-05-17: 1 mg via ORAL
  Filled 2019-05-17: qty 1

## 2019-05-17 MED ORDER — POTASSIUM CHLORIDE CRYS ER 20 MEQ PO TBCR
40.0000 meq | EXTENDED_RELEASE_TABLET | Freq: Once | ORAL | Status: AC
  Administered 2019-05-17: 40 meq via ORAL
  Filled 2019-05-17: qty 2

## 2019-05-17 MED ORDER — METOPROLOL TARTRATE 25 MG PO TABS
25.0000 mg | ORAL_TABLET | Freq: Two times a day (BID) | ORAL | Status: DC
  Administered 2019-05-17 – 2019-05-18 (×3): 25 mg via ORAL
  Filled 2019-05-17 (×3): qty 1

## 2019-05-17 MED ORDER — DILTIAZEM HCL ER COATED BEADS 180 MG PO CP24
300.0000 mg | ORAL_CAPSULE | Freq: Every day | ORAL | Status: DC
  Administered 2019-05-17 – 2019-05-18 (×2): 300 mg via ORAL
  Filled 2019-05-17 (×2): qty 1

## 2019-05-17 MED ORDER — FUROSEMIDE 10 MG/ML IJ SOLN
20.0000 mg | Freq: Once | INTRAMUSCULAR | Status: AC
  Administered 2019-05-17: 20 mg via INTRAVENOUS
  Filled 2019-05-17: qty 2

## 2019-05-17 MED ORDER — DILTIAZEM HCL ER COATED BEADS 180 MG PO CP24: 300.0000 mg | ORAL_CAPSULE | Freq: Every day | ORAL | Status: DC

## 2019-05-17 MED ORDER — METOPROLOL TARTRATE 5 MG/5ML IV SOLN
5.0000 mg | Freq: Once | INTRAVENOUS | Status: AC
  Administered 2019-05-17: 5 mg via INTRAVENOUS
  Filled 2019-05-17: qty 5

## 2019-05-17 MED ORDER — METOPROLOL TARTRATE 5 MG/5ML IV SOLN: 5.0000 mg | INTRAVENOUS | Status: DC | PRN

## 2019-05-17 NOTE — Progress Notes (Addendum)
Patient has developed atrial fibrillation with rapid ventricular response. HR up to 150 bpm. Patient received metoprolol IV 5 mg and diltiazem 300 mg po with continue uncontrolled HR 90 to 120 bpm.  Will cancel discharge for today, will add metoprolol 25 mg po bid and discontinue propranolol. Continue telemetry monitoring.   Possible dc home in am if atrial fibrillation gets controlled.

## 2019-05-17 NOTE — Discharge Summary (Addendum)
Physician Discharge Summary  [name] SUO:[id number] DOB: May 20, 1965 DOA: 05/14/2019  PCP: Dettinger, Fransisca Kaufmann, MD  Admit date: 05/14/2019 Discharge date: 05/17/2019  Admitted From: Home  Disposition:  Home   Recommendations for Outpatient Follow-up and new medication changes:  1. Follow up with Dr. Warrick Parisian in 7 days.  2. Patient has been advised to take metolazone Monday-Wednesday-Friday. 3. Follow renal panel in 7 days.  4. Discontinue colchicine due to pancytopenia.   5. Discontinue propranolol and started on metoprolol 25 mg po bid for better atrial fibrillation rate control.   Home Health: yes   Equipment/Devices:no    Discharge Condition: stable  CODE STATUS: full  Diet recommendation: heart healthy and diabetic prudent.   Brief/Interim Summary: Patient admitted to the hospital with working diagnosis of acute on chronic diastolic heart failure.  54 year old male with past medical history diastolic heart failure, type 2 diabetes mellitus, alcoholic cirrhosis, pancytopenia, morbid obesity and obstructive sleep apnea.  Recent hospitalization for volume overload.  At home patient taking torsemide and as needed metolazone.  He noticed several days of worsening scrotal edema, associated with dyspnea and PND.  On his initial physical examination blood pressure 143/100, heart rate 92, temperature 98, respiratory rate 20, oxygen saturation 99%, his lungs had no wheezing or rales, heart S1-S2, present rhythmic, no JVD, abdomen soft and nontender, positive umbilical hernia, marked bilateral extremity edema and scrotal edema.  Sodium 127, potassium 2.8, chloride 87, bicarb 29, glucose 132, BUN 15, creatinine 0.87, magnesium 1.5, BNP 158, white count 1.0, hemoglobin 10.7, hematocrit 30.9, platelets 40.  SARS COVID-19 negative.  Chest radiograph with cardiomegaly, positive hilar vascular congestion.  EKG 92 bpm, normal axis, first-degree AV block sinus rhythm, Q waves V1 through V3, no ST  segment or T wave changes.  Patient started on aggressive diuresis with IV furosemide with improved volume status.  1.  Acute on chronic diastolic heart failure (present on admission).  Patient was admitted to the telemetry ward, he received aggressive diuresis with intravenous furosemide.  Negative fluid balance was achieved, -10.775 ml since admission, with significant improvement of his symptoms.  Echocardiography from April 2021 showed a preserved LV systolic function, 55 to 32% ejection fraction, preserved RV systolic function.  Patient will continue diuresis at home with torsemide 60 mg 2 times daily, he has been instructed to take metolazone Monday, Wednesday and Friday.  He will need close follow-up as an outpatient, home health services will be arranged.  2.  Hypertension.  Patient's blood pressure improved with diuresis.  At discharge he will continue diltiazem.  3.  Hyponatremia, hypokalemia, metabolic alkalosis.  Patient received diuresis with intravenous furosemide, his electrolytes were corrected.  At discharge sodium 138, potassium 3.5, chloride 96, bicarb 31, glucose 100, BUN 9, creatinine 0.78.  4.  Chronic alcoholic cirrhosis with pancytopenia.  Patient had cell count closely monitored, at discharge his white count was 1.5, hemoglobin 10.4, hematocrit 34, platelets 44.  His INR 1.1.    No signs of decompensated liver disease, patient will continue lactulose and propranolol.  Colchicine was discontinued due to risk of worsening leukopenia.  5.  Type 2 diabetes mellitus, hemoglobin A1c 6.2, dyslipidemia.  Patient's glucose remained stable, he received insulin sliding scale for glucose coverage and monitoring.  Continue pravastatin and ezetimibe.   6.  Gout flare.  Clinically improved, patient will continue as needed nonsteroidal anti-inflammatory agents.  Hold colchicine in the setting of pancytopenia.  7.  Obesity class III, obstructive sleep apnea, chronic pain syndrome,  mood  disorder.  Patient will continue buspirone, Seroquel, Suboxone, gabapentin, baclofen, and hydromorphone.  Continue home health services at home.  Follow-up as an outpatient within 7 days, consider addressing polypharmacy.  8. Paroxysmal atrial fibrillation with RVR. During hospitalization patient converted to atrial fibrillation with RVR, resumed on diltiazem for rate control. Patient not on anticoagulation due to thrombocytopenia and history of upper gastrointestinal bleed.   Propranolol has been changed to metoprolol for better rate control.    Discharge Diagnoses:  Principal Problem:   Acute on chronic diastolic CHF (congestive heart failure) (HCC) Active Problems:   T2DM (type 2 diabetes mellitus) (HCC)   OBESITY, MORBID   OSA (obstructive sleep apnea)   Hypertension associated with diabetes ([place])   Cirrhosis ([place])   Thrombocytopenia ([place])   Pancytopenia (HCC)   Hypokalemia    Discharge Instructions   Allergies as of 05/18/2019      Reactions   Allopurinol Other (See Comments)   Joint pain worse    Oxycodone Base Itching   Prozac [fluoxetine Hcl] Itching      Medication List    STOP taking these medications   colchicine 0.6 MG tablet   propranolol 20 MG tablet Commonly known as: INDERAL     TAKE these medications   aspirin EC 81 MG tablet Take 81 mg by mouth daily.   baclofen 10 MG tablet Commonly known as: LIORESAL TAKE 1 TABLET BY MOUTH THREE TIMES DAILY What changed:   when to take this  reasons to take this   betamethasone dipropionate 0.05 % cream APPLY TO AFFECTED AREA TWICE A DAY What changed:   how much to take  how to take this  when to take this  reasons to take this  additional instructions   buprenorphine 2 MG Subl SL tablet Commonly known as: SUBUTEX Place 2 mg under the tongue 3 (three) times daily.   busPIRone 10 MG tablet Commonly known as: BUSPAR Take 1 tablet (10 mg total) by mouth 3 (three) times daily.   celecoxib 100  MG capsule Commonly known as: CeleBREX Take 1 capsule (100 mg total) by mouth 2 (two) times daily. What changed: when to take this   Constulose 10 GM/15ML solution Generic drug: lactulose take TWO tablespoonsful (30 mls) BY MOUTH TWICE DAILY What changed: See the new instructions.   diltiazem 300 MG 24 hr capsule Commonly known as: Cartia XT TAKE ONE CAPSULE BY MOUTH EVERY DAY What changed:   how much to take  how to take this  when to take this   diltiazem 90 MG 12 hr capsule Commonly known as: CARDIZEM SR Take 1 capsule (90 mg total) by mouth daily as needed (palpitations).   DULoxetine 60 MG capsule Commonly known as: CYMBALTA Take 60 mg by mouth at bedtime.   esomeprazole 40 MG capsule Commonly known as: NEXIUM Take 1 capsule (40 mg total) by mouth daily.   ezetimibe 10 MG tablet Commonly known as: ZETIA TAKE 1 TABLET BY MOUTH EVERY DAY   fluticasone 50 MCG/ACT nasal spray Commonly known as: FLONASE USE TWO SPRAYS IN EACH NOSTRIL EVERY DAY AS NEEDED What changed: See the new instructions.   folic acid 1 MG tablet Commonly known as: FOLVITE TAKE ONE TABLET BY MOUTH ONCE DAILY - EMERGENCY REFILL FAXED DR. What changed: See the new instructions.   gabapentin 800 MG tablet Commonly known as: NEURONTIN Take 1 tablet (800 mg total) by mouth 3 (three) times daily.   HYDROmorphone 4 MG tablet Commonly known  as: DILAUDID Take 4 mg by mouth every 4 (four) hours as needed for moderate pain or severe pain.   indomethacin 50 MG capsule Commonly known as: INDOCIN Take 50 mg by mouth daily as needed (gout).   ipratropium 0.02 % nebulizer solution Commonly known as: ATROVENT Take 0.5 mg by nebulization every 6 (six) hours as needed for wheezing or shortness of breath.   metolazone 2.5 MG tablet Commonly known as: ZAROXOLYN Take 1 tablet (2.5 mg total) by mouth 3 (three) times a week.   metoprolol tartrate 25 MG tablet Commonly known as: LOPRESSOR Take 1 tablet  (25 mg total) by mouth 2 (two) times daily.   MULTI COMPLETE PO Take 1 tablet by mouth daily.   Muscle Rub 10-15 % Crea Apply 1 application topically 3 (three) times daily as needed for muscle pain. For muscle pain   neomycin-bacitracin-polymyxin ointment Commonly known as: NEOSPORIN Apply 1 application topically every 12 (twelve) hours as needed for wound care. Reported on 06/01/2015   potassium chloride SA 20 MEQ tablet Commonly known as: KLOR-CON Take 2 tablets (40 mEq total) by mouth 2 (two) times daily.   pravastatin 10 MG tablet Commonly known as: PRAVACHOL Take 1 tablet (10 mg total) by mouth daily.   QUEtiapine 100 MG tablet Commonly known as: SEROquel Take 1 tablet (100 mg total) by mouth at bedtime.   torsemide 20 MG tablet Commonly known as: DEMADEX Take 3 tabs twice daily What changed:   how much to take  how to take this  when to take this   Voltaren 1 % Gel Generic drug: diclofenac Sodium Apply 4 g topically 4 (four) times daily.       Allergies  Allergen Reactions  . Allopurinol Other (See Comments)    Joint pain worse   . Oxycodone Base Itching  . Prozac [Fluoxetine Hcl] Itching      Procedures/Studies: DG Chest Port 1 View  Result Date: 05/14/2019 CLINICAL DATA:  Shortness of breath. EXAM: PORTABLE CHEST 1 VIEW COMPARISON:  04/22/2019. FINDINGS: Mediastinum and hilar structures normal. Stable cardiomegaly. No focal infiltrate. No pleural effusion or pneumothorax. No acute bony abnormality. IMPRESSION: Stable cardiomegaly.  No acute pulmonary disease. Electronically Signed   By: Marcello Moores  Register   On: 05/14/2019 06:40   DG Chest Port 1 View  Result Date: 04/22/2019 CLINICAL DATA:  Testicular swelling, history of CHF and COPD EXAM: PORTABLE CHEST 1 VIEW COMPARISON:  04/13/2019 FINDINGS: Single frontal view of the chest demonstrates a stable cardiac silhouette. Chronic central vascular congestion without airspace disease, effusion, or  pneumothorax. IMPRESSION: 1. Stable exam, no acute process. Electronically Signed   By: Randa Ngo M.D.   On: 04/22/2019 02:21   ECHOCARDIOGRAM COMPLETE  Result Date: 04/22/2019    ECHOCARDIOGRAM REPORT   Patient Name:   [name] Date of Exam: 04/22/2019 Medical Rec #:  [id number]        Height:       71.5 in Accession #:    [id number]       Weight:       370.0 lb Date of Birth:  01-Feb-1965        BSA:          2.753 m Patient Age:    33 years         BP:           92/69 mmHg Patient Gender: M  HR:           106 bpm. Exam Location:  [facility] Procedure: 2D Echo Indications:    CHF-Acute Diastolic 354.65 / K81.27  History:        Patient has prior history of Echocardiogram examinations, most                 recent 02/13/2012. Arrythmias:Atrial Fibrillation; Risk                 Factors:Diabetes, Dyslipidemia, Hypertension and Non-Smoker.                 Scrotal edema.  Sonographer:    Leavy Cella RDCS (AE) Referring Phys: [id number] JAN A [place]  1. Left ventricular ejection fraction, by estimation, is 55 to 60%. The left ventricle has normal function. The left ventricle has no regional wall motion abnormalities. There is mild left ventricular hypertrophy. Left ventricular diastolic parameters are indeterminate.  2. Right ventricular systolic function is normal. The right ventricular size is normal. Tricuspid regurgitation signal is inadequate for assessing PA pressure.  3. Left atrial size was mildly dilated.  4. The mitral valve is grossly normal. Trivial mitral valve regurgitation.  5. The aortic valve is tricuspid. Aortic valve regurgitation is not visualized.  6. The inferior vena cava is normal in size with greater than 50% respiratory variability, suggesting right atrial pressure of 3 mmHg. FINDINGS  Left Ventricle: Left ventricular ejection fraction, by estimation, is 55 to 60%. The left ventricle has normal function. The left ventricle has no regional wall motion  abnormalities. Definity contrast agent was given IV to delineate the left ventricular  endocardial borders. The left ventricular internal cavity size was normal in size. There is mild left ventricular hypertrophy. Left ventricular diastolic parameters are indeterminate. Right Ventricle: The right ventricular size is normal. No increase in right ventricular wall thickness. Right ventricular systolic function is normal. Tricuspid regurgitation signal is inadequate for assessing PA pressure. Left Atrium: Left atrial size was mildly dilated. Right Atrium: Right atrial size was normal in size. Pericardium: There is no evidence of pericardial effusion. Presence of pericardial fat pad. Mitral Valve: The mitral valve is grossly normal. Trivial mitral valve regurgitation. Tricuspid Valve: The tricuspid valve is grossly normal. Tricuspid valve regurgitation is trivial. Aortic Valve: The aortic valve is tricuspid. Aortic valve regurgitation is not visualized. Mild aortic valve annular calcification. Pulmonic Valve: The pulmonic valve was grossly normal. Pulmonic valve regurgitation is trivial. Aorta: The aortic root is normal in size and structure. Venous: The inferior vena cava is normal in size with greater than 50% respiratory variability, suggesting right atrial pressure of 3 mmHg. IAS/Shunts: No atrial level shunt detected by color flow Doppler.  LEFT VENTRICLE PLAX 2D LVIDd:         5.08 cm  Diastology LVIDs:         4.06 cm  LV e' lateral:   13.10 cm/s LV PW:         1.45 cm  LV E/e' lateral: 6.7 LV IVS:        1.29 cm  LV e' medial:    10.00 cm/s LVOT diam:     2.30 cm  LV E/e' medial:  8.8 LVOT Area:     4.15 cm  RIGHT VENTRICLE RV S prime:     16.80 cm/s TAPSE (M-mode): 1.8 cm LEFT ATRIUM             Index LA diam:  4.60 cm 1.67 cm/m LA Vol (A2C):   68.8 ml 24.99 ml/m LA Vol (A4C):   97.3 ml 35.34 ml/m LA Biplane Vol: 84.0 ml 30.51 ml/m   AORTA Ao Root diam: 3.35 cm MITRAL VALVE MV Area (PHT): 3.05 cm     SHUNTS MV Decel Time: 249 msec    Systemic Diam: 2.30 cm MV E velocity: 87.80 cm/s MV A velocity: 32.60 cm/s MV E/A ratio:  2.69 Rozann Lesches MD Electronically signed by Rozann Lesches MD Signature Date/Time: 04/22/2019/3:38:09 PM    Final    US SCROTUM DOPPLER  Result Date: 04/22/2019 CLINICAL DATA:  Scrotal edema for 2 weeks EXAM: SCROTAL ULTRASOUND DOPPLER ULTRASOUND OF THE TESTICLES TECHNIQUE: Complete ultrasound examination of the testicles, epididymis, and other scrotal structures was performed. Color and spectral Doppler ultrasound were also utilized to evaluate blood flow to the testicles. COMPARISON:  None. FINDINGS: Right testicle Measurements: 3.7 x 2.2 x 2.7 cm. Homogeneous echotexture. No mass lesion. Left testicle Measurements: 3.8 x 3.0 x 2.6 cm. Homogeneous echotexture. No mass lesion. Right epididymis:  Normal in size and appearance. Left epididymis:  Normal in size and appearance. Hydrocele: Bilateral hydroceles which are small to moderate. LEFT hydrocele larger than RIGHT.None visualized. Varicocele:  None visualized Pulsed Doppler interrogation of both testes demonstrates normal low resistance arterial and venous waveforms bilaterally. Other: Scrotal edema. IMPRESSION: 1. Normal testicles with normal color Doppler flow. 2. Bilateral mild-to-moderate hydroceles larger on the LEFT. 3. Scrotal wall edema. Electronically Signed   By: Suzy Bouchard M.D.   On: 04/22/2019 12:23        Subjective: Patient is feeling better, dyspnea and edema continue to improve, no chest pain, no nausea or vomiting.  Discharge Exam: Vitals:   05/16/19 2300 05/17/19 0622  BP:  (!) 147/92  Pulse:  85  Resp:  18  Temp:    SpO2: 95% 99%   Vitals:   05/16/19 1350 05/16/19 2043 05/16/19 2300 05/17/19 0622  BP: 115/80 113/70  (!) 147/92  Pulse: 85 85  85  Resp:  16  18  Temp: 98.3 F (36.8 C) 98.6 F (37 C)    TempSrc: Oral Oral  Axillary  SpO2: 97% 99% 95% 99%  Weight:      Height:         General: Not in pain or dyspnea.  Neurology: Awake and alert, non focal  E ENT: no pallor, no icterus, oral mucosa moist Cardiovascular: No JVD. S1-S2 present, rhythmic, no gallops, rubs, or murmurs. trace lower extremity edema. Pulmonary:  positive breath sounds bilaterally,with no wheezing, rhonchi or rales. Gastrointestinal. Abdomen protuberant no organomegaly, non tender, no rebound or guarding Skin. No rashes Musculoskeletal: no joint deformities Improving scrotal edema.   The results of significant diagnostics from this hospitalization (including imaging, microbiology, ancillary and laboratory) are listed below for reference.     Microbiology: Recent Results (from the past 240 hour(s))  Respiratory Panel by RT PCR (Flu A&B, Covid) - Nasopharyngeal Swab     Status: None   Collection Time: 05/14/19  6:08 AM   Specimen: Nasopharyngeal Swab  Result Value Ref Range Status   SARS Coronavirus 2 by RT PCR NEGATIVE NEGATIVE Final    Comment: (NOTE) SARS-CoV-2 target nucleic acids are NOT DETECTED. The SARS-CoV-2 RNA is generally detectable in upper respiratoy specimens during the acute phase of infection. The lowest concentration of SARS-CoV-2 viral copies this assay can detect is 131 copies/mL. A negative result does not preclude SARS-Cov-2 infection and should not be  used as the sole basis for treatment or other patient management decisions. A negative result may occur with  improper specimen collection/handling, submission of specimen other than nasopharyngeal swab, presence of viral mutation(s) within the areas targeted by this assay, and inadequate number of viral copies (<131 copies/mL). A negative result must be combined with clinical observations, patient history, and epidemiological information. The expected result is Negative. Fact Sheet for Patients:  PinkCheek.be Fact Sheet for Healthcare Providers:   GravelBags.it This test is not yet ap proved or cleared by the [place] FDA and  has been authorized for detection and/or diagnosis of SARS-CoV-2 by FDA under an Emergency Use Authorization (EUA). This EUA will remain  in effect (meaning this test can be used) for the duration of the COVID-19 declaration under Section 564(b)(1) of the Act, 21 U.S.C. section 360bbb-3(b)(1), unless the authorization is terminated or revoked sooner.    Influenza A by PCR NEGATIVE NEGATIVE Final   Influenza B by PCR NEGATIVE NEGATIVE Final    Comment: (NOTE) The Xpert Xpress SARS-CoV-2/FLU/RSV assay is intended as an aid in  the diagnosis of influenza from Nasopharyngeal swab specimens and  should not be used as a sole basis for treatment. Nasal washings and  aspirates are unacceptable for Xpert Xpress SARS-CoV-2/FLU/RSV  testing. Fact Sheet for Patients: PinkCheek.be Fact Sheet for Healthcare Providers: GravelBags.it This test is not yet approved or cleared by the [place] FDA and  has been authorized for detection and/or diagnosis of SARS-CoV-2 by  FDA under an Emergency Use Authorization (EUA). This EUA will remain  in effect (meaning this test can be used) for the duration of the  Covid-19 declaration under Section 564(b)(1) of the Act, 21  U.S.C. section 360bbb-3(b)(1), unless the authorization is  terminated or revoked. Performed at [facility], [address] [place], [place] [address]      Labs: BNP (last 3 results) Recent Labs    04/22/19 0210 05/14/19 0620  BNP 114.0* 765.4*   Basic Metabolic Panel: Recent Labs  Lab 05/14/19 0620 05/15/19 0429 05/16/19 0704  NA 127* 136 136  K 2.8* 2.9* 3.9  CL 87* 91* 94*  CO2 29 31 32  GLUCOSE 132* 142* 168*  BUN 15 11 8   CREATININE 0.87 0.67 0.68  CALCIUM 8.7* 9.0 8.8*  MG 1.5*  --   --    Liver Function Tests: No results for input(s):  AST, ALT, ALKPHOS, BILITOT, PROT, ALBUMIN in the last 168 hours. No results for input(s): LIPASE, AMYLASE in the last 168 hours. No results for input(s): AMMONIA in the last 168 hours. CBC: Recent Labs  Lab 05/14/19 0620 05/15/19 0429 05/16/19 0704  WBC 1.0* 1.1* 1.5*  NEUTROABS 0.5*  --   --   HGB 10.7* 10.7* 10.4*  HCT 30.9* 32.8* 34.0*  MCV 79.0* 82.6 86.3  PLT 40* 43* 44*   Cardiac Enzymes: No results for input(s): CKTOTAL, CKMB, CKMBINDEX, TROPONINI in the last 168 hours. BNP: Invalid input(s): POCBNP CBG: Recent Labs  Lab 05/16/19 0723 05/16/19 1108 05/16/19 1610 05/16/19 2040 05/17/19 0713  GLUCAP 139* 113* 91 115* 80   D-Dimer No results for input(s): DDIMER in the last 72 hours. Hgb A1c Recent Labs    05/15/19 0429  HGBA1C 6.2*   Lipid Profile No results for input(s): CHOL, HDL, LDLCALC, TRIG, CHOLHDL, LDLDIRECT in the last 72 hours. Thyroid function studies No results for input(s): TSH, T4TOTAL, T3FREE, THYROIDAB in the last 72 hours.  Invalid input(s): FREET3 Anemia work up  No results for input(s): VITAMINB12, FOLATE, FERRITIN, TIBC, IRON, RETICCTPCT in the last 72 hours. Urinalysis    Component Value Date/Time   APPEARANCEUR Clear 09/29/2018 1020   GLUCOSEU Negative 09/29/2018 1020   BILIRUBINUR Negative 09/29/2018 1020   PROTEINUR Negative 09/29/2018 1020   NITRITE Negative 09/29/2018 1020   LEUKOCYTESUR Negative 09/29/2018 1020   Sepsis Labs Invalid input(s): PROCALCITONIN,  WBC,  LACTICIDVEN Microbiology Recent Results (from the past 240 hour(s))  Respiratory Panel by RT PCR (Flu A&B, Covid) - Nasopharyngeal Swab     Status: None   Collection Time: 05/14/19  6:08 AM   Specimen: Nasopharyngeal Swab  Result Value Ref Range Status   SARS Coronavirus 2 by RT PCR NEGATIVE NEGATIVE Final    Comment: (NOTE) SARS-CoV-2 target nucleic acids are NOT DETECTED. The SARS-CoV-2 RNA is generally detectable in upper respiratoy specimens during the  acute phase of infection. The lowest concentration of SARS-CoV-2 viral copies this assay can detect is 131 copies/mL. A negative result does not preclude SARS-Cov-2 infection and should not be used as the sole basis for treatment or other patient management decisions. A negative result may occur with  improper specimen collection/handling, submission of specimen other than nasopharyngeal swab, presence of viral mutation(s) within the areas targeted by this assay, and inadequate number of viral copies (<131 copies/mL). A negative result must be combined with clinical observations, patient history, and epidemiological information. The expected result is Negative. Fact Sheet for Patients:  PinkCheek.be Fact Sheet for Healthcare Providers:  GravelBags.it This test is not yet ap proved or cleared by the [place] FDA and  has been authorized for detection and/or diagnosis of SARS-CoV-2 by FDA under an Emergency Use Authorization (EUA). This EUA will remain  in effect (meaning this test can be used) for the duration of the COVID-19 declaration under Section 564(b)(1) of the Act, 21 U.S.C. section 360bbb-3(b)(1), unless the authorization is terminated or revoked sooner.    Influenza A by PCR NEGATIVE NEGATIVE Final   Influenza B by PCR NEGATIVE NEGATIVE Final    Comment: (NOTE) The Xpert Xpress SARS-CoV-2/FLU/RSV assay is intended as an aid in  the diagnosis of influenza from Nasopharyngeal swab specimens and  should not be used as a sole basis for treatment. Nasal washings and  aspirates are unacceptable for Xpert Xpress SARS-CoV-2/FLU/RSV  testing. Fact Sheet for Patients: PinkCheek.be Fact Sheet for Healthcare Providers: GravelBags.it This test is not yet approved or cleared by the [place] FDA and  has been authorized for detection and/or diagnosis of SARS-CoV-2  by  FDA under an Emergency Use Authorization (EUA). This EUA will remain  in effect (meaning this test can be used) for the duration of the  Covid-19 declaration under Section 564(b)(1) of the Act, 21  U.S.C. section 360bbb-3(b)(1), unless the authorization is  terminated or revoked. Performed at [facility], [address], [place], [place] [address]      Time coordinating discharge: 45 minutes  SIGNED:   Tawni Millers, MD  [facility] 05/17/2019, 8:00 AM

## 2019-05-18 ENCOUNTER — Telehealth: Payer: Self-pay | Admitting: *Deleted

## 2019-05-18 DIAGNOSIS — G43009 Migraine without aura, not intractable, without status migrainosus: Secondary | ICD-10-CM | POA: Insufficient documentation

## 2019-05-18 LAB — BASIC METABOLIC PANEL
Anion gap: 11 (ref 5–15)
BUN: 12 mg/dL (ref 6–20)
CO2: 28 mmol/L (ref 22–32)
Calcium: 8.6 mg/dL — ABNORMAL LOW (ref 8.9–10.3)
Chloride: 96 mmol/L — ABNORMAL LOW (ref 98–111)
Creatinine, Ser: 0.9 mg/dL (ref 0.61–1.24)
GFR calc Af Amer: >60 mL/min (ref >60)
GFR calc non Af Amer: >60 mL/min (ref >60)
Glucose, Bld: 109 mg/dL — ABNORMAL HIGH (ref 70–99)
Potassium: 3.8 mmol/L (ref 3.5–5.1)
Sodium: 135 mmol/L (ref 135–145)

## 2019-05-18 LAB — GLUCOSE, CAPILLARY: Glucose-Capillary: 92 mg/dL (ref 70–99)

## 2019-05-18 MED ORDER — METOPROLOL TARTRATE 25 MG PO TABS: 25.0000 mg | ORAL_TABLET | Freq: Two times a day (BID) | ORAL | 0 refills | Status: DC

## 2019-05-18 NOTE — Telephone Encounter (Signed)
TRANSITIONAL CARE MANAGEMENT TELEPHONE OUTREACH NOTE   Contact Date: 05/18/2019 Contacted By: Zannie Cove, LPN    DISCHARGE INFORMATION Date of Discharge:05/18/2019 Discharge Facility: [facility] Discharge Diagnosis:acute on chronic diastolic congestive heart failure    Outpatient Follow Up Recommendations (copied from discharge summary) Recommendations for Outpatient Follow-up and new medication changes:  1. Follow up with Dr. Warrick Parisian in 7 days.  2. Patient has been advised to take metolazone Monday-Wednesday-Friday. 3. Follow renal panel in 7 days.  4. Discontinue colchicine due to pancytopenia.   5. Discontinue propranolol and started on metoprolol 25 mg po bid for better atrial fibrillation rate control.  Yoshiharu Brassell Sleeth is a male primary care patient of Dettinger, Fransisca Kaufmann, MD. An outgoing telephone call was made today and I spoke with patient.  Mr. Losee condition(s) and treatment(s) were discussed. An opportunity to ask questions was provided and all were answered or forwarded as appropriate.    ACTIVITIES OF DAILY LIVING  Drayven D Koeneman lives with their family and he can perform ADLs independently. his primary caregiver is Himself. he is able to depend on his primary caregiver(s) for consistent help. Transportation to appointments, to pick up medications, and to run errands is not a problem.  (Consider referral to [place] if transportation or a consistent caregiver is a problem)   Fall Risk Fall Risk  02/17/2018 09/11/2017  Falls in the past year? 0 No  Number falls in past yr: - -  Injury with Fall? - -  Risk for fall due to : - -    medium [place] Modifications/Assistive Devices Wheelchair: No Cane: No Ramp: No Bedside Toilet: No Hospital Bed:  No Other: crutches   [place] he is not receiving home health n/a services.     MEDICATION RECONCILIATION  Mr. Manna has been able to pick-up all prescribed  discharge medications from the pharmacy.   A post discharge medication reconciliation was performed and the complete medication list was reviewed with the patient/caregiver and is current as of 05/18/2019. Changes highlighted below.  Discontinued Medications  Discontinue colchicine due to pancytopenia.   Discontinue propranolol and started on metoprolol 25 mg po bid for better atrial fibrillation rate control.  Current Medication List Allergies as of 05/18/2019      Reactions   Allopurinol Other (See Comments)   Joint pain worse    Oxycodone Base Itching   Prozac [fluoxetine Hcl] Itching      Medication List       Accurate as of May 18, 2019  1:39 PM. If you have any questions, ask your nurse or doctor.        aspirin EC 81 MG tablet Take 81 mg by mouth daily.   baclofen 10 MG tablet Commonly known as: LIORESAL TAKE 1 TABLET BY MOUTH THREE TIMES DAILY What changed:   when to take this  reasons to take this   betamethasone dipropionate 0.05 % cream APPLY TO AFFECTED AREA TWICE A DAY What changed:   how much to take  how to take this  when to take this  reasons to take this  additional instructions   buprenorphine 2 MG Subl SL tablet Commonly known as: SUBUTEX Place 2 mg under the tongue 3 (three) times daily.   busPIRone 10 MG tablet Commonly known as: BUSPAR Take 1 tablet (10 mg total) by mouth 3 (three) times daily.   celecoxib 100 MG capsule Commonly known as: CeleBREX Take 1  capsule (100 mg total) by mouth 2 (two) times daily. What changed: when to take this   Constulose 10 GM/15ML solution Generic drug: lactulose take TWO tablespoonsful (30 mls) BY MOUTH TWICE DAILY What changed: See the new instructions.   diltiazem 300 MG 24 hr capsule Commonly known as: Cartia XT TAKE ONE CAPSULE BY MOUTH EVERY DAY What changed:   how much to take  how to take this  when to take this   diltiazem 90 MG 12 hr capsule Commonly known as: CARDIZEM  SR Take 1 capsule (90 mg total) by mouth daily as needed (palpitations).   DULoxetine 60 MG capsule Commonly known as: CYMBALTA Take 60 mg by mouth at bedtime.   esomeprazole 40 MG capsule Commonly known as: NEXIUM Take 1 capsule (40 mg total) by mouth daily.   ezetimibe 10 MG tablet Commonly known as: ZETIA TAKE 1 TABLET BY MOUTH EVERY DAY   fluticasone 50 MCG/ACT nasal spray Commonly known as: FLONASE USE TWO SPRAYS IN EACH NOSTRIL EVERY DAY AS NEEDED What changed: See the new instructions.   folic acid 1 MG tablet Commonly known as: FOLVITE TAKE ONE TABLET BY MOUTH ONCE DAILY - EMERGENCY REFILL FAXED DR. What changed: See the new instructions.   gabapentin 800 MG tablet Commonly known as: NEURONTIN Take 1 tablet (800 mg total) by mouth 3 (three) times daily.   HYDROmorphone 4 MG tablet Commonly known as: DILAUDID Take 4 mg by mouth every 4 (four) hours as needed for moderate pain or severe pain.   indomethacin 50 MG capsule Commonly known as: INDOCIN Take 50 mg by mouth daily as needed (gout).   ipratropium 0.02 % nebulizer solution Commonly known as: ATROVENT Take 0.5 mg by nebulization every 6 (six) hours as needed for wheezing or shortness of breath.   metolazone 2.5 MG tablet Commonly known as: ZAROXOLYN Take 1 tablet (2.5 mg total) by mouth 3 (three) times a week.   metoprolol tartrate 25 MG tablet Commonly known as: LOPRESSOR Take 1 tablet (25 mg total) by mouth 2 (two) times daily.   MULTI COMPLETE PO Take 1 tablet by mouth daily.   Muscle Rub 10-15 % Crea Apply 1 application topically 3 (three) times daily as needed for muscle pain. For muscle pain   neomycin-bacitracin-polymyxin ointment Commonly known as: NEOSPORIN Apply 1 application topically every 12 (twelve) hours as needed for wound care. Reported on 06/01/2015   potassium chloride SA 20 MEQ tablet Commonly known as: KLOR-CON Take 2 tablets (40 mEq total) by mouth 2 (two) times daily.    pravastatin 10 MG tablet Commonly known as: PRAVACHOL Take 1 tablet (10 mg total) by mouth daily.   QUEtiapine 100 MG tablet Commonly known as: SEROquel Take 1 tablet (100 mg total) by mouth at bedtime.   torsemide 20 MG tablet Commonly known as: DEMADEX Take 3 tabs twice daily What changed:   how much to take  how to take this  when to take this   Voltaren 1 % Gel Generic drug: diclofenac Sodium Apply 4 g topically 4 (four) times daily.        PATIENT EDUCATION & FOLLOW-UP PLAN  An appointment for Transitional Care Management is scheduled with Dettinger, Fransisca Kaufmann, MD / Hendricks Limes, NP on 05/20/2019 at 405 pm.  Take all medications as prescribed  Contact our office by calling [phone] if you have any questions or concerns

## 2019-05-18 NOTE — Telephone Encounter (Signed)
Patient just discharged from hospital and was advised to discontinue Colchicine. States he currently has gout and wants to know what to take instead.

## 2019-05-18 NOTE — Progress Notes (Signed)
Assessment & Plan:  1. DIASTOLIC HEART FAILURE, CHRONIC - Back at baseline since recent hospitalization. Continue diltiazem 300 mg daily, diltiazem 90 mg daily as needed, torsemide 60 mg 3 times daily, potassium 40 mEq twice daily, and metolazone 2.5 mg 3 times weekly. - CMP14+EGFR - metolazone (ZAROXOLYN) 2.5 MG tablet; Take 1 tablet (2.5 mg total) by mouth 3 (three) times a week.  Dispense: 30 tablet; Refill: 1  2. Gouty arthritis - Patient reports he was on allopurinol in the past and it did not do anything for him.  Advised to continue to hold colchicine and discuss prevention of gout flares with his PCP at his follow-up later this month. - Uric acid  3. Pancytopenia ([place]) - Continue to hold colchicine. - CBC with Differential/Platelet  4. Paroxysmal atrial fibrillation (HCC) - Well controlled on current regimen of diltiazem 300 mg once daily, 90 mg once daily as needed, and metoprolol 25 mg twice daily. - diltiazem (CARDIZEM SR) 90 MG 12 hr capsule; Take 1 capsule (90 mg total) by mouth daily as needed (palpitations).  Dispense: 30 capsule; Refill: 1 - CBC with Differential/Platelet - CMP14+EGFR  5. Acute gout of left elbow, unspecified cause - Education provided on gout. - CBC with Differential/Platelet - CMP14+EGFR - Uric acid - ketorolac (TORADOL) injection 60 mg - methylPREDNISolone acetate (DEPO-MEDROL) injection 80 mg   Follow up plan: Return as scheduled.  Hendricks Limes, MSN, APRN, FNP-C [facility]  Subjective:   Patient ID: [name], male    DOB: April 20, 1965, 54 y.o.   MRN: [id number]  HPI: [name] is a 54 y.o. male presenting on 05/20/2019 for Transitions Of Care (hospital follow up- 5/6 Ap- CHF)  Patient was hospitalized at [facility] from 05/14/2019 - 05/18/2019 due to volume overload; he was experiencing worsening scrotal edema, dyspnea, and PND. He was diuresed with IV furosemide which improved his symptoms. He was  discharged with instructions to continue torsemide 60 mg BID and to take metolazone Monday, Wednesday, and Friday. Home health services were arranged for him.   He was noted to have pancytopenia, so his colchicine was held.   He converted to A-Fib with RVR during hospitalization. He is not on anticoagulation due to thrombocytopenia and h/o upper GI bleed. His propranolol was changed to metoprolol for better rate control.   Today patient reports he has been taking medications as prescribed at discharge, although he does mention that his torsemide was 60 mg 3 times a day prior to hospitalization and that is what he has continued since discharge.  He has had no further symptoms which took him to the ER.   Patient is concerned today as he is having a gout flare of his left elbow.   ROS: Negative unless specifically indicated above in HPI.   Relevant past medical history reviewed and updated as indicated.   Allergies and medications reviewed and updated.   Current Outpatient Medications:  .  aspirin EC 81 MG tablet, Take 81 mg by mouth daily. , Disp: , Rfl:  .  baclofen (LIORESAL) 10 MG tablet, TAKE 1 TABLET BY MOUTH THREE TIMES DAILY (Patient taking differently: Take 10 mg by mouth as needed for muscle spasms. ), Disp: 30 tablet, Rfl: 3 .  betamethasone dipropionate 0.05 % cream, APPLY TO AFFECTED AREA TWICE A DAY (Patient taking differently: Apply 1 application topically daily as needed (itching). ), Disp: 45 g, Rfl: 3 .  buprenorphine (SUBUTEX) 2 MG SUBL SL tablet, Place 2  mg under the tongue 3 (three) times daily., Disp: , Rfl:  .  busPIRone (BUSPAR) 10 MG tablet, Take 1 tablet (10 mg total) by mouth 3 (three) times daily., Disp: 25 tablet, Rfl: 2 .  CONSTULOSE 10 GM/15ML solution, take TWO tablespoonsful (30 mls) BY MOUTH TWICE DAILY (Patient taking differently: Take 10 g by mouth 2 (two) times daily. ), Disp: 1892 mL, Rfl: 1 .  diclofenac Sodium (VOLTAREN) 1 % GEL, Apply 4 g topically 4  (four) times daily., Disp: , Rfl:  .  diltiazem (CARDIZEM SR) 90 MG 12 hr capsule, Take 1 capsule (90 mg total) by mouth daily as needed (palpitations)., Disp: 30 capsule, Rfl: 1 .  diltiazem (CARTIA XT) 300 MG 24 hr capsule, TAKE ONE CAPSULE BY MOUTH EVERY DAY (Patient taking differently: Take 300 mg by mouth daily. TAKE ONE CAPSULE BY MOUTH EVERY DAY), Disp: 90 capsule, Rfl: 3 .  DULoxetine (CYMBALTA) 60 MG capsule, Take 60 mg by mouth at bedtime. , Disp: , Rfl:  .  esomeprazole (NEXIUM) 40 MG capsule, Take 1 capsule (40 mg total) by mouth daily., Disp: 90 capsule, Rfl: 3 .  ezetimibe (ZETIA) 10 MG tablet, TAKE 1 TABLET BY MOUTH EVERY DAY (Patient taking differently: Take 10 mg by mouth daily. ), Disp: 30 tablet, Rfl: 5 .  fluticasone (FLONASE) 50 MCG/ACT nasal spray, USE TWO SPRAYS IN EACH NOSTRIL EVERY DAY AS NEEDED (Patient taking differently: Place 2 sprays into both nostrils daily as needed. ), Disp: 16 g, Rfl: 5 .  folic acid (FOLVITE) 1 MG tablet, TAKE ONE TABLET BY MOUTH ONCE DAILY - EMERGENCY REFILL FAXED DR. (Patient taking differently: Take 1 mg by mouth daily. ), Disp: 30 tablet, Rfl: 11 .  gabapentin (NEURONTIN) 800 MG tablet, Take 1 tablet (800 mg total) by mouth 3 (three) times daily., Disp: 90 tablet, Rfl: 3 .  HYDROmorphone (DILAUDID) 4 MG tablet, Take 4 mg by mouth every 4 (four) hours as needed for moderate pain or severe pain. , Disp: , Rfl:  .  ipratropium (ATROVENT) 0.02 % nebulizer solution, Take 0.5 mg by nebulization every 6 (six) hours as needed for wheezing or shortness of breath., Disp: , Rfl:  .  Menthol-Methyl Salicylate (MUSCLE RUB) 10-15 % CREA, Apply 1 application topically 3 (three) times daily as needed for muscle pain. For muscle pain , Disp: , Rfl:  .  metolazone (ZAROXOLYN) 2.5 MG tablet, Take 1 tablet (2.5 mg total) by mouth 3 (three) times a week., Disp: 30 tablet, Rfl: 1 .  metoprolol tartrate (LOPRESSOR) 25 MG tablet, Take 1 tablet (25 mg total) by mouth 2  (two) times daily., Disp: 60 tablet, Rfl: 0 .  Multiple Vitamins-Minerals (MULTI COMPLETE PO), Take 1 tablet by mouth daily. , Disp: , Rfl:  .  neomycin-bacitracin-polymyxin (NEOSPORIN) ointment, Apply 1 application topically every 12 (twelve) hours as needed for wound care. Reported on 06/01/2015, Disp: , Rfl:  .  potassium chloride SA (KLOR-CON) 20 MEQ tablet, Take 2 tablets (40 mEq total) by mouth 2 (two) times daily., Disp: 120 tablet, Rfl: 0 .  pravastatin (PRAVACHOL) 10 MG tablet, Take 1 tablet (10 mg total) by mouth daily., Disp: 90 tablet, Rfl: 3 .  QUEtiapine (SEROQUEL) 100 MG tablet, Take 1 tablet (100 mg total) by mouth at bedtime., Disp: 90 tablet, Rfl: 3 .  torsemide (DEMADEX) 20 MG tablet, Take 3 tabs twice daily (Patient taking differently: Take 20 mg by mouth in the morning, at noon, and at bedtime. Take 3  tabs twice daily), Disp: 180 tablet, Rfl: 0  Allergies  Allergen Reactions  . Allopurinol Other (See Comments)    Joint pain worse   . Oxycodone Base Itching  . Prozac [Fluoxetine Hcl] Itching    Objective:   BP 125/77   Pulse 92   Temp (!) 96.6 F (35.9 C) (Temporal)   Ht 6' 1"  (1.854 m)   Wt (!) 358 lb (162.4 kg)   SpO2 94%   BMI 47.23 kg/m    Physical Exam Vitals reviewed.  Constitutional:      General: He is not in acute distress.    Appearance: Normal appearance. He is morbidly obese. He is not ill-appearing, toxic-appearing or diaphoretic.  HENT:     Head: Normocephalic and atraumatic.  Eyes:     General: No scleral icterus.       Right eye: No discharge.        Left eye: No discharge.     Conjunctiva/sclera: Conjunctivae normal.  Cardiovascular:     Rate and Rhythm: Normal rate and regular rhythm.     Heart sounds: Normal heart sounds. No murmur. No friction rub. No gallop.   Pulmonary:     Effort: Pulmonary effort is normal. No respiratory distress.     Breath sounds: Normal breath sounds. No stridor. No wheezing, rhonchi or rales.    Musculoskeletal:        General: Normal range of motion.     Cervical back: Normal range of motion.  Skin:    General: Skin is warm and dry.     Findings: Erythema (and warmth to left elbow) present.  Neurological:     Mental Status: He is alert and oriented to person, place, and time. Mental status is at baseline.  Psychiatric:        Mood and Affect: Mood normal.        Behavior: Behavior normal.        Thought Content: Thought content normal.        Judgment: Judgment normal.

## 2019-05-18 NOTE — Telephone Encounter (Signed)
Ibuprofen or Aleve would be better options at this point, patient needs hospital follow-up as well

## 2019-05-18 NOTE — Progress Notes (Signed)
Patient states understanding of discharge instructions.  

## 2019-05-18 NOTE — Progress Notes (Signed)
No major events overnight, patient used his CPAP with with good toleration.  He is heart rate has improved, patient continue on atrial fibrillation rhythm.  Vital signs: Temperature 98.6, blood pressure 109/66, heart rate 77, respiratory rate 20. Telemetry monitor personally reviewed showing atrial fibrillation rhythm, heart rate ranging in the 80s.   His lungs are clear to auscultation bilaterally, heart S1-S2 present, irregular, soft abdomen, trace lower extremity edema.  Plan to discharge patient this morning if his heart rate remains well controlled.

## 2019-05-18 NOTE — Telephone Encounter (Signed)
Patient aware and verbalizes understanding.  Appointment scheduled.

## 2019-05-18 NOTE — Progress Notes (Signed)
OT Cancellation Note  Patient Details Name: [name] MRN: [id number] DOB: 1965/01/10   Cancelled Treatment:    Reason Eval/Treat Not Completed: Other (comment)  Planned discharge home this AM. Patient seen by PT and evaluated at [facility]. Based on PT evaluation, patient does not appear to require any follow up OT services although if home health PT sees any need they may let MD know. Thank you for the referral.   Ailene Ravel, OTR/L,CBIS  [phone]   Iza Preston, Clarene Duke 05/18/2019, 9:30 AM

## 2019-05-19 ENCOUNTER — Telehealth: Payer: Self-pay | Admitting: Family Medicine

## 2019-05-19 NOTE — Telephone Encounter (Signed)
Aware.  He should keep appointment with provider tomorrow.  Provider will review medications and decide what else can be done to help relieve gout pain.

## 2019-05-20 ENCOUNTER — Other Ambulatory Visit: Payer: Self-pay

## 2019-05-20 ENCOUNTER — Ambulatory Visit (INDEPENDENT_AMBULATORY_CARE_PROVIDER_SITE_OTHER): Payer: Medicare HMO | Admitting: Family Medicine

## 2019-05-20 ENCOUNTER — Encounter: Payer: Self-pay | Admitting: Family Medicine

## 2019-05-20 VITALS — BP 125/77 | HR 92 | Temp 96.6°F | Ht 73.0 in | Wt 358.0 lb

## 2019-05-20 DIAGNOSIS — Z79891 Long term (current) use of opiate analgesic: Secondary | ICD-10-CM | POA: Diagnosis not present

## 2019-05-20 DIAGNOSIS — D61818 Other pancytopenia: Secondary | ICD-10-CM

## 2019-05-20 DIAGNOSIS — G5712 Meralgia paresthetica, left lower limb: Secondary | ICD-10-CM | POA: Diagnosis not present

## 2019-05-20 DIAGNOSIS — G8929 Other chronic pain: Secondary | ICD-10-CM | POA: Diagnosis not present

## 2019-05-20 DIAGNOSIS — M109 Gout, unspecified: Secondary | ICD-10-CM

## 2019-05-20 DIAGNOSIS — M545 Low back pain: Secondary | ICD-10-CM | POA: Diagnosis not present

## 2019-05-20 DIAGNOSIS — I480 Paroxysmal atrial fibrillation: Secondary | ICD-10-CM

## 2019-05-20 DIAGNOSIS — M130 Polyarthritis, unspecified: Secondary | ICD-10-CM | POA: Diagnosis not present

## 2019-05-20 DIAGNOSIS — I5032 Chronic diastolic (congestive) heart failure: Secondary | ICD-10-CM

## 2019-05-20 DIAGNOSIS — G4733 Obstructive sleep apnea (adult) (pediatric): Secondary | ICD-10-CM | POA: Diagnosis not present

## 2019-05-20 DIAGNOSIS — G894 Chronic pain syndrome: Secondary | ICD-10-CM | POA: Diagnosis not present

## 2019-05-20 MED ORDER — METOLAZONE 2.5 MG PO TABS: 2.5000 mg | ORAL_TABLET | ORAL | 1 refills | Status: DC

## 2019-05-20 MED ORDER — KETOROLAC TROMETHAMINE 60 MG/2ML IM SOLN
60.0000 mg | Freq: Once | INTRAMUSCULAR | Status: AC
  Administered 2019-05-20: 60 mg via INTRAMUSCULAR

## 2019-05-20 MED ORDER — DILTIAZEM HCL ER 90 MG PO CP12: 90.0000 mg | ORAL_CAPSULE | Freq: Every day | ORAL | 1 refills | Status: DC | PRN

## 2019-05-20 MED ORDER — METHYLPREDNISOLONE ACETATE 80 MG/ML IJ SUSP
80.0000 mg | Freq: Once | INTRAMUSCULAR | Status: AC
  Administered 2019-05-20: 80 mg via INTRAMUSCULAR

## 2019-05-20 NOTE — Patient Instructions (Signed)
Gout  Gout is painful swelling of your joints. Gout is a type of arthritis. It is caused by having too much uric acid in your body. Uric acid is a chemical that is made when your body breaks down substances called purines. If your body has too much uric acid, sharp crystals can form and build up in your joints. This causes pain and swelling. Gout attacks can happen quickly and be very painful (acute gout). Over time, the attacks can affect more joints and happen more often (chronic gout). What are the causes?  Too much uric acid in your blood. This can happen because: ? Your kidneys do not remove enough uric acid from your blood. ? Your body makes too much uric acid. ? You eat too many foods that are high in purines. These foods include organ meats, some seafood, and beer.  Trauma or stress. What increases the risk?  Having a family history of gout.  Being male and middle-aged.  Being male and having gone through menopause.  Being very overweight (obese).  Drinking alcohol, especially beer.  Not having enough water in the body (being dehydrated).  Losing weight too quickly.  Having an organ transplant.  Having lead poisoning.  Taking certain medicines.  Having kidney disease.  Having a skin condition called psoriasis. What are the signs or symptoms? An attack of acute gout usually happens in just one joint. The most common place is the big toe. Attacks often start at night. Other joints that may be affected include joints of the feet, ankle, knee, fingers, wrist, or elbow. Symptoms of an attack may include:  Very bad pain.  Warmth.  Swelling.  Stiffness.  Shiny, red, or purple skin.  Tenderness. The affected joint may be very painful to touch.  Chills and fever. Chronic gout may cause symptoms more often. More joints may be involved. You may also have white or yellow lumps (tophi) on your hands or feet or in other areas near your joints. How is this  treated?  Treatment for this condition has two phases: treating an acute attack and preventing future attacks.  Acute gout treatment may include: ? NSAIDs. ? Steroids. These are taken by mouth or injected into a joint. ? Colchicine. This medicine relieves pain and swelling. It can be given by mouth or through an IV tube.  Preventive treatment may include: ? Taking small doses of NSAIDs or colchicine daily. ? Using a medicine that reduces uric acid levels in your blood. ? Making changes to your diet. You may need to see a food expert (dietitian) about what to eat and drink to prevent gout. Follow these instructions at home: During a gout attack   If told, put ice on the painful area: ? Put ice in a plastic bag. ? Place a towel between your skin and the bag. ? Leave the ice on for 20 minutes, 2-3 times a day.  Raise (elevate) the painful joint above the level of your heart as often as you can.  Rest the joint as much as possible. If the joint is in your leg, you may be given crutches.  Follow instructions from your doctor about what you cannot eat or drink. Avoiding future gout attacks  Eat a low-purine diet. Avoid foods and drinks such as: ? Liver. ? Kidney. ? Anchovies. ? Asparagus. ? Herring. ? Mushrooms. ? Mussels. ? Beer.  Stay at a healthy weight. If you want to lose weight, talk with your doctor. Do not lose  weight too fast.  Start or continue an exercise plan as told by your doctor. Eating and drinking  Drink enough fluids to keep your pee (urine) pale yellow.  If you drink alcohol: ? Limit how much you use to:  0-1 drink a day for women.  0-2 drinks a day for men. ? Be aware of how much alcohol is in your drink. In the [place] one drink equals one 12 oz bottle of beer (355 mL), one 5 oz glass of wine (148 mL), or one 1 oz glass of hard liquor (44 mL). General instructions  Take over-the-counter and prescription medicines only as told by your doctor.  Do  not drive or use heavy machinery while taking prescription pain medicine.  Return to your normal activities as told by your doctor. Ask your doctor what activities are safe for you.  Keep all follow-up visits as told by your doctor. This is important. Contact a doctor if:  You have another gout attack.  You still have symptoms of a gout attack after 10 days of treatment.  You have problems (side effects) because of your medicines.  You have chills or a fever.  You have burning pain when you pee (urinate).  You have pain in your lower back or belly. Get help right away if:  You have very bad pain.  Your pain cannot be controlled.  You cannot pee. Summary  Gout is painful swelling of the joints.  The most common site of pain is the big toe, but it can affect other joints.  Medicines and avoiding some foods can help to prevent and treat gout attacks. This information is not intended to replace advice given to you by your health care provider. Make sure you discuss any questions you have with your health care provider. Document Revised: 07/17/2017 Document Reviewed: 07/17/2017 Elsevier Patient Education  [place].

## 2019-05-21 LAB — CBC WITH DIFFERENTIAL/PLATELET
Basophils Absolute: 0 10*3/uL (ref 0.0–0.2)
Basos: 1 %
EOS (ABSOLUTE): 0.1 10*3/uL (ref 0.0–0.4)
Eos: 5 %
Hematocrit: 34 % — ABNORMAL LOW (ref 37.5–51.0)
Hemoglobin: 11.8 g/dL — ABNORMAL LOW (ref 13.0–17.7)
Immature Grans (Abs): 0 10*3/uL (ref 0.0–0.1)
Immature Granulocytes: 0 %
Lymphocytes Absolute: 0.5 10*3/uL — ABNORMAL LOW (ref 0.7–3.1)
Lymphs: 19 %
MCH: 28.3 pg (ref 26.6–33.0)
MCHC: 34.7 g/dL (ref 31.5–35.7)
MCV: 82 fL (ref 79–97)
Monocytes Absolute: 0.3 10*3/uL (ref 0.1–0.9)
Monocytes: 11 %
Neutrophils Absolute: 1.6 10*3/uL (ref 1.4–7.0)
Neutrophils: 64 %
Platelets: 47 10*3/uL — CL (ref 150–450)
RBC: 4.17 x10E6/uL (ref 4.14–5.80)
RDW: 15.9 % — ABNORMAL HIGH (ref 11.6–15.4)
WBC: 2.6 10*3/uL — ABNORMAL LOW (ref 3.4–10.8)

## 2019-05-21 LAB — CMP14+EGFR
ALT: 18 IU/L (ref 0–44)
AST: 43 IU/L — ABNORMAL HIGH (ref 0–40)
Albumin/Globulin Ratio: 1.2 (ref 1.2–2.2)
Albumin: 3.8 g/dL (ref 3.8–4.9)
Alkaline Phosphatase: 96 IU/L (ref 39–117)
BUN/Creatinine Ratio: 13 (ref 9–20)
BUN: 13 mg/dL (ref 6–24)
Bilirubin Total: 2.8 mg/dL — ABNORMAL HIGH (ref 0.0–1.2)
CO2: 22 mmol/L (ref 20–29)
Calcium: 8.4 mg/dL — ABNORMAL LOW (ref 8.7–10.2)
Chloride: 90 mmol/L — ABNORMAL LOW (ref 96–106)
Creatinine, Ser: 0.97 mg/dL (ref 0.76–1.27)
GFR calc Af Amer: 101 mL/min/{1.73_m2} (ref >59)
GFR calc non Af Amer: 88 mL/min/{1.73_m2} (ref >59)
Globulin, Total: 3.3 g/dL (ref 1.5–4.5)
Glucose: 130 mg/dL — ABNORMAL HIGH (ref 65–99)
Potassium: 3 mmol/L — ABNORMAL LOW (ref 3.5–5.2)
Sodium: 132 mmol/L — ABNORMAL LOW (ref 134–144)
Total Protein: 7.1 g/dL (ref 6.0–8.5)

## 2019-05-21 LAB — URIC ACID: Uric Acid: 8.6 mg/dL — ABNORMAL HIGH (ref 3.8–8.4)

## 2019-05-22 ENCOUNTER — Telehealth: Payer: Self-pay | Admitting: Family Medicine

## 2019-05-22 NOTE — Telephone Encounter (Signed)
Patient needs a script sent to [facility] for One Touch Verio Flex Lancets.  The pharmacy has sent this request per patient.  None on chart medication list.

## 2019-05-22 NOTE — Telephone Encounter (Signed)
Looking at the results I would say to go ahead and take 3 potassium pills instead of 2 for the next week and a half.  Then go back to just taking 2

## 2019-05-22 NOTE — Telephone Encounter (Signed)
Aware. 

## 2019-05-22 NOTE — Telephone Encounter (Signed)
Please review and advise.

## 2019-05-23 ENCOUNTER — Encounter: Payer: Self-pay | Admitting: Family Medicine

## 2019-05-25 ENCOUNTER — Other Ambulatory Visit: Payer: Self-pay | Admitting: Family Medicine

## 2019-05-25 ENCOUNTER — Telehealth: Payer: Self-pay | Admitting: Family Medicine

## 2019-05-25 DIAGNOSIS — M1A9XX0 Chronic gout, unspecified, without tophus (tophi): Secondary | ICD-10-CM

## 2019-05-25 NOTE — Telephone Encounter (Signed)
Noted  

## 2019-05-26 ENCOUNTER — Other Ambulatory Visit: Payer: Self-pay | Admitting: *Deleted

## 2019-05-26 NOTE — Telephone Encounter (Signed)
Prescribe enough for the patient to have 4 times daily testing for the One Touch lancets.  Please send enough for 1 years worth

## 2019-05-26 NOTE — Telephone Encounter (Signed)
[facility] from [facility] Refill on one touch lancets No longer on med list Is pt still to be testing BID

## 2019-05-27 MED ORDER — ONETOUCH DELICA LANCETS 30G MISC: 3 refills | Status: DC

## 2019-05-28 ENCOUNTER — Inpatient Hospital Stay: Payer: Medicare HMO | Admitting: Family Medicine

## 2019-05-28 DIAGNOSIS — J449 Chronic obstructive pulmonary disease, unspecified: Secondary | ICD-10-CM | POA: Diagnosis not present

## 2019-05-28 DIAGNOSIS — G4733 Obstructive sleep apnea (adult) (pediatric): Secondary | ICD-10-CM | POA: Diagnosis not present

## 2019-05-29 ENCOUNTER — Other Ambulatory Visit: Payer: Self-pay | Admitting: *Deleted

## 2019-05-29 DIAGNOSIS — R69 Illness, unspecified: Secondary | ICD-10-CM | POA: Diagnosis not present

## 2019-05-29 MED ORDER — BACLOFEN 10 MG PO TABS: 10.0000 mg | ORAL_TABLET | Freq: Three times a day (TID) | ORAL | 3 refills | Status: DC

## 2019-06-01 ENCOUNTER — Ambulatory Visit (INDEPENDENT_AMBULATORY_CARE_PROVIDER_SITE_OTHER): Payer: Medicare HMO | Admitting: Family Medicine

## 2019-06-01 ENCOUNTER — Other Ambulatory Visit: Payer: Self-pay | Admitting: Family Medicine

## 2019-06-01 DIAGNOSIS — K7030 Alcoholic cirrhosis of liver without ascites: Secondary | ICD-10-CM

## 2019-06-01 DIAGNOSIS — E119 Type 2 diabetes mellitus without complications: Secondary | ICD-10-CM | POA: Insufficient documentation

## 2019-06-01 NOTE — Progress Notes (Signed)
Attempted to call patient and left a message Caryl Pina, MD [facility] 06/01/2019, 2:14 PM  Attempted 1 more time, no answer Caryl Pina, MD [facility] 06/01/2019, 3:44 PM

## 2019-06-01 NOTE — Progress Notes (Deleted)
Cardiology Office Note   Date:  06/01/2019   ID:  EDILBERTO Gray, DOB Oct 31, 1965, MRN [id number]  PCP:  Dettinger, Fransisca Kaufmann, MD  Cardiologist:   No primary care provider on file. Referring:  ***  No chief complaint on file.     History of Present Illness: [name] is a 54 y.o. male who presents for follow up of diastolic heart failure and a history of atrial fibrillation.  Since I last saw him ***   ***he has done well.  He says that his knee has been hurting and that he has been unable to be very active.  The patient denies any new symptoms such as chest discomfort, neck or arm discomfort. There has been no new shortness of breath, PND or orthopnea. There have been no reported palpitations, presyncope or syncope.  He was losing weight but now has gained this back.    Past Medical History:  Diagnosis Date  . Anxiety   . Atrial fibrillation ([place])   . CHF (congestive heart failure) ([place])   . Cirrhosis ([place])    NASH  . Constipation   . COPD (chronic obstructive pulmonary disease) ([place])   . Depression, recurrent ([place]) 07/18/2016  . GERD (gastroesophageal reflux disease)   . Gout   . History of diabetes mellitus    type 2  . Hypertension associated with diabetes ([place])   . Leukopenia 07/08/2015  . Neuromuscular disorder (HCC)    neuropathy in hands and feet  . Psoriasis   . RA (rheumatoid arthritis) ([place])   . Sleep apnea    cpap used- level 10 and greater  . Thrombocytopenia ([place])     Past Surgical History:  Procedure Laterality Date  . ANKLE SURGERY     right ankle talor repair  . CHOLECYSTECTOMY  sept 2016  . CHOLECYSTECTOMY    . COLONOSCOPY  05/08/2011   Procedure: COLONOSCOPY;  Surgeon: Rogene Houston, MD;  [facility];  Service: Endoscopy;  Laterality: N/A;  730  . ESOPHAGOGASTRODUODENOSCOPY  02/28/2011   Procedure: ESOPHAGOGASTRODUODENOSCOPY (EGD);  Surgeon: Rogene Houston, MD;  Location[facility];  Service: Endoscopy;  Laterality:  N/A;  1200  . ESOPHAGOGASTRODUODENOSCOPY N/A 06/11/2012   Procedure: ESOPHAGOGASTRODUODENOSCOPY (EGD);  Surgeon: Rogene Houston, MD;  [facility];  Service: Endoscopy;  Laterality: N/A;  1200  FYI patient is 400 pounds  . ESOPHAGOGASTRODUODENOSCOPY (EGD) WITH PROPOFOL N/A 04/21/2014   Procedure: ESOPHAGOGASTRODUODENOSCOPY (EGD) WITH PROPOFOL;  Surgeon: Rogene Houston, MD;  Location: [facility];  Service: Endoscopy;  Laterality: N/A;  . HERNIA REPAIR Right    as child; inguinal  . HERNIA REPAIR  sept 2016  . LIVER BIOPSY  2012  . SCIATIC NERVE EXPLORATION    . TONSILLECTOMY       Current Outpatient Medications  Medication Sig Dispense Refill  . aspirin EC 81 MG tablet Take 81 mg by mouth daily.     . baclofen (LIORESAL) 10 MG tablet Take 1 tablet (10 mg total) by mouth 3 (three) times daily. 30 tablet 3  . betamethasone dipropionate 0.05 % cream APPLY TO AFFECTED AREA TWICE A DAY (Patient taking differently: Apply 1 application topically daily as needed (itching). ) 45 g 3  . buprenorphine (SUBUTEX) 2 MG SUBL SL tablet Place 2 mg under the tongue 3 (three) times daily.    . busPIRone (BUSPAR) 10 MG tablet Take 1 tablet (10 mg total) by mouth 3 (three) times daily. 25 tablet  2  . diclofenac Sodium (VOLTAREN) 1 % GEL Apply 4 g topically 4 (four) times daily.    Marland Kitchen diltiazem (CARDIZEM SR) 90 MG 12 hr capsule Take 1 capsule (90 mg total) by mouth daily as needed (palpitations). 30 capsule 1  . diltiazem (CARTIA XT) 300 MG 24 hr capsule TAKE ONE CAPSULE BY MOUTH EVERY DAY (Patient taking differently: Take 300 mg by mouth daily. TAKE ONE CAPSULE BY MOUTH EVERY DAY) 90 capsule 3  . DULoxetine (CYMBALTA) 60 MG capsule Take 60 mg by mouth at bedtime.     Marland Kitchen esomeprazole (NEXIUM) 40 MG capsule Take 1 capsule (40 mg total) by mouth daily. 90 capsule 3  . ezetimibe (ZETIA) 10 MG tablet TAKE 1 TABLET BY MOUTH EVERY DAY (Patient taking differently: Take 10 mg by mouth daily. ) 30 tablet 5  .  fluticasone (FLONASE) 50 MCG/ACT nasal spray USE TWO SPRAYS IN EACH NOSTRIL EVERY DAY AS NEEDED (Patient taking differently: Place 2 sprays into both nostrils daily as needed. ) 16 g 5  . folic acid (FOLVITE) 1 MG tablet TAKE ONE TABLET BY MOUTH ONCE DAILY - EMERGENCY REFILL FAXED DR. (Patient taking differently: Take 1 mg by mouth daily. ) 30 tablet 11  . gabapentin (NEURONTIN) 800 MG tablet Take 1 tablet (800 mg total) by mouth 3 (three) times daily. 90 tablet 3  . HYDROmorphone (DILAUDID) 4 MG tablet Take 4 mg by mouth every 4 (four) hours as needed for moderate pain or severe pain.     Marland Kitchen ipratropium (ATROVENT) 0.02 % nebulizer solution Take 0.5 mg by nebulization every 6 (six) hours as needed for wheezing or shortness of breath.    . lactulose (CHRONULAC) 10 GM/15ML solution TAKE 2 TABLESPOONS BY MOUTH TWICE DAILY 1892 mL 1  . Menthol-Methyl Salicylate (MUSCLE RUB) 10-15 % CREA Apply 1 application topically 3 (three) times daily as needed for muscle pain. For muscle pain     . metolazone (ZAROXOLYN) 2.5 MG tablet Take 1 tablet (2.5 mg total) by mouth 3 (three) times a week. 30 tablet 1  . metoprolol tartrate (LOPRESSOR) 25 MG tablet Take 1 tablet (25 mg total) by mouth 2 (two) times daily. 60 tablet 0  . Multiple Vitamins-Minerals (MULTI COMPLETE PO) Take 1 tablet by mouth daily.     Marland Kitchen neomycin-bacitracin-polymyxin (NEOSPORIN) ointment Apply 1 application topically every 12 (twelve) hours as needed for wound care. Reported on 06/01/2015    . OneTouch Delica Lancets 29U MISC Test BS QID Dx E11.9 400 each 3  . potassium chloride SA (KLOR-CON) 20 MEQ tablet Take 2 tablets (40 mEq total) by mouth 2 (two) times daily. 120 tablet 0  . pravastatin (PRAVACHOL) 10 MG tablet Take 1 tablet (10 mg total) by mouth daily. 90 tablet 3  . QUEtiapine (SEROQUEL) 100 MG tablet Take 1 tablet (100 mg total) by mouth at bedtime. 90 tablet 3  . torsemide (DEMADEX) 20 MG tablet Take 3 tabs twice daily (Patient taking  differently: Take 20 mg by mouth in the morning, at noon, and at bedtime. Take 3 tabs twice daily) 180 tablet 0   No current facility-administered medications for this visit.    Allergies:   Allopurinol, Oxycodone base, and Prozac [fluoxetine hcl]    ROS:  Please see the history of present illness.   Otherwise, review of systems are positive for {NONE DEFAULTED:18576::"none"}.   All other systems are reviewed and negative.    PHYSICAL EXAM: VS:  There were no vitals taken for this  visit. , BMI There is no height or weight on file to calculate BMI. GENERAL:  Well appearing NECK:  No jugular venous distention, waveform within normal limits, carotid upstroke brisk and symmetric, no bruits, no thyromegaly LUNGS:  Clear to auscultation bilaterally CHEST:  Unremarkable HEART:  PMI not displaced or sustained,S1 and S2 within normal limits, no S3, no S4, no clicks, no rubs, *** murmurs ABD:  Flat, positive bowel sounds normal in frequency in pitch, no bruits, no rebound, no guarding, no midline pulsatile mass, no hepatomegaly, no splenomegaly EXT:  2 plus pulses throughout, no edema, no cyanosis no clubbing    ***GENERAL:  Well appearing HEENT:  Pupils equal round and reactive, fundi not visualized, oral mucosa unremarkable NECK:  No jugular venous distention, waveform within normal limits, carotid upstroke brisk and symmetric, no bruits, no thyromegaly LYMPHATICS:  No cervical, inguinal adenopathy LUNGS:  Clear to auscultation bilaterally BACK:  No CVA tenderness CHEST:  Unremarkable HEART:  PMI not displaced or sustained,S1 and S2 within normal limits, no S3, no S4, no clicks, no rubs, *** murmurs ABD:  Flat, positive bowel sounds normal in frequency in pitch, no bruits, no rebound, no guarding, no midline pulsatile mass, no hepatomegaly, no splenomegaly EXT:  2 plus pulses throughout, no edema, no cyanosis no clubbing SKIN:  No rashes no nodules NEURO:  Cranial nerves II through XII  grossly intact, motor grossly intact throughout PSYCH:  Cognitively intact, oriented to person place and time    EKG:  EKG {ACTION; IS/IS YCX:[id number]} ordered today. The ekg ordered today demonstrates ***   Recent Labs: 05/14/2019: B Natriuretic Peptide 158.0; Magnesium 1.5 05/20/2019: ALT 18; BUN 13; Creatinine, Ser 0.97; Hemoglobin 11.8; Platelets 47; Potassium 3.0; Sodium 132    Lipid Panel    Component Value Date/Time   CHOL 154 02/17/2018 1055   CHOL 97 05/05/2012 1331   TRIG 149 02/17/2018 1055   TRIG 175 (H) 01/09/2013 1228   TRIG 107 05/05/2012 1331   HDL 40 02/17/2018 1055   HDL 38 (L) 01/09/2013 1228   HDL 30 (L) 05/05/2012 1331   CHOLHDL 3.9 02/17/2018 1055   LDLCALC 84 02/17/2018 1055   LDLCALC 78 01/09/2013 1228   LDLCALC 46 05/05/2012 1331      Wt Readings from Last 3 Encounters:  05/20/19 (!) 358 lb (162.4 kg)  05/15/19 (!) 364 lb 10.3 oz (165.4 kg)  04/25/19 (!) 360 lb 10.8 oz (163.6 kg)      Other studies Reviewed: Additional studies/ records that were reviewed today include: ***. Review of the above records demonstrates:  Please see elsewhere in the note.  ***   ASSESSMENT AND PLAN:  Chronic atrial fib:  ***  Chronic diastolic HF:  ***  DM:  ***  Covid education:  ***   Covid education:  ***   Current medicines are reviewed at length with the patient today.  The patient {ACTIONS; HAS/DOES NOT HAVE:19233} concerns regarding medicines.  The following changes have been made:  {PLAN; NO CHANGE:13088:s}  Labs/ tests ordered today include: *** No orders of the defined types were placed in this encounter.    Disposition:   FU with ***    Signed, Minus Breeding, MD  06/01/2019 9:26 PM    [facility]

## 2019-06-03 ENCOUNTER — Ambulatory Visit: Payer: Medicare HMO | Admitting: Cardiology

## 2019-06-04 ENCOUNTER — Telehealth: Payer: Self-pay | Admitting: Cardiology

## 2019-06-04 NOTE — Telephone Encounter (Signed)
OK to reschedule.

## 2019-06-04 NOTE — Telephone Encounter (Signed)
New Message  Patient is calling in to speak Dr. Percival Spanish or his nurse. States that he was having issues with his cpap mask and wasn't able to get any sleep or function which is the reason he has missed some of the appointments. Wants to apologize and see if it is ok to schedule another appointment with Dr. Percival Spanish or have him or his nurse call him. Please give patient a call back to discuss.

## 2019-06-04 NOTE — Telephone Encounter (Signed)
Will have Dr Percival Spanish review for a decision.

## 2019-06-09 NOTE — Telephone Encounter (Signed)
Rescheduled appt with pt in [place] for 7/7 at 2:40 pm.

## 2019-06-10 ENCOUNTER — Telehealth: Payer: Self-pay | Admitting: Family Medicine

## 2019-06-10 ENCOUNTER — Ambulatory Visit (INDEPENDENT_AMBULATORY_CARE_PROVIDER_SITE_OTHER): Payer: Medicare HMO | Admitting: Gastroenterology

## 2019-06-10 ENCOUNTER — Other Ambulatory Visit: Payer: Self-pay

## 2019-06-10 ENCOUNTER — Encounter (INDEPENDENT_AMBULATORY_CARE_PROVIDER_SITE_OTHER): Payer: Self-pay | Admitting: Gastroenterology

## 2019-06-10 VITALS — BP 113/69 | HR 69 | Temp 97.0°F | Ht 72.0 in | Wt 334.0 lb

## 2019-06-10 DIAGNOSIS — K7460 Unspecified cirrhosis of liver: Secondary | ICD-10-CM | POA: Diagnosis not present

## 2019-06-10 NOTE — Telephone Encounter (Signed)
Left message to please call our office if you still want to review lab results done in May.

## 2019-06-10 NOTE — Patient Instructions (Signed)
We are checking your labs today we will call w/ results

## 2019-06-10 NOTE — Progress Notes (Signed)
Patient profile: [name] is a 54 y.o. male seen for evaluation of cirrhosis-he has been followed previously in the past but was last seen in clinic February 2018 and has been lost to follow-up since.  Complex liver history including portal vein thrombosis with nonocclusive thrombus in May 2017, etiology secondary to alcohol and NASH.   History of Present Illness: [name] is seen today and reports he has been doing fair, he recently had 2 admissions last month for congestive heart failure and reports almost 60 pounds diuresed.  He is currently on torsemide 60 mg 3 times daily, potassium 40 mEq twice daily, and metolazone 2.5 mg 3 times weekly.  He feels this regimen has helped but he does still have some lower extremity edema.  He is not feel significant abdominal ascites.  He denies any abdominal pain.  He reports he is having a bowel movement every few days but when does it can be "pasty" or watery in consistency.  He denies any obvious blood in stools or abdominal pain.  He reports he takes lactulose and does not have a measuring cup but takes "3 swallows" 3 times a day.  She denies any obvious issues with hepatic encephalopathy.  Patient reports he tries to limit salt.  He lives with family who help him cook. He drinks 1 beer a day. Non smoker. No nsaids. Previously had heavy alcohol abuse w/ drinking 10+ beers a day.   Wt Readings from Last 3 Encounters:  06/10/19 (!) 334 lb (151.5 kg)  05/20/19 (!) 358 lb (162.4 kg)  05/15/19 (!) 364 lb 10.3 oz (165.4 kg)     Last Colonoscopy: 2013--Impression:  Examination performed to cecum. Two small polyps ablated via cold biopsy and submitted together(ascending and transverse colon). 6 mm and 10 mm polyp snared from proximal sigmoid colon and submitted together. One Hemoclip applied to each site to reduce the risk of post-polypectomy bleed given he has thrombocytopenia and portal hypertension. Mild sigmoid colon  diverticulosis. Internal hemorrhoids patent.   Last Endoscopy:  2016-- Two short columns of grade 1 esophageal varices. Portal gastropathy with antral nodules. Biopsy of these nodules in February 2013 revealed changes of portal hypertension. Speck blood noted just below the Z line secondary to portal gastropathy.  Per [facility] Notes: 2018 EGD findings were consistent with moderate portal hypertensive gastropathy found in the cardia, gastric fundus, gastric body. There is moderate and the risks of bleeding gastritis in the gastric antrum, and hemostatic clip was placed at that site. His H. pylori serum antigen is negative.    Past Medical History:  Past Medical History:  Diagnosis Date  . Anxiety   . Atrial fibrillation ([place])   . CHF (congestive heart failure) ([place])   . Cirrhosis ([place])    NASH  . Constipation   . COPD (chronic obstructive pulmonary disease) ([place])   . Depression, recurrent ([place]) 07/18/2016  . GERD (gastroesophageal reflux disease)   . Gout   . History of diabetes mellitus    type 2  . Hypertension associated with diabetes ([place])   . Leukopenia 07/08/2015  . Neuromuscular disorder (HCC)    neuropathy in hands and feet  . Psoriasis   . RA (rheumatoid arthritis) ([place])   . Sleep apnea    cpap used- level 10 and greater  . Thrombocytopenia ([place])     Problem List: Patient Active Problem List   Diagnosis Date Noted  . Type 2 diabetes mellitus without complication, without long-term current  use of insulin ([place]) 06/01/2019  . Acute on chronic diastolic CHF (congestive heart failure) ([place]) 05/14/2019  . Hypokalemia 05/14/2019  . Scrotal edema 04/22/2019  . Bilateral hydrocele 04/22/2019  . Acute on chronic diastolic heart failure ([place])   . Elevated AST (SGOT)   . Pancytopenia ([place])   . Bilateral primary osteoarthritis of knee 04/04/2017  . Depression, recurrent ([place]) 07/18/2016  . Mixed hyperlipidemia 10/31/2015  . Leukopenia 07/08/2015  .  Thrombocytopenia ([place]) 07/08/2015  . Portal vein thrombosis 06/08/2015  . Genu varus   . Hypogonadism male 05/05/2012  . Umbilical hernia, reducible 12/26/2011  . Asthma 08/28/2011  . Cirrhosis ([place]) 08/14/2011  . UNSPECIFIED TACHYCARDIA 02/01/2010  . History of diabetes mellitus 05/22/2008  . Gout 05/22/2008  . Hypertension associated with diabetes ([place]) 05/22/2008  . Atrial fibrillation ([place]) 05/22/2008  . DIASTOLIC HEART FAILURE, CHRONIC 05/22/2008  . OBESITY, MORBID 02/21/2007  . Coronary atherosclerosis 02/21/2007  . OSA (obstructive sleep apnea) 02/04/2007    Past Surgical History: Past Surgical History:  Procedure Laterality Date  . ANKLE SURGERY     right ankle talor repair  . CHOLECYSTECTOMY  sept 2016  . CHOLECYSTECTOMY    . COLONOSCOPY  05/08/2011   Procedure: COLONOSCOPY;  Surgeon: Rogene Houston, MD;  [facility];  Service: Endoscopy;  Laterality: N/A;  730  . ESOPHAGOGASTRODUODENOSCOPY  02/28/2011   Procedure: ESOPHAGOGASTRODUODENOSCOPY (EGD);  Surgeon: Rogene Houston, MD;  [facility];  Service: Endoscopy;  Laterality: N/A;  1200  . ESOPHAGOGASTRODUODENOSCOPY N/A 06/11/2012   Procedure: ESOPHAGOGASTRODUODENOSCOPY (EGD);  Surgeon: Rogene Houston, MD;  [facility];  Service: Endoscopy;  Laterality: N/A;  1200  FYI patient is 400 pounds  . ESOPHAGOGASTRODUODENOSCOPY (EGD) WITH PROPOFOL N/A 04/21/2014   Procedure: ESOPHAGOGASTRODUODENOSCOPY (EGD) WITH PROPOFOL;  Surgeon: Rogene Houston, MD;  Location: [facility];  Service: Endoscopy;  Laterality: N/A;  . HERNIA REPAIR Right    as child; inguinal  . HERNIA REPAIR  sept 2016  . LIVER BIOPSY  2012  . SCIATIC NERVE EXPLORATION    . TONSILLECTOMY      Allergies: Allergies  Allergen Reactions  . Allopurinol Other (See Comments)    Joint pain worse   . Oxycodone Base Itching  . Prozac [Fluoxetine Hcl] Itching      Home Medications:  Current Outpatient Medications:  .  aspirin EC  81 MG tablet, Take 81 mg by mouth daily. , Disp: , Rfl:  .  baclofen (LIORESAL) 10 MG tablet, Take 1 tablet (10 mg total) by mouth 3 (three) times daily., Disp: 30 tablet, Rfl: 3 .  betamethasone dipropionate 0.05 % cream, APPLY TO AFFECTED AREA TWICE A DAY (Patient taking differently: Apply 1 application topically daily as needed (itching). ), Disp: 45 g, Rfl: 3 .  buprenorphine (SUBUTEX) 2 MG SUBL SL tablet, Place 2 mg under the tongue 3 (three) times daily., Disp: , Rfl:  .  busPIRone (BUSPAR) 10 MG tablet, Take 1 tablet (10 mg total) by mouth 3 (three) times daily., Disp: 25 tablet, Rfl: 2 .  diclofenac Sodium (VOLTAREN) 1 % GEL, Apply 4 g topically 4 (four) times daily., Disp: , Rfl:  .  diltiazem (CARDIZEM SR) 90 MG 12 hr capsule, Take 1 capsule (90 mg total) by mouth daily as needed (palpitations)., Disp: 30 capsule, Rfl: 1 .  diltiazem (CARTIA XT) 300 MG 24 hr capsule, TAKE ONE CAPSULE BY MOUTH EVERY DAY (Patient taking differently: Take 300 mg by mouth daily. TAKE  ONE CAPSULE BY MOUTH EVERY DAY), Disp: 90 capsule, Rfl: 3 .  DULoxetine (CYMBALTA) 60 MG capsule, Take 60 mg by mouth at bedtime. , Disp: , Rfl:  .  esomeprazole (NEXIUM) 40 MG capsule, Take 1 capsule (40 mg total) by mouth daily., Disp: 90 capsule, Rfl: 3 .  ezetimibe (ZETIA) 10 MG tablet, TAKE 1 TABLET BY MOUTH EVERY DAY (Patient taking differently: Take 10 mg by mouth daily. ), Disp: 30 tablet, Rfl: 5 .  fluticasone (FLONASE) 50 MCG/ACT nasal spray, USE TWO SPRAYS IN EACH NOSTRIL EVERY DAY AS NEEDED (Patient taking differently: Place 2 sprays into both nostrils daily as needed. ), Disp: 16 g, Rfl: 5 .  folic acid (FOLVITE) 1 MG tablet, TAKE ONE TABLET BY MOUTH ONCE DAILY - EMERGENCY REFILL FAXED DR. (Patient taking differently: Take 1 mg by mouth daily. ), Disp: 30 tablet, Rfl: 11 .  gabapentin (NEURONTIN) 800 MG tablet, Take 1 tablet (800 mg total) by mouth 3 (three) times daily., Disp: 90 tablet, Rfl: 3 .  HYDROmorphone  (DILAUDID) 4 MG tablet, Take 4 mg by mouth every 4 (four) hours as needed for moderate pain or severe pain. , Disp: , Rfl:  .  ipratropium (ATROVENT) 0.02 % nebulizer solution, Take 0.5 mg by nebulization every 6 (six) hours as needed for wheezing or shortness of breath., Disp: , Rfl:  .  lactulose (CHRONULAC) 10 GM/15ML solution, TAKE 2 TABLESPOONS BY MOUTH TWICE DAILY, Disp: 1892 mL, Rfl: 1 .  Menthol-Methyl Salicylate (MUSCLE RUB) 10-15 % CREA, Apply 1 application topically 3 (three) times daily as needed for muscle pain. For muscle pain , Disp: , Rfl:  .  metolazone (ZAROXOLYN) 2.5 MG tablet, Take 1 tablet (2.5 mg total) by mouth 3 (three) times a week., Disp: 30 tablet, Rfl: 1 .  metoprolol tartrate (LOPRESSOR) 25 MG tablet, Take 1 tablet (25 mg total) by mouth 2 (two) times daily., Disp: 60 tablet, Rfl: 0 .  Multiple Vitamins-Minerals (MULTI COMPLETE PO), Take 1 tablet by mouth daily. , Disp: , Rfl:  .  neomycin-bacitracin-polymyxin (NEOSPORIN) ointment, Apply 1 application topically every 12 (twelve) hours as needed for wound care. Reported on 06/01/2015, Disp: , Rfl:  .  OneTouch Delica Lancets 31D MISC, Test BS QID Dx E11.9, Disp: 400 each, Rfl: 3 .  potassium chloride SA (KLOR-CON) 20 MEQ tablet, Take 2 tablets (40 mEq total) by mouth 2 (two) times daily., Disp: 120 tablet, Rfl: 0 .  pravastatin (PRAVACHOL) 10 MG tablet, Take 1 tablet (10 mg total) by mouth daily., Disp: 90 tablet, Rfl: 3 .  QUEtiapine (SEROQUEL) 100 MG tablet, Take 1 tablet (100 mg total) by mouth at bedtime., Disp: 90 tablet, Rfl: 3 .  torsemide (DEMADEX) 20 MG tablet, Take 3 tabs twice daily (Patient taking differently: Take 20 mg by mouth in the morning, at noon, and at bedtime. Take 3 tabs twice daily), Disp: 180 tablet, Rfl: 0   Family History: family history includes Cancer in his brother, father, mother, and sister; Colon cancer in his mother.    Social History:   reports that he has never smoked. His smokeless  tobacco use includes snuff and chew. He reports that he does not drink alcohol or use drugs.   Review of Systems: Constitutional: Denies weight loss/weight gain  Eyes: No changes in vision. ENT: No oral lesions, sore throat.  GI: see HPI.  Heme/Lymph: No easy bruising.  CV: No chest pain.  GU: No hematuria.  Integumentary: No rashes.  Neuro: No  headaches.  Psych: No depression/anxiety.  Endocrine: No heat/cold intolerance.  Allergic/Immunologic: No urticaria.  Resp: No cough, SOB.  Musculoskeletal: No joint swelling.    Physical Examination: BP 113/69 (BP Location: Right Arm, Patient Position: Sitting, Cuff Size: Large)   Pulse 69   Temp (!) 97 F (36.1 C) (Temporal)   Ht 6' (1.829 m)   Wt (!) 334 lb (151.5 kg)   BMI 45.30 kg/m  Gen: NAD, alert and oriented x 4, morbidly obese, ambulating w/ crutches HEENT: PEERLA, EOMI, Neck: supple, no JVD Chest: CTA bilaterally, no wheezes, crackles, or other adventitious sounds CV: RRR, no m/g/c/r Abd: soft, NT, ND, +BS in all four quadrants; no HSM, guarding, ridigity, or rebound tenderness. Soft large umbilical hernia. Ext: 2+ edema bilaterally.  Skin: multiple excoriations (reports severe itching) Lymph: no noted LAD  Data Reviewed:  Labs - 05/20/19-WBC 2.6, Hgb 11.8, Hct 34, platelet 47, CMP w/ glucose 130, sodium 132, potassium 3.0, normal creatinine, Tbili 2.8, AST 43.   CT February 2020-cirrhosis, splenomegaly, varices consistent with portal venous hypertension, small hiatal hernia, diverticulosis, fat-containing left inguinal hernia, enlarging complex periumbilical hernia, aortic atherosclerosis   Assessment/Plan: [name] is a 54 y.o. male with past medical history of cirrhosis has not had any routine hepatology care over the past 3 years. Has chronic thrombocytopenia. Will check INR today.  Due for AFP and ultrasound for [place] screening - ordered today.   Last EGD was 2018-we will discuss with Dr. Laural Golden regarding repeat  timing of EGD for varices screening.  We will check ammonia today as he is on chronic lactulose to make sure dose is adequate-encouraged him to measure this.  He has a history of portal vein thrombosis but has stopped warfarin in the past given GI bleed.    Hx of colon polyps - overdue for colonoscopy based on history of colon polyps, will discuss with Dr. Laural Golden given platelets below 50.  [name] was seen today for new patient (initial visit).  Diagnoses and all orders for this visit:  Hepatic cirrhosis, unspecified hepatic cirrhosis type, unspecified whether ascites present (HCC) -     INR/PT -     Ammonia -     CBC with Differential -     US Abdomen Complete; Future -     COMPLETE METABOLIC PANEL WITH GFR -     AFP tumor marker    Case to be discussed w/ Dr Laural Golden for further recommendations to plan of care.     I personally performed the service, non-incident to. (WP)  Laurine Blazer, [facility]

## 2019-06-13 ENCOUNTER — Other Ambulatory Visit: Payer: Self-pay | Admitting: Family Medicine

## 2019-06-13 DIAGNOSIS — Z9189 Other specified personal risk factors, not elsewhere classified: Secondary | ICD-10-CM

## 2019-06-13 DIAGNOSIS — E1165 Type 2 diabetes mellitus with hyperglycemia: Secondary | ICD-10-CM

## 2019-06-15 ENCOUNTER — Other Ambulatory Visit: Payer: Self-pay | Admitting: Family Medicine

## 2019-06-15 ENCOUNTER — Telehealth: Payer: Self-pay | Admitting: Family Medicine

## 2019-06-15 DIAGNOSIS — E1165 Type 2 diabetes mellitus with hyperglycemia: Secondary | ICD-10-CM

## 2019-06-15 DIAGNOSIS — Z9189 Other specified personal risk factors, not elsewhere classified: Secondary | ICD-10-CM

## 2019-06-15 NOTE — Telephone Encounter (Signed)
Patient aware that is ok for him to take tylenol and dilaudid.

## 2019-06-15 NOTE — Telephone Encounter (Signed)
If he is still on what we had him on last time from his pain management doctor of Dilaudid then yes he can take Tylenol with it.  If it is different than not that he needs to let us know so we can evaluate whether he can take it or not.

## 2019-06-17 LAB — HM DIABETES EYE EXAM

## 2019-06-18 ENCOUNTER — Ambulatory Visit (HOSPITAL_COMMUNITY)
Admission: RE | Admit: 2019-06-18 | Discharge: 2019-06-18 | Disposition: A | Discharge status: Home / Self Care | Payer: Medicare HMO | Source: Ambulatory Visit | Attending: Gastroenterology | Admitting: Gastroenterology

## 2019-06-18 ENCOUNTER — Other Ambulatory Visit: Payer: Self-pay

## 2019-06-18 DIAGNOSIS — K7460 Unspecified cirrhosis of liver: Secondary | ICD-10-CM | POA: Insufficient documentation

## 2019-06-25 ENCOUNTER — Other Ambulatory Visit: Payer: Self-pay | Admitting: Family Medicine

## 2019-06-25 DIAGNOSIS — F410 Panic disorder [episodic paroxysmal anxiety] without agoraphobia: Secondary | ICD-10-CM

## 2019-06-28 DIAGNOSIS — G4733 Obstructive sleep apnea (adult) (pediatric): Secondary | ICD-10-CM | POA: Diagnosis not present

## 2019-06-28 DIAGNOSIS — J449 Chronic obstructive pulmonary disease, unspecified: Secondary | ICD-10-CM | POA: Diagnosis not present

## 2019-07-01 ENCOUNTER — Other Ambulatory Visit: Payer: Self-pay | Admitting: Family Medicine

## 2019-07-01 ENCOUNTER — Telehealth: Payer: Self-pay | Admitting: Family Medicine

## 2019-07-01 ENCOUNTER — Inpatient Hospital Stay (HOSPITAL_COMMUNITY): Payer: Medicare HMO

## 2019-07-01 MED ORDER — TORSEMIDE 20 MG PO TABS: ORAL_TABLET | ORAL | 0 refills | Status: DC

## 2019-07-01 NOTE — Telephone Encounter (Signed)
Please let the patient know that I sent their prescription to their pharmacy. Thanks, WS

## 2019-07-01 NOTE — Telephone Encounter (Signed)
Pt states he has increased his torsemide (DEMADEX) 20 MG tablet  To 4 tablets a day to help keep his fluid under control so that he does not have to the hospital for CHF and is wanting to know if he can have another rx that states he can take 4 tablets per day prn. Pt states the ER dr suggested him to increase and it has been working. Pt states that he is short on medication due to the increase. [facility] [place]

## 2019-07-01 NOTE — Telephone Encounter (Signed)
Covering pcp please advise.

## 2019-07-02 ENCOUNTER — Other Ambulatory Visit: Payer: Self-pay

## 2019-07-02 ENCOUNTER — Inpatient Hospital Stay (HOSPITAL_COMMUNITY): Payer: Medicare HMO

## 2019-07-02 ENCOUNTER — Inpatient Hospital Stay (HOSPITAL_COMMUNITY): Payer: Medicare HMO | Attending: Hematology

## 2019-07-02 DIAGNOSIS — Z800 Family history of malignant neoplasm of digestive organs: Secondary | ICD-10-CM | POA: Insufficient documentation

## 2019-07-02 DIAGNOSIS — F329 Major depressive disorder, single episode, unspecified: Secondary | ICD-10-CM | POA: Diagnosis not present

## 2019-07-02 DIAGNOSIS — Z808 Family history of malignant neoplasm of other organs or systems: Secondary | ICD-10-CM | POA: Insufficient documentation

## 2019-07-02 DIAGNOSIS — E611 Iron deficiency: Secondary | ICD-10-CM | POA: Insufficient documentation

## 2019-07-02 DIAGNOSIS — G4730 Sleep apnea, unspecified: Secondary | ICD-10-CM | POA: Insufficient documentation

## 2019-07-02 DIAGNOSIS — K7030 Alcoholic cirrhosis of liver without ascites: Secondary | ICD-10-CM | POA: Diagnosis not present

## 2019-07-02 DIAGNOSIS — D61818 Other pancytopenia: Secondary | ICD-10-CM | POA: Insufficient documentation

## 2019-07-02 DIAGNOSIS — R69 Illness, unspecified: Secondary | ICD-10-CM | POA: Diagnosis not present

## 2019-07-02 DIAGNOSIS — Z79899 Other long term (current) drug therapy: Secondary | ICD-10-CM | POA: Diagnosis not present

## 2019-07-02 DIAGNOSIS — Z809 Family history of malignant neoplasm, unspecified: Secondary | ICD-10-CM | POA: Insufficient documentation

## 2019-07-02 DIAGNOSIS — K449 Diaphragmatic hernia without obstruction or gangrene: Secondary | ICD-10-CM | POA: Insufficient documentation

## 2019-07-02 DIAGNOSIS — Z885 Allergy status to narcotic agent status: Secondary | ICD-10-CM | POA: Insufficient documentation

## 2019-07-02 DIAGNOSIS — D509 Iron deficiency anemia, unspecified: Secondary | ICD-10-CM

## 2019-07-02 LAB — CBC WITH DIFFERENTIAL/PLATELET
Abs Immature Granulocytes: 0.01 10*3/uL (ref 0.00–0.07)
Basophils Absolute: 0 10*3/uL (ref 0.0–0.1)
Basophils Relative: 1 %
Eosinophils Absolute: 0.1 10*3/uL (ref 0.0–0.5)
Eosinophils Relative: 6 %
HCT: 33.2 % — ABNORMAL LOW (ref 39.0–52.0)
Hemoglobin: 10.5 g/dL — ABNORMAL LOW (ref 13.0–17.0)
Immature Granulocytes: 1 %
Lymphocytes Relative: 26 %
Lymphs Abs: 0.4 10*3/uL — ABNORMAL LOW (ref 0.7–4.0)
MCH: 26.7 pg (ref 26.0–34.0)
MCHC: 31.6 g/dL (ref 30.0–36.0)
MCV: 84.5 fL (ref 80.0–100.0)
Monocytes Absolute: 0.1 10*3/uL (ref 0.1–1.0)
Monocytes Relative: 10 %
Neutro Abs: 0.8 10*3/uL — ABNORMAL LOW (ref 1.7–7.7)
Neutrophils Relative %: 56 %
Platelets: 37 10*3/uL — ABNORMAL LOW (ref 150–400)
RBC: 3.93 MIL/uL — ABNORMAL LOW (ref 4.22–5.81)
RDW: 15.1 % (ref 11.5–15.5)
WBC: 1.5 10*3/uL — ABNORMAL LOW (ref 4.0–10.5)
nRBC: 0 % (ref 0.0–0.2)

## 2019-07-02 LAB — COMPREHENSIVE METABOLIC PANEL
ALT: 15 U/L (ref 0–44)
AST: 36 U/L (ref 15–41)
Albumin: 3.2 g/dL — ABNORMAL LOW (ref 3.5–5.0)
Alkaline Phosphatase: 93 U/L (ref 38–126)
Anion gap: 11 (ref 5–15)
BUN: 17 mg/dL (ref 6–20)
CO2: 29 mmol/L (ref 22–32)
Calcium: 8.7 mg/dL — ABNORMAL LOW (ref 8.9–10.3)
Chloride: 98 mmol/L (ref 98–111)
Creatinine, Ser: 0.96 mg/dL (ref 0.61–1.24)
GFR calc Af Amer: >60 mL/min (ref >60)
GFR calc non Af Amer: >60 mL/min (ref >60)
Glucose, Bld: 118 mg/dL — ABNORMAL HIGH (ref 70–99)
Potassium: 3.6 mmol/L (ref 3.5–5.1)
Sodium: 138 mmol/L (ref 135–145)
Total Bilirubin: 1.2 mg/dL (ref 0.3–1.2)
Total Protein: 6.8 g/dL (ref 6.5–8.1)

## 2019-07-02 LAB — IRON AND TIBC
Iron: 25 ug/dL — ABNORMAL LOW (ref 45–182)
Saturation Ratios: 7 % — ABNORMAL LOW (ref 17.9–39.5)
TIBC: 368 ug/dL (ref 250–450)
UIBC: 343 ug/dL

## 2019-07-02 LAB — LACTATE DEHYDROGENASE: LDH: 122 U/L (ref 98–192)

## 2019-07-02 LAB — FERRITIN: Ferritin: 30 ng/mL (ref 24–336)

## 2019-07-02 LAB — FOLATE: Folate: 34.2 ng/mL (ref >5.9)

## 2019-07-02 NOTE — Telephone Encounter (Signed)
Pt aware of provider feedback and voiced understanding.

## 2019-07-03 ENCOUNTER — Telehealth: Payer: Self-pay | Admitting: Family Medicine

## 2019-07-03 NOTE — Telephone Encounter (Signed)
He is aware that provider is out on vacation and can not address request until next week.

## 2019-07-03 NOTE — Telephone Encounter (Signed)
Pt called requesting Rx for Lidocaine for his knees. Pt says Dr Wendi Snipes last prescribed it to him so its been a while since he has taken it but says it used to help a lot. Pt says Dr Dettinger is aware of pts issue with knees.

## 2019-07-06 ENCOUNTER — Telehealth (HOSPITAL_COMMUNITY): Payer: Self-pay | Admitting: *Deleted

## 2019-07-06 ENCOUNTER — Telehealth: Payer: Self-pay | Admitting: *Deleted

## 2019-07-06 MED ORDER — LIDOCAINE 5 % EX OINT
1.0000 | TOPICAL_OINTMENT | CUTANEOUS | 0 refills | Status: DC | PRN
Start: 2019-07-06 — End: 2023-09-25

## 2019-07-06 NOTE — Telephone Encounter (Signed)
(  Key: [id number]) - [id number] Lidocaine 5% ointment     Status: PA Request  Created: June 28th, 2021 [id number]  Sent to plan: June 28th, 2021

## 2019-07-06 NOTE — Telephone Encounter (Signed)
I sent the lidocaine cream for the patient

## 2019-07-06 NOTE — Telephone Encounter (Addendum)
Approved today Your request has been approved Drug Lidocaine 5% ointment From 01/09/19-10/04/19 Pharm aware

## 2019-07-06 NOTE — Telephone Encounter (Signed)
Patient aware, script is ready. 

## 2019-07-06 NOTE — Telephone Encounter (Signed)
Pt called stating that he can not tolerate the Iron supplement that he was advised to take. He states that it makes him throw up and nauseated.   I spoke with Kirby Crigler PA and he advised that the pt could try taking Flintstone complete vitamins with Iron 2 tablets twice a day.   I advised the pt of above and he verbalized understanding.

## 2019-07-08 ENCOUNTER — Inpatient Hospital Stay (HOSPITAL_BASED_OUTPATIENT_CLINIC_OR_DEPARTMENT_OTHER): Payer: Medicare HMO | Admitting: Nurse Practitioner

## 2019-07-08 ENCOUNTER — Ambulatory Visit (HOSPITAL_COMMUNITY): Payer: Medicare HMO | Admitting: Hematology

## 2019-07-08 DIAGNOSIS — K449 Diaphragmatic hernia without obstruction or gangrene: Secondary | ICD-10-CM | POA: Diagnosis not present

## 2019-07-08 DIAGNOSIS — G5712 Meralgia paresthetica, left lower limb: Secondary | ICD-10-CM | POA: Diagnosis not present

## 2019-07-08 DIAGNOSIS — Z79899 Other long term (current) drug therapy: Secondary | ICD-10-CM | POA: Diagnosis not present

## 2019-07-08 DIAGNOSIS — Z800 Family history of malignant neoplasm of digestive organs: Secondary | ICD-10-CM | POA: Diagnosis not present

## 2019-07-08 DIAGNOSIS — Z885 Allergy status to narcotic agent status: Secondary | ICD-10-CM | POA: Diagnosis not present

## 2019-07-08 DIAGNOSIS — D61818 Other pancytopenia: Secondary | ICD-10-CM | POA: Diagnosis not present

## 2019-07-08 DIAGNOSIS — G4733 Obstructive sleep apnea (adult) (pediatric): Secondary | ICD-10-CM | POA: Diagnosis not present

## 2019-07-08 DIAGNOSIS — Z808 Family history of malignant neoplasm of other organs or systems: Secondary | ICD-10-CM | POA: Diagnosis not present

## 2019-07-08 DIAGNOSIS — D509 Iron deficiency anemia, unspecified: Secondary | ICD-10-CM | POA: Diagnosis not present

## 2019-07-08 DIAGNOSIS — G4730 Sleep apnea, unspecified: Secondary | ICD-10-CM | POA: Diagnosis not present

## 2019-07-08 DIAGNOSIS — Z79891 Long term (current) use of opiate analgesic: Secondary | ICD-10-CM | POA: Diagnosis not present

## 2019-07-08 DIAGNOSIS — M130 Polyarthritis, unspecified: Secondary | ICD-10-CM | POA: Diagnosis not present

## 2019-07-08 DIAGNOSIS — G894 Chronic pain syndrome: Secondary | ICD-10-CM | POA: Diagnosis not present

## 2019-07-08 DIAGNOSIS — R69 Illness, unspecified: Secondary | ICD-10-CM | POA: Diagnosis not present

## 2019-07-08 DIAGNOSIS — E611 Iron deficiency: Secondary | ICD-10-CM | POA: Diagnosis not present

## 2019-07-08 NOTE — Assessment & Plan Note (Signed)
1.  Pancytopenia: -Secondary to cirrhosis due to EtOH and possible NAFLD and splenomegaly. -Status post bone marrow aspiration and biopsy on 10/24/2015 that demonstrated normocellular bone marrow with trilineage hematopoiesis and normal cytogenetics. -Abdominal ultrasound performed on 04/2016 was consistent with cirrhosis of the liver.  Of note, there was a thrombus noted in the main portal vein that appeared to be nonocclusive.  Spleen was measured at 17.2 cm. -Labs done on 07/02/2019 showed WBC of 1.5, neutrophil count of 800, hemoglobin 10.5, platelets 37. -Patient denies any bleeding episodes.  Patient denies any abdominal pain. -We will continue to monitor him closely.  He was told to come back sooner if he starts developing any infections. -He will follow-up in 3 month with repeat labs.  2.  Iron deficiency state: -His last set of labs showed ferritin 42 and percent saturation 9.  Hemoglobin 10.6. -He was placed on oral iron tablets daily. -Lab recheck on 07/02/2019 showed ferritin 30 and percent saturation 7, hemoglobin 10.5. -We will give him 2 infusions of IV iron. -We will recheck him in 3 months.  3.  Transaminitis secondary to cirrhosis of the liver: -This is stable at this time -He is following up with GI.  4.  Hiatal hernia: -Patient states he is experiencing abdominal pain when he coughs or bears down. -He was referred to general surgery for recommendations. -He is high risk due to portal vein thrombosis.

## 2019-07-08 NOTE — Patient Instructions (Signed)
[facility] Discharge Instructions     Thank you for choosing [facility] at [facility] to provide your oncology and hematology care.  To afford each patient quality time with our provider, please arrive at least 15 minutes before your scheduled appointment time.   If you have a lab appointment with the Cancer Center please come in thru the Main Entrance and check in at the main information desk.  You need to re-schedule your appointment should you arrive 10 or more minutes late.  We strive to give you quality time with our providers, and arriving late affects you and other patients whose appointments are after yours.  Also, if you no show three or more times for appointments you may be dismissed from the clinic at the providers discretion.     Again, thank you for choosing [facility].  Our hope is that these requests will decrease the amount of time that you wait before being seen by our physicians.       _____________________________________________________________  Should you have questions after your visit to [facility], please contact our office at [phone] between the hours of 8:00 a.m. and 4:30 p.m.  Voicemails left after 4:00 p.m. will not be returned until the following business day.  For prescription refill requests, have your pharmacy contact our office and allow 72 hours.    Due to Covid, you will need to wear a mask upon entering the hospital. If you do not have a mask, a mask will be given to you at the Main Entrance upon arrival. For doctor visits, patients may have 1 support person with them. For treatment visits, patients can not have anyone with them due to social distancing guidelines and our immunocompromised population.      

## 2019-07-08 NOTE — Progress Notes (Signed)
[address], [place] [address]   CLINIC:  Medical Oncology/Hematology  PCP:  Dettinger, Fransisca Kaufmann, MD [place] [address] [phone]   REASON FOR VISIT: Follow-up for pancytopenia   CURRENT THERAPY: Observation   INTERVAL HISTORY:  Mr. Domingo 54 y.o. male returns for routine follow-up for pancytopenia.  Patient reports he has been doing well since last visit.  He denies any major bleeding episodes.  He denies any new repeated infections.  He denies any petechiae. Denies any nausea, vomiting, or diarrhea. Denies any new pains. Had not noticed any recent bleeding such as epistaxis, hematuria or hematochezia. Denies recent chest pain on exertion, shortness of breath on minimal exertion, pre-syncopal episodes, or palpitations. Denies any numbness or tingling in hands or feet. Denies any recent fevers, infections, or recent hospitalizations. Patient reports appetite at 100% and energy level at 50%.  He is eating well maintain his weight this time.     REVIEW OF SYSTEMS:  Review of Systems  All other systems reviewed and are negative.    PAST MEDICAL/SURGICAL HISTORY:  Past Medical History:  Diagnosis Date  . Anxiety   . Atrial fibrillation ([place])   . CHF (congestive heart failure) ([place])   . Cirrhosis ([place])    NASH  . Constipation   . COPD (chronic obstructive pulmonary disease) ([place])   . Depression, recurrent ([place]) 07/18/2016  . GERD (gastroesophageal reflux disease)   . Gout   . History of diabetes mellitus    type 2  . Hypertension associated with diabetes ([place])   . Leukopenia 07/08/2015  . Neuromuscular disorder ([facility])    neuropathy in hands and feet  . Psoriasis   . RA (rheumatoid arthritis) ([place])   . Sleep apnea    cpap used- level 10 and greater  . Thrombocytopenia ([place])    Past Surgical History:  Procedure Laterality Date  . ANKLE SURGERY     right ankle talor repair  . CHOLECYSTECTOMY  sept 2016  .  CHOLECYSTECTOMY    . COLONOSCOPY  05/08/2011   Procedure: COLONOSCOPY;  Surgeon: Rogene Houston, MD;  Location: [facility];  Service: Endoscopy;  Laterality: N/A;  730  . ESOPHAGOGASTRODUODENOSCOPY  02/28/2011   Procedure: ESOPHAGOGASTRODUODENOSCOPY (EGD);  Surgeon: Rogene Houston, MD;  Location: [facility];  Service: Endoscopy;  Laterality: N/A;  1200  . ESOPHAGOGASTRODUODENOSCOPY N/A 06/11/2012   Procedure: ESOPHAGOGASTRODUODENOSCOPY (EGD);  Surgeon: Rogene Houston, MD;  Location: [facility];  Service: Endoscopy;  Laterality: N/A;  1200  FYI patient is 400 pounds  . ESOPHAGOGASTRODUODENOSCOPY (EGD) WITH PROPOFOL N/A 04/21/2014   Procedure: ESOPHAGOGASTRODUODENOSCOPY (EGD) WITH PROPOFOL;  Surgeon: Rogene Houston, MD;  Location: [facility] ORS;  Service: Endoscopy;  Laterality: N/A;  . HERNIA REPAIR Right    as child; inguinal  . HERNIA REPAIR  sept 2016  . LIVER BIOPSY  2012  . SCIATIC NERVE EXPLORATION    . TONSILLECTOMY       SOCIAL HISTORY:  Social History   Socioeconomic History  . Marital status: Divorced    Spouse name: Not on file  . Number of children: Not on file  . Years of education: Not on file  . Highest education level: Not on file  Occupational History  . Not on file  Tobacco Use  . Smoking status: Never Smoker  . Smokeless tobacco: Current User    Types: Snuff, Chew  . Tobacco comment: Pt reports that he "dips"  Vaping Use  . Vaping Use: Never used  Substance and Sexual Activity  . Alcohol use: No    Alcohol/week: 1.0 standard drink    Types: 1 Cans of beer per week    Comment: stopped drinking 2-3 yrs ago.   . Drug use: No    Comment: Use to smoke cocaine and marijuana. No IV drug use  . Sexual activity: Yes  Other Topics Concern  . Not on file  Social History Narrative  . Not on file   Social Determinants of Health   Financial Resource Strain:   . Difficulty of Paying Living Expenses:   Food Insecurity:   . Worried About Charity fundraiser in  the Last Year:   . Arboriculturist in the Last Year:   Transportation Needs:   . Film/video editor (Medical):   Marland Kitchen Lack of Transportation (Non-Medical):   Physical Activity:   . Days of Exercise per Week:   . Minutes of Exercise per Session:   Stress:   . Feeling of Stress :   Social Connections:   . Frequency of Communication with Friends and Family:   . Frequency of Social Gatherings with Friends and Family:   . Attends Religious Services:   . Active Member of Clubs or Organizations:   . Attends Archivist Meetings:   Marland Kitchen Marital Status:   Intimate Partner Violence:   . Fear of Current or Ex-Partner:   . Emotionally Abused:   Marland Kitchen Physically Abused:   . Sexually Abused:     FAMILY HISTORY:  Family History  Problem Relation Age of Onset  . Colon cancer Mother   . Cancer Mother        intestine  . Cancer Father        throat  . Cancer Brother        brain  . Cancer Sister   . Heart attack Neg Hx   . Stroke Neg Hx     CURRENT MEDICATIONS:  Outpatient Encounter Medications as of 07/08/2019  Medication Sig Note  . aspirin EC 81 MG tablet Take 81 mg by mouth as needed (CHEST PAIN).  06/10/2019: PRN  . baclofen (LIORESAL) 10 MG tablet Take 1 tablet (10 mg total) by mouth 3 (three) times daily.   . betamethasone dipropionate 0.05 % cream APPLY TO AFFECTED AREA TWICE A DAY (Patient taking differently: Apply 1 application topically daily as needed (itching). )   . buprenorphine (SUBUTEX) 2 MG SUBL SL tablet Place 2 mg under the tongue 3 (three) times daily.   . busPIRone (BUSPAR) 10 MG tablet TAKE 1 TABLET BY MOUTH THREE TIMES A DAY   . diclofenac Sodium (VOLTAREN) 1 % GEL Apply 4 g topically 4 (four) times daily.   Marland Kitchen diltiazem (CARDIZEM SR) 90 MG 12 hr capsule Take 1 capsule (90 mg total) by mouth daily as needed (palpitations).   Marland Kitchen diltiazem (CARTIA XT) 300 MG 24 hr capsule TAKE ONE CAPSULE BY MOUTH EVERY DAY (Patient taking differently: Take 300 mg by mouth daily.  TAKE ONE CAPSULE BY MOUTH EVERY DAY)   . DULoxetine (CYMBALTA) 60 MG capsule Take 60 mg by mouth at bedtime.    Marland Kitchen esomeprazole (NEXIUM) 40 MG capsule Take 1 capsule (40 mg total) by mouth daily.   Marland Kitchen ezetimibe (ZETIA) 10 MG tablet TAKE 1 TABLET BY MOUTH EVERY DAY (Patient taking differently: Take 10 mg by mouth daily. )   . folic acid (FOLVITE) 1 MG tablet TAKE  ONE TABLET BY MOUTH ONCE DAILY - EMERGENCY REFILL FAXED DR. (Patient taking differently: Take 1 mg by mouth daily. )   . gabapentin (NEURONTIN) 800 MG tablet Take 1 tablet (800 mg total) by mouth 3 (three) times daily.   Marland Kitchen lactulose (CHRONULAC) 10 GM/15ML solution TAKE 2 TABLESPOONS BY MOUTH TWICE DAILY 06/10/2019: Patient reports that he has been taking three times daily.  Marland Kitchen lidocaine (XYLOCAINE) 5 % ointment Apply 1 application topically as needed.   . Menthol-Methyl Salicylate (MUSCLE RUB) 10-15 % CREA Apply 1 application topically 3 (three) times daily as needed for muscle pain. For muscle pain    . metolazone (ZAROXOLYN) 2.5 MG tablet Take 1 tablet (2.5 mg total) by mouth 3 (three) times a week.   . metoprolol tartrate (LOPRESSOR) 25 MG tablet Take 1 tablet (25 mg total) by mouth 2 (two) times daily.   . Multiple Vitamins-Minerals (MULTI COMPLETE PO) Take 1 tablet by mouth daily.    Glory Rosebush Delica Lancets 26V MISC Test BS QID Dx E11.9   . pravastatin (PRAVACHOL) 10 MG tablet Take 1 tablet (10 mg total) by mouth daily. (Needs to be seen before next refill)   . QUEtiapine (SEROQUEL) 100 MG tablet Take 1 tablet (100 mg total) by mouth at bedtime.   . torsemide (DEMADEX) 20 MG tablet Take 4 tabs twice daily   . fluticasone (FLONASE) 50 MCG/ACT nasal spray USE TWO SPRAYS IN EACH NOSTRIL EVERY DAY AS NEEDED (Patient not taking: Reported on 07/08/2019)   . HYDROmorphone (DILAUDID) 4 MG tablet Take 4 mg by mouth every 4 (four) hours as needed for moderate pain or severe pain.  (Patient not taking: Reported on 07/08/2019)   . ipratropium  (ATROVENT) 0.02 % nebulizer solution Take 0.5 mg by nebulization every 6 (six) hours as needed for wheezing or shortness of breath. (Patient not taking: Reported on 07/08/2019)   . neomycin-bacitracin-polymyxin (NEOSPORIN) ointment Apply 1 application topically every 12 (twelve) hours as needed for wound care. Reported on 06/01/2015 (Patient not taking: Reported on 07/08/2019)   . potassium chloride SA (KLOR-CON) 20 MEQ tablet Take 2 tablets (40 mEq total) by mouth 2 (two) times daily. 06/10/2019: Patient reports that he is taking 3 a day.   No facility-administered encounter medications on file as of 07/08/2019.    ALLERGIES:  Allergies  Allergen Reactions  . Allopurinol Other (See Comments)    Joint pain worse   . Oxycodone Base Itching  . Prozac [Fluoxetine Hcl] Itching     PHYSICAL EXAM:  ECOG Performance status: 1  Vitals:   07/08/19 1339  BP: (!) 144/81  Pulse: 96  Resp: 18  Temp: (!) 97.5 F (36.4 C)  SpO2: 96%   Filed Weights   07/08/19 1339  Weight: (!) 338 lb 9.6 oz (153.6 kg)    Physical Exam Constitutional:      Appearance: Normal appearance. He is normal weight.  Cardiovascular:     Rate and Rhythm: Normal rate and regular rhythm.     Heart sounds: Normal heart sounds.  Pulmonary:     Effort: Pulmonary effort is normal.     Breath sounds: Normal breath sounds.  Abdominal:     General: Bowel sounds are normal.     Palpations: Abdomen is soft.  Musculoskeletal:        General: Normal range of motion.  Skin:    General: Skin is warm.  Neurological:     Mental Status: He is alert and oriented to person, place, and  time. Mental status is at baseline.  Psychiatric:        Mood and Affect: Mood normal.        Behavior: Behavior normal.        Thought Content: Thought content normal.        Judgment: Judgment normal.      LABORATORY DATA:  I have reviewed the labs as listed.  CBC    Component Value Date/Time   WBC 1.5 (L) 07/02/2019 0955   RBC 3.93  (L) 07/02/2019 0955   HGB 10.5 (L) 07/02/2019 0955   HGB 11.8 (L) 05/20/2019 1645   HCT 33.2 (L) 07/02/2019 0955   HCT 34.0 (L) 05/20/2019 1645   PLT 37 (L) 07/02/2019 0955   PLT 47 (LL) 05/20/2019 1645   MCV 84.5 07/02/2019 0955   MCV 82 05/20/2019 1645   MCH 26.7 07/02/2019 0955   MCHC 31.6 07/02/2019 0955   RDW 15.1 07/02/2019 0955   RDW 15.9 (H) 05/20/2019 1645   LYMPHSABS 0.4 (L) 07/02/2019 0955   LYMPHSABS 0.5 (L) 05/20/2019 1645   MONOABS 0.1 07/02/2019 0955   EOSABS 0.1 07/02/2019 0955   EOSABS 0.1 05/20/2019 1645   BASOSABS 0.0 07/02/2019 0955   BASOSABS 0.0 05/20/2019 1645   CMP Latest Ref Rng & Units 07/02/2019 05/20/2019 05/18/2019  Glucose 70 - 99 mg/dL 118(H) 130(H) 109(H)  BUN 6 - 20 mg/dL 17 13 12   Creatinine 0.61 - 1.24 mg/dL 0.96 0.97 0.90  Sodium 135 - 145 mmol/L 138 132(L) 135  Potassium 3.5 - 5.1 mmol/L 3.6 3.0(L) 3.8  Chloride 98 - 111 mmol/L 98 90(L) 96(L)  CO2 22 - 32 mmol/L 29 22 28   Calcium 8.9 - 10.3 mg/dL 8.7(L) 8.4(L) 8.6(L)  Total Protein 6.5 - 8.1 g/dL 6.8 7.1 -  Total Bilirubin 0.3 - 1.2 mg/dL 1.2 2.8(H) -  Alkaline Phos 38 - 126 U/L 93 96 -  AST 15 - 41 U/L 36 43(H) -  ALT 0 - 44 U/L 15 18 -    All questions were answered to patient's stated satisfaction. Encouraged patient to call with any new concerns or questions before his next visit to the cancer center and we can certain see him sooner, if needed.     ASSESSMENT & PLAN:  Pancytopenia ([place]) 1.  Pancytopenia: -Secondary to cirrhosis due to EtOH and possible NAFLD and splenomegaly. -Status post bone marrow aspiration and biopsy on 10/24/2015 that demonstrated normocellular bone marrow with trilineage hematopoiesis and normal cytogenetics. -Abdominal ultrasound performed on 04/2016 was consistent with cirrhosis of the liver.  Of note, there was a thrombus noted in the main portal vein that appeared to be nonocclusive.  Spleen was measured at 17.2 cm. -Labs done on 07/02/2019 showed WBC of  1.5, neutrophil count of 800, hemoglobin 10.5, platelets 37. -Patient denies any bleeding episodes.  Patient denies any abdominal pain. -We will continue to monitor him closely.  He was told to come back sooner if he starts developing any infections. -He will follow-up in 3 month with repeat labs.  2.  Iron deficiency state: -His last set of labs showed ferritin 42 and percent saturation 9.  Hemoglobin 10.6. -He was placed on oral iron tablets daily. -Lab recheck on 07/02/2019 showed ferritin 30 and percent saturation 7, hemoglobin 10.5. -We will give him 2 infusions of IV iron. -We will recheck him in 3 months.  3.  Transaminitis secondary to cirrhosis of the liver: -This is stable at this time -He is following up with  GI.  4.  Hiatal hernia: -Patient states he is experiencing abdominal pain when he coughs or bears down. -He was referred to general surgery for recommendations. -He is high risk due to portal vein thrombosis.     Orders placed this encounter:  Orders Placed This Encounter  Procedures  . Lactate dehydrogenase  . CBC with Differential/Platelet  . Comprehensive metabolic panel  . Ferritin  . Iron and TIBC  . Vitamin B12  . VITAMIN D 25 Hydroxy (Vit-D Deficiency, Fractures)      Francene Finders, FNP-C [place] [phone]

## 2019-07-09 ENCOUNTER — Telehealth: Payer: Self-pay | Admitting: Family Medicine

## 2019-07-09 MED ORDER — FLEET ENEMA 7-19 GM/118ML RE ENEM
1.0000 | ENEMA | Freq: Once | RECTAL | 1 refills | Status: AC
Start: 2019-07-09 — End: 2019-07-09

## 2019-07-09 NOTE — Telephone Encounter (Signed)
The I sent in an enema for the patient

## 2019-07-09 NOTE — Telephone Encounter (Signed)
Patient aware he can use enema. He is requesting [facility] [place]

## 2019-07-09 NOTE — Telephone Encounter (Signed)
Pt states that he has been constipated for 5 days and is wanting to know if something can be called in. He has tried lactulose. Pt is wanting to know if he can do a enema. Requesting to speak to the nurse.

## 2019-07-09 NOTE — Telephone Encounter (Signed)
Patient aware.

## 2019-07-14 ENCOUNTER — Telehealth: Payer: Self-pay | Admitting: Family Medicine

## 2019-07-14 DIAGNOSIS — E785 Hyperlipidemia, unspecified: Secondary | ICD-10-CM | POA: Insufficient documentation

## 2019-07-14 NOTE — Progress Notes (Signed)
Cardiology Office Note   Date:  07/15/2019   ID:  [name], DOB [birth year], MRN [id number]  PCP:  Dettinger, Fransisca Kaufmann, MD  Cardiologist:   No primary care provider on file.   Chief Complaint  Patient presents with  . Shortness of Breath      History of Present Illness: [name] is a 54 y.o. male who presents for follow up of diastolic heart failure and a history of atrial fibrillation. He has missed many follow up appts.  He was in the hospital recently.   He had acute diastolic HF.  I reviewed these records for this visit.    He was diuresed 10 liters.  He had atrial fib with RVR but was not thought to be an anticoagulation risk because of previous upper GI bleed.  He was also hospitalized in April.     He came today for his appointment coming in on crutches.  He has problems with gout and joints.  He said he was doing some Tylenol actually this morning however.  He apparently feels pretty good and is not describing any new shortness of breath, PND or orthopnea.  He says he occasionally feels his heart going out of rhythm but has not had any problems with this.  He will take an extra 90 mg of immediate release Cardizem if his heart is racing.  He says this does not happen very often.  He is taking his torsemide 1 pill 4 times a day which is not how it was prescribed but it seems to be keeping his weight down.  He says he is weighing himself every morning.  He takes Zaroxolyn for 3 days in a row every other week.  Again he is not having any overt failure symptoms and did have recent lab work as below.  I do not think that he is particularly compliant with salt restriction mostly from an education standpoint we did talk about this.  Past Medical History:  Diagnosis Date  . Anxiety   . Atrial fibrillation ([place])   . CHF (congestive heart failure) ([place])   . Cirrhosis ([place])    NASH  . Constipation   . COPD (chronic obstructive pulmonary disease) ([place])   . Depression,  recurrent ([place]) 07/18/2016  . GERD (gastroesophageal reflux disease)   . Gout   . Leukopenia 07/08/2015  . Neuromuscular disorder ([facility])    neuropathy in hands and feet  . Psoriasis   . RA (rheumatoid arthritis) ([place])   . Sleep apnea    cpap used- level 10 and greater  . Thrombocytopenia ([place])     Past Surgical History:  Procedure Laterality Date  . ANKLE SURGERY     right ankle talor repair  . CHOLECYSTECTOMY  sept 2016  . CHOLECYSTECTOMY    . COLONOSCOPY  05/08/2011   Procedure: COLONOSCOPY;  Surgeon: Rogene Houston, MD;  Location: [facility];  Service: Endoscopy;  Laterality: N/A;  730  . ESOPHAGOGASTRODUODENOSCOPY  02/28/2011   Procedure: ESOPHAGOGASTRODUODENOSCOPY (EGD);  Surgeon: Rogene Houston, MD;  Location: [facility];  Service: Endoscopy;  Laterality: N/A;  1200  . ESOPHAGOGASTRODUODENOSCOPY N/A 06/11/2012   Procedure: ESOPHAGOGASTRODUODENOSCOPY (EGD);  Surgeon: Rogene Houston, MD;  Location: [facility];  Service: Endoscopy;  Laterality: N/A;  1200  FYI patient is 400 pounds  . ESOPHAGOGASTRODUODENOSCOPY (EGD) WITH PROPOFOL N/A 04/21/2014   Procedure: ESOPHAGOGASTRODUODENOSCOPY (EGD) WITH PROPOFOL;  Surgeon: Rogene Houston, MD;  Location: [facility]S;  Service:  Endoscopy;  Laterality: N/A;  . HERNIA REPAIR Right    as child; inguinal  . HERNIA REPAIR  sept 2016  . LIVER BIOPSY  2012  . SCIATIC NERVE EXPLORATION    . TONSILLECTOMY       Current Outpatient Medications  Medication Sig Dispense Refill  . aspirin EC 81 MG tablet Take 81 mg by mouth as needed (CHEST PAIN).     Marland Kitchen baclofen (LIORESAL) 10 MG tablet Take 1 tablet (10 mg total) by mouth 3 (three) times daily. 30 tablet 3  . betamethasone dipropionate 0.05 % cream APPLY TO AFFECTED AREA TWICE A DAY (Patient taking differently: Apply 1 application topically daily as needed (itching). ) 45 g 3  . buprenorphine (SUBUTEX) 2 MG SUBL SL tablet 2 mg. Place 2 tablets under tongue twice daily    . busPIRone (BUSPAR) 10  MG tablet TAKE 1 TABLET BY MOUTH THREE TIMES A DAY 25 tablet 1  . colchicine 0.6 MG tablet Take 0.6 mg by mouth 2 (two) times daily as needed.    . diclofenac Sodium (VOLTAREN) 1 % GEL Apply 4 g topically 4 (four) times daily.    Marland Kitchen diltiazem (CARDIZEM SR) 90 MG 12 hr capsule Take 1 capsule (90 mg total) by mouth daily as needed (palpitations). 30 capsule 6  . diltiazem (CARTIA XT) 300 MG 24 hr capsule TAKE ONE CAPSULE BY MOUTH EVERY DAY (Patient taking differently: Take 300 mg by mouth daily. TAKE ONE CAPSULE BY MOUTH EVERY DAY) 90 capsule 3  . DULoxetine (CYMBALTA) 60 MG capsule Take 60 mg by mouth at bedtime.     Marland Kitchen esomeprazole (NEXIUM) 40 MG capsule Take 1 capsule (40 mg total) by mouth daily. 90 capsule 3  . ezetimibe (ZETIA) 10 MG tablet TAKE 1 TABLET BY MOUTH EVERY DAY (Patient taking differently: Take 10 mg by mouth daily. ) 30 tablet 5  . fluticasone (FLONASE) 50 MCG/ACT nasal spray USE TWO SPRAYS IN EACH NOSTRIL EVERY DAY AS NEEDED 16 g 5  . folic acid (FOLVITE) 1 MG tablet TAKE ONE TABLET BY MOUTH ONCE DAILY - EMERGENCY REFILL FAXED DR. (Patient taking differently: Take 1 mg by mouth daily. ) 30 tablet 11  . gabapentin (NEURONTIN) 800 MG tablet Take 1 tablet (800 mg total) by mouth 3 (three) times daily. 90 tablet 3  . HYDROmorphone (DILAUDID) 4 MG tablet Take 4 mg by mouth every 4 (four) hours as needed for moderate pain or severe pain.     Marland Kitchen ipratropium (ATROVENT) 0.02 % nebulizer solution Take 0.5 mg by nebulization every 6 (six) hours as needed for wheezing or shortness of breath.     . lactulose (CHRONULAC) 10 GM/15ML solution TAKE 2 TABLESPOONS BY MOUTH TWICE DAILY 1892 mL 1  . lidocaine (XYLOCAINE) 5 % ointment Apply 1 application topically as needed. 35.44 g 0  . Menthol-Methyl Salicylate (MUSCLE RUB) 10-15 % CREA Apply 1 application topically 3 (three) times daily as needed for muscle pain. For muscle pain     . metolazone (ZAROXOLYN) 2.5 MG tablet Take 1 tablet (2.5 mg total) by  mouth 3 (three) times a week. 30 tablet 1  . metoprolol tartrate (LOPRESSOR) 25 MG tablet Take 1 tablet (25 mg total) by mouth 2 (two) times daily. 60 tablet 0  . Multiple Vitamins-Minerals (MULTI COMPLETE PO) Take 1 tablet by mouth daily.     Marland Kitchen neomycin-bacitracin-polymyxin (NEOSPORIN) ointment Apply 1 application topically every 12 (twelve) hours as needed for wound care. Reported on 06/01/2015    .  potassium chloride SA (KLOR-CON) 20 MEQ tablet Take 2 tablets (40 mEq total) by mouth 2 (two) times daily. 120 tablet 0  . pravastatin (PRAVACHOL) 10 MG tablet Take 1 tablet (10 mg total) by mouth daily. (Needs to be seen before next refill) 30 tablet 0  . QUEtiapine (SEROQUEL) 100 MG tablet Take 1 tablet (100 mg total) by mouth at bedtime. 90 tablet 3  . torsemide (DEMADEX) 20 MG tablet Take 20 mg by mouth 4 (four) times daily.    Glory Rosebush Delica Lancets 86V MISC Test BS QID Dx E11.9 400 each 3   No current facility-administered medications for this visit.    Allergies:   Allopurinol, Oxycodone base, and Prozac [fluoxetine hcl]    ROS:  Please see the history of present illness.   Otherwise, review of systems are positive for none.   All other systems are reviewed and negative.    PHYSICAL EXAM: VS:  BP 127/78   Pulse 84   Ht 6' (1.829 m)   Wt (!) 349 lb (158.3 kg)   BMI 47.33 kg/m  , BMI Body mass index is 47.33 kg/m. GEN:  No distress NECK:  No jugular venous distention at 90 degrees, waveform within normal limits, carotid upstroke brisk and symmetric, no bruits, no thyromegaly  LUNGS:  Clear to auscultation bilaterally BACK:  No CVA tenderness CHEST:  Unremarkable HEART:  S1 and S2 within normal limits, no S3, no S4, no clicks, no rubs, no murmurs ABD:  Positive bowel sounds normal in frequency in pitch, no bruits, no rebound, no guarding, unable to assess midline mass or bruit with the patient seated. EXT:  2 plus pulses throughout, trace edema, no cyanosis no clubbing SKIN:  Eczema    EKG:  EKG is not ordered today.    Recent Labs: 05/14/2019: B Natriuretic Peptide 158.0; Magnesium 1.5 07/02/2019: ALT 15; BUN 17; Creatinine, Ser 0.96; Hemoglobin 10.5; Platelets 37; Potassium 3.6; Sodium 138    Lipid Panel    Component Value Date/Time   CHOL 154 02/17/2018 1055   CHOL 97 05/05/2012 1331   TRIG 149 02/17/2018 1055   TRIG 175 (H) 01/09/2013 1228   TRIG 107 05/05/2012 1331   HDL 40 02/17/2018 1055   HDL 38 (L) 01/09/2013 1228   HDL 30 (L) 05/05/2012 1331   CHOLHDL 3.9 02/17/2018 1055   LDLCALC 84 02/17/2018 1055   LDLCALC 78 01/09/2013 1228   LDLCALC 46 05/05/2012 1331      Wt Readings from Last 3 Encounters:  07/15/19 (!) 349 lb (158.3 kg)  07/08/19 (!) 338 lb 9.6 oz (153.6 kg)  06/10/19 (!) 334 lb (151.5 kg)      Other studies Reviewed: Additional studies/ records that were reviewed today include: Extensive review of hospital records from both May and April. Review of the above records demonstrates:  Please see elsewhere in the note.     ASSESSMENT AND PLAN:  Acute on chronic diastolic heart failure: He seems to be euvolemic.  We talked about as needed dosing of his diuretic.  He does not think he has any warning signs prior to acute exacerbation we talked about what these might be.  We talked about salt restriction and continue daily weights.  At this point no change in therapy.  Hypertension:    The blood pressure is well controlled.  No change in therapy.  Hyponatremia, hypokalemia, metabolic alkalosis:    He had his blood work checked in late May and everything was stable except for  low blood counts as below.  Continue current therapy.  I reviewed his blood work.  Chronic alcoholic cirrhosis with pancytopenia:   He is having follow-up with hematology and is due to get iron infusions apparently.  Type 2 diabetes mellitus:  A1C was 6.2.  No change in therapy.  Dyslipidemia: LDL is 84 with an HDL of 40.  No change in therapy.  Gout  flare:   He controls this now with diet.   Obesity class III: He understands need to lose weight with diet  PAF: He has PAF.  Unfortunately he is not an anticoagulation candidate with his history of marginal compliance, previous bleeding GI upper and ongoing problems with anemia and thrombocytopenia.  Covid education: We talked about this and gave him directions on how to get vaccine locally.  Current medicines are reviewed at length with the patient today.  The patient does not have concerns regarding medicines.  The following changes have been made:  no change  Labs/ tests ordered today include: None No orders of the defined types were placed in this encounter.    Disposition:   FU with me in one year.     Signed, Minus Breeding, MD  07/15/2019 3:47 PM    [facility]

## 2019-07-14 NOTE — Telephone Encounter (Signed)
Ok to refill this?

## 2019-07-14 NOTE — Telephone Encounter (Signed)
  Prescription Request  07/14/2019  What is the name of the medication or equipment? metoprolol tartrate (LOPRESSOR) 25 MG tablet [[id number]]   Have you contacted your pharmacy to request a refill? (if applicable) yes  Which pharmacy would you like this sent to? [facility] pt was prescribed this when he was in hospital   Patient notified that their request is being sent to the clinical staff for review and that they should receive a response within 2 business days.

## 2019-07-15 ENCOUNTER — Other Ambulatory Visit: Payer: Self-pay

## 2019-07-15 ENCOUNTER — Encounter: Payer: Self-pay | Admitting: Cardiology

## 2019-07-15 ENCOUNTER — Ambulatory Visit (INDEPENDENT_AMBULATORY_CARE_PROVIDER_SITE_OTHER): Payer: Medicare HMO | Admitting: Cardiology

## 2019-07-15 VITALS — BP 127/78 | HR 84 | Ht 72.0 in | Wt 349.0 lb

## 2019-07-15 DIAGNOSIS — E785 Hyperlipidemia, unspecified: Secondary | ICD-10-CM

## 2019-07-15 DIAGNOSIS — I480 Paroxysmal atrial fibrillation: Secondary | ICD-10-CM

## 2019-07-15 DIAGNOSIS — I5033 Acute on chronic diastolic (congestive) heart failure: Secondary | ICD-10-CM | POA: Diagnosis not present

## 2019-07-15 DIAGNOSIS — I10 Essential (primary) hypertension: Secondary | ICD-10-CM | POA: Diagnosis not present

## 2019-07-15 MED ORDER — DILTIAZEM HCL ER 90 MG PO CP12: 90.0000 mg | ORAL_CAPSULE | Freq: Every day | ORAL | 6 refills | Status: DC | PRN

## 2019-07-15 NOTE — Telephone Encounter (Signed)
Yes okay to go ahead and refill the metoprolol, you can give him 3 months worth

## 2019-07-15 NOTE — Patient Instructions (Signed)
Medication Instructions:  The current medical regimen is effective;  continue present plan and medications.  *If you need a refill on your cardiac medications before your next appointment, please call your pharmacy*  Follow-Up: At [facility], you and your health needs are our priority.  As part of our continuing mission to provide you with exceptional heart care, we have created designated Provider Care Teams.  These Care Teams include your primary Cardiologist (physician) and Advanced Practice Providers (APPs -  Physician Assistants and Nurse Practitioners) who all work together to provide you with the care you need, when you need it.  We recommend signing up for the patient portal called "MyChart".  Sign up information is provided on this After Visit Summary.  MyChart is used to connect with patients for Virtual Visits (Telemedicine).  Patients are able to view lab/test results, encounter notes, upcoming appointments, etc.  Non-urgent messages can be sent to your provider as well.   To learn more about what you can do with MyChart, go to [email or web address].    Your next appointment:   12 month(s)  The format for your next appointment:   In Person  Provider:   Minus Breeding, MD   Thank you for choosing [facility]!!

## 2019-07-16 ENCOUNTER — Other Ambulatory Visit: Payer: Self-pay | Admitting: *Deleted

## 2019-07-16 MED ORDER — METOPROLOL TARTRATE 25 MG PO TABS: 25.0000 mg | ORAL_TABLET | Freq: Two times a day (BID) | ORAL | 2 refills | Status: DC

## 2019-07-16 NOTE — Telephone Encounter (Signed)
Patient aware, script is ready. 

## 2019-07-16 NOTE — Progress Notes (Signed)
. °  Intravenous Iron Formulation Change  [name] has insurance that requires a change in intravenous iron product from Feraheme to Venofer. Orders have been updated to reflect this change and scheduling message sent to adjust infusion appointments. Francene Finders, NP notified and agrees with the plan.  Allergies:  Allergies  Allergen Reactions   Allopurinol Other (See Comments)    Joint pain worse    Oxycodone Base Itching   Prozac [Fluoxetine Hcl] Itching    The plan for iron therapy is as follows: Venofer 200 mg IVPB x 5 doses.  Wynona Neat 07/16/2019

## 2019-07-17 ENCOUNTER — Inpatient Hospital Stay (HOSPITAL_COMMUNITY): Payer: Medicare HMO | Attending: Hematology

## 2019-07-17 ENCOUNTER — Other Ambulatory Visit: Payer: Self-pay

## 2019-07-17 VITALS — BP 116/76 | HR 80 | Temp 97.5°F | Resp 18 | Wt 342.2 lb

## 2019-07-17 DIAGNOSIS — G8929 Other chronic pain: Secondary | ICD-10-CM | POA: Diagnosis not present

## 2019-07-17 DIAGNOSIS — M25511 Pain in right shoulder: Secondary | ICD-10-CM | POA: Diagnosis not present

## 2019-07-17 DIAGNOSIS — D509 Iron deficiency anemia, unspecified: Secondary | ICD-10-CM | POA: Insufficient documentation

## 2019-07-17 DIAGNOSIS — J449 Chronic obstructive pulmonary disease, unspecified: Secondary | ICD-10-CM | POA: Diagnosis not present

## 2019-07-17 DIAGNOSIS — E119 Type 2 diabetes mellitus without complications: Secondary | ICD-10-CM | POA: Diagnosis not present

## 2019-07-17 MED ORDER — ACETAMINOPHEN 325 MG PO TABS
650.0000 mg | ORAL_TABLET | Freq: Once | ORAL | Status: AC
  Administered 2019-07-17: 650 mg via ORAL

## 2019-07-17 MED ORDER — SODIUM CHLORIDE 0.9 % IV SOLN: Freq: Once | INTRAVENOUS | Status: AC

## 2019-07-17 MED ORDER — SODIUM CHLORIDE 0.9 % IV SOLN
200.0000 mg | Freq: Once | INTRAVENOUS | Status: AC
  Administered 2019-07-17: 200 mg via INTRAVENOUS
  Filled 2019-07-17: qty 200

## 2019-07-17 MED ORDER — LORATADINE 10 MG PO TABS
ORAL_TABLET | ORAL | Status: AC
  Filled 2019-07-17: qty 1

## 2019-07-17 MED ORDER — ACETAMINOPHEN 325 MG PO TABS
ORAL_TABLET | ORAL | Status: AC
  Filled 2019-07-17: qty 2

## 2019-07-17 MED ORDER — LORATADINE 10 MG PO TABS
10.0000 mg | ORAL_TABLET | Freq: Once | ORAL | Status: AC
  Administered 2019-07-17: 10 mg via ORAL

## 2019-07-17 NOTE — Progress Notes (Signed)
Iron infusion given per orders. Patient tolerated it well without problems. Vitals stable and discharged home from clinic via wheelchair. Follow up as scheduled.  

## 2019-07-17 NOTE — Patient Instructions (Signed)
[facility]  Discharge Instructions:   _______________________________________________________________  Thank you for choosing [facility] to provide your oncology and hematology care.  To afford each patient quality time with our providers, please arrive at least 15 minutes before your scheduled appointment.  You need to re-schedule your appointment if you arrive 10 or more minutes late.  We strive to give you quality time with our providers, and arriving late affects you and other patients whose appointments are after yours.  Also, if you no show three or more times for appointments you may be dismissed from the clinic.  Again, thank you for choosing [facility]. Our hope is that these requests will allow you access to exceptional care and in a timely manner. _______________________________________________________________  If you have questions after your visit, please contact our office at [phone] between the hours of 8:30 a.m. and 5:00 p.m. Voicemails left after 4:30 p.m. will not be returned until the following business day. _______________________________________________________________  For prescription refill requests, have your pharmacy contact our office. _______________________________________________________________  Recommendations made by the consultant and any test results will be sent to your referring physician. _______________________________________________________________ 

## 2019-07-22 ENCOUNTER — Other Ambulatory Visit (INDEPENDENT_AMBULATORY_CARE_PROVIDER_SITE_OTHER): Payer: Self-pay | Admitting: *Deleted

## 2019-07-22 ENCOUNTER — Ambulatory Visit (HOSPITAL_COMMUNITY): Payer: Medicare HMO

## 2019-07-22 DIAGNOSIS — K7460 Unspecified cirrhosis of liver: Secondary | ICD-10-CM

## 2019-07-24 ENCOUNTER — Inpatient Hospital Stay (HOSPITAL_COMMUNITY): Payer: Medicare HMO

## 2019-07-24 ENCOUNTER — Encounter (HOSPITAL_COMMUNITY): Payer: Self-pay

## 2019-07-24 ENCOUNTER — Other Ambulatory Visit: Payer: Self-pay

## 2019-07-24 VITALS — BP 102/51 | HR 63 | Temp 98.2°F | Resp 18

## 2019-07-24 DIAGNOSIS — D509 Iron deficiency anemia, unspecified: Secondary | ICD-10-CM

## 2019-07-24 MED ORDER — LORATADINE 10 MG PO TABS
10.0000 mg | ORAL_TABLET | Freq: Once | ORAL | Status: AC
  Administered 2019-07-24: 10 mg via ORAL
  Filled 2019-07-24: qty 1

## 2019-07-24 MED ORDER — ACETAMINOPHEN 325 MG PO TABS
650.0000 mg | ORAL_TABLET | Freq: Once | ORAL | Status: DC
  Filled 2019-07-24: qty 2

## 2019-07-24 MED ORDER — SODIUM CHLORIDE 0.9 % IV SOLN
200.0000 mg | Freq: Once | INTRAVENOUS | Status: AC
  Administered 2019-07-24: 200 mg via INTRAVENOUS
  Filled 2019-07-24: qty 200

## 2019-07-24 MED ORDER — SODIUM CHLORIDE 0.9 % IV SOLN: Freq: Once | INTRAVENOUS | Status: AC

## 2019-07-24 NOTE — Progress Notes (Signed)
Iron infusion given per orders. Patient tolerated it well without problems. Vitals stable and discharged home from clinic via wheelchair. Follow up as scheduled.  

## 2019-07-26 ENCOUNTER — Other Ambulatory Visit: Payer: Self-pay | Admitting: Family Medicine

## 2019-07-27 ENCOUNTER — Ambulatory Visit (HOSPITAL_COMMUNITY): Payer: Medicare HMO

## 2019-07-28 ENCOUNTER — Other Ambulatory Visit: Payer: Self-pay | Admitting: Family Medicine

## 2019-07-28 ENCOUNTER — Ambulatory Visit (INDEPENDENT_AMBULATORY_CARE_PROVIDER_SITE_OTHER): Payer: Medicare HMO | Admitting: Nurse Practitioner

## 2019-07-28 ENCOUNTER — Encounter: Payer: Self-pay | Admitting: Nurse Practitioner

## 2019-07-28 ENCOUNTER — Telehealth (INDEPENDENT_AMBULATORY_CARE_PROVIDER_SITE_OTHER): Payer: Self-pay | Admitting: Internal Medicine

## 2019-07-28 DIAGNOSIS — A084 Viral intestinal infection, unspecified: Secondary | ICD-10-CM

## 2019-07-28 DIAGNOSIS — G4733 Obstructive sleep apnea (adult) (pediatric): Secondary | ICD-10-CM | POA: Diagnosis not present

## 2019-07-28 DIAGNOSIS — J449 Chronic obstructive pulmonary disease, unspecified: Secondary | ICD-10-CM | POA: Diagnosis not present

## 2019-07-28 DIAGNOSIS — E1165 Type 2 diabetes mellitus with hyperglycemia: Secondary | ICD-10-CM

## 2019-07-28 NOTE — Telephone Encounter (Signed)
Patient left voice mail message wanting to know if we could fax orders for lab work over to [facility] - he plans to go tomorrow (Wednesday)  Please advise - [phone]

## 2019-07-28 NOTE — Progress Notes (Signed)
Attempted to call patient 3 times during uch and left 3 messages. Called again at 4:15 and left message.    Virtual Visit via telephone Note Due to COVID-19 pandemic this visit was conducted virtually. This visit type was conducted due to national recommendations for restrictions regarding the COVID-19 Pandemic (e.g. social distancing, sheltering in place) in an effort to limit this patient's exposure and mitigate transmission in our community. All issues noted in this document were discussed and addressed.  A physical exam was not performed with this format.  I connected with Draden Cottingham Todorov on 07/28/19 at 4:10 by telephone and verified that I am speaking with the correct person using two identifiers. Overton Boggus Clock is currently located at home and  No one is currently with him during visit. The provider, Mary-[name] Hassell Done, FNP is located in their office at time of visit.  I discussed the limitations, risks, security and privacy concerns of performing an evaluation and management service by telephone and the availability of in person appointments. I also discussed with the patient that there may be a patient responsible charge related to this service. The patient expressed understanding and agreed to proceed.   History and Present Illness:   Chief Complaint: Nausea   HPI Patient calls wanting to know if he could have a salt overload. He was vomiting and had diarrhea the last 2 days, He is better today. No vomiting or diarrhea since yesterday afternoon. Just feels tired.    Review of Systems  Constitutional: Negative for chills and fever.  HENT: Negative.   Respiratory: Negative.   Cardiovascular: Negative.   Gastrointestinal: Positive for diarrhea, nausea and vomiting. Negative for abdominal pain, blood in stool and constipation.  Psychiatric/Behavioral: Negative.   All other systems reviewed and are negative.    Observations/Objective: Alert and oriented- answers all  questions appropriately No distress          Assessment and Plan: Johnell D Qualls in today with chief complaint of Nausea   1. Viral gastroenteritis First 24 Hours-Clear liquids  popsicles  Jello  gatorade  Sprite Second 24 hours-Add Full liquids ( Liquids you cant see through) Third 24 hours- Bland diet ( foods that are baked or broiled)  *avoiding fried foods and highly spiced foods* During these 3 days  Avoid milk, cheese, ice cream or any other dairy products  Avoid caffeine- REMEMBER Mt. Dew and Mello Yellow contain lots of caffeine You should eat and drink in  Frequent small volumes If no improvement in symptoms or worsen in 2-3 days should RETRUN TO OFFICE or go to ER!     Follow Up Instructions: prn    I discussed the assessment and treatment plan with the patient. The patient was provided an opportunity to ask questions and all were answered. The patient agreed with the plan and demonstrated an understanding of the instructions.   The patient was advised to call back or seek an in-person evaluation if the symptoms worsen or if the condition fails to improve as anticipated.  The above assessment and management plan was discussed with the patient. The patient verbalized understanding of and has agreed to the management plan. Patient is aware to call the clinic if symptoms persist or worsen. Patient is aware when to return to the clinic for a follow-up visit. Patient educated on when it is appropriate to go to the emergency department.   Time call ended:  4:25  I provided 15 minutes of non-face-to-face time during this encounter.  Mary-[name] Hassell Done, FNP

## 2019-07-28 NOTE — Telephone Encounter (Signed)
This has been faxed to [facility].

## 2019-07-29 ENCOUNTER — Other Ambulatory Visit: Payer: Self-pay

## 2019-07-29 ENCOUNTER — Telehealth: Payer: Self-pay | Admitting: Family Medicine

## 2019-07-29 ENCOUNTER — Telehealth (INDEPENDENT_AMBULATORY_CARE_PROVIDER_SITE_OTHER): Payer: Self-pay | Admitting: *Deleted

## 2019-07-29 ENCOUNTER — Other Ambulatory Visit (HOSPITAL_COMMUNITY)
Admission: RE | Admit: 2019-07-29 | Discharge: 2019-07-29 | Disposition: A | Discharge status: Home / Self Care | Payer: Medicare HMO | Source: Ambulatory Visit | Attending: Gastroenterology | Admitting: Gastroenterology

## 2019-07-29 ENCOUNTER — Inpatient Hospital Stay (HOSPITAL_COMMUNITY): Payer: Medicare HMO

## 2019-07-29 VITALS — BP 119/77 | HR 73 | Temp 98.3°F | Resp 18

## 2019-07-29 DIAGNOSIS — K7460 Unspecified cirrhosis of liver: Secondary | ICD-10-CM

## 2019-07-29 DIAGNOSIS — D509 Iron deficiency anemia, unspecified: Secondary | ICD-10-CM

## 2019-07-29 DIAGNOSIS — E876 Hypokalemia: Secondary | ICD-10-CM

## 2019-07-29 LAB — COMPREHENSIVE METABOLIC PANEL
ALT: 18 U/L (ref 0–44)
AST: 43 U/L — ABNORMAL HIGH (ref 15–41)
Albumin: 3.4 g/dL — ABNORMAL LOW (ref 3.5–5.0)
Alkaline Phosphatase: 91 U/L (ref 38–126)
Anion gap: 15 (ref 5–15)
BUN: 13 mg/dL (ref 6–20)
CO2: 29 mmol/L (ref 22–32)
Calcium: 8.2 mg/dL — ABNORMAL LOW (ref 8.9–10.3)
Chloride: 91 mmol/L — ABNORMAL LOW (ref 98–111)
Creatinine, Ser: 0.87 mg/dL (ref 0.61–1.24)
GFR calc Af Amer: >60 mL/min (ref >60)
GFR calc non Af Amer: >60 mL/min (ref >60)
Glucose, Bld: 95 mg/dL (ref 70–99)
Potassium: 2.4 mmol/L — CL (ref 3.5–5.1)
Sodium: 135 mmol/L (ref 135–145)
Total Bilirubin: 1.1 mg/dL (ref 0.3–1.2)
Total Protein: 7.2 g/dL (ref 6.5–8.1)

## 2019-07-29 LAB — CBC WITH DIFFERENTIAL/PLATELET
Abs Immature Granulocytes: 0 10*3/uL (ref 0.00–0.07)
Basophils Absolute: 0 10*3/uL (ref 0.0–0.1)
Basophils Relative: 1 %
Eosinophils Absolute: 0.1 10*3/uL (ref 0.0–0.5)
Eosinophils Relative: 6 %
HCT: 35.6 % — ABNORMAL LOW (ref 39.0–52.0)
Hemoglobin: 11.8 g/dL — ABNORMAL LOW (ref 13.0–17.0)
Immature Granulocytes: 0 %
Lymphocytes Relative: 22 %
Lymphs Abs: 0.5 10*3/uL — ABNORMAL LOW (ref 0.7–4.0)
MCH: 27.5 pg (ref 26.0–34.0)
MCHC: 33.1 g/dL (ref 30.0–36.0)
MCV: 83 fL (ref 80.0–100.0)
Monocytes Absolute: 0.3 10*3/uL (ref 0.1–1.0)
Monocytes Relative: 11 %
Neutro Abs: 1.4 10*3/uL — ABNORMAL LOW (ref 1.7–7.7)
Neutrophils Relative %: 60 %
Platelets: 45 10*3/uL — ABNORMAL LOW (ref 150–400)
RBC: 4.29 MIL/uL (ref 4.22–5.81)
RDW: 15.7 % — ABNORMAL HIGH (ref 11.5–15.5)
WBC: 2.4 10*3/uL — ABNORMAL LOW (ref 4.0–10.5)
nRBC: 0 % (ref 0.0–0.2)

## 2019-07-29 LAB — AMMONIA: Ammonia: 51 umol/L — ABNORMAL HIGH (ref 9–35)

## 2019-07-29 LAB — PROTIME-INR
INR: 1.1 (ref 0.8–1.2)
Prothrombin Time: 14 seconds (ref 11.4–15.2)

## 2019-07-29 MED ORDER — LORATADINE 10 MG PO TABS
10.0000 mg | ORAL_TABLET | Freq: Once | ORAL | Status: AC
  Administered 2019-07-29: 10 mg via ORAL
  Filled 2019-07-29: qty 1

## 2019-07-29 MED ORDER — ACETAMINOPHEN 325 MG PO TABS
650.0000 mg | ORAL_TABLET | Freq: Once | ORAL | Status: AC
  Administered 2019-07-29: 650 mg via ORAL
  Filled 2019-07-29: qty 2

## 2019-07-29 MED ORDER — SODIUM CHLORIDE 0.9 % IV SOLN: Freq: Once | INTRAVENOUS | Status: AC

## 2019-07-29 MED ORDER — SODIUM CHLORIDE 0.9 % IV SOLN
200.0000 mg | Freq: Once | INTRAVENOUS | Status: AC
  Administered 2019-07-29: 200 mg via INTRAVENOUS
  Filled 2019-07-29: qty 200

## 2019-07-29 NOTE — Telephone Encounter (Signed)
Notify patient I discussed his potassium level with Dr. Laural Golden.  Recommend him to take 15mq twice a day until Monday. He would like to repeat a BMP and magnesium level on Monday. Will put orders in for patient for labs  He should have some potassium supplements at home but please let me know if needs to send more to pharmacy. Thanks.

## 2019-07-29 NOTE — Telephone Encounter (Signed)
Pt aware refill sent this morning. Appt made for 09/10/19

## 2019-07-29 NOTE — Telephone Encounter (Signed)
Patient had lab work drawn today that was ordered by Laurine Blazer , PA C in June. We received a call from the lab with a critical K+ level of 2.4. This result was given to Dr. Laural Golden and to Laurine Blazer .  She ask that I find out from the patient if he was taking the Potassium and what fluid medications he is taking.  Per patient he is taking K+ 20 mEq 3 per day , Torsemide 20 mg QID , Metolazone 2.5 mg up to 3 times a week, depending on what his weights are. Sometimes it may be 1-2 per week.  He was on his way home and stated that he would take 2 K+ when he got home. I ask that he wait until the PA called and addressed this with him.

## 2019-07-29 NOTE — Telephone Encounter (Signed)
°  Prescription Request  07/29/2019  What is the name of the medication or equipment? Gabapentin  Have you contacted your pharmacy to request a refill? (if applicable) Yes  Which pharmacy would you like this sent to? [facility]   Patient notified that their request is being sent to the clinical staff for review and that they should receive a response within 2 business days.   Dettinger's pt  He is completely out.  Potassium Levels?  Please call pt.  He has question about his foot, too.

## 2019-07-29 NOTE — Addendum Note (Signed)
Addended by: Laurine Blazer A on: 07/29/2019 04:36 PM   Modules accepted: Orders

## 2019-07-29 NOTE — Progress Notes (Signed)
Patient tolerated iron infusion with no complaints voiced.  Peripheral IV site clean and dry with good blood return noted before and after infusion.  Band aid applied.  VSS with discharge and left in satisfactory condition with no s/s of distress noted.

## 2019-07-30 ENCOUNTER — Other Ambulatory Visit (INDEPENDENT_AMBULATORY_CARE_PROVIDER_SITE_OTHER): Payer: Self-pay | Admitting: *Deleted

## 2019-07-30 DIAGNOSIS — E876 Hypokalemia: Secondary | ICD-10-CM

## 2019-07-30 LAB — AFP TUMOR MARKER: AFP, Serum, Tumor Marker: 5.3 ng/mL (ref 0.0–8.3)

## 2019-07-30 MED ORDER — POTASSIUM CHLORIDE CRYS ER 20 MEQ PO TBCR: 40.0000 meq | EXTENDED_RELEASE_TABLET | Freq: Two times a day (BID) | ORAL | 0 refills | Status: DC

## 2019-07-30 NOTE — Telephone Encounter (Signed)
Patient was called and given instructions per Katharina Caper Lab order faxed to [facility].  Patient states that he will need a script for his potassium sent to his pharmacy.

## 2019-07-30 NOTE — Telephone Encounter (Signed)
Patient was made aware

## 2019-07-30 NOTE — Telephone Encounter (Signed)
Please advise patient I sent the potassium to his pharmacy.  Very important to take this as his potassium was critically low.  Thanks

## 2019-07-30 NOTE — Addendum Note (Signed)
Addended by: Laurine Blazer A on: 07/30/2019 08:51 AM   Modules accepted: Orders

## 2019-07-31 ENCOUNTER — Encounter (HOSPITAL_COMMUNITY): Payer: Self-pay

## 2019-07-31 ENCOUNTER — Inpatient Hospital Stay (HOSPITAL_COMMUNITY): Payer: Medicare HMO

## 2019-07-31 ENCOUNTER — Other Ambulatory Visit: Payer: Self-pay

## 2019-07-31 VITALS — BP 114/69 | HR 69 | Temp 96.9°F | Resp 18

## 2019-07-31 DIAGNOSIS — D509 Iron deficiency anemia, unspecified: Secondary | ICD-10-CM

## 2019-07-31 MED ORDER — LORATADINE 10 MG PO TABS
10.0000 mg | ORAL_TABLET | Freq: Once | ORAL | Status: AC
  Administered 2019-07-31: 10 mg via ORAL
  Filled 2019-07-31: qty 1

## 2019-07-31 MED ORDER — SODIUM CHLORIDE 0.9 % IV SOLN: Freq: Once | INTRAVENOUS | Status: AC

## 2019-07-31 MED ORDER — ACETAMINOPHEN 325 MG PO TABS
650.0000 mg | ORAL_TABLET | Freq: Once | ORAL | Status: AC
  Administered 2019-07-31: 650 mg via ORAL
  Filled 2019-07-31: qty 2

## 2019-07-31 MED ORDER — SODIUM CHLORIDE 0.9 % IV SOLN
200.0000 mg | Freq: Once | INTRAVENOUS | Status: AC
  Administered 2019-07-31: 200 mg via INTRAVENOUS
  Filled 2019-07-31: qty 200

## 2019-07-31 NOTE — Progress Notes (Signed)
[name] tolerated Venofer infusion well without complaints or incident. Peripheral IV site checked with positive blood return noted prior to and after infusion. VSS upon discharge. Pt discharged via wheelchair in satisfactory condition

## 2019-07-31 NOTE — Patient Instructions (Signed)
[place] at [facility] Discharge Instructions  Received Venofer infusion today. Follow-up as scheduled   Thank you for choosing [place] at [facility] to provide your oncology and hematology care.  To afford each patient quality time with our provider, please arrive at least 15 minutes before your scheduled appointment time.   If you have a lab appointment with the [facility] please come in thru the Main Entrance and check in at the main information desk.  You need to re-schedule your appointment should you arrive 10 or more minutes late.  We strive to give you quality time with our providers, and arriving late affects you and other patients whose appointments are after yours.  Also, if you no show three or more times for appointments you may be dismissed from the clinic at the providers discretion.     Again, thank you for choosing [facility].  Our hope is that these requests will decrease the amount of time that you wait before being seen by our physicians.       _____________________________________________________________  Should you have questions after your visit to [facility], please contact our office at [phone] and follow the prompts.  Our office hours are 8:00 a.m. and 4:30 p.m. Monday - Friday.  Please note that voicemails left after 4:00 p.m. may not be returned until the following business day.  We are closed weekends and major holidays.  You do have access to a nurse 24-7, just call the main number to the clinic [phone] and do not press any options, hold on the line and a nurse will answer the phone.    For prescription refill requests, have your pharmacy contact our office and allow 72 hours.    Due to Covid, you will need to wear a mask upon entering the hospital. If you do not have a mask, a mask will be given to you at the Main Entrance upon arrival. For doctor visits, patients may have  1 support person age 50 or older with them. For treatment visits, patients can not have anyone with them due to social distancing guidelines and our immunocompromised population.

## 2019-08-03 ENCOUNTER — Inpatient Hospital Stay (HOSPITAL_COMMUNITY): Payer: Medicare HMO

## 2019-08-03 ENCOUNTER — Other Ambulatory Visit (HOSPITAL_COMMUNITY)
Admission: RE | Admit: 2019-08-03 | Discharge: 2019-08-03 | Disposition: A | Discharge status: Home / Self Care | Payer: Medicare HMO | Source: Ambulatory Visit | Attending: Gastroenterology | Admitting: Gastroenterology

## 2019-08-03 ENCOUNTER — Encounter (HOSPITAL_COMMUNITY): Payer: Self-pay

## 2019-08-03 ENCOUNTER — Other Ambulatory Visit: Payer: Self-pay

## 2019-08-03 VITALS — BP 79/45 | HR 72 | Temp 97.1°F | Resp 16

## 2019-08-03 DIAGNOSIS — K7460 Unspecified cirrhosis of liver: Secondary | ICD-10-CM | POA: Insufficient documentation

## 2019-08-03 DIAGNOSIS — D509 Iron deficiency anemia, unspecified: Secondary | ICD-10-CM | POA: Diagnosis not present

## 2019-08-03 DIAGNOSIS — E876 Hypokalemia: Secondary | ICD-10-CM | POA: Diagnosis not present

## 2019-08-03 LAB — BASIC METABOLIC PANEL
Anion gap: 13 (ref 5–15)
BUN: 19 mg/dL (ref 6–20)
CO2: 26 mmol/L (ref 22–32)
Calcium: 8 mg/dL — ABNORMAL LOW (ref 8.9–10.3)
Chloride: 98 mmol/L (ref 98–111)
Creatinine, Ser: 1.03 mg/dL (ref 0.61–1.24)
GFR calc Af Amer: >60 mL/min (ref >60)
GFR calc non Af Amer: >60 mL/min (ref >60)
Glucose, Bld: 153 mg/dL — ABNORMAL HIGH (ref 70–99)
Potassium: 3.1 mmol/L — ABNORMAL LOW (ref 3.5–5.1)
Sodium: 137 mmol/L (ref 135–145)

## 2019-08-03 LAB — MAGNESIUM: Magnesium: 1.3 mg/dL — ABNORMAL LOW (ref 1.7–2.4)

## 2019-08-03 MED ORDER — SODIUM CHLORIDE 0.9 % IV SOLN
200.0000 mg | Freq: Once | INTRAVENOUS | Status: AC
  Administered 2019-08-03: 200 mg via INTRAVENOUS
  Filled 2019-08-03: qty 200

## 2019-08-03 MED ORDER — ACETAMINOPHEN 325 MG PO TABS
650.0000 mg | ORAL_TABLET | Freq: Once | ORAL | Status: AC
  Administered 2019-08-03: 650 mg via ORAL
  Filled 2019-08-03: qty 2

## 2019-08-03 MED ORDER — LORATADINE 10 MG PO TABS
10.0000 mg | ORAL_TABLET | Freq: Once | ORAL | Status: AC
  Administered 2019-08-03: 10 mg via ORAL
  Filled 2019-08-03: qty 1

## 2019-08-03 MED ORDER — SODIUM CHLORIDE 0.9 % IV SOLN: Freq: Once | INTRAVENOUS | Status: AC

## 2019-08-03 NOTE — Patient Instructions (Signed)
[facility] at [facility]  Discharge Instructions:   _______________________________________________________________  Thank you for choosing [facility] at [facility] to provide your oncology and hematology care.  To afford each patient quality time with our providers, please arrive at least 15 minutes before your scheduled appointment.  You need to re-schedule your appointment if you arrive 10 or more minutes late.  We strive to give you quality time with our providers, and arriving late affects you and other patients whose appointments are after yours.  Also, if you no show three or more times for appointments you may be dismissed from the clinic.  Again, thank you for choosing [facility] at [facility]. Our hope is that these requests will allow you access to exceptional care and in a timely manner. _______________________________________________________________  If you have questions after your visit, please contact our office at [phone] between the hours of 8:30 a.m. and 5:00 p.m. Voicemails left after 4:30 p.m. will not be returned until the following business day. _______________________________________________________________  For prescription refill requests, have your pharmacy contact our office. _______________________________________________________________  Recommendations made by the consultant and any test results will be sent to your referring physician. _______________________________________________________________ 

## 2019-08-03 NOTE — Progress Notes (Signed)
Blood pressure is 79/42.  Dr Raliegh Ip notified. 552m bolus given.    Notified of B/P 79/45.  No symptoms of low pressure.  Pt discharged in wheelchair.  Tolerated iron infusion without incident.

## 2019-08-04 ENCOUNTER — Other Ambulatory Visit (INDEPENDENT_AMBULATORY_CARE_PROVIDER_SITE_OTHER): Payer: Self-pay | Admitting: Gastroenterology

## 2019-08-04 DIAGNOSIS — M14671 Charcot's joint, right ankle and foot: Secondary | ICD-10-CM | POA: Diagnosis not present

## 2019-08-04 DIAGNOSIS — G8929 Other chronic pain: Secondary | ICD-10-CM | POA: Diagnosis not present

## 2019-08-04 DIAGNOSIS — R7401 Elevation of levels of liver transaminase levels: Secondary | ICD-10-CM | POA: Diagnosis present

## 2019-08-04 DIAGNOSIS — M25571 Pain in right ankle and joints of right foot: Secondary | ICD-10-CM | POA: Diagnosis not present

## 2019-08-04 DIAGNOSIS — E876 Hypokalemia: Secondary | ICD-10-CM

## 2019-08-04 MED ORDER — MAGNESIUM OXIDE -MG SUPPLEMENT 400 MG PO CAPS: 400.0000 mg | ORAL_CAPSULE | Freq: Every day | ORAL | 1 refills | Status: DC

## 2019-08-05 ENCOUNTER — Other Ambulatory Visit (INDEPENDENT_AMBULATORY_CARE_PROVIDER_SITE_OTHER): Payer: Self-pay | Admitting: *Deleted

## 2019-08-05 DIAGNOSIS — K7460 Unspecified cirrhosis of liver: Secondary | ICD-10-CM

## 2019-08-05 DIAGNOSIS — E876 Hypokalemia: Secondary | ICD-10-CM

## 2019-08-10 ENCOUNTER — Other Ambulatory Visit: Payer: Self-pay | Admitting: Family Medicine

## 2019-08-10 ENCOUNTER — Other Ambulatory Visit: Payer: Self-pay

## 2019-08-10 ENCOUNTER — Ambulatory Visit (INDEPENDENT_AMBULATORY_CARE_PROVIDER_SITE_OTHER): Payer: Medicare HMO | Admitting: *Deleted

## 2019-08-10 DIAGNOSIS — Z9189 Other specified personal risk factors, not elsewhere classified: Secondary | ICD-10-CM

## 2019-08-10 DIAGNOSIS — E1165 Type 2 diabetes mellitus with hyperglycemia: Secondary | ICD-10-CM

## 2019-08-10 DIAGNOSIS — Z23 Encounter for immunization: Secondary | ICD-10-CM

## 2019-08-10 NOTE — Progress Notes (Signed)
Boostrix given and tolerated well

## 2019-08-10 NOTE — Patient Instructions (Signed)
https://www.cdc.gov/vaccines/hcp/vis/vis-statements/tdap.pdf">  Tdap (Tetanus, Diphtheria, Pertussis) Vaccine: What You Need to Know 1. Why get vaccinated? Tdap vaccine can prevent tetanus, diphtheria, and pertussis. Diphtheria and pertussis spread from person to person. Tetanus enters the body through cuts or wounds.  TETANUS (T) causes painful stiffening of the muscles. Tetanus can lead to serious health problems, including being unable to open the mouth, having trouble swallowing and breathing, or death.  DIPHTHERIA (D) can lead to difficulty breathing, heart failure, paralysis, or death.  PERTUSSIS (aP), also known as "whooping cough," can cause uncontrollable, violent coughing which makes it hard to breathe, eat, or drink. Pertussis can be extremely serious in babies and young children, causing pneumonia, convulsions, brain damage, or death. In teens and adults, it can cause weight loss, loss of bladder control, passing out, and rib fractures from severe coughing. 2. Tdap vaccine Tdap is only for children 7 years and older, adolescents, and adults.  Adolescents should receive a single dose of Tdap, preferably at age 30 or 53 years. Pregnant women should get a dose of Tdap during every pregnancy, to protect the newborn from pertussis. Infants are most at risk for severe, life-threatening complications from pertussis. Adults who have never received Tdap should get a dose of Tdap. Also, adults should receive a booster dose every 10 years, or earlier in the case of a severe and dirty wound or burn. Booster doses can be either Tdap or Td (a different vaccine that protects against tetanus and diphtheria but not pertussis). Tdap may be given at the same time as other vaccines. 3. Talk with your health care provider Tell your vaccine provider if the person getting the vaccine:  Has had an allergic reaction after a previous dose of any vaccine that protects against tetanus, diphtheria, or pertussis,  or has any severe, life-threatening allergies.  Has had a coma, decreased level of consciousness, or prolonged seizures within 7 days after a previous dose of any pertussis vaccine (DTP, DTaP, or Tdap).  Has seizures or another nervous system problem.  Has ever had Guillain-Barr Syndrome (also called GBS).  Has had severe pain or swelling after a previous dose of any vaccine that protects against tetanus or diphtheria. In some cases, your health care provider may decide to postpone Tdap vaccination to a future visit.  People with minor illnesses, such as a cold, may be vaccinated. People who are moderately or severely ill should usually wait until they recover before getting Tdap vaccine.  Your health care provider can give you more information. 4. Risks of a vaccine reaction  Pain, redness, or swelling where the shot was given, mild fever, headache, feeling tired, and nausea, vomiting, diarrhea, or stomachache sometimes happen after Tdap vaccine. People sometimes faint after medical procedures, including vaccination. Tell your provider if you feel dizzy or have vision changes or ringing in the ears.  As with any medicine, there is a very remote chance of a vaccine causing a severe allergic reaction, other serious injury, or death. 5. What if there is a serious problem? An allergic reaction could occur after the vaccinated person leaves the clinic. If you see signs of a severe allergic reaction (hives, swelling of the face and throat, difficulty breathing, a fast heartbeat, dizziness, or weakness), call 9-1-1 and get the person to the nearest hospital. For other signs that concern you, call your health care provider.  Adverse reactions should be reported to the Vaccine Adverse Event Reporting System (VAERS). Your health care provider will usually file this report,  or you can do it yourself. Visit the VAERS website at www.vaers.SamedayNews.es or call 218-308-3452. VAERS is only for reporting  reactions, and VAERS staff do not give medical advice. 6. The National Vaccine Injury Compensation Program The [employer] Vaccine Injury Compensation Program (VICP) is a federal program that was created to compensate people who may have been injured by certain vaccines. Visit the VICP website at GoldCloset.com.ee or call 6302929242 to learn about the program and about filing a claim. There is a time limit to file a claim for compensation. 7. How can I learn more?  Ask your health care provider.  Call your local or state health department.  Contact the Centers for Disease Control and Prevention (CDC): ? Call 681-873-6402 (1-800-CDC-INFO) or ? Visit CDC's website at http://hunter.com/ Vaccine Information Statement Tdap (Tetanus, Diphtheria, Pertussis) Vaccine (04/09/2018) This information is not intended to replace advice given to you by your health care provider. Make sure you discuss any questions you have with your health care provider. Document Revised: 04/18/2018 Document Reviewed: 04/21/2018 Elsevier Patient Education  [place].

## 2019-08-12 ENCOUNTER — Other Ambulatory Visit: Payer: Self-pay | Admitting: Family Medicine

## 2019-08-12 DIAGNOSIS — Z79891 Long term (current) use of opiate analgesic: Secondary | ICD-10-CM | POA: Diagnosis not present

## 2019-08-12 DIAGNOSIS — M545 Low back pain: Secondary | ICD-10-CM | POA: Diagnosis not present

## 2019-08-12 DIAGNOSIS — G894 Chronic pain syndrome: Secondary | ICD-10-CM | POA: Diagnosis not present

## 2019-08-12 DIAGNOSIS — G609 Hereditary and idiopathic neuropathy, unspecified: Secondary | ICD-10-CM | POA: Diagnosis not present

## 2019-08-12 DIAGNOSIS — M130 Polyarthritis, unspecified: Secondary | ICD-10-CM | POA: Diagnosis not present

## 2019-08-12 DIAGNOSIS — G5712 Meralgia paresthetica, left lower limb: Secondary | ICD-10-CM | POA: Diagnosis not present

## 2019-08-12 DIAGNOSIS — G4733 Obstructive sleep apnea (adult) (pediatric): Secondary | ICD-10-CM | POA: Diagnosis not present

## 2019-08-12 DIAGNOSIS — F410 Panic disorder [episodic paroxysmal anxiety] without agoraphobia: Secondary | ICD-10-CM

## 2019-08-14 DIAGNOSIS — J449 Chronic obstructive pulmonary disease, unspecified: Secondary | ICD-10-CM | POA: Diagnosis not present

## 2019-08-14 DIAGNOSIS — E119 Type 2 diabetes mellitus without complications: Secondary | ICD-10-CM | POA: Diagnosis not present

## 2019-08-14 DIAGNOSIS — M109 Gout, unspecified: Secondary | ICD-10-CM | POA: Diagnosis not present

## 2019-08-16 DIAGNOSIS — R509 Fever, unspecified: Secondary | ICD-10-CM | POA: Insufficient documentation

## 2019-08-16 DIAGNOSIS — R8290 Unspecified abnormal findings in urine: Secondary | ICD-10-CM | POA: Diagnosis not present

## 2019-08-16 DIAGNOSIS — R000 Tachycardia, unspecified: Secondary | ICD-10-CM | POA: Diagnosis not present

## 2019-08-16 DIAGNOSIS — R52 Pain, unspecified: Secondary | ICD-10-CM | POA: Diagnosis not present

## 2019-08-16 DIAGNOSIS — I440 Atrioventricular block, first degree: Secondary | ICD-10-CM | POA: Diagnosis not present

## 2019-08-16 DIAGNOSIS — M25511 Pain in right shoulder: Secondary | ICD-10-CM | POA: Diagnosis not present

## 2019-08-17 DIAGNOSIS — I5030 Unspecified diastolic (congestive) heart failure: Secondary | ICD-10-CM | POA: Diagnosis not present

## 2019-08-17 DIAGNOSIS — G4733 Obstructive sleep apnea (adult) (pediatric): Secondary | ICD-10-CM | POA: Diagnosis not present

## 2019-08-17 DIAGNOSIS — R5081 Fever presenting with conditions classified elsewhere: Secondary | ICD-10-CM | POA: Diagnosis not present

## 2019-08-17 DIAGNOSIS — D709 Neutropenia, unspecified: Secondary | ICD-10-CM | POA: Diagnosis not present

## 2019-08-17 DIAGNOSIS — R509 Fever, unspecified: Secondary | ICD-10-CM | POA: Diagnosis not present

## 2019-08-17 DIAGNOSIS — M25512 Pain in left shoulder: Secondary | ICD-10-CM | POA: Diagnosis not present

## 2019-08-17 DIAGNOSIS — I440 Atrioventricular block, first degree: Secondary | ICD-10-CM | POA: Insufficient documentation

## 2019-08-17 DIAGNOSIS — I480 Paroxysmal atrial fibrillation: Secondary | ICD-10-CM | POA: Diagnosis not present

## 2019-08-17 DIAGNOSIS — I4891 Unspecified atrial fibrillation: Secondary | ICD-10-CM | POA: Diagnosis not present

## 2019-08-17 DIAGNOSIS — A6920 Lyme disease, unspecified: Secondary | ICD-10-CM | POA: Insufficient documentation

## 2019-08-17 DIAGNOSIS — M25511 Pain in right shoulder: Secondary | ICD-10-CM | POA: Diagnosis not present

## 2019-08-17 DIAGNOSIS — G8929 Other chronic pain: Secondary | ICD-10-CM | POA: Diagnosis not present

## 2019-08-18 DIAGNOSIS — D473 Essential (hemorrhagic) thrombocythemia: Secondary | ICD-10-CM | POA: Diagnosis not present

## 2019-08-18 DIAGNOSIS — I4891 Unspecified atrial fibrillation: Secondary | ICD-10-CM | POA: Diagnosis not present

## 2019-08-18 DIAGNOSIS — K7460 Unspecified cirrhosis of liver: Secondary | ICD-10-CM | POA: Diagnosis not present

## 2019-08-18 DIAGNOSIS — E119 Type 2 diabetes mellitus without complications: Secondary | ICD-10-CM | POA: Diagnosis not present

## 2019-08-18 DIAGNOSIS — R509 Fever, unspecified: Secondary | ICD-10-CM | POA: Diagnosis not present

## 2019-08-19 DIAGNOSIS — D696 Thrombocytopenia, unspecified: Secondary | ICD-10-CM | POA: Diagnosis not present

## 2019-08-19 DIAGNOSIS — R509 Fever, unspecified: Secondary | ICD-10-CM | POA: Diagnosis not present

## 2019-08-19 DIAGNOSIS — E119 Type 2 diabetes mellitus without complications: Secondary | ICD-10-CM | POA: Diagnosis not present

## 2019-08-19 DIAGNOSIS — K7460 Unspecified cirrhosis of liver: Secondary | ICD-10-CM | POA: Diagnosis not present

## 2019-08-19 DIAGNOSIS — B9620 Unspecified Escherichia coli [E. coli] as the cause of diseases classified elsewhere: Secondary | ICD-10-CM | POA: Diagnosis not present

## 2019-08-19 DIAGNOSIS — R7881 Bacteremia: Secondary | ICD-10-CM | POA: Diagnosis not present

## 2019-08-19 DIAGNOSIS — I4891 Unspecified atrial fibrillation: Secondary | ICD-10-CM | POA: Diagnosis not present

## 2019-08-19 DIAGNOSIS — I480 Paroxysmal atrial fibrillation: Secondary | ICD-10-CM | POA: Diagnosis not present

## 2019-08-19 DIAGNOSIS — D709 Neutropenia, unspecified: Secondary | ICD-10-CM | POA: Diagnosis not present

## 2019-08-19 DIAGNOSIS — Z6841 Body Mass Index (BMI) 40.0 and over, adult: Secondary | ICD-10-CM | POA: Diagnosis not present

## 2019-08-19 DIAGNOSIS — D473 Essential (hemorrhagic) thrombocythemia: Secondary | ICD-10-CM | POA: Diagnosis not present

## 2019-08-19 DIAGNOSIS — G4733 Obstructive sleep apnea (adult) (pediatric): Secondary | ICD-10-CM | POA: Diagnosis not present

## 2019-08-19 DIAGNOSIS — J449 Chronic obstructive pulmonary disease, unspecified: Secondary | ICD-10-CM | POA: Diagnosis not present

## 2019-08-19 DIAGNOSIS — Z20822 Contact with and (suspected) exposure to covid-19: Secondary | ICD-10-CM | POA: Diagnosis not present

## 2019-08-20 ENCOUNTER — Ambulatory Visit: Payer: Medicare HMO | Admitting: Family Medicine

## 2019-08-23 DIAGNOSIS — R69 Illness, unspecified: Secondary | ICD-10-CM | POA: Diagnosis not present

## 2019-08-24 DIAGNOSIS — S8251XK Displaced fracture of medial malleolus of right tibia, subsequent encounter for closed fracture with nonunion: Secondary | ICD-10-CM | POA: Diagnosis not present

## 2019-08-24 DIAGNOSIS — M87861 Other osteonecrosis, right tibia: Secondary | ICD-10-CM | POA: Diagnosis not present

## 2019-08-24 DIAGNOSIS — M1712 Unilateral primary osteoarthritis, left knee: Secondary | ICD-10-CM | POA: Diagnosis not present

## 2019-08-24 DIAGNOSIS — M1711 Unilateral primary osteoarthritis, right knee: Secondary | ICD-10-CM | POA: Diagnosis not present

## 2019-08-25 ENCOUNTER — Telehealth (INDEPENDENT_AMBULATORY_CARE_PROVIDER_SITE_OTHER): Payer: Self-pay | Admitting: Internal Medicine

## 2019-08-25 NOTE — Telephone Encounter (Signed)
Patient called regarding lab results - please advise - [phone]

## 2019-08-27 ENCOUNTER — Other Ambulatory Visit (HOSPITAL_COMMUNITY): Payer: Self-pay | Admitting: *Deleted

## 2019-08-27 DIAGNOSIS — D509 Iron deficiency anemia, unspecified: Secondary | ICD-10-CM

## 2019-08-27 NOTE — Telephone Encounter (Signed)
I do not see any recent labs in our system. Review of Care everywhere shows a continued low potassium during admission at [facility] on 08/20/19- hopefully hospitalist addressed this as results would have returned to them. Please let patient know and can find out if he is taking K+ supplement

## 2019-08-27 NOTE — Telephone Encounter (Signed)
Patient called. He states that he is taking 2 40 mEQ twice a day , and a additional 40 mEq for a total of 5 per day. He also stated that his potassium was not addressed.when he was at [facility]. He was made aware that provider would be made aware any further recommendations we would call him back.

## 2019-08-28 ENCOUNTER — Other Ambulatory Visit: Payer: Self-pay | Admitting: Family Medicine

## 2019-08-28 ENCOUNTER — Other Ambulatory Visit: Payer: Self-pay

## 2019-08-28 ENCOUNTER — Ambulatory Visit (INDEPENDENT_AMBULATORY_CARE_PROVIDER_SITE_OTHER): Payer: Medicare HMO | Admitting: Family Medicine

## 2019-08-28 ENCOUNTER — Encounter: Payer: Self-pay | Admitting: Family Medicine

## 2019-08-28 VITALS — BP 103/67 | HR 76 | Temp 98.6°F | Ht 72.0 in | Wt 324.0 lb

## 2019-08-28 DIAGNOSIS — M25511 Pain in right shoulder: Secondary | ICD-10-CM | POA: Diagnosis not present

## 2019-08-28 DIAGNOSIS — R7881 Bacteremia: Secondary | ICD-10-CM

## 2019-08-28 DIAGNOSIS — G8929 Other chronic pain: Secondary | ICD-10-CM | POA: Diagnosis not present

## 2019-08-28 DIAGNOSIS — M109 Gout, unspecified: Secondary | ICD-10-CM | POA: Diagnosis not present

## 2019-08-28 DIAGNOSIS — G4733 Obstructive sleep apnea (adult) (pediatric): Secondary | ICD-10-CM | POA: Diagnosis not present

## 2019-08-28 DIAGNOSIS — E1165 Type 2 diabetes mellitus with hyperglycemia: Secondary | ICD-10-CM

## 2019-08-28 DIAGNOSIS — J449 Chronic obstructive pulmonary disease, unspecified: Secondary | ICD-10-CM | POA: Diagnosis not present

## 2019-08-28 MED ORDER — KETOROLAC TROMETHAMINE 60 MG/2ML IM SOLN: 60.0000 mg | Freq: Once | INTRAMUSCULAR | Status: AC

## 2019-08-28 MED ORDER — GABAPENTIN 800 MG PO TABS: 800.0000 mg | ORAL_TABLET | Freq: Three times a day (TID) | ORAL | 0 refills | Status: DC

## 2019-08-28 NOTE — Progress Notes (Signed)
BP 103/67   Pulse 76   Temp 98.6 F (37 C)   Ht 6' (1.829 m)   Wt (!) 324 lb (147 kg)   SpO2 96%   BMI 43.94 kg/m    Subjective:   Patient ID: [name], male    DOB: 07-09-1965, 54 y.o.   MRN: [id number]  HPI: [name] is a 54 y.o. male presenting on 08/28/2019 for Hospitalization Follow-up   HPI Right shoulder pain Patient is coming in for right shoulder pain that is been bothering him for quite some time, has had x-rays before but he wants to go see his orthopedic to possibly get an MRI.  He is seen Dr. Annie Main Case black before I would like to go back and see him.  Patient says overhead range of motion makes it worse and has been hurting for quite a few years.  Patient is also complaining of right knee pain is gout that he has chronically frequently and he is wondering if he can get anything to help with back to the injection of Toradol it seems to help.  Has improved since hospitalization but still causing issues.  She was in the hospital for sepsis and bacteremia of unknown source and fevers, he was admitted on 08/16/2019 and discharged on 08/21/2019.  He was treated with antibiotics and did not have any symptoms except a mild fever during that whole time.  He denies any symptoms after leaving, he denies neurologic symptoms or bowel symptoms or respiratory symptoms.  Patient denies any skin infections.  The main thing he has issues with his his joints from the chronic gout.  Relevant past medical, surgical, family and social history reviewed and updated as indicated. Interim medical history since our last visit reviewed. Allergies and medications reviewed and updated.  Review of Systems  Constitutional: Negative for chills and fever.  Respiratory: Negative for shortness of breath and wheezing.   Cardiovascular: Negative for chest pain and leg swelling.  Musculoskeletal: Positive for arthralgias and joint swelling. Negative for back pain and gait problem.  Skin:  Negative for rash.  All other systems reviewed and are negative.   Per HPI unless specifically indicated above   Allergies as of 08/28/2019      Reactions   Allopurinol Other (See Comments)   Joint pain worse    Oxycodone Base Itching   Prozac [fluoxetine Hcl] Itching      Medication List       Accurate as of August 28, 2019  4:01 PM. If you have any questions, ask your nurse or doctor.        aspirin EC 81 MG tablet Take 81 mg by mouth as needed (CHEST PAIN).   baclofen 10 MG tablet Commonly known as: LIORESAL Take 1 tablet (10 mg total) by mouth 3 (three) times daily.   betamethasone dipropionate 0.05 % cream APPLY TO AFFECTED AREA TWICE A DAY What changed:   how much to take  how to take this  when to take this  reasons to take this  additional instructions   buprenorphine 2 MG Subl SL tablet Commonly known as: SUBUTEX 2 mg. Place 2 tablets under tongue twice daily   buprenorphine-naloxone 8-2 mg Subl SL tablet Commonly known as: SUBOXONE Place 0.5 tablets under the tongue 2 (two) times daily.   busPIRone 10 MG tablet Commonly known as: BUSPAR TAKE 1 TABLET BY MOUTH THREE TIMES A DAY   colchicine 0.6 MG tablet Take 0.6 mg by mouth  2 (two) times daily as needed.   diltiazem 300 MG 24 hr capsule Commonly known as: Cartia XT TAKE ONE CAPSULE BY MOUTH EVERY DAY What changed:   how much to take  how to take this  when to take this   DULoxetine 60 MG capsule Commonly known as: CYMBALTA Take 60 mg by mouth at bedtime.   esomeprazole 40 MG capsule Commonly known as: NEXIUM Take 1 capsule (40 mg total) by mouth daily.   ezetimibe 10 MG tablet Commonly known as: ZETIA TAKE 1 TABLET BY MOUTH EVERY DAY   fluticasone 50 MCG/ACT nasal spray Commonly known as: FLONASE USE TWO SPRAYS IN EACH NOSTRIL EVERY DAY AS NEEDED   folic acid 1 MG tablet Commonly known as: FOLVITE TAKE ONE TABLET BY MOUTH ONCE DAILY - EMERGENCY REFILL FAXED DR. What  changed: See the new instructions.   gabapentin 800 MG tablet Commonly known as: NEURONTIN Take 1 tablet (800 mg total) by mouth 3 (three) times daily. (Needs to be seen before next refill)   HYDROmorphone 4 MG tablet Commonly known as: DILAUDID Take 4 mg by mouth every 4 (four) hours as needed for moderate pain or severe pain.   ipratropium 0.02 % nebulizer solution Commonly known as: ATROVENT Take 0.5 mg by nebulization every 6 (six) hours as needed for wheezing or shortness of breath.   lactulose 10 GM/15ML solution Commonly known as: CHRONULAC TAKE 2 TABLESPOONS BY MOUTH TWICE DAILY   lidocaine 5 % ointment Commonly known as: XYLOCAINE Apply 1 application topically as needed.   Magnesium Oxide -Mg Supplement 400 MG Caps Take 400 mg by mouth daily.   metolazone 2.5 MG tablet Commonly known as: ZAROXOLYN Take 1 tablet (2.5 mg total) by mouth 3 (three) times a week.   metoprolol tartrate 25 MG tablet Commonly known as: LOPRESSOR Take 1 tablet (25 mg total) by mouth 2 (two) times daily.   MULTI COMPLETE PO Take 1 tablet by mouth daily.   Muscle Rub 10-15 % Crea Apply 1 application topically 3 (three) times daily as needed for muscle pain. For muscle pain   neomycin-bacitracin-polymyxin ointment Commonly known as: NEOSPORIN Apply 1 application topically every 12 (twelve) hours as needed for wound care. Reported on 9/32/3557   OneTouch Delica Lancets 32K Misc Test BS QID Dx E11.9   potassium chloride SA 20 MEQ tablet Commonly known as: KLOR-CON Take 2 tablets (40 mEq total) by mouth 2 (two) times daily.   pravastatin 10 MG tablet Commonly known as: PRAVACHOL Take 1 tablet (10 mg total) by mouth daily.   QUEtiapine 100 MG tablet Commonly known as: SEROquel Take 1 tablet (100 mg total) by mouth at bedtime.   torsemide 20 MG tablet Commonly known as: DEMADEX TAKE 4 TABLETS BY MOUTH TWICE A DAY   Voltaren 1 % Gel Generic drug: diclofenac Sodium Apply 4 g  topically 4 (four) times daily.        Objective:   BP 103/67   Pulse 76   Temp 98.6 F (37 C)   Ht 6' (1.829 m)   Wt (!) 324 lb (147 kg)   SpO2 96%   BMI 43.94 kg/m   Wt Readings from Last 3 Encounters:  08/28/19 (!) 324 lb (147 kg)  07/17/19 (!) 342 lb 3.2 oz (155.2 kg)  07/15/19 (!) 349 lb (158.3 kg)    Physical Exam Vitals and nursing note reviewed.  Constitutional:      General: He is not in acute distress.  Appearance: He is well-developed. He is not diaphoretic.  Eyes:     General: No scleral icterus.    Conjunctiva/sclera: Conjunctivae normal.  Neck:     Thyroid: No thyromegaly.  Cardiovascular:     Rate and Rhythm: Normal rate and regular rhythm.     Heart sounds: Normal heart sounds. No murmur heard.   Pulmonary:     Effort: Pulmonary effort is normal. No respiratory distress.     Breath sounds: Normal breath sounds. No wheezing.  Musculoskeletal:        General: Tenderness (Right shoulder pain and right knee pain with range of motion.) present.     Cervical back: Neck supple.  Lymphadenopathy:     Cervical: No cervical adenopathy.  Skin:    General: Skin is warm and dry.     Findings: No rash.  Neurological:     Mental Status: He is alert and oriented to person, place, and time.     Coordination: Coordination normal.  Psychiatric:        Behavior: Behavior normal.       Assessment & Plan:   Problem List Items Addressed This Visit      Other   Gout - Primary   Relevant Medications   ketorolac (TORADOL) injection 60 mg   Other Relevant Orders   CBC with Differential/Platelet   CMP14+EGFR    Other Visit Diagnoses    Bacteremia       Relevant Orders   CBC with Differential/Platelet   CMP14+EGFR   Chronic right shoulder pain       Relevant Medications   ketorolac (TORADOL) injection 60 mg   Other Relevant Orders   Ambulatory referral to Orthopedic Surgery      No fever symptoms, still on abx.   Give toradol for gout Follow up  plan: Return if symptoms worsen or fail to improve.  Counseling provided for all of the vaccine components No orders of the defined types were placed in this encounter.   Caryl Pina, MD [facility] 08/28/2019, 4:01 PM

## 2019-08-28 NOTE — Telephone Encounter (Signed)
Call patient and make appointment for 1 week with [name] to discuss and review his care , this is per [name]. Ask Patient to bring in all his medications at that time.

## 2019-08-28 NOTE — Addendum Note (Signed)
Addended by: Caryl Pina on: 08/28/2019 05:48 PM   Modules accepted: Orders

## 2019-08-28 NOTE — Telephone Encounter (Signed)
Would recommend OV and labs within next week or so to review recommendations. Needs to bring all medications to appointment please. Thanks.

## 2019-08-29 LAB — CMP14+EGFR
ALT: 15 IU/L (ref 0–44)
AST: 27 IU/L (ref 0–40)
Albumin/Globulin Ratio: 1.9 (ref 1.2–2.2)
Albumin: 4.6 g/dL (ref 3.8–4.9)
Alkaline Phosphatase: 52 IU/L (ref 48–121)
BUN/Creatinine Ratio: 21 — ABNORMAL HIGH (ref 9–20)
BUN: 22 mg/dL (ref 6–24)
Bilirubin Total: 0.3 mg/dL (ref 0.0–1.2)
CO2: 27 mmol/L (ref 20–29)
Calcium: 10 mg/dL (ref 8.7–10.2)
Chloride: 100 mmol/L (ref 96–106)
Creatinine, Ser: 1.06 mg/dL (ref 0.76–1.27)
GFR calc Af Amer: 91 mL/min/{1.73_m2} (ref >59)
GFR calc non Af Amer: 79 mL/min/{1.73_m2} (ref >59)
Globulin, Total: 2.4 g/dL (ref 1.5–4.5)
Glucose: 100 mg/dL — ABNORMAL HIGH (ref 65–99)
Potassium: 4.1 mmol/L (ref 3.5–5.2)
Sodium: 140 mmol/L (ref 134–144)
Total Protein: 7 g/dL (ref 6.0–8.5)

## 2019-08-29 LAB — CBC WITH DIFFERENTIAL/PLATELET
Basophils Absolute: 0.1 10*3/uL (ref 0.0–0.2)
Basos: 2 %
EOS (ABSOLUTE): 0.1 10*3/uL (ref 0.0–0.4)
Eos: 4 %
Hematocrit: 35.8 % — ABNORMAL LOW (ref 37.5–51.0)
Hemoglobin: 12.3 g/dL — ABNORMAL LOW (ref 13.0–17.7)
Immature Grans (Abs): 0 10*3/uL (ref 0.0–0.1)
Immature Granulocytes: 0 %
Lymphocytes Absolute: 0.6 10*3/uL — ABNORMAL LOW (ref 0.7–3.1)
Lymphs: 16 %
MCH: 28.2 pg (ref 26.6–33.0)
MCHC: 34.4 g/dL (ref 31.5–35.7)
MCV: 82 fL (ref 79–97)
Monocytes Absolute: 0.3 10*3/uL (ref 0.1–0.9)
Monocytes: 9 %
Neutrophils Absolute: 2.5 10*3/uL (ref 1.4–7.0)
Neutrophils: 69 %
Platelets: 127 10*3/uL — ABNORMAL LOW (ref 150–450)
RBC: 4.36 x10E6/uL (ref 4.14–5.80)
RDW: 15.8 % — ABNORMAL HIGH (ref 11.6–15.4)
WBC: 3.7 10*3/uL (ref 3.4–10.8)

## 2019-08-31 ENCOUNTER — Other Ambulatory Visit (HOSPITAL_COMMUNITY): Payer: Self-pay | Admitting: *Deleted

## 2019-08-31 ENCOUNTER — Telehealth (HOSPITAL_COMMUNITY): Payer: Self-pay | Admitting: *Deleted

## 2019-08-31 DIAGNOSIS — D509 Iron deficiency anemia, unspecified: Secondary | ICD-10-CM

## 2019-08-31 NOTE — Telephone Encounter (Signed)
Orders placed for vitamin D to be drawn this week at his lab appointment.

## 2019-09-03 ENCOUNTER — Inpatient Hospital Stay (HOSPITAL_COMMUNITY): Payer: Medicare HMO | Attending: Hematology

## 2019-09-05 ENCOUNTER — Other Ambulatory Visit: Payer: Self-pay | Admitting: Family Medicine

## 2019-09-05 DIAGNOSIS — F410 Panic disorder [episodic paroxysmal anxiety] without agoraphobia: Secondary | ICD-10-CM

## 2019-09-05 DIAGNOSIS — E1165 Type 2 diabetes mellitus with hyperglycemia: Secondary | ICD-10-CM

## 2019-09-05 DIAGNOSIS — Z9189 Other specified personal risk factors, not elsewhere classified: Secondary | ICD-10-CM

## 2019-09-07 ENCOUNTER — Ambulatory Visit (INDEPENDENT_AMBULATORY_CARE_PROVIDER_SITE_OTHER): Payer: Medicare HMO | Admitting: Gastroenterology

## 2019-09-07 DIAGNOSIS — J449 Chronic obstructive pulmonary disease, unspecified: Secondary | ICD-10-CM | POA: Diagnosis not present

## 2019-09-07 DIAGNOSIS — E119 Type 2 diabetes mellitus without complications: Secondary | ICD-10-CM | POA: Diagnosis not present

## 2019-09-07 DIAGNOSIS — M109 Gout, unspecified: Secondary | ICD-10-CM | POA: Diagnosis not present

## 2019-09-07 DIAGNOSIS — M1990 Unspecified osteoarthritis, unspecified site: Secondary | ICD-10-CM | POA: Diagnosis not present

## 2019-09-09 DIAGNOSIS — G5712 Meralgia paresthetica, left lower limb: Secondary | ICD-10-CM | POA: Diagnosis not present

## 2019-09-09 DIAGNOSIS — Z79891 Long term (current) use of opiate analgesic: Secondary | ICD-10-CM | POA: Diagnosis not present

## 2019-09-09 DIAGNOSIS — M130 Polyarthritis, unspecified: Secondary | ICD-10-CM | POA: Diagnosis not present

## 2019-09-09 DIAGNOSIS — R69 Illness, unspecified: Secondary | ICD-10-CM | POA: Diagnosis not present

## 2019-09-09 DIAGNOSIS — G894 Chronic pain syndrome: Secondary | ICD-10-CM | POA: Diagnosis not present

## 2019-09-09 DIAGNOSIS — G4733 Obstructive sleep apnea (adult) (pediatric): Secondary | ICD-10-CM | POA: Diagnosis not present

## 2019-09-09 DIAGNOSIS — F1410 Cocaine abuse, uncomplicated: Secondary | ICD-10-CM | POA: Insufficient documentation

## 2019-09-10 ENCOUNTER — Encounter: Payer: Self-pay | Admitting: Family Medicine

## 2019-09-10 ENCOUNTER — Ambulatory Visit: Payer: Medicare HMO | Admitting: Family Medicine

## 2019-09-23 DIAGNOSIS — M87861 Other osteonecrosis, right tibia: Secondary | ICD-10-CM | POA: Diagnosis not present

## 2019-09-23 DIAGNOSIS — S8251XK Displaced fracture of medial malleolus of right tibia, subsequent encounter for closed fracture with nonunion: Secondary | ICD-10-CM | POA: Diagnosis not present

## 2019-09-28 DIAGNOSIS — J449 Chronic obstructive pulmonary disease, unspecified: Secondary | ICD-10-CM | POA: Diagnosis not present

## 2019-09-28 DIAGNOSIS — G4733 Obstructive sleep apnea (adult) (pediatric): Secondary | ICD-10-CM | POA: Diagnosis not present

## 2019-09-30 ENCOUNTER — Other Ambulatory Visit: Payer: Self-pay | Admitting: Family Medicine

## 2019-09-30 DIAGNOSIS — M1A9XX0 Chronic gout, unspecified, without tophus (tophi): Secondary | ICD-10-CM

## 2019-10-01 ENCOUNTER — Telehealth (INDEPENDENT_AMBULATORY_CARE_PROVIDER_SITE_OTHER): Payer: Self-pay | Admitting: Gastroenterology

## 2019-10-01 NOTE — Telephone Encounter (Signed)
Patient left message wanted to know if he is due for lab work - please advise - ph# [phone]

## 2019-10-01 NOTE — Telephone Encounter (Signed)
He last had labs in July 2021 - need his 6 month liver labs due in Jan 2022. Thanks.

## 2019-10-01 NOTE — Telephone Encounter (Signed)
Routing to [employer] to advise?

## 2019-10-02 NOTE — Telephone Encounter (Signed)
I tried to call Mr Biddinger back and I got no answer nor machine

## 2019-10-06 ENCOUNTER — Other Ambulatory Visit: Payer: Self-pay | Admitting: Family Medicine

## 2019-10-07 ENCOUNTER — Other Ambulatory Visit: Payer: Self-pay | Admitting: Family Medicine

## 2019-10-07 DIAGNOSIS — M1A9XX0 Chronic gout, unspecified, without tophus (tophi): Secondary | ICD-10-CM

## 2019-10-07 DIAGNOSIS — G5712 Meralgia paresthetica, left lower limb: Secondary | ICD-10-CM | POA: Diagnosis not present

## 2019-10-07 DIAGNOSIS — M130 Polyarthritis, unspecified: Secondary | ICD-10-CM | POA: Diagnosis not present

## 2019-10-07 DIAGNOSIS — R69 Illness, unspecified: Secondary | ICD-10-CM | POA: Diagnosis not present

## 2019-10-07 DIAGNOSIS — G894 Chronic pain syndrome: Secondary | ICD-10-CM | POA: Diagnosis not present

## 2019-10-07 DIAGNOSIS — G4733 Obstructive sleep apnea (adult) (pediatric): Secondary | ICD-10-CM | POA: Diagnosis not present

## 2019-10-07 DIAGNOSIS — M109 Gout, unspecified: Secondary | ICD-10-CM | POA: Diagnosis not present

## 2019-10-07 DIAGNOSIS — Z79891 Long term (current) use of opiate analgesic: Secondary | ICD-10-CM | POA: Diagnosis not present

## 2019-10-07 DIAGNOSIS — Z9189 Other specified personal risk factors, not elsewhere classified: Secondary | ICD-10-CM

## 2019-10-07 DIAGNOSIS — E1165 Type 2 diabetes mellitus with hyperglycemia: Secondary | ICD-10-CM

## 2019-10-08 ENCOUNTER — Other Ambulatory Visit: Payer: Self-pay | Admitting: Family Medicine

## 2019-10-10 DIAGNOSIS — R69 Illness, unspecified: Secondary | ICD-10-CM | POA: Diagnosis not present

## 2019-10-11 ENCOUNTER — Other Ambulatory Visit: Payer: Self-pay | Admitting: Family Medicine

## 2019-10-11 DIAGNOSIS — E1165 Type 2 diabetes mellitus with hyperglycemia: Secondary | ICD-10-CM

## 2019-10-12 DIAGNOSIS — M25511 Pain in right shoulder: Secondary | ICD-10-CM | POA: Insufficient documentation

## 2019-10-12 DIAGNOSIS — M19039 Primary osteoarthritis, unspecified wrist: Secondary | ICD-10-CM | POA: Diagnosis not present

## 2019-10-12 DIAGNOSIS — M75121 Complete rotator cuff tear or rupture of right shoulder, not specified as traumatic: Secondary | ICD-10-CM | POA: Diagnosis not present

## 2019-10-12 DIAGNOSIS — M931 Kienbock's disease of adults: Secondary | ICD-10-CM | POA: Insufficient documentation

## 2019-10-12 DIAGNOSIS — G8911 Acute pain due to trauma: Secondary | ICD-10-CM | POA: Diagnosis not present

## 2019-10-14 ENCOUNTER — Encounter (HOSPITAL_COMMUNITY): Payer: Self-pay | Admitting: *Deleted

## 2019-10-14 ENCOUNTER — Emergency Department (HOSPITAL_COMMUNITY)
Admission: EM | Admit: 2019-10-14 | Discharge: 2019-10-15 | Disposition: A | Discharge status: Home / Self Care | Payer: Medicare HMO | Attending: Emergency Medicine | Admitting: Emergency Medicine

## 2019-10-14 ENCOUNTER — Other Ambulatory Visit: Payer: Self-pay

## 2019-10-14 ENCOUNTER — Emergency Department (HOSPITAL_COMMUNITY): Payer: Medicare HMO

## 2019-10-14 ENCOUNTER — Telehealth: Payer: Self-pay | Admitting: Cardiology

## 2019-10-14 DIAGNOSIS — E1169 Type 2 diabetes mellitus with other specified complication: Secondary | ICD-10-CM | POA: Insufficient documentation

## 2019-10-14 DIAGNOSIS — I5033 Acute on chronic diastolic (congestive) heart failure: Secondary | ICD-10-CM | POA: Insufficient documentation

## 2019-10-14 DIAGNOSIS — J449 Chronic obstructive pulmonary disease, unspecified: Secondary | ICD-10-CM | POA: Diagnosis not present

## 2019-10-14 DIAGNOSIS — I959 Hypotension, unspecified: Secondary | ICD-10-CM | POA: Diagnosis not present

## 2019-10-14 DIAGNOSIS — I110 Hypertensive heart disease with heart failure: Secondary | ICD-10-CM | POA: Insufficient documentation

## 2019-10-14 DIAGNOSIS — I4891 Unspecified atrial fibrillation: Secondary | ICD-10-CM | POA: Diagnosis not present

## 2019-10-14 DIAGNOSIS — Z79899 Other long term (current) drug therapy: Secondary | ICD-10-CM | POA: Diagnosis not present

## 2019-10-14 DIAGNOSIS — R0602 Shortness of breath: Secondary | ICD-10-CM | POA: Insufficient documentation

## 2019-10-14 DIAGNOSIS — R069 Unspecified abnormalities of breathing: Secondary | ICD-10-CM | POA: Diagnosis not present

## 2019-10-14 DIAGNOSIS — R0689 Other abnormalities of breathing: Secondary | ICD-10-CM | POA: Diagnosis not present

## 2019-10-14 DIAGNOSIS — Z20822 Contact with and (suspected) exposure to covid-19: Secondary | ICD-10-CM | POA: Insufficient documentation

## 2019-10-14 DIAGNOSIS — E782 Mixed hyperlipidemia: Secondary | ICD-10-CM | POA: Insufficient documentation

## 2019-10-14 DIAGNOSIS — Z7982 Long term (current) use of aspirin: Secondary | ICD-10-CM | POA: Insufficient documentation

## 2019-10-14 DIAGNOSIS — E876 Hypokalemia: Secondary | ICD-10-CM

## 2019-10-14 LAB — RESPIRATORY PANEL BY RT PCR (FLU A&B, COVID)
Influenza A by PCR: NEGATIVE
Influenza B by PCR: NEGATIVE
SARS Coronavirus 2 by RT PCR: NEGATIVE

## 2019-10-14 LAB — BASIC METABOLIC PANEL
Anion gap: 16 — ABNORMAL HIGH (ref 5–15)
BUN: 11 mg/dL (ref 6–20)
CO2: 29 mmol/L (ref 22–32)
Calcium: 9.9 mg/dL (ref 8.9–10.3)
Chloride: 91 mmol/L — ABNORMAL LOW (ref 98–111)
Creatinine, Ser: 0.87 mg/dL (ref 0.61–1.24)
GFR calc non Af Amer: >60 mL/min (ref >60)
Glucose, Bld: 182 mg/dL — ABNORMAL HIGH (ref 70–99)
Potassium: 2.6 mmol/L — CL (ref 3.5–5.1)
Sodium: 136 mmol/L (ref 135–145)

## 2019-10-14 LAB — URINALYSIS, ROUTINE W REFLEX MICROSCOPIC
Bilirubin Urine: NEGATIVE
Glucose, UA: NEGATIVE mg/dL
Hgb urine dipstick: NEGATIVE
Ketones, ur: NEGATIVE mg/dL
Leukocytes,Ua: NEGATIVE
Nitrite: NEGATIVE
Protein, ur: NEGATIVE mg/dL
Specific Gravity, Urine: 1.015 (ref 1.005–1.030)
pH: 7 (ref 5.0–8.0)

## 2019-10-14 LAB — HEPATIC FUNCTION PANEL
ALT: 23 U/L (ref 0–44)
AST: 52 U/L — ABNORMAL HIGH (ref 15–41)
Albumin: 4 g/dL (ref 3.5–5.0)
Alkaline Phosphatase: 89 U/L (ref 38–126)
Bilirubin, Direct: 0.7 mg/dL — ABNORMAL HIGH (ref 0.0–0.2)
Indirect Bilirubin: 2.3 mg/dL — ABNORMAL HIGH (ref 0.3–0.9)
Total Bilirubin: 3 mg/dL — ABNORMAL HIGH (ref 0.3–1.2)
Total Protein: 8.3 g/dL — ABNORMAL HIGH (ref 6.5–8.1)

## 2019-10-14 LAB — ETHANOL: Alcohol, Ethyl (B): <10 mg/dL (ref <10)

## 2019-10-14 LAB — CBC
HCT: 43.5 % (ref 39.0–52.0)
Hemoglobin: 14.9 g/dL (ref 13.0–17.0)
MCH: 29.3 pg (ref 26.0–34.0)
MCHC: 34.3 g/dL (ref 30.0–36.0)
MCV: 85.5 fL (ref 80.0–100.0)
Platelets: 74 10*3/uL — ABNORMAL LOW (ref 150–400)
RBC: 5.09 MIL/uL (ref 4.22–5.81)
RDW: 14.9 % (ref 11.5–15.5)
WBC: 7.5 10*3/uL (ref 4.0–10.5)
nRBC: 0 % (ref 0.0–0.2)

## 2019-10-14 LAB — RAPID URINE DRUG SCREEN, HOSP PERFORMED
Amphetamines: NOT DETECTED
Barbiturates: NOT DETECTED
Benzodiazepines: NOT DETECTED
Cocaine: NOT DETECTED
Opiates: NOT DETECTED
Tetrahydrocannabinol: POSITIVE — AB

## 2019-10-14 LAB — PROTIME-INR
INR: 1.2 (ref 0.8–1.2)
Prothrombin Time: 14.4 seconds (ref 11.4–15.2)

## 2019-10-14 LAB — LIPASE, BLOOD: Lipase: 34 U/L (ref 11–51)

## 2019-10-14 LAB — BRAIN NATRIURETIC PEPTIDE: B Natriuretic Peptide: 106 pg/mL — ABNORMAL HIGH (ref 0.0–100.0)

## 2019-10-14 MED ORDER — POTASSIUM CHLORIDE 10 MEQ/100ML IV SOLN
10.0000 meq | Freq: Once | INTRAVENOUS | Status: AC
  Administered 2019-10-14: 10 meq via INTRAVENOUS
  Filled 2019-10-14: qty 100

## 2019-10-14 MED ORDER — DILTIAZEM HCL ER 90 MG PO CP12: 90.0000 mg | ORAL_CAPSULE | Freq: Every day | ORAL | 3 refills | Status: DC | PRN

## 2019-10-14 MED ORDER — POTASSIUM CHLORIDE CRYS ER 20 MEQ PO TBCR
40.0000 meq | EXTENDED_RELEASE_TABLET | Freq: Once | ORAL | Status: AC
  Administered 2019-10-14: 40 meq via ORAL
  Filled 2019-10-14: qty 2

## 2019-10-14 MED ORDER — POTASSIUM CHLORIDE CRYS ER 20 MEQ PO TBCR: 40.0000 meq | EXTENDED_RELEASE_TABLET | Freq: Two times a day (BID) | ORAL | 1 refills | Status: DC

## 2019-10-14 MED ORDER — LORAZEPAM 2 MG/ML IJ SOLN
1.0000 mg | Freq: Once | INTRAMUSCULAR | Status: AC
  Administered 2019-10-14: 1 mg via INTRAVENOUS
  Filled 2019-10-14: qty 1

## 2019-10-14 MED ORDER — SODIUM CHLORIDE 0.9 % IV SOLN
INTRAVENOUS | Status: DC
  Administered 2019-10-14: 1000 mL via INTRAVENOUS

## 2019-10-14 NOTE — Telephone Encounter (Signed)
I agree with the suggestion to go to the ED with any acute distress such as SOB.   Also, agree that he needs to get back on his previous meds for BP control.

## 2019-10-14 NOTE — Telephone Encounter (Signed)
Patient calling back with more questions.

## 2019-10-14 NOTE — Telephone Encounter (Signed)
Pt has concluded will go call EMS and go to ED for evaluation ./cy

## 2019-10-14 NOTE — ED Triage Notes (Addendum)
Pt brought in by [facility] with c/o SOB, nervousness x 2.5 days. O2 sat 98% on RA for EMS. A. Fib on the monitor per EMS.

## 2019-10-14 NOTE — Discharge Instructions (Signed)
Make an appointment to follow-up with your primary care doctor in the next few days.  Start taking 40 mEq of potassium twice a day.  Prescription provided.  Not clear whether this will be adequate that is the reason for the follow-up.  But you also received a total of 30 mEq of potassium here tonight IV and additional 80 mg of potassium p.o.  So that should help replenish your potassium in the short run.

## 2019-10-14 NOTE — ED Provider Notes (Addendum)
[facility] EMERGENCY DEPARTMENT Provider Note   CSN: [id number] Arrival date & time: 10/14/19  1647     History Chief Complaint  Patient presents with  . Shortness of Breath    [name] is a 54 y.o. male.  Patient presenting with shortness of breath.  No chest pain.  Patient with past history of substance abuse also history of cervical process.  States he last had some alcohol on Monday afternoon.  Longstanding history of atrial fibrillation.  But not on blood thinners.  Patient's respiratory rate on presentation was 16 oxygen saturations 98%.  Not febrile heart rate 70 but irregular.  Cardiac monitoring consistent with atrial fib.  Patient's labs during the time he was waiting came back with a significantly low potassium of 2.6.  Patient has a history of chronic potassium deficit and is supposed to be on oral potassium.  Patient tells me that he is not taking it currently.  Most likely potassium is low due to diuretics.  Patient also has a history of chronic gout problems.  Has some bone destruction due to that on his left wrist area and his right foot.  Both have kind of splints for boot in place.  This is being followed by orthopedics.  Patient stating that he is feeling a little bit anxious.  Not clear whether that is part of the reason for the shortness of breath.        Past Medical History:  Diagnosis Date  . Anxiety   . Atrial fibrillation ([place])   . CHF (congestive heart failure) ([place])   . Cirrhosis ([place])    NASH  . Constipation   . COPD (chronic obstructive pulmonary disease) ([place])   . Depression, recurrent ([place]) 07/18/2016  . GERD (gastroesophageal reflux disease)   . Gout   . Leukopenia 07/08/2015  . Neuromuscular disorder (HCC)    neuropathy in hands and feet  . Psoriasis   . RA (rheumatoid arthritis) ([place])   . Sleep apnea    cpap used- level 10 and greater  . Thrombocytopenia [facility])     Patient Active Problem List   Diagnosis Date Noted  .  Dyslipidemia 07/14/2019  . Iron deficiency anemia 07/08/2019  . Type 2 diabetes mellitus without complication, without long-term current use of insulin ([place]) 06/01/2019  . Acute on chronic diastolic CHF (congestive heart failure) ([place]) 05/14/2019  . Hypokalemia 05/14/2019  . Scrotal edema 04/22/2019  . Bilateral hydrocele 04/22/2019  . Acute on chronic diastolic heart failure ([place])   . Elevated AST (SGOT)   . Pancytopenia ([place])   . Bilateral primary osteoarthritis of knee 04/04/2017  . Depression, recurrent ([place]) 07/18/2016  . Mixed hyperlipidemia 10/31/2015  . Leukopenia 07/08/2015  . Thrombocytopenia ([place]) 07/08/2015  . Portal vein thrombosis 06/08/2015  . Genu varus   . Hypogonadism male 05/05/2012  . Umbilical hernia, reducible 12/26/2011  . Asthma 08/28/2011  . Cirrhosis ([place]) 08/14/2011  . UNSPECIFIED TACHYCARDIA 02/01/2010  . History of diabetes mellitus 05/22/2008  . Gout 05/22/2008  . Hypertension associated with diabetes ([place]) 05/22/2008  . Atrial fibrillation ([place]) 05/22/2008  . DIASTOLIC HEART FAILURE, CHRONIC 05/22/2008  . OBESITY, MORBID 02/21/2007  . Coronary atherosclerosis 02/21/2007  . OSA (obstructive sleep apnea) 02/04/2007    Past Surgical History:  Procedure Laterality Date  . ANKLE SURGERY     right ankle talor repair  . CHOLECYSTECTOMY  sept 2016  . CHOLECYSTECTOMY    . COLONOSCOPY  05/08/2011   Procedure: COLONOSCOPY;  Surgeon: Bernadene Person  Gloriann Loan, MD;  Location: [facility] ENDO SUITE;  Service: Endoscopy;  Laterality: N/A;  730  . ESOPHAGOGASTRODUODENOSCOPY  02/28/2011   Procedure: ESOPHAGOGASTRODUODENOSCOPY (EGD);  Surgeon: Rogene Houston, MD;  Location: [facility] ENDO SUITE;  Service: Endoscopy;  Laterality: N/A;  1200  . ESOPHAGOGASTRODUODENOSCOPY N/A 06/11/2012   Procedure: ESOPHAGOGASTRODUODENOSCOPY (EGD);  Surgeon: Rogene Houston, MD;  Location: [facility] ENDO SUITE;  Service: Endoscopy;  Laterality: N/A;  1200  FYI patient is 400 pounds  . ESOPHAGOGASTRODUODENOSCOPY  (EGD) WITH PROPOFOL N/A 04/21/2014   Procedure: ESOPHAGOGASTRODUODENOSCOPY (EGD) WITH PROPOFOL;  Surgeon: Rogene Houston, MD;  Location: [facility] ORS;  Service: Endoscopy;  Laterality: N/A;  . HERNIA REPAIR Right    as child; inguinal  . HERNIA REPAIR  sept 2016  . LIVER BIOPSY  2012  . SCIATIC NERVE EXPLORATION    . TONSILLECTOMY         Family History  Problem Relation Age of Onset  . Colon cancer Mother   . Cancer Mother        intestine  . Cancer Father        throat  . Cancer Brother        brain  . Cancer Sister   . Heart attack Neg Hx   . Stroke Neg Hx     Social History   Tobacco Use  . Smoking status: Never Smoker  . Smokeless tobacco: Current User    Types: Snuff, Chew  . Tobacco comment: Pt reports that he "dips"  Vaping Use  . Vaping Use: Never used  Substance Use Topics  . Alcohol use: No    Alcohol/week: 1.0 standard drink    Types: 1 Cans of beer per week    Comment: stopped drinking 2-3 yrs ago.   . Drug use: No    Comment: Use to smoke cocaine and marijuana. No IV drug use    Home Medications Prior to Admission medications   Medication Sig Start Date End Date Taking? Authorizing Provider  aspirin EC 81 MG tablet Take 81 mg by mouth as needed (CHEST PAIN).    Yes [provider]  baclofen (LIORESAL) 10 MG tablet Take 1 tablet (10 mg total) by mouth 3 (three) times daily. 05/29/19  Yes Dettinger, Fransisca Kaufmann, MD  betamethasone dipropionate 0.05 % cream APPLY TO AFFECTED AREA TWICE A DAY Patient taking differently: Apply 1 application topically daily as needed (itching).  01/21/19  Yes Dettinger, Fransisca Kaufmann, MD  buprenorphine (SUBUTEX) 2 MG SUBL SL tablet 2 mg. Place 2 tablets under tongue twice daily 04/19/19  Yes [provider]  buprenorphine-naloxone (SUBOXONE) 8-2 mg SUBL SL tablet Place 0.5 tablets under the tongue 2 (two) times daily. 07/16/19  Yes [provider]  busPIRone (BUSPAR) 10 MG tablet TAKE 1 TABLET BY MOUTH THREE TIMES A  DAY 09/07/19  Yes Dettinger, Fransisca Kaufmann, MD  colchicine 0.6 MG tablet Take 0.6 mg by mouth 2 (two) times daily as needed.    Yes [provider]  diclofenac Sodium (VOLTAREN) 1 % GEL Apply 4 g topically 4 (four) times daily.   Yes [provider]  diltiazem (CARDIZEM SR) 90 MG 12 hr capsule Take 1 capsule (90 mg total) by mouth daily as needed. palpitations 10/14/19  Yes Minus Breeding, MD  diltiazem (CARTIA XT) 300 MG 24 hr capsule TAKE ONE CAPSULE BY MOUTH EVERY DAY Patient taking differently: Take 300 mg by mouth daily. TAKE ONE CAPSULE BY MOUTH EVERY DAY 03/02/19  Yes Dettinger, Fransisca Kaufmann,  MD  DULoxetine (CYMBALTA) 60 MG capsule Take 60 mg by mouth at bedtime.  08/27/18  Yes [provider]  esomeprazole (NEXIUM) 40 MG capsule Take 1 capsule (40 mg total) by mouth daily. 03/04/19  Yes Dettinger, Fransisca Kaufmann, MD  ezetimibe (ZETIA) 10 MG tablet TAKE 1 TABLET BY MOUTH EVERY DAY Patient taking differently: Take 10 mg by mouth daily.  02/20/18  Yes Dettinger, Fransisca Kaufmann, MD  fluticasone (FLONASE) 50 MCG/ACT nasal spray USE TWO SPRAYS IN EACH NOSTRIL EVERY DAY AS NEEDED 12/22/18  Yes Dettinger, Fransisca Kaufmann, MD  folic acid (FOLVITE) 1 MG tablet TAKE ONE TABLET BY MOUTH ONCE DAILY - EMERGENCY REFILL FAXED DR. Patient taking differently: Take 1 mg by mouth daily.  01/14/17  Yes Setzer, Terri L, NP  gabapentin (NEURONTIN) 800 MG tablet Take 1 tablet (800 mg total) by mouth 3 (three) times daily. 10/12/19  Yes Dettinger, Fransisca Kaufmann, MD  HYDROmorphone (DILAUDID) 4 MG tablet Take 4 mg by mouth every 4 (four) hours as needed for moderate pain or severe pain.  07/14/18  Yes [provider]  indomethacin (INDOCIN) 50 MG capsule Take 50 mg by mouth 3 (three) times daily as needed.   Yes [provider]  ipratropium (ATROVENT) 0.02 % nebulizer solution Take 0.5 mg by nebulization every 6 (six) hours as needed for wheezing or shortness of breath.    Yes [provider]  lactulose  (CHRONULAC) 10 GM/15ML solution TAKE 2 TABLESPOONS BY MOUTH TWICE DAILY 06/01/19  Yes Dettinger, Fransisca Kaufmann, MD  lidocaine (XYLOCAINE) 5 % ointment Apply 1 application topically as needed. 07/06/19  Yes Dettinger, Fransisca Kaufmann, MD  Magnesium Oxide -Mg Supplement 400 MG CAPS Take 400 mg by mouth daily. 08/04/19  Yes Minus Liberty, PA-C  Menthol-Methyl Salicylate (MUSCLE RUB) 10-15 % CREA Apply 1 application topically 3 (three) times daily as needed for muscle pain. For muscle pain    Yes [provider]  metolazone (ZAROXOLYN) 2.5 MG tablet Take 1 tablet (2.5 mg total) by mouth 3 (three) times a week. 05/20/19  Yes Hendricks Limes F, FNP  metoprolol tartrate (LOPRESSOR) 25 MG tablet TAKE 1 TABLET BY MOUTH TWICE A DAY 10/08/19  Yes Dettinger, Fransisca Kaufmann, MD  Multiple Vitamins-Minerals (MULTI COMPLETE PO) Take 1 tablet by mouth daily.    Yes [provider]  neomycin-bacitracin-polymyxin (NEOSPORIN) ointment Apply 1 application topically every 12 (twelve) hours as needed for wound care. Reported on 06/01/2015   Yes [provider]  potassium chloride SA (KLOR-CON) 20 MEQ tablet Take 2 tablets (40 mEq total) by mouth 2 (two) times daily. 07/30/19 10/14/19 Yes Laurine Blazer A, PA-C  pravastatin (PRAVACHOL) 10 MG tablet TAKE 1 TABLET BY MOUTH EVERY DAY 10/07/19  Yes Dettinger, Fransisca Kaufmann, MD  QUEtiapine (SEROQUEL) 100 MG tablet Take 1 tablet (100 mg total) by mouth at bedtime. 03/04/19  Yes Dettinger, Fransisca Kaufmann, MD  torsemide (DEMADEX) 20 MG tablet TAKE 4 TABLETS BY MOUTH TWICE A DAY Patient taking differently: Take 80 mg by mouth 2 (two) times daily.  10/06/19  Yes Dettinger, Fransisca Kaufmann, MD  OneTouch Delica Lancets 81W MISC Test BS QID Dx E11.9 05/27/19   Dettinger, Fransisca Kaufmann, MD  potassium chloride SA (KLOR-CON) 20 MEQ tablet Take 2 tablets (40 mEq total) by mouth 2 (two) times daily. 10/14/19   Fredia Sorrow, MD    Allergies    Allopurinol  Review of Systems   Review of Systems   Constitutional: Negative for chills and fever.  HENT: Negative for congestion, rhinorrhea and sore throat.   Eyes: Negative for visual disturbance.  Respiratory: Positive for shortness of breath. Negative for cough.   Cardiovascular: Positive for palpitations. Negative for chest pain and leg swelling.  Gastrointestinal: Negative for abdominal pain, diarrhea, nausea and vomiting.  Genitourinary: Negative for dysuria.  Musculoskeletal: Negative for back pain and neck pain.  Skin: Negative for rash.  Neurological: Negative for dizziness, light-headedness and headaches.  Hematological: Does not bruise/bleed easily.  Psychiatric/Behavioral: Negative for confusion.    Physical Exam Updated Vital Signs BP 115/86   Pulse (!) 41   Temp 97.6 F (36.4 C) (Oral)   Resp 17   Ht 1.829 m (6')   Wt (!) 138.3 kg   SpO2 97%   BMI 41.37 kg/m   Physical Exam Vitals and nursing note reviewed.  Constitutional:      Appearance: Normal appearance. He is well-developed.  HENT:     Head: Normocephalic and atraumatic.  Eyes:     Extraocular Movements: Extraocular movements intact.     Conjunctiva/sclera: Conjunctivae normal.     Pupils: Pupils are equal, round, and reactive to light.  Cardiovascular:     Rate and Rhythm: Normal rate. Rhythm irregular.     Heart sounds: No murmur heard.   Pulmonary:     Effort: Pulmonary effort is normal. No respiratory distress.     Breath sounds: Normal breath sounds. No wheezing.  Abdominal:     Palpations: Abdomen is soft.     Tenderness: There is no abdominal tenderness.  Musculoskeletal:     Cervical back: Neck supple.     Right lower leg: No edema.     Left lower leg: No edema.  Skin:    General: Skin is warm and dry.     Capillary Refill: Capillary refill takes less than 2 seconds.  Neurological:     General: No focal deficit present.     Mental Status: He is alert and oriented to person, place, and time.     Cranial Nerves: No cranial nerve  deficit.     ED Results / Procedures / Treatments   Labs (all labs ordered are listed, but only abnormal results are displayed) Labs Reviewed  BASIC METABOLIC PANEL - Abnormal; Notable for the following components:      Result Value   Potassium 2.6 (*)    Chloride 91 (*)    Glucose, Bld 182 (*)    Anion gap 16 (*)    All other components within normal limits  CBC - Abnormal; Notable for the following components:   Platelets 74 (*)    All other components within normal limits  HEPATIC FUNCTION PANEL - Abnormal; Notable for the following components:   Total Protein 8.3 (*)    AST 52 (*)    Total Bilirubin 3.0 (*)    Bilirubin, Direct 0.7 (*)    Indirect Bilirubin 2.3 (*)    All other components within normal limits  RAPID URINE DRUG SCREEN, HOSP PERFORMED - Abnormal; Notable for the following components:   Tetrahydrocannabinol POSITIVE (*)    All other components within normal limits  URINALYSIS, ROUTINE W REFLEX MICROSCOPIC - Abnormal; Notable for the following components:   Color, Urine AMBER (*)    All other components within normal limits  BRAIN NATRIURETIC PEPTIDE - Abnormal; Notable for the following components:   B Natriuretic Peptide 106.0 (*)    All other components within normal limits  RESPIRATORY PANEL BY RT PCR (FLU A&B,  COVID)  LIPASE, BLOOD  PROTIME-INR  ETHANOL    EKG EKG Interpretation  Date/Time:  Wednesday October 14 2019 17:13:41 EDT Ventricular Rate:  70 PR Interval:    QRS Duration: 82 QT Interval:  456 QTC Calculation: 492 R Axis:   109 Text Interpretation: Atrial fibrillation Low voltage QRS Lateral infarct , age undetermined Abnormal ECG Confirmed by Fredia Sorrow [phone]) on 10/14/2019 6:58:23 PM   Radiology DG Chest 2 View  Result Date: 10/14/2019 CLINICAL DATA:  Shortness of breath. EXAM: CHEST - 2 VIEW COMPARISON:  08/16/2019 FINDINGS: The cardiomediastinal contours are unchanged, stable upper normal heart size. The lungs are clear.  Pulmonary vasculature is normal. No consolidation, pleural effusion, or pneumothorax. No acute osseous abnormalities are seen. IMPRESSION: No acute chest findings. Electronically Signed   By: Keith Rake M.D.   On: 10/14/2019 18:14    Procedures Procedures (including critical care time)  CRITICAL CARE Performed by: Fredia Sorrow Total critical care time: 40 minutes Critical care time was exclusive of separately billable procedures and treating other patients. Critical care was necessary to treat or prevent imminent or life-threatening deterioration. Critical care was time spent personally by me on the following activities: development of treatment plan with patient and/or surrogate as well as nursing, discussions with consultants, evaluation of patient's response to treatment, examination of patient, obtaining history from patient or surrogate, ordering and performing treatments and interventions, ordering and review of laboratory studies, ordering and review of radiographic studies, pulse oximetry and re-evaluation of patient's condition.   Medications Ordered in ED Medications  0.9 %  sodium chloride infusion (1,000 mLs Intravenous New Bag/Given 10/14/19 1934)  potassium chloride 10 mEq in 100 mL IVPB (10 mEq Intravenous New Bag/Given 10/14/19 2324)  potassium chloride SA (KLOR-CON) CR tablet 40 mEq (40 mEq Oral Given 10/14/19 1851)  LORazepam (ATIVAN) injection 1 mg (1 mg Intravenous Given 10/14/19 1935)  potassium chloride 10 mEq in 100 mL IVPB (0 mEq Intravenous Stopped 10/14/19 2044)  potassium chloride 10 mEq in 100 mL IVPB (10 mEq Intravenous New Bag/Given 10/14/19 2158)  potassium chloride SA (KLOR-CON) CR tablet 40 mEq (40 mEq Oral Given 10/14/19 2322)    ED Course  I have reviewed the triage vital signs and the nursing notes.  Pertinent labs & imaging results that were available during my care of the patient were reviewed by me and considered in my medical decision making (see  chart for details).    MDM Rules/Calculators/A&P                         Main concern was for the critical potassium of 2.6.  Also patient given 1 mg of Ativan although not significantly tachycardic his little bit of anxiety was having resolved significantly.  Patient not showing any signs of acute alcohol withdrawal.  Chest x-ray was clear no evidence of any pulmonary edema.  His BNP is kind of baseline for him.  Cardiac monitoring showed atrial fibrillation rate controlled.  Patient was given 10 mEq of potassium IV and 40 mg of potassium p.o.  Second 10 mEq of potassium was ordered.  Then contacted hospitalist was thinking in terms of need for admission due to the critical potassium.  Hospitalist did put a note in.  Since this was chronic and patient not having vomiting or diarrhea felt that we could treat him with another 10 mEq of potassium for so a total of 30 IV and then also a another 40 mg  of potassium p.o. for a total of 80 p.o.  Patient then went on to state that he thinks he is taking his potassium at home.  But either way hospitalist suggested that we do 40 mEq of potassium twice daily for the next few days and then close follow-up with his primary care provider.  No evidence of any significant shortness of breath no evidence clinically of any concern for pulmonary embolus.  No evidence of any pulmonary edema.  Patient stable for discharge home.  In addition hospital stated that we did not need to recheck his potassium level prior to discharge.  Final Clinical Impression(s) / ED Diagnoses Final diagnoses:  Hypokalemia  SOB (shortness of breath)    Rx / DC Orders ED Discharge Orders         Ordered    potassium chloride SA (KLOR-CON) 20 MEQ tablet  2 times daily        10/14/19 2346           Fredia Sorrow, MD 10/14/19 2113    Fredia Sorrow, MD 10/14/19 2351    Fredia Sorrow, MD 10/14/19 2352

## 2019-10-14 NOTE — ED Notes (Signed)
Pt given a bag lunch and gingerale.

## 2019-10-14 NOTE — Telephone Encounter (Signed)
Was on phone with pt for approx 25 minutes and conversation was hard to follow pt also called [facility] and was on hold with them at the same time was on phone with me  Pt notes for 3 days SOB feels maybe CHF also having panic attack Per pt felt bad yesterday and almost went to hospital but "toughed it out" pt fell asleep early yesterday without CPAP and woke up early morning and put CPAP on and slept until 1:30 Pt feels weight is stable. After reviewing meds appears pt has not been taking Metoprolol for a few days and also requesting refill on Diltiazem 90 mg prn  Pt checked B/P and HR and results were 173/109 and 97 Pt wanting to know if should go to ED Informed pt if not feeling well then needs to [facility] and go to ED for eval and tx Pt verbalizes understanding Will forward to Dr Percival Spanish for review .Adonis Housekeeper

## 2019-10-14 NOTE — ED Notes (Signed)
Critical call from lab   K 2.6   Triage notified as well as Dr Earnest Conroy

## 2019-10-14 NOTE — Progress Notes (Signed)
Called to admit [name], 54 year old male with history of diastolic CHF, cirrhosis, asthma COPD, psoriasis rheumatoid arthritis, gout or DM.  Patient came to the ED with complaints of difficulty breathing.  Per ED provider he does not appear dyspneic, and is well-appearing.  Vitals stable.  Temperature 97.6.  Not tachycardic, heart rate 70s to 87.  Respiratory rate 13-20, blood pressure systolic 263-785.  O2 sats greater than 97% on room air.  Chest x-ray without acute abnormality.  Covid test negative.  Blood work shows potassium of 2.6.  Otherwise unremarkable.  Creatinine stable 0.87.  Patient is on torsemide 80 mg twice daily.  Patient has not been compliant with his oral potassium supplementations at home.  At this time I do not think patient meets criteria for admission for hypokalemia of 2.6.  He is not vomiting, no diarrhea, and has good oral intake.  Hypokalemia is likely due to diuretics, and poor compliance with his potassium supplements.  Supplementation has begun in the ED with 40 mEq of potassium which he has tolerated and 10 mEq IV KCl.  Recommendations-give additional 20 mEq of KCl -Give additional 40 mEq of KCl orally -Emphasize compliance with his home potassium supplementations 40 mEq twice daily -Check magnesium in the ED -Patient to follow-up with his primary care provider/cardiologist in 2 to 3 days for BMP check -His report of difficulty breathing appears to be anxiety driven, follow-up with primary care provider -Disposition per ED provider.   Bing Neighbors, MD [facility] 10/14/19 10:19 PM   LOS- NO CHARGE.

## 2019-10-14 NOTE — Telephone Encounter (Signed)
Pt c/o Shortness Of Breath: STAT if SOB developed within the last 24 hours or pt is noticeably SOB on the phone  1. Are you currently SOB (can you hear that pt is SOB on the phone)? No   2. How long have you been experiencing SOB? About 2 days per patient   3. Are you SOB when sitting or when up moving around? Both - patient states SOB is worse when he is at rest  4. Are you currently experiencing any other symptoms? No

## 2019-10-14 NOTE — Telephone Encounter (Signed)
Pt aware of recommendations ./cy

## 2019-10-14 NOTE — Telephone Encounter (Signed)
Pt called back and wanted to add that he use to be on Xanax and it use to help calm him down.  Pt stated he is currently taking buprenorphine (SUBUTEX) 2 MG SUBL SL tablet

## 2019-10-15 ENCOUNTER — Ambulatory Visit: Payer: Medicare HMO | Admitting: Family Medicine

## 2019-10-16 ENCOUNTER — Encounter: Payer: Self-pay | Admitting: Family Medicine

## 2019-10-19 ENCOUNTER — Telehealth: Payer: Self-pay

## 2019-10-20 NOTE — Telephone Encounter (Signed)
He needs to come in for an ER follow up so we can recheck these numbers, difficult to make decision without recheck. Caryl Pina, MD [facility] 10/20/2019, 7:58 AM

## 2019-10-20 NOTE — Telephone Encounter (Signed)
Pt is scheduled for 11/11/19. Offered to schedule sooner but pt wants to keep the 11/3 date. Just want Dr. Warrick Parisian to refill is potassium when he runs out.

## 2019-10-21 ENCOUNTER — Other Ambulatory Visit (INDEPENDENT_AMBULATORY_CARE_PROVIDER_SITE_OTHER): Payer: Self-pay | Admitting: Gastroenterology

## 2019-10-21 DIAGNOSIS — M87038 Idiopathic aseptic necrosis of left carpus: Secondary | ICD-10-CM | POA: Diagnosis not present

## 2019-10-21 NOTE — Telephone Encounter (Signed)
Patient last seen by Laurine Blazer 06/10/2019 for Hepatic Cirrhosis.

## 2019-10-22 ENCOUNTER — Other Ambulatory Visit (HOSPITAL_COMMUNITY): Payer: Self-pay

## 2019-10-22 DIAGNOSIS — D509 Iron deficiency anemia, unspecified: Secondary | ICD-10-CM

## 2019-10-22 DIAGNOSIS — D708 Other neutropenia: Secondary | ICD-10-CM

## 2019-10-22 DIAGNOSIS — D696 Thrombocytopenia, unspecified: Secondary | ICD-10-CM

## 2019-10-22 DIAGNOSIS — D61818 Other pancytopenia: Secondary | ICD-10-CM

## 2019-10-23 ENCOUNTER — Other Ambulatory Visit: Payer: Self-pay

## 2019-10-23 ENCOUNTER — Inpatient Hospital Stay (HOSPITAL_COMMUNITY): Payer: Medicare HMO | Attending: Hematology

## 2019-10-23 DIAGNOSIS — D696 Thrombocytopenia, unspecified: Secondary | ICD-10-CM

## 2019-10-23 DIAGNOSIS — R69 Illness, unspecified: Secondary | ICD-10-CM | POA: Diagnosis not present

## 2019-10-23 DIAGNOSIS — R161 Splenomegaly, not elsewhere classified: Secondary | ICD-10-CM | POA: Diagnosis not present

## 2019-10-23 DIAGNOSIS — K7030 Alcoholic cirrhosis of liver without ascites: Secondary | ICD-10-CM | POA: Diagnosis not present

## 2019-10-23 DIAGNOSIS — K449 Diaphragmatic hernia without obstruction or gangrene: Secondary | ICD-10-CM | POA: Insufficient documentation

## 2019-10-23 DIAGNOSIS — R7401 Elevation of levels of liver transaminase levels: Secondary | ICD-10-CM | POA: Diagnosis not present

## 2019-10-23 DIAGNOSIS — D61818 Other pancytopenia: Secondary | ICD-10-CM | POA: Insufficient documentation

## 2019-10-23 DIAGNOSIS — D708 Other neutropenia: Secondary | ICD-10-CM

## 2019-10-23 DIAGNOSIS — D509 Iron deficiency anemia, unspecified: Secondary | ICD-10-CM

## 2019-10-23 DIAGNOSIS — E611 Iron deficiency: Secondary | ICD-10-CM | POA: Diagnosis not present

## 2019-10-23 LAB — CBC WITH DIFFERENTIAL/PLATELET
Abs Immature Granulocytes: 0 10*3/uL (ref 0.00–0.07)
Basophils Absolute: 0 10*3/uL (ref 0.0–0.1)
Basophils Relative: 2 %
Eosinophils Absolute: 0.1 10*3/uL (ref 0.0–0.5)
Eosinophils Relative: 9 %
HCT: 33.9 % — ABNORMAL LOW (ref 39.0–52.0)
Hemoglobin: 11.2 g/dL — ABNORMAL LOW (ref 13.0–17.0)
Immature Granulocytes: 0 %
Lymphocytes Relative: 24 %
Lymphs Abs: 0.3 10*3/uL — ABNORMAL LOW (ref 0.7–4.0)
MCH: 29.4 pg (ref 26.0–34.0)
MCHC: 33 g/dL (ref 30.0–36.0)
MCV: 89 fL (ref 80.0–100.0)
Monocytes Absolute: 0.2 10*3/uL (ref 0.1–1.0)
Monocytes Relative: 11 %
Neutro Abs: 0.8 10*3/uL — ABNORMAL LOW (ref 1.7–7.7)
Neutrophils Relative %: 54 %
Platelets: 40 10*3/uL — ABNORMAL LOW (ref 150–400)
RBC: 3.81 MIL/uL — ABNORMAL LOW (ref 4.22–5.81)
RDW: 13.9 % (ref 11.5–15.5)
WBC: 1.4 10*3/uL — CL (ref 4.0–10.5)
nRBC: 0 % (ref 0.0–0.2)

## 2019-10-23 LAB — IRON AND TIBC
Iron: 36 ug/dL — ABNORMAL LOW (ref 45–182)
Saturation Ratios: 12 % — ABNORMAL LOW (ref 17.9–39.5)
TIBC: 309 ug/dL (ref 250–450)
UIBC: 273 ug/dL

## 2019-10-23 LAB — COMPREHENSIVE METABOLIC PANEL
ALT: 13 U/L (ref 0–44)
AST: 25 U/L (ref 15–41)
Albumin: 3.3 g/dL — ABNORMAL LOW (ref 3.5–5.0)
Alkaline Phosphatase: 82 U/L (ref 38–126)
Anion gap: 5 (ref 5–15)
BUN: 8 mg/dL (ref 6–20)
CO2: 27 mmol/L (ref 22–32)
Calcium: 9.1 mg/dL (ref 8.9–10.3)
Chloride: 102 mmol/L (ref 98–111)
Creatinine, Ser: 0.66 mg/dL (ref 0.61–1.24)
GFR, Estimated: >60 mL/min (ref >60)
Glucose, Bld: 103 mg/dL — ABNORMAL HIGH (ref 70–99)
Potassium: 3.9 mmol/L (ref 3.5–5.1)
Sodium: 134 mmol/L — ABNORMAL LOW (ref 135–145)
Total Bilirubin: 1.2 mg/dL (ref 0.3–1.2)
Total Protein: 6.7 g/dL (ref 6.5–8.1)

## 2019-10-23 LAB — LACTATE DEHYDROGENASE: LDH: 112 U/L (ref 98–192)

## 2019-10-23 LAB — FERRITIN: Ferritin: 87 ng/mL (ref 24–336)

## 2019-10-23 LAB — VITAMIN B12: Vitamin B-12: 258 pg/mL (ref 180–914)

## 2019-10-23 LAB — VITAMIN D 25 HYDROXY (VIT D DEFICIENCY, FRACTURES): Vit D, 25-Hydroxy: 41.52 ng/mL (ref 30–100)

## 2019-10-28 ENCOUNTER — Inpatient Hospital Stay (HOSPITAL_BASED_OUTPATIENT_CLINIC_OR_DEPARTMENT_OTHER): Payer: Medicare HMO | Admitting: Hematology

## 2019-10-28 ENCOUNTER — Telehealth (HOSPITAL_COMMUNITY): Payer: Self-pay

## 2019-10-28 DIAGNOSIS — G4733 Obstructive sleep apnea (adult) (pediatric): Secondary | ICD-10-CM | POA: Diagnosis not present

## 2019-10-28 DIAGNOSIS — D509 Iron deficiency anemia, unspecified: Secondary | ICD-10-CM

## 2019-10-28 DIAGNOSIS — D61818 Other pancytopenia: Secondary | ICD-10-CM | POA: Diagnosis not present

## 2019-10-28 DIAGNOSIS — J449 Chronic obstructive pulmonary disease, unspecified: Secondary | ICD-10-CM | POA: Diagnosis not present

## 2019-10-28 NOTE — Telephone Encounter (Signed)
I attempted to call patient twice for his virtual office visit with Dr. Delton Coombes with no response.

## 2019-10-28 NOTE — Progress Notes (Signed)
Virtual Visit via Telephone Note  I connected with [name] on 10/28/19 at  3:45 PM EDT by telephone and verified that I am speaking with the correct person using two identifiers.  Location: Patient: At home Provider: In the office   I discussed the limitations, risks, security and privacy concerns of performing an evaluation and management service by telephone and the availability of in person appointments. I also discussed with the patient that there may be a patient responsible charge related to this service. The patient expressed understanding and agreed to proceed.   History of Present Illness: [name] was seen in our office for pancytopenia secondary to splenomegaly from cirrhosis.  He also had a bone marrow biopsy on 10/24/2015 showing normocellular bone marrow with trilineage hematopoiesis and normal cytogenetics.   Observations/Objective: Reports that he has been fairly doing well.  Denies any fevers, night sweats or weight loss.  Denies any nosebleeds, bleeding per rectum, melena or hematuria.  Assessment and Plan:  1.  Pancytopenia: -Reviewed CBC from 10/23/2019.  White count is 1.4 with ANC of 800.  No recurrent infections. -Platelet count is 40 and stable. -As there are no major changes, will follow up in 4 months.  2.  Iron deficiency state: -Cannot take iron pills. -Venofer 5 infusions from 07/17/2019 through 08/03/2019. -Hemoglobin improved 11.2.  Ferritin is 87 and percent saturation is 12. -We will follow up hemoglobin, ferritin and iron panel in 4 months.  Follow Up Instructions: RTC 4 months with labs.   I discussed the assessment and treatment plan with the patient. The patient was provided an opportunity to ask questions and all were answered. The patient agreed with the plan and demonstrated an understanding of the instructions.   The patient was advised to call back or seek an in-person evaluation if the symptoms worsen or if the condition fails to  improve as anticipated.  I provided 11 minutes of non-face-to-face time during this encounter.    , MD   

## 2019-10-29 ENCOUNTER — Ambulatory Visit (HOSPITAL_COMMUNITY): Payer: Medicare HMO | Admitting: Nurse Practitioner

## 2019-10-30 ENCOUNTER — Other Ambulatory Visit: Payer: Self-pay | Admitting: Family Medicine

## 2019-10-30 DIAGNOSIS — Z9189 Other specified personal risk factors, not elsewhere classified: Secondary | ICD-10-CM

## 2019-10-30 DIAGNOSIS — E1165 Type 2 diabetes mellitus with hyperglycemia: Secondary | ICD-10-CM

## 2019-11-05 DIAGNOSIS — M25532 Pain in left wrist: Secondary | ICD-10-CM | POA: Diagnosis not present

## 2019-11-05 DIAGNOSIS — M87038 Idiopathic aseptic necrosis of left carpus: Secondary | ICD-10-CM | POA: Diagnosis not present

## 2019-11-10 DIAGNOSIS — M130 Polyarthritis, unspecified: Secondary | ICD-10-CM | POA: Diagnosis not present

## 2019-11-10 DIAGNOSIS — Z79891 Long term (current) use of opiate analgesic: Secondary | ICD-10-CM | POA: Diagnosis not present

## 2019-11-10 DIAGNOSIS — M109 Gout, unspecified: Secondary | ICD-10-CM | POA: Diagnosis not present

## 2019-11-10 DIAGNOSIS — G894 Chronic pain syndrome: Secondary | ICD-10-CM | POA: Diagnosis not present

## 2019-11-10 DIAGNOSIS — R69 Illness, unspecified: Secondary | ICD-10-CM | POA: Diagnosis not present

## 2019-11-10 DIAGNOSIS — G4733 Obstructive sleep apnea (adult) (pediatric): Secondary | ICD-10-CM | POA: Diagnosis not present

## 2019-11-10 DIAGNOSIS — G5712 Meralgia paresthetica, left lower limb: Secondary | ICD-10-CM | POA: Diagnosis not present

## 2019-11-11 ENCOUNTER — Encounter: Payer: Self-pay | Admitting: Family Medicine

## 2019-11-11 ENCOUNTER — Ambulatory Visit (INDEPENDENT_AMBULATORY_CARE_PROVIDER_SITE_OTHER): Payer: Medicare HMO | Admitting: Family Medicine

## 2019-11-11 DIAGNOSIS — I152 Hypertension secondary to endocrine disorders: Secondary | ICD-10-CM

## 2019-11-11 DIAGNOSIS — E782 Mixed hyperlipidemia: Secondary | ICD-10-CM | POA: Diagnosis not present

## 2019-11-11 DIAGNOSIS — E1165 Type 2 diabetes mellitus with hyperglycemia: Secondary | ICD-10-CM

## 2019-11-11 DIAGNOSIS — R69 Illness, unspecified: Secondary | ICD-10-CM | POA: Diagnosis not present

## 2019-11-11 DIAGNOSIS — E119 Type 2 diabetes mellitus without complications: Secondary | ICD-10-CM | POA: Diagnosis not present

## 2019-11-11 DIAGNOSIS — Z9189 Other specified personal risk factors, not elsewhere classified: Secondary | ICD-10-CM

## 2019-11-11 DIAGNOSIS — E1159 Type 2 diabetes mellitus with other circulatory complications: Secondary | ICD-10-CM | POA: Diagnosis not present

## 2019-11-11 DIAGNOSIS — F410 Panic disorder [episodic paroxysmal anxiety] without agoraphobia: Secondary | ICD-10-CM

## 2019-11-11 MED ORDER — METOPROLOL TARTRATE 25 MG PO TABS
25.0000 mg | ORAL_TABLET | Freq: Two times a day (BID) | ORAL | 3 refills | Status: DC
Start: 2019-11-11 — End: 2022-07-30

## 2019-11-11 MED ORDER — TORSEMIDE 20 MG PO TABS
80.0000 mg | ORAL_TABLET | Freq: Two times a day (BID) | ORAL | 3 refills | Status: DC
Start: 2019-11-11 — End: 2022-06-05

## 2019-11-11 MED ORDER — GABAPENTIN 800 MG PO TABS: 800.0000 mg | ORAL_TABLET | Freq: Three times a day (TID) | ORAL | 3 refills | Status: DC

## 2019-11-11 MED ORDER — BUSPIRONE HCL 10 MG PO TABS: 10.0000 mg | ORAL_TABLET | Freq: Three times a day (TID) | ORAL | 3 refills | Status: DC

## 2019-11-11 MED ORDER — PRAVASTATIN SODIUM 10 MG PO TABS: 10.0000 mg | ORAL_TABLET | Freq: Every day | ORAL | 3 refills | Status: DC

## 2019-11-11 MED ORDER — EZETIMIBE 10 MG PO TABS
10.0000 mg | ORAL_TABLET | Freq: Every day | ORAL | 5 refills | Status: DC
Start: 2019-11-11 — End: 2023-11-04

## 2019-11-11 NOTE — Progress Notes (Signed)
Virtual Visit via telephone Note  I connected with [name] on 11/11/19 at 1453 by telephone and verified that I am speaking with the correct person using two identifiers. [name] is currently located at home and patient are currently with her during visit. The provider, Fransisca Kaufmann Josie Mesa, MD is located in their office at time of visit.  Call ended at 1514  I discussed the limitations, risks, security and privacy concerns of performing an evaluation and management service by telephone and the availability of in person appointments. I also discussed with the patient that there may be a patient responsible charge related to this service. The patient expressed understanding and agreed to proceed.   History and Present Illness: Type 2 diabetes mellitus Patient comes in today for recheck of his diabetes. Patient has been currently taking no medication BS 100-110. Patient is not currently on an ACE inhibitor/ARB. Patient has not seen an ophthalmologist this year. Patient denies any issues with their feet. The symptom started onset as an adult htn and hld ARE RELATED TO DM   Hypertension and CHF Patient is currently on diltiazem metolazone and metoprolol and torsemide, and their blood pressure today is 117/85. Patient denies any lightheadedness or dizziness. Patient denies headaches, blurred vision, chest pains, shortness of breath, or weakness. Denies any side effects from medication and is content with current medication.   Hyperlipidemia Patient is coming in for recheck of his hyperlipidemia. The patient is currently taking zetia and pravastatin. They deny any issues with myalgias or history of liver damage from it. They deny any focal numbness or weakness or chest pain.   No diagnosis found.  Outpatient Encounter Medications as of 11/11/2019  Medication Sig  . aspirin EC 81 MG tablet Take 81 mg by mouth as needed (CHEST PAIN).   Marland Kitchen baclofen (LIORESAL) 10 MG tablet Take 1  tablet (10 mg total) by mouth 3 (three) times daily.  . betamethasone dipropionate 0.05 % cream APPLY TO AFFECTED AREA TWICE A DAY (Patient taking differently: Apply 1 application topically daily as needed (itching). )  . BOOSTRIX 5-2.5-18.5 LF-MCG/0.5 injection   . buprenorphine (SUBUTEX) 2 MG SUBL SL tablet 2 mg. Place 2 tablets under tongue twice daily (Patient not taking: Reported on 10/28/2019)  . buprenorphine-naloxone (SUBOXONE) 8-2 mg SUBL SL tablet Place 0.5 tablets under the tongue 2 (two) times daily.  . busPIRone (BUSPAR) 10 MG tablet TAKE 1 TABLET BY MOUTH THREE TIMES A DAY  . colchicine 0.6 MG tablet Take 0.6 mg by mouth 2 (two) times daily as needed.   . diclofenac Sodium (VOLTAREN) 1 % GEL Apply 4 g topically 4 (four) times daily.  Marland Kitchen diltiazem (CARDIZEM SR) 90 MG 12 hr capsule Take 1 capsule (90 mg total) by mouth daily as needed. palpitations  . diltiazem (CARTIA XT) 300 MG 24 hr capsule TAKE ONE CAPSULE BY MOUTH EVERY DAY (Patient taking differently: Take 300 mg by mouth daily. TAKE ONE CAPSULE BY MOUTH EVERY DAY)  . diltiazem (TIAZAC) 300 MG 24 hr capsule diltiazem CD 300 mg capsule,extended release 24 hr  . DULoxetine (CYMBALTA) 60 MG capsule Take 60 mg by mouth at bedtime.   Marland Kitchen esomeprazole (NEXIUM) 40 MG capsule Take 1 capsule (40 mg total) by mouth daily.  Marland Kitchen ezetimibe (ZETIA) 10 MG tablet TAKE 1 TABLET BY MOUTH EVERY DAY (Patient taking differently: Take 10 mg by mouth daily. )  . fluticasone (FLONASE) 50 MCG/ACT nasal spray USE TWO SPRAYS IN EACH NOSTRIL EVERY  DAY AS NEEDED  . folic acid (FOLVITE) 1 MG tablet TAKE ONE TABLET BY MOUTH ONCE DAILY - EMERGENCY REFILL FAXED DR. (Patient taking differently: Take 1 mg by mouth daily. )  . gabapentin (NEURONTIN) 800 MG tablet Take 1 tablet (800 mg total) by mouth 3 (three) times daily.  Marland Kitchen HYDROmorphone (DILAUDID) 4 MG tablet Take 4 mg by mouth every 4 (four) hours as needed for moderate pain or severe pain.   . indomethacin (INDOCIN)  50 MG capsule Take 50 mg by mouth 3 (three) times daily as needed.  Marland Kitchen ipratropium (ATROVENT) 0.02 % nebulizer solution Take 0.5 mg by nebulization every 6 (six) hours as needed for wheezing or shortness of breath.   . lactulose (CHRONULAC) 10 GM/15ML solution TAKE 2 TABLESPOONS BY MOUTH TWICE DAILY  . lidocaine (XYLOCAINE) 5 % ointment Apply 1 application topically as needed.  . magnesium oxide (MAG-OX) 400 MG tablet TAKE 400 MG BY MOUTH DAILY.  Marland Kitchen Menthol-Methyl Salicylate (MUSCLE RUB) 10-15 % CREA Apply 1 application topically 3 (three) times daily as needed for muscle pain. For muscle pain   . metolazone (ZAROXOLYN) 2.5 MG tablet Take 1 tablet (2.5 mg total) by mouth 3 (three) times a week.  . metoprolol tartrate (LOPRESSOR) 25 MG tablet TAKE 1 TABLET BY MOUTH TWICE A DAY  . Multiple Vitamins-Minerals (MULTI COMPLETE PO) Take 1 tablet by mouth daily.   Marland Kitchen neomycin-bacitracin-polymyxin (NEOSPORIN) ointment Apply 1 application topically every 12 (twelve) hours as needed for wound care. Reported on 06/01/2015  . OneTouch Delica Lancets 70Y MISC Test BS QID Dx E11.9  . potassium chloride SA (KLOR-CON) 20 MEQ tablet Take 2 tablets (40 mEq total) by mouth 2 (two) times daily.  . potassium chloride SA (KLOR-CON) 20 MEQ tablet Take 2 tablets (40 mEq total) by mouth 2 (two) times daily.  . pravastatin (PRAVACHOL) 10 MG tablet TAKE 1 TABLET BY MOUTH EVERY DAY  . QUEtiapine (SEROQUEL) 100 MG tablet Take 1 tablet (100 mg total) by mouth at bedtime.  . torsemide (DEMADEX) 20 MG tablet TAKE 4 TABLETS BY MOUTH TWICE A DAY (Patient taking differently: Take 80 mg by mouth 2 (two) times daily. )   No facility-administered encounter medications on file as of 11/11/2019.    Review of Systems  Constitutional: Negative for chills and fever.  Eyes: Negative for visual disturbance.  Respiratory: Negative for shortness of breath and wheezing.   Cardiovascular: Positive for leg swelling. Negative for chest pain and  palpitations.  Musculoskeletal: Negative for back pain and gait problem.  Skin: Negative for rash.  Neurological: Negative for dizziness, weakness and light-headedness.  All other systems reviewed and are negative.   Observations/Objective: Patient sounds comfortable  Assessment and Plan: Problem List Items Addressed This Visit      Cardiovascular and Mediastinum   Hypertension associated with diabetes ([place])   Relevant Medications   ezetimibe (ZETIA) 10 MG tablet   metoprolol tartrate (LOPRESSOR) 25 MG tablet   pravastatin (PRAVACHOL) 10 MG tablet   torsemide (DEMADEX) 20 MG tablet     Endocrine   Type 2 diabetes mellitus without complication, without long-term current use of insulin ([facility]) - Primary   Relevant Medications   pravastatin (PRAVACHOL) 10 MG tablet   Other Relevant Orders   CBC with Differential/Platelet   CMP14+EGFR   Bayer DCA Hb A1c Waived     Other   Mixed hyperlipidemia   Relevant Medications   ezetimibe (ZETIA) 10 MG tablet   metoprolol tartrate (LOPRESSOR) 25 MG  tablet   pravastatin (PRAVACHOL) 10 MG tablet   torsemide (DEMADEX) 20 MG tablet   Other Relevant Orders   Lipid panel   Morbid (severe) obesity due to excess calories ([place])    Other Visit Diagnoses    Type 2 diabetes mellitus with hyperglycemia, without long-term current use of insulin (HCC)       Relevant Medications   gabapentin (NEURONTIN) 800 MG tablet   pravastatin (PRAVACHOL) 10 MG tablet   Other Relevant Orders   CMP14+EGFR   10 year risk of MI or stroke < 7.5%       Relevant Medications   pravastatin (PRAVACHOL) 10 MG tablet   Panic attack       Relevant Medications   busPIRone (BUSPAR) 10 MG tablet      Continue current medicines, may keep at higher refill rate of potassium after blood check.  Follow up plan: Return in about 3 months (around 02/12/2020), or if symptoms worsen or fail to improve, for diabetes recheck.     I discussed the assessment and treatment plan with  the patient. The patient was provided an opportunity to ask questions and all were answered. The patient agreed with the plan and demonstrated an understanding of the instructions.   The patient was advised to call back or seek an in-person evaluation if the symptoms worsen or if the condition fails to improve as anticipated.  The above assessment and management plan was discussed with the patient. The patient verbalized understanding of and has agreed to the management plan. Patient is aware to call the clinic if symptoms persist or worsen. Patient is aware when to return to the clinic for a follow-up visit. Patient educated on when it is appropriate to go to the emergency department.    I provided 21 minutes of non-face-to-face time during this encounter.    Worthy Rancher, MD

## 2019-11-16 ENCOUNTER — Other Ambulatory Visit: Payer: Self-pay

## 2019-11-16 ENCOUNTER — Other Ambulatory Visit: Payer: Medicare HMO

## 2019-11-16 DIAGNOSIS — E782 Mixed hyperlipidemia: Secondary | ICD-10-CM

## 2019-11-16 DIAGNOSIS — E119 Type 2 diabetes mellitus without complications: Secondary | ICD-10-CM | POA: Diagnosis not present

## 2019-11-16 DIAGNOSIS — E1165 Type 2 diabetes mellitus with hyperglycemia: Secondary | ICD-10-CM | POA: Diagnosis not present

## 2019-11-16 LAB — BAYER DCA HB A1C WAIVED: HB A1C (BAYER DCA - WAIVED): 4.9 % (ref <7.0)

## 2019-11-17 LAB — CBC WITH DIFFERENTIAL/PLATELET
Basophils Absolute: 0 10*3/uL (ref 0.0–0.2)
Basos: 1 %
EOS (ABSOLUTE): 0.1 10*3/uL (ref 0.0–0.4)
Eos: 6 %
Hematocrit: 37.8 % (ref 37.5–51.0)
Hemoglobin: 12.3 g/dL — ABNORMAL LOW (ref 13.0–17.7)
Immature Grans (Abs): 0 10*3/uL (ref 0.0–0.1)
Immature Granulocytes: 1 %
Lymphocytes Absolute: 0.4 10*3/uL — ABNORMAL LOW (ref 0.7–3.1)
Lymphs: 19 %
MCH: 28.4 pg (ref 26.6–33.0)
MCHC: 32.5 g/dL (ref 31.5–35.7)
MCV: 87 fL (ref 79–97)
Monocytes Absolute: 0.2 10*3/uL (ref 0.1–0.9)
Monocytes: 10 %
Neutrophils Absolute: 1.4 10*3/uL (ref 1.4–7.0)
Neutrophils: 63 %
Platelets: 46 10*3/uL — CL (ref 150–450)
RBC: 4.33 x10E6/uL (ref 4.14–5.80)
RDW: 13.3 % (ref 11.6–15.4)
WBC: 2.1 10*3/uL — CL (ref 3.4–10.8)

## 2019-11-17 LAB — LIPID PANEL
Chol/HDL Ratio: 2.5 ratio (ref 0.0–5.0)
Cholesterol, Total: 139 mg/dL (ref 100–199)
HDL: 56 mg/dL (ref >39)
LDL Chol Calc (NIH): 65 mg/dL (ref 0–99)
Triglycerides: 94 mg/dL (ref 0–149)
VLDL Cholesterol Cal: 18 mg/dL (ref 5–40)

## 2019-11-17 LAB — CMP14+EGFR
ALT: 8 IU/L (ref 0–44)
AST: 25 IU/L (ref 0–40)
Albumin/Globulin Ratio: 1.2 (ref 1.2–2.2)
Albumin: 3.7 g/dL — ABNORMAL LOW (ref 3.8–4.9)
Alkaline Phosphatase: 97 IU/L (ref 44–121)
BUN/Creatinine Ratio: 11 (ref 9–20)
BUN: 8 mg/dL (ref 6–24)
Bilirubin Total: 1 mg/dL (ref 0.0–1.2)
CO2: 27 mmol/L (ref 20–29)
Calcium: 9.3 mg/dL (ref 8.7–10.2)
Chloride: 102 mmol/L (ref 96–106)
Creatinine, Ser: 0.72 mg/dL — ABNORMAL LOW (ref 0.76–1.27)
GFR calc Af Amer: 121 mL/min/{1.73_m2} (ref >59)
GFR calc non Af Amer: 104 mL/min/{1.73_m2} (ref >59)
Globulin, Total: 3.1 g/dL (ref 1.5–4.5)
Glucose: 130 mg/dL — ABNORMAL HIGH (ref 65–99)
Potassium: 3.9 mmol/L (ref 3.5–5.2)
Sodium: 142 mmol/L (ref 134–144)
Total Protein: 6.8 g/dL (ref 6.0–8.5)

## 2019-11-20 DIAGNOSIS — X58XXXA Exposure to other specified factors, initial encounter: Secondary | ICD-10-CM | POA: Diagnosis not present

## 2019-11-20 DIAGNOSIS — M19011 Primary osteoarthritis, right shoulder: Secondary | ICD-10-CM | POA: Diagnosis not present

## 2019-11-20 DIAGNOSIS — M948X1 Other specified disorders of cartilage, shoulder: Secondary | ICD-10-CM | POA: Diagnosis not present

## 2019-11-20 DIAGNOSIS — M659 Synovitis and tenosynovitis, unspecified: Secondary | ICD-10-CM | POA: Diagnosis not present

## 2019-11-20 DIAGNOSIS — S46811A Strain of other muscles, fascia and tendons at shoulder and upper arm level, right arm, initial encounter: Secondary | ICD-10-CM | POA: Diagnosis not present

## 2019-11-20 DIAGNOSIS — M62511 Muscle wasting and atrophy, not elsewhere classified, right shoulder: Secondary | ICD-10-CM | POA: Diagnosis not present

## 2019-11-20 DIAGNOSIS — M6289 Other specified disorders of muscle: Secondary | ICD-10-CM | POA: Diagnosis not present

## 2019-11-20 DIAGNOSIS — M75121 Complete rotator cuff tear or rupture of right shoulder, not specified as traumatic: Secondary | ICD-10-CM | POA: Diagnosis not present

## 2019-11-23 ENCOUNTER — Telehealth: Payer: Self-pay | Admitting: Family Medicine

## 2019-11-23 DIAGNOSIS — E876 Hypokalemia: Secondary | ICD-10-CM

## 2019-11-23 NOTE — Telephone Encounter (Signed)
Pt wants to know if he needs to stay on his potassium pills 4x a day or if he can go back to 2x a day, will need a new rx for these when the amount is determined

## 2019-11-24 MED ORDER — POTASSIUM CHLORIDE CRYS ER 20 MEQ PO TBCR: 40.0000 meq | EXTENDED_RELEASE_TABLET | Freq: Two times a day (BID) | ORAL | 0 refills | Status: DC

## 2019-11-24 NOTE — Telephone Encounter (Signed)
Pt is aware of provider feedback and voiced understanding. Rx sent in and future order placed for pt to come back in for labs.

## 2019-11-24 NOTE — Telephone Encounter (Signed)
Patient's potassium was still 3.9 so I would continue on the dose that he is currently on which is 4 times a day, send a new prescription for him for this for the next 3 months.  He will need to come check it again in a month

## 2019-11-25 ENCOUNTER — Telehealth: Payer: Self-pay

## 2019-11-25 ENCOUNTER — Encounter (HOSPITAL_COMMUNITY): Payer: Self-pay

## 2019-11-25 NOTE — Telephone Encounter (Signed)
I would recommend him seeing his cardiologist and getting cardiology clearance prior to any surgery.

## 2019-11-25 NOTE — Progress Notes (Signed)
Patient called stating that he has upcoming surgeries. He states that he has a blood clot in his portal vein though and that he is wanting Dr. Raliegh Ip to review his U/S and blood work to see what can be done about it. He can be reached at [phone].  Dr. Delton Coombes states to let the surgeon reach out to [place]. Patient made aware and will have surgeon contact us.

## 2019-11-25 NOTE — Telephone Encounter (Signed)
Called patient, no answer, left detailed voice mail

## 2019-11-28 DIAGNOSIS — G4733 Obstructive sleep apnea (adult) (pediatric): Secondary | ICD-10-CM | POA: Diagnosis not present

## 2019-11-28 DIAGNOSIS — J449 Chronic obstructive pulmonary disease, unspecified: Secondary | ICD-10-CM | POA: Diagnosis not present

## 2019-12-17 DIAGNOSIS — M25511 Pain in right shoulder: Secondary | ICD-10-CM | POA: Diagnosis not present

## 2019-12-17 DIAGNOSIS — G8911 Acute pain due to trauma: Secondary | ICD-10-CM | POA: Diagnosis not present

## 2019-12-17 DIAGNOSIS — M75121 Complete rotator cuff tear or rupture of right shoulder, not specified as traumatic: Secondary | ICD-10-CM | POA: Diagnosis not present

## 2019-12-18 ENCOUNTER — Other Ambulatory Visit: Payer: Self-pay | Admitting: Family Medicine

## 2019-12-21 ENCOUNTER — Telehealth: Payer: Self-pay

## 2019-12-21 NOTE — Telephone Encounter (Signed)
Patient aware and verbalizes understanding. 

## 2019-12-21 NOTE — Telephone Encounter (Signed)
Patient states that Dr. Warrick Parisian ordered a lab for him before and it checked him for different cancers. Requesting to be re checked.

## 2019-12-21 NOTE — Telephone Encounter (Signed)
Everything we looked at looks good except for his low white blood cell count, he sees Dr. Delton Coombes who is a hematologist blood doctor in [place], he should follow up with him on that but besides that everything looks good that we saw on the blood work.

## 2019-12-28 DIAGNOSIS — G4733 Obstructive sleep apnea (adult) (pediatric): Secondary | ICD-10-CM | POA: Diagnosis not present

## 2019-12-28 DIAGNOSIS — J449 Chronic obstructive pulmonary disease, unspecified: Secondary | ICD-10-CM | POA: Diagnosis not present

## 2019-12-29 ENCOUNTER — Telehealth: Payer: Self-pay | Admitting: *Deleted

## 2019-12-29 DIAGNOSIS — Z79891 Long term (current) use of opiate analgesic: Secondary | ICD-10-CM | POA: Diagnosis not present

## 2019-12-29 DIAGNOSIS — G5712 Meralgia paresthetica, left lower limb: Secondary | ICD-10-CM | POA: Diagnosis not present

## 2019-12-29 DIAGNOSIS — M109 Gout, unspecified: Secondary | ICD-10-CM | POA: Diagnosis not present

## 2019-12-29 DIAGNOSIS — R69 Illness, unspecified: Secondary | ICD-10-CM | POA: Diagnosis not present

## 2019-12-29 DIAGNOSIS — G894 Chronic pain syndrome: Secondary | ICD-10-CM | POA: Diagnosis not present

## 2019-12-29 DIAGNOSIS — M130 Polyarthritis, unspecified: Secondary | ICD-10-CM | POA: Diagnosis not present

## 2019-12-29 DIAGNOSIS — G4733 Obstructive sleep apnea (adult) (pediatric): Secondary | ICD-10-CM | POA: Diagnosis not present

## 2019-12-29 NOTE — Chronic Care Management (AMB) (Signed)
  Chronic Care Management   Note  12/29/2019 Name: [name] MRN: [id number] DOB: 09-12-1965  Lillard Anes Latini is a 54 y.o. year old male who is a primary care patient of Dettinger, Fransisca Kaufmann, MD. I reached out to [place] by phone today in response to a referral sent by Mr. Lillard Anes [place]'s health plan.     Mr. Loyal was given information about Chronic Care Management services today including:  1. CCM service includes personalized support from designated clinical staff supervised by his physician, including individualized plan of care and coordination with other care providers 2. 24/7 contact phone numbers for assistance for urgent and routine care needs. 3. Service will only be billed when office clinical staff spend 20 minutes or more in a month to coordinate care. 4. Only one practitioner may furnish and bill the service in a calendar month. 5. The patient may stop CCM services at any time (effective at the end of the month) by phone call to the office staff. 6. The patient will be responsible for cost sharing (co-pay) of up to 20% of the service fee (after annual deductible is met).  Patient agreed to services and verbal consent obtained.   Follow up plan: Telephone appointment with care management team member scheduled for: 12/30/2019  [facility]  Direct Dial: [phone]

## 2019-12-30 ENCOUNTER — Ambulatory Visit: Payer: Medicare HMO | Admitting: *Deleted

## 2019-12-30 DIAGNOSIS — M109 Gout, unspecified: Secondary | ICD-10-CM

## 2019-12-30 DIAGNOSIS — E119 Type 2 diabetes mellitus without complications: Secondary | ICD-10-CM

## 2019-12-30 NOTE — Chronic Care Management (AMB) (Signed)
Chronic Care Management   Initial Visit Note  12/30/2019 Name: [name] MRN: [id number] DOB: Jan 15, 1965  Referred by: [name], [name] Kaufmann, MD Reason for referral : Chronic Care Management (Initial Visit)   [name] is a 54 y.o. year old male who is a primary care patient of [name], [name] Kaufmann, MD. The CCM team was consulted for assistance with chronic disease management and care coordination needs related to HTN, HLD, DMII, Depression and osteoarthritis  Review of patient status, including review of consultants reports, relevant laboratory and other test results, and collaboration with appropriate care team members and the patient's provider was performed as part of comprehensive patient evaluation and provision of chronic care management services.    SDOH (Social Determinants of Health) assessments performed: Yes See Care Plan activities for detailed interventions related to SDOH     Medications: Outpatient Encounter Medications as of 12/30/2019  Medication Sig Note   aspirin EC 81 MG tablet Take 81 mg by mouth as needed (CHEST PAIN).  06/10/2019: PRN   baclofen (LIORESAL) 10 MG tablet Take 1 tablet (10 mg total) by mouth 3 (three) times daily.    betamethasone dipropionate 0.05 % cream APPLY TO AFFECTED AREA TWICE A DAY (Patient taking differently: Apply 1 application topically daily as needed (itching). )    BOOSTRIX 5-2.5-18.5 LF-MCG/0.5 injection     buprenorphine (SUBUTEX) 2 MG SUBL SL tablet 2 mg. Place 2 tablets under tongue twice daily (Patient not taking: Reported on 10/28/2019) 10/14/2019: Pt states doctor goes back and forth between subutex and suboxene.   buprenorphine-naloxone (SUBOXONE) 8-2 mg SUBL SL tablet Place 0.5 tablets under the tongue 2 (two) times daily.    busPIRone (BUSPAR) 10 MG tablet Take 1 tablet (10 mg total) by mouth 3 (three) times daily.    colchicine 0.6 MG tablet Take 0.6 mg by mouth 2 (two) times daily as needed.     diclofenac  Sodium (VOLTAREN) 1 % GEL Apply 4 g topically 4 (four) times daily.    diltiazem (CARDIZEM SR) 90 MG 12 hr capsule Take 1 capsule (90 mg total) by mouth daily as needed. palpitations    diltiazem (CARTIA XT) 300 MG 24 hr capsule TAKE ONE CAPSULE BY MOUTH EVERY DAY (Patient taking differently: Take 300 mg by mouth daily. TAKE ONE CAPSULE BY MOUTH EVERY DAY)    diltiazem (TIAZAC) 300 MG 24 hr capsule diltiazem CD 300 mg capsule,extended release 24 hr    DULoxetine (CYMBALTA) 60 MG capsule Take 60 mg by mouth at bedtime.     esomeprazole (NEXIUM) 40 MG capsule Take 1 capsule (40 mg total) by mouth daily.    ezetimibe (ZETIA) 10 MG tablet Take 1 tablet (10 mg total) by mouth daily.    fluticasone (FLONASE) 50 MCG/ACT nasal spray USE TWO SPRAYS IN EACH NOSTRIL EVERY DAY AS NEEDED    folic acid (FOLVITE) 1 MG tablet TAKE ONE TABLET BY MOUTH ONCE DAILY - EMERGENCY REFILL FAXED DR. (Patient taking differently: Take 1 mg by mouth daily. )    gabapentin (NEURONTIN) 800 MG tablet Take 1 tablet (800 mg total) by mouth 3 (three) times daily.    HYDROmorphone (DILAUDID) 4 MG tablet Take 4 mg by mouth every 4 (four) hours as needed for moderate pain or severe pain.     indomethacin (INDOCIN) 50 MG capsule Take 50 mg by mouth 3 (three) times daily as needed.    ipratropium (ATROVENT) 0.02 % nebulizer solution Take 0.5 mg by nebulization every 6 (  six) hours as needed for wheezing or shortness of breath.     KLOR-CON M20 20 MEQ tablet TAKE 2 TABLETS BY MOUTH 2 TIMES DAILY.    lactulose (CHRONULAC) 10 GM/15ML solution TAKE 2 TABLESPOONS BY MOUTH TWICE DAILY 06/10/2019: Patient reports that he has been taking three times daily.   lidocaine (XYLOCAINE) 5 % ointment Apply 1 application topically as needed.    magnesium oxide (MAG-OX) 400 MG tablet TAKE 400 MG BY MOUTH DAILY.    Menthol-Methyl Salicylate (MUSCLE RUB) 10-15 % CREA Apply 1 application topically 3 (three) times daily as needed for muscle pain.  For muscle pain     metolazone (ZAROXOLYN) 2.5 MG tablet Take 1 tablet (2.5 mg total) by mouth 3 (three) times a week.    metoprolol tartrate (LOPRESSOR) 25 MG tablet Take 1 tablet (25 mg total) by mouth 2 (two) times daily.    Multiple Vitamins-Minerals (MULTI COMPLETE PO) Take 1 tablet by mouth daily.     neomycin-bacitracin-polymyxin (NEOSPORIN) ointment Apply 1 application topically every 12 (twelve) hours as needed for wound care. Reported on 5/39/7673    OneTouch Delica Lancets 41P MISC Test BS QID Dx E11.9    pravastatin (PRAVACHOL) 10 MG tablet Take 1 tablet (10 mg total) by mouth daily.    QUEtiapine (SEROQUEL) 100 MG tablet Take 1 tablet (100 mg total) by mouth at bedtime.    torsemide (DEMADEX) 20 MG tablet Take 4 tablets (80 mg total) by mouth 2 (two) times daily.    On his own, taking Metformin PRN for blood sugars >150    Objective:  Lab Results  Component Value Date   HGBA1C 4.9 11/16/2019   HGBA1C 6.2 (H) 05/15/2019   HGBA1C 5.7 (H) 04/22/2019   Lab Results  Component Value Date   LDLCALC 65 11/16/2019   CREATININE 0.72 (L) 11/16/2019     Patient Care Plan: Fall Risk (Adult)    Problem Identified: Fall Risk   Priority: High  Note:   Current Barriers:   Chronic Disease Management support and education needs related to fall risk related to osteoarthritis, decreased mobility, and morbid obesity  Unable to independently drive himself to appointments outside of local area  Medications can pose a fall risk  Nurse Case [name] Clinical Goal(s):   Over the next 45 days, patient will verbalize understanding of plan for fall prevention  Over the next 45 days, patient will work with [name] to address needs related to fall prevention strategies  Interventions:   1:1 collaboration with [name], [name] Kaufmann, MD regarding development and update of comprehensive plan of care as evidenced by provider attestation and co-signature  Inter-disciplinary  care team collaboration (see longitudinal plan of care)  Chart reviewed including relevant office notes, lab results, and imaging reports  Discussed mobility and ability to perform ADLs  Discussed use of assistive devices: crutches and a boot on right foot  Discussed multiple joint problems due to osteoarthritis  Discussed fall risks and fall precautions  Encouraged patient to reach out to [name] as needed  reviewed upcoming appointment with PCP on 02/12/20  Patient Goals/Self-Care Activities Over the next 45 days, patient will: - keep my cell phone with me always - make an emergency alert plan in case I fall - pick up clutter from the floors - use a nonslip pad with throw rugs, or remove them completely - use a cane or walker or crutches - use a nightlight in the bathroom     Patient  Care Plan: Diabetes Type 2 (Adult)    Problem Identified: Glycemic Management (Diabetes, Type 2)   Priority: Low    Goal: Glycemic Management Optimized   This Visit's Progress: Not on track  Priority: Low  Note:   Current Barriers:   Chronic Disease Management support and education needs related to diabetes  Unable to independently does not drive to appointments outside of local area  Nurse Case [name] Clinical Goal(s):   Over the next 45 days, patient will verbalize understanding of plan for blood sugar management  Over the next 45 days, patient will work with [name] to address needs related to self management of diabetes  Over the next 60 days, patient will talk with PCP about diabetes management  Interventions:   1:1 collaboration with [name], [name] Kaufmann, MD regarding development and update of comprehensive plan of care as evidenced by provider attestation and co-signature  Inter-disciplinary care team collaboration (see longitudinal plan of care)  Chart reviewed including relevant office notes and lab results o Most recent A1C was 4.9  Reviewed and  discussed medications o Patient is taking Metformin PRN blood sugars >150 but it's no longer listed on his med list o Usually takes it about once or twice a month  Encouraged patient to talk with PCP regarding blood sugar management. Metformin works best if taking regularly and he may not need it any longer.   [name] will collaborate with PCP regarding metformin dosing and blood sugar management  Discussed home testing o Testing about once or twice a month o No hypoglycemia but does keep candy with him in case he "feels that it is low"  Encouraged weekly testing and record results  Bring results to appt with PCP  Provided with [name] contact information and encouraged to reach out as needed  Patient Goals/Self-Care Activities Over the next 60 days, patient will:  - check blood sugar at prescribed times - check blood sugar if I feel it is too high or too low - enter blood sugar readings and medication or insulin into daily log - take the blood sugar log to all doctor visits - take the blood sugar meter to all doctor visits     Patient Care Plan: Osteoarthritis (Adult)    Problem Identified: Pain (Osteoarthritis)     Long-Range Goal: Manage Pain and Mobility   This Visit's Progress: Not on track  Priority: High  Note:   Current Barriers:   Chronic Disease Management support and education needs related to osteoarthritis in multiple joints, morbid obesity, hypertension.   Lacks caregiver support.   Portal vein thrombosis  Unable to independently drive long distances  Nurse Case [name] Clinical Goal(s):   Over the next 45 days, patient will verbalize understanding of plan for management of osteoarthritis  Over the next 45 days, patient will work with [name] to address needs related to self-management of OA  Over the next 60 days, patient will work with specialists regarding management of osteoarthritis and treatment options  Interventions:    1:1 collaboration with [name], [name] Kaufmann, MD regarding development and update of comprehensive plan of care as evidenced by provider attestation and co-signature  Inter-disciplinary care team collaboration (see longitudinal plan of care)  Evaluation of current treatment plan related to osteoarthritis and patient's adherence to plan as established by provider.  Chart reviewed including relevant office notes, lab results and imaging reports  Discussed results with patient  Discussed portal vein thrombosis and risks associated  with surgery  Reviewed upcoming appointments and discussed multiple ortho specialists that the patient sees for shoulder, knee, and ankle  Discussed treatment options that have been presented to him. He's considering them o Dr recommended right shoulder and left knee replacement and right ankle reconstruction.   Difficulty with ROM in right shoulder  Discussed mobility and ability to perform ADLs o Currently using crutches and a boot on his right foot  Discussed family/social support o Has help with transportation for appointments outside of local area  [place] will reach out to Dr Marcelino Scot (ankle surgeon) on patient's behalf because he has had difficulty getting in touch with them to discuss treatment plan  Provided with [name] contact information and encouraged to reach out as needed  Encouraged patient to talk with LCSW regarding psychosocial issues  Patient Goals/Self-Care Activities Over the next 45 days, patient will: Patient will self administer medications as prescribed Patient will call pharmacy for medication refills Patient will continue to perform ADL's independently Patient will call provider office for new concerns or questions Continue to use assistive devices Call PCP or ortho specialist with any new or worsening symptoms  develop a personal pain management plan keep track of prescription refills plan exercise or  activity when pain is best controlled prioritize tasks for the day start a pain diary stay active track times pain is worst and when it is best track what makes the pain worse and what makes it better use ice or heat for pain relief work slower and less intense when having pain     Follow Up Plan: Telephone follow up appointment with care management team member scheduled for: 01/19/20 with [name] The patient has been provided with contact information for the care management team and has been advised to call with any health related questions or concerns.  The patient will call [phone] as advised to report any blood sugar readings outside of provider recommended range.  Follow up with Dr [name] on 02/12/20  Follow up with ortho specialists regarding treatment options for OA   [name], BSN, [name]-BC [place] [facility] [place] Management Direct Dial: [phone]

## 2019-12-30 NOTE — Patient Instructions (Addendum)
Visit Information  Goals Addressed            This Visit's Progress   . Manage Pain-Osteoarthritis       Timeframe:  Long-Range Goal Priority:  Medium Start Date:                             Expected End Date:                       Follow Up Date 01/19/20    Patient will call pharmacy for medication refills Patient will continue to perform ADL's independently Patient will call provider office for new concerns or questions Continue to use assistive devices Call PCP or ortho specialist with any new or worsening symptoms  develop a personal pain management plan keep track of prescription refills plan exercise or activity when pain is best controlled prioritize tasks for the day start a pain diary stay active track times pain is worst and when it is best track what makes the pain worse and what makes it better use ice or heat for pain relief work slower and less intense when having pain   Why is this important?    Day-to-day life can be hard when you have joint pain.   Pain medicine is just one piece of the treatment puzzle. There are many things you can do to manage pain and stay strong.    Lifestyle changes like stopping smoking and eating foods with Vitamin D and calcium keeps your bones and muscles healthy. Your joints are better when supported by strong muscles.   You can try these action steps to help you manage your pain.     Notes:     Marland Kitchen Monitor and Manage My Blood Sugar-Diabetes Type 2       Timeframe:  Long-Range Goal Priority:  Low Start Date:                             Expected End Date:                       Follow Up Date 01/19/20    - check blood sugar at prescribed times - check blood sugar if I feel it is too high or too low - enter blood sugar readings and medication or insulin into daily log - take the blood sugar log to all doctor visits - take the blood sugar meter to all doctor visits    Why is this important?    Checking your blood sugar  at home helps to keep it from getting very high or very low.   Writing the results in a diary or log helps the doctor know how to care for you.   Your blood sugar log should have the time, date and the results.   Also, write down the amount of insulin or other medicine that you take.   Other information, like what you ate, exercise done and how you were feeling, will also be helpful.     Notes:     . Prevent Falls and Injury       Follow Up Date 01/19/2020    - keep my cell phone with me always - make an emergency alert plan in case I fall - pick up clutter from the floors - use a nonslip pad with throw rugs, or remove them  completely - use a cane or walker or crutches - use a nightlight in the bathroom    Why is this important?    Most falls happen when it is hard for you to walk safely. Your balance may be off because of an illness. You may have pain in your knees, hip or other joints.   You may be overly tired or taking medicines that make you sleepy. You may not be able to see or hear clearly.   Falls can lead to broken bones, bruises or other injuries.   There are things you can do to help prevent falling.     Notes:        Mr. Eckert was given information about Chronic Care Management services including:  1. CCM service includes personalized support from designated clinical staff supervised by his physician, including individualized plan of care and coordination with other care providers 2. 24/7 contact phone numbers for assistance for urgent and routine care needs. 3. Service will only be billed when office clinical staff spend 20 minutes or more in a month to coordinate care. 4. Only one practitioner may furnish and bill the service in a calendar month. 5. The patient may stop CCM services at any time (effective at the end of the month) by phone call to the office staff. 6. The patient will be responsible for cost sharing (co-pay) of up to 20% of the service fee  (after annual deductible is met).  Patient agreed to services and verbal consent obtained.   The patient verbalized understanding of instructions, educational materials, and care plan provided today and declined offer to receive copy of patient instructions, educational materials, and care plan.   Follow Up Plan: Telephone follow up appointment with care management team member scheduled for: 01/19/20 with RN Care Manager The patient has been provided with contact information for the care management team and has been advised to call with any health related questions or concerns.  The patient will call [phone] as advised to report any blood sugar readings outside of provider recommended range.  Follow up with Dr Dettinger on 02/12/20  Follow up with ortho specialists regarding treatment options for OA   Chong Sicilian, BSN, RN-BC [place] [facility] [place] Management Direct Dial: [phone]

## 2019-12-31 ENCOUNTER — Ambulatory Visit: Payer: Medicare HMO | Admitting: Family Medicine

## 2020-01-07 ENCOUNTER — Other Ambulatory Visit: Payer: Self-pay | Admitting: Family Medicine

## 2020-01-12 DIAGNOSIS — J449 Chronic obstructive pulmonary disease, unspecified: Secondary | ICD-10-CM | POA: Diagnosis not present

## 2020-01-12 DIAGNOSIS — G4733 Obstructive sleep apnea (adult) (pediatric): Secondary | ICD-10-CM | POA: Diagnosis not present

## 2020-01-15 ENCOUNTER — Other Ambulatory Visit: Payer: Self-pay | Admitting: Family Medicine

## 2020-01-15 DIAGNOSIS — F410 Panic disorder [episodic paroxysmal anxiety] without agoraphobia: Secondary | ICD-10-CM

## 2020-01-19 ENCOUNTER — Telehealth: Payer: Medicare HMO

## 2020-01-20 ENCOUNTER — Telehealth: Payer: Self-pay

## 2020-01-20 NOTE — Telephone Encounter (Signed)
Patient aware he would need lab work.

## 2020-01-28 DIAGNOSIS — G4733 Obstructive sleep apnea (adult) (pediatric): Secondary | ICD-10-CM | POA: Diagnosis not present

## 2020-01-28 DIAGNOSIS — J449 Chronic obstructive pulmonary disease, unspecified: Secondary | ICD-10-CM | POA: Diagnosis not present

## 2020-02-02 ENCOUNTER — Telehealth: Payer: Medicare HMO

## 2020-02-04 ENCOUNTER — Ambulatory Visit: Payer: Medicare HMO | Admitting: Family Medicine

## 2020-02-06 DIAGNOSIS — R0600 Dyspnea, unspecified: Secondary | ICD-10-CM | POA: Diagnosis not present

## 2020-02-06 DIAGNOSIS — R0602 Shortness of breath: Secondary | ICD-10-CM | POA: Diagnosis not present

## 2020-02-06 DIAGNOSIS — J449 Chronic obstructive pulmonary disease, unspecified: Secondary | ICD-10-CM | POA: Diagnosis not present

## 2020-02-06 DIAGNOSIS — I509 Heart failure, unspecified: Secondary | ICD-10-CM | POA: Diagnosis not present

## 2020-02-06 DIAGNOSIS — R9431 Abnormal electrocardiogram [ECG] [EKG]: Secondary | ICD-10-CM | POA: Diagnosis not present

## 2020-02-06 DIAGNOSIS — E119 Type 2 diabetes mellitus without complications: Secondary | ICD-10-CM | POA: Diagnosis not present

## 2020-02-06 DIAGNOSIS — R000 Tachycardia, unspecified: Secondary | ICD-10-CM | POA: Diagnosis not present

## 2020-02-09 DIAGNOSIS — G5712 Meralgia paresthetica, left lower limb: Secondary | ICD-10-CM | POA: Diagnosis not present

## 2020-02-09 DIAGNOSIS — R69 Illness, unspecified: Secondary | ICD-10-CM | POA: Diagnosis not present

## 2020-02-09 DIAGNOSIS — G894 Chronic pain syndrome: Secondary | ICD-10-CM | POA: Diagnosis not present

## 2020-02-09 DIAGNOSIS — Z79891 Long term (current) use of opiate analgesic: Secondary | ICD-10-CM | POA: Diagnosis not present

## 2020-02-09 DIAGNOSIS — G4733 Obstructive sleep apnea (adult) (pediatric): Secondary | ICD-10-CM | POA: Diagnosis not present

## 2020-02-09 DIAGNOSIS — M109 Gout, unspecified: Secondary | ICD-10-CM | POA: Diagnosis not present

## 2020-02-09 DIAGNOSIS — M130 Polyarthritis, unspecified: Secondary | ICD-10-CM | POA: Diagnosis not present

## 2020-02-11 ENCOUNTER — Telehealth: Payer: Self-pay | Admitting: *Deleted

## 2020-02-11 ENCOUNTER — Telehealth: Payer: Medicare HMO | Admitting: *Deleted

## 2020-02-11 NOTE — Telephone Encounter (Signed)
  Chronic Care Management   Outreach Note  02/11/2020 Name: [name] MRN: [id number] DOB: [birth year]  Referred by: Dettinger, Fransisca Kaufmann, MD Reason for referral : Chronic Care Management (Rn follow up)   An unsuccessful follow-up Telephone Visit was attempted today. The patient was referred to the case management team for assistance with care management and care coordination.   Clinical Goals: . Over the next 30 days, patient will be contacted by a Care Guide to reschedule their CCM Visit  Interventions and Plan . Chart reviewed in preparation for telephone visit . Collaboration with other care team members as needed . A HIPAA compliant phone message was left for the patient providing contact information and requesting a return call.  . Request sent to care guides to reach out and reschedule patient's telephone visit   Chong Sicilian, BSN, RN-BC [place] [facility] [place] Management Direct Dial: [phone]

## 2020-02-12 ENCOUNTER — Ambulatory Visit: Payer: Medicare HMO | Admitting: Family Medicine

## 2020-02-12 ENCOUNTER — Telehealth: Payer: Self-pay | Admitting: *Deleted

## 2020-02-12 DIAGNOSIS — J449 Chronic obstructive pulmonary disease, unspecified: Secondary | ICD-10-CM | POA: Diagnosis not present

## 2020-02-12 DIAGNOSIS — G4733 Obstructive sleep apnea (adult) (pediatric): Secondary | ICD-10-CM | POA: Diagnosis not present

## 2020-02-12 NOTE — Chronic Care Management (AMB) (Signed)
  Care Management   Note  02/12/2020 Name: [name] MRN: [id number] DOB: 02-13-1965  [name] is a 55 y.o. year old male who is a primary care patient of Dettinger, Fransisca Kaufmann, MD and is actively engaged with the care management team. I reached out to [place] by phone today to assist with re-scheduling a follow up visit with the RN Case Manager.  Follow up plan: Unsuccessful telephone outreach attempt made. A HIPAA compliant phone message was left for the patient providing contact information and requesting a return call. The care management team will reach out to the patient again over the next 7 days. If patient returns call to provider office, please advise to call [place] at [phone].  [place] Management

## 2020-02-12 NOTE — Chronic Care Management (AMB) (Signed)
  Care Management   Note  02/12/2020 Name: GROVER ROBINSON MRN: [id number] DOB: 1965/01/15  Lillard Anes Witthuhn is a 55 y.o. year old male who is a primary care patient of Dettinger, Fransisca Kaufmann, MD and is actively engaged with the care management team. I reached out to [place] by phone today to assist with re-scheduling a follow up visit with the RN Case Manager  Follow up plan: Telephone appointment with care management team member scheduled for:02/25/2020  [facility] Management

## 2020-02-12 NOTE — Telephone Encounter (Signed)
Rescheduled for 02/25/2020.  [facility] Management

## 2020-02-12 NOTE — Telephone Encounter (Signed)
Please r/s

## 2020-02-13 ENCOUNTER — Other Ambulatory Visit (INDEPENDENT_AMBULATORY_CARE_PROVIDER_SITE_OTHER): Payer: Self-pay | Admitting: Family Medicine

## 2020-02-22 ENCOUNTER — Encounter: Payer: Self-pay | Admitting: Family Medicine

## 2020-02-22 ENCOUNTER — Telehealth: Payer: Self-pay

## 2020-02-22 ENCOUNTER — Other Ambulatory Visit: Payer: Self-pay

## 2020-02-22 ENCOUNTER — Ambulatory Visit (INDEPENDENT_AMBULATORY_CARE_PROVIDER_SITE_OTHER): Payer: Medicare HMO | Admitting: Family Medicine

## 2020-02-22 VITALS — BP 143/80 | HR 107 | Ht 72.0 in | Wt 390.0 lb

## 2020-02-22 DIAGNOSIS — I5043 Acute on chronic combined systolic (congestive) and diastolic (congestive) heart failure: Secondary | ICD-10-CM

## 2020-02-22 DIAGNOSIS — E785 Hyperlipidemia, unspecified: Secondary | ICD-10-CM | POA: Diagnosis not present

## 2020-02-22 DIAGNOSIS — I81 Portal vein thrombosis: Secondary | ICD-10-CM

## 2020-02-22 DIAGNOSIS — Z125 Encounter for screening for malignant neoplasm of prostate: Secondary | ICD-10-CM

## 2020-02-22 DIAGNOSIS — I10 Essential (primary) hypertension: Secondary | ICD-10-CM

## 2020-02-22 DIAGNOSIS — G4733 Obstructive sleep apnea (adult) (pediatric): Secondary | ICD-10-CM | POA: Diagnosis not present

## 2020-02-22 DIAGNOSIS — E782 Mixed hyperlipidemia: Secondary | ICD-10-CM

## 2020-02-22 DIAGNOSIS — Z23 Encounter for immunization: Secondary | ICD-10-CM | POA: Diagnosis not present

## 2020-02-22 DIAGNOSIS — J449 Chronic obstructive pulmonary disease, unspecified: Secondary | ICD-10-CM | POA: Diagnosis not present

## 2020-02-22 NOTE — Patient Instructions (Signed)
Patient believes he is taking 3 to 4 pills of the torsemide daily, will have him increase to 8 tablets of the torsemide daily for the next 2 weeks then recheck a basic metabolic panel in 2 weeks with [place] or with cardiology.

## 2020-02-22 NOTE — Progress Notes (Signed)
BP (!) 143/80   Pulse (!) 107   Ht 6' (1.829 m)   Wt (!) 390 lb (176.9 kg)   SpO2 98%   BMI 52.89 kg/m    Subjective:   Patient ID: [name], male    DOB: Jun 08, 1965, 55 y.o.   MRN: [id number]  HPI: [name] is a 55 y.o. male presenting on 02/22/2020 for Medical Management of Chronic Issues   HPI  CHF Patient comes in complaining of his CHF and fluid overload. He feels like he is gained 80 pounds in the past few weeks. Last time we had an weight was October 2021 and he was 304 pounds and now is 390 pounds. He feels like that is all fluid in his legs and his abdomen and in his groin. He feels like he is just not able to keep it off as much as he could before. He denies any shortness of breath increased or orthopnea. Patient says his breathing is what it is normally. Patient has cardiology but has not seen them in some time.  Hyperlipidemia Patient is coming in for recheck of his hyperlipidemia. The patient is currently taking pravastatin and Zetia. They deny any issues with myalgias or history of liver damage from it. They deny any focal numbness or weakness or chest pain.   Hypertension Patient is currently on torsemide and metoprolol and metolazone, and their blood pressure today is 143/80. Patient denies any lightheadedness or dizziness. Patient denies headaches, blurred vision, chest pains, shortness of breath, or weakness. Denies any side effects from medication and is content with current medication.   Relevant past medical, surgical, family and social history reviewed and updated as indicated. Interim medical history since our last visit reviewed. Allergies and medications reviewed and updated.  Review of Systems  Constitutional: Negative for chills and fever.  Eyes: Negative for visual disturbance.  Respiratory: Negative for cough, shortness of breath and wheezing.   Cardiovascular: Positive for leg swelling. Negative for chest pain.  Gastrointestinal:  Positive for abdominal distention. Negative for abdominal pain, constipation, diarrhea, nausea and vomiting.  Musculoskeletal: Negative for back pain and gait problem.  Skin: Negative for rash.  Neurological: Negative for dizziness, weakness and light-headedness.  All other systems reviewed and are negative.   Per HPI unless specifically indicated above   Allergies as of 02/22/2020      Reactions   Allopurinol Other (See Comments)   Joint pain worse       Medication List       Accurate as of February 22, 2020 11:38 AM. If you have any questions, ask your nurse or doctor.        aspirin EC 81 MG tablet Take 81 mg by mouth as needed (CHEST PAIN).   baclofen 10 MG tablet Commonly known as: LIORESAL TAKE 1 TABLET BY MOUTH THREE TIMES A DAY   betamethasone dipropionate 0.05 % cream APPLY TO AFFECTED AREA TWICE A DAY What changed:   how much to take  how to take this  when to take this  reasons to take this  additional instructions   Boostrix 5-2.5-18.5 LF-MCG/0.5 injection Generic drug: Tdap   buprenorphine 2 MG Subl SL tablet Commonly known as: SUBUTEX 2 mg. Place 2 tablets under tongue twice daily   buprenorphine-naloxone 8-2 mg Subl SL tablet Commonly known as: SUBOXONE Place 0.5 tablets under the tongue 2 (two) times daily.   busPIRone 10 MG tablet Commonly known as: BUSPAR TAKE 1 TABLET BY MOUTH THREE  TIMES A DAY   colchicine 0.6 MG tablet Take 0.6 mg by mouth 2 (two) times daily as needed.   diclofenac Sodium 1 % Gel Commonly known as: VOLTAREN Apply 4 g topically 4 (four) times daily.   diltiazem 300 MG 24 hr capsule Commonly known as: TIAZAC diltiazem CD 300 mg capsule,extended release 24 hr   diltiazem 300 MG 24 hr capsule Commonly known as: Cartia XT TAKE ONE CAPSULE BY MOUTH EVERY DAY What changed:   how much to take  how to take this  when to take this   diltiazem 90 MG 12 hr capsule Commonly known as: CARDIZEM SR Take 1 capsule  (90 mg total) by mouth daily as needed. palpitations   DULoxetine 60 MG capsule Commonly known as: CYMBALTA Take 60 mg by mouth at bedtime.   esomeprazole 40 MG capsule Commonly known as: NEXIUM Take 1 capsule (40 mg total) by mouth daily.   ezetimibe 10 MG tablet Commonly known as: ZETIA Take 1 tablet (10 mg total) by mouth daily.   fluticasone 50 MCG/ACT nasal spray Commonly known as: FLONASE USE 2 SPRAYS IN EACH NOSTRIL EVERY DAY AS NEEDED   folic acid 1 MG tablet Commonly known as: FOLVITE TAKE ONE TABLET BY MOUTH ONCE DAILY - EMERGENCY REFILL FAXED DR. What changed: See the new instructions.   [facility] Folic Acid 250 MCG tablet Generic drug: folic acid TAKE 1 TABLET BY MOUTH EVERY DAY What changed: Another medication with the same name was changed. Make sure you understand how and when to take each.   gabapentin 800 MG tablet Commonly known as: NEURONTIN Take 1 tablet (800 mg total) by mouth 3 (three) times daily.   HYDROmorphone 4 MG tablet Commonly known as: DILAUDID Take 4 mg by mouth every 4 (four) hours as needed for moderate pain or severe pain.   indomethacin 50 MG capsule Commonly known as: INDOCIN Take 50 mg by mouth 3 (three) times daily as needed.   ipratropium 0.02 % nebulizer solution Commonly known as: ATROVENT Take 0.5 mg by nebulization every 6 (six) hours as needed for wheezing or shortness of breath.   Klor-Con M20 20 MEQ tablet Generic drug: potassium chloride SA TAKE 2 TABLETS BY MOUTH 2 TIMES DAILY.   lactulose 10 GM/15ML solution Commonly known as: CHRONULAC TAKE 2 TABLESPOONS BY MOUTH TWICE DAILY   lidocaine 5 % ointment Commonly known as: XYLOCAINE Apply 1 application topically as needed.   magnesium oxide 400 MG tablet Commonly known as: MAG-OX TAKE 400 MG BY MOUTH DAILY.   metolazone 2.5 MG tablet Commonly known as: ZAROXOLYN Take 1 tablet (2.5 mg total) by mouth 3 (three) times a week.   metoprolol tartrate 25 MG  tablet Commonly known as: LOPRESSOR Take 1 tablet (25 mg total) by mouth 2 (two) times daily.   MULTI COMPLETE PO Take 1 tablet by mouth daily.   Muscle Rub 10-15 % Crea Apply 1 application topically 3 (three) times daily as needed for muscle pain. For muscle pain   neomycin-bacitracin-polymyxin ointment Commonly known as: NEOSPORIN Apply 1 application topically every 12 (twelve) hours as needed for wound care. Reported on 5/39/7673   OneTouch Delica Lancets 41P Misc Test BS QID Dx E11.9   pravastatin 10 MG tablet Commonly known as: PRAVACHOL Take 1 tablet (10 mg total) by mouth daily.   QUEtiapine 100 MG tablet Commonly known as: SEROquel Take 1 tablet (100 mg total) by mouth at bedtime.   torsemide 20 MG tablet Commonly known as:  DEMADEX Take 4 tablets (80 mg total) by mouth 2 (two) times daily.        Objective:   BP (!) 143/80   Pulse (!) 107   Ht 6' (1.829 m)   Wt (!) 390 lb (176.9 kg)   SpO2 98%   BMI 52.89 kg/m   Wt Readings from Last 3 Encounters:  02/22/20 (!) 390 lb (176.9 kg)  10/14/19 (!) 305 lb (138.3 kg)  08/28/19 (!) 324 lb (147 kg)    Physical Exam Vitals and nursing note reviewed.  Constitutional:      General: He is not in acute distress.    Appearance: He is well-developed and well-nourished. He is not diaphoretic.  Eyes:     General: No scleral icterus.    Extraocular Movements: EOM normal.     Conjunctiva/sclera: Conjunctivae normal.  Neck:     Thyroid: No thyromegaly.  Cardiovascular:     Rate and Rhythm: Normal rate and regular rhythm.     Pulses: Intact distal pulses.     Heart sounds: Normal heart sounds. No murmur heard.   Pulmonary:     Effort: Pulmonary effort is normal. No respiratory distress.     Breath sounds: Normal breath sounds. No wheezing.  Musculoskeletal:        General: Swelling (2+ peripheral edema, very tight) present. No edema. Normal range of motion.     Cervical back: Neck supple.  Lymphadenopathy:      Cervical: No cervical adenopathy.  Skin:    General: Skin is warm and dry.     Findings: No rash.  Neurological:     Mental Status: He is alert and oriented to person, place, and time.     Coordination: Coordination normal.  Psychiatric:        Mood and Affect: Mood and affect normal.        Behavior: Behavior normal.       Assessment & Plan:   Problem List Items Addressed This Visit      Cardiovascular and Mediastinum   Hypertension, essential   Relevant Orders   CBC with Differential/Platelet   CHF (congestive heart failure) (HCC) - Primary   Relevant Orders   CBC with Differential/Platelet   BMP8+EGFR     Other   Mixed hyperlipidemia   Relevant Orders   CMP14+EGFR   Dyslipidemia    Other Visit Diagnoses    Prostate cancer screening       Relevant Orders   PSA, total and free   Portal vein thrombosis       Relevant Orders   US Abdomen Complete      Patient is almost up 80 pounds and he thinks it is fluid and has had swelling, he thinks is just over the past few weeks. He denies any shortness of breath. He already has orthopnea but has not noted it being worse.  Patient believes he is taking 3 to 4 pills of the torsemide daily, will have him increase to 8 tablets of the torsemide daily for the next 2 weeks then recheck a basic metabolic panel in 2 weeks with [place] or with cardiology.  Follow up plan: Return in about 3 months (around 05/21/2020), or if symptoms worsen or fail to improve, for CHF and hypertension recheck.  Counseling provided for all of the vaccine components Orders Placed This Encounter  Procedures  . US Abdomen Complete  . CBC with Differential/Platelet  . CMP14+EGFR  . PSA, total and free  . BMP8+EGFR  Caryl Pina, MD [facility] 02/22/2020, 11:38 AM

## 2020-02-22 NOTE — Telephone Encounter (Signed)
A lot of people use tea tree oil on the toenails and painted on every evening and it can help with this.

## 2020-02-22 NOTE — Telephone Encounter (Signed)
Pt informed. He also wants to know if he should take a potassium pill each time he takes a fluid pill.  Per Dr. Warrick Parisian pt may take 10-45m of potassium with each fluid pill.

## 2020-02-23 LAB — CMP14+EGFR
ALT: 15 IU/L (ref 0–44)
AST: 42 IU/L — ABNORMAL HIGH (ref 0–40)
Albumin/Globulin Ratio: 1.1 — ABNORMAL LOW (ref 1.2–2.2)
Albumin: 3.3 g/dL — ABNORMAL LOW (ref 3.8–4.9)
Alkaline Phosphatase: 141 IU/L — ABNORMAL HIGH (ref 44–121)
BUN/Creatinine Ratio: 13 (ref 9–20)
BUN: 14 mg/dL (ref 6–24)
Bilirubin Total: 1.2 mg/dL (ref 0.0–1.2)
CO2: 24 mmol/L (ref 20–29)
Calcium: 8.5 mg/dL — ABNORMAL LOW (ref 8.7–10.2)
Chloride: 95 mmol/L — ABNORMAL LOW (ref 96–106)
Creatinine, Ser: 1.06 mg/dL (ref 0.76–1.27)
GFR calc Af Amer: 90 mL/min/{1.73_m2} (ref >59)
GFR calc non Af Amer: 78 mL/min/{1.73_m2} (ref >59)
Globulin, Total: 2.9 g/dL (ref 1.5–4.5)
Glucose: 154 mg/dL — ABNORMAL HIGH (ref 65–99)
Potassium: 3.3 mmol/L — ABNORMAL LOW (ref 3.5–5.2)
Sodium: 136 mmol/L (ref 134–144)
Total Protein: 6.2 g/dL (ref 6.0–8.5)

## 2020-02-23 LAB — CBC WITH DIFFERENTIAL/PLATELET
Basophils Absolute: 0 10*3/uL (ref 0.0–0.2)
Basos: 2 %
EOS (ABSOLUTE): 0.1 10*3/uL (ref 0.0–0.4)
Eos: 4 %
Hematocrit: 33.9 % — ABNORMAL LOW (ref 37.5–51.0)
Hemoglobin: 11.4 g/dL — ABNORMAL LOW (ref 13.0–17.7)
Immature Grans (Abs): 0 10*3/uL (ref 0.0–0.1)
Immature Granulocytes: 1 %
Lymphocytes Absolute: 0.4 10*3/uL — ABNORMAL LOW (ref 0.7–3.1)
Lymphs: 21 %
MCH: 26.7 pg (ref 26.6–33.0)
MCHC: 33.6 g/dL (ref 31.5–35.7)
MCV: 79 fL (ref 79–97)
Monocytes Absolute: 0.2 10*3/uL (ref 0.1–0.9)
Monocytes: 11 %
Neutrophils Absolute: 1.1 10*3/uL — ABNORMAL LOW (ref 1.4–7.0)
Neutrophils: 61 %
Platelets: 38 10*3/uL — CL (ref 150–450)
RBC: 4.27 x10E6/uL (ref 4.14–5.80)
RDW: 14 % (ref 11.6–15.4)
WBC: 1.8 10*3/uL — CL (ref 3.4–10.8)

## 2020-02-23 LAB — PSA, TOTAL AND FREE
PSA, Free Pct: 30 %
PSA, Free: 0.03 ng/mL
Prostate Specific Ag, Serum: 0.1 ng/mL (ref 0.0–4.0)

## 2020-02-24 ENCOUNTER — Other Ambulatory Visit (HOSPITAL_COMMUNITY): Payer: Medicare HMO

## 2020-02-25 ENCOUNTER — Telehealth: Payer: Self-pay

## 2020-02-25 ENCOUNTER — Telehealth: Payer: Medicare HMO | Admitting: *Deleted

## 2020-02-25 ENCOUNTER — Telehealth: Payer: Self-pay | Admitting: *Deleted

## 2020-02-25 DIAGNOSIS — R9431 Abnormal electrocardiogram [ECG] [EKG]: Secondary | ICD-10-CM | POA: Diagnosis not present

## 2020-02-25 DIAGNOSIS — E119 Type 2 diabetes mellitus without complications: Secondary | ICD-10-CM | POA: Diagnosis not present

## 2020-02-25 DIAGNOSIS — E876 Hypokalemia: Secondary | ICD-10-CM | POA: Diagnosis not present

## 2020-02-25 DIAGNOSIS — K7460 Unspecified cirrhosis of liver: Secondary | ICD-10-CM | POA: Diagnosis not present

## 2020-02-25 DIAGNOSIS — R140 Abdominal distension (gaseous): Secondary | ICD-10-CM | POA: Diagnosis not present

## 2020-02-25 DIAGNOSIS — R630 Anorexia: Secondary | ICD-10-CM | POA: Diagnosis not present

## 2020-02-25 DIAGNOSIS — J449 Chronic obstructive pulmonary disease, unspecified: Secondary | ICD-10-CM | POA: Diagnosis not present

## 2020-02-25 DIAGNOSIS — J811 Chronic pulmonary edema: Secondary | ICD-10-CM | POA: Diagnosis not present

## 2020-02-25 DIAGNOSIS — R600 Localized edema: Secondary | ICD-10-CM | POA: Diagnosis not present

## 2020-02-25 DIAGNOSIS — Z8719 Personal history of other diseases of the digestive system: Secondary | ICD-10-CM | POA: Diagnosis not present

## 2020-02-25 DIAGNOSIS — K439 Ventral hernia without obstruction or gangrene: Secondary | ICD-10-CM | POA: Diagnosis not present

## 2020-02-25 DIAGNOSIS — N5089 Other specified disorders of the male genital organs: Secondary | ICD-10-CM | POA: Diagnosis not present

## 2020-02-25 NOTE — Telephone Encounter (Signed)
Pt wants to let Dettinger know that the fluid pill is not working makes him feel bad. He is not eating like he should. He is still swollen. Please call back

## 2020-02-25 NOTE — Telephone Encounter (Signed)
Patient aware.

## 2020-02-25 NOTE — Telephone Encounter (Signed)
Patient had appointment with you on 2/14

## 2020-02-25 NOTE — Telephone Encounter (Signed)
If the increase fluid pill is still not working then he is likely going to have to go to the emergency department for IV diuresis, unfortunately I do not have any other answers outpatient except for the increase in it for 7 days like we said.

## 2020-02-26 NOTE — Telephone Encounter (Signed)
  Chronic Care Management   Outreach Note  02/25/2020 Name: CEZAR MISIASZEK MRN: [id number] DOB: January 03, 1966  Referred by: Dettinger, Fransisca Kaufmann, MD Reason for referral : Chronic Care Management (Follow up)   An unsuccessful follow-up Telephone Visit was attempted today. The patient was referred to the case management team for assistance with care management and care coordination. Patient was sent to the ED by PCP and was likely there when I tried to call.   Clinical Goals: . Over the next 30 days, patient will be contacted by a Care Guide to reschedule their CCM Visit  Interventions and Plan . Chart reviewed in preparation for telephone visit . Collaboration with other care team members as needed . A HIPAA compliant phone message was left for the patient providing contact information and requesting a return call.  . Request sent to care guides to reach out and reschedule patient's telephone visit   Chong Sicilian, BSN, RN-BC [facility] Direct Dial: [phone]

## 2020-03-02 ENCOUNTER — Ambulatory Visit (HOSPITAL_COMMUNITY): Payer: Medicare HMO | Admitting: Hematology

## 2020-03-02 ENCOUNTER — Other Ambulatory Visit: Payer: Self-pay | Admitting: Family Medicine

## 2020-03-02 ENCOUNTER — Ambulatory Visit (HOSPITAL_COMMUNITY)
Admission: RE | Admit: 2020-03-02 | Discharge: 2020-03-02 | Disposition: A | Discharge status: Home / Self Care | Payer: Medicare HMO | Source: Ambulatory Visit | Attending: Family Medicine | Admitting: Family Medicine

## 2020-03-02 DIAGNOSIS — K7030 Alcoholic cirrhosis of liver without ascites: Secondary | ICD-10-CM

## 2020-03-02 DIAGNOSIS — K7460 Unspecified cirrhosis of liver: Secondary | ICD-10-CM | POA: Diagnosis not present

## 2020-03-02 DIAGNOSIS — R161 Splenomegaly, not elsewhere classified: Secondary | ICD-10-CM | POA: Diagnosis not present

## 2020-03-02 DIAGNOSIS — K219 Gastro-esophageal reflux disease without esophagitis: Secondary | ICD-10-CM

## 2020-03-02 DIAGNOSIS — I81 Portal vein thrombosis: Secondary | ICD-10-CM | POA: Diagnosis not present

## 2020-03-02 DIAGNOSIS — F339 Major depressive disorder, recurrent, unspecified: Secondary | ICD-10-CM

## 2020-03-02 MED ORDER — POTASSIUM CHLORIDE 20 MEQ/15ML (10%) PO SOLN: 20.0000 meq | Freq: Two times a day (BID) | ORAL | 3 refills | Status: DC

## 2020-03-02 NOTE — Progress Notes (Signed)
I sent look good for the patient but to warn him that it does not taste good, he can try it if not go back to the tablets

## 2020-03-02 NOTE — Progress Notes (Signed)
Pt called and aware by detailed VM

## 2020-03-07 ENCOUNTER — Ambulatory Visit (INDEPENDENT_AMBULATORY_CARE_PROVIDER_SITE_OTHER): Payer: Medicare HMO | Admitting: *Deleted

## 2020-03-07 DIAGNOSIS — Z0000 Encounter for general adult medical examination without abnormal findings: Secondary | ICD-10-CM

## 2020-03-07 NOTE — Patient Instructions (Signed)
[place] Maintenance Summary and Written Plan of Care  [name] ,  Thank you for allowing me to perform your Medicare Annual Wellness Visit and for your ongoing commitment to your health.   Health Maintenance & Immunization History Health Maintenance  Topic Date Due  . FOOT EXAM  07/18/2017  . URINE MICROALBUMIN  07/18/2017  . COVID-19 Vaccine (3 - Moderna risk 4-dose series) 09/24/2019  . HEMOGLOBIN A1C  05/15/2020  . OPHTHALMOLOGY EXAM  06/16/2020  . COLONOSCOPY (Pts 45-14yr Insurance coverage will need to be confirmed)  05/07/2021  . TETANUS/TDAP  08/09/2029  . INFLUENZA VACCINE  Completed  . PNEUMOCOCCAL POLYSACCHARIDE VACCINE AGE 59-64 HIGH RISK  Completed  . Hepatitis C Screening  Completed  . HIV Screening  Completed   Immunization History  Administered Date(s) Administered  . Influenza Split 11/12/2007, 10/16/2013, 04/08/2015  . Influenza Whole 10/11/2008, 01/09/2010, 11/09/2011  . Influenza,inj,Quad PF,6+ Mos 09/10/2012, 10/17/2015, 09/24/2018, 02/22/2020  . Influenza-Unspecified 10/17/2011, 10/17/2015, 12/29/2016  . Moderna Sars-Covid-2 Vaccination 07/27/2019, 08/27/2019  . Pneumococcal Conjugate-13 05/10/2017  . Pneumococcal Polysaccharide-23 09/09/2006, 09/08/2008  . Td 01/09/2008  . Tdap 08/10/2019    These are the patient goals that we discussed: Goals Addressed            This Visit's Progress   . AWV       03/07/2020 AWV Goal: Exercise for General Health   Patient will verbalize understanding of the benefits of increased physical activity:  Exercising regularly is important. It will improve your overall fitness, flexibility, and endurance.  Regular exercise also will improve your overall health. It can help you control your weight, reduce stress, and improve your bone density.  Over the next year, patient will increase physical activity as tolerated with a goal of at least 150 minutes of moderate physical activity  per week.   You can tell that you are exercising at a moderate intensity if your heart starts beating faster and you start breathing faster but can still hold a conversation.  Moderate-intensity exercise ideas include:  Walking 1 mile (1.6 km) in about 15 minutes  Biking  Hiking  Golfing  Dancing  Water aerobics  Patient will verbalize understanding of everyday activities that increase physical activity by providing examples like the following: ? Yard work, such as: ? Pushing a lConservation officer, nature? Raking and bagging leaves ? Washing your car ? Pushing a stroller ? Shoveling snow ? Gardening ? Washing windows or floors  Patient will be able to explain general safety guidelines for exercising:   Before you start a new exercise program, talk with your health care provider.  Do not exercise so much that you hurt yourself, feel dizzy, or get very short of breath.  Wear comfortable clothes and wear shoes with good support.  Drink plenty of water while you exercise to prevent dehydration or heat stroke.  Work out until your breathing and your heartbeat get faster.         This is a list of Health Maintenance Items that are overdue or due now: Health Maintenance Due  Topic Date Due  . FOOT EXAM  07/18/2017  . URINE MICROALBUMIN  07/18/2017  . COVID-19 Vaccine (3 - Moderna risk 4-dose series) 09/24/2019     Orders/Referrals Placed Today: No orders of the defined types were placed in this encounter.  (Contact our referral department at [phone] you have not spoken with someone about your referral appointment within the next 5 days)  Follow-up Plan . Follow-up with Dettinger, Fransisca Kaufmann, MD as planned . Keep your yearly eye exam appointment

## 2020-03-07 NOTE — Progress Notes (Signed)
MEDICARE ANNUAL WELLNESS VISIT  03/07/2020  Telephone Visit Disclaimer This Medicare AWV was conducted by telephone due to national recommendations for restrictions regarding the COVID-19 Pandemic (e.g. social distancing).  I verified, using two identifiers, that I am speaking with [name] or their authorized healthcare agent. I discussed the limitations, risks, security, and privacy concerns of performing an evaluation and management service by telephone and the potential availability of an in-person appointment in the future. The patient expressed understanding and agreed to proceed.  Location of Patient: Home Location of Provider (nurse):  [facility]  Subjective:   [name] is a 55 y.o. male patient of Dettinger, Fransisca Kaufmann, MD who had a Medicare Annual Wellness Visit today via telephone. [name] is Disabled and lives with his step son. he has 1 child. he reports that he is socially active and does interact with friends/family regularly. he is not physically active and enjoys fishing, hunting, camping, and watching movies.  Patient Care Team: Dettinger, Fransisca Kaufmann, MD as PCP - General (Family Medicine) Dettinger, Fransisca Kaufmann, MD as Consulting Physician (Family Medicine) Ilean China, RN as Case Manager  Advanced Directives 03/07/2020 10/14/2019 08/03/2019 07/31/2019 07/24/2019 07/08/2019 05/14/2019  Does Patient Have a Medical Advance Directive? No No No No No No No  Type of Advance Directive - - - - - - -  Does patient want to make changes to medical advance directive? - - - - - - -  Copy of [place] in Chart? - - - - - - -  Would patient like information on creating a medical advance directive? No - Patient declined No - Patient declined No - Patient declined No - Patient declined No - Patient declined No - Patient declined No - Patient declined  Pre-existing out of facility DNR order (yellow form or pink MOST form) - - - - - - -     Hospital Utilization Over the Past 12 Months: # of hospitalizations or ER visits: 10 # of surgeries: 0  Review of Systems    Patient reports that his overall health is unchanged compared to last year.  History obtained from chart review and the patient  Patient Reported Readings (BP, Pulse, CBG, Weight, etc) none  Pain Assessment Pain : No/denies pain     Current Medications & Allergies (verified) Allergies as of 03/07/2020      Reactions   Allopurinol Other (See Comments)   Joint pain worse       Medication List       Accurate as of March 07, 2020  1:55 PM. If you have any questions, ask your nurse or doctor.        STOP taking these medications   Boostrix 5-2.5-18.5 LF-MCG/0.5 injection Generic drug: Tdap     TAKE these medications   aspirin EC 81 MG tablet Take 81 mg by mouth as needed (CHEST PAIN).   baclofen 10 MG tablet Commonly known as: LIORESAL TAKE 1 TABLET BY MOUTH THREE TIMES A DAY   betamethasone dipropionate 0.05 % cream APPLY TO AFFECTED AREA TWICE A DAY What changed:   how much to take  how to take this  when to take this  reasons to take this  additional instructions   buprenorphine 2 MG Subl SL tablet Commonly known as: SUBUTEX 2 mg. Place 2 tablets under tongue twice daily   buprenorphine-naloxone 8-2 mg Subl SL tablet Commonly known as: SUBOXONE Place 0.5 tablets under the  tongue 2 (two) times daily.   busPIRone 10 MG tablet Commonly known as: BUSPAR TAKE 1 TABLET BY MOUTH THREE TIMES A DAY   colchicine 0.6 MG tablet Take 0.6 mg by mouth 2 (two) times daily as needed.   [facility] Folic Acid 194 MCG tablet Generic drug: folic acid TAKE 1 TABLET BY MOUTH EVERY DAY   diclofenac Sodium 1 % Gel Commonly known as: VOLTAREN Apply 4 g topically 4 (four) times daily.   diltiazem 300 MG 24 hr capsule Commonly known as: Cartia XT TAKE ONE CAPSULE BY MOUTH EVERY DAY What changed:   how much to take  how to take  this  when to take this   diltiazem 90 MG 12 hr capsule Commonly known as: CARDIZEM SR Take 1 capsule (90 mg total) by mouth daily as needed. palpitations   DULoxetine 60 MG capsule Commonly known as: CYMBALTA Take 60 mg by mouth at bedtime.   esomeprazole 40 MG capsule Commonly known as: NEXIUM TAKE 1 CAPSULE BY MOUTH EVERY DAY   ezetimibe 10 MG tablet Commonly known as: ZETIA Take 1 tablet (10 mg total) by mouth daily.   fluticasone 50 MCG/ACT nasal spray Commonly known as: FLONASE USE 2 SPRAYS IN EACH NOSTRIL EVERY DAY AS NEEDED   gabapentin 800 MG tablet Commonly known as: NEURONTIN Take 1 tablet (800 mg total) by mouth 3 (three) times daily.   HYDROmorphone 4 MG tablet Commonly known as: DILAUDID Take 4 mg by mouth every 4 (four) hours as needed for moderate pain or severe pain.   indomethacin 50 MG capsule Commonly known as: INDOCIN Take 50 mg by mouth 3 (three) times daily as needed.   ipratropium 0.02 % nebulizer solution Commonly known as: ATROVENT Take 0.5 mg by nebulization every 6 (six) hours as needed for wheezing or shortness of breath.   lactulose 10 GM/15ML solution Commonly known as: CHRONULAC TAKE 2 TABLESPOONS BY MOUTH TWICE DAILY   lidocaine 5 % ointment Commonly known as: XYLOCAINE Apply 1 application topically as needed.   magnesium oxide 400 MG tablet Commonly known as: MAG-OX TAKE 400 MG BY MOUTH DAILY.   metolazone 2.5 MG tablet Commonly known as: ZAROXOLYN Take 1 tablet (2.5 mg total) by mouth 3 (three) times a week.   metoprolol tartrate 25 MG tablet Commonly known as: LOPRESSOR Take 1 tablet (25 mg total) by mouth 2 (two) times daily.   MULTI COMPLETE PO Take 1 tablet by mouth daily.   Muscle Rub 10-15 % Crea Apply 1 application topically 3 (three) times daily as needed for muscle pain. For muscle pain   neomycin-bacitracin-polymyxin ointment Commonly known as: NEOSPORIN Apply 1 application topically every 12 (twelve)  hours as needed for wound care. Reported on 1/74/0814   OneTouch Delica Lancets 48J Misc Test BS QID Dx E11.9   potassium chloride 20 MEQ/15ML (10%) Soln Take 15 mLs (20 mEq total) by mouth 2 (two) times daily.   pravastatin 10 MG tablet Commonly known as: PRAVACHOL Take 1 tablet (10 mg total) by mouth daily.   QUEtiapine 100 MG tablet Commonly known as: SEROQUEL TAKE 1 TABLET BY MOUTH EVERYDAY AT BEDTIME   torsemide 20 MG tablet Commonly known as: DEMADEX Take 4 tablets (80 mg total) by mouth 2 (two) times daily.       History (reviewed): Past Medical History:  Diagnosis Date  . Anxiety   . Atrial fibrillation ([place])   . CHF (congestive heart failure) ([place])   . Cirrhosis ([place])  NASH  . Constipation   . COPD (chronic obstructive pulmonary disease) ([place])   . Depression, recurrent ([place]) 07/18/2016  . GERD (gastroesophageal reflux disease)   . Gout   . Leukopenia 07/08/2015  . Neuromuscular disorder ([facility])    neuropathy in hands and feet  . Psoriasis   . RA (rheumatoid arthritis) ([place])   . Sleep apnea    cpap used- level 10 and greater  . Thrombocytopenia ([place])    Past Surgical History:  Procedure Laterality Date  . ANKLE SURGERY     right ankle talor repair  . CHOLECYSTECTOMY  sept 2016  . CHOLECYSTECTOMY    . COLONOSCOPY  05/08/2011   Procedure: COLONOSCOPY;  Surgeon: Rogene Houston, MD;  Location: [facility];  Service: Endoscopy;  Laterality: N/A;  730  . ESOPHAGOGASTRODUODENOSCOPY  02/28/2011   Procedure: ESOPHAGOGASTRODUODENOSCOPY (EGD);  Surgeon: Rogene Houston, MD;  Location: [facility];  Service: Endoscopy;  Laterality: N/A;  1200  . ESOPHAGOGASTRODUODENOSCOPY N/A 06/11/2012   Procedure: ESOPHAGOGASTRODUODENOSCOPY (EGD);  Surgeon: Rogene Houston, MD;  Location: [facility];  Service: Endoscopy;  Laterality: N/A;  1200  FYI patient is 400 pounds  . ESOPHAGOGASTRODUODENOSCOPY (EGD) WITH PROPOFOL N/A 04/21/2014   Procedure: ESOPHAGOGASTRODUODENOSCOPY  (EGD) WITH PROPOFOL;  Surgeon: Rogene Houston, MD;  Location: [facility];  Service: Endoscopy;  Laterality: N/A;  . HERNIA REPAIR Right    as child; inguinal  . HERNIA REPAIR  sept 2016  . LIVER BIOPSY  2012  . SCIATIC NERVE EXPLORATION    . TONSILLECTOMY     Family History  Problem Relation Age of Onset  . Colon cancer Mother   . Cancer Mother        intestine  . Cancer Father        throat  . Cancer Brother        brain  . Cancer Sister   . Heart attack Neg Hx   . Stroke Neg Hx    Social History   Socioeconomic History  . Marital status: Divorced    Spouse name: Not on file  . Number of children: 1  . Years of education: Not on file  . Highest education level: Some college, no degree  Occupational History  . Occupation: Disabled  Tobacco Use  . Smoking status: Never Smoker  . Smokeless tobacco: Current User    Types: Snuff, Chew  . Tobacco comment: Pt reports that he "dips"  Vaping Use  . Vaping Use: Never used  Substance and Sexual Activity  . Alcohol use: No    Alcohol/week: 1.0 standard drink    Types: 1 Cans of beer per week    Comment: stopped drinking 2-3 yrs ago.   . Drug use: No    Comment: Use to smoke cocaine and marijuana. No IV drug use  . Sexual activity: Yes  Other Topics Concern  . Not on file  Social History Narrative  . Not on file   Social Determinants of Health   Financial Resource Strain: Medium Risk  . Difficulty of Paying Living Expenses: Somewhat hard  Food Insecurity: No Food Insecurity  . Worried About Charity fundraiser in the Last Year: Never true  . Ran Out of Food in the Last Year: Never true  Transportation Needs: No Transportation Needs  . Lack of Transportation (Medical): No  . Lack of Transportation (Non-Medical): No  Physical Activity: Inactive  . Days of Exercise per Week: 0 days  . Minutes of Exercise  per Session: 0 min  Stress: Not on file  Social Connections: Not on file    Activities of Daily Living In your  present state of health, do you have any difficulty performing the following activities: 03/07/2020 12/30/2019  Hearing? N N  Vision? N N  Comment Wears glasses -  Difficulty concentrating or making decisions? N N  Walking or climbing stairs? Y Y  Comment Due to knees and ankles -  Dressing or bathing? N N  Doing errands, shopping? N Y  Comment - has someone drive him to appointments outside of local area  Preparing Food and eating ? N N  Using the Toilet? N N  In the past six months, have you accidently leaked urine? Y N  Comment At times due to diuretic -  Do you have problems with loss of bowel control? N N  Managing your Medications? N N  Managing your Finances? N N  Housekeeping or managing your Housekeeping? N N  Some recent data might be hidden    Patient Education/ Literacy How often do you need to have someone help you when you read instructions, pamphlets, or other written materials from your doctor or pharmacy?: 1 - Never What is the last grade level you completed in school?: Some College  Exercise Current Exercise Habits: The patient does not participate in regular exercise at present, Exercise limited by: orthopedic condition(s)  Diet Patient reports consuming 2 meals a day and 2 snack(s) a day Patient reports that his primary diet is: Regular Patient reports that she does have regular access to food.   Depression Screen PHQ 2/9 Scores 03/07/2020 02/22/2020 08/28/2019 05/20/2019 07/10/2018 02/17/2018 09/11/2017  PHQ - 2 Score 0 0 0 0 0 2 0  PHQ- 9 Score - - - - - 8 -     Fall Risk Fall Risk  03/07/2020 02/22/2020 08/28/2019 05/20/2019 02/17/2018  Falls in the past year? 1 0 0 0 0  Number falls in past yr: 0 - - - -  Injury with Fall? 0 - - - -  Risk for fall due to : Impaired mobility - - - -  Follow up Falls evaluation completed - - - -     Objective:  [name] seemed alert and oriented and he participated appropriately during our telephone visit.  Blood  Pressure Weight BMI  BP Readings from Last 3 Encounters:  02/22/20 (!) 143/80  10/15/19 (!) 101/58  08/28/19 103/67   Wt Readings from Last 3 Encounters:  02/22/20 (!) 390 lb (176.9 kg)  10/14/19 (!) 305 lb (138.3 kg)  08/28/19 (!) 324 lb (147 kg)   BMI Readings from Last 1 Encounters:  02/22/20 52.89 kg/m    *Unable to obtain current vital signs, weight, and BMI due to telephone visit type  Hearing/Vision  . [name] did not seem to have difficulty with hearing/understanding during the telephone conversation . Reports that he has not had a formal eye exam by an eye care professional within the past year . Reports that he has not had a formal hearing evaluation within the past year *Unable to fully assess hearing and vision during telephone visit type  Cognitive Function: 6CIT Screen 03/07/2020  What Year? 0 points  What month? 0 points  What time? 0 points  Count back from 20 0 points  Months in reverse 2 points  Repeat phrase 0 points  Total Score 2   (Normal:0-7, Significant for Dysfunction: >8)  Normal Cognitive Function Screening: Yes  Immunization & Health Maintenance Record Immunization History  Administered Date(s) Administered  . Influenza Split 11/12/2007, 10/16/2013, 04/08/2015  . Influenza Whole 10/11/2008, 01/09/2010, 11/09/2011  . Influenza,inj,Quad PF,6+ Mos 09/10/2012, 10/17/2015, 09/24/2018, 02/22/2020  . Influenza-Unspecified 10/17/2011, 10/17/2015, 12/29/2016  . Moderna Sars-Covid-2 Vaccination 07/27/2019, 08/27/2019  . Pneumococcal Conjugate-13 05/10/2017  . Pneumococcal Polysaccharide-23 09/09/2006, 09/08/2008  . Td 01/09/2008  . Tdap 08/10/2019    Health Maintenance  Topic Date Due  . FOOT EXAM  07/18/2017  . URINE MICROALBUMIN  07/18/2017  . COVID-19 Vaccine (3 - Moderna risk 4-dose series) 09/24/2019  . HEMOGLOBIN A1C  05/15/2020  . OPHTHALMOLOGY EXAM  06/16/2020  . COLONOSCOPY (Pts 45-41yr Insurance coverage will need to be confirmed)   05/07/2021  . TETANUS/TDAP  08/09/2029  . INFLUENZA VACCINE  Completed  . PNEUMOCOCCAL POLYSACCHARIDE VACCINE AGE 93-64 HIGH RISK  Completed  . Hepatitis C Screening  Completed  . HIV Screening  Completed       Assessment  This is a routine wellness examination for [name].  Health Maintenance: Due or Overdue Health Maintenance Due  Topic Date Due  . FOOT EXAM  07/18/2017  . URINE MICROALBUMIN  07/18/2017  . COVID-19 Vaccine (3 - Moderna risk 4-dose series) 09/24/2019    BLillard AnesSprinkle does not need a referral for Community Assistance: Care Management:   no Social Work:    no Prescription Assistance:  no Nutrition/Diabetes Education:  no   Plan:  Personalized Goals Goals Addressed            This Visit's Progress   . AWV       03/07/2020 AWV Goal: Exercise for General Health   Patient will verbalize understanding of the benefits of increased physical activity:  Exercising regularly is important. It will improve your overall fitness, flexibility, and endurance.  Regular exercise also will improve your overall health. It can help you control your weight, reduce stress, and improve your bone density.  Over the next year, patient will increase physical activity as tolerated with a goal of at least 150 minutes of moderate physical activity per week.   You can tell that you are exercising at a moderate intensity if your heart starts beating faster and you start breathing faster but can still hold a conversation.  Moderate-intensity exercise ideas include:  Walking 1 mile (1.6 km) in about 15 minutes  Biking  Hiking  Golfing  Dancing  Water aerobics  Patient will verbalize understanding of everyday activities that increase physical activity by providing examples like the following: ? Yard work, such as: ? Pushing a lConservation officer, nature? Raking and bagging leaves ? Washing your car ? Pushing a stroller ? Shoveling snow ? Gardening ? Washing windows or  floors  Patient will be able to explain general safety guidelines for exercising:   Before you start a new exercise program, talk with your health care provider.  Do not exercise so much that you hurt yourself, feel dizzy, or get very short of breath.  Wear comfortable clothes and wear shoes with good support.  Drink plenty of water while you exercise to prevent dehydration or heat stroke.  Work out until your breathing and your heartbeat get faster.       Personalized Health Maintenance & Screening Recommendations  Foot exam  Urine Microalbumin COVID 19 Vaccine Booster  Lung Cancer Screening Recommended: no (Low Dose CT Chest recommended if Age 323-80years, 30 pack-year currently smoking OR have quit w/in past 15 years) Hepatitis C  Screening recommended: no HIV Screening recommended: no  Advanced Directives: Written information was not prepared per patient's request.  Referrals & Orders No orders of the defined types were placed in this encounter.   Follow-up Plan . Follow-up with Dettinger, Fransisca Kaufmann, MD as planned . Keep your yearly eye exam appointment . AVS printed and mailed to patient    I have personally reviewed and noted the following in the patient's chart:   . Medical and social history . Use of alcohol, tobacco or illicit drugs  . Current medications and supplements . Functional ability and status . Nutritional status . Physical activity . Advanced directives . List of other physicians . Hospitalizations, surgeries, and ER visits in previous 12 months . Vitals . Screenings to include cognitive, depression, and falls . Referrals and appointments  In addition, I have reviewed and discussed with [name] certain preventive protocols, quality metrics, and best practice recommendations. A written personalized care plan for preventive services as well as general preventive health recommendations is available and can be mailed to the patient at his  request.     Lynnea Ferrier, LPN  04/24/3843

## 2020-03-14 DIAGNOSIS — G4733 Obstructive sleep apnea (adult) (pediatric): Secondary | ICD-10-CM | POA: Diagnosis not present

## 2020-03-14 DIAGNOSIS — G5712 Meralgia paresthetica, left lower limb: Secondary | ICD-10-CM | POA: Diagnosis not present

## 2020-03-14 DIAGNOSIS — M109 Gout, unspecified: Secondary | ICD-10-CM | POA: Diagnosis not present

## 2020-03-14 DIAGNOSIS — M130 Polyarthritis, unspecified: Secondary | ICD-10-CM | POA: Diagnosis not present

## 2020-03-14 DIAGNOSIS — G894 Chronic pain syndrome: Secondary | ICD-10-CM | POA: Diagnosis not present

## 2020-03-14 DIAGNOSIS — Z79891 Long term (current) use of opiate analgesic: Secondary | ICD-10-CM | POA: Diagnosis not present

## 2020-03-16 ENCOUNTER — Inpatient Hospital Stay (HOSPITAL_COMMUNITY): Payer: Medicare Other | Attending: Hematology

## 2020-03-16 ENCOUNTER — Encounter: Payer: Self-pay | Admitting: Family Medicine

## 2020-03-16 ENCOUNTER — Other Ambulatory Visit: Payer: Self-pay

## 2020-03-16 ENCOUNTER — Ambulatory Visit: Payer: Medicare HMO | Admitting: Family Medicine

## 2020-03-16 DIAGNOSIS — D696 Thrombocytopenia, unspecified: Secondary | ICD-10-CM | POA: Diagnosis not present

## 2020-03-16 DIAGNOSIS — D509 Iron deficiency anemia, unspecified: Secondary | ICD-10-CM | POA: Diagnosis not present

## 2020-03-16 DIAGNOSIS — D61818 Other pancytopenia: Secondary | ICD-10-CM

## 2020-03-16 LAB — CBC WITH DIFFERENTIAL/PLATELET
Abs Immature Granulocytes: 0.01 10*3/uL (ref 0.00–0.07)
Basophils Absolute: 0 10*3/uL (ref 0.0–0.1)
Basophils Relative: 1 %
Eosinophils Absolute: 0.1 10*3/uL (ref 0.0–0.5)
Eosinophils Relative: 6 %
HCT: 33.1 % — ABNORMAL LOW (ref 39.0–52.0)
Hemoglobin: 10.5 g/dL — ABNORMAL LOW (ref 13.0–17.0)
Immature Granulocytes: 1 %
Lymphocytes Relative: 33 %
Lymphs Abs: 0.4 10*3/uL — ABNORMAL LOW (ref 0.7–4.0)
MCH: 27.6 pg (ref 26.0–34.0)
MCHC: 31.7 g/dL (ref 30.0–36.0)
MCV: 86.9 fL (ref 80.0–100.0)
Monocytes Absolute: 0.1 10*3/uL (ref 0.1–1.0)
Monocytes Relative: 10 %
Neutro Abs: 0.5 10*3/uL — ABNORMAL LOW (ref 1.7–7.7)
Neutrophils Relative %: 49 %
Platelets: 51 10*3/uL — ABNORMAL LOW (ref 150–400)
RBC: 3.81 MIL/uL — ABNORMAL LOW (ref 4.22–5.81)
RDW: 14.9 % (ref 11.5–15.5)
WBC: 1.1 10*3/uL — CL (ref 4.0–10.5)
nRBC: 0 % (ref 0.0–0.2)

## 2020-03-16 LAB — IRON AND TIBC
Iron: 39 ug/dL — ABNORMAL LOW (ref 45–182)
Saturation Ratios: 11 % — ABNORMAL LOW (ref 17.9–39.5)
TIBC: 343 ug/dL (ref 250–450)
UIBC: 304 ug/dL

## 2020-03-16 LAB — FERRITIN: Ferritin: 47 ng/mL (ref 24–336)

## 2020-03-16 LAB — VITAMIN B12: Vitamin B-12: 512 pg/mL (ref 180–914)

## 2020-03-17 DIAGNOSIS — M25561 Pain in right knee: Secondary | ICD-10-CM | POA: Diagnosis not present

## 2020-03-17 DIAGNOSIS — G8929 Other chronic pain: Secondary | ICD-10-CM | POA: Diagnosis not present

## 2020-03-23 ENCOUNTER — Inpatient Hospital Stay (HOSPITAL_BASED_OUTPATIENT_CLINIC_OR_DEPARTMENT_OTHER): Payer: Medicare Other | Admitting: Physician Assistant

## 2020-03-23 ENCOUNTER — Ambulatory Visit (HOSPITAL_COMMUNITY): Payer: Medicare HMO | Admitting: Hematology

## 2020-03-23 ENCOUNTER — Other Ambulatory Visit: Payer: Self-pay

## 2020-03-23 VITALS — BP 120/70 | HR 86 | Temp 97.9°F | Resp 18

## 2020-03-23 DIAGNOSIS — D61818 Other pancytopenia: Secondary | ICD-10-CM | POA: Diagnosis not present

## 2020-03-23 DIAGNOSIS — D696 Thrombocytopenia, unspecified: Secondary | ICD-10-CM | POA: Diagnosis not present

## 2020-03-23 DIAGNOSIS — D509 Iron deficiency anemia, unspecified: Secondary | ICD-10-CM | POA: Diagnosis not present

## 2020-03-23 NOTE — Progress Notes (Signed)
[name], [name] [id number]   CLINIC:  Medical Oncology/Hematology  PCP:  Pcp, No No address on file None   REASON FOR VISIT:  Follow-up for pancytopenia  CURRENT THERAPY: Intermittent IV iron, close obseration  INTERVAL HISTORY:  [name] 55 y.o. male returns for routine follow-up of long-standing leukopenia/thrombocytopenia secondary to cirrhosis and splenomegaly, as well as iron-deficiency anemia.  He is status post bone marrow aspiration and biopsy on 10/24/2015 that demonstrated normocellular bone marrow with trilineage hematopoiesis and normal cytogenetics.  He has iron-deficiency anemia, likely secondary to malabsorption in the setting of alcohol abuse and cirrhosis with the possibility of slow chronic blood loss from varices. He has required intermittent IV iron infusions.  He has not required RBC blood transfusions in the past.  He was last seen via virtual visit with Dr. Delton Coombes on 10/28/2019.  He has been doing well since his last visit.  He reports easy bruising, but denies any major bleeding events.  No recent infections.  No abdominal pain.  Baseline fatigue without acute worsening of energy levels.  He reports that he has had some struggles with increased weight due to fluid retention, but this has improved after medication adjustment by his PCP.  He denies any B symptoms.  Denies any nausea, vomiting, or diarrhea. Denies any new pains. No new neurologic symptoms such as new-onset hearing loss, blurred vision, headache, or dizziness.  No thromboembolic events since his last visit. Had not noticed any recent bleeding such as epistaxis, hematuria or hematochezia. Denies recent chest pain on exertion, shortness of breath on minimal exertion, pre-syncopal episodes, or palpitations. Denies any numbness or tingling in hands or feet. Denies any recent fevers, infections, or recent hospitalizations. No new masses or lymphadenopathy per  his report. Patient reports appetite at 80% and energy level at baseine.    REVIEW OF SYSTEMS:  Review of Systems  Constitutional: Negative for appetite change, chills, diaphoresis, fatigue, fever and unexpected weight change.  HENT:   Negative for lump/mass and nosebleeds.   Eyes: Negative for eye problems.  Respiratory: Negative for cough, hemoptysis and shortness of breath.   Cardiovascular: Negative for chest pain, leg swelling and palpitations.  Gastrointestinal: Negative for abdominal pain, blood in stool, constipation, diarrhea, nausea and vomiting.  Genitourinary: Negative for hematuria.   Skin: Negative.   Neurological: Negative for dizziness, headaches and light-headedness.  Hematological: Bruises/bleeds easily.      PAST MEDICAL/SURGICAL HISTORY:  Past Medical History:  Diagnosis Date  . Anxiety   . Atrial fibrillation ([place])   . CHF (congestive heart failure) ([place])   . Cirrhosis ([place])    NASH  . Constipation   . COPD (chronic obstructive pulmonary disease) ([place])   . Depression, recurrent ([place]) 07/18/2016  . GERD (gastroesophageal reflux disease)   . Gout   . Leukopenia 07/08/2015  . Neuromuscular disorder ([facility])    neuropathy in hands and feet  . Psoriasis   . RA (rheumatoid arthritis) ([place])   . Sleep apnea    cpap used- level 10 and greater  . Thrombocytopenia ([place])    Past Surgical History:  Procedure Laterality Date  . ANKLE SURGERY     right ankle talor repair  . CHOLECYSTECTOMY  sept 2016  . CHOLECYSTECTOMY    . COLONOSCOPY  05/08/2011   Procedure: COLONOSCOPY;  Surgeon: Rogene Houston, MD;  [facility];  Service: Endoscopy;  Laterality: N/A;  730  . ESOPHAGOGASTRODUODENOSCOPY  02/28/2011  Procedure: ESOPHAGOGASTRODUODENOSCOPY (EGD);  Surgeon: Rogene Houston, MD;  Location: [facility];  Service: Endoscopy;  Laterality: N/A;  1200  . ESOPHAGOGASTRODUODENOSCOPY N/A 06/11/2012   Procedure: ESOPHAGOGASTRODUODENOSCOPY (EGD);  Surgeon: Rogene Houston, MD;  Location: [facility];  Service: Endoscopy;  Laterality: N/A;  1200  FYI patient is 400 pounds  . ESOPHAGOGASTRODUODENOSCOPY (EGD) WITH PROPOFOL N/A 04/21/2014   Procedure: ESOPHAGOGASTRODUODENOSCOPY (EGD) WITH PROPOFOL;  Surgeon: Rogene Houston, MD;  Location: [facility];  Service: Endoscopy;  Laterality: N/A;  . HERNIA REPAIR Right    as child; inguinal  . HERNIA REPAIR  sept 2016  . LIVER BIOPSY  2012  . SCIATIC NERVE EXPLORATION    . TONSILLECTOMY       SOCIAL HISTORY:  Social History   Socioeconomic History  . Marital status: Divorced    Spouse name: Not on file  . Number of children: 1  . Years of education: Not on file  . Highest education level: Some college, no degree  Occupational History  . Occupation: Disabled  Tobacco Use  . Smoking status: Never Smoker  . Smokeless tobacco: Current User    Types: Snuff, Chew  . Tobacco comment: Pt reports that he "dips"  Vaping Use  . Vaping Use: Never used  Substance and Sexual Activity  . Alcohol use: No    Alcohol/week: 1.0 standard drink    Types: 1 Cans of beer per week    Comment: stopped drinking 2-3 yrs ago.   . Drug use: No    Comment: Use to smoke cocaine and marijuana. No IV drug use  . Sexual activity: Yes  Other Topics Concern  . Not on file  Social History Narrative  . Not on file   Social Determinants of Health   Financial Resource Strain: Medium Risk  . Difficulty of Paying Living Expenses: Somewhat hard  Food Insecurity: No Food Insecurity  . Worried About Charity fundraiser in the Last Year: Never true  . Ran Out of Food in the Last Year: Never true  Transportation Needs: No Transportation Needs  . Lack of Transportation (Medical): No  . Lack of Transportation (Non-Medical): No  Physical Activity: Inactive  . Days of Exercise per Week: 0 days  . Minutes of Exercise per Session: 0 min  Stress: Not on file  Social Connections: Not on file  Intimate Partner Violence: Not on file     FAMILY HISTORY:  Family History  Problem Relation Age of Onset  . Colon cancer Mother   . Cancer Mother        intestine  . Cancer Father        throat  . Cancer Brother        brain  . Cancer Sister   . Heart attack Neg Hx   . Stroke Neg Hx     CURRENT MEDICATIONS:  Outpatient Encounter Medications as of 03/23/2020  Medication Sig Note  . aspirin EC 81 MG tablet Take 81 mg by mouth as needed (CHEST PAIN).  06/10/2019: PRN  . baclofen (LIORESAL) 10 MG tablet TAKE 1 TABLET BY MOUTH THREE TIMES A DAY   . betamethasone dipropionate 0.05 % cream APPLY TO AFFECTED AREA TWICE A DAY (Patient taking differently: Apply 1 application topically daily as needed (itching).)   . buprenorphine (SUBUTEX) 2 MG SUBL SL tablet 2 mg. Place 2 tablets under tongue twice daily 10/14/2019: Pt states doctor goes back and forth between subutex and suboxene.  . buprenorphine-naloxone (SUBOXONE)  8-2 mg SUBL SL tablet Place 0.5 tablets under the tongue 2 (two) times daily.   . busPIRone (BUSPAR) 10 MG tablet TAKE 1 TABLET BY MOUTH THREE TIMES A DAY   . colchicine 0.6 MG tablet Take 0.6 mg by mouth 2 (two) times daily as needed.    . [employer] FOLIC ACID 967 MCG tablet TAKE 1 TABLET BY MOUTH EVERY DAY   . diclofenac Sodium (VOLTAREN) 1 % GEL Apply 4 g topically 4 (four) times daily.   Marland Kitchen diltiazem (CARDIZEM SR) 90 MG 12 hr capsule Take 1 capsule (90 mg total) by mouth daily as needed. palpitations   . diltiazem (CARTIA XT) 300 MG 24 hr capsule TAKE ONE CAPSULE BY MOUTH EVERY DAY (Patient taking differently: Take 300 mg by mouth daily. TAKE ONE CAPSULE BY MOUTH EVERY DAY)   . DULoxetine (CYMBALTA) 60 MG capsule Take 60 mg by mouth at bedtime.    Marland Kitchen esomeprazole (NEXIUM) 40 MG capsule TAKE 1 CAPSULE BY MOUTH EVERY DAY   . ezetimibe (ZETIA) 10 MG tablet Take 1 tablet (10 mg total) by mouth daily.   . fluticasone (FLONASE) 50 MCG/ACT nasal spray USE 2 SPRAYS IN EACH NOSTRIL EVERY DAY AS NEEDED   . gabapentin (NEURONTIN)  800 MG tablet Take 1 tablet (800 mg total) by mouth 3 (three) times daily.   Marland Kitchen HYDROmorphone (DILAUDID) 4 MG tablet Take 4 mg by mouth every 4 (four) hours as needed for moderate pain or severe pain.    . indomethacin (INDOCIN) 50 MG capsule Take 50 mg by mouth 3 (three) times daily as needed.   Marland Kitchen ipratropium (ATROVENT) 0.02 % nebulizer solution Take 0.5 mg by nebulization every 6 (six) hours as needed for wheezing or shortness of breath.    . lactulose (CHRONULAC) 10 GM/15ML solution TAKE 2 TABLESPOONS BY MOUTH TWICE DAILY   . lidocaine (XYLOCAINE) 5 % ointment Apply 1 application topically as needed.   . magnesium oxide (MAG-OX) 400 MG tablet TAKE 400 MG BY MOUTH DAILY.   Marland Kitchen Menthol-Methyl Salicylate (MUSCLE RUB) 10-15 % CREA Apply 1 application topically 3 (three) times daily as needed for muscle pain. For muscle pain   . metolazone (ZAROXOLYN) 2.5 MG tablet Take 1 tablet (2.5 mg total) by mouth 3 (three) times a week.   . metoprolol tartrate (LOPRESSOR) 25 MG tablet Take 1 tablet (25 mg total) by mouth 2 (two) times daily.   . Multiple Vitamins-Minerals (MULTI COMPLETE PO) Take 1 tablet by mouth daily.    Marland Kitchen neomycin-bacitracin-polymyxin (NEOSPORIN) ointment Apply 1 application topically every 12 (twelve) hours as needed for wound care. Reported on 06/01/2015   . OneTouch Delica Lancets 89F MISC Test BS QID Dx E11.9   . potassium chloride 20 MEQ/15ML (10%) SOLN Take 15 mLs (20 mEq total) by mouth 2 (two) times daily.   . pravastatin (PRAVACHOL) 10 MG tablet Take 1 tablet (10 mg total) by mouth daily.   . QUEtiapine (SEROQUEL) 100 MG tablet TAKE 1 TABLET BY MOUTH EVERYDAY AT BEDTIME   . torsemide (DEMADEX) 20 MG tablet Take 4 tablets (80 mg total) by mouth 2 (two) times daily.    No facility-administered encounter medications on file as of 03/23/2020.    ALLERGIES:  Allergies  Allergen Reactions  . Allopurinol Other (See Comments)    Joint pain worse      PHYSICAL EXAM:  ECOG PERFORMANCE  STATUS: 2 - Symptomatic, <50% confined to bed  There were no vitals filed for this visit. There were  no vitals filed for this visit. Physical Exam Constitutional:      Appearance: Normal appearance. He is obese.  HENT:     Head: Normocephalic and atraumatic.     Mouth/Throat:     Mouth: Mucous membranes are moist.  Eyes:     Extraocular Movements: Extraocular movements intact.     Pupils: Pupils are equal, round, and reactive to light.  Cardiovascular:     Rate and Rhythm: Normal rate and regular rhythm.     Pulses: Normal pulses.     Heart sounds: Normal heart sounds.  Pulmonary:     Effort: Pulmonary effort is normal.     Breath sounds: Normal breath sounds.  Abdominal:     General: Bowel sounds are normal.     Palpations: Abdomen is soft.     Tenderness: There is no abdominal tenderness.  Musculoskeletal:        General: No swelling.     Right lower leg: Edema present.     Left lower leg: Edema present.  Lymphadenopathy:     Cervical: No cervical adenopathy.  Skin:    General: Skin is warm and dry.  Neurological:     General: No focal deficit present.     Mental Status: He is alert and oriented to person, place, and time.  Psychiatric:        Mood and Affect: Mood normal.        Behavior: Behavior normal.      LABORATORY DATA:  I have reviewed the labs as listed.  CBC    Component Value Date/Time   WBC 1.1 (LL) 03/16/2020 1016   RBC 3.81 (L) 03/16/2020 1016   HGB 10.5 (L) 03/16/2020 1016   HGB 11.4 (L) 02/22/2020 1138   HCT 33.1 (L) 03/16/2020 1016   HCT 33.9 (L) 02/22/2020 1138   PLT 51 (L) 03/16/2020 1016   PLT 38 (LL) 02/22/2020 1138   MCV 86.9 03/16/2020 1016   MCV 79 02/22/2020 1138   MCH 27.6 03/16/2020 1016   MCHC 31.7 03/16/2020 1016   RDW 14.9 03/16/2020 1016   RDW 14.0 02/22/2020 1138   LYMPHSABS 0.4 (L) 03/16/2020 1016   LYMPHSABS 0.4 (L) 02/22/2020 1138   MONOABS 0.1 03/16/2020 1016   EOSABS 0.1 03/16/2020 1016   EOSABS 0.1 02/22/2020  1138   BASOSABS 0.0 03/16/2020 1016   BASOSABS 0.0 02/22/2020 1138   CMP Latest Ref Rng & Units 02/22/2020 11/16/2019 10/23/2019  Glucose 65 - 99 mg/dL 154(H) 130(H) 103(H)  BUN 6 - 24 mg/dL 14 8 8   Creatinine 0.76 - 1.27 mg/dL 1.06 0.72(L) 0.66  Sodium 134 - 144 mmol/L 136 142 134(L)  Potassium 3.5 - 5.2 mmol/L 3.3(L) 3.9 3.9  Chloride 96 - 106 mmol/L 95(L) 102 102  CO2 20 - 29 mmol/L 24 27 27   Calcium 8.7 - 10.2 mg/dL 8.5(L) 9.3 9.1  Total Protein 6.0 - 8.5 g/dL 6.2 6.8 6.7  Total Bilirubin 0.0 - 1.2 mg/dL 1.2 1.0 1.2  Alkaline Phos 44 - 121 IU/L 141(H) 97 82  AST 0 - 40 IU/L 42(H) 25 25  ALT 0 - 44 IU/L 15 8 13     DIAGNOSTIC IMAGING:  I have independently reviewed the relevant imaging and discussed with the patient.  ASSESSMENT: 1.  Pancytopenia, secondary to cirrhosis of the liver and splenomegaly -Secondary to cirrhosis and splenomegaly. -Status post bone marrow aspiration and biopsy on 10/24/2015 that demonstrated normocellular bone marrow with trilineage hematopoiesis and normal cytogenetics -Abdominal ultrasound performed on  03/02/2020 showed liver cirrhosis splenomegaly, with spleen measuring 20.6 cm in length with a calculated volume 2334 mL -Labs reviewed from 03/16/2020 showing WBC 1.1, ANC 0.5, Hgb 10.5 with normal MCV, and platelets 51 -No frequent infections or bleeding episodes  2. Portal vein thrombus -Abdominal ultrasound performed 03/02/2020 continues to show chronic portal vein thrombus described as a stable nonocclusive intraluminal thrombus within the main portal vein -Not a candidate for anticoagulation due to thrombocytopenia and bleeding risk -History of GI bleeding while on warfarin for portal vein thrombus  3.  Iron deficiency -Likely secondary to malabsorption of nutrients in the setting of alcohol use and cirrhosis, may also have some slow chronic blood loss related to cirrhotic varices -Unable to tolerate oral iron -Has required intermittent IV  infusions, with last IV Venofer given in July 2021 -Labs reviewed from 03/16/2020 showing serum iron 39, iron saturation 11%, and ferritin 47  4. Liver cirrhosis -Follows with GI for hepatologic management -Receiving abdominal ultrasounds via GI every 6 months to screen for [facility], given his high risk status of developing hepatocellular carcinoma    PLAN:  1.  Pancytopenia, secondary to cirrhosis of the liver and splenomegaly -Labs reviewed from 03/16/2020 showing WBC 1.1, ANC 0.5, Hgb 10.5 with normal MCV, and platelets 51 -Patient counseled on neutropenic precautions given his ANC of 0.5 -Check nutritional panel (iron, B12, folate, MMA, copper), check SPEP -If neutropenia and thrombocytopenia become a critical or symptomatic, may consider referral for splenic artery embolization with interventional radiology  -We will continue to monitor closely with repeat labs (CBC, CMP) and follow-up in 3 months  2.  Portal vein thrombus -Portal vein thrombus appears stable on serial abdominal ultrasounds, no signs of expansion -Not a candidate for anticoagulation due to thrombocytopenia  3.  Iron deficiency -Labs reviewed from 03/16/2020 showing serum iron 39, iron saturation 11%, and ferritin 47 with hemoglobin 10.5 -Recommend IV iron, will schedule for IV Feraheme 510 mg x 2 dose  4.  Liver cirrhosis -Continue follow-up with GI, along with regular abdominal ultrasounds to screen for changes indicative of hepatocellular carcinoma   PLAN SUMMARY & DISPOSITION -Labs and RTC in 3 months  All questions were answered. The patient knows to call the clinic with any problems, questions or concerns.  Medical decision making: Low  Time spent on visit: I spent 20 minutes counseling the patient face to face. The total time spent in the appointment was 30 minutes and more than 50% was on counseling.   Harriett Rush, PA-C  03/23/20 8:38 AM

## 2020-03-23 NOTE — Patient Instructions (Signed)
[place] at [facility] Discharge Instructions  You were seen today by Tarri Abernethy PA-C for your low blood counts (pancytopenia).    LABS: Return in 3 months for labs.   OTHER TESTS: None  MEDICATIONS: Schedule for IV infusion x 2  FOLLOW-UP APPOINTMENT: Return to clinic in 3 months.  **Warning symptoms:  Please seek medical attention and let us know if you experience any new infections, bleeding episodes, or other alarming symptoms that concern you.  Thank you for choosing [place] at [facility] to provide your oncology and hematology care.  To afford each patient quality time with our provider, please arrive at least 15 minutes before your scheduled appointment time.   If you have a lab appointment with the [facility] please come in thru the Main Entrance and check in at the main information desk.  You need to re-schedule your appointment should you arrive 10 or more minutes late.  We strive to give you quality time with our providers, and arriving late affects you and other patients whose appointments are after yours.  Also, if you no show three or more times for appointments you may be dismissed from the clinic at the providers discretion.     Again, thank you for choosing [facility].  Our hope is that these requests will decrease the amount of time that you wait before being seen by our physicians.       _____________________________________________________________  Should you have questions after your visit to [facility], please contact our office at [phone] and follow the prompts.  Our office hours are 8:00 a.m. and 4:30 p.m. Monday - Friday.  Please note that voicemails left after 4:00 p.m. may not be returned until the following business day.  We are closed weekends and major holidays.  You do have access to a nurse 24-7, just call the main number to the clinic [phone] and do not press  any options, hold on the line and a nurse will answer the phone.    For prescription refill requests, have your pharmacy contact our office and allow 72 hours.    Due to Covid, you will need to wear a mask upon entering the hospital. If you do not have a mask, a mask will be given to you at the Main Entrance upon arrival. For doctor visits, patients may have 1 support person age 18 or older with them. For treatment visits, patients can not have anyone with them due to social distancing guidelines and our immunocompromised population.

## 2020-03-24 ENCOUNTER — Other Ambulatory Visit: Payer: Self-pay | Admitting: Family Medicine

## 2020-03-24 DIAGNOSIS — I5032 Chronic diastolic (congestive) heart failure: Secondary | ICD-10-CM

## 2020-03-27 DIAGNOSIS — Z8679 Personal history of other diseases of the circulatory system: Secondary | ICD-10-CM | POA: Diagnosis not present

## 2020-03-27 DIAGNOSIS — R5383 Other fatigue: Secondary | ICD-10-CM | POA: Diagnosis not present

## 2020-03-27 DIAGNOSIS — Z8639 Personal history of other endocrine, nutritional and metabolic disease: Secondary | ICD-10-CM | POA: Diagnosis not present

## 2020-03-27 DIAGNOSIS — Z8669 Personal history of other diseases of the nervous system and sense organs: Secondary | ICD-10-CM | POA: Diagnosis not present

## 2020-03-27 DIAGNOSIS — R6883 Chills (without fever): Secondary | ICD-10-CM | POA: Diagnosis not present

## 2020-03-28 ENCOUNTER — Telehealth: Payer: Self-pay

## 2020-03-28 ENCOUNTER — Other Ambulatory Visit: Payer: Self-pay | Admitting: Family Medicine

## 2020-03-28 NOTE — Telephone Encounter (Signed)
Pt called and aware of recent cbc

## 2020-03-29 ENCOUNTER — Ambulatory Visit (HOSPITAL_COMMUNITY): Payer: Medicare Other

## 2020-03-29 ENCOUNTER — Telehealth: Payer: Self-pay | Admitting: *Deleted

## 2020-03-29 MED ORDER — SODIUM CHLORIDE 0.9 % IV SOLN
510.0000 mg | Freq: Once | INTRAVENOUS | Status: DC
  Filled 2020-03-29: qty 17

## 2020-03-29 MED ORDER — SODIUM CHLORIDE 0.9 % IV SOLN: Freq: Once | INTRAVENOUS | Status: DC

## 2020-03-29 NOTE — Telephone Encounter (Signed)
Patient dismissed from [facility]

## 2020-03-29 NOTE — Telephone Encounter (Signed)
03/29/2020  Patient has been discharged from [facility] effective 03/16/20 in accordance with the No Show policy. Patient will be removed from CCM program and CCM team will be removed from Care Team list.   Chong Sicilian, BSN, RN-BC [place] [facility] Management Direct Dial: [phone]

## 2020-03-29 NOTE — Telephone Encounter (Signed)
Thank you for updating me on this. I'll check to see if it's a balance owed issue and if it's expected to be resolved.

## 2020-04-05 ENCOUNTER — Inpatient Hospital Stay (HOSPITAL_COMMUNITY): Payer: Medicare Other

## 2020-04-05 ENCOUNTER — Encounter (HOSPITAL_COMMUNITY): Payer: Self-pay

## 2020-04-05 VITALS — BP 151/80 | HR 78 | Temp 97.1°F | Resp 18

## 2020-04-05 DIAGNOSIS — D509 Iron deficiency anemia, unspecified: Secondary | ICD-10-CM | POA: Diagnosis not present

## 2020-04-05 DIAGNOSIS — D696 Thrombocytopenia, unspecified: Secondary | ICD-10-CM | POA: Diagnosis not present

## 2020-04-05 MED ORDER — SODIUM CHLORIDE 0.9 % IV SOLN
510.0000 mg | Freq: Once | INTRAVENOUS | Status: AC
  Administered 2020-04-05: 510 mg via INTRAVENOUS
  Filled 2020-04-05: qty 510

## 2020-04-05 MED ORDER — SODIUM CHLORIDE 0.9 % IV SOLN: Freq: Once | INTRAVENOUS | Status: AC

## 2020-04-05 NOTE — Progress Notes (Signed)
Cardiology Office Note   Date:  04/06/2020   ID:  [name], DOB 1965/12/18, MRN [id number]  PCP:  Pcp, No  Cardiologist:   No primary care provider on file.   Chief Complaint  Patient presents with  . Anxiety      History of Present Illness: [name] is a 55 y.o. male who presents for follow up of diastolic heart failure and a history of atrial fibrillation. He has missed many follow up appts.  He was in the hospital in May of last year.   He had acute diastolic HF.  I reviewed these records for this visit.    He was diuresed 10 liters.  He had atrial fib with RVR but was not thought to be an anticoagulation risk because of previous upper GI bleed.    Looking through his blood work his liver enzymes have been okay recently.  He still quite pancytopenic which is being managed by hematology.  He is most bothered by panic attacks.  Recently he was seen at [facility] for panic for panic and I see that his potassium is 2.2.  I cannot see those records but he was apparently given some potassium and I do see that there was a follow-up that was 2.9.  At that time he was taking 80 mg of torsemide apparently and he is now down to 60.  He was taking 40 mEq of potassium and he is now up to 80.  He is due to see a new primary doctor.  In a few days I have asked him to get a basic metabolic profile.  He still battling with his weight but his edema is reduced.  Is not having any new shortness of breath.  He is limited in his activities by bilateral knee pain and walks with crutches.  He is not having any new chest pressure, neck or arm discomfort.  Is not having any new palpitations, presyncope or syncope.  His biggest issue has been his panic attacks.  Is not having any symptomatic tachyarrhythmias and although he has some as needed 90 mg Cardizem he is not had to take it.   Past Medical History:  Diagnosis Date  . Anxiety   . Atrial fibrillation ([place])   . CHF (congestive heart  failure) ([place])   . Cirrhosis ([place])    NASH  . Constipation   . COPD (chronic obstructive pulmonary disease) ([place])   . Depression, recurrent ([place]) 07/18/2016  . GERD (gastroesophageal reflux disease)   . Gout   . Leukopenia 07/08/2015  . Neuromuscular disorder ([facility])    neuropathy in hands and feet  . Psoriasis   . RA (rheumatoid arthritis) ([place])   . Sleep apnea    cpap used- level 10 and greater  . Thrombocytopenia ([place])     Past Surgical History:  Procedure Laterality Date  . ANKLE SURGERY     right ankle talor repair  . CHOLECYSTECTOMY  sept 2016  . CHOLECYSTECTOMY    . COLONOSCOPY  05/08/2011   Procedure: COLONOSCOPY;  Surgeon: Rogene Houston, MD;  [facility];  Service: Endoscopy;  Laterality: N/A;  730  . ESOPHAGOGASTRODUODENOSCOPY  02/28/2011   Procedure: ESOPHAGOGASTRODUODENOSCOPY (EGD);  Surgeon: Rogene Houston, MD;  Location: [facility];  Service: Endoscopy;  Laterality: N/A;  1200  . ESOPHAGOGASTRODUODENOSCOPY N/A 06/11/2012   Procedure: ESOPHAGOGASTRODUODENOSCOPY (EGD);  Surgeon: Rogene Houston, MD;  Location: [facility];  Service: Endoscopy;  Laterality: N/A;  1200  FYI patient is 400 pounds  . ESOPHAGOGASTRODUODENOSCOPY (EGD) WITH PROPOFOL N/A 04/21/2014   Procedure: ESOPHAGOGASTRODUODENOSCOPY (EGD) WITH PROPOFOL;  Surgeon: Rogene Houston, MD;  Location: [facility] ORS;  Service: Endoscopy;  Laterality: N/A;  . HERNIA REPAIR Right    as child; inguinal  . HERNIA REPAIR  sept 2016  . LIVER BIOPSY  2012  . SCIATIC NERVE EXPLORATION    . TONSILLECTOMY       Current Outpatient Medications  Medication Sig Dispense Refill  . aspirin EC 81 MG tablet Take 81 mg by mouth as needed (CHEST PAIN).     Marland Kitchen baclofen (LIORESAL) 10 MG tablet TAKE 1 TABLET BY MOUTH THREE TIMES A DAY 30 tablet 3  . betamethasone dipropionate 0.05 % cream APPLY TO AFFECTED AREA TWICE A DAY (Patient taking differently: Apply 1 application topically daily as needed (itching).) 45 g 3  .  buprenorphine-naloxone (SUBOXONE) 8-2 mg SUBL SL tablet Place 0.5 tablets under the tongue 2 (two) times daily.    . busPIRone (BUSPAR) 10 MG tablet TAKE 1 TABLET BY MOUTH THREE TIMES A DAY 90 tablet 3  . colchicine 0.6 MG tablet Take 0.6 mg by mouth 2 (two) times daily as needed.     . [employer] FOLIC ACID 494 MCG tablet TAKE 1 TABLET BY MOUTH EVERY DAY 90 tablet 4  . diclofenac Sodium (VOLTAREN) 1 % GEL Apply 4 g topically 4 (four) times daily.    . DULoxetine (CYMBALTA) 60 MG capsule Take 60 mg by mouth at bedtime.     Marland Kitchen esomeprazole (NEXIUM) 40 MG capsule TAKE 1 CAPSULE BY MOUTH EVERY DAY 90 capsule 1  . ezetimibe (ZETIA) 10 MG tablet Take 1 tablet (10 mg total) by mouth daily. 30 tablet 5  . fluticasone (FLONASE) 50 MCG/ACT nasal spray USE 2 SPRAYS IN EACH NOSTRIL EVERY DAY AS NEEDED 48 mL 1  . gabapentin (NEURONTIN) 800 MG tablet Take 1 tablet (800 mg total) by mouth 3 (three) times daily. 270 tablet 3  . HYDROmorphone (DILAUDID) 2 MG tablet hydromorphone 2 mg tablet  TAKE 1 TABLET TWICE A DAY BY ORAL ROUTE AS NEEDED.    Marland Kitchen indomethacin (INDOCIN) 50 MG capsule Take 50 mg by mouth 3 (three) times daily as needed.    Marland Kitchen ipratropium (ATROVENT) 0.02 % nebulizer solution Take 0.5 mg by nebulization every 6 (six) hours as needed for wheezing or shortness of breath.     . lactulose (CHRONULAC) 10 GM/15ML solution TAKE 2 TABLESPOONS BY MOUTH TWICE DAILY 1892 mL 0  . lidocaine (XYLOCAINE) 5 % ointment Apply 1 application topically as needed. 35.44 g 0  . magnesium oxide (MAG-OX) 400 MG tablet TAKE 400 MG BY MOUTH DAILY. 30 tablet 1  . Menthol-Methyl Salicylate (MUSCLE RUB) 10-15 % CREA Apply 1 application topically 3 (three) times daily as needed for muscle pain. For muscle pain    . metolazone (ZAROXOLYN) 2.5 MG tablet Take 1 tablet (2.5 mg total) by mouth 3 (three) times a week. 30 tablet 1  . metoprolol tartrate (LOPRESSOR) 25 MG tablet Take 1 tablet (25 mg total) by mouth 2 (two) times daily. 180 tablet  3  . Multiple Vitamins-Minerals (MULTI COMPLETE PO) Take 1 tablet by mouth daily.     Marland Kitchen neomycin-bacitracin-polymyxin (NEOSPORIN) ointment Apply 1 application topically every 12 (twelve) hours as needed for wound care. Reported on 06/01/2015    . potassium chloride SA (KLOR-CON) 20 MEQ tablet Take 4 tablets (80 mEq total) by mouth daily. [name]  tablet 11  . pravastatin (PRAVACHOL) 10 MG tablet Take 1 tablet (10 mg total) by mouth daily. 90 tablet 3  . QUEtiapine (SEROQUEL) 100 MG tablet TAKE 1 TABLET BY MOUTH EVERYDAY AT BEDTIME 90 tablet 1  . torsemide (DEMADEX) 20 MG tablet Take 4 tablets (80 mg total) by mouth 2 (two) times daily. (Patient taking differently: Take 20 mg by mouth 3 (three) times daily.) 720 tablet 3  . buprenorphine (SUBUTEX) 2 MG SUBL SL tablet 2 mg. Place 2 tablets under tongue twice daily    . diltiazem (CARDIZEM SR) 90 MG 12 hr capsule Take 1 capsule (90 mg total) by mouth daily as needed. palpitations 30 capsule 3  . diltiazem (CARTIA XT) 300 MG 24 hr capsule TAKE ONE CAPSULE BY MOUTH EVERY DAY 90 capsule 3  . HYDROmorphone (DILAUDID) 2 MG tablet Take by mouth at bedtime.    Marland Kitchen HYDROmorphone (DILAUDID) 4 MG tablet Take 4 mg by mouth every 4 (four) hours as needed for moderate pain or severe pain.     Glory Rosebush Delica Lancets 25W MISC Test BS QID Dx E11.9 400 each 3   No current facility-administered medications for this visit.   Facility-Administered Medications Ordered in Other Visits  Medication Dose Route Frequency Provider Last Rate Last Admin  . 0.9 %  sodium chloride infusion   Intravenous Once Pennington, Rebekah M, PA-C      . ferumoxytol [facility]) 510 mg in sodium chloride 0.9 % 100 mL IVPB  510 mg Intravenous Once Pennington, Rebekah M, PA-C        Allergies:   Allopurinol    ROS:  Please see the history of present illness.   Otherwise, review of systems are positive for none .   All other systems are reviewed and negative.    PHYSICAL EXAM: VS:  BP (!)  160/80   Pulse 97   Ht 6' (1.829 m)   Wt (!) 355 lb (161 kg)   BMI 48.15 kg/m  , BMI Body mass index is 48.15 kg/m. GENERAL:  Well appearing NECK:  No jugular venous distention, waveform within normal limits, carotid upstroke brisk and symmetric, no bruits, no thyromegaly LUNGS:  Clear to auscultation bilaterally CHEST:  Unremarkable HEART:  PMI not displaced or sustained,S1 and S2 within normal limits, no S3, no S4, no clicks, no rubs, no murmurs ABD:  Flat, positive bowel sounds normal in frequency in pitch, no bruits, no rebound, no guarding, no midline pulsatile mass, no hepatomegaly, no splenomegaly EXT:  2 plus pulses throughout, no edema, no cyanosis no clubbing   EKG:  EKG is  ordered today. Sinus rhythm, rate 97, axis within normal limits, intervals within normal limits, poor anterior R wave progression, low voltage.   Recent Labs: 08/03/2019: Magnesium 1.3 10/14/2019: B Natriuretic Peptide 106.0 02/22/2020: ALT 15; BUN 14; Creatinine, Ser 1.06; Potassium 3.3; Sodium 136 03/16/2020: Hemoglobin 10.5; Platelets 51    Lipid Panel    Component Value Date/Time   CHOL 139 11/16/2019 1205   CHOL 97 05/05/2012 1331   TRIG 94 11/16/2019 1205   TRIG 175 (H) 01/09/2013 1228   TRIG 107 05/05/2012 1331   HDL 56 11/16/2019 1205   HDL 38 (L) 01/09/2013 1228   HDL 30 (L) 05/05/2012 1331   CHOLHDL 2.5 11/16/2019 1205   LDLCALC 65 11/16/2019 1205   LDLCALC 78 01/09/2013 1228   LDLCALC 46 05/05/2012 1331      Wt Readings from Last 3 Encounters:  04/06/20 (!) 355 lb (  161 kg)  02/22/20 (!) 390 lb (176.9 kg)  10/14/19 (!) 305 lb (138.3 kg)      Other studies Reviewed: Additional studies/ records that were reviewed today include: [facility] records Review of the above records demonstrates: As above   ASSESSMENT AND PLAN:  Chronic diastolic heart failure:    He seems to be euvolemic.  He is going to get his labs checked by primary provider.  No change in therapy.  I think  he seems to be on a good regimen as long as his potassium was okay.  Hypertension:    The blood pressure is at target.  No change in therapy.   Hyponatremia, hypokalemia, metabolic alkalosis: He was not hyponatremic and the potassium is as above.  Chronic alcoholic cirrhosis with pancytopenia:   He has significant but stable pancytopenia it is followed by hematology.   Type 2 diabetes mellitus:  A1C was 4.9 in November.  No change in therapy.   Dyslipidemia: LDL is 65.  No change in therapy.   Obesity class III:    We talked about this and he will continue to work on it. [facility] understands need to lose weight with diet  PAF:    He has PAF.   He is not an anticoagulation candidate with his multiple comorbidities.  He has had no symptomatic paroxysms.  No change in therapy.   Current medicines are reviewed at length with the patient today.  The patient does not have concerns regarding medicines.  The following changes have been made: None  Labs/ tests ordered today include: None  Orders Placed This Encounter  Procedures  . EKG 12-Lead     Disposition:   FU with me in 12 months   Signed, Minus Breeding, MD  04/06/2020 2:32 PM    [facility]

## 2020-04-05 NOTE — Progress Notes (Signed)
Feraheme given per orders. Patient tolerated it well without problems. Vitals stable and discharged home from clinic via wheelchair. Follow up as scheduled.

## 2020-04-05 NOTE — Patient Instructions (Signed)
[facility] at [facility]  Discharge Instructions:   _______________________________________________________________  Thank you for choosing [facility] at [facility] to provide your oncology and hematology care.  To afford each patient quality time with our providers, please arrive at least 15 minutes before your scheduled appointment.  You need to re-schedule your appointment if you arrive 10 or more minutes late.  We strive to give you quality time with our providers, and arriving late affects you and other patients whose appointments are after yours.  Also, if you no show three or more times for appointments you may be dismissed from the clinic.  Again, thank you for choosing  [facility] at [facility]. Our hope is that these requests will allow you access to exceptional care and in a timely manner. _______________________________________________________________  If you have questions after your visit, please contact our office at [phone] between the hours of 8:30 a.m. and 5:00 p.m. Voicemails left after 4:30 p.m. will not be returned until the following business day. _______________________________________________________________  For prescription refill requests, have your pharmacy contact our office. _______________________________________________________________  Recommendations made by the consultant and any test results will be sent to your referring physician. _______________________________________________________________ 

## 2020-04-06 ENCOUNTER — Other Ambulatory Visit: Payer: Self-pay | Admitting: Family Medicine

## 2020-04-06 ENCOUNTER — Ambulatory Visit (INDEPENDENT_AMBULATORY_CARE_PROVIDER_SITE_OTHER): Payer: Medicare Other | Admitting: Cardiology

## 2020-04-06 ENCOUNTER — Other Ambulatory Visit: Payer: Self-pay

## 2020-04-06 ENCOUNTER — Encounter: Payer: Self-pay | Admitting: Cardiology

## 2020-04-06 VITALS — BP 160/80 | HR 97 | Ht 72.0 in | Wt 355.0 lb

## 2020-04-06 DIAGNOSIS — I10 Essential (primary) hypertension: Secondary | ICD-10-CM | POA: Diagnosis not present

## 2020-04-06 DIAGNOSIS — I152 Hypertension secondary to endocrine disorders: Secondary | ICD-10-CM

## 2020-04-06 DIAGNOSIS — E118 Type 2 diabetes mellitus with unspecified complications: Secondary | ICD-10-CM | POA: Diagnosis not present

## 2020-04-06 DIAGNOSIS — I5032 Chronic diastolic (congestive) heart failure: Secondary | ICD-10-CM

## 2020-04-06 DIAGNOSIS — E785 Hyperlipidemia, unspecified: Secondary | ICD-10-CM | POA: Diagnosis not present

## 2020-04-06 DIAGNOSIS — E1159 Type 2 diabetes mellitus with other circulatory complications: Secondary | ICD-10-CM

## 2020-04-06 DIAGNOSIS — I480 Paroxysmal atrial fibrillation: Secondary | ICD-10-CM

## 2020-04-06 MED ORDER — DILTIAZEM HCL ER 90 MG PO CP12: 90.0000 mg | ORAL_CAPSULE | Freq: Every day | ORAL | 3 refills | Status: DC | PRN

## 2020-04-06 MED ORDER — POTASSIUM CHLORIDE CRYS ER 20 MEQ PO TBCR: 80.0000 meq | EXTENDED_RELEASE_TABLET | Freq: Every day | ORAL | 11 refills | Status: DC

## 2020-04-06 MED ORDER — DILTIAZEM HCL ER COATED BEADS 300 MG PO CP24: ORAL_CAPSULE | ORAL | 3 refills | Status: DC

## 2020-04-06 NOTE — Patient Instructions (Signed)
Medication Instructions:  The current medical regimen is effective;  continue present plan and medications.  *If you need a refill on your cardiac medications before your next appointment, please call your pharmacy*  Follow-Up: At [facility], you and your health needs are our priority.  As part of our continuing mission to provide you with exceptional heart care, we have created designated Provider Care Teams.  These Care Teams include your primary Cardiologist (physician) and Advanced Practice Providers (APPs -  Physician Assistants and Nurse Practitioners) who all work together to provide you with the care you need, when you need it.  We recommend signing up for the patient portal called "MyChart".  Sign up information is provided on this After Visit Summary.  MyChart is used to connect with patients for Virtual Visits (Telemedicine).  Patients are able to view lab/test results, encounter notes, upcoming appointments, etc.  Non-urgent messages can be sent to your provider as well.   To learn more about what you can do with MyChart, go to [email or web address].    Your next appointment:   12 month(s)  The format for your next appointment:   In Person  Provider:   Minus Breeding, MD   Thank you for choosing [facility]!!

## 2020-04-08 DIAGNOSIS — G894 Chronic pain syndrome: Secondary | ICD-10-CM | POA: Diagnosis not present

## 2020-04-08 DIAGNOSIS — E1159 Type 2 diabetes mellitus with other circulatory complications: Secondary | ICD-10-CM | POA: Diagnosis not present

## 2020-04-08 DIAGNOSIS — J449 Chronic obstructive pulmonary disease, unspecified: Secondary | ICD-10-CM | POA: Diagnosis not present

## 2020-04-08 DIAGNOSIS — I2510 Atherosclerotic heart disease of native coronary artery without angina pectoris: Secondary | ICD-10-CM | POA: Diagnosis not present

## 2020-04-08 DIAGNOSIS — M109 Gout, unspecified: Secondary | ICD-10-CM | POA: Diagnosis not present

## 2020-04-08 DIAGNOSIS — E876 Hypokalemia: Secondary | ICD-10-CM | POA: Diagnosis not present

## 2020-04-08 DIAGNOSIS — I4891 Unspecified atrial fibrillation: Secondary | ICD-10-CM | POA: Diagnosis not present

## 2020-04-08 DIAGNOSIS — I509 Heart failure, unspecified: Secondary | ICD-10-CM | POA: Diagnosis not present

## 2020-04-08 DIAGNOSIS — G4733 Obstructive sleep apnea (adult) (pediatric): Secondary | ICD-10-CM | POA: Diagnosis not present

## 2020-04-08 DIAGNOSIS — I110 Hypertensive heart disease with heart failure: Secondary | ICD-10-CM | POA: Diagnosis not present

## 2020-04-08 DIAGNOSIS — E785 Hyperlipidemia, unspecified: Secondary | ICD-10-CM | POA: Diagnosis not present

## 2020-04-08 DIAGNOSIS — K7460 Unspecified cirrhosis of liver: Secondary | ICD-10-CM | POA: Diagnosis not present

## 2020-04-12 ENCOUNTER — Ambulatory Visit (HOSPITAL_COMMUNITY): Payer: Medicare Other

## 2020-04-14 ENCOUNTER — Telehealth: Payer: Self-pay | Admitting: Cardiology

## 2020-04-14 NOTE — Telephone Encounter (Signed)
New Message:   Pt called and wanted Dr Percival Spanish to know that he saw his primary doctor and his Potassium was 4.7,

## 2020-04-14 NOTE — Telephone Encounter (Signed)
Returned call to patient-he saw Dr. Percival Spanish 3/30 and was suppose to get labs at PCP.   Reports labs completed and potassium normal.     Labs in care everywhere-advised will make MD aware and will call if any changes recommended.  Patient aware.

## 2020-04-15 NOTE — Telephone Encounter (Signed)
Noted! Thank you

## 2020-04-15 NOTE — Telephone Encounter (Signed)
Looks OK.

## 2020-04-18 ENCOUNTER — Ambulatory Visit (INDEPENDENT_AMBULATORY_CARE_PROVIDER_SITE_OTHER): Payer: Medicare HMO | Admitting: Gastroenterology

## 2020-04-18 ENCOUNTER — Inpatient Hospital Stay (HOSPITAL_COMMUNITY): Payer: Medicare Other | Attending: Hematology

## 2020-04-18 DIAGNOSIS — D509 Iron deficiency anemia, unspecified: Secondary | ICD-10-CM | POA: Insufficient documentation

## 2020-04-21 ENCOUNTER — Encounter (HOSPITAL_COMMUNITY): Payer: Self-pay

## 2020-04-21 ENCOUNTER — Inpatient Hospital Stay (HOSPITAL_COMMUNITY): Payer: Medicare Other

## 2020-04-21 ENCOUNTER — Other Ambulatory Visit: Payer: Self-pay

## 2020-04-21 VITALS — BP 115/59 | HR 56 | Temp 97.0°F | Resp 19

## 2020-04-21 DIAGNOSIS — D509 Iron deficiency anemia, unspecified: Secondary | ICD-10-CM | POA: Diagnosis not present

## 2020-04-21 MED ORDER — SODIUM CHLORIDE 0.9 % IV SOLN: Freq: Once | INTRAVENOUS | Status: AC

## 2020-04-21 MED ORDER — SODIUM CHLORIDE 0.9 % IV SOLN
510.0000 mg | Freq: Once | INTRAVENOUS | Status: AC
  Administered 2020-04-21: 510 mg via INTRAVENOUS
  Filled 2020-04-21: qty 510

## 2020-04-21 NOTE — Patient Instructions (Signed)
[place] at [facility]  Discharge Instructions:  You received your iron infusion today. Return as scheduled. _______________________________________________________________  Thank you for choosing [place] at [facility] to provide your oncology and hematology care.  To afford each patient quality time with our providers, please arrive at least 15 minutes before your scheduled appointment.  You need to re-schedule your appointment if you arrive 10 or more minutes late.  We strive to give you quality time with our providers, and arriving late affects you and other patients whose appointments are after yours.  Also, if you no show three or more times for appointments you may be dismissed from the clinic.  Again, thank you for choosing [place] at [place] hope is that these requests will allow you access to exceptional care and in a timely manner. _______________________________________________________________  If you have questions after your visit, please contact our office at [phone] between the hours of 8:30 a.m. and 5:00 p.m. Voicemails left after 4:30 p.m. will not be returned until the following business day. _______________________________________________________________  For prescription refill requests, have your pharmacy contact our office. _______________________________________________________________  Recommendations made by the consultant and any test results will be sent to your referring physician. _______________________________________________________________

## 2020-04-21 NOTE — Progress Notes (Signed)
Patient tolerated iron infusion with no complaints voiced. Peripheral IV site clean and dry with good blood return noted before and after infusion. Band aid applied. VSS with discharge and left in satisfactory condition with no s/s of distress noted.

## 2020-04-28 ENCOUNTER — Other Ambulatory Visit: Payer: Self-pay

## 2020-04-28 ENCOUNTER — Encounter (INDEPENDENT_AMBULATORY_CARE_PROVIDER_SITE_OTHER): Payer: Self-pay | Admitting: Gastroenterology

## 2020-04-28 ENCOUNTER — Other Ambulatory Visit: Payer: Self-pay | Admitting: Family Medicine

## 2020-04-28 ENCOUNTER — Other Ambulatory Visit (INDEPENDENT_AMBULATORY_CARE_PROVIDER_SITE_OTHER): Payer: Self-pay | Admitting: Family Medicine

## 2020-04-28 ENCOUNTER — Ambulatory Visit (INDEPENDENT_AMBULATORY_CARE_PROVIDER_SITE_OTHER): Payer: Medicare Other | Admitting: Gastroenterology

## 2020-04-28 VITALS — BP 127/77 | HR 90 | Temp 98.1°F | Ht 72.0 in | Wt 358.0 lb

## 2020-04-28 DIAGNOSIS — Z1159 Encounter for screening for other viral diseases: Secondary | ICD-10-CM | POA: Diagnosis not present

## 2020-04-28 DIAGNOSIS — K7030 Alcoholic cirrhosis of liver without ascites: Secondary | ICD-10-CM

## 2020-04-28 DIAGNOSIS — F1010 Alcohol abuse, uncomplicated: Secondary | ICD-10-CM | POA: Diagnosis not present

## 2020-04-28 DIAGNOSIS — I8510 Secondary esophageal varices without bleeding: Secondary | ICD-10-CM | POA: Diagnosis not present

## 2020-04-28 DIAGNOSIS — I5032 Chronic diastolic (congestive) heart failure: Secondary | ICD-10-CM

## 2020-04-28 DIAGNOSIS — I8500 Esophageal varices without bleeding: Secondary | ICD-10-CM | POA: Insufficient documentation

## 2020-04-28 DIAGNOSIS — Z86010 Personal history of colonic polyps: Secondary | ICD-10-CM | POA: Insufficient documentation

## 2020-04-28 MED ORDER — LACTULOSE 10 GM/15ML PO SOLN: 20.0000 g | Freq: Two times a day (BID) | ORAL | 5 refills | Status: DC
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
## 2020-04-28 NOTE — Patient Instructions (Addendum)
Alcohol cessation Perform blood workup Schedule EGD and colonoscopy Take lactulose, titrate for a goal of 2-3 bowel movements per day Continue with current diuretics - Reduce salt intake to <2 g per day - Can take Tylenol max of 2 g per day (650 mg q8h) for pain - Avoid NSAIDs for pain - Avoid eating raw oysters or shellfish - Ensure every night before going to sleep

## 2020-04-29 ENCOUNTER — Other Ambulatory Visit (INDEPENDENT_AMBULATORY_CARE_PROVIDER_SITE_OTHER): Payer: Self-pay

## 2020-04-29 ENCOUNTER — Encounter (INDEPENDENT_AMBULATORY_CARE_PROVIDER_SITE_OTHER): Payer: Self-pay

## 2020-04-29 ENCOUNTER — Other Ambulatory Visit (INDEPENDENT_AMBULATORY_CARE_PROVIDER_SITE_OTHER): Payer: Self-pay | Admitting: Gastroenterology

## 2020-04-29 ENCOUNTER — Telehealth (INDEPENDENT_AMBULATORY_CARE_PROVIDER_SITE_OTHER): Payer: Self-pay

## 2020-04-29 DIAGNOSIS — D696 Thrombocytopenia, unspecified: Secondary | ICD-10-CM

## 2020-04-29 DIAGNOSIS — Z1211 Encounter for screening for malignant neoplasm of colon: Secondary | ICD-10-CM

## 2020-04-29 DIAGNOSIS — I8510 Secondary esophageal varices without bleeding: Secondary | ICD-10-CM

## 2020-04-29 LAB — CBC WITH DIFFERENTIAL/PLATELET
Absolute Monocytes: 125 cells/uL — ABNORMAL LOW (ref 200–950)
Basophils Absolute: 10 cells/uL (ref 0–200)
Basophils Relative: 0.7 %
Eosinophils Absolute: 94 cells/uL (ref 15–500)
Eosinophils Relative: 6.7 %
HCT: 32.7 % — ABNORMAL LOW (ref 38.5–50.0)
Hemoglobin: 10.7 g/dL — ABNORMAL LOW (ref 13.2–17.1)
Lymphs Abs: 332 cells/uL — ABNORMAL LOW (ref 850–3900)
MCH: 27.7 pg (ref 27.0–33.0)
MCHC: 32.7 g/dL (ref 32.0–36.0)
MCV: 84.7 fL (ref 80.0–100.0)
MPV: 11.1 fL (ref 7.5–12.5)
Monocytes Relative: 8.9 %
Neutro Abs: 840 cells/uL — ABNORMAL LOW (ref 1500–7800)
Neutrophils Relative %: 60 %
Platelets: 42 10*3/uL — ABNORMAL LOW (ref 140–400)
RBC: 3.86 10*6/uL — ABNORMAL LOW (ref 4.20–5.80)
RDW: 16.1 % — ABNORMAL HIGH (ref 11.0–15.0)
Total Lymphocyte: 23.7 %
WBC: 1.4 10*3/uL — ABNORMAL LOW (ref 3.8–10.8)

## 2020-04-29 LAB — HEPATITIS A ANTIBODY, TOTAL: Hepatitis A AB,Total: NONREACTIVE

## 2020-04-29 LAB — COMPREHENSIVE METABOLIC PANEL
AG Ratio: 1.2 (calc) (ref 1.0–2.5)
ALT: 17 U/L (ref 9–46)
AST: 29 U/L (ref 10–35)
Albumin: 3.6 g/dL (ref 3.6–5.1)
Alkaline phosphatase (APISO): 93 U/L (ref 35–144)
BUN: 9 mg/dL (ref 7–25)
CO2: 27 mmol/L (ref 20–32)
Calcium: 8.9 mg/dL (ref 8.6–10.3)
Chloride: 104 mmol/L (ref 98–110)
Creat: 0.82 mg/dL (ref 0.70–1.33)
Globulin: 3.1 g/dL (calc) (ref 1.9–3.7)
Glucose, Bld: 111 mg/dL — ABNORMAL HIGH (ref 65–99)
Potassium: 4.1 mmol/L (ref 3.5–5.3)
Sodium: 139 mmol/L (ref 135–146)
Total Bilirubin: 0.8 mg/dL (ref 0.2–1.2)
Total Protein: 6.7 g/dL (ref 6.1–8.1)

## 2020-04-29 LAB — PROTIME-INR
INR: 1
Prothrombin Time: 10.5 s (ref 9.0–11.5)

## 2020-04-29 LAB — HEPATITIS B SURFACE ANTIBODY,QUALITATIVE: Hep B S Ab: NONREACTIVE

## 2020-04-29 LAB — AFP TUMOR MARKER: AFP-Tumor Marker: 5.7 ng/mL (ref <6.1)

## 2020-04-29 MED ORDER — SODIUM CHLORIDE 0.9% IV SOLUTION: Freq: Once | INTRAVENOUS | Status: DC

## 2020-04-29 MED ORDER — PEG 3350-KCL-NA BICARB-NACL 420 G PO SOLR: 4000.0000 mL | ORAL | 0 refills | Status: DC

## 2020-04-29 NOTE — Telephone Encounter (Signed)
LeighAnn Mose Colaizzi, CMA  

## 2020-04-29 NOTE — Progress Notes (Signed)
Order for platelets to be given the day of the procedure in preop  Patient should have type and screen checked in pre op evaluation.

## 2020-05-02 NOTE — Progress Notes (Signed)
Noted order has been placed

## 2020-05-09 DIAGNOSIS — G5712 Meralgia paresthetica, left lower limb: Secondary | ICD-10-CM | POA: Diagnosis not present

## 2020-05-09 DIAGNOSIS — G4733 Obstructive sleep apnea (adult) (pediatric): Secondary | ICD-10-CM | POA: Diagnosis not present

## 2020-05-09 DIAGNOSIS — Z79891 Long term (current) use of opiate analgesic: Secondary | ICD-10-CM | POA: Diagnosis not present

## 2020-05-09 DIAGNOSIS — G894 Chronic pain syndrome: Secondary | ICD-10-CM | POA: Diagnosis not present

## 2020-05-09 DIAGNOSIS — M130 Polyarthritis, unspecified: Secondary | ICD-10-CM | POA: Diagnosis not present

## 2020-05-09 DIAGNOSIS — M109 Gout, unspecified: Secondary | ICD-10-CM | POA: Diagnosis not present

## 2020-05-11 ENCOUNTER — Other Ambulatory Visit: Payer: Self-pay | Admitting: Family Medicine

## 2020-05-11 DIAGNOSIS — I5032 Chronic diastolic (congestive) heart failure: Secondary | ICD-10-CM

## 2020-05-23 ENCOUNTER — Ambulatory Visit: Payer: Medicare HMO | Admitting: Family Medicine

## 2020-05-24 NOTE — Patient Instructions (Signed)
[name]  05/24/2020     @PREFPERIOPPHARMACY @   Your procedure is scheduled on  05/27/2020.   Report to [facility] at  [phone]  A.M.   Call this number if you have problems the morning of surgery:  [phone]   Remember:  Follow the diet and prep instructions given to you by the office.                      Take these medicines the morning of surgery with A SIP OF WATER  Baclofen, suboxone, buspar, celebrex, colchicine, diltiazem, cartia XT, cymbalta, nexium, gabapentin, dilaudid (if needed), indocin, metoprolol.     Please brush your teeth.  Do not wear jewelry, make-up or nail polish.  Do not wear lotions, powders, or perfumes, or deodorant.  Do not shave 48 hours prior to surgery.  Men may shave face and neck.  Do not bring valuables to the hospital.  [facility] is not responsible for any belongings or valuables.  Contacts, dentures or bridgework may not be worn into surgery.  Leave your suitcase in the car.  After surgery it may be brought to your room.  For patients admitted to the hospital, discharge time will be determined by your treatment team.  Patients discharged the day of surgery will not be allowed to drive home and must have someone with them for 24 hours.   Special instructions:  DO NOT smoke tobacco or vape for 24 hours.  Please read over the following fact sheets that you were given. Anesthesia Post-op Instructions and Care and Recovery After Surgery       Upper Endoscopy, Adult, Care After This sheet gives you information about how to care for yourself after your procedure. Your health care provider may also give you more specific instructions. If you have problems or questions, contact your health care provider. What can I expect after the procedure? After the procedure, it is common to have:  A sore throat.  Mild stomach pain or discomfort.  Bloating.  Nausea. Follow these instructions at home:  Follow instructions  from your health care provider about what to eat or drink after your procedure.  Return to your normal activities as told by your health care provider. Ask your health care provider what activities are safe for you.  Take over-the-counter and prescription medicines only as told by your health care provider.  If you were given a sedative during the procedure, it can affect you for several hours. Do not drive or operate machinery until your health care provider says that it is safe.  Keep all follow-up visits as told by your health care provider. This is important.   Contact a health care provider if you have:  A sore throat that lasts longer than one day.  Trouble swallowing. Get help right away if:  You vomit blood or your vomit looks like coffee grounds.  You have: ? A fever. ? Bloody, black, or tarry stools. ? A severe sore throat or you cannot swallow. ? Difficulty breathing. ? Severe pain in your chest or abdomen. Summary  After the procedure, it is common to have a sore throat, mild stomach discomfort, bloating, and nausea.  If you were given a sedative during the procedure, it can affect you for several hours. Do not drive or operate machinery until your health care provider says that it is safe.  Follow instructions from your health care provider about  what to eat or drink after your procedure.  Return to your normal activities as told by your health care provider. This information is not intended to replace advice given to you by your health care provider. Make sure you discuss any questions you have with your health care provider. Document Revised: 12/23/2018 Document Reviewed: 05/27/2017 Elsevier Patient Education  2021 [place].  Colonoscopy, Adult, Care After This sheet gives you information about how to care for yourself after your procedure. Your health care provider may also give you more specific instructions. If you have problems or questions, contact your  health care provider. What can I expect after the procedure? After the procedure, it is common to have:  A small amount of blood in your stool for 24 hours after the procedure.  Some gas.  Mild cramping or bloating of your abdomen. Follow these instructions at home: Eating and drinking  Drink enough fluid to keep your urine pale yellow.  Follow instructions from your health care provider about eating or drinking restrictions.  Resume your normal diet as instructed by your health care provider. Avoid heavy or fried foods that are hard to digest.   Activity  Rest as told by your health care provider.  Avoid sitting for a long time without moving. Get up to take short walks every 1-2 hours. This is important to improve blood flow and breathing. Ask for help if you feel weak or unsteady.  Return to your normal activities as told by your health care provider. Ask your health care provider what activities are safe for you. Managing cramping and bloating  Try walking around when you have cramps or feel bloated.  Apply heat to your abdomen as told by your health care provider. Use the heat source that your health care provider recommends, such as a moist heat pack or a heating pad. ? Place a towel between your skin and the heat source. ? Leave the heat on for 20-30 minutes. ? Remove the heat if your skin turns bright red. This is especially important if you are unable to feel pain, heat, or cold. You may have a greater risk of getting burned.   General instructions  If you were given a sedative during the procedure, it can affect you for several hours. Do not drive or operate machinery until your health care provider says that it is safe.  For the first 24 hours after the procedure: ? Do not sign important documents. ? Do not drink alcohol. ? Do your regular daily activities at a slower pace than normal. ? Eat soft foods that are easy to digest.  Take over-the-counter and  prescription medicines only as told by your health care provider.  Keep all follow-up visits as told by your health care provider. This is important. Contact a health care provider if:  You have blood in your stool 2-3 days after the procedure. Get help right away if you have:  More than a small spotting of blood in your stool.  Large blood clots in your stool.  Swelling of your abdomen.  Nausea or vomiting.  A fever.  Increasing pain in your abdomen that is not relieved with medicine. Summary  After the procedure, it is common to have a small amount of blood in your stool. You may also have mild cramping and bloating of your abdomen.  If you were given a sedative during the procedure, it can affect you for several hours. Do not drive or operate machinery until your  health care provider says that it is safe.  Get help right away if you have a lot of blood in your stool, nausea or vomiting, a fever, or increased pain in your abdomen. This information is not intended to replace advice given to you by your health care provider. Make sure you discuss any questions you have with your health care provider. Document Revised: 12/19/2018 Document Reviewed: 07/21/2018 Elsevier Patient Education  2021 [place] After This sheet gives you information about how to care for yourself after your procedure. Your health care provider may also give you more specific instructions. If you have problems or questions, contact your health care provider. What can I expect after the procedure? After the procedure, it is common to have:  Tiredness.  Forgetfulness about what happened after the procedure.  Impaired judgment for important decisions.  Nausea or vomiting.  Some difficulty with balance. Follow these instructions at home: For the time period you were told by your health care provider:  Rest as needed.  Do not participate in activities where you  could fall or become injured.  Do not drive or use machinery.  Do not drink alcohol.  Do not take sleeping pills or medicines that cause drowsiness.  Do not make important decisions or sign legal documents.  Do not take care of children on your own.      Eating and drinking  Follow the diet that is recommended by your health care provider.  Drink enough fluid to keep your urine pale yellow.  If you vomit: ? Drink water, juice, or soup when you can drink without vomiting. ? Make sure you have little or no nausea before eating solid foods. General instructions  Have a responsible adult stay with you for the time you are told. It is important to have someone help care for you until you are awake and alert.  Take over-the-counter and prescription medicines only as told by your health care provider.  If you have sleep apnea, surgery and certain medicines can increase your risk for breathing problems. Follow instructions from your health care provider about wearing your sleep device: ? Anytime you are sleeping, including during daytime naps. ? While taking prescription pain medicines, sleeping medicines, or medicines that make you drowsy.  Avoid smoking.  Keep all follow-up visits as told by your health care provider. This is important. Contact a health care provider if:  You keep feeling nauseous or you keep vomiting.  You feel light-headed.  You are still sleepy or having trouble with balance after 24 hours.  You develop a rash.  You have a fever.  You have redness or swelling around the IV site. Get help right away if:  You have trouble breathing.  You have new-onset confusion at home. Summary  For several hours after your procedure, you may feel tired. You may also be forgetful and have poor judgment.  Have a responsible adult stay with you for the time you are told. It is important to have someone help care for you until you are awake and alert.  Rest as  told. Do not drive or operate machinery. Do not drink alcohol or take sleeping pills.  Get help right away if you have trouble breathing, or if you suddenly become confused. This information is not intended to replace advice given to you by your health care provider. Make sure you discuss any questions you have with your health care provider. Document Revised: 09/10/2019 Document Reviewed: 11/27/2018  Elsevier Patient Education  2021 [employer].

## 2020-05-25 ENCOUNTER — Other Ambulatory Visit (HOSPITAL_COMMUNITY)
Admission: RE | Admit: 2020-05-25 | Discharge: 2020-05-25 | Disposition: A | Discharge status: Home / Self Care | Payer: Medicare Other | Source: Ambulatory Visit | Attending: Gastroenterology | Admitting: Gastroenterology

## 2020-05-25 ENCOUNTER — Encounter (HOSPITAL_COMMUNITY): Payer: Self-pay

## 2020-05-25 ENCOUNTER — Encounter (HOSPITAL_COMMUNITY)
Admission: RE | Admit: 2020-05-25 | Discharge: 2020-05-25 | Disposition: A | Discharge status: Home / Self Care | Payer: Medicare Other | Source: Ambulatory Visit | Attending: Gastroenterology | Admitting: Gastroenterology

## 2020-05-25 ENCOUNTER — Ambulatory Visit: Payer: Self-pay | Admitting: *Deleted

## 2020-05-25 ENCOUNTER — Other Ambulatory Visit: Payer: Self-pay

## 2020-05-25 DIAGNOSIS — Z20822 Contact with and (suspected) exposure to covid-19: Secondary | ICD-10-CM | POA: Diagnosis not present

## 2020-05-25 DIAGNOSIS — Z01812 Encounter for preprocedural laboratory examination: Secondary | ICD-10-CM | POA: Insufficient documentation

## 2020-05-25 DIAGNOSIS — I8510 Secondary esophageal varices without bleeding: Secondary | ICD-10-CM

## 2020-05-25 LAB — CBC WITH DIFFERENTIAL/PLATELET
Abs Immature Granulocytes: 0.01 10*3/uL (ref 0.00–0.07)
Basophils Absolute: 0 10*3/uL (ref 0.0–0.1)
Basophils Relative: 0 %
Eosinophils Absolute: 0 10*3/uL (ref 0.0–0.5)
Eosinophils Relative: 1 %
HCT: 36.8 % — ABNORMAL LOW (ref 39.0–52.0)
Hemoglobin: 12.3 g/dL — ABNORMAL LOW (ref 13.0–17.0)
Immature Granulocytes: 1 %
Lymphocytes Relative: 23 %
Lymphs Abs: 0.4 10*3/uL — ABNORMAL LOW (ref 0.7–4.0)
MCH: 28.5 pg (ref 26.0–34.0)
MCHC: 33.4 g/dL (ref 30.0–36.0)
MCV: 85.4 fL (ref 80.0–100.0)
Monocytes Absolute: 0.1 10*3/uL (ref 0.1–1.0)
Monocytes Relative: 6 %
Neutro Abs: 1.2 10*3/uL — ABNORMAL LOW (ref 1.7–7.7)
Neutrophils Relative %: 69 %
Platelets: 40 10*3/uL — ABNORMAL LOW (ref 150–400)
RBC: 4.31 MIL/uL (ref 4.22–5.81)
RDW: 15.7 % — ABNORMAL HIGH (ref 11.5–15.5)
WBC: 1.6 10*3/uL — ABNORMAL LOW (ref 4.0–10.5)
nRBC: 0 % (ref 0.0–0.2)

## 2020-05-25 LAB — PROTIME-INR
INR: 1.2 (ref 0.8–1.2)
Prothrombin Time: 15 seconds (ref 11.4–15.2)

## 2020-05-25 LAB — COMPREHENSIVE METABOLIC PANEL
ALT: 19 U/L (ref 0–44)
AST: 33 U/L (ref 15–41)
Albumin: 3.7 g/dL (ref 3.5–5.0)
Alkaline Phosphatase: 92 U/L (ref 38–126)
Anion gap: 10 (ref 5–15)
BUN: 20 mg/dL (ref 6–20)
CO2: 31 mmol/L (ref 22–32)
Calcium: 8.9 mg/dL (ref 8.9–10.3)
Chloride: 96 mmol/L — ABNORMAL LOW (ref 98–111)
Creatinine, Ser: 0.89 mg/dL (ref 0.61–1.24)
GFR, Estimated: >60 mL/min (ref >60)
Glucose, Bld: 181 mg/dL — ABNORMAL HIGH (ref 70–99)
Potassium: 3.1 mmol/L — ABNORMAL LOW (ref 3.5–5.1)
Sodium: 137 mmol/L (ref 135–145)
Total Bilirubin: 1.2 mg/dL (ref 0.3–1.2)
Total Protein: 7.4 g/dL (ref 6.5–8.1)

## 2020-05-25 LAB — SARS CORONAVIRUS 2 (TAT 6-24 HRS): SARS Coronavirus 2: NEGATIVE

## 2020-05-25 NOTE — Telephone Encounter (Signed)
[phone] Pt called requesting to discuss his lab results, reports that his PCP's office is closed and simply has clarification questions regarding the labs that he says  Patient requesting to review recent lab results. Reviewed lab results with patient and patient verbalized understanding . Advised patient to contact PCP for any details regarding up coming procedure. Patient verbalized understanding .

## 2020-05-26 ENCOUNTER — Other Ambulatory Visit (INDEPENDENT_AMBULATORY_CARE_PROVIDER_SITE_OTHER): Payer: Self-pay | Admitting: Gastroenterology

## 2020-05-26 MED ORDER — SPIRONOLACTONE 50 MG PO TABS: 50.0000 mg | ORAL_TABLET | Freq: Every day | ORAL | 3 refills | Status: DC

## 2020-05-26 NOTE — Progress Notes (Signed)
Talked with Lovena Le in blood bank. Notified will need platelets in AM at 0615 for transfusion before procedure. Order released. Voiced understanding.

## 2020-05-27 ENCOUNTER — Encounter (HOSPITAL_COMMUNITY)
Admission: RE | Disposition: A | Discharge status: Home / Self Care | Payer: Self-pay | Source: Home / Self Care | Attending: Gastroenterology

## 2020-05-27 ENCOUNTER — Encounter (HOSPITAL_COMMUNITY): Payer: Self-pay | Admitting: Gastroenterology

## 2020-05-27 ENCOUNTER — Other Ambulatory Visit (INDEPENDENT_AMBULATORY_CARE_PROVIDER_SITE_OTHER): Payer: Self-pay | Admitting: Gastroenterology

## 2020-05-27 ENCOUNTER — Telehealth (INDEPENDENT_AMBULATORY_CARE_PROVIDER_SITE_OTHER): Payer: Self-pay | Admitting: *Deleted

## 2020-05-27 ENCOUNTER — Other Ambulatory Visit (INDEPENDENT_AMBULATORY_CARE_PROVIDER_SITE_OTHER): Payer: Self-pay | Admitting: *Deleted

## 2020-05-27 ENCOUNTER — Ambulatory Visit (HOSPITAL_COMMUNITY)
Admission: RE | Admit: 2020-05-27 | Discharge: 2020-05-27 | Disposition: A | Discharge status: Home / Self Care | Payer: Medicare Other | Attending: Gastroenterology | Admitting: Gastroenterology

## 2020-05-27 ENCOUNTER — Encounter (INDEPENDENT_AMBULATORY_CARE_PROVIDER_SITE_OTHER): Payer: Self-pay | Admitting: *Deleted

## 2020-05-27 ENCOUNTER — Ambulatory Visit (HOSPITAL_COMMUNITY): Payer: Medicare Other | Admitting: Certified Registered Nurse Anesthetist

## 2020-05-27 DIAGNOSIS — Q438 Other specified congenital malformations of intestine: Secondary | ICD-10-CM

## 2020-05-27 DIAGNOSIS — K5520 Angiodysplasia of colon without hemorrhage: Secondary | ICD-10-CM | POA: Insufficient documentation

## 2020-05-27 DIAGNOSIS — I110 Hypertensive heart disease with heart failure: Secondary | ICD-10-CM | POA: Diagnosis not present

## 2020-05-27 DIAGNOSIS — D509 Iron deficiency anemia, unspecified: Secondary | ICD-10-CM | POA: Diagnosis not present

## 2020-05-27 DIAGNOSIS — J449 Chronic obstructive pulmonary disease, unspecified: Secondary | ICD-10-CM | POA: Insufficient documentation

## 2020-05-27 DIAGNOSIS — I4891 Unspecified atrial fibrillation: Secondary | ICD-10-CM | POA: Insufficient documentation

## 2020-05-27 DIAGNOSIS — K766 Portal hypertension: Secondary | ICD-10-CM

## 2020-05-27 DIAGNOSIS — M069 Rheumatoid arthritis, unspecified: Secondary | ICD-10-CM | POA: Insufficient documentation

## 2020-05-27 DIAGNOSIS — K219 Gastro-esophageal reflux disease without esophagitis: Secondary | ICD-10-CM | POA: Insufficient documentation

## 2020-05-27 DIAGNOSIS — Z79899 Other long term (current) drug therapy: Secondary | ICD-10-CM | POA: Insufficient documentation

## 2020-05-27 DIAGNOSIS — Z1211 Encounter for screening for malignant neoplasm of colon: Secondary | ICD-10-CM | POA: Diagnosis not present

## 2020-05-27 DIAGNOSIS — I509 Heart failure, unspecified: Secondary | ICD-10-CM | POA: Diagnosis not present

## 2020-05-27 DIAGNOSIS — Z86010 Personal history of colonic polyps: Secondary | ICD-10-CM | POA: Diagnosis not present

## 2020-05-27 DIAGNOSIS — Z809 Family history of malignant neoplasm, unspecified: Secondary | ICD-10-CM | POA: Insufficient documentation

## 2020-05-27 DIAGNOSIS — K7460 Unspecified cirrhosis of liver: Secondary | ICD-10-CM

## 2020-05-27 DIAGNOSIS — I8510 Secondary esophageal varices without bleeding: Secondary | ICD-10-CM | POA: Diagnosis not present

## 2020-05-27 DIAGNOSIS — K648 Other hemorrhoids: Secondary | ICD-10-CM | POA: Insufficient documentation

## 2020-05-27 DIAGNOSIS — K3189 Other diseases of stomach and duodenum: Secondary | ICD-10-CM

## 2020-05-27 DIAGNOSIS — G629 Polyneuropathy, unspecified: Secondary | ICD-10-CM | POA: Diagnosis not present

## 2020-05-27 DIAGNOSIS — G4733 Obstructive sleep apnea (adult) (pediatric): Secondary | ICD-10-CM | POA: Diagnosis not present

## 2020-05-27 DIAGNOSIS — Z791 Long term (current) use of non-steroidal anti-inflammatories (NSAID): Secondary | ICD-10-CM | POA: Insufficient documentation

## 2020-05-27 DIAGNOSIS — K5730 Diverticulosis of large intestine without perforation or abscess without bleeding: Secondary | ICD-10-CM

## 2020-05-27 DIAGNOSIS — Z808 Family history of malignant neoplasm of other organs or systems: Secondary | ICD-10-CM | POA: Insufficient documentation

## 2020-05-27 DIAGNOSIS — Z800 Family history of malignant neoplasm of digestive organs: Secondary | ICD-10-CM | POA: Insufficient documentation

## 2020-05-27 DIAGNOSIS — Z09 Encounter for follow-up examination after completed treatment for conditions other than malignant neoplasm: Secondary | ICD-10-CM | POA: Diagnosis not present

## 2020-05-27 DIAGNOSIS — I81 Portal vein thrombosis: Secondary | ICD-10-CM | POA: Diagnosis not present

## 2020-05-27 DIAGNOSIS — K31819 Angiodysplasia of stomach and duodenum without bleeding: Secondary | ICD-10-CM | POA: Diagnosis not present

## 2020-05-27 DIAGNOSIS — L409 Psoriasis, unspecified: Secondary | ICD-10-CM | POA: Insufficient documentation

## 2020-05-27 DIAGNOSIS — K7030 Alcoholic cirrhosis of liver without ascites: Secondary | ICD-10-CM | POA: Diagnosis not present

## 2020-05-27 DIAGNOSIS — D731 Hypersplenism: Secondary | ICD-10-CM | POA: Insufficient documentation

## 2020-05-27 DIAGNOSIS — Z7951 Long term (current) use of inhaled steroids: Secondary | ICD-10-CM | POA: Insufficient documentation

## 2020-05-27 DIAGNOSIS — K7040 Alcoholic hepatic failure without coma: Secondary | ICD-10-CM | POA: Diagnosis not present

## 2020-05-27 DIAGNOSIS — M109 Gout, unspecified: Secondary | ICD-10-CM | POA: Insufficient documentation

## 2020-05-27 DIAGNOSIS — I8500 Esophageal varices without bleeding: Secondary | ICD-10-CM | POA: Diagnosis not present

## 2020-05-27 DIAGNOSIS — I2510 Atherosclerotic heart disease of native coronary artery without angina pectoris: Secondary | ICD-10-CM | POA: Diagnosis not present

## 2020-05-27 DIAGNOSIS — Z888 Allergy status to other drugs, medicaments and biological substances status: Secondary | ICD-10-CM | POA: Insufficient documentation

## 2020-05-27 HISTORY — PX: ESOPHAGEAL BANDING: SHX5518

## 2020-05-27 HISTORY — PX: COLONOSCOPY WITH PROPOFOL: SHX5780

## 2020-05-27 HISTORY — PX: ESOPHAGOGASTRODUODENOSCOPY (EGD) WITH PROPOFOL: SHX5813

## 2020-05-27 LAB — GLUCOSE, CAPILLARY: Glucose-Capillary: 162 mg/dL — ABNORMAL HIGH (ref 70–99)

## 2020-05-27 LAB — HM COLONOSCOPY

## 2020-05-27 LAB — ABO/RH: ABO/RH(D): A POS

## 2020-05-27 SURGERY — ESOPHAGOGASTRODUODENOSCOPY (EGD) WITH PROPOFOL
Anesthesia: General

## 2020-05-27 MED ORDER — SODIUM CHLORIDE 0.9 % IV SOLN: INTRAVENOUS | Status: DC

## 2020-05-27 MED ORDER — LACTATED RINGERS IV SOLN: INTRAVENOUS | Status: DC

## 2020-05-27 MED ORDER — LIDOCAINE HCL (CARDIAC) PF 100 MG/5ML IV SOSY
PREFILLED_SYRINGE | INTRAVENOUS | Status: DC | PRN
  Administered 2020-05-27: 50 mg via INTRAVENOUS

## 2020-05-27 MED ORDER — MIDAZOLAM HCL 2 MG/2ML IJ SOLN
INTRAMUSCULAR | Status: DC | PRN
  Administered 2020-05-27: 2 mg via INTRAVENOUS

## 2020-05-27 MED ORDER — PROPOFOL 500 MG/50ML IV EMUL
INTRAVENOUS | Status: DC | PRN
  Administered 2020-05-27: 150 ug/kg/min via INTRAVENOUS

## 2020-05-27 MED ORDER — PROPOFOL 10 MG/ML IV BOLUS
INTRAVENOUS | Status: DC | PRN
  Administered 2020-05-27: 100 mg via INTRAVENOUS

## 2020-05-27 MED ORDER — MIDAZOLAM HCL 2 MG/2ML IJ SOLN
INTRAMUSCULAR | Status: AC
  Filled 2020-05-27: qty 2

## 2020-05-27 NOTE — Op Note (Signed)
[facility] Patient Name: [name] Procedure Date: 05/27/2020 7:12 AM MRN: [id number] Date of Birth: November 17, 1965 Attending MD: [name] ,  CSN: [id number] Age: 55 Admit Type: Outpatient Procedure:                Upper GI endoscopy Indications:              Follow-up of esophageal varices Providers:                [name], Janeece Riggers, RN, Nelma Rothman,                            Technician Referring MD:              Medicines:                Monitored Anesthesia Care Complications:            No immediate complications. Estimated Blood Loss:      Procedure:                Pre-Anesthesia Assessment:                           - Prior to the procedure, a History and Physical                            was performed, and patient medications, allergies                            and sensitivities were reviewed. The patient's                            tolerance of previous anesthesia was reviewed.                           - The risks and benefits of the procedure and the                            sedation options and risks were discussed with the                            patient. All questions were answered and informed                            consent was obtained.                           - ASA Grade Assessment: III - A patient with severe                            systemic disease.                           After obtaining informed consent, the endoscope was                            passed under direct vision. Throughout the  procedure, the patient's blood pressure, pulse, and                            oxygen saturations were monitored continuously. The                            [id number]-[id number] ([id number]) scope was introduced through the                            mouth, and advanced to the second part of duodenum.                            The upper GI endoscopy was accomplished without                            difficulty. The  patient tolerated the procedure                            well. Scope In: 7:40:53 AM Scope Out: 7:52:25 AM Total Procedure Duration: 0 hours 11 minutes 32 seconds  Findings:      One column of grade II varices with no stigmata of recent bleeding were       found in the lower third of the esophagus,. No red wale signs were       present. One band was successfully placed with complete eradication,       resulting in deflation of varices. There was no bleeding during the       procedure.      Nodular gastric antral vascular ectasia without bleeding was present in       the gastric antrum.      Diffuse portal hypertensive gastropathy was found in the entire examined       stomach. No gastric varices were seen.      The examined duodenum was normal. Impression:               - Grade II esophageal varices with no stigmata of                            recent bleeding. Completely eradicated. Banded.                           - Gastric antral vascular ectasia without bleeding.                           - Portal hypertensive gastropathy.                           - Normal examined duodenum.                           - No specimens collected. Moderate Sedation:      Per Anesthesia Care Recommendation:           - Discharge patient to home (ambulatory).                           - Resume previous diet.                           -  Repeat upper endoscopy in 8 weeks for                            surveillance, will need one unit of platelets on                            hold prior to procedure. Procedure Code(s):        --- Professional ---                           [phone], Esophagogastroduodenoscopy, flexible,                            transoral; with band ligation of esophageal/gastric                            varices Diagnosis Code(s):        --- Professional ---                           I85.00, Esophageal varices without bleeding                           K31.819, Angiodysplasia of  stomach and duodenum                            without bleeding                           K76.6, Portal hypertension                           K31.89, Other diseases of stomach and duodenum CPT copyright 2019 [employer]. All rights reserved. The codes documented in this report are preliminary and upon coder review may  be revised to meet current compliance requirements. [name], MD [name],  05/27/2020 8:32:53 AM This report has been signed electronically. Number of Addenda: 0

## 2020-05-27 NOTE — Progress Notes (Signed)
BMP to be performed in 2 weeks, patient advised to stop potassium and start aldactone

## 2020-05-27 NOTE — Op Note (Incomplete)
Platelets stopped at 0740 B/pP 134/76, pulse 74, o2 saturation 94%. Patient tolerated well

## 2020-05-27 NOTE — Op Note (Signed)
[facility] Patient Name: [name] Procedure Date: 05/27/2020 7:55 AM MRN: [id number] Date of Birth: [birth year] Attending MD: [name] ,  CSN: [id number] Age: 55 Admit Type: Outpatient Procedure:                Colonoscopy Indications:              High risk colon cancer surveillance: Personal                            history of colonic polyps Providers:                [name], Janeece Riggers, RN, Nelma Rothman,                            Technician Referring MD:              Medicines:                Monitored Anesthesia Care Complications:            No immediate complications. Estimated Blood Loss:     Estimated blood loss: none. Procedure:                Pre-Anesthesia Assessment:                           - Prior to the procedure, a History and Physical                            was performed, and patient medications, allergies                            and sensitivities were reviewed. The patient's                            tolerance of previous anesthesia was reviewed.                           - The risks and benefits of the procedure and the                            sedation options and risks were discussed with the                            patient. All questions were answered and informed                            consent was obtained.                           - ASA Grade Assessment: III - A patient with severe                            systemic disease.                           After obtaining informed consent, the colonoscope  was passed under direct vision. Throughout the                            procedure, the patient's blood pressure, pulse, and                            oxygen saturations were monitored continuously. The                            [id number]-HQ190L ([id number]) scope was introduced through                            the anus and advanced to the the cecum, identified                            by  appendiceal orifice and ileocecal valve. The                            colonoscopy was technically difficult and complex                            due to the patient's oxygen desaturation, which                            improved with oxygen mask. The patient tolerated                            the procedure well. The quality of the bowel                            preparation was adequate to identify polyps 6 mm                            and larger in size, thorough lavage was performed. Scope In: 8:00:25 AM Scope Out: 8:29:52 AM Scope Withdrawal Time: 0 hours 15 minutes 10 seconds  Total Procedure Duration: 0 hours 29 minutes 27 seconds  Findings:      The perianal and digital rectal examinations were normal.      The ascending colon was moderately redundant.      Two medium-sized localized angiodysplastic lesions without bleeding were       found in the transverse colon and in the cecum.      Multiple small and large-mouthed diverticula were found in the sigmoid       colon and descending colon.      Non-bleeding internal hemorrhoids were found during retroflexion. The       hemorrhoids were medium-sized. Impression:               - Redundant colon.                           - Two non-bleeding colonic angiodysplastic lesions.                           - Diverticulosis in the sigmoid colon and in the  descending colon.                           - Non-bleeding internal hemorrhoids.                           - No specimens collected. Moderate Sedation:      Per Anesthesia Care Recommendation:           - Discharge patient to home (ambulatory).                           - Resume previous diet.                           - Repeat colonoscopy in 10 years for screening                            purposes.                           - Restart aspirin today. Procedure Code(s):        --- Professional ---                           Z6010, Colorectal cancer  screening; colonoscopy on                            individual at high risk Diagnosis Code(s):        --- Professional ---                           Z86.010, Personal history of colonic polyps                           K55.20, Angiodysplasia of colon without hemorrhage                           K64.8, Other hemorrhoids                           K57.30, Diverticulosis of large intestine without                            perforation or abscess without bleeding                           Q43.8, Other specified congenital malformations of                            intestine CPT copyright 2019 American Medical Association. All rights reserved. The codes documented in this report are preliminary and upon coder review may  be revised to meet current compliance requirements. [name], MD [name],  05/27/2020 8:37:49 AM This report has been signed electronically. Number of Addenda: 0

## 2020-05-27 NOTE — Anesthesia Preprocedure Evaluation (Signed)
Anesthesia Evaluation  Patient identified by MRN, date of birth, ID band Patient awake    Reviewed: Allergy & Precautions, H&P , NPO status , Patient's Chart, lab work & pertinent test results, reviewed documented beta blocker date and time   Airway Mallampati: II  TM Distance: >3 FB Neck ROM: full    Dental no notable dental hx.    Pulmonary neg pulmonary ROS,    Pulmonary exam normal breath sounds clear to auscultation       Cardiovascular Exercise Tolerance: Good hypertension, + CAD and +CHF  + dysrhythmias Atrial Fibrillation  Rhythm:irregular Rate:Normal     Neuro/Psych  Headaches, PSYCHIATRIC DISORDERS Anxiety Depression  Neuromuscular disease    GI/Hepatic GERD  Medicated,(+) Cirrhosis   Esophageal Varices    , Autoimmune  Endo/Other  negative endocrine ROS  Renal/GU negative Renal ROS  negative genitourinary   Musculoskeletal   Abdominal   Peds  Hematology  (+) Blood dyscrasia, anemia ,   Anesthesia Other Findings   Reproductive/Obstetrics negative OB ROS                             Anesthesia Physical Anesthesia Plan  ASA: III  Anesthesia Plan: General   Post-op Pain Management:    Induction:   PONV Risk Score and Plan: Propofol infusion  Airway Management Planned:   Additional Equipment:   Intra-op Plan:   Post-operative Plan:   Informed Consent: I have reviewed the patients History and Physical, chart, labs and discussed the procedure including the risks, benefits and alternatives for the proposed anesthesia with the patient or authorized representative who has indicated his/her understanding and acceptance.     Dental Advisory Given  Plan Discussed with: CRNA  Anesthesia Plan Comments:         Anesthesia Quick Evaluation

## 2020-05-27 NOTE — Transfer of Care (Signed)
Immediate Anesthesia Transfer of Care Note  Patient: [name]  Procedure(s) Performed: ESOPHAGOGASTRODUODENOSCOPY (EGD) WITH PROPOFOL (N/A ) ESOPHAGEAL BANDING (N/A ) COLONOSCOPY WITH PROPOFOL (N/A )  Patient Location: PACU  Anesthesia Type:General  Level of Consciousness: awake and alert   Airway & Oxygen Therapy: Patient Spontanous Breathing and Patient connected to nasal cannula oxygen  Post-op Assessment: Report given to RN and Post -op Vital signs reviewed and stable  Post vital signs: Reviewed and stable  Last Vitals:  Vitals Value Taken Time  BP 129/80 05/27/20 0835  Temp    Pulse 88 05/27/20 0839  Resp 15 05/27/20 0839  SpO2 99 % 05/27/20 0839  Vitals shown include unvalidated device data.  Last Pain:  Vitals:   05/27/20 0645  TempSrc: Oral  PainSc: 0-No pain         Complications: No complications documented.

## 2020-05-27 NOTE — Telephone Encounter (Signed)
Per EGD op note - Repeat upper endoscopy in 8 weeks for surveillance, will need one unit of platelets on hold prior to procedure

## 2020-05-27 NOTE — Progress Notes (Signed)
Order has been completed. Faxed to [facility], and a letter sent to the patient as a reminder.

## 2020-05-27 NOTE — Anesthesia Postprocedure Evaluation (Signed)
Anesthesia Post Note  Patient: [name]  Procedure(s) Performed: ESOPHAGOGASTRODUODENOSCOPY (EGD) WITH PROPOFOL (N/A ) ESOPHAGEAL BANDING (N/A ) COLONOSCOPY WITH PROPOFOL (N/A )  Patient location during evaluation: PACU Anesthesia Type: General Level of consciousness: awake and alert Pain management: pain level controlled Vital Signs Assessment: post-procedure vital signs reviewed and stable Respiratory status: spontaneous breathing, nonlabored ventilation, respiratory function stable and patient connected to nasal cannula oxygen Cardiovascular status: blood pressure returned to baseline and stable Postop Assessment: no apparent nausea or vomiting Anesthetic complications: no   No complications documented.   Last Vitals:  Vitals:   05/27/20 0835 05/27/20 0845  BP: 129/80 140/84  Pulse: 99 85  Resp: (!) 21 15  Temp: (!) 36.4 C   SpO2: 99% 100%    Last Pain:  Vitals:   05/27/20 0835  TempSrc:   PainSc: 0-No pain                 Louann Sjogren

## 2020-05-27 NOTE — Interval H&P Note (Signed)
History and Physical Interval Note:  05/27/2020 7:33 AM BURKE TERRY is a 55 y.o. male medical history of alcoholic cirrhosis complicated by hepatic encephalopathy, nonbleeding grade 1 esophageal varices, pancytopenia due to hypersplenism, atrial fibrillation, congestive heart failure, rheumatoid arthritis, OSA on CPAP, psoriasis, gout, depression, GERD, COPD, who presents for follow-up of esophageal varices and history of colon polyps.  Patient denies having any complaints.  He denies any hematemesis, melena or hematochezia.  Most recent labs showed a platelet count of 40,000, for which he received 1 unit of platelets today in the morning.  Most recent EGD performed on 2016 showed 2 columns of grade 1 esophageal varices and portal hypertensive gastropathy.  Last colonoscopy was performed in 2013, was found to have 2 small polyps, 1 was a TA.  BP (!) 156/88   Pulse 74   Temp 98.4 F (36.9 C) (Oral)   Resp 13   SpO2 98%  GENERAL: The patient is AO x3, in no acute distress. Obese. HEENT: Head is normocephalic and atraumatic. EOMI are intact. Mouth is well hydrated and without lesions. NECK: Supple. No masses LUNGS: Clear to auscultation. No presence of rhonchi/wheezing/rales. Adequate chest expansion HEART: RRR, normal s1 and s2. ABDOMEN: Soft, nontender, no guarding, no peritoneal signs, and nondistended. BS +. No masses. EXTREMITIES: Without any cyanosis, clubbing, rash, lesions or edema. NEUROLOGIC: AOx3, no focal motor deficit. SKIN: no jaundice, no rashes    Silvia D Gertz  has presented today for surgery, with the diagnosis of Esophageal Varices History of colon polyps.  The various methods of treatment have been discussed with the patient and family. After consideration of risks, benefits and other options for treatment, the patient has consented to  Procedure(s) with comments: ESOPHAGOGASTRODUODENOSCOPY (EGD) WITH PROPOFOL (N/A) - 7:30 am ESOPHAGEAL BANDING (N/A) COLONOSCOPY  WITH PROPOFOL (N/A) - Patient needs a unit of platelets prior to procedure. as a surgical intervention.  The patient's history has been reviewed, patient examined, no change in status, stable for surgery.  I have reviewed the patient's chart and labs.  Questions were answered to the patient's satisfaction.     [name] Mayorga

## 2020-05-27 NOTE — Discharge Instructions (Signed)
You are being discharged to home.  Resume your previous diet.  Your physician has recommended a repeat upper endoscopy in eight weeks for surveillance. Your physician has recommended a repeat colonoscopy in 10 years for screening purposes.  Restart aspirin today.  Diverticulosis  Diverticulosis is a condition that develops when small pouches (diverticula) form in the wall of the large intestine (colon). The colon is where water is absorbed and stool (feces) is formed. The pouches form when the inside layer of the colon pushes through weak spots in the outer layers of the colon. You may have a few pouches or many of them. The pouches usually do not cause problems unless they become inflamed or infected. When this happens, the condition is called diverticulitis. What are the causes? The cause of this condition is not known. What increases the risk? The following factors may make you more likely to develop this condition:  Being older than age 64. Your risk for this condition increases with age. Diverticulosis is rare among people younger than age 28. By age 11, many people have it.  Eating a low-fiber diet.  Having frequent constipation.  Being overweight.  Not getting enough exercise.  Smoking.  Taking over-the-counter pain medicines, like aspirin and ibuprofen.  Having a family history of diverticulosis. What are the signs or symptoms? In most people, there are no symptoms of this condition. If you do have symptoms, they may include:  Bloating.  Cramps in the abdomen.  Constipation or diarrhea.  Pain in the lower left side of the abdomen. How is this diagnosed? Because diverticulosis usually has no symptoms, it is most often diagnosed during an exam for other colon problems. The condition may be diagnosed by:  Using a flexible scope to examine the colon (colonoscopy).  Taking an X-ray of the colon after dye has been put into the colon (barium enema).  Having a CT  scan. How is this treated? You may not need treatment for this condition. Your health care provider may recommend treatment to prevent problems. You may need treatment if you have symptoms or if you previously had diverticulitis. Treatment may include:  Eating a high-fiber diet.  Taking a fiber supplement.  Taking a live bacteria supplement (probiotic).  Taking medicine to relax your colon.   Follow these instructions at home: Medicines  Take over-the-counter and prescription medicines only as told by your health care provider.  If told by your health care provider, take a fiber supplement or probiotic. Constipation prevention Your condition may cause constipation. To prevent or treat constipation, you may need to:  Drink enough fluid to keep your urine pale yellow.  Take over-the-counter or prescription medicines.  Eat foods that are high in fiber, such as beans, whole grains, and fresh fruits and vegetables.  Limit foods that are high in fat and processed sugars, such as fried or sweet foods.   General instructions  Try not to strain when you have a bowel movement.  Keep all follow-up visits as told by your health care provider. This is important. Contact a health care provider if you:  Have pain in your abdomen.  Have bloating.  Have cramps.  Have not had a bowel movement in 3 days. Get help right away if:  Your pain gets worse.  Your bloating becomes very bad.  You have a fever or chills, and your symptoms suddenly get worse.  You vomit.  You have bowel movements that are bloody or black.  You have bleeding from your  rectum. Summary  Diverticulosis is a condition that develops when small pouches (diverticula) form in the wall of the large intestine (colon).  You may have a few pouches or many of them.  This condition is most often diagnosed during an exam for other colon problems.  Treatment may include increasing the fiber in your diet, taking  supplements, or taking medicines. This information is not intended to replace advice given to you by your health care provider. Make sure you discuss any questions you have with your health care provider. Document Revised: 07/24/2018 Document Reviewed: 07/24/2018 Elsevier Patient Education  2021 [employer].  Colonoscopy, Adult, Care After This sheet gives you information about how to care for yourself after your procedure. Your doctor may also give you more specific instructions. If you have problems or questions, call your doctor. What can I expect after the procedure? After the procedure, it is common to have:  A small amount of blood in your poop (stool) for 24 hours.  Some gas.  Mild cramping or bloating in your belly (abdomen). Follow these instructions at home: Eating and drinking  Drink enough fluid to keep your pee (urine) pale yellow.  Follow instructions from your doctor about what you cannot eat or drink.  Return to your normal diet as told by your doctor. Avoid heavy or fried foods that are hard to digest.   Activity  Rest as told by your doctor.  Do not sit for a long time without moving. Get up to take short walks every 1-2 hours. This is important. Ask for help if you feel weak or unsteady.  Return to your normal activities as told by your doctor. Ask your doctor what activities are safe for you. To help cramping and bloating:  Try walking around.  Put heat on your belly as told by your doctor. Use the heat source that your doctor recommends, such as a moist heat pack or a heating pad. ? Put a towel between your skin and the heat source. ? Leave the heat on for 20-30 minutes. ? Remove the heat if your skin turns bright red. This is very important if you are unable to feel pain, heat, or cold. You may have a greater risk of getting burned.   General instructions  If you were given a medicine to help you relax (sedative) during your procedure, it can affect you  for many hours. Do not drive or use machinery until your doctor says that it is safe.  For the first 24 hours after the procedure: ? Do not sign important documents. ? Do not drink alcohol. ? Do your daily activities more slowly than normal. ? Eat foods that are soft and easy to digest.  Take over-the-counter or prescription medicines only as told by your doctor.  Keep all follow-up visits as told by your doctor. This is important. Contact a doctor if:  You have blood in your poop 2-3 days after the procedure. Get help right away if:  You have more than a small amount of blood in your poop.  You see large clumps of tissue (blood clots) in your poop.  Your belly is swollen.  You feel like you may vomit (nauseous).  You vomit.  You have a fever.  You have belly pain that gets worse, and medicine does not help your pain. Summary  After the procedure, it is common to have a small amount of blood in your poop. You may also have mild cramping and bloating in  your belly.  If you were given a medicine to help you relax (sedative) during your procedure, it can affect you for many hours. Do not drive or use machinery until your doctor says that it is safe.  Get help right away if you have a lot of blood in your poop, feel like you may vomit, have a fever, or have more belly pain. This information is not intended to replace advice given to you by your health care provider. Make sure you discuss any questions you have with your health care provider. Document Revised: 10/31/2018 Document Reviewed: 07/21/2018 Elsevier Patient Education  2021 [employer].  Upper Endoscopy, Adult, Care After This sheet gives you information about how to care for yourself after your procedure. Your health care provider may also give you more specific instructions. If you have problems or questions, contact your health care provider. What can I expect after the procedure? After the procedure, it is common  to have:  A sore throat.  Mild stomach pain or discomfort.  Bloating.  Nausea. Follow these instructions at home:  Follow instructions from your health care provider about what to eat or drink after your procedure.  Return to your normal activities as told by your health care provider. Ask your health care provider what activities are safe for you.  Take over-the-counter and prescription medicines only as told by your health care provider.  If you were given a sedative during the procedure, it can affect you for several hours. Do not drive or operate machinery until your health care provider says that it is safe.  Keep all follow-up visits as told by your health care provider. This is important.   Contact a health care provider if you have:  A sore throat that lasts longer than one day.  Trouble swallowing. Get help right away if:  You vomit blood or your vomit looks like coffee grounds.  You have: ? A fever. ? Bloody, black, or tarry stools. ? A severe sore throat or you cannot swallow. ? Difficulty breathing. ? Severe pain in your chest or abdomen. Summary  After the procedure, it is common to have a sore throat, mild stomach discomfort, bloating, and nausea.  If you were given a sedative during the procedure, it can affect you for several hours. Do not drive or operate machinery until your health care provider says that it is safe.  Follow instructions from your health care provider about what to eat or drink after your procedure.  Return to your normal activities as told by your health care provider. This information is not intended to replace advice given to you by your health care provider. Make sure you discuss any questions you have with your health care provider. Document Revised: 12/23/2018 Document Reviewed: 05/27/2017 Elsevier Patient Education  2021 [employer].

## 2020-05-28 LAB — PREPARE PLATELET PHERESIS: Unit division: 0

## 2020-05-28 LAB — BPAM PLATELET PHERESIS
Blood Product Expiration Date: 202205212359
ISSUE DATE / TIME: 202205200635
Unit Type and Rh: 7300

## 2020-05-29 ENCOUNTER — Other Ambulatory Visit: Payer: Self-pay | Admitting: Family Medicine

## 2020-05-29 DIAGNOSIS — F410 Panic disorder [episodic paroxysmal anxiety] without agoraphobia: Secondary | ICD-10-CM

## 2020-05-30 ENCOUNTER — Other Ambulatory Visit: Payer: Self-pay | Admitting: Family Medicine

## 2020-05-30 DIAGNOSIS — F410 Panic disorder [episodic paroxysmal anxiety] without agoraphobia: Secondary | ICD-10-CM

## 2020-05-30 NOTE — Telephone Encounter (Signed)
Noted, thank you

## 2020-05-31 ENCOUNTER — Telehealth (INDEPENDENT_AMBULATORY_CARE_PROVIDER_SITE_OTHER): Payer: Self-pay

## 2020-05-31 NOTE — Telephone Encounter (Signed)
He should start the spironolactone and continue the torsemide same as before. He should not take more potassium, and he should have a repeat BMP in 2 weeks.  Thanks

## 2020-05-31 NOTE — Telephone Encounter (Signed)
Patient called today wanting to know what the directions for the spironolactone were. He states he could not remember what directions you gave him about how to take with Toresemide and Potassium. Please advise.

## 2020-05-31 NOTE — Telephone Encounter (Signed)
Patient aware of all and will have labs drawn in two weeks. Orders mailed to the patient.

## 2020-05-31 NOTE — Telephone Encounter (Signed)
Spironolactone will help with potassium levels.

## 2020-06-01 DIAGNOSIS — K469 Unspecified abdominal hernia without obstruction or gangrene: Secondary | ICD-10-CM | POA: Diagnosis not present

## 2020-06-02 ENCOUNTER — Encounter (HOSPITAL_COMMUNITY): Payer: Self-pay | Admitting: Gastroenterology

## 2020-06-02 LAB — TYPE AND SCREEN
ABO/RH(D): A POS
Antibody Screen: NEGATIVE
Unit division: 0

## 2020-06-02 LAB — BPAM RBC
Blood Product Expiration Date: 202206152359
Unit Type and Rh: 6200

## 2020-06-03 DIAGNOSIS — G4733 Obstructive sleep apnea (adult) (pediatric): Secondary | ICD-10-CM | POA: Diagnosis not present

## 2020-06-03 DIAGNOSIS — J449 Chronic obstructive pulmonary disease, unspecified: Secondary | ICD-10-CM | POA: Diagnosis not present

## 2020-06-13 ENCOUNTER — Other Ambulatory Visit (INDEPENDENT_AMBULATORY_CARE_PROVIDER_SITE_OTHER): Payer: Self-pay | Admitting: Family Medicine

## 2020-06-16 ENCOUNTER — Inpatient Hospital Stay (HOSPITAL_COMMUNITY): Payer: Medicare Other | Attending: Hematology

## 2020-06-18 ENCOUNTER — Other Ambulatory Visit: Payer: Self-pay | Admitting: Family Medicine

## 2020-06-20 ENCOUNTER — Inpatient Hospital Stay (HOSPITAL_COMMUNITY): Payer: Medicare Other

## 2020-06-20 ENCOUNTER — Other Ambulatory Visit (INDEPENDENT_AMBULATORY_CARE_PROVIDER_SITE_OTHER): Payer: Self-pay | Admitting: Family Medicine

## 2020-06-21 ENCOUNTER — Inpatient Hospital Stay (HOSPITAL_COMMUNITY): Payer: Medicare Other | Attending: Hematology

## 2020-06-21 ENCOUNTER — Other Ambulatory Visit: Payer: Self-pay

## 2020-06-21 DIAGNOSIS — I81 Portal vein thrombosis: Secondary | ICD-10-CM | POA: Insufficient documentation

## 2020-06-21 DIAGNOSIS — K7460 Unspecified cirrhosis of liver: Secondary | ICD-10-CM | POA: Insufficient documentation

## 2020-06-21 DIAGNOSIS — E611 Iron deficiency: Secondary | ICD-10-CM | POA: Diagnosis not present

## 2020-06-21 DIAGNOSIS — D61818 Other pancytopenia: Secondary | ICD-10-CM | POA: Insufficient documentation

## 2020-06-21 LAB — CBC WITH DIFFERENTIAL/PLATELET
Abs Immature Granulocytes: 0 10*3/uL (ref 0.00–0.07)
Basophils Absolute: 0 10*3/uL (ref 0.0–0.1)
Basophils Relative: 2 %
Eosinophils Absolute: 0 10*3/uL (ref 0.0–0.5)
Eosinophils Relative: 3 %
HCT: 36.4 % — ABNORMAL LOW (ref 39.0–52.0)
Hemoglobin: 11.8 g/dL — ABNORMAL LOW (ref 13.0–17.0)
Immature Granulocytes: 0 %
Lymphocytes Relative: 24 %
Lymphs Abs: 0.3 10*3/uL — ABNORMAL LOW (ref 0.7–4.0)
MCH: 28.3 pg (ref 26.0–34.0)
MCHC: 32.4 g/dL (ref 30.0–36.0)
MCV: 87.3 fL (ref 80.0–100.0)
Monocytes Absolute: 0.1 10*3/uL (ref 0.1–1.0)
Monocytes Relative: 7 %
Neutro Abs: 0.9 10*3/uL — ABNORMAL LOW (ref 1.7–7.7)
Neutrophils Relative %: 64 %
Platelets: 39 10*3/uL — ABNORMAL LOW (ref 150–400)
RBC: 4.17 MIL/uL — ABNORMAL LOW (ref 4.22–5.81)
RDW: 15.8 % — ABNORMAL HIGH (ref 11.5–15.5)
WBC: 1.4 10*3/uL — CL (ref 4.0–10.5)
nRBC: 0 % (ref 0.0–0.2)

## 2020-06-21 LAB — COMPREHENSIVE METABOLIC PANEL
ALT: 16 U/L (ref 0–44)
AST: 30 U/L (ref 15–41)
Albumin: 3.7 g/dL (ref 3.5–5.0)
Alkaline Phosphatase: 86 U/L (ref 38–126)
Anion gap: 6 (ref 5–15)
BUN: 14 mg/dL (ref 6–20)
CO2: 23 mmol/L (ref 22–32)
Calcium: 8.8 mg/dL — ABNORMAL LOW (ref 8.9–10.3)
Chloride: 106 mmol/L (ref 98–111)
Creatinine, Ser: 0.84 mg/dL (ref 0.61–1.24)
GFR, Estimated: >60 mL/min (ref >60)
Glucose, Bld: 174 mg/dL — ABNORMAL HIGH (ref 70–99)
Potassium: 4 mmol/L (ref 3.5–5.1)
Sodium: 135 mmol/L (ref 135–145)
Total Bilirubin: 0.9 mg/dL (ref 0.3–1.2)
Total Protein: 7 g/dL (ref 6.5–8.1)

## 2020-06-21 LAB — IRON AND TIBC
Iron: 47 ug/dL (ref 45–182)
Saturation Ratios: 13 % — ABNORMAL LOW (ref 17.9–39.5)
TIBC: 363 ug/dL (ref 250–450)
UIBC: 316 ug/dL

## 2020-06-21 LAB — FERRITIN: Ferritin: 37 ng/mL (ref 24–336)

## 2020-06-21 LAB — VITAMIN B12: Vitamin B-12: 316 pg/mL (ref 180–914)

## 2020-06-21 LAB — FOLATE: Folate: 48.8 ng/mL (ref >5.9)

## 2020-06-22 ENCOUNTER — Other Ambulatory Visit (INDEPENDENT_AMBULATORY_CARE_PROVIDER_SITE_OTHER): Payer: Self-pay

## 2020-06-22 ENCOUNTER — Encounter (INDEPENDENT_AMBULATORY_CARE_PROVIDER_SITE_OTHER): Payer: Self-pay

## 2020-06-22 DIAGNOSIS — I8510 Secondary esophageal varices without bleeding: Secondary | ICD-10-CM

## 2020-06-22 LAB — PROTEIN ELECTROPHORESIS, SERUM
A/G Ratio: 1.2 (ref 0.7–1.7)
Albumin ELP: 3.7 g/dL (ref 2.9–4.4)
Alpha-1-Globulin: 0.2 g/dL (ref 0.0–0.4)
Alpha-2-Globulin: 0.4 g/dL (ref 0.4–1.0)
Beta Globulin: 0.9 g/dL (ref 0.7–1.3)
Gamma Globulin: 1.4 g/dL (ref 0.4–1.8)
Globulin, Total: 3 g/dL (ref 2.2–3.9)
Total Protein ELP: 6.7 g/dL (ref 6.0–8.5)

## 2020-06-22 LAB — COPPER, SERUM: Copper: 84 ug/dL (ref 69–132)

## 2020-06-23 ENCOUNTER — Other Ambulatory Visit (HOSPITAL_COMMUNITY): Payer: Self-pay | Admitting: Physician Assistant

## 2020-06-23 ENCOUNTER — Inpatient Hospital Stay (HOSPITAL_COMMUNITY): Payer: Medicare Other | Admitting: Physician Assistant

## 2020-06-23 ENCOUNTER — Ambulatory Visit: Payer: Self-pay | Admitting: *Deleted

## 2020-06-23 DIAGNOSIS — D61818 Other pancytopenia: Secondary | ICD-10-CM

## 2020-06-23 NOTE — Progress Notes (Deleted)
APPOINTMENT CANCELLED

## 2020-06-23 NOTE — Telephone Encounter (Signed)
Patient asking for recent lab results. Patient's provider is not within [facility]. Patient disconnected before letting him know we do not have permission to discuss his results.

## 2020-06-23 NOTE — Telephone Encounter (Signed)
Pt calling to review lab results. Labs ordered by oncologist not in system. Explained to pt  I could not review or advise on results, advised to call oncologist in AM. Pt verbalizes understanding.

## 2020-06-24 LAB — METHYLMALONIC ACID, SERUM: Methylmalonic Acid, Quantitative: 366 nmol/L (ref 0–378)

## 2020-06-28 ENCOUNTER — Inpatient Hospital Stay (HOSPITAL_COMMUNITY): Payer: Medicare Other | Admitting: Physician Assistant

## 2020-06-28 ENCOUNTER — Ambulatory Visit (HOSPITAL_COMMUNITY): Payer: Medicare Other | Admitting: Physician Assistant

## 2020-06-28 ENCOUNTER — Other Ambulatory Visit (HOSPITAL_COMMUNITY): Payer: Medicare Other

## 2020-06-28 NOTE — Progress Notes (Deleted)
DISREGARD

## 2020-06-29 ENCOUNTER — Other Ambulatory Visit: Payer: Self-pay | Admitting: Family Medicine

## 2020-06-29 DIAGNOSIS — F410 Panic disorder [episodic paroxysmal anxiety] without agoraphobia: Secondary | ICD-10-CM

## 2020-07-12 ENCOUNTER — Ambulatory Visit: Payer: Self-pay | Admitting: *Deleted

## 2020-07-12 NOTE — Telephone Encounter (Signed)
Pt reports fever 101.1 prior to call. States took tylenol 45 minutes ago, temp during call 100.5. Reports "Bad tooth, to be pulled 1-2 weeks." States now painful. Reports h/o sepsis. Also reports urinary urgency, Advised ED. States will follow disposition.

## 2020-07-12 NOTE — Telephone Encounter (Signed)
Pt is calling to speak to nurse - pt is extremely fatigue, fever with 101.2, and extremely thirsty.    Reason for Disposition  [1] Fever > 100.0 F (37.8 C) AND [2] surgery in the last month  Answer Assessment - Initial Assessment Questions 1. TEMPERATURE: "What is the most recent temperature?"  "How was it measured?"      101.1 2. ONSET: "When did the fever start?"      30 minutes ago 3. CHILLS: "Do you have chills?" If yes: "How bad are they?"  (e.g., none, mild, moderate, severe)   - NONE: no chills   - MILD: feeling cold   - MODERATE: feeling very cold, some shivering (feels better under a thick blanket)   - SEVERE: feeling extremely cold with shaking chills (general body shaking, rigors; even under a thick blanket)     This am, not now. 4. OTHER SYMPTOMS: "Do you have any other symptoms besides the fever?"  (e.g., abdomen pain, cough, diarrhea, earache, headache, sore throat, urination pain)     CP earlier today, took ASA, went away 5. CAUSE: If there are no symptoms, ask: "What do you think is causing the fever?"      Bad tooth 6. CONTACTS: "Does anyone else in the family have an infection?"     No 7. TREATMENT: "What have you done so far to treat this fever?" (e.g., medications)     Tylenol 8. IMMUNOCOMPROMISE: "Do you have of the following: diabetes, HIV positive, splenectomy, cancer chemotherapy, chronic steroid treatment, transplant patient, etc."  Protocols used: [facility]

## 2020-07-13 ENCOUNTER — Ambulatory Visit (HOSPITAL_COMMUNITY): Payer: Medicare Other | Admitting: Physician Assistant

## 2020-07-13 ENCOUNTER — Inpatient Hospital Stay (HOSPITAL_COMMUNITY): Payer: Medicare Other | Attending: Hematology

## 2020-07-22 NOTE — Patient Instructions (Signed)
[name]  07/22/2020     @PREFPERIOPPHARMACY @   Your procedure is scheduled on  07/29/2020.   Report to [facility] at  [phone]  A.M.   Call this number if you have problems the morning of surgery:  [phone]   Remember:  Follow the diet instructions given to you by the office.  Use your nebulizer before you come.    Take these medicines the morning of surgery with A SIP OF WATER         baclofen, suboxone,buspar, dilaudid or celebrex (if needed), colchicine, indocin, diltiazem, cartia, cymbalta, nexium, gabapentin, metoprolol.     Do not wear jewelry, make-up or nail polish.  Do not wear lotions, powders, or perfumes, or deodorant.  Do not shave 48 hours prior to surgery.  Men may shave face and neck.  Do not bring valuables to the hospital.  [facility] is not responsible for any belongings or valuables.  Contacts, dentures or bridgework may not be worn into surgery.  Leave your suitcase in the car.  After surgery it may be brought to your room.  For patients admitted to the hospital, discharge time will be determined by your treatment team.  Patients discharged the day of surgery will not be allowed to drive home and must have someone with them for 24 hours.    Special instructions:     DO NOT smoke tobacco or vape for 24 hours before your procedure.  Please read over the following fact sheets that you were given. Anesthesia Post-op Instructions and Care and Recovery After Surgery      Upper Endoscopy, Adult, Care After This sheet gives you information about how to care for yourself after your procedure. Your health care provider may also give you more specific instructions. If you have problems or questions, contact your health careprovider. What can I expect after the procedure? After the procedure, it is common to have: A sore throat. Mild stomach pain or discomfort. Bloating. Nausea. Follow these instructions at home:  Follow instructions  from your health care provider about what to eat or drink after your procedure. Return to your normal activities as told by your health care provider. Ask your health care provider what activities are safe for you. Take over-the-counter and prescription medicines only as told by your health care provider. If you were given a sedative during the procedure, it can affect you for several hours. Do not drive or operate machinery until your health care provider says that it is safe. Keep all follow-up visits as told by your health care provider. This is important. Contact a health care provider if you have: A sore throat that lasts longer than one day. Trouble swallowing. Get help right away if: You vomit blood or your vomit looks like coffee grounds. You have: A fever. Bloody, black, or tarry stools. A severe sore throat or you cannot swallow. Difficulty breathing. Severe pain in your chest or abdomen. Summary After the procedure, it is common to have a sore throat, mild stomach discomfort, bloating, and nausea. If you were given a sedative during the procedure, it can affect you for several hours. Do not drive or operate machinery until your health care provider says that it is safe. Follow instructions from your health care provider about what to eat or drink after your procedure. Return to your normal activities as told by your health care provider. This information is not intended to replace advice given to you  by your health care provider. Make sure you discuss any questions you have with your healthcare provider. Document Revised: 12/23/2018 Document Reviewed: 05/27/2017 Elsevier Patient Education  2022 [place] After This sheet gives you information about how to care for yourself after your procedure. Your health care provider may also give you more specific instructions. If you have problems or questions, contact your health careprovider. What can I  expect after the procedure? After the procedure, it is common to have: Tiredness. Forgetfulness about what happened after the procedure. Impaired judgment for important decisions. Nausea or vomiting. Some difficulty with balance. Follow these instructions at home: For the time period you were told by your health care provider:     Rest as needed. Do not participate in activities where you could fall or become injured. Do not drive or use machinery. Do not drink alcohol. Do not take sleeping pills or medicines that cause drowsiness. Do not make important decisions or sign legal documents. Do not take care of children on your own. Eating and drinking Follow the diet that is recommended by your health care provider. Drink enough fluid to keep your urine pale yellow. If you vomit: Drink water, juice, or soup when you can drink without vomiting. Make sure you have little or no nausea before eating solid foods. General instructions Have a responsible adult stay with you for the time you are told. It is important to have someone help care for you until you are awake and alert. Take over-the-counter and prescription medicines only as told by your health care provider. If you have sleep apnea, surgery and certain medicines can increase your risk for breathing problems. Follow instructions from your health care provider about wearing your sleep device: Anytime you are sleeping, including during daytime naps. While taking prescription pain medicines, sleeping medicines, or medicines that make you drowsy. Avoid smoking. Keep all follow-up visits as told by your health care provider. This is important. Contact a health care provider if: You keep feeling nauseous or you keep vomiting. You feel light-headed. You are still sleepy or having trouble with balance after 24 hours. You develop a rash. You have a fever. You have redness or swelling around the IV site. Get help right away if: You  have trouble breathing. You have new-onset confusion at home. Summary For several hours after your procedure, you may feel tired. You may also be forgetful and have poor judgment. Have a responsible adult stay with you for the time you are told. It is important to have someone help care for you until you are awake and alert. Rest as told. Do not drive or operate machinery. Do not drink alcohol or take sleeping pills. Get help right away if you have trouble breathing, or if you suddenly become confused. This information is not intended to replace advice given to you by your health care provider. Make sure you discuss any questions you have with your healthcare provider. Document Revised: 09/10/2019 Document Reviewed: 11/27/2018 Elsevier Patient Education  2022 [employer].

## 2020-07-26 ENCOUNTER — Encounter (HOSPITAL_COMMUNITY)
Admission: RE | Admit: 2020-07-26 | Discharge: 2020-07-26 | Disposition: A | Discharge status: Home / Self Care | Payer: Medicare Other | Source: Ambulatory Visit | Attending: Gastroenterology | Admitting: Gastroenterology

## 2020-07-26 DIAGNOSIS — K7460 Unspecified cirrhosis of liver: Secondary | ICD-10-CM

## 2020-07-27 ENCOUNTER — Encounter (HOSPITAL_COMMUNITY)
Admission: RE | Admit: 2020-07-27 | Discharge: 2020-07-27 | Disposition: A | Discharge status: Home / Self Care | Payer: Medicare Other | Source: Ambulatory Visit | Attending: Gastroenterology | Admitting: Gastroenterology

## 2020-07-27 ENCOUNTER — Other Ambulatory Visit: Payer: Self-pay

## 2020-07-27 ENCOUNTER — Encounter (HOSPITAL_COMMUNITY): Payer: Self-pay

## 2020-07-27 DIAGNOSIS — Z01812 Encounter for preprocedural laboratory examination: Secondary | ICD-10-CM | POA: Insufficient documentation

## 2020-07-27 DIAGNOSIS — K7460 Unspecified cirrhosis of liver: Secondary | ICD-10-CM

## 2020-07-27 HISTORY — DX: Acute embolism and thrombosis of unspecified vein: I82.90

## 2020-07-27 LAB — COMPREHENSIVE METABOLIC PANEL
ALT: 19 U/L (ref 0–44)
AST: 38 U/L (ref 15–41)
Albumin: 3.5 g/dL (ref 3.5–5.0)
Alkaline Phosphatase: 89 U/L (ref 38–126)
Anion gap: 8 (ref 5–15)
BUN: 18 mg/dL (ref 6–20)
CO2: 25 mmol/L (ref 22–32)
Calcium: 8.7 mg/dL — ABNORMAL LOW (ref 8.9–10.3)
Chloride: 101 mmol/L (ref 98–111)
Creatinine, Ser: 1.08 mg/dL (ref 0.61–1.24)
GFR, Estimated: >60 mL/min (ref >60)
Glucose, Bld: 126 mg/dL — ABNORMAL HIGH (ref 70–99)
Potassium: 4.3 mmol/L (ref 3.5–5.1)
Sodium: 134 mmol/L — ABNORMAL LOW (ref 135–145)
Total Bilirubin: 1.3 mg/dL — ABNORMAL HIGH (ref 0.3–1.2)
Total Protein: 6.9 g/dL (ref 6.5–8.1)

## 2020-07-27 LAB — CBC WITH DIFFERENTIAL/PLATELET
Abs Immature Granulocytes: 0.01 10*3/uL (ref 0.00–0.07)
Basophils Absolute: 0.1 10*3/uL (ref 0.0–0.1)
Basophils Relative: 2 %
Eosinophils Absolute: 0.2 10*3/uL (ref 0.0–0.5)
Eosinophils Relative: 6 %
HCT: 34.6 % — ABNORMAL LOW (ref 39.0–52.0)
Hemoglobin: 11.2 g/dL — ABNORMAL LOW (ref 13.0–17.0)
Immature Granulocytes: 0 %
Lymphocytes Relative: 21 %
Lymphs Abs: 0.5 10*3/uL — ABNORMAL LOW (ref 0.7–4.0)
MCH: 28.1 pg (ref 26.0–34.0)
MCHC: 32.4 g/dL (ref 30.0–36.0)
MCV: 86.9 fL (ref 80.0–100.0)
Monocytes Absolute: 0.2 10*3/uL (ref 0.1–1.0)
Monocytes Relative: 7 %
Neutro Abs: 1.6 10*3/uL — ABNORMAL LOW (ref 1.7–7.7)
Neutrophils Relative %: 64 %
Platelets: 56 10*3/uL — ABNORMAL LOW (ref 150–400)
RBC: 3.98 MIL/uL — ABNORMAL LOW (ref 4.22–5.81)
RDW: 15.3 % (ref 11.5–15.5)
WBC: 2.5 10*3/uL — ABNORMAL LOW (ref 4.0–10.5)
nRBC: 0 % (ref 0.0–0.2)

## 2020-07-27 LAB — PROTIME-INR
INR: 1.2 (ref 0.8–1.2)
Prothrombin Time: 15 seconds (ref 11.4–15.2)

## 2020-07-28 ENCOUNTER — Other Ambulatory Visit: Payer: Self-pay | Admitting: Family Medicine

## 2020-07-29 ENCOUNTER — Ambulatory Visit (HOSPITAL_COMMUNITY): Payer: Medicare Other | Admitting: Anesthesiology

## 2020-07-29 ENCOUNTER — Encounter (HOSPITAL_COMMUNITY): Payer: Self-pay | Admitting: Gastroenterology

## 2020-07-29 ENCOUNTER — Other Ambulatory Visit: Payer: Self-pay

## 2020-07-29 ENCOUNTER — Encounter (HOSPITAL_COMMUNITY)
Admission: RE | Disposition: A | Discharge status: Home / Self Care | Payer: Self-pay | Source: Home / Self Care | Attending: Gastroenterology

## 2020-07-29 ENCOUNTER — Ambulatory Visit (HOSPITAL_COMMUNITY)
Admission: RE | Admit: 2020-07-29 | Discharge: 2020-07-29 | Disposition: A | Discharge status: Home / Self Care | Payer: Medicare Other | Attending: Gastroenterology | Admitting: Gastroenterology

## 2020-07-29 DIAGNOSIS — K219 Gastro-esophageal reflux disease without esophagitis: Secondary | ICD-10-CM | POA: Insufficient documentation

## 2020-07-29 DIAGNOSIS — Z7982 Long term (current) use of aspirin: Secondary | ICD-10-CM | POA: Diagnosis not present

## 2020-07-29 DIAGNOSIS — Z09 Encounter for follow-up examination after completed treatment for conditions other than malignant neoplasm: Secondary | ICD-10-CM | POA: Diagnosis present

## 2020-07-29 DIAGNOSIS — M109 Gout, unspecified: Secondary | ICD-10-CM | POA: Insufficient documentation

## 2020-07-29 DIAGNOSIS — Z8719 Personal history of other diseases of the digestive system: Secondary | ICD-10-CM | POA: Diagnosis not present

## 2020-07-29 DIAGNOSIS — G4733 Obstructive sleep apnea (adult) (pediatric): Secondary | ICD-10-CM | POA: Insufficient documentation

## 2020-07-29 DIAGNOSIS — Z79899 Other long term (current) drug therapy: Secondary | ICD-10-CM | POA: Insufficient documentation

## 2020-07-29 DIAGNOSIS — I509 Heart failure, unspecified: Secondary | ICD-10-CM | POA: Insufficient documentation

## 2020-07-29 DIAGNOSIS — K7030 Alcoholic cirrhosis of liver without ascites: Secondary | ICD-10-CM | POA: Insufficient documentation

## 2020-07-29 DIAGNOSIS — K3189 Other diseases of stomach and duodenum: Secondary | ICD-10-CM

## 2020-07-29 DIAGNOSIS — I4891 Unspecified atrial fibrillation: Secondary | ICD-10-CM | POA: Diagnosis not present

## 2020-07-29 DIAGNOSIS — F32A Depression, unspecified: Secondary | ICD-10-CM | POA: Insufficient documentation

## 2020-07-29 DIAGNOSIS — J449 Chronic obstructive pulmonary disease, unspecified: Secondary | ICD-10-CM | POA: Diagnosis not present

## 2020-07-29 DIAGNOSIS — I110 Hypertensive heart disease with heart failure: Secondary | ICD-10-CM | POA: Diagnosis not present

## 2020-07-29 DIAGNOSIS — Z86718 Personal history of other venous thrombosis and embolism: Secondary | ICD-10-CM | POA: Insufficient documentation

## 2020-07-29 DIAGNOSIS — I8510 Secondary esophageal varices without bleeding: Secondary | ICD-10-CM

## 2020-07-29 DIAGNOSIS — I8500 Esophageal varices without bleeding: Secondary | ICD-10-CM | POA: Diagnosis not present

## 2020-07-29 HISTORY — PX: ESOPHAGOGASTRODUODENOSCOPY (EGD) WITH PROPOFOL: SHX5813

## 2020-07-29 LAB — GLUCOSE, CAPILLARY: Glucose-Capillary: 132 mg/dL — ABNORMAL HIGH (ref 70–99)

## 2020-07-29 SURGERY — ESOPHAGOGASTRODUODENOSCOPY (EGD) WITH PROPOFOL
Anesthesia: General

## 2020-07-29 MED ORDER — SODIUM CHLORIDE 0.9 % IV SOLN: INTRAVENOUS | Status: DC

## 2020-07-29 MED ORDER — PROPOFOL 10 MG/ML IV BOLUS
INTRAVENOUS | Status: DC | PRN
  Administered 2020-07-29 (×2): 50 mg via INTRAVENOUS
  Administered 2020-07-29: 100 mg via INTRAVENOUS

## 2020-07-29 MED ORDER — LIDOCAINE HCL (CARDIAC) PF 100 MG/5ML IV SOSY
PREFILLED_SYRINGE | INTRAVENOUS | Status: DC | PRN
  Administered 2020-07-29: 100 mg via INTRAVENOUS

## 2020-07-29 MED ORDER — LACTATED RINGERS IV SOLN: INTRAVENOUS | Status: DC

## 2020-07-29 NOTE — Progress Notes (Signed)
"  Hey are you a nurse, look at my bottom and see if my bed sore is there".  Advised patient there is approximately 1 centimeter abrasion of skin on right midline buttock.  Patient states "I have been putting bandages on it". Advised patient to follow up with primary care MD regarding skin care. Patient and friend verbalized understanding.

## 2020-07-29 NOTE — Anesthesia Preprocedure Evaluation (Signed)
Anesthesia Evaluation  Patient identified by MRN, date of birth, ID band Patient awake    Reviewed: Allergy & Precautions, NPO status , Patient's Chart, lab work & pertinent test results  Airway Mallampati: II  TM Distance: >3 FB Neck ROM: Full    Dental  (+) Dental Advisory Given, Missing   Pulmonary asthma , sleep apnea and Continuous Positive Airway Pressure Ventilation , COPD,    Pulmonary exam normal breath sounds clear to auscultation       Cardiovascular hypertension, + CAD, +CHF and + DVT  Normal cardiovascular exam+ dysrhythmias Atrial Fibrillation  Rhythm:Regular Rate:Normal  1. Left ventricular ejection fraction, by estimation, is 55 to 60%. The left ventricle has normal function. The left ventricle has no regional wall motion abnormalities. There is mild left ventricular hypertrophy. Left ventricular diastolic parameters are indeterminate.  2. Right ventricular systolic function is normal. The right ventricular size is normal. Tricuspid regurgitation signal is inadequate for assessing PA pressure.  3. Left atrial size was mildly dilated.  4. The mitral valve is grossly normal. Trivial mitral valve  regurgitation.  5. The aortic valve is tricuspid. Aortic valve regurgitation is not visualized.  6. The inferior vena cava is normal in size with greater than 50% \respiratory variability, suggesting right atrial pressure of 3 mmHg.    Neuro/Psych  Headaches, PSYCHIATRIC DISORDERS Anxiety Depression  Neuromuscular disease    GI/Hepatic GERD  Medicated,(+) Cirrhosis   Esophageal Varices  substance abuse  alcohol use,   Endo/Other    Renal/GU      Musculoskeletal  (+) Arthritis  (gout), Rheumatoid disorders,    Abdominal   Peds  Hematology  (+) Blood dyscrasia (thrombocytopenia, platelets 56), anemia ,   Anesthesia Other Findings Back pain, neck pain  Reproductive/Obstetrics                             Anesthesia Physical Anesthesia Plan  ASA: 3  Anesthesia Plan: General   Post-op Pain Management:    Induction: Intravenous  PONV Risk Score and Plan: Propofol infusion  Airway Management Planned: Nasal Cannula and Natural Airway  Additional Equipment:   Intra-op Plan:   Post-operative Plan:   Informed Consent: I have reviewed the patients History and Physical, chart, labs and discussed the procedure including the risks, benefits and alternatives for the proposed anesthesia with the patient or authorized representative who has indicated his/her understanding and acceptance.     Dental advisory given  Plan Discussed with: CRNA and Surgeon  Anesthesia Plan Comments:         Anesthesia Quick Evaluation

## 2020-07-29 NOTE — Transfer of Care (Signed)
Immediate Anesthesia Transfer of Care Note  Patient: Deivi D Kreiser  Procedure(s) Performed: ESOPHAGOGASTRODUODENOSCOPY (EGD) WITH PROPOFOL  Patient Location: Short Stay  Anesthesia Type:General  Level of Consciousness: awake and alert   Airway & Oxygen Therapy: Patient Spontanous Breathing  Post-op Assessment: Report given to RN and Post -op Vital signs reviewed and stable  Post vital signs: Reviewed and stable  Last Vitals:  Vitals Value Taken Time  BP    Temp    Pulse 70 07/29/20  0952  Resp    SpO2 97 07/29/20 0952    Last Pain:  Vitals:   07/29/20 0700  TempSrc: Oral  PainSc: 0-No pain         Complications: No notable events documented.

## 2020-07-29 NOTE — Discharge Instructions (Addendum)
You are being discharged to home.  Continue your present medications.  Your physician has recommended a repeat upper endoscopy in two years for surveillance.

## 2020-07-29 NOTE — Op Note (Addendum)
[facility] Patient Name: [name] Procedure Date: 07/29/2020 9:23 AM MRN: [id number] Date of Birth: Oct 31, 1965 Attending MD: [name] ,  CSN: [id number] Age: 55 Admit Type: Outpatient Procedure:                Upper GI endoscopy Indications:              Follow-up of esophageal varices Providers:                [name], Janeece Riggers, RN, Kristine L.                            Risa Grill, Technician Referring MD:              Medicines:                Monitored Anesthesia Care Complications:            No immediate complications. Estimated Blood Loss:     Estimated blood loss: none. Procedure:                Pre-Anesthesia Assessment:                           - Prior to the procedure, a History and Physical                            was performed, and patient medications, allergies                            and sensitivities were reviewed. The patient's                            tolerance of previous anesthesia was reviewed.                           - The risks and benefits of the procedure and the                            sedation options and risks were discussed with the                            patient. All questions were answered and informed                            consent was obtained.                           - ASA Grade Assessment: III - A patient with severe                            systemic disease.                           After obtaining informed consent, the endoscope was                            passed under direct vision. Throughout the  procedure, the patient's blood pressure, pulse, and                            oxygen saturations were monitored continuously. The                            [id number]-H190 ([id number]) scope was introduced through the                            mouth, and advanced to the antrum of the stomach.                            The upper GI endoscopy was performed with                             difficulty due to presence of food. The patient                            tolerated the procedure well. Scope In: 9:44:34 AM Scope Out: 9:46:39 AM Total Procedure Duration: 0 hours 2 minutes 5 seconds  Findings:      The examined esophagus was normal. There was presence of scant food       debris but upon careful inspection no varices were observed.      A large amount of food (residue) was found in the entire examined       stomach. adequat eexamination of the stomach lining could not be       performed due to this. The scope was withdrawn to avoid risk of       aspiration. Impression:               - Normal esophagus.                           - A large amount of food (residue) in the stomach.                           - No specimens collected. Moderate Sedation:      Per Anesthesia Care Recommendation:           - Discharge patient to home (ambulatory).                           - Continue present medications.                           - Repeat upper endoscopy in 2 years for                            surveillance. Procedure Code(s):        --- Professional ---                           [id number], 52, Esophagogastroduodenoscopy, flexible,                            transoral; diagnostic, including collection of  specimen(s) by brushing or washing, when performed                            (separate procedure) Diagnosis Code(s):        --- Professional ---                           I85.00, Esophageal varices without bleeding CPT copyright 2019 American [employer]. All rights reserved. The codes documented in this report are preliminary and upon coder review may  be revised to meet current compliance requirements. [name], MD [name],  07/29/2020 9:52:14 AM This report has been signed electronically. Number of Addenda: 0

## 2020-07-29 NOTE — H&P (Signed)
[name] is an 55 y.o. male.   Chief Complaint: Follow-up esophageal varices HPI: [name] is a 55 y.o. male medical history of alcoholic cirrhosis complicated by hepatic encephalopathy, nonbleeding grade 1 esophageal varices, pancytopenia due to hypersplenism, atrial fibrillation, congestive heart failure, rheumatoid arthritis, OSA on CPAP, psoriasis, gout, depression, GERD, COPD, who presents for follow-up of esophageal varices.  Patient had his most recent EGD performed on 05/27/2020.  At that time he had 1 column of varices banded x1.  He comes today for surveillance after his most recent EGD.  Denies any abdominal pain, nausea, vomiting, fever, chills, melena, hematochezia, hematemesis.  Most recent platelet count is more than 50,000.  Past Medical History:  Diagnosis Date   Anxiety    Atrial fibrillation (HCC)    Blood clot in vein    blood clot in portal vein   CHF (congestive heart failure) (HCC)    Cirrhosis (HCC)    NASH   Constipation    COPD (chronic obstructive pulmonary disease) (HCC)    GERD (gastroesophageal reflux disease)    Gout    Leukopenia 07/08/2015   Neuromuscular disorder (HCC)    neuropathy in hands and feet   Psoriasis    RA (rheumatoid arthritis) ([place])    Sleep apnea    cpap used- level 10 and greater   Thrombocytopenia ([place])     Past Surgical History:  Procedure Laterality Date   ANKLE SURGERY     right ankle talor repair   CHOLECYSTECTOMY  sept 2016   CHOLECYSTECTOMY     COLONOSCOPY  05/08/2011   Procedure: COLONOSCOPY;  Surgeon: Rogene Houston, MD;  [facility];  Service: Endoscopy;  Laterality: N/A;  730   COLONOSCOPY WITH PROPOFOL N/A 05/27/2020   Procedure: COLONOSCOPY WITH PROPOFOL;  Surgeon: Harvel Quale, MD;  Location: [facility];  Service: Gastroenterology;  Laterality: N/A;  Patient needs a unit of platelets prior to procedure.   ESOPHAGEAL BANDING N/A 05/27/2020   Procedure: ESOPHAGEAL BANDING;   Surgeon: Harvel Quale, MD;  [facility];  Service: Gastroenterology;  Laterality: N/A;   ESOPHAGOGASTRODUODENOSCOPY  02/28/2011   Procedure: ESOPHAGOGASTRODUODENOSCOPY (EGD);  Surgeon: Rogene Houston, MD;  [facility];  Service: Endoscopy;  Laterality: N/A;  1200   ESOPHAGOGASTRODUODENOSCOPY N/A 06/11/2012   Procedure: ESOPHAGOGASTRODUODENOSCOPY (EGD);  Surgeon: Rogene Houston, MD;  [facility];  Service: Endoscopy;  Laterality: N/A;  1200  FYI patient is 400 pounds   ESOPHAGOGASTRODUODENOSCOPY (EGD) WITH PROPOFOL N/A 04/21/2014   Procedure: ESOPHAGOGASTRODUODENOSCOPY (EGD) WITH PROPOFOL;  Surgeon: Rogene Houston, MD;  Location: [facility];  Service: Endoscopy;  Laterality: N/A;   ESOPHAGOGASTRODUODENOSCOPY (EGD) WITH PROPOFOL N/A 05/27/2020   Procedure: ESOPHAGOGASTRODUODENOSCOPY (EGD) WITH PROPOFOL;  Surgeon: Harvel Quale, MD;  [facility]: [facility];  Service: Gastroenterology;  Laterality: N/A;  7:30 am   HERNIA REPAIR Right    as child; inguinal   HERNIA REPAIR  sept 2016   LIVER BIOPSY  2012   SCIATIC NERVE EXPLORATION     TONSILLECTOMY      Family History  Problem Relation Age of Onset   Colon cancer Mother    Cancer Mother        intestine   Cancer Father        throat   Cancer Brother        brain   Cancer Sister    Heart attack Neg Hx    Stroke Neg  Hx    Social History:  reports that he has never smoked. His smokeless tobacco use includes snuff and chew. He reports current alcohol use of about 4.0 standard drinks of alcohol per week. He reports that he does not use drugs.  Allergies:  Allergies  Allergen Reactions   Allopurinol Other (See Comments)    Joint pain worse     Medications Prior to Admission  Medication Sig Dispense Refill   aspirin EC 325 MG tablet Take 162.5 mg by mouth every 6 (six) hours as needed for moderate pain.     baclofen (LIORESAL) 10 MG tablet TAKE 1 TABLET BY MOUTH THREE TIMES A DAY  (Patient taking differently: Take 10 mg by mouth 3 (three) times daily.) 30 tablet 3   betamethasone dipropionate 0.05 % cream APPLY TO AFFECTED AREA TWICE A DAY (Patient taking differently: Apply 1 application topically daily as needed (itching).) 45 g 3   buprenorphine-naloxone (SUBOXONE) 8-2 mg SUBL SL tablet Place 0.5 tablets under the tongue 2 (two) times daily as needed (pain).     busPIRone (BUSPAR) 10 MG tablet TAKE 1 TABLET BY MOUTH THREE TIMES A DAY (Patient taking differently: Take 10 mg by mouth 3 (three) times daily.) 90 tablet 3   colchicine 0.6 MG tablet Take 0.6 mg by mouth daily as needed (gout).     [employer] FOLIC ACID 712 MCG tablet TAKE 1 TABLET BY MOUTH EVERY DAY (Patient taking differently: Take 800 mcg by mouth daily.) 90 tablet 4   diclofenac Sodium (VOLTAREN) 1 % GEL Apply 4 g topically 4 (four) times daily as needed (pain).     diltiazem (CARDIZEM SR) 90 MG 12 hr capsule Take 1 capsule (90 mg total) by mouth daily as needed. palpitations (Patient taking differently: Take 90 mg by mouth daily as needed (palpitations).) 30 capsule 3   diltiazem (CARTIA XT) 300 MG 24 hr capsule TAKE ONE CAPSULE BY MOUTH EVERY DAY (Patient taking differently: Take 300 mg by mouth daily.) 90 capsule 3   DULoxetine (CYMBALTA) 60 MG capsule Take 60 mg by mouth daily.     esomeprazole (NEXIUM) 40 MG capsule TAKE 1 CAPSULE BY MOUTH EVERY DAY (Patient taking differently: Take 40 mg by mouth daily.) 90 capsule 1   ezetimibe (ZETIA) 10 MG tablet Take 1 tablet (10 mg total) by mouth daily. 30 tablet 5   fluticasone (FLONASE) 50 MCG/ACT nasal spray USE 2 SPRAYS IN EACH NOSTRIL EVERY DAY AS NEEDED (Patient taking differently: Place 2 sprays into both nostrils daily as needed for allergies.) 48 mL 1   gabapentin (NEURONTIN) 800 MG tablet Take 1 tablet (800 mg total) by mouth 3 (three) times daily. 270 tablet 3   HYDROmorphone (DILAUDID) 2 MG tablet Take 2 mg by mouth 3 (three) times daily as needed for moderate  pain.     indomethacin (INDOCIN) 50 MG capsule Take 50 mg by mouth 3 (three) times daily as needed for moderate pain.     ipratropium (ATROVENT) 0.02 % nebulizer solution Take 0.5 mg by nebulization every 6 (six) hours as needed for wheezing or shortness of breath.      KLOR-CON M20 20 MEQ tablet Take 40 mEq by mouth daily as needed (potassium deficiency).     lactulose (CHRONULAC) 10 GM/15ML solution Take 30 mLs (20 g total) by mouth 2 (two) times daily. titate to 2-3 bowel movmeents per day (Patient taking differently: Take 20 g by mouth 2 (two) times daily.) 1892 mL 5   lidocaine (XYLOCAINE)  5 % ointment Apply 1 application topically as needed. 35.44 g 0   magnesium oxide (MAG-OX) 400 MG tablet TAKE 400 MG BY MOUTH DAILY. (Patient taking differently: Take 400 mg by mouth daily.) 30 tablet 1   Menthol-Methyl Salicylate (MUSCLE RUB) 10-15 % CREA Apply 1 application topically 3 (three) times daily as needed for muscle pain. For muscle pain     metolazone (ZAROXOLYN) 2.5 MG tablet Take 1 tablet (2.5 mg total) by mouth 3 (three) times a week. (Patient taking differently: Take 2.5 mg by mouth 3 (three) times a week.) 30 tablet 1   metoprolol tartrate (LOPRESSOR) 25 MG tablet Take 1 tablet (25 mg total) by mouth 2 (two) times daily. 180 tablet 3   Milk Thistle 1000 MG CAPS Take 1,000 mg by mouth in the morning, at noon, and at bedtime.     Multiple Vitamins-Minerals (MULTI COMPLETE PO) Take 1 tablet by mouth daily.      pravastatin (PRAVACHOL) 10 MG tablet Take 1 tablet (10 mg total) by mouth daily. 90 tablet 3   QUEtiapine (SEROQUEL) 100 MG tablet TAKE 1 TABLET BY MOUTH EVERYDAY AT BEDTIME (Patient taking differently: Take 50 mg by mouth at bedtime.) 90 tablet 1   spironolactone (ALDACTONE) 50 MG tablet Take 1 tablet (50 mg total) by mouth daily. 90 tablet 3   torsemide (DEMADEX) 20 MG tablet Take 4 tablets (80 mg total) by mouth 2 (two) times daily. (Patient taking differently: Take 20 mg by mouth in  the morning, at noon, in the evening, and at bedtime.) 720 tablet 3   OneTouch Delica Lancets 23F MISC Test BS QID Dx E11.9 400 each 3    Results for orders placed or performed during the hospital encounter of 07/29/20 (from the past 48 hour(s))  Glucose, capillary     Status: Abnormal   Collection Time: 07/29/20  6:53 AM  Result Value Ref Range   Glucose-Capillary 132 (H) 70 - 99 mg/dL    Comment: Glucose reference range applies only to samples taken after fasting for at least 8 hours.   *Note: Due to a large number of results and/or encounters for the requested time period, some results have not been displayed. A complete set of results can be found in Results Review.   No results found.  Review of Systems  Constitutional: Negative.   HENT: Negative.    Eyes: Negative.   Respiratory: Negative.    Cardiovascular: Negative.   Gastrointestinal: Negative.   Endocrine: Negative.   Genitourinary: Negative.   Musculoskeletal: Negative.   Skin: Negative.   Allergic/Immunologic: Negative.   Neurological: Negative.   Hematological: Negative.   Psychiatric/Behavioral: Negative.     Blood pressure 128/74, pulse 73, temperature 98.4 F (36.9 C), temperature source Oral, resp. rate 20, SpO2 97 %. Physical Exam  GENERAL: The patient is AO x3, in no acute distress. Obese. HEENT: Head is normocephalic and atraumatic. EOMI are intact. Mouth is well hydrated and without lesions. NECK: Supple. No masses LUNGS: Clear to auscultation. No presence of rhonchi/wheezing/rales. Adequate chest expansion HEART: RRR, normal s1 and s2. ABDOMEN: Soft, nontender, no guarding, no peritoneal signs, and nondistended. BS +. No masses. EXTREMITIES: Without any cyanosis, clubbing, rash, lesions or edema. NEUROLOGIC: AOx3, no focal motor deficit. SKIN: no jaundice, no rashes  Assessment/Plan NAFTALI CARCHI is a 55 y.o. male medical history of alcoholic cirrhosis complicated by hepatic encephalopathy,  nonbleeding grade 1 esophageal varices, pancytopenia due to hypersplenism, atrial fibrillation, congestive heart failure, rheumatoid arthritis, OSA  on CPAP, psoriasis, gout, depression, GERD, COPD, who presents for follow-up of esophageal varices. Will proceed with EGD.  Harvel Quale, MD 07/29/2020, 7:29 AM

## 2020-07-29 NOTE — Anesthesia Postprocedure Evaluation (Signed)
Anesthesia Post Note  Patient: [name]  Procedure(s) Performed: ESOPHAGOGASTRODUODENOSCOPY (EGD) WITH PROPOFOL  Patient location during evaluation: Phase II Anesthesia Type: General Level of consciousness: awake and alert and oriented Pain management: pain level controlled Vital Signs Assessment: post-procedure vital signs reviewed and stable Respiratory status: spontaneous breathing and respiratory function stable Cardiovascular status: blood pressure returned to baseline and stable Postop Assessment: no apparent nausea or vomiting Anesthetic complications: no Comments: Large amount of food in the stomach.    No notable events documented.   Last Vitals:  Vitals:   07/29/20 0700 07/29/20 0955  BP: 128/74 109/69  Pulse: 73 69  Resp: 20 12  Temp: 36.9 C 36.9 C  SpO2: 97% 97%    Last Pain:  Vitals:   07/29/20 0955  TempSrc: Oral  PainSc: 0-No pain                 Jamesina Gaugh C Neita Landrigan

## 2020-08-03 ENCOUNTER — Encounter (HOSPITAL_COMMUNITY): Payer: Self-pay | Admitting: Gastroenterology

## 2020-08-11 ENCOUNTER — Other Ambulatory Visit: Payer: Self-pay | Admitting: Family Medicine

## 2020-08-11 DIAGNOSIS — M1A9XX0 Chronic gout, unspecified, without tophus (tophi): Secondary | ICD-10-CM

## 2020-09-11 ENCOUNTER — Other Ambulatory Visit: Payer: Self-pay | Admitting: Family Medicine

## 2020-09-11 DIAGNOSIS — F339 Major depressive disorder, recurrent, unspecified: Secondary | ICD-10-CM

## 2020-09-29 ENCOUNTER — Ambulatory Visit (INDEPENDENT_AMBULATORY_CARE_PROVIDER_SITE_OTHER): Payer: Medicare Other | Admitting: Gastroenterology

## 2020-09-30 ENCOUNTER — Telehealth: Payer: Self-pay

## 2020-09-30 NOTE — Telephone Encounter (Signed)
Patient calls today to ask if he can have a virtual visit for his appointment next Friday. He recently had a fall and is re-learning how to walk. I explained that we could not do a virtual first visit that we would have to lay eyes on him. He verbalized understanding. He does not know how quickly he will improve and will call us on Wednesday if he needs to reschedule.

## 2020-10-07 ENCOUNTER — Encounter: Payer: Medicare Other | Admitting: Vascular Surgery

## 2020-10-13 ENCOUNTER — Ambulatory Visit (INDEPENDENT_AMBULATORY_CARE_PROVIDER_SITE_OTHER): Payer: Medicare Other | Admitting: Gastroenterology

## 2020-10-14 ENCOUNTER — Encounter: Payer: Medicare Other | Admitting: Vascular Surgery

## 2020-11-10 ENCOUNTER — Ambulatory Visit (INDEPENDENT_AMBULATORY_CARE_PROVIDER_SITE_OTHER): Payer: Medicare Other | Admitting: Gastroenterology

## 2020-11-16 ENCOUNTER — Telehealth (INDEPENDENT_AMBULATORY_CARE_PROVIDER_SITE_OTHER): Payer: Self-pay

## 2020-11-16 NOTE — Telephone Encounter (Signed)
Patient called today stating he has been in both [facility] and [facility] for a spinal bone infection due to [facility]. He states he has not walked in 40 days. Hospital discharged him home with Levofloxacin 750 mg Q day. He feels it is helping, but wanted to know if you thought this was the best antibiotic for the condition. He states he had seen the pcp this am whom would not address this. He has reached out to Hematologist whom told him this was a GI issue. I advised to call the Orthopedic Doctor whom found the infection to ask his advise. He states he would do that now. I advised we may not be able to access the records to see what was going on. He was last seen 04/28/2020 by Dr Jenetta Downer. He would like your opinon any way please advise.

## 2020-11-16 NOTE — Telephone Encounter (Signed)
I called left a voice message for the patient to return call.

## 2020-11-16 NOTE — Telephone Encounter (Signed)
I see in the notes from [place] that her was seen by infectious disease who recommended the current antibiotic regimen for his osteomyelitis. He needs to continue the regimen as directed when dischrarged frokm the hospital but this should be better addressed by his PCP or an infectious disease specialist (the orthopedist seeing him can discuss referring him if the infection persists). It is not in my area of scope to determine whether this antibiotic is adequate for a bone infection.

## 2020-11-17 NOTE — Telephone Encounter (Signed)
Patient made aware.

## 2020-12-13 ENCOUNTER — Institutional Professional Consult (permissible substitution): Payer: Medicare Other | Admitting: Pulmonary Disease

## 2020-12-14 ENCOUNTER — Telehealth (INDEPENDENT_AMBULATORY_CARE_PROVIDER_SITE_OTHER): Payer: Medicare Other | Admitting: Gastroenterology

## 2020-12-14 ENCOUNTER — Telehealth (INDEPENDENT_AMBULATORY_CARE_PROVIDER_SITE_OTHER): Payer: Self-pay | Admitting: Gastroenterology

## 2020-12-14 NOTE — Telephone Encounter (Signed)
Patient called stated he has been transferred to [facility] in [place]

## 2021-01-31 ENCOUNTER — Institutional Professional Consult (permissible substitution): Payer: Medicare Other | Admitting: Pulmonary Disease

## 2021-02-03 ENCOUNTER — Other Ambulatory Visit: Payer: Self-pay | Admitting: Cardiology

## 2021-02-13 ENCOUNTER — Institutional Professional Consult (permissible substitution): Payer: Medicare Other | Admitting: Pulmonary Disease

## 2021-02-14 ENCOUNTER — Telehealth: Payer: Self-pay | Admitting: Cardiology

## 2021-02-14 NOTE — Telephone Encounter (Signed)
Spoke with patient who asked if he should take Pravachol because he just found his prescription bottle. I informed him that Dr. Percival Spanish wants him to take all of the medications as prescribed. Pt voiced understanding.

## 2021-02-14 NOTE — Telephone Encounter (Signed)
Pt c/o medication issue:  1. Name of Medication:  pravastatin (PRAVACHOL) 10 MG tablet  2. How are you currently taking this medication (dosage and times per day)? Has not been taking in maybe a month  3. Are you having a reaction (difficulty breathing--STAT)? no  4. What is your medication issue? Patient states he is not sure if he needs to be taking the medication. He says he found the bottle and had not been taking it for about a month. He also states his PCP will not be refilling his diltiazem and wants to know if Dr. Percival Spanish will take over the refills. He says he does not need a refill now, but it is for the 90 mg and 300 mg capsules.

## 2021-02-20 ENCOUNTER — Telehealth: Payer: Self-pay | Admitting: Cardiology

## 2021-02-20 NOTE — Telephone Encounter (Signed)
Called pt to clarify his message. Pt made aware the potassium was prescribed by another provider so it would be best for them to refill that medication since it's not being used for a cardiac issue. He verbalized understanding.

## 2021-02-20 NOTE — Telephone Encounter (Signed)
Pt c/o medication issue:  1. Name of Medication: Potassium  2. How are you currently taking this medication (dosage and times per day)?    3. Are you having a reaction (difficulty breathing--STAT)? no  4. What is your medication issue? Patient calling in to see if Dr. Percival Spanish will order him some potassium medication.

## 2021-03-01 ENCOUNTER — Other Ambulatory Visit: Payer: Self-pay | Admitting: Cardiology

## 2021-03-01 DIAGNOSIS — I480 Paroxysmal atrial fibrillation: Secondary | ICD-10-CM

## 2021-03-01 DIAGNOSIS — I5032 Chronic diastolic (congestive) heart failure: Secondary | ICD-10-CM

## 2021-03-01 DIAGNOSIS — E1159 Type 2 diabetes mellitus with other circulatory complications: Secondary | ICD-10-CM

## 2021-03-17 ENCOUNTER — Ambulatory Visit: Payer: Medicare Other | Admitting: Cardiology

## 2021-04-03 ENCOUNTER — Other Ambulatory Visit (HOSPITAL_COMMUNITY): Payer: Self-pay

## 2021-04-03 ENCOUNTER — Other Ambulatory Visit (HOSPITAL_COMMUNITY): Payer: Self-pay | Admitting: Physician Assistant

## 2021-04-03 DIAGNOSIS — D61818 Other pancytopenia: Secondary | ICD-10-CM

## 2021-04-03 DIAGNOSIS — D509 Iron deficiency anemia, unspecified: Secondary | ICD-10-CM

## 2021-04-04 ENCOUNTER — Inpatient Hospital Stay (HOSPITAL_COMMUNITY): Payer: Medicare Other | Attending: Hematology

## 2021-04-04 DIAGNOSIS — E871 Hypo-osmolality and hyponatremia: Secondary | ICD-10-CM | POA: Insufficient documentation

## 2021-04-04 NOTE — Progress Notes (Signed)
?  ?Cardiology Office Note ? ? ?Date:  04/05/2021  ? ?ID:  [name], DOB 16-Sep-1965, MRN [id number] ? ?PCP:  Wilburt Finlay, MD  ?Cardiologist:   None ? ? ?Chief Complaint  ?Patient presents with  ? Atrial Fibrillation  ? ? ?  ?History of Present Illness: ?[name] is a 56 y.o. male who presents for follow up of diastolic heart failure and a history of atrial fibrillation.  He has diastolic HF.  Since I last saw him he has had a couple of hospitalizations and I was able to review these records in [place].  There have been some changes to his medicines and he is not taking Zaroxolyn or potassium or torsemide.  He has been on spironolactone.  He was in the hospital with knee problems and also with osteomyelitis and discitis.  He required aspiration and antibiotics.  He eventually required prolonged rehab.  He has been managed for chronic pain. ? ?Looking through all these records however there was no mention of any cardiac issues.  In particular he was not documented to have any atrial fibrillation.  There did not seem to be any volume issues.  He has to get around now for the most part in a wheelchair because of his knee problems particularly his right and he is not yet a candidate for surgery because of his weight.  He has not had any new shortness of breath, PND or orthopnea.  He does not really notice any palpitations, presyncope or syncope.  He is not having chest pressure, neck or arm discomfort.  He had no weight gain or edema. ? ? ?Past Medical History:  ?Diagnosis Date  ? Anxiety   ? Atrial fibrillation ([place])   ? Blood clot in vein   ? blood clot in portal vein  ? CHF (congestive heart failure) ([place])   ? Cirrhosis ([place])   ? NASH  ? Constipation   ? COPD (chronic obstructive pulmonary disease) ([place])   ? GERD (gastroesophageal reflux disease)   ? Gout   ? Leukopenia 07/08/2015  ? Neuromuscular disorder ([place])   ? neuropathy in hands and feet  ? Psoriasis   ? RA (rheumatoid arthritis)  ([place])   ? Sleep apnea   ? cpap used- level 10 and greater  ? Thrombocytopenia ([place])   ? ? ?Past Surgical History:  ?Procedure Laterality Date  ? ANKLE SURGERY    ? right ankle talor repair  ? CHOLECYSTECTOMY  sept 2016  ? CHOLECYSTECTOMY    ? COLONOSCOPY  05/08/2011  ? Procedure: COLONOSCOPY;  Surgeon: Rogene Houston, MD;  [facility];  Service: Endoscopy;  Laterality: N/A;  730  ? COLONOSCOPY WITH PROPOFOL N/A 05/27/2020  ? Procedure: COLONOSCOPY WITH PROPOFOL;  Surgeon: Harvel Quale, MD;  [facility];  Service: Gastroenterology;  Laterality: N/A;  Patient needs a unit of platelets prior to procedure.  ? ESOPHAGEAL BANDING N/A 05/27/2020  ? Procedure: ESOPHAGEAL BANDING;  Surgeon: Montez Morita, Quillian Quince, MD;  [facility];  Service: Gastroenterology;  Laterality: N/A;  ? ESOPHAGOGASTRODUODENOSCOPY  02/28/2011  ? Procedure: ESOPHAGOGASTRODUODENOSCOPY (EGD);  Surgeon: Rogene Houston, MD;  [facility];  Service: Endoscopy;  Laterality: N/A;  1200  ? ESOPHAGOGASTRODUODENOSCOPY N/A 06/11/2012  ? Procedure: ESOPHAGOGASTRODUODENOSCOPY (EGD);  Surgeon: Rogene Houston, MD;  [facility];  Service: Endoscopy;  Laterality: N/A;  1200  FYI patient is 400 pounds  ? ESOPHAGOGASTRODUODENOSCOPY (EGD) WITH PROPOFOL N/A  04/21/2014  ? Procedure: ESOPHAGOGASTRODUODENOSCOPY (EGD) WITH PROPOFOL;  Surgeon: Rogene Houston, MD;  [facility]: [facility];  Service: Endoscopy;  Laterality: N/A;  ? ESOPHAGOGASTRODUODENOSCOPY (EGD) WITH PROPOFOL N/A 05/27/2020  ? Procedure: ESOPHAGOGASTRODUODENOSCOPY (EGD) WITH PROPOFOL;  Surgeon: Harvel Quale, MD;  [facility];  Service: Gastroenterology;  Laterality: N/A;  7:30 am  ? ESOPHAGOGASTRODUODENOSCOPY (EGD) WITH PROPOFOL N/A 07/29/2020  ? Procedure: ESOPHAGOGASTRODUODENOSCOPY (EGD) WITH PROPOFOL;  Surgeon: Harvel Quale, MD;  [facility];  Service: Gastroenterology;  Laterality: N/A;  8:20   ? HERNIA REPAIR Right   ? as child; inguinal  ? HERNIA REPAIR  sept 2016  ? LIVER BIOPSY  2012  ? SCIATIC NERVE EXPLORATION    ? TONSILLECTOMY    ? ? ? ?Current Outpatient Medications  ?Medication Sig Dispense Refill  ? aspirin EC 325 MG tablet Take 162.5 mg by mouth every 6 (six) hours as needed for moderate pain.    ? baclofen (LIORESAL) 10 MG tablet TAKE 1 TABLET BY MOUTH THREE TIMES A DAY (Patient taking differently: Take 10 mg by mouth 3 (three) times daily.) 30 tablet 3  ? betamethasone dipropionate 0.05 % cream APPLY TO AFFECTED AREA TWICE A DAY (Patient taking differently: Apply 1 application. topically daily as needed (itching).) 45 g 3  ? busPIRone (BUSPAR) 10 MG tablet TAKE 1 TABLET BY MOUTH THREE TIMES A DAY (Patient taking differently: Take 10 mg by mouth 3 (three) times daily.) 90 tablet 3  ? colchicine 0.6 MG tablet Take 0.6 mg by mouth daily as needed (gout).    ? [employer] FOLIC ACID 672 MCG tablet TAKE 1 TABLET BY MOUTH EVERY DAY 90 tablet 4  ? diltiazem (CARDIZEM CD) 300 MG 24 hr capsule TAKE ONE CAPSULE BY MOUTH EVERY DAY 90 capsule 0  ? diltiazem (CARDIZEM SR) 90 MG 12 hr capsule Take 1 capsule (90 mg total) by mouth daily as needed. palpitations (Patient taking differently: Take 90 mg by mouth daily as needed (palpitations).) 30 capsule 3  ? DULoxetine (CYMBALTA) 60 MG capsule Take 60 mg by mouth daily.    ? esomeprazole (NEXIUM) 40 MG capsule TAKE 1 CAPSULE BY MOUTH EVERY DAY 90 capsule 1  ? ezetimibe (ZETIA) 10 MG tablet Take 1 tablet (10 mg total) by mouth daily. 30 tablet 5  ? fluticasone (FLONASE) 50 MCG/ACT nasal spray USE 2 SPRAYS IN EACH NOSTRIL EVERY DAY AS NEEDED (Patient taking differently: Place 2 sprays into both nostrils daily as needed for allergies.) 48 mL 1  ? gabapentin (NEURONTIN) 800 MG tablet Take 1 tablet (800 mg total) by mouth 3 (three) times daily. 270 tablet 3  ? indomethacin (INDOCIN) 50 MG capsule Take 50 mg by mouth 3 (three) times daily as needed for moderate pain.    ?  ipratropium (ATROVENT) 0.02 % nebulizer solution Take 0.5 mg by nebulization every 6 (six) hours as needed for wheezing or shortness of breath.     ? lactulose (CHRONULAC) 10 GM/15ML solution Take 30 mLs (20 g total) by mouth 2 (two) times daily. titate to 2-3 bowel movmeents per day 1892 mL 5  ? lidocaine (XYLOCAINE) 5 % ointment Apply 1 application topically as needed. 35.44 g 0  ? magnesium oxide (MAG-OX) 400 MG tablet TAKE 400 MG BY MOUTH DAILY. (Patient taking differently: Take 400 mg by mouth daily.) 30 tablet 1  ? Menthol-Methyl Salicylate (MUSCLE RUB) 10-15 % CREA Apply 1 application topically 3 (three) times daily as needed for muscle pain.  For muscle pain    ? Milk Thistle 1000 MG CAPS Take 1,000 mg by mouth in the morning, at noon, and at bedtime.    ? Multiple Vitamins-Minerals (MULTI COMPLETE PO) Take 1 tablet by mouth daily.     ? pravastatin (PRAVACHOL) 10 MG tablet Take 1 tablet (10 mg total) by mouth daily. 90 tablet 3  ? QUEtiapine (SEROQUEL) 100 MG tablet TAKE 1 TABLET BY MOUTH EVERYDAY AT BEDTIME (Patient taking differently: Take 50 mg by mouth at bedtime.) 90 tablet 1  ? spironolactone (ALDACTONE) 50 MG tablet Take 1 tablet (50 mg total) by mouth daily. 90 tablet 3  ? buprenorphine-naloxone (SUBOXONE) 8-2 mg SUBL SL tablet Place 0.5 tablets under the tongue 2 (two) times daily as needed (pain). (Patient not taking: Reported on 04/05/2021)    ? HYDROmorphone (DILAUDID) 2 MG tablet Take 2 mg by mouth 3 (three) times daily as needed for moderate pain. (Patient not taking: Reported on 04/05/2021)    ? metolazone (ZAROXOLYN) 2.5 MG tablet Take 1 tablet (2.5 mg total) by mouth 3 (three) times a week. (Patient not taking: Reported on 04/05/2021) 30 tablet 1  ? metoprolol tartrate (LOPRESSOR) 25 MG tablet Take 1 tablet (25 mg total) by mouth 2 (two) times daily. 180 tablet 3  ? OneTouch Delica Lancets 68E MISC Test BS QID Dx E11.9 400 each 3  ? torsemide (DEMADEX) 20 MG tablet Take 4 tablets (80 mg total)  by mouth 2 (two) times daily. (Patient not taking: Reported on 04/05/2021) 720 tablet 3  ? ?No current facility-administered medications for this visit.  ? ?Facility-Administered Medications Ordered in [place]

## 2021-04-05 ENCOUNTER — Ambulatory Visit (INDEPENDENT_AMBULATORY_CARE_PROVIDER_SITE_OTHER): Payer: Medicare Other | Admitting: Cardiology

## 2021-04-05 ENCOUNTER — Encounter: Payer: Self-pay | Admitting: Cardiology

## 2021-04-05 VITALS — BP 100/64 | HR 65 | Ht 71.0 in | Wt 342.0 lb

## 2021-04-05 DIAGNOSIS — I5032 Chronic diastolic (congestive) heart failure: Secondary | ICD-10-CM

## 2021-04-05 DIAGNOSIS — I480 Paroxysmal atrial fibrillation: Secondary | ICD-10-CM

## 2021-04-05 DIAGNOSIS — I10 Essential (primary) hypertension: Secondary | ICD-10-CM

## 2021-04-05 DIAGNOSIS — E785 Hyperlipidemia, unspecified: Secondary | ICD-10-CM

## 2021-04-05 DIAGNOSIS — E871 Hypo-osmolality and hyponatremia: Secondary | ICD-10-CM | POA: Diagnosis not present

## 2021-04-05 DIAGNOSIS — E1159 Type 2 diabetes mellitus with other circulatory complications: Secondary | ICD-10-CM | POA: Diagnosis not present

## 2021-04-05 NOTE — Patient Instructions (Addendum)
Medication Instructions:  ?Your physician has recommended you make the following change in your medication: ?STOP Voltaren ?STOP Potassium ? ?*If you need a refill on your cardiac medications before your next appointment, please call your pharmacy* ? ? ?Lab Work: ?None ordered ? ?Make sure to get BMET blood work with your primary doctor ? ? ?Testing/Procedures: ?None ordered ? ? ?Follow-Up: ?At [facility], you and your health needs are our priority.  As part of our continuing mission to provide you with exceptional heart care, we have created designated Provider Care Teams.  These Care Teams include your primary Cardiologist (physician) and Advanced Practice Providers (APPs -  Physician Assistants and Nurse Practitioners) who all work together to provide you with the care you need, when you need it. ? ?Your next appointment:   ?1 year(s) ? ?The format for your next appointment:   ?In Person ? ?Provider:   ?Minus Breeding, MD ? ? ? ?Thank you for choosing [facility]!! ? ? ?[phone] ?

## 2021-04-06 ENCOUNTER — Other Ambulatory Visit (HOSPITAL_COMMUNITY): Payer: Medicare Other

## 2021-04-11 ENCOUNTER — Ambulatory Visit (HOSPITAL_COMMUNITY): Payer: Medicare Other | Admitting: Physician Assistant

## 2021-04-13 ENCOUNTER — Other Ambulatory Visit: Payer: Self-pay

## 2021-04-13 ENCOUNTER — Telehealth: Payer: Self-pay | Admitting: Cardiology

## 2021-04-13 DIAGNOSIS — I5032 Chronic diastolic (congestive) heart failure: Secondary | ICD-10-CM

## 2021-04-13 NOTE — Telephone Encounter (Signed)
Patient is calling requesting an order for the BMET Dr. Percival Spanish requested at his last appointment be faxed to [facility]. He states he is not able to have the labs performed through his PCP. Requesting a callback notifying him once fax is sent.  ?

## 2021-04-13 NOTE — Telephone Encounter (Signed)
Contacted patient, LVM advising that [name] is now [facility], they do not use the same system we do- we can have him go to any [facility] near him. I can place the order for this.  ? ?

## 2021-04-17 ENCOUNTER — Other Ambulatory Visit: Payer: Self-pay | Admitting: *Deleted

## 2021-04-17 DIAGNOSIS — I5032 Chronic diastolic (congestive) heart failure: Secondary | ICD-10-CM

## 2021-04-18 NOTE — Telephone Encounter (Signed)
Spoke with patient to inform him he can do blood work at [facility]. He said he will go to [place] for the blood work.Marland Kitchen ?

## 2021-04-19 ENCOUNTER — Ambulatory Visit (INDEPENDENT_AMBULATORY_CARE_PROVIDER_SITE_OTHER): Payer: Medicare Other | Admitting: Pulmonary Disease

## 2021-04-19 ENCOUNTER — Institutional Professional Consult (permissible substitution): Payer: Medicare Other | Admitting: Pulmonary Disease

## 2021-04-19 ENCOUNTER — Encounter: Payer: Self-pay | Admitting: Pulmonary Disease

## 2021-04-19 DIAGNOSIS — J4520 Mild intermittent asthma, uncomplicated: Secondary | ICD-10-CM | POA: Diagnosis not present

## 2021-04-19 DIAGNOSIS — G4733 Obstructive sleep apnea (adult) (pediatric): Secondary | ICD-10-CM | POA: Diagnosis not present

## 2021-04-19 NOTE — Assessment & Plan Note (Signed)
He seems to be compliant with CPAP.  He received a new machine 2022. ?We will ask DME to provide [place] with a download and ensure that settings are okay, also objectively confirm compliance.  If he continues to lose weight, we can consider repeating a sleep study in the future ?Weight loss encouraged, compliance with goal of at least 4-6 hrs every night is the expectation. ?Advised against medications with sedative side effects ?Cautioned against driving when sleepy - understanding that sleepiness will vary on a day to day basis ? ?

## 2021-04-19 NOTE — Patient Instructions (Signed)
?  X Check CPAP download from [facility] ? ?Sleepiness could be due to pain meds, neurontin ?

## 2021-04-19 NOTE — Assessment & Plan Note (Signed)
Doubt true asthma here, previous spirometry does not show any evidence of airway obstruction. ?He has discontinued Advair and required inhaled steroid.  We did refill his albuterol to use on an as-needed basis ?

## 2021-04-19 NOTE — Progress Notes (Signed)
? ?Subjective:  ? ? Patient ID: [name], male    DOB: 22-Feb-1965, 56 y.o.   MRN: [id number] ? ?HPI ? ?Chief Complaint  ?Patient presents with  ? Consult  ?  Ref by Dr. Charm Rings for OSA.Per  patient he is already using a CPAP but does not follow a doctor for it currently.   ? ?56 year old morbidly obese man presents to reestablish care for OSA asthma. ?He was last seen in 2014, baseline NPSG showed severe OSA and he has been maintained on auto CPAP 10 to 20 cm, 2014 showed average pressure of 16 cm, DME is [facility] ? ?PMH -EtOH cirrhosis of liver ?HF PEF ?Atrial fibrillation ?Chronic pain ?Reviewed osteoarthritis ? ?He received a new machine in 2022 and this is working well he is very compliant and CPAP is helped improve his daytime somnolence and sleep. ?Epworth sleepiness score is 6 bedtime is between midnight and 2 AM, sleep latency about sleeps on his side with 2 pillows, reports 2-3 nocturnal awakenings and is out of bed between 10 AM and noon ?He has lost about 80 pounds to his current weight of 325 pounds and plans to use some more. ?He is contemplating knee replacement surgery but has been advised to lose weight. ?EGD 05/2020 was performed under propofol and he tolerated this well ?He had a prolonged hospitalization in 2022 for bacteremia and osteomyelitis ?He has self discontinued torsemide and Zaroxolyn, reviewed last cardiology consultation, continues on Aldactone ? ?Previous PFTs have not shown significant airway obstruction, he was treated for asthma, self discontinued Advair in 2014, remains on albuterol MDI and nebs as needed ? ? ? ?Significant tests/ events reviewed ? ?01/2007 PSG showed AHI 78/h - wt was 385 lbs  ? ?08/2009 spirometry >> no airway obstruction or restriction ?  ?PFTs 2011 do not show obstruction, FEV1 81%, TLC 76%, DLCO 70% corrects for alveolar volume  ? ? ?Past Medical History:  ?Diagnosis Date  ? Anxiety   ? Atrial fibrillation ([place])   ? Blood clot in vein   ? blood clot in  portal vein  ? CHF (congestive heart failure) ([place])   ? Cirrhosis ([place])   ? NASH  ? Constipation   ? COPD (chronic obstructive pulmonary disease) ([place])   ? GERD (gastroesophageal reflux disease)   ? Gout   ? Leukopenia 07/08/2015  ? Neuromuscular disorder ([place])   ? neuropathy in hands and feet  ? Psoriasis   ? RA (rheumatoid arthritis) ([place])   ? Sleep apnea   ? cpap used- level 10 and greater  ? Thrombocytopenia ([place])   ? ?Past Surgical History:  ?Procedure Laterality Date  ? ANKLE SURGERY    ? right ankle talor repair  ? CHOLECYSTECTOMY  sept 2016  ? CHOLECYSTECTOMY    ? COLONOSCOPY  05/08/2011  ? Procedure: COLONOSCOPY;  Surgeon: Rogene Houston, MD;  [facility];  Service: Endoscopy;  Laterality: N/A;  730  ? COLONOSCOPY WITH PROPOFOL N/A 05/27/2020  ? Procedure: COLONOSCOPY WITH PROPOFOL;  Surgeon: Harvel Quale, MD;  [facility];  Service: Gastroenterology;  Laterality: N/A;  Patient needs a unit of platelets prior to procedure.  ? ESOPHAGEAL BANDING N/A 05/27/2020  ? Procedure: ESOPHAGEAL BANDING;  Surgeon: Montez Morita, Quillian Quince, MD;  [facility];  Service: Gastroenterology;  Laterality: N/A;  ? ESOPHAGOGASTRODUODENOSCOPY  02/28/2011  ? Procedure: ESOPHAGOGASTRODUODENOSCOPY (EGD);  Surgeon: Rogene Houston, MD;  [facility];  Service: Endoscopy;  Laterality: N/A;  1200  ? ESOPHAGOGASTRODUODENOSCOPY N/A 06/11/2012  ? Procedure: ESOPHAGOGASTRODUODENOSCOPY (EGD);  Surgeon: Rogene Houston, MD;  [facility];  Service: Endoscopy;  Laterality: N/A;  1200  FYI patient is 400 pounds  ? ESOPHAGOGASTRODUODENOSCOPY (EGD) WITH PROPOFOL N/A 04/21/2014  ? Procedure: ESOPHAGOGASTRODUODENOSCOPY (EGD) WITH PROPOFOL;  Surgeon: Rogene Houston, MD;  [facility];  Service: Endoscopy;  Laterality: N/A;  ? ESOPHAGOGASTRODUODENOSCOPY (EGD) WITH PROPOFOL N/A 05/27/2020  ? Procedure: ESOPHAGOGASTRODUODENOSCOPY (EGD) WITH PROPOFOL;  Surgeon: Harvel Quale, MD;  [facility];  Service: Gastroenterology;  Laterality: N/A;  7:30 am  ? ESOPHAGOGASTRODUODENOSCOPY (EGD) WITH PROPOFOL N/A 07/29/2020  ? Procedure: ESOPHAGOGASTRODUODENOSCOPY (EGD) WITH PROPOFOL;  Surgeon: Harvel Quale, MD;  [facility];  Service: Gastroenterology;  Laterality: N/A;  8:20  ? HERNIA REPAIR Right   ? as child; inguinal  ? HERNIA REPAIR  sept 2016  ? LIVER BIOPSY  2012  ? SCIATIC NERVE EXPLORATION    ? TONSILLECTOMY    ? ? ?Allergies  ?Allergen Reactions  ? Allopurinol Other (See Comments)  ?  Joint pain worse   ? ? ?Social History  ? ?Socioeconomic History  ? Marital status: Divorced  ?  Spouse name: Not on file  ? Number of children: 1  ? Years of education: Not on file  ? Highest education level: Some college, no degree  ?Occupational History  ? Occupation: Disabled  ?Tobacco Use  ? Smoking status: Never  ? Smokeless tobacco: Current  ?  Types: Snuff, Chew  ? Tobacco comments:  ?  Pt reports that he "dips"  ?Vaping Use  ? Vaping Use: Never used  ?Substance and Sexual Activity  ? Alcohol use: Yes  ?  Alcohol/week: 4.0 standard drinks  ?  Types: 4 Shots of liquor per week  ?  Comment: last use 07/29/2020 2pm, one beer 24 oz  ? Drug use: No  ?  Comment: Use to smoke cocaine and marijuana. No IV drug use  ? Sexual activity: Yes  ?Other Topics Concern  ? Not on file  ?Social History Narrative  ? Not on file  ? ?Social Determinants of Health  ? ?Financial Resource Strain: Not on file  ?Food Insecurity: Not on file  ?Transportation Needs: Not on file  ?Physical Activity: Not on file  ?Stress: Not on file  ?Social Connections: Not on file  ?Intimate Partner Violence: Not on file  ? ? ? ?Family History  ?Problem Relation Age of Onset  ? Colon cancer Mother   ? Cancer Mother   ?     intestine  ? Cancer Father   ?     throat  ? Cancer Brother   ?     brain  ? Cancer Sister   ? Heart attack Neg Hx   ? Stroke Neg Hx   ? ? ? ? ?Review of Systems ?Weight loss ?Dental  problems ?Joint stiffness ? ?Constitutional: negative for anorexia, fevers and sweats  ?Eyes: negative for irritation, redness and visual disturbance  ?Ears, nose, mouth, throat, and face: negative for earaches, epistaxis, nasal congestion and sore throat  ?Respiratory: negative for cough, dyspnea on exertion, sputum and wheezing  ?Cardiovascular: negative for chest pain, dyspnea, lower extremity edema, orthopnea, palpitations and syncope  ?Gastrointestinal: negative for abdominal pain, constipation, diarrhea, melena, nausea and vomiting  ?Genitourinary:negative for dysuria, frequency and hematuria  ?Hematologic/lymphatic: negative for bleeding, easy bruising and lymphadenopathy  ?Musculoskeletal:negative for arthralgias, muscle weakness  ?Neurological: negative for coordination  problems, gait problems, headaches and weakness  ?Endocrine: negative for diabetic symptoms including polydipsia, polyuria and weight loss ? ? ?   ?Objective:  ? Physical Exam ? ?Gen. Pleasant, obese, in no distress, normal affect, in wheelchair ?ENT - no pallor,icterus, no post nasal drip, class 2 airway ?Neck: No JVD, no thyromegaly, no carotid bruits ?Lungs: no use of accessory muscles, no dullness to percussion, decreased without rales or rhonchi  ?Cardiovascular: Rhythm regular, heart sounds  normal, no murmurs or gallops, no peripheral edema ?Abdomen: soft and non-tender, no hepatosplenomegaly, BS normal. ?Musculoskeletal: No deformities, no cyanosis or clubbing ?Neuro:  alert, non focal, no tremors ? ? ? ?   ?Assessment & Plan:  ? ? ?

## 2021-04-24 ENCOUNTER — Telehealth: Payer: Self-pay | Admitting: Cardiology

## 2021-04-24 NOTE — Telephone Encounter (Signed)
Patient calling in to see if its okay for him to take viagra. Please advise ?

## 2021-04-24 NOTE — Telephone Encounter (Signed)
Patient is asking he can take viagra (generic). His last BP was 120/60. Unable to check it today. Denies orthostatic hypotension, dizziness, lightheadedness. Please advise. ?

## 2021-04-25 NOTE — Telephone Encounter (Signed)
Spoke with patient and gave him the following notation from Dr. Percival Spanish.[name] KitchenMarland Gray"He is not on nitrates or other meds that I prescribe that would be a contraindication.  I would suggest he run the list with his primary providers as well before they would prescribe it."    ?Patient had no other questions or concerns. ?

## 2021-05-22 ENCOUNTER — Inpatient Hospital Stay (HOSPITAL_COMMUNITY): Payer: Medicare Other | Attending: Hematology

## 2021-05-22 DIAGNOSIS — D509 Iron deficiency anemia, unspecified: Secondary | ICD-10-CM | POA: Diagnosis not present

## 2021-05-22 DIAGNOSIS — I81 Portal vein thrombosis: Secondary | ICD-10-CM | POA: Diagnosis not present

## 2021-05-22 DIAGNOSIS — D61818 Other pancytopenia: Secondary | ICD-10-CM | POA: Diagnosis not present

## 2021-05-22 DIAGNOSIS — K7460 Unspecified cirrhosis of liver: Secondary | ICD-10-CM | POA: Diagnosis not present

## 2021-05-22 DIAGNOSIS — R161 Splenomegaly, not elsewhere classified: Secondary | ICD-10-CM | POA: Diagnosis not present

## 2021-05-22 DIAGNOSIS — Z79899 Other long term (current) drug therapy: Secondary | ICD-10-CM | POA: Insufficient documentation

## 2021-05-22 LAB — CBC WITH DIFFERENTIAL/PLATELET
Abs Immature Granulocytes: 0.01 10*3/uL (ref 0.00–0.07)
Basophils Absolute: 0 10*3/uL (ref 0.0–0.1)
Basophils Relative: 1 %
Eosinophils Absolute: 0.2 10*3/uL (ref 0.0–0.5)
Eosinophils Relative: 9 %
HCT: 38.3 % — ABNORMAL LOW (ref 39.0–52.0)
Hemoglobin: 12.3 g/dL — ABNORMAL LOW (ref 13.0–17.0)
Immature Granulocytes: 0 %
Lymphocytes Relative: 18 %
Lymphs Abs: 0.4 10*3/uL — ABNORMAL LOW (ref 0.7–4.0)
MCH: 27.8 pg (ref 26.0–34.0)
MCHC: 32.1 g/dL (ref 30.0–36.0)
MCV: 86.5 fL (ref 80.0–100.0)
Monocytes Absolute: 0.2 10*3/uL (ref 0.1–1.0)
Monocytes Relative: 9 %
Neutro Abs: 1.5 10*3/uL — ABNORMAL LOW (ref 1.7–7.7)
Neutrophils Relative %: 63 %
Platelets: 38 10*3/uL — ABNORMAL LOW (ref 150–400)
RBC: 4.43 MIL/uL (ref 4.22–5.81)
RDW: 15.5 % (ref 11.5–15.5)
WBC: 2.4 10*3/uL — ABNORMAL LOW (ref 4.0–10.5)
nRBC: 0 % (ref 0.0–0.2)

## 2021-05-22 LAB — COMPREHENSIVE METABOLIC PANEL
ALT: 15 U/L (ref 0–44)
AST: 26 U/L (ref 15–41)
Albumin: 4.3 g/dL (ref 3.5–5.0)
Alkaline Phosphatase: 82 U/L (ref 38–126)
Anion gap: 8 (ref 5–15)
BUN: 20 mg/dL (ref 6–20)
CO2: 22 mmol/L (ref 22–32)
Calcium: 9.3 mg/dL (ref 8.9–10.3)
Chloride: 108 mmol/L (ref 98–111)
Creatinine, Ser: 0.82 mg/dL (ref 0.61–1.24)
GFR, Estimated: >60 mL/min (ref >60)
Glucose, Bld: 131 mg/dL — ABNORMAL HIGH (ref 70–99)
Potassium: 3.8 mmol/L (ref 3.5–5.1)
Sodium: 138 mmol/L (ref 135–145)
Total Bilirubin: 1.5 mg/dL — ABNORMAL HIGH (ref 0.3–1.2)
Total Protein: 7.6 g/dL (ref 6.5–8.1)

## 2021-05-22 LAB — IRON AND TIBC
Iron: 122 ug/dL (ref 45–182)
Saturation Ratios: 34 % (ref 17.9–39.5)
TIBC: 358 ug/dL (ref 250–450)
UIBC: 236 ug/dL

## 2021-05-22 LAB — FERRITIN: Ferritin: 68 ng/mL (ref 24–336)

## 2021-05-28 NOTE — Progress Notes (Signed)
[name], [place] [id number]   CLINIC:  Medical Oncology/Hematology  PCP:  Wilburt Finlay, MD [address] [place] [address] [phone]   REASON FOR VISIT: Follow-up for pancytopenia   CURRENT THERAPY: Close surveillance, intermittent IV iron (last Feraheme 04/05/2020 and 04/21/2020)   INTERVAL HISTORY:  [name] 56 y.o. male returns for routine follow-up of leukopenia/thrombocytopenia (secondary to cirrhosis and splenomegaly), as well as iron deficiency anemia (secondary to malabsorption in the setting of alcohol abuse and cirrhosis; possible slow chronic blood loss from varices and portal hypertensive gastropathy).  His last IV iron infusion was Feraheme on 04/05/2020 and 04/21/2020.  He has not required PRBC blood transfusions.  He was last seen in office by Tarri Abernethy PA-C on 03/23/2020, and has been lost to follow-up for the past year.  Over the past year while he was lost to follow-up, he has been hospitalized twice.  First hospitalization was from 08/30/2020 through 09/28/2020 for hepatic encephalopathy and E. coli bacteremia.  He was hospitalized again from 10/29/2020 through 11/06/2020 for acute E. coli osteomyelitis of his lumbar spine.  He reports that he spent several months in rehab following these hospitalizations and only recently was able to return home.  [name] reports easy bruising, but denies any petechial rash. He reports that he has had black stool for the past month, but has not seen his GI doctor for the past year.  He denies any hematochezia, hematuria, epistaxis, hemoptysis, or hematemesis. He has not had any infections since his spinal osteomyelitis in October 2022.  He reports that his energy is good today.  He denies any B symptoms such as fever, chills, night sweats, unintentional weight loss.  He denies any recent chest pain on exertion, shortness of breath on minimal exertion, presyncopal episodes, or  palpitations.  No pica.    He has 100% energy and 100% appetite. He endorses that he is maintaining a stable weight.   REVIEW OF SYSTEMS:  Review of Systems  Constitutional:  Negative for appetite change, chills, diaphoresis, fatigue, fever and unexpected weight change.  HENT:   Negative for lump/mass and nosebleeds.   Eyes:  Negative for eye problems.  Respiratory:  Negative for cough, hemoptysis and shortness of breath.   Cardiovascular:  Negative for chest pain, leg swelling and palpitations.  Gastrointestinal:  Positive for constipation. Negative for abdominal pain, blood in stool, diarrhea, nausea and vomiting.  Genitourinary:  Negative for hematuria.   Musculoskeletal:  Positive for arthralgias (right knee).  Skin: Negative.   Neurological:  Positive for numbness. Negative for dizziness, headaches and light-headedness.  Hematological:  Bruises/bleeds easily.  Psychiatric/Behavioral:  Positive for sleep disturbance.      PAST MEDICAL/SURGICAL HISTORY:  Past Medical History:  Diagnosis Date   Anxiety    Atrial fibrillation (HCC)    Blood clot in vein    blood clot in portal vein   CHF (congestive heart failure) (HCC)    Cirrhosis (HCC)    NASH   Constipation    COPD (chronic obstructive pulmonary disease) (HCC)    GERD (gastroesophageal reflux disease)    Gout    Leukopenia 07/08/2015   Neuromuscular disorder (HCC)    neuropathy in hands and feet   Psoriasis    RA (rheumatoid arthritis) ([facility])    Sleep apnea    cpap used- level 10 and greater   Thrombocytopenia ([facility])    Past Surgical History:  Procedure Laterality Date   ANKLE  SURGERY     right ankle talor repair   CHOLECYSTECTOMY  sept 2016   CHOLECYSTECTOMY     COLONOSCOPY  05/08/2011   Procedure: COLONOSCOPY;  Surgeon: Rogene Houston, MD;  [facility];  Service: Endoscopy;  Laterality: N/A;  730   COLONOSCOPY WITH PROPOFOL N/A 05/27/2020   Procedure: COLONOSCOPY WITH PROPOFOL;  Surgeon: Harvel Quale, MD;  [facility];  Service: Gastroenterology;  Laterality: N/A;  Patient needs a unit of platelets prior to procedure.   ESOPHAGEAL BANDING N/A 05/27/2020   Procedure: ESOPHAGEAL BANDING;  Surgeon: Harvel Quale, MD;  [facility];  Service: Gastroenterology;  Laterality: N/A;   ESOPHAGOGASTRODUODENOSCOPY  02/28/2011   Procedure: ESOPHAGOGASTRODUODENOSCOPY (EGD);  Surgeon: Rogene Houston, MD;  [facility];  Service: Endoscopy;  Laterality: N/A;  1200   ESOPHAGOGASTRODUODENOSCOPY N/A 06/11/2012   Procedure: ESOPHAGOGASTRODUODENOSCOPY (EGD);  Surgeon: Rogene Houston, MD;  [facility];  Service: Endoscopy;  Laterality: N/A;  1200  FYI patient is 400 pounds   ESOPHAGOGASTRODUODENOSCOPY (EGD) WITH PROPOFOL N/A 04/21/2014   Procedure: ESOPHAGOGASTRODUODENOSCOPY (EGD) WITH PROPOFOL;  Surgeon: Rogene Houston, MD;  Location: [facility];  Service: Endoscopy;  Laterality: N/A;   ESOPHAGOGASTRODUODENOSCOPY (EGD) WITH PROPOFOL N/A 05/27/2020   Procedure: ESOPHAGOGASTRODUODENOSCOPY (EGD) WITH PROPOFOL;  Surgeon: Harvel Quale, MD;  [facility];  Service: Gastroenterology;  Laterality: N/A;  7:30 am   ESOPHAGOGASTRODUODENOSCOPY (EGD) WITH PROPOFOL N/A 07/29/2020   Procedure: ESOPHAGOGASTRODUODENOSCOPY (EGD) WITH PROPOFOL;  Surgeon: Harvel Quale, MD;  [facility];  Service: Gastroenterology;  Laterality: N/A;  8:20   HERNIA REPAIR Right    as child; inguinal   HERNIA REPAIR  sept 2016   LIVER BIOPSY  2012   SCIATIC NERVE EXPLORATION     TONSILLECTOMY       SOCIAL HISTORY:  Social History   Socioeconomic History   Marital status: Divorced    Spouse name: Not on file   Number of children: 1   Years of education: Not on file   Highest education level: Some college, no degree  Occupational History   Occupation: Disabled  Tobacco Use   Smoking status: Never   Smokeless tobacco: Current     Types: Snuff, Chew   Tobacco comments:    Pt reports that he "dips"  Vaping Use   Vaping Use: Never used  Substance and Sexual Activity   Alcohol use: Yes    Alcohol/week: 4.0 standard drinks    Types: 4 Shots of liquor per week    Comment: last use 07/29/2020 2pm, one beer 24 oz   Drug use: No    Comment: Use to smoke cocaine and marijuana. No IV drug use   Sexual activity: Yes  Other Topics Concern   Not on file  Social History Narrative   Not on file   Social Determinants of Health   Financial Resource Strain: Not on file  Food Insecurity: Not on file  Transportation Needs: Not on file  Physical Activity: Not on file  Stress: Not on file  Social Connections: Not on file  Intimate Partner Violence: Not on file    FAMILY HISTORY:  Family History  Problem Relation Age of Onset   Colon cancer Mother    Cancer Mother        intestine   Cancer Father        throat   Cancer Brother  brain   Cancer Sister    Heart attack Neg Hx    Stroke Neg Hx     CURRENT MEDICATIONS:  Outpatient Encounter Medications as of 05/29/2021  Medication Sig Note   aspirin EC 325 MG tablet Take 162.5 mg by mouth every 6 (six) hours as needed for moderate pain.    baclofen (LIORESAL) 10 MG tablet TAKE 1 TABLET BY MOUTH THREE TIMES A DAY (Patient taking differently: Take 10 mg by mouth 3 (three) times daily.)    betamethasone dipropionate 0.05 % cream APPLY TO AFFECTED AREA TWICE A DAY (Patient taking differently: Apply 1 application. topically daily as needed (itching).)    buprenorphine-naloxone (SUBOXONE) 8-2 mg SUBL SL tablet Place 0.5 tablets under the tongue 2 (two) times daily as needed (pain).    busPIRone (BUSPAR) 10 MG tablet TAKE 1 TABLET BY MOUTH THREE TIMES A DAY (Patient taking differently: Take 10 mg by mouth 3 (three) times daily.)    colchicine 0.6 MG tablet Take 0.6 mg by mouth daily as needed (gout).    [employer] FOLIC ACID 169 MCG tablet TAKE 1 TABLET BY MOUTH EVERY DAY     diltiazem (CARDIZEM CD) 300 MG 24 hr capsule TAKE ONE CAPSULE BY MOUTH EVERY DAY    diltiazem (CARDIZEM SR) 90 MG 12 hr capsule Take 1 capsule (90 mg total) by mouth daily as needed. palpitations (Patient taking differently: Take 90 mg by mouth daily as needed (palpitations).)    DULoxetine (CYMBALTA) 60 MG capsule Take 60 mg by mouth daily.    esomeprazole (NEXIUM) 40 MG capsule TAKE 1 CAPSULE BY MOUTH EVERY DAY    ezetimibe (ZETIA) 10 MG tablet Take 1 tablet (10 mg total) by mouth daily.    fluticasone (FLONASE) 50 MCG/ACT nasal spray USE 2 SPRAYS IN EACH NOSTRIL EVERY DAY AS NEEDED (Patient taking differently: Place 2 sprays into both nostrils daily as needed for allergies.)    gabapentin (NEURONTIN) 800 MG tablet Take 1 tablet (800 mg total) by mouth 3 (three) times daily.    HYDROmorphone (DILAUDID) 2 MG tablet Take 2 mg by mouth 3 (three) times daily as needed for moderate pain.    indomethacin (INDOCIN) 50 MG capsule Take 50 mg by mouth 3 (three) times daily as needed for moderate pain.    ipratropium (ATROVENT) 0.02 % nebulizer solution Take 0.5 mg by nebulization every 6 (six) hours as needed for wheezing or shortness of breath.     lactulose (CHRONULAC) 10 GM/15ML solution Take 30 mLs (20 g total) by mouth 2 (two) times daily. titate to 2-3 bowel movmeents per day    lidocaine (XYLOCAINE) 5 % ointment Apply 1 application topically as needed.    magnesium oxide (MAG-OX) 400 MG tablet TAKE 400 MG BY MOUTH DAILY. (Patient taking differently: Take 400 mg by mouth daily.) 07/22/2020: Needs refill   Menthol-Methyl Salicylate (MUSCLE RUB) 10-15 % CREA Apply 1 application topically 3 (three) times daily as needed for muscle pain. For muscle pain    metolazone (ZAROXOLYN) 2.5 MG tablet Take 1 tablet (2.5 mg total) by mouth 3 (three) times a week. (Patient not taking: Reported on 04/19/2021)    metoprolol tartrate (LOPRESSOR) 25 MG tablet Take 1 tablet (25 mg total) by mouth 2 (two) times daily.     Milk Thistle 1000 MG CAPS Take 1,000 mg by mouth in the morning, at noon, and at bedtime.    Multiple Vitamins-Minerals (MULTI COMPLETE PO) Take 1 tablet by mouth daily.  OneTouch Delica Lancets 77A MISC Test BS QID Dx E11.9    pravastatin (PRAVACHOL) 10 MG tablet Take 1 tablet (10 mg total) by mouth daily.    QUEtiapine (SEROQUEL) 100 MG tablet TAKE 1 TABLET BY MOUTH EVERYDAY AT BEDTIME (Patient taking differently: Take 50 mg by mouth at bedtime.)    spironolactone (ALDACTONE) 50 MG tablet Take 1 tablet (50 mg total) by mouth daily.    torsemide (DEMADEX) 20 MG tablet Take 4 tablets (80 mg total) by mouth 2 (two) times daily. (Patient not taking: Reported on 04/19/2021)    Facility-Administered Encounter Medications as of 05/29/2021  Medication   0.9 %  sodium chloride infusion   ferumoxytol (FERAHEME) 510 mg in sodium chloride 0.9 % 100 mL IVPB    ALLERGIES:  Allergies  Allergen Reactions   Allopurinol Other (See Comments)    Joint pain worse      PHYSICAL EXAM:  ECOG PERFORMANCE STATUS: 2 - Symptomatic, <50% confined to bed  There were no vitals filed for this visit. There were no vitals filed for this visit. Physical Exam Constitutional:      Appearance: Normal appearance. He is obese.  HENT:     Head: Normocephalic and atraumatic.     Mouth/Throat:     Mouth: Mucous membranes are moist.  Eyes:     Extraocular Movements: Extraocular movements intact.     Pupils: Pupils are equal, round, and reactive to light.  Cardiovascular:     Rate and Rhythm: Normal rate and regular rhythm.     Pulses: Normal pulses.     Heart sounds: Normal heart sounds.  Pulmonary:     Effort: Pulmonary effort is normal.     Breath sounds: Normal breath sounds.  Abdominal:     General: Bowel sounds are normal.     Palpations: Abdomen is soft.     Tenderness: There is no abdominal tenderness.     Comments: Ventral hernia.  Musculoskeletal:        General: No swelling.     Right lower leg:  Edema present.     Left lower leg: Edema present.  Lymphadenopathy:     Cervical: No cervical adenopathy.  Skin:    General: Skin is warm and dry.     Findings: Bruising present.  Neurological:     General: No focal deficit present.     Mental Status: He is alert and oriented to person, place, and time.  Psychiatric:        Mood and Affect: Mood normal.        Behavior: Behavior normal.     LABORATORY DATA:  I have reviewed the labs as listed.  CBC    Component Value Date/Time   WBC 2.4 (L) 05/22/2021 1223   RBC 4.43 05/22/2021 1223   HGB 12.3 (L) 05/22/2021 1223   HGB 11.4 (L) 02/22/2020 1138   HCT 38.3 (L) 05/22/2021 1223   HCT 33.9 (L) 02/22/2020 1138   PLT 38 (L) 05/22/2021 1223   PLT 38 (LL) 02/22/2020 1138   MCV 86.5 05/22/2021 1223   MCV 79 02/22/2020 1138   MCH 27.8 05/22/2021 1223   MCHC 32.1 05/22/2021 1223   RDW 15.5 05/22/2021 1223   RDW 14.0 02/22/2020 1138   LYMPHSABS 0.4 (L) 05/22/2021 1223   LYMPHSABS 0.4 (L) 02/22/2020 1138   MONOABS 0.2 05/22/2021 1223   EOSABS 0.2 05/22/2021 1223   EOSABS 0.1 02/22/2020 1138   BASOSABS 0.0 05/22/2021 1223   BASOSABS 0.0 02/22/2020 1138  Latest Ref Rng & Units 05/22/2021   12:23 PM 07/27/2020   11:58 AM 06/21/2020    1:52 PM  CMP  Glucose 70 - 99 mg/dL 131   126   174    BUN 6 - 20 mg/dL 20   18   14     Creatinine 0.61 - 1.24 mg/dL 0.82   1.08   0.84    Sodium 135 - 145 mmol/L 138   134   135    Potassium 3.5 - 5.1 mmol/L 3.8   4.3   4.0    Chloride 98 - 111 mmol/L 108   101   106    CO2 22 - 32 mmol/L 22   25   23     Calcium 8.9 - 10.3 mg/dL 9.3   8.7   8.8    Total Protein 6.5 - 8.1 g/dL 7.6   6.9   7.0    Total Bilirubin 0.3 - 1.2 mg/dL 1.5   1.3   0.9    Alkaline Phos 38 - 126 U/L 82   89   86    AST 15 - 41 U/L 26   38   30    ALT 0 - 44 U/L 15   19   16       DIAGNOSTIC IMAGING:  I have independently reviewed the relevant imaging and discussed with the patient.  ASSESSMENT & PLAN: 1.   Pancytopenia, secondary to cirrhosis of the liver and splenomegaly - Secondary to cirrhosis and splenomegaly. - Status post bone marrow aspiration and biopsy on 10/24/2015 that demonstrated normocellular bone marrow with trilineage hematopoiesis and normal cytogenetics - Abdominal ultrasound performed on 03/02/2020 showed liver cirrhosis and marked splenomegaly, with spleen measuring 20.6 cm in length with a calculated volume 2334 mL - Nutritional panel from 06/21/2020 showed normal B12, normal folate; SPEP normal - Patient had platelet transfusion on 05/27/2020 prior to his EGD/colonoscopy - Reports easy bruising but denies petechial rash.  No major bleeding episodes  - No B symptoms or frequent infections.   - Most recent labs (05/22/2021): WBC 2.4/ANC 1.5/ALC 0.4, platelets 38, Hgb 12.3/MCV 86.5 - PLAN: We will continue to monitor closely with repeat labs and RTC in 4 months with PHONE visit. - If he has continued worsening of his thrombocytopenia (platelets persistently 30-40), we will refer to interventional radiology for splenic artery embolization  2. Portal vein thrombus - Abdominal ultrasound performed 03/02/2020 continues to show chronic portal vein thrombus described as a stable nonocclusive intraluminal thrombus within the main portal vein - History of GI bleeding while on warfarin for portal vein thrombus - Not a candidate for anticoagulation due to thrombocytopenia and bleeding risk  3.  Iron deficiency anemia - Likely secondary to malabsorption of nutrients in the setting of alcohol use and cirrhosis, may also have some slow chronic blood loss related to cirrhotic varices - Unable to tolerate oral iron - Has required intermittent IV infusions, with last IV Venofer given in March/April 2022 - Recent EGD (05/27/2020): Grade II esophageal varices without stigmata of recent bleeding, banded/eradicated during procedure; gastric antral vascular ectasia without bleeding; portal hypertensive  gastropathy - Recent colonoscopy (05/27/2020): two nonbleeding colonic angiodysplastic lesions; nonbleeding internal hemorrhoids; diverticulosis in sigmoid and descending colon - He is taking iron tablet daily. - He reports dark bowel movements for the past month.  He has not followed up with GI in over a year. - Most recent labs (05/22/2021): Hgb 12.3/MCV 86.5, ferritin 68, iron saturation 34% -  PLAN: No indication for IV iron at this time.  We will repeat labs and RTC in 4 months. - Continue daily iron tablet.  4. Liver cirrhosis - Follows with GI for hepatologic management - Receiving abdominal ultrasounds via GI every 6 months to screen for [facility], given his high risk status of developing hepatocellular carcinoma - PLAN: Patient has not seen GI in over a year.  He is strongly encouraged to follow-up with GI for ongoing management of his liver cirrhosis.   PLAN SUMMARY & DISPOSITION: Labs and phone visit in 4 months.  All questions were answered. The patient knows to call the clinic with any problems, questions or concerns.  Medical decision making: Moderate  Time spent on visit: I spent 20 minutes counseling the patient face to face. The total time spent in the appointment was 30 minutes and more than 50% was on counseling.   Harriett Rush, PA-C  6:06 PM 05/29/2021

## 2021-05-29 ENCOUNTER — Inpatient Hospital Stay (HOSPITAL_BASED_OUTPATIENT_CLINIC_OR_DEPARTMENT_OTHER): Payer: Medicare Other | Admitting: Physician Assistant

## 2021-05-29 VITALS — BP 105/68 | HR 60 | Temp 98.0°F | Resp 18 | Ht 72.0 in | Wt 326.3 lb

## 2021-05-29 DIAGNOSIS — D61818 Other pancytopenia: Secondary | ICD-10-CM | POA: Diagnosis not present

## 2021-05-29 DIAGNOSIS — D509 Iron deficiency anemia, unspecified: Secondary | ICD-10-CM

## 2021-05-29 DIAGNOSIS — D708 Other neutropenia: Secondary | ICD-10-CM | POA: Diagnosis not present

## 2021-05-29 DIAGNOSIS — D696 Thrombocytopenia, unspecified: Secondary | ICD-10-CM

## 2021-05-29 NOTE — Patient Instructions (Signed)
[place] at [facility] Discharge Instructions  You were seen today by Tarri Abernethy PA-C for your low blood cells, which are related to your underlying liver cirrhosis.  You do not need any specific treatment at this time, but if your platelets and white blood cells get much lower, we will send you for a procedure to have your spleen shrunk.  We will see you for repeat labs and follow-up visit (via telephone) in 4 months.  It is EXTREMELY IMPORTANT that you also continue to see your GI doctor for management of your liver cirrhosis.     Thank you for choosing [place] at [facility] to provide your oncology and hematology care.  To afford each patient quality time with our provider, please arrive at least 15 minutes before your scheduled appointment time.   If you have a lab appointment with the [facility] please come in thru the Main Entrance and check in at the main information desk.  You need to re-schedule your appointment should you arrive 10 or more minutes late.  We strive to give you quality time with our providers, and arriving late affects you and other patients whose appointments are after yours.  Also, if you no show three or more times for appointments you may be dismissed from the clinic at the providers discretion.     Again, thank you for choosing [facility]  Our hope is that these requests will decrease the amount of time that you wait before being seen by our physicians.       _____________________________________________________________  Should you have questions after your visit to [facility], please contact our office at [phone] and follow the prompts.  Our office hours are 8:00 a.m. and 4:30 p.m. Monday - Friday.  Please note that voicemails left after 4:00 p.m. may not be returned until the following business day.  We are closed weekends and major holidays.  You do have access to a  nurse 24-7, just call the main number to the clinic [phone] and do not press any options, hold on the line and a nurse will answer the phone.    For prescription refill requests, have your pharmacy contact our office and allow 72 hours.    Due to Covid, you will need to wear a mask upon entering the hospital. If you do not have a mask, a mask will be given to you at the Main Entrance upon arrival. For doctor visits, patients may have 1 support person age 58 or older with them. For treatment visits, patients can not have anyone with them due to social distancing guidelines and our immunocompromised population.

## 2021-05-31 ENCOUNTER — Encounter (HOSPITAL_COMMUNITY): Payer: Self-pay | Admitting: Hematology

## 2021-05-31 NOTE — Progress Notes (Signed)
Entered in error

## 2021-06-01 ENCOUNTER — Encounter: Payer: Self-pay | Admitting: Urology

## 2021-06-01 ENCOUNTER — Ambulatory Visit: Payer: Medicare Other | Admitting: Urology

## 2021-06-01 ENCOUNTER — Ambulatory Visit (INDEPENDENT_AMBULATORY_CARE_PROVIDER_SITE_OTHER): Payer: Medicare Other | Admitting: Urology

## 2021-06-01 VITALS — BP 128/81 | HR 79 | Ht 71.0 in | Wt 330.0 lb

## 2021-06-01 DIAGNOSIS — N529 Male erectile dysfunction, unspecified: Secondary | ICD-10-CM

## 2021-06-01 MED ORDER — SILDENAFIL CITRATE 20 MG PO TABS: ORAL_TABLET | ORAL | 11 refills | Status: DC

## 2021-06-01 NOTE — Progress Notes (Signed)
Assessment: 1. Organic impotence     Plan: Today I had a long discussion with the patient spending a total of 20 minutes discussing ED.  I discussed the pathophysiology, etiology, and natural history of ED as well as management options using a goal-oriented approach.  We discussed the following options: Medical therapy, vacuum erection device, penile injections, intraurethral suppository therapy (MUSE), and penile prosthesis. Patient educational materials concerning these options were given to the patient.  Rx for sildenafil 20 mg 2-5 tabs prn provided Return prn   Chief Complaint:  Chief Complaint  Patient presents with   Erectile Dysfunction    History of Present Illness:  [name] is a 56 y.o. year old male who is seen in consultation from Wilburt Finlay, MD for evaluation of erectile dysfunction.  He has had symptoms for a number of years.  He has not been sexually active.  He is getting married soon and is interested in treatment for erectile dysfunction.  He reports that he is unable to achieve a spontaneous erection.  No nocturnal or early morning erections.  No prior therapy.  No decrease in his libido. Risk factors include morbid obesity, chronic opioid use, lumbar disc disease, hypertension.  Past Medical History:  Past Medical History:  Diagnosis Date   Anxiety    Atrial fibrillation ([place])    Blood clot in vein    blood clot in portal vein   CHF (congestive heart failure) (HCC)    Cirrhosis (HCC)    NASH   Constipation    COPD (chronic obstructive pulmonary disease) (HCC)    GERD (gastroesophageal reflux disease)    Gout    Leukopenia 07/08/2015   Neuromuscular disorder (HCC)    neuropathy in hands and feet   Psoriasis    RA (rheumatoid arthritis) ([place])    Sleep apnea    cpap used- level 10 and greater   Thrombocytopenia ([place])     Past Surgical History:  Past Surgical History:  Procedure Laterality Date   ANKLE SURGERY     right ankle talor  repair   CHOLECYSTECTOMY  sept 2016   CHOLECYSTECTOMY     COLONOSCOPY  05/08/2011   Procedure: COLONOSCOPY;  Surgeon: Rogene Houston, MD;  [facility];  Service: Endoscopy;  Laterality: N/A;  730   COLONOSCOPY WITH PROPOFOL N/A 05/27/2020   Procedure: COLONOSCOPY WITH PROPOFOL;  Surgeon: Harvel Quale, MD;  [facility];  Service: Gastroenterology;  Laterality: N/A;  Patient needs a unit of platelets prior to procedure.   ESOPHAGEAL BANDING N/A 05/27/2020   Procedure: ESOPHAGEAL BANDING;  Surgeon: Harvel Quale, MD;  [facility];  Service: Gastroenterology;  Laterality: N/A;   ESOPHAGOGASTRODUODENOSCOPY  02/28/2011   Procedure: ESOPHAGOGASTRODUODENOSCOPY (EGD);  Surgeon: Rogene Houston, MD;  [facility];  Service: Endoscopy;  Laterality: N/A;  1200   ESOPHAGOGASTRODUODENOSCOPY N/A 06/11/2012   Procedure: ESOPHAGOGASTRODUODENOSCOPY (EGD);  Surgeon: Rogene Houston, MD;  [facility];  Service: Endoscopy;  Laterality: N/A;  1200  FYI patient is 400 pounds   ESOPHAGOGASTRODUODENOSCOPY (EGD) WITH PROPOFOL N/A 04/21/2014   Procedure: ESOPHAGOGASTRODUODENOSCOPY (EGD) WITH PROPOFOL;  Surgeon: Rogene Houston, MD;  Location: [facility];  Service: Endoscopy;  Laterality: N/A;   ESOPHAGOGASTRODUODENOSCOPY (EGD) WITH PROPOFOL N/A 05/27/2020   Procedure: ESOPHAGOGASTRODUODENOSCOPY (EGD) WITH PROPOFOL;  Surgeon: Harvel Quale, MD;  [facility];  Service: Gastroenterology;  Laterality: N/A;  7:30 am   ESOPHAGOGASTRODUODENOSCOPY (EGD) WITH  PROPOFOL N/A 07/29/2020   Procedure: ESOPHAGOGASTRODUODENOSCOPY (EGD) WITH PROPOFOL;  Surgeon: Harvel Quale, MD;  [facility];  Service: Gastroenterology;  Laterality: N/A;  8:20   HERNIA REPAIR Right    as child; inguinal   HERNIA REPAIR  sept 2016   LIVER BIOPSY  2012   SCIATIC NERVE EXPLORATION     TONSILLECTOMY      Allergies:  Allergies   Allergen Reactions   Allopurinol Other (See Comments)    Joint pain worse     Family History:  Family History  Problem Relation Age of Onset   Colon cancer Mother    Cancer Mother        intestine   Cancer Father        throat   Cancer Brother        brain   Cancer Sister    Heart attack Neg Hx    Stroke Neg Hx     Social History:  Social History   Tobacco Use   Smoking status: Never   Smokeless tobacco: Current    Types: Snuff, Chew   Tobacco comments:    Pt reports that he "dips"  Vaping Use   Vaping Use: Never used  Substance Use Topics   Alcohol use: Yes    Alcohol/week: 4.0 standard drinks    Types: 4 Shots of liquor per week    Comment: last use 07/29/2020 2pm, one beer 24 oz   Drug use: No    Comment: Use to smoke cocaine and marijuana. No IV drug use    Review of symptoms:  Constitutional:  Negative for unexplained weight loss, night sweats, fever, chills ENT:  Negative for nose bleeds, sinus pain, painful swallowing CV:  Negative for chest pain, shortness of breath, exercise intolerance, palpitations, loss of consciousness Resp:  Negative for cough, wheezing, shortness of breath GI:  Negative for nausea, vomiting, diarrhea, bloody stools GU:  Positives noted in HPI; otherwise negative for gross hematuria, dysuria, urinary incontinence Neuro:  Negative for seizures, poor balance, limb weakness, slurred speech Psych:  Negative for lack of energy, depression, anxiety Endocrine:  Negative for polydipsia, polyuria, symptoms of hypoglycemia (dizziness, hunger, sweating) Hematologic:  Negative for anemia, purpura, petechia, prolonged or excessive bleeding, use of anticoagulants  Allergic:  Negative for difficulty breathing or choking as a result of exposure to anything; no shellfish allergy; no allergic response (rash/itch) to materials, foods  Physical exam: BP 128/81   Pulse 79   Ht 5' 11"  (1.803 m)   Wt (!) 330 lb (149.7 kg)   BMI 46.03 kg/m  GENERAL  APPEARANCE:  Well appearing, well developed, well nourished, NAD HEENT: Atraumatic, Normocephalic, oropharynx clear. NECK: Supple without lymphadenopathy or thyromegaly. LUNGS: Clear to auscultation bilaterally. HEART: Regular Rate and Rhythm without murmurs, gallops, or rubs. ABDOMEN: Soft, non-tender, No Masses. EXTREMITIES: Without clubbing, cyanosis, or edema. NEUROLOGIC:  Alert and oriented x 3, in wheelchair, CN II-XII grossly intact.  MENTAL STATUS:  Appropriate. BACK:  Non-tender to palpation.  No CVAT SKIN:  Warm, dry and intact.   GU: Penis:  uncircumcised Meatus: Normal Scrotum: normal, no masses Testis: normal without masses bilateral   Results: None

## 2021-06-27 ENCOUNTER — Telehealth: Payer: Self-pay | Admitting: Cardiology

## 2021-06-27 NOTE — Telephone Encounter (Signed)
Pt c/o medication issue:  1. Name of Medication: propranolol (INDERAL) 20 MG tablet  2. How are you currently taking this medication (dosage and times per day)? Started taking it from pill pack unsure if it is once or twice a day  3. Are you having a reaction (difficulty breathing--STAT)? No   4. What is your medication issue? Patient is calling stating he went to [facility] last night and was advised to get back on this medication. He states he still had some in his pill packs so he has began taking it again, but is in need of a refill. Please advise.

## 2021-06-27 NOTE — Telephone Encounter (Signed)
Advised that propranolol is no longer on his medication list and nurse would need to contact [facility] for clarification on fill history. Verbalized understanding

## 2021-06-27 NOTE — Telephone Encounter (Signed)
Spoke with Summer, pharmacist with [facility] who says propranolol 20 mg QD last filled 05/11/2021 ([place]). Per Summer they have never filled metoprolol.

## 2021-06-28 ENCOUNTER — Telehealth (HOSPITAL_COMMUNITY): Payer: Self-pay | Admitting: *Deleted

## 2021-06-28 NOTE — Telephone Encounter (Signed)
Patient called stating that he has been vomiting with diarrhea and has been having panic attacks.  Requested that we run labs to diagnose the underlying issues.  Advised that he call PCP, however his provider is no longer at the practice.  Explained that this is not something that we would be able to treat, as we see him for pancytopenia and he will need to seek medical attention at Urgent care for assessment. Verbalized understanding.

## 2021-06-28 NOTE — Telephone Encounter (Signed)
I agree with the above.  This is unrelated to the pancytopenia that we are following him for and beyond the scope of what we see here.  Due to his significant underlying comorbidities, including advanced liver cirrhosis, it will be important for him to establish with a new PCP as soon as possible. In the meantime he should definitely be seen at urgent care.  Thanks!

## 2021-06-28 NOTE — Telephone Encounter (Signed)
Minus Breeding, MD  Merlene Laughter, RN Caller: Unspecified (Yesterday,  9:47 AM) I am not prescribing propranolol.   Patient informed and verbalized understanding of plan.

## 2021-06-29 ENCOUNTER — Ambulatory Visit: Payer: Self-pay | Admitting: *Deleted

## 2021-06-29 NOTE — Telephone Encounter (Signed)
  Chief Complaint: requesting to know if at home test for covid positive. SOB Symptoms: took covid at home test that has expired and shows a line under the T. C/o runny nose and shortness of breath that is noted due to COPD. No appetitie looser than normal bowels. Panic attack over the weekend and seen in ED.  Frequency: since weekend Pertinent Negatives: Patient denies chest pain difficulty breathing no fever Disposition: [] ED /[x] Urgent Care (no appt availability in office) / [] Appointment(In office/virtual)/ []  [facility]/ [] Home Care/ [] Refused Recommended Disposition /[] [place] [facility]/ []  Follow-up with PCP Additional Notes:   F/u with PCP or ED to retest for covid.   Reason for Disposition  [1] MODERATE longstanding difficulty breathing (e.g., speaks in phrases, SOB even at rest, pulse 100-120) AND [2] SAME as normal  Answer Assessment - Initial Assessment Questions 1. RESPIRATORY STATUS: "Describe your breathing?" (e.g., wheezing, shortness of breath, unable to speak, severe coughing)      Shortness of breath and reports he is always SOB  hx COPD 2. ONSET: "When did this breathing problem begin?"      Over the weekend thought was having panic attacks 3. PATTERN "Does the difficult breathing come and go, or has it been constant since it started?"      Na  4. SEVERITY: "How bad is your breathing?" (e.g., mild, moderate, severe)    - MILD: No SOB at rest, mild SOB with walking, speaks normally in sentences, can lie down, no retractions, pulse < 100.    - MODERATE: SOB at rest, SOB with minimal exertion and prefers to sit, cannot lie down flat, speaks in phrases, mild retractions, audible wheezing, pulse 100-120.    - SEVERE: Very SOB at rest, speaks in single words, struggling to breathe, sitting hunched forward, retractions, pulse > 120     Moderate and reports he is not in distress 5. RECURRENT SYMPTOM: "Have you had difficulty breathing before?" If Yes, ask: "When  was the last time?" and "What happened that time?"      Na  6. CARDIAC HISTORY: "Do you have any history of heart disease?" (e.g., heart attack, angina, bypass surgery, angioplasty)      CHF 7. LUNG HISTORY: "Do you have any history of lung disease?"  (e.g., pulmonary embolus, asthma, emphysema)     COPD 8. CAUSE: "What do you think is causing the breathing problem?"      Calling due to possible positive covid test  9. OTHER SYMPTOMS: "Do you have any other symptoms? (e.g., dizziness, runny nose, cough, chest pain, fever)     Runny nose, no appetitie, SOB 10. O2 SATURATION MONITOR:  "Do you use an oxygen saturation monitor (pulse oximeter) at home?" If Yes, "What is your reading (oxygen level) today?" "What is your usual oxygen saturation reading?" (e.g., 95%)       na 11. PREGNANCY: "Is there any chance you are pregnant?" "When was your last menstrual period?"       na 12. TRAVEL: "Have you traveled out of the country in the last month?" (e.g., travel history, exposures)       na  Protocols used: Breathing Difficulty-A-AH

## 2021-07-20 ENCOUNTER — Telehealth: Payer: Self-pay | Admitting: Pulmonary Disease

## 2021-07-20 MED ORDER — IPRATROPIUM BROMIDE 0.02 % IN SOLN: 0.5000 mg | Freq: Four times a day (QID) | RESPIRATORY_TRACT | 0 refills | Status: DC | PRN

## 2021-07-20 MED ORDER — ALBUTEROL SULFATE HFA 108 (90 BASE) MCG/ACT IN AERS: 2.0000 | INHALATION_SPRAY | Freq: Four times a day (QID) | RESPIRATORY_TRACT | 3 refills | Status: DC | PRN

## 2021-07-20 NOTE — Telephone Encounter (Signed)
Called [name] and spoke to Summer, she states smallest box of ipratropium is 75 mLs (30 doses).  Called and notified patient of size and refills and he voiced understanding. Nothing further needed.

## 2021-07-24 ENCOUNTER — Telehealth: Payer: Self-pay | Admitting: Pulmonary Disease

## 2021-07-24 NOTE — Telephone Encounter (Signed)
Called and spoke with pt letting him know that we would need to get him to come in for an appt to reassess to see if he does need O2 and he verbalized understanding. Scheduled pt an appt at [facility] office and made sure he was aware that he was coming to the [facility] office, not [place]. Provided pt with [place] office address. Nothing further needed.

## 2021-07-26 ENCOUNTER — Ambulatory Visit: Payer: Medicare Other | Admitting: Nurse Practitioner

## 2021-07-31 ENCOUNTER — Encounter: Payer: Self-pay | Admitting: Pulmonary Disease

## 2021-07-31 ENCOUNTER — Ambulatory Visit (INDEPENDENT_AMBULATORY_CARE_PROVIDER_SITE_OTHER): Payer: Medicare Other | Admitting: Pulmonary Disease

## 2021-07-31 DIAGNOSIS — J4520 Mild intermittent asthma, uncomplicated: Secondary | ICD-10-CM | POA: Diagnosis not present

## 2021-07-31 DIAGNOSIS — G4733 Obstructive sleep apnea (adult) (pediatric): Secondary | ICD-10-CM

## 2021-07-31 DIAGNOSIS — I5032 Chronic diastolic (congestive) heart failure: Secondary | ICD-10-CM

## 2021-07-31 NOTE — Patient Instructions (Signed)
  X CPAP titration study to see if you need oxygen   X Obtain CPAP download

## 2021-07-31 NOTE — Addendum Note (Signed)
Addended by: Fritzi Mandes D on: 07/31/2021 12:17 PM   Modules accepted: Orders

## 2021-07-31 NOTE — Assessment & Plan Note (Signed)
Continue albuterol as needed 

## 2021-07-31 NOTE — Assessment & Plan Note (Signed)
Compliant with Lasix. No evidence of fluid overload

## 2021-07-31 NOTE — Assessment & Plan Note (Signed)
He reports compliance with his CPAP machine. CPAP is only helped improve his daytime somnolence and fatigue.  We will confirm compliance with an objective download. Regarding use of oxygen, we will schedule titration study to see if he qualifies for oxygen  Weight loss encouraged, compliance with goal of at least 4-6 hrs every night is the expectation. Advised against medications with sedative side effects Cautioned against driving when sleepy - understanding that sleepiness will vary on a day to day basis

## 2021-07-31 NOTE — Progress Notes (Signed)
   Subjective:    Patient ID: [name], male    DOB: 07/01/1965, 56 y.o.   MRN: [id number]  HPI  56 year old morbidly obese man for FU of OSA &  asthma. baseline NPSG showed severe OSA and he has been maintained on auto CPAP 10 to 20 cm, 2014 showed average pressure of 16 cm, DME is [facility] -new machine in 2022    PMH -EtOH cirrhosis of liver HF PEF Atrial fibrillation Chronic pain -prolonged hospitalization in 2022 for bacteremia and osteomyelitis   Chief Complaint  Patient presents with   Follow-up    Patient is wanting to get o2 for night time but does not want a concentrator.    He reports compliance with CPAP.  Denies sleepiness in the daytime. He wonders if he would qualify for oxygen.  If so he would want a rather than oxygen concentrator since he goes out camping quite a bit. While he was in the hospital, they provided oxygen through the CPAP and he felt like he slept much better and was more refreshed He is a mouth breather and was unable to tolerate full facemask, uses a nasal mask.  Reports mild dryness. Weight is stable at 325 pounds.   He has severe knee osteoarthritis and uses crutches to walk and is in a wheelchair today  Significant tests/ events reviewed  01/2007 PSG showed AHI 78/h - wt was 385 lbs    08/2009 spirometry >> no airway obstruction or restriction   PFTs 2011 do not show obstruction, FEV1 81%, TLC 76%, DLCO 70% corrects for alveolar volume    Review of Systems neg for any significant sore throat, dysphagia, itching, sneezing, nasal congestion or excess/ purulent secretions, fever, chills, sweats, unintended wt loss, pleuritic or exertional cp, hempoptysis, orthopnea pnd or change in chronic leg swelling. Also denies presyncope, palpitations, heartburn, abdominal pain, nausea, vomiting, diarrhea or change in bowel or urinary habits, dysuria,hematuria, rash, arthralgias, visual complaints, headache, numbness weakness or ataxia.     Objective:    Physical Exam   Gen. Pleasant, obese, in no distress, normal affect , in wheelchair ENT - no pallor,icterus, no post nasal drip, class 2 airway Neck: No JVD, no thyromegaly, no carotid bruits Lungs: no use of accessory muscles, no dullness to percussion, decreased without rales or rhonchi  Cardiovascular: Rhythm regular, heart sounds  normal, no murmurs or gallops, no peripheral edema Abdomen: soft and non-tender, no hepatosplenomegaly, BS normal. Musculoskeletal: No deformities, no cyanosis or clubbing, walks with crutches Neuro:  alert, non focal, no tremors        Assessment & Plan:

## 2021-08-06 ENCOUNTER — Other Ambulatory Visit: Payer: Self-pay | Admitting: Cardiology

## 2021-08-06 DIAGNOSIS — I5032 Chronic diastolic (congestive) heart failure: Secondary | ICD-10-CM

## 2021-08-06 DIAGNOSIS — E1159 Type 2 diabetes mellitus with other circulatory complications: Secondary | ICD-10-CM

## 2021-08-06 DIAGNOSIS — I480 Paroxysmal atrial fibrillation: Secondary | ICD-10-CM

## 2021-08-06 DIAGNOSIS — I152 Hypertension secondary to endocrine disorders: Secondary | ICD-10-CM

## 2021-08-09 ENCOUNTER — Telehealth: Payer: Self-pay | Admitting: Pulmonary Disease

## 2021-08-09 NOTE — Telephone Encounter (Signed)
Called and notified patient and he voiced understanding. He states he will follow up with PCP. Nothing further needed.

## 2021-08-09 NOTE — Telephone Encounter (Signed)
Pt was seen in the ER for aspiration.  Was told to follow-up with Dr. Elsworth Soho or PCP.  Please advise.    Dr. Elsworth Soho please advise your next available in [place] office is 10/23/2021. Would you like pt to follow up with you or PCP?

## 2021-09-05 ENCOUNTER — Encounter: Payer: Medicare Other | Admitting: Pulmonary Disease

## 2021-09-12 ENCOUNTER — Other Ambulatory Visit: Payer: Self-pay | Admitting: Cardiology

## 2021-09-12 ENCOUNTER — Other Ambulatory Visit: Payer: Self-pay

## 2021-09-12 MED ORDER — SPIRONOLACTONE 50 MG PO TABS: 50.0000 mg | ORAL_TABLET | Freq: Every day | ORAL | 6 refills | Status: DC

## 2021-09-28 ENCOUNTER — Inpatient Hospital Stay: Payer: Medicare Other | Attending: Physician Assistant

## 2021-09-28 DIAGNOSIS — D61818 Other pancytopenia: Secondary | ICD-10-CM | POA: Insufficient documentation

## 2021-09-28 DIAGNOSIS — D509 Iron deficiency anemia, unspecified: Secondary | ICD-10-CM | POA: Insufficient documentation

## 2021-09-28 DIAGNOSIS — K7460 Unspecified cirrhosis of liver: Secondary | ICD-10-CM | POA: Insufficient documentation

## 2021-09-28 DIAGNOSIS — D708 Other neutropenia: Secondary | ICD-10-CM

## 2021-09-28 DIAGNOSIS — D696 Thrombocytopenia, unspecified: Secondary | ICD-10-CM

## 2021-09-29 DIAGNOSIS — K7460 Unspecified cirrhosis of liver: Secondary | ICD-10-CM | POA: Diagnosis not present

## 2021-09-29 DIAGNOSIS — D509 Iron deficiency anemia, unspecified: Secondary | ICD-10-CM | POA: Diagnosis not present

## 2021-09-29 DIAGNOSIS — D61818 Other pancytopenia: Secondary | ICD-10-CM | POA: Diagnosis not present

## 2021-09-29 LAB — CBC WITH DIFFERENTIAL/PLATELET
Abs Immature Granulocytes: 0.01 10*3/uL (ref 0.00–0.07)
Basophils Absolute: 0 10*3/uL (ref 0.0–0.1)
Basophils Relative: 2 %
Eosinophils Absolute: 0.2 10*3/uL (ref 0.0–0.5)
Eosinophils Relative: 9 %
HCT: 37.3 % — ABNORMAL LOW (ref 39.0–52.0)
Hemoglobin: 12.8 g/dL — ABNORMAL LOW (ref 13.0–17.0)
Immature Granulocytes: 1 %
Lymphocytes Relative: 27 %
Lymphs Abs: 0.5 10*3/uL — ABNORMAL LOW (ref 0.7–4.0)
MCH: 29.4 pg (ref 26.0–34.0)
MCHC: 34.3 g/dL (ref 30.0–36.0)
MCV: 85.7 fL (ref 80.0–100.0)
Monocytes Absolute: 0.1 10*3/uL (ref 0.1–1.0)
Monocytes Relative: 8 %
Neutro Abs: 0.9 10*3/uL — ABNORMAL LOW (ref 1.7–7.7)
Neutrophils Relative %: 53 %
Platelets: 39 10*3/uL — ABNORMAL LOW (ref 150–400)
RBC: 4.35 MIL/uL (ref 4.22–5.81)
RDW: 14.6 % (ref 11.5–15.5)
WBC: 1.7 10*3/uL — ABNORMAL LOW (ref 4.0–10.5)
nRBC: 0 % (ref 0.0–0.2)

## 2021-09-29 LAB — IRON AND TIBC
Iron: 64 ug/dL (ref 45–182)
Saturation Ratios: 19 % (ref 17.9–39.5)
TIBC: 330 ug/dL (ref 250–450)
UIBC: 266 ug/dL

## 2021-09-29 LAB — COMPREHENSIVE METABOLIC PANEL
ALT: 17 U/L (ref 0–44)
AST: 28 U/L (ref 15–41)
Albumin: 3.8 g/dL (ref 3.5–5.0)
Alkaline Phosphatase: 83 U/L (ref 38–126)
Anion gap: 8 (ref 5–15)
BUN: 13 mg/dL (ref 6–20)
CO2: 23 mmol/L (ref 22–32)
Calcium: 8.9 mg/dL (ref 8.9–10.3)
Chloride: 106 mmol/L (ref 98–111)
Creatinine, Ser: 0.88 mg/dL (ref 0.61–1.24)
GFR, Estimated: >60 mL/min (ref >60)
Glucose, Bld: 109 mg/dL — ABNORMAL HIGH (ref 70–99)
Potassium: 4.5 mmol/L (ref 3.5–5.1)
Sodium: 137 mmol/L (ref 135–145)
Total Bilirubin: 1.1 mg/dL (ref 0.3–1.2)
Total Protein: 6.6 g/dL (ref 6.5–8.1)

## 2021-09-29 LAB — LACTATE DEHYDROGENASE: LDH: 111 U/L (ref 98–192)

## 2021-09-29 LAB — FERRITIN: Ferritin: 65 ng/mL (ref 24–336)

## 2021-10-04 NOTE — Progress Notes (Addendum)
Virtual Visit via Telephone Note [facility]  I connected with [name]  on 10/05/21 at  3:37 PM by telephone and verified that I am speaking with the correct person using two identifiers.  Location: Patient: Home Provider: [facility]   I discussed the limitations, risks, security and privacy concerns of performing an evaluation and management service by telephone and the availability of in person appointments. I also discussed with the patient that there may be a patient responsible charge related to this service. The patient expressed understanding and agreed to proceed.  REASON FOR VISIT: Follow-up for pancytopenia   CURRENT THERAPY: Close surveillance, intermittent IV iron (last Feraheme 04/05/2020 and 04/21/2020)  INTERVAL HISTORY: [name] 56 year old male) is contacted today for follow-up of leukopenia/thrombocytopenia (secondary to cirrhosis and splenomegaly) and iron deficiency anemia (secondary to malabsorption in the setting of alcohol abuse and cirrhosis, possible slow chronic blood loss from varices and portal hypertensive gastropathy).  His last IV iron infusion was Feraheme on 04/05/2020 and 04/21/2020.  He has not required PRBC blood transfusions.  He was last seen in office by [name] Abernethy PA-C on 05/29/2021.  At today's visit, he reports feeling fair.  He denies any changes in his baseline health status or new diagnoses since his last visit.  [name] reports easy bruising, but denies any petechial rash.    He denies any recent signs of melena, hematochezia, hematuria, epistaxis, hemoptysis, or hematemesis.  He has not had any infections since his spinal osteomyelitis in October 2022.   He reports that his energy is slightly lower than normal lately today.  He denies any B symptoms such as fever, chills, night sweats, unintentional weight loss.  He denies any recent chest pain on exertion, shortness of breath on  minimal exertion, presyncopal episodes, or palpitations.  No pica.    He has 50% energy and 100% appetite. He endorses that he is maintaining a stable weight.    OBSERVATIONS/OBJECTIVE: Review of Systems  Constitutional:  Positive for malaise/fatigue. Negative for chills, diaphoresis, fever and weight loss.  Respiratory:  Negative for cough and shortness of breath.   Cardiovascular:  Negative for chest pain and palpitations.  Gastrointestinal:  Negative for abdominal pain, blood in stool, melena, nausea and vomiting.  Musculoskeletal:  Positive for joint pain.  Neurological:  Negative for dizziness and headaches.     PHYSICAL EXAM (per limitations of virtual telephone visit): The patient is alert and oriented x 3, exhibiting adequate mentation, good mood, and ability to speak in full sentences and execute sound judgement.   ASSESSMENT & PLAN: 1.  Pancytopenia, secondary to cirrhosis of the liver and splenomegaly - Secondary to cirrhosis and splenomegaly. - Status post bone marrow aspiration and biopsy on 10/24/2015 that demonstrated normocellular bone marrow with trilineage hematopoiesis and normal cytogenetics - Abdominal ultrasound performed on 03/02/2020 showed liver cirrhosis and marked splenomegaly, with spleen measuring 20.6 cm in length with a calculated volume 2334 mL - Nutritional panel from 06/21/2020 showed normal B12, normal folate; SPEP normal - Patient had platelet transfusion on 05/27/2020 prior to his EGD/colonoscopy - Reports easy bruising but denies petechial rash.  No major bleeding episodes - No B symptoms or frequent infections. - Most recent labs (09/29/2021): WBC 1.7/ANC 0.9/ALC 0.5, platelets 39, Hgb 12.8/MCV 85.7.  Normal LDH and CMP. - PLAN: We will continue to monitor closely with repeat labs and RTC in 4 months with office visit 1 week after labs. - We  will check B12/MMA next week per patient request. - If he has continued worsening of his thrombocytopenia  (platelets persistently 30-40), we will refer to interventional radiology for splenic artery embolization - Prior to referring for splenic artery embolization, recommend the patient reestablish care with his GI specialist to ensure that there is no contraindication for this procedure - ADDENDUM: Reach out to the patient's gastroenterologist (Dr. Jenetta Downer) who noted that he is familiar with patient's case, although patient has strong history of noncompliance.  Per Dr. Jenetta Downer, there should not be a contraindication for IR embolization from his standpoint.  His office will reach out to the patient to try to have him reestablish care with them.   2. Portal vein thrombus - Abdominal ultrasound performed 03/02/2020 continues to show chronic portal vein thrombus described as a stable nonocclusive intraluminal thrombus within the main portal vein - History of GI bleeding while on warfarin for portal vein thrombus - Not a candidate for anticoagulation due to thrombocytopenia, history of GI bleeding, and ongoing bleeding risk   3.  Iron deficiency anemia - Likely secondary to malabsorption of nutrients in the setting of alcohol use and cirrhosis, may also have some slow chronic blood loss related to cirrhotic varices - Unable to tolerate oral iron - Has required intermittent IV infusions, with last IV Venofer given in March/April 2022 - Recent EGD (05/27/2020): Grade II esophageal varices without stigmata of recent bleeding, banded/eradicated during procedure; gastric antral vascular ectasia without bleeding; portal hypertensive gastropathy - Recent colonoscopy (05/27/2020): two nonbleeding colonic angiodysplastic lesions; nonbleeding internal hemorrhoids; diverticulosis in sigmoid and descending colon - He is taking iron tablet daily. - He reports dark bowel movements for the past month.  He has not followed up with GI in over a year.   - Most recent labs (09/29/2021): Hgb 12.8/MCV 85.7, ferritin 65, iron  saturation 19% - PLAN: No indication for IV iron at this time.  We will repeat labs and RTC in 4 months. - Continue daily iron tablet.   4. Liver cirrhosis - Follows with GI for hepatologic management - Receiving abdominal ultrasounds via GI every 6 months to screen for [facility], given his high risk status of developing hepatocellular carcinoma - PLAN: Patient has not seen GI in over a year.  He is strongly encouraged to follow-up with GI for ongoing management of his liver cirrhosis.   FOLLOW UP INSTRUCTIONS: Labs and office visit in 4 months     I discussed the assessment and treatment plan with the patient. The patient was provided an opportunity to ask questions and all were answered. The patient agreed with the plan and demonstrated an understanding of the instructions.   The patient was advised to call back or seek an in-person evaluation if the symptoms worsen or if the condition fails to improve as anticipated.  I provided 24 minutes of non-face-to-face time during this encounter.   Harriett Rush, PA-C 10/07/2021 1:35 AM

## 2021-10-05 ENCOUNTER — Encounter: Payer: Self-pay | Admitting: Physician Assistant

## 2021-10-05 ENCOUNTER — Inpatient Hospital Stay (HOSPITAL_BASED_OUTPATIENT_CLINIC_OR_DEPARTMENT_OTHER): Payer: Medicare Other | Admitting: Physician Assistant

## 2021-10-05 DIAGNOSIS — D509 Iron deficiency anemia, unspecified: Secondary | ICD-10-CM

## 2021-10-05 DIAGNOSIS — D696 Thrombocytopenia, unspecified: Secondary | ICD-10-CM | POA: Diagnosis not present

## 2021-10-05 DIAGNOSIS — D61818 Other pancytopenia: Secondary | ICD-10-CM | POA: Diagnosis not present

## 2021-10-06 ENCOUNTER — Other Ambulatory Visit: Payer: Self-pay

## 2021-10-06 DIAGNOSIS — D696 Thrombocytopenia, unspecified: Secondary | ICD-10-CM

## 2021-10-06 DIAGNOSIS — D61818 Other pancytopenia: Secondary | ICD-10-CM

## 2021-10-07 ENCOUNTER — Encounter (HOSPITAL_COMMUNITY): Payer: Self-pay | Admitting: Hematology

## 2021-10-09 ENCOUNTER — Inpatient Hospital Stay: Payer: Medicare Other | Attending: Hematology | Admitting: Physician Assistant

## 2021-10-09 DIAGNOSIS — D696 Thrombocytopenia, unspecified: Secondary | ICD-10-CM | POA: Diagnosis not present

## 2021-10-09 DIAGNOSIS — D509 Iron deficiency anemia, unspecified: Secondary | ICD-10-CM

## 2021-10-09 DIAGNOSIS — D61818 Other pancytopenia: Secondary | ICD-10-CM

## 2021-10-09 LAB — VITAMIN B12: Vitamin B-12: 288 pg/mL (ref 180–914)

## 2021-10-12 LAB — METHYLMALONIC ACID, SERUM: Methylmalonic Acid, Quantitative: 331 nmol/L (ref 0–378)

## 2021-10-16 ENCOUNTER — Ambulatory Visit (INDEPENDENT_AMBULATORY_CARE_PROVIDER_SITE_OTHER): Payer: Medicare Other | Admitting: Gastroenterology

## 2021-10-16 NOTE — Progress Notes (Addendum)
NURSE POOL: [name] had requested that I check his vitamin B12 to see if he would benefit from vitamin B12 injections.  Please call and inform him that his vitamin B12 is on the lower end of normal, but not low enough that he would need any B12 injections.  I would recommend that he start taking over-the-counter vitamin B12 500 mcg 3 times weekly.  I will recheck his B12 labs again at his follow-up visit in January.

## 2021-10-17 ENCOUNTER — Other Ambulatory Visit: Payer: Self-pay | Admitting: Cardiology

## 2021-10-17 ENCOUNTER — Other Ambulatory Visit: Payer: Self-pay | Admitting: Physician Assistant

## 2021-10-17 ENCOUNTER — Encounter (HOSPITAL_COMMUNITY): Payer: Self-pay | Admitting: Hematology

## 2021-10-17 ENCOUNTER — Other Ambulatory Visit: Payer: Self-pay | Admitting: *Deleted

## 2021-10-17 DIAGNOSIS — Z9189 Other specified personal risk factors, not elsewhere classified: Secondary | ICD-10-CM

## 2021-10-17 DIAGNOSIS — E1165 Type 2 diabetes mellitus with hyperglycemia: Secondary | ICD-10-CM

## 2021-10-17 MED ORDER — B-12 500 MCG PO TABS: 1.0000 | ORAL_TABLET | ORAL | 0 refills | Status: DC

## 2021-10-17 NOTE — Progress Notes (Signed)
Patient called and made aware of suggestions given by Tarri Abernethy PA-C.  Verbalized understanding.

## 2021-10-27 ENCOUNTER — Ambulatory Visit: Payer: Medicare Other | Attending: Pulmonary Disease | Admitting: Pulmonary Disease

## 2021-10-27 DIAGNOSIS — G4733 Obstructive sleep apnea (adult) (pediatric): Secondary | ICD-10-CM | POA: Diagnosis not present

## 2021-11-09 DIAGNOSIS — G4733 Obstructive sleep apnea (adult) (pediatric): Secondary | ICD-10-CM

## 2021-11-09 NOTE — Procedures (Signed)
Patient Name: [name], [name] Date: 10/27/2021 Gender: Male D.O.B: [birth year] Age (years): 71 Referring Provider: Kara Mead MD, ABSM Height (inches): 71 Interpreting Physician: Kara Mead MD, ABSM Weight (lbs): 339 RPSGT: Peak, Robert BMI: 47 MRN: [id number] Neck Size: <br> <br> <br> CLINICAL INFORMATION The patient is referred for a CPAP titration to treat sleep apnea.    01/2007 PSG showed AHI 78/h - wt was 385 lbs  SLEEP STUDY TECHNIQUE As per the AASM Manual for the Scoring of Sleep and Associated Events v2.3 (April 2016) with a hypopnea requiring 4% desaturations.  The channels recorded and monitored were frontal, central and occipital EEG, electrooculogram (EOG), submentalis EMG (chin), nasal and oral airflow, thoracic and abdominal wall motion, anterior tibialis EMG, snore microphone, electrocardiogram, and pulse oximetry. Continuous positive airway pressure (CPAP) was initiated at the beginning of the study and titrated to treat sleep-disordered breathing.  MEDICATIONS Medications self-administered by patient taken the night of the study : N/A  TECHNICIAN COMMENTS Comments added by technician: Patient had difficulty initiating sleep. Comments added by scorer: N/A RESPIRATORY PARAMETERS Optimal PAP Pressure (cm): 11 AHI at Optimal Pressure (/hr): 1.4 Overall Minimal O2 (%): 79.00 Supine % at Optimal Pressure (%): 100 Minimal O2 at Optimal Pressure (%): 79.0   SLEEP ARCHITECTURE The study was initiated at 10:44:00 PM and ended at 5:07:34 AM.  Sleep onset time was 34.3 minutes and the sleep efficiency was 87.3%. The total sleep time was 335 minutes.  The patient spent 2.24% of the night in stage N1 sleep, 92.39% in stage N2 sleep, 0.00% in stage N3 and 5.4% in REM.Stage REM latency was 102.0 minutes  Wake after sleep onset was 14.3. Alpha intrusion was absent. Supine sleep was 100.00%.  CARDIAC DATA The 2 lead EKG demonstrated sinus rhythm. The mean heart  rate was 72.15 beats per minute. Other EKG findings include: None.   LEG MOVEMENT DATA The total Periodic Limb Movements of Sleep (PLMS) were 0. The PLMS index was 0.00. A PLMS index of <15 is considered normal in adults.  IMPRESSIONS - The optimal PAP pressure was 11 cm of water. - Central sleep apnea was not noted during this titration (CAI = 1.4/h). - Severe oxygen desaturations were observed during this titration (min O2 = 79.00%). - No snoring was audible during this study. - No cardiac abnormalities were observed during this study. - Clinically significant periodic limb movements were not noted during this study. Arousals associated with PLMs were rare.   DIAGNOSIS - Obstructive Sleep Apnea (G47.33)   RECOMMENDATIONS - Trial of CPAP therapy on 11 cm H2O with a Medium size Resmed Nasal Cradle AirFit N30 mask and heated humidification. - Avoid alcohol, sedatives and other CNS depressants that may worsen sleep apnea and disrupt normal sleep architecture. - Sleep hygiene should be reviewed to assess factors that may improve sleep quality. - Weight management and regular exercise should be initiated or continued. - Return to [facility] for re-evaluation after 4 weeks of therapy   Kara Mead MD Board Certified in [place]

## 2021-11-13 ENCOUNTER — Telehealth: Payer: Self-pay | Admitting: Pulmonary Disease

## 2021-11-13 NOTE — Telephone Encounter (Signed)
Called and notified patient of response from Dr. Elsworth Soho and he voiced understanding, He declines full face mask and humidity increase.    Patient states he has had increased fatigue (discussed at last ov) and wants to know if Dr. Elsworth Soho has any other testing or  suggestions he can try to help him with his increased daytime sleepiness. Dr. Elsworth Soho please advise

## 2021-11-13 NOTE — Telephone Encounter (Signed)
ATC patient. No answer  Left detailed vm on cell phone (ok per dpr) with results. Advised patient to call back for questions.

## 2021-11-13 NOTE — Telephone Encounter (Signed)
Atc lmtcb

## 2021-11-13 NOTE — Telephone Encounter (Signed)
Patient returned call.     [name] Noel, MD 11/09/2021  3:58 PM EDT     CPAP of 11 cm. Oxygen was required. He can continue on current auto CPAP settings   Called and spoke to patient, he states he was not placed on O2 during study and wants to know if he can have O2 ordered for him to use at night. He believes this will help him with his daytime sleepiness he is still having.    He also states he has been sleeping with his mouth open a lot at night time and waking up with dry mouth.

## 2021-11-14 ENCOUNTER — Telehealth: Payer: Self-pay | Admitting: Cardiology

## 2021-11-14 MED ORDER — DILTIAZEM HCL ER 90 MG PO CP12: 90.0000 mg | ORAL_CAPSULE | Freq: Every day | ORAL | 3 refills | Status: DC | PRN

## 2021-11-14 NOTE — Telephone Encounter (Signed)
Pt returning call

## 2021-11-14 NOTE — Addendum Note (Signed)
Addended by: Betha Loa F on: 11/14/2021 03:20 PM   Modules accepted: Orders

## 2021-11-14 NOTE — Telephone Encounter (Signed)
Pt called in stating he forgot to wear his cpap machine last night and he woke up with an irregular heart beat. He states he took his 372m cardizem but he had some palpitations after he took them. He misplaced his 936mtablets of cardizem that he was told to take if he had any palpitations so a refill request was sent in for that. Pt just wanted to make Dr. HoPercival Spanishware of his palpitations episode

## 2021-11-14 NOTE — Telephone Encounter (Signed)
Atc patient.

## 2021-11-14 NOTE — Telephone Encounter (Signed)
*  STAT* If patient is at the pharmacy, call can be transferred to refill team.   1. Which medications need to be refilled? (please list name of each medication and dose if known)   diltiazem (CARDIZEM SR) 90 MG 12 hr capsule    2. Which pharmacy/location (including street and city if local pharmacy) is medication to be sent to?  [place], [place] - [address]    3. Do they need a 30 day or 90 day supply? [facility]

## 2021-11-14 NOTE — Telephone Encounter (Signed)
Patient stated he fell asleep with out CPAP on last night. Got up late and had to hurry to work. He took Diltiazem 374m, but had breakthrough of palpitations later. He did not have the diltiazem 9107m Refill sent to his preferred pharmacy.

## 2021-11-14 NOTE — Telephone Encounter (Signed)
LMTCB

## 2021-12-21 ENCOUNTER — Ambulatory Visit (INDEPENDENT_AMBULATORY_CARE_PROVIDER_SITE_OTHER): Payer: Medicare Other | Admitting: Gastroenterology

## 2022-01-09 ENCOUNTER — Inpatient Hospital Stay: Payer: 59 | Attending: Hematology

## 2022-01-09 DIAGNOSIS — D61818 Other pancytopenia: Secondary | ICD-10-CM | POA: Insufficient documentation

## 2022-01-09 DIAGNOSIS — K7460 Unspecified cirrhosis of liver: Secondary | ICD-10-CM | POA: Diagnosis not present

## 2022-01-09 DIAGNOSIS — R262 Difficulty in walking, not elsewhere classified: Secondary | ICD-10-CM | POA: Diagnosis not present

## 2022-01-09 DIAGNOSIS — R161 Splenomegaly, not elsewhere classified: Secondary | ICD-10-CM | POA: Insufficient documentation

## 2022-01-09 DIAGNOSIS — D696 Thrombocytopenia, unspecified: Secondary | ICD-10-CM | POA: Insufficient documentation

## 2022-01-09 DIAGNOSIS — M170 Bilateral primary osteoarthritis of knee: Secondary | ICD-10-CM | POA: Diagnosis not present

## 2022-01-09 DIAGNOSIS — D509 Iron deficiency anemia, unspecified: Secondary | ICD-10-CM | POA: Insufficient documentation

## 2022-01-09 DIAGNOSIS — M6281 Muscle weakness (generalized): Secondary | ICD-10-CM | POA: Diagnosis not present

## 2022-01-09 DIAGNOSIS — Z79899 Other long term (current) drug therapy: Secondary | ICD-10-CM | POA: Diagnosis not present

## 2022-01-09 LAB — IRON AND TIBC
Iron: 164 ug/dL (ref 45–182)
Saturation Ratios: 44 % — ABNORMAL HIGH (ref 17.9–39.5)
TIBC: 375 ug/dL (ref 250–450)
UIBC: 211 ug/dL

## 2022-01-09 LAB — COMPREHENSIVE METABOLIC PANEL
ALT: 29 U/L (ref 0–44)
AST: 53 U/L — ABNORMAL HIGH (ref 15–41)
Albumin: 3.8 g/dL (ref 3.5–5.0)
Alkaline Phosphatase: 118 U/L (ref 38–126)
Anion gap: 9 (ref 5–15)
BUN: 14 mg/dL (ref 6–20)
CO2: 28 mmol/L (ref 22–32)
Calcium: 9.3 mg/dL (ref 8.9–10.3)
Chloride: 100 mmol/L (ref 98–111)
Creatinine, Ser: 0.78 mg/dL (ref 0.61–1.24)
GFR, Estimated: >60 mL/min (ref >60)
Glucose, Bld: 110 mg/dL — ABNORMAL HIGH (ref 70–99)
Potassium: 4.1 mmol/L (ref 3.5–5.1)
Sodium: 137 mmol/L (ref 135–145)
Total Bilirubin: 1.5 mg/dL — ABNORMAL HIGH (ref 0.3–1.2)
Total Protein: 7.7 g/dL (ref 6.5–8.1)

## 2022-01-09 LAB — CBC WITH DIFFERENTIAL/PLATELET
Abs Immature Granulocytes: 0.02 10*3/uL (ref 0.00–0.07)
Basophils Absolute: 0.1 10*3/uL (ref 0.0–0.1)
Basophils Relative: 2 %
Eosinophils Absolute: 0.2 10*3/uL (ref 0.0–0.5)
Eosinophils Relative: 8 %
HCT: 44.2 % (ref 39.0–52.0)
Hemoglobin: 14.5 g/dL (ref 13.0–17.0)
Immature Granulocytes: 1 %
Lymphocytes Relative: 18 %
Lymphs Abs: 0.5 10*3/uL — ABNORMAL LOW (ref 0.7–4.0)
MCH: 29 pg (ref 26.0–34.0)
MCHC: 32.8 g/dL (ref 30.0–36.0)
MCV: 88.4 fL (ref 80.0–100.0)
Monocytes Absolute: 0.3 10*3/uL (ref 0.1–1.0)
Monocytes Relative: 10 %
Neutro Abs: 1.8 10*3/uL (ref 1.7–7.7)
Neutrophils Relative %: 61 %
Platelets: 62 10*3/uL — ABNORMAL LOW (ref 150–400)
RBC: 5 MIL/uL (ref 4.22–5.81)
RDW: 13.9 % (ref 11.5–15.5)
WBC: 3 10*3/uL — ABNORMAL LOW (ref 4.0–10.5)
nRBC: 0 % (ref 0.0–0.2)

## 2022-01-09 LAB — FOLATE: Folate: 34.7 ng/mL (ref >5.9)

## 2022-01-09 LAB — IMMATURE PLATELET FRACTION: Immature Platelet Fraction: 4.6 % (ref 1.2–8.6)

## 2022-01-09 LAB — FERRITIN: Ferritin: 74 ng/mL (ref 24–336)

## 2022-01-09 LAB — LACTATE DEHYDROGENASE: LDH: 130 U/L (ref 98–192)

## 2022-01-09 LAB — VITAMIN B12: Vitamin B-12: 550 pg/mL (ref 180–914)

## 2022-01-11 DIAGNOSIS — M6281 Muscle weakness (generalized): Secondary | ICD-10-CM | POA: Diagnosis not present

## 2022-01-11 DIAGNOSIS — M170 Bilateral primary osteoarthritis of knee: Secondary | ICD-10-CM | POA: Diagnosis not present

## 2022-01-11 DIAGNOSIS — R262 Difficulty in walking, not elsewhere classified: Secondary | ICD-10-CM | POA: Diagnosis not present

## 2022-01-11 LAB — METHYLMALONIC ACID, SERUM: Methylmalonic Acid, Quantitative: 296 nmol/L (ref 0–378)

## 2022-01-15 NOTE — Progress Notes (Deleted)
Reschedule

## 2022-01-16 ENCOUNTER — Inpatient Hospital Stay: Payer: 59 | Admitting: Physician Assistant

## 2022-01-17 DIAGNOSIS — M170 Bilateral primary osteoarthritis of knee: Secondary | ICD-10-CM | POA: Diagnosis not present

## 2022-01-17 DIAGNOSIS — R262 Difficulty in walking, not elsewhere classified: Secondary | ICD-10-CM | POA: Diagnosis not present

## 2022-01-17 DIAGNOSIS — M6281 Muscle weakness (generalized): Secondary | ICD-10-CM | POA: Diagnosis not present

## 2022-01-23 DIAGNOSIS — R262 Difficulty in walking, not elsewhere classified: Secondary | ICD-10-CM | POA: Diagnosis not present

## 2022-01-23 DIAGNOSIS — M6281 Muscle weakness (generalized): Secondary | ICD-10-CM | POA: Diagnosis not present

## 2022-01-23 DIAGNOSIS — M170 Bilateral primary osteoarthritis of knee: Secondary | ICD-10-CM | POA: Diagnosis not present

## 2022-01-24 DIAGNOSIS — R5381 Other malaise: Secondary | ICD-10-CM | POA: Diagnosis not present

## 2022-01-24 DIAGNOSIS — M4646 Discitis, unspecified, lumbar region: Secondary | ICD-10-CM | POA: Diagnosis not present

## 2022-01-24 DIAGNOSIS — I509 Heart failure, unspecified: Secondary | ICD-10-CM | POA: Diagnosis not present

## 2022-01-24 DIAGNOSIS — M4626 Osteomyelitis of vertebra, lumbar region: Secondary | ICD-10-CM | POA: Diagnosis not present

## 2022-01-28 DIAGNOSIS — J449 Chronic obstructive pulmonary disease, unspecified: Secondary | ICD-10-CM | POA: Diagnosis not present

## 2022-01-28 DIAGNOSIS — G4733 Obstructive sleep apnea (adult) (pediatric): Secondary | ICD-10-CM | POA: Diagnosis not present

## 2022-01-29 DIAGNOSIS — M6281 Muscle weakness (generalized): Secondary | ICD-10-CM | POA: Diagnosis not present

## 2022-01-29 DIAGNOSIS — M170 Bilateral primary osteoarthritis of knee: Secondary | ICD-10-CM | POA: Diagnosis not present

## 2022-01-29 DIAGNOSIS — R262 Difficulty in walking, not elsewhere classified: Secondary | ICD-10-CM | POA: Diagnosis not present

## 2022-01-31 ENCOUNTER — Encounter (HOSPITAL_COMMUNITY): Payer: Self-pay | Admitting: Hematology

## 2022-02-01 DIAGNOSIS — M170 Bilateral primary osteoarthritis of knee: Secondary | ICD-10-CM | POA: Diagnosis not present

## 2022-02-06 NOTE — Progress Notes (Unsigned)
[place] [place], [place] [address]   CLINIC:  Medical Oncology/Hematology  PCP:  Wilburt Finlay, MD [address] [place] [address] [phone]   REASON FOR VISIT: Follow-up for pancytopenia   CURRENT THERAPY: Close surveillance, intermittent IV iron (last Feraheme 04/05/2020 and 04/21/2020)  INTERVAL HISTORY:   [name] 57 y.o. male returns for routine follow-up of leukopenia/thrombocytopenia (secondary to cirrhosis and splenomegaly) and iron deficiency anemia (secondary to malabsorption in the setting of alcohol abuse and cirrhosis, possible slow chronic blood loss from varices and portal hypertensive gastropathy).  His last IV iron infusion was Feraheme on 04/05/2020 and 04/21/2020.  He has not required PRBC blood transfusions.  He was last evaluated via telemedicine visit by Tarri Abernethy PA-C on 10/05/2021.  At today's visit, he reports feeling ***.  He denies any changes in his baseline health status or new diagnoses since his last visit.  *** Taking B12 500 mcg 3x weekly Mr. Boltz reports easy bruising, but denies any petechial rash.*** ***He denies any recent signs of melena, hematochezia, hematuria, epistaxis, hemoptysis, or hematemesis.  He has not had any infections since his spinal osteomyelitis in October 2022.   *** Energy *** He denies any B symptoms such as fever, chills, night sweats, unintentional weight loss.  He denies any recent chest pain on exertion, shortness of breath on minimal exertion, presyncopal episodes, or palpitations.  No pica.   He has ***% energy and ***% appetite. He endorses that he is maintaining a stable weight.   ASSESSMENT & PLAN:  1.  Pancytopenia, secondary to cirrhosis of the liver and splenomegaly - Secondary to cirrhosis and splenomegaly. - Status post bone marrow aspiration and biopsy on 10/24/2015 that demonstrated normocellular bone marrow with trilineage hematopoiesis and normal  cytogenetics - Abdominal ultrasound performed on 03/02/2020 showed liver cirrhosis and marked splenomegaly, with spleen measuring 20.6 cm in length with a calculated volume 2334 mL - Nutritional panel from 06/21/2020 showed normal B12, normal folate; SPEP normal - Patient had platelet transfusion on 05/27/2020 prior to his EGD/colonoscopy - Reports easy bruising but denies petechial rash.  No major bleeding episodes - No B symptoms or frequent infections.*** - Most recent labs (01/09/2022): WBC 3.0/lymphocytes 0.5, platelets 62, Hgb 14.5.  CMP at baseline.  Normal LDH.  Immature platelet fraction 4.6% (normal). - PLAN: Blood counts at baseline.  We will continue to monitor closely with repeat labs and RTC in 4 months with office visit 1 week after labs. - If he has continued worsening of his thrombocytopenia (platelets persistently 30-40), we will refer to interventional radiology for splenic artery embolization (per curbside discussion with gastroenterologist, no contraindication for IR embolization from GI standpoint)   2. Portal vein thrombus - Abdominal ultrasound performed 03/02/2020 continues to show chronic portal vein thrombus described as a stable nonocclusive intraluminal thrombus within the main portal vein - History of GI bleeding while on warfarin for portal vein thrombus - Not a candidate for anticoagulation due to thrombocytopenia, history of GI bleeding, and ongoing bleeding risk   3.  Iron deficiency anemia - Likely secondary to malabsorption of nutrients in the setting of alcohol use and cirrhosis, may also have some slow chronic blood loss  - Unable to tolerate oral iron - Has required intermittent IV infusions, with last IV Venofer given in March/April 2022 - Recent EGD (05/27/2020): Grade II esophageal varices without stigmata of recent bleeding, banded/eradicated during procedure; gastric antral vascular ectasia without bleeding; portal hypertensive gastropathy -  Recent colonoscopy  (05/27/2020): two nonbleeding colonic angiodysplastic lesions; nonbleeding internal hemorrhoids; diverticulosis in sigmoid and descending colon - He is taking iron tablet daily.*** - He reports dark bowel movements for the past month.  He has not followed up with GI in over a year, despite multiple attempts by GI office to schedule him.*** - Most recent labs (01/09/2022): Hgb 14.5/MCV 88.4.  Ferritin 74, iron saturation 44%. - PLAN: No indication for IV iron at this time.  We will repeat labs and RTC in 4 months. - Continue daily iron tablet.   4.  Vitamin B12 deficiency - Labs from 10/09/2021 show marginal B12 288 with normal MMA. - Patient instructed to start taking OTC vitamin B12 500 mcg 3 times weekly.  *** - Most recent labs (01/09/2022): Normal B12 550, normal MMA.  Normal folate. - PLAN: Continue vitamin B12 500 mcg 3 times weekly.  5. Liver cirrhosis - Follows with GI for hepatologic management - Receiving abdominal ultrasounds via GI every 6 months to screen for [facility], given his high risk status of developing hepatocellular carcinoma - PLAN: Patient has not seen GI in over a year.  He is strongly encouraged to follow-up with GI for ongoing management of his liver cirrhosis.  Multiple attempts have been made by GI office as well as our office to schedule patient for GI follow-up, but patient has been noncompliant.    PLAN SUMMARY: >> Labs in 4 months (CBC/D, CMP, LDH, B12, MMA, ferritin, iron/TIBC) >> OFFICE visit 1 week after labs >> ***   [place] [facility] **   You were seen today by Tarri Abernethy PA-C for your ***.    *** ***  *** ***  LABS: Return in ***   OTHER TESTS: ***  MEDICATIONS: ***  FOLLOW-UP APPOINTMENT: ***     REVIEW OF SYSTEMS: ***  Review of Systems - Oncology   PHYSICAL EXAM:  ECOG PERFORMANCE STATUS: {CHL ONC ECOG XT:[id number]} *** There were no vitals filed for this  visit. There were no vitals filed for this visit. Physical Exam  PAST MEDICAL/SURGICAL HISTORY:  Past Medical History:  Diagnosis Date   Anxiety    Atrial fibrillation (HCC)    Blood clot in vein    blood clot in portal vein   CHF (congestive heart failure) (HCC)    Cirrhosis (HCC)    NASH   Constipation    COPD (chronic obstructive pulmonary disease) (HCC)    GERD (gastroesophageal reflux disease)    Gout    Leukopenia 07/08/2015   Neuromuscular disorder (HCC)    neuropathy in hands and feet   Psoriasis    RA (rheumatoid arthritis) ([place])    Sleep apnea    cpap used- level 10 and greater   Thrombocytopenia ([place])    Past Surgical History:  Procedure Laterality Date   ANKLE SURGERY     right ankle talor repair   CHOLECYSTECTOMY  sept 2016   CHOLECYSTECTOMY     COLONOSCOPY  05/08/2011   Procedure: COLONOSCOPY;  Surgeon: Rogene Houston, MD;  [facility];  Service: Endoscopy;  Laterality: N/A;  730   COLONOSCOPY WITH PROPOFOL N/A 05/27/2020   Procedure: COLONOSCOPY WITH PROPOFOL;  Surgeon: Harvel Quale, MD;  [facility];  Service: Gastroenterology;  Laterality: N/A;  Patient needs a unit of platelets prior to procedure.   ESOPHAGEAL BANDING N/A 05/27/2020   Procedure: ESOPHAGEAL BANDING;  Surgeon: Harvel Quale,  MD;  [facility];  Service: Gastroenterology;  Laterality: N/A;   ESOPHAGOGASTRODUODENOSCOPY  02/28/2011   Procedure: ESOPHAGOGASTRODUODENOSCOPY (EGD);  Surgeon: Rogene Houston, MD;  [facility];  Service: Endoscopy;  Laterality: N/A;  1200   ESOPHAGOGASTRODUODENOSCOPY N/A 06/11/2012   Procedure: ESOPHAGOGASTRODUODENOSCOPY (EGD);  Surgeon: Rogene Houston, MD;  [facility];  Service: Endoscopy;  Laterality: N/A;  1200  FYI patient is 400 pounds   ESOPHAGOGASTRODUODENOSCOPY (EGD) WITH PROPOFOL N/A 04/21/2014   Procedure: ESOPHAGOGASTRODUODENOSCOPY (EGD) WITH PROPOFOL;  Surgeon: Rogene Houston, MD;  Location: [facility];  Service: Endoscopy;  Laterality: N/A;   ESOPHAGOGASTRODUODENOSCOPY (EGD) WITH PROPOFOL N/A 05/27/2020   Procedure: ESOPHAGOGASTRODUODENOSCOPY (EGD) WITH PROPOFOL;  Surgeon: Harvel Quale, MD;  [facility];  Service: Gastroenterology;  Laterality: N/A;  7:30 am   ESOPHAGOGASTRODUODENOSCOPY (EGD) WITH PROPOFOL N/A 07/29/2020   Procedure: ESOPHAGOGASTRODUODENOSCOPY (EGD) WITH PROPOFOL;  Surgeon: Harvel Quale, MD;  [facility];  Service: Gastroenterology;  Laterality: N/A;  8:20   HERNIA REPAIR Right    as child; inguinal   HERNIA REPAIR  sept 2016   LIVER BIOPSY  2012   SCIATIC NERVE EXPLORATION     TONSILLECTOMY      SOCIAL HISTORY:  Social History   Socioeconomic History   Marital status: Divorced    Spouse name: Not on file   Number of children: 1   Years of education: Not on file   Highest education level: Some college, no degree  Occupational History   Occupation: Disabled  Tobacco Use   Smoking status: Never   Smokeless tobacco: Current    Types: Snuff, Chew   Tobacco comments:    Pt reports that he "dips"  Vaping Use   Vaping Use: Never used  Substance and Sexual Activity   Alcohol use: Yes    Alcohol/week: 4.0 standard drinks of alcohol    Types: 4 Shots of liquor per week    Comment: last use 07/29/2020 2pm, one beer 24 oz   Drug use: No    Comment: Use to smoke cocaine and marijuana. No IV drug use   Sexual activity: Yes  Other Topics Concern   Not on file  Social History Narrative   Not on file   Social Determinants of Health   Financial Resource Strain: Medium Risk (12/30/2019)   Overall Financial Resource Strain (CARDIA)    Difficulty of Paying Living Expenses: Somewhat hard  Food Insecurity: No Food Insecurity (12/30/2019)   Hunger Vital Sign    Worried About Running Out of Food in the Last Year: Never true    Ran Out of Food in the Last Year: Never true  Transportation  Needs: No Transportation Needs (12/30/2019)   PRAPARE - Hydrologist (Medical): No    Lack of Transportation (Non-Medical): No  Physical Activity: Inactive (12/30/2019)   Exercise Vital Sign    Days of Exercise per Week: 0 days    Minutes of Exercise per Session: 0 min  Stress: Not on file  Social Connections: Not on file  Intimate Partner Violence: Not on file    FAMILY HISTORY:  Family History  Problem Relation Age of Onset   Colon cancer Mother    Cancer Mother        intestine   Cancer Father        throat   Cancer Brother        brain   Cancer Sister  Heart attack Neg Hx    Stroke Neg Hx     CURRENT MEDICATIONS:  Outpatient Encounter Medications as of 02/07/2022  Medication Sig Note   Cyanocobalamin (B-12) 500 MCG TABS Take 1 tablet by mouth 3 (three) times a week.    albuterol (VENTOLIN HFA) 108 (90 Base) MCG/ACT inhaler Inhale 2 puffs into the lungs every 6 (six) hours as needed for wheezing or shortness of breath.    aspirin EC 325 MG tablet Take 162.5 mg by mouth every 6 (six) hours as needed for moderate pain.    baclofen (LIORESAL) 10 MG tablet TAKE 1 TABLET BY MOUTH THREE TIMES A DAY (Patient taking differently: Take 10 mg by mouth 3 (three) times daily.)    betamethasone dipropionate 0.05 % cream APPLY TO AFFECTED AREA TWICE A DAY (Patient taking differently: Apply 1 application  topically daily as needed (itching).)    buprenorphine-naloxone (SUBOXONE) 8-2 mg SUBL SL tablet Place 0.5 tablets under the tongue 2 (two) times daily as needed (pain).    busPIRone (BUSPAR) 10 MG tablet TAKE 1 TABLET BY MOUTH THREE TIMES A DAY (Patient taking differently: Take 10 mg by mouth 3 (three) times daily.)    colchicine 0.6 MG tablet TAKE 1 TABLET BY MOUTH EVERY DAY    [employer] FOLIC ACID 700 MCG tablet TAKE 1 TABLET BY MOUTH EVERY DAY    diltiazem (CARDIZEM CD) 300 MG 24 hr capsule TAKE ONE CAPSULE BY MOUTH EVERY DAY    diltiazem (CARDIZEM SR) 90 MG 12  hr capsule Take 1 capsule (90 mg total) by mouth daily as needed. palpitations    doxepin (SINEQUAN) 10 MG capsule Take 10 mg by mouth at bedtime.    DULoxetine (CYMBALTA) 60 MG capsule Take 60 mg by mouth daily.    esomeprazole (NEXIUM) 40 MG capsule TAKE 1 CAPSULE BY MOUTH EVERY DAY    ezetimibe (ZETIA) 10 MG tablet Take 1 tablet (10 mg total) by mouth daily.    FEROSUL 325 (65 Fe) MG tablet Take 325 mg by mouth every morning.    fluticasone (FLONASE) 50 MCG/ACT nasal spray USE 2 SPRAYS IN EACH NOSTRIL EVERY DAY AS NEEDED (Patient taking differently: Place 2 sprays into both nostrils daily as needed for allergies.)    gabapentin (NEURONTIN) 800 MG tablet Take 1 tablet (800 mg total) by mouth 3 (three) times daily.    HYDROmorphone (DILAUDID) 2 MG tablet Take 2 mg by mouth 3 (three) times daily as needed for moderate pain.    indomethacin (INDOCIN) 50 MG capsule Take 50 mg by mouth 3 (three) times daily as needed for moderate pain.    ipratropium (ATROVENT) 0.02 % nebulizer solution Take 2.5 mLs (0.5 mg total) by nebulization every 6 (six) hours as needed for wheezing or shortness of breath.    lactulose (CHRONULAC) 10 GM/15ML solution Take 30 mLs (20 g total) by mouth 2 (two) times daily. titate to 2-3 bowel movmeents per day    lidocaine (XYLOCAINE) 5 % ointment Apply 1 application topically as needed.    magnesium oxide (MAG-OX) 400 MG tablet TAKE 400 MG BY MOUTH DAILY. (Patient taking differently: Take 400 mg by mouth daily.) 07/22/2020: Needs refill   meloxicam (MOBIC) 15 MG tablet Take 15 mg by mouth daily as needed.    Menthol-Methyl Salicylate (MUSCLE RUB) 10-15 % CREA Apply 1 application topically 3 (three) times daily as needed for muscle pain. For muscle pain    metolazone (ZAROXOLYN) 2.5 MG tablet Take 1 tablet (2.5 mg  total) by mouth 3 (three) times a week.    metoprolol tartrate (LOPRESSOR) 25 MG tablet Take 1 tablet (25 mg total) by mouth 2 (two) times daily.    Milk Thistle 1000 MG  CAPS Take 1,000 mg by mouth in the morning, at noon, and at bedtime.    Multiple Vitamins-Minerals (MULTI COMPLETE PO) Take 1 tablet by mouth daily.     OneTouch Delica Lancets 00B MISC Test BS QID Dx E11.9    pravastatin (PRAVACHOL) 10 MG tablet TAKE 1 TABLET BY MOUTH DAILY    QUEtiapine (SEROQUEL) 100 MG tablet TAKE 1 TABLET BY MOUTH EVERYDAY AT BEDTIME    sildenafil (REVATIO) 20 MG tablet Take 2-5 tablets by mouth 30-60 minutes before intercourse    spironolactone (ALDACTONE) 50 MG tablet Take 1 tablet (50 mg total) by mouth daily.    torsemide (DEMADEX) 20 MG tablet Take 4 tablets (80 mg total) by mouth 2 (two) times daily.    Facility-Administered Encounter Medications as of 02/07/2022  Medication   0.9 %  sodium chloride infusion   ferumoxytol (FERAHEME) 510 mg in sodium chloride 0.9 % 100 mL IVPB    ALLERGIES:  Allergies  Allergen Reactions   Allopurinol Other (See Comments)    Joint pain worse     LABORATORY DATA:  I have reviewed the labs as listed.  CBC    Component Value Date/Time   WBC 3.0 (L) 01/09/2022 0955   RBC 5.00 01/09/2022 0955   HGB 14.5 01/09/2022 0955   HGB 11.4 (L) 02/22/2020 1138   HCT 44.2 01/09/2022 0955   HCT 33.9 (L) 02/22/2020 1138   PLT 62 (L) 01/09/2022 0955   PLT 38 (LL) 02/22/2020 1138   MCV 88.4 01/09/2022 0955   MCV 79 02/22/2020 1138   MCH 29.0 01/09/2022 0955   MCHC 32.8 01/09/2022 0955   RDW 13.9 01/09/2022 0955   RDW 14.0 02/22/2020 1138   LYMPHSABS 0.5 (L) 01/09/2022 0955   LYMPHSABS 0.4 (L) 02/22/2020 1138   MONOABS 0.3 01/09/2022 0955   EOSABS 0.2 01/09/2022 0955   EOSABS 0.1 02/22/2020 1138   BASOSABS 0.1 01/09/2022 0955   BASOSABS 0.0 02/22/2020 1138      Latest Ref Rng & Units 01/09/2022    9:55 AM 09/29/2021    8:53 AM 05/22/2021   12:23 PM  CMP  Glucose 70 - 99 mg/dL 110  109  131   BUN 6 - 20 mg/dL '14  13  20   '$ Creatinine 0.61 - 1.24 mg/dL 0.78  0.88  0.82   Sodium 135 - 145 mmol/L 137  137  138   Potassium 3.5 -  5.1 mmol/L 4.1  4.5  3.8   Chloride 98 - 111 mmol/L 100  106  108   CO2 22 - 32 mmol/L '28  23  22   '$ Calcium 8.9 - 10.3 mg/dL 9.3  8.9  9.3   Total Protein 6.5 - 8.1 g/dL 7.7  6.6  7.6   Total Bilirubin 0.3 - 1.2 mg/dL 1.5  1.1  1.5   Alkaline Phos 38 - 126 U/L 118  83  82   AST 15 - 41 U/L 53  28  26   ALT 0 - 44 U/L '29  17  15     '$ DIAGNOSTIC IMAGING:  I have independently reviewed the relevant imaging and discussed with the patient.   WRAP UP:  All questions were answered. The patient knows to call the clinic with any problems, questions or  concerns.  Medical decision making: ***  Time spent on visit: I spent *** minutes counseling the patient face to face. The total time spent in the appointment was *** minutes and more than 50% was on counseling.  Harriett Rush, PA-C  ***

## 2022-02-07 ENCOUNTER — Inpatient Hospital Stay: Payer: 59 | Admitting: Physician Assistant

## 2022-02-07 DIAGNOSIS — M170 Bilateral primary osteoarthritis of knee: Secondary | ICD-10-CM | POA: Diagnosis not present

## 2022-02-07 DIAGNOSIS — M4646 Discitis, unspecified, lumbar region: Secondary | ICD-10-CM | POA: Diagnosis not present

## 2022-02-07 DIAGNOSIS — G894 Chronic pain syndrome: Secondary | ICD-10-CM | POA: Diagnosis not present

## 2022-02-07 DIAGNOSIS — Z5181 Encounter for therapeutic drug level monitoring: Secondary | ICD-10-CM | POA: Diagnosis not present

## 2022-02-07 DIAGNOSIS — M1711 Unilateral primary osteoarthritis, right knee: Secondary | ICD-10-CM | POA: Diagnosis not present

## 2022-02-07 DIAGNOSIS — M1712 Unilateral primary osteoarthritis, left knee: Secondary | ICD-10-CM | POA: Diagnosis not present

## 2022-02-07 NOTE — Progress Notes (Unsigned)
[name], [place] [address]   CLINIC:  Medical Oncology/Hematology  PCP:  Wilburt Finlay, MD [address] [place] [address] [phone]   REASON FOR VISIT: Follow-up for pancytopenia   CURRENT THERAPY: Close surveillance, intermittent IV iron (last Feraheme 04/05/2020 and 04/21/2020)  INTERVAL HISTORY:   [name] 57 y.o. male returns for routine follow-up of leukopenia/thrombocytopenia (secondary to cirrhosis and splenomegaly) and iron deficiency anemia (secondary to malabsorption in the setting of alcohol abuse and cirrhosis, possible slow chronic blood loss from varices and portal hypertensive gastropathy).  His last IV iron infusion was Feraheme on 04/05/2020 and 04/21/2020.  He has not required PRBC blood transfusions.  He was last evaluated via telemedicine visit by Tarri Abernethy PA-C on 10/05/2021.  At today's visit, he reports feeling ***.  He denies any changes in his baseline health status or new diagnoses since his last visit.  *** Taking B12 500 mcg 3x weekly [name] reports easy bruising, but denies any petechial rash.*** ***He denies any recent signs of melena, hematochezia, hematuria, epistaxis, hemoptysis, or hematemesis.  He has not had any infections since his spinal osteomyelitis in October 2022.   *** Energy *** He denies any B symptoms such as fever, chills, night sweats, unintentional weight loss.  He denies any recent chest pain on exertion, shortness of breath on minimal exertion, presyncopal episodes, or palpitations.  No pica.   He has ***% energy and ***% appetite. He endorses that he is maintaining a stable weight.  ASSESSMENT & PLAN:  1.  Pancytopenia, secondary to cirrhosis of the liver and splenomegaly - Status post bone marrow aspiration and biopsy on 10/24/2015 that demonstrated normocellular bone marrow with trilineage hematopoiesis and normal cytogenetics - Abdominal ultrasound performed on  03/02/2020 showed liver cirrhosis and marked splenomegaly, with spleen measuring 20.6 cm in length with a calculated volume 2334 mL - Nutritional panel from 06/21/2020 showed normal B12, normal folate; SPEP normal - Patient had platelet transfusion on 05/27/2020 prior to his EGD/colonoscopy - Reports easy bruising but denies petechial rash.  No major bleeding episodes - No B symptoms or frequent infections.*** - Most recent labs (01/09/2022): WBC 3.0/lymphocytes 0.5, platelets 62, Hgb 14.5.  CMP at baseline.  Normal LDH.  Immature platelet fraction 4.6% (normal). - PLAN: Blood counts at baseline.  We will continue to monitor closely with repeat labs and RTC in 4 months with office visit 1 week after labs. - If he has continued worsening of his thrombocytopenia (platelets persistently 30-40), we will refer to interventional radiology for splenic artery embolization (per curbside discussion with gastroenterologist, no contraindication for IR embolization from GI standpoint)   2. Portal vein thrombus - Abdominal ultrasound performed 03/02/2020 continues to show chronic portal vein thrombus described as a stable nonocclusive intraluminal thrombus within the main portal vein - History of GI bleeding while on warfarin for portal vein thrombus - Not a candidate for anticoagulation due to thrombocytopenia, history of GI bleeding, and ongoing bleeding risk   3.  Iron deficiency anemia - Likely secondary to malabsorption of nutrients in the setting of alcohol use and cirrhosis, may also have some slow chronic blood loss  - Unable to tolerate oral iron - Has required intermittent IV infusions, with last IV Venofer given in March/April 2022 - Recent EGD (05/27/2020): Grade II esophageal varices without stigmata of recent bleeding, banded/eradicated during procedure; gastric antral vascular ectasia without bleeding; portal hypertensive gastropathy - Recent colonoscopy (05/27/2020): two nonbleeding colonic  angiodysplastic lesions; nonbleeding internal hemorrhoids; diverticulosis in sigmoid and descending colon - He is taking iron tablet daily.*** - He reports dark bowel movements for the past month.  He has not followed up with GI in over a year, despite multiple attempts by GI office to schedule him.*** - Most recent labs (01/09/2022): Hgb 14.5/MCV 88.4.  Ferritin 74, iron saturation 44%. - PLAN: No indication for IV iron at this time.  We will repeat labs and RTC in 4 months. - Continue daily iron tablet.   4.  Vitamin B12 deficiency - Labs from 10/09/2021 show marginal B12 288 with normal MMA. - Patient instructed to start taking OTC vitamin B12 500 mcg 3 times weekly.  *** - Most recent labs (01/09/2022): Normal B12 550, normal MMA.  Normal folate. - PLAN: Continue vitamin B12 500 mcg 3 times weekly.  5. Liver cirrhosis - Follows with GI for hepatologic management - Receiving abdominal ultrasounds via GI every 6 months to screen for [facility], given his high risk status of developing hepatocellular carcinoma - PLAN: Patient has not seen GI in over a year.  He is strongly encouraged to follow-up with GI for ongoing management of his liver cirrhosis.  Multiple attempts have been made by GI office as well as our office to schedule patient for GI follow-up, but patient has been noncompliant.    PLAN SUMMARY: >> Labs in 4 months (CBC/D, CMP, LDH, B12, MMA, ferritin, iron/TIBC) >> OFFICE visit 1 week after labs >> ***   [facility] [place] **   You were seen today by Tarri Abernethy PA-C for your ***.    *** ***  *** ***  LABS: Return in ***   OTHER TESTS: ***  MEDICATIONS: ***  FOLLOW-UP APPOINTMENT: ***     REVIEW OF SYSTEMS: ***  Review of Systems - Oncology   PHYSICAL EXAM:  ECOG PERFORMANCE STATUS: {CHL ONC ECOG [id number]} *** There were no vitals filed for this visit. There were no vitals filed for  this visit. Physical Exam  PAST MEDICAL/SURGICAL HISTORY:  Past Medical History:  Diagnosis Date   Anxiety    Atrial fibrillation (HCC)    Blood clot in vein    blood clot in portal vein   CHF (congestive heart failure) (HCC)    Cirrhosis (HCC)    NASH   Constipation    COPD (chronic obstructive pulmonary disease) (HCC)    GERD (gastroesophageal reflux disease)    Gout    Leukopenia 07/08/2015   Neuromuscular disorder (HCC)    neuropathy in hands and feet   Psoriasis    RA (rheumatoid arthritis) ([place])    Sleep apnea    cpap used- level 10 and greater   Thrombocytopenia ([place])    Past Surgical History:  Procedure Laterality Date   ANKLE SURGERY     right ankle talor repair   CHOLECYSTECTOMY  sept 2016   CHOLECYSTECTOMY     COLONOSCOPY  05/08/2011   Procedure: COLONOSCOPY;  Surgeon: Rogene Houston, MD;  [facility];  Service: Endoscopy;  Laterality: N/A;  730   COLONOSCOPY WITH PROPOFOL N/A 05/27/2020   Procedure: COLONOSCOPY WITH PROPOFOL;  Surgeon: Harvel Quale, MD;  [facility];  Service: Gastroenterology;  Laterality: N/A;  Patient needs a unit of platelets prior to procedure.   ESOPHAGEAL BANDING N/A 05/27/2020   Procedure: ESOPHAGEAL BANDING;  Surgeon: Montez Morita, Quillian Quince, MD;  [facility];  Service: Gastroenterology;  Laterality: N/A;   ESOPHAGOGASTRODUODENOSCOPY  02/28/2011   Procedure: ESOPHAGOGASTRODUODENOSCOPY (EGD);  Surgeon: Rogene Houston, MD;  [facility];  Service: Endoscopy;  Laterality: N/A;  1200   ESOPHAGOGASTRODUODENOSCOPY N/A 06/11/2012   Procedure: ESOPHAGOGASTRODUODENOSCOPY (EGD);  Surgeon: Rogene Houston, MD;  [facility];  Service: Endoscopy;  Laterality: N/A;  1200  FYI patient is 400 pounds   ESOPHAGOGASTRODUODENOSCOPY (EGD) WITH PROPOFOL N/A 04/21/2014   Procedure: ESOPHAGOGASTRODUODENOSCOPY (EGD) WITH PROPOFOL;  Surgeon: Rogene Houston, MD;  Location: [facility];  Service:  Endoscopy;  Laterality: N/A;   ESOPHAGOGASTRODUODENOSCOPY (EGD) WITH PROPOFOL N/A 05/27/2020   Procedure: ESOPHAGOGASTRODUODENOSCOPY (EGD) WITH PROPOFOL;  Surgeon: Harvel Quale, MD;  [facility];  Service: Gastroenterology;  Laterality: N/A;  7:30 am   ESOPHAGOGASTRODUODENOSCOPY (EGD) WITH PROPOFOL N/A 07/29/2020   Procedure: ESOPHAGOGASTRODUODENOSCOPY (EGD) WITH PROPOFOL;  Surgeon: Harvel Quale, MD;  [facility];  Service: Gastroenterology;  Laterality: N/A;  8:20   HERNIA REPAIR Right    as child; inguinal   HERNIA REPAIR  sept 2016   LIVER BIOPSY  2012   SCIATIC NERVE EXPLORATION     TONSILLECTOMY      SOCIAL HISTORY:  Social History   Socioeconomic History   Marital status: Divorced    Spouse name: Not on file   Number of children: 1   Years of education: Not on file   Highest education level: Some college, no degree  Occupational History   Occupation: Disabled  Tobacco Use   Smoking status: Never   Smokeless tobacco: Current    Types: Snuff, Chew   Tobacco comments:    Pt reports that he "dips"  Vaping Use   Vaping Use: Never used  Substance and Sexual Activity   Alcohol use: Yes    Alcohol/week: 4.0 standard drinks of alcohol    Types: 4 Shots of liquor per week    Comment: last use 07/29/2020 2pm, one beer 24 oz   Drug use: No    Comment: Use to smoke cocaine and marijuana. No IV drug use   Sexual activity: Yes  Other Topics Concern   Not on file  Social History Narrative   Not on file   Social Determinants of Health   Financial Resource Strain: Medium Risk (12/30/2019)   Overall Financial Resource Strain (CARDIA)    Difficulty of Paying Living Expenses: Somewhat hard  Food Insecurity: No Food Insecurity (12/30/2019)   Hunger Vital Sign    Worried About Running Out of Food in the Last Year: Never true    Ran Out of Food in the Last Year: Never true  Transportation Needs: No Transportation Needs (12/30/2019)    PRAPARE - Hydrologist (Medical): No    Lack of Transportation (Non-Medical): No  Physical Activity: Inactive (12/30/2019)   Exercise Vital Sign    Days of Exercise per Week: 0 days    Minutes of Exercise per Session: 0 min  Stress: Not on file  Social Connections: Not on file  Intimate Partner Violence: Not on file    FAMILY HISTORY:  Family History  Problem Relation Age of Onset   Colon cancer Mother    Cancer Mother        intestine   Cancer Father        throat   Cancer Brother        brain   Cancer Sister    Heart attack Neg Hx  Stroke Neg Hx     CURRENT MEDICATIONS:  Outpatient Encounter Medications as of 02/08/2022  Medication Sig Note   Cyanocobalamin (B-12) 500 MCG TABS Take 1 tablet by mouth 3 (three) times a week.    albuterol (VENTOLIN HFA) 108 (90 Base) MCG/ACT inhaler Inhale 2 puffs into the lungs every 6 (six) hours as needed for wheezing or shortness of breath.    aspirin EC 325 MG tablet Take 162.5 mg by mouth every 6 (six) hours as needed for moderate pain.    baclofen (LIORESAL) 10 MG tablet TAKE 1 TABLET BY MOUTH THREE TIMES A DAY (Patient taking differently: Take 10 mg by mouth 3 (three) times daily.)    betamethasone dipropionate 0.05 % cream APPLY TO AFFECTED AREA TWICE A DAY (Patient taking differently: Apply 1 application  topically daily as needed (itching).)    buprenorphine-naloxone (SUBOXONE) 8-2 mg SUBL SL tablet Place 0.5 tablets under the tongue 2 (two) times daily as needed (pain).    busPIRone (BUSPAR) 10 MG tablet TAKE 1 TABLET BY MOUTH THREE TIMES A DAY (Patient taking differently: Take 10 mg by mouth 3 (three) times daily.)    colchicine 0.6 MG tablet TAKE 1 TABLET BY MOUTH EVERY DAY    [employer] FOLIC ACID 662 MCG tablet TAKE 1 TABLET BY MOUTH EVERY DAY    diltiazem (CARDIZEM CD) 300 MG 24 hr capsule TAKE ONE CAPSULE BY MOUTH EVERY DAY    diltiazem (CARDIZEM SR) 90 MG 12 hr capsule Take 1 capsule (90 mg total) by  mouth daily as needed. palpitations    doxepin (SINEQUAN) 10 MG capsule Take 10 mg by mouth at bedtime.    DULoxetine (CYMBALTA) 60 MG capsule Take 60 mg by mouth daily.    esomeprazole (NEXIUM) 40 MG capsule TAKE 1 CAPSULE BY MOUTH EVERY DAY    ezetimibe (ZETIA) 10 MG tablet Take 1 tablet (10 mg total) by mouth daily.    FEROSUL 325 (65 Fe) MG tablet Take 325 mg by mouth every morning.    fluticasone (FLONASE) 50 MCG/ACT nasal spray USE 2 SPRAYS IN EACH NOSTRIL EVERY DAY AS NEEDED (Patient taking differently: Place 2 sprays into both nostrils daily as needed for allergies.)    gabapentin (NEURONTIN) 800 MG tablet Take 1 tablet (800 mg total) by mouth 3 (three) times daily.    HYDROmorphone (DILAUDID) 2 MG tablet Take 2 mg by mouth 3 (three) times daily as needed for moderate pain.    indomethacin (INDOCIN) 50 MG capsule Take 50 mg by mouth 3 (three) times daily as needed for moderate pain.    ipratropium (ATROVENT) 0.02 % nebulizer solution Take 2.5 mLs (0.5 mg total) by nebulization every 6 (six) hours as needed for wheezing or shortness of breath.    lactulose (CHRONULAC) 10 GM/15ML solution Take 30 mLs (20 g total) by mouth 2 (two) times daily. titate to 2-3 bowel movmeents per day    lidocaine (XYLOCAINE) 5 % ointment Apply 1 application topically as needed.    magnesium oxide (MAG-OX) 400 MG tablet TAKE 400 MG BY MOUTH DAILY. (Patient taking differently: Take 400 mg by mouth daily.) 07/22/2020: Needs refill   meloxicam (MOBIC) 15 MG tablet Take 15 mg by mouth daily as needed.    Menthol-Methyl Salicylate (MUSCLE RUB) 10-15 % CREA Apply 1 application topically 3 (three) times daily as needed for muscle pain. For muscle pain    metolazone (ZAROXOLYN) 2.5 MG tablet Take 1 tablet (2.5 mg total) by mouth 3 (three) times a  week.    metoprolol tartrate (LOPRESSOR) 25 MG tablet Take 1 tablet (25 mg total) by mouth 2 (two) times daily.    Milk Thistle 1000 MG CAPS Take 1,000 mg by mouth in the morning,  at noon, and at bedtime.    Multiple Vitamins-Minerals (MULTI COMPLETE PO) Take 1 tablet by mouth daily.     OneTouch Delica Lancets 40J MISC Test BS QID Dx E11.9    pravastatin (PRAVACHOL) 10 MG tablet TAKE 1 TABLET BY MOUTH DAILY    QUEtiapine (SEROQUEL) 100 MG tablet TAKE 1 TABLET BY MOUTH EVERYDAY AT BEDTIME    sildenafil (REVATIO) 20 MG tablet Take 2-5 tablets by mouth 30-60 minutes before intercourse    spironolactone (ALDACTONE) 50 MG tablet Take 1 tablet (50 mg total) by mouth daily.    torsemide (DEMADEX) 20 MG tablet Take 4 tablets (80 mg total) by mouth 2 (two) times daily.    Facility-Administered Encounter Medications as of 02/08/2022  Medication   0.9 %  sodium chloride infusion   ferumoxytol (FERAHEME) 510 mg in sodium chloride 0.9 % 100 mL IVPB    ALLERGIES:  Allergies  Allergen Reactions   Allopurinol Other (See Comments)    Joint pain worse     LABORATORY DATA:  I have reviewed the labs as listed.  CBC    Component Value Date/Time   WBC 3.0 (L) 01/09/2022 0955   RBC 5.00 01/09/2022 0955   HGB 14.5 01/09/2022 0955   HGB 11.4 (L) 02/22/2020 1138   HCT 44.2 01/09/2022 0955   HCT 33.9 (L) 02/22/2020 1138   PLT 62 (L) 01/09/2022 0955   PLT 38 (LL) 02/22/2020 1138   MCV 88.4 01/09/2022 0955   MCV 79 02/22/2020 1138   MCH 29.0 01/09/2022 0955   MCHC 32.8 01/09/2022 0955   RDW 13.9 01/09/2022 0955   RDW 14.0 02/22/2020 1138   LYMPHSABS 0.5 (L) 01/09/2022 0955   LYMPHSABS 0.4 (L) 02/22/2020 1138   MONOABS 0.3 01/09/2022 0955   EOSABS 0.2 01/09/2022 0955   EOSABS 0.1 02/22/2020 1138   BASOSABS 0.1 01/09/2022 0955   BASOSABS 0.0 02/22/2020 1138      Latest Ref Rng & Units 01/09/2022    9:55 AM 09/29/2021    8:53 AM 05/22/2021   12:23 PM  CMP  Glucose 70 - 99 mg/dL 110  109  131   BUN 6 - 20 mg/dL '14  13  20   '$ Creatinine 0.61 - 1.24 mg/dL 0.78  0.88  0.82   Sodium 135 - 145 mmol/L 137  137  138   Potassium 3.5 - 5.1 mmol/L 4.1  4.5  3.8   Chloride 98 - 111  mmol/L 100  106  108   CO2 22 - 32 mmol/L '28  23  22   '$ Calcium 8.9 - 10.3 mg/dL 9.3  8.9  9.3   Total Protein 6.5 - 8.1 g/dL 7.7  6.6  7.6   Total Bilirubin 0.3 - 1.2 mg/dL 1.5  1.1  1.5   Alkaline Phos 38 - 126 U/L 118  83  82   AST 15 - 41 U/L 53  28  26   ALT 0 - 44 U/L '29  17  15     '$ DIAGNOSTIC IMAGING:  I have independently reviewed the relevant imaging and discussed with the patient.   WRAP UP:  All questions were answered. The patient knows to call the clinic with any problems, questions or concerns.  Medical decision making: ***  Time spent on visit: I spent *** minutes counseling the patient face to face. The total time spent in the appointment was *** minutes and more than 50% was on counseling.  Harriett Rush, PA-C  ***

## 2022-02-08 ENCOUNTER — Encounter (HOSPITAL_COMMUNITY): Payer: Self-pay | Admitting: Hematology

## 2022-02-08 ENCOUNTER — Inpatient Hospital Stay: Payer: 59 | Admitting: Physician Assistant

## 2022-02-12 NOTE — Progress Notes (Unsigned)
[name], [name] [id number]   CLINIC:  Medical Oncology/Hematology  PCP:  [name] Finlay, MD [address] [place] [address] [phone]   REASON FOR VISIT: Follow-up for pancytopenia   CURRENT THERAPY: Close surveillance, intermittent IV iron (last Feraheme 04/05/2020 and 04/21/2020)  INTERVAL HISTORY:   [name] 57 y.o. male returns for routine follow-up of leukopenia/thrombocytopenia (secondary to cirrhosis and splenomegaly) and iron deficiency anemia (secondary to malabsorption in the setting of alcohol abuse and cirrhosis, possible slow chronic blood loss from varices and portal hypertensive gastropathy).  His last IV iron infusion was Feraheme on 04/05/2020 and 04/21/2020.  He has not required PRBC blood transfusions.  He was last evaluated via telemedicine visit by [name] Abernethy PA-C on 10/05/2021.  At today's visit, he reports feeling fair wonderful thank you.  He denies any changes in his baseline health status or new diagnoses since his last visit.  He continues to take daily iron supplements and vitamin B12 500 mcg 3 times weekly.  He reports moderate fatigue with energy that "comes and goes."  He denies any easy bruising or petechial rash.  He denies any recent signs of melena, hematochezia, hematuria, epistaxis, hemoptysis, or hematemesis.  He has not had any infections since his spinal osteomyelitis in October 2022.  He denies any B symptoms such as fever, chills, night sweats, unintentional weight loss.  He has stable shortness of breath on exertion but denies any recent chest pain, presyncopal episodes, or palpitations.  No pica.   He has 25% energy and 100% appetite. He endorses that he is maintaining a stable weight.   ASSESSMENT & PLAN:  1.  Pancytopenia, secondary to cirrhosis of the liver and splenomegaly - Secondary to cirrhosis and splenomegaly. - Status post bone marrow aspiration and biopsy on 10/24/2015 that  demonstrated normocellular bone marrow with trilineage hematopoiesis and normal cytogenetics - Abdominal ultrasound performed on 03/02/2020 showed liver cirrhosis and marked splenomegaly, with spleen measuring 20.6 cm in length with a calculated volume 2334 mL - Nutritional panel from 06/21/2020 showed normal B12, normal folate; SPEP normal - Patient had platelet transfusion on 05/27/2020 prior to his EGD/colonoscopy - Reports easy bruising but denies petechial rash.  No major bleeding episodes - No B symptoms or frequent infections. - Most recent labs (01/09/2022): WBC 3.0/lymphocytes 0.5, platelets 62, Hgb 14.5.  CMP at baseline.  Normal LDH.  Immature platelet fraction 4.6% (normal). - PLAN: Blood counts at baseline.  We will continue to monitor closely with repeat labs and RTC in 4 months with phone visit 1 week after labs. - If he has continued worsening of his thrombocytopenia (platelets persistently 30-40), we will refer to interventional radiology for splenic artery embolization (per curbside discussion with gastroenterologist, no contraindication for IR embolization from GI standpoint)   2. Portal vein thrombus - Abdominal ultrasound performed 03/02/2020 continues to show chronic portal vein thrombus described as a stable nonocclusive intraluminal thrombus within the main portal vein - History of GI bleeding while on warfarin for portal vein thrombus - Not a candidate for anticoagulation due to thrombocytopenia, history of GI bleeding, and ongoing bleeding risk   3.  Iron deficiency anemia - Likely secondary to malabsorption of nutrients in the setting of alcohol use and cirrhosis, may also have some slow chronic blood loss  - Unable to tolerate oral iron - Has required intermittent IV infusions, with last IV Venofer given in March/April 2022 - Recent EGD (05/27/2020): Grade II esophageal  varices without stigmata of recent bleeding, banded/eradicated during procedure; gastric antral vascular  ectasia without bleeding; portal hypertensive gastropathy - Recent colonoscopy (05/27/2020): two nonbleeding colonic angiodysplastic lesions; nonbleeding internal hemorrhoids; diverticulosis in sigmoid and descending colon - He is taking iron tablet daily. - Most recent labs (01/09/2022): Hgb 14.5/MCV 88.4.  Ferritin 74, iron saturation 44%. - PLAN: Ferritin is improving on oral iron supplementation.  We will hold off on IV iron supplementation for the time being. - We will repeat labs and RTC in 4 months. - Continue daily iron tablet.   4.  Vitamin B12 deficiency - Labs from 10/09/2021 show marginal B12 288 with normal MMA. - Patient instructed to start taking OTC vitamin B12 500 mcg 3 times weekly.   - Most recent labs (01/09/2022): Normal B12 550, normal MMA.  Normal folate. - PLAN: Continue vitamin B12 500 mcg 3 times weekly.  5. Liver cirrhosis - Follows with GI for hepatologic management - Receiving abdominal ultrasounds via GI every 6 months to screen for [facility], given his high risk status of developing hepatocellular carcinoma - PLAN: Patient has not seen GI in over a year.  He is strongly encouraged to follow-up with GI for ongoing management of his liver cirrhosis.  Multiple attempts have been made by GI office as well as our office to schedule patient for GI follow-up, but patient has been noncompliant.   PLAN SUMMARY: >> Labs in 4 months (CBC/D, CMP, LDH, B12, MMA, ferritin, iron/TIBC) >> PHONE visit 1 week after labs [facility] office visit next due February 2025    REVIEW OF SYSTEMS:   Review of Systems  Constitutional:  Positive for fatigue. Negative for appetite change, chills, diaphoresis, fever and unexpected weight change.  HENT:   Negative for lump/mass and nosebleeds.   Eyes:  Negative for eye problems.  Respiratory:  Positive for shortness of breath (with exertion). Negative for cough and hemoptysis.   Cardiovascular:  Negative for chest pain, leg swelling and palpitations.   Gastrointestinal:  Negative for abdominal pain, blood in stool, constipation, diarrhea, nausea and vomiting.  Genitourinary:  Negative for hematuria.   Musculoskeletal:  Positive for arthralgias.  Skin: Negative.   Neurological:  Negative for dizziness, headaches and light-headedness.  Hematological:  Does not bruise/bleed easily.  Psychiatric/Behavioral:  Positive for sleep disturbance.      PHYSICAL EXAM:  ECOG PERFORMANCE STATUS: 3 - Symptomatic, >50% confined to bed  There were no vitals filed for this visit. There were no vitals filed for this visit. Physical Exam Constitutional:      Appearance: Normal appearance. He is morbidly obese.  HENT:     Head: Normocephalic and atraumatic.     Mouth/Throat:     Mouth: Mucous membranes are moist.  Eyes:     Extraocular Movements: Extraocular movements intact.     Pupils: Pupils are equal, round, and reactive to light.  Cardiovascular:     Rate and Rhythm: Normal rate and regular rhythm.     Pulses: Normal pulses.     Heart sounds: Normal heart sounds.  Pulmonary:     Effort: Pulmonary effort is normal.     Breath sounds: Normal breath sounds.  Abdominal:     General: Bowel sounds are normal.     Palpations: Abdomen is soft.     Tenderness: There is no abdominal tenderness.     Comments: Ventral hernia.  Musculoskeletal:        General: No swelling.     Right lower leg: Edema  present.     Left lower leg: Edema present.  Lymphadenopathy:     Cervical: No cervical adenopathy.  Skin:    General: Skin is warm and dry.     Findings: Bruising present.  Neurological:     General: No focal deficit present.     Mental Status: He is alert and oriented to person, place, and time.  Psychiatric:        Mood and Affect: Mood normal.        Behavior: Behavior normal.     PAST MEDICAL/SURGICAL HISTORY:  Past Medical History:  Diagnosis Date   Anxiety    Atrial fibrillation (HCC)    Blood clot in vein    blood clot in portal  vein   CHF (congestive heart failure) (HCC)    Cirrhosis (HCC)    NASH   Constipation    COPD (chronic obstructive pulmonary disease) (HCC)    GERD (gastroesophageal reflux disease)    Gout    Leukopenia 07/08/2015   Neuromuscular disorder (HCC)    neuropathy in hands and feet   Psoriasis    RA (rheumatoid arthritis) ([place])    Sleep apnea    cpap used- level 10 and greater   Thrombocytopenia ([place])    Past Surgical History:  Procedure Laterality Date   ANKLE SURGERY     right ankle talor repair   CHOLECYSTECTOMY  sept 2016   CHOLECYSTECTOMY     COLONOSCOPY  05/08/2011   Procedure: COLONOSCOPY;  Surgeon: Rogene Houston, MD;  [facility];  Service: Endoscopy;  Laterality: N/A;  730   COLONOSCOPY WITH PROPOFOL N/A 05/27/2020   Procedure: COLONOSCOPY WITH PROPOFOL;  Surgeon: Harvel Quale, MD;  [facility];  Service: Gastroenterology;  Laterality: N/A;  Patient needs a unit of platelets prior to procedure.   ESOPHAGEAL BANDING N/A 05/27/2020   Procedure: ESOPHAGEAL BANDING;  Surgeon: Harvel Quale, MD;  [facility];  Service: Gastroenterology;  Laterality: N/A;   ESOPHAGOGASTRODUODENOSCOPY  02/28/2011   Procedure: ESOPHAGOGASTRODUODENOSCOPY (EGD);  Surgeon: Rogene Houston, MD;  [facility];  Service: Endoscopy;  Laterality: N/A;  1200   ESOPHAGOGASTRODUODENOSCOPY N/A 06/11/2012   Procedure: ESOPHAGOGASTRODUODENOSCOPY (EGD);  Surgeon: Rogene Houston, MD;  [facility];  Service: Endoscopy;  Laterality: N/A;  1200  FYI patient is 400 pounds   ESOPHAGOGASTRODUODENOSCOPY (EGD) WITH PROPOFOL N/A 04/21/2014   Procedure: ESOPHAGOGASTRODUODENOSCOPY (EGD) WITH PROPOFOL;  Surgeon: Rogene Houston, MD;  Location: [facility];  Service: Endoscopy;  Laterality: N/A;   ESOPHAGOGASTRODUODENOSCOPY (EGD) WITH PROPOFOL N/A 05/27/2020   Procedure: ESOPHAGOGASTRODUODENOSCOPY (EGD) WITH PROPOFOL;  Surgeon: Harvel Quale,  MD;  [facility];  Service: Gastroenterology;  Laterality: N/A;  7:30 am   ESOPHAGOGASTRODUODENOSCOPY (EGD) WITH PROPOFOL N/A 07/29/2020   Procedure: ESOPHAGOGASTRODUODENOSCOPY (EGD) WITH PROPOFOL;  Surgeon: Harvel Quale, MD;  [facility];  Service: Gastroenterology;  Laterality: N/A;  8:20   HERNIA REPAIR Right    as child; inguinal   HERNIA REPAIR  sept 2016   LIVER BIOPSY  2012   SCIATIC NERVE EXPLORATION     TONSILLECTOMY      SOCIAL HISTORY:  Social History   Socioeconomic History   Marital status: Divorced    Spouse name: Not on file   Number of children: 1   Years of education: Not on file   Highest education level: Some college, no degree  Occupational History   Occupation: Disabled  Tobacco  Use   Smoking status: Never   Smokeless tobacco: Current    Types: Snuff, Chew   Tobacco comments:    Pt reports that he "dips"  Vaping Use   Vaping Use: Never used  Substance and Sexual Activity   Alcohol use: Yes    Alcohol/week: 4.0 standard drinks of alcohol    Types: 4 Shots of liquor per week    Comment: last use 07/29/2020 2pm, one beer 24 oz   Drug use: No    Comment: Use to smoke cocaine and marijuana. No IV drug use   Sexual activity: Yes  Other Topics Concern   Not on file  Social History Narrative   Not on file   Social Determinants of Health   Financial Resource Strain: Medium Risk (12/30/2019)   Overall Financial Resource Strain (CARDIA)    Difficulty of Paying Living Expenses: Somewhat hard  Food Insecurity: No Food Insecurity (12/30/2019)   Hunger Vital Sign    Worried About Running Out of Food in the Last Year: Never true    Ran Out of Food in the Last Year: Never true  Transportation Needs: No Transportation Needs (12/30/2019)   PRAPARE - Hydrologist (Medical): No    Lack of Transportation (Non-Medical): No  Physical Activity: Inactive (12/30/2019)   Exercise Vital Sign    Days of  Exercise per Week: 0 days    Minutes of Exercise per Session: 0 min  Stress: Not on file  Social Connections: Not on file  Intimate Partner Violence: Not on file    FAMILY HISTORY:  Family History  Problem Relation Age of Onset   Colon cancer Mother    Cancer Mother        intestine   Cancer Father        throat   Cancer Brother        brain   Cancer Sister    Heart attack Neg Hx    Stroke Neg Hx     CURRENT MEDICATIONS:  Outpatient Encounter Medications as of 02/13/2022  Medication Sig Note   Cyanocobalamin (B-12) 500 MCG TABS Take 1 tablet by mouth 3 (three) times a week.    albuterol (VENTOLIN HFA) 108 (90 Base) MCG/ACT inhaler Inhale 2 puffs into the lungs every 6 (six) hours as needed for wheezing or shortness of breath.    aspirin EC 325 MG tablet Take 162.5 mg by mouth every 6 (six) hours as needed for moderate pain.    baclofen (LIORESAL) 10 MG tablet TAKE 1 TABLET BY MOUTH THREE TIMES A DAY (Patient taking differently: Take 10 mg by mouth 3 (three) times daily.)    betamethasone dipropionate 0.05 % cream APPLY TO AFFECTED AREA TWICE A DAY (Patient taking differently: Apply 1 application  topically daily as needed (itching).)    buprenorphine-naloxone (SUBOXONE) 8-2 mg SUBL SL tablet Place 0.5 tablets under the tongue 2 (two) times daily as needed (pain).    busPIRone (BUSPAR) 10 MG tablet TAKE 1 TABLET BY MOUTH THREE TIMES A DAY (Patient taking differently: Take 10 mg by mouth 3 (three) times daily.)    colchicine 0.6 MG tablet TAKE 1 TABLET BY MOUTH EVERY DAY    [employer] FOLIC ACID 644 MCG tablet TAKE 1 TABLET BY MOUTH EVERY DAY    diltiazem (CARDIZEM CD) 300 MG 24 hr capsule TAKE ONE CAPSULE BY MOUTH EVERY DAY    diltiazem (CARDIZEM SR) 90 MG 12 hr capsule Take 1 capsule (90 mg total)  by mouth daily as needed. palpitations    doxepin (SINEQUAN) 10 MG capsule Take 10 mg by mouth at bedtime.    DULoxetine (CYMBALTA) 60 MG capsule Take 60 mg by mouth daily.    esomeprazole  (NEXIUM) 40 MG capsule TAKE 1 CAPSULE BY MOUTH EVERY DAY    ezetimibe (ZETIA) 10 MG tablet Take 1 tablet (10 mg total) by mouth daily.    FEROSUL 325 (65 Fe) MG tablet Take 325 mg by mouth every morning.    fluticasone (FLONASE) 50 MCG/ACT nasal spray USE 2 SPRAYS IN EACH NOSTRIL EVERY DAY AS NEEDED (Patient taking differently: Place 2 sprays into both nostrils daily as needed for allergies.)    gabapentin (NEURONTIN) 800 MG tablet Take 1 tablet (800 mg total) by mouth 3 (three) times daily.    HYDROmorphone (DILAUDID) 2 MG tablet Take 2 mg by mouth 3 (three) times daily as needed for moderate pain.    indomethacin (INDOCIN) 50 MG capsule Take 50 mg by mouth 3 (three) times daily as needed for moderate pain.    ipratropium (ATROVENT) 0.02 % nebulizer solution Take 2.5 mLs (0.5 mg total) by nebulization every 6 (six) hours as needed for wheezing or shortness of breath.    lactulose (CHRONULAC) 10 GM/15ML solution Take 30 mLs (20 g total) by mouth 2 (two) times daily. titate to 2-3 bowel movmeents per day    lidocaine (XYLOCAINE) 5 % ointment Apply 1 application topically as needed.    magnesium oxide (MAG-OX) 400 MG tablet TAKE 400 MG BY MOUTH DAILY. (Patient taking differently: Take 400 mg by mouth daily.) 07/22/2020: Needs refill   meloxicam (MOBIC) 15 MG tablet Take 15 mg by mouth daily as needed.    Menthol-Methyl Salicylate (MUSCLE RUB) 10-15 % CREA Apply 1 application topically 3 (three) times daily as needed for muscle pain. For muscle pain    metolazone (ZAROXOLYN) 2.5 MG tablet Take 1 tablet (2.5 mg total) by mouth 3 (three) times a week.    metoprolol tartrate (LOPRESSOR) 25 MG tablet Take 1 tablet (25 mg total) by mouth 2 (two) times daily.    Milk Thistle 1000 MG CAPS Take 1,000 mg by mouth in the morning, at noon, and at bedtime.    Multiple Vitamins-Minerals (MULTI COMPLETE PO) Take 1 tablet by mouth daily.     OneTouch Delica Lancets 42V MISC Test BS QID Dx E11.9    pravastatin  (PRAVACHOL) 10 MG tablet TAKE 1 TABLET BY MOUTH DAILY    QUEtiapine (SEROQUEL) 100 MG tablet TAKE 1 TABLET BY MOUTH EVERYDAY AT BEDTIME    sildenafil (REVATIO) 20 MG tablet Take 2-5 tablets by mouth 30-60 minutes before intercourse    spironolactone (ALDACTONE) 50 MG tablet Take 1 tablet (50 mg total) by mouth daily.    torsemide (DEMADEX) 20 MG tablet Take 4 tablets (80 mg total) by mouth 2 (two) times daily.    Facility-Administered Encounter Medications as of 02/13/2022  Medication   0.9 %  sodium chloride infusion   ferumoxytol (FERAHEME) 510 mg in sodium chloride 0.9 % 100 mL IVPB    ALLERGIES:  Allergies  Allergen Reactions   Allopurinol Other (See Comments)    Joint pain worse     LABORATORY DATA:  I have reviewed the labs as listed.  CBC    Component Value Date/Time   WBC 3.0 (L) 01/09/2022 0955   RBC 5.00 01/09/2022 0955   HGB 14.5 01/09/2022 0955   HGB 11.4 (L) 02/22/2020 1138   HCT  44.2 01/09/2022 0955   HCT 33.9 (L) 02/22/2020 1138   PLT 62 (L) 01/09/2022 0955   PLT 38 (LL) 02/22/2020 1138   MCV 88.4 01/09/2022 0955   MCV 79 02/22/2020 1138   MCH 29.0 01/09/2022 0955   MCHC 32.8 01/09/2022 0955   RDW 13.9 01/09/2022 0955   RDW 14.0 02/22/2020 1138   LYMPHSABS 0.5 (L) 01/09/2022 0955   LYMPHSABS 0.4 (L) 02/22/2020 1138   MONOABS 0.3 01/09/2022 0955   EOSABS 0.2 01/09/2022 0955   EOSABS 0.1 02/22/2020 1138   BASOSABS 0.1 01/09/2022 0955   BASOSABS 0.0 02/22/2020 1138      Latest Ref Rng & Units 01/09/2022    9:55 AM 09/29/2021    8:53 AM 05/22/2021   12:23 PM  CMP  Glucose 70 - 99 mg/dL 110  109  131   BUN 6 - 20 mg/dL '14  13  20   '$ Creatinine 0.61 - 1.24 mg/dL 0.78  0.88  0.82   Sodium 135 - 145 mmol/L 137  137  138   Potassium 3.5 - 5.1 mmol/L 4.1  4.5  3.8   Chloride 98 - 111 mmol/L 100  106  108   CO2 22 - 32 mmol/L '28  23  22   '$ Calcium 8.9 - 10.3 mg/dL 9.3  8.9  9.3   Total Protein 6.5 - 8.1 g/dL 7.7  6.6  7.6   Total Bilirubin 0.3 - 1.2 mg/dL 1.5   1.1  1.5   Alkaline Phos 38 - 126 U/L 118  83  82   AST 15 - 41 U/L 53  28  26   ALT 0 - 44 U/L '29  17  15     '$ DIAGNOSTIC IMAGING:  I have independently reviewed the relevant imaging and discussed with the patient.   WRAP UP:  All questions were answered. The patient knows to call the clinic with any problems, questions or concerns.  Medical decision making: Moderate  Time spent on visit: I spent 20 minutes counseling the patient face to face. The total time spent in the appointment was 30 minutes and more than 50% was on counseling.  Harriett Rush, PA-C  02/13/22 9:08 AM

## 2022-02-13 ENCOUNTER — Inpatient Hospital Stay: Payer: 59 | Attending: Physician Assistant | Admitting: Physician Assistant

## 2022-02-13 ENCOUNTER — Encounter: Payer: Self-pay | Admitting: Pulmonary Disease

## 2022-02-13 ENCOUNTER — Ambulatory Visit (INDEPENDENT_AMBULATORY_CARE_PROVIDER_SITE_OTHER): Payer: 59 | Admitting: Pulmonary Disease

## 2022-02-13 VITALS — BP 123/86 | HR 83 | Temp 97.8°F | Resp 20 | Ht 71.0 in | Wt 342.4 lb

## 2022-02-13 VITALS — BP 130/88 | HR 74 | Ht 71.0 in

## 2022-02-13 DIAGNOSIS — J4520 Mild intermittent asthma, uncomplicated: Secondary | ICD-10-CM | POA: Diagnosis not present

## 2022-02-13 DIAGNOSIS — E538 Deficiency of other specified B group vitamins: Secondary | ICD-10-CM | POA: Insufficient documentation

## 2022-02-13 DIAGNOSIS — Z79899 Other long term (current) drug therapy: Secondary | ICD-10-CM | POA: Diagnosis not present

## 2022-02-13 DIAGNOSIS — D696 Thrombocytopenia, unspecified: Secondary | ICD-10-CM

## 2022-02-13 DIAGNOSIS — D61818 Other pancytopenia: Secondary | ICD-10-CM

## 2022-02-13 DIAGNOSIS — D509 Iron deficiency anemia, unspecified: Secondary | ICD-10-CM

## 2022-02-13 DIAGNOSIS — I81 Portal vein thrombosis: Secondary | ICD-10-CM | POA: Diagnosis not present

## 2022-02-13 DIAGNOSIS — D708 Other neutropenia: Secondary | ICD-10-CM

## 2022-02-13 DIAGNOSIS — K7460 Unspecified cirrhosis of liver: Secondary | ICD-10-CM | POA: Diagnosis not present

## 2022-02-13 DIAGNOSIS — G4733 Obstructive sleep apnea (adult) (pediatric): Secondary | ICD-10-CM

## 2022-02-13 MED ORDER — B-12 500 MCG PO TABS: 1.0000 | ORAL_TABLET | ORAL | 0 refills | Status: DC

## 2022-02-13 NOTE — Addendum Note (Signed)
Addended by: Fritzi Mandes D on: 02/13/2022 10:01 AM   Modules accepted: Orders

## 2022-02-13 NOTE — Progress Notes (Signed)
   Subjective:    Patient ID: [name], male    DOB: 12-Jan-1965, 57 y.o.   MRN: [id number]  HPI  50  morbidly obese man for FU of OSA &  asthma. baseline NPSG showed severe OSA and he has been maintained on auto CPAP , DME is [employer] -new machine in 2022    PMH -EtOH cirrhosis of liver HF PEF Atrial fibrillation Chronic pain -prolonged hospitalization in 2022 for bacteremia and osteomyelitis severe knee osteoarthritis   Chief Complaint  Patient presents with   Follow-up    Patient still having a hard time with sleep despite using CPAP. He states that he feels like he is not getting enough air at night time    6 month FU visit  He continues to struggle with severe osteoarthritis of both knees, arrives in his wheelchair today accompanied by his service dog Bolt. He is undergoing physical therapy and able to take a few steps without crutches.  He is established with a new family care doctor at [facility]. We reviewed sleep study, he was concerned about needing oxygen hence we did the sleep study, no oxygen was required, CPAP 11 cm was required. We set him up with auto CPAP 10 to 16 cm and this is working well.  He uses a nasal mask and is very compliant.  No problems with mask, still feels like he needs more pressure but likes auto settings  Significant tests/ events reviewed  01/2007 PSG showed AHI 78/h - wt was 385 lbs  10/2021 CPAP 11 cm, no O2 required   08/2009 spirometry >> no airway obstruction or restriction   PFTs 2011 do not show obstruction, FEV1 81%, TLC 76%, DLCO 70% corrects for alveolar volume    Review of Systems neg for any significant sore throat, dysphagia, itching, sneezing, nasal congestion or excess/ purulent secretions, fever, chills, sweats, unintended wt loss, pleuritic or exertional cp, hempoptysis, orthopnea pnd or change in chronic leg swelling. Also denies presyncope, palpitations, heartburn, abdominal pain, nausea, vomiting, diarrhea or change in  bowel or urinary habits, dysuria,hematuria, rash, arthralgias, visual complaints, headache, numbness weakness or ataxia.     Objective:   Physical Exam   Gen. Pleasant, obese, in no distress ENT - no lesions, no post nasal drip Neck: No JVD, no thyromegaly, no carotid bruits Lungs: no use of accessory muscles, no dullness to percussion, decreased without rales or rhonchi  Cardiovascular: Rhythm regular, heart sounds  normal, no murmurs or gallops, no peripheral edema Musculoskeletal: No deformities, no cyanosis or clubbing , no tremors        Assessment & Plan:

## 2022-02-13 NOTE — Patient Instructions (Signed)
[place] at [facility] Discharge Instructions  You were seen today by Tarri Abernethy PA-C for your low blood cells, which are related to your underlying liver cirrhosis.    You do not need any specific treatment at this time, but if your platelets and white blood cells get much lower, we will send you for a procedure to have your spleen shrunk.  Continue to take B12 pills three times weekly and iron tablets daily.  We will see you for repeat labs and follow-up visit (via telephone) in 4 months.  It is EXTREMELY IMPORTANT that you also continue to see your GI doctor for management of your liver cirrhosis.    Thank you for choosing [place] at [facility] to provide your oncology and hematology care.  To afford each patient quality time with our provider, please arrive at least 15 minutes before your scheduled appointment time.   If you have a lab appointment with the [place] please come in thru the Main Entrance and check in at the main information desk.  You need to re-schedule your appointment should you arrive 10 or more minutes late.  We strive to give you quality time with our providers, and arriving late affects you and other patients whose appointments are after yours.  Also, if you no show three or more times for appointments you may be dismissed from the clinic at the providers discretion.     Again, thank you for choosing [facility].  Our hope is that these requests will decrease the amount of time that you wait before being seen by our physicians.       _____________________________________________________________  Should you have questions after your visit to [facility], please contact our office at [phone] and follow the prompts.  Our office hours are 8:00 a.m. and 4:30 p.m. Monday - Friday.  Please note that voicemails left after 4:00 p.m. may not be returned until the following  business day.  We are closed weekends and major holidays.  You do have access to a nurse 24-7, just call the main number to the clinic [phone] and do not press any options, hold on the line and a nurse will answer the phone.    For prescription refill requests, have your pharmacy contact our office and allow 72 hours.    Due to Covid, you will need to wear a mask upon entering the hospital. If you do not have a mask, a mask will be given to you at the Main Entrance upon arrival. For doctor visits, patients may have 1 support person age 69 or older with them. For treatment visits, patients can not have anyone with them due to social distancing guidelines and our immunocompromised population.

## 2022-02-13 NOTE — Assessment & Plan Note (Signed)
CPAP download was reviewed which showed excellent control of events on auto settings with average pressure of 12.6 and maximum pressure of 13.7 cm.  He is very compliant and CPAP is certainly helped improve his daytime somnolence and fatigue.  He did not require oxygen.  He has settled down with a nasal mask has mild leak. CPAP supplies will be continued for a year  Weight loss encouraged, compliance with goal of at least 4-6 hrs every night is the expectation. Advised against medications with sedative side effects Cautioned against driving when sleepy - understanding that sleepiness will vary on a day to day basis

## 2022-02-13 NOTE — Patient Instructions (Signed)
CPAP supplies will be renewed x 1 year 

## 2022-02-13 NOTE — Assessment & Plan Note (Signed)
Doubt true asthma, mainly related To his weight.  He is off Advair now and only uses albuterol on a as needed basis

## 2022-02-14 ENCOUNTER — Other Ambulatory Visit: Payer: Self-pay

## 2022-02-14 DIAGNOSIS — D509 Iron deficiency anemia, unspecified: Secondary | ICD-10-CM

## 2022-02-14 DIAGNOSIS — D696 Thrombocytopenia, unspecified: Secondary | ICD-10-CM

## 2022-02-14 DIAGNOSIS — D61818 Other pancytopenia: Secondary | ICD-10-CM

## 2022-02-15 ENCOUNTER — Other Ambulatory Visit: Payer: Self-pay | Admitting: *Deleted

## 2022-02-15 ENCOUNTER — Encounter (HOSPITAL_COMMUNITY): Payer: Self-pay | Admitting: Hematology

## 2022-02-20 DIAGNOSIS — R1032 Left lower quadrant pain: Secondary | ICD-10-CM | POA: Diagnosis not present

## 2022-02-20 DIAGNOSIS — K6389 Other specified diseases of intestine: Secondary | ICD-10-CM | POA: Diagnosis not present

## 2022-02-20 DIAGNOSIS — R1031 Right lower quadrant pain: Secondary | ICD-10-CM | POA: Diagnosis not present

## 2022-02-20 DIAGNOSIS — Z8719 Personal history of other diseases of the digestive system: Secondary | ICD-10-CM | POA: Diagnosis not present

## 2022-02-20 DIAGNOSIS — F17220 Nicotine dependence, chewing tobacco, uncomplicated: Secondary | ICD-10-CM | POA: Diagnosis not present

## 2022-02-20 DIAGNOSIS — R1011 Right upper quadrant pain: Secondary | ICD-10-CM | POA: Diagnosis not present

## 2022-02-20 DIAGNOSIS — K7460 Unspecified cirrhosis of liver: Secondary | ICD-10-CM | POA: Diagnosis not present

## 2022-02-20 DIAGNOSIS — R109 Unspecified abdominal pain: Secondary | ICD-10-CM | POA: Diagnosis not present

## 2022-02-20 DIAGNOSIS — Z8679 Personal history of other diseases of the circulatory system: Secondary | ICD-10-CM | POA: Diagnosis not present

## 2022-02-20 DIAGNOSIS — R0602 Shortness of breath: Secondary | ICD-10-CM | POA: Diagnosis not present

## 2022-02-20 DIAGNOSIS — R079 Chest pain, unspecified: Secondary | ICD-10-CM | POA: Diagnosis not present

## 2022-02-20 DIAGNOSIS — I272 Pulmonary hypertension, unspecified: Secondary | ICD-10-CM | POA: Diagnosis not present

## 2022-02-20 DIAGNOSIS — Z7951 Long term (current) use of inhaled steroids: Secondary | ICD-10-CM | POA: Diagnosis not present

## 2022-02-20 DIAGNOSIS — J449 Chronic obstructive pulmonary disease, unspecified: Secondary | ICD-10-CM | POA: Diagnosis not present

## 2022-02-20 DIAGNOSIS — Z888 Allergy status to other drugs, medicaments and biological substances status: Secondary | ICD-10-CM | POA: Diagnosis not present

## 2022-02-20 DIAGNOSIS — K529 Noninfective gastroenteritis and colitis, unspecified: Secondary | ICD-10-CM | POA: Diagnosis not present

## 2022-02-23 DIAGNOSIS — I509 Heart failure, unspecified: Secondary | ICD-10-CM | POA: Diagnosis not present

## 2022-02-23 DIAGNOSIS — Z515 Encounter for palliative care: Secondary | ICD-10-CM | POA: Diagnosis not present

## 2022-02-24 DIAGNOSIS — M4626 Osteomyelitis of vertebra, lumbar region: Secondary | ICD-10-CM | POA: Diagnosis not present

## 2022-02-24 DIAGNOSIS — R5381 Other malaise: Secondary | ICD-10-CM | POA: Diagnosis not present

## 2022-02-24 DIAGNOSIS — I509 Heart failure, unspecified: Secondary | ICD-10-CM | POA: Diagnosis not present

## 2022-02-24 DIAGNOSIS — M4646 Discitis, unspecified, lumbar region: Secondary | ICD-10-CM | POA: Diagnosis not present

## 2022-02-28 DIAGNOSIS — G4733 Obstructive sleep apnea (adult) (pediatric): Secondary | ICD-10-CM | POA: Diagnosis not present

## 2022-02-28 DIAGNOSIS — J449 Chronic obstructive pulmonary disease, unspecified: Secondary | ICD-10-CM | POA: Diagnosis not present

## 2022-02-28 DIAGNOSIS — G43809 Other migraine, not intractable, without status migrainosus: Secondary | ICD-10-CM | POA: Diagnosis not present

## 2022-03-22 ENCOUNTER — Ambulatory Visit (INDEPENDENT_AMBULATORY_CARE_PROVIDER_SITE_OTHER): Payer: 59 | Admitting: Gastroenterology

## 2022-03-25 DIAGNOSIS — R5381 Other malaise: Secondary | ICD-10-CM | POA: Diagnosis not present

## 2022-03-25 DIAGNOSIS — M4626 Osteomyelitis of vertebra, lumbar region: Secondary | ICD-10-CM | POA: Diagnosis not present

## 2022-03-25 DIAGNOSIS — I509 Heart failure, unspecified: Secondary | ICD-10-CM | POA: Diagnosis not present

## 2022-03-25 DIAGNOSIS — M4646 Discitis, unspecified, lumbar region: Secondary | ICD-10-CM | POA: Diagnosis not present

## 2022-03-29 DIAGNOSIS — J449 Chronic obstructive pulmonary disease, unspecified: Secondary | ICD-10-CM | POA: Diagnosis not present

## 2022-03-29 DIAGNOSIS — G4733 Obstructive sleep apnea (adult) (pediatric): Secondary | ICD-10-CM | POA: Diagnosis not present

## 2022-04-18 ENCOUNTER — Other Ambulatory Visit: Payer: Self-pay | Admitting: Pulmonary Disease

## 2022-04-18 ENCOUNTER — Other Ambulatory Visit: Payer: Self-pay | Admitting: Cardiology

## 2022-04-18 DIAGNOSIS — J449 Chronic obstructive pulmonary disease, unspecified: Secondary | ICD-10-CM | POA: Diagnosis not present

## 2022-04-18 DIAGNOSIS — E1165 Type 2 diabetes mellitus with hyperglycemia: Secondary | ICD-10-CM

## 2022-04-18 DIAGNOSIS — Z9189 Other specified personal risk factors, not elsewhere classified: Secondary | ICD-10-CM

## 2022-04-18 DIAGNOSIS — G4733 Obstructive sleep apnea (adult) (pediatric): Secondary | ICD-10-CM | POA: Diagnosis not present

## 2022-04-22 DIAGNOSIS — K4090 Unilateral inguinal hernia, without obstruction or gangrene, not specified as recurrent: Secondary | ICD-10-CM | POA: Diagnosis not present

## 2022-04-22 DIAGNOSIS — F17220 Nicotine dependence, chewing tobacco, uncomplicated: Secondary | ICD-10-CM | POA: Diagnosis not present

## 2022-04-22 DIAGNOSIS — R109 Unspecified abdominal pain: Secondary | ICD-10-CM | POA: Diagnosis not present

## 2022-04-22 DIAGNOSIS — R1033 Periumbilical pain: Secondary | ICD-10-CM | POA: Diagnosis not present

## 2022-04-22 DIAGNOSIS — Z9049 Acquired absence of other specified parts of digestive tract: Secondary | ICD-10-CM | POA: Diagnosis not present

## 2022-04-22 DIAGNOSIS — K429 Umbilical hernia without obstruction or gangrene: Secondary | ICD-10-CM | POA: Diagnosis not present

## 2022-04-22 DIAGNOSIS — J449 Chronic obstructive pulmonary disease, unspecified: Secondary | ICD-10-CM | POA: Diagnosis not present

## 2022-04-22 DIAGNOSIS — D735 Infarction of spleen: Secondary | ICD-10-CM | POA: Diagnosis not present

## 2022-04-22 DIAGNOSIS — K7460 Unspecified cirrhosis of liver: Secondary | ICD-10-CM | POA: Diagnosis not present

## 2022-04-22 DIAGNOSIS — K766 Portal hypertension: Secondary | ICD-10-CM | POA: Diagnosis not present

## 2022-04-22 DIAGNOSIS — I509 Heart failure, unspecified: Secondary | ICD-10-CM | POA: Diagnosis not present

## 2022-04-24 DIAGNOSIS — G894 Chronic pain syndrome: Secondary | ICD-10-CM | POA: Diagnosis not present

## 2022-04-24 DIAGNOSIS — Z79891 Long term (current) use of opiate analgesic: Secondary | ICD-10-CM | POA: Diagnosis not present

## 2022-04-24 DIAGNOSIS — Z79899 Other long term (current) drug therapy: Secondary | ICD-10-CM | POA: Diagnosis not present

## 2022-04-24 DIAGNOSIS — M4626 Osteomyelitis of vertebra, lumbar region: Secondary | ICD-10-CM | POA: Diagnosis not present

## 2022-04-24 DIAGNOSIS — M25579 Pain in unspecified ankle and joints of unspecified foot: Secondary | ICD-10-CM | POA: Diagnosis not present

## 2022-04-24 DIAGNOSIS — M25561 Pain in right knee: Secondary | ICD-10-CM | POA: Diagnosis not present

## 2022-04-27 ENCOUNTER — Telehealth: Payer: Self-pay | Admitting: Cardiology

## 2022-04-27 NOTE — Telephone Encounter (Signed)
Left voicemail to call office.

## 2022-04-27 NOTE — Telephone Encounter (Signed)
Pt is requesting Nitroglycerin be called into [facility] - [place], [place] - [address] for him. He found a bottle of nitro, and called his pharmacy for a refill, but the pharmacy states they have no record of it. It is not on med list either.   If it can be sent in for the pt, the refill info is below:   *STAT* If patient is at the pharmacy, call can be transferred to refill team.   1. Which medications need to be refilled? (please list name of each medication and dose if known)  nitroglycerin  2. Which pharmacy/location (including street and city if local pharmacy) is medication to be sent to? [facility] - [place], [place] - [address]  3. Do they need a 30 day or 90 day supply? 90 day

## 2022-04-27 NOTE — Telephone Encounter (Signed)
Attempt to call patient twice no answer.  OK to send prescription to pharmacy?

## 2022-04-30 NOTE — Telephone Encounter (Signed)
Patient aware follow is needed. Appointment scheduled

## 2022-05-22 DIAGNOSIS — M25562 Pain in left knee: Secondary | ICD-10-CM | POA: Diagnosis not present

## 2022-05-22 DIAGNOSIS — M4626 Osteomyelitis of vertebra, lumbar region: Secondary | ICD-10-CM | POA: Diagnosis not present

## 2022-05-22 DIAGNOSIS — Z79891 Long term (current) use of opiate analgesic: Secondary | ICD-10-CM | POA: Diagnosis not present

## 2022-05-22 DIAGNOSIS — G894 Chronic pain syndrome: Secondary | ICD-10-CM | POA: Diagnosis not present

## 2022-05-22 DIAGNOSIS — M549 Dorsalgia, unspecified: Secondary | ICD-10-CM | POA: Diagnosis not present

## 2022-05-22 DIAGNOSIS — M25579 Pain in unspecified ankle and joints of unspecified foot: Secondary | ICD-10-CM | POA: Diagnosis not present

## 2022-05-22 DIAGNOSIS — M25561 Pain in right knee: Secondary | ICD-10-CM | POA: Diagnosis not present

## 2022-05-29 DIAGNOSIS — Z515 Encounter for palliative care: Secondary | ICD-10-CM | POA: Diagnosis not present

## 2022-05-29 DIAGNOSIS — G4733 Obstructive sleep apnea (adult) (pediatric): Secondary | ICD-10-CM | POA: Diagnosis not present

## 2022-05-29 DIAGNOSIS — I509 Heart failure, unspecified: Secondary | ICD-10-CM | POA: Diagnosis not present

## 2022-05-29 DIAGNOSIS — J449 Chronic obstructive pulmonary disease, unspecified: Secondary | ICD-10-CM | POA: Diagnosis not present

## 2022-06-05 ENCOUNTER — Encounter (INDEPENDENT_AMBULATORY_CARE_PROVIDER_SITE_OTHER): Payer: Self-pay | Admitting: Gastroenterology

## 2022-06-05 ENCOUNTER — Ambulatory Visit (INDEPENDENT_AMBULATORY_CARE_PROVIDER_SITE_OTHER): Payer: 59 | Admitting: Gastroenterology

## 2022-06-05 ENCOUNTER — Telehealth (INDEPENDENT_AMBULATORY_CARE_PROVIDER_SITE_OTHER): Payer: Self-pay | Admitting: Gastroenterology

## 2022-06-05 VITALS — BP 135/92 | HR 74 | Temp 97.8°F | Ht 72.0 in | Wt 375.9 lb

## 2022-06-05 DIAGNOSIS — K7030 Alcoholic cirrhosis of liver without ascites: Secondary | ICD-10-CM

## 2022-06-05 NOTE — Patient Instructions (Signed)
-  We will update labs and US of the liver, these need to be done every 6 months  -We will schedule you for EGD  -You need to abstain from ALL Alcohol use as you have cirrhosis and further alcohol can cause worsening issues with your liver  -continue lactulose -continue aldactone 50mg  daily  - Reduce salt intake to <2 g per day - Can take Tylenol max of 2 g per day (650 mg q8h) for pain - Avoid NSAIDs for pain - Avoid eating raw oysters/shellfish - Ensure protein shake every night before going to sleep  Follow up 6 months

## 2022-06-05 NOTE — Telephone Encounter (Signed)
Pt [place] scheduled for 06/21/22 at 9:30 am. Pt is to arrive at 9:15am; nothing to eat or drink after midnight. Tried to call pt to let him know but voicemail is full and I was unable to leave message
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
## 2022-06-06 ENCOUNTER — Encounter (INDEPENDENT_AMBULATORY_CARE_PROVIDER_SITE_OTHER): Payer: Self-pay

## 2022-06-13 ENCOUNTER — Inpatient Hospital Stay: Payer: 59 | Attending: Physician Assistant | Admitting: Hematology

## 2022-06-13 ENCOUNTER — Other Ambulatory Visit: Payer: Self-pay | Admitting: Urology

## 2022-06-13 DIAGNOSIS — I81 Portal vein thrombosis: Secondary | ICD-10-CM | POA: Insufficient documentation

## 2022-06-13 DIAGNOSIS — D61818 Other pancytopenia: Secondary | ICD-10-CM | POA: Diagnosis not present

## 2022-06-13 DIAGNOSIS — D509 Iron deficiency anemia, unspecified: Secondary | ICD-10-CM

## 2022-06-13 DIAGNOSIS — K7460 Unspecified cirrhosis of liver: Secondary | ICD-10-CM | POA: Diagnosis not present

## 2022-06-13 DIAGNOSIS — R161 Splenomegaly, not elsewhere classified: Secondary | ICD-10-CM | POA: Insufficient documentation

## 2022-06-13 DIAGNOSIS — E222 Syndrome of inappropriate secretion of antidiuretic hormone: Secondary | ICD-10-CM

## 2022-06-13 DIAGNOSIS — N529 Male erectile dysfunction, unspecified: Secondary | ICD-10-CM

## 2022-06-13 DIAGNOSIS — K7030 Alcoholic cirrhosis of liver without ascites: Secondary | ICD-10-CM

## 2022-06-13 DIAGNOSIS — E538 Deficiency of other specified B group vitamins: Secondary | ICD-10-CM | POA: Insufficient documentation

## 2022-06-13 DIAGNOSIS — D696 Thrombocytopenia, unspecified: Secondary | ICD-10-CM

## 2022-06-13 LAB — COMPREHENSIVE METABOLIC PANEL
ALT: 43 U/L (ref 0–44)
AST: 69 U/L — ABNORMAL HIGH (ref 15–41)
Albumin: 3.4 g/dL — ABNORMAL LOW (ref 3.5–5.0)
Alkaline Phosphatase: 104 U/L (ref 38–126)
Anion gap: 9 (ref 5–15)
BUN: 9 mg/dL (ref 6–20)
CO2: 22 mmol/L (ref 22–32)
Calcium: 8.5 mg/dL — ABNORMAL LOW (ref 8.9–10.3)
Chloride: 102 mmol/L (ref 98–111)
Creatinine, Ser: 0.61 mg/dL (ref 0.61–1.24)
GFR, Estimated: >60 mL/min (ref >60)
Glucose, Bld: 137 mg/dL — ABNORMAL HIGH (ref 70–99)
Potassium: 3.8 mmol/L (ref 3.5–5.1)
Sodium: 133 mmol/L — ABNORMAL LOW (ref 135–145)
Total Bilirubin: 1.5 mg/dL — ABNORMAL HIGH (ref 0.3–1.2)
Total Protein: 7 g/dL (ref 6.5–8.1)

## 2022-06-13 LAB — LACTATE DEHYDROGENASE: LDH: 132 U/L (ref 98–192)

## 2022-06-13 LAB — CBC WITH DIFFERENTIAL/PLATELET
Abs Immature Granulocytes: 0.01 10*3/uL (ref 0.00–0.07)
Basophils Absolute: 0 10*3/uL (ref 0.0–0.1)
Basophils Relative: 2 %
Eosinophils Absolute: 0.1 10*3/uL (ref 0.0–0.5)
Eosinophils Relative: 5 %
HCT: 41.6 % (ref 39.0–52.0)
Hemoglobin: 13.9 g/dL (ref 13.0–17.0)
Immature Granulocytes: 1 %
Lymphocytes Relative: 19 %
Lymphs Abs: 0.3 10*3/uL — ABNORMAL LOW (ref 0.7–4.0)
MCH: 29.8 pg (ref 26.0–34.0)
MCHC: 33.4 g/dL (ref 30.0–36.0)
MCV: 89.1 fL (ref 80.0–100.0)
Monocytes Absolute: 0.1 10*3/uL (ref 0.1–1.0)
Monocytes Relative: 8 %
Neutro Abs: 1.1 10*3/uL — ABNORMAL LOW (ref 1.7–7.7)
Neutrophils Relative %: 65 %
Platelets: 29 10*3/uL — CL (ref 150–400)
RBC: 4.67 MIL/uL (ref 4.22–5.81)
RDW: 14 % (ref 11.5–15.5)
WBC: 1.7 10*3/uL — ABNORMAL LOW (ref 4.0–10.5)
nRBC: 0 % (ref 0.0–0.2)

## 2022-06-13 LAB — VITAMIN B12: Vitamin B-12: 347 pg/mL (ref 180–914)

## 2022-06-13 LAB — PROTIME-INR
INR: 1.2 (ref 0.8–1.2)
Prothrombin Time: 15.3 seconds — ABNORMAL HIGH (ref 11.4–15.2)

## 2022-06-13 LAB — IRON AND TIBC
Iron: 40 ug/dL — ABNORMAL LOW (ref 45–182)
Saturation Ratios: 13 % — ABNORMAL LOW (ref 17.9–39.5)
TIBC: 317 ug/dL (ref 250–450)
UIBC: 277 ug/dL

## 2022-06-13 LAB — FERRITIN: Ferritin: 124 ng/mL (ref 24–336)

## 2022-06-13 NOTE — Progress Notes (Signed)
CRITICAL VALUE ALERT Critical value received:  Platelets 29K. Date of notification:  06-13-2022 Time of notification: 14:09 pm. Critical value read back:  Yes.   Nurse who received alert:  B. Quanna Wittke RN. MD notified time and response:  R. Pennington PA @ 14:14pm. No new orders received.

## 2022-06-15 LAB — AFP TUMOR MARKER: AFP, Serum, Tumor Marker: 6.6 ng/mL (ref 0.0–8.4)

## 2022-06-16 LAB — METHYLMALONIC ACID, SERUM: Methylmalonic Acid, Quantitative: 287 nmol/L (ref 0–378)

## 2022-06-18 NOTE — Progress Notes (Unsigned)
VIRTUAL VISIT via TELEPHONE NOTE [facility]   I connected with [name]  on 06/19/22 at  8:36 AM by telephone and verified that I am speaking with the correct person using two identifiers.  Location: Patient: Home Provider: [facility]   I discussed the limitations, risks, security and privacy concerns of performing an evaluation and management service by telephone and the availability of in person appointments. I also discussed with the patient that there may be a patient responsible charge related to this service. The patient expressed understanding and agreed to proceed.  REASON FOR VISIT: Follow-up for pancytopenia   CURRENT THERAPY: Close surveillance, intermittent IV iron (last Feraheme 04/05/2020 and 04/21/2020)  INTERVAL HISTORY:  [name] is contacted today for follow-up of leukopenia/thrombocytopenia (secondary to cirrhosis and splenomegaly) and iron deficiency anemia (secondary to malabsorption in the setting of alcohol abuse and cirrhosis, possible slow chronic blood loss from varices and portal hypertensive gastropathy).  His last IV iron infusion was Feraheme on 04/05/2020 and 04/21/2020.  He has not required PRBC blood transfusions.  He was last seen by Rojelio Brenner PA-C on 02/13/2022.   At today's visit, he reports feeling "okay."  He remains weak and fatigued ever since his prolonged hospital stay last year.  He continues to take daily iron and folate supplements as well as vitamin B12 500 mcg 3 times weekly.   He reports easy bruising but denies any petechial rash.  He apparently cut his arm last week, reports that it "bled right much," but was able to stop bleeding within a reasonable time.  He has had black watery bowel movements consistently for the past several months, but was Hemoccult negative when checked at [facility].  He denies any hematochezia, hematuria, epistaxis, hemoptysis, hematemesis, or gum bleeding.  He  has not had any infections since his spinal osteomyelitis in October 2022.  He denies any B symptoms such as fever, chills, night sweats, unintentional weight loss.  He has stable shortness of breath on exertion but denies any recent chest pain, presyncopal episodes, or palpitations.  No pica.  He has occasional chest discomfort associated with panic attacks.  He has 65% energy and 80% appetite. He endorses that he is maintaining a stable weight.  REVIEW OF SYSTEMS:   Review of Systems  Constitutional:  Positive for malaise/fatigue. Negative for chills, diaphoresis, fever and weight loss.  Respiratory:  Positive for shortness of breath. Negative for cough.   Cardiovascular:  Positive for chest pain (with panic attacks). Negative for palpitations.  Gastrointestinal:  Positive for diarrhea. Negative for abdominal pain, blood in stool, melena, nausea and vomiting.  Neurological:  Positive for tingling. Negative for dizziness and headaches.  Psychiatric/Behavioral:  The patient is nervous/anxious.      PHYSICAL EXAM: (per limitations of virtual telephone visit)  The patient is alert and oriented x 3, exhibiting adequate mentation, good mood, and ability to speak in full sentences and execute sound judgement.  ASSESSMENT & PLAN:  1.  Pancytopenia, secondary to cirrhosis of the liver and splenomegaly - Secondary to cirrhosis and splenomegaly. - Status post bone marrow aspiration and biopsy on 10/24/2015 that demonstrated normocellular bone marrow with trilineage hematopoiesis and normal cytogenetics - Abdominal ultrasound performed on 03/02/2020 showed liver cirrhosis and marked splenomegaly, with spleen measuring 20.6 cm in length with a calculated volume 2334 mL - Nutritional panel from 06/21/2020 showed normal B12, normal folate; SPEP normal - Patient had platelet transfusion on 05/27/2020  prior to his EGD/colonoscopy - Most recent labs (06/13/2022): WBC 1.7/lymphocytes 0.3, platelets 29, Hgb 13.9.   CMP at baseline.  Normal LDH.  Previous immature platelet fraction was 4.6% (normal). - Positive for easy bruising without petechial rash.  No major bleeding events.  Reports adequate hemostasis after cutting his arm last week. - Reports black watery bowel movements consistently for the past several months, but was Hemoccult negative when checked at [facility]. - No B symptoms or frequent infections.   - DISCUSSION: Do not recommend partial splenic embolization (PSE) at this time, based on the following: Although chronic liver disease is associated with thrombocytopenia, severe thrombocytopenia (platelets <30-40 K) is rare. PSE has high risk of complications, and improvement in platelet count does not appear to be longstanding. PSE would only affect one aspect of multifactorial causes of thrombocytopenia in cirrhotic patients, and would not resolve underlying portal hypertension that is felt to be the driving risk factor of bleeding in the majority of cirrhotic patients. Overall, patients with liver cirrhosis have rebalanced hemostasis due to reduced synthesis of procoagulants as well as reduced synthesis of anticoagulants, increased production of VWF, thrombocytopenia, and impaired platelet function.  Although the net effect of these changes is rebalanced hemostasis, hemostatic equilibrium is unstable and can be altered either toward bleeding or thrombosis depending on clinical conditions.  (Patient could be tipped more toward hemorrhage if triggered by progressive portal hypertension, renal failure, or infection.) PSE could be considered in cases of splenic vein thrombosis or if the patient developed bleeding gastric varices. Long-term use of TPO receptor agonists is not currently recommended due to increased risk of portal vein thrombus.  If necessary, could be given preoperatively if platelets <50 K.  - PLAN: Platelets lower than usual baseline, but without any evidence of active bleeding.  He may  also have some increased platelet consumption in the setting of known portal vein thrombus.   - We will continue monitor closely with monthly CBC/D and RTC in 3 months.   2. Portal vein thrombus - Abdominal ultrasound performed 03/02/2020 continues to show chronic portal vein thrombus described as a stable nonocclusive intraluminal thrombus within the main portal vein - History of GI bleeding while on warfarin for portal vein thrombus - Not a candidate for anticoagulation due to thrombocytopenia, history of GI bleeding, and ongoing bleeding risk   3.  Iron deficiency anemia - Likely secondary to malabsorption of nutrients in the setting of alcohol use and cirrhosis, may also have some slow chronic blood loss  - Unable to tolerate oral iron - Has required intermittent IV infusions, with last IV Venofer given in March/April 2022 - Recent EGD (05/27/2020): Grade II esophageal varices without stigmata of recent bleeding, banded/eradicated during procedure; gastric antral vascular ectasia without bleeding; portal hypertensive gastropathy - Recent colonoscopy (05/27/2020): two nonbleeding colonic angiodysplastic lesions; nonbleeding internal hemorrhoids; diverticulosis in sigmoid and descending colon - He is taking iron tablet daily.   - He denies any gross rectal bleeding or melena.  - Reports black watery bowel movements consistently for the past several months, but was Hemoccult negative when checked at [facility]. - Most recent labs (06/13/2022): Hgb 13.9/MCV 89.1.  Ferritin 124, iron saturation 13%. - PLAN: Ferritin is improving on oral iron supplementation.  We will hold off on IV iron supplementation for the time being. - We will repeat labs and RTC in 3 months. - Continue daily iron tablet.   4.  Vitamin B12 and folic acid deficiency - Labs from  10/09/2021 show marginal B12 288 with normal MMA. - He is taking vitamin B12 500 mcg on MWF and folic acid 400 mcg daily - Most recent labs  (06/13/2022): Normal B12 347, normal MMA.  Normal folate. - PLAN: Continue vitamin B12 500 mcg 3 times weekly and folic acid daily.   5. Liver cirrhosis - Follows with GI for hepatologic management - Receiving abdominal ultrasounds via GI every 6 months to screen for [facility], given his high risk status of developing hepatocellular carcinoma - PLAN: Patient has not seen GI in over a year.  He is strongly encouraged to follow-up with GI for ongoing management of his liver cirrhosis.  Multiple attempts have been made by GI office as well as our office to schedule patient for GI follow-up, but patient has been noncompliant.   PLAN SUMMARY: >> Monthly CBC/D >> Labs in 3 months = CMP, CBC/D, ferritin, iron/TIBC, B12, MMA, folate >> PHONE visit in 3 months (1 week after labs)  [employer] office visit next due February 2025     I discussed the assessment and treatment plan with the patient. The patient was provided an opportunity to ask questions and all were answered. The patient agreed with the plan and demonstrated an understanding of the instructions.   The patient was advised to call back or seek an in-person evaluation if the symptoms worsen or if the condition fails to improve as anticipated.  I provided 35 minutes of non-face-to-face time during this encounter.  Carnella Guadalajara, PA-C 06/19/22 9:09 AM

## 2022-06-19 ENCOUNTER — Inpatient Hospital Stay (HOSPITAL_BASED_OUTPATIENT_CLINIC_OR_DEPARTMENT_OTHER): Payer: 59 | Admitting: Physician Assistant

## 2022-06-19 DIAGNOSIS — M4626 Osteomyelitis of vertebra, lumbar region: Secondary | ICD-10-CM | POA: Diagnosis not present

## 2022-06-19 DIAGNOSIS — D61818 Other pancytopenia: Secondary | ICD-10-CM | POA: Diagnosis not present

## 2022-06-19 DIAGNOSIS — E538 Deficiency of other specified B group vitamins: Secondary | ICD-10-CM | POA: Diagnosis not present

## 2022-06-19 DIAGNOSIS — D500 Iron deficiency anemia secondary to blood loss (chronic): Secondary | ICD-10-CM | POA: Diagnosis not present

## 2022-06-19 DIAGNOSIS — D708 Other neutropenia: Secondary | ICD-10-CM

## 2022-06-19 DIAGNOSIS — D696 Thrombocytopenia, unspecified: Secondary | ICD-10-CM | POA: Diagnosis not present

## 2022-06-19 DIAGNOSIS — Z79891 Long term (current) use of opiate analgesic: Secondary | ICD-10-CM | POA: Diagnosis not present

## 2022-06-19 DIAGNOSIS — M25561 Pain in right knee: Secondary | ICD-10-CM | POA: Diagnosis not present

## 2022-06-19 DIAGNOSIS — M25562 Pain in left knee: Secondary | ICD-10-CM | POA: Diagnosis not present

## 2022-06-19 DIAGNOSIS — G894 Chronic pain syndrome: Secondary | ICD-10-CM | POA: Diagnosis not present

## 2022-06-19 DIAGNOSIS — D509 Iron deficiency anemia, unspecified: Secondary | ICD-10-CM | POA: Diagnosis not present

## 2022-06-19 DIAGNOSIS — M25579 Pain in unspecified ankle and joints of unspecified foot: Secondary | ICD-10-CM | POA: Diagnosis not present

## 2022-06-19 DIAGNOSIS — M549 Dorsalgia, unspecified: Secondary | ICD-10-CM | POA: Diagnosis not present

## 2022-06-19 MED ORDER — FOLIC ACID 400 MCG PO TABS
400.0000 ug | ORAL_TABLET | Freq: Every day | ORAL | 3 refills | Status: DC
Start: 2022-06-19 — End: 2022-07-30

## 2022-06-19 MED ORDER — CYANOCOBALAMIN 500 MCG PO TABS
500.0000 ug | ORAL_TABLET | ORAL | 3 refills | Status: DC
Start: 2022-06-20 — End: 2022-09-26

## 2022-06-19 MED ORDER — FERROUS SULFATE 325 (65 FE) MG PO TBEC
325.0000 mg | DELAYED_RELEASE_TABLET | Freq: Every day | ORAL | 3 refills | Status: DC
Start: 2022-06-19 — End: 2023-02-04

## 2022-06-19 NOTE — Addendum Note (Signed)
Addended by: Rojelio Brenner on: 06/19/2022 09:15 AM   Modules accepted: Orders

## 2022-06-21 ENCOUNTER — Ambulatory Visit (HOSPITAL_COMMUNITY): Payer: 59 | Attending: Gastroenterology

## 2022-06-21 DIAGNOSIS — E1159 Type 2 diabetes mellitus with other circulatory complications: Secondary | ICD-10-CM | POA: Diagnosis not present

## 2022-06-21 DIAGNOSIS — R7303 Prediabetes: Secondary | ICD-10-CM | POA: Diagnosis not present

## 2022-06-21 DIAGNOSIS — I152 Hypertension secondary to endocrine disorders: Secondary | ICD-10-CM | POA: Diagnosis not present

## 2022-06-23 NOTE — Progress Notes (Unsigned)
Cardiology Office Note:   Date:  06/23/2022  ID:  [name], DOB 04-26-1965, MRN [id number] PCP: Beatrix Fetters, MD  [facility] Providers Cardiologist:  None { Click to update primary MD,subspecialty MD or APP then REFRESH:1}   History of Present Illness:   [name] is a 57 y.o. male who presents for follow up of diastolic heart failure and a history of atrial fibrillation.  He has diastolic HF.  Since I last saw him he has had a couple of hospitalizations and I was able to review these records in Care Everywhere.  There have been some changes to his medicines and he is not taking Zaroxolyn or potassium or torsemide.  He has been on spironolactone.  He was in the hospital with knee problems and also with osteomyelitis and discitis.  He required aspiration and antibiotics.  He eventually required prolonged rehab.  He has been managed for chronic pain.  Looking through all these records however there was no mention of any cardiac issues.  In particular he was not documented to have any atrial fibrillation.  There did not seem to be any volume issues.  He has to get around now for the most part in a wheelchair because of his knee problems particularly his right and he is not yet a candidate for surgery because of his weight.  He has not had any new shortness of breath, PND or orthopnea.  He does not really notice any palpitations, presyncope or syncope.  He is not having chest pressure, neck or arm discomfort.  He had no weight gain or edema   ROS: ***  Studies Reviewed:    EKG:  ***  ***  Risk Assessment/Calculations:    CHA2DS2-VASc Score =    {Click here to calculate score.  REFRESH note before signing. :1} This indicates a  % annual risk of stroke. The patient's score is based upon: CHF History: 0 HTN History: 1 Age Score: 0 Gender Score: 0     No BP recorded.  {Refresh Note OR Click here to enter BP  :1}***        Physical Exam:   VS:  There were no  vitals taken for this visit.   Wt Readings from Last 3 Encounters:  06/05/22 (!) 375 lb 14.4 oz (170.5 kg)  02/13/22 (!) 342 lb 6 oz (155.3 kg)  07/31/21 (!) 339 lb (153.8 kg)     GEN: Well nourished, well developed in no acute distress NECK: No JVD; No carotid bruits CARDIAC: ***RRR, no murmurs, rubs, gallops RESPIRATORY:  Clear to auscultation without rales, wheezing or rhonchi  ABDOMEN: Soft, non-tender, non-distended EXTREMITIES:  No edema; No deformity   ASSESSMENT AND PLAN:   Chronic diastolic heart failure:    ****  He seems to be euvolemic.  I am going to continue the meds as listed.  I have asked him to get a basic metabolic profile given him written instructions to have this done by his primary provider is last potassium I can see was slightly low.    Hypertension:    The blood pressure is ***  actually running low.  If it continues to run low when he notices this I would probably back off on his Cardizem.    Hyponatremia, hypokalemia, metabolic alkalosis:   *** This will be checked as above.   Chronic alcoholic cirrhosis with pancytopenia:  *** I will defer to his primary provider and explained to him that in part this is  why he was switched to spironolactone.   Obesity class III:   This continues to be a significant issue and he understands the need for diet control.   PAF:    He has PAF.  *** There have been no documented paroxysms recently however.  He is in sinus today.  No anticoagulation.    {Are you ordering a CV Procedure (e.g. stress test, cath, DCCV, TEE, etc)?   Press F2        :[id number]}   Follow up ***  Signed, Rollene Rotunda, MD

## 2022-06-25 DIAGNOSIS — M19071 Primary osteoarthritis, right ankle and foot: Secondary | ICD-10-CM | POA: Diagnosis not present

## 2022-06-25 DIAGNOSIS — G8929 Other chronic pain: Secondary | ICD-10-CM | POA: Diagnosis not present

## 2022-06-25 DIAGNOSIS — M25571 Pain in right ankle and joints of right foot: Secondary | ICD-10-CM | POA: Diagnosis not present

## 2022-06-27 ENCOUNTER — Encounter: Payer: 59 | Admitting: Cardiology

## 2022-06-27 DIAGNOSIS — I10 Essential (primary) hypertension: Secondary | ICD-10-CM

## 2022-06-27 DIAGNOSIS — I480 Paroxysmal atrial fibrillation: Secondary | ICD-10-CM

## 2022-06-27 DIAGNOSIS — I5032 Chronic diastolic (congestive) heart failure: Secondary | ICD-10-CM

## 2022-07-17 DIAGNOSIS — M549 Dorsalgia, unspecified: Secondary | ICD-10-CM | POA: Diagnosis not present

## 2022-07-17 DIAGNOSIS — G894 Chronic pain syndrome: Secondary | ICD-10-CM | POA: Diagnosis not present

## 2022-07-17 DIAGNOSIS — M25562 Pain in left knee: Secondary | ICD-10-CM | POA: Diagnosis not present

## 2022-07-17 DIAGNOSIS — M25561 Pain in right knee: Secondary | ICD-10-CM | POA: Diagnosis not present

## 2022-07-17 DIAGNOSIS — M4626 Osteomyelitis of vertebra, lumbar region: Secondary | ICD-10-CM | POA: Diagnosis not present

## 2022-07-17 DIAGNOSIS — Z79899 Other long term (current) drug therapy: Secondary | ICD-10-CM | POA: Diagnosis not present

## 2022-07-17 DIAGNOSIS — M25579 Pain in unspecified ankle and joints of unspecified foot: Secondary | ICD-10-CM | POA: Diagnosis not present

## 2022-07-17 DIAGNOSIS — Z79891 Long term (current) use of opiate analgesic: Secondary | ICD-10-CM | POA: Diagnosis not present

## 2022-07-18 DIAGNOSIS — I152 Hypertension secondary to endocrine disorders: Secondary | ICD-10-CM | POA: Diagnosis not present

## 2022-07-18 DIAGNOSIS — E1159 Type 2 diabetes mellitus with other circulatory complications: Secondary | ICD-10-CM | POA: Diagnosis not present

## 2022-07-19 ENCOUNTER — Inpatient Hospital Stay: Payer: 59

## 2022-07-19 ENCOUNTER — Other Ambulatory Visit (HOSPITAL_COMMUNITY)
Admission: RE | Admit: 2022-07-19 | Discharge: 2022-07-19 | Disposition: A | Discharge status: Home / Self Care | Payer: 59 | Source: Ambulatory Visit | Attending: Physician Assistant | Admitting: Physician Assistant

## 2022-07-20 ENCOUNTER — Inpatient Hospital Stay: Payer: 59

## 2022-07-23 ENCOUNTER — Inpatient Hospital Stay: Payer: 59 | Attending: Physician Assistant

## 2022-07-23 DIAGNOSIS — D61818 Other pancytopenia: Secondary | ICD-10-CM | POA: Diagnosis not present

## 2022-07-23 LAB — CBC WITH DIFFERENTIAL/PLATELET
Abs Immature Granulocytes: 0.01 10*3/uL (ref 0.00–0.07)
Basophils Absolute: 0 10*3/uL (ref 0.0–0.1)
Basophils Relative: 1 %
Eosinophils Absolute: 0.2 10*3/uL (ref 0.0–0.5)
Eosinophils Relative: 7 %
HCT: 41.5 % (ref 39.0–52.0)
Hemoglobin: 14 g/dL (ref 13.0–17.0)
Immature Granulocytes: 0 %
Lymphocytes Relative: 19 %
Lymphs Abs: 0.4 10*3/uL — ABNORMAL LOW (ref 0.7–4.0)
MCH: 30.1 pg (ref 26.0–34.0)
MCHC: 33.7 g/dL (ref 30.0–36.0)
MCV: 89.2 fL (ref 80.0–100.0)
Monocytes Absolute: 0.2 10*3/uL (ref 0.1–1.0)
Monocytes Relative: 9 %
Neutro Abs: 1.4 10*3/uL — ABNORMAL LOW (ref 1.7–7.7)
Neutrophils Relative %: 64 %
Platelets: 42 10*3/uL — ABNORMAL LOW (ref 150–400)
RBC: 4.65 MIL/uL (ref 4.22–5.81)
RDW: 13.9 % (ref 11.5–15.5)
WBC: 2.2 10*3/uL — ABNORMAL LOW (ref 4.0–10.5)
nRBC: 0 % (ref 0.0–0.2)

## 2022-07-26 NOTE — Patient Instructions (Signed)
[name]  07/26/2022     @PREFPERIOPPHARMACY @   Your procedure is scheduled on  07/31/2022.   Report to [name] at  0730 A.M.   Call this number if you have problems the morning of surgery:  [phone]  If you experience any cold or flu symptoms such as cough, fever, chills, shortness of breath, etc. between now and your scheduled surgery, please notify us at the above number.   Remember:  Follow the diet instructions given to you by the office.     Take these medicines the morning of surgery with A SIP OF WATER        baclofen, suboxone, buspar, diltiazem, cymbalta, nexium, gabapentin, mobic or oxycodone (if needed), metoprolol.     Do not wear jewelry, make-up or nail polish, including gel polish,  artificial nails, or any other type of covering on natural nails (fingers and  toes).  Do not wear lotions, powders, or perfumes, or deodorant.  Do not shave 48 hours prior to surgery.  Men may shave face and neck.  Do not bring valuables to the hospital.  [facility] is not responsible for any belongings or valuables.  Contacts, dentures or bridgework may not be worn into surgery.  Leave your suitcase in the car.  After surgery it may be brought to your room.  For patients admitted to the hospital, discharge time will be determined by your treatment team.  Patients discharged the day of surgery will not be allowed to drive home and must have someone with them for 24 hours.    Special instructions:   DO NOT smoke tobacco or vape for 24 hours before your procedure.  Please read over the following fact sheets that you were given. Anesthesia Post-op Instructions and Care and Recovery After Surgery      Upper Endoscopy, Adult, Care After After the procedure, it is common to have a sore throat. It is also common to have: Mild stomach pain or discomfort. Bloating. Nausea. Follow these instructions at home: The instructions below may help you care for  yourself at home. Your health care provider may give you more instructions. If you have questions, ask your health care provider. If you were given a sedative during the procedure, it can affect you for several hours. Do not drive or operate machinery until your health care provider says that it is safe. If you will be going home right after the procedure, plan to have a responsible adult: Take you home from the hospital or clinic. You will not be allowed to drive. Care for you for the time you are told. Follow instructions from your health care provider about what you may eat and drink. Return to your normal activities as told by your health care provider. Ask your health care provider what activities are safe for you. Take over-the-counter and prescription medicines only as told by your health care provider. Contact a health care provider if you: Have a sore throat that lasts longer than one day. Have trouble swallowing. Have a fever. Get help right away if you: Vomit blood or your vomit looks like coffee grounds. Have bloody, black, or tarry stools. Have a very bad sore throat or you cannot swallow. Have difficulty breathing or very bad pain in your chest or abdomen. These symptoms may be an emergency. Get help right away. Call 911. Do not wait to see if the symptoms will go away. Do not drive yourself to the hospital.  Summary After the procedure, it is common to have a sore throat, mild stomach discomfort, bloating, and nausea. If you were given a sedative during the procedure, it can affect you for several hours. Do not drive until your health care provider says that it is safe. Follow instructions from your health care provider about what you may eat and drink. Return to your normal activities as told by your health care provider. This information is not intended to replace advice given to you by your health care provider. Make sure you discuss any questions you have with your health  care provider. Document Revised: 04/05/2021 Document Reviewed: 04/05/2021 Elsevier Patient Education  2024 [employer]. Monitored Anesthesia Care, Care After The following information offers guidance on how to care for yourself after your procedure. Your health care provider may also give you more specific instructions. If you have problems or questions, contact your health care provider. What can I expect after the procedure? After the procedure, it is common to have: Tiredness. Little or no memory about what happened during or after the procedure. Impaired judgment when it comes to making decisions. Nausea or vomiting. Some trouble with balance. Follow these instructions at home: For the time period you were told by your health care provider:  Rest. Do not participate in activities where you could fall or become injured. Do not drive or use machinery. Do not drink alcohol. Do not take sleeping pills or medicines that cause drowsiness. Do not make important decisions or sign legal documents. Do not take care of children on your own. Medicines Take over-the-counter and prescription medicines only as told by your health care provider. If you were prescribed antibiotics, take them as told by your health care provider. Do not stop using the antibiotic even if you start to feel better. Eating and drinking Follow instructions from your health care provider about what you may eat and drink. Drink enough fluid to keep your urine pale yellow. If you vomit: Drink clear fluids slowly and in small amounts as you are able. Clear fluids include water, ice chips, low-calorie sports drinks, and fruit juice that has water added to it (diluted fruit juice). Eat light and bland foods in small amounts as you are able. These foods include bananas, applesauce, rice, lean meats, toast, and crackers. General instructions  Have a responsible adult stay with you for the time you are told. It is important  to have someone help care for you until you are awake and alert. If you have sleep apnea, surgery and some medicines can increase your risk for breathing problems. Follow instructions from your health care provider about wearing your sleep device: When you are sleeping. This includes during daytime naps. While taking prescription pain medicines, sleeping medicines, or medicines that make you drowsy. Do not use any products that contain nicotine or tobacco. These products include cigarettes, chewing tobacco, and vaping devices, such as e-cigarettes. If you need help quitting, ask your health care provider. Contact a health care provider if: You feel nauseous or vomit every time you eat or drink. You feel light-headed. You are still sleepy or having trouble with balance after 24 hours. You get a rash. You have a fever. You have redness or swelling around the IV site. Get help right away if: You have trouble breathing. You have new confusion after you get home. These symptoms may be an emergency. Get help right away. Call 911. Do not wait to see if the symptoms will go away. Do  not drive yourself to the hospital. This information is not intended to replace advice given to you by your health care provider. Make sure you discuss any questions you have with your health care provider. Document Revised: 05/22/2021 Document Reviewed: 05/22/2021 Elsevier Patient Education  2024 [employer].

## 2022-07-27 ENCOUNTER — Other Ambulatory Visit: Payer: Self-pay

## 2022-07-27 ENCOUNTER — Encounter (HOSPITAL_COMMUNITY)
Admission: RE | Admit: 2022-07-27 | Discharge: 2022-07-27 | Disposition: A | Discharge status: Home / Self Care | Payer: 59 | Source: Ambulatory Visit | Attending: Gastroenterology | Admitting: Gastroenterology

## 2022-07-27 ENCOUNTER — Encounter (HOSPITAL_COMMUNITY): Payer: Self-pay

## 2022-07-27 VITALS — BP 128/88 | HR 75 | Temp 97.8°F | Resp 18 | Ht 72.0 in | Wt 375.9 lb

## 2022-07-27 DIAGNOSIS — K7030 Alcoholic cirrhosis of liver without ascites: Secondary | ICD-10-CM | POA: Diagnosis not present

## 2022-07-27 DIAGNOSIS — I10 Essential (primary) hypertension: Secondary | ICD-10-CM | POA: Insufficient documentation

## 2022-07-27 DIAGNOSIS — Z01812 Encounter for preprocedural laboratory examination: Secondary | ICD-10-CM | POA: Insufficient documentation

## 2022-07-27 HISTORY — DX: Cardiac arrhythmia, unspecified: I49.9

## 2022-07-27 LAB — COMPREHENSIVE METABOLIC PANEL
ALT: 23 U/L (ref 0–44)
AST: 41 U/L (ref 15–41)
Albumin: 3.3 g/dL — ABNORMAL LOW (ref 3.5–5.0)
Alkaline Phosphatase: 93 U/L (ref 38–126)
Anion gap: 8 (ref 5–15)
BUN: 9 mg/dL (ref 6–20)
CO2: 25 mmol/L (ref 22–32)
Calcium: 8.9 mg/dL (ref 8.9–10.3)
Chloride: 105 mmol/L (ref 98–111)
Creatinine, Ser: 0.59 mg/dL — ABNORMAL LOW (ref 0.61–1.24)
GFR, Estimated: >60 mL/min (ref >60)
Glucose, Bld: 129 mg/dL — ABNORMAL HIGH (ref 70–99)
Potassium: 4.3 mmol/L (ref 3.5–5.1)
Sodium: 138 mmol/L (ref 135–145)
Total Bilirubin: 1 mg/dL (ref 0.3–1.2)
Total Protein: 6.8 g/dL (ref 6.5–8.1)

## 2022-07-27 LAB — PROTIME-INR
INR: 1.1 (ref 0.8–1.2)
Prothrombin Time: 14 seconds (ref 11.4–15.2)

## 2022-07-31 ENCOUNTER — Ambulatory Visit (HOSPITAL_BASED_OUTPATIENT_CLINIC_OR_DEPARTMENT_OTHER): Payer: 59 | Admitting: Anesthesiology

## 2022-07-31 ENCOUNTER — Encounter (HOSPITAL_COMMUNITY)
Admission: RE | Disposition: A | Discharge status: Home / Self Care | Payer: Self-pay | Source: Ambulatory Visit | Attending: Gastroenterology

## 2022-07-31 ENCOUNTER — Ambulatory Visit (HOSPITAL_COMMUNITY)
Admission: RE | Admit: 2022-07-31 | Discharge: 2022-07-31 | Disposition: A | Discharge status: Home / Self Care | Payer: 59 | Source: Ambulatory Visit | Attending: Gastroenterology | Admitting: Gastroenterology

## 2022-07-31 ENCOUNTER — Encounter (HOSPITAL_COMMUNITY): Payer: Self-pay | Admitting: Gastroenterology

## 2022-07-31 ENCOUNTER — Ambulatory Visit (HOSPITAL_COMMUNITY): Payer: 59 | Admitting: Anesthesiology

## 2022-07-31 DIAGNOSIS — I5033 Acute on chronic diastolic (congestive) heart failure: Secondary | ICD-10-CM

## 2022-07-31 DIAGNOSIS — K219 Gastro-esophageal reflux disease without esophagitis: Secondary | ICD-10-CM | POA: Insufficient documentation

## 2022-07-31 DIAGNOSIS — Z8619 Personal history of other infectious and parasitic diseases: Secondary | ICD-10-CM | POA: Diagnosis not present

## 2022-07-31 DIAGNOSIS — Z800 Family history of malignant neoplasm of digestive organs: Secondary | ICD-10-CM | POA: Diagnosis not present

## 2022-07-31 DIAGNOSIS — D696 Thrombocytopenia, unspecified: Secondary | ICD-10-CM | POA: Insufficient documentation

## 2022-07-31 DIAGNOSIS — K3189 Other diseases of stomach and duodenum: Secondary | ICD-10-CM | POA: Insufficient documentation

## 2022-07-31 DIAGNOSIS — K317 Polyp of stomach and duodenum: Secondary | ICD-10-CM | POA: Diagnosis not present

## 2022-07-31 DIAGNOSIS — I8510 Secondary esophageal varices without bleeding: Secondary | ICD-10-CM | POA: Diagnosis not present

## 2022-07-31 DIAGNOSIS — K766 Portal hypertension: Secondary | ICD-10-CM | POA: Diagnosis not present

## 2022-07-31 DIAGNOSIS — I110 Hypertensive heart disease with heart failure: Secondary | ICD-10-CM

## 2022-07-31 DIAGNOSIS — I8500 Esophageal varices without bleeding: Secondary | ICD-10-CM

## 2022-07-31 DIAGNOSIS — Z09 Encounter for follow-up examination after completed treatment for conditions other than malignant neoplasm: Secondary | ICD-10-CM | POA: Insufficient documentation

## 2022-07-31 DIAGNOSIS — K259 Gastric ulcer, unspecified as acute or chronic, without hemorrhage or perforation: Secondary | ICD-10-CM

## 2022-07-31 DIAGNOSIS — I509 Heart failure, unspecified: Secondary | ICD-10-CM | POA: Diagnosis not present

## 2022-07-31 HISTORY — PX: ESOPHAGOGASTRODUODENOSCOPY (EGD) WITH PROPOFOL: SHX5813

## 2022-07-31 SURGERY — ESOPHAGOGASTRODUODENOSCOPY (EGD) WITH PROPOFOL
Anesthesia: General

## 2022-07-31 MED ORDER — LIDOCAINE HCL (CARDIAC) PF 100 MG/5ML IV SOSY
PREFILLED_SYRINGE | INTRAVENOUS | Status: DC | PRN
  Administered 2022-07-31: 100 mg via INTRAVENOUS

## 2022-07-31 MED ORDER — PANTOPRAZOLE SODIUM 40 MG PO TBEC: 40.0000 mg | DELAYED_RELEASE_TABLET | Freq: Two times a day (BID) | ORAL | 3 refills | Status: DC

## 2022-07-31 MED ORDER — LACTATED RINGERS IV SOLN: INTRAVENOUS | Status: DC | PRN

## 2022-07-31 MED ORDER — PROPOFOL 10 MG/ML IV BOLUS
INTRAVENOUS | Status: DC | PRN
  Administered 2022-07-31: 50 mg via INTRAVENOUS
  Administered 2022-07-31: 200 mg via INTRAVENOUS
  Administered 2022-07-31: 20 mg via INTRAVENOUS

## 2022-07-31 NOTE — Discharge Instructions (Addendum)
You are being discharged to home.  Resume your previous diet.  Your physician has recommended a repeat upper endoscopy in two years for surveillance.  Continue propanolol 20 mg qday - will discuss with Dr. Jeryl Columbia if possible to stop diltiazem and increase propranolol or switch to carvedilol. Alcohol cessation - this is imperative. Increase pantoprazole to 40 mg BID.

## 2022-07-31 NOTE — H&P (Signed)
[name] is an 57 y.o. male.   Chief Complaint: esophageal varices HPI: 44 -year-old male with past medical history of alcoholic cirrhosis complicated by hepatic encephalopathy, nonbleeding grade 1 esophageal varices, pancytopenia due to hypersplenism, atrial fibrillation, congestive heart failure, rheumatoid arthritis, OSA on CPAP, psoriasis, gout, depression, GERD, COPD, who came to the hospital for esophageal variceal surveillance.  Patient is still drinking alcohol, states he drinks a couple beers per week. The patient denies having any nausea, vomiting, fever, chills, hematochezia, melena, hematemesis, abdominal distention, abdominal pain, diarrhea, jaundice, pruritus or weight loss.    Past Medical History:  Diagnosis Date   Anxiety    Atrial fibrillation (HCC)    Blood clot in vein    blood clot in portal vein   CHF (congestive heart failure) (HCC)    Cirrhosis (HCC)    NASH   Constipation    COPD (chronic obstructive pulmonary disease) (HCC)    Dysrhythmia    GERD (gastroesophageal reflux disease)    Gout    Leukopenia 07/08/2015   Neuromuscular disorder (HCC)    neuropathy in hands and feet   Psoriasis    RA (rheumatoid arthritis) (HCC)    Sleep apnea    cpap used- level 10 and greater   Thrombocytopenia (HCC)     Past Surgical History:  Procedure Laterality Date   ANKLE SURGERY     right ankle talor repair   CHOLECYSTECTOMY  sept 2016   CHOLECYSTECTOMY     COLONOSCOPY  05/08/2011   Procedure: COLONOSCOPY;  Surgeon: Malissa Hippo, MD;  Location: [facility];  Service: Endoscopy;  Laterality: N/A;  730   COLONOSCOPY WITH PROPOFOL N/A 05/27/2020   Procedure: COLONOSCOPY WITH PROPOFOL;  Surgeon: Dolores Frame, MD;  Location: [facility];  Service: Gastroenterology;  Laterality: N/A;  Patient needs a unit of platelets prior to procedure.   ESOPHAGEAL BANDING N/A 05/27/2020   Procedure: ESOPHAGEAL BANDING;  Surgeon: Dolores Frame, MD;   Location: [facility];  Service: Gastroenterology;  Laterality: N/A;   ESOPHAGOGASTRODUODENOSCOPY  02/28/2011   Procedure: ESOPHAGOGASTRODUODENOSCOPY (EGD);  Surgeon: Malissa Hippo, MD;  Location: [facility];  Service: Endoscopy;  Laterality: N/A;  1200   ESOPHAGOGASTRODUODENOSCOPY N/A 06/11/2012   Procedure: ESOPHAGOGASTRODUODENOSCOPY (EGD);  Surgeon: Malissa Hippo, MD;  Location: [facility];  Service: Endoscopy;  Laterality: N/A;  1200  FYI patient is 400 pounds   ESOPHAGOGASTRODUODENOSCOPY (EGD) WITH PROPOFOL N/A 04/21/2014   Procedure: ESOPHAGOGASTRODUODENOSCOPY (EGD) WITH PROPOFOL;  Surgeon: Malissa Hippo, MD;  Location: [facility];  Service: Endoscopy;  Laterality: N/A;   ESOPHAGOGASTRODUODENOSCOPY (EGD) WITH PROPOFOL N/A 05/27/2020   Procedure: ESOPHAGOGASTRODUODENOSCOPY (EGD) WITH PROPOFOL;  Surgeon: Dolores Frame, MD;  [facility]: [facility];  Service: Gastroenterology;  Laterality: N/A;  7:30 am   ESOPHAGOGASTRODUODENOSCOPY (EGD) WITH PROPOFOL N/A 07/29/2020   Procedure: ESOPHAGOGASTRODUODENOSCOPY (EGD) WITH PROPOFOL;  Surgeon: Dolores Frame, MD;  Location: [facility];  Service: Gastroenterology;  Laterality: N/A;  8:20   HERNIA REPAIR Right    as child; inguinal   HERNIA REPAIR  sept 2016   LIVER BIOPSY  2012   SCIATIC NERVE EXPLORATION     TONSILLECTOMY      Family History  Problem Relation Age of Onset   Colon cancer Mother    Cancer Mother        intestine   Cancer Father        throat   Cancer Brother  brain   Cancer Sister    Heart attack Neg Hx    Stroke Neg Hx    Social History:  reports that he has never smoked. His smokeless tobacco use includes snuff and chew. He reports current alcohol use of about 4.0 standard drinks of alcohol per week. He reports that he does not use drugs.  Allergies:  Allergies  Allergen Reactions   Allopurinol Other (See Comments)    Joint pain worse     Medications Prior to Admission   Medication Sig Dispense Refill   aspirin EC 325 MG tablet Take 162.5 mg by mouth every 6 (six) hours as needed (chest pain).     baclofen (LIORESAL) 10 MG tablet TAKE 1 TABLET BY MOUTH THREE TIMES A DAY (Patient taking differently: Take 10 mg by mouth 3 (three) times daily as needed for muscle spasms.) 30 tablet 3   buprenorphine (SUBUTEX) 8 MG SUBL SL tablet Place 8 mg under the tongue every 8 (eight) hours.     busPIRone (BUSPAR) 10 MG tablet TAKE 1 TABLET BY MOUTH THREE TIMES A DAY (Patient taking differently: Take 10 mg by mouth 3 (three) times daily as needed (panic attacks).) 90 tablet 3   colchicine 0.6 MG tablet TAKE 1 TABLET BY MOUTH EVERY DAY (Patient taking differently: Take 1.2 mg by mouth daily as needed (gout attack).) 90 tablet 1   cyanocobalamin (V-R VITAMIN B-12) 500 MCG tablet Take 1 tablet (500 mcg total) by mouth every Monday, Wednesday, and Friday. 45 tablet 3   diltiazem (CARDIZEM CD) 300 MG 24 hr capsule TAKE ONE CAPSULE BY MOUTH EVERY DAY 90 capsule 3   doxepin (SINEQUAN) 10 MG capsule Take 10 mg by mouth at bedtime.     DULoxetine (CYMBALTA) 60 MG capsule Take 60 mg by mouth daily.     ezetimibe (ZETIA) 10 MG tablet Take 1 tablet (10 mg total) by mouth daily. 30 tablet 5   ferrous sulfate 325 (65 FE) MG EC tablet Take 1 tablet (325 mg total) by mouth daily with breakfast. 90 tablet 3   fluticasone (FLONASE) 50 MCG/ACT nasal spray USE 2 SPRAYS IN EACH NOSTRIL EVERY DAY AS NEEDED (Patient taking differently: Place 2 sprays into both nostrils daily as needed for allergies.) 48 mL 1   folic acid (FOLVITE) 800 MCG tablet Take 800 mcg by mouth daily.     gabapentin (NEURONTIN) 800 MG tablet Take 1 tablet (800 mg total) by mouth 3 (three) times daily. 270 tablet 3   ipratropium (ATROVENT) 0.02 % nebulizer solution Take 2.5 mLs (0.5 mg total) by nebulization every 6 (six) hours as needed for wheezing or shortness of breath. 75 mL 0   lactulose (CHRONULAC) 10 GM/15ML solution Take  30 mLs (20 g total) by mouth 2 (two) times daily. titate to 2-3 bowel movmeents per day 1892 mL 5   lidocaine (XYLOCAINE) 5 % ointment Apply 1 application topically as needed. 35.44 g 0   meloxicam (MOBIC) 15 MG tablet Take 15 mg by mouth daily as needed for pain.     oxyCODONE (OXY IR/ROXICODONE) 5 MG immediate release tablet Take 5 mg by mouth every 6 (six) hours as needed for severe pain.     pantoprazole (PROTONIX) 40 MG tablet Take 40 mg by mouth every morning.     pravastatin (PRAVACHOL) 10 MG tablet TAKE 1 TABLET BY MOUTH DAILY 90 tablet 1   propranolol (INDERAL) 20 MG tablet Take 20 mg by mouth daily.     sildenafil (  REVATIO) 20 MG tablet Take 2-5 tablets by mouth 30-60 minutes before intercourse 50 tablet 11   spironolactone (ALDACTONE) 50 MG tablet Take 1 tablet (50 mg total) by mouth daily. 30 tablet 6   albuterol (VENTOLIN HFA) 108 (90 Base) MCG/ACT inhaler INHALE TWO PUFFS BY MOUTH EVERY 6 HOURS AS NEEDED FOR SHORTNESS OF BREATH OR WHEEZING 18 g 3   betamethasone dipropionate 0.05 % cream APPLY TO AFFECTED AREA TWICE A DAY (Patient taking differently: Apply 1 application  topically daily as needed (itching).) 45 g 3   diltiazem (CARDIZEM SR) 90 MG 12 hr capsule Take 1 capsule (90 mg total) by mouth daily as needed. palpitations 30 capsule 3   Milk Thistle 1000 MG CAPS Take 1,000 mg by mouth in the morning, at noon, and at bedtime.     Multiple Vitamins-Minerals (MULTI COMPLETE PO) Take 1 tablet by mouth daily.      OneTouch Delica Lancets 30G MISC Test BS QID Dx E11.9 400 each 3    No results found. However, due to the size of the patient record, not all encounters were searched. Please check Results Review for a complete set of results. No results found.  Review of Systems  All other systems reviewed and are negative.   Blood pressure 132/79, pulse 70, temperature 97.8 F (36.6 C), temperature source Oral, resp. rate 17, height 6' (1.829 m), weight (!) 170.5 kg, SpO2  98%. Physical Exam  GENERAL: The patient is AO x3, in no acute distress. HEENT: Head is normocephalic and atraumatic. EOMI are intact. Mouth is well hydrated and without lesions. NECK: Supple. No masses LUNGS: Clear to auscultation. No presence of rhonchi/wheezing/rales. Adequate chest expansion HEART: RRR, normal s1 and s2. ABDOMEN: Soft, nontender, no guarding, no peritoneal signs, and nondistended. BS +. No masses. EXTREMITIES: Without any cyanosis, clubbing, rash, lesions or edema. NEUROLOGIC: AOx3, no focal motor deficit. SKIN: no jaundice, no rashes  Assessment/Plan  57 -year-old male with past medical history of alcoholic cirrhosis complicated by hepatic encephalopathy, nonbleeding grade 1 esophageal varices, pancytopenia due to hypersplenism, atrial fibrillation, congestive heart failure, rheumatoid arthritis, OSA on CPAP, psoriasis, gout, depression, GERD, COPD, who came to the hospital for esophageal variceal surveillance.  Will proceed with EGD.  Dolores Frame, MD 07/31/2022, 9:02 AM

## 2022-07-31 NOTE — Anesthesia Preprocedure Evaluation (Signed)
Anesthesia Evaluation  Patient identified by MRN, date of birth, ID band Patient awake    Reviewed: Allergy & Precautions, H&P , NPO status , Patient's Chart, lab work & pertinent test results, reviewed documented beta blocker date and time   Airway Mallampati: II  TM Distance: >3 FB Neck ROM: full    Dental no notable dental hx.    Pulmonary neg pulmonary ROS, asthma , sleep apnea , COPD, Patient abstained from smoking.   Pulmonary exam normal breath sounds clear to auscultation       Cardiovascular Exercise Tolerance: Good hypertension, + CAD and +CHF  negative cardio ROS + dysrhythmias Atrial Fibrillation  Rhythm:regular Rate:Normal     Neuro/Psych  Headaches PSYCHIATRIC DISORDERS Anxiety Depression     Neuromuscular disease negative neurological ROS  negative psych ROS   GI/Hepatic negative GI ROS,GERD  ,,(+) Cirrhosis         Endo/Other  negative endocrine ROS    Renal/GU negative Renal ROS  negative genitourinary   Musculoskeletal  (+) Arthritis ,    Abdominal   Peds  Hematology negative hematology ROS (+) Blood dyscrasia, anemia   Anesthesia Other Findings   Reproductive/Obstetrics negative OB ROS                             Anesthesia Physical Anesthesia Plan  ASA: 4  Anesthesia Plan: General   Post-op Pain Management:    Induction:   PONV Risk Score and Plan: Propofol infusion  Airway Management Planned:   Additional Equipment:   Intra-op Plan:   Post-operative Plan:   Informed Consent: I have reviewed the patients History and Physical, chart, labs and discussed the procedure including the risks, benefits and alternatives for the proposed anesthesia with the patient or authorized representative who has indicated his/her understanding and acceptance.     Dental Advisory Given  Plan Discussed with: CRNA  Anesthesia Plan Comments:        Anesthesia  Quick Evaluation

## 2022-07-31 NOTE — Op Note (Addendum)
[facility] Patient Name: [name] Procedure Date: 07/31/2022 9:37 AM MRN: [id number] Date of Birth: 12/24/1965 Attending MD: Katrinka Blazing , , [id number] CSN: [id number] Age: 57 Admit Type: Outpatient Procedure:                Upper GI endoscopy Indications:              Follow-up of esophageal varices Providers:                Katrinka Blazing, Sheran Fava, Dyann Ruddle Referring MD:              Medicines:                Monitored Anesthesia Care Complications:            No immediate complications. Estimated Blood Loss:     Estimated blood loss: none. Procedure:                Pre-Anesthesia Assessment:                           - Prior to the procedure, a History and Physical                            was performed, and patient medications, allergies                            and sensitivities were reviewed. The patient's                            tolerance of previous anesthesia was reviewed.                           - The risks and benefits of the procedure and the                            sedation options and risks were discussed with the                            patient. All questions were answered and informed                            consent was obtained.                           - ASA Grade Assessment: III - A patient with severe                            systemic disease.                           After obtaining informed consent, the endoscope was                            passed under direct vision. Throughout the                            procedure, the patient's blood  pressure, pulse, and                            oxygen saturations were monitored continuously. The                            GIF-H190 ([id number]) scope was introduced through the                            mouth, and advanced to the second part of duodenum.                            The upper GI endoscopy was accomplished without                            difficulty.  The patient tolerated the procedure                            well. Scope In: 9:48:58 AM Scope Out: 9:55:56 AM Total Procedure Duration: 0 hours 6 minutes 58 seconds  Findings:      Grade I varices were found in the lower third of the esophagus.      Moderate portal hypertensive gastropathy was found in the entire       examined stomach.      Six 5 to 20 mm semi-pedunculated polyps with no bleeding and stigmata of       recent bleeding were found in the gastric antrum. Polyps had an       ulcerated surface. I did not proceed with polypectomy given the       patient's severe thrombocytopenia and high risk of bleeding, as well as       normal hemoglobin.      The examined duodenum was normal. Impression:               - Grade I esophageal varices.                           - Portal hypertensive gastropathy.                           - Six gastric polyps. I did not proceed with                            polypectomy given the patient's severe                            thrombocytopenia and high risk of bleeding, as well                            as normal hemoglobin.                           - Normal examined duodenum.                           - No specimens collected. Moderate Sedation:      Per Anesthesia Care Recommendation:           -  Discharge patient to home (ambulatory).                           - Resume previous diet.                           - Repeat upper endoscopy in 2 years for                            surveillance.                           - Continue propanolol 20 mg qday - will discuss                            with Dr. Jeryl Columbia if possible to stop diltiazem and                            increase propranolol or switch to carvedilol.                           - Alcohol cessation - this is imperative.                           - No meloxicam, aspirin, ibuprofen, naproxen, or                            other non-steroidal anti-inflammatory drugs.                            - Increase pantoprazole to 40 mg BID. Procedure Code(s):        --- Professional ---                           [phone], Esophagogastroduodenoscopy, flexible,                            transoral; diagnostic, including collection of                            specimen(s) by brushing or washing, when performed                            (separate procedure) Diagnosis Code(s):        --- Professional ---                           I85.00, Esophageal varices without bleeding                           K76.6, Portal hypertension                           K31.89, Other diseases of stomach and duodenum                           K31.7,  Polyp of stomach and duodenum CPT copyright 2022 [employer]. All rights reserved. The codes documented in this report are preliminary and upon coder review may  be revised to meet current compliance requirements. Katrinka Blazing, MD Katrinka Blazing,  07/31/2022 10:10:50 AM This report has been signed electronically. Number of Addenda: 0

## 2022-07-31 NOTE — Transfer of Care (Signed)
Immediate Anesthesia Transfer of Care Note  Patient: [name]  Procedure(s) Performed: ESOPHAGOGASTRODUODENOSCOPY (EGD) WITH PROPOFOL  Patient Location: PACU  Anesthesia Type:General  Level of Consciousness: awake, alert , oriented, and patient cooperative  Airway & Oxygen Therapy: Patient Spontanous Breathing and Patient connected to nasal cannula oxygen  Post-op Assessment: Report given to RN, Post -op Vital signs reviewed and stable, and Patient moving all extremities X 4  Post vital signs: Reviewed and stable  Last Vitals:  Vitals Value Taken Time  BP    Temp    Pulse    Resp    SpO2     VSS and to be entered by PACU RN. Pt awake, talking and comfortable.  Last Pain:  Vitals:   07/31/22 0943  TempSrc:   PainSc: 0-No pain         Complications: No notable events documented.

## 2022-08-02 NOTE — Anesthesia Postprocedure Evaluation (Signed)
Anesthesia Post Note  Patient: [name]  Procedure(s) Performed: ESOPHAGOGASTRODUODENOSCOPY (EGD) WITH PROPOFOL  Patient location during evaluation: Phase II Anesthesia Type: General Level of consciousness: awake Pain management: pain level controlled Vital Signs Assessment: post-procedure vital signs reviewed and stable Respiratory status: spontaneous breathing and respiratory function stable Cardiovascular status: blood pressure returned to baseline and stable Postop Assessment: no headache and no apparent nausea or vomiting Anesthetic complications: no Comments: Late entry   No notable events documented.   Last Vitals:  Vitals:   07/31/22 0753 07/31/22 1002  BP: 132/79 108/69  Pulse: 70 71  Resp: 17 14  Temp: 36.6 C (!) 36.4 C  SpO2: 98% 97%    Last Pain:  Vitals:   08/01/22 1022  TempSrc:   PainSc: 0-No pain                 Windell Norfolk

## 2022-08-03 ENCOUNTER — Encounter (HOSPITAL_COMMUNITY): Payer: Self-pay | Admitting: Gastroenterology

## 2022-08-07 NOTE — Progress Notes (Signed)
Cardiology Office Note:  .   Date:  08/21/2022  ID:  IRVIN HOMANN, DOB February 19, 1965, MRN [id number] PCP: Beatrix Fetters, MD  [facility] Providers Cardiologist:  Rollene Rotunda, MD    History of Present Illness: .   [name] is a 57 y.o. male with history of PAF, Chronic diastolic CHF ,HTN, Chronic alcoholic cirrhosis ,complicated by hepatic encephalopathy, nonbleeding grade 1 esophageal varices, pancytopenia due to hypersplenism, Obesity.  Patient last saw Dr. Kief Lions 03/2021 and no anticoag. Gi saw 07/31/22 still drinking alcohol and endo showed grade 1 esophageal varicies and portal hypertensive gastropathy , 6 gastric polyps not removed due to severe thrombocytopenia and high risk of bleeding.Plts 43.  Patient comes in for f/u. Says he takes diltiazem 300 mg daily and 90 mg prn for fast HR but it's not on his med list. Coreg was started by GI but he hasn't picked it up yet. He gets pill packs and doesn't know which meds to take out. Dr. Levon Hedger spoke with Dr. Antoine Poche about the above changes per endo note.  ROS:    Studies Reviewed: Marland Kitchen    EKG Interpretation Date/Time:  Tuesday August 21 2022 12:29:48 EDT Ventricular Rate:  66 PR Interval:    QRS Duration:  78 QT Interval:  440 QTC Calculation: 461 R Axis:   100  Text Interpretation: Atrial fibrillation Rightward axis Low voltage QRS Septal infarct (cited on or before 17-May-2019) When compared with ECG of 27-Jul-2022 10:31, Vent. rate has decreased BY  32 BPM Confirmed by Jacolyn Reedy [phone]) on 08/21/2022 12:35:19 PM    Prior CV Studies: ECHO COMPLETE WITH IMAGING ENHANCING AGENT 04/22/2019  Narrative ECHOCARDIOGRAM REPORT    Patient Name:   [name] Ksiazek Date of Exam: 04/22/2019 Medical Rec #:  [id number]        Height:       71.5 in Accession #:    [id number]       Weight:       370.0 lb Date of Birth:  March 25, 1965        BSA:          2.753 m Patient Age:    53 years         BP:           92/69  mmHg Patient Gender: M                HR:           106 bpm. [name]  Procedure: 2D Echo  Indications:    CHF-Acute Diastolic 428.31 / I50.31  History:        Patient has prior history of Echocardiogram examinations, most recent 02/13/2012. Arrythmias:Atrial Fibrillation; Risk Factors:Diabetes, Dyslipidemia, Hypertension and Non-Smoker. Scrotal edema.  Sonographer:    Jeryl Columbia RDCS (AE) Referring Phys: [id number] JAN A MANSY  IMPRESSIONS   1. Left ventricular ejection fraction, by estimation, is 55 to 60%. The left ventricle has normal function. The left ventricle has no regional wall motion abnormalities. There is mild left ventricular hypertrophy. Left ventricular diastolic parameters are indeterminate. 2. Right ventricular systolic function is normal. The right ventricular size is normal. Tricuspid regurgitation signal is inadequate for assessing PA pressure. 3. Left atrial size was mildly dilated. 4. The mitral valve is grossly normal. Trivial mitral valve regurgitation. 5. The aortic valve is tricuspid. Aortic valve regurgitation is not visualized. 6. The inferior vena cava is normal in size with greater than 50% respiratory  variability, suggesting right atrial pressure of 3 mmHg.  FINDINGS Left Ventricle: Left ventricular ejection fraction, by estimation, is 55 to 60%. The left ventricle has normal function. The left ventricle has no regional wall motion abnormalities. Definity contrast agent was given IV to delineate the left ventricular endocardial borders. The left ventricular internal cavity size was normal in size. There is mild left ventricular hypertrophy. Left ventricular diastolic parameters are indeterminate.  Right Ventricle: The right ventricular size is normal. No increase in right ventricular wall thickness. Right ventricular systolic function is normal. Tricuspid regurgitation signal is inadequate for assessing PA pressure.  Left Atrium: Left  atrial size was mildly dilated.  Right Atrium: Right atrial size was normal in size.  Pericardium: There is no evidence of pericardial effusion. Presence of pericardial fat pad.  Mitral Valve: The mitral valve is grossly normal. Trivial mitral valve regurgitation.  Tricuspid Valve: The tricuspid valve is grossly normal. Tricuspid valve regurgitation is trivial.  Aortic Valve: The aortic valve is tricuspid. Aortic valve regurgitation is not visualized. Mild aortic valve annular calcification.  Pulmonic Valve: The pulmonic valve was grossly normal. Pulmonic valve regurgitation is trivial.  Aorta: The aortic root is normal in size and structure.  Venous: The inferior vena cava is normal in size with greater than 50% respiratory variability, suggesting right atrial pressure of 3 mmHg.  IAS/Shunts: No atrial level shunt detected by color flow Doppler.   LEFT VENTRICLE PLAX 2D LVIDd:         5.08 cm  Diastology LVIDs:         4.06 cm  LV e' lateral:   13.10 cm/s LV PW:         1.45 cm  LV E/e' lateral: 6.7 LV IVS:        1.29 cm  LV e' medial:    10.00 cm/s LVOT diam:     2.30 cm  LV E/e' medial:  8.8 LVOT Area:     4.15 cm   RIGHT VENTRICLE RV S prime:     16.80 cm/s TAPSE (M-mode): 1.8 cm  LEFT ATRIUM             Index LA diam:        4.60 cm 1.67 cm/m LA Vol (A2C):   68.8 ml 24.99 ml/m LA Vol (A4C):   97.3 ml 35.34 ml/m LA Biplane Vol: 84.0 ml 30.51 ml/m  AORTA Ao Root diam: 3.35 cm  MITRAL VALVE MV Area (PHT): 3.05 cm    SHUNTS MV Decel Time: 249 msec    Systemic Diam: 2.30 cm MV E velocity: 87.80 cm/s MV A velocity: 32.60 cm/s MV E/A ratio:  2.69  Nona Dell MD Electronically signed by Nona Dell MD Signature Date/Time: 04/22/2019/3:38:09 PM    Final       Risk Assessment/Calculations:    CHA2DS2-VASc Score = 2   This indicates a 2.2% annual risk of stroke. The patient's score is based upon: CHF History: 1 HTN History: 1 Diabetes  History: 0 Stroke History: 0 Vascular Disease History: 0 Age Score: 0 Gender Score: 0     Physical Exam:   VS:  BP (!) 142/88   Pulse 66   Ht 6' (1.829 m)   Wt (!) 380 lb (172.4 kg)   SpO2 94%   BMI 51.54 kg/m    Wt Readings from Last 3 Encounters:  08/21/22 (!) 380 lb (172.4 kg)  07/31/22 (!) 375 lb 14.2 oz (170.5 kg)  07/27/22 (!) 375 lb 14.2 oz (  170.5 kg)    GEN: Obese, in no acute distress NECK: No JVD; No carotid bruits CARDIAC:  irreg irreg, no murmurs, rubs, gallops RESPIRATORY:  Clear to auscultation without rales, wheezing or rhonchi  ABDOMEN: Soft, non-tender, non-distended EXTREMITIES:  mild brawny edema; No deformity   ASSESSMENT AND PLAN: .    PAF-patient asymptomatic for the most part. Still taking diltiazem 300mg  daily-supposed to switch to coreg 12.5 mg bid for esophageal varices. Patient hasn't done this yet. I asked him to contact his pharmacy to help with his pill packs. He takes diltiazem 90 mg prn for tachycardia. Coreg may need increased if it doesn't control his HR. He'll let us know when he makes the medication adjustments.  Chronic diastolic CHF compensated  HTN-up a little today. Reassess once on coreg.  Chornic alcoholic cirrhosis with pancytopenia followed closely by GI with recent endo. Says he quit drinking for now.  Obesity-in a wheel chair and walks with crutches.        Dispo: F/u Dr. Antoine Poche 3-4 months.  Signed, Jacolyn Reedy, PA-C

## 2022-08-11 DIAGNOSIS — G4733 Obstructive sleep apnea (adult) (pediatric): Secondary | ICD-10-CM | POA: Diagnosis not present

## 2022-08-11 DIAGNOSIS — J449 Chronic obstructive pulmonary disease, unspecified: Secondary | ICD-10-CM | POA: Diagnosis not present

## 2022-08-14 DIAGNOSIS — M4626 Osteomyelitis of vertebra, lumbar region: Secondary | ICD-10-CM | POA: Diagnosis not present

## 2022-08-14 DIAGNOSIS — M25579 Pain in unspecified ankle and joints of unspecified foot: Secondary | ICD-10-CM | POA: Diagnosis not present

## 2022-08-14 DIAGNOSIS — Z79891 Long term (current) use of opiate analgesic: Secondary | ICD-10-CM | POA: Diagnosis not present

## 2022-08-14 DIAGNOSIS — M25561 Pain in right knee: Secondary | ICD-10-CM | POA: Diagnosis not present

## 2022-08-14 DIAGNOSIS — Z79899 Other long term (current) drug therapy: Secondary | ICD-10-CM | POA: Diagnosis not present

## 2022-08-14 DIAGNOSIS — M25562 Pain in left knee: Secondary | ICD-10-CM | POA: Diagnosis not present

## 2022-08-14 DIAGNOSIS — G894 Chronic pain syndrome: Secondary | ICD-10-CM | POA: Diagnosis not present

## 2022-08-14 DIAGNOSIS — M549 Dorsalgia, unspecified: Secondary | ICD-10-CM | POA: Diagnosis not present

## 2022-08-17 ENCOUNTER — Telehealth (INDEPENDENT_AMBULATORY_CARE_PROVIDER_SITE_OTHER): Payer: Self-pay | Admitting: Gastroenterology

## 2022-08-17 ENCOUNTER — Telehealth (INDEPENDENT_AMBULATORY_CARE_PROVIDER_SITE_OTHER): Payer: Self-pay

## 2022-08-17 ENCOUNTER — Other Ambulatory Visit (INDEPENDENT_AMBULATORY_CARE_PROVIDER_SITE_OTHER): Payer: Self-pay | Admitting: Gastroenterology

## 2022-08-17 DIAGNOSIS — I8510 Secondary esophageal varices without bleeding: Secondary | ICD-10-CM

## 2022-08-17 MED ORDER — CARVEDILOL 12.5 MG PO TABS: 12.5000 mg | ORAL_TABLET | Freq: Two times a day (BID) | ORAL | 3 refills | Status: DC

## 2022-08-17 NOTE — Telephone Encounter (Signed)
He should also be advised to stop taking colchicine. Should follow with PCP for medications to prevent gout attacks. Thanks

## 2022-08-17 NOTE — Telephone Encounter (Signed)
Patient made aware of   He should also be advised to stop taking colchicine. Should follow with PCP for medications to prevent gout attacks. Thanks              08/17/22 10:31 AM You routed this conversation to Me       08/17/22  9:31 AM Dolores Frame, MD routed this conversation to Me       08/17/22  9:31 AM  Dolores Frame, MD signed an order       08/17/22  8:38 AM  Dolores Frame, MD pended an order Dolores Frame, MD      08/17/22  8:38 AM Note Discussed case with Dr. Antoine Poche.  He agreed on switching him to other medications for heart rate control.  We agreed that he could be started on carvedilol 12.5 mg twice a day.  Will also need to stop Cardizem and propranolol.   , please let him know that I discussed his case with his cardiologist.  I will send a prescription for carvedilol.  Please ask him to stop the Cardizem and propranolol and follow-up with cardiology as scheduled.

## 2022-08-17 NOTE — Telephone Encounter (Signed)
Patient made aware see other note that says Changes in Medications dated 08/17/2022.

## 2022-08-17 NOTE — Telephone Encounter (Signed)
Patient made aware of   He should also be advised to stop taking colchicine. Should follow with PCP for medications to prevent gout attacks. Thanks              08/17/22 10:31 AM You routed this conversation to Me       08/17/22  9:31 AM Dolores Frame, MD routed this conversation to Me       08/17/22  9:31 AM  Dolores Frame, MD signed an order       08/17/22  8:38 AM  Dolores Frame, MD pended an order Dolores Frame, MD      08/17/22  8:38 AM Note Discussed case with Dr. Antoine Poche.  He agreed on switching him to other medications for heart rate control.  We agreed that he could be started on carvedilol 12.5 mg twice a day.  Will also need to stop Cardizem and propranolol.   Miriah Maruyama, please let him know that I discussed his case with his cardiologist.  I will send a prescription for carvedilol.  Please ask him to stop the Cardizem and propranolol and follow-up with cardiology as scheduled.

## 2022-08-17 NOTE — Telephone Encounter (Signed)
Discussed case with Dr. Antoine Poche.  He agreed on switching him to other medications for heart rate control.  We agreed that he could be started on carvedilol 12.5 mg twice a day.  Will also need to stop Cardizem and propranolol.  Crystal, please let him know that I discussed his case with his cardiologist.  I will send a prescription for carvedilol.  Please ask him to stop the Cardizem and propranolol and follow-up with cardiology as scheduled.

## 2022-08-20 ENCOUNTER — Inpatient Hospital Stay: Payer: 59

## 2022-08-21 ENCOUNTER — Encounter: Payer: Self-pay | Admitting: Physician Assistant

## 2022-08-21 ENCOUNTER — Ambulatory Visit: Payer: 59 | Attending: Cardiology | Admitting: Physician Assistant

## 2022-08-21 ENCOUNTER — Inpatient Hospital Stay: Payer: 59 | Attending: Physician Assistant

## 2022-08-21 VITALS — BP 142/88 | HR 66 | Ht 72.0 in | Wt 380.0 lb

## 2022-08-21 DIAGNOSIS — K7030 Alcoholic cirrhosis of liver without ascites: Secondary | ICD-10-CM

## 2022-08-21 DIAGNOSIS — I10 Essential (primary) hypertension: Secondary | ICD-10-CM

## 2022-08-21 DIAGNOSIS — I480 Paroxysmal atrial fibrillation: Secondary | ICD-10-CM

## 2022-08-21 DIAGNOSIS — D61818 Other pancytopenia: Secondary | ICD-10-CM | POA: Insufficient documentation

## 2022-08-21 DIAGNOSIS — I5032 Chronic diastolic (congestive) heart failure: Secondary | ICD-10-CM

## 2022-08-21 DIAGNOSIS — K766 Portal hypertension: Secondary | ICD-10-CM

## 2022-08-21 LAB — CBC WITH DIFFERENTIAL/PLATELET
Abs Immature Granulocytes: 0.01 10*3/uL (ref 0.00–0.07)
Basophils Absolute: 0 10*3/uL (ref 0.0–0.1)
Basophils Relative: 2 %
Eosinophils Absolute: 0.1 10*3/uL (ref 0.0–0.5)
Eosinophils Relative: 5 %
HCT: 41.7 % (ref 39.0–52.0)
Hemoglobin: 14.1 g/dL (ref 13.0–17.0)
Immature Granulocytes: 1 %
Lymphocytes Relative: 28 %
Lymphs Abs: 0.6 10*3/uL — ABNORMAL LOW (ref 0.7–4.0)
MCH: 30.7 pg (ref 26.0–34.0)
MCHC: 33.8 g/dL (ref 30.0–36.0)
MCV: 90.8 fL (ref 80.0–100.0)
Monocytes Absolute: 0.2 10*3/uL (ref 0.1–1.0)
Monocytes Relative: 10 %
Neutro Abs: 1.1 10*3/uL — ABNORMAL LOW (ref 1.7–7.7)
Neutrophils Relative %: 54 %
Platelets: 43 10*3/uL — ABNORMAL LOW (ref 150–400)
RBC: 4.59 MIL/uL (ref 4.22–5.81)
RDW: 14 % (ref 11.5–15.5)
WBC: 2 10*3/uL — ABNORMAL LOW (ref 4.0–10.5)
nRBC: 0 % (ref 0.0–0.2)

## 2022-08-21 NOTE — Patient Instructions (Signed)
Medication Instructions:  Your physician recommends that you continue on your current medications as directed. Please refer to the Current Medication list given to you today.  Take your Pill Packs back to the pharmacy to have the Coreg added and the Diltiazem removed.  *If you need a refill on your cardiac medications before your next appointment, please call your pharmacy*   Lab Work: NONE   If you have labs (blood work) drawn today and your tests are completely normal, you will receive your results only by: MyChart Message (if you have MyChart) OR A paper copy in the mail If you have any lab test that is abnormal or we need to change your treatment, we will call you to review the results.   Testing/Procedures: NONE    Follow-Up: At [facility], you and your health needs are our priority.  As part of our continuing mission to provide you with exceptional heart care, we have created designated Provider Care Teams.  These Care Teams include your primary Cardiologist (physician) and Advanced Practice Providers (APPs -  Physician Assistants and Nurse Practitioners) who all work together to provide you with the care you need, when you need it.  We recommend signing up for the patient portal called "MyChart".  Sign up information is provided on this After Visit Summary.  MyChart is used to connect with patients for Virtual Visits (Telemedicine).  Patients are able to view lab/test results, encounter notes, upcoming appointments, etc.  Non-urgent messages can be sent to your provider as well.   To learn more about what you can do with MyChart, go to [email or web address].    Your next appointment:   6 month(s)  Provider:   Rollene Rotunda, MD    Other Instructions Thank you for choosing [facility]!

## 2022-08-23 DIAGNOSIS — R58 Hemorrhage, not elsewhere classified: Secondary | ICD-10-CM | POA: Diagnosis not present

## 2022-08-23 DIAGNOSIS — R519 Headache, unspecified: Secondary | ICD-10-CM | POA: Diagnosis not present

## 2022-08-23 DIAGNOSIS — S01511A Laceration without foreign body of lip, initial encounter: Secondary | ICD-10-CM | POA: Diagnosis not present

## 2022-08-23 DIAGNOSIS — M1711 Unilateral primary osteoarthritis, right knee: Secondary | ICD-10-CM | POA: Diagnosis not present

## 2022-08-23 DIAGNOSIS — J449 Chronic obstructive pulmonary disease, unspecified: Secondary | ICD-10-CM | POA: Diagnosis not present

## 2022-08-23 DIAGNOSIS — M25562 Pain in left knee: Secondary | ICD-10-CM | POA: Diagnosis not present

## 2022-08-23 DIAGNOSIS — S0993XA Unspecified injury of face, initial encounter: Secondary | ICD-10-CM | POA: Diagnosis not present

## 2022-08-23 DIAGNOSIS — M25561 Pain in right knee: Secondary | ICD-10-CM | POA: Diagnosis not present

## 2022-08-23 DIAGNOSIS — I509 Heart failure, unspecified: Secondary | ICD-10-CM | POA: Diagnosis not present

## 2022-08-23 DIAGNOSIS — F17220 Nicotine dependence, chewing tobacco, uncomplicated: Secondary | ICD-10-CM | POA: Diagnosis not present

## 2022-08-23 DIAGNOSIS — M25461 Effusion, right knee: Secondary | ICD-10-CM | POA: Diagnosis not present

## 2022-08-23 DIAGNOSIS — S060X0A Concussion without loss of consciousness, initial encounter: Secondary | ICD-10-CM | POA: Diagnosis not present

## 2022-08-23 DIAGNOSIS — S8991XA Unspecified injury of right lower leg, initial encounter: Secondary | ICD-10-CM | POA: Diagnosis not present

## 2022-08-26 ENCOUNTER — Other Ambulatory Visit: Payer: Self-pay | Admitting: Cardiology

## 2022-08-26 DIAGNOSIS — I5032 Chronic diastolic (congestive) heart failure: Secondary | ICD-10-CM

## 2022-08-26 DIAGNOSIS — I480 Paroxysmal atrial fibrillation: Secondary | ICD-10-CM

## 2022-08-26 DIAGNOSIS — E1159 Type 2 diabetes mellitus with other circulatory complications: Secondary | ICD-10-CM

## 2022-08-27 ENCOUNTER — Telehealth (INDEPENDENT_AMBULATORY_CARE_PROVIDER_SITE_OTHER): Payer: Self-pay | Admitting: *Deleted

## 2022-08-27 NOTE — Telephone Encounter (Signed)
Sam from [facility] left vm stating pt told her meds were changed and he did not know what meds were changed and to call office to find out. I called pharmacy back and let them know on 08/16/21 Dr. Levon Hedger said to stop colchincine, stop cardizem, stop propranolol and start carvediolol 12.5 bid.

## 2022-09-04 ENCOUNTER — Telehealth (INDEPENDENT_AMBULATORY_CARE_PROVIDER_SITE_OTHER): Payer: Self-pay

## 2022-09-04 ENCOUNTER — Other Ambulatory Visit (INDEPENDENT_AMBULATORY_CARE_PROVIDER_SITE_OTHER): Payer: Self-pay | Admitting: Gastroenterology

## 2022-09-04 DIAGNOSIS — K7030 Alcoholic cirrhosis of liver without ascites: Secondary | ICD-10-CM

## 2022-09-04 NOTE — Telephone Encounter (Signed)
Patient called today to report he has been having pain in his umbilical area. Patient says he has a hernia there and the pain is worsening. He was scheduled for an u/s but forgot about it. I transferred him to Kenney Houseman to reschedule this for him.    Patient also mentions he has had diarrhea on going for the last 3.5 weeks. He says it is no form to them at all, nothing but liquid. He says he has between 2-3 loose bm's per day.He is not currently taking any anti diarrheals. He denies any fevers, or blood in stools, but does have the umbilical pain as per the above message.

## 2022-09-05 NOTE — Telephone Encounter (Signed)
Tried calling the patient, no answer and vm was full. Unable to leave a message.

## 2022-09-05 NOTE — Telephone Encounter (Signed)
Patient says he usually takes Lactulose bid, but he has not taken it in the last week since the diarrhea started. Patient stopped colchicine and cardiazem, propranol. Supposed to start carvedilol 12.5 bid, but says he has not picked up the carvedilol yet.

## 2022-09-06 ENCOUNTER — Other Ambulatory Visit (INDEPENDENT_AMBULATORY_CARE_PROVIDER_SITE_OTHER): Payer: Self-pay | Admitting: Gastroenterology

## 2022-09-06 DIAGNOSIS — R197 Diarrhea, unspecified: Secondary | ICD-10-CM

## 2022-09-06 NOTE — Telephone Encounter (Signed)
I have spoke with the patient and made her aware that We are checking some stool studies, Orders at [facility] and he will need to pickup the containers. He is aware he needs to start the carvedilol asap, this is for bleeding prevention of his esophageal varices/keeping heart rate controlled. Patient states understanding.

## 2022-09-11 DIAGNOSIS — M25562 Pain in left knee: Secondary | ICD-10-CM | POA: Diagnosis not present

## 2022-09-11 DIAGNOSIS — M4626 Osteomyelitis of vertebra, lumbar region: Secondary | ICD-10-CM | POA: Diagnosis not present

## 2022-09-11 DIAGNOSIS — Z79891 Long term (current) use of opiate analgesic: Secondary | ICD-10-CM | POA: Diagnosis not present

## 2022-09-11 DIAGNOSIS — Z79899 Other long term (current) drug therapy: Secondary | ICD-10-CM | POA: Diagnosis not present

## 2022-09-11 DIAGNOSIS — M25561 Pain in right knee: Secondary | ICD-10-CM | POA: Diagnosis not present

## 2022-09-11 DIAGNOSIS — M549 Dorsalgia, unspecified: Secondary | ICD-10-CM | POA: Diagnosis not present

## 2022-09-11 DIAGNOSIS — G894 Chronic pain syndrome: Secondary | ICD-10-CM | POA: Diagnosis not present

## 2022-09-11 DIAGNOSIS — M25579 Pain in unspecified ankle and joints of unspecified foot: Secondary | ICD-10-CM | POA: Diagnosis not present

## 2022-09-18 ENCOUNTER — Ambulatory Visit (HOSPITAL_COMMUNITY)
Admission: RE | Admit: 2022-09-18 | Discharge: 2022-09-18 | Disposition: A | Discharge status: Home / Self Care | Payer: 59 | Source: Ambulatory Visit | Attending: Gastroenterology | Admitting: Gastroenterology

## 2022-09-18 DIAGNOSIS — K7030 Alcoholic cirrhosis of liver without ascites: Secondary | ICD-10-CM | POA: Insufficient documentation

## 2022-09-18 DIAGNOSIS — K7460 Unspecified cirrhosis of liver: Secondary | ICD-10-CM | POA: Diagnosis not present

## 2022-09-18 DIAGNOSIS — I81 Portal vein thrombosis: Secondary | ICD-10-CM | POA: Diagnosis not present

## 2022-09-18 DIAGNOSIS — K7689 Other specified diseases of liver: Secondary | ICD-10-CM | POA: Diagnosis not present

## 2022-09-18 DIAGNOSIS — Z9049 Acquired absence of other specified parts of digestive tract: Secondary | ICD-10-CM | POA: Diagnosis not present

## 2022-09-20 ENCOUNTER — Inpatient Hospital Stay: Payer: 59 | Attending: Physician Assistant

## 2022-09-20 DIAGNOSIS — D61818 Other pancytopenia: Secondary | ICD-10-CM | POA: Insufficient documentation

## 2022-09-20 DIAGNOSIS — E538 Deficiency of other specified B group vitamins: Secondary | ICD-10-CM | POA: Insufficient documentation

## 2022-09-20 DIAGNOSIS — R161 Splenomegaly, not elsewhere classified: Secondary | ICD-10-CM | POA: Insufficient documentation

## 2022-09-20 DIAGNOSIS — K7460 Unspecified cirrhosis of liver: Secondary | ICD-10-CM | POA: Insufficient documentation

## 2022-09-21 ENCOUNTER — Inpatient Hospital Stay: Payer: 59 | Attending: Physician Assistant

## 2022-09-21 DIAGNOSIS — E538 Deficiency of other specified B group vitamins: Secondary | ICD-10-CM

## 2022-09-21 DIAGNOSIS — D509 Iron deficiency anemia, unspecified: Secondary | ICD-10-CM

## 2022-09-21 DIAGNOSIS — D61818 Other pancytopenia: Secondary | ICD-10-CM | POA: Diagnosis not present

## 2022-09-21 DIAGNOSIS — K7460 Unspecified cirrhosis of liver: Secondary | ICD-10-CM | POA: Diagnosis not present

## 2022-09-21 DIAGNOSIS — D500 Iron deficiency anemia secondary to blood loss (chronic): Secondary | ICD-10-CM

## 2022-09-21 DIAGNOSIS — R161 Splenomegaly, not elsewhere classified: Secondary | ICD-10-CM | POA: Diagnosis not present

## 2022-09-21 DIAGNOSIS — D708 Other neutropenia: Secondary | ICD-10-CM

## 2022-09-21 DIAGNOSIS — D696 Thrombocytopenia, unspecified: Secondary | ICD-10-CM

## 2022-09-21 LAB — COMPREHENSIVE METABOLIC PANEL
ALT: 16 U/L (ref 0–44)
AST: 35 U/L (ref 15–41)
Albumin: 3.1 g/dL — ABNORMAL LOW (ref 3.5–5.0)
Alkaline Phosphatase: 98 U/L (ref 38–126)
Anion gap: 8 (ref 5–15)
BUN: 6 mg/dL (ref 6–20)
CO2: 23 mmol/L (ref 22–32)
Calcium: 7.9 mg/dL — ABNORMAL LOW (ref 8.9–10.3)
Chloride: 105 mmol/L (ref 98–111)
Creatinine, Ser: 0.71 mg/dL (ref 0.61–1.24)
GFR, Estimated: >60 mL/min (ref >60)
Glucose, Bld: 128 mg/dL — ABNORMAL HIGH (ref 70–99)
Potassium: 3.9 mmol/L (ref 3.5–5.1)
Sodium: 136 mmol/L (ref 135–145)
Total Bilirubin: 0.7 mg/dL (ref 0.3–1.2)
Total Protein: 6.2 g/dL — ABNORMAL LOW (ref 6.5–8.1)

## 2022-09-21 LAB — CBC WITH DIFFERENTIAL/PLATELET
Abs Immature Granulocytes: 0 10*3/uL (ref 0.00–0.07)
Band Neutrophils: 1 %
Basophils Absolute: 0 10*3/uL (ref 0.0–0.1)
Basophils Relative: 0 %
Eosinophils Absolute: 0 10*3/uL (ref 0.0–0.5)
Eosinophils Relative: 3 %
HCT: 39.4 % (ref 39.0–52.0)
Hemoglobin: 12.9 g/dL — ABNORMAL LOW (ref 13.0–17.0)
Lymphocytes Relative: 27 %
Lymphs Abs: 0.4 10*3/uL — ABNORMAL LOW (ref 0.7–4.0)
MCH: 30.4 pg (ref 26.0–34.0)
MCHC: 32.7 g/dL (ref 30.0–36.0)
MCV: 92.9 fL (ref 80.0–100.0)
Monocytes Absolute: 0 10*3/uL — ABNORMAL LOW (ref 0.1–1.0)
Monocytes Relative: 3 %
Neutro Abs: 1 10*3/uL — ABNORMAL LOW (ref 1.7–7.7)
Neutrophils Relative %: 66 %
Platelets: 48 10*3/uL — ABNORMAL LOW (ref 150–400)
RBC: 4.24 MIL/uL (ref 4.22–5.81)
RDW: 14.2 % (ref 11.5–15.5)
WBC: 1.5 10*3/uL — ABNORMAL LOW (ref 4.0–10.5)
nRBC: 0 % (ref 0.0–0.2)

## 2022-09-21 LAB — FERRITIN: Ferritin: 95 ng/mL (ref 24–336)

## 2022-09-21 LAB — IRON AND TIBC
Iron: 59 ug/dL (ref 45–182)
Saturation Ratios: 21 % (ref 17.9–39.5)
TIBC: 285 ug/dL (ref 250–450)
UIBC: 226 ug/dL

## 2022-09-21 LAB — VITAMIN B12: Vitamin B-12: 615 pg/mL (ref 180–914)

## 2022-09-21 LAB — FOLATE: Folate: 17.6 ng/mL (ref >5.9)

## 2022-09-24 LAB — METHYLMALONIC ACID, SERUM: Methylmalonic Acid, Quantitative: 235 nmol/L (ref 0–378)

## 2022-09-25 NOTE — Progress Notes (Unsigned)
VIRTUAL VISIT via TELEPHONE NOTE [facility]   I connected with [name]  on 09/26/22 at  8:15 AM by telephone and verified that I am speaking with the correct person using two identifiers.  Location: Patient: Home Provider: [facility]   I discussed the limitations, risks, security and privacy concerns of performing an evaluation and management service by telephone and the availability of in person appointments. I also discussed with the patient that there may be a patient responsible charge related to this service. The patient expressed understanding and agreed to proceed.  REASON FOR VISIT: Follow-up for pancytopenia   CURRENT THERAPY: Close surveillance, intermittent IV iron (last Feraheme 04/05/2020 and 04/21/2020)  INTERVAL HISTORY:  [name] is contacted today for follow-up of leukopenia/thrombocytopenia (secondary to cirrhosis and splenomegaly) and iron deficiency anemia (secondary to malabsorption in the setting of alcohol abuse and cirrhosis, possible slow chronic blood loss from varices and portal hypertensive gastropathy).  His last IV iron infusion was Feraheme on 04/05/2020 and 04/21/2020.  He has not required PRBC blood transfusions.  He was last evaluated via telemedicine visit by Rojelio Brenner PA-C on 06/19/2022.   At today's visit, he reports feeling "okay."  He remains somewhat fatigued.  He continues to take daily iron, daily folate, and vitamin B12 500 mcg 3 times weekly.   He reports easy bruising but denies any petechial rash.  He bleeds easily when cut, but is able to stop bleeding within a reasonable time. He has had dark (not tarry)  bowel movements at times, but was Hemoccult negative when checked at [facility].  He denies any hematochezia, hematuria, epistaxis, hemoptysis, hematemesis, or gum bleeding.  He has not had any infections since his spinal osteomyelitis in October 2022.   He denies any B symptoms such  as fever, chills, night sweats, unintentional weight loss.  He has stable shortness of breath on exertion but denies any recent chest pain, presyncopal episodes, or palpitations.  No pica.  He has occasional chest discomfort associated with panic attacks.  He reports that he had "been drinking again," but stopped a few days ago.  He has 50% energy and 100% appetite. He endorses that he is maintaining a stable weight.  REVIEW OF SYSTEMS:   Review of Systems  Constitutional:  Positive for malaise/fatigue. Negative for chills, diaphoresis, fever and weight loss.  Respiratory:  Positive for shortness of breath (with exertion). Negative for cough.   Cardiovascular:  Positive for chest pain (with panic attacks). Negative for palpitations.  Gastrointestinal:  Positive for diarrhea. Negative for abdominal pain, blood in stool, melena, nausea and vomiting.  Musculoskeletal:  Positive for back pain and joint pain.  Neurological:  Positive for tingling. Negative for dizziness and headaches.  Psychiatric/Behavioral:  The patient is nervous/anxious.      PHYSICAL EXAM: (per limitations of virtual telephone visit)  The patient is alert and oriented x 3, exhibiting adequate mentation, good mood, and ability to speak in full sentences and execute sound judgement.  ASSESSMENT & PLAN:  1.  Pancytopenia, secondary to cirrhosis of the liver and splenomegaly - Secondary to cirrhosis and splenomegaly. - Status post bone marrow aspiration and biopsy on 10/24/2015 that demonstrated normocellular bone marrow with trilineage hematopoiesis and normal cytogenetics - Abdominal ultrasound performed on 03/02/2020 showed liver cirrhosis and marked splenomegaly, with spleen measuring 20.6 cm in length with a calculated volume 2334 mL - Nutritional panel from 06/21/2020 showed normal B12, normal folate; SPEP  normal - Patient had platelet transfusion on 05/27/2020 prior to his EGD/colonoscopy - Most recent labs (09/21/2022):  WBC 1.5/ANC 1.0/ALC 0.4/monocytes 0.0.  Platelets 48.  Hgb 12.9.  CMP at baseline.  Previous immature platelet fraction and LDH was normal. - Positive for easy bruising without petechial rash.  No major bleeding events.  - Intermittent dark bowel movements, but was Hemoccult negative when checked at [facility]. - No B symptoms or frequent infections.   - DISCUSSION: Do not recommend partial splenic embolization (PSE) at this time, based on the following: Although chronic liver disease is associated with thrombocytopenia, severe thrombocytopenia (platelets <30-40 K) is rare. PSE has high risk of complications, and improvement in platelet count does not appear to be longstanding. PSE would only affect one aspect of multifactorial causes of thrombocytopenia in cirrhotic patients, and would not resolve underlying portal hypertension that is felt to be the driving risk factor of bleeding in the majority of cirrhotic patients. Overall, patients with liver cirrhosis have rebalanced hemostasis due to reduced synthesis of procoagulants as well as reduced synthesis of anticoagulants, increased production of VWF, thrombocytopenia, and impaired platelet function.  Although the net effect of these changes is rebalanced hemostasis, hemostatic equilibrium is unstable and can be altered either toward bleeding or thrombosis depending on clinical conditions.  (Patient could be tipped more toward hemorrhage if triggered by progressive portal hypertension, renal failure, or infection.) PSE could be considered in cases of splenic vein thrombosis or if the patient developed bleeding gastric varices. Long-term use of TPO receptor agonists is not currently recommended due to increased risk of portal vein thrombus.  If necessary, could be given preoperatively if platelets <50 K.  - PLAN: Platelets remain low but at usual baseline, without any evidence of active bleeding.  He may also have some increased platelet consumption  in the setting of known portal vein thrombus.   - We will continue monitor closely.  He would like to continue monthly CBC/D, especially now that he has stopped drinking alcohol.   2. Portal vein thrombus - Abdominal ultrasound performed 03/02/2020 continues to show chronic portal vein thrombus described as a stable nonocclusive intraluminal thrombus within the main portal vein - History of GI bleeding while on warfarin for portal vein thrombus - Not a candidate for anticoagulation due to thrombocytopenia, history of GI bleeding, and ongoing bleeding risk   3.  Iron deficiency anemia - Likely secondary to malabsorption of nutrients in the setting of alcohol use and cirrhosis, may also have some slow chronic blood loss  - Unable to tolerate oral iron - Has required intermittent IV infusions, with last IV Venofer given in March/April 2022 - Recent EGD (07/31/2022): Grade 1 esophageal varices.  Moderate portal hypertensive gastropathy.  (Previously required banding for varices and had findings consistent with gastric antral vascular ectasia) - Recent colonoscopy (05/27/2020): two nonbleeding colonic angiodysplastic lesions; nonbleeding internal hemorrhoids; diverticulosis in sigmoid and descending colon - He is taking iron tablet daily.   - He denies any gross rectal bleeding.  He has intermittent dark bowel movements, but was Hemoccult negative when checked at [facility]. - Most recent labs (09/21/2022): Hgb 12.9/MCV 92.9.  Ferritin 95, iron saturation 21%. - PLAN: Recommend IV Feraheme x 1. - Continue oral iron supplementation.   - We will repeat labs and RTC in 4 months.    4.  Vitamin B12 and folic acid deficiency - Labs from 10/09/2021 show marginal B12 288 with normal MMA. - He is taking vitamin B12  500 mcg on MWF and folic acid 400 mcg daily - Most recent labs (09/21/2022): Normal B12 at 615, normal MMA.  Normal folate. - PLAN: Continue vitamin B12 500 mcg 3 times weekly and folic acid  daily.  Recheck in 6 months (March 2025)   5. Liver cirrhosis - Follows with GI for hepatologic management - Receiving abdominal ultrasounds via GI every 6 months to screen for [facility], given his high risk status of developing hepatocellular carcinoma - PLAN: Continue follow-up with Dr. Levon Hedger and NP Doylene Bode  PLAN SUMMARY: >> IV Feraheme x 1 >> Monthly CBC/D >> Labs in 4 months = CMP, CBC/D, ferritin, iron/TIBC >> OFFICE visit in 4 months (1 week after labs)  ** Annual OFFICE visit next due February 2025     I discussed the assessment and treatment plan with the patient. The patient was provided an opportunity to ask questions and all were answered. The patient agreed with the plan and demonstrated an understanding of the instructions.   The patient was advised to call back or seek an in-person evaluation if the symptoms worsen or if the condition fails to improve as anticipated.  I provided 28 minutes of non-face-to-face time during this encounter.  Carnella Guadalajara, PA-C 09/26/22 8:43 AM

## 2022-09-26 ENCOUNTER — Inpatient Hospital Stay: Payer: 59 | Admitting: Physician Assistant

## 2022-09-26 DIAGNOSIS — D708 Other neutropenia: Secondary | ICD-10-CM | POA: Diagnosis not present

## 2022-09-26 DIAGNOSIS — D696 Thrombocytopenia, unspecified: Secondary | ICD-10-CM

## 2022-09-26 DIAGNOSIS — D61818 Other pancytopenia: Secondary | ICD-10-CM | POA: Diagnosis not present

## 2022-09-26 DIAGNOSIS — D500 Iron deficiency anemia secondary to blood loss (chronic): Secondary | ICD-10-CM

## 2022-09-26 DIAGNOSIS — E538 Deficiency of other specified B group vitamins: Secondary | ICD-10-CM

## 2022-09-26 MED ORDER — CYANOCOBALAMIN 500 MCG PO TABS
500.0000 ug | ORAL_TABLET | ORAL | 3 refills | Status: DC
Start: 2022-09-26 — End: 2023-02-04

## 2022-10-01 ENCOUNTER — Inpatient Hospital Stay: Payer: 59

## 2022-10-01 VITALS — BP 115/86 | HR 71 | Temp 96.6°F | Resp 16

## 2022-10-01 DIAGNOSIS — E538 Deficiency of other specified B group vitamins: Secondary | ICD-10-CM | POA: Diagnosis not present

## 2022-10-01 DIAGNOSIS — R161 Splenomegaly, not elsewhere classified: Secondary | ICD-10-CM | POA: Diagnosis not present

## 2022-10-01 DIAGNOSIS — D61818 Other pancytopenia: Secondary | ICD-10-CM | POA: Diagnosis not present

## 2022-10-01 DIAGNOSIS — K7460 Unspecified cirrhosis of liver: Secondary | ICD-10-CM | POA: Diagnosis not present

## 2022-10-01 DIAGNOSIS — D509 Iron deficiency anemia, unspecified: Secondary | ICD-10-CM

## 2022-10-01 MED ORDER — CETIRIZINE HCL 10 MG PO TABS
10.0000 mg | ORAL_TABLET | Freq: Once | ORAL | Status: AC
  Administered 2022-10-01: 10 mg via ORAL
  Filled 2022-10-01: qty 1

## 2022-10-01 MED ORDER — SODIUM CHLORIDE 0.9 % IV SOLN: Freq: Once | INTRAVENOUS | Status: AC

## 2022-10-01 MED ORDER — SODIUM CHLORIDE 0.9 % IV SOLN
510.0000 mg | Freq: Once | INTRAVENOUS | Status: AC
  Administered 2022-10-01: 510 mg via INTRAVENOUS
  Filled 2022-10-01: qty 510

## 2022-10-01 NOTE — Progress Notes (Signed)
Feraheme iron infusion given per orders. Patient tolerated it well without problems. Vitals stable and discharged home from clinic via wheelchair. Follow up as scheduled.

## 2022-10-01 NOTE — Patient Instructions (Signed)
[facility]  Discharge Instructions: Thank you for choosing [facility] to provide your oncology and hematology care.  If you have a lab appointment with the Cancer Center - please note that after April 8th, 2024, all labs will be drawn in the cancer center.  You do not have to check in or register with the main entrance as you have in the past but will complete your check-in in the cancer center.  Wear comfortable clothing and clothing appropriate for easy access to any Portacath or PICC line.   We strive to give you quality time with your provider. You may need to reschedule your appointment if you arrive late (15 or more minutes).  Arriving late affects you and other patients whose appointments are after yours.  Also, if you miss three or more appointments without notifying the office, you may be dismissed from the clinic at the provider's discretion.      For prescription refill requests, have your pharmacy contact our office and allow 72 hours for refills to be completed.    Today you received the following iron infusion: Feraheme   To help prevent nausea and vomiting after your treatment, we encourage you to take your nausea medication as directed.  BELOW ARE SYMPTOMS THAT SHOULD BE REPORTED IMMEDIATELY: *FEVER GREATER THAN 100.4 F (38 C) OR HIGHER *CHILLS OR SWEATING *NAUSEA AND VOMITING THAT IS NOT CONTROLLED WITH YOUR NAUSEA MEDICATION *UNUSUAL SHORTNESS OF BREATH *UNUSUAL BRUISING OR BLEEDING *URINARY PROBLEMS (pain or burning when urinating, or frequent urination) *BOWEL PROBLEMS (unusual diarrhea, constipation, pain near the anus) TENDERNESS IN MOUTH AND THROAT WITH OR WITHOUT PRESENCE OF ULCERS (sore throat, sores in mouth, or a toothache) UNUSUAL RASH, SWELLING OR PAIN  UNUSUAL VAGINAL DISCHARGE OR ITCHING   Items with * indicate a potential emergency and should be followed up as soon as possible or go to the Emergency Department if any  problems should occur.  Please show the CHEMOTHERAPY ALERT CARD or IMMUNOTHERAPY ALERT CARD at check-in to the Emergency Department and triage nurse.  Should you have questions after your visit or need to cancel or reschedule your appointment, please contact [facility] AT [facility] [phone]  and follow the prompts.  Office hours are 8:00 a.m. to 4:30 p.m. Monday - Friday. Please note that voicemails left after 4:00 p.m. may not be returned until the following business day.  We are closed weekends and major holidays. You have access to a nurse at all times for urgent questions. Please call the main number to the clinic [phone] and follow the prompts.  For any non-urgent questions, you may also contact your provider using MyChart. We now offer e-Visits for anyone 8 and older to request care online for non-urgent symptoms. For details visit [email or web address].   Also download the MyChart app! Go to the app store, search "MyChart", open the app, select [place], and log in with your MyChart username and password.

## 2022-10-02 DIAGNOSIS — L409 Psoriasis, unspecified: Secondary | ICD-10-CM | POA: Diagnosis not present

## 2022-10-02 DIAGNOSIS — R059 Cough, unspecified: Secondary | ICD-10-CM | POA: Diagnosis not present

## 2022-10-02 DIAGNOSIS — M791 Myalgia, unspecified site: Secondary | ICD-10-CM | POA: Diagnosis not present

## 2022-10-02 DIAGNOSIS — R0989 Other specified symptoms and signs involving the circulatory and respiratory systems: Secondary | ICD-10-CM | POA: Diagnosis not present

## 2022-10-02 DIAGNOSIS — Z20822 Contact with and (suspected) exposure to covid-19: Secondary | ICD-10-CM | POA: Diagnosis not present

## 2022-10-02 DIAGNOSIS — R5383 Other fatigue: Secondary | ICD-10-CM | POA: Diagnosis not present

## 2022-10-07 DIAGNOSIS — M25512 Pain in left shoulder: Secondary | ICD-10-CM | POA: Diagnosis not present

## 2022-10-07 DIAGNOSIS — Z79899 Other long term (current) drug therapy: Secondary | ICD-10-CM | POA: Diagnosis not present

## 2022-10-07 DIAGNOSIS — R0602 Shortness of breath: Secondary | ICD-10-CM | POA: Diagnosis not present

## 2022-10-07 DIAGNOSIS — I509 Heart failure, unspecified: Secondary | ICD-10-CM | POA: Diagnosis not present

## 2022-10-07 DIAGNOSIS — F17220 Nicotine dependence, chewing tobacco, uncomplicated: Secondary | ICD-10-CM | POA: Diagnosis not present

## 2022-10-07 DIAGNOSIS — I440 Atrioventricular block, first degree: Secondary | ICD-10-CM | POA: Diagnosis not present

## 2022-10-07 DIAGNOSIS — Z885 Allergy status to narcotic agent status: Secondary | ICD-10-CM | POA: Diagnosis not present

## 2022-10-07 DIAGNOSIS — R079 Chest pain, unspecified: Secondary | ICD-10-CM | POA: Diagnosis not present

## 2022-10-07 DIAGNOSIS — Z880 Allergy status to penicillin: Secondary | ICD-10-CM | POA: Diagnosis not present

## 2022-10-07 DIAGNOSIS — J449 Chronic obstructive pulmonary disease, unspecified: Secondary | ICD-10-CM | POA: Diagnosis not present

## 2022-10-16 DIAGNOSIS — M25561 Pain in right knee: Secondary | ICD-10-CM | POA: Diagnosis not present

## 2022-10-16 DIAGNOSIS — M25562 Pain in left knee: Secondary | ICD-10-CM | POA: Diagnosis not present

## 2022-10-16 DIAGNOSIS — M4626 Osteomyelitis of vertebra, lumbar region: Secondary | ICD-10-CM | POA: Diagnosis not present

## 2022-10-16 DIAGNOSIS — G894 Chronic pain syndrome: Secondary | ICD-10-CM | POA: Diagnosis not present

## 2022-10-16 DIAGNOSIS — M25579 Pain in unspecified ankle and joints of unspecified foot: Secondary | ICD-10-CM | POA: Diagnosis not present

## 2022-10-16 DIAGNOSIS — M549 Dorsalgia, unspecified: Secondary | ICD-10-CM | POA: Diagnosis not present

## 2022-10-16 DIAGNOSIS — Z79891 Long term (current) use of opiate analgesic: Secondary | ICD-10-CM | POA: Diagnosis not present

## 2022-10-18 DIAGNOSIS — R2681 Unsteadiness on feet: Secondary | ICD-10-CM | POA: Diagnosis not present

## 2022-10-24 ENCOUNTER — Other Ambulatory Visit (INDEPENDENT_AMBULATORY_CARE_PROVIDER_SITE_OTHER): Payer: Self-pay | Admitting: Gastroenterology

## 2022-10-24 DIAGNOSIS — K7030 Alcoholic cirrhosis of liver without ascites: Secondary | ICD-10-CM

## 2022-10-25 DIAGNOSIS — M109 Gout, unspecified: Secondary | ICD-10-CM | POA: Diagnosis not present

## 2022-10-25 DIAGNOSIS — Z23 Encounter for immunization: Secondary | ICD-10-CM | POA: Diagnosis not present

## 2022-10-26 ENCOUNTER — Inpatient Hospital Stay: Payer: 59 | Attending: Physician Assistant

## 2022-10-26 DIAGNOSIS — D61818 Other pancytopenia: Secondary | ICD-10-CM | POA: Diagnosis not present

## 2022-10-26 LAB — CBC WITH DIFFERENTIAL/PLATELET
Abs Immature Granulocytes: 0.01 10*3/uL (ref 0.00–0.07)
Basophils Absolute: 0 10*3/uL (ref 0.0–0.1)
Basophils Relative: 1 %
Eosinophils Absolute: 0.1 10*3/uL (ref 0.0–0.5)
Eosinophils Relative: 6 %
HCT: 39.6 % (ref 39.0–52.0)
Hemoglobin: 13 g/dL (ref 13.0–17.0)
Immature Granulocytes: 1 %
Lymphocytes Relative: 14 %
Lymphs Abs: 0.2 10*3/uL — ABNORMAL LOW (ref 0.7–4.0)
MCH: 30.4 pg (ref 26.0–34.0)
MCHC: 32.8 g/dL (ref 30.0–36.0)
MCV: 92.5 fL (ref 80.0–100.0)
Monocytes Absolute: 0.2 10*3/uL (ref 0.1–1.0)
Monocytes Relative: 9 %
Neutro Abs: 1.2 10*3/uL — ABNORMAL LOW (ref 1.7–7.7)
Neutrophils Relative %: 69 %
Platelets: 33 10*3/uL — ABNORMAL LOW (ref 150–400)
RBC: 4.28 MIL/uL (ref 4.22–5.81)
RDW: 13.6 % (ref 11.5–15.5)
WBC: 1.7 10*3/uL — ABNORMAL LOW (ref 4.0–10.5)
nRBC: 0 % (ref 0.0–0.2)

## 2022-11-02 ENCOUNTER — Encounter: Payer: Self-pay | Admitting: Hematology

## 2022-11-02 ENCOUNTER — Other Ambulatory Visit: Payer: Self-pay | Admitting: Cardiology

## 2022-11-02 DIAGNOSIS — E1165 Type 2 diabetes mellitus with hyperglycemia: Secondary | ICD-10-CM

## 2022-11-02 DIAGNOSIS — Z9189 Other specified personal risk factors, not elsewhere classified: Secondary | ICD-10-CM

## 2022-11-05 DIAGNOSIS — R079 Chest pain, unspecified: Secondary | ICD-10-CM | POA: Diagnosis not present

## 2022-11-05 DIAGNOSIS — I5032 Chronic diastolic (congestive) heart failure: Secondary | ICD-10-CM | POA: Diagnosis not present

## 2022-11-05 DIAGNOSIS — R21 Rash and other nonspecific skin eruption: Secondary | ICD-10-CM | POA: Diagnosis not present

## 2022-11-09 DIAGNOSIS — J449 Chronic obstructive pulmonary disease, unspecified: Secondary | ICD-10-CM | POA: Diagnosis not present

## 2022-11-09 DIAGNOSIS — G4733 Obstructive sleep apnea (adult) (pediatric): Secondary | ICD-10-CM | POA: Diagnosis not present

## 2022-11-20 DIAGNOSIS — Z79891 Long term (current) use of opiate analgesic: Secondary | ICD-10-CM | POA: Diagnosis not present

## 2022-11-20 DIAGNOSIS — M25561 Pain in right knee: Secondary | ICD-10-CM | POA: Diagnosis not present

## 2022-11-20 DIAGNOSIS — G894 Chronic pain syndrome: Secondary | ICD-10-CM | POA: Diagnosis not present

## 2022-11-20 DIAGNOSIS — M25562 Pain in left knee: Secondary | ICD-10-CM | POA: Diagnosis not present

## 2022-11-20 DIAGNOSIS — M4626 Osteomyelitis of vertebra, lumbar region: Secondary | ICD-10-CM | POA: Diagnosis not present

## 2022-11-20 DIAGNOSIS — M25579 Pain in unspecified ankle and joints of unspecified foot: Secondary | ICD-10-CM | POA: Diagnosis not present

## 2022-11-20 DIAGNOSIS — M549 Dorsalgia, unspecified: Secondary | ICD-10-CM | POA: Diagnosis not present

## 2022-11-21 DIAGNOSIS — Z515 Encounter for palliative care: Secondary | ICD-10-CM | POA: Diagnosis not present

## 2022-11-21 DIAGNOSIS — I509 Heart failure, unspecified: Secondary | ICD-10-CM | POA: Diagnosis not present

## 2022-11-23 DIAGNOSIS — M19071 Primary osteoarthritis, right ankle and foot: Secondary | ICD-10-CM | POA: Diagnosis not present

## 2022-11-23 DIAGNOSIS — M25571 Pain in right ankle and joints of right foot: Secondary | ICD-10-CM | POA: Diagnosis not present

## 2022-11-23 DIAGNOSIS — I38 Endocarditis, valve unspecified: Secondary | ICD-10-CM | POA: Diagnosis not present

## 2022-11-23 DIAGNOSIS — R61 Generalized hyperhidrosis: Secondary | ICD-10-CM | POA: Diagnosis not present

## 2022-11-23 DIAGNOSIS — G8929 Other chronic pain: Secondary | ICD-10-CM | POA: Diagnosis not present

## 2022-11-23 DIAGNOSIS — I5032 Chronic diastolic (congestive) heart failure: Secondary | ICD-10-CM | POA: Diagnosis not present

## 2022-11-26 ENCOUNTER — Inpatient Hospital Stay: Payer: 59 | Attending: Physician Assistant

## 2022-11-26 DIAGNOSIS — D61818 Other pancytopenia: Secondary | ICD-10-CM | POA: Diagnosis not present

## 2022-11-26 LAB — CBC WITH DIFFERENTIAL/PLATELET
Abs Immature Granulocytes: 0.01 10*3/uL (ref 0.00–0.07)
Basophils Absolute: 0 10*3/uL (ref 0.0–0.1)
Basophils Relative: 1 %
Eosinophils Absolute: 0.2 10*3/uL (ref 0.0–0.5)
Eosinophils Relative: 6 %
HCT: 44.4 % (ref 39.0–52.0)
Hemoglobin: 14.7 g/dL (ref 13.0–17.0)
Immature Granulocytes: 0 %
Lymphocytes Relative: 17 %
Lymphs Abs: 0.5 10*3/uL — ABNORMAL LOW (ref 0.7–4.0)
MCH: 30.1 pg (ref 26.0–34.0)
MCHC: 33.1 g/dL (ref 30.0–36.0)
MCV: 90.8 fL (ref 80.0–100.0)
Monocytes Absolute: 0.3 10*3/uL (ref 0.1–1.0)
Monocytes Relative: 9 %
Neutro Abs: 2 10*3/uL (ref 1.7–7.7)
Neutrophils Relative %: 67 %
Platelets: 45 10*3/uL — ABNORMAL LOW (ref 150–400)
RBC: 4.89 MIL/uL (ref 4.22–5.81)
RDW: 13.4 % (ref 11.5–15.5)
WBC: 3 10*3/uL — ABNORMAL LOW (ref 4.0–10.5)
nRBC: 0 % (ref 0.0–0.2)

## 2022-12-04 ENCOUNTER — Other Ambulatory Visit: Payer: Self-pay | Admitting: Pulmonary Disease

## 2022-12-10 ENCOUNTER — Ambulatory Visit (INDEPENDENT_AMBULATORY_CARE_PROVIDER_SITE_OTHER): Payer: 59 | Admitting: Gastroenterology

## 2022-12-10 DIAGNOSIS — R079 Chest pain, unspecified: Secondary | ICD-10-CM | POA: Diagnosis not present

## 2022-12-11 DIAGNOSIS — R079 Chest pain, unspecified: Secondary | ICD-10-CM | POA: Diagnosis not present

## 2022-12-25 DIAGNOSIS — G894 Chronic pain syndrome: Secondary | ICD-10-CM | POA: Diagnosis not present

## 2022-12-25 DIAGNOSIS — M25579 Pain in unspecified ankle and joints of unspecified foot: Secondary | ICD-10-CM | POA: Diagnosis not present

## 2022-12-25 DIAGNOSIS — M4626 Osteomyelitis of vertebra, lumbar region: Secondary | ICD-10-CM | POA: Diagnosis not present

## 2022-12-25 DIAGNOSIS — Z79891 Long term (current) use of opiate analgesic: Secondary | ICD-10-CM | POA: Diagnosis not present

## 2022-12-25 DIAGNOSIS — M549 Dorsalgia, unspecified: Secondary | ICD-10-CM | POA: Diagnosis not present

## 2022-12-25 DIAGNOSIS — M25562 Pain in left knee: Secondary | ICD-10-CM | POA: Diagnosis not present

## 2022-12-25 DIAGNOSIS — M25561 Pain in right knee: Secondary | ICD-10-CM | POA: Diagnosis not present

## 2022-12-26 ENCOUNTER — Inpatient Hospital Stay: Payer: 59

## 2022-12-27 ENCOUNTER — Inpatient Hospital Stay: Payer: 59 | Attending: Physician Assistant

## 2022-12-27 DIAGNOSIS — D61818 Other pancytopenia: Secondary | ICD-10-CM | POA: Diagnosis not present

## 2022-12-27 LAB — CBC WITH DIFFERENTIAL/PLATELET
Abs Immature Granulocytes: 0 10*3/uL (ref 0.00–0.07)
Basophils Absolute: 0 10*3/uL (ref 0.0–0.1)
Basophils Relative: 2 %
Eosinophils Absolute: 0.1 10*3/uL (ref 0.0–0.5)
Eosinophils Relative: 6 %
HCT: 41 % (ref 39.0–52.0)
Hemoglobin: 13.9 g/dL (ref 13.0–17.0)
Immature Granulocytes: 0 %
Lymphocytes Relative: 14 %
Lymphs Abs: 0.3 10*3/uL — ABNORMAL LOW (ref 0.7–4.0)
MCH: 30.4 pg (ref 26.0–34.0)
MCHC: 33.9 g/dL (ref 30.0–36.0)
MCV: 89.7 fL (ref 80.0–100.0)
Monocytes Absolute: 0.2 10*3/uL (ref 0.1–1.0)
Monocytes Relative: 9 %
Neutro Abs: 1.3 10*3/uL — ABNORMAL LOW (ref 1.7–7.7)
Neutrophils Relative %: 69 %
Platelets: 56 10*3/uL — ABNORMAL LOW (ref 150–400)
RBC: 4.57 MIL/uL (ref 4.22–5.81)
RDW: 13.1 % (ref 11.5–15.5)
WBC: 1.8 10*3/uL — ABNORMAL LOW (ref 4.0–10.5)
nRBC: 0 % (ref 0.0–0.2)

## 2023-01-03 ENCOUNTER — Ambulatory Visit: Payer: 59 | Admitting: Orthopaedic Surgery

## 2023-01-23 DIAGNOSIS — M25571 Pain in right ankle and joints of right foot: Secondary | ICD-10-CM | POA: Diagnosis not present

## 2023-01-23 DIAGNOSIS — I480 Paroxysmal atrial fibrillation: Secondary | ICD-10-CM | POA: Diagnosis not present

## 2023-01-23 DIAGNOSIS — I5032 Chronic diastolic (congestive) heart failure: Secondary | ICD-10-CM | POA: Diagnosis not present

## 2023-01-23 DIAGNOSIS — I2089 Other forms of angina pectoris: Secondary | ICD-10-CM | POA: Diagnosis not present

## 2023-01-23 DIAGNOSIS — M19071 Primary osteoarthritis, right ankle and foot: Secondary | ICD-10-CM | POA: Diagnosis not present

## 2023-01-23 DIAGNOSIS — I10 Essential (primary) hypertension: Secondary | ICD-10-CM | POA: Diagnosis not present

## 2023-01-23 DIAGNOSIS — E785 Hyperlipidemia, unspecified: Secondary | ICD-10-CM | POA: Diagnosis not present

## 2023-01-23 DIAGNOSIS — G8929 Other chronic pain: Secondary | ICD-10-CM | POA: Diagnosis not present

## 2023-01-28 ENCOUNTER — Inpatient Hospital Stay: Payer: 59

## 2023-01-30 ENCOUNTER — Inpatient Hospital Stay: Payer: 59 | Attending: Physician Assistant

## 2023-01-31 ENCOUNTER — Inpatient Hospital Stay: Payer: 59

## 2023-02-01 ENCOUNTER — Inpatient Hospital Stay: Payer: 59 | Attending: Physician Assistant

## 2023-02-01 DIAGNOSIS — D61818 Other pancytopenia: Secondary | ICD-10-CM | POA: Insufficient documentation

## 2023-02-01 DIAGNOSIS — F1010 Alcohol abuse, uncomplicated: Secondary | ICD-10-CM | POA: Diagnosis not present

## 2023-02-01 DIAGNOSIS — E538 Deficiency of other specified B group vitamins: Secondary | ICD-10-CM | POA: Diagnosis not present

## 2023-02-01 DIAGNOSIS — Z7901 Long term (current) use of anticoagulants: Secondary | ICD-10-CM | POA: Diagnosis not present

## 2023-02-01 DIAGNOSIS — D509 Iron deficiency anemia, unspecified: Secondary | ICD-10-CM | POA: Insufficient documentation

## 2023-02-01 DIAGNOSIS — D696 Thrombocytopenia, unspecified: Secondary | ICD-10-CM

## 2023-02-01 DIAGNOSIS — D500 Iron deficiency anemia secondary to blood loss (chronic): Secondary | ICD-10-CM

## 2023-02-01 DIAGNOSIS — Z79899 Other long term (current) drug therapy: Secondary | ICD-10-CM | POA: Diagnosis not present

## 2023-02-01 DIAGNOSIS — D708 Other neutropenia: Secondary | ICD-10-CM

## 2023-02-01 DIAGNOSIS — K7460 Unspecified cirrhosis of liver: Secondary | ICD-10-CM | POA: Insufficient documentation

## 2023-02-01 LAB — COMPREHENSIVE METABOLIC PANEL
ALT: 10 U/L (ref 0–44)
AST: 22 U/L (ref 15–41)
Albumin: 2.8 g/dL — ABNORMAL LOW (ref 3.5–5.0)
Alkaline Phosphatase: 65 U/L (ref 38–126)
Anion gap: 9 (ref 5–15)
BUN: 10 mg/dL (ref 6–20)
CO2: 25 mmol/L (ref 22–32)
Calcium: 8.3 mg/dL — ABNORMAL LOW (ref 8.9–10.3)
Chloride: 103 mmol/L (ref 98–111)
Creatinine, Ser: 0.63 mg/dL (ref 0.61–1.24)
GFR, Estimated: >60 mL/min (ref >60)
Glucose, Bld: 137 mg/dL — ABNORMAL HIGH (ref 70–99)
Potassium: 3.8 mmol/L (ref 3.5–5.1)
Sodium: 137 mmol/L (ref 135–145)
Total Bilirubin: 1 mg/dL (ref 0.0–1.2)
Total Protein: 5.7 g/dL — ABNORMAL LOW (ref 6.5–8.1)

## 2023-02-01 LAB — CBC WITH DIFFERENTIAL/PLATELET
Abs Immature Granulocytes: 0 10*3/uL (ref 0.00–0.07)
Basophils Absolute: 0 10*3/uL (ref 0.0–0.1)
Basophils Relative: 2 %
Eosinophils Absolute: 0.2 10*3/uL (ref 0.0–0.5)
Eosinophils Relative: 8 %
HCT: 38.1 % — ABNORMAL LOW (ref 39.0–52.0)
Hemoglobin: 12.7 g/dL — ABNORMAL LOW (ref 13.0–17.0)
Immature Granulocytes: 0 %
Lymphocytes Relative: 23 %
Lymphs Abs: 0.4 10*3/uL — ABNORMAL LOW (ref 0.7–4.0)
MCH: 30.4 pg (ref 26.0–34.0)
MCHC: 33.3 g/dL (ref 30.0–36.0)
MCV: 91.1 fL (ref 80.0–100.0)
Monocytes Absolute: 0.2 10*3/uL (ref 0.1–1.0)
Monocytes Relative: 8 %
Neutro Abs: 1.1 10*3/uL — ABNORMAL LOW (ref 1.7–7.7)
Neutrophils Relative %: 59 %
Platelets: 43 10*3/uL — ABNORMAL LOW (ref 150–400)
RBC: 4.18 MIL/uL — ABNORMAL LOW (ref 4.22–5.81)
RDW: 14.2 % (ref 11.5–15.5)
WBC: 1.9 10*3/uL — ABNORMAL LOW (ref 4.0–10.5)
nRBC: 0 % (ref 0.0–0.2)

## 2023-02-01 LAB — IRON AND TIBC
Iron: 47 ug/dL (ref 45–182)
Saturation Ratios: 21 % (ref 17.9–39.5)
TIBC: 226 ug/dL — ABNORMAL LOW (ref 250–450)
UIBC: 179 ug/dL

## 2023-02-01 LAB — FERRITIN: Ferritin: 132 ng/mL (ref 24–336)

## 2023-02-02 NOTE — Progress Notes (Unsigned)
[facility] [address], [place] [address]   CLINIC:  Medical Oncology/Hematology  PCP:  Beatrix Fetters, MD [address] [place] [facility] [place] [address] [phone]   REASON FOR VISIT: Follow-up for pancytopenia   CURRENT THERAPY: Close surveillance, intermittent IV iron (last Feraheme on 10/01/2022)  INTERVAL HISTORY:   [name] 58 y.o. male returns for routine follow-up of leukopenia/thrombocytopenia (secondary to cirrhosis and splenomegaly) and iron deficiency anemia (secondary to malabsorption in the setting of alcohol abuse and cirrhosis, possible slow chronic blood loss from varices and portal hypertensive gastropathy).  He was last evaluated via telemedicine visit by Rojelio Brenner PA-C on 09/26/2022.   At today's visit, he reports feeling fair.  He remains mildly fatigued.  He continues to take daily iron, daily folate, and vitamin B12 500 mcg 3 times weekly.  He admits that he has started drinking alcohol again.  He reports that he was drinking "heavily" in October/November 2024, reportedly drinking up to 6 beers and a pint of liquor daily.  He has cut back for the past month, and is drinking 16 to 32 ounces of beer daily.     He reports easy bruising but denies any petechial rash.  He bleeds easily when cut, but is able to stop bleeding within a reasonable time.  He reports dark bowel movements that he attributes to iron; he was Hemoccult negative when previously checked at [facility].  He denies any hematochezia, hematuria, epistaxis, hemoptysis, hematemesis, or gum bleeding.   He has not had any infections since his spinal osteomyelitis in October 2022. He denies any B symptoms such as fever, chills, night sweats, unintentional weight loss.  He has stable shortness of breath on exertion but denies any recent chest pain, presyncopal episodes, or palpitations. No pica.  He has occasional chest discomfort associated with panic attacks.  He has 70% energy  and 100% appetite. He reports fluctuating weight, but is trying to become more active.  He is able to ambulate about 10 feet now without crutches, although this does cause some dyspnea on exertion.  ASSESSMENT & PLAN:  1.   Pancytopenia, secondary to cirrhosis of the liver and splenomegaly - Secondary to cirrhosis and splenomegaly. - Status post Bone Marrow Biopsy on 10/24/2015 that demonstrated normocellular bone marrow with trilineage hematopoiesis and normal cytogenetics - Abdominal ultrasound performed on 03/02/2020 showed liver cirrhosis and marked splenomegaly, with spleen measuring 20.6 cm in length with a calculated volume 2334 mL - Nutritional panel from 06/21/2020 showed normal B12, normal folate; SPEP normal - Patient had platelet transfusion on 05/27/2020 prior to his EGD/colonoscopy - Most recent labs (02/01/2023): WBC 1.9/ANC 1.1/ALC 0.4.  Platelets 43.  Hgb 12.7.  CMP at baseline.  (Previous immature platelet fraction and LDH was normal.) - Positive for easy bruising without petechial rash.  No major bleeding events.  - Stools are dark from iron.  Hemoccult negative when checked at [facility]. - No B symptoms or frequent infections.   - DISCUSSION: Do not recommend partial splenic embolization (PSE) at this time, based on the following: Although chronic liver disease is associated with thrombocytopenia, severe thrombocytopenia (platelets <30-40 K) is rare. PSE has high risk of complications, and improvement in platelet count does not appear to be longstanding. PSE would only affect one aspect of multifactorial causes of thrombocytopenia in cirrhotic patients, and would not resolve underlying portal hypertension that is felt to be the driving risk factor of bleeding in the majority of cirrhotic patients.  Overall, patients with liver cirrhosis have rebalanced hemostasis due to reduced synthesis of procoagulants as well as reduced synthesis of anticoagulants, increased production of VWF,  thrombocytopenia, and impaired platelet function.  Although the net effect of these changes is rebalanced hemostasis, hemostatic equilibrium is unstable and can be altered either toward bleeding or thrombosis depending on clinical conditions.  (Patient could be tipped more toward hemorrhage if triggered by progressive portal hypertension, renal failure, or infection.) PSE could be considered in cases of splenic vein thrombosis or if the patient developed bleeding gastric varices. Long-term use of TPO receptor agonists is not currently recommended due to increased risk of portal vein thrombus.  If necessary, could be given preoperatively if platelets <50 K.  - PLAN: Platelets remain low but at usual baseline, without any evidence of active bleeding.  He may also have some increased platelet consumption in the setting of known portal vein thrombus.   - We will continue monitor closely with repeat labs and RTC in 4 months   2. Portal vein thrombus - Abdominal ultrasound performed 03/02/2020 continues to show chronic portal vein thrombus described as a stable nonocclusive intraluminal thrombus within the main portal vein - History of GI bleeding while on warfarin for portal vein thrombus - Not a good candidate for anticoagulation due to thrombocytopenia, history of GI bleeding, and ongoing bleeding risk   3.  Iron deficiency anemia - Likely secondary to malabsorption of nutrients in the setting of alcohol use and cirrhosis, may also have some slow chronic blood loss  - Unable to tolerate oral iron - Has required intermittent IV infusions, with last IV Venofer given in March/April 2022 - Recent EGD (07/31/2022): Grade 1 esophageal varices.  Moderate portal hypertensive gastropathy.  (Previously required banding for varices and had findings consistent with gastric antral vascular ectasia) - Recent colonoscopy (05/27/2020): two nonbleeding colonic angiodysplastic lesions; nonbleeding internal hemorrhoids;  diverticulosis in sigmoid and descending colon - He is taking iron tablet daily.   - He denies any gross rectal bleeding.  Stool is dark from iron, but Hemoccult negative when checked at [facility]. - Most recent labs (02/01/2023): Hgb 12.7/MCV 91.1.  Ferritin 132, iron saturation 21% with low TIBC 226. - PLAN: No indication for IV iron at this time. - Continue oral iron supplementation.   - We will repeat labs and RTC in 4 months.    4.  Vitamin B12 and folic acid deficiency - Labs from 10/09/2021 show marginal B12 288 with normal MMA. - He is taking vitamin B12 500 mcg on MWF and folic acid 400 mcg daily - Most recent labs (09/21/2022): Normal B12 at 615, normal MMA.  Normal folate. - PLAN: Continue vitamin B12 500 mcg 3 times weekly and folic acid daily.  Recheck B12/MMA at next visit.     5. Liver cirrhosis - Follows with GI for hepatologic management - Receiving abdominal ultrasounds via GI every 6 months to screen for [facility], given his high risk status of developing hepatocellular carcinoma - PLAN: Continue follow-up with Dr. Levon Hedger and NP Doylene Bode  6.  Alcohol abuse - He admits that he has started drinking alcohol again.  He reports that he was drinking "heavily" in October/November 2024, reportedly drinking up to 6 beers and a pint of liquor daily.  He has cut back for the past month, and is drinking 16 to 32 ounces of beer daily. - He states that he "just likes the taste."   - PLAN: Discussed importance of STRICT abstinence  from alcohol. - Discussed nonalcoholic beer as an alternative, but patient is concerned due to increased cost of nonalcoholic beer.  Discussed that 1 nonalcoholic beer daily would cost him less in the end then 2 or more regular beers daily.   PLAN SUMMARY: >> Labs in 4 months = CMP, CBC/D, ferritin, iron/TIBC, B12, MMA >> PHONE visit in 4 months (1 week after labs)   ** Last office visit 02/04/2023      REVIEW OF SYSTEMS:   Review of Systems   Constitutional:  Positive for fatigue. Negative for appetite change, chills, diaphoresis, fever and unexpected weight change.  HENT:   Negative for lump/mass and nosebleeds.   Eyes:  Negative for eye problems.  Respiratory:  Positive for shortness of breath (with exertion). Negative for cough and hemoptysis.   Cardiovascular:  Negative for chest pain, leg swelling and palpitations.  Gastrointestinal:  Negative for abdominal pain, blood in stool, constipation, diarrhea, nausea and vomiting.  Genitourinary:  Negative for hematuria.   Skin: Negative.   Neurological:  Positive for headaches and numbness. Negative for dizziness and light-headedness.  Hematological:  Does not bruise/bleed easily.     PHYSICAL EXAM:  ECOG PERFORMANCE STATUS: 3 - Symptomatic, >50% confined to bed  Vitals:   02/04/23 1347  BP: 134/86  Pulse: (!) 58  Resp: 16  Temp: (!) 97.5 F (36.4 C)  SpO2: 98%   There were no vitals filed for this visit. Physical Exam Constitutional:      Appearance: Normal appearance. He is morbidly obese.  Cardiovascular:     Heart sounds: Normal heart sounds.  Pulmonary:     Breath sounds: Normal breath sounds.  Neurological:     General: No focal deficit present.     Mental Status: Mental status is at baseline.  Psychiatric:        Behavior: Behavior normal. Behavior is cooperative.     PAST MEDICAL/SURGICAL HISTORY:  Past Medical History:  Diagnosis Date   Anxiety    Atrial fibrillation (HCC)    Blood clot in vein    blood clot in portal vein   CHF (congestive heart failure) (HCC)    Cirrhosis (HCC)    NASH   Constipation    COPD (chronic obstructive pulmonary disease) (HCC)    Dysrhythmia    GERD (gastroesophageal reflux disease)    Gout    Leukopenia 07/08/2015   Neuromuscular disorder (HCC)    neuropathy in hands and feet   Psoriasis    RA (rheumatoid arthritis) (HCC)    Sleep apnea    cpap used- level 10 and greater   Thrombocytopenia (HCC)    Past  Surgical History:  Procedure Laterality Date   ANKLE SURGERY     right ankle talor repair   CHOLECYSTECTOMY  sept 2016   CHOLECYSTECTOMY     COLONOSCOPY  05/08/2011   Procedure: COLONOSCOPY;  Surgeon: Malissa Hippo, MD;  [facility];  Service: Endoscopy;  Laterality: N/A;  730   COLONOSCOPY WITH PROPOFOL N/A 05/27/2020   Procedure: COLONOSCOPY WITH PROPOFOL;  Surgeon: Dolores Frame, MD;  Location: [facility];  Service: Gastroenterology;  Laterality: N/A;  Patient needs a unit of platelets prior to procedure.   ESOPHAGEAL BANDING N/A 05/27/2020   Procedure: ESOPHAGEAL BANDING;  Surgeon: Dolores Frame, MD;  [facility]: [facility];  Service: Gastroenterology;  Laterality: N/A;   ESOPHAGOGASTRODUODENOSCOPY  02/28/2011   Procedure: ESOPHAGOGASTRODUODENOSCOPY (EGD);  Surgeon: Malissa Hippo, MD;  [facility];  Service: Endoscopy;  Laterality: N/A;  1200   ESOPHAGOGASTRODUODENOSCOPY N/A 06/11/2012   Procedure: ESOPHAGOGASTRODUODENOSCOPY (EGD);  Surgeon: Malissa Hippo, MD;  [facility];  Service: Endoscopy;  Laterality: N/A;  1200  FYI patient is 400 pounds   ESOPHAGOGASTRODUODENOSCOPY (EGD) WITH PROPOFOL N/A 04/21/2014   Procedure: ESOPHAGOGASTRODUODENOSCOPY (EGD) WITH PROPOFOL;  Surgeon: Malissa Hippo, MD;  Location: [facility];  Service: Endoscopy;  Laterality: N/A;   ESOPHAGOGASTRODUODENOSCOPY (EGD) WITH PROPOFOL N/A 05/27/2020   Procedure: ESOPHAGOGASTRODUODENOSCOPY (EGD) WITH PROPOFOL;  Surgeon: Dolores Frame, MD;  [facility];  Service: Gastroenterology;  Laterality: N/A;  7:30 am   ESOPHAGOGASTRODUODENOSCOPY (EGD) WITH PROPOFOL N/A 07/29/2020   Procedure: ESOPHAGOGASTRODUODENOSCOPY (EGD) WITH PROPOFOL;  Surgeon: Dolores Frame, MD;  [facility];  Service: Gastroenterology;  Laterality: N/A;  8:20   ESOPHAGOGASTRODUODENOSCOPY (EGD) WITH PROPOFOL N/A 07/31/2022   Procedure:  ESOPHAGOGASTRODUODENOSCOPY (EGD) WITH PROPOFOL;  Surgeon: Dolores Frame, MD;  [facility];  Service: Gastroenterology;  Laterality: N/A;  9:30am;asa 3   HERNIA REPAIR Right    as child; inguinal   HERNIA REPAIR  sept 2016   LIVER BIOPSY  2012   SCIATIC NERVE EXPLORATION     TONSILLECTOMY      SOCIAL HISTORY:  Social History   Socioeconomic History   Marital status: Divorced    Spouse name: Not on file   Number of children: 1   Years of education: Not on file   Highest education level: Some college, no degree  Occupational History   Occupation: Disabled  Tobacco Use   Smoking status: Never   Smokeless tobacco: Current    Types: Snuff, Chew   Tobacco comments:    Pt reports that he "dips"  Vaping Use   Vaping status: Never Used  Substance and Sexual Activity   Alcohol use: Yes    Alcohol/week: 4.0 standard drinks of alcohol    Types: 4 Shots of liquor per week    Comment: occasionally   Drug use: No    Comment: Use to smoke cocaine and marijuana. No IV drug use   Sexual activity: Yes  Other Topics Concern   Not on file  Social History Narrative   Not on file   Social Drivers of Health   Financial Resource Strain: Low Risk  (06/21/2022)   Received from [facility], [facility]   Overall Financial Resource Strain (CARDIA)    Difficulty of Paying Living Expenses: Not very hard  Food Insecurity: No Food Insecurity (06/21/2022)   Received from [facility], [facility]   Hunger Vital Sign    Worried About Running Out of Food in the Last Year: Never true    Ran Out of Food in the Last Year: Never true  Transportation Needs: No Transportation Needs (10/25/2022)   Received from [facility] - Transportation    Lack of Transportation (Medical): No    Lack of Transportation (Non-Medical): No  Physical Activity: Insufficiently Active (10/25/2022)   Received from [facility]   Exercise Vital Sign    Days of Exercise  per Week: 5 days    Minutes of Exercise per Session: 20 min  Stress: No Stress Concern Present (10/25/2022)   Received from [facility] - Occupational Stress Questionnaire    Feeling of Stress : Only a little  Social Connections: Socially Isolated (10/25/2022)   Received from [facility]  Social Connection and Isolation Panel [NHANES]    Frequency of Communication with Friends and Family: Three times a week    Frequency of Social Gatherings with Friends and Family: Three times a week    Attends Religious Services: Never    Active Member of Clubs or Organizations: No    Attends Banker Meetings: Never    Marital Status: Divorced  Catering manager Violence: Not At Risk (10/25/2022)   Received from [facility]   Humiliation, Afraid, Rape, and Kick questionnaire    Fear of Current or Ex-Partner: No    Emotionally Abused: No    Physically Abused: No    Sexually Abused: No    FAMILY HISTORY:  Family History  Problem Relation Age of Onset   Colon cancer Mother    Cancer Mother        intestine   Cancer Father        throat   Cancer Brother        brain   Cancer Sister    Heart attack Neg Hx    Stroke Neg Hx     CURRENT MEDICATIONS:  Outpatient Encounter Medications as of 02/04/2023  Medication Sig Note   calcipotriene (DOVONOX) 0.005 % ointment APPLY TO THE AFFECTED AREA(S) TOPICALLY TWICE DAILY    diltiazem (CARDIZEM) 90 MG tablet Take 1 capsule by mouth daily.    albuterol (VENTOLIN HFA) 108 (90 Base) MCG/ACT inhaler INHALE 2 PUFFS BY MOUTH EVERY 6 HOURS AS NEEDED FOR SHORTNESS OF BREATH OR WHEEZING    aspirin EC 325 MG tablet Take 162.5 mg by mouth every 6 (six) hours as needed (chest pain).    baclofen (LIORESAL) 10 MG tablet TAKE 1 TABLET BY MOUTH THREE TIMES A DAY (Patient taking differently: Take 10 mg by mouth 3 (three) times daily as needed for muscle spasms.)    betamethasone dipropionate 0.05 %  cream APPLY TO AFFECTED AREA TWICE A DAY (Patient taking differently: Apply 1 application  topically daily as needed (itching).)    buprenorphine (SUBUTEX) 8 MG SUBL SL tablet Place 8 mg under the tongue every 8 (eight) hours.    busPIRone (BUSPAR) 10 MG tablet TAKE 1 TABLET BY MOUTH THREE TIMES A DAY (Patient taking differently: Take 10 mg by mouth 3 (three) times daily as needed (panic attacks).)    busPIRone (BUSPAR) 10 MG tablet Take 1 tablet by mouth 3 (three) times daily.    carvedilol (COREG) 12.5 MG tablet Take 1 tablet (12.5 mg total) by mouth 2 (two) times daily with a meal.    cyanocobalamin (V-R VITAMIN B-12) 500 MCG tablet Take 1 tablet (500 mcg total) by mouth every Monday, Wednesday, and Friday.    doxepin (SINEQUAN) 10 MG capsule Take 10 mg by mouth at bedtime.    DULoxetine (CYMBALTA) 60 MG capsule Take 60 mg by mouth daily.    ezetimibe (ZETIA) 10 MG tablet Take 1 tablet (10 mg total) by mouth daily.    ferrous sulfate 325 (65 FE) MG EC tablet Take 1 tablet (325 mg total) by mouth daily with breakfast.    fluticasone (FLONASE) 50 MCG/ACT nasal spray USE 2 SPRAYS IN EACH NOSTRIL EVERY DAY AS NEEDED (Patient taking differently: Place 2 sprays into both nostrils daily as needed for allergies.)    folic acid (FOLVITE) 800 MCG tablet Take 1 tablet (800 mcg total) by mouth daily.    furosemide (LASIX) 20 MG tablet Take 20 mg by mouth daily.    gabapentin (NEURONTIN)  800 MG tablet Take 1 tablet (800 mg total) by mouth 3 (three) times daily.    ipratropium (ATROVENT) 0.02 % nebulizer solution Take 2.5 mLs (0.5 mg total) by nebulization every 6 (six) hours as needed for wheezing or shortness of breath.    lactulose (CONSTULOSE) 10 GM/15ML solution TAKE 30 ML BY MOUTH TWICE DAILY    lidocaine (XYLOCAINE) 5 % ointment Apply 1 application topically as needed.    Milk Thistle 1000 MG CAPS Take 1,000 mg by mouth in the morning, at noon, and at bedtime. 06/05/2022: When he can afford this he takes  it.    Multiple Vitamins-Minerals (MULTI COMPLETE PO) Take 1 tablet by mouth daily.  07/30/2022: Not currently taking    OneTouch Delica Lancets 30G MISC Test BS QID Dx E11.9    oxyCODONE (OXY IR/ROXICODONE) 5 MG immediate release tablet Take 5 mg by mouth every 6 (six) hours as needed for severe pain.    pantoprazole (PROTONIX) 40 MG tablet Take 1 tablet (40 mg total) by mouth 2 (two) times daily.    pravastatin (PRAVACHOL) 10 MG tablet TAKE 1 TABLET BY MOUTH DAILY    sildenafil (REVATIO) 20 MG tablet Take 2-5 tablets by mouth 30-60 minutes before intercourse    spironolactone (ALDACTONE) 50 MG tablet Take 1 tablet (50 mg total) by mouth daily.    [DISCONTINUED] cyanocobalamin (V-R VITAMIN B-12) 500 MCG tablet Take 1 tablet (500 mcg total) by mouth every Monday, Wednesday, and Friday.    [DISCONTINUED] ferrous sulfate 325 (65 FE) MG EC tablet Take 1 tablet (325 mg total) by mouth daily with breakfast.    [DISCONTINUED] folic acid (FOLVITE) 800 MCG tablet Take 800 mcg by mouth daily.    No facility-administered encounter medications on file as of 02/04/2023.    ALLERGIES:  Allergies  Allergen Reactions   Allopurinol Other (See Comments)    Joint pain worse     LABORATORY DATA:  I have reviewed the labs as listed.  CBC    Component Value Date/Time   WBC 1.9 (L) 02/01/2023 1246   RBC 4.18 (L) 02/01/2023 1246   HGB 12.7 (L) 02/01/2023 1246   HGB 11.4 (L) 02/22/2020 1138   HCT 38.1 (L) 02/01/2023 1246   HCT 33.9 (L) 02/22/2020 1138   PLT 43 (L) 02/01/2023 1246   PLT 38 (LL) 02/22/2020 1138   MCV 91.1 02/01/2023 1246   MCV 79 02/22/2020 1138   MCH 30.4 02/01/2023 1246   MCHC 33.3 02/01/2023 1246   RDW 14.2 02/01/2023 1246   RDW 14.0 02/22/2020 1138   LYMPHSABS 0.4 (L) 02/01/2023 1246   LYMPHSABS 0.4 (L) 02/22/2020 1138   MONOABS 0.2 02/01/2023 1246   EOSABS 0.2 02/01/2023 1246   EOSABS 0.1 02/22/2020 1138   BASOSABS 0.0 02/01/2023 1246   BASOSABS 0.0 02/22/2020 1138       Latest Ref Rng & Units 02/01/2023   12:46 PM 09/21/2022    1:54 PM 07/27/2022   10:28 AM  CMP  Glucose 70 - 99 mg/dL 161  096  045   BUN 6 - 20 mg/dL 10  6  9    Creatinine 0.61 - 1.24 mg/dL 4.09  8.11  9.14   Sodium 135 - 145 mmol/L 137  136  138   Potassium 3.5 - 5.1 mmol/L 3.8  3.9  4.3   Chloride 98 - 111 mmol/L 103  105  105   CO2 22 - 32 mmol/L 25  23  25    Calcium 8.9 -  10.3 mg/dL 8.3  7.9  8.9   Total Protein 6.5 - 8.1 g/dL 5.7  6.2  6.8   Total Bilirubin 0.0 - 1.2 mg/dL 1.0  0.7  1.0   Alkaline Phos 38 - 126 U/L 65  98  93   AST 15 - 41 U/L 22  35  41   ALT 0 - 44 U/L 10  16  23      DIAGNOSTIC IMAGING:  I have independently reviewed the relevant imaging and discussed with the patient.   WRAP UP:  All questions were answered. The patient knows to call the clinic with any problems, questions or concerns.  Medical decision making: Moderate  Time spent on visit: I spent 20 minutes counseling the patient face to face. The total time spent in the appointment was 30 minutes and more than 50% was on counseling.  Carnella Guadalajara, PA-C  02/04/23 2:49 PM

## 2023-02-03 ENCOUNTER — Other Ambulatory Visit: Payer: Self-pay | Admitting: Cardiology

## 2023-02-03 DIAGNOSIS — Z9189 Other specified personal risk factors, not elsewhere classified: Secondary | ICD-10-CM

## 2023-02-03 DIAGNOSIS — E1165 Type 2 diabetes mellitus with hyperglycemia: Secondary | ICD-10-CM

## 2023-02-04 ENCOUNTER — Inpatient Hospital Stay (HOSPITAL_BASED_OUTPATIENT_CLINIC_OR_DEPARTMENT_OTHER): Payer: 59 | Admitting: Physician Assistant

## 2023-02-04 DIAGNOSIS — D696 Thrombocytopenia, unspecified: Secondary | ICD-10-CM

## 2023-02-04 DIAGNOSIS — D500 Iron deficiency anemia secondary to blood loss (chronic): Secondary | ICD-10-CM | POA: Diagnosis not present

## 2023-02-04 DIAGNOSIS — D61818 Other pancytopenia: Secondary | ICD-10-CM | POA: Diagnosis not present

## 2023-02-04 DIAGNOSIS — D708 Other neutropenia: Secondary | ICD-10-CM

## 2023-02-04 DIAGNOSIS — Z7901 Long term (current) use of anticoagulants: Secondary | ICD-10-CM | POA: Diagnosis not present

## 2023-02-04 DIAGNOSIS — E538 Deficiency of other specified B group vitamins: Secondary | ICD-10-CM

## 2023-02-04 DIAGNOSIS — K7460 Unspecified cirrhosis of liver: Secondary | ICD-10-CM | POA: Diagnosis not present

## 2023-02-04 DIAGNOSIS — Z79899 Other long term (current) drug therapy: Secondary | ICD-10-CM | POA: Diagnosis not present

## 2023-02-04 DIAGNOSIS — D509 Iron deficiency anemia, unspecified: Secondary | ICD-10-CM | POA: Diagnosis not present

## 2023-02-04 MED ORDER — CYANOCOBALAMIN 500 MCG PO TABS: 500.0000 ug | ORAL_TABLET | ORAL | 3 refills | Status: DC

## 2023-02-04 MED ORDER — FOLIC ACID 800 MCG PO TABS: 800.0000 ug | ORAL_TABLET | Freq: Every day | ORAL | 11 refills | Status: DC

## 2023-02-04 MED ORDER — FERROUS SULFATE 325 (65 FE) MG PO TBEC: 325.0000 mg | DELAYED_RELEASE_TABLET | Freq: Every day | ORAL | 3 refills | Status: DC

## 2023-02-04 NOTE — Patient Instructions (Signed)
[facility] at [facility] Discharge Instructions  You were seen today by Rojelio Brenner PA-C for your low blood cells, which are related to your underlying liver cirrhosis.    You do not need any specific treatment at this time, but we will continue to monitor your low platelets and low white blood cells.  (Low white blood cells and low platelets are caused by liver cirrhosis.)  Your hemoglobin/red blood cells are currently normal.  You should continue to take daily iron supplement, but do not need any IV iron at this time.  Your vitamin B12 levels look better!  Continue taking vitamin B12 500 mcg 3 times weekly.  Continue taking folic acid supplement daily.  We will see you for repeat labs and follow-up visit (via telephone) in 4 months.  It is EXTREMELY IMPORTANT that you also continue to see your GI doctor for management of your liver cirrhosis.  ** Thank you for trusting me with your healthcare!  I strive to provide all of my patients with quality care at each visit.  If you receive a survey for this visit, I would be so grateful to you for taking the time to provide feedback.  Thank you in advance!  ~ Janisha Bueso                   Dr. Doreatha Massed   &   Rojelio Brenner, PA-C   - - - - - - - - - - - - - - - - - -     Thank you for choosing [facility] at [facility] to provide your oncology and hematology care.  To afford each patient quality time with our provider, please arrive at least 15 minutes before your scheduled appointment time.   If you have a lab appointment with the Cancer Center please come in thru the Main Entrance and check in at the main information desk.  You need to re-schedule your appointment should you arrive 10 or more minutes late.  We strive to give you quality time with our providers, and arriving late affects you and other patients whose appointments are after yours.  Also, if you no show three or more  times for appointments you may be dismissed from the clinic at the providers discretion.     Again, thank you for choosing [facility].  Our hope is that these requests will decrease the amount of time that you wait before being seen by our physicians.       _____________________________________________________________  Should you have questions after your visit to [facility], please contact our office at [phone] and follow the prompts.  Our office hours are 8:00 a.m. and 4:30 p.m. Monday - Friday.  Please note that voicemails left after 4:00 p.m. may not be returned until the following business day.  We are closed weekends and major holidays.  You do have access to a nurse 24-7, just call the main number to the clinic [phone] and do not press any options, hold on the line and a nurse will answer the phone.    For prescription refill requests, have your pharmacy contact our office and allow 72 hours.    Due to Covid, you will need to wear a mask upon entering the hospital. If you do not have a mask, a mask will be given to you at the Main Entrance upon arrival. For doctor visits, patients may have 1 support  person age 53 or older with them. For treatment visits, patients can not have anyone with them due to social distancing guidelines and our immunocompromised population.

## 2023-02-05 DIAGNOSIS — M25561 Pain in right knee: Secondary | ICD-10-CM | POA: Diagnosis not present

## 2023-02-05 DIAGNOSIS — R5383 Other fatigue: Secondary | ICD-10-CM | POA: Diagnosis not present

## 2023-02-05 DIAGNOSIS — M4626 Osteomyelitis of vertebra, lumbar region: Secondary | ICD-10-CM | POA: Diagnosis not present

## 2023-02-05 DIAGNOSIS — M109 Gout, unspecified: Secondary | ICD-10-CM | POA: Diagnosis not present

## 2023-02-05 DIAGNOSIS — M549 Dorsalgia, unspecified: Secondary | ICD-10-CM | POA: Diagnosis not present

## 2023-02-05 DIAGNOSIS — M25571 Pain in right ankle and joints of right foot: Secondary | ICD-10-CM | POA: Diagnosis not present

## 2023-02-05 DIAGNOSIS — M25562 Pain in left knee: Secondary | ICD-10-CM | POA: Diagnosis not present

## 2023-02-05 DIAGNOSIS — R7989 Other specified abnormal findings of blood chemistry: Secondary | ICD-10-CM | POA: Diagnosis not present

## 2023-02-05 DIAGNOSIS — Z79891 Long term (current) use of opiate analgesic: Secondary | ICD-10-CM | POA: Diagnosis not present

## 2023-02-05 DIAGNOSIS — M25579 Pain in unspecified ankle and joints of unspecified foot: Secondary | ICD-10-CM | POA: Diagnosis not present

## 2023-02-05 DIAGNOSIS — M79673 Pain in unspecified foot: Secondary | ICD-10-CM | POA: Diagnosis not present

## 2023-02-05 DIAGNOSIS — G894 Chronic pain syndrome: Secondary | ICD-10-CM | POA: Diagnosis not present

## 2023-02-05 DIAGNOSIS — G8929 Other chronic pain: Secondary | ICD-10-CM | POA: Diagnosis not present

## 2023-02-08 DIAGNOSIS — J449 Chronic obstructive pulmonary disease, unspecified: Secondary | ICD-10-CM | POA: Diagnosis not present

## 2023-02-08 DIAGNOSIS — G4733 Obstructive sleep apnea (adult) (pediatric): Secondary | ICD-10-CM | POA: Diagnosis not present

## 2023-02-13 ENCOUNTER — Encounter: Payer: Self-pay | Admitting: Hematology

## 2023-02-26 DIAGNOSIS — M4626 Osteomyelitis of vertebra, lumbar region: Secondary | ICD-10-CM | POA: Diagnosis not present

## 2023-02-26 DIAGNOSIS — M25561 Pain in right knee: Secondary | ICD-10-CM | POA: Diagnosis not present

## 2023-02-26 DIAGNOSIS — M25579 Pain in unspecified ankle and joints of unspecified foot: Secondary | ICD-10-CM | POA: Diagnosis not present

## 2023-02-26 DIAGNOSIS — Z79891 Long term (current) use of opiate analgesic: Secondary | ICD-10-CM | POA: Diagnosis not present

## 2023-02-26 DIAGNOSIS — M549 Dorsalgia, unspecified: Secondary | ICD-10-CM | POA: Diagnosis not present

## 2023-02-26 DIAGNOSIS — G894 Chronic pain syndrome: Secondary | ICD-10-CM | POA: Diagnosis not present

## 2023-02-26 DIAGNOSIS — M25562 Pain in left knee: Secondary | ICD-10-CM | POA: Diagnosis not present

## 2023-03-13 ENCOUNTER — Ambulatory Visit: Payer: 59 | Admitting: Podiatry

## 2023-03-26 DIAGNOSIS — G4733 Obstructive sleep apnea (adult) (pediatric): Secondary | ICD-10-CM | POA: Diagnosis not present

## 2023-03-26 DIAGNOSIS — M25562 Pain in left knee: Secondary | ICD-10-CM | POA: Diagnosis not present

## 2023-03-26 DIAGNOSIS — Z79891 Long term (current) use of opiate analgesic: Secondary | ICD-10-CM | POA: Diagnosis not present

## 2023-03-26 DIAGNOSIS — J449 Chronic obstructive pulmonary disease, unspecified: Secondary | ICD-10-CM | POA: Diagnosis not present

## 2023-03-26 DIAGNOSIS — G894 Chronic pain syndrome: Secondary | ICD-10-CM | POA: Diagnosis not present

## 2023-03-26 DIAGNOSIS — M25561 Pain in right knee: Secondary | ICD-10-CM | POA: Diagnosis not present

## 2023-03-26 DIAGNOSIS — M25579 Pain in unspecified ankle and joints of unspecified foot: Secondary | ICD-10-CM | POA: Diagnosis not present

## 2023-03-26 DIAGNOSIS — M4626 Osteomyelitis of vertebra, lumbar region: Secondary | ICD-10-CM | POA: Diagnosis not present

## 2023-03-26 DIAGNOSIS — M549 Dorsalgia, unspecified: Secondary | ICD-10-CM | POA: Diagnosis not present

## 2023-04-01 DIAGNOSIS — L409 Psoriasis, unspecified: Secondary | ICD-10-CM | POA: Diagnosis not present

## 2023-04-01 DIAGNOSIS — M545 Low back pain, unspecified: Secondary | ICD-10-CM | POA: Diagnosis not present

## 2023-04-01 DIAGNOSIS — M4626 Osteomyelitis of vertebra, lumbar region: Secondary | ICD-10-CM | POA: Diagnosis not present

## 2023-04-08 DIAGNOSIS — J449 Chronic obstructive pulmonary disease, unspecified: Secondary | ICD-10-CM | POA: Diagnosis not present

## 2023-04-08 DIAGNOSIS — G4733 Obstructive sleep apnea (adult) (pediatric): Secondary | ICD-10-CM | POA: Diagnosis not present

## 2023-04-09 ENCOUNTER — Other Ambulatory Visit: Payer: Self-pay | Admitting: Cardiology

## 2023-04-16 DIAGNOSIS — I82401 Acute embolism and thrombosis of unspecified deep veins of right lower extremity: Secondary | ICD-10-CM | POA: Diagnosis not present

## 2023-04-16 DIAGNOSIS — G894 Chronic pain syndrome: Secondary | ICD-10-CM | POA: Diagnosis not present

## 2023-04-16 DIAGNOSIS — R5381 Other malaise: Secondary | ICD-10-CM | POA: Diagnosis not present

## 2023-04-17 DIAGNOSIS — I868 Varicose veins of other specified sites: Secondary | ICD-10-CM | POA: Diagnosis not present

## 2023-04-17 DIAGNOSIS — M1990 Unspecified osteoarthritis, unspecified site: Secondary | ICD-10-CM | POA: Diagnosis not present

## 2023-04-17 DIAGNOSIS — Z79899 Other long term (current) drug therapy: Secondary | ICD-10-CM | POA: Diagnosis not present

## 2023-04-17 DIAGNOSIS — I2510 Atherosclerotic heart disease of native coronary artery without angina pectoris: Secondary | ICD-10-CM | POA: Diagnosis not present

## 2023-04-17 DIAGNOSIS — R079 Chest pain, unspecified: Secondary | ICD-10-CM | POA: Diagnosis not present

## 2023-04-17 DIAGNOSIS — L409 Psoriasis, unspecified: Secondary | ICD-10-CM | POA: Diagnosis not present

## 2023-04-17 DIAGNOSIS — R0602 Shortness of breath: Secondary | ICD-10-CM | POA: Diagnosis not present

## 2023-04-17 DIAGNOSIS — Z885 Allergy status to narcotic agent status: Secondary | ICD-10-CM | POA: Diagnosis not present

## 2023-04-17 DIAGNOSIS — K766 Portal hypertension: Secondary | ICD-10-CM | POA: Diagnosis not present

## 2023-04-17 DIAGNOSIS — R0789 Other chest pain: Secondary | ICD-10-CM | POA: Diagnosis not present

## 2023-04-17 DIAGNOSIS — J4489 Other specified chronic obstructive pulmonary disease: Secondary | ICD-10-CM | POA: Diagnosis not present

## 2023-04-17 DIAGNOSIS — R161 Splenomegaly, not elsewhere classified: Secondary | ICD-10-CM | POA: Diagnosis not present

## 2023-04-17 DIAGNOSIS — F17220 Nicotine dependence, chewing tobacco, uncomplicated: Secondary | ICD-10-CM | POA: Diagnosis not present

## 2023-04-17 DIAGNOSIS — I81 Portal vein thrombosis: Secondary | ICD-10-CM | POA: Diagnosis not present

## 2023-04-17 DIAGNOSIS — I509 Heart failure, unspecified: Secondary | ICD-10-CM | POA: Diagnosis not present

## 2023-04-17 DIAGNOSIS — K7460 Unspecified cirrhosis of liver: Secondary | ICD-10-CM | POA: Diagnosis not present

## 2023-04-17 DIAGNOSIS — I517 Cardiomegaly: Secondary | ICD-10-CM | POA: Diagnosis not present

## 2023-04-26 DIAGNOSIS — G4733 Obstructive sleep apnea (adult) (pediatric): Secondary | ICD-10-CM | POA: Diagnosis not present

## 2023-04-26 DIAGNOSIS — J449 Chronic obstructive pulmonary disease, unspecified: Secondary | ICD-10-CM | POA: Diagnosis not present

## 2023-05-06 DIAGNOSIS — M25562 Pain in left knee: Secondary | ICD-10-CM | POA: Diagnosis not present

## 2023-05-06 DIAGNOSIS — M549 Dorsalgia, unspecified: Secondary | ICD-10-CM | POA: Diagnosis not present

## 2023-05-06 DIAGNOSIS — Z79891 Long term (current) use of opiate analgesic: Secondary | ICD-10-CM | POA: Diagnosis not present

## 2023-05-06 DIAGNOSIS — G894 Chronic pain syndrome: Secondary | ICD-10-CM | POA: Diagnosis not present

## 2023-05-06 DIAGNOSIS — M4626 Osteomyelitis of vertebra, lumbar region: Secondary | ICD-10-CM | POA: Diagnosis not present

## 2023-05-06 DIAGNOSIS — M25579 Pain in unspecified ankle and joints of unspecified foot: Secondary | ICD-10-CM | POA: Diagnosis not present

## 2023-05-06 DIAGNOSIS — M25561 Pain in right knee: Secondary | ICD-10-CM | POA: Diagnosis not present

## 2023-05-09 DIAGNOSIS — G4733 Obstructive sleep apnea (adult) (pediatric): Secondary | ICD-10-CM | POA: Diagnosis not present

## 2023-05-09 DIAGNOSIS — J449 Chronic obstructive pulmonary disease, unspecified: Secondary | ICD-10-CM | POA: Diagnosis not present

## 2023-05-16 DIAGNOSIS — Z8639 Personal history of other endocrine, nutritional and metabolic disease: Secondary | ICD-10-CM | POA: Diagnosis not present

## 2023-05-16 DIAGNOSIS — R7303 Prediabetes: Secondary | ICD-10-CM | POA: Diagnosis not present

## 2023-05-16 DIAGNOSIS — I509 Heart failure, unspecified: Secondary | ICD-10-CM | POA: Diagnosis not present

## 2023-05-16 DIAGNOSIS — L409 Psoriasis, unspecified: Secondary | ICD-10-CM | POA: Diagnosis not present

## 2023-05-26 DIAGNOSIS — J449 Chronic obstructive pulmonary disease, unspecified: Secondary | ICD-10-CM | POA: Diagnosis not present

## 2023-05-26 DIAGNOSIS — G4733 Obstructive sleep apnea (adult) (pediatric): Secondary | ICD-10-CM | POA: Diagnosis not present

## 2023-05-28 DIAGNOSIS — L409 Psoriasis, unspecified: Secondary | ICD-10-CM | POA: Diagnosis not present

## 2023-05-30 DIAGNOSIS — I82401 Acute embolism and thrombosis of unspecified deep veins of right lower extremity: Secondary | ICD-10-CM | POA: Diagnosis not present

## 2023-05-30 DIAGNOSIS — G894 Chronic pain syndrome: Secondary | ICD-10-CM | POA: Diagnosis not present

## 2023-05-30 DIAGNOSIS — R5381 Other malaise: Secondary | ICD-10-CM | POA: Diagnosis not present

## 2023-06-05 ENCOUNTER — Inpatient Hospital Stay: Payer: 59 | Attending: Physician Assistant

## 2023-06-05 DIAGNOSIS — D509 Iron deficiency anemia, unspecified: Secondary | ICD-10-CM | POA: Insufficient documentation

## 2023-06-05 DIAGNOSIS — E538 Deficiency of other specified B group vitamins: Secondary | ICD-10-CM | POA: Diagnosis not present

## 2023-06-05 DIAGNOSIS — D696 Thrombocytopenia, unspecified: Secondary | ICD-10-CM

## 2023-06-05 DIAGNOSIS — D708 Other neutropenia: Secondary | ICD-10-CM

## 2023-06-05 DIAGNOSIS — D61818 Other pancytopenia: Secondary | ICD-10-CM | POA: Insufficient documentation

## 2023-06-05 DIAGNOSIS — D500 Iron deficiency anemia secondary to blood loss (chronic): Secondary | ICD-10-CM

## 2023-06-05 LAB — COMPREHENSIVE METABOLIC PANEL WITH GFR
ALT: 19 U/L (ref 0–44)
AST: 38 U/L (ref 15–41)
Albumin: 3 g/dL — ABNORMAL LOW (ref 3.5–5.0)
Alkaline Phosphatase: 88 U/L (ref 38–126)
Anion gap: 11 (ref 5–15)
BUN: 10 mg/dL (ref 6–20)
CO2: 23 mmol/L (ref 22–32)
Calcium: 8.8 mg/dL — ABNORMAL LOW (ref 8.9–10.3)
Chloride: 104 mmol/L (ref 98–111)
Creatinine, Ser: 0.59 mg/dL — ABNORMAL LOW (ref 0.61–1.24)
GFR, Estimated: >60 mL/min (ref >60)
Glucose, Bld: 137 mg/dL — ABNORMAL HIGH (ref 70–99)
Potassium: 3.7 mmol/L (ref 3.5–5.1)
Sodium: 138 mmol/L (ref 135–145)
Total Bilirubin: 1.2 mg/dL (ref 0.0–1.2)
Total Protein: 6.4 g/dL — ABNORMAL LOW (ref 6.5–8.1)

## 2023-06-05 LAB — CBC WITH DIFFERENTIAL/PLATELET
Abs Immature Granulocytes: 0.01 10*3/uL (ref 0.00–0.07)
Basophils Absolute: 0.1 10*3/uL (ref 0.0–0.1)
Basophils Relative: 2 %
Eosinophils Absolute: 0.2 10*3/uL (ref 0.0–0.5)
Eosinophils Relative: 6 %
HCT: 45.8 % (ref 39.0–52.0)
Hemoglobin: 15.6 g/dL (ref 13.0–17.0)
Immature Granulocytes: 0 %
Lymphocytes Relative: 19 %
Lymphs Abs: 0.5 10*3/uL — ABNORMAL LOW (ref 0.7–4.0)
MCH: 30.6 pg (ref 26.0–34.0)
MCHC: 34.1 g/dL (ref 30.0–36.0)
MCV: 89.8 fL (ref 80.0–100.0)
Monocytes Absolute: 0.2 10*3/uL (ref 0.1–1.0)
Monocytes Relative: 9 %
Neutro Abs: 1.7 10*3/uL (ref 1.7–7.7)
Neutrophils Relative %: 64 %
Platelets: 50 10*3/uL — ABNORMAL LOW (ref 150–400)
RBC: 5.1 MIL/uL (ref 4.22–5.81)
RDW: 13.6 % (ref 11.5–15.5)
WBC: 2.7 10*3/uL — ABNORMAL LOW (ref 4.0–10.5)
nRBC: 0 % (ref 0.0–0.2)

## 2023-06-05 LAB — VITAMIN B12: Vitamin B-12: 569 pg/mL (ref 180–914)

## 2023-06-05 LAB — FERRITIN: Ferritin: 76 ng/mL (ref 24–336)

## 2023-06-05 LAB — IRON AND TIBC
Iron: 69 ug/dL (ref 45–182)
Saturation Ratios: 24 % (ref 17.9–39.5)
TIBC: 292 ug/dL (ref 250–450)
UIBC: 223 ug/dL

## 2023-06-07 LAB — METHYLMALONIC ACID, SERUM: Methylmalonic Acid, Quantitative: 226 nmol/L (ref 0–378)

## 2023-06-09 DIAGNOSIS — G4733 Obstructive sleep apnea (adult) (pediatric): Secondary | ICD-10-CM | POA: Diagnosis not present

## 2023-06-09 DIAGNOSIS — J449 Chronic obstructive pulmonary disease, unspecified: Secondary | ICD-10-CM | POA: Diagnosis not present

## 2023-06-11 NOTE — Progress Notes (Unsigned)
 VIRTUAL VISIT via TELEPHONE NOTE [facility]   I connected with [name]  on 06/12/23 at  12:31 PM by telephone and verified that I am speaking with the correct person using two identifiers.  Location: Patient: Home Provider: [facility]   I discussed the limitations, risks, security and privacy concerns of performing an evaluation and management service by telephone and the availability of in person appointments. I also discussed with the patient that there may be a patient responsible charge related to this service. The patient expressed understanding and agreed to proceed.  REASON FOR VISIT: Follow-up for pancytopenia   CURRENT THERAPY: Close surveillance with intermittent IV iron  (last Feraheme  on 10/01/2022)  INTERVAL HISTORY:   Mr. Scioneaux 58 y.o. male is contacted today for routine follow-up of leukopenia/thrombocytopenia (secondary to cirrhosis and splenomegaly) and iron  deficiency anemia (secondary to malabsorption in the setting of alcohol abuse and cirrhosis, possible slow chronic blood loss from varices and portal hypertensive gastropathy).  He was last seen in clinic by Sheril Dines PA-C on 02/04/2023.   At today's visit, he reports feeling fairly well. He remains fatigued. He continues to take daily iron , daily folate, and vitamin B12 500 mcg 3 times weekly. He continues to drink about 40 ounces of beer daily. He reports mild bruising but denies any petechial rash. He bleeds easily when cut, but is able to stop bleeding within a reasonable time. He reports dark bowel movements that he attributes to iron ; he was Hemoccult negative when previously checked at [facility]. He denies any hematochezia, hematuria, epistaxis, hemoptysis, hematemesis, or gum bleeding. He has not had any infections since his spinal osteomyelitis in October 2022. He denies any B symptoms such as fever, chills, night sweats, unintentional weight loss. He  has stable shortness of breath on exertion but denies any recent chest pain, presyncopal episodes, or palpitations. No pica.  He has occasional chest discomfort associated with panic attacks.  He has 50% energy and 100% appetite.  He reports fluctuating weight, but is trying to become more active.   ASSESSMENT & PLAN:  1.   Pancytopenia, secondary to cirrhosis of the liver and splenomegaly - Secondary to cirrhosis and splenomegaly. - Status post Bone Marrow Biopsy on 10/24/2015 that demonstrated normocellular bone marrow with trilineage hematopoiesis and normal cytogenetics - Abdominal ultrasound performed on 03/02/2020 showed liver cirrhosis and marked splenomegaly, with spleen measuring 20.6 cm in length with a calculated volume 2334 mL - Nutritional panel from 06/21/2020 showed normal B12, normal folate; SPEP normal - Patient had platelet transfusion on 05/27/2020 prior to his EGD/colonoscopy - Most recent labs (06/05/2023): WBC 2.7/ANC 1.7/ALC 0.5.  Platelets 50.  Hgb 15.6.  CMP at baseline.  (Previous immature platelet fraction and LDH was normal.) - Positive for easy bruising without petechial rash.  No major bleeding events. - Stools are dark from iron .  Hemoccult negative when checked at [facility]. - No B symptoms or frequent infections.   - DISCUSSION: Do not recommend partial splenic embolization (PSE) at this time, based on the following: Although chronic liver disease is associated with thrombocytopenia, severe thrombocytopenia (platelets <30-40 K) is rare. PSE has high risk of complications, and improvement in platelet count does not appear to be longstanding. PSE would only affect one aspect of multifactorial causes of thrombocytopenia in cirrhotic patients, and would not resolve underlying portal hypertension that is felt to be the driving risk factor of bleeding in the majority of cirrhotic patients. Overall, patients  with liver cirrhosis have rebalanced hemostasis due to reduced  synthesis of procoagulants as well as reduced synthesis of anticoagulants, increased production of VWF, thrombocytopenia, and impaired platelet function.  Although the net effect of these changes is rebalanced hemostasis, hemostatic equilibrium is unstable and can be altered either toward bleeding or thrombosis depending on clinical conditions.  (Patient could be tipped more toward hemorrhage if triggered by progressive portal hypertension, renal failure, or infection.) PSE could be considered in cases of splenic vein thrombosis or if the patient developed bleeding gastric varices. Long-term use of TPO receptor agonists is not currently recommended due to increased risk of portal vein thrombus.  If necessary, could be given preoperatively if platelets <50 K.  - PLAN: Platelets remain low but at usual baseline, without any evidence of active bleeding.  He may also have some increased platelet consumption in the setting of known portal vein thrombus.   - We will continue monitor closely with repeat labs and RTC in 6  months   2. Portal vein thrombus - Abdominal ultrasound performed 03/02/2020 continues to show chronic portal vein thrombus described as a stable nonocclusive intraluminal thrombus within the main portal vein - History of GI bleeding while on warfarin for portal vein thrombus - Not a good candidate for anticoagulation due to thrombocytopenia, history of GI bleeding, and ongoing high bleeding risk   3.  Iron  deficiency anemia - Likely secondary to malabsorption of nutrients in the setting of alcohol use and cirrhosis, may also have some slow chronic blood loss  - Unable to tolerate oral iron  - Has required intermittent IV infusions, with last IV Venofer  given in March/April 2022 - Recent EGD (07/31/2022): Grade 1 esophageal varices.  Moderate portal hypertensive gastropathy.  (Previously required banding for varices and had findings consistent with gastric antral vascular ectasia) - Recent  colonoscopy (05/27/2020): two nonbleeding colonic angiodysplastic lesions; nonbleeding internal hemorrhoids; diverticulosis in sigmoid and descending colon - He is taking iron  tablet daily.   - He denies any gross rectal bleeding.  Stool is dark from iron , but Hemoccult negative when checked at [facility]. - Most recent labs (06/05/2023): Hgb 15.6/MCV 89.8.  Ferritin 76, iron  saturation 24%  - PLAN: No indication for IV iron  at this time. - Continue oral iron  supplementation.   - We will repeat labs and RTC in 6 months.    4.  Vitamin B12 and folic acid  deficiency - Labs from 10/09/2021 show marginal B12 288 with normal MMA. - He is taking vitamin B12 500 mcg on MWF and folic acid  400 mcg daily - Most recent labs (06/05/2023): Normal B12 at 569, normal MMA.  Normal folate. - PLAN: Continue vitamin B12 500 mcg 3 times weekly and folic acid  daily. - Recheck B12/MMA in folate annually (June 2026)   5. Liver cirrhosis - Follows with GI for hepatologic management - Receiving abdominal ultrasounds via GI every 6 months to screen for [facility], given his high risk status of developing hepatocellular carcinoma - PLAN: Continue follow-up with Dr. Sammi Crick and NP Gayle Kava  6.  Alcohol abuse - He admits that he has started drinking alcohol again.  He reports that he was drinking "heavily" in October/November 2024, reportedly drinking up to 6 beers and a pint of liquor daily. - At today's visit (06/12/2023), he reports drinking about 48 ounces of beer daily- He states that he "just likes the taste."   - PLAN: Discussed importance of STRICT abstinence from alcohol and offered resources, which he declined.  PLAN SUMMARY: >> Labs in 6 months = CMP, CBC/D, ferritin, iron /TIBC, B12, MMA >> OFFICE visit in 6 months (1 week after labs)   ** Last office visit 02/04/2023      REVIEW OF SYSTEMS:   Review of Systems  Constitutional:  Positive for malaise/fatigue. Negative for chills, diaphoresis, fever and  weight loss.  Respiratory:  Negative for cough and shortness of breath.   Cardiovascular:  Negative for chest pain and palpitations.  Gastrointestinal:  Negative for abdominal pain, blood in stool, melena, nausea and vomiting.  Musculoskeletal:  Positive for joint pain.  Neurological:  Positive for tingling. Negative for dizziness and headaches.  Psychiatric/Behavioral:  The patient is nervous/anxious.     PHYSICAL EXAM: (per limitations of virtual telephone visit)  The patient is alert and oriented x 3, exhibiting adequate mentation, good mood, and ability to speak in full sentences and execute sound judgement.  WRAP UP:   I discussed the assessment and treatment plan with the patient. The patient was provided an opportunity to ask questions and all were answered. The patient agreed with the plan and demonstrated an understanding of the instructions.   The patient was advised to call back or seek an in-person evaluation if the symptoms worsen or if the condition fails to improve as anticipated.  I provided 22 minutes of non-face-to-face time during this encounter, including >10 minutes of medical discussion.  Sonnie Dusky, PA-C 06/12/23 12:47 PM

## 2023-06-12 ENCOUNTER — Inpatient Hospital Stay: Payer: 59 | Attending: Physician Assistant | Admitting: Physician Assistant

## 2023-06-12 DIAGNOSIS — I81 Portal vein thrombosis: Secondary | ICD-10-CM | POA: Diagnosis not present

## 2023-06-12 DIAGNOSIS — D696 Thrombocytopenia, unspecified: Secondary | ICD-10-CM

## 2023-06-12 DIAGNOSIS — E538 Deficiency of other specified B group vitamins: Secondary | ICD-10-CM

## 2023-06-12 DIAGNOSIS — D500 Iron deficiency anemia secondary to blood loss (chronic): Secondary | ICD-10-CM

## 2023-06-12 DIAGNOSIS — D708 Other neutropenia: Secondary | ICD-10-CM

## 2023-06-12 DIAGNOSIS — D509 Iron deficiency anemia, unspecified: Secondary | ICD-10-CM

## 2023-06-12 DIAGNOSIS — D61818 Other pancytopenia: Secondary | ICD-10-CM | POA: Diagnosis not present

## 2023-06-12 MED ORDER — FOLIC ACID 800 MCG PO TABS: 800.0000 ug | ORAL_TABLET | Freq: Every day | ORAL | 11 refills | Status: DC

## 2023-06-12 MED ORDER — CYANOCOBALAMIN 500 MCG PO TABS: 500.0000 ug | ORAL_TABLET | ORAL | 3 refills | Status: DC

## 2023-06-12 MED ORDER — FERROUS SULFATE 325 (65 FE) MG PO TBEC: 325.0000 mg | DELAYED_RELEASE_TABLET | Freq: Every day | ORAL | 3 refills | Status: DC

## 2023-06-17 DIAGNOSIS — M25561 Pain in right knee: Secondary | ICD-10-CM | POA: Diagnosis not present

## 2023-06-17 DIAGNOSIS — Z79891 Long term (current) use of opiate analgesic: Secondary | ICD-10-CM | POA: Diagnosis not present

## 2023-06-17 DIAGNOSIS — M549 Dorsalgia, unspecified: Secondary | ICD-10-CM | POA: Diagnosis not present

## 2023-06-17 DIAGNOSIS — G894 Chronic pain syndrome: Secondary | ICD-10-CM | POA: Diagnosis not present

## 2023-06-17 DIAGNOSIS — M4626 Osteomyelitis of vertebra, lumbar region: Secondary | ICD-10-CM | POA: Diagnosis not present

## 2023-06-17 DIAGNOSIS — M25579 Pain in unspecified ankle and joints of unspecified foot: Secondary | ICD-10-CM | POA: Diagnosis not present

## 2023-06-17 DIAGNOSIS — M25562 Pain in left knee: Secondary | ICD-10-CM | POA: Diagnosis not present

## 2023-06-20 DIAGNOSIS — Z20822 Contact with and (suspected) exposure to covid-19: Secondary | ICD-10-CM | POA: Diagnosis not present

## 2023-06-20 DIAGNOSIS — R5383 Other fatigue: Secondary | ICD-10-CM | POA: Diagnosis not present

## 2023-06-20 DIAGNOSIS — N3000 Acute cystitis without hematuria: Secondary | ICD-10-CM | POA: Diagnosis not present

## 2023-06-20 DIAGNOSIS — R531 Weakness: Secondary | ICD-10-CM | POA: Diagnosis not present

## 2023-06-20 DIAGNOSIS — I509 Heart failure, unspecified: Secondary | ICD-10-CM | POA: Diagnosis not present

## 2023-06-20 DIAGNOSIS — F17220 Nicotine dependence, chewing tobacco, uncomplicated: Secondary | ICD-10-CM | POA: Diagnosis not present

## 2023-06-20 DIAGNOSIS — Z79899 Other long term (current) drug therapy: Secondary | ICD-10-CM | POA: Diagnosis not present

## 2023-06-20 DIAGNOSIS — J449 Chronic obstructive pulmonary disease, unspecified: Secondary | ICD-10-CM | POA: Diagnosis not present

## 2023-06-20 DIAGNOSIS — N390 Urinary tract infection, site not specified: Secondary | ICD-10-CM | POA: Diagnosis not present

## 2023-06-20 DIAGNOSIS — R350 Frequency of micturition: Secondary | ICD-10-CM | POA: Diagnosis not present

## 2023-06-24 ENCOUNTER — Ambulatory Visit: Payer: Self-pay | Admitting: Family Medicine

## 2023-06-24 NOTE — Telephone Encounter (Signed)
 Dr Villa Greaser- please see E2C2 note and advise. There are no appts open in [place] with any provider at this time.

## 2023-06-24 NOTE — Telephone Encounter (Signed)
 Lind Repine, MD to Me  [facility] Admin      06/24/23  4:32 PM Please obtain next available appt for him @ [place] He will have to follow up with PCP meantime for this problem if we cannot get him in timely  Routing to admin pool for pt to be scheduled when there is an opening in [place].

## 2023-06-24 NOTE — Telephone Encounter (Signed)
 FYI Only or Action Required?: Action required by provider  Patient is followed in Pulmonology for obstructive sleep apnea, last seen on 02/13/2022 by Lind Repine, MD. Called Nurse Triage reporting Shortness of Breath. Symptoms began several weeks ago. Symptoms are: unchanged.  Triage Disposition: See HCP Within 4 Hours (Or PCP Triage)  Patient/caregiver understands and will follow disposition?: No, wishes to speak with PCP         Copied from CRM [id number]. Topic: Clinical - Red Word Triage >> Jun 24, 2023  8:42 AM [name] wrote: Red Word that prompted transfer to Nurse Triage: Patient states SOB often for about a month, states occurs when doing stuff. Walking on crutches and that is making it worse. Reason for Disposition  [1] MILD difficulty breathing (e.g., minimal/no SOB at rest, SOB with walking, pulse <100) AND [2] NEW-onset or WORSE than normal  Answer Assessment - Initial Assessment Questions This RN offered patient appointments but pt only wants to go to [place] office. Next available appt in August. Pt states he is going to call his PCP about getting in. This RN will send a high priority message to CAL to see if pt can get in sooner at [place] office.    SOB with movement or talking a lot  Onset: Not sure, several weeks  Walking on crutches currently Didn't seem to always be like this. It is about time for me to have a follow up Denies wheezing, coughing, chest pain, dizziness  Protocols used: Breathing Difficulty-A-AH

## 2023-06-26 ENCOUNTER — Encounter (HOSPITAL_BASED_OUTPATIENT_CLINIC_OR_DEPARTMENT_OTHER): Payer: Self-pay

## 2023-07-02 ENCOUNTER — Encounter: Payer: Self-pay | Admitting: Hematology

## 2023-07-03 ENCOUNTER — Telehealth: Payer: Self-pay | Admitting: Pulmonary Disease

## 2023-07-03 NOTE — Telephone Encounter (Signed)
 Copied from CRM [id number]. Topic: Clinical - Lab/Test Results >> Jul 03, 2023  8:50 AM Celestine FALCON wrote: Reason for CRM: Pt recently requested a letter to be sent out to his residence explaining his CPAP usage and his diagnosis of sleep apnea. Pt stated he now also wants a letter explaining his results of his sleep study mailed out to his address. I asked the patient for the core reason of this need, and he said it is for his file at home in case he's unable to speak in the future. Pt stated he would like to have this info as well so that if anyone needs to assist him in the future they will know what condition he has.   Also, the pt is requesting the most recent letter sent out on 06/26/2023 never arrived. Pt is asking if we can resend this out to his address.   Pt's address is [address] [place] [address]   Pt's phone number is [phone].

## 2023-07-03 NOTE — Telephone Encounter (Signed)
 Sleep Study printed and letter re-mailed with sleep study

## 2023-07-05 DIAGNOSIS — G4733 Obstructive sleep apnea (adult) (pediatric): Secondary | ICD-10-CM | POA: Diagnosis not present

## 2023-07-05 DIAGNOSIS — J449 Chronic obstructive pulmonary disease, unspecified: Secondary | ICD-10-CM | POA: Diagnosis not present

## 2023-07-09 DIAGNOSIS — G4733 Obstructive sleep apnea (adult) (pediatric): Secondary | ICD-10-CM | POA: Diagnosis not present

## 2023-07-10 DIAGNOSIS — E559 Vitamin D deficiency, unspecified: Secondary | ICD-10-CM | POA: Diagnosis not present

## 2023-07-10 DIAGNOSIS — J45909 Unspecified asthma, uncomplicated: Secondary | ICD-10-CM | POA: Diagnosis not present

## 2023-07-10 DIAGNOSIS — R5383 Other fatigue: Secondary | ICD-10-CM | POA: Diagnosis not present

## 2023-07-10 DIAGNOSIS — N390 Urinary tract infection, site not specified: Secondary | ICD-10-CM | POA: Diagnosis not present

## 2023-07-10 DIAGNOSIS — R7303 Prediabetes: Secondary | ICD-10-CM | POA: Diagnosis not present

## 2023-07-10 DIAGNOSIS — R319 Hematuria, unspecified: Secondary | ICD-10-CM | POA: Diagnosis not present

## 2023-07-10 DIAGNOSIS — G4733 Obstructive sleep apnea (adult) (pediatric): Secondary | ICD-10-CM | POA: Diagnosis not present

## 2023-07-22 DIAGNOSIS — R0602 Shortness of breath: Secondary | ICD-10-CM | POA: Diagnosis not present

## 2023-07-22 DIAGNOSIS — I509 Heart failure, unspecified: Secondary | ICD-10-CM | POA: Diagnosis not present

## 2023-07-22 DIAGNOSIS — Z1152 Encounter for screening for COVID-19: Secondary | ICD-10-CM | POA: Diagnosis not present

## 2023-07-22 DIAGNOSIS — Z720 Tobacco use: Secondary | ICD-10-CM | POA: Diagnosis not present

## 2023-07-22 DIAGNOSIS — J45909 Unspecified asthma, uncomplicated: Secondary | ICD-10-CM | POA: Diagnosis not present

## 2023-07-22 DIAGNOSIS — M1990 Unspecified osteoarthritis, unspecified site: Secondary | ICD-10-CM | POA: Diagnosis not present

## 2023-07-22 DIAGNOSIS — K7460 Unspecified cirrhosis of liver: Secondary | ICD-10-CM | POA: Diagnosis not present

## 2023-07-22 DIAGNOSIS — Z20822 Contact with and (suspected) exposure to covid-19: Secondary | ICD-10-CM | POA: Diagnosis not present

## 2023-07-22 DIAGNOSIS — R0600 Dyspnea, unspecified: Secondary | ICD-10-CM | POA: Diagnosis not present

## 2023-07-22 DIAGNOSIS — Z888 Allergy status to other drugs, medicaments and biological substances status: Secondary | ICD-10-CM | POA: Diagnosis not present

## 2023-07-22 DIAGNOSIS — Z79899 Other long term (current) drug therapy: Secondary | ICD-10-CM | POA: Diagnosis not present

## 2023-07-22 DIAGNOSIS — N3000 Acute cystitis without hematuria: Secondary | ICD-10-CM | POA: Diagnosis not present

## 2023-07-23 DIAGNOSIS — R7303 Prediabetes: Secondary | ICD-10-CM | POA: Diagnosis not present

## 2023-07-23 DIAGNOSIS — R319 Hematuria, unspecified: Secondary | ICD-10-CM | POA: Diagnosis not present

## 2023-07-23 DIAGNOSIS — R5383 Other fatigue: Secondary | ICD-10-CM | POA: Diagnosis not present

## 2023-07-23 DIAGNOSIS — N390 Urinary tract infection, site not specified: Secondary | ICD-10-CM | POA: Diagnosis not present

## 2023-07-23 DIAGNOSIS — E559 Vitamin D deficiency, unspecified: Secondary | ICD-10-CM | POA: Diagnosis not present

## 2023-07-23 DIAGNOSIS — R1084 Generalized abdominal pain: Secondary | ICD-10-CM | POA: Diagnosis not present

## 2023-07-23 DIAGNOSIS — K7460 Unspecified cirrhosis of liver: Secondary | ICD-10-CM | POA: Diagnosis not present

## 2023-07-23 DIAGNOSIS — E860 Dehydration: Secondary | ICD-10-CM | POA: Diagnosis not present

## 2023-07-25 DIAGNOSIS — I10 Essential (primary) hypertension: Secondary | ICD-10-CM | POA: Diagnosis not present

## 2023-07-25 DIAGNOSIS — I5032 Chronic diastolic (congestive) heart failure: Secondary | ICD-10-CM | POA: Diagnosis not present

## 2023-07-25 DIAGNOSIS — R21 Rash and other nonspecific skin eruption: Secondary | ICD-10-CM | POA: Diagnosis not present

## 2023-07-25 DIAGNOSIS — I2089 Other forms of angina pectoris: Secondary | ICD-10-CM | POA: Diagnosis not present

## 2023-07-25 DIAGNOSIS — I480 Paroxysmal atrial fibrillation: Secondary | ICD-10-CM | POA: Diagnosis not present

## 2023-07-25 DIAGNOSIS — E785 Hyperlipidemia, unspecified: Secondary | ICD-10-CM | POA: Diagnosis not present

## 2023-07-29 ENCOUNTER — Other Ambulatory Visit (INDEPENDENT_AMBULATORY_CARE_PROVIDER_SITE_OTHER): Payer: Self-pay | Admitting: Gastroenterology

## 2023-07-29 ENCOUNTER — Other Ambulatory Visit: Payer: Self-pay | Admitting: Cardiology

## 2023-07-29 DIAGNOSIS — Z9189 Other specified personal risk factors, not elsewhere classified: Secondary | ICD-10-CM

## 2023-07-29 DIAGNOSIS — E1165 Type 2 diabetes mellitus with hyperglycemia: Secondary | ICD-10-CM

## 2023-07-29 NOTE — Telephone Encounter (Signed)
 Needs office visit.

## 2023-07-30 DIAGNOSIS — I110 Hypertensive heart disease with heart failure: Secondary | ICD-10-CM | POA: Diagnosis not present

## 2023-07-30 DIAGNOSIS — Z888 Allergy status to other drugs, medicaments and biological substances status: Secondary | ICD-10-CM | POA: Diagnosis not present

## 2023-07-30 DIAGNOSIS — M1990 Unspecified osteoarthritis, unspecified site: Secondary | ICD-10-CM | POA: Diagnosis not present

## 2023-07-30 DIAGNOSIS — J449 Chronic obstructive pulmonary disease, unspecified: Secondary | ICD-10-CM | POA: Diagnosis not present

## 2023-07-30 DIAGNOSIS — K7460 Unspecified cirrhosis of liver: Secondary | ICD-10-CM | POA: Diagnosis not present

## 2023-07-30 DIAGNOSIS — L409 Psoriasis, unspecified: Secondary | ICD-10-CM | POA: Diagnosis not present

## 2023-07-30 DIAGNOSIS — F17220 Nicotine dependence, chewing tobacco, uncomplicated: Secondary | ICD-10-CM | POA: Diagnosis not present

## 2023-07-30 DIAGNOSIS — Z91118 Patient's noncompliance with dietary regimen for other reason: Secondary | ICD-10-CM | POA: Diagnosis not present

## 2023-07-30 DIAGNOSIS — Z7982 Long term (current) use of aspirin: Secondary | ICD-10-CM | POA: Diagnosis not present

## 2023-07-30 DIAGNOSIS — R0600 Dyspnea, unspecified: Secondary | ICD-10-CM | POA: Diagnosis not present

## 2023-07-30 DIAGNOSIS — I5032 Chronic diastolic (congestive) heart failure: Secondary | ICD-10-CM | POA: Diagnosis not present

## 2023-07-30 DIAGNOSIS — Z792 Long term (current) use of antibiotics: Secondary | ICD-10-CM | POA: Diagnosis not present

## 2023-07-30 DIAGNOSIS — R002 Palpitations: Secondary | ICD-10-CM | POA: Diagnosis not present

## 2023-07-30 DIAGNOSIS — R0602 Shortness of breath: Secondary | ICD-10-CM | POA: Diagnosis not present

## 2023-07-30 DIAGNOSIS — I4891 Unspecified atrial fibrillation: Secondary | ICD-10-CM | POA: Diagnosis not present

## 2023-07-30 DIAGNOSIS — F17290 Nicotine dependence, other tobacco product, uncomplicated: Secondary | ICD-10-CM | POA: Diagnosis not present

## 2023-07-30 DIAGNOSIS — Z7289 Other problems related to lifestyle: Secondary | ICD-10-CM | POA: Diagnosis not present

## 2023-07-30 DIAGNOSIS — Z79899 Other long term (current) drug therapy: Secondary | ICD-10-CM | POA: Diagnosis not present

## 2023-08-04 DIAGNOSIS — G4733 Obstructive sleep apnea (adult) (pediatric): Secondary | ICD-10-CM | POA: Diagnosis not present

## 2023-08-06 ENCOUNTER — Other Ambulatory Visit (INDEPENDENT_AMBULATORY_CARE_PROVIDER_SITE_OTHER): Payer: Self-pay | Admitting: Gastroenterology

## 2023-08-17 DIAGNOSIS — I4891 Unspecified atrial fibrillation: Secondary | ICD-10-CM | POA: Diagnosis not present

## 2023-08-17 DIAGNOSIS — K7460 Unspecified cirrhosis of liver: Secondary | ICD-10-CM | POA: Diagnosis not present

## 2023-08-17 DIAGNOSIS — I8290 Acute embolism and thrombosis of unspecified vein: Secondary | ICD-10-CM | POA: Diagnosis not present

## 2023-08-17 DIAGNOSIS — R161 Splenomegaly, not elsewhere classified: Secondary | ICD-10-CM | POA: Diagnosis not present

## 2023-08-17 DIAGNOSIS — I81 Portal vein thrombosis: Secondary | ICD-10-CM | POA: Diagnosis not present

## 2023-08-17 DIAGNOSIS — R509 Fever, unspecified: Secondary | ICD-10-CM | POA: Diagnosis not present

## 2023-08-17 DIAGNOSIS — K55069 Acute infarction of intestine, part and extent unspecified: Secondary | ICD-10-CM | POA: Diagnosis not present

## 2023-08-17 DIAGNOSIS — L409 Psoriasis, unspecified: Secondary | ICD-10-CM | POA: Diagnosis not present

## 2023-08-17 DIAGNOSIS — M1990 Unspecified osteoarthritis, unspecified site: Secondary | ICD-10-CM | POA: Diagnosis not present

## 2023-08-17 DIAGNOSIS — R0689 Other abnormalities of breathing: Secondary | ICD-10-CM | POA: Diagnosis not present

## 2023-08-17 DIAGNOSIS — R188 Other ascites: Secondary | ICD-10-CM | POA: Diagnosis not present

## 2023-08-17 DIAGNOSIS — R1900 Intra-abdominal and pelvic swelling, mass and lump, unspecified site: Secondary | ICD-10-CM | POA: Diagnosis not present

## 2023-08-17 DIAGNOSIS — R197 Diarrhea, unspecified: Secondary | ICD-10-CM | POA: Diagnosis not present

## 2023-08-17 DIAGNOSIS — K551 Chronic vascular disorders of intestine: Secondary | ICD-10-CM | POA: Diagnosis not present

## 2023-08-17 DIAGNOSIS — J4489 Other specified chronic obstructive pulmonary disease: Secondary | ICD-10-CM | POA: Diagnosis not present

## 2023-08-17 DIAGNOSIS — Z7982 Long term (current) use of aspirin: Secondary | ICD-10-CM | POA: Diagnosis not present

## 2023-08-17 DIAGNOSIS — Z79899 Other long term (current) drug therapy: Secondary | ICD-10-CM | POA: Diagnosis not present

## 2023-08-17 DIAGNOSIS — R609 Edema, unspecified: Secondary | ICD-10-CM | POA: Diagnosis not present

## 2023-08-17 DIAGNOSIS — R110 Nausea: Secondary | ICD-10-CM | POA: Diagnosis not present

## 2023-08-17 DIAGNOSIS — D735 Infarction of spleen: Secondary | ICD-10-CM | POA: Diagnosis not present

## 2023-08-17 DIAGNOSIS — I82890 Acute embolism and thrombosis of other specified veins: Secondary | ICD-10-CM | POA: Diagnosis not present

## 2023-08-18 DIAGNOSIS — I81 Portal vein thrombosis: Secondary | ICD-10-CM | POA: Diagnosis not present

## 2023-08-18 DIAGNOSIS — K55069 Acute infarction of intestine, part and extent unspecified: Secondary | ICD-10-CM | POA: Diagnosis not present

## 2023-08-18 DIAGNOSIS — R161 Splenomegaly, not elsewhere classified: Secondary | ICD-10-CM | POA: Diagnosis not present

## 2023-08-18 DIAGNOSIS — M069 Rheumatoid arthritis, unspecified: Secondary | ICD-10-CM | POA: Diagnosis not present

## 2023-08-18 DIAGNOSIS — I110 Hypertensive heart disease with heart failure: Secondary | ICD-10-CM | POA: Diagnosis not present

## 2023-08-18 DIAGNOSIS — E872 Acidosis, unspecified: Secondary | ICD-10-CM | POA: Diagnosis not present

## 2023-08-18 DIAGNOSIS — Z7982 Long term (current) use of aspirin: Secondary | ICD-10-CM | POA: Diagnosis not present

## 2023-08-18 DIAGNOSIS — M7989 Other specified soft tissue disorders: Secondary | ICD-10-CM | POA: Diagnosis not present

## 2023-08-18 DIAGNOSIS — I82890 Acute embolism and thrombosis of other specified veins: Secondary | ICD-10-CM | POA: Diagnosis not present

## 2023-08-18 DIAGNOSIS — G4733 Obstructive sleep apnea (adult) (pediatric): Secondary | ICD-10-CM | POA: Diagnosis not present

## 2023-08-18 DIAGNOSIS — G8929 Other chronic pain: Secondary | ICD-10-CM | POA: Diagnosis not present

## 2023-08-18 DIAGNOSIS — Z79891 Long term (current) use of opiate analgesic: Secondary | ICD-10-CM | POA: Diagnosis not present

## 2023-08-18 DIAGNOSIS — I5032 Chronic diastolic (congestive) heart failure: Secondary | ICD-10-CM | POA: Diagnosis not present

## 2023-08-18 DIAGNOSIS — J449 Chronic obstructive pulmonary disease, unspecified: Secondary | ICD-10-CM | POA: Diagnosis not present

## 2023-08-18 DIAGNOSIS — D6859 Other primary thrombophilia: Secondary | ICD-10-CM | POA: Diagnosis not present

## 2023-08-18 DIAGNOSIS — I8510 Secondary esophageal varices without bleeding: Secondary | ICD-10-CM | POA: Diagnosis not present

## 2023-08-18 DIAGNOSIS — K219 Gastro-esophageal reflux disease without esophagitis: Secondary | ICD-10-CM | POA: Diagnosis not present

## 2023-08-18 DIAGNOSIS — K7460 Unspecified cirrhosis of liver: Secondary | ICD-10-CM | POA: Diagnosis not present

## 2023-08-18 DIAGNOSIS — M1990 Unspecified osteoarthritis, unspecified site: Secondary | ICD-10-CM | POA: Diagnosis not present

## 2023-08-18 DIAGNOSIS — G9341 Metabolic encephalopathy: Secondary | ICD-10-CM | POA: Diagnosis not present

## 2023-08-19 DIAGNOSIS — D696 Thrombocytopenia, unspecified: Secondary | ICD-10-CM | POA: Diagnosis not present

## 2023-08-19 DIAGNOSIS — I81 Portal vein thrombosis: Secondary | ICD-10-CM | POA: Diagnosis not present

## 2023-08-19 DIAGNOSIS — K55069 Acute infarction of intestine, part and extent unspecified: Secondary | ICD-10-CM | POA: Diagnosis not present

## 2023-08-19 DIAGNOSIS — I8510 Secondary esophageal varices without bleeding: Secondary | ICD-10-CM | POA: Diagnosis not present

## 2023-08-19 DIAGNOSIS — G8929 Other chronic pain: Secondary | ICD-10-CM | POA: Diagnosis not present

## 2023-08-19 DIAGNOSIS — K7460 Unspecified cirrhosis of liver: Secondary | ICD-10-CM | POA: Diagnosis not present

## 2023-08-19 DIAGNOSIS — I82890 Acute embolism and thrombosis of other specified veins: Secondary | ICD-10-CM | POA: Diagnosis not present

## 2023-08-19 DIAGNOSIS — G894 Chronic pain syndrome: Secondary | ICD-10-CM | POA: Diagnosis not present

## 2023-08-19 DIAGNOSIS — D6859 Other primary thrombophilia: Secondary | ICD-10-CM | POA: Diagnosis not present

## 2023-08-19 DIAGNOSIS — R9431 Abnormal electrocardiogram [ECG] [EKG]: Secondary | ICD-10-CM | POA: Diagnosis not present

## 2023-08-19 DIAGNOSIS — G4733 Obstructive sleep apnea (adult) (pediatric): Secondary | ICD-10-CM | POA: Diagnosis not present

## 2023-08-19 DIAGNOSIS — R188 Other ascites: Secondary | ICD-10-CM | POA: Diagnosis not present

## 2023-08-19 DIAGNOSIS — K317 Polyp of stomach and duodenum: Secondary | ICD-10-CM | POA: Diagnosis not present

## 2023-08-19 DIAGNOSIS — R109 Unspecified abdominal pain: Secondary | ICD-10-CM | POA: Diagnosis not present

## 2023-08-19 DIAGNOSIS — I4891 Unspecified atrial fibrillation: Secondary | ICD-10-CM | POA: Diagnosis not present

## 2023-08-19 DIAGNOSIS — K429 Umbilical hernia without obstruction or gangrene: Secondary | ICD-10-CM | POA: Diagnosis not present

## 2023-08-20 DIAGNOSIS — J4489 Other specified chronic obstructive pulmonary disease: Secondary | ICD-10-CM | POA: Diagnosis not present

## 2023-08-20 DIAGNOSIS — I81 Portal vein thrombosis: Secondary | ICD-10-CM | POA: Diagnosis not present

## 2023-08-20 DIAGNOSIS — D696 Thrombocytopenia, unspecified: Secondary | ICD-10-CM | POA: Diagnosis not present

## 2023-08-20 DIAGNOSIS — G8929 Other chronic pain: Secondary | ICD-10-CM | POA: Diagnosis not present

## 2023-08-20 DIAGNOSIS — I2510 Atherosclerotic heart disease of native coronary artery without angina pectoris: Secondary | ICD-10-CM | POA: Diagnosis not present

## 2023-08-20 DIAGNOSIS — I5032 Chronic diastolic (congestive) heart failure: Secondary | ICD-10-CM | POA: Diagnosis not present

## 2023-08-20 DIAGNOSIS — I517 Cardiomegaly: Secondary | ICD-10-CM | POA: Diagnosis not present

## 2023-08-20 DIAGNOSIS — I4891 Unspecified atrial fibrillation: Secondary | ICD-10-CM | POA: Diagnosis not present

## 2023-08-20 DIAGNOSIS — K7460 Unspecified cirrhosis of liver: Secondary | ICD-10-CM | POA: Diagnosis not present

## 2023-08-20 DIAGNOSIS — K55069 Acute infarction of intestine, part and extent unspecified: Secondary | ICD-10-CM | POA: Diagnosis not present

## 2023-08-20 DIAGNOSIS — K317 Polyp of stomach and duodenum: Secondary | ICD-10-CM | POA: Diagnosis not present

## 2023-08-20 DIAGNOSIS — G4733 Obstructive sleep apnea (adult) (pediatric): Secondary | ICD-10-CM | POA: Diagnosis not present

## 2023-08-20 DIAGNOSIS — I509 Heart failure, unspecified: Secondary | ICD-10-CM | POA: Diagnosis not present

## 2023-08-20 DIAGNOSIS — J449 Chronic obstructive pulmonary disease, unspecified: Secondary | ICD-10-CM | POA: Diagnosis not present

## 2023-08-20 DIAGNOSIS — D6859 Other primary thrombophilia: Secondary | ICD-10-CM | POA: Diagnosis not present

## 2023-08-20 DIAGNOSIS — E119 Type 2 diabetes mellitus without complications: Secondary | ICD-10-CM | POA: Diagnosis not present

## 2023-08-20 DIAGNOSIS — I110 Hypertensive heart disease with heart failure: Secondary | ICD-10-CM | POA: Diagnosis not present

## 2023-08-20 DIAGNOSIS — R161 Splenomegaly, not elsewhere classified: Secondary | ICD-10-CM | POA: Diagnosis not present

## 2023-08-20 DIAGNOSIS — I8510 Secondary esophageal varices without bleeding: Secondary | ICD-10-CM | POA: Diagnosis not present

## 2023-08-20 DIAGNOSIS — M48061 Spinal stenosis, lumbar region without neurogenic claudication: Secondary | ICD-10-CM | POA: Diagnosis not present

## 2023-08-21 DIAGNOSIS — K317 Polyp of stomach and duodenum: Secondary | ICD-10-CM | POA: Diagnosis not present

## 2023-08-21 DIAGNOSIS — I81 Portal vein thrombosis: Secondary | ICD-10-CM | POA: Diagnosis not present

## 2023-08-21 DIAGNOSIS — K55069 Acute infarction of intestine, part and extent unspecified: Secondary | ICD-10-CM | POA: Diagnosis not present

## 2023-08-21 DIAGNOSIS — K7460 Unspecified cirrhosis of liver: Secondary | ICD-10-CM | POA: Diagnosis not present

## 2023-08-21 DIAGNOSIS — R161 Splenomegaly, not elsewhere classified: Secondary | ICD-10-CM | POA: Diagnosis not present

## 2023-08-21 DIAGNOSIS — G4733 Obstructive sleep apnea (adult) (pediatric): Secondary | ICD-10-CM | POA: Diagnosis not present

## 2023-08-21 DIAGNOSIS — I4891 Unspecified atrial fibrillation: Secondary | ICD-10-CM | POA: Diagnosis not present

## 2023-08-21 DIAGNOSIS — I8510 Secondary esophageal varices without bleeding: Secondary | ICD-10-CM | POA: Diagnosis not present

## 2023-08-21 DIAGNOSIS — J449 Chronic obstructive pulmonary disease, unspecified: Secondary | ICD-10-CM | POA: Diagnosis not present

## 2023-08-21 DIAGNOSIS — I5032 Chronic diastolic (congestive) heart failure: Secondary | ICD-10-CM | POA: Diagnosis not present

## 2023-08-21 DIAGNOSIS — M48061 Spinal stenosis, lumbar region without neurogenic claudication: Secondary | ICD-10-CM | POA: Diagnosis not present

## 2023-08-22 DIAGNOSIS — I5032 Chronic diastolic (congestive) heart failure: Secondary | ICD-10-CM | POA: Diagnosis not present

## 2023-08-22 DIAGNOSIS — G894 Chronic pain syndrome: Secondary | ICD-10-CM | POA: Diagnosis not present

## 2023-08-22 DIAGNOSIS — K55069 Acute infarction of intestine, part and extent unspecified: Secondary | ICD-10-CM | POA: Diagnosis not present

## 2023-08-22 DIAGNOSIS — G4733 Obstructive sleep apnea (adult) (pediatric): Secondary | ICD-10-CM | POA: Diagnosis not present

## 2023-08-22 DIAGNOSIS — J449 Chronic obstructive pulmonary disease, unspecified: Secondary | ICD-10-CM | POA: Diagnosis not present

## 2023-08-22 DIAGNOSIS — D689 Coagulation defect, unspecified: Secondary | ICD-10-CM | POA: Diagnosis not present

## 2023-08-22 DIAGNOSIS — I4891 Unspecified atrial fibrillation: Secondary | ICD-10-CM | POA: Diagnosis not present

## 2023-08-22 DIAGNOSIS — D696 Thrombocytopenia, unspecified: Secondary | ICD-10-CM | POA: Diagnosis not present

## 2023-08-22 NOTE — Progress Notes (Deleted)
 [name], male    DOB: 11-07-1965    MRN: [id number]   Brief patient profile:  ***  yo*** *** former [place] patient  self-referred back to pulmonary clinic in [place]  08/23/2023  for ***   PFTs 2011 do not show obstruction, FEV1 81%, TLC 76%, DLCO 70% corrects for alveolar volume     History of Present Illness  08/23/2023  Pulmonary/ 1st office eval/ [place] / [place] Office  No chief complaint on file.    Dyspnea:  *** Cough: *** Sleep: *** SABA use: *** 02 use:*** LDSCT:***  No obvious day to day or daytime pattern/variability or assoc excess/ purulent sputum or mucus plugs or hemoptysis or cp or chest tightness, subjective wheeze or overt sinus or hb symptoms.    Also denies any obvious fluctuation of symptoms with weather or environmental changes or other aggravating or alleviating factors except as outlined above   No unusual exposure hx or h/o childhood pna/ asthma or knowledge of premature birth.  Current Allergies, Complete Past Medical History, Past Surgical History, Family History, and Social History were reviewed in [employer] record.  ROS  The following are not active complaints unless bolded Hoarseness, sore throat, dysphagia, dental problems, itching, sneezing,  nasal congestion or discharge of excess mucus or purulent secretions, ear ache,   fever, chills, sweats, unintended wt loss or wt gain, classically pleuritic or exertional cp,  orthopnea pnd or arm/hand swelling  or leg swelling, presyncope, palpitations, abdominal pain, anorexia, nausea, vomiting, diarrhea  or change in bowel habits or change in bladder habits, change in stools or change in urine, dysuria, hematuria,  rash, arthralgias, visual complaints, headache, numbness, weakness or ataxia or problems with walking or coordination,  change in mood or  memory.            Outpatient Medications Prior to Visit  Medication Sig Dispense Refill   albuterol (VENTOLIN HFA) 108  (90 Base) MCG/ACT inhaler INHALE 2 PUFFS BY MOUTH EVERY 6 HOURS AS NEEDED FOR SHORTNESS OF BREATH OR WHEEZING 18 g 3   aspirin EC 325 MG tablet Take 162.5 mg by mouth every 6 (six) hours as needed (chest pain).     baclofen (LIORESAL) 10 MG tablet TAKE 1 TABLET BY MOUTH THREE TIMES A DAY (Patient taking differently: Take 10 mg by mouth 3 (three) times daily as needed for muscle spasms.) 30 tablet 3   betamethasone dipropionate 0.05 % cream APPLY TO AFFECTED AREA TWICE A DAY (Patient taking differently: Apply 1 application  topically daily as needed (itching).) 45 g 3   buprenorphine (SUBUTEX) 8 MG SUBL SL tablet Place 8 mg under the tongue every 8 (eight) hours.     busPIRone (BUSPAR) 10 MG tablet TAKE 1 TABLET BY MOUTH THREE TIMES A DAY (Patient taking differently: Take 10 mg by mouth 3 (three) times daily as needed (panic attacks).) 90 tablet 3   busPIRone (BUSPAR) 10 MG tablet Take 1 tablet by mouth 3 (three) times daily.     calcipotriene (DOVONOX) 0.005 % ointment APPLY TO THE AFFECTED AREA(S) TOPICALLY TWICE DAILY     carvedilol (COREG) 12.5 MG tablet Take 1 tablet (12.5 mg total) by mouth 2 (two) times daily with a meal. 180 tablet 3   clobetasol (TEMOVATE) 0.05 % external solution Apply topically.     cyanocobalamin (V-R VITAMIN B-12) 500 MCG tablet Take 1 tablet (500 mcg total) by mouth every Monday, Wednesday, and Friday. 45 tablet 3   diclofenac Sodium (  VOLTAREN) 1 % GEL Apply 2 g topically.     diltiazem (CARDIZEM) 90 MG tablet Take 1 capsule by mouth daily.     doxepin (SINEQUAN) 10 MG capsule Take 10 mg by mouth at bedtime.     ezetimibe (ZETIA) 10 MG tablet Take 1 tablet (10 mg total) by mouth daily. 30 tablet 5   ferrous sulfate 325 (65 FE) MG EC tablet Take 1 tablet (325 mg total) by mouth daily with breakfast. 90 tablet 3   fluticasone (FLONASE) 50 MCG/ACT nasal spray USE 2 SPRAYS IN EACH NOSTRIL EVERY DAY AS NEEDED (Patient taking differently: Place 2 sprays into both nostrils  daily as needed for allergies.) 48 mL 1   folic acid (FOLVITE) 800 MCG tablet Take 1 tablet (800 mcg total) by mouth daily. 30 tablet 11   furosemide (LASIX) 20 MG tablet Take 20 mg by mouth daily.     gabapentin (NEURONTIN) 100 MG capsule Take 100 mg by mouth.     ipratropium (ATROVENT) 0.02 % nebulizer solution Take 2.5 mLs (0.5 mg total) by nebulization every 6 (six) hours as needed for wheezing or shortness of breath. 75 mL 0   lactulose (CONSTULOSE) 10 GM/15ML solution TAKE 30 ML BY MOUTH TWICE DAILY 1892 mL 2   lidocaine (XYLOCAINE) 2 % jelly Apply topically.     lidocaine (XYLOCAINE) 5 % ointment Apply 1 application topically as needed. 35.44 g 0   Milk Thistle 1000 MG CAPS Take 1,000 mg by mouth in the morning, at noon, and at bedtime.     Multiple Vitamins-Minerals (MULTI COMPLETE PO) Take 1 tablet by mouth daily.      OneTouch Delica Lancets 30G MISC Test BS QID Dx E11.9 400 each 3   oxyCODONE (OXY IR/ROXICODONE) 5 MG immediate release tablet Take 5 mg by mouth every 6 (six) hours as needed for severe pain.     pantoprazole (PROTONIX) 40 MG tablet Take 1 tablet (40 mg total) by mouth 2 (two) times daily. 180 tablet 3   pravastatin (PRAVACHOL) 10 MG tablet TAKE 1 TABLET BY MOUTH DAILY 90 tablet 0   sildenafil (VIAGRA) 100 MG tablet Take 100 mg by mouth.     spironolactone (ALDACTONE) 50 MG tablet Take 1 tablet (50 mg total) by mouth daily. 30 tablet 6   triamcinolone cream (KENALOG) 0.1 % Apply topically.     No facility-administered medications prior to visit.    Past Medical History:  Diagnosis Date   Anxiety    Atrial fibrillation (HCC)    Blood clot in vein    blood clot in portal vein   CHF (congestive heart failure) (HCC)    Cirrhosis (HCC)    NASH   Constipation    COPD (chronic obstructive pulmonary disease) (HCC)    Dysrhythmia    GERD (gastroesophageal reflux disease)    Gout    Leukopenia 07/08/2015   Neuromuscular disorder (HCC)    neuropathy in hands and  feet   Psoriasis    RA (rheumatoid arthritis) (HCC)    Sleep apnea    cpap used- level 10 and greater   Thrombocytopenia (HCC)       Objective:     There were no vitals taken for this visit.         Assessment   Assessment & Plan      There are no Patient Instructions on file for this visit.   Ozell America, MD 08/22/2023

## 2023-08-23 ENCOUNTER — Encounter: Admitting: Internal Medicine

## 2023-08-23 DIAGNOSIS — I81 Portal vein thrombosis: Secondary | ICD-10-CM | POA: Diagnosis not present

## 2023-08-23 DIAGNOSIS — I4891 Unspecified atrial fibrillation: Secondary | ICD-10-CM | POA: Diagnosis not present

## 2023-08-23 DIAGNOSIS — I5032 Chronic diastolic (congestive) heart failure: Secondary | ICD-10-CM | POA: Diagnosis not present

## 2023-08-23 DIAGNOSIS — G4733 Obstructive sleep apnea (adult) (pediatric): Secondary | ICD-10-CM | POA: Diagnosis not present

## 2023-08-23 DIAGNOSIS — J449 Chronic obstructive pulmonary disease, unspecified: Secondary | ICD-10-CM | POA: Diagnosis not present

## 2023-08-23 DIAGNOSIS — R161 Splenomegaly, not elsewhere classified: Secondary | ICD-10-CM | POA: Diagnosis not present

## 2023-08-23 DIAGNOSIS — M48061 Spinal stenosis, lumbar region without neurogenic claudication: Secondary | ICD-10-CM | POA: Diagnosis not present

## 2023-08-23 DIAGNOSIS — K55069 Acute infarction of intestine, part and extent unspecified: Secondary | ICD-10-CM | POA: Diagnosis not present

## 2023-08-24 DIAGNOSIS — I81 Portal vein thrombosis: Secondary | ICD-10-CM | POA: Diagnosis not present

## 2023-08-24 DIAGNOSIS — J449 Chronic obstructive pulmonary disease, unspecified: Secondary | ICD-10-CM | POA: Diagnosis not present

## 2023-08-24 DIAGNOSIS — M48061 Spinal stenosis, lumbar region without neurogenic claudication: Secondary | ICD-10-CM | POA: Diagnosis not present

## 2023-08-24 DIAGNOSIS — R161 Splenomegaly, not elsewhere classified: Secondary | ICD-10-CM | POA: Diagnosis not present

## 2023-08-24 DIAGNOSIS — I5032 Chronic diastolic (congestive) heart failure: Secondary | ICD-10-CM | POA: Diagnosis not present

## 2023-08-24 DIAGNOSIS — G4733 Obstructive sleep apnea (adult) (pediatric): Secondary | ICD-10-CM | POA: Diagnosis not present

## 2023-08-24 DIAGNOSIS — I4891 Unspecified atrial fibrillation: Secondary | ICD-10-CM | POA: Diagnosis not present

## 2023-08-24 DIAGNOSIS — K55069 Acute infarction of intestine, part and extent unspecified: Secondary | ICD-10-CM | POA: Diagnosis not present

## 2023-08-25 DIAGNOSIS — M48061 Spinal stenosis, lumbar region without neurogenic claudication: Secondary | ICD-10-CM | POA: Diagnosis not present

## 2023-08-25 DIAGNOSIS — I5032 Chronic diastolic (congestive) heart failure: Secondary | ICD-10-CM | POA: Diagnosis not present

## 2023-08-25 DIAGNOSIS — J449 Chronic obstructive pulmonary disease, unspecified: Secondary | ICD-10-CM | POA: Diagnosis not present

## 2023-08-25 DIAGNOSIS — N1330 Unspecified hydronephrosis: Secondary | ICD-10-CM | POA: Diagnosis not present

## 2023-08-25 DIAGNOSIS — G4733 Obstructive sleep apnea (adult) (pediatric): Secondary | ICD-10-CM | POA: Diagnosis not present

## 2023-08-25 DIAGNOSIS — I4891 Unspecified atrial fibrillation: Secondary | ICD-10-CM | POA: Diagnosis not present

## 2023-08-25 DIAGNOSIS — I81 Portal vein thrombosis: Secondary | ICD-10-CM | POA: Diagnosis not present

## 2023-08-25 DIAGNOSIS — N179 Acute kidney failure, unspecified: Secondary | ICD-10-CM | POA: Diagnosis not present

## 2023-08-25 DIAGNOSIS — R161 Splenomegaly, not elsewhere classified: Secondary | ICD-10-CM | POA: Diagnosis not present

## 2023-08-25 DIAGNOSIS — K55069 Acute infarction of intestine, part and extent unspecified: Secondary | ICD-10-CM | POA: Diagnosis not present

## 2023-08-26 DIAGNOSIS — I81 Portal vein thrombosis: Secondary | ICD-10-CM | POA: Diagnosis not present

## 2023-08-26 DIAGNOSIS — G4733 Obstructive sleep apnea (adult) (pediatric): Secondary | ICD-10-CM | POA: Diagnosis not present

## 2023-08-26 DIAGNOSIS — K55069 Acute infarction of intestine, part and extent unspecified: Secondary | ICD-10-CM | POA: Diagnosis not present

## 2023-08-26 DIAGNOSIS — M48061 Spinal stenosis, lumbar region without neurogenic claudication: Secondary | ICD-10-CM | POA: Diagnosis not present

## 2023-08-26 DIAGNOSIS — Z9049 Acquired absence of other specified parts of digestive tract: Secondary | ICD-10-CM | POA: Diagnosis not present

## 2023-08-26 DIAGNOSIS — R161 Splenomegaly, not elsewhere classified: Secondary | ICD-10-CM | POA: Diagnosis not present

## 2023-08-26 DIAGNOSIS — K6389 Other specified diseases of intestine: Secondary | ICD-10-CM | POA: Diagnosis not present

## 2023-08-26 DIAGNOSIS — I4891 Unspecified atrial fibrillation: Secondary | ICD-10-CM | POA: Diagnosis not present

## 2023-08-26 DIAGNOSIS — J449 Chronic obstructive pulmonary disease, unspecified: Secondary | ICD-10-CM | POA: Diagnosis not present

## 2023-08-26 DIAGNOSIS — R188 Other ascites: Secondary | ICD-10-CM | POA: Diagnosis not present

## 2023-08-26 DIAGNOSIS — K7460 Unspecified cirrhosis of liver: Secondary | ICD-10-CM | POA: Diagnosis not present

## 2023-08-26 DIAGNOSIS — I5032 Chronic diastolic (congestive) heart failure: Secondary | ICD-10-CM | POA: Diagnosis not present

## 2023-08-27 DIAGNOSIS — K7290 Hepatic failure, unspecified without coma: Secondary | ICD-10-CM | POA: Diagnosis not present

## 2023-08-27 DIAGNOSIS — K7460 Unspecified cirrhosis of liver: Secondary | ICD-10-CM | POA: Diagnosis not present

## 2023-08-28 DIAGNOSIS — K7290 Hepatic failure, unspecified without coma: Secondary | ICD-10-CM | POA: Diagnosis not present

## 2023-08-28 DIAGNOSIS — K7460 Unspecified cirrhosis of liver: Secondary | ICD-10-CM | POA: Diagnosis not present

## 2023-08-29 DIAGNOSIS — R1084 Generalized abdominal pain: Secondary | ICD-10-CM | POA: Diagnosis not present

## 2023-08-30 DIAGNOSIS — R1084 Generalized abdominal pain: Secondary | ICD-10-CM | POA: Diagnosis not present

## 2023-09-04 ENCOUNTER — Other Ambulatory Visit (INDEPENDENT_AMBULATORY_CARE_PROVIDER_SITE_OTHER): Payer: Self-pay | Admitting: Gastroenterology

## 2023-09-04 DIAGNOSIS — J449 Chronic obstructive pulmonary disease, unspecified: Secondary | ICD-10-CM | POA: Diagnosis not present

## 2023-09-04 DIAGNOSIS — G4733 Obstructive sleep apnea (adult) (pediatric): Secondary | ICD-10-CM | POA: Diagnosis not present

## 2023-09-04 NOTE — Telephone Encounter (Signed)
 Can you call this patient and set up an appt for further refills. Thank you!

## 2023-09-04 NOTE — Telephone Encounter (Signed)
Will refill medication for 3 months, needs follow up appointment with any provider in order to receive any refills.  Thanks,  Markhi Kleckner Castaneda, MD Gastroenterology and Hepatology [facility]  

## 2023-09-05 DIAGNOSIS — I5032 Chronic diastolic (congestive) heart failure: Secondary | ICD-10-CM | POA: Diagnosis not present

## 2023-09-05 DIAGNOSIS — I8500 Esophageal varices without bleeding: Secondary | ICD-10-CM | POA: Diagnosis not present

## 2023-09-05 DIAGNOSIS — E1159 Type 2 diabetes mellitus with other circulatory complications: Secondary | ICD-10-CM | POA: Diagnosis not present

## 2023-09-05 DIAGNOSIS — K766 Portal hypertension: Secondary | ICD-10-CM | POA: Diagnosis not present

## 2023-09-05 DIAGNOSIS — I152 Hypertension secondary to endocrine disorders: Secondary | ICD-10-CM | POA: Diagnosis not present

## 2023-09-05 DIAGNOSIS — I10 Essential (primary) hypertension: Secondary | ICD-10-CM | POA: Diagnosis not present

## 2023-09-05 DIAGNOSIS — E785 Hyperlipidemia, unspecified: Secondary | ICD-10-CM | POA: Diagnosis not present

## 2023-09-05 DIAGNOSIS — I480 Paroxysmal atrial fibrillation: Secondary | ICD-10-CM | POA: Diagnosis not present

## 2023-09-05 DIAGNOSIS — I2089 Other forms of angina pectoris: Secondary | ICD-10-CM | POA: Diagnosis not present

## 2023-09-10 ENCOUNTER — Encounter (INDEPENDENT_AMBULATORY_CARE_PROVIDER_SITE_OTHER): Payer: Self-pay | Admitting: Gastroenterology

## 2023-09-10 NOTE — Telephone Encounter (Signed)
 noted

## 2023-09-11 DIAGNOSIS — M1990 Unspecified osteoarthritis, unspecified site: Secondary | ICD-10-CM | POA: Diagnosis not present

## 2023-09-11 DIAGNOSIS — I480 Paroxysmal atrial fibrillation: Secondary | ICD-10-CM | POA: Diagnosis not present

## 2023-09-11 DIAGNOSIS — I82890 Acute embolism and thrombosis of other specified veins: Secondary | ICD-10-CM | POA: Diagnosis not present

## 2023-09-11 DIAGNOSIS — J449 Chronic obstructive pulmonary disease, unspecified: Secondary | ICD-10-CM | POA: Diagnosis not present

## 2023-09-11 DIAGNOSIS — Z789 Other specified health status: Secondary | ICD-10-CM | POA: Diagnosis not present

## 2023-09-11 DIAGNOSIS — D696 Thrombocytopenia, unspecified: Secondary | ICD-10-CM | POA: Diagnosis not present

## 2023-09-11 DIAGNOSIS — R4781 Slurred speech: Secondary | ICD-10-CM | POA: Diagnosis not present

## 2023-09-11 DIAGNOSIS — I5030 Unspecified diastolic (congestive) heart failure: Secondary | ICD-10-CM | POA: Diagnosis not present

## 2023-09-11 DIAGNOSIS — E768 Other disorders of glucosaminoglycan metabolism: Secondary | ICD-10-CM | POA: Diagnosis not present

## 2023-09-11 DIAGNOSIS — I959 Hypotension, unspecified: Secondary | ICD-10-CM | POA: Diagnosis not present

## 2023-09-11 DIAGNOSIS — I5032 Chronic diastolic (congestive) heart failure: Secondary | ICD-10-CM | POA: Diagnosis not present

## 2023-09-11 DIAGNOSIS — R278 Other lack of coordination: Secondary | ICD-10-CM | POA: Diagnosis not present

## 2023-09-11 DIAGNOSIS — R0989 Other specified symptoms and signs involving the circulatory and respiratory systems: Secondary | ICD-10-CM | POA: Diagnosis not present

## 2023-09-11 DIAGNOSIS — R7303 Prediabetes: Secondary | ICD-10-CM | POA: Diagnosis not present

## 2023-09-11 DIAGNOSIS — G4733 Obstructive sleep apnea (adult) (pediatric): Secondary | ICD-10-CM | POA: Diagnosis not present

## 2023-09-11 DIAGNOSIS — R296 Repeated falls: Secondary | ICD-10-CM | POA: Diagnosis not present

## 2023-09-11 DIAGNOSIS — J4489 Other specified chronic obstructive pulmonary disease: Secondary | ICD-10-CM | POA: Diagnosis not present

## 2023-09-11 DIAGNOSIS — M810 Age-related osteoporosis without current pathological fracture: Secondary | ICD-10-CM | POA: Diagnosis not present

## 2023-09-11 DIAGNOSIS — E871 Hypo-osmolality and hyponatremia: Secondary | ICD-10-CM | POA: Diagnosis not present

## 2023-09-11 DIAGNOSIS — E876 Hypokalemia: Secondary | ICD-10-CM | POA: Diagnosis not present

## 2023-09-11 DIAGNOSIS — L309 Dermatitis, unspecified: Secondary | ICD-10-CM | POA: Diagnosis not present

## 2023-09-11 DIAGNOSIS — I81 Portal vein thrombosis: Secondary | ICD-10-CM | POA: Diagnosis not present

## 2023-09-11 DIAGNOSIS — M6281 Muscle weakness (generalized): Secondary | ICD-10-CM | POA: Diagnosis not present

## 2023-09-11 DIAGNOSIS — G8929 Other chronic pain: Secondary | ICD-10-CM | POA: Diagnosis not present

## 2023-09-11 DIAGNOSIS — I8510 Secondary esophageal varices without bleeding: Secondary | ICD-10-CM | POA: Diagnosis not present

## 2023-09-11 DIAGNOSIS — K7682 Hepatic encephalopathy: Secondary | ICD-10-CM | POA: Diagnosis not present

## 2023-09-11 DIAGNOSIS — K766 Portal hypertension: Secondary | ICD-10-CM | POA: Diagnosis not present

## 2023-09-11 DIAGNOSIS — K55069 Acute infarction of intestine, part and extent unspecified: Secondary | ICD-10-CM | POA: Diagnosis not present

## 2023-09-11 DIAGNOSIS — L409 Psoriasis, unspecified: Secondary | ICD-10-CM | POA: Diagnosis not present

## 2023-09-11 DIAGNOSIS — R601 Generalized edema: Secondary | ICD-10-CM | POA: Diagnosis not present

## 2023-09-11 DIAGNOSIS — E8770 Fluid overload, unspecified: Secondary | ICD-10-CM | POA: Diagnosis not present

## 2023-09-11 NOTE — H&P (Addendum)
 History and Physical [facility] [name]   09/11/23    Patient name: [name] DOB 1965/09/28 MRN#: [id number] PCP: Marlee Lynwood Benders, MD Time: 5:49 PM Primary Care Provider:  Marlee Lynwood Benders, MD Inpatient primary attending provider: Margart Elsie Dragon, DO  _________________________________________________________________________  Admission HPI   Patient admitted on: 09/11/2023  2:10 PM  Patient admitted by: Margart Elsie Dragon, DO   CHIEF COMPLAINT: Confusion, testicular swelling, edema  Day of admission HPI:  [name]  is a 58 y.o. male with a PMH significant for HFpEF, paroxysmal A-fib, hyperlipidemia, hypertension, cirrhosis, polysubstance abuse, left hydrocele/scrotal edema who presented with increased testicular swelling and confusion.  Patient presented to the ED as a code stroke however upon further evaluation by ED provider and myself symptoms felt to be more related to hepatic encephalopathy.  CT head without any acute abnormality, patient able to move all extremities although globally weak, no slurred speech appreciated, does have some disorganized thought process and notable confusion.  He reports his main issue with his confusion was that he was unable to figure out how to use his phone.  He states that this is unusual for him.  He also reports an increase in the amount of swelling in his testicles causing discomfort.  He reports that he does have lactulose  at home but he has not been having regular bowel movements.  Patient tells me that at home recently he has not been walking due to the increased edema.  We have discussed hospitalization, diuretics, electrolyte monitoring, and lactulose  to improve confusion.  Patient agreeable with treatment plan at this time.  Of note patient recently hospitalized at [facility] from 08/18/2023 through 08/30/2023.  He was transferred for portal, splenic, and superior mesenteric venous  thrombosis.  Patient admitted on Home O2? - no Patient on home anticoagulant? -  no Patient admitted with Chronic home foley catheter? - no Foley catheter placed or replaced by another service prior to admission? - no Central Line Status: NONE  Mental Status on Admission: The patient is Alert and oriented to PERSON The patient is Alert And oriented to TIME The patient is Alert and oriented to LOCATION  Problem List, Assessment & Plan    ASSESSMENT & PLAN (In order of descending acuity)  Hepatic encephalopathy/hepatic cirrhosis - Due to noncompliance with lactulose  and inconsistent bowel movements, and chronic alcohol use (reports stopped approximately 3 weeks ago) - Ammonia level greater than 100 on arrival, likely contributing to patient's underlying confusion although he is alert and oriented x 3 he is having difficulty performing simple tasks that he is used to performing independently - Continue lactulose  3 times daily with goal of 2-3 bowel movements per day - Recheck ammonia level in the morning  Testicular edema/anasarca - Due to significant testicular edema (in the setting of known hydrocele) will order CT abdomen pelvis with contrast to be performed to rule out any underlying process - Attempt diuresis with torsemide , patient without any shortness of breath or pulmonary edema, saturating well on room air with no increased respiratory effort - Monitor fluid status and electrolytes while on torsemide   Hyponatremia - Thought to be due to volume overload - Monitor with torsemide  diuresis - Repeat labs in the morning  History of portal vein, splenic vein, and superior mesenteric vein thrombosis - Recent hospitalization at [facility] - Had ultrasound-guided paracentesis on 8/11 with 280 cc of clear/yellow fluid, EGD performed 8/12 with grade 1 esophageal varices without any bleeding - GI recommended  against anticoagulation given severe thrombocytopenia and presence of varices -  They recommended repeat imaging in 1 month to reassess clot burden and repeat EGD in 1 year - Continue diuretic and Aldactone  - Patient will need repeat visit with GI following hospitalization in about 1 month  Atrial fibrillation, paroxysmal - Currently rate controlled, on Cardizem  and metoprolol  - Continue rate controlling medications in the absence of anticoagulation due to significant bleeding risk  Chronic thrombocytopenia - Patient with platelets ranging between 30,000 and 50,000, currently 44 - Subspecialties have recommended against anticoagulation given advanced cirrhosis and presence of esophageal varices, bleeding risk outweighs benefit  Morbid obesity - Complicates all aspects of care, BMI 52 - Recommend dietary and lifestyle modifications   Incidental Findings for ourpatient Follow-Up: No significant incidental findings present   ADDITIONAL NON-ACUTE FINDINGS, OBSERVATIONS, FAMILY DISCUSSIONS, ETC. (When present):  Alert and oriented morbidly obese 58 year old male seen and evaluated in ER stretcher Saturating well on room air Mildly confused however answers all orientation questions appropriately, has difficulty performing simple tasks such as operating cell phone Lungs clear to auscultation bilaterally, no increased work of breathing Heart atrial fibrillation with controlled rate Abdomen protuberant, anasarca GU with grossly edematous testicles Lower extremities with bilateral nonpitting edema, and skin changes consistent with chronic edema  DVT Prophylaxis Ordered: None, high bleeding risk __________________________________________________________________________  Temp:  [36.5 C (97.7 F)] 36.5 C (97.7 F) Pulse:  [79] 79 Resp:  [20] 20 BP: (138)/(71) 138/71 SpO2:  [94 %] 94 % There is no height or weight on file to calculate BMI. Intake/Output last 3 shifts: No intake/output data recorded.  Consults Requested  None     In hospital Nutrition: No diet  orders on file    An advanced care planning discussion was  had with patient and/or patient's decisions maker (documented separately).  CODE STATUS :                    Full Code   Given this patient's known comorbid illnesses and condition Present on Admission, plan of care includes acute interventions of management of hepatic encephalopathy with lactulose  and titration depending on bowel movements, diuresis due to global anasarca, and electrolyte monitoring as noted in the Problem List, Assessment & Plan noted above.  I fully expect, with this information in hand, that this patient will require at least two medically necessary midnights of hospital care prior to a safe discharge.   The patient's hospital stay is complicated by the above clinically significant conditions, as documented in Assessment and Plan, which are present on admission and requiring additional evaluation and treatment or having a significant effect of this patient's care.   __________________________________________________________  Allergies  Allergen Reactions  . Allopurinol    . Allopurinol  Analogues   . Fluoxetine Itching  . Fluoxetine Hcl Itching     Past Medical History[1]  Past Surgical History[2]   Family History[3]       Current Medications[4]  REFER TO EPIC FOR FULL LIST OF CURRENT MEDICATIONS ORDERED ON ADMISSION. THESE ORDERS APPEAR ONLY WHEN RELEASED, WHICH MAY HAPPEN AFTER ADMISSION ONCE PATIENT IS TRANSFERRED FROM THE ED.  Allergies  Allergies[5]  Imaging  CT Head Wo Contrast Result Date: 09/11/2023 Exam:  CT Head without Contrast  History:  Altered mental status  Technique: Routine brain CT without IV contrast. AEC (automated exposure control) and/or manual techniques such as size-specific kV and mAs are employed where appropriate to reduce radiation exposure for all CT exams.  Comparison:  08/30/2020  Findings:   BRAIN:  No CT evidence of acute infarction, hemorrhage, edema, mass or mass  effect. Ventricles and basilar cisterns are unremarkable.  SOFT TISSUES:  Negative. CALVARIUM:  Negative. No fracture. SINUSES AND MASTOIDS:  No mucosal thickening or fluid.     -- No acute intracranial abnormality.  Signed (Electronic Signature): 09/11/2023 3:47 PM Signed By: Alyce Bellman, MD  XR Chest Portable Result Date: 09/11/2023 Exam:  Portable Chest  History:  Altered mental status.  Technique:  Single frontal view.  Comparison:  Radiographs and CT, 08/17/2023.  Findings:   Normal cardiac mediastinal silhouette. The lung volumes are low. No focal consolidation, large pleural effusion, or pneumothorax. No suspicious osseous lesions.    No acute cardiopulmonary abnormality.  Signed (Electronic Signature): 09/11/2023 2:43 PM Signed By: Thayer Kitty, MD   Lab Results   Recent Labs    09/11/23 1404  WBC 7.2  HGB 13.5  HCT 37.2  PLT 44*   Recent Labs    09/11/23 1404  NA 127*  K 3.3*  CL 93*  CO2 23.5  BUN 16  CREATININE 1.37*  GLU 117  CALCIUM 7.8*  ALBUMIN 2.2*  PROT 6.2  BILITOT 4.1*  AST 62*  ALT 30  ALKPHOS 99  MG 1.2*  AMMONIA 107*   Recent Labs    09/11/23 1404  INR 1.85  APTT 33.9   No results for input(s): WBCUA, NITRITE, LEUKOCYTESUR, BACTERIA, RBCUA, BLOODU, GLUCOSEU, PROTEINUA, KETONESU, KETUR in the last 72 hours. Recent Labs    09/11/23 1515  ETOH <3   No results for input(s): PREGTESTUR, PREGPOC in the last 72 hours. No results for input(s): OCCULTBLD, RAPSCRN, CDIFRPCR, CDIFFNAP1, A1C, CHOL, LDL, HDL, TRIG in the last 72 hours. No results for input(s): O2SOUR, FIO2ART, PHART, PCO2ART, PO2ART, HCO3ART, O2SATART, BEART in the last 72 hours.   Home Medications   Prior to Admission medications  Medication Dose, Route, Frequency  albuterol  HFA 90 mcg/actuation inhaler 2 puffs, Every 6 hours PRN  aspirin  325 MG delayed release (EC) tablet 162.5 mg, Daily PRN  baclofen  (LIORESAL ) 10 MG  tablet 10 mg, At bedtime Patient not taking: Reported on 09/05/2023  betamethasone  dipropionate 0.05 % cream Topical, 2 times a day (standard)  buprenorphine  HCL (SUBUTEX ) 8 mg tablet 1 tablet, 2 times a day (standard)  busPIRone  (BUSPAR ) 15 MG tablet 15 mg, Oral, 3 times a day (standard)  calcipotriene (DOVONOX) 0.005 % ointment Topical, 2 times a day (standard)  cholecalciferol, vitamin D3-250 mcg, 10,000 unit,, 250 mcg (10,000 unit) capsule 250 mcg, Oral, Daily (standard)  clobetasol (TEMOVATE) 0.05 % external solution Topical, 2 times a day (standard), To scalp psoriasis spots until healed  CONSTULOSE  10 gram/15 mL solution 10 g, Oral, 2 times a day (standard)  cyanocobalamin  500 MCG tablet 1 tablet, Daily (standard)  diazePAM (VALIUM) 5 MG tablet Take 30 to 45 minutes prior to testing, do not drive on medication Patient not taking: Reported on 09/05/2023  dilTIAZem  (CARDIZEM  CD) 240 MG 24 hr capsule 240 mg, Oral, Daily (standard) Patient taking differently: Take 180 mg by mouth daily.  dilTIAZem  (CARDIZEM  CD) 240 MG 24 hr capsule 240 mg, Oral, Daily (standard)  DULoxetine  (CYMBALTA ) 60 MG capsule 120 mg, Oral, Daily (standard), Patient unsure if he's taking this medication.  ezetimibe  (ZETIA ) 10 mg tablet 10 mg, Oral, Daily (standard)  ferrous sulfate  325 (65 FE) MG EC tablet 1 tablet, Daily before breakfast Patient not taking: Reported on 09/05/2023  fluticasone  propionate (FLONASE ) 50 mcg/actuation nasal spray  1 spray, Each Nare  folic acid  (FOLVITE ) 800 MCG tablet 800 mcg, Oral, Daily (standard)  furosemide  (LASIX ) 20 MG tablet 20 mg, Oral, Daily (standard)  gabapentin  (NEURONTIN ) 100 MG capsule 100 mg, 3 times a day (standard)  ipratropium (ATROVENT ) 0.02 % nebulizer solution 500 mcg, Nebulization, 4 times a day Patient not taking: Reported on 09/05/2023  lidocaine  (LIDODERM ) 5 % patch apply 2 patches onto THE SKIN DAILY (12 HOURS ON AND 12 HOURS off) Patient not taking: Reported on  09/05/2023  metoPROLOL  tartrate (LOPRESSOR ) 50 MG tablet 25 mg, Oral, 2 times a day (standard) Patient taking differently: Take 2 tablets (100 mg total) by mouth two (2) times a day.  miscellaneous medical supply Misc 1 Box., Miscellaneous, 2 times a day PRN  miscellaneous medical supply Misc Powered wheel chair  miscellaneous medical supply Misc Blood pressure monitor with large cuff  omeprazole (PRILOSEC) 20 MG capsule 20 mg, Daily (standard)  pantoprazole  (PROTONIX ) 40 MG tablet 40 mg, Oral, Every morning  pravastatin  (PRAVACHOL ) 10 MG tablet TAKE 1 TABLET BY MOUTH DAILY  sildenafil  (VIAGRA ) 100 MG tablet 100 mg, Oral, Daily PRN  spironolactone  (ALDACTONE ) 50 MG tablet 50 mg, Oral, Daily (standard)  testosterone  1 % (50 mg/5 gram) gel packet 50 mg, Transdermal, Daily (standard)  torsemide  (DEMADEX ) 10 MG tablet 10 mg, Oral, Daily (standard) Patient not taking: Reported on 09/05/2023  triamcinolone  (KENALOG ) 0.1 % cream Topical, 2 times a day (standard), To psoriasis spots until healed  VITAMIN B-1 100 mg tablet 100 mg, Oral, Every morning   Margart Dragon, DO Hospitalist, [facility] 09/11/23, 5:49 PM        [1] Past Medical History: Diagnosis Date  . Asthma (HHS-HCC)   . CHF (congestive heart failure)    (CMS-HCC)   . Cirrhosis of liver    (CMS-HCC)   . COPD (chronic obstructive pulmonary disease)    (CMS-HCC)   . Diverticulitis   . Osteoarthrosis   . Psoriasis   [2] Past Surgical History: Procedure Laterality Date  . CHOLECYSTECTOMY    . COLONOSCOPY    . HERNIA REPAIR     umbilical  . JOINT REPLACEMENT    . TONSILLECTOMY    [3] Family History Problem Relation Age of Onset  . No Known Problems Mother   . No Known Problems Father   . No Known Problems Sister   . Heart disease Sister   . No Known Problems Daughter   [4]  Current Facility-Administered Medications:  .  sodium chloride  0.9% (NS) bolus 1,000 mL, 1,000 mL, Intravenous, Once, Crihfield, Lori, DO, Held at  09/11/23 1520 .  torsemide  (DEMADEX ) tablet 40 mg, 40 mg, Oral, Daily, Crihfield, Lori, DO, 40 mg at 09/11/23 1732  Current Outpatient Medications:  .  albuterol  HFA 90 mcg/actuation inhaler, Inhale 2 puffs every six (6) hours as needed for wheezing or shortness of breath., Disp: , Rfl:  .  aspirin  325 MG delayed release (EC) tablet, Take 162.5 mg by mouth daily as needed., Disp: , Rfl:  .  baclofen  (LIORESAL ) 10 MG tablet, Take 1 tablet (10 mg total) by mouth at bedtime. (Patient not taking: Reported on 09/05/2023), Disp: , Rfl:  .  betamethasone  dipropionate 0.05 % cream, Apply topically two (2) times a day., Disp: 45 g, Rfl: 3 .  buprenorphine  HCL (SUBUTEX ) 8 mg tablet, Place 1 tablet (8 mg total) under the tongue two (2) times a day., Disp: , Rfl:  .  busPIRone  (BUSPAR ) 15 MG tablet, Take 1 tablet (  15 mg total) by mouth Three (3) times a day., Disp: 270 tablet, Rfl: 1 .  calcipotriene (DOVONOX) 0.005 % ointment, Apply topically two (2) times a day., Disp: 120 g, Rfl: 1 .  cholecalciferol, vitamin D3-250 mcg, 10,000 unit,, 250 mcg (10,000 unit) capsule, Take 1 capsule (250 mcg total) by mouth daily., Disp: 90 capsule, Rfl: 1 .  clobetasol (TEMOVATE) 0.05 % external solution, Apply topically two (2) times a day. To scalp psoriasis spots until healed, Disp: 50 mL, Rfl: 2 .  CONSTULOSE  10 gram/15 mL solution, Take 15 mL (10 g total) by mouth two (2) times a day., Disp: 240 mL, Rfl: 0 .  cyanocobalamin  500 MCG tablet, Take 1 tablet (500 mcg total) by mouth daily., Disp: , Rfl:  .  diazePAM (VALIUM) 5 MG tablet, Take 30 to 45 minutes prior to testing, do not drive on medication (Patient not taking: Reported on 09/05/2023), Disp: 1 tablet, Rfl: 0 .  dilTIAZem  (CARDIZEM  CD) 240 MG 24 hr capsule, Take 1 capsule (240 mg total) by mouth daily. (Patient taking differently: Take 180 mg by mouth daily.), Disp: 30 capsule, Rfl: 6 .  dilTIAZem  (CARDIZEM  CD) 240 MG 24 hr capsule, Take 1 capsule (240 mg total) by  mouth daily., Disp: 100 capsule, Rfl: 3 .  DULoxetine  (CYMBALTA ) 60 MG capsule, Take 2 capsules (120 mg total) by mouth before bedtime. Patient unsure if he's taking this medication., Disp: 180 capsule, Rfl: 1 .  ezetimibe  (ZETIA ) 10 mg tablet, TAKE 1 TABLET BY MOUTH DAILY, Disp: 90 tablet, Rfl: 3 .  ferrous sulfate  325 (65 FE) MG EC tablet, Take 1 tablet (325 mg total) by mouth daily before breakfast. (Patient not taking: Reported on 09/05/2023), Disp: , Rfl:  .  fluticasone  propionate (FLONASE ) 50 mcg/actuation nasal spray, USE 1 SPRAY IN EACH NOSTRIL DAILY, Disp: 16 g, Rfl: 2 .  folic acid  (FOLVITE ) 800 MCG tablet, Take 1 tablet (800 mcg total) by mouth daily., Disp: 30 tablet, Rfl: 11 .  furosemide  (LASIX ) 20 MG tablet, TAKE 1 TABLET BY MOUTH DAILY, Disp: 30 tablet, Rfl: 5 .  gabapentin  (NEURONTIN ) 100 MG capsule, Take 1 capsule (100 mg total) by mouth Three (3) times a day. Prescribed by [facility], Disp: , Rfl:  .  ipratropium (ATROVENT ) 0.02 % nebulizer solution, Inhale 2.5 mL (500 mcg total) by nebulization four (4) times a day. (Patient not taking: Reported on 09/05/2023), Disp: 75 mL, Rfl: 6 .  lidocaine  (LIDODERM ) 5 % patch, apply 2 patches onto THE SKIN DAILY (12 HOURS ON AND 12 HOURS off) (Patient not taking: Reported on 09/05/2023), Disp: , Rfl:  .  metoPROLOL  tartrate (LOPRESSOR ) 50 MG tablet, Take 0.5 tablets (25 mg total) by mouth two (2) times a day. (Patient taking differently: Take 2 tablets (100 mg total) by mouth two (2) times a day.), Disp: 100 tablet, Rfl: 3 .  miscellaneous medical supply Misc, 1 Box by Miscellaneous route two (2) times a day as needed., Disp: 1 each, Rfl: 0 .  miscellaneous medical supply Misc, Powered wheel chair, Disp: 1 each, Rfl: 0 .  miscellaneous medical supply Misc, Blood pressure monitor with large cuff, Disp: 1 each, Rfl: 0 .  omeprazole (PRILOSEC) 20 MG capsule, Take 1 capsule (20 mg total) by mouth daily., Disp: , Rfl:  .   pantoprazole  (PROTONIX ) 40 MG tablet, TAKE 1 TABLET BY MOUTH EVERY MORNING, Disp: 90 tablet, Rfl: 1 .  pravastatin  (PRAVACHOL ) 10 MG tablet, TAKE 1 TABLET BY MOUTH  DAILY, Disp: 90 tablet, Rfl: 3 .  sildenafil  (VIAGRA ) 100 MG tablet, Take 1 tablet (100 mg total) by mouth daily as needed for erectile dysfunction., Disp: 30 tablet, Rfl: 0 .  spironolactone  (ALDACTONE ) 50 MG tablet, TAKE 1 TABLET BY MOUTH EVERY DAY, Disp: 90 tablet, Rfl: 1 .  testosterone  1 % (50 mg/5 gram) gel packet, Place 1 packet (50 mg of testosterone  total) on the skin daily., Disp: 30 packet, Rfl: 2 .  torsemide  (DEMADEX ) 10 MG tablet, Take 1 tablet (10 mg total) by mouth daily for 10 days. (Patient not taking: Reported on 09/05/2023), Disp: 10 tablet, Rfl: 0 .  triamcinolone  (KENALOG ) 0.1 % cream, Apply topically two (2) times a day. To psoriasis spots until healed, Disp: 454 g, Rfl: 2 .  VITAMIN B-1 100 mg tablet, Take 1 tablet (100 mg total) by mouth every morning., Disp: 90 tablet, Rfl: 0 [5] Allergies Allergen Reactions  . Allopurinol    . Allopurinol  Analogues   . Fluoxetine Itching  . Fluoxetine Hcl Itching

## 2023-09-20 NOTE — Discharge Summary (Signed)
 DISCHARGE SUMMARY [facility]   Discharge date:   September 20, 2023 Length of stay:    LOS: 9 days    Discharge Service:   [facility] Hospitalists Discharge Attending Physician: Margart Elsie Dragon, DO Discharge to:    To Skilled Nursing Facility [facility] Condition at Discharge:  stable Code status:                         Full Code   Hospital Course: [name] is a 58 year old male with a history of heart failure with preserved ejection fraction, paroxysmal atrial fibrillation, alcoholic cirrhosis, portal hypertension with esophageal varices, chronic thrombocytopenia, morbid obesity, and physical deconditioning, who was admitted for confusion, testicular swelling, and anasarca in the setting of hepatic encephalopathy.  PROBLEM-ORIENTED HOSPITAL SUMMARY  Hepatic Encephalopathy and Cirrhosis  He was admitted with confusion and disorganized thought process, found to have an elevated ammonia level (>100) and a history of poor compliance with lactulose . His mental status improved with lactulose  titrated to achieve 2-3 loose bowel movements per day, and ammonia levels decreased to 29 by discharge. He has a history of alcoholic liver disease and alcoholic hepatitis, with recent cessation of alcohol use approximately three weeks prior to admission. CT head was negative for acute intracranial pathology. He remained intermittently confused but was easily redirected and continues with intermittent confusion but no focal neurological deficits.   Volume Overload, Anasarca, Testicular Edema, and Ascites  He presented with significant anasarca, scrotal swelling, and lower extremity edema, in the setting of known hydrocele and cirrhosis. CT abdomen/pelvis showed cirrhosis with portal hypertension, small volume ascites, and large caliber splenic and portal veins with nearly occlusive thrombus, but no acute abdominal pathology. Scrotal ultrasound revealed severe diffuse scrotal wall  edema, moderate left hydrocele, small varicocele, and no testicular mass or torsion. He underwent diuresis with torsemide  and albumin, resulting in a net negative fluid balance and weight decrease from ~180 kg to 168 kg. Edema and swelling improved significantly during hospitalization.   Portal, Splenic, and Mesenteric Vein Thrombosis; Portal Hypertension with Esophageal Varices  He has a history of portal, splenic, and superior mesenteric vein thrombosis, with prior GI recommendations against anticoagulation due to severe thrombocytopenia and grade 1 esophageal varices without bleeding. Repeat imaging and EGD are recommended for outpatient follow-up.   Hyponatremia and Electrolyte Disturbances  Chronic hyponatremia was present, attributed to cirrhosis and volume overload, with sodium levels ranging from 124-130 during admission. He developed hypokalemia and hypomagnesemia during diuresis, which were aggressively repleted throughout the hospitalization.   Atrial Fibrillation  He has a history of paroxysmal atrial fibrillation, which remained rate controlled on diltiazem  and metoprolol  during admission. Anticoagulation was avoided due to high bleeding risk from thrombocytopenia and esophageal varices.   Chronic Thrombocytopenia  Platelet counts ranged from 28-47 during admission, attributed to advanced cirrhosis and possible bone marrow suppression from alcohol use. Anticoagulation was avoided per GI recommendations.   Morbid Obesity  Morbid obesity (BMI up to 55) complicated care and increased risk for morbidity and mortality. Dietary and lifestyle modifications were recommended.   Physical Deconditioning and Functional Decline  He was admitted with significant physical deconditioning and required maximum assistance for bed mobility, transfers, and ADLs. PT and OT recommended skilled nursing facility placement for rehabilitation, with ongoing therapy needs for safety, mobility, and ADL support.    Leukocytosis and Suspected Cellulitis  Leukocytosis (WBC up to 14.5) with left shift and toxic granulation was noted late in the  hospitalization, with concern for right lower extremity cellulitis. He was empirically treated with piperacillin-tazobactam, later de-escalated to oral amoxicillin -clavulanate as leukocytosis resolved and no clear source of infection was identified.   Skin Integrity  He had persistent skin issues including jaundice, bruising, sacral edema, and incontinence-associated dermatitis, with one broken area on the right buttock treated with Allevyn. Pressure reduction and moisture management techniques were implemented throughout the stay.   Pain and Comfort  Chronic pain in the back and lower extremities was reported, but pain was generally well controlled with acetaminophen  and non-pharmacologic interventions, with pain scores frequently at 0.   COPD and Respiratory Status  He has a history of COPD and obstructive sleep apnea, but remained stable on room air throughout the hospitalization, with SpO2 in the low to mid 90s and no need for supplemental oxygen  or NPPV.   Falls  He was at high risk for falls due to deconditioning and anasarca, with multiple episodes of getting out of bed and one documented fall without injury near discharge. Fall prevention strategies were consistently implemented.   Disposition  He was accepted for skilled nursing facility placement at [facility] for ongoing rehabilitation and care needs.    Follow-up  He will require outpatient GI follow-up for repeat imaging and EGD, ongoing management of cirrhosis and portal vein thrombosis, and continued rehabilitation for physical deconditioning.   ______________________________________  Patient's hospitalization prolonged due to hepatic encephalopathy and intermittent confusion, patient remains intermittently confused however is easily redirected.  He is planned to transfer to [facility] for  short-term rehabilitation with possibility of converting to long-term.  Patient is unable to care for himself at home, he requires assistance with ADLs and IADLs.  His lower extremity and testicular edema has significantly improved, he does have some element of chronic edema due to portal venous hypertension and hepatic cirrhosis.  Discussed importance of compliance with medications including lactulose  which seems to have improved patient's mentation significantly when he is having at least 2-3 bowel movements per day with ammonia levels ranging between 20s and 40s.  Discharge physical exam: Alert, pleasantly confused but easily redirected, globally weak 58 year old Lungs clear to auscultation bilaterally Heart irregularly irregular consistent with atrial fibrillation with heart rate controlled for the most part less than 100 Abdomen protuberant due to body habitus, normal bowel sounds, there is abdominal wall edema Scrotal edema, somewhat improved Lower extremity edema somewhat improved Right lower extremity with some erythematous changes which could just be consistent with chronic lower extremity edema however patient had been started on empiric antibiotic therapy and will complete oral doxycycline ______________________________________  Mental Status On day of Discharge:  The patient is Alert and oriented to PERSON The patient is not alert And oriented to TIME The patient is Alert and oriented to LOCATION  CODE STATUS :                    Full Code   An advanced care planning discussion was  had with patient and/or patient's decisions maker (documented separately).  Patient discharged on Home O2? - no Patient discharged on home anticoagulant? -  no  Foley Catheter status: None Central Line Status: NONE  Time Spent on Discharge I spent greater than 30 minutes counseling and coordinating care for the discharge of this patient. The patient and I discussed the importance of outpatient  follow-up as well as concerning signs and symptoms that would require immediate evaluation by a medical professional. The aforementioned conversation participants understand  and did show insight. I did use teachback to ensure understanding. The above participant/s is aware that not following the discussed plan, recommendations, and follow up can lead to severe negative effects on the patient's health, up to and including death.  Discharge Medications     Your Medication List     STOP taking these medications    baclofen  10 MG tablet Commonly known as: LIORESAL    betamethasone  dipropionate 0.05 % cream   calcipotriene 0.005 % ointment Commonly known as: DOVONOX   CONSTULOSE  10 gram/15 mL solution Generic drug: lactulose  Replaced by: lactulose  20 gram/30 mL Soln   diazePAM 5 MG tablet Commonly known as: VALIUM   ferrous sulfate  325 (65 FE) MG EC tablet   furosemide  20 MG tablet Commonly known as: LASIX    ipratropium 0.02 % nebulizer solution Commonly known as: ATROVENT    lidocaine  5 % patch Commonly known as: LIDODERM    omeprazole 20 MG capsule Commonly known as: PriLOSEC   sildenafil  100 MG tablet Commonly known as: VIAGRA    testosterone  1 % (50 mg/5 gram) gel packet       START taking these medications    amoxicillin -clavulanate 875-125 mg per tablet Commonly known as: AUGMENTIN Take 1 tablet by mouth every twelve (12) hours for 4 days.   lactulose  20 gram/30 mL Soln Take 45 mL (30 g total) by mouth every six (6) hours. Replaces: CONSTULOSE  10 gram/15 mL solution   midodrine 5 MG tablet Commonly known as: PROAMATINE Take 1 tablet (5 mg total) by mouth two (2) times a day.       CHANGE how you take these medications    dilTIAZem  240 MG 24 hr capsule Commonly known as: CARDIZEM  CD Take 1 capsule (240 mg total) by mouth daily. What changed: Another medication with the same name was removed. Continue taking this medication, and follow the  directions you see here.   DULoxetine  60 MG capsule Commonly known as: CYMBALTA  Take 1 capsule (60 mg total) by mouth before bedtime. Patient unsure if he's taking this medication. What changed: how much to take   metoPROLOL  tartrate 50 MG tablet Commonly known as: Lopressor  Take 1 tablet (50 mg total) by mouth two (2) times a day. What changed: how much to take   torsemide  20 MG tablet Commonly known as: DEMADEX  Take 1 tablet (20 mg total) by mouth daily. What changed:  medication strength how much to take   VITAMIN B-1 100 mg tablet Generic drug: thiamine  Take 1 tablet (100 mg total) by mouth every morning. What changed: Another medication with the same name was added. Make sure you understand how and when to take each.   thiamine  100 MG tablet Commonly known as: B-1 Take 1 tablet (100 mg total) by mouth daily. What changed: You were already taking a medication with the same name, and this prescription was added. Make sure you understand how and when to take each.       CONTINUE taking these medications    albuterol  90 mcg/actuation inhaler Commonly known as: PROVENTIL  HFA;VENTOLIN  HFA Inhale 2 puffs every six (6) hours as needed for wheezing or shortness of breath.   aspirin  325 MG delayed release (EC) tablet Take 162.5 mg by mouth daily as needed.   buprenorphine  HCL 8 mg tablet Commonly known as: SUBUTEX  Place 1 tablet (8 mg total) under the tongue two (2) times a day for 5 days.   busPIRone  15 MG tablet Commonly known as: BUSPAR  Take 1 tablet (15 mg total)  by mouth Three (3) times a day.   cholecalciferol (vitamin D3-250 mcg (10,000 unit)) 250 mcg (10,000 unit) capsule Take 1 capsule (250 mcg total) by mouth daily.   clobetasol 0.05 % external solution Commonly known as: TEMOVATE Apply topically two (2) times a day. To scalp psoriasis spots until healed   cyanocobalamin  500 MCG tablet Take 1 tablet (500 mcg total) by mouth daily.   ezetimibe  10 mg  tablet Commonly known as: ZETIA  TAKE 1 TABLET BY MOUTH DAILY   fluticasone  propionate 50 mcg/actuation nasal spray Commonly known as: FLONASE  USE 1 SPRAY IN EACH NOSTRIL DAILY   folic acid  800 MCG tablet Commonly known as: FOLVITE  Take 1 tablet (800 mcg total) by mouth daily.   gabapentin  100 MG capsule Commonly known as: NEURONTIN  Take 1 capsule (100 mg total) by mouth Three (3) times a day. Prescribed by [facility]   miscellaneous medical supply Misc 1 Box by Miscellaneous route two (2) times a day as needed.   miscellaneous medical supply Misc Powered wheel chair   miscellaneous medical supply Misc Blood pressure monitor with large cuff   pantoprazole  40 MG tablet Commonly known as: Protonix  TAKE 1 TABLET BY MOUTH EVERY MORNING   pravastatin  10 MG tablet Commonly known as: PRAVACHOL  TAKE 1 TABLET BY MOUTH DAILY   spironolactone  50 MG tablet Commonly known as: ALDACTONE  TAKE 1 TABLET BY MOUTH EVERY DAY   triamcinolone  0.1 % cream Commonly known as: KENALOG  Apply topically two (2) times a day. To psoriasis spots until healed       _____________________________________  Nutrition:                                  Diet Instructions     Discharge diet (specify)     Discharge Nutrition Therapy: Heart Healthy       Patient does not meet AND/ASPEN criteria for malnutrition at this time (09/13/23 1515)             ___________________________________________  Discharge Instructions   Nutrition:                                  Diet Instructions     Discharge diet (specify)     Discharge Nutrition Therapy: Heart Healthy       Activity:                                   Activity Instructions     Activity as tolerated         Appointments:                         Appointments which have been scheduled for you    Mar 12, 2024 2:40 PM (Arrive by 2:10 PM) RETURN CARDIOLOGY with [facility] PROVIDER [facility]  [facility] [place]) [address] [place] [address] [phone]         Follow Up:                              Follow Up instructions and Outpatient Referrals    Discharge instructions         Allergies  Allergen Reactions  . Allopurinol    . Allopurinol  Analogues   . Fluoxetine Itching  . Fluoxetine Hcl Itching     Past Medical History[1]  Past Surgical History[2]   Family History[3]   Current Medications[4]  Imaging  XR Chest Portable Result Date: 09/17/2023 Exam:  Portable Chest  History:  Dyspnea.  Technique:  Single AP portable view  Comparison:  09/11/2023  Findings:   The heart size, mediastinal contours, pleural surfaces and pulmonary vascularity are all normal. The lungs are clear. There is no detectable pleural effusion or pneumothorax. The regional bones are normal for age.      Negative portable single view chest.    Signed (Electronic Signature): 09/17/2023 10:08 PM Signed By: Genia Rosebush, MD  ECG 12 Lead Result Date: 09/13/2023 Accelerated Junctional rhythm Low voltage QRS Septal infarct (cited on or before 07-Oct-2022) Abnormal ECG When compared with ECG of 17-Aug-2023 19:54, Junctional rhythm has replaced Atrial fibrillation Vent. rate has decreased by  39 bpm Nonspecific T wave abnormality, worse in Inferior leads  US  Scrotum Result Date: 09/12/2023 Exam: Ultrasound of the Scrotum  History:  Scrotal swelling.  Technique: Real-time ultrasound of the scrotum.  Comparison:  CT, 09/11/2023  Findings:  TESTES:  The testes are of normal size and echogenicity. No testicular mass is evident on either side. Appropriate testicular blood flow is documented bilaterally. Right testis size:  3.3 x 2.5 x 1.9 cm Left testis size:  3.0 x 2.2 x 1.1 cm  EPIDIDYMIDES:  Normal appearance and vascularity of both epididymides.  ADDITIONAL FINDINGS:  Small right hydrocele. Moderate left hydrocele. Small left varicocele. Severe diffuse scrotal wall edema.      1.    Severe diffuse scrotal wall edema. 2.    Moderate left hydrocele and small varicocele. 3.    Normal sonographic appearance of the testes and epididymides.  Signed (Electronic Signature): 09/12/2023 12:07 PM Signed By: Thayer Kitty, MD  CT Abdomen Pelvis W Contrast Result Date: 09/11/2023 Exam: CT of the Abdomen and Pelvis with Contrast  History: Abdominal and testicular swelling. History of cirrhosis and portal vein thrombosis.  Technique: After administration of intravenous contrast, contiguous axial images were obtained through the abdomen and pelvis during the portal venous phase. Multiplanar and maximum intensity projection coronal reformats were obtained. AEC (automated exposure control) and/or manual techniques such as size-specific kV and mAs are employed where appropriate to reduce radiation exposure for all CT exams.  Comparison: 04/22/2022    Findings: LOWER CHEST: Clear lungs. No pleural effusion or pneumothorax. Normal heart size. LAD coronary artery severe calcification.  LIVER: Atrophic, nodular liver. Segment 5/8 2.5 cm liver simple cysts. No biliary ductal dilatation. No suspicious focal mass lesion.  BILIARY: Cholecystectomy. No biliary ductal dilation.  PANCREAS: Normal background parenchyma. No focal lesion. Main pancreatic duct normal in caliber.  SPLEEN: Stable mild-moderate splenomegaly.  ADRENALS: Normal morphology.  KIDNEYS: No hydroureteronephrosis. No renal lesion.  GASTROINTESTINAL: Stomach normal. No evidence of enterocolitis. No obstruction.  Third portion duodenum 3.5 cm diverticulum. Normal appendix.  PELVIC ORGANS:  Normal bladder. Diminutive prostate gland.  PERITONEUM: Small volume low-attenuation abdominal and pelvic ascites.  LYMPH NODES: Reactive bilateral inguinal lymph nodes.  VASCULATURE: Mild calcification of the distal abdominal aortic and iliac arteries without aneurysmal dilatation. No acute arterial abnormality. Large caliber of the splenic vein and main  portal vein measuring up to 3.5 cm due to portal hypertension with known large nearly occlusive thrombus.  BONES: Lumbar spine spondylosis. Osteopenia. No aggressive lesion. No acute  abnormality.  SOFT TISSUES: Moderate anasarca..    1.    No acute abnormality in the abdomen or pelvis. 2.    Cirrhosis with portal hypertension. Large caliber of the splenic vein and main portal vein due to known large nearly occlusive thrombus. 3.    Small volume ascites. Moderate anasarca.  Signed (Electronic Signature): 09/11/2023 7:32 PM Signed By: Sendhil Cheran, MD  CT Head Wo Contrast Result Date: 09/11/2023 Exam:  CT Head without Contrast  History:  Altered mental status  Technique: Routine brain CT without IV contrast. AEC (automated exposure control) and/or manual techniques such as size-specific kV and mAs are employed where appropriate to reduce radiation exposure for all CT exams.  Comparison:  08/30/2020  Findings:   BRAIN:  No CT evidence of acute infarction, hemorrhage, edema, mass or mass effect. Ventricles and basilar cisterns are unremarkable.  SOFT TISSUES:  Negative. CALVARIUM:  Negative. No fracture. SINUSES AND MASTOIDS:  No mucosal thickening or fluid.     -- No acute intracranial abnormality.  Signed (Electronic Signature): 09/11/2023 3:47 PM Signed By: Alyce Bellman, MD  XR Chest Portable Result Date: 09/11/2023 Exam:  Portable Chest  History:  Altered mental status.  Technique:  Single frontal view.  Comparison:  Radiographs and CT, 08/17/2023.  Findings:   Normal cardiac mediastinal silhouette. The lung volumes are low. No focal consolidation, large pleural effusion, or pneumothorax. No suspicious osseous lesions.    No acute cardiopulmonary abnormality.  Signed (Electronic Signature): 09/11/2023 2:43 PM Signed By: Thayer Kitty, MD   Lab Results   Recent Labs    09/20/23 0559  WBC 9.3  HGB 14.6  HCT 40.9  PLT 63*   Recent Labs    09/19/23 0601 09/19/23 1343 09/20/23 0559  NA 129*   < >  130*  K 2.5*   < > 3.1*  CL 91*   < > 93*  CO2 34.1*   < > 29.6  BUN 22*   < > 18  CREATININE 1.19   < > 1.25  GLU 105   < > 117  CALCIUM 7.8*   < > 7.9*  ALBUMIN 1.7*  --   --   PROT 6.1  --   --   BILITOT 2.7*  --   --   AST 50*  --   --   ALT 23  --   --   ALKPHOS 138*  --   --   MG 1.8  --   --   AMMONIA 42*   < > 41*   < > = values in this interval not displayed.   No results for input(s): CKTOTAL, CKMB, PCTCKMB, TROPONINI, EDTPNI, BNP, INR, LABPROT, APTT, DDIMER in the last 72 hours. Recent Labs    09/17/23 1811  WBCUA 74*  NITRITE Negative  LEUKOCYTESUR Large*  BACTERIA None Seen  RBCUA 0  BLOODU Trace*  GLUCOSEU Negative  PROTEINUA Negative  KETONESU Negative   No results for input(s): OPIAU, BENZU, TRICYCLIC, PCPU, AMPHU, COCAU, CANNAU, BARBU, ETOH, ACETAMIN, SALICYLATE in the last 72 hours. No results for input(s): PREGTESTUR, PREGPOC in the last 72 hours. No results for input(s): OCCULTBLD, RAPSCRN, CDIFRPCR, CDIFFNAP1, A1C, CHOL, LDL, HDL, TRIG in the last 72 hours. No results for input(s): O2SOUR, FIO2ART, PHART, PCO2ART, PO2ART, HCO3ART, O2SATART, BEART in the last 72 hours.   Home Medications   Prior to Admission medications  Medication Dose, Route, Frequency  albuterol  HFA 90 mcg/actuation inhaler 2 puffs, Every 6 hours PRN  aspirin   325 MG delayed release (EC) tablet 162.5 mg, Daily PRN  betamethasone  dipropionate 0.05 % cream Topical, 2 times a day (standard)  busPIRone  (BUSPAR ) 15 MG tablet 15 mg, Oral, 3 times a day (standard)  calcipotriene (DOVONOX) 0.005 % ointment Topical, 2 times a day (standard)  cholecalciferol, vitamin D3-250 mcg, 10,000 unit,, 250 mcg (10,000 unit) capsule 250 mcg, Oral, Daily (standard)  clobetasol (TEMOVATE) 0.05 % external solution Topical, 2 times a day (standard), To scalp psoriasis spots until healed  CONSTULOSE  10 gram/15 mL solution 10  g, Oral, 2 times a day (standard)  fluticasone  propionate (FLONASE ) 50 mcg/actuation nasal spray 1 spray, Each Nare  folic acid  (FOLVITE ) 800 MCG tablet 800 mcg, Oral, Daily (standard)  furosemide  (LASIX ) 20 MG tablet 20 mg, Oral, Daily (standard)  gabapentin  (NEURONTIN ) 100 MG capsule 100 mg, 3 times a day (standard)  omeprazole (PRILOSEC) 20 MG capsule 20 mg, Daily (standard)  pantoprazole  (PROTONIX ) 40 MG tablet 40 mg, Oral, Every morning  pravastatin  (PRAVACHOL ) 10 MG tablet TAKE 1 TABLET BY MOUTH DAILY  spironolactone  (ALDACTONE ) 50 MG tablet 50 mg, Oral, Daily (standard)  testosterone  1 % (50 mg/5 gram) gel packet 50 mg, Transdermal, Daily (standard)  triamcinolone  (KENALOG ) 0.1 % cream Topical, 2 times a day (standard), To psoriasis spots until healed  VITAMIN B-1 100 mg tablet 100 mg, Oral, Every morning  amoxicillin -clavulanate (AUGMENTIN) 875-125 mg per tablet 1 tablet, Oral, Every 12 hours  baclofen  (LIORESAL ) 10 MG tablet 10 mg, At bedtime Patient not taking: Reported on 09/05/2023  buprenorphine  HCL (SUBUTEX ) 8 mg tablet 8 mg, Sublingual, 2 times a day (standard)  cyanocobalamin  500 MCG tablet 1 tablet, Daily (standard)  diazePAM (VALIUM) 5 MG tablet Take 30 to 45 minutes prior to testing, do not drive on medication Patient not taking: Reported on 09/05/2023  dilTIAZem  (CARDIZEM  CD) 240 MG 24 hr capsule 240 mg, Oral, Daily (standard) Patient taking differently: Take 180 mg by mouth daily.  dilTIAZem  (CARDIZEM  CD) 240 MG 24 hr capsule 240 mg, Oral, Daily (standard)  DULoxetine  (CYMBALTA ) 60 MG capsule 60 mg, Oral, Daily (standard), Patient unsure if he's taking this medication.  ezetimibe  (ZETIA ) 10 mg tablet 10 mg, Oral, Daily (standard)  ferrous sulfate  325 (65 FE) MG EC tablet 1 tablet, Daily before breakfast Patient not taking: Reported on 09/05/2023  ipratropium (ATROVENT ) 0.02 % nebulizer solution 500 mcg, Nebulization, 4 times a day Patient not taking: Reported on 09/05/2023   lactulose  20 gram/30 mL Soln 30 g, Oral, Every 6 hours  lidocaine  (LIDODERM ) 5 % patch apply 2 patches onto THE SKIN DAILY (12 HOURS ON AND 12 HOURS off) Patient not taking: Reported on 09/05/2023  metoPROLOL  tartrate (LOPRESSOR ) 50 MG tablet 25 mg, Oral, 2 times a day (standard) Patient taking differently: Take 2 tablets (100 mg total) by mouth two (2) times a day.  metoPROLOL  tartrate (LOPRESSOR ) 50 MG tablet 50 mg, Oral, 2 times a day (standard)  midodrine (PROAMATINE) 5 MG tablet 5 mg, Oral, 2 times a day  miscellaneous medical supply Misc 1 Box., Miscellaneous, 2 times a day PRN  miscellaneous medical supply Misc Powered wheel chair  miscellaneous medical supply Misc Blood pressure monitor with large cuff  sildenafil  (VIAGRA ) 100 MG tablet 100 mg, Oral, Daily PRN  thiamine  (B-1) 100 MG tablet 100 mg, Oral, Daily (standard)  torsemide  (DEMADEX ) 20 MG tablet 20 mg, Oral, Daily (standard)   Margart Dragon, DO Hospitalist, [facility] 09/20/23, 1:38 PM      [1] Past Medical History:  Diagnosis Date  . Asthma (HHS-HCC)   . CHF (congestive heart failure)    (CMS-HCC)   . Cirrhosis of liver    (CMS-HCC)   . COPD (chronic obstructive pulmonary disease)    (CMS-HCC)   . Diverticulitis   . Osteoarthrosis   . Psoriasis   [2] Past Surgical History: Procedure Laterality Date  . CHOLECYSTECTOMY    . COLONOSCOPY    . HERNIA REPAIR     umbilical  . JOINT REPLACEMENT    . TONSILLECTOMY    [3] Family History Problem Relation Age of Onset  . No Known Problems Mother   . No Known Problems Father   . No Known Problems Sister   . Heart disease Sister   . No Known Problems Daughter   [4]  Current Facility-Administered Medications:  .  acetaminophen  (TYLENOL ) tablet 650 mg, 650 mg, Oral, Q4H PRN, Vendura, Khoury William, DO, 650 mg at 09/13/23 1117 .  amoxicillin -clavulanate (AUGMENTIN) 875-125 mg per tablet 1 tablet, 1 tablet, Oral, Q12H SCH, Feliz Margart Fallow, DO, 1 tablet at  09/20/23 0836 .  buprenorphine  HCL (SUBUTEX ) SL tablet 4 mg, 4 mg, Sublingual, BID, Baveja, Punit, MD, 4 mg at 09/20/23 9167 .  busPIRone  (BUSPAR ) tablet 15 mg, 15 mg, Oral, TID, Vendura, Khoury William, DO, 15 mg at 09/20/23 9164 .  dilTIAZem  (CARDIZEM  CD) 24 hr capsule 240 mg, 240 mg, Oral, Daily, Baveja, Punit, MD, 240 mg at 09/20/23 0820 .  DULoxetine  (CYMBALTA ) DR capsule 60 mg, 60 mg, Oral, Daily, Vendura, Khoury William, DO, 60 mg at 09/20/23 0835 .  ezetimibe  (ZETIA ) tablet 10 mg, 10 mg, Oral, Daily, Feliz Margart Fallow, DO, 10 mg at 09/20/23 0835 .  famotidine (PEPCID) tablet 20 mg, 20 mg, Oral, BID, Baveja, Punit, MD, 20 mg at 09/20/23 0838 .  folic acid  (FOLVITE ) tablet 1 mg, 1 mg, Oral, Daily, Feliz Margart Fallow, DO, 1 mg at 09/20/23 0835 .  lactulose  oral solution, 30 g, Oral, Q6H, Baveja, Punit, MD, 30 g at 09/20/23 0837 .  magnesium  oxide (MAG-OX) tablet 800 mg, 800 mg, Oral, BID, Baveja, Punit, MD, 800 mg at 09/20/23 0836 .  metoPROLOL  tartrate (Lopressor ) tablet 50 mg, 50 mg, Oral, BID, Baveja, Punit, MD, 50 mg at 09/20/23 0820 .  midodrine (PROAMATINE) tablet 5 mg, 5 mg, Oral, BID, Baveja, Punit, MD, 5 mg at 09/20/23 0820 .  multivitamins, therapeutic with minerals tablet 1 tablet, 1 tablet, Oral, Daily, Baveja, Punit, MD, 1 tablet at 09/20/23 0832 .  ondansetron  (ZOFRAN ) injection 4 mg, 4 mg, Intravenous, Q8H PRN **OR** ondansetron  (ZOFRAN ) injection 8 mg, 8 mg, Intravenous, Q8H PRN, Vendura, Margart Fallow, DO .  [Provider Hold] potassium chloride  ER tablet 20 mEq, 20 mEq, Oral, Daily, Baveja, Punit, MD, 20 mEq at 09/18/23 9167 .  pravastatin  (PRAVACHOL ) tablet 10 mg, 10 mg, Oral, Daily, Feliz Margart Fallow, DO, 10 mg at 09/20/23 9165 .  spironolactone  (ALDACTONE ) tablet 50 mg, 50 mg, Oral, Daily, Feliz Margart Fallow, DO, 50 mg at 09/20/23 0836 .  thiamine  mononitrate (vit B1) tablet 100 mg, 100 mg, Oral, Daily, Feliz Margart Fallow, DO, 100 mg at 09/20/23  0835 .  white petrolatum-mineral oil-lanolin topical cream, , Topical, BID, Baveja, Punit, MD, Given at 09/20/23 [phone]

## 2023-09-21 DIAGNOSIS — I81 Portal vein thrombosis: Secondary | ICD-10-CM | POA: Diagnosis not present

## 2023-09-21 DIAGNOSIS — M069 Rheumatoid arthritis, unspecified: Secondary | ICD-10-CM | POA: Diagnosis present

## 2023-09-21 DIAGNOSIS — K766 Portal hypertension: Secondary | ICD-10-CM | POA: Diagnosis present

## 2023-09-21 DIAGNOSIS — R600 Localized edema: Secondary | ICD-10-CM | POA: Diagnosis not present

## 2023-09-21 DIAGNOSIS — I480 Paroxysmal atrial fibrillation: Secondary | ICD-10-CM | POA: Diagnosis present

## 2023-09-21 DIAGNOSIS — Z789 Other specified health status: Secondary | ICD-10-CM | POA: Diagnosis not present

## 2023-09-21 DIAGNOSIS — R609 Edema, unspecified: Secondary | ICD-10-CM | POA: Diagnosis not present

## 2023-09-21 DIAGNOSIS — E768 Other disorders of glucosaminoglycan metabolism: Secondary | ICD-10-CM | POA: Diagnosis not present

## 2023-09-21 DIAGNOSIS — K7460 Unspecified cirrhosis of liver: Secondary | ICD-10-CM | POA: Diagnosis not present

## 2023-09-21 DIAGNOSIS — K55069 Acute infarction of intestine, part and extent unspecified: Secondary | ICD-10-CM | POA: Diagnosis not present

## 2023-09-21 DIAGNOSIS — R7303 Prediabetes: Secondary | ICD-10-CM | POA: Diagnosis not present

## 2023-09-21 DIAGNOSIS — R278 Other lack of coordination: Secondary | ICD-10-CM | POA: Diagnosis not present

## 2023-09-21 DIAGNOSIS — I10 Essential (primary) hypertension: Secondary | ICD-10-CM | POA: Diagnosis not present

## 2023-09-21 DIAGNOSIS — Z515 Encounter for palliative care: Secondary | ICD-10-CM | POA: Diagnosis not present

## 2023-09-21 DIAGNOSIS — I5030 Unspecified diastolic (congestive) heart failure: Secondary | ICD-10-CM | POA: Diagnosis not present

## 2023-09-21 DIAGNOSIS — I4891 Unspecified atrial fibrillation: Secondary | ICD-10-CM | POA: Diagnosis not present

## 2023-09-21 DIAGNOSIS — Z7189 Other specified counseling: Secondary | ICD-10-CM | POA: Diagnosis not present

## 2023-09-21 DIAGNOSIS — J449 Chronic obstructive pulmonary disease, unspecified: Secondary | ICD-10-CM | POA: Diagnosis not present

## 2023-09-21 DIAGNOSIS — K7031 Alcoholic cirrhosis of liver with ascites: Secondary | ICD-10-CM | POA: Diagnosis present

## 2023-09-21 DIAGNOSIS — I959 Hypotension, unspecified: Secondary | ICD-10-CM | POA: Diagnosis not present

## 2023-09-21 DIAGNOSIS — E2740 Unspecified adrenocortical insufficiency: Secondary | ICD-10-CM | POA: Diagnosis present

## 2023-09-21 DIAGNOSIS — I5032 Chronic diastolic (congestive) heart failure: Secondary | ICD-10-CM | POA: Diagnosis present

## 2023-09-21 DIAGNOSIS — K7682 Hepatic encephalopathy: Secondary | ICD-10-CM | POA: Diagnosis present

## 2023-09-21 DIAGNOSIS — D696 Thrombocytopenia, unspecified: Secondary | ICD-10-CM | POA: Diagnosis not present

## 2023-09-21 DIAGNOSIS — E860 Dehydration: Secondary | ICD-10-CM | POA: Diagnosis present

## 2023-09-21 DIAGNOSIS — I38 Endocarditis, valve unspecified: Secondary | ICD-10-CM | POA: Diagnosis present

## 2023-09-21 DIAGNOSIS — E876 Hypokalemia: Secondary | ICD-10-CM | POA: Diagnosis not present

## 2023-09-21 DIAGNOSIS — R7881 Bacteremia: Secondary | ICD-10-CM | POA: Diagnosis present

## 2023-09-21 DIAGNOSIS — R0689 Other abnormalities of breathing: Secondary | ICD-10-CM | POA: Diagnosis not present

## 2023-09-21 DIAGNOSIS — I272 Pulmonary hypertension, unspecified: Secondary | ICD-10-CM | POA: Diagnosis not present

## 2023-09-21 DIAGNOSIS — E8809 Other disorders of plasma-protein metabolism, not elsewhere classified: Secondary | ICD-10-CM | POA: Diagnosis present

## 2023-09-21 DIAGNOSIS — K55059 Acute (reversible) ischemia of intestine, part and extent unspecified: Secondary | ICD-10-CM | POA: Diagnosis present

## 2023-09-21 DIAGNOSIS — K7030 Alcoholic cirrhosis of liver without ascites: Secondary | ICD-10-CM | POA: Diagnosis not present

## 2023-09-21 DIAGNOSIS — R0602 Shortness of breath: Secondary | ICD-10-CM | POA: Diagnosis not present

## 2023-09-21 DIAGNOSIS — Z1152 Encounter for screening for COVID-19: Secondary | ICD-10-CM | POA: Diagnosis not present

## 2023-09-21 DIAGNOSIS — D735 Infarction of spleen: Secondary | ICD-10-CM | POA: Diagnosis not present

## 2023-09-21 DIAGNOSIS — B9561 Methicillin susceptible Staphylococcus aureus infection as the cause of diseases classified elsewhere: Secondary | ICD-10-CM | POA: Diagnosis not present

## 2023-09-21 DIAGNOSIS — F1010 Alcohol abuse, uncomplicated: Secondary | ICD-10-CM | POA: Diagnosis present

## 2023-09-21 DIAGNOSIS — D6959 Other secondary thrombocytopenia: Secondary | ICD-10-CM | POA: Diagnosis present

## 2023-09-21 DIAGNOSIS — E039 Hypothyroidism, unspecified: Secondary | ICD-10-CM | POA: Diagnosis present

## 2023-09-21 DIAGNOSIS — M6281 Muscle weakness (generalized): Secondary | ICD-10-CM | POA: Diagnosis not present

## 2023-09-21 DIAGNOSIS — J4489 Other specified chronic obstructive pulmonary disease: Secondary | ICD-10-CM | POA: Diagnosis present

## 2023-09-21 DIAGNOSIS — R739 Hyperglycemia, unspecified: Secondary | ICD-10-CM | POA: Diagnosis not present

## 2023-09-21 DIAGNOSIS — I8500 Esophageal varices without bleeding: Secondary | ICD-10-CM | POA: Diagnosis not present

## 2023-09-21 DIAGNOSIS — E871 Hypo-osmolality and hyponatremia: Secondary | ICD-10-CM | POA: Diagnosis present

## 2023-09-21 DIAGNOSIS — K7290 Hepatic failure, unspecified without coma: Secondary | ICD-10-CM | POA: Diagnosis not present

## 2023-09-21 DIAGNOSIS — Z6841 Body Mass Index (BMI) 40.0 and over, adult: Secondary | ICD-10-CM | POA: Diagnosis not present

## 2023-09-21 DIAGNOSIS — D509 Iron deficiency anemia, unspecified: Secondary | ICD-10-CM | POA: Diagnosis present

## 2023-09-21 DIAGNOSIS — I8510 Secondary esophageal varices without bleeding: Secondary | ICD-10-CM | POA: Diagnosis present

## 2023-09-21 DIAGNOSIS — I110 Hypertensive heart disease with heart failure: Secondary | ICD-10-CM | POA: Diagnosis present

## 2023-09-21 DIAGNOSIS — M810 Age-related osteoporosis without current pathological fracture: Secondary | ICD-10-CM | POA: Diagnosis not present

## 2023-09-21 DIAGNOSIS — I82890 Acute embolism and thrombosis of other specified veins: Secondary | ICD-10-CM | POA: Diagnosis not present

## 2023-09-21 DIAGNOSIS — G9341 Metabolic encephalopathy: Secondary | ICD-10-CM | POA: Diagnosis present

## 2023-09-21 DIAGNOSIS — Z558 Other problems related to education and literacy: Secondary | ICD-10-CM | POA: Diagnosis not present

## 2023-09-25 ENCOUNTER — Other Ambulatory Visit: Payer: Self-pay

## 2023-09-25 ENCOUNTER — Inpatient Hospital Stay (HOSPITAL_COMMUNITY)
Admission: EM | Admit: 2023-09-25 | Discharge: 2023-10-05 | DRG: 441 | Disposition: A | Discharge status: Skilled Nursing Facility | Source: Skilled Nursing Facility | Attending: Internal Medicine | Admitting: Internal Medicine

## 2023-09-25 ENCOUNTER — Encounter (HOSPITAL_COMMUNITY): Payer: Self-pay

## 2023-09-25 ENCOUNTER — Emergency Department (HOSPITAL_COMMUNITY)

## 2023-09-25 DIAGNOSIS — E2740 Unspecified adrenocortical insufficiency: Secondary | ICD-10-CM | POA: Diagnosis not present

## 2023-09-25 DIAGNOSIS — B9561 Methicillin susceptible Staphylococcus aureus infection as the cause of diseases classified elsewhere: Secondary | ICD-10-CM | POA: Diagnosis present

## 2023-09-25 DIAGNOSIS — R7881 Bacteremia: Secondary | ICD-10-CM | POA: Diagnosis present

## 2023-09-25 DIAGNOSIS — E8809 Other disorders of plasma-protein metabolism, not elsewhere classified: Secondary | ICD-10-CM | POA: Diagnosis present

## 2023-09-25 DIAGNOSIS — I8510 Secondary esophageal varices without bleeding: Secondary | ICD-10-CM | POA: Diagnosis not present

## 2023-09-25 DIAGNOSIS — Z888 Allergy status to other drugs, medicaments and biological substances status: Secondary | ICD-10-CM

## 2023-09-25 DIAGNOSIS — K7030 Alcoholic cirrhosis of liver without ascites: Secondary | ICD-10-CM

## 2023-09-25 DIAGNOSIS — I10 Essential (primary) hypertension: Secondary | ICD-10-CM | POA: Diagnosis present

## 2023-09-25 DIAGNOSIS — D696 Thrombocytopenia, unspecified: Secondary | ICD-10-CM | POA: Diagnosis not present

## 2023-09-25 DIAGNOSIS — R0689 Other abnormalities of breathing: Secondary | ICD-10-CM | POA: Diagnosis not present

## 2023-09-25 DIAGNOSIS — Z1152 Encounter for screening for COVID-19: Secondary | ICD-10-CM

## 2023-09-25 DIAGNOSIS — I38 Endocarditis, valve unspecified: Secondary | ICD-10-CM | POA: Diagnosis present

## 2023-09-25 DIAGNOSIS — Z6841 Body Mass Index (BMI) 40.0 and over, adult: Secondary | ICD-10-CM

## 2023-09-25 DIAGNOSIS — G4733 Obstructive sleep apnea (adult) (pediatric): Secondary | ICD-10-CM | POA: Diagnosis present

## 2023-09-25 DIAGNOSIS — L304 Erythema intertrigo: Secondary | ICD-10-CM | POA: Diagnosis present

## 2023-09-25 DIAGNOSIS — I480 Paroxysmal atrial fibrillation: Secondary | ICD-10-CM

## 2023-09-25 DIAGNOSIS — I5032 Chronic diastolic (congestive) heart failure: Secondary | ICD-10-CM | POA: Diagnosis not present

## 2023-09-25 DIAGNOSIS — Z7982 Long term (current) use of aspirin: Secondary | ICD-10-CM

## 2023-09-25 DIAGNOSIS — M069 Rheumatoid arthritis, unspecified: Secondary | ICD-10-CM | POA: Diagnosis present

## 2023-09-25 DIAGNOSIS — Z79899 Other long term (current) drug therapy: Secondary | ICD-10-CM

## 2023-09-25 DIAGNOSIS — R739 Hyperglycemia, unspecified: Secondary | ICD-10-CM | POA: Diagnosis not present

## 2023-09-25 DIAGNOSIS — J4489 Other specified chronic obstructive pulmonary disease: Secondary | ICD-10-CM | POA: Diagnosis present

## 2023-09-25 DIAGNOSIS — G9341 Metabolic encephalopathy: Secondary | ICD-10-CM | POA: Diagnosis not present

## 2023-09-25 DIAGNOSIS — G8929 Other chronic pain: Secondary | ICD-10-CM | POA: Diagnosis present

## 2023-09-25 DIAGNOSIS — E860 Dehydration: Secondary | ICD-10-CM | POA: Diagnosis present

## 2023-09-25 DIAGNOSIS — E039 Hypothyroidism, unspecified: Secondary | ICD-10-CM | POA: Diagnosis not present

## 2023-09-25 DIAGNOSIS — Z79890 Hormone replacement therapy (postmenopausal): Secondary | ICD-10-CM

## 2023-09-25 DIAGNOSIS — R7303 Prediabetes: Secondary | ICD-10-CM | POA: Diagnosis not present

## 2023-09-25 DIAGNOSIS — Z87891 Personal history of nicotine dependence: Secondary | ICD-10-CM

## 2023-09-25 DIAGNOSIS — E871 Hypo-osmolality and hyponatremia: Secondary | ICD-10-CM | POA: Diagnosis present

## 2023-09-25 DIAGNOSIS — K7460 Unspecified cirrhosis of liver: Secondary | ICD-10-CM | POA: Diagnosis present

## 2023-09-25 DIAGNOSIS — K429 Umbilical hernia without obstruction or gangrene: Secondary | ICD-10-CM | POA: Diagnosis present

## 2023-09-25 DIAGNOSIS — K219 Gastro-esophageal reflux disease without esophagitis: Secondary | ICD-10-CM | POA: Diagnosis present

## 2023-09-25 DIAGNOSIS — K7290 Hepatic failure, unspecified without coma: Secondary | ICD-10-CM | POA: Diagnosis not present

## 2023-09-25 DIAGNOSIS — K7682 Hepatic encephalopathy: Principal | ICD-10-CM | POA: Diagnosis present

## 2023-09-25 DIAGNOSIS — K7031 Alcoholic cirrhosis of liver with ascites: Secondary | ICD-10-CM | POA: Diagnosis present

## 2023-09-25 DIAGNOSIS — D509 Iron deficiency anemia, unspecified: Secondary | ICD-10-CM | POA: Diagnosis not present

## 2023-09-25 DIAGNOSIS — E876 Hypokalemia: Secondary | ICD-10-CM | POA: Diagnosis not present

## 2023-09-25 DIAGNOSIS — I482 Chronic atrial fibrillation, unspecified: Secondary | ICD-10-CM | POA: Diagnosis present

## 2023-09-25 DIAGNOSIS — J449 Chronic obstructive pulmonary disease, unspecified: Secondary | ICD-10-CM | POA: Diagnosis not present

## 2023-09-25 DIAGNOSIS — I110 Hypertensive heart disease with heart failure: Secondary | ICD-10-CM | POA: Diagnosis present

## 2023-09-25 DIAGNOSIS — L409 Psoriasis, unspecified: Secondary | ICD-10-CM | POA: Diagnosis present

## 2023-09-25 DIAGNOSIS — D6959 Other secondary thrombocytopenia: Secondary | ICD-10-CM | POA: Diagnosis not present

## 2023-09-25 DIAGNOSIS — I959 Hypotension, unspecified: Secondary | ICD-10-CM | POA: Diagnosis present

## 2023-09-25 DIAGNOSIS — R0602 Shortness of breath: Secondary | ICD-10-CM | POA: Diagnosis not present

## 2023-09-25 DIAGNOSIS — I5030 Unspecified diastolic (congestive) heart failure: Secondary | ICD-10-CM | POA: Diagnosis not present

## 2023-09-25 DIAGNOSIS — M549 Dorsalgia, unspecified: Secondary | ICD-10-CM | POA: Diagnosis present

## 2023-09-25 DIAGNOSIS — I8500 Esophageal varices without bleeding: Secondary | ICD-10-CM | POA: Diagnosis present

## 2023-09-25 DIAGNOSIS — G629 Polyneuropathy, unspecified: Secondary | ICD-10-CM | POA: Diagnosis present

## 2023-09-25 DIAGNOSIS — Z800 Family history of malignant neoplasm of digestive organs: Secondary | ICD-10-CM

## 2023-09-25 DIAGNOSIS — F1010 Alcohol abuse, uncomplicated: Secondary | ICD-10-CM | POA: Diagnosis present

## 2023-09-25 DIAGNOSIS — K766 Portal hypertension: Secondary | ICD-10-CM | POA: Diagnosis not present

## 2023-09-25 DIAGNOSIS — K55059 Acute (reversible) ischemia of intestine, part and extent unspecified: Secondary | ICD-10-CM | POA: Diagnosis present

## 2023-09-25 DIAGNOSIS — Z808 Family history of malignant neoplasm of other organs or systems: Secondary | ICD-10-CM

## 2023-09-25 DIAGNOSIS — I509 Heart failure, unspecified: Secondary | ICD-10-CM

## 2023-09-25 DIAGNOSIS — I4891 Unspecified atrial fibrillation: Secondary | ICD-10-CM | POA: Diagnosis present

## 2023-09-25 LAB — COMPREHENSIVE METABOLIC PANEL WITH GFR
ALT: 25 U/L (ref 0–44)
AST: 59 U/L — ABNORMAL HIGH (ref 15–41)
Albumin: 2.2 g/dL — ABNORMAL LOW (ref 3.5–5.0)
Alkaline Phosphatase: 95 U/L (ref 38–126)
Anion gap: 13 (ref 5–15)
BUN: 13 mg/dL (ref 6–20)
CO2: 30 mmol/L (ref 22–32)
Calcium: 8.1 mg/dL — ABNORMAL LOW (ref 8.9–10.3)
Chloride: 92 mmol/L — ABNORMAL LOW (ref 98–111)
Creatinine, Ser: 0.81 mg/dL (ref 0.61–1.24)
GFR, Estimated: >60 mL/min (ref >60)
Glucose, Bld: 116 mg/dL — ABNORMAL HIGH (ref 70–99)
Potassium: 2.8 mmol/L — ABNORMAL LOW (ref 3.5–5.1)
Sodium: 135 mmol/L (ref 135–145)
Total Bilirubin: 3.7 mg/dL — ABNORMAL HIGH (ref 0.0–1.2)
Total Protein: 6.7 g/dL (ref 6.5–8.1)

## 2023-09-25 LAB — CBC
HCT: 34.5 % — ABNORMAL LOW (ref 39.0–52.0)
Hemoglobin: 11.5 g/dL — ABNORMAL LOW (ref 13.0–17.0)
MCH: 30.3 pg (ref 26.0–34.0)
MCHC: 33.3 g/dL (ref 30.0–36.0)
MCV: 90.8 fL (ref 80.0–100.0)
Platelets: 83 K/uL — ABNORMAL LOW (ref 150–400)
RBC: 3.8 MIL/uL — ABNORMAL LOW (ref 4.22–5.81)
RDW: 14.6 % (ref 11.5–15.5)
WBC: 9 K/uL (ref 4.0–10.5)
nRBC: 0 % (ref 0.0–0.2)

## 2023-09-25 LAB — AMMONIA: Ammonia: 38 umol/L — ABNORMAL HIGH (ref 9–35)

## 2023-09-25 LAB — CBG MONITORING, ED: Glucose-Capillary: 121 mg/dL — ABNORMAL HIGH (ref 70–99)

## 2023-09-25 MED ORDER — POTASSIUM CHLORIDE IN NACL 20-0.9 MEQ/L-% IV SOLN: INTRAVENOUS | Status: DC

## 2023-09-25 MED ORDER — LORAZEPAM 2 MG/ML IJ SOLN
0.5000 mg | Freq: Four times a day (QID) | INTRAMUSCULAR | Status: DC | PRN
  Administered 2023-09-26 – 2023-10-05 (×9): 0.5 mg via INTRAVENOUS
  Filled 2023-09-25 (×11): qty 1

## 2023-09-25 MED ORDER — POTASSIUM CHLORIDE IN NACL 20-0.9 MEQ/L-% IV SOLN
INTRAVENOUS | Status: DC
  Filled 2023-09-25: qty 1000

## 2023-09-25 MED ORDER — ZIPRASIDONE MESYLATE 20 MG IM SOLR
20.0000 mg | Freq: Once | INTRAMUSCULAR | Status: AC
Start: 2023-09-25 — End: 2023-09-25
  Administered 2023-09-25: 20 mg via INTRAMUSCULAR
  Filled 2023-09-25 (×2): qty 20

## 2023-09-25 MED ORDER — LACTULOSE 10 GM/15ML PO SOLN
20.0000 g | Freq: Two times a day (BID) | ORAL | Status: DC
  Filled 2023-09-25: qty 30

## 2023-09-25 MED ORDER — LORAZEPAM 2 MG/ML IJ SOLN
1.0000 mg | Freq: Once | INTRAMUSCULAR | Status: AC
Start: 2023-09-25 — End: 2023-09-25
  Administered 2023-09-25: 1 mg via INTRAVENOUS

## 2023-09-25 MED ORDER — POLYETHYLENE GLYCOL 3350 17 G PO PACK: 17.0000 g | PACK | Freq: Every day | ORAL | Status: DC | PRN

## 2023-09-25 MED ORDER — ACETAMINOPHEN 325 MG PO TABS
650.0000 mg | ORAL_TABLET | Freq: Four times a day (QID) | ORAL | Status: DC | PRN
  Administered 2023-09-28 – 2023-09-29 (×2): 650 mg via ORAL
  Filled 2023-09-25 (×2): qty 2

## 2023-09-25 MED ORDER — ONDANSETRON HCL 4 MG PO TABS: 4.0000 mg | ORAL_TABLET | Freq: Four times a day (QID) | ORAL | Status: DC | PRN

## 2023-09-25 MED ORDER — ACETAMINOPHEN 650 MG RE SUPP: 650.0000 mg | Freq: Four times a day (QID) | RECTAL | Status: DC | PRN

## 2023-09-25 MED ORDER — SODIUM CHLORIDE 0.9 % IV BOLUS
500.0000 mL | Freq: Once | INTRAVENOUS | Status: AC
  Administered 2023-09-25: 500 mL via INTRAVENOUS

## 2023-09-25 MED ORDER — POTASSIUM CHLORIDE 10 MEQ/100ML IV SOLN
10.0000 meq | INTRAVENOUS | Status: AC
  Administered 2023-09-25 (×3): 10 meq via INTRAVENOUS
  Filled 2023-09-25 (×4): qty 100

## 2023-09-25 MED ORDER — ONDANSETRON HCL 4 MG/2ML IJ SOLN: 4.0000 mg | Freq: Four times a day (QID) | INTRAMUSCULAR | Status: DC | PRN

## 2023-09-25 MED ORDER — POTASSIUM CHLORIDE CRYS ER 20 MEQ PO TBCR
40.0000 meq | EXTENDED_RELEASE_TABLET | Freq: Once | ORAL | Status: AC
Start: 2023-09-25 — End: 2023-09-25
  Administered 2023-09-25: 40 meq via ORAL
  Filled 2023-09-25: qty 2

## 2023-09-25 MED ORDER — LACTULOSE 10 GM/15ML PO SOLN
10.0000 g | Freq: Once | ORAL | Status: AC
  Administered 2023-09-25: 10 g via ORAL
  Filled 2023-09-25: qty 30

## 2023-09-25 NOTE — ED Triage Notes (Signed)
 Pt sent by [facility] for abnormal potassium and ammonia level. Per staff potassium 2.7, ammonia 63. Per staff pt has hx of metabolic encephalopathy. Pt able to state in the hospital but thinks he is in [place].

## 2023-09-25 NOTE — Assessment & Plan Note (Addendum)
 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  Platelets- 83, (baseline 30s to 50s). -Resume lactulose ,  -Hold Lasix , spironolactone 

## 2023-09-25 NOTE — Assessment & Plan Note (Signed)
 Potassium 2.8.  Magnesium  1.8.  QTc prolonged at 520. - replete.

## 2023-09-25 NOTE — ED Notes (Signed)
 Call placed to [facility], advised pt has had periods of hypotension and is confused, advised strong nurse on 2A and safety sitter will be assigned to pt

## 2023-09-25 NOTE — ED Notes (Signed)
 See triage notes. Pt reaching for things not there. Able to state name/dob. Stated it is August and he is in [place]. Chronic ble swelling noted. Slightly jaundiced

## 2023-09-25 NOTE — ED Provider Notes (Signed)
 [place] EMERGENCY DEPARTMENT AT [facility] Provider Note   CSN: [id number] Arrival date & time: 09/25/23  1404     Patient presents with: abnormal labs   [name] is a 58 y.o. male.  {Add pertinent medical, surgical, social history, OB history to HPI:[id number]} 57 year old male presents for evaluation of abnormal labs and altered mental status.  He is awake and alert but altered and does not make a lot of sense.  History of alcoholic cirrhosis and encephalopathy.  He was sent in by the nursing home for elevated ammonia and hypokalemia.  Their ammonia was 63.  Patient does appear encephalopathic and is grabbing at things in the air that are not there.        Prior to Admission medications   Medication Sig Start Date End Date Taking? Authorizing Provider  albuterol  (VENTOLIN  HFA) 108 (90 Base) MCG/ACT inhaler INHALE 2 PUFFS BY MOUTH EVERY 6 HOURS AS NEEDED FOR SHORTNESS OF BREATH OR WHEEZING 12/04/22   Jude Harden GAILS, MD  aspirin  EC 325 MG tablet Take 162.5 mg by mouth every 6 (six) hours as needed (chest pain).    [provider]  baclofen  (LIORESAL ) 10 MG tablet TAKE 1 TABLET BY MOUTH THREE TIMES A DAY Patient taking differently: Take 10 mg by mouth 3 (three) times daily as needed for muscle spasms. 01/07/20   Dettinger, Fonda LABOR, MD  betamethasone  dipropionate 0.05 % cream APPLY TO AFFECTED AREA TWICE A DAY Patient taking differently: Apply 1 application  topically daily as needed (itching). 01/21/19   Dettinger, Fonda LABOR, MD  buprenorphine  (SUBUTEX ) 8 MG SUBL SL tablet Place 8 mg under the tongue every 8 (eight) hours. 07/17/22   [provider]  busPIRone  (BUSPAR ) 10 MG tablet TAKE 1 TABLET BY MOUTH THREE TIMES A DAY Patient taking differently: Take 10 mg by mouth 3 (three) times daily as needed (panic attacks). 01/15/20   Dettinger, Fonda LABOR, MD  busPIRone  (BUSPAR ) 10 MG tablet Take 1 tablet by mouth 3 (three) times daily. 08/27/22   [provider]  calcipotriene (DOVONOX) 0.005 % ointment APPLY TO THE AFFECTED AREA(S) TOPICALLY TWICE DAILY 01/29/23   [provider]  carvedilol  (COREG ) 12.5 MG tablet Take 1 tablet (12.5 mg total) by mouth 2 (two) times daily with a meal. 08/17/22   Eartha Flavors, Toribio, MD  clobetasol (TEMOVATE) 0.05 % external solution Apply topically. 05/28/23 05/27/24  [provider]  cyanocobalamin  (V-R VITAMIN B-12) 500 MCG tablet Take 1 tablet (500 mcg total) by mouth every Monday, Wednesday, and Friday. 06/12/23   Lamon Pleasant HERO, PA-C  diclofenac Sodium  (VOLTAREN ) 1 % GEL Apply 2 g topically. 03/07/15   [provider]  diltiazem  (CARDIZEM ) 90 MG tablet Take 1 capsule by mouth daily. 03/01/21   [provider]  doxepin  (SINEQUAN ) 10 MG capsule Take 10 mg by mouth at bedtime. 05/11/21   [provider]  ezetimibe  (ZETIA ) 10 MG tablet Take 1 tablet (10 mg total) by mouth daily. 11/11/19   Dettinger, Fonda LABOR, MD  ferrous sulfate  325 (65 FE) MG EC tablet Take 1 tablet (325 mg total) by mouth daily with breakfast. 06/12/23   Lamon Pleasant M, PA-C  fluticasone  (FLONASE ) 50 MCG/ACT nasal spray USE 2 SPRAYS IN EACH NOSTRIL EVERY DAY AS NEEDED Patient taking differently: Place 2 sprays into both nostrils daily as needed for allergies. 01/15/20   Dettinger, Fonda LABOR, MD  folic acid  (FOLVITE ) 800 MCG tablet Take 1 tablet (800 mcg  total) by mouth daily. 06/12/23   Lamon Pleasant HERO, PA-C  furosemide  (LASIX ) 20 MG tablet Take 20 mg by mouth daily.    [provider]  gabapentin  (NEURONTIN ) 100 MG capsule Take 100 mg by mouth.    [provider]  ipratropium (ATROVENT ) 0.02 % nebulizer solution Take 2.5 mLs (0.5 mg total) by nebulization every 6 (six) hours as needed for wheezing or shortness of breath. 07/20/21   Jude Harden GAILS, MD  lactulose  (CONSTULOSE ) 10 GM/15ML solution TAKE 30 ML BY MOUTH TWICE DAILY 10/24/22   Carlan, Chelsea L, NP  lidocaine   (XYLOCAINE ) 2 % jelly Apply topically. 01/13/16   [provider]  lidocaine  (XYLOCAINE ) 5 % ointment Apply 1 application topically as needed. 07/06/19   Dettinger, Fonda LABOR, MD  Milk Thistle 1000 MG CAPS Take 1,000 mg by mouth in the morning, at noon, and at bedtime.    [provider]  Multiple Vitamins-Minerals (MULTI COMPLETE PO) Take 1 tablet by mouth daily.     [provider]  OneTouch Delica Lancets 30G MISC Test BS QID Dx E11.9 05/27/19   Dettinger, Fonda LABOR, MD  oxyCODONE (OXY IR/ROXICODONE) 5 MG immediate release tablet Take 5 mg by mouth every 6 (six) hours as needed for severe pain. 01/04/22   [provider]  pantoprazole  (PROTONIX ) 40 MG tablet TAKE 1 TABLET BY MOUTH TWICE DAILY 09/04/23   Castaneda Mayorga, Toribio, MD  pravastatin  (PRAVACHOL ) 10 MG tablet TAKE 1 TABLET BY MOUTH DAILY 07/30/23   Lavona Agent, MD  sildenafil  (VIAGRA ) 100 MG tablet Take 100 mg by mouth. 04/01/23   [provider]  spironolactone  (ALDACTONE ) 50 MG tablet Take 1 tablet (50 mg total) by mouth daily. 09/12/21   Lavona Agent, MD  triamcinolone  cream (KENALOG ) 0.1 % Apply topically. 05/28/23 05/27/24  [provider]    Allergies: Allopurinol  and Fluoxetine hcl    Review of Systems  Reason unable to perform ROS: altered mental status.  All other systems reviewed and are negative.   Updated Vital Signs BP (!) 143/78   Pulse 77   Temp 98.3 F (36.8 C) (Oral)   Resp (!) 22   Ht 6' (1.829 m)   Wt (!) 172.4 kg   SpO2 97%   BMI 51.55 kg/m   Physical Exam Vitals and nursing note reviewed.  Constitutional:      General: He is not in acute distress.    Appearance: He is well-developed. He is obese. He is ill-appearing.     Comments: Chronically ill-appearing, confused, moving around in the bed  HENT:     Head: Normocephalic and atraumatic.  Eyes:     Conjunctiva/sclera: Conjunctivae normal.  Cardiovascular:     Rate and Rhythm: Normal rate and  regular rhythm.     Heart sounds: No murmur heard. Pulmonary:     Effort: Pulmonary effort is normal. No respiratory distress.     Breath sounds: Normal breath sounds.  Abdominal:     General: There is distension.     Palpations: Abdomen is soft.     Tenderness: There is no abdominal tenderness.     Comments: Midline abdominal hernia  Musculoskeletal:        General: No swelling.     Cervical back: Neck supple.     Comments: Tremulous with asterixis  Skin:    General: Skin is warm and dry.     Capillary Refill: Capillary refill takes less than 2 seconds.  Neurological:  Mental Status: He is alert.     Comments: Patient oriented to self, does not know where he is and does not answer all questions appropriately but does respond, encephalopathic.     (all labs ordered are listed, but only abnormal results are displayed) Labs Reviewed  COMPREHENSIVE METABOLIC PANEL WITH GFR - Abnormal; Notable for the following components:      Result Value   Potassium 2.8 (*)    Chloride 92 (*)    Glucose, Bld 116 (*)    Calcium 8.1 (*)    Albumin 2.2 (*)    AST 59 (*)    Total Bilirubin 3.7 (*)    All other components within normal limits  CBC - Abnormal; Notable for the following components:   RBC 3.80 (*)    Hemoglobin 11.5 (*)    HCT 34.5 (*)    Platelets 83 (*)    All other components within normal limits  AMMONIA - Abnormal; Notable for the following components:   Ammonia 38 (*)    All other components within normal limits  CBG MONITORING, ED - Abnormal; Notable for the following components:   Glucose-Capillary 121 (*)    All other components within normal limits  URINALYSIS, ROUTINE W REFLEX MICROSCOPIC    EKG: EKG Interpretation Date/Time:  Wednesday September 25 2023 14:20:09 EDT Ventricular Rate:  78 PR Interval:  191 QRS Duration:  117 QT Interval:  456 QTC Calculation: 520 R Axis:   77  Text Interpretation: Sinus rhythm Nonspecific intraventricular conduction  delay Low voltage, precordial leads Probable anteroseptal infarct, old Confirmed by Gennaro Bouchard ([id number]) on 09/25/2023 3:11:17 PM  Radiology: No results found.  {Document cardiac monitor, telemetry assessment procedure when appropriate:32947} Procedures   Medications Ordered in the ED  potassium chloride  SA (KLOR-CON  M) CR tablet 40 mEq (has no administration in time range)  potassium chloride  10 mEq in 100 mL IVPB (has no administration in time range)  lactulose  (CHRONULAC ) 10 GM/15ML solution 10 g (has no administration in time range)      {Click here for ABCD2, HEART and other calculators REFRESH Note before signing:1}                              Medical Decision Making Amount and/or Complexity of Data Reviewed Labs: ordered. Radiology: ordered.  Risk Prescription drug management.   ***  {Document critical care time when appropriate  Document review of labs and clinical decision tools ie [id number], etc  Document your independent review of radiology images and any outside records  Document your discussion with family members, caretakers and with consultants  Document social determinants of health affecting pt's care  Document your decision making why or why not admission, treatments were needed:32947:::1}   Final diagnoses:  None    ED Discharge Orders     None

## 2023-09-25 NOTE — H&P (Addendum)
 History and Physical    [name] FMW:[id number] DOB: 07-23-1965 DOA: 09/25/2023  PCP: Carleen Dallas PARAS, FNP  Patient coming from: [facility]  I have personally briefly reviewed patient's old medical records in [facility]  Chief Complaint: AMS  HPI: [name] is a 58 y.o. male with medical history significant for diastolic CHF, asthma, atrial fibrillation, CHF, colic liver cirrhosis with esophageal varices and hepatic encephalopathy history.  Patient was brought to the ED from nursing home with reports of abnormal ammonia, and low potassium level.  At time of my evaluation, patient is awake and alert, is able to tell me his name but not much else.  He is able to talk but his speech is confused.  Per staff at nursing home, ammonia was initially in the 70s, he was given a dose of lactulose  and on repeat ammonia was 63, with altered mental status he was sent to the ED.  Patient was recently admitted at [facility], 9 days on discharged 9/12, was managed for hepatic encephalopathy, volume overload.  Admitting ammonia was greater than 100, and was 20 head CT was negative for acute abnormal.  Per discharge summary, he remained intermittently confused but was easily redirected with but no focal neurologic deficits.  ED Course: Temperature 98.3.  Heart rate 77-105.  Respiratory rate 16-22.  Blood pressure initially stable but later dropped to 80s.  Potassium 2.8.  Ammonia 38. Chest x-ray negative for acute abnormality.  Lactulose  10 g x 1 given.  IV and oral KCL given.  Hospitalist to admit for altered mental status.  Review of Systems: As per HPI all other systems reviewed and negative.  Past Medical History:  Diagnosis Date   Anxiety    Atrial fibrillation (HCC)    Blood clot in vein    blood clot in portal vein   CHF (congestive heart failure) (HCC)    Cirrhosis (HCC)    NASH   Constipation    COPD (chronic obstructive pulmonary disease) (HCC)    Dysrhythmia     GERD (gastroesophageal reflux disease)    Gout    Leukopenia 07/08/2015   Neuromuscular disorder (HCC)    neuropathy in hands and feet   Psoriasis    RA (rheumatoid arthritis) (HCC)    Sleep apnea    cpap used- level 10 and greater   Thrombocytopenia (HCC)     Past Surgical History:  Procedure Laterality Date   ANKLE SURGERY     right ankle talor repair   CHOLECYSTECTOMY  sept 2016   CHOLECYSTECTOMY     COLONOSCOPY  05/08/2011   Procedure: COLONOSCOPY;  Surgeon: Claudis RAYMOND Rivet, MD;  [facility];  Service: Endoscopy;  Laterality: N/A;  730   COLONOSCOPY WITH PROPOFOL  N/A 05/27/2020   Procedure: COLONOSCOPY WITH PROPOFOL ;  Surgeon: Eartha Angelia Sieving, MD;  Location: [facility];  Service: Gastroenterology;  Laterality: N/A;  Patient needs a unit of platelets prior to procedure.   ESOPHAGEAL BANDING N/A 05/27/2020   Procedure: ESOPHAGEAL BANDING;  Surgeon: Eartha Angelia Sieving, MD;  [facility]: [facility];  Service: Gastroenterology;  Laterality: N/A;   ESOPHAGOGASTRODUODENOSCOPY  02/28/2011   Procedure: ESOPHAGOGASTRODUODENOSCOPY (EGD);  Surgeon: Claudis RAYMOND Rivet, MD;  [facility];  Service: Endoscopy;  Laterality: N/A;  1200   ESOPHAGOGASTRODUODENOSCOPY N/A 06/11/2012   Procedure: ESOPHAGOGASTRODUODENOSCOPY (EGD);  Surgeon: Claudis RAYMOND Rivet, MD;  [facility];  Service: Endoscopy;  Laterality: N/A;  1200  FYI patient is 400  pounds   ESOPHAGOGASTRODUODENOSCOPY (EGD) WITH PROPOFOL  N/A 04/21/2014   Procedure: ESOPHAGOGASTRODUODENOSCOPY (EGD) WITH PROPOFOL ;  Surgeon: Claudis RAYMOND Rivet, MD;  Location: [facility];  Service: Endoscopy;  Laterality: N/A;   ESOPHAGOGASTRODUODENOSCOPY (EGD) WITH PROPOFOL  N/A 05/27/2020   Procedure: ESOPHAGOGASTRODUODENOSCOPY (EGD) WITH PROPOFOL ;  Surgeon: Eartha Angelia Sieving, MD;  [facility];  Service: Gastroenterology;  Laterality: N/A;  7:30 am   ESOPHAGOGASTRODUODENOSCOPY (EGD) WITH PROPOFOL  N/A  07/29/2020   Procedure: ESOPHAGOGASTRODUODENOSCOPY (EGD) WITH PROPOFOL ;  Surgeon: Eartha Angelia Sieving, MD;  [facility];  Service: Gastroenterology;  Laterality: N/A;  8:20   ESOPHAGOGASTRODUODENOSCOPY (EGD) WITH PROPOFOL  N/A 07/31/2022   Procedure: ESOPHAGOGASTRODUODENOSCOPY (EGD) WITH PROPOFOL ;  Surgeon: Eartha Angelia Sieving, MD;  [facility];  Service: Gastroenterology;  Laterality: N/A;  9:30am;asa 3   HERNIA REPAIR Right    as child; inguinal   HERNIA REPAIR  sept 2016   LIVER BIOPSY  2012   SCIATIC NERVE EXPLORATION     TONSILLECTOMY       reports that he has never smoked. His smokeless tobacco use includes snuff and chew. He reports current alcohol use of about 4.0 standard drinks of alcohol per week. He reports that he does not use drugs.  Allergies  Allergen Reactions   Allopurinol  Other (See Comments)    Joint pain worse    Fluoxetine Hcl Itching    Family History  Problem Relation Age of Onset   Colon cancer Mother    Cancer Mother        intestine   Cancer Father        throat   Cancer Brother        brain   Cancer Sister    Heart attack Neg Hx    Stroke Neg Hx     Prior to Admission medications   Medication Sig Start Date End Date Taking? Authorizing Provider  albuterol  (VENTOLIN  HFA) 108 (90 Base) MCG/ACT inhaler INHALE 2 PUFFS BY MOUTH EVERY 6 HOURS AS NEEDED FOR SHORTNESS OF BREATH OR WHEEZING 12/04/22   Jude Harden GAILS, MD  aspirin  EC 325 MG tablet Take 162.5 mg by mouth every 6 (six) hours as needed (chest pain).    [provider]  baclofen  (LIORESAL ) 10 MG tablet TAKE 1 TABLET BY MOUTH THREE TIMES A DAY Patient taking differently: Take 10 mg by mouth 3 (three) times daily as needed for muscle spasms. 01/07/20   Dettinger, Fonda LABOR, MD  betamethasone  dipropionate 0.05 % cream APPLY TO AFFECTED AREA TWICE A DAY Patient taking differently: Apply 1 application  topically daily as needed (itching). 01/21/19   Dettinger,  Fonda LABOR, MD  buprenorphine  (SUBUTEX ) 8 MG SUBL SL tablet Place 8 mg under the tongue every 8 (eight) hours. 07/17/22   [provider]  busPIRone  (BUSPAR ) 10 MG tablet TAKE 1 TABLET BY MOUTH THREE TIMES A DAY Patient taking differently: Take 10 mg by mouth 3 (three) times daily as needed (panic attacks). 01/15/20   Dettinger, Fonda LABOR, MD  busPIRone  (BUSPAR ) 10 MG tablet Take 1 tablet by mouth 3 (three) times daily. 08/27/22   [provider]  calcipotriene (DOVONOX) 0.005 % ointment APPLY TO THE AFFECTED AREA(S) TOPICALLY TWICE DAILY 01/29/23   [provider]  carvedilol  (COREG ) 12.5 MG tablet Take 1 tablet (12.5 mg total) by mouth 2 (two) times daily with a meal. 08/17/22   Eartha Angelia, Sieving, MD  clobetasol (TEMOVATE) 0.05 % external solution Apply topically. 05/28/23 05/27/24  [provider]  cyanocobalamin  (V-R VITAMIN B-12) 500 MCG tablet Take 1 tablet (500 mcg total) by mouth every Monday, Wednesday, and Friday. 06/12/23   Lamon Pleasant HERO, PA-C  diclofenac Sodium  (VOLTAREN ) 1 % GEL Apply 2 g topically. 03/07/15   [provider]  diltiazem  (CARDIZEM ) 90 MG tablet Take 1 capsule by mouth daily. 03/01/21   [provider]  doxepin  (SINEQUAN ) 10 MG capsule Take 10 mg by mouth at bedtime. 05/11/21   [provider]  ezetimibe  (ZETIA ) 10 MG tablet Take 1 tablet (10 mg total) by mouth daily. 11/11/19   Dettinger, Fonda LABOR, MD  ferrous sulfate  325 (65 FE) MG EC tablet Take 1 tablet (325 mg total) by mouth daily with breakfast. 06/12/23   Lamon Pleasant M, PA-C  fluticasone  (FLONASE ) 50 MCG/ACT nasal spray USE 2 SPRAYS IN EACH NOSTRIL EVERY DAY AS NEEDED Patient taking differently: Place 2 sprays into both nostrils daily as needed for allergies. 01/15/20   Dettinger, Fonda LABOR, MD  folic acid  (FOLVITE ) 800 MCG tablet Take 1 tablet (800 mcg total) by mouth daily. 06/12/23   Lamon Pleasant HERO, PA-C  furosemide  (LASIX ) 20 MG tablet Take 20  mg by mouth daily.    [provider]  gabapentin  (NEURONTIN ) 100 MG capsule Take 100 mg by mouth.    [provider]  ipratropium (ATROVENT ) 0.02 % nebulizer solution Take 2.5 mLs (0.5 mg total) by nebulization every 6 (six) hours as needed for wheezing or shortness of breath. 07/20/21   Jude Harden GAILS, MD  lactulose  (CONSTULOSE ) 10 GM/15ML solution TAKE 30 ML BY MOUTH TWICE DAILY 10/24/22   Carlan, Chelsea L, NP  lidocaine  (XYLOCAINE ) 2 % jelly Apply topically. 01/13/16   [provider]  lidocaine  (XYLOCAINE ) 5 % ointment Apply 1 application topically as needed. 07/06/19   Dettinger, Fonda LABOR, MD  Milk Thistle 1000 MG CAPS Take 1,000 mg by mouth in the morning, at noon, and at bedtime.    [provider]  Multiple Vitamins-Minerals (MULTI COMPLETE PO) Take 1 tablet by mouth daily.     [provider]  OneTouch Delica Lancets 30G MISC Test BS QID Dx E11.9 05/27/19   Dettinger, Fonda LABOR, MD  oxyCODONE (OXY IR/ROXICODONE) 5 MG immediate release tablet Take 5 mg by mouth every 6 (six) hours as needed for severe pain. 01/04/22   [provider]  pantoprazole  (PROTONIX ) 40 MG tablet TAKE 1 TABLET BY MOUTH TWICE DAILY 09/04/23   Eartha Flavors, Toribio, MD  pravastatin  (PRAVACHOL ) 10 MG tablet TAKE 1 TABLET BY MOUTH DAILY 07/30/23   Lavona Agent, MD  sildenafil  (VIAGRA ) 100 MG tablet Take 100 mg by mouth. 04/01/23   [provider]  spironolactone  (ALDACTONE ) 50 MG tablet Take 1 tablet (50 mg total) by mouth daily. 09/12/21   Lavona Agent, MD  triamcinolone  cream (KENALOG ) 0.1 % Apply topically. 05/28/23 05/27/24  [provider]    Physical Exam: Limited by patient's mental status Vitals:   09/25/23 1430 09/25/23 1500 09/25/23 1545 09/25/23 1600  BP: 102/85 (!) 143/78  105/76  Pulse: 78 77    Resp: (!) 22     Temp:      TempSrc:      SpO2: 96% 97% 99%   Weight:      Height:        Constitutional: Agitated, attempting to  slide down out of bed Vitals:   09/25/23 1430 09/25/23 1500 09/25/23 1545 09/25/23 1600  BP: 102/85 (!) 143/78  105/76  Pulse: 78  77    Resp: (!) 22     Temp:      TempSrc:      SpO2: 96% 97% 99%   Weight:      Height:       Eyes: PERRL, lids and conjunctivae normal ENMT: Mucous membranes are markedly dry mucous membranes.  Neck: normal, supple, no masses, no thyromegaly Respiratory: clear to auscultation bilaterally, no wheezing, no crackles. Normal respiratory effort. No accessory muscle use.  Cardiovascular: Regular rate and rhythm, no murmurs / rubs / gallops.  Bilateral lower extremity venous stasis changes, extremities are indurated, but no apparent swelling or pitting edema.  Extremities warm.   Abdomen: obese, no tenderness, no masses palpated. No hepatosplenomegaly. Bowel sounds positive.  Musculoskeletal: no clubbing / cyanosis. No joint deformity upper and lower extremities.  Skin: no rashes, lesions, ulcers. No induration Neurologic: Exam limited by patient's altered mental status, renal extremities.  No facial asymmetry. Psychiatric: Speech is confused, he is able to tell me his name, he is awake and alert.  Labs on Admission: I have personally reviewed following labs and imaging studies  CBC: Recent Labs  Lab 09/25/23 1420  WBC 9.0  HGB 11.5*  HCT 34.5*  MCV 90.8  PLT 83*   Basic Metabolic Panel: Recent Labs  Lab 09/25/23 1420  NA 135  K 2.8*  CL 92*  CO2 30  GLUCOSE 116*  BUN 13  CREATININE 0.81  CALCIUM 8.1*   GFR: Estimated Creatinine Clearance: 160.4 mL/min (by C-G formula based on SCr of 0.81 mg/dL). Liver Function Tests: Recent Labs  Lab 09/25/23 1420  AST 59*  ALT 25  ALKPHOS 95  BILITOT 3.7*  PROT 6.7  ALBUMIN 2.2*   No results for input(s): LIPASE, AMYLASE in the last 168 hours. Recent Labs  Lab 09/25/23 1430  AMMONIA 38*   CBG: Recent Labs  Lab 09/25/23 1428  GLUCAP 121*   Radiological Exams on Admission: DG Chest 1  View Result Date: 09/25/2023 CLINICAL DATA:  Shortness of breath. EXAM: CHEST  1 VIEW COMPARISON:  February 20, 2022. FINDINGS: The heart size and mediastinal contours are within normal limits. Hypoinflation of the lungs is noted. Both lungs are clear. The visualized skeletal structures are unremarkable. IMPRESSION: No active disease. Electronically Signed   By: Lynwood Landy Raddle M.D.   On: 09/25/2023 16:09   EKG: Independently reviewed.  Ennis rhythm, rate 78, QTc prolonged 520.  Nonspecific diffuse ST abnormalities that are unchanged from prior.  Assessment/Plan Principal Problem:   Acute metabolic encephalopathy Active Problems:   Obstructive sleep apnea of adult   Hypertension, essential   Atrial fibrillation (HCC)   Cirrhosis (HCC)   Thrombocytopenia (HCC)   Hypokalemia   CHF (congestive heart failure) (HCC)   Esophageal varices (HCC)  Assessment and Plan: * Acute metabolic encephalopathy Presenting with confusion, agitation. Likely hepatic encephalopathy, he also appears quite dehydrated.  Systolic blood pressure down to 80s.  Lactulose  given prior to arrival in the ED hence improvement ammonia from 70s to 38 here.  Recent hospitalization at [facility], for same.  Questionable compliance with lactulose . -Resume lactulose  30 mL twice daily - QTc prolonged, will use Ativan  0.5 PRN for agitation - bolus, cont N/s + 20 KCL 75cc/hr x 15hrs - Chest x-ray clear - UA pending - Hold Gabapentin  doxepin , buspirone   CHF (congestive heart failure) (HCC) Chronic diastolic CHF.  Stable and compensated.  Last echo.  Per Care Everywhere last echo - 08/2023 EF 55 to 60%. -  Hydrate, he appears dehydrated -Hold Lasix  20mg  daily  Hypokalemia Potassium 2.8.  Magnesium  1.8.  QTc prolonged at 520. - replete.  Cirrhosis (HCC) Alcoholic liver cirrhosis with history of portal, splenic and mesenteric vein thrombosis, portal hypertension with esophageal varices, thrombocytopenia, hepatic  encephalopathy.  Platelets- 83, (baseline 30s to 50s). -Resume lactulose ,  -Hold Lasix , spironolactone   Atrial fibrillation (HCC) Rate controlled.  Not on anticoagulation due to high risk for GI bleed from esophageal varices and chronic thrombocytopenia. -With hypotension hold Coreg , Cardizem   Hypertension, essential Now hypotensive. -Hold Coreg , Cardizem , Lasix  20 mg,   DVT prophylaxis: SCDS Code Status: FULL Family Communication: None at bedside Disposition Plan: ~ 2 days Consults called: None Admission status: Obs tele     Author: Tully FORBES Carwin, MD 09/25/2023 5:39 PM  For on call review [email or web address].

## 2023-09-25 NOTE — ED Notes (Signed)
 Pt had a BM and soiled bed, cleaned pt up did peri care and changed bed linens

## 2023-09-25 NOTE — ED Notes (Signed)
 Pt had bm. Small yellow chunks of stool noted. Pt cleaned and bed linens changed

## 2023-09-25 NOTE — ED Notes (Signed)
 Attempted to cath, severe swelling noted to scrotum and penis . Unable to cath. Pt has been urinating. Has been cleaned x 3 since arrival.

## 2023-09-25 NOTE — Assessment & Plan Note (Addendum)
 Presenting with confusion, agitation. Likely hepatic encephalopathy, he also appears quite dehydrated.  Systolic blood pressure down to 80s.  Lactulose  given prior to arrival in the ED hence improvement ammonia from 70s to 38 here.  Recent hospitalization at [facility], for same.  Questionable compliance with lactulose . -Resume lactulose  30 mL twice daily - QTc prolonged, will use Ativan  0.5 PRN for agitation - bolus, cont N/s + 20 KCL 100cc/hr x 15hrs - Chest x-ray clear - UA pending - Hold Gabapentin  doxepin , buspirone 

## 2023-09-25 NOTE — Assessment & Plan Note (Signed)
 Now hypotensive. -Hold Coreg , Cardizem , Lasix  20 mg,

## 2023-09-25 NOTE — Assessment & Plan Note (Addendum)
 Chronic diastolic CHF.  Stable and compensated.  Last echo.  Per Care Everywhere last echo - 08/2023 EF 55 to 60%. - Hydrate, he appears dehydrated -Hold Lasix  20mg  daily

## 2023-09-25 NOTE — Assessment & Plan Note (Signed)
 Rate controlled.  Not on anticoagulation due to high risk for GI bleed from esophageal varices and chronic thrombocytopenia. -With hypotension hold Coreg , Cardizem 

## 2023-09-26 ENCOUNTER — Observation Stay (HOSPITAL_COMMUNITY)

## 2023-09-26 DIAGNOSIS — M069 Rheumatoid arthritis, unspecified: Secondary | ICD-10-CM | POA: Diagnosis present

## 2023-09-26 DIAGNOSIS — K55059 Acute (reversible) ischemia of intestine, part and extent unspecified: Secondary | ICD-10-CM | POA: Diagnosis present

## 2023-09-26 DIAGNOSIS — M6281 Muscle weakness (generalized): Secondary | ICD-10-CM | POA: Diagnosis not present

## 2023-09-26 DIAGNOSIS — Z558 Other problems related to education and literacy: Secondary | ICD-10-CM | POA: Diagnosis not present

## 2023-09-26 DIAGNOSIS — R2689 Other abnormalities of gait and mobility: Secondary | ICD-10-CM | POA: Diagnosis not present

## 2023-09-26 DIAGNOSIS — I4891 Unspecified atrial fibrillation: Secondary | ICD-10-CM | POA: Diagnosis not present

## 2023-09-26 DIAGNOSIS — Z515 Encounter for palliative care: Secondary | ICD-10-CM | POA: Diagnosis not present

## 2023-09-26 DIAGNOSIS — Z1152 Encounter for screening for COVID-19: Secondary | ICD-10-CM | POA: Diagnosis not present

## 2023-09-26 DIAGNOSIS — D696 Thrombocytopenia, unspecified: Secondary | ICD-10-CM | POA: Diagnosis not present

## 2023-09-26 DIAGNOSIS — R531 Weakness: Secondary | ICD-10-CM | POA: Diagnosis not present

## 2023-09-26 DIAGNOSIS — G9341 Metabolic encephalopathy: Secondary | ICD-10-CM | POA: Diagnosis present

## 2023-09-26 DIAGNOSIS — B9561 Methicillin susceptible Staphylococcus aureus infection as the cause of diseases classified elsewhere: Secondary | ICD-10-CM | POA: Diagnosis not present

## 2023-09-26 DIAGNOSIS — I5032 Chronic diastolic (congestive) heart failure: Secondary | ICD-10-CM | POA: Diagnosis present

## 2023-09-26 DIAGNOSIS — F1010 Alcohol abuse, uncomplicated: Secondary | ICD-10-CM | POA: Diagnosis present

## 2023-09-26 DIAGNOSIS — J984 Other disorders of lung: Secondary | ICD-10-CM | POA: Diagnosis not present

## 2023-09-26 DIAGNOSIS — Z743 Need for continuous supervision: Secondary | ICD-10-CM | POA: Diagnosis not present

## 2023-09-26 DIAGNOSIS — Z6841 Body Mass Index (BMI) 40.0 and over, adult: Secondary | ICD-10-CM | POA: Diagnosis not present

## 2023-09-26 DIAGNOSIS — I8510 Secondary esophageal varices without bleeding: Secondary | ICD-10-CM | POA: Diagnosis present

## 2023-09-26 DIAGNOSIS — R918 Other nonspecific abnormal finding of lung field: Secondary | ICD-10-CM | POA: Diagnosis not present

## 2023-09-26 DIAGNOSIS — K7682 Hepatic encephalopathy: Secondary | ICD-10-CM | POA: Diagnosis present

## 2023-09-26 DIAGNOSIS — E039 Hypothyroidism, unspecified: Secondary | ICD-10-CM | POA: Diagnosis present

## 2023-09-26 DIAGNOSIS — E2740 Unspecified adrenocortical insufficiency: Secondary | ICD-10-CM | POA: Diagnosis present

## 2023-09-26 DIAGNOSIS — R0989 Other specified symptoms and signs involving the circulatory and respiratory systems: Secondary | ICD-10-CM | POA: Diagnosis not present

## 2023-09-26 DIAGNOSIS — J4489 Other specified chronic obstructive pulmonary disease: Secondary | ICD-10-CM | POA: Diagnosis present

## 2023-09-26 DIAGNOSIS — D6959 Other secondary thrombocytopenia: Secondary | ICD-10-CM | POA: Diagnosis present

## 2023-09-26 DIAGNOSIS — Z7189 Other specified counseling: Secondary | ICD-10-CM | POA: Diagnosis not present

## 2023-09-26 DIAGNOSIS — R7881 Bacteremia: Secondary | ICD-10-CM | POA: Diagnosis present

## 2023-09-26 DIAGNOSIS — E876 Hypokalemia: Secondary | ICD-10-CM | POA: Diagnosis not present

## 2023-09-26 DIAGNOSIS — K7031 Alcoholic cirrhosis of liver with ascites: Secondary | ICD-10-CM | POA: Diagnosis present

## 2023-09-26 DIAGNOSIS — Z452 Encounter for adjustment and management of vascular access device: Secondary | ICD-10-CM | POA: Diagnosis not present

## 2023-09-26 DIAGNOSIS — E871 Hypo-osmolality and hyponatremia: Secondary | ICD-10-CM | POA: Diagnosis present

## 2023-09-26 DIAGNOSIS — E8809 Other disorders of plasma-protein metabolism, not elsewhere classified: Secondary | ICD-10-CM | POA: Diagnosis present

## 2023-09-26 DIAGNOSIS — K766 Portal hypertension: Secondary | ICD-10-CM | POA: Diagnosis present

## 2023-09-26 DIAGNOSIS — R278 Other lack of coordination: Secondary | ICD-10-CM | POA: Diagnosis not present

## 2023-09-26 DIAGNOSIS — I110 Hypertensive heart disease with heart failure: Secondary | ICD-10-CM | POA: Diagnosis present

## 2023-09-26 DIAGNOSIS — E860 Dehydration: Secondary | ICD-10-CM | POA: Diagnosis present

## 2023-09-26 DIAGNOSIS — I38 Endocarditis, valve unspecified: Secondary | ICD-10-CM | POA: Diagnosis present

## 2023-09-26 DIAGNOSIS — I480 Paroxysmal atrial fibrillation: Secondary | ICD-10-CM | POA: Diagnosis present

## 2023-09-26 DIAGNOSIS — K7030 Alcoholic cirrhosis of liver without ascites: Secondary | ICD-10-CM | POA: Diagnosis not present

## 2023-09-26 DIAGNOSIS — D509 Iron deficiency anemia, unspecified: Secondary | ICD-10-CM | POA: Diagnosis present

## 2023-09-26 LAB — RESPIRATORY PANEL BY PCR

## 2023-09-26 LAB — CBC
HCT: 30.2 % — ABNORMAL LOW (ref 39.0–52.0)
Hemoglobin: 10.2 g/dL — ABNORMAL LOW (ref 13.0–17.0)
MCH: 30.8 pg (ref 26.0–34.0)
MCHC: 33.8 g/dL (ref 30.0–36.0)
MCV: 91.2 fL (ref 80.0–100.0)
Platelets: 49 K/uL — ABNORMAL LOW (ref 150–400)
RBC: 3.31 MIL/uL — ABNORMAL LOW (ref 4.22–5.81)
RDW: 14.4 % (ref 11.5–15.5)
WBC: 4.3 K/uL (ref 4.0–10.5)
nRBC: 0 % (ref 0.0–0.2)

## 2023-09-26 LAB — RESP PANEL BY RT-PCR (RSV, FLU A&B, COVID)  RVPGX2
Influenza A by PCR: NEGATIVE
Influenza B by PCR: NEGATIVE
Resp Syncytial Virus by PCR: NEGATIVE
SARS Coronavirus 2 by RT PCR: NEGATIVE

## 2023-09-26 LAB — BLOOD GAS, ARTERIAL
Acid-Base Excess: 6.6 mmol/L — ABNORMAL HIGH (ref 0.0–2.0)
Bicarbonate: 32.3 mmol/L — ABNORMAL HIGH (ref 20.0–28.0)
Drawn by: 41977
O2 Saturation: 97 %
Patient temperature: 37.1
pCO2 arterial: 51 mmHg — ABNORMAL HIGH (ref 32–48)
pH, Arterial: 7.41 (ref 7.35–7.45)
pO2, Arterial: 73 mmHg — ABNORMAL LOW (ref 83–108)

## 2023-09-26 LAB — URINALYSIS, W/ REFLEX TO CULTURE (INFECTION SUSPECTED)
Bilirubin Urine: NEGATIVE
Glucose, UA: NEGATIVE mg/dL
Hgb urine dipstick: NEGATIVE
Ketones, ur: NEGATIVE mg/dL
Leukocytes,Ua: NEGATIVE
Nitrite: NEGATIVE
Protein, ur: NEGATIVE mg/dL
Specific Gravity, Urine: 1.01 (ref 1.005–1.030)
pH: 6 (ref 5.0–8.0)

## 2023-09-26 LAB — MAGNESIUM
Magnesium: 1.8 mg/dL (ref 1.7–2.4)
Magnesium: 2 mg/dL (ref 1.7–2.4)

## 2023-09-26 LAB — RAPID URINE DRUG SCREEN, HOSP PERFORMED
Amphetamines: NOT DETECTED
Barbiturates: NOT DETECTED
Benzodiazepines: NOT DETECTED
Cocaine: NOT DETECTED
Opiates: NOT DETECTED
Tetrahydrocannabinol: NOT DETECTED

## 2023-09-26 LAB — BASIC METABOLIC PANEL WITH GFR
Anion gap: 9 (ref 5–15)
BUN: 12 mg/dL (ref 6–20)
CO2: 28 mmol/L (ref 22–32)
Calcium: 7.5 mg/dL — ABNORMAL LOW (ref 8.9–10.3)
Chloride: 97 mmol/L — ABNORMAL LOW (ref 98–111)
Creatinine, Ser: 0.73 mg/dL (ref 0.61–1.24)
GFR, Estimated: >60 mL/min (ref >60)
Glucose, Bld: 104 mg/dL — ABNORMAL HIGH (ref 70–99)
Potassium: 2.9 mmol/L — ABNORMAL LOW (ref 3.5–5.1)
Sodium: 134 mmol/L — ABNORMAL LOW (ref 135–145)

## 2023-09-26 LAB — HIV ANTIBODY (ROUTINE TESTING W REFLEX): HIV Screen 4th Generation wRfx: NONREACTIVE

## 2023-09-26 LAB — TSH: TSH: 31.828 u[IU]/mL — ABNORMAL HIGH (ref 0.350–4.500)

## 2023-09-26 LAB — CORTISOL: Cortisol, Plasma: 6.9 ug/dL

## 2023-09-26 LAB — T4, FREE: Free T4: 0.61 ng/dL (ref 0.61–1.12)

## 2023-09-26 LAB — FOLATE: Folate: 15 ng/mL (ref >5.9)

## 2023-09-26 LAB — VITAMIN B12: Vitamin B-12: 2954 pg/mL — ABNORMAL HIGH (ref 180–914)

## 2023-09-26 LAB — AMMONIA: Ammonia: 39 umol/L — ABNORMAL HIGH (ref 9–35)

## 2023-09-26 LAB — PHOSPHORUS: Phosphorus: 3.2 mg/dL (ref 2.5–4.6)

## 2023-09-26 MED ORDER — POTASSIUM CHLORIDE 10 MEQ/100ML IV SOLN
10.0000 meq | INTRAVENOUS | Status: DC
  Administered 2023-09-26: 10 meq via INTRAVENOUS
  Filled 2023-09-26: qty 100

## 2023-09-26 MED ORDER — LACTULOSE 10 GM/15ML PO SOLN
20.0000 g | Freq: Three times a day (TID) | ORAL | Status: DC
  Administered 2023-09-26 (×2): 20 g via ORAL
  Filled 2023-09-26: qty 30

## 2023-09-26 MED ORDER — POTASSIUM CHLORIDE 10 MEQ/100ML IV SOLN
10.0000 meq | INTRAVENOUS | Status: AC
  Administered 2023-09-26 (×5): 10 meq via INTRAVENOUS
  Filled 2023-09-26 (×5): qty 100

## 2023-09-26 MED ORDER — POTASSIUM CHLORIDE CRYS ER 20 MEQ PO TBCR: 40.0000 meq | EXTENDED_RELEASE_TABLET | Freq: Once | ORAL | Status: DC

## 2023-09-26 MED ORDER — POTASSIUM CHLORIDE IN NACL 40-0.9 MEQ/L-% IV SOLN: INTRAVENOUS | Status: AC

## 2023-09-26 MED ORDER — METOPROLOL TARTRATE 5 MG/5ML IV SOLN
2.5000 mg | Freq: Once | INTRAVENOUS | Status: AC
Start: 2023-09-26 — End: 2023-09-26
  Administered 2023-09-26: 2.5 mg via INTRAVENOUS
  Filled 2023-09-26: qty 5

## 2023-09-26 MED ORDER — MAGNESIUM SULFATE 2 GM/50ML IV SOLN
2.0000 g | Freq: Once | INTRAVENOUS | Status: AC
  Administered 2023-09-26: 2 g via INTRAVENOUS
  Filled 2023-09-26: qty 50

## 2023-09-26 MED ORDER — METOPROLOL TARTRATE 5 MG/5ML IV SOLN
5.0000 mg | Freq: Four times a day (QID) | INTRAVENOUS | Status: AC
  Administered 2023-09-26 – 2023-10-01 (×23): 5 mg via INTRAVENOUS
  Filled 2023-09-26 (×23): qty 5

## 2023-09-26 NOTE — Progress Notes (Signed)
 1930: Patient arrives to floor confused, but alert to self and sometimes knows he is in the hospital.   2100: Patient incontinent of bowel and bladder. Patient had large liquid stool, patient refused to removed soiled gown and began swinging at staff. Patient reluctant to follow commands and very restless in the bed. Took 3 RN s to complete bed change.  Patient pulling at lines and tubes and attempting to get out of bed. Hallucinating about needing to get up and we need to leave as there was something underneath us  and or him. Attempted to place pt in mitts, pt got verbally aggressive and lifted arm to swing.   Dr. Pearlean notified of events. PRN ativan  given & one time dose of Geodon  given an hour later.   0000 Safety sitter continued at bedside. Patient now sleeping comfortably.

## 2023-09-26 NOTE — Hospital Course (Addendum)
   58 year old male with a history of HFpEF, paroxysmal atrial fibrillation, alcoholic liver cirrhosis, portal hypertension with esophageal varices, thrombocytopenia, COPD, OSA, portal/splenic/mesenteric vein thrombosis, chronic back pain obesity presented from [facility] for abnormal labs.  Apparently, it was reported that the patient had an elevated ammonia in the 70s.  The patient was given a dose of lactulose .  Repeat ammonia was 63.  The patient remained confused.  The patient also had low potassium.  As result he was sent to emergency department for further evaluation and treatment.    Most recently, the patient was admitted to [facility] from 09/12/2023 to 09/20/2023.  He was treated for hepatic encephalopathy and anasarca/fluid overload.  He was initially noted to have an ammonia level >100 with a history of poor compliance with lactulose .  He was noted at that time that the patient had recent cessation of alcohol about 3 weeks prior to the admission. It was noted that the patient remained intermittently confused despite improving ammonia, but he was easily redirected.  Even on the day of discharge, the patient was pleasantly confused but easily redirected. During that hospitalization he was treated for suspected cellulitis of his right lower extremity with Zosyn.  He was transition to Augmentin.  He was discharged with torsemide  20 mg daily.  He was also noted to have another hospitalization recently at [facility] from 08/18/2023 to 08/30/2023.  The patient was transferred to [facility] from OSH secondary to volume overload and CT evidence showing portal, SMV, and splenic vein thrombosis.  He underwent EGD on 08/20/2023 which showed grade 1 esophageal varices in the esophagus without bleeding.  There were multiple hyperplastic and inflammatory polyps in the antrum.  There is no gastric varices.  GI recommended against anticoagulation given the patient's pancytopenia, presence of varices, and poor compliance.    His hospitalization was complicated by acute metabolic/hepatic encephalopathy.  His home sedating medications including BuSpar , Subutex , baclofen , and Cymbalta  were held.  Ammonia level was elevated 80.  Subsequently, his baclofen  and Subutex  were resumed which he appeared to be tolerating.  His Cymbalta  was decreased to 60 mg at nighttime.  He was discharged with lactulose  20 g twice daily.  He also had AKI secondary to overdiuresis with serum creatinine peaking at 3.14.

## 2023-09-26 NOTE — TOC Initial Note (Signed)
 Transition of Care [facility]) - Initial/Assessment Note    Patient Details  Name: [name] MRN: [id number] Date of Birth: 16-Mar-1965  Transition of [facility]) CM/SW Contact:    Sharlyne Stabs, RN Phone Number: 09/26/2023, 12:52 PM  Clinical Narrative:      Patient admitted from [facility].  Patient is confused, CM called his niece. She states he went to [facility] recently after a stroke, He has a house in [place] and room mates.  As far as she knows he still drives. The plan is to return to [facility]. TOC following to start INS Auth.           Expected Discharge Plan: Skilled Nursing Facility Barriers to Discharge: Continued Medical Work up   Patient Goals and CMS Choice Patient states their goals for this hospitalization and ongoing recovery are:: Return to [email or web address] Compare Post Acute Care list provided to:: Patient Choice offered to / list presented to : Patient     Expected Discharge Plan and Services      Living arrangements for the past 2 months: Single Family Home            Prior Living Arrangements/Services Living arrangements for the past 2 months: Single Family Home Lives with:: Roommate Patient language and need for interpreter reviewed:: Yes Do you feel safe going back to the place where you live?: Yes      Need for Family Participation in Patient Care: Yes (Comment) Care giver support system in place?: Yes (comment)   Criminal Activity/Legal Involvement Pertinent to Current Situation/Hospitalization: No - Comment as needed  Activities of Daily Living   ADL Screening (condition at time of admission) Independently performs ADLs?: No Does the patient have a NEW difficulty with bathing/dressing/toileting/self-feeding that is expected to last >3 days?: Yes (Initiates electronic notice to provider for possible OT consult) Does the patient have a NEW difficulty with getting in/out of bed, walking, or climbing stairs that is  expected to last >3 days?: Yes (Initiates electronic notice to provider for possible PT consult) Does the patient have a NEW difficulty with communication that is expected to last >3 days?: Yes (Initiates electronic notice to provider for possible SLP consult) Is the patient deaf or have difficulty hearing?: No Does the patient have difficulty seeing, even when wearing glasses/contacts?: No Does the patient have difficulty concentrating, remembering, or making decisions?: Yes  Permission Sought/Granted        Emotional Assessment       Orientation: : Oriented to Self Alcohol / Substance Use: Not Applicable Psych Involvement: No (comment)  Admission diagnosis:  Hepatic encephalopathy (HCC) [K76.82] Hypokalemia [E87.6] Acute metabolic encephalopathy [G93.41] Alcoholic cirrhosis, unspecified whether ascites present (HCC) [K70.30] Patient Active Problem List   Diagnosis Date Noted   Acute metabolic encephalopathy 09/25/2023   Hyponatremia 04/04/2021   Esophageal varices (HCC) 04/28/2020   History of colonic polyps 04/28/2020   Alcohol abuse 04/28/2020   Acute shoulder pain due to trauma, right 10/12/2019   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 10/12/2019   Localized osteoarthritis of wrist 10/12/2019   Nontraumatic complete tear of right rotator cuff 10/12/2019   Cocaine abuse (HCC) 09/09/2019   1st degree AV block 08/17/2019   Lyme disease 08/17/2019   Fever of unknown origin 08/16/2019   Elevated AST (SGOT) 08/04/2019   Dyslipidemia 07/14/2019   Iron  deficiency anemia 07/08/2019   Migraine without aura 05/18/2019   Acute on chronic diastolic CHF (congestive heart  failure) (HCC) 05/14/2019   Scrotal edema 04/22/2019   Bilateral hydrocele 04/22/2019   Pancytopenia (HCC)    Long-term current use of opiate analgesic 03/21/2019   Dizziness 03/21/2019   Meralgia paresthetica of left side 03/21/2019   Polyarthropathy 03/21/2019   Arm mass, right 07/31/2017    Bilateral primary osteoarthritis of knee 04/04/2017   Depression, recurrent (HCC) 07/18/2016   Encounter for screening for other viral diseases 06/20/2016   Mixed hyperlipidemia 10/31/2015   Leukopenia 07/08/2015   Abdominal wall bulge 03/02/2015   Portal hypertension (HCC) 08/24/2014   Hypokalemia 08/24/2014   COPD, severity to be determined (HCC) 08/24/2014   Biliary colic 07/14/2014   CHF (congestive heart failure) (HCC) 07/14/2014   H/O ETOH abuse 07/14/2014   Thrombocytopenia (HCC) 07/30/2013   Chronic pain disorder 07/30/2013   Edema 07/30/2013   Genu varus    Hypogonadism male 05/05/2012   Umbilical hernia without obstruction and without gangrene 12/26/2011   Asthma 08/28/2011   Cirrhosis (HCC) 08/14/2011   UNSPECIFIED TACHYCARDIA 02/01/2010   Gout 05/22/2008   Hypertension, essential 05/22/2008   Atrial fibrillation (HCC) 05/22/2008   DIASTOLIC HEART FAILURE, CHRONIC 05/22/2008   Acute on chronic diastolic heart failure (HCC) 05/22/2008   Morbid (severe) obesity due to excess calories (HCC) 02/21/2007   Coronary atherosclerosis 02/21/2007   Obstructive sleep apnea of adult 02/04/2007   PCP:  Carleen Dallas PARAS, FNP Pharmacy:   [facility] - [place], [place] - [address] [place] [address] Phone: [phone] Fax: [phone]  [facility] - [place], [place] - [address] [place] [address] Phone: [phone] Fax: [phone]    Social Drivers of Health (SDOH) Social History: SDOH Screenings   Food Insecurity: Patient Unable To Answer (09/25/2023)  Housing: Patient Unable To Answer (09/25/2023)  Transportation Needs: Patient Unable To Answer (09/25/2023)  Utilities: Patient Unable To Answer (09/25/2023)  Recent Concern: Utilities - At Risk (08/18/2023)   Received from [facility]  Depression (PHQ2-9): Low Risk  (03/07/2020)  Financial Resource Strain: Low Risk  (07/22/2023)   Received from  [facility]  Physical Activity: Inactive (07/22/2023)   Received from [facility]  Social Connections: Moderately Isolated (07/22/2023)   Received from [facility]  Stress: No Stress Concern Present (08/18/2023)   Received from [facility]  Tobacco Use: High Risk (09/25/2023)  Health Literacy: Low Risk  (07/22/2023)   Received from [facility]

## 2023-09-26 NOTE — Progress Notes (Signed)
 PROGRESS NOTE  [name] FMW:[id number] DOB: 1966-01-08 DOA: 09/25/2023 PCP: Carleen Dallas PARAS, FNP  Brief History:    58 year old male with a history of HFpEF, paroxysmal atrial fibrillation, alcoholic liver cirrhosis, portal hypertension with esophageal varices, thrombocytopenia, COPD, OSA, portal/splenic/mesenteric vein thrombosis, chronic back pain obesity presented from [name] for abnormal labs.  Apparently, it was reported that the patient had an elevated ammonia in the 70s.  The patient was given a dose of lactulose .  Repeat ammonia was 63.  The patient remained confused.  The patient also had low potassium.  As result he was sent to emergency department for further evaluation and treatment.    Most recently, the patient was admitted to [name] from 09/12/2023 to 09/20/2023.  He was treated for hepatic encephalopathy and anasarca/fluid overload.  He was initially noted to have an ammonia level >100 with a history of poor compliance with lactulose .  He was noted at that time that the patient had recent cessation of alcohol about 3 weeks prior to the admission. It was noted that the patient remained intermittently confused despite improving ammonia, but he was easily redirected.  Even on the day of discharge, the patient was pleasantly confused but easily redirected. During that hospitalization he was treated for suspected cellulitis of his right lower extremity with Zosyn.  He was transition to Augmentin.  He was discharged with torsemide  20 mg daily.  He was also noted to have another hospitalization recently at [name] from 08/18/2023 to 08/30/2023.  The patient was transferred to [name] from OSH secondary to volume overload and CT evidence showing portal, SMV, and splenic vein thrombosis.  He underwent EGD on 08/20/2023 which showed grade 1 esophageal varices in the esophagus without bleeding.  There were multiple hyperplastic and inflammatory polyps in the antrum.  There  is no gastric varices.  GI recommended against anticoagulation given the patient's pancytopenia, presence of varices, and poor compliance.   His hospitalization was complicated by acute metabolic/hepatic encephalopathy.  His home sedating medications including BuSpar , Subutex , baclofen , and Cymbalta  were held.  Ammonia level was elevated 80.  Subsequently, his baclofen  and Subutex  were resumed which he appeared to be tolerating.  His Cymbalta  was decreased to 60 mg at nighttime.  He was discharged with lactulose  20 g twice daily.  He also had AKI secondary to overdiuresis with serum creatinine peaking at 3.14.   Assessment/Plan: Acute metabolic encephalopathy/hepatic encephalopathy - Suspect there is other underlying source of encephalopathy - Repeat ammonia - B12 - TSH - Urine drug screen - UA/urine culture - CT brain - Continue lactulose  - 09/26/2023 ABG 7.41/51/73/32 - Patient did receive Geodon  at 2158 and Ativan  1 mg IV 2057 - Minimize hypnotic medications  Paroxysmal atrial fibrillation -Currently in sinus rhythm - Start Lopressor  IV while the patient is somnolent - May need to transition to diltiazem  drip if patient develops RVR - Not a candidate for anticoagulation secondary to liver cirrhosis, lumbar cytopenia, and poor compliance  Chronic HFpEF - 08/20/2023 echo EF 55 to 60%, no WMA, normal RVF, difficult study secondary to patient's body habitus  History of alcohol abuse/polysubstance abuse - Urine drug screen  Alcoholic liver cirrhosis - Patient has history of esophageal varices and mesenteric vein thrombosis  Hypokalemia - Replete  Thrombocytopenia - Secondary to liver cirrhosis - Monitor for signs of bleeding  Morbid obesity - BMI 46.94 - Lifestyle modification        Family Communication:  no Family at bedside  Consultants:  none  Code Status:  FULL   DVT Prophylaxis:  SCDs   Procedures: As Listed in Progress Note  Above  Antibiotics: None       Subjective: Patient is somnolent but arouses to voice.  He intermittently follows one-step commands.  Otherwise review of systems is unobtainable secondary to encephalopathy.  Objective: Vitals:   09/25/23 2221 09/26/23 0348 09/26/23 0603 09/26/23 0720  BP: (!) 151/109 94/77 (!) 121/93   Pulse: 66 83 99 68  Resp: 14 11 (!) 24 16  Temp: 98.2 F (36.8 C) 98.8 F (37.1 C)    TempSrc: Axillary Axillary    SpO2: 96% 94% 98% 95%  Weight:      Height:        Intake/Output Summary (Last 24 hours) at 09/26/2023 0743 Last data filed at 09/26/2023 0258 Gross per 24 hour  Intake 900.97 ml  Output 450 ml  Net 450.97 ml   Weight change:  Exam:  General:  Pt is somnolent.  Arouses to voice.  Not in acute distress HEENT: No icterus, No thrush, No neck mass, [place]/AT Cardiovascular: RRR, S1/S2, no rubs, no gallops Respiratory: Bibasilar crackles.  Poor respiratory effort. Abdomen: Soft/+BS, non tender, non distended, no guarding Extremities: Trace lower extremity edema, No lymphangitis, No petechiae, No rashes, no synovitis   Data Reviewed: I have personally reviewed following labs and imaging studies Basic Metabolic Panel: Recent Labs  Lab 09/25/23 1420 09/26/23 0335  NA 135 134*  K 2.8* 2.9*  CL 92* 97*  CO2 30 28  GLUCOSE 116* 104*  BUN 13 12  CREATININE 0.81 0.73  CALCIUM 8.1* 7.5*  MG  --  1.8  PHOS  --  3.2   Liver Function Tests: Recent Labs  Lab 09/25/23 1420  AST 59*  ALT 25  ALKPHOS 95  BILITOT 3.7*  PROT 6.7  ALBUMIN 2.2*   No results for input(s): LIPASE, AMYLASE in the last 168 hours. Recent Labs  Lab 09/25/23 1430  AMMONIA 38*   Coagulation Profile: No results for input(s): INR, PROTIME in the last 168 hours. CBC: Recent Labs  Lab 09/25/23 1420 09/26/23 0335  WBC 9.0 4.3  HGB 11.5* 10.2*  HCT 34.5* 30.2*  MCV 90.8 91.2  PLT 83* 49*   Cardiac Enzymes: No results for input(s): CKTOTAL,  CKMB, CKMBINDEX, TROPONINI in the last 168 hours. BNP: Invalid input(s): POCBNP CBG: Recent Labs  Lab 09/25/23 1428  GLUCAP 121*   HbA1C: No results for input(s): HGBA1C in the last 72 hours. Urine analysis:    Component Value Date/Time   COLORURINE AMBER (A) 10/14/2019 1901   APPEARANCEUR CLEAR 10/14/2019 1901   APPEARANCEUR Clear 09/29/2018 1020   LABSPEC 1.015 10/14/2019 1901   PHURINE 7.0 10/14/2019 1901   GLUCOSEU NEGATIVE 10/14/2019 1901   HGBUR NEGATIVE 10/14/2019 1901   BILIRUBINUR NEGATIVE 10/14/2019 1901   BILIRUBINUR Negative 09/29/2018 1020   KETONESUR NEGATIVE 10/14/2019 1901   PROTEINUR NEGATIVE 10/14/2019 1901   NITRITE NEGATIVE 10/14/2019 1901   LEUKOCYTESUR NEGATIVE 10/14/2019 1901   Sepsis Labs: @LABRCNTIP (procalcitonin:4,lacticidven:4) )No results found for this or any previous visit (from the past 240 hours).   Scheduled Meds:  lactulose   20 g Oral BID   metoprolol  tartrate  5 mg Intravenous Q6H   Continuous Infusions:  potassium chloride       Procedures/Studies: DG Chest 1 View Result Date: 09/25/2023 CLINICAL DATA:  Shortness of breath. EXAM: CHEST  1 VIEW COMPARISON:  February 20, 2022.  FINDINGS: The heart size and mediastinal contours are within normal limits. Hypoinflation of the lungs is noted. Both lungs are clear. The visualized skeletal structures are unremarkable. IMPRESSION: No active disease. Electronically Signed   By: Lynwood Landy Raddle M.D.   On: 09/25/2023 16:09    Alm Schneider, DO  [facility]  If 7PM-7AM, please contact night-coverage [email or web address] Password [facility] 09/26/2023, 7:43 AM   LOS: 0 days

## 2023-09-26 NOTE — Plan of Care (Signed)
   Problem: Education: Goal: Knowledge of General Education information will improve Description: Including pain rating scale, medication(s)/side effects and non-pharmacologic comfort measures Outcome: Not Progressing   Problem: Health Behavior/Discharge Planning: Goal: Ability to manage health-related needs will improve Outcome: Not Progressing

## 2023-09-26 NOTE — Progress Notes (Signed)
 Progress Note  RN called due to concern about patient's skin color not looking great.  At bedside, patient was somnolent possibly still from Geodon  received earlier in the shift, however he withdraws to pain and he has history of confusion at baseline. Patient's chart was reviewed, vital signs were stable.  However, it was noted that he has a history of OSA on CPAP per one of the prior medical records (though this was not mentioned in more recent medical records) and patient is a mouth breather.  ABG was ordered.  He also has chronic thrombocytopenia due to his history of cirrhosis.  Consider placing patient on CPAP based on ABG findings.  Total time:  32 minutes This includes time reviewing the chart including progress notes, labs, EKGs, taking medical decisions, ordering labs and documenting findings.

## 2023-09-26 NOTE — Plan of Care (Signed)

## 2023-09-27 DIAGNOSIS — K7682 Hepatic encephalopathy: Secondary | ICD-10-CM

## 2023-09-27 DIAGNOSIS — Z7189 Other specified counseling: Secondary | ICD-10-CM | POA: Diagnosis not present

## 2023-09-27 DIAGNOSIS — Z558 Other problems related to education and literacy: Secondary | ICD-10-CM

## 2023-09-27 DIAGNOSIS — Z515 Encounter for palliative care: Secondary | ICD-10-CM | POA: Diagnosis not present

## 2023-09-27 DIAGNOSIS — G9341 Metabolic encephalopathy: Secondary | ICD-10-CM | POA: Diagnosis not present

## 2023-09-27 DIAGNOSIS — D696 Thrombocytopenia, unspecified: Secondary | ICD-10-CM | POA: Diagnosis not present

## 2023-09-27 DIAGNOSIS — E876 Hypokalemia: Secondary | ICD-10-CM | POA: Diagnosis not present

## 2023-09-27 LAB — CBC WITH DIFFERENTIAL/PLATELET
Abs Immature Granulocytes: 0.02 K/uL (ref 0.00–0.07)
Basophils Absolute: 0.1 K/uL (ref 0.0–0.1)
Basophils Relative: 1 %
Eosinophils Absolute: 0.1 K/uL (ref 0.0–0.5)
Eosinophils Relative: 2 %
HCT: 33.1 % — ABNORMAL LOW (ref 39.0–52.0)
Hemoglobin: 10.7 g/dL — ABNORMAL LOW (ref 13.0–17.0)
Immature Granulocytes: 0 %
Lymphocytes Relative: 17 %
Lymphs Abs: 0.8 K/uL (ref 0.7–4.0)
MCH: 30.1 pg (ref 26.0–34.0)
MCHC: 32.3 g/dL (ref 30.0–36.0)
MCV: 93.2 fL (ref 80.0–100.0)
Monocytes Absolute: 0.4 K/uL (ref 0.1–1.0)
Monocytes Relative: 8 %
Neutro Abs: 3.2 K/uL (ref 1.7–7.7)
Neutrophils Relative %: 72 %
Platelets: 55 K/uL — ABNORMAL LOW (ref 150–400)
RBC: 3.55 MIL/uL — ABNORMAL LOW (ref 4.22–5.81)
RDW: 14.4 % (ref 11.5–15.5)
WBC: 4.5 K/uL (ref 4.0–10.5)
nRBC: 0 % (ref 0.0–0.2)

## 2023-09-27 LAB — COMPREHENSIVE METABOLIC PANEL WITH GFR
ALT: 22 U/L (ref 0–44)
AST: 50 U/L — ABNORMAL HIGH (ref 15–41)
Albumin: 2 g/dL — ABNORMAL LOW (ref 3.5–5.0)
Alkaline Phosphatase: 90 U/L (ref 38–126)
Anion gap: 6 (ref 5–15)
BUN: 9 mg/dL (ref 6–20)
CO2: 28 mmol/L (ref 22–32)
Calcium: 7.8 mg/dL — ABNORMAL LOW (ref 8.9–10.3)
Chloride: 101 mmol/L (ref 98–111)
Creatinine, Ser: 0.65 mg/dL (ref 0.61–1.24)
GFR, Estimated: >60 mL/min
Glucose, Bld: 82 mg/dL (ref 70–99)
Potassium: 3.5 mmol/L (ref 3.5–5.1)
Sodium: 135 mmol/L (ref 135–145)
Total Bilirubin: 3.4 mg/dL — ABNORMAL HIGH (ref 0.0–1.2)
Total Protein: 6.3 g/dL — ABNORMAL LOW (ref 6.5–8.1)

## 2023-09-27 LAB — MAGNESIUM: Magnesium: 2 mg/dL (ref 1.7–2.4)

## 2023-09-27 LAB — AMMONIA: Ammonia: 38 umol/L — ABNORMAL HIGH (ref 9–35)

## 2023-09-27 LAB — TSH: TSH: 45.347 u[IU]/mL — ABNORMAL HIGH (ref 0.350–4.500)

## 2023-09-27 LAB — CORTISOL-AM, BLOOD: Cortisol - AM: 7.2 ug/dL (ref 6.7–22.6)

## 2023-09-27 LAB — MRSA NEXT GEN BY PCR, NASAL: MRSA by PCR Next Gen: NOT DETECTED

## 2023-09-27 MED ORDER — POTASSIUM CHLORIDE 10 MEQ/100ML IV SOLN
10.0000 meq | INTRAVENOUS | Status: AC
  Administered 2023-09-27 (×4): 10 meq via INTRAVENOUS
  Filled 2023-09-27 (×4): qty 100

## 2023-09-27 MED ORDER — CHLORHEXIDINE GLUCONATE CLOTH 2 % EX PADS
6.0000 | MEDICATED_PAD | Freq: Every day | CUTANEOUS | Status: DC
  Administered 2023-09-29 – 2023-10-05 (×6): 6 via TOPICAL

## 2023-09-27 MED ORDER — LACTULOSE 10 GM/15ML PO SOLN
30.0000 g | Freq: Three times a day (TID) | ORAL | Status: DC
  Administered 2023-09-27 – 2023-10-05 (×24): 30 g via ORAL
  Filled 2023-09-27 (×25): qty 60

## 2023-09-27 MED ORDER — COSYNTROPIN 0.25 MG IJ SOLR
0.2500 mg | Freq: Once | INTRAMUSCULAR | Status: AC
  Administered 2023-09-28: 0.25 mg via INTRAVENOUS

## 2023-09-27 MED ORDER — DILTIAZEM LOAD VIA INFUSION
10.0000 mg | Freq: Once | INTRAVENOUS | Status: AC
  Administered 2023-09-27: 10 mg via INTRAVENOUS
  Filled 2023-09-27: qty 10

## 2023-09-27 MED ORDER — THIAMINE HCL 100 MG/ML IJ SOLN
500.0000 mg | Freq: Three times a day (TID) | INTRAVENOUS | Status: AC
Start: 2023-09-27 — End: 2023-09-29
  Administered 2023-09-27 – 2023-09-29 (×6): 500 mg via INTRAVENOUS
  Filled 2023-09-27 (×6): qty 5

## 2023-09-27 MED ORDER — DILTIAZEM HCL 30 MG PO TABS
30.0000 mg | ORAL_TABLET | Freq: Four times a day (QID) | ORAL | Status: DC
  Administered 2023-09-27 (×2): 30 mg via ORAL
  Filled 2023-09-27 (×2): qty 1

## 2023-09-27 MED ORDER — DILTIAZEM HCL-DEXTROSE 125-5 MG/125ML-% IV SOLN (PREMIX)
5.0000 mg/h | INTRAVENOUS | Status: DC
  Administered 2023-09-27: 5 mg/h via INTRAVENOUS
  Administered 2023-09-28 – 2023-09-29 (×4): 15 mg/h via INTRAVENOUS
  Filled 2023-09-27 (×5): qty 125

## 2023-09-27 MED ORDER — LEVOTHYROXINE SODIUM 100 MCG/5ML IV SOLN
12.5000 ug | Freq: Every day | INTRAVENOUS | Status: DC
  Administered 2023-09-27 – 2023-09-28 (×2): 12.5 ug via INTRAVENOUS
  Filled 2023-09-27 (×2): qty 5

## 2023-09-27 NOTE — Evaluation (Signed)
 Physical Therapy Evaluation Patient Details Name: [name] MRN: [id number] DOB: Apr 03, 1965 Today's Date: 09/27/2023  History of Present Illness  [name] is a 58 y.o. male with medical history significant for diastolic CHF, asthma, atrial fibrillation, CHF, colic liver cirrhosis with esophageal varices and hepatic encephalopathy history.   Patient was brought to the ED from nursing home with reports of abnormal ammonia, and low potassium level.  At time of my evaluation, patient is awake and alert, is able to tell me his name but not much else.  He is able to talk but his speech is confused.  Per staff at nursing home, ammonia was initially in the 70s, he was given a dose of lactulose  and on repeat ammonia was 63, with altered mental status he was sent to the ED.     Patient was recently admitted at [facility], 9 days on discharged 9/12, was managed for hepatic encephalopathy, volume overload.  Admitting ammonia was greater than 100, and was 20 head CT was negative for acute abnormal.  Per discharge summary, he remained intermittently confused but was easily redirected with but no focal neurologic deficits.   Clinical Impression  Pt was lethargic and wearing mittens throughout the evaluation. He was unable to consistently follow commands. LE strength and sensation was unable to be accurately assessed due to his cognitive deficits and lethargy. Sit to stand transfers were unable to be safely attempted at this time due to his poor sitting balance. He exhibited elevated HR when sitting and the RN was made aware. Pt was left in bed with the bed alarm set and RN present. Patient transferred to a higher level of care and will need new PT consult to resume therapy when patient is medically stable.  Thank you.       If plan is discharge home, recommend the following: Two people to help with walking and/or transfers;Two people to help with bathing/dressing/bathroom;Assist for  transportation;Help with stairs or ramp for entrance;Supervision due to cognitive status;Direct supervision/assist for medications management   Can travel by private vehicle   No    Equipment Recommendations    Recommendations for Other Services       Functional Status Assessment Patient has had a recent decline in their functional status and demonstrates the ability to make significant improvements in function in a reasonable and predictable amount of time.     Precautions / Restrictions Precautions Precautions: Fall Recall of Precautions/Restrictions: Impaired Restrictions Weight Bearing Restrictions Per Provider Order: No      Mobility  Bed Mobility Overal bed mobility: Needs Assistance Bed Mobility: Supine to Sit, Sit to Supine     Supine to sit: +2 for physical assistance, Max assist, Total assist, HOB elevated Sit to supine: +2 for physical assistance, Max assist, Total assist   General bed mobility comments: Max assist; poor sitting balance; cognition limiting performance most likely; donning mittens during mobility.    Transfers                   General transfer comment: Unable to safely attempt today.    Ambulation/Gait               General Gait Details: unable to safely attempt today  Stairs            Wheelchair Mobility     Tilt Bed    Modified Rankin (Stroke Patients Only)       Balance Overall balance assessment: Needs assistance Sitting-balance support: Bilateral upper  extremity supported, Feet supported Sitting balance-Leahy Scale: Poor Sitting balance - Comments: seated at EOB                                     Pertinent Vitals/Pain Pain Assessment Pain Assessment: Faces Faces Pain Scale: Hurts little more Pain Location: general Pain Descriptors / Indicators: Grimacing, Guarding Pain Intervention(s): Limited activity within patient's tolerance, Monitored during session, Repositioned    Home  Living Family/patient expects to be discharged to:: Skilled nursing facility                        Prior Function Prior Level of Function : Patient poor historian/Family not available             Mobility Comments: Pt unable to provide baseline today. Chart indicates min A for tranfsers at SNF. ADLs Comments: Unsure of baseline.     Extremity/Trunk Assessment   Upper Extremity Assessment Upper Extremity Assessment: Defer to OT evaluation    Lower Extremity Assessment Lower Extremity Assessment: Difficult to assess due to impaired cognition       Communication   Communication Communication: Impaired Factors Affecting Communication: Difficulty expressing self    Cognition Arousal: Lethargic Behavior During Therapy: Impulsive   PT - Cognitive impairments: History of cognitive impairments, No family/caregiver present to determine baseline                         Following commands: Impaired Following commands impaired: Follows one step commands inconsistently     Cueing Cueing Techniques: Verbal cues, Gestural cues, Tactile cues     General Comments      Exercises     Assessment/Plan    PT Assessment Patient needs continued PT services  PT Problem List Decreased strength;Decreased cognition;Decreased activity tolerance;Decreased balance;Decreased mobility       PT Treatment Interventions DME instruction;Gait training;Stair training;Functional mobility training;Therapeutic activities;Therapeutic exercise;Balance training;Patient/family education    PT Goals (Current goals can be found in the Care Plan section)  Acute Rehab PT Goals PT Goal Formulation: Patient unable to participate in goal setting    Frequency Min 3X/week     Co-evaluation PT/OT/SLP Co-Evaluation/Treatment: Yes Reason for Co-Treatment: To address functional/ADL transfers;Complexity of the patient's impairments (multi-system involvement) PT goals addressed during  session: Mobility/safety with mobility;Balance OT goals addressed during session: ADL's and self-care       AM-PAC PT 6 Clicks Mobility  Outcome Measure Help needed turning from your back to your side while in a flat bed without using bedrails?: A Lot Help needed moving from lying on your back to sitting on the side of a flat bed without using bedrails?: A Lot Help needed moving to and from a bed to a chair (including a wheelchair)?: Total Help needed standing up from a chair using your arms (e.g., wheelchair or bedside chair)?: Total Help needed to walk in hospital room?: Total Help needed climbing 3-5 steps with a railing? : Total 6 Click Score: 8    End of Session   Activity Tolerance: Patient limited by lethargy;Treatment limited secondary to agitation Patient left: in bed;with call bell/phone within reach;with bed alarm set;with nursing/sitter in room Nurse Communication: Mobility status;Precautions PT Visit Diagnosis: Muscle weakness (generalized) (M62.81);Difficulty in walking, not elsewhere classified (R26.2);Unsteadiness on feet (R26.81)    Time: 1000-1020 PT Time Calculation (min) (ACUTE ONLY): 20 min   Charges:  PT Evaluation $PT Eval Moderate Complexity: 1 Mod PT Treatments $Therapeutic Activity: 8-22 mins PT General Charges $$ ACUTE PT VISIT: 1 Visit         Lacinda Fass, PT, DPT  09/27/2023, 12:02 PM

## 2023-09-27 NOTE — Plan of Care (Signed)
  Problem: Acute Rehab PT Goals(only PT should resolve) Goal: Pt Will Go Supine/Side To Sit Outcome: Progressing Flowsheets (Taken 09/27/2023 1217) Pt will go Supine/Side to Sit: with moderate assist Goal: Pt Will Transfer Bed To Chair/Chair To Bed Outcome: Progressing Flowsheets (Taken 09/27/2023 1217) Pt will Transfer Bed to Chair/Chair to Bed: with mod assist Goal: Pt Will Ambulate Outcome: Progressing Flowsheets (Taken 09/27/2023 1217) Pt will Ambulate:  10 feet  with least restrictive assistive device  with moderate assist   Lacinda Fass, PT, DPT

## 2023-09-27 NOTE — Progress Notes (Signed)
 Responded to nursing call:  afib   Subjective: Pt awakens to name.  Denies cp, sob.  Follows one step commands  Vitals:   09/27/23 1600 09/27/23 1640 09/27/23 1642 09/27/23 1700  BP: (!) 157/116 (!) 144/80 (!) 144/80 123/83  Pulse: (!) 109  (!) 110 (!) 111  Resp: 18  17 20   Temp:      TempSrc:      SpO2: 100%  100% 99%  Weight:      Height:       CV--IRRR Lung--CTA Abd--soft+BS/NT   Assessment/Plan: Paroxysmal afib with RVR -in sinus tach most of day after transfer to SDU -give dilt bolus and start dilt drip -goal HR 110-120 -optimize electrolytes     Alm Schneider, DO [facility]

## 2023-09-27 NOTE — Plan of Care (Signed)
  Problem: Education: Goal: Knowledge of General Education information will improve Description: Including pain rating scale, medication(s)/side effects and non-pharmacologic comfort measures Outcome: Progressing   Problem: Activity: Goal: Risk for activity intolerance will decrease Outcome: Progressing   Problem: Nutrition: Goal: Adequate nutrition will be maintained Outcome: Progressing   Problem: Coping: Goal: Level of anxiety will decrease Outcome: Progressing   Problem: Elimination: Goal: Will not experience complications related to bowel motility Outcome: Progressing Goal: Will not experience complications related to urinary retention Outcome: Progressing   Problem: Pain Managment: Goal: General experience of comfort will improve and/or be controlled Outcome: Progressing   Problem: Safety: Goal: Ability to remain free from injury will improve Outcome: Progressing

## 2023-09-27 NOTE — Plan of Care (Signed)
  Problem: Acute Rehab OT Goals (only OT should resolve) Goal: Pt. Will Perform Eating Flowsheets (Taken 09/27/2023 1152) Pt Will Perform Eating: with modified independence Goal: Pt. Will Perform Grooming Flowsheets (Taken 09/27/2023 1152) Pt Will Perform Grooming:  with modified independence  sitting Goal: Pt. Will Perform Upper Body Dressing Flowsheets (Taken 09/27/2023 1152) Pt Will Perform Upper Body Dressing:  with contact guard assist  sitting Goal: Pt. Will Perform Lower Body Dressing Flowsheets (Taken 09/27/2023 1152) Pt Will Perform Lower Body Dressing:  with mod assist  with adaptive equipment  sitting/lateral leans Goal: Pt. Will Transfer To Toilet Flowsheets (Taken 09/27/2023 1152) Pt Will Transfer to Toilet:  with mod assist  stand pivot transfer Goal: Pt. Will Perform Toileting-Clothing Manipulation Flowsheets (Taken 09/27/2023 1152) Pt Will Perform Toileting - Clothing Manipulation and hygiene: with mod assist Goal: Pt/Caregiver Will Perform Home Exercise Program Flowsheets (Taken 09/27/2023 1152) Pt/caregiver will Perform Home Exercise Program:  Increased strength  Increased ROM  Both right and left upper extremity  Independently  Rayshaun Needle OT, MOT

## 2023-09-27 NOTE — Progress Notes (Addendum)
 PROGRESS NOTE  [name] FMW:[id number] DOB: 03/31/1965 DOA: 09/25/2023 PCP: Carleen Dallas PARAS, FNP  Brief History:    58 year old male with a history of HFpEF, paroxysmal atrial fibrillation, alcoholic liver cirrhosis, portal hypertension with esophageal varices, thrombocytopenia, COPD, OSA, portal/splenic/mesenteric vein thrombosis, chronic back pain obesity presented from [facility] for abnormal labs.  Apparently, it was reported that the patient had an elevated ammonia in the 70s.  The patient was given a dose of lactulose .  Repeat ammonia was 63.  The patient remained confused.  The patient also had low potassium.  As result he was sent to emergency department for further evaluation and treatment.    Most recently, the patient was admitted to [facility] from 09/12/2023 to 09/20/2023.  He was treated for hepatic encephalopathy and anasarca/fluid overload.  He was initially noted to have an ammonia level >100 with a history of poor compliance with lactulose .  He was noted at that time that the patient had recent cessation of alcohol about 3 weeks prior to the admission. It was noted that the patient remained intermittently confused despite improving ammonia, but he was easily redirected.  Even on the day of discharge, the patient was pleasantly confused but easily redirected. During that hospitalization he was treated for suspected cellulitis of his right lower extremity with Zosyn.  He was transition to Augmentin.  He was discharged with torsemide  20 mg daily.  He was also noted to have another hospitalization recently at [facility] from 08/18/2023 to 08/30/2023.  The patient was transferred to [facility] from OSH secondary to volume overload and CT evidence showing portal, SMV, and splenic vein thrombosis.  He underwent EGD on 08/20/2023 which showed grade 1 esophageal varices in the esophagus without bleeding.  There were multiple hyperplastic and inflammatory polyps in the antrum.  There  is no gastric varices.  GI recommended against anticoagulation given the patient's pancytopenia, presence of varices, and poor compliance.   His hospitalization was complicated by acute metabolic/hepatic encephalopathy.  His home sedating medications including BuSpar , Subutex , baclofen , and Cymbalta  were held.  Ammonia level was elevated 80.  Subsequently, his baclofen  and Subutex  were resumed which he appeared to be tolerating.  His Cymbalta  was decreased to 60 mg at nighttime.  He was discharged with lactulose  20 g twice daily.  He also had AKI secondary to overdiuresis with serum creatinine peaking at 3.14.   Assessment/Plan: Acute metabolic encephalopathy/hepatic encephalopathy - multifactorial including hepatic encephalopathy, severe hypothyroidism, Wernicke's/Korsakoff enceph - remains encephalopathic, intermittently agitated - Repeat ammonia--38 - B12--2954 - TSH-45.347 - folate 15.0 - Urine drug screen--neg - UA--no pyuria - CT brain--neg - Continue lactulose --increase dose to 30 g TID - 09/26/2023 ABG 7.41/51/73/32 - Patient did receive Geodon  at 2158 and Ativan  1 mg IV 2057 - Minimize hypnotic medications -start high dose thiamine    Paroxysmal atrial fibrillation - Started Lopressor  IV while the patient is somnolent - start diltiazem  po - May need to transition to diltiazem  drip if patient HR remains elevated - Not a candidate for anticoagulation secondary to liver cirrhosis, thrombocytopenia, and poor compliance  Hypothyroidism -TSH 45.347, Free T4 0.61 -start low dose synthroid  IV and titrate slowly up   Chronic HFpEF - 08/20/2023 echo EF 55 to 60%, no WMA, normal RVF, difficult study secondary to patient's body habitus   History of alcohol abuse/polysubstance abuse - Urine drug screen--neg   Alcoholic liver cirrhosis - Patient has history of esophageal varices and mesenteric vein  thrombosis   Hypokalemia - Replete - mag 2.0   Thrombocytopenia - Secondary to  liver cirrhosis - Monitor for signs of bleeding   Morbid obesity - BMI 46.94 - Lifestyle modification  Goals of Care -consult palliative               Family Communication:  updated stepson 9/19   Consultants:  none   Code Status:  FULL    DVT Prophylaxis:  SCDs     Procedures: As Listed in Progress Note Above   Antibiotics: None        Subjective: Patient is awake but intermittently agitated.  Review of systems unobtainable secondary to her confusion.  Objective: Vitals:   09/26/23 2105 09/26/23 2202 09/27/23 0209 09/27/23 0548  BP:  113/80  113/81  Pulse:    (!) 108  Resp:    (!) 23  Temp: 98.2 F (36.8 C) 98.3 F (36.8 C) 98.2 F (36.8 C) 98.3 F (36.8 C)  TempSrc: Oral Oral Oral Oral  SpO2:    100%  Weight:      Height:        Intake/Output Summary (Last 24 hours) at 09/27/2023 0818 Last data filed at 09/27/2023 0440 Gross per 24 hour  Intake 946.06 ml  Output 1500 ml  Net -553.94 ml   Weight change:  Exam:  General:  Pt is alert, intermittently follows commands appropriately, not in acute distress HEENT: No icterus, No thrush, No neck mass, [place]/AT Cardiovascular: RRR, S1/S2, no rubs, no gallops Respiratory: Bibasilar rales.  No wheezing. Abdomen: Soft/+BS, non tender, non distended, no guarding Extremities: Nonpitting edema, No lymphangitis, No petechiae, No rashes, no synovitis   Data Reviewed: I have personally reviewed following labs and imaging studies Basic Metabolic Panel: Recent Labs  Lab 09/25/23 1420 09/26/23 0335 09/26/23 1313 09/27/23 0453  NA 135 134*  --  135  K 2.8* 2.9*  --  3.5  CL 92* 97*  --  101  CO2 30 28  --  28  GLUCOSE 116* 104*  --  82  BUN 13 12  --  9  CREATININE 0.81 0.73  --  0.65  CALCIUM 8.1* 7.5*  --  7.8*  MG  --  1.8 2.0 2.0  PHOS  --  3.2  --   --    Liver Function Tests: Recent Labs  Lab 09/25/23 1420 09/27/23 0453  AST 59* 50*  ALT 25 22  ALKPHOS 95 90  BILITOT 3.7* 3.4*  PROT  6.7 6.3*  ALBUMIN 2.2* 2.0*   No results for input(s): LIPASE, AMYLASE in the last 168 hours. Recent Labs  Lab 09/25/23 1430 09/26/23 1313 09/27/23 0453  AMMONIA 38* 39* 38*   Coagulation Profile: No results for input(s): INR, PROTIME in the last 168 hours. CBC: Recent Labs  Lab 09/25/23 1420 09/26/23 0335 09/27/23 0453  WBC 9.0 4.3 4.5  NEUTROABS  --   --  3.2  HGB 11.5* 10.2* 10.7*  HCT 34.5* 30.2* 33.1*  MCV 90.8 91.2 93.2  PLT 83* 49* 55*   Cardiac Enzymes: No results for input(s): CKTOTAL, CKMB, CKMBINDEX, TROPONINI in the last 168 hours. BNP: Invalid input(s): POCBNP CBG: Recent Labs  Lab 09/25/23 1428  GLUCAP 121*   HbA1C: No results for input(s): HGBA1C in the last 72 hours. Urine analysis:    Component Value Date/Time   COLORURINE YELLOW 09/26/2023 0844   APPEARANCEUR CLEAR 09/26/2023 0844   APPEARANCEUR Clear 09/29/2018 1020   LABSPEC 1.010 09/26/2023 0844  PHURINE 6.0 09/26/2023 0844   GLUCOSEU NEGATIVE 09/26/2023 0844   HGBUR NEGATIVE 09/26/2023 0844   BILIRUBINUR NEGATIVE 09/26/2023 0844   BILIRUBINUR Negative 09/29/2018 1020   KETONESUR NEGATIVE 09/26/2023 0844   PROTEINUR NEGATIVE 09/26/2023 0844   NITRITE NEGATIVE 09/26/2023 0844   LEUKOCYTESUR NEGATIVE 09/26/2023 0844   Sepsis Labs: @LABRCNTIP (procalcitonin:4,lacticidven:4) ) Recent Results (from the past 240 hours)  Resp panel by RT-PCR (RSV, Flu A&B, Covid) Anterior Nasal Swab     Status: None   Collection Time: 09/26/23  8:30 AM   Specimen: Anterior Nasal Swab  Result Value Ref Range Status   SARS Coronavirus 2 by RT PCR NEGATIVE NEGATIVE Final    Comment: (NOTE) SARS-CoV-2 target nucleic acids are NOT DETECTED.  The SARS-CoV-2 RNA is generally detectable in upper respiratory specimens during the acute phase of infection. The lowest concentration of SARS-CoV-2 viral copies this assay can detect is 138 copies/mL. A negative result does not preclude  SARS-Cov-2 infection and should not be used as the sole basis for treatment or other patient management decisions. A negative result may occur with  improper specimen collection/handling, submission of specimen other than nasopharyngeal swab, presence of viral mutation(s) within the areas targeted by this assay, and inadequate number of viral copies(<138 copies/mL). A negative result must be combined with clinical observations, patient history, and epidemiological information. The expected result is Negative.  Fact Sheet for Patients:  BloggerCourse.com  Fact Sheet for Healthcare Providers:  SeriousBroker.it  This test is no t yet approved or cleared by the [place]  FDA and  has been authorized for detection and/or diagnosis of SARS-CoV-2 by FDA under an Emergency Use Authorization (EUA). This EUA will remain  in effect (meaning this test can be used) for the duration of the COVID-19 declaration under Section 564(b)(1) of the Act, 21 U.S.C.section 360bbb-3(b)(1), unless the authorization is terminated  or revoked sooner.       Influenza A by PCR NEGATIVE NEGATIVE Final   Influenza B by PCR NEGATIVE NEGATIVE Final    Comment: (NOTE) The Xpert Xpress SARS-CoV-2/FLU/RSV plus assay is intended as an aid in the diagnosis of influenza from Nasopharyngeal swab specimens and should not be used as a sole basis for treatment. Nasal washings and aspirates are unacceptable for Xpert Xpress SARS-CoV-2/FLU/RSV testing.  Fact Sheet for Patients: BloggerCourse.com  Fact Sheet for Healthcare Providers: SeriousBroker.it  This test is not yet approved or cleared by the [place]  FDA and has been authorized for detection and/or diagnosis of SARS-CoV-2 by FDA under an Emergency Use Authorization (EUA). This EUA will remain in effect (meaning this test can be used) for the duration of  the COVID-19 declaration under Section 564(b)(1) of the Act, 21 U.S.C. section 360bbb-3(b)(1), unless the authorization is terminated or revoked.     Resp Syncytial Virus by PCR NEGATIVE NEGATIVE Final    Comment: (NOTE) Fact Sheet for Patients: BloggerCourse.com  Fact Sheet for Healthcare Providers: SeriousBroker.it  This test is not yet approved or cleared by the [place]  FDA and has been authorized for detection and/or diagnosis of SARS-CoV-2 by FDA under an Emergency Use Authorization (EUA). This EUA will remain in effect (meaning this test can be used) for the duration of the COVID-19 declaration under Section 564(b)(1) of the Act, 21 U.S.C. section 360bbb-3(b)(1), unless the authorization is terminated or revoked.  Performed at [facility], [address], [place], [place] [address]   Respiratory (~20 pathogens) panel by PCR     Status: None   Collection  Time: 09/26/23  8:30 AM   Specimen: Nasopharyngeal Swab; Respiratory  Result Value Ref Range Status   Adenovirus NOT DETECTED NOT DETECTED Final   Coronavirus 229E NOT DETECTED NOT DETECTED Final    Comment: (NOTE) The Coronavirus on the Respiratory Panel, DOES NOT test for the novel  Coronavirus (2019 nCoV)    Coronavirus HKU1 NOT DETECTED NOT DETECTED Final   Coronavirus NL63 NOT DETECTED NOT DETECTED Final   Coronavirus OC43 NOT DETECTED NOT DETECTED Final   Metapneumovirus NOT DETECTED NOT DETECTED Final   Rhinovirus / Enterovirus NOT DETECTED NOT DETECTED Final   Influenza A NOT DETECTED NOT DETECTED Final   Influenza B NOT DETECTED NOT DETECTED Final   Parainfluenza Virus 1 NOT DETECTED NOT DETECTED Final   Parainfluenza Virus 2 NOT DETECTED NOT DETECTED Final   Parainfluenza Virus 3 NOT DETECTED NOT DETECTED Final   Parainfluenza Virus 4 NOT DETECTED NOT DETECTED Final   Respiratory Syncytial Virus NOT DETECTED NOT DETECTED Final   Bordetella pertussis  NOT DETECTED NOT DETECTED Final   Bordetella Parapertussis NOT DETECTED NOT DETECTED Final   Chlamydophila pneumoniae NOT DETECTED NOT DETECTED Final   Mycoplasma pneumoniae NOT DETECTED NOT DETECTED Final    Comment: Performed at [facility], [address] , [place] [address]     Scheduled Meds:  diltiazem   30 mg Oral Q6H   lactulose   20 g Oral TID   levothyroxine   12.5 mcg Intravenous Daily   metoprolol  tartrate  5 mg Intravenous Q6H   Continuous Infusions:  Procedures/Studies: CT HEAD WO CONTRAST ( ) Result Date: 09/26/2023 CLINICAL DATA:  Mental status change, unknown cause. EXAM: CT HEAD WITHOUT CONTRAST TECHNIQUE: Contiguous axial images were obtained from the base of the skull through the vertex without intravenous contrast. RADIATION DOSE REDUCTION: This exam was performed according to the departmental dose-optimization program which includes automated exposure control, adjustment of the mA and/or kV according to patient size and/or use of iterative reconstruction technique. COMPARISON:  Head CT 04/27/2003 FINDINGS: The study is mildly motion degraded. Brain: There is no evidence of an acute infarct, intracranial hemorrhage, mass, midline shift, or extra-axial fluid collection. Cerebral volume is within normal limits for age. The ventricles are normal in size. Vascular: Calcified atherosclerosis at the skull base. No hyperdense vessel. Skull: No fracture or suspicious lesion. Sinuses/Orbits: Subtotal opacification of the left sphenoid sinus. Mild mucosal thickening in the left maxillary sinus. Small right mastoid effusion. Unremarkable orbits. Other: None. IMPRESSION: No evidence of acute intracranial abnormality. Electronically Signed   By: Dasie Hamburg M.D.   On: 09/26/2023 08:58   DG Chest 1 View Result Date: 09/25/2023 CLINICAL DATA:  Shortness of breath. EXAM: CHEST  1 VIEW COMPARISON:  February 20, 2022. FINDINGS: The heart size and mediastinal contours are within  normal limits. Hypoinflation of the lungs is noted. Both lungs are clear. The visualized skeletal structures are unremarkable. IMPRESSION: No active disease. Electronically Signed   By: Lynwood Landy Raddle M.D.   On: 09/25/2023 16:09    Alm Schneider, DO  [facility]  If 7PM-7AM, please contact night-coverage [email or web address] Password [email or web address] 09/27/2023, 8:18 AM   LOS: 1 day

## 2023-09-27 NOTE — Consult Note (Signed)
 Consultation Note Date: 09/27/2023   Patient Name: [name]  DOB: 1965-08-22  MRN: [id number]  Age / Sex: 58 y.o., male  PCP: [name] Dallas PARAS, FNP Referring Physician: Evonnie Lenis, MD  Reason for Consultation: Establishing goals of care  HPI/Patient Profile: 57 y.o. male  with past medical history of HFpEF, paroxysmal atrial fibrillation, alcoholic liver cirrhosis, portal hypertension with esophageal varices, thrombocytopenia, COPD, OSA, portal/splenic/mesenteric vein thrombosis, chronic back pain obesity admitted on 09/25/2023 with abnormal labs (elevated ammonia level and low potassium).   Patient noted to be acutely encephalopathic felt to be secondary to combination of metabolic and hepatic encephalopathy.  Ammonia level improving but remains encephalopathic.  Has required intermittent doses of sedatives for agitation.  Started on high-dose thiamine .  Also noted to have elevated heart rate secondary to A-fib started on Lopressor  IV.  Also found to have some hypothyroidism for which she was started on IV Synthroid .  Patient noted to be chronically ill with numerous hospital encounters including 5 ED visits and 2 admissions over the past 6 months.  Notes from these visits reviewed.  Appears to have been seen for hepatic encephalopathy, abdominal pain secondary to liver cirrhosis, extensive thrombus and portal/splenic veins and SMV as well as some ascites, recurrent issues with abdominal pain, as well as evaluation for A-fib, shortness of breath, and chest pain.  Patient is currently residing at [facility] skilled nursing facility.  I also see where he is followed by cardiology for his HFpEF and atrial fibrillation.  Last seen 09/05/2023 and cleared for 10-month follow-up.  Also followed by oncology as an outpatient for leukopenia/thrombocytopenia as well as iron  deficiency anemia.  PMT has been consulted to assist with goals of care conversation.  Today,  labs independently reviewed.  Electrolytes appear stable/normalized from yesterday's reading with sodium, potassium, and chloride level all normalizing.  Renal function appears stable.  Has some hypocalcemia in the setting of hypoalbuminemia but this corrects to 9.4 mg/dL.  Low albumin  levels are associated with greater disease burden and poorer long-term prognosis.AST elevated at 50 and total bili elevated at 3.4 otherwise, transaminases are normal.  This is in the setting of chronic liver disease.  CBC reviewed.  White count normal.  Mildly anemic but stable.  Low platelet count consistent with severe liver disease.  Would recommend continuing to trend.  TSH elevated at 31.82 on 9/18 today it is 45.347.  On IV Synthroid .  UDS negative.  CT of the head independently reviewed.  No acute finding noted that would explain his symptoms.  He does have some evidence of atherosclerosis of the vasculature.  Chest x-ray also independently reviewed.  Low lung volumes limited exam however, no fluid is visible.  Patient is tachycardic and mildly hypertensive.  Is afebrile.  O2 sat stable on room air.  Patient has required a total of 1 mg of IV Ativan  on 24-hour look back for agitation.  Otherwise, no symptom meds administered.  Today, patient is not able to provide any history due to encephalopathy.  Therefore, independent history obtained from nursing staff and family [name]).  Per nursing staff, patient has been quite altered and encephalopathic.  He has also been intermittently agitated.  Sometimes can say his name but otherwise cannot answer questions or have conversation.  Clinical Assessment and Goals of Care:  I have reviewed medical records including EPIC notes, labs and imaging (independently reviewed), vital signs, MAR, assessed the patient and then met with patient to discuss diagnosis prognosis, GOC, EOL wishes,  disposition and options. Collaborated directly with attending physician and TOC.  Unfortunately,  patient is not able to participate in conversation.  Unable to reach niece as she is away on a cruise.  Cruise set to return this weekend.  Spoke with stepson [name]).    I introduced Palliative Medicine as specialized medical care for people living with serious illness. It focuses on providing relief from the symptoms and stress of a serious illness. The goal is to improve quality of life for both the patient and the family.  Engaged in a detailed conversation with the stepson about the seriousness of the patient's current illness, in light of his complex medical history, and explained the potential implications this may have for his recovery and long-term well-being.  Discussed concern that while we continue to treat aggressively he has not had significant improvements in his cognitive status.  Discussed our concern that where he is now with his new baseline.  Patient's stepson reports that patient is really not able to live on his own because when he does he drinks heavily and does not care for himself.  He shares patient has been in a rehab facility lately and his health has been declining.  He shares that he has been quite ill several times in the past and encephalopathic but will typically bounce back.  He does not feel he has good quality of life at this point.  Given severity of patient's illness and stepson's concerns for quality of life at present, we discussed the limitations and potential burdens of CPR and intubation, particularly in the context of advanced age and serious underlying health conditions.  [name] states that he does not feel CPR or intubation would be in the best interest of the patient.  However, he is not able to make this decision as patient's niece is the one who makes medical decisions but he will discuss with her niece.  He also had questions about potentially involving hospice.  I feel this is very reasonable especially if patient's mental status is not improved.   Therefore, Detailed explanation of hospice provided including the hospice care philosophy, emphasizing its commitment to comfort, enhancing quality of life, and providing emotional and practical support to patients and their families during end-of-life care. Reviewed eligibility requirements and discussed the potential advantages of enrolling in hospice services.  Again, stepson will consider and speak with niece.  Discussed the importance of continued conversation with family and the medical providers regarding overall plan of care and treatment options, ensuring decisions are within the context of the patient's values and GOCs.   Questions and concerns were addressed.  The family was encouraged to call with questions or concerns.  PMT will continue to support holistically.  Primary Decision maker and health care surrogate:  OTHER, patient's niece  Code Status:  Full code    SUMMARY OF RECOMMENDATIONS    Full code/full scope Continue goals of care discussion Family is considering hospice and awaiting niece who is medical decision maker to return to town where conversation will continue. Limit sedating meds as able while still maintaining patient safety Continue safety sitter at bedside  Palliative medicine team will continue to follow for ongoing goals of care discussion, symptom management, and coordination of care.  Code Status/Advance Care Planning: Full code   Symptom Management:   Symptoms stable at present, therefore continue symptom regimen per admitting team with PMT available as needed for support    Prognosis:  Guarded  Discharge Planning: To Be  Determined      Primary Diagnoses: Present on Admission:  Acute metabolic encephalopathy  Atrial fibrillation (HCC)  Cirrhosis (HCC)  Esophageal varices (HCC)  Hypertension, essential  Hypokalemia  Obstructive sleep apnea of adult  Thrombocytopenia (HCC)    Physical Exam Constitutional:      General: He is not  in acute distress.    Appearance: He is ill-appearing.  Pulmonary:     Effort: Pulmonary effort is normal. No respiratory distress.  Skin:    General: Skin is warm and dry.  Neurological:     Mental Status: He is confused.     Comments: Does not follow commands, does not respond to questioning, drowsy, when aroused from sleep becomes agitated     Vital Signs: BP (!) 141/122 (BP Location: Left Wrist)   Pulse 100   Temp 98.6 F (37 C) (Axillary)   Resp 15   Ht 6' (1.829 m)   Wt (!) 159.9 kg   SpO2 100%   BMI 47.81 kg/m  Pain Scale: 0-10   Pain Score: 0-No pain   SpO2: SpO2: 100 % O2 Device:SpO2: 100 % O2 Flow Rate: .O2 Flow Rate (L/min): 2 L/min   Palliative Assessment/Data: Current: 20 to 30%    Billing based on MDM: High  Problems Addressed: One acute or chronic illness or injury that poses a threat to life or bodily function  Amount and/or Complexity of Data: Category 1:Assessment requiring an independent historian(s), Category 2:Independent interpretation of a test performed by another physician/other qualified health care professional (not separately reported), and Category 3:Discussion of management or test interpretation with external physician/other qualified health care professional/appropriate source (not separately reported)  Risks: n/a   Laymon CHRISTELLA Pinal, NP  Palliative Medicine Team Team phone # [phone]  Thank you for allowing the Palliative Medicine Team to assist in the care of this patient. Please utilize secure chat with additional questions, if there is no response within 30 minutes please call the above phone number.  Palliative Medicine Team providers are available by phone from 7am to 7pm daily and can be reached through the team cell phone.  Should this patient require assistance outside of these hours, please call the patient's attending physician.

## 2023-09-27 NOTE — Plan of Care (Signed)
   Problem: Coping: Goal: Level of anxiety will decrease Outcome: Progressing   Problem: Pain Managment: Goal: General experience of comfort will improve and/or be controlled Outcome: Progressing

## 2023-09-27 NOTE — Progress Notes (Signed)
 Patient remains tachycardic, Dr. Evonnie made aware, see new orders. Report called to tiffany in ICU

## 2023-09-27 NOTE — Evaluation (Signed)
 Occupational Therapy Evaluation Patient Details Name: [name] MRN: [id number] DOB: 29-May-1965 Today's Date: 09/27/2023   History of Present Illness   [name] is a 58 y.o. male with medical history significant for diastolic CHF, asthma, atrial fibrillation, CHF, colic liver cirrhosis with esophageal varices and hepatic encephalopathy history.   Patient was brought to the ED from nursing home with reports of abnormal ammonia, and low potassium level.  At time of my evaluation, patient is awake and alert, is able to tell me his name but not much else.  He is able to talk but his speech is confused.  Per staff at nursing home, ammonia was initially in the 70s, he was given a dose of lactulose  and on repeat ammonia was 63, with altered mental status he was sent to the ED.     Patient was recently admitted at [facility], 9 days on discharged 9/12, was managed for hepatic encephalopathy, volume overload.  Admitting ammonia was greater than 100, and was 20 head CT was negative for acute abnormal.  Per discharge summary, he remained intermittently confused but was easily redirected with but no focal neurologic deficits. (per MD)     Clinical Impressions Pt lethargic and wearing mittens throughout the session. Pt unable to follow commands consistently. +2 max/total assist for bed mobility today. Difficult to assess B UE strength or vision due to cognition and lethargy. Unable to safely attempt sit to stand due to very poor sitting balance. Pt demonstrated increased heart rate while seated at EOB. RN aware. Pt left in bed with bed alarm set. Pt will benefit from continued OT in the hospital to increase strength, balance, and endurance for safe ADL's.        If plan is discharge home, recommend the following:   Two people to help with walking and/or transfers;Two people to help with bathing/dressing/bathroom;A lot of help with bathing/dressing/bathroom;Assistance with  cooking/housework;Assistance with feeding;Direct supervision/assist for medications management;Assist for transportation;Help with stairs or ramp for entrance;Supervision due to cognitive status     Functional Status Assessment   Patient has had a recent decline in their functional status and demonstrates the ability to make significant improvements in function in a reasonable and predictable amount of time.     Equipment Recommendations   None recommended by OT             Precautions/Restrictions   Precautions Precautions: Fall Recall of Precautions/Restrictions: Impaired Restrictions Weight Bearing Restrictions Per Provider Order: No     Mobility Bed Mobility Overal bed mobility: Needs Assistance Bed Mobility: Supine to Sit, Sit to Supine     Supine to sit: +2 for physical assistance, Max assist, Total assist, HOB elevated Sit to supine: +2 for physical assistance, Max assist, Total assist   General bed mobility comments: Much assist; poor sitting balance; cognition limiting performance most likely; donning mittens during mobility.    Transfers                   General transfer comment: Unable to safely attempt today.      Balance Overall balance assessment: Needs assistance Sitting-balance support: Bilateral upper extremity supported, Feet supported Sitting balance-Leahy Scale: Poor Sitting balance - Comments: seated at EOB                                   ADL either performed or assessed with clinical judgement   ADL Overall  ADL's : Needs assistance/impaired Eating/Feeding: Maximal assistance;Moderate assistance   Grooming: Maximal assistance;Bed level   Upper Body Bathing: Maximal assistance;Bed level   Lower Body Bathing: Maximal assistance;Bed level   Upper Body Dressing : Maximal assistance;Bed level   Lower Body Dressing: Maximal assistance;Bed level     Toilet Transfer Details (indicate cue type and reason):  Unable to safetly attempt today. Likely much assist. Toileting- Clothing Manipulation and Hygiene: Total assistance;Bed level         General ADL Comments: Limited by cognitive status.     Vision Baseline Vision/History: 1 Wears glasses (per chart) Vision Assessment?:  (Eyes closed intermittently throughout the session.) Additional Comments: Difficult to assess at this time.     Perception Perception: Not tested       Praxis Praxis: Not tested       Pertinent Vitals/Pain Pain Assessment Pain Assessment: Faces Faces Pain Scale: Hurts little more Pain Location: general Pain Descriptors / Indicators: Grimacing, Guarding Pain Intervention(s): Monitored during session, Repositioned, Limited activity within patient's tolerance     Extremity/Trunk Assessment Upper Extremity Assessment Upper Extremity Assessment: Difficult to assess due to impaired cognition   Lower Extremity Assessment Lower Extremity Assessment: Defer to PT evaluation       Communication Communication Communication: Impaired Factors Affecting Communication: Difficulty expressing self   Cognition Arousal: Lethargic Behavior During Therapy: Impulsive Cognition: Cognition impaired   Orientation impairments: Place, Time, Situation       Executive functioning impairment (select all impairments): Sequencing OT - Cognition Comments: Pt lethargic and wearing mittens. Requesting to lie back down once seated at EOB.                 Following commands: Impaired Following commands impaired: Follows one step commands inconsistently     Cueing  General Comments   Cueing Techniques: Verbal cues;Gestural cues;Tactile cues                 Home Living Family/patient expects to be discharged to:: Skilled nursing facility                                        Prior Functioning/Environment Prior Level of Function : Patient poor historian/Family not available              Mobility Comments: Pt unable to provide baseline today. Chart indicates min A for tranfsers at SNF. ADLs Comments: Unsure of baseline.    OT Problem List: Decreased strength;Decreased range of motion;Decreased activity tolerance;Impaired balance (sitting and/or standing);Decreased cognition;Decreased safety awareness;Obesity   OT Treatment/Interventions: Self-care/ADL training;Therapeutic exercise;DME and/or AE instruction;Therapeutic activities;Cognitive remediation/compensation;Patient/family education;Balance training;Visual/perceptual remediation/compensation      OT Goals(Current goals can be found in the care plan section)   Acute Rehab OT Goals Patient Stated Goal: none stated OT Goal Formulation: Patient unable to participate in goal setting Time For Goal Achievement: 10/11/23   OT Frequency:  Min 3X/week    Co-evaluation PT/OT/SLP Co-Evaluation/Treatment: Yes Reason for Co-Treatment: To address functional/ADL transfers;Complexity of the patient's impairments (multi-system involvement)   OT goals addressed during session: ADL's and self-care                       End of Session Nurse Communication: Mobility status;Other (comment) (Notified pt's IV had come out.)  Activity Tolerance: Patient limited by lethargy Patient left: in bed;with call bell/phone within reach;with bed alarm set  OT Visit  Diagnosis: Unsteadiness on feet (R26.81);Other abnormalities of gait and mobility (R26.89);Muscle weakness (generalized) (M62.81);Other symptoms and signs involving cognitive function                Time: 8991-8977 OT Time Calculation (min): 14 min Charges:  OT General Charges $OT Visit: 1 Visit OT Evaluation $OT Eval Low Complexity: 1 Low  Evamae Rowen OT, MOT  Jayson Person 09/27/2023, 11:49 AM

## 2023-09-28 DIAGNOSIS — D696 Thrombocytopenia, unspecified: Secondary | ICD-10-CM | POA: Diagnosis not present

## 2023-09-28 DIAGNOSIS — K7682 Hepatic encephalopathy: Secondary | ICD-10-CM | POA: Diagnosis not present

## 2023-09-28 DIAGNOSIS — G9341 Metabolic encephalopathy: Secondary | ICD-10-CM | POA: Diagnosis not present

## 2023-09-28 DIAGNOSIS — E876 Hypokalemia: Secondary | ICD-10-CM | POA: Diagnosis not present

## 2023-09-28 LAB — COMPREHENSIVE METABOLIC PANEL WITH GFR
ALT: 23 U/L (ref 0–44)
AST: 55 U/L — ABNORMAL HIGH (ref 15–41)
Albumin: 2.2 g/dL — ABNORMAL LOW (ref 3.5–5.0)
Alkaline Phosphatase: 90 U/L (ref 38–126)
Anion gap: 10 (ref 5–15)
BUN: 9 mg/dL (ref 6–20)
CO2: 25 mmol/L (ref 22–32)
Calcium: 8.1 mg/dL — ABNORMAL LOW (ref 8.9–10.3)
Chloride: 101 mmol/L (ref 98–111)
Creatinine, Ser: 0.84 mg/dL (ref 0.61–1.24)
GFR, Estimated: >60 mL/min (ref >60)
Glucose, Bld: 106 mg/dL — ABNORMAL HIGH (ref 70–99)
Potassium: 3.5 mmol/L (ref 3.5–5.1)
Sodium: 136 mmol/L (ref 135–145)
Total Bilirubin: 4.1 mg/dL — ABNORMAL HIGH (ref 0.0–1.2)
Total Protein: 7 g/dL (ref 6.5–8.1)

## 2023-09-28 LAB — ACTH STIMULATION, 3 TIME POINTS
Cortisol, 30 Min: 14.4 ug/dL
Cortisol, 60 Min: 15.6 ug/dL
Cortisol, Base: 11.8 ug/dL

## 2023-09-28 LAB — AMMONIA: Ammonia: 51 umol/L — ABNORMAL HIGH (ref 9–35)

## 2023-09-28 LAB — T3, FREE: T3, Free: 1.8 pg/mL — ABNORMAL LOW (ref 2.0–4.4)

## 2023-09-28 LAB — MAGNESIUM: Magnesium: 2 mg/dL (ref 1.7–2.4)

## 2023-09-28 MED ORDER — LACTATED RINGERS IV BOLUS
500.0000 mL | Freq: Once | INTRAVENOUS | Status: AC
  Administered 2023-09-28: 500 mL via INTRAVENOUS

## 2023-09-28 MED ORDER — HYDROCORTISONE SOD SUC (PF) 100 MG IJ SOLR
50.0000 mg | Freq: Every day | INTRAMUSCULAR | Status: AC
  Administered 2023-09-28 – 2023-10-02 (×5): 50 mg via INTRAVENOUS
  Filled 2023-09-28 (×5): qty 2

## 2023-09-28 MED ORDER — ALBUMIN HUMAN 25 % IV SOLN
50.0000 g | Freq: Once | INTRAVENOUS | Status: AC
Start: 2023-09-28 — End: 2023-09-28
  Administered 2023-09-28: 50 g via INTRAVENOUS
  Filled 2023-09-28: qty 200

## 2023-09-28 MED ORDER — LEVOTHYROXINE SODIUM 100 MCG/5ML IV SOLN
25.0000 ug | Freq: Every day | INTRAVENOUS | Status: DC
  Administered 2023-09-29: 25 ug via INTRAVENOUS
  Filled 2023-09-28: qty 5

## 2023-09-28 NOTE — Progress Notes (Addendum)
 PROGRESS NOTE  [name] FMW:[id number] DOB: December 08, 1965 DOA: 09/25/2023 PCP: Carleen Dallas PARAS, FNP  Brief History:    58 year old male with a history of HFpEF, paroxysmal atrial fibrillation, alcoholic liver cirrhosis, portal hypertension with esophageal varices, thrombocytopenia, COPD, OSA, portal/splenic/mesenteric vein thrombosis, chronic back pain obesity presented from [facility] for abnormal labs.  Apparently, it was reported that the patient had an elevated ammonia in the 70s.  The patient was given a dose of lactulose .  Repeat ammonia was 63.  The patient remained confused.  The patient also had low potassium.  As result he was sent to emergency department for further evaluation and treatment.    Most recently, the patient was admitted to [facility] from 09/12/2023 to 09/20/2023.  He was treated for hepatic encephalopathy and anasarca/fluid overload.  He was initially noted to have an ammonia level >100 with a history of poor compliance with lactulose .  He was noted at that time that the patient had recent cessation of alcohol about 3 weeks prior to the admission. It was noted that the patient remained intermittently confused despite improving ammonia, but he was easily redirected.  Even on the day of discharge, the patient was pleasantly confused but easily redirected. During that hospitalization he was treated for suspected cellulitis of his right lower extremity with Zosyn.  He was transition to Augmentin.  He was discharged with torsemide  20 mg daily.  He was also noted to have another hospitalization recently at [facility] from 08/18/2023 to 08/30/2023.  The patient was transferred to [facility] from OSH secondary to volume overload and CT evidence showing portal, SMV, and splenic vein thrombosis.  He underwent EGD on 08/20/2023 which showed grade 1 esophageal varices in the esophagus without bleeding.  There were multiple hyperplastic and inflammatory polyps in the antrum.  There  is no gastric varices.  GI recommended against anticoagulation given the patient's pancytopenia, presence of varices, and poor compliance.   His hospitalization was complicated by acute metabolic/hepatic encephalopathy.  His home sedating medications including BuSpar , Subutex , baclofen , and Cymbalta  were held.  Ammonia level was elevated 80.  Subsequently, his baclofen  and Subutex  were resumed which he appeared to be tolerating.  His Cymbalta  was decreased to 60 mg at nighttime.  He was discharged with lactulose  20 g twice daily.  He also had AKI secondary to overdiuresis with serum creatinine peaking at 3.14.   Assessment/Plan: Acute metabolic encephalopathy/hepatic encephalopathy - multifactorial including hepatic encephalopathy, severe hypothyroidism, Wernicke's/Korsakoff enceph - remains encephalopathic, intermittently agitated - Repeat ammonia--38 - B12--2954 - TSH-45.347 - folate 15.0 - Urine drug screen--neg - UA--no pyuria - CT brain--neg - Continue lactulose --increase dose to 30 g TID - 09/26/2023 ABG 7.41/51/73/32 - Patient did receive Geodon  at 2158 and Ativan  1 mg IV 2057 - Minimize hypnotic medications -continue high dose thiamine    Paroxysmal atrial fibrillation with RVR - continue Lopressor  IV while the patient is somnolent - 9/19 started diltiazem  drip - Not a candidate for anticoagulation secondary to liver cirrhosis, thrombocytopenia, and poor compliance   Hypothyroidism -TSH 45.347, Free T4 0.61, Free T3 = 1.8 -start low dose synthroid  IV and titrate slowly up - increase synthroid  IV to 25 mcg daily  Relative adrenal insufficiency -cortrosyn  stimulation 11.8>>14.4>>15.6 -start solucortef   Chronic HFpEF - 08/20/2023 echo EF 55 to 60%, no WMA, normal RVF, difficult study secondary to patient's body habitus   History of alcohol abuse/polysubstance abuse - Urine drug screen--neg   Alcoholic  liver cirrhosis - Patient has history of esophageal varices and  mesenteric vein thrombosis   Hypokalemia - Replete - mag 2.0   Thrombocytopenia - Secondary to liver cirrhosis - Monitor for signs of bleeding   Morbid obesity - BMI 46.94 - Lifestyle modification   Goals of Care -consult palliative -no one currently in family has documented POA presently               Family Communication:  updated stepson 9/20   Consultants:  none   Code Status:  FULL    DVT Prophylaxis:  SCDs     Procedures: As Listed in Progress Note Above   Antibiotics: None         Subjective: Pt awakens to voice.  Denies pain or sob.  Remainder ROS not obtainable. No vomiting.  Having BMs  Objective: Vitals:   09/28/23 1500 09/28/23 1530 09/28/23 1545 09/28/23 1600  BP: (!) 150/91 129/85 (!) 123/93 121/68  Pulse: 70 (!) 121 99 (!) 106  Resp: 19 14 15 17   Temp:      TempSrc:      SpO2: 98% 97% 97% 100%  Weight:      Height:        Intake/Output Summary (Last 24 hours) at 09/28/2023 1626 Last data filed at 09/28/2023 1600 Gross per 24 hour  Intake 1019.96 ml  Output 200 ml  Net 819.96 ml   Weight change:  Exam:  General:  Pt is alert, intermittently follows commands appropriately, not in acute distress HEENT: No icterus, No thrush, No neck mass, [place]/AT Cardiovascular: IRRR, S1/S2, no rubs, no gallops Respiratory: bilateral rales.  No wheeze Abdomen: Soft/+BS, non tender, non distended, no guarding Extremities: 1 + LE edema, No lymphangitis, No petechiae, No rashes, no synovitis   Data Reviewed: I have personally reviewed following labs and imaging studies Basic Metabolic Panel: Recent Labs  Lab 09/25/23 1420 09/26/23 0335 09/26/23 1313 09/27/23 0453 09/28/23 0409  NA 135 134*  --  135 136  K 2.8* 2.9*  --  3.5 3.5  CL 92* 97*  --  101 101  CO2 30 28  --  28 25  GLUCOSE 116* 104*  --  82 106*  BUN 13 12  --  9 9  CREATININE 0.81 0.73  --  0.65 0.84  CALCIUM 8.1* 7.5*  --  7.8* 8.1*  MG  --  1.8 2.0 2.0 2.0  PHOS  --   3.2  --   --   --    Liver Function Tests: Recent Labs  Lab 09/25/23 1420 09/27/23 0453 09/28/23 0409  AST 59* 50* 55*  ALT 25 22 23   ALKPHOS 95 90 90  BILITOT 3.7* 3.4* 4.1*  PROT 6.7 6.3* 7.0  ALBUMIN  2.2* 2.0* 2.2*   No results for input(s): LIPASE, AMYLASE in the last 168 hours. Recent Labs  Lab 09/25/23 1430 09/26/23 1313 09/27/23 0453 09/28/23 0409  AMMONIA 38* 39* 38* 51*   Coagulation Profile: No results for input(s): INR, PROTIME in the last 168 hours. CBC: Recent Labs  Lab 09/25/23 1420 09/26/23 0335 09/27/23 0453  WBC 9.0 4.3 4.5  NEUTROABS  --   --  3.2  HGB 11.5* 10.2* 10.7*  HCT 34.5* 30.2* 33.1*  MCV 90.8 91.2 93.2  PLT 83* 49* 55*   Cardiac Enzymes: No results for input(s): CKTOTAL, CKMB, CKMBINDEX, TROPONINI in the last 168 hours. BNP: Invalid input(s): POCBNP CBG: Recent Labs  Lab 09/25/23 1428  GLUCAP 121*  HbA1C: No results for input(s): HGBA1C in the last 72 hours. Urine analysis:    Component Value Date/Time   COLORURINE YELLOW 09/26/2023 0844   APPEARANCEUR CLEAR 09/26/2023 0844   APPEARANCEUR Clear 09/29/2018 1020   LABSPEC 1.010 09/26/2023 0844   PHURINE 6.0 09/26/2023 0844   GLUCOSEU NEGATIVE 09/26/2023 0844   HGBUR NEGATIVE 09/26/2023 0844   BILIRUBINUR NEGATIVE 09/26/2023 0844   BILIRUBINUR Negative 09/29/2018 1020   KETONESUR NEGATIVE 09/26/2023 0844   PROTEINUR NEGATIVE 09/26/2023 0844   NITRITE NEGATIVE 09/26/2023 0844   LEUKOCYTESUR NEGATIVE 09/26/2023 0844   Sepsis Labs: @LABRCNTIP (procalcitonin:4,lacticidven:4) ) Recent Results (from the past 240 hours)  Resp panel by RT-PCR (RSV, Flu A&B, Covid) Anterior Nasal Swab     Status: None   Collection Time: 09/26/23  8:30 AM   Specimen: Anterior Nasal Swab  Result Value Ref Range Status   SARS Coronavirus 2 by RT PCR NEGATIVE NEGATIVE Final    Comment: (NOTE) SARS-CoV-2 target nucleic acids are NOT DETECTED.  The SARS-CoV-2 RNA is  generally detectable in upper respiratory specimens during the acute phase of infection. The lowest concentration of SARS-CoV-2 viral copies this assay can detect is 138 copies/mL. A negative result does not preclude SARS-Cov-2 infection and should not be used as the sole basis for treatment or other patient management decisions. A negative result may occur with  improper specimen collection/handling, submission of specimen other than nasopharyngeal swab, presence of viral mutation(s) within the areas targeted by this assay, and inadequate number of viral copies(<138 copies/mL). A negative result must be combined with clinical observations, patient history, and epidemiological information. The expected result is Negative.  Fact Sheet for Patients:  BloggerCourse.com  Fact Sheet for Healthcare Providers:  SeriousBroker.it  This test is no t yet approved or cleared by the [place]  FDA and  has been authorized for detection and/or diagnosis of SARS-CoV-2 by FDA under an Emergency Use Authorization (EUA). This EUA will remain  in effect (meaning this test can be used) for the duration of the COVID-19 declaration under Section 564(b)(1) of the Act, 21 U.S.C.section 360bbb-3(b)(1), unless the authorization is terminated  or revoked sooner.       Influenza A by PCR NEGATIVE NEGATIVE Final   Influenza B by PCR NEGATIVE NEGATIVE Final    Comment: (NOTE) The Xpert Xpress SARS-CoV-2/FLU/RSV plus assay is intended as an aid in the diagnosis of influenza from Nasopharyngeal swab specimens and should not be used as a sole basis for treatment. Nasal washings and aspirates are unacceptable for Xpert Xpress SARS-CoV-2/FLU/RSV testing.  Fact Sheet for Patients: BloggerCourse.com  Fact Sheet for Healthcare Providers: SeriousBroker.it  This test is not yet approved or cleared by the [place] FDA and has been authorized for detection and/or diagnosis of SARS-CoV-2 by FDA under an Emergency Use Authorization (EUA). This EUA will remain in effect (meaning this test can be used) for the duration of the COVID-19 declaration under Section 564(b)(1) of the Act, 21 U.S.C. section 360bbb-3(b)(1), unless the authorization is terminated or revoked.     Resp Syncytial Virus by PCR NEGATIVE NEGATIVE Final    Comment: (NOTE) Fact Sheet for Patients: BloggerCourse.com  Fact Sheet for Healthcare Providers: SeriousBroker.it  This test is not yet approved or cleared by the [place]  FDA and has been authorized for detection and/or diagnosis of SARS-CoV-2 by FDA under an Emergency Use Authorization (EUA). This EUA will remain in effect (meaning this test can be used) for the duration of the COVID-19 declaration under  Section 564(b)(1) of the Act, 21 U.S.C. section 360bbb-3(b)(1), unless the authorization is terminated or revoked.  Performed at [facility], [address], [place], [place] [address]   Respiratory (~20 pathogens) panel by PCR     Status: None   Collection Time: 09/26/23  8:30 AM   Specimen: Nasopharyngeal Swab; Respiratory  Result Value Ref Range Status   Adenovirus NOT DETECTED NOT DETECTED Final   Coronavirus 229E NOT DETECTED NOT DETECTED Final    Comment: (NOTE) The Coronavirus on the Respiratory Panel, DOES NOT test for the novel  Coronavirus (2019 nCoV)    Coronavirus HKU1 NOT DETECTED NOT DETECTED Final   Coronavirus NL63 NOT DETECTED NOT DETECTED Final   Coronavirus OC43 NOT DETECTED NOT DETECTED Final   Metapneumovirus NOT DETECTED NOT DETECTED Final   Rhinovirus / Enterovirus NOT DETECTED NOT DETECTED Final   Influenza A NOT DETECTED NOT DETECTED Final   Influenza B NOT DETECTED NOT DETECTED Final   Parainfluenza Virus 1 NOT DETECTED NOT DETECTED Final   Parainfluenza Virus 2 NOT DETECTED NOT  DETECTED Final   Parainfluenza Virus 3 NOT DETECTED NOT DETECTED Final   Parainfluenza Virus 4 NOT DETECTED NOT DETECTED Final   Respiratory Syncytial Virus NOT DETECTED NOT DETECTED Final   Bordetella pertussis NOT DETECTED NOT DETECTED Final   Bordetella Parapertussis NOT DETECTED NOT DETECTED Final   Chlamydophila pneumoniae NOT DETECTED NOT DETECTED Final   Mycoplasma pneumoniae NOT DETECTED NOT DETECTED Final    Comment: Performed at [facility], [address] [place], [place] [address]  MRSA Next Gen by PCR, Nasal     Status: None   Collection Time: 09/27/23 11:59 AM   Specimen: Nasal Mucosa; Nasal Swab  Result Value Ref Range Status   MRSA by PCR Next Gen NOT DETECTED NOT DETECTED Final    Comment: (NOTE) The GeneXpert MRSA Assay (FDA approved for NASAL specimens only), is one component of a comprehensive MRSA colonization surveillance program. It is not intended to diagnose MRSA infection nor to guide or monitor treatment for MRSA infections. Test performance is not FDA approved in patients less than 22 years old. Performed at [facility], [address], [place], [place] [address]      Scheduled Meds:  Chlorhexidine  Gluconate Cloth  6 each Topical Q0600   lactulose   30 g Oral TID   levothyroxine   12.5 mcg Intravenous Daily   metoprolol  tartrate  5 mg Intravenous Q6H   Continuous Infusions:  diltiazem  (CARDIZEM ) infusion 15 mg/hr (09/28/23 1600)   thiamine  (VITAMIN B1) injection Stopped (09/28/23 1014)    Procedures/Studies: CT HEAD WO CONTRAST ( ) Result Date: 09/26/2023 CLINICAL DATA:  Mental status change, unknown cause. EXAM: CT HEAD WITHOUT CONTRAST TECHNIQUE: Contiguous axial images were obtained from the base of the skull through the vertex without intravenous contrast. RADIATION DOSE REDUCTION: This exam was performed according to the departmental dose-optimization program which includes automated exposure control, adjustment of the mA and/or kV  according to patient size and/or use of iterative reconstruction technique. COMPARISON:  Head CT 04/27/2003 FINDINGS: The study is mildly motion degraded. Brain: There is no evidence of an acute infarct, intracranial hemorrhage, mass, midline shift, or extra-axial fluid collection. Cerebral volume is within normal limits for age. The ventricles are normal in size. Vascular: Calcified atherosclerosis at the skull base. No hyperdense vessel. Skull: No fracture or suspicious lesion. Sinuses/Orbits: Subtotal opacification of the left sphenoid sinus. Mild mucosal thickening in the left maxillary sinus. Small right mastoid effusion. Unremarkable  orbits. Other: None. IMPRESSION: No evidence of acute intracranial abnormality. Electronically Signed   By: Dasie Hamburg M.D.   On: 09/26/2023 08:58   DG Chest 1 View Result Date: 09/25/2023 CLINICAL DATA:  Shortness of breath. EXAM: CHEST  1 VIEW COMPARISON:  February 20, 2022. FINDINGS: The heart size and mediastinal contours are within normal limits. Hypoinflation of the lungs is noted. Both lungs are clear. The visualized skeletal structures are unremarkable. IMPRESSION: No active disease. Electronically Signed   By: Lynwood Landy Raddle M.D.   On: 09/25/2023 16:09    Alm Schneider, DO  [facility]  If 7PM-7AM, please contact night-coverage [email or web address] Password [email or web address] 09/28/2023, 4:26 PM   LOS: 2 days

## 2023-09-28 NOTE — Plan of Care (Signed)
   Problem: Coping: Goal: Level of anxiety will decrease Outcome: Progressing   Problem: Pain Managment: Goal: General experience of comfort will improve and/or be controlled Outcome: Progressing

## 2023-09-29 ENCOUNTER — Inpatient Hospital Stay (HOSPITAL_COMMUNITY)

## 2023-09-29 DIAGNOSIS — I480 Paroxysmal atrial fibrillation: Secondary | ICD-10-CM

## 2023-09-29 DIAGNOSIS — D696 Thrombocytopenia, unspecified: Secondary | ICD-10-CM | POA: Diagnosis not present

## 2023-09-29 DIAGNOSIS — K7682 Hepatic encephalopathy: Secondary | ICD-10-CM | POA: Diagnosis not present

## 2023-09-29 DIAGNOSIS — G9341 Metabolic encephalopathy: Secondary | ICD-10-CM | POA: Diagnosis not present

## 2023-09-29 LAB — COMPREHENSIVE METABOLIC PANEL WITH GFR
ALT: 23 U/L (ref 0–44)
AST: 47 U/L — ABNORMAL HIGH (ref 15–41)
Albumin: 2.6 g/dL — ABNORMAL LOW (ref 3.5–5.0)
Alkaline Phosphatase: 80 U/L (ref 38–126)
Anion gap: 9 (ref 5–15)
BUN: 12 mg/dL (ref 6–20)
CO2: 24 mmol/L (ref 22–32)
Calcium: 8.2 mg/dL — ABNORMAL LOW (ref 8.9–10.3)
Chloride: 103 mmol/L (ref 98–111)
Creatinine, Ser: 1.08 mg/dL (ref 0.61–1.24)
GFR, Estimated: >60 mL/min (ref >60)
Glucose, Bld: 151 mg/dL — ABNORMAL HIGH (ref 70–99)
Potassium: 2.9 mmol/L — ABNORMAL LOW (ref 3.5–5.1)
Sodium: 136 mmol/L (ref 135–145)
Total Bilirubin: 3.9 mg/dL — ABNORMAL HIGH (ref 0.0–1.2)
Total Protein: 7 g/dL (ref 6.5–8.1)

## 2023-09-29 LAB — CBC
HCT: 32.4 % — ABNORMAL LOW (ref 39.0–52.0)
Hemoglobin: 10.4 g/dL — ABNORMAL LOW (ref 13.0–17.0)
MCH: 29.7 pg (ref 26.0–34.0)
MCHC: 32.1 g/dL (ref 30.0–36.0)
MCV: 92.6 fL (ref 80.0–100.0)
Platelets: 54 K/uL — ABNORMAL LOW (ref 150–400)
RBC: 3.5 MIL/uL — ABNORMAL LOW (ref 4.22–5.81)
RDW: 15 % (ref 11.5–15.5)
WBC: 6.7 K/uL (ref 4.0–10.5)
nRBC: 0 % (ref 0.0–0.2)

## 2023-09-29 LAB — PROCALCITONIN: Procalcitonin: <0.1 ng/mL

## 2023-09-29 LAB — MAGNESIUM: Magnesium: 2.1 mg/dL (ref 1.7–2.4)

## 2023-09-29 LAB — AMMONIA: Ammonia: 45 umol/L — ABNORMAL HIGH (ref 9–35)

## 2023-09-29 LAB — BRAIN NATRIURETIC PEPTIDE: B Natriuretic Peptide: 322 pg/mL — ABNORMAL HIGH (ref 0.0–100.0)

## 2023-09-29 MED ORDER — POTASSIUM CHLORIDE 20 MEQ PO PACK
40.0000 meq | PACK | Freq: Once | ORAL | Status: AC
Start: 2023-09-29 — End: 2023-09-29
  Administered 2023-09-29: 40 meq via ORAL
  Filled 2023-09-29: qty 2

## 2023-09-29 MED ORDER — THIAMINE HCL 100 MG/ML IJ SOLN
INTRAMUSCULAR | Status: AC
  Filled 2023-09-29: qty 6

## 2023-09-29 MED ORDER — FUROSEMIDE 10 MG/ML IJ SOLN
20.0000 mg | Freq: Once | INTRAMUSCULAR | Status: AC
  Administered 2023-09-29: 20 mg via INTRAVENOUS
  Filled 2023-09-29: qty 2

## 2023-09-29 MED ORDER — DILTIAZEM HCL 60 MG PO TABS
60.0000 mg | ORAL_TABLET | Freq: Four times a day (QID) | ORAL | Status: DC
Start: 2023-09-29 — End: 2023-10-01
  Administered 2023-09-29 – 2023-10-01 (×8): 60 mg via ORAL
  Filled 2023-09-29 (×8): qty 1

## 2023-09-29 MED ORDER — LEVALBUTEROL HCL 0.63 MG/3ML IN NEBU
0.6300 mg | INHALATION_SOLUTION | Freq: Three times a day (TID) | RESPIRATORY_TRACT | Status: DC
  Administered 2023-09-29 (×2): 0.63 mg via RESPIRATORY_TRACT
  Filled 2023-09-29 (×2): qty 3

## 2023-09-29 MED ORDER — POTASSIUM CHLORIDE 10 MEQ/100ML IV SOLN
10.0000 meq | INTRAVENOUS | Status: AC
  Administered 2023-09-29 (×3): 10 meq via INTRAVENOUS
  Filled 2023-09-29 (×3): qty 100

## 2023-09-29 MED ORDER — LEVALBUTEROL HCL 0.63 MG/3ML IN NEBU
0.6300 mg | INHALATION_SOLUTION | Freq: Three times a day (TID) | RESPIRATORY_TRACT | Status: DC
  Administered 2023-09-30 (×3): 0.63 mg via RESPIRATORY_TRACT
  Filled 2023-09-29 (×4): qty 3

## 2023-09-29 MED ORDER — LEVOTHYROXINE SODIUM 100 MCG/5ML IV SOLN
37.5000 ug | Freq: Every day | INTRAVENOUS | Status: DC
  Administered 2023-09-30: 37.5 ug via INTRAVENOUS
  Filled 2023-09-29: qty 5

## 2023-09-29 NOTE — Plan of Care (Signed)
  Problem: Nutrition: Goal: Adequate nutrition will be maintained Outcome: Not Progressing   Problem: Coping: Goal: Level of anxiety will decrease Outcome: Progressing   Problem: Pain Managment: Goal: General experience of comfort will improve and/or be controlled Outcome: Progressing

## 2023-09-29 NOTE — Progress Notes (Signed)
 PROGRESS NOTE  [name] FMW:[id number] DOB: Aug 11, 1965 DOA: 09/25/2023 PCP: Carleen Dallas PARAS, FNP  Brief History:    58 year old male with a history of HFpEF, paroxysmal atrial fibrillation, alcoholic liver cirrhosis, portal hypertension with esophageal varices, thrombocytopenia, COPD, OSA, portal/splenic/mesenteric vein thrombosis, chronic back pain obesity presented from [facility] for abnormal labs.  Apparently, it was reported that the patient had an elevated ammonia in the 70s.  The patient was given a dose of lactulose .  Repeat ammonia was 63.  The patient remained confused.  The patient also had low potassium.  As result he was sent to emergency department for further evaluation and treatment.    Most recently, the patient was admitted to [facility] from 09/12/2023 to 09/20/2023.  He was treated for hepatic encephalopathy and anasarca/fluid overload.  He was initially noted to have an ammonia level >100 with a history of poor compliance with lactulose .  He was noted at that time that the patient had recent cessation of alcohol about 3 weeks prior to the admission. It was noted that the patient remained intermittently confused despite improving ammonia, but he was easily redirected.  Even on the day of discharge, the patient was pleasantly confused but easily redirected. During that hospitalization he was treated for suspected cellulitis of his right lower extremity with Zosyn.  He was transition to Augmentin.  He was discharged with torsemide  20 mg daily.  He was also noted to have another hospitalization recently at [facility] from 08/18/2023 to 08/30/2023.  The patient was transferred to [facility] from OSH secondary to volume overload and CT evidence showing portal, SMV, and splenic vein thrombosis.  He underwent EGD on 08/20/2023 which showed grade 1 esophageal varices in the esophagus without bleeding.  There were multiple hyperplastic and inflammatory polyps in the antrum.  There  is no gastric varices.  GI recommended against anticoagulation given the patient's pancytopenia, presence of varices, and poor compliance.   His hospitalization was complicated by acute metabolic/hepatic encephalopathy.  His home sedating medications including BuSpar , Subutex , baclofen , and Cymbalta  were held.  Ammonia level was elevated 80.  Subsequently, his baclofen  and Subutex  were resumed which he appeared to be tolerating.  His Cymbalta  was decreased to 60 mg at nighttime.  He was discharged with lactulose  20 g twice daily.  He also had AKI secondary to overdiuresis with serum creatinine peaking at 3.14.   Assessment/Plan: Acute metabolic encephalopathy/hepatic encephalopathy - multifactorial including hepatic encephalopathy, severe hypothyroidism, Wernicke's/Korsakoff enceph - remains encephalopathic, intermittently agitated - Repeat ammonia--38>>51>>45 - B12--2954 - TSH-45.347 - folate 15.0 - Urine drug screen--neg - UA--no pyuria - CT brain--neg - Continue lactulose --increase dose to 30 g TID - 09/26/2023 ABG 7.41/51/73/32 - Patient did receive Geodon  at 2158 and Ativan  1 mg IV 2057 - Minimize hypnotic medications -finished high dose thiamine  x 6 doses - more interactive but remains encephalopathic   Paroxysmal atrial fibrillation with RVR - continue Lopressor  IV while the patient is somnolent - 9/19 started diltiazem  drip>>transition to po 9/21 - Not a candidate for anticoagulation secondary to liver cirrhosis, thrombocytopenia, and poor compliance   Hypothyroidism -TSH 45.347, Free T4 0.61, Free T3 = 1.8 -start low dose synthroid  IV and titrate slowly up - increase synthroid  IV to 25 mcg daily   Relative adrenal insufficiency -cortrosyn  stimulation 11.8>>14.4>>15.6 -started solucortef   Chronic HFpEF - 08/20/2023 echo EF 55 to 60%, no WMA, normal RVF, difficult study secondary to patient's body habitus  History of alcohol abuse/polysubstance abuse - Urine drug  screen--neg   Alcoholic liver cirrhosis - Patient has history of esophageal varices and mesenteric vein thrombosis - now fluid overloaded>>give lasix  IV x 1 9/21   Hypokalemia - Replete - mag 2.1   Thrombocytopenia - Secondary to liver cirrhosis - Monitor for signs of bleeding   Morbid obesity - BMI 46.94 - Lifestyle modification   Goals of Care -consult palliative -no one currently in family has documented POA presently               Family Communication:  updated stepson 9/20   Consultants:  none   Code Status:  FULL    DVT Prophylaxis:  SCDs     Procedures: As Listed in Progress Note Above   Antibiotics: None       Subjective: Pt denies cp, sob, abd pain, n/v.  Had 4 BMs yesterday  Objective: Vitals:   09/29/23 1045 09/29/23 1100 09/29/23 1115 09/29/23 1153  BP: (!) 128/59 (!) 100/51 133/65 117/70  Pulse: 74 71 89   Resp: 14 13 20    Temp:      TempSrc:      SpO2: 99% 99% 98%   Weight:      Height:        Intake/Output Summary (Last 24 hours) at 09/29/2023 1249 Last data filed at 09/29/2023 1100 Gross per 24 hour  Intake 1261.44 ml  Output 550 ml  Net 711.44 ml   Weight change:  Exam:  General:  Pt is alert, follows commands appropriately, not in acute distress HEENT: No icterus, No thrush, No neck mass, [place]/AT Cardiovascular: IRRR, S1/S2, no rubs, no gallops Respiratory: scattered bilateral rales.  No wheeze Abdomen: Soft/+BS, non tender, non distended, no guarding Extremities: trace LE edema, No lymphangitis, No petechiae, No rashes, no synovitis   Data Reviewed: I have personally reviewed following labs and imaging studies Basic Metabolic Panel: Recent Labs  Lab 09/25/23 1420 09/26/23 0335 09/26/23 1313 09/27/23 0453 09/28/23 0409 09/29/23 0521  NA 135 134*  --  135 136 136  K 2.8* 2.9*  --  3.5 3.5 2.9*  CL 92* 97*  --  101 101 103  CO2 30 28  --  28 25 24   GLUCOSE 116* 104*  --  82 106* 151*  BUN 13 12  --  9 9 12    CREATININE 0.81 0.73  --  0.65 0.84 1.08  CALCIUM 8.1* 7.5*  --  7.8* 8.1* 8.2*  MG  --  1.8 2.0 2.0 2.0 2.1  PHOS  --  3.2  --   --   --   --    Liver Function Tests: Recent Labs  Lab 09/25/23 1420 09/27/23 0453 09/28/23 0409 09/29/23 0521  AST 59* 50* 55* 47*  ALT 25 22 23 23   ALKPHOS 95 90 90 80  BILITOT 3.7* 3.4* 4.1* 3.9*  PROT 6.7 6.3* 7.0 7.0  ALBUMIN  2.2* 2.0* 2.2* 2.6*   No results for input(s): LIPASE, AMYLASE in the last 168 hours. Recent Labs  Lab 09/25/23 1430 09/26/23 1313 09/27/23 0453 09/28/23 0409 09/29/23 0521  AMMONIA 38* 39* 38* 51* 45*   Coagulation Profile: No results for input(s): INR, PROTIME in the last 168 hours. CBC: Recent Labs  Lab 09/25/23 1420 09/26/23 0335 09/27/23 0453 09/29/23 0521  WBC 9.0 4.3 4.5 6.7  NEUTROABS  --   --  3.2  --   HGB 11.5* 10.2* 10.7* 10.4*  HCT 34.5* 30.2* 33.1* 32.4*  MCV 90.8 91.2 93.2 92.6  PLT 83* 49* 55* 54*   Cardiac Enzymes: No results for input(s): CKTOTAL, CKMB, CKMBINDEX, TROPONINI in the last 168 hours. BNP: Invalid input(s): POCBNP CBG: Recent Labs  Lab 09/25/23 1428  GLUCAP 121*   HbA1C: No results for input(s): HGBA1C in the last 72 hours. Urine analysis:    Component Value Date/Time   COLORURINE YELLOW 09/26/2023 0844   APPEARANCEUR CLEAR 09/26/2023 0844   APPEARANCEUR Clear 09/29/2018 1020   LABSPEC 1.010 09/26/2023 0844   PHURINE 6.0 09/26/2023 0844   GLUCOSEU NEGATIVE 09/26/2023 0844   HGBUR NEGATIVE 09/26/2023 0844   BILIRUBINUR NEGATIVE 09/26/2023 0844   BILIRUBINUR Negative 09/29/2018 1020   KETONESUR NEGATIVE 09/26/2023 0844   PROTEINUR NEGATIVE 09/26/2023 0844   NITRITE NEGATIVE 09/26/2023 0844   LEUKOCYTESUR NEGATIVE 09/26/2023 0844   Sepsis Labs: @LABRCNTIP (procalcitonin:4,lacticidven:4) ) Recent Results (from the past 240 hours)  Resp panel by RT-PCR (RSV, Flu A&B, Covid) Anterior Nasal Swab     Status: None   Collection Time: 09/26/23   8:30 AM   Specimen: Anterior Nasal Swab  Result Value Ref Range Status   SARS Coronavirus 2 by RT PCR NEGATIVE NEGATIVE Final    Comment: (NOTE) SARS-CoV-2 target nucleic acids are NOT DETECTED.  The SARS-CoV-2 RNA is generally detectable in upper respiratory specimens during the acute phase of infection. The lowest concentration of SARS-CoV-2 viral copies this assay can detect is 138 copies/mL. A negative result does not preclude SARS-Cov-2 infection and should not be used as the sole basis for treatment or other patient management decisions. A negative result may occur with  improper specimen collection/handling, submission of specimen other than nasopharyngeal swab, presence of viral mutation(s) within the areas targeted by this assay, and inadequate number of viral copies(<138 copies/mL). A negative result must be combined with clinical observations, patient history, and epidemiological information. The expected result is Negative.  Fact Sheet for Patients:  BloggerCourse.com  Fact Sheet for Healthcare Providers:  SeriousBroker.it  This test is no t yet approved or cleared by the [place]  FDA and  has been authorized for detection and/or diagnosis of SARS-CoV-2 by FDA under an Emergency Use Authorization (EUA). This EUA will remain  in effect (meaning this test can be used) for the duration of the COVID-19 declaration under Section 564(b)(1) of the Act, 21 U.S.C.section 360bbb-3(b)(1), unless the authorization is terminated  or revoked sooner.       Influenza A by PCR NEGATIVE NEGATIVE Final   Influenza B by PCR NEGATIVE NEGATIVE Final    Comment: (NOTE) The Xpert Xpress SARS-CoV-2/FLU/RSV plus assay is intended as an aid in the diagnosis of influenza from Nasopharyngeal swab specimens and should not be used as a sole basis for treatment. Nasal washings and aspirates are unacceptable for Xpert Xpress  SARS-CoV-2/FLU/RSV testing.  Fact Sheet for Patients: BloggerCourse.com  Fact Sheet for Healthcare Providers: SeriousBroker.it  This test is not yet approved or cleared by the [place]  FDA and has been authorized for detection and/or diagnosis of SARS-CoV-2 by FDA under an Emergency Use Authorization (EUA). This EUA will remain in effect (meaning this test can be used) for the duration of the COVID-19 declaration under Section 564(b)(1) of the Act, 21 U.S.C. section 360bbb-3(b)(1), unless the authorization is terminated or revoked.     Resp Syncytial Virus by PCR NEGATIVE NEGATIVE Final    Comment: (NOTE) Fact Sheet for Patients: BloggerCourse.com  Fact Sheet for Healthcare Providers: SeriousBroker.it  This test is not yet approved or  cleared by the [place]  FDA and has been authorized for detection and/or diagnosis of SARS-CoV-2 by FDA under an Emergency Use Authorization (EUA). This EUA will remain in effect (meaning this test can be used) for the duration of the COVID-19 declaration under Section 564(b)(1) of the Act, 21 U.S.C. section 360bbb-3(b)(1), unless the authorization is terminated or revoked.  Performed at [facility], [address] [place], [place] [address]   Respiratory (~20 pathogens) panel by PCR     Status: None   Collection Time: 09/26/23  8:30 AM   Specimen: Nasopharyngeal Swab; Respiratory  Result Value Ref Range Status   Adenovirus NOT DETECTED NOT DETECTED Final   Coronavirus 229E NOT DETECTED NOT DETECTED Final    Comment: (NOTE) The Coronavirus on the Respiratory Panel, DOES NOT test for the novel  Coronavirus (2019 nCoV)    Coronavirus HKU1 NOT DETECTED NOT DETECTED Final   Coronavirus NL63 NOT DETECTED NOT DETECTED Final   Coronavirus OC43 NOT DETECTED NOT DETECTED Final   Metapneumovirus NOT DETECTED NOT DETECTED Final   Rhinovirus  / Enterovirus NOT DETECTED NOT DETECTED Final   Influenza A NOT DETECTED NOT DETECTED Final   Influenza B NOT DETECTED NOT DETECTED Final   Parainfluenza Virus 1 NOT DETECTED NOT DETECTED Final   Parainfluenza Virus 2 NOT DETECTED NOT DETECTED Final   Parainfluenza Virus 3 NOT DETECTED NOT DETECTED Final   Parainfluenza Virus 4 NOT DETECTED NOT DETECTED Final   Respiratory Syncytial Virus NOT DETECTED NOT DETECTED Final   Bordetella pertussis NOT DETECTED NOT DETECTED Final   Bordetella Parapertussis NOT DETECTED NOT DETECTED Final   Chlamydophila pneumoniae NOT DETECTED NOT DETECTED Final   Mycoplasma pneumoniae NOT DETECTED NOT DETECTED Final    Comment: Performed at [facility], [address] [place], [place] [address]  MRSA Next Gen by PCR, Nasal     Status: None   Collection Time: 09/27/23 11:59 AM   Specimen: Nasal Mucosa; Nasal Swab  Result Value Ref Range Status   MRSA by PCR Next Gen NOT DETECTED NOT DETECTED Final    Comment: (NOTE) The GeneXpert MRSA Assay (FDA approved for NASAL specimens only), is one component of a comprehensive MRSA colonization surveillance program. It is not intended to diagnose MRSA infection nor to guide or monitor treatment for MRSA infections. Test performance is not FDA approved in patients less than 42 years old. Performed at [facility], [address], [place], [place] [address]      Scheduled Meds:  Chlorhexidine  Gluconate Cloth  6 each Topical Q0600   diltiazem   60 mg Oral Q6H   hydrocortisone  sod succinate (SOLU-CORTEF ) inj  50 mg Intravenous Daily   lactulose   30 g Oral TID   levothyroxine   25 mcg Intravenous Daily   metoprolol  tartrate  5 mg Intravenous Q6H   Continuous Infusions:  diltiazem  (CARDIZEM ) infusion 15 mg/hr (09/29/23 1100)    Procedures/Studies: DG CHEST PORT 1 VIEW Result Date: 09/29/2023 CLINICAL DATA:  In the sarcoid. EXAM: PORTABLE CHEST 1 VIEW COMPARISON:  09/25/2023 FINDINGS: Low volume film. The  cardio pericardial silhouette is enlarged. Vascular congestion with diffuse interstitial opacity suggests edema. There is patchy airspace disease in the right suprahilar region and left base. No substantial pleural effusion. Telemetry leads overlie the chest. IMPRESSION: 1. Low volume film with vascular congestion and diffuse interstitial opacity suggesting edema. 2. Patchy airspace disease in the right suprahilar region and left base. Findings may reflect asymmetric edema or infection. Electronically Signed  By: Camellia Candle M.D.   On: 09/29/2023 07:08   CT HEAD WO CONTRAST ( ) Result Date: 09/26/2023 CLINICAL DATA:  Mental status change, unknown cause. EXAM: CT HEAD WITHOUT CONTRAST TECHNIQUE: Contiguous axial images were obtained from the base of the skull through the vertex without intravenous contrast. RADIATION DOSE REDUCTION: This exam was performed according to the departmental dose-optimization program which includes automated exposure control, adjustment of the mA and/or kV according to patient size and/or use of iterative reconstruction technique. COMPARISON:  Head CT 04/27/2003 FINDINGS: The study is mildly motion degraded. Brain: There is no evidence of an acute infarct, intracranial hemorrhage, mass, midline shift, or extra-axial fluid collection. Cerebral volume is within normal limits for age. The ventricles are normal in size. Vascular: Calcified atherosclerosis at the skull base. No hyperdense vessel. Skull: No fracture or suspicious lesion. Sinuses/Orbits: Subtotal opacification of the left sphenoid sinus. Mild mucosal thickening in the left maxillary sinus. Small right mastoid effusion. Unremarkable orbits. Other: None. IMPRESSION: No evidence of acute intracranial abnormality. Electronically Signed   By: Dasie Hamburg M.D.   On: 09/26/2023 08:58   DG Chest 1 View Result Date: 09/25/2023 CLINICAL DATA:  Shortness of breath. EXAM: CHEST  1 VIEW COMPARISON:  February 20, 2022. FINDINGS:  The heart size and mediastinal contours are within normal limits. Hypoinflation of the lungs is noted. Both lungs are clear. The visualized skeletal structures are unremarkable. IMPRESSION: No active disease. Electronically Signed   By: Lynwood Landy Raddle M.D.   On: 09/25/2023 16:09    Alm Schneider, DO  [facility]  If 7PM-7AM, please contact night-coverage [email or web address] Password [facility] 09/29/2023, 12:49 PM   LOS: 3 days

## 2023-09-30 DIAGNOSIS — K7682 Hepatic encephalopathy: Secondary | ICD-10-CM | POA: Diagnosis not present

## 2023-09-30 DIAGNOSIS — B9561 Methicillin susceptible Staphylococcus aureus infection as the cause of diseases classified elsewhere: Secondary | ICD-10-CM

## 2023-09-30 DIAGNOSIS — F1090 Alcohol use, unspecified, uncomplicated: Secondary | ICD-10-CM

## 2023-09-30 DIAGNOSIS — I272 Pulmonary hypertension, unspecified: Secondary | ICD-10-CM

## 2023-09-30 DIAGNOSIS — I81 Portal vein thrombosis: Secondary | ICD-10-CM

## 2023-09-30 DIAGNOSIS — R7881 Bacteremia: Secondary | ICD-10-CM

## 2023-09-30 DIAGNOSIS — I8500 Esophageal varices without bleeding: Secondary | ICD-10-CM

## 2023-09-30 DIAGNOSIS — K55069 Acute infarction of intestine, part and extent unspecified: Secondary | ICD-10-CM

## 2023-09-30 DIAGNOSIS — I480 Paroxysmal atrial fibrillation: Secondary | ICD-10-CM | POA: Diagnosis not present

## 2023-09-30 DIAGNOSIS — G9341 Metabolic encephalopathy: Secondary | ICD-10-CM | POA: Diagnosis not present

## 2023-09-30 DIAGNOSIS — R601 Generalized edema: Secondary | ICD-10-CM

## 2023-09-30 DIAGNOSIS — D735 Infarction of spleen: Secondary | ICD-10-CM

## 2023-09-30 DIAGNOSIS — R600 Localized edema: Secondary | ICD-10-CM

## 2023-09-30 LAB — COMPREHENSIVE METABOLIC PANEL WITH GFR
ALT: 23 U/L (ref 0–44)
AST: 47 U/L — ABNORMAL HIGH (ref 15–41)
Albumin: 2.5 g/dL — ABNORMAL LOW (ref 3.5–5.0)
Alkaline Phosphatase: 82 U/L (ref 38–126)
Anion gap: 12 (ref 5–15)
BUN: 13 mg/dL (ref 6–20)
CO2: 22 mmol/L (ref 22–32)
Calcium: 8.3 mg/dL — ABNORMAL LOW (ref 8.9–10.3)
Chloride: 105 mmol/L (ref 98–111)
Creatinine, Ser: 1 mg/dL (ref 0.61–1.24)
GFR, Estimated: >60 mL/min (ref >60)
Glucose, Bld: 130 mg/dL — ABNORMAL HIGH (ref 70–99)
Potassium: 2.5 mmol/L — CL (ref 3.5–5.1)
Sodium: 139 mmol/L (ref 135–145)
Total Bilirubin: 3.1 mg/dL — ABNORMAL HIGH (ref 0.0–1.2)
Total Protein: 6.8 g/dL (ref 6.5–8.1)

## 2023-09-30 LAB — BLOOD CULTURE ID PANEL (REFLEXED) - BCID2

## 2023-09-30 LAB — CBC
HCT: 31.5 % — ABNORMAL LOW (ref 39.0–52.0)
Hemoglobin: 10.6 g/dL — ABNORMAL LOW (ref 13.0–17.0)
MCH: 31 pg (ref 26.0–34.0)
MCHC: 33.7 g/dL (ref 30.0–36.0)
MCV: 92.1 fL (ref 80.0–100.0)
Platelets: 54 K/uL — ABNORMAL LOW (ref 150–400)
RBC: 3.42 MIL/uL — ABNORMAL LOW (ref 4.22–5.81)
RDW: 15.3 % (ref 11.5–15.5)
WBC: 6.2 K/uL (ref 4.0–10.5)
nRBC: 0 % (ref 0.0–0.2)

## 2023-09-30 LAB — MAGNESIUM: Magnesium: 2.1 mg/dL (ref 1.7–2.4)

## 2023-09-30 MED ORDER — THIAMINE MONONITRATE 100 MG PO TABS
100.0000 mg | ORAL_TABLET | Freq: Every day | ORAL | Status: DC
  Administered 2023-09-30 – 2023-10-05 (×6): 100 mg via ORAL
  Filled 2023-09-30 (×6): qty 1

## 2023-09-30 MED ORDER — VANCOMYCIN HCL 1500 MG/300ML IV SOLN: 1500.0000 mg | Freq: Two times a day (BID) | INTRAVENOUS | Status: DC

## 2023-09-30 MED ORDER — POTASSIUM CHLORIDE 10 MEQ/100ML IV SOLN
10.0000 meq | INTRAVENOUS | Status: AC
Start: 2023-09-30 — End: 2023-09-30
  Administered 2023-09-30 (×2): 10 meq via INTRAVENOUS
  Filled 2023-09-30: qty 100

## 2023-09-30 MED ORDER — POTASSIUM CHLORIDE 20 MEQ PO PACK
40.0000 meq | PACK | ORAL | Status: AC
  Administered 2023-09-30 (×2): 40 meq via ORAL
  Filled 2023-09-30 (×2): qty 2

## 2023-09-30 MED ORDER — VANCOMYCIN HCL 2000 MG/400ML IV SOLN
2000.0000 mg | Freq: Once | INTRAVENOUS | Status: DC
  Filled 2023-09-30: qty 400

## 2023-09-30 MED ORDER — CEFAZOLIN SODIUM-DEXTROSE 2-4 GM/100ML-% IV SOLN
2.0000 g | Freq: Three times a day (TID) | INTRAVENOUS | Status: DC
  Administered 2023-09-30 – 2023-10-05 (×16): 2 g via INTRAVENOUS
  Filled 2023-09-30 (×16): qty 100

## 2023-09-30 MED ORDER — LEVOTHYROXINE SODIUM 100 MCG/5ML IV SOLN
50.0000 ug | Freq: Every day | INTRAVENOUS | Status: AC
  Administered 2023-10-01 – 2023-10-02 (×2): 50 ug via INTRAVENOUS
  Filled 2023-09-30 (×2): qty 5

## 2023-09-30 NOTE — Progress Notes (Signed)
 OT Cancellation Note  Patient Details Name: [name] MRN: [id number] DOB: [birth year]   Cancelled Treatment:    Reason Eval/Treat Not Completed: Other (comment). Pt had been moved to higher of level of care on Friday and since returned to the main floor. Pt will need a new order to resume therapy given the brief stay in ICU. Thank you.   Deanie Jupiter OT, MOT   Jayson Person 09/30/2023, 7:38 AM

## 2023-09-30 NOTE — Plan of Care (Signed)

## 2023-09-30 NOTE — Consult Note (Addendum)
 NAME: [name]  DOB: 1965-02-18  MRN: [id number]  Date/Time: 09/30/2023 8:08 PM  REQUESTING PROVIDER: Angelyn alert Subjective:  REASON FOR CONSULT: MSSA bacteremia ?pt is a poor historian due to his medical condition, chart reviewed, spoke to his nurse This is a TELE VISIT  [name] is a 58 y.o. with a history of  OSA, CHF, HFpEF, paroxysmal afib, alcoholic cirrhosis, Portal HTN with esophageal varices, hepatic encephalopathy, morbid obesity  presents from [facility] for confusion, low K and increasing NH3 on 09/25/23 Vitals in the ED  09/25/23 14:17  BP 114/59 !  Temp 98.3 F (36.8 C)  Pulse Rate 79  Resp 16  SpO2 98 %  Weight 380 lb 1.2 oz (H)     Latest Reference Range & Units 09/25/23 14:20  WBC 4.0 - 10.5 K/uL 9.0  Hemoglobin 13.0 - 17.0 g/dL 88.4 (L)  HCT 60.9 - 47.9 % 34.5 (L)  Platelets 150 - 400 K/uL 83 (L) [1]  Creatinine 0.61 - 1.24 mg/dL 9.18  CT head N.  Treated for hepatic encephalopathy with lactulose  Blood culture not sent till 09/29/23. Culture positive for MSSA and I am seeing this patient  HE was recently in [facility] for 9 days 9/3-9/12  for confusion and treated as hepatic encephalopathy with lactulose . His Nh3 decreased to 29 from >100 on admission. During that hospitalization he was volume overloaded with edema legs, scrotum and anasarca. CT abdomen/pelvis showed large caliber splenic and portal veins with near occlusive thrombus of portal, mesenteric and splenic veins , minimal ascites. HE underwent aggressive diuresis with torsemide  and albumin  and lost 12 kgs of weight. HE was also on metoprolol  and diltiazem  for afib There was concern for rt lower extremity celluliits and treated with piptazo. Wbc was 14.5 on admission. HE had low platelet 28-47 As per [facility] records no central line was placed   Past Medical History:  Diagnosis Date   Anxiety    Atrial fibrillation (HCC)    Blood clot in vein    blood clot in portal vein    CHF (congestive heart failure) (HCC)    Cirrhosis (HCC)    NASH   Constipation    COPD (chronic obstructive pulmonary disease) (HCC)    Dysrhythmia    GERD (gastroesophageal reflux disease)    Gout    Leukopenia 07/08/2015   Neuromuscular disorder (HCC)    neuropathy in hands and feet   Psoriasis    RA (rheumatoid arthritis) (HCC)    Sleep apnea    cpap used- level 10 and greater   Thrombocytopenia     Past Surgical History:  Procedure Laterality Date   ANKLE SURGERY     right ankle talor repair   CHOLECYSTECTOMY  sept 2016   CHOLECYSTECTOMY     COLONOSCOPY  05/08/2011   Procedure: COLONOSCOPY;  Surgeon: Claudis RAYMOND Rivet, MD;  [facility];  Service: Endoscopy;  Laterality: N/A;  730   COLONOSCOPY WITH PROPOFOL  N/A 05/27/2020   Procedure: COLONOSCOPY WITH PROPOFOL ;  Surgeon: Eartha Angelia Sieving, MD;  [facility];  Service: Gastroenterology;  Laterality: N/A;  Patient needs a unit of platelets prior to procedure.   ESOPHAGEAL BANDING N/A 05/27/2020   Procedure: ESOPHAGEAL BANDING;  Surgeon: Eartha Angelia Sieving, MD;  [facility];  Service: Gastroenterology;  Laterality: N/A;   ESOPHAGOGASTRODUODENOSCOPY  02/28/2011   Procedure: ESOPHAGOGASTRODUODENOSCOPY (EGD);  Surgeon: Claudis RAYMOND Rivet, MD;  [facility];  Service: Endoscopy;  Laterality:  N/A;  1200   ESOPHAGOGASTRODUODENOSCOPY N/A 06/11/2012   Procedure: ESOPHAGOGASTRODUODENOSCOPY (EGD);  Surgeon: Claudis RAYMOND Rivet, MD;  [facility];  Service: Endoscopy;  Laterality: N/A;  1200  FYI patient is 400 pounds   ESOPHAGOGASTRODUODENOSCOPY (EGD) WITH PROPOFOL  N/A 04/21/2014   Procedure: ESOPHAGOGASTRODUODENOSCOPY (EGD) WITH PROPOFOL ;  Surgeon: Claudis RAYMOND Rivet, MD;  Location: [facility];  Service: Endoscopy;  Laterality: N/A;   ESOPHAGOGASTRODUODENOSCOPY (EGD) WITH PROPOFOL  N/A 05/27/2020   Procedure: ESOPHAGOGASTRODUODENOSCOPY (EGD) WITH PROPOFOL ;  Surgeon: Eartha Angelia Sieving,  MD;  [facility];  Service: Gastroenterology;  Laterality: N/A;  7:30 am   ESOPHAGOGASTRODUODENOSCOPY (EGD) WITH PROPOFOL  N/A 07/29/2020   Procedure: ESOPHAGOGASTRODUODENOSCOPY (EGD) WITH PROPOFOL ;  Surgeon: Eartha Angelia Sieving, MD;  [facility];  Service: Gastroenterology;  Laterality: N/A;  8:20   ESOPHAGOGASTRODUODENOSCOPY (EGD) WITH PROPOFOL  N/A 07/31/2022   Procedure: ESOPHAGOGASTRODUODENOSCOPY (EGD) WITH PROPOFOL ;  Surgeon: Eartha Angelia Sieving, MD;  [facility];  Service: Gastroenterology;  Laterality: N/A;  9:30am;asa 3   HERNIA REPAIR Right    as child; inguinal   HERNIA REPAIR  sept 2016   LIVER BIOPSY  2012   SCIATIC NERVE EXPLORATION     TONSILLECTOMY      Social History   Socioeconomic History   Marital status: Divorced    Spouse name: Not on file   Number of children: 1   Years of education: Not on file   Highest education level: Some college, no degree  Occupational History   Occupation: Disabled  Tobacco Use   Smoking status: Never   Smokeless tobacco: Current    Types: Snuff, Chew   Tobacco comments:    Pt reports that he dips  Vaping Use   Vaping status: Never Used  Substance and Sexual Activity   Alcohol use: Yes    Alcohol/week: 4.0 standard drinks of alcohol    Types: 4 Shots of liquor per week    Comment: occasionally   Drug use: No    Comment: Use to smoke cocaine and marijuana. No IV drug use   Sexual activity: Yes  Other Topics Concern   Not on file  Social History Narrative   Not on file   Social Drivers of Health   Financial Resource Strain: Low Risk  (07/22/2023)   Received from [facility]   Overall Financial Resource Strain (CARDIA)    How hard is it for you to pay for the very basics like food, housing, medical care, and heating?: Not very hard  Food Insecurity: Patient Unable To Answer (09/25/2023)   Hunger Vital Sign    Worried About Running Out of Food in the Last Year: Patient unable  to answer    Ran Out of Food in the Last Year: Patient unable to answer  Transportation Needs: Patient Unable To Answer (09/25/2023)   PRAPARE - Transportation    Lack of Transportation (Medical): Patient unable to answer    Lack of Transportation (Non-Medical): Patient unable to answer  Physical Activity: Inactive (07/22/2023)   Received from [facility]   Exercise Vital Sign    On average, how many days per week do you engage in moderate to strenuous exercise (like a brisk walk)?: 0 days    On average, how many minutes do you engage in exercise at this level?: 0 min  Stress: No Stress Concern Present (08/18/2023)   Received from [facility] - Occupational Stress Questionnaire    Do  you feel stress - tense, restless, nervous, or anxious, or unable to sleep at night because your mind is troubled all the time - these days?: Not at all  Social Connections: Moderately Isolated (07/22/2023)   Received from [facility]   Social Connection and Isolation Panel    In a typical week, how many times do you talk on the phone with family, friends, or neighbors?: More than three times a week    How often do you get together with friends or relatives?: More than three times a week    How often do you attend church or religious services?: 1 to 4 times per year    Do you belong to any clubs or organizations such as church groups, unions, fraternal or athletic groups, or school groups?: No    How often do you attend meetings of the clubs or organizations you belong to?: Never    Are you married, widowed, divorced, separated, never married, or living with a partner?: Divorced  Intimate Partner Violence: Patient Unable To Answer (09/25/2023)   Humiliation, Afraid, Rape, and Kick questionnaire    Fear of Current or Ex-Partner: Patient unable to answer    Emotionally Abused: Patient unable to answer    Physically Abused: Patient unable to answer    Sexually  Abused: Patient unable to answer    Family History  Problem Relation Age of Onset   Colon cancer Mother    Cancer Mother        intestine   Cancer Father        throat   Cancer Brother        brain   Cancer Sister    Heart attack Neg Hx    Stroke Neg Hx    Allergies  Allergen Reactions   Allopurinol  Other (See Comments)    Joint pain worse Caused worsening gout   Fluoxetine Hcl Itching   I? Current Facility-Administered Medications  Medication Dose Route Frequency Provider Last Rate Last Admin   acetaminophen  (TYLENOL ) tablet 650 mg  650 mg Oral Q6H PRN Tat, David, MD   650 mg at 09/29/23 1420   Or   acetaminophen  (TYLENOL ) suppository 650 mg  650 mg Rectal Q6H PRN Tat, Alm, MD       ceFAZolin  (ANCEF ) IVPB 2g/100 mL premix  2 g Intravenous Q8H Tat, David, MD 200 mL/hr at 09/30/23 1534 2 g at 09/30/23 1534   Chlorhexidine  Gluconate Cloth 2 % PADS 6 each  6 each Topical Q0600 Tat, Alm, MD   6 each at 09/29/23 9372   diltiazem  (CARDIZEM ) 125 mg in dextrose  5% 125 mL (1 mg/mL) infusion  5-15 mg/hr Intravenous Continuous Tat, Alm, MD   Stopped at 09/29/23 1304   diltiazem  (CARDIZEM ) tablet 60 mg  60 mg Oral Q6H TatAlm, MD   60 mg at 09/30/23 1827   hydrocortisone  sodium succinate  (SOLU-CORTEF ) 100 MG injection 50 mg  50 mg Intravenous Daily Tat, Alm, MD   50 mg at 09/30/23 9043   lactulose  (CHRONULAC ) 10 GM/15ML solution 30 g  30 g Oral TID Evonnie Alm, MD   30 g at 09/30/23 1529   levalbuterol  (XOPENEX ) nebulizer solution 0.63 mg  0.63 mg Nebulization TID Tat, Alm, MD   0.63 mg at 09/30/23 1226   [START ON 10/01/2023] levothyroxine  (SYNTHROID , LEVOTHROID) injection 50 mcg  50 mcg Intravenous Daily Tat, David, MD       LORazepam  (ATIVAN ) injection 0.5 mg  0.5 mg Intravenous Q6H PRN Tat, Alm,  MD   0.5 mg at 09/29/23 0147   metoprolol  tartrate (LOPRESSOR ) injection 5 mg  5 mg Intravenous Q6H Evonnie Lenis, MD   5 mg at 09/30/23 1826   ondansetron  (ZOFRAN ) tablet 4 mg  4 mg  Oral Q6H PRN Tat, Lenis, MD       Or   ondansetron  (ZOFRAN ) injection 4 mg  4 mg Intravenous Q6H PRN Tat, Lenis, MD       polyethylene glycol (MIRALAX  / GLYCOLAX ) packet 17 g  17 g Oral Daily PRN Tat, Lenis, MD       thiamine  (VITAMIN B1) tablet 100 mg  100 mg Oral Daily Tat, David, MD   100 mg at 09/30/23 1528     Abtx:  Anti-infectives (From admission, onward)    Start     Dose/Rate Route Frequency Ordered Stop   10/01/23 0245  vancomycin  (VANCOREADY) IVPB 1500 mg/300 mL  Status:  Discontinued       Placed in Followed by Linked Group   1,500 mg 150 mL/hr over 120 Minutes Intravenous Every 12 hours 09/30/23 1349 09/30/23 1448   09/30/23 1530  ceFAZolin  (ANCEF ) IVPB 2g/100 mL premix        2 g 200 mL/hr over 30 Minutes Intravenous Every 8 hours 09/30/23 1448     09/30/23 1445  vancomycin  (VANCOREADY) IVPB 2000 mg/400 mL  Status:  Discontinued       Placed in Followed by Linked Group   2,000 mg 200 mL/hr over 120 Minutes Intravenous  Once 09/30/23 1349 09/30/23 1448       REVIEW OF SYSTEMS: not reliable due to  confusion Denies fever, headache, cough, sob, pain   Objective:  VITALS:  BP 108/87 (BP Location: Left Arm)   Pulse 78   Temp 98.1 F (36.7 C) (Axillary)   Resp (!) 22   Ht 6' (1.829 m)   Wt (!) 159.9 kg   SpO2 96%   BMI 47.81 kg/m   PHYSICAL EXAM:  General: Awake, but confused, trying to drink out of paper, knows the year but not the place or month Answers simple questions  Head: Normocephalic, without obvious abnormality, atraumatic. Eyes/skin icterus ENT cannot examine Lips, mucosa, and tongue normal. No Thrush Neck:  symmetrical, no adenopathy, thyroid : non tender no carotid bruit and no JVD. Lungs: did not examine Heart: did not examine Abdomen: distended- para umbilical hernia- prominent veins on the abdomen  Scrotal edema Extremities: rt arm bruising B/l leg edema rt >>> left Scaling rt leg         Skin: as above Lymph: Cervical,  supraclavicular normal. Neurologic: Grossly non-focal Pertinent Labs Lab Results CBC    Component Value Date/Time   WBC 6.2 09/30/2023 0715   RBC 3.42 (L) 09/30/2023 0715   HGB 10.6 (L) 09/30/2023 0715   HGB 11.4 (L) 02/22/2020 1138   HCT 31.5 (L) 09/30/2023 0715   HCT 33.9 (L) 02/22/2020 1138   PLT 54 (L) 09/30/2023 0715   PLT 38 (LL) 02/22/2020 1138   MCV 92.1 09/30/2023 0715   MCV 79 02/22/2020 1138   MCH 31.0 09/30/2023 0715   MCHC 33.7 09/30/2023 0715   RDW 15.3 09/30/2023 0715   RDW 14.0 02/22/2020 1138   LYMPHSABS 0.8 09/27/2023 0453   LYMPHSABS 0.4 (L) 02/22/2020 1138   MONOABS 0.4 09/27/2023 0453   EOSABS 0.1 09/27/2023 0453   EOSABS 0.1 02/22/2020 1138   BASOSABS 0.1 09/27/2023 0453   BASOSABS 0.0 02/22/2020 1138       Latest  Ref Rng & Units 09/30/2023    7:15 AM 09/29/2023    5:21 AM 09/28/2023    4:09 AM  CMP  Glucose 70 - 99 mg/dL 869  848  893   BUN 6 - 20 mg/dL 13  12  9    Creatinine 0.61 - 1.24 mg/dL 8.99  8.91  9.15   Sodium 135 - 145 mmol/L 139  136  136   Potassium 3.5 - 5.1 mmol/L 2.5  2.9  3.5   Chloride 98 - 111 mmol/L 105  103  101   CO2 22 - 32 mmol/L 22  24  25    Calcium 8.9 - 10.3 mg/dL 8.3  8.2  8.1   Total Protein 6.5 - 8.1 g/dL 6.8  7.0  7.0   Total Bilirubin 0.0 - 1.2 mg/dL 3.1  3.9  4.1   Alkaline Phos 38 - 126 U/L 82  80  90   AST 15 - 41 U/L 47  47  55   ALT 0 - 44 U/L 23  23  23        Microbiology: Recent Results (from the past 240 hours)  Resp panel by RT-PCR (RSV, Flu A&B, Covid) Anterior Nasal Swab     Status: None   Collection Time: 09/26/23  8:30 AM   Specimen: Anterior Nasal Swab  Result Value Ref Range Status   SARS Coronavirus 2 by RT PCR NEGATIVE NEGATIVE Final    Comment: (NOTE) SARS-CoV-2 target nucleic acids are NOT DETECTED.  The SARS-CoV-2 RNA is generally detectable in upper respiratory specimens during the acute phase of infection. The lowest concentration of SARS-CoV-2 viral copies this assay can detect  is 138 copies/mL. A negative result does not preclude SARS-Cov-2 infection and should not be used as the sole basis for treatment or other patient management decisions. A negative result may occur with  improper specimen collection/handling, submission of specimen other than nasopharyngeal swab, presence of viral mutation(s) within the areas targeted by this assay, and inadequate number of viral copies(<138 copies/mL). A negative result must be combined with clinical observations, patient history, and epidemiological information. The expected result is Negative.  Fact Sheet for Patients:  BloggerCourse.com  Fact Sheet for Healthcare Providers:  SeriousBroker.it  This test is no t yet approved or cleared by the [place]  FDA and  has been authorized for detection and/or diagnosis of SARS-CoV-2 by FDA under an Emergency Use Authorization (EUA). This EUA will remain  in effect (meaning this test can be used) for the duration of the COVID-19 declaration under Section 564(b)(1) of the Act, 21 U.S.C.section 360bbb-3(b)(1), unless the authorization is terminated  or revoked sooner.       Influenza A by PCR NEGATIVE NEGATIVE Final   Influenza B by PCR NEGATIVE NEGATIVE Final    Comment: (NOTE) The Xpert Xpress SARS-CoV-2/FLU/RSV plus assay is intended as an aid in the diagnosis of influenza from Nasopharyngeal swab specimens and should not be used as a sole basis for treatment. Nasal washings and aspirates are unacceptable for Xpert Xpress SARS-CoV-2/FLU/RSV testing.  Fact Sheet for Patients: BloggerCourse.com  Fact Sheet for Healthcare Providers: SeriousBroker.it  This test is not yet approved or cleared by the [place]  FDA and has been authorized for detection and/or diagnosis of SARS-CoV-2 by FDA under an Emergency Use Authorization (EUA). This EUA will remain in effect  (meaning this test can be used) for the duration of the COVID-19 declaration under Section 564(b)(1) of the Act, 21 U.S.C. section 360bbb-3(b)(1), unless the  authorization is terminated or revoked.     Resp Syncytial Virus by PCR NEGATIVE NEGATIVE Final    Comment: (NOTE) Fact Sheet for Patients: BloggerCourse.com  Fact Sheet for Healthcare Providers: SeriousBroker.it  This test is not yet approved or cleared by the [place]  FDA and has been authorized for detection and/or diagnosis of SARS-CoV-2 by FDA under an Emergency Use Authorization (EUA). This EUA will remain in effect (meaning this test can be used) for the duration of the COVID-19 declaration under Section 564(b)(1) of the Act, 21 U.S.C. section 360bbb-3(b)(1), unless the authorization is terminated or revoked.  Performed at [facility], [address] [place], [place] [address]   Respiratory (~20 pathogens) panel by PCR     Status: None   Collection Time: 09/26/23  8:30 AM   Specimen: Nasopharyngeal Swab; Respiratory  Result Value Ref Range Status   Adenovirus NOT DETECTED NOT DETECTED Final   Coronavirus 229E NOT DETECTED NOT DETECTED Final    Comment: (NOTE) The Coronavirus on the Respiratory Panel, DOES NOT test for the novel  Coronavirus (2019 nCoV)    Coronavirus HKU1 NOT DETECTED NOT DETECTED Final   Coronavirus NL63 NOT DETECTED NOT DETECTED Final   Coronavirus OC43 NOT DETECTED NOT DETECTED Final   Metapneumovirus NOT DETECTED NOT DETECTED Final   Rhinovirus / Enterovirus NOT DETECTED NOT DETECTED Final   Influenza A NOT DETECTED NOT DETECTED Final   Influenza B NOT DETECTED NOT DETECTED Final   Parainfluenza Virus 1 NOT DETECTED NOT DETECTED Final   Parainfluenza Virus 2 NOT DETECTED NOT DETECTED Final   Parainfluenza Virus 3 NOT DETECTED NOT DETECTED Final   Parainfluenza Virus 4 NOT DETECTED NOT DETECTED Final   Respiratory Syncytial Virus NOT  DETECTED NOT DETECTED Final   Bordetella pertussis NOT DETECTED NOT DETECTED Final   Bordetella Parapertussis NOT DETECTED NOT DETECTED Final   Chlamydophila pneumoniae NOT DETECTED NOT DETECTED Final   Mycoplasma pneumoniae NOT DETECTED NOT DETECTED Final    Comment: Performed at [facility], [address] [place], [place] [address]  MRSA Next Gen by PCR, Nasal     Status: None   Collection Time: 09/27/23 11:59 AM   Specimen: Nasal Mucosa; Nasal Swab  Result Value Ref Range Status   MRSA by PCR Next Gen NOT DETECTED NOT DETECTED Final    Comment: (NOTE) The GeneXpert MRSA Assay (FDA approved for NASAL specimens only), is one component of a comprehensive MRSA colonization surveillance program. It is not intended to diagnose MRSA infection nor to guide or monitor treatment for MRSA infections. Test performance is not FDA approved in patients less than 80 years old. Performed at [facility], [address] [place], [place] [address]   Culture, blood (Routine X 2) w Reflex to ID Panel     Status: None (Preliminary result)   Collection Time: 09/29/23 12:35 PM   Specimen: BLOOD LEFT HAND  Result Value Ref Range Status   Specimen Description   Final    BLOOD LEFT HAND BOTTLES DRAWN AEROBIC AND ANAEROBIC   Special Requests Blood Culture adequate volume  Final   Culture  Setup Time   Final    IN BOTH AEROBIC AND ANAEROBIC BOTTLES GRAM POSITIVE COCCI Gram Stain Report Called to,Read Back By and Verified With: A RAY RN9/22/25 @1006  BY J. WHITE  GRAM STAIN REVIEWED-AGREE WITH RESULT DRT Organism ID to follow Performed at [facility], [address], [place], [place] [address]    Culture GRAM POSITIVE COCCI  Final   Report Status PENDING  Incomplete  Blood Culture ID Panel (Reflexed)     Status: Abnormal   Collection Time: 09/29/23 12:35 PM  Result Value Ref Range Status   Enterococcus faecalis NOT DETECTED NOT DETECTED Final   Enterococcus Faecium NOT DETECTED NOT DETECTED  Final   Listeria monocytogenes NOT DETECTED NOT DETECTED Final   Staphylococcus species DETECTED (A) NOT DETECTED Final    Comment: CRITICAL RESULT CALLED TO, READ BACK BY AND VERIFIED WITH: PHARMD LORI POOLE ON 09/30/23 @ 1430 BY DRT    Staphylococcus aureus (BCID) DETECTED (A) NOT DETECTED Final    Comment: CRITICAL RESULT CALLED TO, READ BACK BY AND VERIFIED WITH: PHARMD LORI POOLE ON 09/30/23 @ 1430 BY DRT    Staphylococcus epidermidis NOT DETECTED NOT DETECTED Final   Staphylococcus lugdunensis NOT DETECTED NOT DETECTED Final   Streptococcus species NOT DETECTED NOT DETECTED Final   Streptococcus agalactiae NOT DETECTED NOT DETECTED Final   Streptococcus pneumoniae NOT DETECTED NOT DETECTED Final   Streptococcus pyogenes NOT DETECTED NOT DETECTED Final   A.calcoaceticus-baumannii NOT DETECTED NOT DETECTED Final   Bacteroides fragilis NOT DETECTED NOT DETECTED Final   Enterobacterales NOT DETECTED NOT DETECTED Final   Enterobacter cloacae complex NOT DETECTED NOT DETECTED Final   Escherichia coli NOT DETECTED NOT DETECTED Final   Klebsiella aerogenes NOT DETECTED NOT DETECTED Final   Klebsiella oxytoca NOT DETECTED NOT DETECTED Final   Klebsiella pneumoniae NOT DETECTED NOT DETECTED Final   Proteus species NOT DETECTED NOT DETECTED Final   Salmonella species NOT DETECTED NOT DETECTED Final   Serratia marcescens NOT DETECTED NOT DETECTED Final   Haemophilus influenzae NOT DETECTED NOT DETECTED Final   Neisseria meningitidis NOT DETECTED NOT DETECTED Final   Pseudomonas aeruginosa NOT DETECTED NOT DETECTED Final   Stenotrophomonas maltophilia NOT DETECTED NOT DETECTED Final   Candida albicans NOT DETECTED NOT DETECTED Final   Candida auris NOT DETECTED NOT DETECTED Final   Candida glabrata NOT DETECTED NOT DETECTED Final   Candida krusei NOT DETECTED NOT DETECTED Final   Candida parapsilosis NOT DETECTED NOT DETECTED Final   Candida tropicalis NOT DETECTED NOT DETECTED Final    Cryptococcus neoformans/gattii NOT DETECTED NOT DETECTED Final   Meth resistant mecA/C and MREJ NOT DETECTED NOT DETECTED Final    Comment: Performed at [facility], [address], [place], [place] [address]  Culture, blood (Routine X 2) w Reflex to ID Panel     Status: None (Preliminary result)   Collection Time: 09/29/23 12:50 PM   Specimen: BLOOD RIGHT HAND  Result Value Ref Range Status   Specimen Description   Final    BLOOD RIGHT HAND BOTTLES DRAWN AEROBIC AND ANAEROBIC Performed at [facility], [address], [place], [place] [address]    Special Requests   Final    Blood Culture adequate volume Performed at [facility], [address] [place], [place] [address]    Culture  Setup Time   Final    AEROBIC BOTTLE ONLY GRAM POSITIVE COCCI Gram Stain Report Called to,Read Back By and Verified With: A RAY RN 09/30/23 @1010  BY J. WHITE GRAM STAIN REVIEWED-AGREE WITH RESULT DRT Performed at [facility], [address] [place], [place] [address]    Culture GRAM POSITIVE COCCI  Final   Report Status PENDING  Incomplete    IMAGING RESULTS: Cardiomegaly with congestion  I have personally reviewed the films ? CT head no acute intracranial abnormality  EKG  Impression/Recommendation  Hepatic encephalopathy - pt was recently in [facility] for the same and went to SNF on 9/12 and admitted to [facility] on 9/17 with the same On lactulose   Alcoholic cirrhosis with decompensation with PHT, thrombocytopenia, esophogeal varices  Portal vein, splenic vein and mesenteric vein thrombosis  New MSSA bacteremia- from [facility] sent on 9/21 ( none sent on 9/17) Not sure whether it was present on admisison or hospital onset No central lines here or at [facility]  Pt is now on cefazolin  Need 2 d echo And TEE if necessary to r/o endocarditis As per chart review he has no cardiac device Could the source be the rt leg?  Edema legs/anasarca  Thrombocytopenia due to cirrhosis-  52  CHF - fluid overload  Paroxysmal afib on  diltiazem   OSA-   Morbid obesity?  Very High TSH-low thyroxine  myxedema /hypothyroidism on Iv thyroxine - managed as per hospitalist  Pt on hydrocortisone - baseline cortisol was okay at 11.8 ( 32min/60 min sub optimal response-)     This consult involved complex antimicrobial management? ? I have personally spent  --75-minutes involved in face-to-face and non-face-to-face activities for this patient on the day of the visit. Professional time spent includes the following activities: Preparing to see the patient (review of tests), Obtaining and/or reviewing separately obtained history (admission/discharge record), Performing a medically appropriate examination and/or evaluation , Ordering medications/tests/procedures, referring and communicating with other health care professionals, Documenting clinical information in the EMR, Independently interpreting results (not separately reported), Communicating results to the patient/r, Counseling and educating the patient/and Care coordination  ________________________________________________  Discussed with his nurse.

## 2023-09-30 NOTE — Progress Notes (Signed)
 MD made aware of patients agitation and attempts to get OOB. Safety sitter assigned at this time due to incident with patient attempting to get OOB.

## 2023-09-30 NOTE — Progress Notes (Signed)
 PROGRESS NOTE  [name] FMW:[id number] DOB: 1965/11/11 DOA: 09/25/2023 PCP: Carleen Dallas PARAS, FNP  Brief History:    58 year old male with a history of HFpEF, paroxysmal atrial fibrillation, alcoholic liver cirrhosis, portal hypertension with esophageal varices, thrombocytopenia, COPD, OSA, portal/splenic/mesenteric vein thrombosis, chronic back pain obesity presented from [facility] for abnormal labs.  Apparently, it was reported that the patient had an elevated ammonia in the 70s.  The patient was given a dose of lactulose .  Repeat ammonia was 63.  The patient remained confused.  The patient also had low potassium.  As result he was sent to emergency department for further evaluation and treatment.    Most recently, the patient was admitted to [facility] from 09/12/2023 to 09/20/2023.  He was treated for hepatic encephalopathy and anasarca/fluid overload.  He was initially noted to have an ammonia level >100 with a history of poor compliance with lactulose .  He was noted at that time that the patient had recent cessation of alcohol about 3 weeks prior to the admission. It was noted that the patient remained intermittently confused despite improving ammonia, but he was easily redirected.  Even on the day of discharge, the patient was pleasantly confused but easily redirected. During that hospitalization he was treated for suspected cellulitis of his right lower extremity with Zosyn.  He was transition to Augmentin.  He was discharged with torsemide  20 mg daily.  He was also noted to have another hospitalization recently at [facility] from 08/18/2023 to 08/30/2023.  The patient was transferred to [facility] from OSH secondary to volume overload and CT evidence showing portal, SMV, and splenic vein thrombosis.  He underwent EGD on 08/20/2023 which showed grade 1 esophageal varices in the esophagus without bleeding.  There were multiple hyperplastic and inflammatory polyps in the antrum.  There  is no gastric varices.  GI recommended against anticoagulation given the patient's pancytopenia, presence of varices, and poor compliance.   His hospitalization was complicated by acute metabolic/hepatic encephalopathy.  His home sedating medications including BuSpar , Subutex , baclofen , and Cymbalta  were held.  Ammonia level was elevated 80.  Subsequently, his baclofen  and Subutex  were resumed which he appeared to be tolerating.  His Cymbalta  was decreased to 60 mg at nighttime.  He was discharged with lactulose  20 g twice daily.  He also had AKI secondary to overdiuresis with serum creatinine peaking at 3.14.   Assessment/Plan: Acute metabolic encephalopathy/hepatic encephalopathy - multifactorial including hepatic encephalopathy, severe hypothyroidism, Wernicke's/Korsakoff enceph - remains encephalopathic, intermittently agitated - Repeat ammonia--38>>51>>45 - B12--2954 - TSH-45.347 - folate 15.0 - Urine drug screen--neg - UA--no pyuria - CT brain--neg - Continue lactulose --increase dose to 30 g TID - 09/26/2023 ABG 7.41/51/73/32 - Minimize hypnotic medications -finished high dose thiamine  x 6 doses - more interactive but remains encephalopathic  Bacteremia -9/21 blood culture>>GPC 2/2 -started vanc IV -repeat blood culture -Echo   Paroxysmal atrial fibrillation with RVR - continue Lopressor  IV while the patient is somnolent - 9/19 started diltiazem  drip>>transition to po 9/21 - Not a candidate for anticoagulation secondary to liver cirrhosis, thrombocytopenia, and poor compliance   Hypothyroidism/Myxedema -TSH 45.347, Free T4 0.61, Free T3 = 1.8 -start low dose synthroid  IV and titrate slowly up - increase synthroid  IV to 50 mcg daily   Relative adrenal insufficiency -cortrosyn  stimulation 11.8>>14.4>>15.6 -started solucortef   Chronic HFpEF - 08/20/2023 echo EF 55 to 60%, no WMA, normal RVF, difficult study secondary to patient's body habitus  History of alcohol  abuse/polysubstance abuse - Urine drug screen--neg   Alcoholic liver cirrhosis - Patient has history of esophageal varices and mesenteric vein thrombosis - now fluid overloaded>>give lasix  IV x 1 9/21   Hypokalemia - Replete - mag 2.1   Thrombocytopenia - Secondary to liver cirrhosis - Monitor for signs of bleeding   Morbid obesity - BMI 46.94 - Lifestyle modification   Goals of Care -consult palliative -no one currently in family has documented POA presently -social service consulted to obtain guardianship               Family Communication:  updated niece 9/22   Consultants:  none   Code Status:  FULL    DVT Prophylaxis:  SCDs     Procedures: As Listed in Progress Note Above   Antibiotics: Vanc 9/22>>              Subjective: Patient denies fevers, chills, headache, chest pain, dyspnea, nausea, vomiting, diarrhea, abdominal pain, dysuria,   Objective: Vitals:   09/30/23 0411 09/30/23 0640 09/30/23 0755 09/30/23 1226  BP: 120/75 113/75    Pulse: 78 78    Resp: (!) 22 (!) 22    Temp: 98.3 F (36.8 C) 98.3 F (36.8 C)    TempSrc:  Oral    SpO2: 100% 100% 96% 96%  Weight:      Height:        Intake/Output Summary (Last 24 hours) at 09/30/2023 1433 Last data filed at 09/30/2023 0600 Gross per 24 hour  Intake 240 ml  Output 400 ml  Net -160 ml   Weight change:  Exam:  General:  Pt is alert, follows commands appropriately, not in acute distress HEENT: No icterus, No thrush, No neck mass, [place]/AT Cardiovascular: RRR, S1/S2, no rubs, no gallops Respiratory: scattered bilateral rales. Abdomen: Soft/+BS, non tender, non distended, no guarding Extremities: 1 + LE edema, No lymphangitis, No petechiae, No rashes, no synovitis   Data Reviewed: I have personally reviewed following labs and imaging studies Basic Metabolic Panel: Recent Labs  Lab 09/26/23 0335 09/26/23 1313 09/27/23 0453 09/28/23 0409 09/29/23 0521 09/30/23 0715  NA  134*  --  135 136 136 139  K 2.9*  --  3.5 3.5 2.9* 2.5*  CL 97*  --  101 101 103 105  CO2 28  --  28 25 24 22   GLUCOSE 104*  --  82 106* 151* 130*  BUN 12  --  9 9 12 13   CREATININE 0.73  --  0.65 0.84 1.08 1.00  CALCIUM 7.5*  --  7.8* 8.1* 8.2* 8.3*  MG 1.8 2.0 2.0 2.0 2.1 2.1  PHOS 3.2  --   --   --   --   --    Liver Function Tests: Recent Labs  Lab 09/25/23 1420 09/27/23 0453 09/28/23 0409 09/29/23 0521 09/30/23 0715  AST 59* 50* 55* 47* 47*  ALT 25 22 23 23 23   ALKPHOS 95 90 90 80 82  BILITOT 3.7* 3.4* 4.1* 3.9* 3.1*  PROT 6.7 6.3* 7.0 7.0 6.8  ALBUMIN  2.2* 2.0* 2.2* 2.6* 2.5*   No results for input(s): LIPASE, AMYLASE in the last 168 hours. Recent Labs  Lab 09/25/23 1430 09/26/23 1313 09/27/23 0453 09/28/23 0409 09/29/23 0521  AMMONIA 38* 39* 38* 51* 45*   Coagulation Profile: No results for input(s): INR, PROTIME in the last 168 hours. CBC: Recent Labs  Lab 09/25/23 1420 09/26/23 0335 09/27/23 0453 09/29/23 0521 09/30/23 0715  WBC  9.0 4.3 4.5 6.7 6.2  NEUTROABS  --   --  3.2  --   --   HGB 11.5* 10.2* 10.7* 10.4* 10.6*  HCT 34.5* 30.2* 33.1* 32.4* 31.5*  MCV 90.8 91.2 93.2 92.6 92.1  PLT 83* 49* 55* 54* 54*   Cardiac Enzymes: No results for input(s): CKTOTAL, CKMB, CKMBINDEX, TROPONINI in the last 168 hours. BNP: Invalid input(s): POCBNP CBG: Recent Labs  Lab 09/25/23 1428  GLUCAP 121*   HbA1C: No results for input(s): HGBA1C in the last 72 hours. Urine analysis:    Component Value Date/Time   COLORURINE YELLOW 09/26/2023 0844   APPEARANCEUR CLEAR 09/26/2023 0844   APPEARANCEUR Clear 09/29/2018 1020   LABSPEC 1.010 09/26/2023 0844   PHURINE 6.0 09/26/2023 0844   GLUCOSEU NEGATIVE 09/26/2023 0844   HGBUR NEGATIVE 09/26/2023 0844   BILIRUBINUR NEGATIVE 09/26/2023 0844   BILIRUBINUR Negative 09/29/2018 1020   KETONESUR NEGATIVE 09/26/2023 0844   PROTEINUR NEGATIVE 09/26/2023 0844   NITRITE NEGATIVE 09/26/2023 0844    LEUKOCYTESUR NEGATIVE 09/26/2023 0844   Sepsis Labs: @LABRCNTIP (procalcitonin:4,lacticidven:4) ) Recent Results (from the past 240 hours)  Resp panel by RT-PCR (RSV, Flu A&B, Covid) Anterior Nasal Swab     Status: None   Collection Time: 09/26/23  8:30 AM   Specimen: Anterior Nasal Swab  Result Value Ref Range Status   SARS Coronavirus 2 by RT PCR NEGATIVE NEGATIVE Final    Comment: (NOTE) SARS-CoV-2 target nucleic acids are NOT DETECTED.  The SARS-CoV-2 RNA is generally detectable in upper respiratory specimens during the acute phase of infection. The lowest concentration of SARS-CoV-2 viral copies this assay can detect is 138 copies/mL. A negative result does not preclude SARS-Cov-2 infection and should not be used as the sole basis for treatment or other patient management decisions. A negative result may occur with  improper specimen collection/handling, submission of specimen other than nasopharyngeal swab, presence of viral mutation(s) within the areas targeted by this assay, and inadequate number of viral copies(<138 copies/mL). A negative result must be combined with clinical observations, patient history, and epidemiological information. The expected result is Negative.  Fact Sheet for Patients:  BloggerCourse.com  Fact Sheet for Healthcare Providers:  SeriousBroker.it  This test is no t yet approved or cleared by the [place]  FDA and  has been authorized for detection and/or diagnosis of SARS-CoV-2 by FDA under an Emergency Use Authorization (EUA). This EUA will remain  in effect (meaning this test can be used) for the duration of the COVID-19 declaration under Section 564(b)(1) of the Act, 21 U.S.C.section 360bbb-3(b)(1), unless the authorization is terminated  or revoked sooner.       Influenza A by PCR NEGATIVE NEGATIVE Final   Influenza B by PCR NEGATIVE NEGATIVE Final    Comment: (NOTE) The Xpert  Xpress SARS-CoV-2/FLU/RSV plus assay is intended as an aid in the diagnosis of influenza from Nasopharyngeal swab specimens and should not be used as a sole basis for treatment. Nasal washings and aspirates are unacceptable for Xpert Xpress SARS-CoV-2/FLU/RSV testing.  Fact Sheet for Patients: BloggerCourse.com  Fact Sheet for Healthcare Providers: SeriousBroker.it  This test is not yet approved or cleared by the [place]  FDA and has been authorized for detection and/or diagnosis of SARS-CoV-2 by FDA under an Emergency Use Authorization (EUA). This EUA will remain in effect (meaning this test can be used) for the duration of the COVID-19 declaration under Section 564(b)(1) of the Act, 21 U.S.C. section 360bbb-3(b)(1), unless the authorization is terminated or revoked.  Resp Syncytial Virus by PCR NEGATIVE NEGATIVE Final    Comment: (NOTE) Fact Sheet for Patients: BloggerCourse.com  Fact Sheet for Healthcare Providers: SeriousBroker.it  This test is not yet approved or cleared by the [place]  FDA and has been authorized for detection and/or diagnosis of SARS-CoV-2 by FDA under an Emergency Use Authorization (EUA). This EUA will remain in effect (meaning this test can be used) for the duration of the COVID-19 declaration under Section 564(b)(1) of the Act, 21 U.S.C. section 360bbb-3(b)(1), unless the authorization is terminated or revoked.  Performed at [facility], [address] [place], [place] [address]   Respiratory (~20 pathogens) panel by PCR     Status: None   Collection Time: 09/26/23  8:30 AM   Specimen: Nasopharyngeal Swab; Respiratory  Result Value Ref Range Status   Adenovirus NOT DETECTED NOT DETECTED Final   Coronavirus 229E NOT DETECTED NOT DETECTED Final    Comment: (NOTE) The Coronavirus on the Respiratory Panel, DOES NOT test for the novel   Coronavirus (2019 nCoV)    Coronavirus HKU1 NOT DETECTED NOT DETECTED Final   Coronavirus NL63 NOT DETECTED NOT DETECTED Final   Coronavirus OC43 NOT DETECTED NOT DETECTED Final   Metapneumovirus NOT DETECTED NOT DETECTED Final   Rhinovirus / Enterovirus NOT DETECTED NOT DETECTED Final   Influenza A NOT DETECTED NOT DETECTED Final   Influenza B NOT DETECTED NOT DETECTED Final   Parainfluenza Virus 1 NOT DETECTED NOT DETECTED Final   Parainfluenza Virus 2 NOT DETECTED NOT DETECTED Final   Parainfluenza Virus 3 NOT DETECTED NOT DETECTED Final   Parainfluenza Virus 4 NOT DETECTED NOT DETECTED Final   Respiratory Syncytial Virus NOT DETECTED NOT DETECTED Final   Bordetella pertussis NOT DETECTED NOT DETECTED Final   Bordetella Parapertussis NOT DETECTED NOT DETECTED Final   Chlamydophila pneumoniae NOT DETECTED NOT DETECTED Final   Mycoplasma pneumoniae NOT DETECTED NOT DETECTED Final    Comment: Performed at [facility], [address], [place], [place] [address]  MRSA Next Gen by PCR, Nasal     Status: None   Collection Time: 09/27/23 11:59 AM   Specimen: Nasal Mucosa; Nasal Swab  Result Value Ref Range Status   MRSA by PCR Next Gen NOT DETECTED NOT DETECTED Final    Comment: (NOTE) The GeneXpert MRSA Assay (FDA approved for NASAL specimens only), is one component of a comprehensive MRSA colonization surveillance program. It is not intended to diagnose MRSA infection nor to guide or monitor treatment for MRSA infections. Test performance is not FDA approved in patients less than 75 years old. Performed at [facility], [address] [place], [place] [address]   Culture, blood (Routine X 2) w Reflex to ID Panel     Status: None (Preliminary result)   Collection Time: 09/29/23 12:35 PM   Specimen: BLOOD LEFT HAND  Result Value Ref Range Status   Specimen Description   Final    BLOOD LEFT HAND BOTTLES DRAWN AEROBIC AND ANAEROBIC   Special Requests Blood Culture adequate  volume  Final   Culture  Setup Time   Final    IN BOTH AEROBIC AND ANAEROBIC BOTTLES GRAM POSITIVE COCCI Gram Stain Report Called to,Read Back By and Verified With: A RAY RN9/22/25 @1006  BY J. WHITE  GRAM STAIN REVIEWED-AGREE WITH RESULT DRT Organism ID to follow Performed at [facility], [address], [place], [place] [address]    Culture [facility] POSITIVE COCCI  Final   Report Status PENDING  Incomplete  Blood Culture ID Panel (Reflexed)     Status: Abnormal   Collection Time: 09/29/23 12:35 PM  Result Value Ref Range Status   Enterococcus faecalis NOT DETECTED NOT DETECTED Final   Enterococcus Faecium NOT DETECTED NOT DETECTED Final   Listeria monocytogenes NOT DETECTED NOT DETECTED Final   Staphylococcus species DETECTED (A) NOT DETECTED Final    Comment: CRITICAL RESULT CALLED TO, READ BACK BY AND VERIFIED WITH: PHARMD LORI POOLE ON 09/30/23 @ 1430 BY DRT    Staphylococcus aureus (BCID) DETECTED (A) NOT DETECTED Final    Comment: CRITICAL RESULT CALLED TO, READ BACK BY AND VERIFIED WITH: PHARMD LORI POOLE ON 09/30/23 @ 1430 BY DRT    Staphylococcus epidermidis NOT DETECTED NOT DETECTED Final   Staphylococcus lugdunensis NOT DETECTED NOT DETECTED Final   Streptococcus species NOT DETECTED NOT DETECTED Final   Streptococcus agalactiae NOT DETECTED NOT DETECTED Final   Streptococcus pneumoniae NOT DETECTED NOT DETECTED Final   Streptococcus pyogenes NOT DETECTED NOT DETECTED Final   A.calcoaceticus-baumannii NOT DETECTED NOT DETECTED Final   Bacteroides fragilis NOT DETECTED NOT DETECTED Final   Enterobacterales NOT DETECTED NOT DETECTED Final   Enterobacter cloacae complex NOT DETECTED NOT DETECTED Final   Escherichia coli NOT DETECTED NOT DETECTED Final   Klebsiella aerogenes NOT DETECTED NOT DETECTED Final   Klebsiella oxytoca NOT DETECTED NOT DETECTED Final   Klebsiella pneumoniae NOT DETECTED NOT DETECTED Final   Proteus species NOT DETECTED NOT DETECTED Final   Salmonella  species NOT DETECTED NOT DETECTED Final   Serratia marcescens NOT DETECTED NOT DETECTED Final   Haemophilus influenzae NOT DETECTED NOT DETECTED Final   Neisseria meningitidis NOT DETECTED NOT DETECTED Final   Pseudomonas aeruginosa NOT DETECTED NOT DETECTED Final   Stenotrophomonas maltophilia NOT DETECTED NOT DETECTED Final   Candida albicans NOT DETECTED NOT DETECTED Final   Candida auris NOT DETECTED NOT DETECTED Final   Candida glabrata NOT DETECTED NOT DETECTED Final   Candida krusei NOT DETECTED NOT DETECTED Final   Candida parapsilosis NOT DETECTED NOT DETECTED Final   Candida tropicalis NOT DETECTED NOT DETECTED Final   Cryptococcus neoformans/gattii NOT DETECTED NOT DETECTED Final   Meth resistant mecA/C and MREJ NOT DETECTED NOT DETECTED Final    Comment: Performed at [facility], [address], [place], [place] [address]  Culture, blood (Routine X 2) w Reflex to ID Panel     Status: None (Preliminary result)   Collection Time: 09/29/23 12:50 PM   Specimen: BLOOD RIGHT HAND  Result Value Ref Range Status   Specimen Description   Final    BLOOD RIGHT HAND BOTTLES DRAWN AEROBIC AND ANAEROBIC Performed at [facility], [address], [place], [place] [address]    Special Requests   Final    Blood Culture adequate volume Performed at [facility], [address] [place], [place] [address]    Culture  Setup Time   Final    AEROBIC BOTTLE ONLY GRAM POSITIVE COCCI Gram Stain Report Called to,Read Back By and Verified With: A RAY RN 09/30/23 @1010  BY J. WHITE GRAM STAIN REVIEWED-AGREE WITH RESULT DRT Performed at [facility], [address] [place], [place] [address]    Culture [facility] POSITIVE COCCI  Final   Report Status PENDING  Incomplete     Scheduled Meds:  Chlorhexidine  Gluconate Cloth  6 each Topical Q0600   diltiazem   60 mg Oral Q6H   hydrocortisone  sod succinate (SOLU-CORTEF ) inj  50 mg Intravenous Daily  lactulose   30 g Oral TID   levalbuterol    0.63 mg Nebulization TID   [START ON 10/01/2023] levothyroxine   50 mcg Intravenous Daily   metoprolol  tartrate  5 mg Intravenous Q6H   potassium chloride   40 mEq Oral Q4H   Continuous Infusions:  diltiazem  (CARDIZEM ) infusion Stopped (09/29/23 1304)   vancomycin      Followed by   NOREEN ON 10/01/2023] vancomycin       Procedures/Studies: DG CHEST PORT 1 VIEW Result Date: 09/29/2023 CLINICAL DATA:  In the sarcoid. EXAM: PORTABLE CHEST 1 VIEW COMPARISON:  09/25/2023 FINDINGS: Low volume film. The cardio pericardial silhouette is enlarged. Vascular congestion with diffuse interstitial opacity suggests edema. There is patchy airspace disease in the right suprahilar region and left base. No substantial pleural effusion. Telemetry leads overlie the chest. IMPRESSION: 1. Low volume film with vascular congestion and diffuse interstitial opacity suggesting edema. 2. Patchy airspace disease in the right suprahilar region and left base. Findings may reflect asymmetric edema or infection. Electronically Signed   By: Camellia Candle M.D.   On: 09/29/2023 07:08   CT HEAD WO CONTRAST ( ) Result Date: 09/26/2023 CLINICAL DATA:  Mental status change, unknown cause. EXAM: CT HEAD WITHOUT CONTRAST TECHNIQUE: Contiguous axial images were obtained from the base of the skull through the vertex without intravenous contrast. RADIATION DOSE REDUCTION: This exam was performed according to the departmental dose-optimization program which includes automated exposure control, adjustment of the mA and/or kV according to patient size and/or use of iterative reconstruction technique. COMPARISON:  Head CT 04/27/2003 FINDINGS: The study is mildly motion degraded. Brain: There is no evidence of an acute infarct, intracranial hemorrhage, mass, midline shift, or extra-axial fluid collection. Cerebral volume is within normal limits for age. The ventricles are normal in size. Vascular: Calcified atherosclerosis at the skull base. No  hyperdense vessel. Skull: No fracture or suspicious lesion. Sinuses/Orbits: Subtotal opacification of the left sphenoid sinus. Mild mucosal thickening in the left maxillary sinus. Small right mastoid effusion. Unremarkable orbits. Other: None. IMPRESSION: No evidence of acute intracranial abnormality. Electronically Signed   By: Dasie Hamburg M.D.   On: 09/26/2023 08:58   DG Chest 1 View Result Date: 09/25/2023 CLINICAL DATA:  Shortness of breath. EXAM: CHEST  1 VIEW COMPARISON:  February 20, 2022. FINDINGS: The heart size and mediastinal contours are within normal limits. Hypoinflation of the lungs is noted. Both lungs are clear. The visualized skeletal structures are unremarkable. IMPRESSION: No active disease. Electronically Signed   By: Lynwood Landy Raddle M.D.   On: 09/25/2023 16:09    Alm Schneider, DO  [facility]  If 7PM-7AM, please contact night-coverage [email or web address] Password [facility] 09/30/2023, 2:33 PM   LOS: 4 days

## 2023-09-30 NOTE — Progress Notes (Signed)
 Date and time results received: 09/30/23 0906 (use smartphrase .now to insert current time)  Test: potassium Critical Value: 2.5  Name of Provider Notified: Dr. Evonnie  Orders Received? Or Actions Taken?:

## 2023-09-30 NOTE — Progress Notes (Signed)
 PHARMACY - PHYSICIAN COMMUNICATION CRITICAL VALUE ALERT - BLOOD CULTURE IDENTIFICATION (BCID)  [name] is an 58 y.o. male who presented to [facility] on 09/25/2023 with a chief complaint of AMS  Assessment:  58 year old male with a history of HFpEF, paroxysmal atrial fibrillation, alcoholic liver cirrhosis, portal hypertension with esophageal varices, thrombocytopenia, COPD, OSA, portal/splenic/mesenteric vein thrombosis, chronic back pain obesity presented from [name] for abnormal labs.BCX + for MSSA  Name of physician (or Provider) Contacted: Dr. Evonnie  Current antibiotics: vancomycin   Changes to prescribed antibiotics recommended:  Change to cefazolin  2gm IV q8h  Results for orders placed or performed during the hospital encounter of 09/25/23  Blood Culture ID Panel (Reflexed) (Collected: 09/29/2023 12:35 PM)  Result Value Ref Range   Enterococcus faecalis NOT DETECTED NOT DETECTED   Enterococcus Faecium NOT DETECTED NOT DETECTED   Listeria monocytogenes NOT DETECTED NOT DETECTED   Staphylococcus species DETECTED (A) NOT DETECTED   Staphylococcus aureus (BCID) DETECTED (A) NOT DETECTED   Staphylococcus epidermidis NOT DETECTED NOT DETECTED   Staphylococcus lugdunensis NOT DETECTED NOT DETECTED   Streptococcus species NOT DETECTED NOT DETECTED   Streptococcus agalactiae NOT DETECTED NOT DETECTED   Streptococcus pneumoniae NOT DETECTED NOT DETECTED   Streptococcus pyogenes NOT DETECTED NOT DETECTED   A.calcoaceticus-baumannii NOT DETECTED NOT DETECTED   Bacteroides fragilis NOT DETECTED NOT DETECTED   Enterobacterales NOT DETECTED NOT DETECTED   Enterobacter cloacae complex NOT DETECTED NOT DETECTED   Escherichia coli NOT DETECTED NOT DETECTED   Klebsiella aerogenes NOT DETECTED NOT DETECTED   Klebsiella oxytoca NOT DETECTED NOT DETECTED   Klebsiella pneumoniae NOT DETECTED NOT DETECTED   Proteus species NOT DETECTED NOT DETECTED   Salmonella species NOT DETECTED  NOT DETECTED   Serratia marcescens NOT DETECTED NOT DETECTED   Haemophilus influenzae NOT DETECTED NOT DETECTED   Neisseria meningitidis NOT DETECTED NOT DETECTED   Pseudomonas aeruginosa NOT DETECTED NOT DETECTED   Stenotrophomonas maltophilia NOT DETECTED NOT DETECTED   Candida albicans NOT DETECTED NOT DETECTED   Candida auris NOT DETECTED NOT DETECTED   Candida glabrata NOT DETECTED NOT DETECTED   Candida krusei NOT DETECTED NOT DETECTED   Candida parapsilosis NOT DETECTED NOT DETECTED   Candida tropicalis NOT DETECTED NOT DETECTED   Cryptococcus neoformans/gattii NOT DETECTED NOT DETECTED   Meth resistant mecA/C and MREJ NOT DETECTED NOT DETECTED    [name] 09/30/2023  2:49 PM

## 2023-09-30 NOTE — Progress Notes (Signed)
 Pharmacy Antibiotic Note  [name] is a 58 y.o. male admitted on 09/25/2023 with bacteremia.  Pharmacy has been consulted for Vancomycin  dosing. BCX + GPC  Plan: Vancomycin  2000mg  IV loading dose then 1500 mg IV Q 12 hrs. Goal AUC 400-550. Expected AUC: 488 SCr used: 1  F/U cxs and clinical progress Monitor V/S, labs and levels as indicated  Height: 6' (182.9 cm) Weight: (!) 159.9 kg (352 lb 8.3 oz) IBW/kg (Calculated) : 77.6  Temp (24hrs), Avg:98 F (36.7 C), Min:97.6 F (36.4 C), Max:98.3 F (36.8 C)  Recent Labs  Lab 09/25/23 1420 09/26/23 0335 09/27/23 0453 09/28/23 0409 09/29/23 0521 09/30/23 0715  WBC 9.0 4.3 4.5  --  6.7 6.2  CREATININE 0.81 0.73 0.65 0.84 1.08 1.00    Estimated Creatinine Clearance: 124.3 mL/min (by C-G formula based on SCr of 1 mg/dL).    Allergies  Allergen Reactions   Allopurinol  Other (See Comments)    Joint pain worse Caused worsening gout   Fluoxetine Hcl Itching    Antimicrobials this admission: Vancomycin  9/22 >>   Microbiology results: 9/22 BCx: GPC in both sets of aerobic bottles 9/19 MRSA PCR: negative  Thank you for allowing pharmacy to be a part of this patient's care.  Concepcion Kirkpatrick, BS Pharm D, BCPS Clinical Pharmacist 09/30/2023 1:55 PM

## 2023-10-01 ENCOUNTER — Other Ambulatory Visit (HOSPITAL_COMMUNITY): Payer: Self-pay | Admitting: *Deleted

## 2023-10-01 ENCOUNTER — Inpatient Hospital Stay (HOSPITAL_COMMUNITY)

## 2023-10-01 DIAGNOSIS — R7881 Bacteremia: Secondary | ICD-10-CM | POA: Diagnosis not present

## 2023-10-01 DIAGNOSIS — I4891 Unspecified atrial fibrillation: Secondary | ICD-10-CM

## 2023-10-01 DIAGNOSIS — B9561 Methicillin susceptible Staphylococcus aureus infection as the cause of diseases classified elsewhere: Secondary | ICD-10-CM

## 2023-10-01 LAB — COMPREHENSIVE METABOLIC PANEL WITH GFR
ALT: 24 U/L (ref 0–44)
AST: 52 U/L — ABNORMAL HIGH (ref 15–41)
Albumin: 2.6 g/dL — ABNORMAL LOW (ref 3.5–5.0)
Alkaline Phosphatase: 97 U/L (ref 38–126)
Anion gap: 11 (ref 5–15)
BUN: 14 mg/dL (ref 6–20)
CO2: 24 mmol/L (ref 22–32)
Calcium: 8.4 mg/dL — ABNORMAL LOW (ref 8.9–10.3)
Chloride: 103 mmol/L (ref 98–111)
Creatinine, Ser: 1 mg/dL (ref 0.61–1.24)
GFR, Estimated: >60 mL/min (ref >60)
Glucose, Bld: 141 mg/dL — ABNORMAL HIGH (ref 70–99)
Potassium: 2.6 mmol/L — CL (ref 3.5–5.1)
Sodium: 138 mmol/L (ref 135–145)
Total Bilirubin: 2.9 mg/dL — ABNORMAL HIGH (ref 0.0–1.2)
Total Protein: 7.3 g/dL (ref 6.5–8.1)

## 2023-10-01 LAB — CBC
HCT: 33.9 % — ABNORMAL LOW (ref 39.0–52.0)
Hemoglobin: 11.2 g/dL — ABNORMAL LOW (ref 13.0–17.0)
MCH: 30.8 pg (ref 26.0–34.0)
MCHC: 33 g/dL (ref 30.0–36.0)
MCV: 93.1 fL (ref 80.0–100.0)
Platelets: 55 K/uL — ABNORMAL LOW (ref 150–400)
RBC: 3.64 MIL/uL — ABNORMAL LOW (ref 4.22–5.81)
RDW: 15.6 % — ABNORMAL HIGH (ref 11.5–15.5)
WBC: 6 K/uL (ref 4.0–10.5)
nRBC: 0 % (ref 0.0–0.2)

## 2023-10-01 LAB — AMMONIA: Ammonia: 22 umol/L (ref 9–35)

## 2023-10-01 LAB — MAGNESIUM: Magnesium: 2.2 mg/dL (ref 1.7–2.4)

## 2023-10-01 MED ORDER — METOPROLOL TARTRATE 50 MG PO TABS
50.0000 mg | ORAL_TABLET | Freq: Two times a day (BID) | ORAL | Status: DC
  Administered 2023-10-01 – 2023-10-05 (×7): 50 mg via ORAL
  Filled 2023-10-01 (×8): qty 1

## 2023-10-01 MED ORDER — LEVOTHYROXINE SODIUM 100 MCG PO TABS: 100.0000 ug | ORAL_TABLET | Freq: Every day | ORAL | Status: DC

## 2023-10-01 MED ORDER — PERFLUTREN LIPID MICROSPHERE
1.0000 mL | INTRAVENOUS | Status: AC | PRN
  Administered 2023-10-01: 3 mL via INTRAVENOUS

## 2023-10-01 MED ORDER — POTASSIUM CHLORIDE CRYS ER 20 MEQ PO TBCR
40.0000 meq | EXTENDED_RELEASE_TABLET | ORAL | Status: AC
Start: 2023-10-01 — End: 2023-10-01
  Administered 2023-10-01 (×2): 40 meq via ORAL
  Filled 2023-10-01 (×2): qty 2

## 2023-10-01 MED ORDER — DILTIAZEM HCL ER COATED BEADS 240 MG PO CP24
240.0000 mg | ORAL_CAPSULE | Freq: Every day | ORAL | Status: DC
  Administered 2023-10-01 – 2023-10-05 (×5): 240 mg via ORAL
  Filled 2023-10-01 (×5): qty 1

## 2023-10-01 MED ORDER — LEVOTHYROXINE SODIUM 125 MCG PO TABS
125.0000 ug | ORAL_TABLET | Freq: Every day | ORAL | Status: DC
  Administered 2023-10-03 – 2023-10-05 (×3): 125 ug via ORAL
  Filled 2023-10-01 (×3): qty 1

## 2023-10-01 MED ORDER — HYDROCORTISONE 20 MG PO TABS
20.0000 mg | ORAL_TABLET | Freq: Two times a day (BID) | ORAL | Status: DC
  Administered 2023-10-03 – 2023-10-05 (×5): 20 mg via ORAL
  Filled 2023-10-01: qty 2
  Filled 2023-10-01 (×7): qty 1

## 2023-10-01 MED ORDER — POTASSIUM CHLORIDE CRYS ER 20 MEQ PO TBCR
40.0000 meq | EXTENDED_RELEASE_TABLET | Freq: Once | ORAL | Status: AC
Start: 2023-10-01 — End: 2023-10-01
  Administered 2023-10-01: 40 meq via ORAL
  Filled 2023-10-01: qty 2

## 2023-10-01 MED ORDER — LEVALBUTEROL HCL 0.63 MG/3ML IN NEBU
0.6300 mg | INHALATION_SOLUTION | Freq: Once | RESPIRATORY_TRACT | Status: AC
  Administered 2023-10-01: 0.63 mg via RESPIRATORY_TRACT

## 2023-10-01 MED ORDER — LEVALBUTEROL HCL 0.63 MG/3ML IN NEBU
0.6300 mg | INHALATION_SOLUTION | Freq: Two times a day (BID) | RESPIRATORY_TRACT | Status: DC
  Administered 2023-10-02 – 2023-10-05 (×7): 0.63 mg via RESPIRATORY_TRACT
  Filled 2023-10-01 (×8): qty 3

## 2023-10-01 NOTE — Progress Notes (Signed)
*  PRELIMINARY RESULTS* Echocardiogram 2D Echocardiogram has been performed with Definity .  [name] 10/01/2023, 3:13 PM

## 2023-10-01 NOTE — Plan of Care (Signed)
  Problem: Clinical Measurements: Goal: Ability to maintain clinical measurements within normal limits will improve Outcome: Not Met (add Reason) Goal: Will remain free from infection Outcome: Not Met (add Reason) Goal: Diagnostic test results will improve Outcome: Not Met (add Reason) Goal: Respiratory complications will improve Outcome: Progressing   Problem: Activity: Goal: Risk for activity intolerance will decrease Outcome: Progressing   Problem: Coping: Goal: Level of anxiety will decrease Outcome: Progressing   Problem: Elimination: Goal: Will not experience complications related to urinary retention Outcome: Progressing

## 2023-10-01 NOTE — Progress Notes (Signed)
 PROGRESS NOTE  [name] FMW:[id number] DOB: 1966-01-05 DOA: 09/25/2023 PCP: Carleen Dallas PARAS, FNP  Brief History:    58 year old male with a history of HFpEF, paroxysmal atrial fibrillation, alcoholic liver cirrhosis, portal hypertension with esophageal varices, thrombocytopenia, COPD, OSA, portal/splenic/mesenteric vein thrombosis, chronic back pain obesity presented from [facility] for abnormal labs.  Apparently, it was reported that the patient had an elevated ammonia in the 70s.  The patient was given a dose of lactulose .  Repeat ammonia was 63.  The patient remained confused.  The patient also had low potassium.  As result he was sent to emergency department for further evaluation and treatment.    Most recently, the patient was admitted to [facility] from 09/12/2023 to 09/20/2023.  He was treated for hepatic encephalopathy and anasarca/fluid overload.  He was initially noted to have an ammonia level >100 with a history of poor compliance with lactulose .  He was noted at that time that the patient had recent cessation of alcohol about 3 weeks prior to the admission. It was noted that the patient remained intermittently confused despite improving ammonia, but he was easily redirected.  Even on the day of discharge, the patient was pleasantly confused but easily redirected. During that hospitalization he was treated for suspected cellulitis of his right lower extremity with Zosyn.  He was transition to Augmentin.  He was discharged with torsemide  20 mg daily.  He was also noted to have another hospitalization recently at [facility] from 08/18/2023 to 08/30/2023.  The patient was transferred to [facility] from OSH secondary to volume overload and CT evidence showing portal, SMV, and splenic vein thrombosis.  He underwent EGD on 08/20/2023 which showed grade 1 esophageal varices in the esophagus without bleeding.  There were multiple hyperplastic and inflammatory polyps in the antrum.  There  is no gastric varices.  GI recommended against anticoagulation given the patient's pancytopenia, presence of varices, and poor compliance.   His hospitalization was complicated by acute metabolic/hepatic encephalopathy.  His home sedating medications including BuSpar , Subutex , baclofen , and Cymbalta  were held.  Ammonia level was elevated 80.  Subsequently, his baclofen  and Subutex  were resumed which he appeared to be tolerating.  His Cymbalta  was decreased to 60 mg at nighttime.  He was discharged with lactulose  20 g twice daily.  He also had AKI secondary to overdiuresis with serum creatinine peaking at 3.14.   Assessment/Plan: Acute metabolic encephalopathy/hepatic encephalopathy - multifactorial including hepatic encephalopathy, severe hypothyroidism, Wernicke's/Korsakoff enceph - remains encephalopathic, intermittently agitated - Repeat ammonia--38>>51>>45 - B12--2954 - TSH-45.347 - folate 15.0 - Urine drug screen--neg - UA--no pyuria - CT brain--neg - Continue lactulose --increase dose to 30 g TID - 09/26/2023 ABG 7.41/51/73/32 - Minimize hypnotic medications -finished high dose thiamine  x 6 doses - more interactive but remains encephalopathic - The patient does not have capacity to make decisions regarding his person and medical care -TOC consulted--assisted in obtaining emergency guardianship from social services    MSSA Bacteremia -9/21 blood culture>> MSSA 2/2 -started cefazolin  IV -repeat blood culture 9/23 -Echo TTE ordered -plan to consult cards for TEE   Paroxysmal atrial fibrillation with RVR - continue Lopressor  IV while the patient is somnolent - 9/19 started diltiazem  drip>>transition to po 9/21 - Not a candidate for anticoagulation secondary to liver cirrhosis, thrombocytopenia, and poor compliance - 9/23--transition to long-acting diltiazem  CD and oral metoprolol    Hypothyroidism/Myxedema -TSH 45.347, Free T4 0.61, Free T3 = 1.8 -start low dose  synthroid  IV and  titrate slowly up - increase synthroid  IV to 50 mcg daily -Plan to transition to oral Synthroid  10/01/2023   Relative adrenal insufficiency -cortrosyn  stimulation 11.8>>14.4>>15.6 -started solucortef -Transition to oral Cortef    Chronic HFpEF - 08/20/2023 echo EF 55 to 60%, no WMA, normal RVF, difficult study secondary to patient's body habitus   History of alcohol abuse/polysubstance abuse - Urine drug screen--neg   Alcoholic liver cirrhosis - Patient has history of esophageal varices and mesenteric vein thrombosis - now fluid overloaded>>given lasix  IV x 1 9/21   Hypokalemia - Replete - mag 2.1   Thrombocytopenia - Secondary to liver cirrhosis - Monitor for signs of bleeding   Morbid obesity - BMI 46.94 - Lifestyle modification   Goals of Care -consult palliative -- The patient does not have capacity to make decisions regarding his person and medical care -TOC consulted--assisted in obtaining emergency guardianship from social services               Family Communication:  updated niece 9/22   Consultants:  none   Code Status:  FULL    DVT Prophylaxis:  SCDs     Procedures: As Listed in Progress Note Above   Antibiotics: cefazolin  9/22>>              Subjective: Patient denies fevers, chills, headache, chest pain, dyspnea, nausea, vomiting, diarrhea, abdominal pain, dysuria, hematuria, hematochezia, and melena.   Objective: Vitals:   09/30/23 2101 09/30/23 2119 09/30/23 2337 10/01/23 0707  BP:  137/74 134/74 (!) 145/103  Pulse:  81  75  Resp:  18    Temp:  97.8 F (36.6 C)    TempSrc:  Oral    SpO2: 91% 95%    Weight:      Height:        Intake/Output Summary (Last 24 hours) at 10/01/2023 1147 Last data filed at 10/01/2023 0505 Gross per 24 hour  Intake 1634.11 ml  Output 300 ml  Net 1334.11 ml   Weight change:  Exam:  General:  Pt is alert, follows commands appropriately, not in acute distress; pleasantly confused HEENT: No  icterus, No thrush, No neck mass, [place]/AT Cardiovascular: RRR, S1/S2, no rubs, no gallops Respiratory: Bibasilar crackles but no wheezing Abdomen: Soft/+BS, non tender, non distended, no guarding Extremities: 1+ LE edema, No lymphangitis, No petechiae, No rashes, no synovitis   Data Reviewed: I have personally reviewed following labs and imaging studies Basic Metabolic Panel: Recent Labs  Lab 09/26/23 0335 09/26/23 1313 09/27/23 0453 09/28/23 0409 09/29/23 0521 09/30/23 0715 10/01/23 0405 10/01/23 1000  NA 134*  --  135 136 136 139 138  --   K 2.9*  --  3.5 3.5 2.9* 2.5* 2.6*  --   CL 97*  --  101 101 103 105 103  --   CO2 28  --  28 25 24 22 24   --   GLUCOSE 104*  --  82 106* 151* 130* 141*  --   BUN 12  --  9 9 12 13 14   --   CREATININE 0.73  --  0.65 0.84 1.08 1.00 1.00  --   CALCIUM 7.5*  --  7.8* 8.1* 8.2* 8.3* 8.4*  --   MG 1.8   < > 2.0 2.0 2.1 2.1  --  2.2  PHOS 3.2  --   --   --   --   --   --   --    < > = values  in this interval not displayed.   Liver Function Tests: Recent Labs  Lab 09/27/23 0453 09/28/23 0409 09/29/23 0521 09/30/23 0715 10/01/23 0405  AST 50* 55* 47* 47* 52*  ALT 22 23 23 23 24   ALKPHOS 90 90 80 82 97  BILITOT 3.4* 4.1* 3.9* 3.1* 2.9*  PROT 6.3* 7.0 7.0 6.8 7.3  ALBUMIN  2.0* 2.2* 2.6* 2.5* 2.6*   No results for input(s): LIPASE, AMYLASE in the last 168 hours. Recent Labs  Lab 09/26/23 1313 09/27/23 0453 09/28/23 0409 09/29/23 0521 10/01/23 0402  AMMONIA 39* 38* 51* 45* 22   Coagulation Profile: No results for input(s): INR, PROTIME in the last 168 hours. CBC: Recent Labs  Lab 09/26/23 0335 09/27/23 0453 09/29/23 0521 09/30/23 0715 10/01/23 0405  WBC 4.3 4.5 6.7 6.2 6.0  NEUTROABS  --  3.2  --   --   --   HGB 10.2* 10.7* 10.4* 10.6* 11.2*  HCT 30.2* 33.1* 32.4* 31.5* 33.9*  MCV 91.2 93.2 92.6 92.1 93.1  PLT 49* 55* 54* 54* 55*   Cardiac Enzymes: No results for input(s): CKTOTAL, CKMB, CKMBINDEX,  TROPONINI in the last 168 hours. BNP: Invalid input(s): POCBNP CBG: Recent Labs  Lab 09/25/23 1428  GLUCAP 121*   HbA1C: No results for input(s): HGBA1C in the last 72 hours. Urine analysis:    Component Value Date/Time   COLORURINE YELLOW 09/26/2023 0844   APPEARANCEUR CLEAR 09/26/2023 0844   APPEARANCEUR Clear 09/29/2018 1020   LABSPEC 1.010 09/26/2023 0844   PHURINE 6.0 09/26/2023 0844   GLUCOSEU NEGATIVE 09/26/2023 0844   HGBUR NEGATIVE 09/26/2023 0844   BILIRUBINUR NEGATIVE 09/26/2023 0844   BILIRUBINUR Negative 09/29/2018 1020   KETONESUR NEGATIVE 09/26/2023 0844   PROTEINUR NEGATIVE 09/26/2023 0844   NITRITE NEGATIVE 09/26/2023 0844   LEUKOCYTESUR NEGATIVE 09/26/2023 0844   Sepsis Labs: @LABRCNTIP (procalcitonin:4,lacticidven:4) ) Recent Results (from the past 240 hours)  Resp panel by RT-PCR (RSV, Flu A&B, Covid) Anterior Nasal Swab     Status: None   Collection Time: 09/26/23  8:30 AM   Specimen: Anterior Nasal Swab  Result Value Ref Range Status   SARS Coronavirus 2 by RT PCR NEGATIVE NEGATIVE Final    Comment: (NOTE) SARS-CoV-2 target nucleic acids are NOT DETECTED.  The SARS-CoV-2 RNA is generally detectable in upper respiratory specimens during the acute phase of infection. The lowest concentration of SARS-CoV-2 viral copies this assay can detect is 138 copies/mL. A negative result does not preclude SARS-Cov-2 infection and should not be used as the sole basis for treatment or other patient management decisions. A negative result may occur with  improper specimen collection/handling, submission of specimen other than nasopharyngeal swab, presence of viral mutation(s) within the areas targeted by this assay, and inadequate number of viral copies(<138 copies/mL). A negative result must be combined with clinical observations, patient history, and epidemiological information. The expected result is Negative.  Fact Sheet for Patients:   BloggerCourse.com  Fact Sheet for Healthcare Providers:  SeriousBroker.it  This test is no t yet approved or cleared by the [place]  FDA and  has been authorized for detection and/or diagnosis of SARS-CoV-2 by FDA under an Emergency Use Authorization (EUA). This EUA will remain  in effect (meaning this test can be used) for the duration of the COVID-19 declaration under Section 564(b)(1) of the Act, 21 U.S.C.section 360bbb-3(b)(1), unless the authorization is terminated  or revoked sooner.       Influenza A by PCR NEGATIVE NEGATIVE Final   Influenza B by  PCR NEGATIVE NEGATIVE Final    Comment: (NOTE) The Xpert Xpress SARS-CoV-2/FLU/RSV plus assay is intended as an aid in the diagnosis of influenza from Nasopharyngeal swab specimens and should not be used as a sole basis for treatment. Nasal washings and aspirates are unacceptable for Xpert Xpress SARS-CoV-2/FLU/RSV testing.  Fact Sheet for Patients: BloggerCourse.com  Fact Sheet for Healthcare Providers: SeriousBroker.it  This test is not yet approved or cleared by the [place]  FDA and has been authorized for detection and/or diagnosis of SARS-CoV-2 by FDA under an Emergency Use Authorization (EUA). This EUA will remain in effect (meaning this test can be used) for the duration of the COVID-19 declaration under Section 564(b)(1) of the Act, 21 U.S.C. section 360bbb-3(b)(1), unless the authorization is terminated or revoked.     Resp Syncytial Virus by PCR NEGATIVE NEGATIVE Final    Comment: (NOTE) Fact Sheet for Patients: BloggerCourse.com  Fact Sheet for Healthcare Providers: SeriousBroker.it  This test is not yet approved or cleared by the [place]  FDA and has been authorized for detection and/or diagnosis of SARS-CoV-2 by FDA under an Emergency Use  Authorization (EUA). This EUA will remain in effect (meaning this test can be used) for the duration of the COVID-19 declaration under Section 564(b)(1) of the Act, 21 U.S.C. section 360bbb-3(b)(1), unless the authorization is terminated or revoked.  Performed at [facility], [address], [place], [place] [address]   Respiratory (~20 pathogens) panel by PCR     Status: None   Collection Time: 09/26/23  8:30 AM   Specimen: Nasopharyngeal Swab; Respiratory  Result Value Ref Range Status   Adenovirus NOT DETECTED NOT DETECTED Final   Coronavirus 229E NOT DETECTED NOT DETECTED Final    Comment: (NOTE) The Coronavirus on the Respiratory Panel, DOES NOT test for the novel  Coronavirus (2019 nCoV)    Coronavirus HKU1 NOT DETECTED NOT DETECTED Final   Coronavirus NL63 NOT DETECTED NOT DETECTED Final   Coronavirus OC43 NOT DETECTED NOT DETECTED Final   Metapneumovirus NOT DETECTED NOT DETECTED Final   Rhinovirus / Enterovirus NOT DETECTED NOT DETECTED Final   Influenza A NOT DETECTED NOT DETECTED Final   Influenza B NOT DETECTED NOT DETECTED Final   Parainfluenza Virus 1 NOT DETECTED NOT DETECTED Final   Parainfluenza Virus 2 NOT DETECTED NOT DETECTED Final   Parainfluenza Virus 3 NOT DETECTED NOT DETECTED Final   Parainfluenza Virus 4 NOT DETECTED NOT DETECTED Final   Respiratory Syncytial Virus NOT DETECTED NOT DETECTED Final   Bordetella pertussis NOT DETECTED NOT DETECTED Final   Bordetella Parapertussis NOT DETECTED NOT DETECTED Final   Chlamydophila pneumoniae NOT DETECTED NOT DETECTED Final   Mycoplasma pneumoniae NOT DETECTED NOT DETECTED Final    Comment: Performed at [facility], [address], [place], [place] [address]  MRSA Next Gen by PCR, Nasal     Status: None   Collection Time: 09/27/23 11:59 AM   Specimen: Nasal Mucosa; Nasal Swab  Result Value Ref Range Status   MRSA by PCR Next Gen NOT DETECTED NOT DETECTED Final    Comment: (NOTE) The GeneXpert MRSA  Assay (FDA approved for NASAL specimens only), is one component of a comprehensive MRSA colonization surveillance program. It is not intended to diagnose MRSA infection nor to guide or monitor treatment for MRSA infections. Test performance is not FDA approved in patients less than 67 years old. Performed at [facility], [address], [place], [place] [address]   Culture, blood (Routine X 2)  w Reflex to ID Panel     Status: Abnormal (Preliminary result)   Collection Time: 09/29/23 12:35 PM   Specimen: BLOOD LEFT HAND  Result Value Ref Range Status   Specimen Description   Final    BLOOD LEFT HAND BOTTLES DRAWN AEROBIC AND ANAEROBIC Performed at [facility], [address], [place], [place] [address]    Special Requests   Final    Blood Culture adequate volume Performed at [facility], [address] [place], [place] [address]    Culture  Setup Time   Final    IN BOTH AEROBIC AND ANAEROBIC BOTTLES GRAM POSITIVE COCCI Gram Stain Report Called to,Read Back By and Verified With: A RAY RN9/22/25 @1006  BY J. WHITE  GRAM STAIN REVIEWED-AGREE WITH RESULT DRT    Culture (A)  Final    STAPHYLOCOCCUS AUREUS SUSCEPTIBILITIES TO FOLLOW Performed at [facility], [address] [place], [place] [address]    Report Status PENDING  Incomplete  Blood Culture ID Panel (Reflexed)     Status: Abnormal   Collection Time: 09/29/23 12:35 PM  Result Value Ref Range Status   Enterococcus faecalis NOT DETECTED NOT DETECTED Final   Enterococcus Faecium NOT DETECTED NOT DETECTED Final   Listeria monocytogenes NOT DETECTED NOT DETECTED Final   Staphylococcus species DETECTED (A) NOT DETECTED Final    Comment: CRITICAL RESULT CALLED TO, READ BACK BY AND VERIFIED WITH: PHARMD LORI POOLE ON 09/30/23 @ 1430 BY DRT    Staphylococcus aureus (BCID) DETECTED (A) NOT DETECTED Final    Comment: CRITICAL RESULT CALLED TO, READ BACK BY AND VERIFIED WITH: PHARMD LORI POOLE ON 09/30/23 @ 1430 BY DRT     Staphylococcus epidermidis NOT DETECTED NOT DETECTED Final   Staphylococcus lugdunensis NOT DETECTED NOT DETECTED Final   Streptococcus species NOT DETECTED NOT DETECTED Final   Streptococcus agalactiae NOT DETECTED NOT DETECTED Final   Streptococcus pneumoniae NOT DETECTED NOT DETECTED Final   Streptococcus pyogenes NOT DETECTED NOT DETECTED Final   A.calcoaceticus-baumannii NOT DETECTED NOT DETECTED Final   Bacteroides fragilis NOT DETECTED NOT DETECTED Final   Enterobacterales NOT DETECTED NOT DETECTED Final   Enterobacter cloacae complex NOT DETECTED NOT DETECTED Final   Escherichia coli NOT DETECTED NOT DETECTED Final   Klebsiella aerogenes NOT DETECTED NOT DETECTED Final   Klebsiella oxytoca NOT DETECTED NOT DETECTED Final   Klebsiella pneumoniae NOT DETECTED NOT DETECTED Final   Proteus species NOT DETECTED NOT DETECTED Final   Salmonella species NOT DETECTED NOT DETECTED Final   Serratia marcescens NOT DETECTED NOT DETECTED Final   Haemophilus influenzae NOT DETECTED NOT DETECTED Final   Neisseria meningitidis NOT DETECTED NOT DETECTED Final   Pseudomonas aeruginosa NOT DETECTED NOT DETECTED Final   Stenotrophomonas maltophilia NOT DETECTED NOT DETECTED Final   Candida albicans NOT DETECTED NOT DETECTED Final   Candida auris NOT DETECTED NOT DETECTED Final   Candida glabrata NOT DETECTED NOT DETECTED Final   Candida krusei NOT DETECTED NOT DETECTED Final   Candida parapsilosis NOT DETECTED NOT DETECTED Final   Candida tropicalis NOT DETECTED NOT DETECTED Final   Cryptococcus neoformans/gattii NOT DETECTED NOT DETECTED Final   Meth resistant mecA/C and MREJ NOT DETECTED NOT DETECTED Final    Comment: Performed at [facility], [address], [place], [place] [address]  Culture, blood (Routine X 2) w Reflex to ID Panel     Status: Abnormal (Preliminary result)   Collection Time: 09/29/23 12:50 PM   Specimen: BLOOD RIGHT  HAND  Result Value Ref Range Status   Specimen  Description   Final    BLOOD RIGHT HAND BOTTLES DRAWN AEROBIC AND ANAEROBIC Performed at [facility], [address], [place], [place] [address]    Special Requests   Final    Blood Culture adequate volume Performed at [facility], [address], [place], [place] [address]    Culture  Setup Time   Final    AEROBIC BOTTLE ONLY GRAM POSITIVE COCCI Gram Stain Report Called to,Read Back By and Verified With: A RAY RN 09/30/23 @1010  BY J. WHITE GRAM STAIN REVIEWED-AGREE WITH RESULT DRT Performed at [facility], [address], [place], [place] [address]    Culture STAPHYLOCOCCUS AUREUS (A)  Final   Report Status PENDING  Incomplete  Culture, blood (Routine X 2) w Reflex to ID Panel     Status: None (Preliminary result)   Collection Time: 10/01/23  4:02 AM   Specimen: BLOOD LEFT ARM  Result Value Ref Range Status   Specimen Description BLOOD LEFT ARM  Final   Special Requests   Final    BOTTLES DRAWN AEROBIC AND ANAEROBIC Blood Culture adequate volume   Culture   Final    NO GROWTH <12 HOURS Performed at [facility], [address], [place], [place] [address]    Report Status PENDING  Incomplete  Culture, blood (Routine X 2) w Reflex to ID Panel     Status: None (Preliminary result)   Collection Time: 10/01/23  4:05 AM   Specimen: BLOOD LEFT HAND  Result Value Ref Range Status   Specimen Description BLOOD LEFT HAND  Final   Special Requests   Final    BOTTLES DRAWN AEROBIC AND ANAEROBIC Blood Culture adequate volume   Culture   Final    NO GROWTH <12 HOURS Performed at [facility], [address] [place], [place] [address]    Report Status PENDING  Incomplete     Scheduled Meds:  Chlorhexidine  Gluconate Cloth  6 each Topical Q0600   diltiazem   240 mg Oral Daily   hydrocortisone  sod succinate (SOLU-CORTEF ) inj  50 mg Intravenous Daily   lactulose   30 g Oral TID   levalbuterol   0.63 mg Nebulization BID   levothyroxine   50 mcg Intravenous Daily   metoprolol  tartrate   5 mg Intravenous Q6H   potassium chloride   40 mEq Oral Q4H   thiamine   100 mg Oral Daily   Continuous Infusions:   ceFAZolin  (ANCEF ) IV 2 g (10/01/23 0628)    Procedures/Studies: DG CHEST PORT 1 VIEW Result Date: 09/29/2023 CLINICAL DATA:  In the sarcoid. EXAM: PORTABLE CHEST 1 VIEW COMPARISON:  09/25/2023 FINDINGS: Low volume film. The cardio pericardial silhouette is enlarged. Vascular congestion with diffuse interstitial opacity suggests edema. There is patchy airspace disease in the right suprahilar region and left base. No substantial pleural effusion. Telemetry leads overlie the chest. IMPRESSION: 1. Low volume film with vascular congestion and diffuse interstitial opacity suggesting edema. 2. Patchy airspace disease in the right suprahilar region and left base. Findings may reflect asymmetric edema or infection. Electronically Signed   By: Camellia Candle M.D.   On: 09/29/2023 07:08   CT HEAD WO CONTRAST ( ) Result Date: 09/26/2023 CLINICAL DATA:  Mental status change, unknown cause. EXAM: CT HEAD WITHOUT CONTRAST TECHNIQUE: Contiguous axial images were obtained from the base of the skull through the vertex without intravenous contrast. RADIATION DOSE REDUCTION: This exam was performed according to the departmental dose-optimization program which  includes automated exposure control, adjustment of the mA and/or kV according to patient size and/or use of iterative reconstruction technique. COMPARISON:  Head CT 04/27/2003 FINDINGS: The study is mildly motion degraded. Brain: There is no evidence of an acute infarct, intracranial hemorrhage, mass, midline shift, or extra-axial fluid collection. Cerebral volume is within normal limits for age. The ventricles are normal in size. Vascular: Calcified atherosclerosis at the skull base. No hyperdense vessel. Skull: No fracture or suspicious lesion. Sinuses/Orbits: Subtotal opacification of the left sphenoid sinus. Mild mucosal thickening in the left  maxillary sinus. Small right mastoid effusion. Unremarkable orbits. Other: None. IMPRESSION: No evidence of acute intracranial abnormality. Electronically Signed   By: Dasie Hamburg M.D.   On: 09/26/2023 08:58   DG Chest 1 View Result Date: 09/25/2023 CLINICAL DATA:  Shortness of breath. EXAM: CHEST  1 VIEW COMPARISON:  February 20, 2022. FINDINGS: The heart size and mediastinal contours are within normal limits. Hypoinflation of the lungs is noted. Both lungs are clear. The visualized skeletal structures are unremarkable. IMPRESSION: No active disease. Electronically Signed   By: Lynwood Landy Raddle M.D.   On: 09/25/2023 16:09    Alm Schneider, DO  [facility]  If 7PM-7AM, please contact night-coverage [email or web address] Password [facility] 10/01/2023, 11:47 AM   LOS: 5 days

## 2023-10-01 NOTE — Plan of Care (Signed)

## 2023-10-01 NOTE — TOC Progression Note (Signed)
 Transition of Care [facility]) - Progression Note    Patient Details  Name: [name] MRN: [id number] Date of Birth: 1965/12/20  [facility] CM/SW Contact  Sharlyne Stabs, RN Phone Number: 10/01/2023, 12:24 PM  Clinical Narrative:   APS assessed patient for Legal Guardian.  JHill requested MD's progress note to go with her assessment. They will be going to the courts for emergency custody.  Progress note emailed to [email or web address]. TOC following.    Expected Discharge Plan: Skilled Nursing Facility Barriers to Discharge: Continued Medical Work up     Expected Discharge Plan and Services      Living arrangements for the past 2 months: Single Family Home        Social Drivers of Health (SDOH) Interventions SDOH Screenings   Food Insecurity: Patient Unable To Answer (09/25/2023)  Housing: Low Risk  (09/30/2023)  Transportation Needs: Patient Unable To Answer (09/25/2023)  Utilities: Patient Unable To Answer (09/25/2023)  Recent Concern: Utilities - At Risk (08/18/2023)   Received from [facility]  Depression (PHQ2-9): Low Risk  (03/07/2020)  Financial Resource Strain: Low Risk  (07/22/2023)   [facility]  Physical Activity: Inactive (07/22/2023)   [facility] from [facility]  Social Connections: Moderately Isolated (07/22/2023)   Received from [facility]  Stress: No Stress Concern Present (08/18/2023)   Received from [facility]  Tobacco Use: High Risk (09/25/2023)  Health Literacy: Low Risk  (07/22/2023)   Received from [facility]    Readmission Risk Interventions     No data to display

## 2023-10-01 NOTE — Progress Notes (Signed)
 Date of Admission:  09/25/2023    ID: [name] is a 58 y.o. male Principal Problem:   Acute metabolic encephalopathy Active Problems:   Obstructive sleep apnea of adult   Hypertension, essential   Atrial fibrillation (HCC)   Cirrhosis (HCC)   Thrombocytopenia   Hypokalemia   CHF (congestive heart failure) (HCC)   Esophageal varices (HCC)   Paroxysmal atrial fibrillation with RVR (HCC)   Hepatic encephalopathy (HCC)   Gram-positive bacteremia  Patient seen through telehealth video visit Nurse in charge at bedside Subjective: He is feeling better He is a little bit more alert and oriented  Medications:   Chlorhexidine  Gluconate Cloth  6 each Topical Q0600   diltiazem   240 mg Oral Daily   [START ON 10/03/2023] hydrocortisone   20 mg Oral BID   hydrocortisone  sod succinate (SOLU-CORTEF ) inj  50 mg Intravenous Daily   lactulose   30 g Oral TID   levalbuterol   0.63 mg Nebulization BID   [START ON 10/03/2023] levothyroxine   125 mcg Oral Q0600   levothyroxine   50 mcg Intravenous Daily   metoprolol  tartrate  50 mg Oral BID   thiamine   100 mg Oral Daily    Objective: Vital signs in last 24 hours: Patient Vitals for the past 24 hrs:  BP Temp Temp src Pulse Resp SpO2  10/01/23 2001 123/75 97.6 F (36.4 C) Oral 65 -- 99 %  10/01/23 1703 -- -- -- -- -- 96 %  10/01/23 0707 (!) 145/103 -- -- 75 -- --  09/30/23 2337 134/74 -- -- -- -- --  09/30/23 2119 137/74 97.8 F (36.6 C) Oral 81 18 95 %  09/30/23 2101 -- -- -- -- -- 91 %     Lines and Device Date on insertion # of days DC  Chiropractor     Foley     ETT       PHYSICAL EXAM:  General: Alert, cooperative, no distress male confusion Increased BMI  Abdomen: Prominent veins Periumbilical hernia edema Extremities: Legs Edema scrotum Intertrigo in the groin area  skin: Bruising over the arms Lymph: Cervical, supraclavicular normal. Neurologic: Grossly non-focal Moves all limbs Lab Results     Latest Ref Rng & Units 10/01/2023    4:05 AM 09/30/2023    7:15 AM 09/29/2023    5:21 AM  CBC  WBC 4.0 - 10.5 K/uL 6.0  6.2  6.7   Hemoglobin 13.0 - 17.0 g/dL 88.7  89.3  89.5   Hematocrit 39.0 - 52.0 % 33.9  31.5  32.4   Platelets 150 - 400 K/uL 55  54  54        Latest Ref Rng & Units 10/01/2023    4:05 AM 09/30/2023    7:15 AM 09/29/2023    5:21 AM  CMP  Glucose 70 - 99 mg/dL 858  869  848   BUN 6 - 20 mg/dL 14  13  12    Creatinine 0.61 - 1.24 mg/dL 8.99  8.99  8.91   Sodium 135 - 145 mmol/L 138  139  136   Potassium 3.5 - 5.1 mmol/L 2.6  2.5  2.9   Chloride 98 - 111 mmol/L 103  105  103   CO2 22 - 32 mmol/L 24  22  24    Calcium 8.9 - 10.3 mg/dL 8.4  8.3  8.2   Total Protein 6.5 - 8.1 g/dL 7.3  6.8  7.0   Total Bilirubin 0.0 - 1.2 mg/dL 2.9  3.1  3.9   Alkaline Phos 38 - 126 U/L 97  82  80   AST 15 - 41 U/L 52  47  47   ALT 0 - 44 U/L 24  23  23        Microbiology: 09/29/2023 blood culture staph aureus 4 out of 4 10/01/2023 blood cultures have been sent Studies/Results: ECHOCARDIOGRAM COMPLETE Result Date: 10/01/2023    ECHOCARDIOGRAM REPORT   Patient Name:   [name] Date of Exam: 10/01/2023 Medical Rec #:  [id number]        Height:       72.0 in Accession #:    [id number]       Weight:       352.5 lb Date of Birth:  April 21, 1965        BSA:          2.711 m Patient Age:    58 years         BP:           145/103 mmHg Patient Gender: M                HR:           75 bpm. Exam Location:  [facility] Procedure: 2D Echo, Cardiac Doppler, Color Doppler and Intracardiac            Opacification Agent (Both Spectral and Color Flow Doppler were            utilized during procedure). Indications:    Bacteremia R78.81  History:        Patient has prior history of Echocardiogram examinations, most                 recent 04/22/2019. CHF, Arrythmias:Atrial Fibrillation; Risk                 Factors:Hypertension, Diabetes, Dyslipidemia, Non-Smoker and                 Sleep Apnea. Hx of ETOH  abuse and morbid obesity.  Sonographer:    Aida Pizza RCS Referring Phys: [phone] DAVID TAT  Sonographer Comments: Suboptimal apical window, suboptimal subcostal window and Technically difficult study due to poor echo windows. Image acquisition challenging due to patient body habitus. IMPRESSIONS  1. Images are limited.  2. Left ventricular ejection fraction, by estimation, is 70 to 75%. The left ventricle has hyperdynamic function. The left ventricle has no regional wall motion abnormalities. There is moderate asymmetric left ventricular hypertrophy of the basal segment. Left ventricular diastolic parameters are indeterminate.  3. Right ventricular systolic function is normal. The right ventricular size is mildly enlarged. Tricuspid regurgitation signal is inadequate for assessing PA pressure.  4. Left atrial size was severely dilated.  5. The mitral valve is grossly normal. Trivial mitral valve regurgitation.  6. The aortic valve is tricuspid. Aortic valve regurgitation is trivial. Probable lambl's excrescences at leafet tips.  7. Aortic dilatation noted. There is borderline dilatation of the aortic root, measuring 40 mm.  8. Unable to estimate CVP. Comparison(s): Prior images reviewed side by side. No diagnostic valvular vegetations, consider TEE if high clinical suspicion of endocarditis. FINDINGS  Left Ventricle: Left ventricular ejection fraction, by estimation, is 70 to 75%. The left ventricle has hyperdynamic function. The left ventricle has no regional wall motion abnormalities. Definity  contrast agent was given IV to delineate the left ventricular endocardial borders. The left ventricular internal cavity size was normal in size. There is moderate asymmetric left ventricular hypertrophy  of the basal segment. Left ventricular diastolic function could not be evaluated due to atrial fibrillation. Left ventricular diastolic parameters are indeterminate. Right Ventricle: The right ventricular size is mildly  enlarged. No increase in right ventricular wall thickness. Right ventricular systolic function is normal. Tricuspid regurgitation signal is inadequate for assessing PA pressure. Left Atrium: Left atrial size was severely dilated. Right Atrium: Right atrial size was normal in size. Pericardium: There is no evidence of pericardial effusion. Presence of epicardial fat layer. Mitral Valve: The mitral valve is grossly normal. Trivial mitral valve regurgitation. Tricuspid Valve: The tricuspid valve is grossly normal. Tricuspid valve regurgitation is trivial. Aortic Valve: The aortic valve is tricuspid. Aortic valve regurgitation is trivial. Pulmonic Valve: The pulmonic valve was grossly normal. Pulmonic valve regurgitation is trivial. Aorta: Aortic dilatation noted. There is borderline dilatation of the aortic root, measuring 40 mm. Venous: Unable to estimate CVP. IAS/Shunts: No atrial level shunt detected by color flow Doppler. Additional Comments: 3D was performed not requiring image post processing on an independent workstation and was indeterminate.  LEFT VENTRICLE PLAX 2D LVIDd:         5.30 cm   Diastology LVIDs:         2.30 cm   LV e' medial:    9.57 cm/s LV PW:         1.40 cm   LV E/e' medial:  9.2 LV IVS:        1.20 cm   LV e' lateral:   9.32 cm/s LVOT diam:     1.90 cm   LV E/e' lateral: 9.5 LV SV:         48 LV SV Index:   18 LVOT Area:     2.84 cm  RIGHT VENTRICLE RV S prime:     18.00 cm/s TAPSE (M-mode): 1.6 cm LEFT ATRIUM              Index        RIGHT ATRIUM           Index LA diam:        5.30 cm  1.96 cm/m   RA Area:     20.00 cm LA Vol (A2C):   116.0 ml 42.79 ml/m  RA Volume:   59.40 ml  21.91 ml/m LA Vol (A4C):   193.0 ml 71.20 ml/m LA Biplane Vol: 163.0 ml 60.13 ml/m  AORTIC VALVE LVOT Vmax:   82.90 cm/s LVOT Vmean:  51.850 cm/s LVOT VTI:    0.168 m  AORTA Ao Root diam: 4.00 cm MITRAL VALVE MV Area (PHT): 2.24 cm    SHUNTS MV Decel Time: 338 msec    Systemic VTI:  0.17 m MV E velocity:  88.20 cm/s  Systemic Diam: 1.90 cm Jayson Sierras MD Electronically signed by Jayson Sierras MD Signature Date/Time: 10/01/2023/3:24:46 PM    Final      Assessment/Plan: Acute metabolic encephalopathy secondary to hepatic encephalopathy Patient was recently in [facility] admitted for the same condition and then sent to SNF but came to [facility] on 917 with confusion and hypokalemia  Alcoholic cirrhosis with decompensation with portal portal hypertension with thrombocytopenia and esophageal varices Portal vein, splenic vein and mesenteric vein thrombosis.  MSSA bacteremia No cardiac devices no central lines He has chronic changes in the skin over his legs so could that be an entry point for the staph 2D echo has been done TEE would be needed but he has esophageal varices and thrombocytopenia.  Wonder  whether GI would help cardiology do the procedure Recommend imaging abdomen chest and also  the spine as he has high bioburden. Continue cefazolin .  Is going to need for a long time Repeat blood cultures have been sent   Anasarca  CHF Paroxysmal A-fib on diltiazem   Morbid obesity  Myxedema newly diagnosed during this admission On thyroxine  Discussed with the hospitalist

## 2023-10-02 DIAGNOSIS — G9341 Metabolic encephalopathy: Secondary | ICD-10-CM | POA: Diagnosis not present

## 2023-10-02 LAB — COMPREHENSIVE METABOLIC PANEL WITH GFR
ALT: 21 U/L (ref 0–44)
AST: 59 U/L — ABNORMAL HIGH (ref 15–41)
Albumin: 2.5 g/dL — ABNORMAL LOW (ref 3.5–5.0)
Alkaline Phosphatase: 95 U/L (ref 38–126)
Anion gap: 9 (ref 5–15)
BUN: 16 mg/dL (ref 6–20)
CO2: 23 mmol/L (ref 22–32)
Calcium: 8.6 mg/dL — ABNORMAL LOW (ref 8.9–10.3)
Chloride: 101 mmol/L (ref 98–111)
Creatinine, Ser: 1.08 mg/dL (ref 0.61–1.24)
GFR, Estimated: >60 mL/min (ref >60)
Glucose, Bld: 125 mg/dL — ABNORMAL HIGH (ref 70–99)
Potassium: 3.1 mmol/L — ABNORMAL LOW (ref 3.5–5.1)
Sodium: 133 mmol/L — ABNORMAL LOW (ref 135–145)
Total Bilirubin: 2.7 mg/dL — ABNORMAL HIGH (ref 0.0–1.2)
Total Protein: 7 g/dL (ref 6.5–8.1)

## 2023-10-02 LAB — CBC
HCT: 34.6 % — ABNORMAL LOW (ref 39.0–52.0)
Hemoglobin: 11.5 g/dL — ABNORMAL LOW (ref 13.0–17.0)
MCH: 30.9 pg (ref 26.0–34.0)
MCHC: 33.2 g/dL (ref 30.0–36.0)
MCV: 93 fL (ref 80.0–100.0)
Platelets: 46 K/uL — ABNORMAL LOW (ref 150–400)
RBC: 3.72 MIL/uL — ABNORMAL LOW (ref 4.22–5.81)
RDW: 16 % — ABNORMAL HIGH (ref 11.5–15.5)
WBC: 5.5 K/uL (ref 4.0–10.5)
nRBC: 0 % (ref 0.0–0.2)

## 2023-10-02 LAB — CULTURE, BLOOD (ROUTINE X 2)
Special Requests: ADEQUATE
Special Requests: ADEQUATE

## 2023-10-02 LAB — ECHOCARDIOGRAM COMPLETE
Area-P 1/2: 2.24 cm2
Height: 72 in
S' Lateral: 2.3 cm
Weight: 5640.25 [oz_av]

## 2023-10-02 LAB — MAGNESIUM: Magnesium: 2.1 mg/dL (ref 1.7–2.4)

## 2023-10-02 MED ORDER — POTASSIUM CHLORIDE CRYS ER 20 MEQ PO TBCR
40.0000 meq | EXTENDED_RELEASE_TABLET | ORAL | Status: AC
  Administered 2023-10-02 (×2): 40 meq via ORAL
  Filled 2023-10-02 (×2): qty 2

## 2023-10-02 MED ORDER — NYSTATIN 100000 UNIT/GM EX POWD
Freq: Two times a day (BID) | CUTANEOUS | Status: DC
  Filled 2023-10-02 (×2): qty 15

## 2023-10-02 MED ORDER — MAGIC MOUTHWASH
10.0000 mL | Freq: Three times a day (TID) | ORAL | Status: DC
  Administered 2023-10-02 – 2023-10-05 (×6): 10 mL via ORAL
  Filled 2023-10-02 (×13): qty 10

## 2023-10-02 NOTE — Progress Notes (Signed)
   10/02/23 1138  TOC Guardianship  DSS Guardianship request started Yes  DSS Guardianship request start date 10/01/23  DSS Service county [place]  DSS Date of decision notification 10/01/23  DSS Guardianship decision Substantiated  Guardianship court date 10/01/23  New Guardianship awarded Yes

## 2023-10-02 NOTE — Evaluation (Signed)
 Physical Therapy Evaluation Patient Details Name: [name] MRN: [id number] DOB: August 18, 1965 Today's Date: 10/02/2023  History of Present Illness  [name] is a 58 y.o. male with medical history significant for diastolic CHF, asthma, atrial fibrillation, CHF, colic liver cirrhosis with esophageal varices and hepatic encephalopathy history.   Patient was brought to the ED from nursing home with reports of abnormal ammonia, and low potassium level.  At time of my evaluation, patient is awake and alert, is able to tell me his name but not much else.  He is able to talk but his speech is confused.  Per staff at nursing home, ammonia was initially in the 70s, he was given a dose of lactulose  and on repeat ammonia was 63, with altered mental status he was sent to the ED.     Patient was recently admitted at [facility], 9 days on discharged 9/12, was managed for hepatic encephalopathy, volume overload.  Admitting ammonia was greater than 100, and was 20 head CT was negative for acute abnormal.  Per discharge summary, he remained intermittently confused but was easily redirected with but no focal neurologic deficits.   Clinical Impression  Patient requires HOB fully raised for sitting up at bedside, once seated able to maintain sitting balance and tolerated 2 trials of taking 3-4 side steps at bedside before having to sit due to c/o fatigue. Patient tolerated sitting up at bedside after therapy with nursing staff in room. Patient will benefit from continued skilled physical therapy in hospital and recommended venue below to increase strength, balance, endurance for safe ADLs and gait.          If plan is discharge home, recommend the following: A lot of help with bathing/dressing/bathroom;Help with stairs or ramp for entrance;Assistance with cooking/housework;Assist for transportation;A lot of help with walking and/or transfers   Can travel by private vehicle   No    Equipment  Recommendations Rolling walker (2 wheels)  Recommendations for Other Services       Functional Status Assessment Patient has had a recent decline in their functional status and demonstrates the ability to make significant improvements in function in a reasonable and predictable amount of time.     Precautions / Restrictions Precautions Precautions: Fall Recall of Precautions/Restrictions: Impaired Restrictions Weight Bearing Restrictions Per Provider Order: No      Mobility  Bed Mobility Overal bed mobility: Needs Assistance Bed Mobility: Supine to Sit     Supine to sit: Mod assist, HOB elevated     General bed mobility comments: required HOB fully raised for completing supine to sitting, has difficulty moving legs due to weakness    Transfers Overall transfer level: Needs assistance Equipment used: Rolling walker (2 wheels) Transfers: Sit to/from Stand Sit to Stand: Contact guard assist, Min assist           General transfer comment: labored movement    Ambulation/Gait Ambulation/Gait assistance: Min assist, Mod assist Gait Distance (Feet): 3 Feet Assistive device: Rolling walker (2 wheels) Gait Pattern/deviations: Decreased step length - left, Decreased stance time - right, Decreased stride length, Trunk flexed Gait velocity: decreased     General Gait Details: limited to a few side steps at bedside before having to sit due to fatigue  Stairs            Wheelchair Mobility     Tilt Bed    Modified Rankin (Stroke Patients Only)       Balance Overall balance assessment: Needs assistance Sitting-balance  support: Feet supported, No upper extremity supported Sitting balance-Leahy Scale: Fair Sitting balance - Comments: fair/good seated at EOB   Standing balance support: Reliant on assistive device for balance, During functional activity, Bilateral upper extremity supported Standing balance-Leahy Scale: Poor Standing balance comment: fair/poor  using RW                             Pertinent Vitals/Pain Pain Assessment Pain Assessment: No/denies pain    Home Living Family/patient expects to be discharged to:: Private residence Living Arrangements: Non-relatives/Friends Available Help at Discharge: Family;Available PRN/intermittently Type of Home: House Home Access: Stairs to enter Entrance Stairs-Rails: None Entrance Stairs-Number of Steps: 1   Home Layout: One level Home Equipment: Crutches;BSC/3in1;Shower seat;Grab bars - toilet Additional Comments: Heavy Duty Axillary Crutches    Prior Function               Mobility Comments: household and short distanced community ambulation with RW PRN ADLs Comments: Independent     Extremity/Trunk Assessment   Upper Extremity Assessment Upper Extremity Assessment: Generalized weakness    Lower Extremity Assessment Lower Extremity Assessment: Generalized weakness    Cervical / Trunk Assessment Cervical / Trunk Assessment: Kyphotic  Communication   Communication Communication: Impaired Factors Affecting Communication: Difficulty expressing self    Cognition Arousal: Alert Behavior During Therapy: Flat affect   PT - Cognitive impairments: History of cognitive impairments, No family/caregiver present to determine baseline                       PT - Cognition Comments: requires repeated verbal/tactile cueing for following directions Following commands: Impaired Following commands impaired: Follows one step commands with increased time     Cueing Cueing Techniques: Verbal cues, Tactile cues     General Comments      Exercises     Assessment/Plan    PT Assessment Patient needs continued PT services  PT Problem List Decreased strength;Decreased activity tolerance;Decreased balance;Decreased mobility       PT Treatment Interventions DME instruction;Gait training;Stair training;Functional mobility training;Therapeutic  activities;Therapeutic exercise;Balance training;Patient/family education    PT Goals (Current goals can be found in the Care Plan section)  Acute Rehab PT Goals Patient Stated Goal: return home PT Goal Formulation: With patient Time For Goal Achievement: 10/16/23 Potential to Achieve Goals: Good    Frequency Min 3X/week     Co-evaluation               AM-PAC PT 6 Clicks Mobility  Outcome Measure Help needed turning from your back to your side while in a flat bed without using bedrails?: A Lot Help needed moving from lying on your back to sitting on the side of a flat bed without using bedrails?: A Lot Help needed moving to and from a bed to a chair (including a wheelchair)?: A Little Help needed standing up from a chair using your arms (e.g., wheelchair or bedside chair)?: A Little Help needed to walk in hospital room?: A Lot Help needed climbing 3-5 steps with a railing? : A Lot 6 Click Score: 14    End of Session   Activity Tolerance: Patient tolerated treatment well;Patient limited by fatigue Patient left: in bed;with call bell/phone within reach;with nursing/sitter in room Nurse Communication: Mobility status PT Visit Diagnosis: Muscle weakness (generalized) (M62.81);Unsteadiness on feet (R26.81);Other abnormalities of gait and mobility (R26.89)    Time: 1330-1407 PT Time Calculation (min) (ACUTE ONLY): 37 min  Charges:   PT Evaluation $PT Eval Moderate Complexity: 1 Mod PT Treatments $Therapeutic Activity: 23-37 mins PT General Charges $$ ACUTE PT VISIT: 1 Visit         2:34 PM, 10/02/23 Lynwood Music, MPT Physical Therapist with [facility] 336 [phone] office [phone] mobile phone

## 2023-10-02 NOTE — Plan of Care (Signed)

## 2023-10-02 NOTE — Plan of Care (Signed)
  Problem: Clinical Measurements: Goal: Ability to maintain clinical measurements within normal limits will improve Outcome: Progressing Goal: Will remain free from infection Outcome: Progressing Goal: Respiratory complications will improve Outcome: Progressing   Problem: Activity: Goal: Risk for activity intolerance will decrease Outcome: Progressing   Problem: Coping: Goal: Level of anxiety will decrease Outcome: Progressing   Problem: Elimination: Goal: Will not experience complications related to bowel motility Outcome: Progressing   Problem: Pain Managment: Goal: General experience of comfort will improve and/or be controlled Outcome: Progressing   Problem: Safety: Goal: Ability to remain free from injury will improve Outcome: Progressing

## 2023-10-02 NOTE — TOC Progression Note (Addendum)
 Transition of Care [facility]) - Progression Note    Patient Details  Name: [name] MRN: [id number] Date of Birth: 01-07-1966  [facility] CM/SW Contact  Sharlyne Stabs, RN Phone Number: 10/02/2023, 10:28 AM  Clinical Narrative:   [place] with APS updated TOC that the court approved emergency Custody yesterday, it was filled with the Sheriff's office late evening. Debbie at [facility] updated. Patient needs TEE, discharge unknown. She is requesting PT eval and Auth for him to return, he does not have medicaid. TOC updating the team and following to start Auth.   Addendum :   Legal Guardian added to Contact list and Flag added to chart.  Paper took to 300 for them to be scanned into the chart.   TOC CMA starting Auth.    Expected Discharge Plan: Skilled Nursing Facility Barriers to Discharge: Continued Medical Work up  Expected Discharge Plan and Services      Living arrangements for the past 2 months: Single Family Home       Social Drivers of Health (SDOH) Interventions SDOH Screenings   Food Insecurity: Patient Unable To Answer (09/25/2023)  Housing: Low Risk  (09/30/2023)  Transportation Needs: Patient Unable To Answer (09/25/2023)  Utilities: Patient Unable To Answer (09/25/2023)  Recent Concern: Utilities - At Risk (08/18/2023)   Received from [facility]  Depression (PHQ2-9): Low Risk  (03/07/2020)  Financial Resource Strain: Low Risk  (07/22/2023)   Received from [facility]  Physical Activity: Inactive (07/22/2023)   Received from [facility]  Social Connections: Moderately Isolated (07/22/2023)   Received from [facility]  Stress: No Stress Concern Present (08/18/2023)   Received from [facility]  Tobacco Use: High Risk (09/25/2023)  Health Literacy: Low Risk  (07/22/2023)   Received from [facility]    Readmission Risk Interventions     No data to display

## 2023-10-02 NOTE — Progress Notes (Signed)
   Progress Note  Patient Name: [name] Date of Encounter: 10/02/2023  Primary Cardiologist: Lynwood Schilling, MD  TEE requested by Dr. Maree at the direction of ID service for further evaluation of MSSA bacteremia.  Transthoracic echocardiogram from yesterday demonstrated LVEF 70 to 75% with moderate asymmetric LV hypertrophy, severe left atrial enlargement, trivial mitral regurgitation, probable Lambl's excrescences at aortic valve leaflet tips with trivial aortic regurgitation, and borderline aortic root dilatation.  No definite valvular vegetations visualized.  Comorbidities include cirrhosis with thrombocytopenia (platelets 46) and esophageal varices, myxedema with hypothyroidism on IV thyroxine.  I do not agree with pursuing TEE at this time given very high risk of complication.  A very reasonable argument could be made to treat him with a course of antibiotics to cover potential endocarditis and also skin/soft tissue source of infection which is even more likely.  For questions or updates, please contact [facility] Please consult [email or web address] for contact info under   Signed, Jayson Sierras, MD  10/02/2023, 10:28 AM

## 2023-10-02 NOTE — Progress Notes (Signed)
 PROGRESS NOTE    [name]  FMW:[id number] DOB: 08-21-1965 DOA: 09/25/2023 PCP: Carleen Dallas PARAS, FNP   Brief Narrative:    58 year old male with a history of HFpEF, paroxysmal atrial fibrillation, alcoholic liver cirrhosis, portal hypertension with esophageal varices, thrombocytopenia, COPD, OSA, portal/splenic/mesenteric vein thrombosis, chronic back pain obesity presented from [facility] for abnormal labs.  Patient was admitted with acute metabolic encephalopathy/hepatic encephalopathy and continues to remain encephalopathic, but is now more interactive.  He did not have capacity to make decisions and has emergency guardianship.  He was also noted to have MSSA bacteremia and cannot have TEE due to esophageal varices and issues with thrombocytopenia.  He will be treated for presumptive endocarditis with placement of PICC line and need for 6-week course of IV antibiotics.  Assessment & Plan:   Principal Problem:   Acute metabolic encephalopathy Active Problems:   Obstructive sleep apnea of adult   Hypertension, essential   Atrial fibrillation (HCC)   Cirrhosis (HCC)   Thrombocytopenia   Hypokalemia   CHF (congestive heart failure) (HCC)   Esophageal varices (HCC)   Paroxysmal atrial fibrillation with RVR (HCC)   Hepatic encephalopathy (HCC)   Gram-positive bacteremia   Staphylococcus aureus bacteremia  Assessment and Plan:   Acute metabolic encephalopathy/hepatic encephalopathy - multifactorial including hepatic encephalopathy, severe hypothyroidism, Wernicke's/Korsakoff enceph - remains encephalopathic, intermittently agitated - Repeat ammonia--38>>51>>45 - B12--2954 - TSH-45.347 - folate 15.0 - Urine drug screen--neg - UA--no pyuria - CT brain--neg - Continue lactulose --increase dose to 30 g TID - 09/26/2023 ABG 7.41/51/73/32 - Minimize hypnotic medications -finished high dose thiamine  x 6 doses - more interactive but remains encephalopathic - The patient  does not have capacity to make decisions regarding his person and medical care -TOC consulted--assisted in obtaining emergency guardianship from social services -Sitter currently at bedside    MSSA Bacteremia -9/21 blood culture>> MSSA 2/2 -started cefazolin  IV -repeat blood culture 9/23 negative thus far -Plan to place PICC line by 9/25 if blood cultures remain negative with anticipated 6-week course of Ancef  -Patient cannot have TEE due to concerns of varices as well as thrombocytopenia   Paroxysmal atrial fibrillation with RVR-improved - continue Lopressor  IV while the patient is somnolent - 9/19 started diltiazem  drip>>transition to po 9/21 - Not a candidate for anticoagulation secondary to liver cirrhosis, thrombocytopenia, and poor compliance - 9/23--transition to long-acting diltiazem  CD and oral metoprolol    Hypothyroidism/Myxedema -TSH 45.347, Free T4 0.61, Free T3 = 1.8 -start low dose synthroid  IV and titrate slowly up - increase synthroid  IV to 50 mcg daily -Plan to transition to oral Synthroid  today   Relative adrenal insufficiency -cortrosyn  stimulation 11.8>>14.4>>15.6 -started solucortef -Transition to oral Cortef    Chronic HFpEF - 08/20/2023 echo EF 55 to 60%, no WMA, normal RVF, difficult study secondary to patient's body habitus   History of alcohol abuse/polysubstance abuse - Urine drug screen--neg   Alcoholic liver cirrhosis - Patient has history of esophageal varices and mesenteric vein thrombosis - now fluid overloaded>>given lasix  IV x 1 9/21   Hypokalemia - Replete - mag 2.1   Thrombocytopenia - Secondary to liver cirrhosis - Monitor for signs of bleeding -Follow CBC with downward trend noted   Morbid obesity - BMI 46.94 - Lifestyle modification   Goals of Care -consult palliative -- The patient does not have capacity to make decisions regarding his person and medical care -TOC consulted--assisted in obtaining emergency guardianship from  social services     DVT prophylaxis: SCDs Code Status: Full  Family Communication: None, sitter at bedside Disposition Plan:  Status is: Inpatient Remains inpatient appropriate because: Need for IV medications.   Consultants:  Discussed with cardiology ID  Procedures:  None  Antimicrobials:  Anti-infectives (From admission, onward)    Start     Dose/Rate Route Frequency Ordered Stop   10/01/23 0245  vancomycin  (VANCOREADY) IVPB 1500 mg/300 mL  Status:  Discontinued       Placed in Followed by Linked Group   1,500 mg 150 mL/hr over 120 Minutes Intravenous Every 12 hours 09/30/23 1349 09/30/23 1448   09/30/23 1530  ceFAZolin  (ANCEF ) IVPB 2g/100 mL premix        2 g 200 mL/hr over 30 Minutes Intravenous Every 8 hours 09/30/23 1448     09/30/23 1445  vancomycin  (VANCOREADY) IVPB 2000 mg/400 mL  Status:  Discontinued       Placed in Followed by Linked Group   2,000 mg 200 mL/hr over 120 Minutes Intravenous  Once 09/30/23 1349 09/30/23 1448       Subjective: Patient seen and evaluated today with no new acute complaints or concerns. No acute concerns or events noted overnight.  Objective: Vitals:   10/01/23 2100 10/01/23 2158 10/02/23 0454 10/02/23 0827  BP:  125/75 121/87   Pulse:  65 76   Resp: 18 18    Temp:   97.9 F (36.6 C)   TempSrc:   Oral   SpO2:   97% 96%  Weight:      Height:        Intake/Output Summary (Last 24 hours) at 10/02/2023 1215 Last data filed at 10/01/2023 2208 Gross per 24 hour  Intake 720 ml  Output 402 ml  Net 318 ml   Filed Weights   09/25/23 1417 09/25/23 1939 09/27/23 1212  Weight: (!) 172.4 kg (!) 157 kg (!) 159.9 kg    Examination:  General exam: Appears calm and comfortable, pleasantly confused Respiratory system: Clear to auscultation. Respiratory effort normal.  1 L nasal cannula oxygen  Cardiovascular system: S1 & S2 heard, RRR.  Gastrointestinal system: Abdomen is soft Central nervous system: Alert and  awake Extremities: No edema Skin: No significant lesions noted Psychiatry: Flat affect.    Data Reviewed: I have personally reviewed following labs and imaging studies  CBC: Recent Labs  Lab 09/27/23 0453 09/29/23 0521 09/30/23 0715 10/01/23 0405 10/02/23 0846  WBC 4.5 6.7 6.2 6.0 5.5  NEUTROABS 3.2  --   --   --   --   HGB 10.7* 10.4* 10.6* 11.2* 11.5*  HCT 33.1* 32.4* 31.5* 33.9* 34.6*  MCV 93.2 92.6 92.1 93.1 93.0  PLT 55* 54* 54* 55* 46*   Basic Metabolic Panel: Recent Labs  Lab 09/26/23 0335 09/26/23 1313 09/28/23 0409 09/29/23 0521 09/30/23 0715 10/01/23 0405 10/01/23 1000 10/02/23 0846  NA 134*   < > 136 136 139 138  --  133*  K 2.9*   < > 3.5 2.9* 2.5* 2.6*  --  3.1*  CL 97*   < > 101 103 105 103  --  101  CO2 28   < > 25 24 22 24   --  23  GLUCOSE 104*   < > 106* 151* 130* 141*  --  125*  BUN 12   < > 9 12 13 14   --  16  CREATININE 0.73   < > 0.84 1.08 1.00 1.00  --  1.08  CALCIUM 7.5*   < > 8.1* 8.2* 8.3* 8.4*  --  8.6*  MG 1.8   < > 2.0 2.1 2.1  --  2.2 2.1  PHOS 3.2  --   --   --   --   --   --   --    < > = values in this interval not displayed.   GFR: Estimated Creatinine Clearance: 113.7 mL/min (by C-G formula based on SCr of 1.08 mg/dL). Liver Function Tests: Recent Labs  Lab 09/28/23 0409 09/29/23 0521 09/30/23 0715 10/01/23 0405 10/02/23 0846  AST 55* 47* 47* 52* 59*  ALT 23 23 23 24 21   ALKPHOS 90 80 82 97 95  BILITOT 4.1* 3.9* 3.1* 2.9* 2.7*  PROT 7.0 7.0 6.8 7.3 7.0  ALBUMIN  2.2* 2.6* 2.5* 2.6* 2.5*   No results for input(s): LIPASE, AMYLASE in the last 168 hours. Recent Labs  Lab 09/26/23 1313 09/27/23 0453 09/28/23 0409 09/29/23 0521 10/01/23 0402  AMMONIA 39* 38* 51* 45* 22   Coagulation Profile: No results for input(s): INR, PROTIME in the last 168 hours. Cardiac Enzymes: No results for input(s): CKTOTAL, CKMB, CKMBINDEX, TROPONINI in the last 168 hours. BNP (last 3 results) No results for input(s):  PROBNP in the last 8760 hours. HbA1C: No results for input(s): HGBA1C in the last 72 hours. CBG: Recent Labs  Lab 09/25/23 1428  GLUCAP 121*   Lipid Profile: No results for input(s): CHOL, HDL, LDLCALC, TRIG, CHOLHDL, LDLDIRECT in the last 72 hours. Thyroid  Function Tests: No results for input(s): TSH, T4TOTAL, FREET4, T3FREE, THYROIDAB in the last 72 hours. Anemia Panel: No results for input(s): VITAMINB12, FOLATE, FERRITIN, TIBC, IRON , RETICCTPCT in the last 72 hours. Sepsis Labs: Recent Labs  Lab 09/29/23 0853  PROCALCITON <0.10    Recent Results (from the past 240 hours)  Resp panel by RT-PCR (RSV, Flu A&B, Covid) Anterior Nasal Swab     Status: None   Collection Time: 09/26/23  8:30 AM   Specimen: Anterior Nasal Swab  Result Value Ref Range Status   SARS Coronavirus 2 by RT PCR NEGATIVE NEGATIVE Final    Comment: (NOTE) SARS-CoV-2 target nucleic acids are NOT DETECTED.  The SARS-CoV-2 RNA is generally detectable in upper respiratory specimens during the acute phase of infection. The lowest concentration of SARS-CoV-2 viral copies this assay can detect is 138 copies/mL. A negative result does not preclude SARS-Cov-2 infection and should not be used as the sole basis for treatment or other patient management decisions. A negative result may occur with  improper specimen collection/handling, submission of specimen other than nasopharyngeal swab, presence of viral mutation(s) within the areas targeted by this assay, and inadequate number of viral copies(<138 copies/mL). A negative result must be combined with clinical observations, patient history, and epidemiological information. The expected result is Negative.  Fact Sheet for Patients:  BloggerCourse.com  Fact Sheet for Healthcare Providers:  SeriousBroker.it  This test is no t yet approved or cleared by the [place]  FDA  and  has been authorized for detection and/or diagnosis of SARS-CoV-2 by FDA under an Emergency Use Authorization (EUA). This EUA will remain  in effect (meaning this test can be used) for the duration of the COVID-19 declaration under Section 564(b)(1) of the Act, 21 U.S.C.section 360bbb-3(b)(1), unless the authorization is terminated  or revoked sooner.       Influenza A by PCR NEGATIVE NEGATIVE Final   Influenza B by PCR NEGATIVE NEGATIVE Final    Comment: (NOTE) The Xpert Xpress SARS-CoV-2/FLU/RSV plus assay is intended as an aid in the diagnosis  of influenza from Nasopharyngeal swab specimens and should not be used as a sole basis for treatment. Nasal washings and aspirates are unacceptable for Xpert Xpress SARS-CoV-2/FLU/RSV testing.  Fact Sheet for Patients: BloggerCourse.com  Fact Sheet for Healthcare Providers: SeriousBroker.it  This test is not yet approved or cleared by the [place]  FDA and has been authorized for detection and/or diagnosis of SARS-CoV-2 by FDA under an Emergency Use Authorization (EUA). This EUA will remain in effect (meaning this test can be used) for the duration of the COVID-19 declaration under Section 564(b)(1) of the Act, 21 U.S.C. section 360bbb-3(b)(1), unless the authorization is terminated or revoked.     Resp Syncytial Virus by PCR NEGATIVE NEGATIVE Final    Comment: (NOTE) Fact Sheet for Patients: BloggerCourse.com  Fact Sheet for Healthcare Providers: SeriousBroker.it  This test is not yet approved or cleared by the [place]  FDA and has been authorized for detection and/or diagnosis of SARS-CoV-2 by FDA under an Emergency Use Authorization (EUA). This EUA will remain in effect (meaning this test can be used) for the duration of the COVID-19 declaration under Section 564(b)(1) of the Act, 21 U.S.C. section 360bbb-3(b)(1),  unless the authorization is terminated or revoked.  Performed at [facility], [address] [place], [place] [address]   Respiratory (~20 pathogens) panel by PCR     Status: None   Collection Time: 09/26/23  8:30 AM   Specimen: Nasopharyngeal Swab; Respiratory  Result Value Ref Range Status   Adenovirus NOT DETECTED NOT DETECTED Final   Coronavirus 229E NOT DETECTED NOT DETECTED Final    Comment: (NOTE) The Coronavirus on the Respiratory Panel, DOES NOT test for the novel  Coronavirus (2019 nCoV)    Coronavirus HKU1 NOT DETECTED NOT DETECTED Final   Coronavirus NL63 NOT DETECTED NOT DETECTED Final   Coronavirus OC43 NOT DETECTED NOT DETECTED Final   Metapneumovirus NOT DETECTED NOT DETECTED Final   Rhinovirus / Enterovirus NOT DETECTED NOT DETECTED Final   Influenza A NOT DETECTED NOT DETECTED Final   Influenza B NOT DETECTED NOT DETECTED Final   Parainfluenza Virus 1 NOT DETECTED NOT DETECTED Final   Parainfluenza Virus 2 NOT DETECTED NOT DETECTED Final   Parainfluenza Virus 3 NOT DETECTED NOT DETECTED Final   Parainfluenza Virus 4 NOT DETECTED NOT DETECTED Final   Respiratory Syncytial Virus NOT DETECTED NOT DETECTED Final   Bordetella pertussis NOT DETECTED NOT DETECTED Final   Bordetella Parapertussis NOT DETECTED NOT DETECTED Final   Chlamydophila pneumoniae NOT DETECTED NOT DETECTED Final   Mycoplasma pneumoniae NOT DETECTED NOT DETECTED Final    Comment: Performed at [facility], [address], [place], [place] [address]  MRSA Next Gen by PCR, Nasal     Status: None   Collection Time: 09/27/23 11:59 AM   Specimen: Nasal Mucosa; Nasal Swab  Result Value Ref Range Status   MRSA by PCR Next Gen NOT DETECTED NOT DETECTED Final    Comment: (NOTE) The GeneXpert MRSA Assay (FDA approved for NASAL specimens only), is one component of a comprehensive MRSA colonization surveillance program. It is not intended to diagnose MRSA infection nor to guide or monitor treatment  for MRSA infections. Test performance is not FDA approved in patients less than 71 years old. Performed at [facility], [address], [place], [place] [address]   Culture, blood (Routine X 2) w Reflex to ID Panel     Status: Abnormal   Collection Time: 09/29/23 12:35 PM   Specimen: BLOOD LEFT  HAND  Result Value Ref Range Status   Specimen Description   Final    BLOOD LEFT HAND BOTTLES DRAWN AEROBIC AND ANAEROBIC Performed at [facility], [address] [place], [place] [address]    Special Requests   Final    Blood Culture adequate volume Performed at [facility], [address], [place], [place] [address]    Culture  Setup Time   Final    IN BOTH AEROBIC AND ANAEROBIC BOTTLES GRAM POSITIVE COCCI Gram Stain Report Called to,Read Back By and Verified With: A RAY RN9/22/25 @1006  BY J. WHITE  GRAM STAIN REVIEWED-AGREE WITH RESULT DRT    Culture STAPHYLOCOCCUS AUREUS (A)  Final   Report Status 10/02/2023 FINAL  Final   Organism ID, Bacteria STAPHYLOCOCCUS AUREUS  Final      Susceptibility   Staphylococcus aureus - MIC*    CIPROFLOXACIN  <=0.5 SENSITIVE Sensitive     ERYTHROMYCIN >=8 RESISTANT Resistant     GENTAMICIN <=0.5 SENSITIVE Sensitive     OXACILLIN <=0.25 SENSITIVE Sensitive     TETRACYCLINE <=1 SENSITIVE Sensitive     VANCOMYCIN  <=0.5 SENSITIVE Sensitive     TRIMETH/SULFA <=10 SENSITIVE Sensitive     CLINDAMYCIN RESISTANT Resistant     RIFAMPIN <=0.5 SENSITIVE Sensitive     Inducible Clindamycin POSITIVE Resistant     LINEZOLID 2 SENSITIVE Sensitive     * STAPHYLOCOCCUS AUREUS  Blood Culture ID Panel (Reflexed)     Status: Abnormal   Collection Time: 09/29/23 12:35 PM  Result Value Ref Range Status   Enterococcus faecalis NOT DETECTED NOT DETECTED Final   Enterococcus Faecium NOT DETECTED NOT DETECTED Final   Listeria monocytogenes NOT DETECTED NOT DETECTED Final   Staphylococcus species DETECTED (A) NOT DETECTED Final    Comment: CRITICAL RESULT CALLED TO, READ  BACK BY AND VERIFIED WITH: PHARMD LORI POOLE ON 09/30/23 @ 1430 BY DRT    Staphylococcus aureus (BCID) DETECTED (A) NOT DETECTED Final    Comment: CRITICAL RESULT CALLED TO, READ BACK BY AND VERIFIED WITH: PHARMD LORI POOLE ON 09/30/23 @ 1430 BY DRT    Staphylococcus epidermidis NOT DETECTED NOT DETECTED Final   Staphylococcus lugdunensis NOT DETECTED NOT DETECTED Final   Streptococcus species NOT DETECTED NOT DETECTED Final   Streptococcus agalactiae NOT DETECTED NOT DETECTED Final   Streptococcus pneumoniae NOT DETECTED NOT DETECTED Final   Streptococcus pyogenes NOT DETECTED NOT DETECTED Final   A.calcoaceticus-baumannii NOT DETECTED NOT DETECTED Final   Bacteroides fragilis NOT DETECTED NOT DETECTED Final   Enterobacterales NOT DETECTED NOT DETECTED Final   Enterobacter cloacae complex NOT DETECTED NOT DETECTED Final   Escherichia coli NOT DETECTED NOT DETECTED Final   Klebsiella aerogenes NOT DETECTED NOT DETECTED Final   Klebsiella oxytoca NOT DETECTED NOT DETECTED Final   Klebsiella pneumoniae NOT DETECTED NOT DETECTED Final   Proteus species NOT DETECTED NOT DETECTED Final   Salmonella species NOT DETECTED NOT DETECTED Final   Serratia marcescens NOT DETECTED NOT DETECTED Final   Haemophilus influenzae NOT DETECTED NOT DETECTED Final   Neisseria meningitidis NOT DETECTED NOT DETECTED Final   Pseudomonas aeruginosa NOT DETECTED NOT DETECTED Final   Stenotrophomonas maltophilia NOT DETECTED NOT DETECTED Final   Candida albicans NOT DETECTED NOT DETECTED Final   Candida auris NOT DETECTED NOT DETECTED Final   Candida glabrata NOT DETECTED NOT DETECTED Final   Candida krusei NOT DETECTED NOT DETECTED Final   Candida parapsilosis NOT DETECTED NOT DETECTED Final   Candida tropicalis NOT DETECTED NOT DETECTED  Final   Cryptococcus neoformans/gattii NOT DETECTED NOT DETECTED Final   Meth resistant mecA/C and MREJ NOT DETECTED NOT DETECTED Final    Comment: Performed at [facility], [address], [place], [place] [address]  Culture, blood (Routine X 2) w Reflex to ID Panel     Status: Abnormal   Collection Time: 09/29/23 12:50 PM   Specimen: BLOOD RIGHT HAND  Result Value Ref Range Status   Specimen Description   Final    BLOOD RIGHT HAND BOTTLES DRAWN AEROBIC AND ANAEROBIC Performed at [facility], [address] [place], [place] [address]    Special Requests   Final    Blood Culture adequate volume Performed at [facility], [address], [place], [place] [address]    Culture  Setup Time   Final    AEROBIC BOTTLE ONLY GRAM POSITIVE COCCI Gram Stain Report Called to,Read Back By and Verified With: A RAY RN 09/30/23 @1010  BY J. WHITE GRAM STAIN REVIEWED-AGREE WITH RESULT DRT    Culture (A)  Final    STAPHYLOCOCCUS AUREUS SUSCEPTIBILITIES PERFORMED ON PREVIOUS CULTURE WITHIN THE LAST 5 DAYS. Performed at [facility], [address], [place], [place] [address]    Report Status 10/02/2023 FINAL  Final  Culture, blood (Routine X 2) w Reflex to ID Panel     Status: None (Preliminary result)   Collection Time: 10/01/23  4:02 AM   Specimen: BLOOD LEFT ARM  Result Value Ref Range Status   Specimen Description BLOOD LEFT ARM  Final   Special Requests   Final    BOTTLES DRAWN AEROBIC AND ANAEROBIC Blood Culture adequate volume   Culture   Final    NO GROWTH 1 DAY Performed at [facility], [address] [place], [place] [address]    Report Status PENDING  Incomplete  Culture, blood (Routine X 2) w Reflex to ID Panel     Status: None (Preliminary result)   Collection Time: 10/01/23  4:05 AM   Specimen: BLOOD LEFT HAND  Result Value Ref Range Status   Specimen Description BLOOD LEFT HAND  Final   Special Requests   Final    BOTTLES DRAWN AEROBIC AND ANAEROBIC Blood Culture adequate volume   Culture   Final    NO GROWTH 1 DAY Performed at [facility], [address], [place], [place] [address]    Report Status PENDING  Incomplete          Radiology Studies: ECHOCARDIOGRAM COMPLETE Result Date: 10/02/2023    ECHOCARDIOGRAM REPORT   Patient Name:   SAVEON PLANT Huxford Date of Exam: 10/01/2023 Medical Rec #:  [id number]        Height:       72.0 in Accession #:    [id number]       Weight:       352.5 lb Date of Birth:  [birth year]        BSA:          2.711 m Patient Age:    58 years         BP:           145/103 mmHg Patient Gender: M                HR:           75 bpm. Exam Location:  [facility] Procedure: 2D Echo, Cardiac Doppler, Color Doppler and Intracardiac  Opacification Agent (Both Spectral and Color Flow Doppler were            utilized during procedure). Indications:     Bacteremia R78.81  History:         Patient has prior history of Echocardiogram examinations, most                  recent 04/22/2019. CHF, Arrythmias:Atrial Fibrillation; Risk                  Factors:Hypertension, Diabetes, Dyslipidemia, Non-Smoker and                  Sleep Apnea. Hx of ETOH abuse and morbid obesity.  Sonographer:     Aida Pizza RCS Referring Phys:  [phone] DAVID TAT Diagnosing Phys: Jayson Sierras MD  Sonographer Comments: Suboptimal apical window, suboptimal subcostal window and Technically difficult study due to poor echo windows. Image acquisition challenging due to patient body habitus. IMPRESSIONS  1. Images are limited.  2. Left ventricular ejection fraction, by estimation, is 70 to 75%. The left ventricle has hyperdynamic function. The left ventricle has no regional wall motion abnormalities. There is moderate asymmetric left ventricular hypertrophy of the basal segment. Left ventricular diastolic parameters are indeterminate.  3. Right ventricular systolic function is normal. The right ventricular size is mildly enlarged. Tricuspid regurgitation signal is inadequate for assessing PA pressure.  4. Left atrial size was severely dilated.  5. The mitral valve is grossly normal. Trivial mitral valve regurgitation.  6. The aortic  valve is tricuspid. Aortic valve regurgitation is trivial. Probable lambl's excrescences at leafet tips.  7. Aortic dilatation noted. There is borderline dilatation of the aortic root, measuring 40 mm.  8. Unable to estimate CVP. Comparison(s): Prior images reviewed side by side. No diagnostic valvular vegetations, consider TEE if high suspicion of endocarditis and otherwise clinically appropriate risk. FINDINGS  Left Ventricle: Left ventricular ejection fraction, by estimation, is 70 to 75%. The left ventricle has hyperdynamic function. The left ventricle has no regional wall motion abnormalities. Definity  contrast agent was given IV to delineate the left ventricular endocardial borders. The left ventricular internal cavity size was normal in size. There is moderate asymmetric left ventricular hypertrophy of the basal segment. Left ventricular diastolic function could not be evaluated due to atrial fibrillation. Left ventricular diastolic parameters are indeterminate. Right Ventricle: The right ventricular size is mildly enlarged. No increase in right ventricular wall thickness. Right ventricular systolic function is normal. Tricuspid regurgitation signal is inadequate for assessing PA pressure. Left Atrium: Left atrial size was severely dilated. Right Atrium: Right atrial size was normal in size. Pericardium: There is no evidence of pericardial effusion. Presence of epicardial fat layer. Mitral Valve: The mitral valve is grossly normal. Trivial mitral valve regurgitation. Tricuspid Valve: The tricuspid valve is grossly normal. Tricuspid valve regurgitation is trivial. Aortic Valve: The aortic valve is tricuspid. Aortic valve regurgitation is trivial. Pulmonic Valve: The pulmonic valve was grossly normal. Pulmonic valve regurgitation is trivial. Aorta: Aortic dilatation noted. There is borderline dilatation of the aortic root, measuring 40 mm. Venous: Unable to estimate CVP. IAS/Shunts: No atrial level shunt  detected by color flow Doppler. Additional Comments: 3D was performed not requiring image post processing on an independent workstation and was indeterminate.  LEFT VENTRICLE PLAX 2D LVIDd:         5.30 cm   Diastology LVIDs:         2.30 cm   LV e' medial:  9.57 cm/s LV PW:         1.40 cm   LV E/e' medial:  9.2 LV IVS:        1.20 cm   LV e' lateral:   9.32 cm/s LVOT diam:     1.90 cm   LV E/e' lateral: 9.5 LV SV:         48 LV SV Index:   18 LVOT Area:     2.84 cm  RIGHT VENTRICLE RV S prime:     18.00 cm/s TAPSE (M-mode): 1.6 cm LEFT ATRIUM              Index        RIGHT ATRIUM           Index LA diam:        5.30 cm  1.96 cm/m   RA Area:     20.00 cm LA Vol (A2C):   116.0 ml 42.79 ml/m  RA Volume:   59.40 ml  21.91 ml/m LA Vol (A4C):   193.0 ml 71.20 ml/m LA Biplane Vol: 163.0 ml 60.13 ml/m  AORTIC VALVE LVOT Vmax:   82.90 cm/s LVOT Vmean:  51.850 cm/s LVOT VTI:    0.168 m  AORTA Ao Root diam: 4.00 cm MITRAL VALVE MV Area (PHT): 2.24 cm    SHUNTS MV Decel Time: 338 msec    Systemic VTI:  0.17 m MV E velocity: 88.20 cm/s  Systemic Diam: 1.90 cm Jayson Sierras MD Electronically signed by Jayson Sierras MD Signature Date/Time: 10/01/2023/3:24:46 PM    Final (Updated)         Scheduled Meds:  Chlorhexidine  Gluconate Cloth  6 each Topical Q0600   diltiazem   240 mg Oral Daily   [START ON 10/03/2023] hydrocortisone   20 mg Oral BID   lactulose   30 g Oral TID   levalbuterol   0.63 mg Nebulization BID   [START ON 10/03/2023] levothyroxine   125 mcg Oral Q0600   magic mouthwash  10 mL Oral TID   metoprolol  tartrate  50 mg Oral BID   nystatin    Topical BID   potassium chloride   40 mEq Oral Q4H   thiamine   100 mg Oral Daily   Continuous Infusions:   ceFAZolin  (ANCEF ) IV 2 g (10/02/23 0605)     LOS: 6 days    Time spent: 55 minutes    Shirleyann Montero JONETTA Fairly, DO [facility]  If 7PM-7AM, please contact night-coverage [email or web address] 10/02/2023, 12:15 PM

## 2023-10-02 NOTE — Plan of Care (Signed)
 Problem: Acute Rehab PT Goals(only PT should resolve) Goal: Pt Will Go Supine/Side To Sit Outcome: Progressing Flowsheets (Taken 10/02/2023 1436) Pt will go Supine/Side to Sit: with minimal assist Goal: Patient Will Transfer Sit To/From Stand Outcome: Progressing Flowsheets (Taken 10/02/2023 1436) Patient will transfer sit to/from stand: with contact guard assist Goal: Pt Will Transfer Bed To Chair/Chair To Bed Outcome: Progressing Flowsheets (Taken 10/02/2023 1436) Pt will Transfer Bed to Chair/Chair to Bed:  with min assist  with contact guard assist Goal: Pt Will Ambulate Outcome: Progressing Flowsheets (Taken 10/02/2023 1436) Pt will Ambulate:  25 feet  with minimal assist  with moderate assist  with rolling walker

## 2023-10-02 NOTE — Progress Notes (Signed)
 Pt expressed a Type 7 medium brown stool. Pt was more alert, requesting to drink water . Stated he feels a little better.

## 2023-10-03 ENCOUNTER — Other Ambulatory Visit: Payer: Self-pay

## 2023-10-03 DIAGNOSIS — G9341 Metabolic encephalopathy: Secondary | ICD-10-CM | POA: Diagnosis not present

## 2023-10-03 LAB — CBC
HCT: 33.8 % — ABNORMAL LOW (ref 39.0–52.0)
Hemoglobin: 11.4 g/dL — ABNORMAL LOW (ref 13.0–17.0)
MCH: 31 pg (ref 26.0–34.0)
MCHC: 33.7 g/dL (ref 30.0–36.0)
MCV: 91.8 fL (ref 80.0–100.0)
Platelets: 56 K/uL — ABNORMAL LOW (ref 150–400)
RBC: 3.68 MIL/uL — ABNORMAL LOW (ref 4.22–5.81)
RDW: 16 % — ABNORMAL HIGH (ref 11.5–15.5)
WBC: 6.1 K/uL (ref 4.0–10.5)
nRBC: 0 % (ref 0.0–0.2)

## 2023-10-03 LAB — BASIC METABOLIC PANEL WITH GFR
Anion gap: 7 (ref 5–15)
BUN: 18 mg/dL (ref 6–20)
CO2: 22 mmol/L (ref 22–32)
Calcium: 8.5 mg/dL — ABNORMAL LOW (ref 8.9–10.3)
Chloride: 101 mmol/L (ref 98–111)
Creatinine, Ser: 1.01 mg/dL (ref 0.61–1.24)
GFR, Estimated: >60 mL/min (ref >60)
Glucose, Bld: 133 mg/dL — ABNORMAL HIGH (ref 70–99)
Potassium: 3.1 mmol/L — ABNORMAL LOW (ref 3.5–5.1)
Sodium: 130 mmol/L — ABNORMAL LOW (ref 135–145)

## 2023-10-03 LAB — MAGNESIUM: Magnesium: 2.1 mg/dL (ref 1.7–2.4)

## 2023-10-03 MED ORDER — POTASSIUM CHLORIDE CRYS ER 20 MEQ PO TBCR
40.0000 meq | EXTENDED_RELEASE_TABLET | ORAL | Status: AC
  Administered 2023-10-03 (×2): 40 meq via ORAL
  Filled 2023-10-03 (×2): qty 2

## 2023-10-03 NOTE — Progress Notes (Signed)
 PHARMACY CONSULT NOTE FOR:  OUTPATIENT  PARENTERAL ANTIBIOTIC THERAPY (OPAT)  Indication: MSSA bacteremia Decompensated liver cirrhosis Regimen: ancef  2g every 8 hours End date: 11/12/23  IV antibiotic discharge orders are pended. To discharging provider:  please sign these orders via discharge navigator,  Select New Orders & click on the button choice - Manage This Unsigned Work.     Thank you for allowing pharmacy to be a part of this patient's care.  Dempsey Blush PharmD., BCPS Clinical Pharmacist 10/03/2023 11:30 AM

## 2023-10-03 NOTE — Plan of Care (Signed)
  Problem: Education: Goal: Knowledge of General Education information will improve Description: Including pain rating scale, medication(s)/side effects and non-pharmacologic comfort measures Outcome: Not Progressing Variance Physical/mental limitations Impact: Moderate   Problem: Safety: Goal: Ability to remain free from injury will improve Outcome: Progressing   Problem: Skin Integrity: Goal: Risk for impaired skin integrity will decrease Outcome: Progressing

## 2023-10-03 NOTE — Plan of Care (Signed)
  Problem: Acute Rehab OT Goals (only OT should resolve) Goal: Pt. Will Perform Grooming Flowsheets (Taken 10/03/2023 1517) Pt Will Perform Grooming:  sitting  with set-up Goal: Pt. Will Perform Upper Body Dressing Flowsheets (Taken 10/03/2023 1517) Pt Will Perform Upper Body Dressing:  with set-up  sitting Goal: Pt. Will Perform Lower Body Dressing Flowsheets (Taken 10/03/2023 1517) Pt Will Perform Lower Body Dressing:  with contact guard assist  with min assist  sitting/lateral leans Goal: Pt. Will Transfer To Toilet Flowsheets (Taken 10/03/2023 1517) Pt Will Transfer to Toilet:  with supervision  stand pivot transfer Goal: Pt. Will Perform Toileting-Clothing Manipulation Flowsheets (Taken 10/03/2023 1517) Pt Will Perform Toileting - Clothing Manipulation and hygiene:  with supervision  with contact guard assist  sitting/lateral leans Goal: Pt/Caregiver Will Perform Home Exercise Program Flowsheets (Taken 10/03/2023 1517) Pt/caregiver will Perform Home Exercise Program:  Increased strength  Increased ROM  Independently  Annison Birchard OT, MOT

## 2023-10-03 NOTE — Plan of Care (Signed)

## 2023-10-03 NOTE — Treatment Plan (Signed)
 Diagnosis: MSSA bacteremia Decompensated liver cirrhosis Baseline Creatinine <1    Allergies  Allergen Reactions   Allopurinol  Other (See Comments)    Joint pain worse Caused worsening gout   Fluoxetine Hcl Itching    OPAT Orders Discharge antibiotics: Cefazolin  2 grams IV q 8hrs 6 weeks End Date: 11/12/23  [facility] Care Per Protocol:  Labs weekly while on IV antibiotics: _X_ CBC with differential  _X_ CMP  _X_ Please pull PIC at completion of IV antibiotics   Fax weekly lab results  promptly to [phone] & [phone]  Clinic Follow Up Appt: my chart video visit 10/31/23 @ 9.30am   Call [phone] with any critical values or questions

## 2023-10-03 NOTE — Progress Notes (Signed)
 Physical Therapy Treatment Patient Details Name: [name] MRN: [id number] DOB: Jan 30, 1965 Today's Date: 10/03/2023   History of Present Illness [name] is a 58 y.o. male with medical history significant for diastolic CHF, asthma, atrial fibrillation, CHF, colic liver cirrhosis with esophageal varices and hepatic encephalopathy history.   Patient was brought to the ED from nursing home with reports of abnormal ammonia, and low potassium level.  At time of my evaluation, patient is awake and alert, is able to tell me his name but not much else.  He is able to talk but his speech is confused.  Per staff at nursing home, ammonia was initially in the 70s, he was given a dose of lactulose  and on repeat ammonia was 63, with altered mental status he was sent to the ED.     Patient was recently admitted at [facility], 9 days on discharged 9/12, was managed for hepatic encephalopathy, volume overload.  Admitting ammonia was greater than 100, and was 20 head CT was negative for acute abnormal.  Per discharge summary, he remained intermittently confused but was easily redirected with but no focal neurologic deficits.    PT Comments  Pt was agreeable to completing today's PT and OT co-treatment. Today's treatment was significantly limited by his cognitive deficits as he provided intermittent participation during today's session. He required significant multimodal cueing throughout today's treatment for proper sequencing with bed mobility and sit to stand transfers. He required modA with sit to stands and bed mobility with participation. However, maxA was required when pt did not participate despite cueing. He was able to achieve standing twice prior to having to sit due to fatigue. He was left in the bed with the call bell within reach and bed alarm set. Patient will benefit from continued skilled physical therapy in hospital and recommended venue below to increase strength, balance, endurance for  safe ADLs and gait.    If plan is discharge home, recommend the following: A lot of help with bathing/dressing/bathroom;Help with stairs or ramp for entrance;Assistance with cooking/housework;Assist for transportation;A lot of help with walking and/or transfers   Can travel by private vehicle     No  Equipment Recommendations  Rolling walker (2 wheels)    Recommendations for Other Services       Precautions / Restrictions Precautions Precautions: Fall Recall of Precautions/Restrictions: Impaired Restrictions Weight Bearing Restrictions Per Provider Order: No     Mobility  Bed Mobility Overal bed mobility: Needs Assistance Bed Mobility: Supine to Sit, Sit to Supine     Supine to sit: Mod assist, HOB elevated, Used rails     General bed mobility comments: required HOB fully raised for completing supine to sitting, has difficulty moving legs due to weakness    Transfers Overall transfer level: Needs assistance Equipment used: Rolling walker (2 wheels) Transfers: Sit to/from Stand Sit to Stand: Max assist, Mod assist           General transfer comment: modA with participation, but required maxA when pt was not participating    Ambulation/Gait                   Stairs             Wheelchair Mobility     Tilt Bed    Modified Rankin (Stroke Patients Only)       Balance Overall balance assessment: Needs assistance Sitting-balance support: Feet supported, No upper extremity supported Sitting balance-Leahy Scale: Fair Sitting balance - Comments:  seated EOB   Standing balance support: Reliant on assistive device for balance, During functional activity, Bilateral upper extremity supported Standing balance-Leahy Scale: Poor Standing balance comment: with RW                            Communication Communication Communication: Impaired Factors Affecting Communication: Difficulty expressing self  Cognition Arousal: Alert Behavior  During Therapy: Flat affect, Impulsive   PT - Cognitive impairments: History of cognitive impairments, No family/caregiver present to determine baseline                       PT - Cognition Comments: requires repeated verbal/tactile cueing for following directions Following commands: Impaired Following commands impaired: Follows one step commands inconsistently    Cueing Cueing Techniques: Verbal cues, Tactile cues, Gestural cues  Exercises      General Comments        Pertinent Vitals/Pain Pain Assessment Pain Assessment: Faces Faces Pain Scale: Hurts little more Pain Location: general Pain Descriptors / Indicators: Grimacing Pain Intervention(s): Limited activity within patient's tolerance, Monitored during session, Repositioned    Home Living                          Prior Function            PT Goals (current goals can now be found in the care plan section) Acute Rehab PT Goals Patient Stated Goal: return home PT Goal Formulation: With patient Time For Goal Achievement: 10/16/23 Potential to Achieve Goals: Good Progress towards PT goals: Progressing toward goals    Frequency    Min 3X/week      PT Plan      Co-evaluation PT/OT/SLP Co-Evaluation/Treatment: Yes Reason for Co-Treatment: To address functional/ADL transfers;Complexity of the patient's impairments (multi-system involvement) PT goals addressed during session: Mobility/safety with mobility;Balance        AM-PAC PT 6 Clicks Mobility   Outcome Measure  Help needed turning from your back to your side while in a flat bed without using bedrails?: A Lot Help needed moving from lying on your back to sitting on the side of a flat bed without using bedrails?: A Lot Help needed moving to and from a bed to a chair (including a wheelchair)?: A Little Help needed standing up from a chair using your arms (e.g., wheelchair or bedside chair)?: A Little Help needed to walk in hospital  room?: A Lot Help needed climbing 3-5 steps with a railing? : A Lot 6 Click Score: 14    End of Session Equipment Utilized During Treatment: Gait belt Activity Tolerance: Patient tolerated treatment well Patient left: in bed;with call bell/phone within reach;with bed alarm set   PT Visit Diagnosis: Muscle weakness (generalized) (M62.81);Unsteadiness on feet (R26.81);Other abnormalities of gait and mobility (R26.89)     Time: 8596-8572 PT Time Calculation (min) (ACUTE ONLY): 24 min  Charges:    $Therapeutic Activity: 8-22 mins PT General Charges $$ ACUTE PT VISIT: 1 Visit                     Lacinda Fass, PT, DPT  10/03/2023, 2:41 PM

## 2023-10-03 NOTE — Evaluation (Signed)
 Occupational Therapy Evaluation Patient Details Name: [name] MRN: [id number] DOB: Jul 12, 1965 Today's Date: 10/03/2023   History of Present Illness   [name] is a 58 y.o. male with medical history significant for diastolic CHF, asthma, atrial fibrillation, CHF, colic liver cirrhosis with esophageal varices and hepatic encephalopathy history.   Patient was brought to the ED from nursing home with reports of abnormal ammonia, and low potassium level.  At time of my evaluation, patient is awake and alert, is able to tell me his name but not much else.  He is able to talk but his speech is confused.  Per staff at nursing home, ammonia was initially in the 70s, he was given a dose of lactulose  and on repeat ammonia was 63, with altered mental status he was sent to the ED.     Patient was recently admitted at [facility], 9 days on discharged 9/12, was managed for hepatic encephalopathy, volume overload.  Admitting ammonia was greater than 100, and was 20 head CT was negative for acute abnormal.  Per discharge summary, he remained intermittently confused but was easily redirected with but no focal neurologic deficits.     Clinical Impressions Pt lethargic intermittently today. Pt clearly cognitively impaired with difficulty following one step commands. Pt also impulsive today. Pt required mod A for bed mobility overall. Pt able to complete sit to stands from EOB with mod A when he seemed to actively participate. Difficult to assess other ADL's and strength formally due to pt's cognition. Pt left in the bed with call bell within reach and bed alarm set. Pt will benefit from continued OT in the hospital to increase strength, balance, and endurance for safe ADL's.        If plan is discharge home, recommend the following:   A lot of help with bathing/dressing/bathroom;Assistance with cooking/housework;Direct supervision/assist for medications management;Assist for transportation;Help  with stairs or ramp for entrance;Supervision due to cognitive status;A lot of help with walking and/or transfers     Functional Status Assessment   Patient has had a recent decline in their functional status and demonstrates the ability to make significant improvements in function in a reasonable and predictable amount of time.     Equipment Recommendations   None recommended by OT            Precautions/Restrictions   Precautions Precautions: Fall Recall of Precautions/Restrictions: Impaired Restrictions Weight Bearing Restrictions Per Provider Order: No     Mobility Bed Mobility Overal bed mobility: Needs Assistance Bed Mobility: Supine to Sit, Sit to Supine     Supine to sit: Mod assist, HOB elevated, Used rails Sit to supine: Mod assist   General bed mobility comments: required HOB fully raised for completing supine to sitting, labored movement and assist to bring B LE into bed. Able to reach over head to assist with scooting to head of bed while supine. Pt also able to scoot to head of bed while seated with assist.    Transfers Overall transfer level: Needs assistance Equipment used: Rolling walker (2 wheels) Transfers: Sit to/from Stand Sit to Stand: Max assist, Mod assist           General transfer comment: Limited by cognition; mod A for ~x2 reps of sit to stand when pt was actively participating.      Balance Overall balance assessment: Needs assistance Sitting-balance support: Feet supported, No upper extremity supported Sitting balance-Leahy Scale: Fair Sitting balance - Comments: poor to fair seated at EOB  Standing balance support: Reliant on assistive device for balance, During functional activity, Bilateral upper extremity supported Standing balance-Leahy Scale: Poor Standing balance comment: with RW                           ADL either performed or assessed with clinical judgement   ADL Overall ADL's : Needs  assistance/impaired     Grooming: Moderate assistance;Minimal assistance;Sitting   Upper Body Bathing: Moderate assistance;Sitting   Lower Body Bathing: Maximal assistance;Total assistance;Sitting/lateral leans   Upper Body Dressing : Moderate assistance;Sitting   Lower Body Dressing: Maximal assistance;Total assistance;Sitting/lateral leans   Toilet Transfer: Moderate assistance;Maximal assistance;Rolling walker (2 wheels);Stand-pivot Statistician Details (indicate cue type and reason): Partially simulated via reps of sit to stand with RW. Toileting- Clothing Manipulation and Hygiene: Maximal assistance;Total assistance;Sitting/lateral lean;Bed level               Vision Baseline Vision/History: 1 Wears glasses (per chart) Vision Assessment?:  (Eyes closed intermittently throughout the session. Cognition limiting formal assesssment. No observed deficits.)     Perception Perception: Not tested       Praxis Praxis: Not tested       Pertinent Vitals/Pain Pain Assessment Pain Assessment: Faces Faces Pain Scale: Hurts little more Pain Location: general Pain Descriptors / Indicators: Grimacing, Sharp Pain Intervention(s): Limited activity within patient's tolerance, Monitored during session, Repositioned     Extremity/Trunk Assessment Upper Extremity Assessment Upper Extremity Assessment: Difficult to assess due to impaired cognition (Able to grasp RW and reach overhead while supine to pull bed rail to scoot to head of bed.)   Lower Extremity Assessment Lower Extremity Assessment: Defer to PT evaluation   Cervical / Trunk Assessment Cervical / Trunk Assessment: Kyphotic   Communication Communication Communication: Impaired Factors Affecting Communication: Difficulty expressing self   Cognition Arousal: Lethargic Behavior During Therapy: Impulsive Cognition: Cognition impaired           Executive functioning impairment (select all impairments):  Sequencing OT - Cognition Comments: Intermittently lethargic ; difficulty following commands. Impulsive.                 Following commands: Impaired Following commands impaired: Follows one step commands inconsistently     Cueing  General Comments   Cueing Techniques: Verbal cues;Tactile cues;Gestural cues                 Home Living Family/patient expects to be discharged to:: Private residence Living Arrangements: Non-relatives/Friends Available Help at Discharge: Family;Available PRN/intermittently Type of Home: House Home Access: Stairs to enter [employer] of Steps: 1 Entrance Stairs-Rails: None Home Layout: One level           Bathroom Accessibility: Yes   Home Equipment: Crutches;BSC/3in1;Shower seat;Grab bars - toilet   Additional Comments: Heavy Duty Axillary Crutches (Information per chart.)      Prior Functioning/Environment Prior Level of Function : Patient poor historian/Family not available             Mobility Comments: household and short distanced community ambulation with RW PRN ADLs Comments: Independent (information per chart)    OT Problem List: Decreased strength;Decreased range of motion;Decreased activity tolerance;Impaired balance (sitting and/or standing);Decreased cognition;Decreased safety awareness;Obesity   OT Treatment/Interventions: Self-care/ADL training;Therapeutic exercise;DME and/or AE instruction;Therapeutic activities;Cognitive remediation/compensation;Patient/family education;Balance training;Visual/perceptual remediation/compensation      OT Goals(Current goals can be found in the care plan section)   Acute Rehab OT Goals Patient Stated Goal: none stated OT Goal Formulation: Patient  unable to participate in goal setting Time For Goal Achievement: 10/17/23 Potential to Achieve Goals: Fair   OT Frequency:  Min 3X/week    Co-evaluation PT/OT/SLP Co-Evaluation/Treatment: Yes Reason for  Co-Treatment: To address functional/ADL transfers;Complexity of the patient's impairments (multi-system involvement) PT goals addressed during session: Mobility/safety with mobility;Balance OT goals addressed during session: ADL's and self-care                       End of Session Equipment Utilized During Treatment: Rolling walker (2 wheels);Gait belt  Activity Tolerance: Patient limited by lethargy;Other (comment) (Limited by level of cognition today.) Patient left: in bed;with call bell/phone within reach;with bed alarm set  OT Visit Diagnosis: Unsteadiness on feet (R26.81);Other abnormalities of gait and mobility (R26.89);Muscle weakness (generalized) (M62.81);Other symptoms and signs involving cognitive function                Time: 8595-8572 OT Time Calculation (min): 23 min Charges:  OT General Charges $OT Visit: 1 Visit OT Evaluation $OT Eval Low Complexity: 1 Low  Kammie Scioli OT, MOT  Jayson Person 10/03/2023, 3:14 PM

## 2023-10-03 NOTE — Progress Notes (Signed)
 PROGRESS NOTE    [name]  FMW:[id number] DOB: 1965-05-08 DOA: 09/25/2023 PCP: Carleen Dallas PARAS, FNP   Brief Narrative:    58 year old male with a history of HFpEF, paroxysmal atrial fibrillation, alcoholic liver cirrhosis, portal hypertension with esophageal varices, thrombocytopenia, COPD, OSA, portal/splenic/mesenteric vein thrombosis, chronic back pain obesity presented from [facility] for abnormal labs.  Patient was admitted with acute metabolic encephalopathy/hepatic encephalopathy and continues to remain encephalopathic, but is now more interactive.  He did not have capacity to make decisions and has emergency guardianship.  He was also noted to have MSSA bacteremia and cannot have TEE due to esophageal varices and issues with thrombocytopenia.  He will be treated for presumptive endocarditis with placement of PICC line and need for 6-week course of IV antibiotics.  Patient will now need to remain without sitter for approximately 48 hours before he can be discharged back to SNF.  Assessment & Plan:   Principal Problem:   Acute metabolic encephalopathy Active Problems:   Obstructive sleep apnea of adult   Hypertension, essential   Atrial fibrillation (HCC)   Cirrhosis (HCC)   Thrombocytopenia   Hypokalemia   CHF (congestive heart failure) (HCC)   Esophageal varices (HCC)   Paroxysmal atrial fibrillation with RVR (HCC)   Hepatic encephalopathy (HCC)   Gram-positive bacteremia   Staphylococcus aureus bacteremia  Assessment and Plan:   Acute metabolic encephalopathy/hepatic encephalopathy-stable - multifactorial including hepatic encephalopathy, severe hypothyroidism, Wernicke's/Korsakoff enceph - remains encephalopathic, intermittently agitated - Repeat ammonia--38>>51>>45 - B12--2954 - TSH-45.347 - folate 15.0 - Urine drug screen--neg - UA--no pyuria - CT brain--neg - Continue lactulose --increase dose to 30 g TID - 09/26/2023 ABG 7.41/51/73/32 - Minimize  hypnotic medications -finished high dose thiamine  x 6 doses - more interactive but remains encephalopathic - The patient does not have capacity to make decisions regarding his person and medical care -TOC consulted--assisted in obtaining emergency guardianship from social services -Doak has been discontinued    MSSA Bacteremia -9/21 blood culture>> MSSA 2/2 -started cefazolin  IV -repeat blood culture 9/23 negative thus far -Plan to place PICC today due to blood cultures remaining negative with anticipated 6-week course of Ancef  per ID with OPAT orders placed -Patient cannot have TEE due to concerns of varices as well as thrombocytopenia  Worsening hyponatremia -Likely in the setting of hypothyroidism as well as adrenal insufficiency -Noted to be somewhat volume overloaded at this time, but will not tolerate diuresis given soft blood pressure readings -Plan to limit oral fluid intake to 1200 mL and follow -May need to consider tolvaptan by a.m. if worsening   Paroxysmal atrial fibrillation with RVR-improved - continue Lopressor  IV while the patient is somnolent - 9/19 started diltiazem  drip>>transition to po 9/21 - Not a candidate for anticoagulation secondary to liver cirrhosis, thrombocytopenia, and poor compliance - 9/23--transition to long-acting diltiazem  CD and oral metoprolol    Hypothyroidism/Myxedema -TSH 45.347, Free T4 0.61, Free T3 = 1.8 -start low dose synthroid  IV and titrate slowly up - Continue now on oral Synthroid    Relative adrenal insufficiency -cortrosyn  stimulation 11.8>>14.4>>15.6 - Patient on oral Cortef    Chronic HFpEF - 08/20/2023 echo EF 55 to 60%, no WMA, normal RVF, difficult study secondary to patient's body habitus   History of alcohol abuse/polysubstance abuse - Urine drug screen--neg   Alcoholic liver cirrhosis - Patient has history of esophageal varices and mesenteric vein thrombosis - now fluid overloaded>>given lasix  IV x 1 9/21    Hypokalemia - Replete - mag 2.1   Thrombocytopenia -  Secondary to liver cirrhosis - Monitor for signs of bleeding -Follow CBC with downward trend noted   Morbid obesity - BMI 46.94 - Lifestyle modification   Goals of Care -consult palliative -- The patient does not have capacity to make decisions regarding his person and medical care -TOC consulted--assisted in obtaining emergency guardianship from social services     DVT prophylaxis: SCDs Code Status: Full Family Communication: None, sitter at bedside Disposition Plan:  Status is: Inpatient Remains inpatient appropriate because: Need for IV medications.   Consultants:  Discussed with cardiology ID  Procedures:  None  Antimicrobials:  Anti-infectives (From admission, onward)    Start     Dose/Rate Route Frequency Ordered Stop   10/01/23 0245  vancomycin  (VANCOREADY) IVPB 1500 mg/300 mL  Status:  Discontinued       Placed in Followed by Linked Group   1,500 mg 150 mL/hr over 120 Minutes Intravenous Every 12 hours 09/30/23 1349 09/30/23 1448   09/30/23 1530  ceFAZolin  (ANCEF ) IVPB 2g/100 mL premix        2 g 200 mL/hr over 30 Minutes Intravenous Every 8 hours 09/30/23 1448     09/30/23 1445  vancomycin  (VANCOREADY) IVPB 2000 mg/400 mL  Status:  Discontinued       Placed in Followed by Linked Group   2,000 mg 200 mL/hr over 120 Minutes Intravenous  Once 09/30/23 1349 09/30/23 1448       Subjective: Patient seen and evaluated today with no new acute complaints or concerns. No acute concerns or events noted overnight.  Patient did require some Ativan  in the very early morning hours as he was trying to get up out of bed.  He does not currently appear to require any sitter and therefore this has been discontinued.  He has tried to have some breakfast this morning.  Denies any back pain.  Objective: Vitals:   10/02/23 2015 10/03/23 0553 10/03/23 0753 10/03/23 0948  BP:  (!) 124/91  104/65  Pulse:  73  (!) 52   Resp:  16    Temp:  97.7 F (36.5 C)  97.7 F (36.5 C)  TempSrc:  Oral  Oral  SpO2: 95%  94% 96%  Weight:      Height:        Intake/Output Summary (Last 24 hours) at 10/03/2023 1113 Last data filed at 10/02/2023 1300 Gross per 24 hour  Intake 720 ml  Output 1 ml  Net 719 ml   Filed Weights   09/25/23 1417 09/25/23 1939 09/27/23 1212  Weight: (!) 172.4 kg (!) 157 kg (!) 159.9 kg    Examination:  General exam: Appears calm and comfortable, pleasantly confused, obese Respiratory system: Clear to auscultation. Respiratory effort normal.  1 L nasal cannula oxygen  Cardiovascular system: S1 & S2 heard, RRR.  Gastrointestinal system: Abdomen is soft, mildly distended Central nervous system: Alert and awake Extremities: No edema Skin: No significant lesions noted Psychiatry: Flat affect.    Data Reviewed: I have personally reviewed following labs and imaging studies  CBC: Recent Labs  Lab 09/27/23 0453 09/29/23 0521 09/30/23 0715 10/01/23 0405 10/02/23 0846 10/03/23 0417  WBC 4.5 6.7 6.2 6.0 5.5 6.1  NEUTROABS 3.2  --   --   --   --   --   HGB 10.7* 10.4* 10.6* 11.2* 11.5* 11.4*  HCT 33.1* 32.4* 31.5* 33.9* 34.6* 33.8*  MCV 93.2 92.6 92.1 93.1 93.0 91.8  PLT 55* 54* 54* 55* 46* 56*   Basic Metabolic Panel:  Recent Labs  Lab 09/29/23 0521 09/30/23 0715 10/01/23 0405 10/01/23 1000 10/02/23 0846 10/03/23 0417  NA 136 139 138  --  133* 130*  K 2.9* 2.5* 2.6*  --  3.1* 3.1*  CL 103 105 103  --  101 101  CO2 24 22 24   --  23 22  GLUCOSE 151* 130* 141*  --  125* 133*  BUN 12 13 14   --  16 18  CREATININE 1.08 1.00 1.00  --  1.08 1.01  CALCIUM 8.2* 8.3* 8.4*  --  8.6* 8.5*  MG 2.1 2.1  --  2.2 2.1 2.1   GFR: Estimated Creatinine Clearance: 121.6 mL/min (by C-G formula based on SCr of 1.01 mg/dL). Liver Function Tests: Recent Labs  Lab 09/28/23 0409 09/29/23 0521 09/30/23 0715 10/01/23 0405 10/02/23 0846  AST 55* 47* 47* 52* 59*  ALT 23 23 23 24 21    ALKPHOS 90 80 82 97 95  BILITOT 4.1* 3.9* 3.1* 2.9* 2.7*  PROT 7.0 7.0 6.8 7.3 7.0  ALBUMIN  2.2* 2.6* 2.5* 2.6* 2.5*   No results for input(s): LIPASE, AMYLASE in the last 168 hours. Recent Labs  Lab 09/26/23 1313 09/27/23 0453 09/28/23 0409 09/29/23 0521 10/01/23 0402  AMMONIA 39* 38* 51* 45* 22   Coagulation Profile: No results for input(s): INR, PROTIME in the last 168 hours. Cardiac Enzymes: No results for input(s): CKTOTAL, CKMB, CKMBINDEX, TROPONINI in the last 168 hours. BNP (last 3 results) No results for input(s): PROBNP in the last 8760 hours. HbA1C: No results for input(s): HGBA1C in the last 72 hours. CBG: No results for input(s): GLUCAP in the last 168 hours.  Lipid Profile: No results for input(s): CHOL, HDL, LDLCALC, TRIG, CHOLHDL, LDLDIRECT in the last 72 hours. Thyroid  Function Tests: No results for input(s): TSH, T4TOTAL, FREET4, T3FREE, THYROIDAB in the last 72 hours. Anemia Panel: No results for input(s): VITAMINB12, FOLATE, FERRITIN, TIBC, IRON , RETICCTPCT in the last 72 hours. Sepsis Labs: Recent Labs  Lab 09/29/23 0853  PROCALCITON <0.10    Recent Results (from the past 240 hours)  Resp panel by RT-PCR (RSV, Flu A&B, Covid) Anterior Nasal Swab     Status: None   Collection Time: 09/26/23  8:30 AM   Specimen: Anterior Nasal Swab  Result Value Ref Range Status   SARS Coronavirus 2 by RT PCR NEGATIVE NEGATIVE Final    Comment: (NOTE) SARS-CoV-2 target nucleic acids are NOT DETECTED.  The SARS-CoV-2 RNA is generally detectable in upper respiratory specimens during the acute phase of infection. The lowest concentration of SARS-CoV-2 viral copies this assay can detect is 138 copies/mL. A negative result does not preclude SARS-Cov-2 infection and should not be used as the sole basis for treatment or other patient management decisions. A negative result may occur with  improper specimen  collection/handling, submission of specimen other than nasopharyngeal swab, presence of viral mutation(s) within the areas targeted by this assay, and inadequate number of viral copies(<138 copies/mL). A negative result must be combined with clinical observations, patient history, and epidemiological information. The expected result is Negative.  Fact Sheet for Patients:  BloggerCourse.com  Fact Sheet for Healthcare Providers:  SeriousBroker.it  This test is no t yet approved or cleared by the [place]  FDA and  has been authorized for detection and/or diagnosis of SARS-CoV-2 by FDA under an Emergency Use Authorization (EUA). This EUA will remain  in effect (meaning this test can be used) for the duration of the COVID-19 declaration under Section 564(b)(1) of  the Act, 21 U.S.C.section 360bbb-3(b)(1), unless the authorization is terminated  or revoked sooner.       Influenza A by PCR NEGATIVE NEGATIVE Final   Influenza B by PCR NEGATIVE NEGATIVE Final    Comment: (NOTE) The Xpert Xpress SARS-CoV-2/FLU/RSV plus assay is intended as an aid in the diagnosis of influenza from Nasopharyngeal swab specimens and should not be used as a sole basis for treatment. Nasal washings and aspirates are unacceptable for Xpert Xpress SARS-CoV-2/FLU/RSV testing.  Fact Sheet for Patients: BloggerCourse.com  Fact Sheet for Healthcare Providers: SeriousBroker.it  This test is not yet approved or cleared by the [place]  FDA and has been authorized for detection and/or diagnosis of SARS-CoV-2 by FDA under an Emergency Use Authorization (EUA). This EUA will remain in effect (meaning this test can be used) for the duration of the COVID-19 declaration under Section 564(b)(1) of the Act, 21 U.S.C. section 360bbb-3(b)(1), unless the authorization is terminated or revoked.     Resp Syncytial  Virus by PCR NEGATIVE NEGATIVE Final    Comment: (NOTE) Fact Sheet for Patients: BloggerCourse.com  Fact Sheet for Healthcare Providers: SeriousBroker.it  This test is not yet approved or cleared by the [place]  FDA and has been authorized for detection and/or diagnosis of SARS-CoV-2 by FDA under an Emergency Use Authorization (EUA). This EUA will remain in effect (meaning this test can be used) for the duration of the COVID-19 declaration under Section 564(b)(1) of the Act, 21 U.S.C. section 360bbb-3(b)(1), unless the authorization is terminated or revoked.  Performed at [facility], [address] [place], [place] [address]   Respiratory (~20 pathogens) panel by PCR     Status: None   Collection Time: 09/26/23  8:30 AM   Specimen: Nasopharyngeal Swab; Respiratory  Result Value Ref Range Status   Adenovirus NOT DETECTED NOT DETECTED Final   Coronavirus 229E NOT DETECTED NOT DETECTED Final    Comment: (NOTE) The Coronavirus on the Respiratory Panel, DOES NOT test for the novel  Coronavirus (2019 nCoV)    Coronavirus HKU1 NOT DETECTED NOT DETECTED Final   Coronavirus NL63 NOT DETECTED NOT DETECTED Final   Coronavirus OC43 NOT DETECTED NOT DETECTED Final   Metapneumovirus NOT DETECTED NOT DETECTED Final   Rhinovirus / Enterovirus NOT DETECTED NOT DETECTED Final   Influenza A NOT DETECTED NOT DETECTED Final   Influenza B NOT DETECTED NOT DETECTED Final   Parainfluenza Virus 1 NOT DETECTED NOT DETECTED Final   Parainfluenza Virus 2 NOT DETECTED NOT DETECTED Final   Parainfluenza Virus 3 NOT DETECTED NOT DETECTED Final   Parainfluenza Virus 4 NOT DETECTED NOT DETECTED Final   Respiratory Syncytial Virus NOT DETECTED NOT DETECTED Final   Bordetella pertussis NOT DETECTED NOT DETECTED Final   Bordetella Parapertussis NOT DETECTED NOT DETECTED Final   Chlamydophila pneumoniae NOT DETECTED NOT DETECTED Final   Mycoplasma  pneumoniae NOT DETECTED NOT DETECTED Final    Comment: Performed at [facility], [address] [place], [place] [address]  MRSA Next Gen by PCR, Nasal     Status: None   Collection Time: 09/27/23 11:59 AM   Specimen: Nasal Mucosa; Nasal Swab  Result Value Ref Range Status   MRSA by PCR Next Gen NOT DETECTED NOT DETECTED Final    Comment: (NOTE) The GeneXpert MRSA Assay (FDA approved for NASAL specimens only), is one component of a comprehensive MRSA colonization surveillance program. It is not intended to diagnose MRSA infection nor to guide or monitor treatment for MRSA  infections. Test performance is not FDA approved in patients less than 67 years old. Performed at [facility], [address], [place], [place] [address]   Culture, blood (Routine X 2) w Reflex to ID Panel     Status: Abnormal   Collection Time: 09/29/23 12:35 PM   Specimen: BLOOD LEFT HAND  Result Value Ref Range Status   Specimen Description   Final    BLOOD LEFT HAND BOTTLES DRAWN AEROBIC AND ANAEROBIC Performed at [facility], [address] [place], [place] [address]    Special Requests   Final    Blood Culture adequate volume Performed at [facility], [address] [place], [place] [address]    Culture  Setup Time   Final    IN BOTH AEROBIC AND ANAEROBIC BOTTLES GRAM POSITIVE COCCI Gram Stain Report Called to,Read Back By and Verified With: A RAY RN9/22/25 @1006  BY J. WHITE  GRAM STAIN REVIEWED-AGREE WITH RESULT DRT    Culture STAPHYLOCOCCUS AUREUS (A)  Final   Report Status 10/02/2023 FINAL  Final   Organism ID, Bacteria STAPHYLOCOCCUS AUREUS  Final      Susceptibility   Staphylococcus aureus - MIC*    CIPROFLOXACIN  <=0.5 SENSITIVE Sensitive     ERYTHROMYCIN >=8 RESISTANT Resistant     GENTAMICIN <=0.5 SENSITIVE Sensitive     OXACILLIN <=0.25 SENSITIVE Sensitive     TETRACYCLINE <=1 SENSITIVE Sensitive     VANCOMYCIN  <=0.5 SENSITIVE Sensitive     TRIMETH/SULFA <=10 SENSITIVE Sensitive      CLINDAMYCIN RESISTANT Resistant     RIFAMPIN <=0.5 SENSITIVE Sensitive     Inducible Clindamycin POSITIVE Resistant     LINEZOLID 2 SENSITIVE Sensitive     * STAPHYLOCOCCUS AUREUS  Blood Culture ID Panel (Reflexed)     Status: Abnormal   Collection Time: 09/29/23 12:35 PM  Result Value Ref Range Status   Enterococcus faecalis NOT DETECTED NOT DETECTED Final   Enterococcus Faecium NOT DETECTED NOT DETECTED Final   Listeria monocytogenes NOT DETECTED NOT DETECTED Final   Staphylococcus species DETECTED (A) NOT DETECTED Final    Comment: CRITICAL RESULT CALLED TO, READ BACK BY AND VERIFIED WITH: PHARMD LORI POOLE ON 09/30/23 @ 1430 BY DRT    Staphylococcus aureus (BCID) DETECTED (A) NOT DETECTED Final    Comment: CRITICAL RESULT CALLED TO, READ BACK BY AND VERIFIED WITH: PHARMD LORI POOLE ON 09/30/23 @ 1430 BY DRT    Staphylococcus epidermidis NOT DETECTED NOT DETECTED Final   Staphylococcus lugdunensis NOT DETECTED NOT DETECTED Final   Streptococcus species NOT DETECTED NOT DETECTED Final   Streptococcus agalactiae NOT DETECTED NOT DETECTED Final   Streptococcus pneumoniae NOT DETECTED NOT DETECTED Final   Streptococcus pyogenes NOT DETECTED NOT DETECTED Final   A.calcoaceticus-baumannii NOT DETECTED NOT DETECTED Final   Bacteroides fragilis NOT DETECTED NOT DETECTED Final   Enterobacterales NOT DETECTED NOT DETECTED Final   Enterobacter cloacae complex NOT DETECTED NOT DETECTED Final   Escherichia coli NOT DETECTED NOT DETECTED Final   Klebsiella aerogenes NOT DETECTED NOT DETECTED Final   Klebsiella oxytoca NOT DETECTED NOT DETECTED Final   Klebsiella pneumoniae NOT DETECTED NOT DETECTED Final   Proteus species NOT DETECTED NOT DETECTED Final   Salmonella species NOT DETECTED NOT DETECTED Final   Serratia marcescens NOT DETECTED NOT DETECTED Final   Haemophilus influenzae NOT DETECTED NOT DETECTED Final   Neisseria meningitidis NOT DETECTED NOT DETECTED Final   Pseudomonas  aeruginosa NOT DETECTED NOT DETECTED Final   Stenotrophomonas maltophilia NOT DETECTED  NOT DETECTED Final   Candida albicans NOT DETECTED NOT DETECTED Final   Candida auris NOT DETECTED NOT DETECTED Final   Candida glabrata NOT DETECTED NOT DETECTED Final   Candida krusei NOT DETECTED NOT DETECTED Final   Candida parapsilosis NOT DETECTED NOT DETECTED Final   Candida tropicalis NOT DETECTED NOT DETECTED Final   Cryptococcus neoformans/gattii NOT DETECTED NOT DETECTED Final   Meth resistant mecA/C and MREJ NOT DETECTED NOT DETECTED Final    Comment: Performed at [facility], [address], [place], [place] [address]  Culture, blood (Routine X 2) w Reflex to ID Panel     Status: Abnormal   Collection Time: 09/29/23 12:50 PM   Specimen: BLOOD RIGHT HAND  Result Value Ref Range Status   Specimen Description   Final    BLOOD RIGHT HAND BOTTLES DRAWN AEROBIC AND ANAEROBIC Performed at [facility], [address], [place], [place] [address]    Special Requests   Final    Blood Culture adequate volume Performed at [facility], [address], [place], [place] [address]    Culture  Setup Time   Final    AEROBIC BOTTLE ONLY GRAM POSITIVE COCCI Gram Stain Report Called to,Read Back By and Verified With: A RAY RN 09/30/23 @1010  BY J. WHITE GRAM STAIN REVIEWED-AGREE WITH RESULT DRT    Culture (A)  Final    STAPHYLOCOCCUS AUREUS SUSCEPTIBILITIES PERFORMED ON PREVIOUS CULTURE WITHIN THE LAST 5 DAYS. Performed at [facility], [address] [place], [place] [address]    Report Status 10/02/2023 FINAL  Final  Culture, blood (Routine X 2) w Reflex to ID Panel     Status: None (Preliminary result)   Collection Time: 10/01/23  4:02 AM   Specimen: BLOOD LEFT ARM  Result Value Ref Range Status   Specimen Description BLOOD LEFT ARM  Final   Special Requests   Final    BOTTLES DRAWN AEROBIC AND ANAEROBIC Blood Culture adequate volume   Culture   Final    NO GROWTH 2 DAYS Performed  at [facility], [address], [place], [place], [place] [address]    Report Status PENDING  Incomplete  Culture, blood (Routine X 2) w Reflex to ID Panel     Status: None (Preliminary result)   Collection Time: 10/01/23  4:05 AM   Specimen: BLOOD LEFT HAND  Result Value Ref Range Status   Specimen Description BLOOD LEFT HAND  Final   Special Requests   Final    BOTTLES DRAWN AEROBIC AND ANAEROBIC Blood Culture adequate volume   Culture   Final    NO GROWTH 2 DAYS Performed at [facility], [address], [place], [place] [address]    Report Status PENDING  Incomplete         Radiology Studies: US  EKG SITE RITE Result Date: 10/03/2023 If Site Rite image not attached, placement could not be confirmed due to current cardiac rhythm.  ECHOCARDIOGRAM COMPLETE Result Date: 10/02/2023    ECHOCARDIOGRAM REPORT   Patient Name:   [name] Russom Date of Exam: 10/01/2023 Medical Rec #:  [id number]        Height:       72.0 in Accession #:    [id number]       Weight:       352.5 lb Date of Birth:  01-23-1965        BSA:          2.711 m Patient Age:  58 years         BP:           145/103 mmHg Patient Gender: M                HR:           75 bpm. Exam Location:  [facility] Procedure: 2D Echo, Cardiac Doppler, Color Doppler and Intracardiac            Opacification Agent (Both Spectral and Color Flow Doppler were            utilized during procedure). Indications:     Bacteremia R78.81  History:         Patient has prior history of Echocardiogram examinations, most                  recent 04/22/2019. CHF, Arrythmias:Atrial Fibrillation; Risk                  Factors:Hypertension, Diabetes, Dyslipidemia, Non-Smoker and                  Sleep Apnea. Hx of ETOH abuse and morbid obesity.  Sonographer:     Aida Pizza RCS Referring Phys:  [phone] DAVID TAT Diagnosing Phys: Jayson Sierras MD  Sonographer Comments: Suboptimal apical window, suboptimal subcostal window and Technically difficult study due to poor echo  windows. Image acquisition challenging due to patient body habitus. IMPRESSIONS  1. Images are limited.  2. Left ventricular ejection fraction, by estimation, is 70 to 75%. The left ventricle has hyperdynamic function. The left ventricle has no regional wall motion abnormalities. There is moderate asymmetric left ventricular hypertrophy of the basal segment. Left ventricular diastolic parameters are indeterminate.  3. Right ventricular systolic function is normal. The right ventricular size is mildly enlarged. Tricuspid regurgitation signal is inadequate for assessing PA pressure.  4. Left atrial size was severely dilated.  5. The mitral valve is grossly normal. Trivial mitral valve regurgitation.  6. The aortic valve is tricuspid. Aortic valve regurgitation is trivial. Probable lambl's excrescences at leafet tips.  7. Aortic dilatation noted. There is borderline dilatation of the aortic root, measuring 40 mm.  8. Unable to estimate CVP. Comparison(s): Prior images reviewed side by side. No diagnostic valvular vegetations, consider TEE if high suspicion of endocarditis and otherwise clinically appropriate risk. FINDINGS  Left Ventricle: Left ventricular ejection fraction, by estimation, is 70 to 75%. The left ventricle has hyperdynamic function. The left ventricle has no regional wall motion abnormalities. Definity  contrast agent was given IV to delineate the left ventricular endocardial borders. The left ventricular internal cavity size was normal in size. There is moderate asymmetric left ventricular hypertrophy of the basal segment. Left ventricular diastolic function could not be evaluated due to atrial fibrillation. Left ventricular diastolic parameters are indeterminate. Right Ventricle: The right ventricular size is mildly enlarged. No increase in right ventricular wall thickness. Right ventricular systolic function is normal. Tricuspid regurgitation signal is inadequate for assessing PA pressure. Left  Atrium: Left atrial size was severely dilated. Right Atrium: Right atrial size was normal in size. Pericardium: There is no evidence of pericardial effusion. Presence of epicardial fat layer. Mitral Valve: The mitral valve is grossly normal. Trivial mitral valve regurgitation. Tricuspid Valve: The tricuspid valve is grossly normal. Tricuspid valve regurgitation is trivial. Aortic Valve: The aortic valve is tricuspid. Aortic valve regurgitation is trivial. Pulmonic Valve: The pulmonic valve was grossly normal. Pulmonic valve regurgitation is trivial. Aorta: Aortic dilatation noted. There  is borderline dilatation of the aortic root, measuring 40 mm. Venous: Unable to estimate CVP. IAS/Shunts: No atrial level shunt detected by color flow Doppler. Additional Comments: 3D was performed not requiring image post processing on an independent workstation and was indeterminate.  LEFT VENTRICLE PLAX 2D LVIDd:         5.30 cm   Diastology LVIDs:         2.30 cm   LV e' medial:    9.57 cm/s LV PW:         1.40 cm   LV E/e' medial:  9.2 LV IVS:        1.20 cm   LV e' lateral:   9.32 cm/s LVOT diam:     1.90 cm   LV E/e' lateral: 9.5 LV SV:         48 LV SV Index:   18 LVOT Area:     2.84 cm  RIGHT VENTRICLE RV S prime:     18.00 cm/s TAPSE (M-mode): 1.6 cm LEFT ATRIUM              Index        RIGHT ATRIUM           Index LA diam:        5.30 cm  1.96 cm/m   RA Area:     20.00 cm LA Vol (A2C):   116.0 ml 42.79 ml/m  RA Volume:   59.40 ml  21.91 ml/m LA Vol (A4C):   193.0 ml 71.20 ml/m LA Biplane Vol: 163.0 ml 60.13 ml/m  AORTIC VALVE LVOT Vmax:   82.90 cm/s LVOT Vmean:  51.850 cm/s LVOT VTI:    0.168 m  AORTA Ao Root diam: 4.00 cm MITRAL VALVE MV Area (PHT): 2.24 cm    SHUNTS MV Decel Time: 338 msec    Systemic VTI:  0.17 m MV E velocity: 88.20 cm/s  Systemic Diam: 1.90 cm Jayson Sierras MD Electronically signed by Jayson Sierras MD Signature Date/Time: 10/01/2023/3:24:46 PM    Final (Updated)         Scheduled  Meds:  Chlorhexidine  Gluconate Cloth  6 each Topical Q0600   diltiazem   240 mg Oral Daily   hydrocortisone   20 mg Oral BID   lactulose   30 g Oral TID   levalbuterol   0.63 mg Nebulization BID   levothyroxine   125 mcg Oral Q0600   magic mouthwash  10 mL Oral TID   metoprolol  tartrate  50 mg Oral BID   nystatin    Topical BID   potassium chloride   40 mEq Oral Q4H   thiamine   100 mg Oral Daily   Continuous Infusions:   ceFAZolin  (ANCEF ) IV 2 g (10/03/23 0550)     LOS: 7 days    Time spent: 55 minutes    Jalea Bronaugh JONETTA Fairly, DO [facility]  If 7PM-7AM, please contact night-coverage [email or web address] 10/03/2023, 11:13 AM

## 2023-10-04 ENCOUNTER — Inpatient Hospital Stay (HOSPITAL_COMMUNITY)

## 2023-10-04 DIAGNOSIS — G9341 Metabolic encephalopathy: Secondary | ICD-10-CM | POA: Diagnosis not present

## 2023-10-04 LAB — COMPREHENSIVE METABOLIC PANEL WITH GFR
ALT: 14 U/L (ref 0–44)
AST: 76 U/L — ABNORMAL HIGH (ref 15–41)
Albumin: 2.5 g/dL — ABNORMAL LOW (ref 3.5–5.0)
Alkaline Phosphatase: 103 U/L (ref 38–126)
Anion gap: 7 (ref 5–15)
BUN: 17 mg/dL (ref 6–20)
CO2: 23 mmol/L (ref 22–32)
Calcium: 8.5 mg/dL — ABNORMAL LOW (ref 8.9–10.3)
Chloride: 103 mmol/L (ref 98–111)
Creatinine, Ser: 1.01 mg/dL (ref 0.61–1.24)
GFR, Estimated: >60 mL/min (ref >60)
Glucose, Bld: 125 mg/dL — ABNORMAL HIGH (ref 70–99)
Potassium: 2.9 mmol/L — ABNORMAL LOW (ref 3.5–5.1)
Sodium: 133 mmol/L — ABNORMAL LOW (ref 135–145)
Total Bilirubin: 3 mg/dL — ABNORMAL HIGH (ref 0.0–1.2)
Total Protein: 6.9 g/dL (ref 6.5–8.1)

## 2023-10-04 LAB — CBC
HCT: 36.4 % — ABNORMAL LOW (ref 39.0–52.0)
Hemoglobin: 12.1 g/dL — ABNORMAL LOW (ref 13.0–17.0)
MCH: 30.9 pg (ref 26.0–34.0)
MCHC: 33.2 g/dL (ref 30.0–36.0)
MCV: 92.9 fL (ref 80.0–100.0)
Platelets: 57 K/uL — ABNORMAL LOW (ref 150–400)
RBC: 3.92 MIL/uL — ABNORMAL LOW (ref 4.22–5.81)
RDW: 16.7 % — ABNORMAL HIGH (ref 11.5–15.5)
WBC: 5.9 K/uL (ref 4.0–10.5)
nRBC: 0 % (ref 0.0–0.2)

## 2023-10-04 LAB — MAGNESIUM: Magnesium: 2.2 mg/dL (ref 1.7–2.4)

## 2023-10-04 MED ORDER — SODIUM CHLORIDE 0.9% FLUSH
10.0000 mL | Freq: Two times a day (BID) | INTRAVENOUS | Status: DC
  Administered 2023-10-04 – 2023-10-05 (×2): 10 mL

## 2023-10-04 MED ORDER — SODIUM CHLORIDE 0.9% FLUSH: 10.0000 mL | INTRAVENOUS | Status: DC | PRN

## 2023-10-04 MED ORDER — POTASSIUM CHLORIDE 10 MEQ/100ML IV SOLN
10.0000 meq | INTRAVENOUS | Status: AC
  Administered 2023-10-04 (×4): 10 meq via INTRAVENOUS
  Filled 2023-10-04 (×4): qty 100

## 2023-10-04 MED ORDER — POTASSIUM CHLORIDE CRYS ER 20 MEQ PO TBCR
40.0000 meq | EXTENDED_RELEASE_TABLET | ORAL | Status: AC
  Administered 2023-10-04 (×2): 40 meq via ORAL
  Filled 2023-10-04 (×2): qty 2

## 2023-10-04 NOTE — Progress Notes (Signed)
 Physical Therapy Treatment Patient Details Name: [name] MRN: [id number] DOB: 1965/04/13 Today's Date: 10/04/2023   History of Present Illness [name] is a 58 y.o. male with medical history significant for diastolic CHF, asthma, atrial fibrillation, CHF, colic liver cirrhosis with esophageal varices and hepatic encephalopathy history.   Patient was brought to the ED from nursing home with reports of abnormal ammonia, and low potassium level.  At time of my evaluation, patient is awake and alert, is able to tell me his name but not much else.  He is able to talk but his speech is confused.  Per staff at nursing home, ammonia was initially in the 70s, he was given a dose of lactulose  and on repeat ammonia was 63, with altered mental status he was sent to the ED.     Patient was recently admitted at [facility], 9 days on discharged 9/12, was managed for hepatic encephalopathy, volume overload.  Admitting ammonia was greater than 100, and was 20 head CT was negative for acute abnormal.  Per discharge summary, he remained intermittently confused but was easily redirected with but no focal neurologic deficits.    PT Comments  Patient presents lethargic and requires Max verbal/tactile cueing for participating with therapy. Patient demonstrates slow labored movement for sitting up at bedside with most difficulty moving BLE due to weakness, once seated able to maintain sitting balance, unable to attempt sit to stands due to lethargy possible du to medications and flopped back into to requiring Max assist for repositioning. Patient will benefit from continued skilled physical therapy in hospital and recommended venue below to increase strength, balance, endurance for safe ADLs and gait.      If plan is discharge home, recommend the following: A lot of help with bathing/dressing/bathroom;Help with stairs or ramp for entrance;Assistance with cooking/housework;Assist for transportation;A lot of  help with walking and/or transfers   Can travel by private vehicle     No  Equipment Recommendations  Rolling walker (2 wheels)    Recommendations for Other Services       Precautions / Restrictions Precautions Precautions: Fall Recall of Precautions/Restrictions: Impaired Restrictions Weight Bearing Restrictions Per Provider Order: No     Mobility  Bed Mobility Overal bed mobility: Needs Assistance Bed Mobility: Supine to Sit, Sit to Supine     Supine to sit: Mod assist, Max assist Sit to supine: Mod assist   General bed mobility comments: slow labored  movement    Transfers                        Ambulation/Gait                   Stairs             Wheelchair Mobility     Tilt Bed    Modified Rankin (Stroke Patients Only)       Balance Overall balance assessment: Needs assistance Sitting-balance support: Feet supported, No upper extremity supported Sitting balance-Leahy Scale: Poor Sitting balance - Comments: fair/poor seated at EOB                                    Communication Communication Communication: Impaired Factors Affecting Communication: Difficulty expressing self  Cognition Arousal: Lethargic Behavior During Therapy: Flat affect   PT - Cognitive impairments: History of cognitive impairments, No family/caregiver present to determine baseline  PT - Cognition Comments: requires repeated verbal/tactile cueing for following directions Following commands: Impaired Following commands impaired: Follows one step commands inconsistently    Cueing Cueing Techniques: Verbal cues, Tactile cues, Gestural cues  Exercises      General Comments        Pertinent Vitals/Pain Pain Assessment Pain Assessment: Faces Faces Pain Scale: Hurts a little bit Pain Location: legs with pressure Pain Descriptors / Indicators: Discomfort, Grimacing Pain Intervention(s): Limited  activity within patient's tolerance, Monitored during session, Repositioned    Home Living                          Prior Function            PT Goals (current goals can now be found in the care plan section) Acute Rehab PT Goals Patient Stated Goal: return home PT Goal Formulation: With patient Time For Goal Achievement: 10/16/23 Potential to Achieve Goals: Good Progress towards PT goals: Progressing toward goals    Frequency    Min 3X/week      PT Plan      Co-evaluation              AM-PAC PT 6 Clicks Mobility   Outcome Measure  Help needed turning from your back to your side while in a flat bed without using bedrails?: A Lot Help needed moving from lying on your back to sitting on the side of a flat bed without using bedrails?: A Lot Help needed moving to and from a bed to a chair (including a wheelchair)?: A Lot Help needed standing up from a chair using your arms (e.g., wheelchair or bedside chair)?: A Lot Help needed to walk in hospital room?: A Lot Help needed climbing 3-5 steps with a railing? : Total 6 Click Score: 11    End of Session   Activity Tolerance: Patient limited by fatigue;Patient limited by lethargy Patient left: in bed;with call bell/phone within reach;with bed alarm set Nurse Communication: Mobility status PT Visit Diagnosis: Muscle weakness (generalized) (M62.81);Unsteadiness on feet (R26.81);Other abnormalities of gait and mobility (R26.89)     Time: 8967-8947 PT Time Calculation (min) (ACUTE ONLY): 20 min  Charges:    $Therapeutic Activity: 8-22 mins PT General Charges $$ ACUTE PT VISIT: 1 Visit                     2:46 PM, 10/04/23 Lynwood Music, MPT Physical Therapist with [facility] [phone] office [phone] mobile phone

## 2023-10-04 NOTE — Progress Notes (Signed)
 Date of Admission:  09/25/2023    ID: [name] is a 58 y.o. male Principal Problem:   Acute metabolic encephalopathy Active Problems:   Obstructive sleep apnea of adult   Hypertension, essential   Atrial fibrillation (HCC)   Cirrhosis (HCC)   Thrombocytopenia   Hypokalemia   CHF (congestive heart failure) (HCC)   Esophageal varices (HCC)   Paroxysmal atrial fibrillation with RVR (HCC)   Hepatic encephalopathy (HCC)   Gram-positive bacteremia   Staphylococcus aureus bacteremia  Patient seen through telehealth video visit- limited examination Spoke to his nurse Pt is somnolent today - he had received ativan  for PICC placement in the afternoon- and also early this morning Subjective: Non available  Medications:   Chlorhexidine  Gluconate Cloth  6 each Topical Q0600   diltiazem   240 mg Oral Daily   hydrocortisone   20 mg Oral BID   lactulose   30 g Oral TID   levalbuterol   0.63 mg Nebulization BID   levothyroxine   125 mcg Oral Q0600   magic mouthwash  10 mL Oral TID   metoprolol  tartrate  50 mg Oral BID   nystatin    Topical BID   sodium chloride  flush  10-40 mL Intracatheter Q12H   thiamine   100 mg Oral Daily    Objective: Vital signs in last 24 hours: Patient Vitals for the past 24 hrs:  BP Temp Temp src Pulse Resp SpO2  10/04/23 1326 124/79 97.8 F (36.6 C) Axillary (!) 51 -- 96 %  10/04/23 0908 (!) 137/95 -- -- 64 -- 98 %  10/04/23 0803 -- -- -- -- -- 94 %  10/04/23 0428 (!) 124/95 97.6 F (36.4 C) Oral 69 -- 97 %  10/03/23 2142 123/85 -- -- 76 -- --  10/03/23 2047 -- -- -- -- -- 96 %  10/03/23 1935 98/70 97.8 F (36.6 C) Oral 65 16 97 %    Left PICc line PHYSICAL EXAM:  General: somnolent (?due to ativan ) Increased BMI Abdomen: Prominent veins Periumbilical hernia edema Extremities: edema legs improved Edema scrotum Intertrigo in the groin area  skin: Bruising over the arms Lymph: Cervical, supraclavicular normal. Neurologic: cannot assess  Lab  Results    Latest Ref Rng & Units 10/04/2023    4:54 AM 10/03/2023    4:17 AM 10/02/2023    8:46 AM  CBC  WBC 4.0 - 10.5 K/uL 5.9  6.1  5.5   Hemoglobin 13.0 - 17.0 g/dL 87.8  88.5  88.4   Hematocrit 39.0 - 52.0 % 36.4  33.8  34.6   Platelets 150 - 400 K/uL 57  56  46        Latest Ref Rng & Units 10/04/2023    4:54 AM 10/03/2023    4:17 AM 10/02/2023    8:46 AM  CMP  Glucose 70 - 99 mg/dL 874  866  874   BUN 6 - 20 mg/dL 17  18  16    Creatinine 0.61 - 1.24 mg/dL 8.98  8.98  8.91   Sodium 135 - 145 mmol/L 133  130  133   Potassium 3.5 - 5.1 mmol/L 2.9  3.1  3.1   Chloride 98 - 111 mmol/L 103  101  101   CO2 22 - 32 mmol/L 23  22  23    Calcium 8.9 - 10.3 mg/dL 8.5  8.5  8.6   Total Protein 6.5 - 8.1 g/dL 6.9   7.0   Total Bilirubin 0.0 - 1.2 mg/dL 3.0   2.7  Alkaline Phos 38 - 126 U/L 103   95   AST 15 - 41 U/L 76   59   ALT 0 - 44 U/L 14   21       Microbiology: 09/29/2023 blood culture staph aureus 4 out of 4 10/01/2023 blood cultures have been sent Studies/Results: DG Chest Portable 1 View Addendum Date: 10/04/2023 ADDENDUM REPORT: 10/04/2023 17:43 ADDENDUM: Upon further evaluation, a left-sided PICC line is seen. Its distal end is poorly visualized and appears to be located within the superior vena cava, just beyond the junction of the superior vena cava and left brachiocephalic vein. Electronically Signed   By: Suzen Dials M.D.   On: 10/04/2023 17:43   Result Date: 10/04/2023 CLINICAL DATA:  PICC line placement. EXAM: PORTABLE CHEST 1 VIEW COMPARISON:  September 29, 2023 FINDINGS: The heart size and mediastinal contours are within normal limits. Low lung volumes are noted. There is prominence of the central pulmonary vasculature. Mild bilateral perihilar linear scarring and/or atelectasis is seen. No pleural effusion or pneumothorax is identified. The visualized skeletal structures are unremarkable. IMPRESSION: Low lung volumes with vascular congestion and with mild  bilateral perihilar linear scarring and/or atelectasis. Electronically Signed: By: Suzen Dials M.D. On: 10/04/2023 17:03   US  EKG SITE RITE Result Date: 10/03/2023 If Site Rite image not attached, placement could not be confirmed due to current cardiac rhythm.    Assessment/Plan: Acute metabolic encephalopathy secondary to hepatic encephalopathy Patient was recently in [facility] admitted for the same condition and then sent to SNF but came to [facility] on 917 with confusion and hypokalemia  Alcoholic cirrhosis with decompensation with portal hypertension with thrombocytopenia and esophageal varices Portal vein, splenic vein and mesenteric vein thrombosis.  MSSA bacteremia No cardiac devices no central lines No hardware He has chronic changes in the skin over his legs   2D echo has been done- not an optimal study TEE could not be done as per cardiologist  as he has esophageal varices and thrombocytopenia  Recommended imaging  spine as he has high MSSA bioburden but as per hospitalist Dr.Shah no back pain and he may not be able to lie still  in MRI and would need sedation  Continue cefazolin . Will need for 6 weeks minimum- would need surveillance blood culture after that. If he c/o back pain during any time he will need imaging Repeat blood cultures neg- PICC was placed today   Anasarca  CHF Paroxysmal A-fib on diltiazem   Morbid obesity  Myxedema newly diagnosed during this admission On thyroxine  H/o ecoli discitis and vertebral osteo/paraspinal abscess  L2-L3 in 2021 and was treated with 6 weeks of antibiotic ( IV ceftriaxone/levaquin) -was followed by [facility]   Discussed with his nurse/Hospitalist OPAT order placed Will follow him as OP  ID will sign off- call if needed

## 2023-10-04 NOTE — Progress Notes (Signed)
 Peripherally Inserted Central Catheter Placement  The IV Nurse has discussed with the patient and/or persons authorized to consent for the patient, the purpose of this procedure and the potential benefits and risks involved with this procedure.  The benefits include less needle sticks, lab draws from the catheter, and the patient may be discharged home with the catheter. Risks include, but not limited to, infection, bleeding, blood clot (thrombus formation), and puncture of an artery; nerve damage and irregular heartbeat and possibility to perform a PICC exchange if needed/ordered by physician.  Alternatives to this procedure were also discussed.  Bard Power PICC patient education guide, fact sheet on infection prevention and patient information card has been provided to patient /or left at bedside.  Patient has legal guardian of state. Pt confused. Spoke with [place] Parents with DSS and I was given permission to insert PICC, verified with second RN, [place] while [place] Parents gave permission.   PICC Placement Documentation  PICC Single Lumen 10/04/23 Left Brachial 50 cm 0 cm (Active)  Indication for Insertion or Continuance of Line Prolonged intravenous therapies 10/04/23 1630  Exposed Catheter (cm) 0 cm 10/04/23 1630  Site Assessment Clean, Dry, Intact 10/04/23 1630  Line Status Flushed;Saline locked;Blood return noted 10/04/23 1630  Dressing Type Transparent;Securing device 10/04/23 1630  Dressing Status Antimicrobial disc/dressing in place;Clean, Dry, Intact 10/04/23 1630  Line Care Cap changed;Connections checked and tightened 10/04/23 1630  Dressing Change Due 10/11/23 10/04/23 1630       [name] 10/04/2023, 4:31 PM

## 2023-10-04 NOTE — TOC Progression Note (Signed)
 Transition of Care [facility]) - Progression Note    Patient Details  Name: [name] MRN: [id number] Date of Birth: 1965/10/07  Transition of Care [facility] CM/SW Contact  Sharlyne Stabs, RN Phone Number: 10/04/2023, 9:49 AM  Clinical Narrative:    Insurance approved 9/25 - 9/29, next review 9/29, mayme barrows id [id number], plan auth id [id number] for [facility]. Patient has been with out a sitter for 24 hours and will get a PICC line today. Debbie approved for patient to return the weekend.  Jennine - Legal Guardian updated with discharge plan.    Expected Discharge Plan: Skilled Nursing Facility Barriers to Discharge: Continued Medical Work up [employer])  DSS Guardianship request start date: 10/01/23 DSS Service county: [facility] DSS Date of decision notification: 10/01/23 DSS Guardianship decision: Substantiated Guardianship court date: 10/01/23 New Guardianship awarded: Yes  Expected Discharge Plan and Services      Living arrangements for the past 2 months: Single Family Home                   Social Drivers of Health (SDOH) Interventions SDOH Screenings   Food Insecurity: Patient Unable To Answer (09/25/2023)  Housing: Low Risk  (09/30/2023)  Transportation Needs: Patient Unable To Answer (09/25/2023)  Utilities: Patient Unable To Answer (09/25/2023)  Recent Concern: Utilities - At Risk (08/18/2023)   Received from [facility]  Depression (PHQ2-9): Low Risk  (03/07/2020)  Financial Resource Strain: Low Risk  (07/22/2023)   Received from [facility]  Physical Activity: Inactive (07/22/2023)   Received from [facility]  Social Connections: Moderately Isolated (07/22/2023)   Received from [facility]  Stress: No Stress Concern Present (08/18/2023)   Received from [facility]  Tobacco Use: High Risk (09/25/2023)  Health Literacy: Low Risk  (07/22/2023)   [facility] from [facility]    Readmission Risk Interventions    10/03/2023    9:50 AM   Readmission Risk Prevention Plan  Transportation Screening Complete  HRI or Home Care Consult Complete  Social Work Consult for Recovery Care Planning/Counseling Complete  Palliative Care Screening Not Applicable  Medication Review [employer]) Complete

## 2023-10-04 NOTE — Progress Notes (Signed)
 PROGRESS NOTE    [name]  FMW:[id number] DOB: [birth year] DOA: 09/25/2023 PCP: Carleen Dallas PARAS, FNP   Brief Narrative:    58 year old male with a history of HFpEF, paroxysmal atrial fibrillation, alcoholic liver cirrhosis, portal hypertension with esophageal varices, thrombocytopenia, COPD, OSA, portal/splenic/mesenteric vein thrombosis, chronic back pain obesity presented from [facility] for abnormal labs.  Patient was admitted with acute metabolic encephalopathy/hepatic encephalopathy and continues to remain encephalopathic, but is now more interactive.  He did not have capacity to make decisions and has emergency guardianship.  He was also noted to have MSSA bacteremia and cannot have TEE due to esophageal varices and issues with thrombocytopenia.  He will be treated for presumptive endocarditis with placement of PICC line and need for 6-week course of IV antibiotics.  Patient will now need to remain without sitter for approximately 48 hours before he can be discharged back to SNF.  Assessment & Plan:   Principal Problem:   Acute metabolic encephalopathy Active Problems:   Obstructive sleep apnea of adult   Hypertension, essential   Atrial fibrillation (HCC)   Cirrhosis (HCC)   Thrombocytopenia   Hypokalemia   CHF (congestive heart failure) (HCC)   Esophageal varices (HCC)   Paroxysmal atrial fibrillation with RVR (HCC)   Hepatic encephalopathy (HCC)   Gram-positive bacteremia   Staphylococcus aureus bacteremia  Assessment and Plan:   Acute metabolic encephalopathy/hepatic encephalopathy-stable - multifactorial including hepatic encephalopathy, severe hypothyroidism, Wernicke's/Korsakoff enceph - remains encephalopathic, intermittently agitated - Repeat ammonia--38>>51>>45 - B12--2954 - TSH-45.347 - folate 15.0 - Urine drug screen--neg - UA--no pyuria - CT brain--neg - Continue lactulose --increase dose to 30 g TID - 09/26/2023 ABG 7.41/51/73/32 - Minimize  hypnotic medications -finished high dose thiamine  x 6 doses - more interactive but remains encephalopathic - The patient does not have capacity to make decisions regarding his person and medical care -TOC consulted--assisted in obtaining emergency guardianship from social services -Doak has been discontinued for over 24 hours; anticipate he can go back to SNF on 9/27    MSSA Bacteremia -9/21 blood culture>> MSSA 2/2 -started cefazolin  IV -repeat blood culture 9/23 negative thus far -Plan to place PICC today due to blood cultures remaining negative with anticipated 6-week course of Ancef  per ID with OPAT orders placed -Patient cannot have TEE due to concerns of varices as well as thrombocytopenia  Hyponatremia-improving -Likely in the setting of hypothyroidism as well as adrenal insufficiency -Noted to be somewhat volume overloaded at this time, but will not tolerate diuresis given soft blood pressure readings -Plan to limit oral fluid intake to 1200 mL and follow   Hypokalemia -Replete and follow-up   Paroxysmal atrial fibrillation with RVR-improved - continue Lopressor  IV while the patient is somnolent - 9/19 started diltiazem  drip>>transition to po 9/21 - Not a candidate for anticoagulation secondary to liver cirrhosis, thrombocytopenia, and poor compliance - 9/23--transition to long-acting diltiazem  CD and oral metoprolol    Hypothyroidism/Myxedema -TSH 45.347, Free T4 0.61, Free T3 = 1.8 -start low dose synthroid  IV and titrate slowly up - Continue now on oral Synthroid    Relative adrenal insufficiency -cortrosyn  stimulation 11.8>>14.4>>15.6 - Patient on oral Cortef    Chronic HFpEF - 08/20/2023 echo EF 55 to 60%, no WMA, normal RVF, difficult study secondary to patient's body habitus   History of alcohol abuse/polysubstance abuse - Urine drug screen--neg   Alcoholic liver cirrhosis - Patient has history of esophageal varices and mesenteric vein thrombosis - now fluid  overloaded>>given lasix  IV x 1 9/21  Hypokalemia - Replete - mag 2.1   Thrombocytopenia - Secondary to liver cirrhosis - Monitor for signs of bleeding -Follow CBC with downward trend noted   Morbid obesity - BMI 46.94 - Lifestyle modification   Goals of Care -consult palliative -- The patient does not have capacity to make decisions regarding his person and medical care -TOC consulted--assisted in obtaining emergency guardianship from social services     DVT prophylaxis: SCDs Code Status: Full Family Communication: None, sitter at bedside Disposition Plan:  Status is: Inpatient Remains inpatient appropriate because: Need for IV medications.   Consultants:  Discussed with cardiology ID  Procedures:  None  Antimicrobials:  Anti-infectives (From admission, onward)    Start     Dose/Rate Route Frequency Ordered Stop   10/01/23 0245  vancomycin  (VANCOREADY) IVPB 1500 mg/300 mL  Status:  Discontinued       Placed in Followed by Linked Group   1,500 mg 150 mL/hr over 120 Minutes Intravenous Every 12 hours 09/30/23 1349 09/30/23 1448   09/30/23 1530  ceFAZolin  (ANCEF ) IVPB 2g/100 mL premix        2 g 200 mL/hr over 30 Minutes Intravenous Every 8 hours 09/30/23 1448     09/30/23 1445  vancomycin  (VANCOREADY) IVPB 2000 mg/400 mL  Status:  Discontinued       Placed in Followed by Linked Group   2,000 mg 200 mL/hr over 120 Minutes Intravenous  Once 09/30/23 1349 09/30/23 1448       Subjective: Patient seen and evaluated today with no new acute complaints or concerns. No acute concerns or events noted overnight.  He was not able to get PICC line yesterday, but will get it this afternoon.  No sitter has been required over the last 24 hours.  Objective: Vitals:   10/03/23 2142 10/04/23 0428 10/04/23 0803 10/04/23 0908  BP: 123/85 (!) 124/95  (!) 137/95  Pulse: 76 69  64  Resp:      Temp:  97.6 F (36.4 C)    TempSrc:  Oral    SpO2:  97% 94% 98%  Weight:       Height:        Intake/Output Summary (Last 24 hours) at 10/04/2023 1004 Last data filed at 10/04/2023 0300 Gross per 24 hour  Intake 600 ml  Output 650 ml  Net -50 ml   Filed Weights   09/25/23 1417 09/25/23 1939 09/27/23 1212  Weight: (!) 172.4 kg (!) 157 kg (!) 159.9 kg    Examination:  General exam: Appears calm and comfortable, pleasantly confused, obese Respiratory system: Clear to auscultation. Respiratory effort normal.  1 L nasal cannula oxygen  Cardiovascular system: S1 & S2 heard, RRR.  Gastrointestinal system: Abdomen is soft, mildly distended Central nervous system: Alert and awake Extremities: No edema Skin: No significant lesions noted Psychiatry: Flat affect.    Data Reviewed: I have personally reviewed following labs and imaging studies  CBC: Recent Labs  Lab 09/30/23 0715 10/01/23 0405 10/02/23 0846 10/03/23 0417 10/04/23 0454  WBC 6.2 6.0 5.5 6.1 5.9  HGB 10.6* 11.2* 11.5* 11.4* 12.1*  HCT 31.5* 33.9* 34.6* 33.8* 36.4*  MCV 92.1 93.1 93.0 91.8 92.9  PLT 54* 55* 46* 56* 57*   Basic Metabolic Panel: Recent Labs  Lab 09/30/23 0715 10/01/23 0405 10/01/23 1000 10/02/23 0846 10/03/23 0417 10/04/23 0454  NA 139 138  --  133* 130* 133*  K 2.5* 2.6*  --  3.1* 3.1* 2.9*  CL 105 103  --  101  101 103  CO2 22 24  --  23 22 23   GLUCOSE 130* 141*  --  125* 133* 125*  BUN 13 14  --  16 18 17   CREATININE 1.00 1.00  --  1.08 1.01 1.01  CALCIUM 8.3* 8.4*  --  8.6* 8.5* 8.5*  MG 2.1  --  2.2 2.1 2.1 2.2   GFR: Estimated Creatinine Clearance: 121.6 mL/min (by C-G formula based on SCr of 1.01 mg/dL). Liver Function Tests: Recent Labs  Lab 09/29/23 0521 09/30/23 0715 10/01/23 0405 10/02/23 0846 10/04/23 0454  AST 47* 47* 52* 59* 76*  ALT 23 23 24 21 14   ALKPHOS 80 82 97 95 103  BILITOT 3.9* 3.1* 2.9* 2.7* 3.0*  PROT 7.0 6.8 7.3 7.0 6.9  ALBUMIN  2.6* 2.5* 2.6* 2.5* 2.5*   No results for input(s): LIPASE, AMYLASE in the last 168  hours. Recent Labs  Lab 09/28/23 0409 09/29/23 0521 10/01/23 0402  AMMONIA 51* 45* 22   Coagulation Profile: No results for input(s): INR, PROTIME in the last 168 hours. Cardiac Enzymes: No results for input(s): CKTOTAL, CKMB, CKMBINDEX, TROPONINI in the last 168 hours. BNP (last 3 results) No results for input(s): PROBNP in the last 8760 hours. HbA1C: No results for input(s): HGBA1C in the last 72 hours. CBG: No results for input(s): GLUCAP in the last 168 hours.  Lipid Profile: No results for input(s): CHOL, HDL, LDLCALC, TRIG, CHOLHDL, LDLDIRECT in the last 72 hours. Thyroid  Function Tests: No results for input(s): TSH, T4TOTAL, FREET4, T3FREE, THYROIDAB in the last 72 hours. Anemia Panel: No results for input(s): VITAMINB12, FOLATE, FERRITIN, TIBC, IRON , RETICCTPCT in the last 72 hours. Sepsis Labs: Recent Labs  Lab 09/29/23 0853  PROCALCITON <0.10    Recent Results (from the past 240 hours)  Resp panel by RT-PCR (RSV, Flu A&B, Covid) Anterior Nasal Swab     Status: None   Collection Time: 09/26/23  8:30 AM   Specimen: Anterior Nasal Swab  Result Value Ref Range Status   SARS Coronavirus 2 by RT PCR NEGATIVE NEGATIVE Final    Comment: (NOTE) SARS-CoV-2 target nucleic acids are NOT DETECTED.  The SARS-CoV-2 RNA is generally detectable in upper respiratory specimens during the acute phase of infection. The lowest concentration of SARS-CoV-2 viral copies this assay can detect is 138 copies/mL. A negative result does not preclude SARS-Cov-2 infection and should not be used as the sole basis for treatment or other patient management decisions. A negative result may occur with  improper specimen collection/handling, submission of specimen other than nasopharyngeal swab, presence of viral mutation(s) within the areas targeted by this assay, and inadequate number of viral copies(<138 copies/mL). A negative result must  be combined with clinical observations, patient history, and epidemiological information. The expected result is Negative.  Fact Sheet for Patients:  BloggerCourse.com  Fact Sheet for Healthcare Providers:  SeriousBroker.it  This test is no t yet approved or cleared by the [place]  FDA and  has been authorized for detection and/or diagnosis of SARS-CoV-2 by FDA under an Emergency Use Authorization (EUA). This EUA will remain  in effect (meaning this test can be used) for the duration of the COVID-19 declaration under Section 564(b)(1) of the Act, 21 U.S.C.section 360bbb-3(b)(1), unless the authorization is terminated  or revoked sooner.       Influenza A by PCR NEGATIVE NEGATIVE Final   Influenza B by PCR NEGATIVE NEGATIVE Final    Comment: (NOTE) The Xpert Xpress SARS-CoV-2/FLU/RSV plus assay is intended as  an aid in the diagnosis of influenza from Nasopharyngeal swab specimens and should not be used as a sole basis for treatment. Nasal washings and aspirates are unacceptable for Xpert Xpress SARS-CoV-2/FLU/RSV testing.  Fact Sheet for Patients: BloggerCourse.com  Fact Sheet for Healthcare Providers: SeriousBroker.it  This test is not yet approved or cleared by the [place]  FDA and has been authorized for detection and/or diagnosis of SARS-CoV-2 by FDA under an Emergency Use Authorization (EUA). This EUA will remain in effect (meaning this test can be used) for the duration of the COVID-19 declaration under Section 564(b)(1) of the Act, 21 U.S.C. section 360bbb-3(b)(1), unless the authorization is terminated or revoked.     Resp Syncytial Virus by PCR NEGATIVE NEGATIVE Final    Comment: (NOTE) Fact Sheet for Patients: BloggerCourse.com  Fact Sheet for Healthcare Providers: SeriousBroker.it  This test is not  yet approved or cleared by the [place]  FDA and has been authorized for detection and/or diagnosis of SARS-CoV-2 by FDA under an Emergency Use Authorization (EUA). This EUA will remain in effect (meaning this test can be used) for the duration of the COVID-19 declaration under Section 564(b)(1) of the Act, 21 U.S.C. section 360bbb-3(b)(1), unless the authorization is terminated or revoked.  Performed at [facility], [address] [place], [place] [address]   Respiratory (~20 pathogens) panel by PCR     Status: None   Collection Time: 09/26/23  8:30 AM   Specimen: Nasopharyngeal Swab; Respiratory  Result Value Ref Range Status   Adenovirus NOT DETECTED NOT DETECTED Final   Coronavirus 229E NOT DETECTED NOT DETECTED Final    Comment: (NOTE) The Coronavirus on the Respiratory Panel, DOES NOT test for the novel  Coronavirus (2019 nCoV)    Coronavirus HKU1 NOT DETECTED NOT DETECTED Final   Coronavirus NL63 NOT DETECTED NOT DETECTED Final   Coronavirus OC43 NOT DETECTED NOT DETECTED Final   Metapneumovirus NOT DETECTED NOT DETECTED Final   Rhinovirus / Enterovirus NOT DETECTED NOT DETECTED Final   Influenza A NOT DETECTED NOT DETECTED Final   Influenza B NOT DETECTED NOT DETECTED Final   Parainfluenza Virus 1 NOT DETECTED NOT DETECTED Final   Parainfluenza Virus 2 NOT DETECTED NOT DETECTED Final   Parainfluenza Virus 3 NOT DETECTED NOT DETECTED Final   Parainfluenza Virus 4 NOT DETECTED NOT DETECTED Final   Respiratory Syncytial Virus NOT DETECTED NOT DETECTED Final   Bordetella pertussis NOT DETECTED NOT DETECTED Final   Bordetella Parapertussis NOT DETECTED NOT DETECTED Final   Chlamydophila pneumoniae NOT DETECTED NOT DETECTED Final   Mycoplasma pneumoniae NOT DETECTED NOT DETECTED Final    Comment: Performed at [facility], [address] [place], [place] [address]  MRSA Next Gen by PCR, Nasal     Status: None   Collection Time: 09/27/23 11:59 AM   Specimen: Nasal  Mucosa; Nasal Swab  Result Value Ref Range Status   MRSA by PCR Next Gen NOT DETECTED NOT DETECTED Final    Comment: (NOTE) The GeneXpert MRSA Assay (FDA approved for NASAL specimens only), is one component of a comprehensive MRSA colonization surveillance program. It is not intended to diagnose MRSA infection nor to guide or monitor treatment for MRSA infections. Test performance is not FDA approved in patients less than 58 years old. Performed at [facility], [address] [place], [place] [address]   Culture, blood (Routine X 2) w Reflex to ID Panel     Status: Abnormal   Collection Time: 09/29/23 12:35 PM  Specimen: BLOOD LEFT HAND  Result Value Ref Range Status   Specimen Description   Final    BLOOD LEFT HAND BOTTLES DRAWN AEROBIC AND ANAEROBIC Performed at [facility], [address], [place], [place] [address]    Special Requests   Final    Blood Culture adequate volume Performed at [facility], [address] [place], [place] [address]    Culture  Setup Time   Final    IN BOTH AEROBIC AND ANAEROBIC BOTTLES GRAM POSITIVE COCCI Gram Stain Report Called to,Read Back By and Verified With: A RAY RN9/22/25 @1006  BY J. WHITE  GRAM STAIN REVIEWED-AGREE WITH RESULT DRT    Culture STAPHYLOCOCCUS AUREUS (A)  Final   Report Status 10/02/2023 FINAL  Final   Organism ID, Bacteria STAPHYLOCOCCUS AUREUS  Final      Susceptibility   Staphylococcus aureus - MIC*    CIPROFLOXACIN  <=0.5 SENSITIVE Sensitive     ERYTHROMYCIN >=8 RESISTANT Resistant     GENTAMICIN <=0.5 SENSITIVE Sensitive     OXACILLIN <=0.25 SENSITIVE Sensitive     TETRACYCLINE <=1 SENSITIVE Sensitive     VANCOMYCIN  <=0.5 SENSITIVE Sensitive     TRIMETH/SULFA <=10 SENSITIVE Sensitive     CLINDAMYCIN RESISTANT Resistant     RIFAMPIN <=0.5 SENSITIVE Sensitive     Inducible Clindamycin POSITIVE Resistant     LINEZOLID 2 SENSITIVE Sensitive     * STAPHYLOCOCCUS AUREUS  Blood Culture ID Panel (Reflexed)     Status:  Abnormal   Collection Time: 09/29/23 12:35 PM  Result Value Ref Range Status   Enterococcus faecalis NOT DETECTED NOT DETECTED Final   Enterococcus Faecium NOT DETECTED NOT DETECTED Final   Listeria monocytogenes NOT DETECTED NOT DETECTED Final   Staphylococcus species DETECTED (A) NOT DETECTED Final    Comment: CRITICAL RESULT CALLED TO, READ BACK BY AND VERIFIED WITH: PHARMD LORI POOLE ON 09/30/23 @ 1430 BY DRT    Staphylococcus aureus (BCID) DETECTED (A) NOT DETECTED Final    Comment: CRITICAL RESULT CALLED TO, READ BACK BY AND VERIFIED WITH: PHARMD LORI POOLE ON 09/30/23 @ 1430 BY DRT    Staphylococcus epidermidis NOT DETECTED NOT DETECTED Final   Staphylococcus lugdunensis NOT DETECTED NOT DETECTED Final   Streptococcus species NOT DETECTED NOT DETECTED Final   Streptococcus agalactiae NOT DETECTED NOT DETECTED Final   Streptococcus pneumoniae NOT DETECTED NOT DETECTED Final   Streptococcus pyogenes NOT DETECTED NOT DETECTED Final   A.calcoaceticus-baumannii NOT DETECTED NOT DETECTED Final   Bacteroides fragilis NOT DETECTED NOT DETECTED Final   Enterobacterales NOT DETECTED NOT DETECTED Final   Enterobacter cloacae complex NOT DETECTED NOT DETECTED Final   Escherichia coli NOT DETECTED NOT DETECTED Final   Klebsiella aerogenes NOT DETECTED NOT DETECTED Final   Klebsiella oxytoca NOT DETECTED NOT DETECTED Final   Klebsiella pneumoniae NOT DETECTED NOT DETECTED Final   Proteus species NOT DETECTED NOT DETECTED Final   Salmonella species NOT DETECTED NOT DETECTED Final   Serratia marcescens NOT DETECTED NOT DETECTED Final   Haemophilus influenzae NOT DETECTED NOT DETECTED Final   Neisseria meningitidis NOT DETECTED NOT DETECTED Final   Pseudomonas aeruginosa NOT DETECTED NOT DETECTED Final   Stenotrophomonas maltophilia NOT DETECTED NOT DETECTED Final   Candida albicans NOT DETECTED NOT DETECTED Final   Candida auris NOT DETECTED NOT DETECTED Final   Candida glabrata NOT  DETECTED NOT DETECTED Final   Candida krusei NOT DETECTED NOT DETECTED Final   Candida parapsilosis NOT DETECTED NOT DETECTED Final   Candida tropicalis  NOT DETECTED NOT DETECTED Final   Cryptococcus neoformans/gattii NOT DETECTED NOT DETECTED Final   Meth resistant mecA/C and MREJ NOT DETECTED NOT DETECTED Final    Comment: Performed at [facility], [address] [place], [place] [address]  Culture, blood (Routine X 2) w Reflex to ID Panel     Status: Abnormal   Collection Time: 09/29/23 12:50 PM   Specimen: BLOOD RIGHT HAND  Result Value Ref Range Status   Specimen Description   Final    BLOOD RIGHT HAND BOTTLES DRAWN AEROBIC AND ANAEROBIC Performed at [facility], [address] [place], [place] [address]    Special Requests   Final    Blood Culture adequate volume Performed at [facility], [address], [place], [place] [address]    Culture  Setup Time   Final    AEROBIC BOTTLE ONLY GRAM POSITIVE COCCI Gram Stain Report Called to,Read Back By and Verified With: A RAY RN 09/30/23 @1010  BY J. WHITE GRAM STAIN REVIEWED-AGREE WITH RESULT DRT    Culture (A)  Final    STAPHYLOCOCCUS AUREUS SUSCEPTIBILITIES PERFORMED ON PREVIOUS CULTURE WITHIN THE LAST 5 DAYS. Performed at [facility], [address] [place], [place] [address]    Report Status 10/02/2023 FINAL  Final  Culture, blood (Routine X 2) w Reflex to ID Panel     Status: None (Preliminary result)   Collection Time: 10/01/23  4:02 AM   Specimen: BLOOD LEFT ARM  Result Value Ref Range Status   Specimen Description BLOOD LEFT ARM  Final   Special Requests   Final    BOTTLES DRAWN AEROBIC AND ANAEROBIC Blood Culture adequate volume   Culture   Final    NO GROWTH 3 DAYS Performed at [facility], [address], [place], [place] [address]    Report Status PENDING  Incomplete  Culture, blood (Routine X 2) w Reflex to ID Panel     Status: None (Preliminary result)   Collection Time: 10/01/23  4:05 AM    Specimen: BLOOD LEFT HAND  Result Value Ref Range Status   Specimen Description BLOOD LEFT HAND  Final   Special Requests   Final    BOTTLES DRAWN AEROBIC AND ANAEROBIC Blood Culture adequate volume   Culture   Final    NO GROWTH 3 DAYS Performed at [facility], [address], [place], [place] [address]    Report Status PENDING  Incomplete         Radiology Studies: US  EKG SITE RITE Result Date: 10/03/2023 If Site Rite image not attached, placement could not be confirmed due to current cardiac rhythm.       Scheduled Meds:  Chlorhexidine  Gluconate Cloth  6 each Topical Q0600   diltiazem   240 mg Oral Daily   hydrocortisone   20 mg Oral BID   lactulose   30 g Oral TID   levalbuterol   0.63 mg Nebulization BID   levothyroxine   125 mcg Oral Q0600   magic mouthwash  10 mL Oral TID   metoprolol  tartrate  50 mg Oral BID   nystatin    Topical BID   potassium chloride   40 mEq Oral Q4H   thiamine   100 mg Oral Daily   Continuous Infusions:   ceFAZolin  (ANCEF ) IV 2 g (10/04/23 0513)   potassium chloride  10 mEq (10/04/23 0917)     LOS: 8 days    Time spent: 55 minutes    Abhijot Straughter JONETTA Fairly, DO [facility]  If 7PM-7AM, please  contact night-coverage [email or web address] 10/04/2023, 10:04 AM

## 2023-10-04 NOTE — Plan of Care (Signed)

## 2023-10-05 DIAGNOSIS — R278 Other lack of coordination: Secondary | ICD-10-CM | POA: Diagnosis not present

## 2023-10-05 DIAGNOSIS — E876 Hypokalemia: Secondary | ICD-10-CM | POA: Diagnosis not present

## 2023-10-05 DIAGNOSIS — M25571 Pain in right ankle and joints of right foot: Secondary | ICD-10-CM | POA: Diagnosis not present

## 2023-10-05 DIAGNOSIS — E871 Hypo-osmolality and hyponatremia: Secondary | ICD-10-CM | POA: Diagnosis not present

## 2023-10-05 DIAGNOSIS — R531 Weakness: Secondary | ICD-10-CM | POA: Diagnosis not present

## 2023-10-05 DIAGNOSIS — M6281 Muscle weakness (generalized): Secondary | ICD-10-CM | POA: Diagnosis not present

## 2023-10-05 DIAGNOSIS — Z743 Need for continuous supervision: Secondary | ICD-10-CM | POA: Diagnosis not present

## 2023-10-05 DIAGNOSIS — G9341 Metabolic encephalopathy: Secondary | ICD-10-CM | POA: Diagnosis not present

## 2023-10-05 DIAGNOSIS — K7682 Hepatic encephalopathy: Secondary | ICD-10-CM | POA: Diagnosis not present

## 2023-10-05 DIAGNOSIS — A4101 Sepsis due to Methicillin susceptible Staphylococcus aureus: Secondary | ICD-10-CM | POA: Diagnosis not present

## 2023-10-05 DIAGNOSIS — R2689 Other abnormalities of gait and mobility: Secondary | ICD-10-CM | POA: Diagnosis not present

## 2023-10-05 LAB — COMPREHENSIVE METABOLIC PANEL WITH GFR
ALT: 12 U/L (ref 0–44)
AST: 62 U/L — ABNORMAL HIGH (ref 15–41)
Albumin: 2.4 g/dL — ABNORMAL LOW (ref 3.5–5.0)
Alkaline Phosphatase: 98 U/L (ref 38–126)
Anion gap: 6 (ref 5–15)
BUN: 16 mg/dL (ref 6–20)
CO2: 21 mmol/L — ABNORMAL LOW (ref 22–32)
Calcium: 8.5 mg/dL — ABNORMAL LOW (ref 8.9–10.3)
Chloride: 106 mmol/L (ref 98–111)
Creatinine, Ser: 0.79 mg/dL (ref 0.61–1.24)
GFR, Estimated: >60 mL/min (ref >60)
Glucose, Bld: 132 mg/dL — ABNORMAL HIGH (ref 70–99)
Potassium: 3.5 mmol/L (ref 3.5–5.1)
Sodium: 133 mmol/L — ABNORMAL LOW (ref 135–145)
Total Bilirubin: 2.8 mg/dL — ABNORMAL HIGH (ref 0.0–1.2)
Total Protein: 6.7 g/dL (ref 6.5–8.1)

## 2023-10-05 LAB — CBC
HCT: 36.3 % — ABNORMAL LOW (ref 39.0–52.0)
Hemoglobin: 12 g/dL — ABNORMAL LOW (ref 13.0–17.0)
MCH: 30.8 pg (ref 26.0–34.0)
MCHC: 33.1 g/dL (ref 30.0–36.0)
MCV: 93.1 fL (ref 80.0–100.0)
Platelets: 46 K/uL — ABNORMAL LOW (ref 150–400)
RBC: 3.9 MIL/uL — ABNORMAL LOW (ref 4.22–5.81)
RDW: 16.9 % — ABNORMAL HIGH (ref 11.5–15.5)
WBC: 5.5 K/uL (ref 4.0–10.5)
nRBC: 0 % (ref 0.0–0.2)

## 2023-10-05 LAB — MAGNESIUM: Magnesium: 2.2 mg/dL (ref 1.7–2.4)

## 2023-10-05 MED ORDER — HYDROCORTISONE 20 MG PO TABS: 20.0000 mg | ORAL_TABLET | Freq: Two times a day (BID) | ORAL | 0 refills | Status: DC

## 2023-10-05 MED ORDER — CEFAZOLIN IV (FOR PTA / DISCHARGE USE ONLY): 2.0000 g | Freq: Three times a day (TID) | INTRAVENOUS | 0 refills | Status: DC

## 2023-10-05 MED ORDER — LEVOTHYROXINE SODIUM 125 MCG PO TABS: 125.0000 ug | ORAL_TABLET | Freq: Every day | ORAL | 0 refills | Status: DC

## 2023-10-05 NOTE — Discharge Summary (Signed)
 Physician Discharge Summary  [name] FMW:[id number] DOB: 03-25-1965 DOA: 09/25/2023  PCP: Carleen Dallas PARAS, FNP  Admit date: 09/25/2023  Discharge date: 10/05/2023  Admitted From:SNF  Disposition:  SNF  Recommendations for Outpatient Follow-up:  Follow up with PCP in 1-2 weeks Continue cefazolin  for MSSA bacteremia with EOT 11/12/23.  Follow-up with video visit with ID on 10/31/2023 at 9:30 AM Continue other medications as noted below with discontinuation of sedating agents such as gabapentin  and BuSpar  and Cymbalta  Continue levothyroxine  for myxedema/hypothyroidism Continue oral Cortef  for relative adrenal insufficiency  Home Health: None  Equipment/Devices: PICC line placed 9/26  Discharge Condition:Stable  CODE STATUS: Full  Diet recommendation: Heart Healthy, continue 1200 mL fluid restriction  Brief/Interim Summary: 58 year old male with a history of HFpEF, paroxysmal atrial fibrillation, alcoholic liver cirrhosis, portal hypertension with esophageal varices, thrombocytopenia, COPD, OSA, portal/splenic/mesenteric vein thrombosis, chronic back pain obesity presented from [facility] for abnormal labs. Patient was admitted with acute metabolic encephalopathy/hepatic encephalopathy and is now less encephalopathic and stable for discharge.  He is also more interactive and has not had a sitter at bedside over the last 48 hours. He does not have capacity to make decisions and has emergency guardianship. He was also noted to have MSSA bacteremia and cannot have TEE due to esophageal varices and issues with thrombocytopenia. He will be treated for presumptive endocarditis with placement of PICC line and need for 6-week course of IV antibiotics per ID recommendations.  He will remain off of sedating agents and was noted to have some electrolyte abnormalities to include hyponatremia and hypokalemia and this was likely related to relative adrenal insufficiency as well as myxedema.   He continues to remain confused, but is no longer agitated or having significant behavioral disturbances.  No other acute events or concerns noted.  Discharge Diagnoses:  Principal Problem:   Acute metabolic encephalopathy Active Problems:   Obstructive sleep apnea of adult   Hypertension, essential   Atrial fibrillation (HCC)   Cirrhosis (HCC)   Thrombocytopenia   Hypokalemia   CHF (congestive heart failure) (HCC)   Esophageal varices (HCC)   Paroxysmal atrial fibrillation with RVR (HCC)   Hepatic encephalopathy (HCC)   Gram-positive bacteremia   Staphylococcus aureus bacteremia  Principal discharge diagnosis: Multifactorial acute metabolic encephalopathy including hepatic encephalopathy, myxedema, and Wernicke/Korsakoff.  MSSA bacteremia.  Discharge Instructions  Discharge Instructions     [facility] pharmacist to adjust dose for Vancomycin , Aminoglycosides and other anti-infective therapies as requested by physician.   Complete by: As directed    [facility] to provide Cath Flo 2mg    Complete by: As directed    Administer for PICC line occlusion and as ordered by physician for other access device issues.   Anaphylaxis Kit: Provided to treat any anaphylactic reaction to the medication being provided to the patient if First Dose or when requested by physician   Complete by: As directed    Epinephrine 1mg /ml vial / amp: Administer 0.3mg  (0.11ml) subcutaneously once for moderate to severe anaphylaxis, nurse to call physician and pharmacy when reaction occurs and call 911 if needed for immediate care   Diphenhydramine  50mg /ml IV vial: Administer 25-50mg  IV/IM PRN for first dose reaction, rash, itching, mild reaction, nurse to call physician and pharmacy when reaction occurs   Sodium Chloride  0.9% NS 500ml IV: Administer if needed for hypovolemic blood pressure drop or as ordered by physician after call to physician with anaphylactic reaction   Change dressing on  IV access line  weekly and PRN   Complete by: As directed    Diet - low sodium heart healthy   Complete by: As directed    Flush IV access with Sodium Chloride  0.9% and Heparin  10 units/ml or 100 units/ml   Complete by: As directed    Home infusion instructions - [facility]   Complete by: As directed    Instructions: Flush IV access with Sodium Chloride  0.9% and Heparin  10units/ml or 100units/ml   Change dressing on IV access line: Weekly and PRN   Instructions Cath Flo 2mg : Administer for PICC Line occlusion and as ordered by physician for other access device   [facility] pharmacist to adjust dose for: Vancomycin , Aminoglycosides and other anti-infective therapies as requested by physician   Increase activity slowly   Complete by: As directed    Method of administration may be changed at the discretion of home infusion pharmacist based upon assessment of the patient and/or caregiver's ability to self-administer the medication ordered   Complete by: As directed    No wound care   Complete by: As directed       Allergies as of 10/05/2023       Reactions   Allopurinol  Other (See Comments)   Joint pain worse Caused worsening gout   Fluoxetine Hcl Itching        Medication List     STOP taking these medications    busPIRone  15 MG tablet Commonly known as: BUSPAR    DULoxetine  60 MG capsule Commonly known as: CYMBALTA    gabapentin  100 MG capsule Commonly known as: NEURONTIN        TAKE these medications    albuterol  108 (90 Base) MCG/ACT inhaler Commonly known as: VENTOLIN  HFA INHALE 2 PUFFS BY MOUTH EVERY 6 HOURS AS NEEDED FOR SHORTNESS OF BREATH OR WHEEZING What changed: See the new instructions.   aspirin  EC 325 MG tablet Take 162.5 mg by mouth every 6 (six) hours as needed (anticoagulant).   ceFAZolin  IVPB Commonly known as: ANCEF  Inject 2 g into the vein every 8 (eight) hours. Indication:  MSSA bacteremia First Dose: Yes Last Day of  Therapy:  11/12/23 Labs - Once weekly:  CBC/D and CMP Pull PICC line at the completion of IV antibiotic therapy Method of administration: IV Push or per SNF protocol Method of administration may be changed at the discretion of home infusion pharmacist based upon assessment of the patient and/or caregiver's ability to self-administer the medication ordered.   Constulose  10 GM/15ML solution Generic drug: lactulose  TAKE 30 ML BY MOUTH TWICE DAILY What changed: See the new instructions.   cyanocobalamin  500 MCG tablet Commonly known as: V-R VITAMIN B-12 Take 1 tablet (500 mcg total) by mouth every Monday, Wednesday, and Friday. What changed: when to take this   diltiazem  120 MG 24 hr capsule Commonly known as: CARDIZEM  CD Take 240 mg by mouth daily.   ezetimibe  10 MG tablet Commonly known as: ZETIA  Take 1 tablet (10 mg total) by mouth daily.   fluticasone  50 MCG/ACT nasal spray Commonly known as: FLONASE  USE 2 SPRAYS IN EACH NOSTRIL EVERY DAY AS NEEDED What changed: See the new instructions.   folic acid  800 MCG tablet Commonly known as: FOLVITE  Take 1 tablet (800 mcg total) by mouth daily.   hydrocortisone  20 MG tablet Commonly known as: CORTEF  Take 1 tablet (20 mg total) by mouth 2 (two) times daily.   levothyroxine  125 MCG tablet Commonly known as: SYNTHROID  Take 1 tablet (125 mcg total) by  mouth daily at 6 (six) AM. Start taking on: October 06, 2023   metoprolol  succinate 100 MG 24 hr tablet Commonly known as: TOPROL -XL Take 100 mg by mouth in the morning and at bedtime.   midodrine 5 MG tablet Commonly known as: PROAMATINE Take 5 mg by mouth 2 (two) times daily with a meal.   OneTouch Delica Lancets 30G Misc Test BS QID Dx E11.9   pantoprazole  40 MG tablet Commonly known as: PROTONIX  TAKE 1 TABLET BY MOUTH TWICE DAILY What changed: when to take this   rosuvastatin 5 MG tablet Commonly known as: CRESTOR Take 5 mg by mouth daily.   spironolactone  50 MG  tablet Commonly known as: ALDACTONE  Take 1 tablet (50 mg total) by mouth daily.   thiamine  100 MG tablet Commonly known as: VITAMIN B1 Take 100 mg by mouth daily.   torsemide  20 MG tablet Commonly known as: DEMADEX  Take 20 mg by mouth daily.   ZINC OXIDE (TOPICAL) 10 % Crea Apply 1 Application topically in the morning and at bedtime. Apply to reddened area to groin/scrotum for MASD               Discharge Care Instructions  (From admission, onward)           Start     Ordered   10/05/23 0000  Change dressing on IV access line weekly and PRN  (Home infusion instructions - [facility] )        10/05/23 0921            Contact information for after-discharge care     Destination     [facility] .   Service: Skilled Nursing Contact information: [address] [place]  [address] [phone]                    Allergies  Allergen Reactions   Allopurinol  Other (See Comments)    Joint pain worse Caused worsening gout   Fluoxetine Hcl Itching    Consultations: ID   Procedures/Studies: DG Chest Portable 1 View Addendum Date: 10/04/2023 ADDENDUM REPORT: 10/04/2023 17:43 ADDENDUM: Upon further evaluation, a left-sided PICC line is seen. Its distal end is poorly visualized and appears to be located within the superior vena cava, just beyond the junction of the superior vena cava and left brachiocephalic vein. Electronically Signed   By: Suzen Dials M.D.   On: 10/04/2023 17:43   Result Date: 10/04/2023 CLINICAL DATA:  PICC line placement. EXAM: PORTABLE CHEST 1 VIEW COMPARISON:  September 29, 2023 FINDINGS: The heart size and mediastinal contours are within normal limits. Low lung volumes are noted. There is prominence of the central pulmonary vasculature. Mild bilateral perihilar linear scarring and/or atelectasis is seen. No pleural effusion or pneumothorax is identified. The  visualized skeletal structures are unremarkable. IMPRESSION: Low lung volumes with vascular congestion and with mild bilateral perihilar linear scarring and/or atelectasis. Electronically Signed: By: Suzen Dials M.D. On: 10/04/2023 17:03   US  EKG SITE RITE Result Date: 10/03/2023 If Site Rite image not attached, placement could not be confirmed due to current cardiac rhythm.  ECHOCARDIOGRAM COMPLETE Result Date: 10/02/2023    ECHOCARDIOGRAM REPORT   Patient Name:   JABER DUNLOW Braaten Date of Exam: 10/01/2023 Medical Rec #:  [id number]        Height:       72.0 in Accession #:    [id number]       Weight:  352.5 lb Date of Birth:  18-May-1965        BSA:          2.711 m Patient Age:    58 years         BP:           145/103 mmHg Patient Gender: M                HR:           75 bpm. Exam Location:  [facility] Procedure: 2D Echo, Cardiac Doppler, Color Doppler and Intracardiac            Opacification Agent (Both Spectral and Color Flow Doppler were            utilized during procedure). Indications:     Bacteremia R78.81  History:         Patient has prior history of Echocardiogram examinations, most                  recent 04/22/2019. CHF, Arrythmias:Atrial Fibrillation; Risk                  Factors:Hypertension, Diabetes, Dyslipidemia, Non-Smoker and                  Sleep Apnea. Hx of ETOH abuse and morbid obesity.  Sonographer:     Aida Pizza RCS Referring Phys:  [phone] DAVID TAT Diagnosing Phys: Jayson Sierras MD  Sonographer Comments: Suboptimal apical window, suboptimal subcostal window and Technically difficult study due to poor echo windows. Image acquisition challenging due to patient body habitus. IMPRESSIONS  1. Images are limited.  2. Left ventricular ejection fraction, by estimation, is 70 to 75%. The left ventricle has hyperdynamic function. The left ventricle has no regional wall motion abnormalities. There is moderate asymmetric left ventricular hypertrophy of the basal segment. Left  ventricular diastolic parameters are indeterminate.  3. Right ventricular systolic function is normal. The right ventricular size is mildly enlarged. Tricuspid regurgitation signal is inadequate for assessing PA pressure.  4. Left atrial size was severely dilated.  5. The mitral valve is grossly normal. Trivial mitral valve regurgitation.  6. The aortic valve is tricuspid. Aortic valve regurgitation is trivial. Probable lambl's excrescences at leafet tips.  7. Aortic dilatation noted. There is borderline dilatation of the aortic root, measuring 40 mm.  8. Unable to estimate CVP. Comparison(s): Prior images reviewed side by side. No diagnostic valvular vegetations, consider TEE if high suspicion of endocarditis and otherwise clinically appropriate risk. FINDINGS  Left Ventricle: Left ventricular ejection fraction, by estimation, is 70 to 75%. The left ventricle has hyperdynamic function. The left ventricle has no regional wall motion abnormalities. Definity  contrast agent was given IV to delineate the left ventricular endocardial borders. The left ventricular internal cavity size was normal in size. There is moderate asymmetric left ventricular hypertrophy of the basal segment. Left ventricular diastolic function could not be evaluated due to atrial fibrillation. Left ventricular diastolic parameters are indeterminate. Right Ventricle: The right ventricular size is mildly enlarged. No increase in right ventricular wall thickness. Right ventricular systolic function is normal. Tricuspid regurgitation signal is inadequate for assessing PA pressure. Left Atrium: Left atrial size was severely dilated. Right Atrium: Right atrial size was normal in size. Pericardium: There is no evidence of pericardial effusion. Presence of epicardial fat layer. Mitral Valve: The mitral valve is grossly normal. Trivial mitral valve regurgitation. Tricuspid Valve: The tricuspid valve is grossly normal. Tricuspid valve regurgitation is  trivial. Aortic Valve: The aortic valve is tricuspid. Aortic valve regurgitation is trivial. Pulmonic Valve: The pulmonic valve was grossly normal. Pulmonic valve regurgitation is trivial. Aorta: Aortic dilatation noted. There is borderline dilatation of the aortic root, measuring 40 mm. Venous: Unable to estimate CVP. IAS/Shunts: No atrial level shunt detected by color flow Doppler. Additional Comments: 3D was performed not requiring image post processing on an independent workstation and was indeterminate.  LEFT VENTRICLE PLAX 2D LVIDd:         5.30 cm   Diastology LVIDs:         2.30 cm   LV e' medial:    9.57 cm/s LV PW:         1.40 cm   LV E/e' medial:  9.2 LV IVS:        1.20 cm   LV e' lateral:   9.32 cm/s LVOT diam:     1.90 cm   LV E/e' lateral: 9.5 LV SV:         48 LV SV Index:   18 LVOT Area:     2.84 cm  RIGHT VENTRICLE RV S prime:     18.00 cm/s TAPSE (M-mode): 1.6 cm LEFT ATRIUM              Index        RIGHT ATRIUM           Index LA diam:        5.30 cm  1.96 cm/m   RA Area:     20.00 cm LA Vol (A2C):   116.0 ml 42.79 ml/m  RA Volume:   59.40 ml  21.91 ml/m LA Vol (A4C):   193.0 ml 71.20 ml/m LA Biplane Vol: 163.0 ml 60.13 ml/m  AORTIC VALVE LVOT Vmax:   82.90 cm/s LVOT Vmean:  51.850 cm/s LVOT VTI:    0.168 m  AORTA Ao Root diam: 4.00 cm MITRAL VALVE MV Area (PHT): 2.24 cm    SHUNTS MV Decel Time: 338 msec    Systemic VTI:  0.17 m MV E velocity: 88.20 cm/s  Systemic Diam: 1.90 cm Jayson Sierras MD Electronically signed by Jayson Sierras MD Signature Date/Time: 10/01/2023/3:24:46 PM    Final (Updated)    DG CHEST PORT 1 VIEW Result Date: 09/29/2023 CLINICAL DATA:  In the sarcoid. EXAM: PORTABLE CHEST 1 VIEW COMPARISON:  09/25/2023 FINDINGS: Low volume film. The cardio pericardial silhouette is enlarged. Vascular congestion with diffuse interstitial opacity suggests edema. There is patchy airspace disease in the right suprahilar region and left base. No substantial pleural effusion.  Telemetry leads overlie the chest. IMPRESSION: 1. Low volume film with vascular congestion and diffuse interstitial opacity suggesting edema. 2. Patchy airspace disease in the right suprahilar region and left base. Findings may reflect asymmetric edema or infection. Electronically Signed   By: Camellia Candle M.D.   On: 09/29/2023 07:08   CT HEAD WO CONTRAST ( ) Result Date: 09/26/2023 CLINICAL DATA:  Mental status change, unknown cause. EXAM: CT HEAD WITHOUT CONTRAST TECHNIQUE: Contiguous axial images were obtained from the base of the skull through the vertex without intravenous contrast. RADIATION DOSE REDUCTION: This exam was performed according to the departmental dose-optimization program which includes automated exposure control, adjustment of the mA and/or kV according to patient size and/or use of iterative reconstruction technique. COMPARISON:  Head CT 04/27/2003 FINDINGS: The study is mildly motion degraded. Brain: There is no evidence of an acute infarct, intracranial hemorrhage, mass, midline shift, or extra-axial fluid collection.  Cerebral volume is within normal limits for age. The ventricles are normal in size. Vascular: Calcified atherosclerosis at the skull base. No hyperdense vessel. Skull: No fracture or suspicious lesion. Sinuses/Orbits: Subtotal opacification of the left sphenoid sinus. Mild mucosal thickening in the left maxillary sinus. Small right mastoid effusion. Unremarkable orbits. Other: None. IMPRESSION: No evidence of acute intracranial abnormality. Electronically Signed   By: Dasie Hamburg M.D.   On: 09/26/2023 08:58   DG Chest 1 View Result Date: 09/25/2023 CLINICAL DATA:  Shortness of breath. EXAM: CHEST  1 VIEW COMPARISON:  February 20, 2022. FINDINGS: The heart size and mediastinal contours are within normal limits. Hypoinflation of the lungs is noted. Both lungs are clear. The visualized skeletal structures are unremarkable. IMPRESSION: No active disease. Electronically  Signed   By: Lynwood Landy Raddle M.D.   On: 09/25/2023 16:09     Discharge Exam: Vitals:   10/05/23 0450 10/05/23 0809  BP: 121/85   Pulse: 61   Resp: 16   Temp: (!) 97.4 F (36.3 C)   SpO2: (!) 84% 95%   Vitals:   10/04/23 2017 10/04/23 2044 10/05/23 0450 10/05/23 0809  BP:  129/84 121/85   Pulse:  (!) 57 61   Resp:  16 16   Temp:  97.8 F (36.6 C) (!) 97.4 F (36.3 C)   TempSrc:  Oral Oral   SpO2: 94% 99% (!) 84% 95%  Weight:      Height:        General: Pt is alert, awake, not in acute distress Cardiovascular: RRR, S1/S2 +, no rubs, no gallops Respiratory: CTA bilaterally, no wheezing, no rhonchi Abdominal: Soft, NT, ND, bowel sounds + Extremities: no edema, no cyanosis    The results of significant diagnostics from this hospitalization (including imaging, microbiology, ancillary and laboratory) are listed below for reference.     Microbiology: Recent Results (from the past 240 hours)  Resp panel by RT-PCR (RSV, Flu A&B, Covid) Anterior Nasal Swab     Status: None   Collection Time: 09/26/23  8:30 AM   Specimen: Anterior Nasal Swab  Result Value Ref Range Status   SARS Coronavirus 2 by RT PCR NEGATIVE NEGATIVE Final    Comment: (NOTE) SARS-CoV-2 target nucleic acids are NOT DETECTED.  The SARS-CoV-2 RNA is generally detectable in upper respiratory specimens during the acute phase of infection. The lowest concentration of SARS-CoV-2 viral copies this assay can detect is 138 copies/mL. A negative result does not preclude SARS-Cov-2 infection and should not be used as the sole basis for treatment or other patient management decisions. A negative result may occur with  improper specimen collection/handling, submission of specimen other than nasopharyngeal swab, presence of viral mutation(s) within the areas targeted by this assay, and inadequate number of viral copies(<138 copies/mL). A negative result must be combined with clinical observations, patient history,  and epidemiological information. The expected result is Negative.  Fact Sheet for Patients:  BloggerCourse.com  Fact Sheet for Healthcare Providers:  SeriousBroker.it  This test is no t yet approved or cleared by the [place]  FDA and  has been authorized for detection and/or diagnosis of SARS-CoV-2 by FDA under an Emergency Use Authorization (EUA). This EUA will remain  in effect (meaning this test can be used) for the duration of the COVID-19 declaration under Section 564(b)(1) of the Act, 21 U.S.C.section 360bbb-3(b)(1), unless the authorization is terminated  or revoked sooner.       Influenza A by PCR NEGATIVE NEGATIVE Final  Influenza B by PCR NEGATIVE NEGATIVE Final    Comment: (NOTE) The Xpert Xpress SARS-CoV-2/FLU/RSV plus assay is intended as an aid in the diagnosis of influenza from Nasopharyngeal swab specimens and should not be used as a sole basis for treatment. Nasal washings and aspirates are unacceptable for Xpert Xpress SARS-CoV-2/FLU/RSV testing.  Fact Sheet for Patients: BloggerCourse.com  Fact Sheet for Healthcare Providers: SeriousBroker.it  This test is not yet approved or cleared by the [place]  FDA and has been authorized for detection and/or diagnosis of SARS-CoV-2 by FDA under an Emergency Use Authorization (EUA). This EUA will remain in effect (meaning this test can be used) for the duration of the COVID-19 declaration under Section 564(b)(1) of the Act, 21 U.S.C. section 360bbb-3(b)(1), unless the authorization is terminated or revoked.     Resp Syncytial Virus by PCR NEGATIVE NEGATIVE Final    Comment: (NOTE) Fact Sheet for Patients: BloggerCourse.com  Fact Sheet for Healthcare Providers: SeriousBroker.it  This test is not yet approved or cleared by the [place]  FDA and has  been authorized for detection and/or diagnosis of SARS-CoV-2 by FDA under an Emergency Use Authorization (EUA). This EUA will remain in effect (meaning this test can be used) for the duration of the COVID-19 declaration under Section 564(b)(1) of the Act, 21 U.S.C. section 360bbb-3(b)(1), unless the authorization is terminated or revoked.  Performed at [facility], [address] [place], [place] [address]   Respiratory (~20 pathogens) panel by PCR     Status: None   Collection Time: 09/26/23  8:30 AM   Specimen: Nasopharyngeal Swab; Respiratory  Result Value Ref Range Status   Adenovirus NOT DETECTED NOT DETECTED Final   Coronavirus 229E NOT DETECTED NOT DETECTED Final    Comment: (NOTE) The Coronavirus on the Respiratory Panel, DOES NOT test for the novel  Coronavirus (2019 nCoV)    Coronavirus HKU1 NOT DETECTED NOT DETECTED Final   Coronavirus NL63 NOT DETECTED NOT DETECTED Final   Coronavirus OC43 NOT DETECTED NOT DETECTED Final   Metapneumovirus NOT DETECTED NOT DETECTED Final   Rhinovirus / Enterovirus NOT DETECTED NOT DETECTED Final   Influenza A NOT DETECTED NOT DETECTED Final   Influenza B NOT DETECTED NOT DETECTED Final   Parainfluenza Virus 1 NOT DETECTED NOT DETECTED Final   Parainfluenza Virus 2 NOT DETECTED NOT DETECTED Final   Parainfluenza Virus 3 NOT DETECTED NOT DETECTED Final   Parainfluenza Virus 4 NOT DETECTED NOT DETECTED Final   Respiratory Syncytial Virus NOT DETECTED NOT DETECTED Final   Bordetella pertussis NOT DETECTED NOT DETECTED Final   Bordetella Parapertussis NOT DETECTED NOT DETECTED Final   Chlamydophila pneumoniae NOT DETECTED NOT DETECTED Final   Mycoplasma pneumoniae NOT DETECTED NOT DETECTED Final    Comment: Performed at [facility], [address], [place], [place] [address]  MRSA Next Gen by PCR, Nasal     Status: None   Collection Time: 09/27/23 11:59 AM   Specimen: Nasal Mucosa; Nasal Swab  Result Value Ref Range Status   MRSA  by PCR Next Gen NOT DETECTED NOT DETECTED Final    Comment: (NOTE) The GeneXpert MRSA Assay (FDA approved for NASAL specimens only), is one component of a comprehensive MRSA colonization surveillance program. It is not intended to diagnose MRSA infection nor to guide or monitor treatment for MRSA infections. Test performance is not FDA approved in patients less than 97 years old. Performed at [facility], [address] [place], [place] [address]   Culture, blood (  Routine X 2) w Reflex to ID Panel     Status: Abnormal   Collection Time: 09/29/23 12:35 PM   Specimen: BLOOD LEFT HAND  Result Value Ref Range Status   Specimen Description   Final    BLOOD LEFT HAND BOTTLES DRAWN AEROBIC AND ANAEROBIC Performed at [facility], [address], [place], [place] [address]    Special Requests   Final    Blood Culture adequate volume Performed at [facility], [address], [place], [place] [address]    Culture  Setup Time   Final    IN BOTH AEROBIC AND ANAEROBIC BOTTLES GRAM POSITIVE COCCI Gram Stain Report Called to,Read Back By and Verified With: A RAY RN9/22/25 @1006  BY J. WHITE  GRAM STAIN REVIEWED-AGREE WITH RESULT DRT    Culture STAPHYLOCOCCUS AUREUS (A)  Final   Report Status 10/02/2023 FINAL  Final   Organism ID, Bacteria STAPHYLOCOCCUS AUREUS  Final      Susceptibility   Staphylococcus aureus - MIC*    CIPROFLOXACIN  <=0.5 SENSITIVE Sensitive     ERYTHROMYCIN >=8 RESISTANT Resistant     GENTAMICIN <=0.5 SENSITIVE Sensitive     OXACILLIN <=0.25 SENSITIVE Sensitive     TETRACYCLINE <=1 SENSITIVE Sensitive     VANCOMYCIN  <=0.5 SENSITIVE Sensitive     TRIMETH/SULFA <=10 SENSITIVE Sensitive     CLINDAMYCIN RESISTANT Resistant     RIFAMPIN <=0.5 SENSITIVE Sensitive     Inducible Clindamycin POSITIVE Resistant     LINEZOLID 2 SENSITIVE Sensitive     * STAPHYLOCOCCUS AUREUS  Blood Culture ID Panel (Reflexed)     Status: Abnormal   Collection Time: 09/29/23 12:35 PM  Result  Value Ref Range Status   Enterococcus faecalis NOT DETECTED NOT DETECTED Final   Enterococcus Faecium NOT DETECTED NOT DETECTED Final   Listeria monocytogenes NOT DETECTED NOT DETECTED Final   Staphylococcus species DETECTED (A) NOT DETECTED Final    Comment: CRITICAL RESULT CALLED TO, READ BACK BY AND VERIFIED WITH: PHARMD LORI POOLE ON 09/30/23 @ 1430 BY DRT    Staphylococcus aureus (BCID) DETECTED (A) NOT DETECTED Final    Comment: CRITICAL RESULT CALLED TO, READ BACK BY AND VERIFIED WITH: PHARMD LORI POOLE ON 09/30/23 @ 1430 BY DRT    Staphylococcus epidermidis NOT DETECTED NOT DETECTED Final   Staphylococcus lugdunensis NOT DETECTED NOT DETECTED Final   Streptococcus species NOT DETECTED NOT DETECTED Final   Streptococcus agalactiae NOT DETECTED NOT DETECTED Final   Streptococcus pneumoniae NOT DETECTED NOT DETECTED Final   Streptococcus pyogenes NOT DETECTED NOT DETECTED Final   A.calcoaceticus-baumannii NOT DETECTED NOT DETECTED Final   Bacteroides fragilis NOT DETECTED NOT DETECTED Final   Enterobacterales NOT DETECTED NOT DETECTED Final   Enterobacter cloacae complex NOT DETECTED NOT DETECTED Final   Escherichia coli NOT DETECTED NOT DETECTED Final   Klebsiella aerogenes NOT DETECTED NOT DETECTED Final   Klebsiella oxytoca NOT DETECTED NOT DETECTED Final   Klebsiella pneumoniae NOT DETECTED NOT DETECTED Final   Proteus species NOT DETECTED NOT DETECTED Final   Salmonella species NOT DETECTED NOT DETECTED Final   Serratia marcescens NOT DETECTED NOT DETECTED Final   Haemophilus influenzae NOT DETECTED NOT DETECTED Final   Neisseria meningitidis NOT DETECTED NOT DETECTED Final   Pseudomonas aeruginosa NOT DETECTED NOT DETECTED Final   Stenotrophomonas maltophilia NOT DETECTED NOT DETECTED Final   Candida albicans NOT DETECTED NOT DETECTED Final   Candida auris NOT DETECTED NOT DETECTED Final   Candida glabrata NOT DETECTED NOT DETECTED Final  Candida krusei NOT DETECTED NOT  DETECTED Final   Candida parapsilosis NOT DETECTED NOT DETECTED Final   Candida tropicalis NOT DETECTED NOT DETECTED Final   Cryptococcus neoformans/gattii NOT DETECTED NOT DETECTED Final   Meth resistant mecA/C and MREJ NOT DETECTED NOT DETECTED Final    Comment: Performed at [facility], [address] [place], [place] [address]  Culture, blood (Routine X 2) w Reflex to ID Panel     Status: Abnormal   Collection Time: 09/29/23 12:50 PM   Specimen: BLOOD RIGHT HAND  Result Value Ref Range Status   Specimen Description   Final    BLOOD RIGHT HAND BOTTLES DRAWN AEROBIC AND ANAEROBIC Performed at [facility], [address] [place], [place] [address]    Special Requests   Final    Blood Culture adequate volume Performed at [facility], [address] [place], [place] [address]    Culture  Setup Time   Final    AEROBIC BOTTLE ONLY GRAM POSITIVE COCCI Gram Stain Report Called to,Read Back By and Verified With: A RAY RN 09/30/23 @1010  BY J. WHITE GRAM STAIN REVIEWED-AGREE WITH RESULT DRT    Culture (A)  Final    STAPHYLOCOCCUS AUREUS SUSCEPTIBILITIES PERFORMED ON PREVIOUS CULTURE WITHIN THE LAST 5 DAYS. Performed at [facility], [address], [place], [place] [address]    Report Status 10/02/2023 FINAL  Final  Culture, blood (Routine X 2) w Reflex to ID Panel     Status: None (Preliminary result)   Collection Time: 10/01/23  4:02 AM   Specimen: BLOOD LEFT ARM  Result Value Ref Range Status   Specimen Description BLOOD LEFT ARM  Final   Special Requests   Final    BOTTLES DRAWN AEROBIC AND ANAEROBIC Blood Culture adequate volume   Culture   Final    NO GROWTH 4 DAYS Performed at [facility], [address], [place], [place] [address]    Report Status PENDING  Incomplete  Culture, blood (Routine X 2) w Reflex to ID Panel     Status: None (Preliminary result)   Collection Time: 10/01/23  4:05 AM   Specimen: BLOOD LEFT HAND  Result Value Ref Range Status    Specimen Description BLOOD LEFT HAND  Final   Special Requests   Final    BOTTLES DRAWN AEROBIC AND ANAEROBIC Blood Culture adequate volume   Culture   Final    NO GROWTH 4 DAYS Performed at [facility], [address] [place], [place] [address]    Report Status PENDING  Incomplete     Labs: BNP (last 3 results) Recent Labs    09/29/23 0521  BNP 322.0*   Basic Metabolic Panel: Recent Labs  Lab 10/01/23 0405 10/01/23 1000 10/02/23 0846 10/03/23 0417 10/04/23 0454 10/05/23 0441  NA 138  --  133* 130* 133* 133*  K 2.6*  --  3.1* 3.1* 2.9* 3.5  CL 103  --  101 101 103 106  CO2 24  --  23 22 23  21*  GLUCOSE 141*  --  125* 133* 125* 132*  BUN 14  --  16 18 17 16   CREATININE 1.00  --  1.08 1.01 1.01 0.79  CALCIUM 8.4*  --  8.6* 8.5* 8.5* 8.5*  MG  --  2.2 2.1 2.1 2.2 2.2   Liver Function Tests: Recent Labs  Lab 09/30/23 0715 10/01/23 0405 10/02/23 0846 10/04/23 0454 10/05/23 0441  AST 47* 52* 59* 76* 62*  ALT 23  24 21 14 12   ALKPHOS 82 97 95 103 98  BILITOT 3.1* 2.9* 2.7* 3.0* 2.8*  PROT 6.8 7.3 7.0 6.9 6.7  ALBUMIN  2.5* 2.6* 2.5* 2.5* 2.4*   No results for input(s): LIPASE, AMYLASE in the last 168 hours. Recent Labs  Lab 09/29/23 0521 10/01/23 0402  AMMONIA 45* 22   CBC: Recent Labs  Lab 10/01/23 0405 10/02/23 0846 10/03/23 0417 10/04/23 0454 10/05/23 0441  WBC 6.0 5.5 6.1 5.9 5.5  HGB 11.2* 11.5* 11.4* 12.1* 12.0*  HCT 33.9* 34.6* 33.8* 36.4* 36.3*  MCV 93.1 93.0 91.8 92.9 93.1  PLT 55* 46* 56* 57* 46*   Cardiac Enzymes: No results for input(s): CKTOTAL, CKMB, CKMBINDEX, TROPONINI in the last 168 hours. BNP: Invalid input(s): POCBNP CBG: No results for input(s): GLUCAP in the last 168 hours. D-Dimer No results for input(s): DDIMER in the last 72 hours. Hgb A1c No results for input(s): HGBA1C in the last 72 hours. Lipid Profile No results for input(s): CHOL, HDL, LDLCALC, TRIG, CHOLHDL, LDLDIRECT in the last  72 hours. Thyroid  function studies No results for input(s): TSH, T4TOTAL, T3FREE, THYROIDAB in the last 72 hours.  Invalid input(s): FREET3 Anemia work up No results for input(s): VITAMINB12, FOLATE, FERRITIN, TIBC, IRON , RETICCTPCT in the last 72 hours. Urinalysis    Component Value Date/Time   COLORURINE YELLOW 09/26/2023 0844   APPEARANCEUR CLEAR 09/26/2023 0844   APPEARANCEUR Clear 09/29/2018 1020   LABSPEC 1.010 09/26/2023 0844   PHURINE 6.0 09/26/2023 0844   GLUCOSEU NEGATIVE 09/26/2023 0844   HGBUR NEGATIVE 09/26/2023 0844   BILIRUBINUR NEGATIVE 09/26/2023 0844   BILIRUBINUR Negative 09/29/2018 1020   KETONESUR NEGATIVE 09/26/2023 0844   PROTEINUR NEGATIVE 09/26/2023 0844   NITRITE NEGATIVE 09/26/2023 0844   LEUKOCYTESUR NEGATIVE 09/26/2023 0844   Sepsis Labs Recent Labs  Lab 10/02/23 0846 10/03/23 0417 10/04/23 0454 10/05/23 0441  WBC 5.5 6.1 5.9 5.5   Microbiology Recent Results (from the past 240 hours)  Resp panel by RT-PCR (RSV, Flu A&B, Covid) Anterior Nasal Swab     Status: None   Collection Time: 09/26/23  8:30 AM   Specimen: Anterior Nasal Swab  Result Value Ref Range Status   SARS Coronavirus 2 by RT PCR NEGATIVE NEGATIVE Final    Comment: (NOTE) SARS-CoV-2 target nucleic acids are NOT DETECTED.  The SARS-CoV-2 RNA is generally detectable in upper respiratory specimens during the acute phase of infection. The lowest concentration of SARS-CoV-2 viral copies this assay can detect is 138 copies/mL. A negative result does not preclude SARS-Cov-2 infection and should not be used as the sole basis for treatment or other patient management decisions. A negative result may occur with  improper specimen collection/handling, submission of specimen other than nasopharyngeal swab, presence of viral mutation(s) within the areas targeted by this assay, and inadequate number of viral copies(<138 copies/mL). A negative result must be combined  with clinical observations, patient history, and epidemiological information. The expected result is Negative.  Fact Sheet for Patients:  BloggerCourse.com  Fact Sheet for Healthcare Providers:  SeriousBroker.it  This test is no t yet approved or cleared by the [place]  FDA and  has been authorized for detection and/or diagnosis of SARS-CoV-2 by FDA under an Emergency Use Authorization (EUA). This EUA will remain  in effect (meaning this test can be used) for the duration of the COVID-19 declaration under Section 564(b)(1) of the Act, 21 U.S.C.section 360bbb-3(b)(1), unless the authorization is terminated  or revoked sooner.  Influenza A by PCR NEGATIVE NEGATIVE Final   Influenza B by PCR NEGATIVE NEGATIVE Final    Comment: (NOTE) The Xpert Xpress SARS-CoV-2/FLU/RSV plus assay is intended as an aid in the diagnosis of influenza from Nasopharyngeal swab specimens and should not be used as a sole basis for treatment. Nasal washings and aspirates are unacceptable for Xpert Xpress SARS-CoV-2/FLU/RSV testing.  Fact Sheet for Patients: BloggerCourse.com  Fact Sheet for Healthcare Providers: SeriousBroker.it  This test is not yet approved or cleared by the [place]  FDA and has been authorized for detection and/or diagnosis of SARS-CoV-2 by FDA under an Emergency Use Authorization (EUA). This EUA will remain in effect (meaning this test can be used) for the duration of the COVID-19 declaration under Section 564(b)(1) of the Act, 21 U.S.C. section 360bbb-3(b)(1), unless the authorization is terminated or revoked.     Resp Syncytial Virus by PCR NEGATIVE NEGATIVE Final    Comment: (NOTE) Fact Sheet for Patients: BloggerCourse.com  Fact Sheet for Healthcare Providers: SeriousBroker.it  This test is not yet approved  or cleared by the [place]  FDA and has been authorized for detection and/or diagnosis of SARS-CoV-2 by FDA under an Emergency Use Authorization (EUA). This EUA will remain in effect (meaning this test can be used) for the duration of the COVID-19 declaration under Section 564(b)(1) of the Act, 21 U.S.C. section 360bbb-3(b)(1), unless the authorization is terminated or revoked.  Performed at [facility], [address], [place], [place] [address]   Respiratory (~20 pathogens) panel by PCR     Status: None   Collection Time: 09/26/23  8:30 AM   Specimen: Nasopharyngeal Swab; Respiratory  Result Value Ref Range Status   Adenovirus NOT DETECTED NOT DETECTED Final   Coronavirus 229E NOT DETECTED NOT DETECTED Final    Comment: (NOTE) The Coronavirus on the Respiratory Panel, DOES NOT test for the novel  Coronavirus (2019 nCoV)    Coronavirus HKU1 NOT DETECTED NOT DETECTED Final   Coronavirus NL63 NOT DETECTED NOT DETECTED Final   Coronavirus OC43 NOT DETECTED NOT DETECTED Final   Metapneumovirus NOT DETECTED NOT DETECTED Final   Rhinovirus / Enterovirus NOT DETECTED NOT DETECTED Final   Influenza A NOT DETECTED NOT DETECTED Final   Influenza B NOT DETECTED NOT DETECTED Final   Parainfluenza Virus 1 NOT DETECTED NOT DETECTED Final   Parainfluenza Virus 2 NOT DETECTED NOT DETECTED Final   Parainfluenza Virus 3 NOT DETECTED NOT DETECTED Final   Parainfluenza Virus 4 NOT DETECTED NOT DETECTED Final   Respiratory Syncytial Virus NOT DETECTED NOT DETECTED Final   Bordetella pertussis NOT DETECTED NOT DETECTED Final   Bordetella Parapertussis NOT DETECTED NOT DETECTED Final   Chlamydophila pneumoniae NOT DETECTED NOT DETECTED Final   Mycoplasma pneumoniae NOT DETECTED NOT DETECTED Final    Comment: Performed at [facility], [address], [place], [place] [address]  MRSA Next Gen by PCR, Nasal     Status: None   Collection Time: 09/27/23 11:59 AM   Specimen: Nasal Mucosa; Nasal  Swab  Result Value Ref Range Status   MRSA by PCR Next Gen NOT DETECTED NOT DETECTED Final    Comment: (NOTE) The GeneXpert MRSA Assay (FDA approved for NASAL specimens only), is one component of a comprehensive MRSA colonization surveillance program. It is not intended to diagnose MRSA infection nor to guide or monitor treatment for MRSA infections. Test performance is not FDA approved in patients less than 87 years old. Performed at [facility], [phone]  [address] [place], [place] [address]   Culture, blood (Routine X 2) w Reflex to ID Panel     Status: Abnormal   Collection Time: 09/29/23 12:35 PM   Specimen: BLOOD LEFT HAND  Result Value Ref Range Status   Specimen Description   Final    BLOOD LEFT HAND BOTTLES DRAWN AEROBIC AND ANAEROBIC Performed at [facility], [address], [place], [place] [address]    Special Requests   Final    Blood Culture adequate volume Performed at [facility], [address], [place], [place] [address]    Culture  Setup Time   Final    IN BOTH AEROBIC AND ANAEROBIC BOTTLES GRAM POSITIVE COCCI Gram Stain Report Called to,Read Back By and Verified With: A RAY RN9/22/25 @1006  BY J. WHITE  GRAM STAIN REVIEWED-AGREE WITH RESULT DRT    Culture STAPHYLOCOCCUS AUREUS (A)  Final   Report Status 10/02/2023 FINAL  Final   Organism ID, Bacteria STAPHYLOCOCCUS AUREUS  Final      Susceptibility   Staphylococcus aureus - MIC*    CIPROFLOXACIN  <=0.5 SENSITIVE Sensitive     ERYTHROMYCIN >=8 RESISTANT Resistant     GENTAMICIN <=0.5 SENSITIVE Sensitive     OXACILLIN <=0.25 SENSITIVE Sensitive     TETRACYCLINE <=1 SENSITIVE Sensitive     VANCOMYCIN  <=0.5 SENSITIVE Sensitive     TRIMETH/SULFA <=10 SENSITIVE Sensitive     CLINDAMYCIN RESISTANT Resistant     RIFAMPIN <=0.5 SENSITIVE Sensitive     Inducible Clindamycin POSITIVE Resistant     LINEZOLID 2 SENSITIVE Sensitive     * STAPHYLOCOCCUS AUREUS  Blood Culture ID Panel (Reflexed)     Status: Abnormal    Collection Time: 09/29/23 12:35 PM  Result Value Ref Range Status   Enterococcus faecalis NOT DETECTED NOT DETECTED Final   Enterococcus Faecium NOT DETECTED NOT DETECTED Final   Listeria monocytogenes NOT DETECTED NOT DETECTED Final   Staphylococcus species DETECTED (A) NOT DETECTED Final    Comment: CRITICAL RESULT CALLED TO, READ BACK BY AND VERIFIED WITH: PHARMD LORI POOLE ON 09/30/23 @ 1430 BY DRT    Staphylococcus aureus (BCID) DETECTED (A) NOT DETECTED Final    Comment: CRITICAL RESULT CALLED TO, READ BACK BY AND VERIFIED WITH: PHARMD LORI POOLE ON 09/30/23 @ 1430 BY DRT    Staphylococcus epidermidis NOT DETECTED NOT DETECTED Final   Staphylococcus lugdunensis NOT DETECTED NOT DETECTED Final   Streptococcus species NOT DETECTED NOT DETECTED Final   Streptococcus agalactiae NOT DETECTED NOT DETECTED Final   Streptococcus pneumoniae NOT DETECTED NOT DETECTED Final   Streptococcus pyogenes NOT DETECTED NOT DETECTED Final   A.calcoaceticus-baumannii NOT DETECTED NOT DETECTED Final   Bacteroides fragilis NOT DETECTED NOT DETECTED Final   Enterobacterales NOT DETECTED NOT DETECTED Final   Enterobacter cloacae complex NOT DETECTED NOT DETECTED Final   Escherichia coli NOT DETECTED NOT DETECTED Final   Klebsiella aerogenes NOT DETECTED NOT DETECTED Final   Klebsiella oxytoca NOT DETECTED NOT DETECTED Final   Klebsiella pneumoniae NOT DETECTED NOT DETECTED Final   Proteus species NOT DETECTED NOT DETECTED Final   Salmonella species NOT DETECTED NOT DETECTED Final   Serratia marcescens NOT DETECTED NOT DETECTED Final   Haemophilus influenzae NOT DETECTED NOT DETECTED Final   Neisseria meningitidis NOT DETECTED NOT DETECTED Final   Pseudomonas aeruginosa NOT DETECTED NOT DETECTED Final   Stenotrophomonas maltophilia NOT DETECTED NOT DETECTED Final   Candida albicans NOT DETECTED NOT DETECTED Final   Candida auris NOT DETECTED NOT DETECTED Final  Candida glabrata NOT DETECTED NOT  DETECTED Final   Candida krusei NOT DETECTED NOT DETECTED Final   Candida parapsilosis NOT DETECTED NOT DETECTED Final   Candida tropicalis NOT DETECTED NOT DETECTED Final   Cryptococcus neoformans/gattii NOT DETECTED NOT DETECTED Final   Meth resistant mecA/C and MREJ NOT DETECTED NOT DETECTED Final    Comment: Performed at [facility], [address] [place], [place] [address]  Culture, blood (Routine X 2) w Reflex to ID Panel     Status: Abnormal   Collection Time: 09/29/23 12:50 PM   Specimen: BLOOD RIGHT HAND  Result Value Ref Range Status   Specimen Description   Final    BLOOD RIGHT HAND BOTTLES DRAWN AEROBIC AND ANAEROBIC Performed at [facility], [address] [place], [place] [address]    Special Requests   Final    Blood Culture adequate volume Performed at [facility], [address] [place], [place] [address]    Culture  Setup Time   Final    AEROBIC BOTTLE ONLY GRAM POSITIVE COCCI Gram Stain Report Called to,Read Back By and Verified With: A RAY RN 09/30/23 @1010  BY J. WHITE GRAM STAIN REVIEWED-AGREE WITH RESULT DRT    Culture (A)  Final    STAPHYLOCOCCUS AUREUS SUSCEPTIBILITIES PERFORMED ON PREVIOUS CULTURE WITHIN THE LAST 5 DAYS. Performed at [facility], [address] [place], [place] [address]    Report Status 10/02/2023 FINAL  Final  Culture, blood (Routine X 2) w Reflex to ID Panel     Status: None (Preliminary result)   Collection Time: 10/01/23  4:02 AM   Specimen: BLOOD LEFT ARM  Result Value Ref Range Status   Specimen Description BLOOD LEFT ARM  Final   Special Requests   Final    BOTTLES DRAWN AEROBIC AND ANAEROBIC Blood Culture adequate volume   Culture   Final    NO GROWTH 4 DAYS Performed at [facility], [address], [place], [place] [address]    Report Status PENDING  Incomplete  Culture, blood (Routine X 2) w Reflex to ID Panel     Status: None (Preliminary result)   Collection Time: 10/01/23  4:05 AM   Specimen: BLOOD  LEFT HAND  Result Value Ref Range Status   Specimen Description BLOOD LEFT HAND  Final   Special Requests   Final    BOTTLES DRAWN AEROBIC AND ANAEROBIC Blood Culture adequate volume   Culture   Final    NO GROWTH 4 DAYS Performed at [facility] [address] [place], [place] [address]    Report Status PENDING  Incomplete     Time coordinating discharge: 35 minutes  SIGNED:   Adron JONETTA Fairly, DO [facility] 10/05/2023, 9:39 AM  If 7PM-7AM, please contact night-coverage [email or web address]

## 2023-10-05 NOTE — Progress Notes (Signed)
 Report called to [place] LPN at [facility]. Going to room B14 bed 1, phone number [phone]. Guardian informed per social.

## 2023-10-05 NOTE — TOC Transition Note (Signed)
 Transition of Care [facility]) - Discharge Note   Patient Details  Name: [name] MRN: [id number] Date of Birth: 1965/01/30  Transition of Care [facility] CM/SW Contact:  Lucie Lunger, LCSWA Phone Number: 10/05/2023, 11:28 AM  Clinical Narrative:    CSW updated that pt is medically stable for D/C to [facility]. Debbie with [facility] updated and they are ready for pt to return. Pts LG aware of plan for D/C to [facility] today. RN provided with room and report numbers. EMS called for transport. TOC signing off.   Final next level of care: Skilled Nursing Facility Barriers to Discharge: Barriers Resolved   Patient Goals and CMS Choice Patient states their goals for this hospitalization and ongoing recovery are:: return to [facility] CMS Medicare.gov Compare Post Acute Care list provided to:: Legal Guardian Choice offered to / list presented to : [facility] POA / Guardian [place] ownership interest in [facility].provided to:: [facility] POA / Guardian    Discharge Placement                Patient to be transferred to facility by: EMS Name of family member notified: Guardian aware Patient and family notified of of transfer: 10/05/23  Discharge Plan and Services Additional resources added to the After Visit Summary for                                       Social Drivers of Health (SDOH) Interventions SDOH Screenings   Food Insecurity: Patient Unable To Answer (09/25/2023)  Housing: Low Risk  (09/30/2023)  Transportation Needs: Patient Unable To Answer (09/25/2023)  Utilities: Patient Unable To Answer (09/25/2023)  Recent Concern: Utilities - At Risk (08/18/2023)   Received from [facility]  Depression (PHQ2-9): Low Risk  (03/07/2020)  Financial Resource Strain: Low Risk  (07/22/2023)   Received from [facility]  Physical Activity: Inactive (07/22/2023)   Received from [facility]  Social Connections: Moderately Isolated (07/22/2023)   Received from [facility]  Stress: No  Stress Concern Present (08/18/2023)   Received from [facility]  Tobacco Use: High Risk (09/25/2023)  Health Literacy: Low Risk  (07/22/2023)   Received from [facility]     Readmission Risk Interventions    10/03/2023    9:50 AM  Readmission Risk Prevention Plan  Transportation Screening Complete  HRI or Home Care Consult Complete  Social Work Consult for Recovery Care Planning/Counseling Complete  Palliative Care Screening Not Applicable  Medication Review Oceanographer) Complete

## 2023-10-05 NOTE — Plan of Care (Signed)

## 2023-10-05 NOTE — Progress Notes (Signed)
 Patient belongings found in room after discharge wallet found with license and 5 credit cards cell phone. Inventory counted with Luke Millie PEAK. Alli Lawrencia Mauney,RN, and Mliss Thompson,LPN. Belongings given to [facility] security to lock in the hospital safe.

## 2023-10-05 NOTE — Plan of Care (Signed)
  Problem: Education: Goal: Knowledge of General Education information will improve Description: Including pain rating scale, medication(s)/side effects and non-pharmacologic comfort measures 10/05/2023 0707 by Catharine Kand, RN Outcome: Progressing 10/05/2023 0706 by Oriya Kettering, RN Outcome: Progressing   Problem: Health Behavior/Discharge Planning: Goal: Ability to manage health-related needs will improve 10/05/2023 0707 by Caidance Sybert, RN Outcome: Progressing 10/05/2023 0706 by Chaselyn Nanney, RN Outcome: Progressing   Problem: Clinical Measurements: Goal: Ability to maintain clinical measurements within normal limits will improve 10/05/2023 0707 by Catharine Kand, RN Outcome: Progressing 10/05/2023 0706 by Laruen Risser, RN Outcome: Progressing Goal: Will remain free from infection 10/05/2023 0707 by Cantrell Larouche, RN Outcome: Progressing 10/05/2023 0706 by Antoniette Peake, RN Outcome: Progressing Goal: Diagnostic test results will improve 10/05/2023 0707 by Versia Mignogna, RN Outcome: Progressing 10/05/2023 0706 by Kennisha Qin, RN Outcome: Progressing Goal: Respiratory complications will improve 10/05/2023 0707 by Gaius Ishaq, RN Outcome: Progressing 10/05/2023 0706 by Haziel Molner, RN Outcome: Progressing Goal: Cardiovascular complication will be avoided 10/05/2023 0707 by Zyair Russi, RN Outcome: Progressing 10/05/2023 0706 by Nikoleta Dady, RN Outcome: Progressing   Problem: Activity: Goal: Risk for activity intolerance will decrease 10/05/2023 0707 by Catharine Kand, RN Outcome: Progressing 10/05/2023 0706 by Catharine Kand, RN Outcome: Progressing   Problem: Nutrition: Goal: Adequate nutrition will be maintained 10/05/2023 0707 by Catharine Kand, RN Outcome: Progressing 10/05/2023 0706 by Catharine Kand, RN Outcome: Progressing   Problem: Coping: Goal: Level of anxiety will decrease 10/05/2023 0707 by Jamez Ambrocio, RN Outcome: Progressing 10/05/2023 0706 by Catharine Kand, RN Outcome: Progressing   Problem:  Elimination: Goal: Will not experience complications related to bowel motility 10/05/2023 0707 by Catharine Kand, RN Outcome: Progressing 10/05/2023 0706 by Anginette Espejo, RN Outcome: Progressing Goal: Will not experience complications related to urinary retention 10/05/2023 0707 by Catharine Kand, RN Outcome: Progressing 10/05/2023 0706 by Kenlyn Lose, RN Outcome: Progressing   Problem: Pain Managment: Goal: General experience of comfort will improve and/or be controlled 10/05/2023 0707 by Shawnna Pancake, RN Outcome: Progressing 10/05/2023 0706 by Catharine Kand, RN Outcome: Progressing   Problem: Safety: Goal: Ability to remain free from injury will improve 10/05/2023 0707 by Catharine Kand, RN Outcome: Progressing 10/05/2023 0706 by Catharine Kand, RN Outcome: Progressing   Problem: Skin Integrity: Goal: Risk for impaired skin integrity will decrease 10/05/2023 0707 by Catharine Kand, RN Outcome: Progressing 10/05/2023 0706 by Victoriah Wilds, RN Outcome: Progressing

## 2023-10-05 NOTE — NC FL2 (Signed)
 [place]  MEDICAID FL2 LEVEL OF CARE FORM     IDENTIFICATION  Patient Name: [name] Birthdate: 1965-06-10 Sex: male Admission Date (Current Location): 09/25/2023  [facility] and [place] Number:  [employer] and Address:  [facility],  [address], [place] [address]      Provider Number: [id number]  Attending Physician Name and Address:  Maree Adron JONETTA, DO  Relative Name and Phone Number:  Adolm Ehrich (Niece)  [phone]    Current Level of Care: Hospital Recommended Level of Care: Skilled Nursing Facility Prior Approval Number:    Date Approved/Denied:   PASRR Number:    Discharge Plan: SNF    Current Diagnoses: Patient Active Problem List   Diagnosis Date Noted   Staphylococcus aureus bacteremia 10/01/2023   Gram-positive bacteremia 09/30/2023   Paroxysmal atrial fibrillation with RVR (HCC) 09/29/2023   Hepatic encephalopathy (HCC) 09/29/2023   Acute metabolic encephalopathy 09/25/2023   Hyponatremia 04/04/2021   Esophageal varices (HCC) 04/28/2020   History of colonic polyps 04/28/2020   Alcohol abuse 04/28/2020   Acute shoulder pain due to trauma, right 10/12/2019   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 10/12/2019   Localized osteoarthritis of wrist 10/12/2019   Nontraumatic complete tear of right rotator cuff 10/12/2019   Cocaine abuse (HCC) 09/09/2019   1st degree AV block 08/17/2019   Lyme disease 08/17/2019   Fever of unknown origin 08/16/2019   Elevated AST (SGOT) 08/04/2019   Dyslipidemia 07/14/2019   Iron  deficiency anemia 07/08/2019   Migraine without aura 05/18/2019   Acute on chronic diastolic CHF (congestive heart failure) (HCC) 05/14/2019   Scrotal edema 04/22/2019   Bilateral hydrocele 04/22/2019   Pancytopenia (HCC)    Long-term current use of opiate analgesic 03/21/2019   Dizziness 03/21/2019   Meralgia paresthetica of left side 03/21/2019   Polyarthropathy 03/21/2019   Arm mass, right  07/31/2017   Bilateral primary osteoarthritis of knee 04/04/2017   Depression, recurrent 07/18/2016   Encounter for screening for other viral diseases 06/20/2016   Mixed hyperlipidemia 10/31/2015   Leukopenia 07/08/2015   Abdominal wall bulge 03/02/2015   Portal hypertension (HCC) 08/24/2014   Hypokalemia 08/24/2014   COPD, severity to be determined (HCC) 08/24/2014   Biliary colic 07/14/2014   CHF (congestive heart failure) (HCC) 07/14/2014   H/O ETOH abuse 07/14/2014   Thrombocytopenia 07/30/2013   Chronic pain disorder 07/30/2013   Edema 07/30/2013   Genu varus    Hypogonadism male 05/05/2012   Umbilical hernia without obstruction and without gangrene 12/26/2011   Asthma 08/28/2011   Cirrhosis (HCC) 08/14/2011   UNSPECIFIED TACHYCARDIA 02/01/2010   Gout 05/22/2008   Hypertension, essential 05/22/2008   Atrial fibrillation (HCC) 05/22/2008   DIASTOLIC HEART FAILURE, CHRONIC 05/22/2008   Acute on chronic diastolic heart failure (HCC) 05/22/2008   Morbid (severe) obesity due to excess calories (HCC) 02/21/2007   Coronary atherosclerosis 02/21/2007   Obstructive sleep apnea of adult 02/04/2007    Orientation RESPIRATION BLADDER Height & Weight     Self  O2 (Currently on 2L - See DC summary) Continent Weight: (!) 352 lb 8.3 oz (159.9 kg) Height:  6' (182.9 cm)  BEHAVIORAL SYMPTOMS/MOOD NEUROLOGICAL BOWEL NUTRITION STATUS      Continent Diet (See DC summary)  AMBULATORY STATUS COMMUNICATION OF NEEDS Skin   Extensive Assist Verbally Bruising, Other (Comment) (Redness)  Personal Care Assistance Level of Assistance  Bathing, Feeding, Dressing Bathing Assistance: Maximum assistance Feeding assistance: Limited assistance Dressing Assistance: Maximum assistance     Functional Limitations Info  Sight, Hearing, Speech Sight Info: Adequate Hearing Info: Adequate Speech Info: Adequate    SPECIAL CARE FACTORS FREQUENCY  PT (By licensed PT)     PT  Frequency: 5 times a week              Contractures Contractures Info: Not present    Additional Factors Info  Code Status, Allergies Code Status Info: FULL Allergies Info: Allopurinol , Fluoxetine           Current Medications (10/05/2023):  This is the current hospital active medication list Current Facility-Administered Medications  Medication Dose Route Frequency Provider Last Rate Last Admin   acetaminophen  (TYLENOL ) tablet 650 mg  650 mg Oral Q6H PRN Tat, Alm, MD   650 mg at 09/29/23 1420   Or   acetaminophen  (TYLENOL ) suppository 650 mg  650 mg Rectal Q6H PRN Tat, Alm, MD       ceFAZolin  (ANCEF ) IVPB 2g/100 mL premix  2 g Intravenous Q8H Tat, David, MD 200 mL/hr at 10/05/23 0453 2 g at 10/05/23 0453   Chlorhexidine  Gluconate Cloth 2 % PADS 6 each  6 each Topical V9399 Evonnie Alm, MD   6 each at 10/05/23 0458   diltiazem  (CARDIZEM  CD) 24 hr capsule 240 mg  240 mg Oral Daily Tat, Alm, MD   240 mg at 10/05/23 9165   hydrocortisone  (CORTEF ) tablet 20 mg  20 mg Oral BID Evonnie Alm, MD   20 mg at 10/05/23 9165   lactulose  (CHRONULAC ) 10 GM/15ML solution 30 g  30 g Oral TID Evonnie Alm, MD   30 g at 10/05/23 9165   levalbuterol  (XOPENEX ) nebulizer solution 0.63 mg  0.63 mg Nebulization BID Tat, Alm, MD   0.63 mg at 10/05/23 0809   levothyroxine  (SYNTHROID ) tablet 125 mcg  125 mcg Oral Q0600 Evonnie Alm, MD   125 mcg at 10/05/23 0458   LORazepam  (ATIVAN ) injection 0.5 mg  0.5 mg Intravenous Q6H PRN Evonnie Alm, MD   0.5 mg at 10/04/23 1538   magic mouthwash  10 mL Oral TID Shah, Pratik D, DO   10 mL at 10/05/23 9165   metoprolol  tartrate (LOPRESSOR ) tablet 50 mg  50 mg Oral BID Evonnie Alm, MD   50 mg at 10/05/23 9165   nystatin  (MYCOSTATIN /NYSTOP ) topical powder   Topical BID Shah, Pratik D, DO   Given at 10/05/23 9161   ondansetron  (ZOFRAN ) tablet 4 mg  4 mg Oral Q6H PRN Tat, Alm, MD       Or   ondansetron  (ZOFRAN ) injection 4 mg  4 mg Intravenous Q6H PRN Tat, Alm, MD        polyethylene glycol (MIRALAX  / GLYCOLAX ) packet 17 g  17 g Oral Daily PRN Tat, Alm, MD       sodium chloride  flush (NS) 0.9 % injection 10-40 mL  10-40 mL Intracatheter Q12H Shah, Pratik D, DO   10 mL at 10/05/23 9161   sodium chloride  flush (NS) 0.9 % injection 10-40 mL  10-40 mL Intracatheter PRN Maree, Pratik D, DO       thiamine  (VITAMIN B1) tablet 100 mg  100 mg Oral Daily Tat, David, MD   100 mg at 10/05/23 9165     Discharge Medications: Please see discharge summary for a list of discharge medications.  Relevant Imaging Results:  Relevant Lab Results:  Additional Information SS# [id number]  Lucie Lunger, LCSWA

## 2023-10-06 LAB — CULTURE, BLOOD (ROUTINE X 2)
Culture: NO GROWTH
Culture: NO GROWTH
Special Requests: ADEQUATE
Special Requests: ADEQUATE

## 2023-10-07 DIAGNOSIS — G9341 Metabolic encephalopathy: Secondary | ICD-10-CM | POA: Diagnosis not present

## 2023-10-07 DIAGNOSIS — A4101 Sepsis due to Methicillin susceptible Staphylococcus aureus: Secondary | ICD-10-CM | POA: Diagnosis not present

## 2023-10-08 DIAGNOSIS — M25571 Pain in right ankle and joints of right foot: Secondary | ICD-10-CM | POA: Diagnosis not present

## 2023-10-08 DIAGNOSIS — E871 Hypo-osmolality and hyponatremia: Secondary | ICD-10-CM | POA: Diagnosis not present

## 2023-10-08 DIAGNOSIS — E876 Hypokalemia: Secondary | ICD-10-CM | POA: Diagnosis not present

## 2023-10-08 DIAGNOSIS — G9341 Metabolic encephalopathy: Secondary | ICD-10-CM | POA: Diagnosis not present

## 2023-10-08 DIAGNOSIS — A4101 Sepsis due to Methicillin susceptible Staphylococcus aureus: Secondary | ICD-10-CM | POA: Diagnosis not present

## 2023-10-09 DIAGNOSIS — G9341 Metabolic encephalopathy: Secondary | ICD-10-CM | POA: Diagnosis not present

## 2023-10-09 DIAGNOSIS — E039 Hypothyroidism, unspecified: Secondary | ICD-10-CM | POA: Diagnosis not present

## 2023-10-09 DIAGNOSIS — J449 Chronic obstructive pulmonary disease, unspecified: Secondary | ICD-10-CM | POA: Diagnosis not present

## 2023-10-10 ENCOUNTER — Inpatient Hospital Stay (HOSPITAL_COMMUNITY)
Admission: EM | Admit: 2023-10-10 | Discharge: 2023-10-16 | DRG: 682 | Disposition: A | Discharge status: Skilled Nursing Facility | Source: Skilled Nursing Facility | Attending: Family Medicine | Admitting: Family Medicine

## 2023-10-10 ENCOUNTER — Encounter (HOSPITAL_COMMUNITY): Payer: Self-pay | Admitting: *Deleted

## 2023-10-10 ENCOUNTER — Other Ambulatory Visit: Payer: Self-pay

## 2023-10-10 ENCOUNTER — Emergency Department (HOSPITAL_COMMUNITY)

## 2023-10-10 DIAGNOSIS — R4189 Other symptoms and signs involving cognitive functions and awareness: Secondary | ICD-10-CM | POA: Diagnosis present

## 2023-10-10 DIAGNOSIS — M069 Rheumatoid arthritis, unspecified: Secondary | ICD-10-CM | POA: Diagnosis not present

## 2023-10-10 DIAGNOSIS — Z79890 Hormone replacement therapy (postmenopausal): Secondary | ICD-10-CM

## 2023-10-10 DIAGNOSIS — N179 Acute kidney failure, unspecified: Principal | ICD-10-CM | POA: Diagnosis present

## 2023-10-10 DIAGNOSIS — L89326 Pressure-induced deep tissue damage of left buttock: Secondary | ICD-10-CM | POA: Diagnosis not present

## 2023-10-10 DIAGNOSIS — B9561 Methicillin susceptible Staphylococcus aureus infection as the cause of diseases classified elsewhere: Secondary | ICD-10-CM | POA: Diagnosis present

## 2023-10-10 DIAGNOSIS — E039 Hypothyroidism, unspecified: Secondary | ICD-10-CM | POA: Insufficient documentation

## 2023-10-10 DIAGNOSIS — K766 Portal hypertension: Secondary | ICD-10-CM | POA: Diagnosis not present

## 2023-10-10 DIAGNOSIS — E860 Dehydration: Secondary | ICD-10-CM | POA: Diagnosis not present

## 2023-10-10 DIAGNOSIS — G9341 Metabolic encephalopathy: Secondary | ICD-10-CM | POA: Diagnosis not present

## 2023-10-10 DIAGNOSIS — R197 Diarrhea, unspecified: Secondary | ICD-10-CM | POA: Diagnosis present

## 2023-10-10 DIAGNOSIS — Z800 Family history of malignant neoplasm of digestive organs: Secondary | ICD-10-CM

## 2023-10-10 DIAGNOSIS — I482 Chronic atrial fibrillation, unspecified: Secondary | ICD-10-CM | POA: Diagnosis not present

## 2023-10-10 DIAGNOSIS — K7682 Hepatic encephalopathy: Secondary | ICD-10-CM | POA: Diagnosis not present

## 2023-10-10 DIAGNOSIS — I10 Essential (primary) hypertension: Secondary | ICD-10-CM | POA: Diagnosis not present

## 2023-10-10 DIAGNOSIS — G4730 Sleep apnea, unspecified: Secondary | ICD-10-CM | POA: Diagnosis present

## 2023-10-10 DIAGNOSIS — Z79899 Other long term (current) drug therapy: Secondary | ICD-10-CM

## 2023-10-10 DIAGNOSIS — E875 Hyperkalemia: Secondary | ICD-10-CM | POA: Diagnosis present

## 2023-10-10 DIAGNOSIS — E876 Hypokalemia: Secondary | ICD-10-CM | POA: Diagnosis present

## 2023-10-10 DIAGNOSIS — I5032 Chronic diastolic (congestive) heart failure: Secondary | ICD-10-CM | POA: Diagnosis not present

## 2023-10-10 DIAGNOSIS — Z888 Allergy status to other drugs, medicaments and biological substances status: Secondary | ICD-10-CM

## 2023-10-10 DIAGNOSIS — I110 Hypertensive heart disease with heart failure: Secondary | ICD-10-CM | POA: Diagnosis present

## 2023-10-10 DIAGNOSIS — E871 Hypo-osmolality and hyponatremia: Secondary | ICD-10-CM | POA: Diagnosis not present

## 2023-10-10 DIAGNOSIS — I330 Acute and subacute infective endocarditis: Secondary | ICD-10-CM | POA: Diagnosis present

## 2023-10-10 DIAGNOSIS — K7460 Unspecified cirrhosis of liver: Secondary | ICD-10-CM | POA: Diagnosis present

## 2023-10-10 DIAGNOSIS — Z6841 Body Mass Index (BMI) 40.0 and over, adult: Secondary | ICD-10-CM

## 2023-10-10 DIAGNOSIS — I339 Acute and subacute endocarditis, unspecified: Secondary | ICD-10-CM | POA: Insufficient documentation

## 2023-10-10 DIAGNOSIS — R7881 Bacteremia: Secondary | ICD-10-CM | POA: Diagnosis present

## 2023-10-10 DIAGNOSIS — Z452 Encounter for adjustment and management of vascular access device: Secondary | ICD-10-CM | POA: Diagnosis not present

## 2023-10-10 DIAGNOSIS — E8809 Other disorders of plasma-protein metabolism, not elsewhere classified: Secondary | ICD-10-CM | POA: Diagnosis present

## 2023-10-10 DIAGNOSIS — I8510 Secondary esophageal varices without bleeding: Secondary | ICD-10-CM | POA: Diagnosis not present

## 2023-10-10 DIAGNOSIS — L409 Psoriasis, unspecified: Secondary | ICD-10-CM | POA: Diagnosis present

## 2023-10-10 DIAGNOSIS — R9431 Abnormal electrocardiogram [ECG] [EKG]: Secondary | ICD-10-CM | POA: Insufficient documentation

## 2023-10-10 DIAGNOSIS — K7030 Alcoholic cirrhosis of liver without ascites: Secondary | ICD-10-CM | POA: Diagnosis present

## 2023-10-10 DIAGNOSIS — D696 Thrombocytopenia, unspecified: Secondary | ICD-10-CM | POA: Diagnosis present

## 2023-10-10 DIAGNOSIS — I480 Paroxysmal atrial fibrillation: Secondary | ICD-10-CM | POA: Diagnosis not present

## 2023-10-10 DIAGNOSIS — K219 Gastro-esophageal reflux disease without esophagitis: Secondary | ICD-10-CM | POA: Diagnosis present

## 2023-10-10 DIAGNOSIS — D6959 Other secondary thrombocytopenia: Secondary | ICD-10-CM | POA: Diagnosis present

## 2023-10-10 DIAGNOSIS — L89610 Pressure ulcer of right heel, unstageable: Secondary | ICD-10-CM | POA: Diagnosis not present

## 2023-10-10 DIAGNOSIS — E7220 Disorder of urea cycle metabolism, unspecified: Secondary | ICD-10-CM | POA: Insufficient documentation

## 2023-10-10 DIAGNOSIS — Z7982 Long term (current) use of aspirin: Secondary | ICD-10-CM

## 2023-10-10 DIAGNOSIS — R531 Weakness: Secondary | ICD-10-CM | POA: Diagnosis not present

## 2023-10-10 DIAGNOSIS — Z9049 Acquired absence of other specified parts of digestive tract: Secondary | ICD-10-CM

## 2023-10-10 DIAGNOSIS — G629 Polyneuropathy, unspecified: Secondary | ICD-10-CM | POA: Diagnosis present

## 2023-10-10 DIAGNOSIS — L89316 Pressure-induced deep tissue damage of right buttock: Secondary | ICD-10-CM | POA: Diagnosis present

## 2023-10-10 DIAGNOSIS — F17220 Nicotine dependence, chewing tobacco, uncomplicated: Secondary | ICD-10-CM | POA: Diagnosis present

## 2023-10-10 LAB — CBC WITH DIFFERENTIAL/PLATELET
Abs Immature Granulocytes: 0.05 K/uL (ref 0.00–0.07)
Basophils Absolute: 0 K/uL (ref 0.0–0.1)
Basophils Relative: 0 %
Eosinophils Absolute: 0 K/uL (ref 0.0–0.5)
Eosinophils Relative: 0 %
HCT: 41.4 % (ref 39.0–52.0)
Hemoglobin: 14.1 g/dL (ref 13.0–17.0)
Immature Granulocytes: 1 %
Lymphocytes Relative: 11 %
Lymphs Abs: 0.9 K/uL (ref 0.7–4.0)
MCH: 30.9 pg (ref 26.0–34.0)
MCHC: 34.1 g/dL (ref 30.0–36.0)
MCV: 90.6 fL (ref 80.0–100.0)
Monocytes Absolute: 0.4 K/uL (ref 0.1–1.0)
Monocytes Relative: 5 %
Neutro Abs: 6.4 K/uL (ref 1.7–7.7)
Neutrophils Relative %: 83 %
Platelets: 56 K/uL — ABNORMAL LOW (ref 150–400)
RBC: 4.57 MIL/uL (ref 4.22–5.81)
RDW: 18.6 % — ABNORMAL HIGH (ref 11.5–15.5)
WBC: 7.8 K/uL (ref 4.0–10.5)
nRBC: 0 % (ref 0.0–0.2)

## 2023-10-10 LAB — AMMONIA: Ammonia: 47 umol/L — ABNORMAL HIGH (ref 9–35)

## 2023-10-10 LAB — BASIC METABOLIC PANEL WITH GFR
Anion gap: 15 (ref 5–15)
Anion gap: 13 (ref 5–15)
BUN: 28 mg/dL — ABNORMAL HIGH (ref 6–20)
BUN: 29 mg/dL — ABNORMAL HIGH (ref 6–20)
CO2: 19 mmol/L — ABNORMAL LOW (ref 22–32)
CO2: 19 mmol/L — ABNORMAL LOW (ref 22–32)
Calcium: 9.1 mg/dL (ref 8.9–10.3)
Calcium: 8.7 mg/dL — ABNORMAL LOW (ref 8.9–10.3)
Chloride: 98 mmol/L (ref 98–111)
Chloride: 100 mmol/L (ref 98–111)
Creatinine, Ser: 1.95 mg/dL — ABNORMAL HIGH (ref 0.61–1.24)
Creatinine, Ser: 2.02 mg/dL — ABNORMAL HIGH (ref 0.61–1.24)
GFR, Estimated: 39 mL/min — ABNORMAL LOW (ref >60)
GFR, Estimated: 37 mL/min — ABNORMAL LOW (ref >60)
Glucose, Bld: 146 mg/dL — ABNORMAL HIGH (ref 70–99)
Glucose, Bld: 144 mg/dL — ABNORMAL HIGH (ref 70–99)
Potassium: 3.3 mmol/L — ABNORMAL LOW (ref 3.5–5.1)
Potassium: 3.4 mmol/L — ABNORMAL LOW (ref 3.5–5.1)
Sodium: 131 mmol/L — ABNORMAL LOW (ref 135–145)
Sodium: 132 mmol/L — ABNORMAL LOW (ref 135–145)

## 2023-10-10 MED ORDER — SODIUM CHLORIDE 0.9 % IV BOLUS
1000.0000 mL | Freq: Once | INTRAVENOUS | Status: AC
Start: 2023-10-10 — End: 2023-10-11
  Administered 2023-10-11: 1000 mL via INTRAVENOUS

## 2023-10-10 MED ORDER — ZIPRASIDONE MESYLATE 20 MG IM SOLR
20.0000 mg | Freq: Once | INTRAMUSCULAR | Status: AC
  Administered 2023-10-10: 20 mg via INTRAMUSCULAR
  Filled 2023-10-10: qty 20

## 2023-10-10 MED ORDER — STERILE WATER FOR INJECTION IJ SOLN
INTRAMUSCULAR | Status: AC
  Filled 2023-10-10: qty 10

## 2023-10-10 MED ORDER — SODIUM CHLORIDE 0.9 % IV BOLUS
2000.0000 mL | Freq: Once | INTRAVENOUS | Status: AC
  Administered 2023-10-10: 2000 mL via INTRAVENOUS

## 2023-10-10 MED ORDER — LACTULOSE 10 GM/15ML PO SOLN: 10.0000 g | Freq: Once | ORAL | Status: DC

## 2023-10-10 NOTE — ED Notes (Signed)
 Received pt with report that restraints were ordered, but pt calmed himself down.

## 2023-10-10 NOTE — ED Provider Notes (Signed)
 [place] EMERGENCY DEPARTMENT AT [facility] Provider Note   CSN: [id number] Arrival date & time: 10/10/23  1309     Patient presents with: Abnormal labs   [name] is a 58 y.o. male.   Patient has encephalopathy secondary to cirrhosis.  He is also being treated for possible endocarditis with IV Ancef  and a PICC line.  He was sent from the nursing home today because his potassium was checked and was elevated.  The history is provided by the nursing home. No language interpreter was used.  Weakness Severity:  Moderate Onset quality: Continued. Timing:  Constant Progression:  Unchanged Chronicity:  Chronic Context: alcohol use   Relieved by:  Nothing Worsened by:  Activity Ineffective treatments:  None tried Associated symptoms: no abdominal pain, no arthralgias, no chest pain, no cough, no dysuria, no fever, no seizures, no shortness of breath and no vomiting        Prior to Admission medications   Medication Sig Start Date End Date Taking? Authorizing Provider  albuterol  (VENTOLIN  HFA) 108 (90 Base) MCG/ACT inhaler INHALE 2 PUFFS BY MOUTH EVERY 6 HOURS AS NEEDED FOR SHORTNESS OF BREATH OR WHEEZING Patient taking differently: Inhale 2 puffs into the lungs every 6 (six) hours as needed for shortness of breath. 12/04/22   Jude Harden GAILS, MD  aspirin  EC 325 MG tablet Take 162.5 mg by mouth every 6 (six) hours as needed (anticoagulant).    [provider]  ceFAZolin  (ANCEF ) IVPB Inject 2 g into the vein every 8 (eight) hours. Indication:  MSSA bacteremia First Dose: Yes Last Day of Therapy:  11/12/23 Labs - Once weekly:  CBC/D and CMP Pull PICC line at the completion of IV antibiotic therapy Method of administration: IV Push or per SNF protocol Method of administration may be changed at the discretion of home infusion pharmacist based upon assessment of the patient and/or caregiver's ability to self-administer the medication ordered. 10/05/23 11/14/23   Maree Bracken D, DO  cyanocobalamin  (V-R VITAMIN B-12) 500 MCG tablet Take 1 tablet (500 mcg total) by mouth every Monday, Wednesday, and Friday. Patient taking differently: Take 500 mcg by mouth daily. 06/12/23   Lamon Pleasant HERO, PA-C  diltiazem  (CARDIZEM  CD) 120 MG 24 hr capsule Take 240 mg by mouth daily. 08/30/23   [provider]  ezetimibe  (ZETIA ) 10 MG tablet Take 1 tablet (10 mg total) by mouth daily. 11/11/19   Dettinger, Fonda LABOR, MD  fluticasone  (FLONASE ) 50 MCG/ACT nasal spray USE 2 SPRAYS IN EACH NOSTRIL EVERY DAY AS NEEDED Patient taking differently: Place 1 spray into both nostrils daily. 01/15/20   Dettinger, Fonda LABOR, MD  folic acid  (FOLVITE ) 800 MCG tablet Take 1 tablet (800 mcg total) by mouth daily. 06/12/23   Lamon Pleasant HERO, PA-C  hydrocortisone  (CORTEF ) 20 MG tablet Take 1 tablet (20 mg total) by mouth 2 (two) times daily. 10/05/23 11/04/23  Maree, Pratik D, DO  lactulose  (CONSTULOSE ) 10 GM/15ML solution TAKE 30 ML BY MOUTH TWICE DAILY Patient taking differently: Take 45 mLs by mouth in the morning, at noon, in the evening, and at bedtime. 10/24/22   Carlan, Mitzie CROME, NP  levothyroxine  (SYNTHROID ) 125 MCG tablet Take 1 tablet (125 mcg total) by mouth daily at 6 (six) AM. 10/06/23 11/05/23  Maree, Pratik D, DO  metoprolol  succinate (TOPROL -XL) 100 MG 24 hr tablet Take 100 mg by mouth in the morning and at bedtime. 08/23/23   [provider]  midodrine (PROAMATINE) 5 MG tablet  Take 5 mg by mouth 2 (two) times daily with a meal.    [provider]  OneTouch Delica Lancets 30G MISC Test BS QID Dx E11.9 05/27/19   Dettinger, Fonda LABOR, MD  pantoprazole  (PROTONIX ) 40 MG tablet TAKE 1 TABLET BY MOUTH TWICE DAILY Patient taking differently: Take 40 mg by mouth daily. 09/04/23   Eartha Angelia Sieving, MD  rosuvastatin (CRESTOR) 5 MG tablet Take 5 mg by mouth daily.    [provider]  spironolactone  (ALDACTONE ) 50 MG tablet Take 1 tablet (50 mg  total) by mouth daily. 09/12/21   Lavona Agent, MD  thiamine  (VITAMIN B1) 100 MG tablet Take 100 mg by mouth daily.    [provider]  torsemide  (DEMADEX ) 20 MG tablet Take 20 mg by mouth daily.    [provider]  ZINC OXIDE, TOPICAL, 10 % CREA Apply 1 Application topically in the morning and at bedtime. Apply to reddened area to groin/scrotum for MASD    [provider]    Allergies: Allopurinol  and Fluoxetine hcl    Review of Systems  Unable to perform ROS: Mental status change  Constitutional:  Negative for chills and fever.  HENT:  Negative for ear pain and sore throat.   Eyes:  Negative for pain and visual disturbance.  Respiratory:  Negative for cough and shortness of breath.   Cardiovascular:  Negative for chest pain and palpitations.  Gastrointestinal:  Negative for abdominal pain and vomiting.  Genitourinary:  Negative for dysuria and hematuria.  Musculoskeletal:  Negative for arthralgias and back pain.  Skin:  Negative for color change and rash.  Neurological:  Negative for seizures and syncope.  All other systems reviewed and are negative.   Updated Vital Signs BP 107/79   Resp 12   Ht 6' (1.829 m)   Wt (!) 168.7 kg   BMI 50.45 kg/m   Physical Exam Vitals and nursing note reviewed.  Constitutional:      Appearance: He is well-developed.  HENT:     Head: Normocephalic.     Nose: Nose normal.     Mouth/Throat:     Mouth: Mucous membranes are moist.  Eyes:     General: No scleral icterus.    Conjunctiva/sclera: Conjunctivae normal.  Neck:     Thyroid : No thyromegaly.  Cardiovascular:     Rate and Rhythm: Normal rate and regular rhythm.     Heart sounds: No murmur heard.    No friction rub. No gallop.  Pulmonary:     Breath sounds: No stridor. No wheezing or rales.  Chest:     Chest wall: No tenderness.  Abdominal:     General: There is no distension.     Tenderness: There is no abdominal tenderness. There is no rebound.   Musculoskeletal:        General: Normal range of motion.     Cervical back: Neck supple.  Lymphadenopathy:     Cervical: No cervical adenopathy.  Skin:    Findings: No erythema or rash.  Neurological:     Mental Status: He is alert.     Motor: No abnormal muscle tone.     Coordination: Coordination normal.     Comments: Oriented to person only  Psychiatric:        Behavior: Behavior normal.     (all labs ordered are listed, but only abnormal results are displayed) Labs Reviewed  BASIC METABOLIC PANEL WITH GFR    EKG: None  Radiology: No results found.  Procedures   Medications Ordered in the ED - No data to display EKG does not show any abnormal lead peaked T waves Clinical Course as of 10/14/23 1708  Thu Oct 10, 2023  1513 Handoff JZ Pt from SNF 2/2 elev K He has ams, cirrhosis, receiving iv abx for endocarditis EKG w/o acute changes Plan to check potassium, treat prn, and dc back to snf if possible  [SG]  1616 Creatinine(!): 1.95 Prior Cr around 1, Cr today 1.95 BUN 28, he does appear dry on exam, favor pre-renal. He was recently placed at facility, give IVF [SG]    Clinical Course User Index [SG] Elnor Jayson LABOR, DO                                 Medical Decision Making Amount and/or Complexity of Data Reviewed Labs: ordered. Decision-making details documented in ED Course. Radiology: ordered. ECG/medicine tests: ordered.  Risk Prescription drug management. Decision regarding hospitalization.   Patient with hepatic encephalopathy from cirrhosis.  Patient sent to the emergency department for elevated potassium.  He is also on IV antibiotics at the nursing home for endocarditis     Final diagnoses:  None    ED Discharge Orders     None          Suzette Pac, MD 10/14/23 1709

## 2023-10-10 NOTE — ED Notes (Signed)
 Pt uncooperative with PICC dressing change. MD notified.

## 2023-10-10 NOTE — ED Notes (Signed)
 ED Provider at bedside.

## 2023-10-10 NOTE — ED Notes (Signed)
 Order for restraints was placed. However pt calmed himself before any restraints were actually applied to pt. Restraints were NEVER applied to pt. Pt is calm at this time.

## 2023-10-10 NOTE — ED Provider Notes (Signed)
  Provider Note MRN:  [id number]  Arrival date & time: 10/10/23    ED Course and Medical Decision Making  Assumed care from dr zammit at shift change.  See note from prior team for complete details, in brief:  Clinical Course as of 10/10/23 2341  Thu Oct 10, 2023  1513 Handoff JZ Pt from SNF 2/2 elev K He has ams, cirrhosis, receiving iv abx for endocarditis EKG w/o acute changes Plan to check potassium, treat prn, and dc back to snf if possible  [SG]  1616 Creatinine(!): 1.95 Prior Cr around 1, Cr today 1.95 BUN 28, he does appear dry on exam, favor pre-renal. He was recently placed at facility, give IVF [SG]    Clinical Course User Index [SG] Elnor Savant A, DO     Patient found to have AKI, after 2 L IV fluids his AKI actually is worsening.  He appears very dry on exam.  This is likely prerenal.  Recommend admission for further IV rehydration 70 difficulty with p.o. intake  Patient's PICC line also appears that infiltrated and will need to be replaced.  He is receiving IV Ancef  for endocarditis at SNF  Admit TR[facility]   .Critical Care  Performed by: Elnor Savant LABOR, DO Authorized by: Elnor Savant LABOR, DO   Critical care provider statement:    Critical care time (minutes):  30   Critical care time was exclusive of:  Separately billable procedures and treating other patients   Critical care was necessary to treat or prevent imminent or life-threatening deterioration of the following conditions:  Dehydration   Critical care was time spent personally by me on the following activities:  Development of treatment plan with patient or surrogate, discussions with consultants, evaluation of patient's response to treatment, examination of patient, ordering and review of laboratory studies, ordering and review of radiographic studies, ordering and performing treatments and interventions, pulse oximetry, re-evaluation of patient's condition, review of old charts and obtaining history from patient  or surrogate   Care discussed with: admitting provider     Final Clinical Impressions(s) / ED Diagnoses     ICD-10-CM   1. AKI (acute kidney injury)  N17.9       ED Discharge Orders     None       Discharge Instructions   None        Elnor Savant LABOR, DO 10/10/23 2341

## 2023-10-10 NOTE — ED Triage Notes (Signed)
 Pt BIB [facility] from [facility] for abnormal K+ level.  EMS reported that it was not reported if K+ is low or high. Pt yelling and uncooperative.

## 2023-10-11 ENCOUNTER — Other Ambulatory Visit: Payer: Self-pay

## 2023-10-11 ENCOUNTER — Inpatient Hospital Stay (HOSPITAL_COMMUNITY)

## 2023-10-11 DIAGNOSIS — R9431 Abnormal electrocardiogram [ECG] [EKG]: Secondary | ICD-10-CM | POA: Diagnosis not present

## 2023-10-11 DIAGNOSIS — K766 Portal hypertension: Secondary | ICD-10-CM | POA: Diagnosis present

## 2023-10-11 DIAGNOSIS — E039 Hypothyroidism, unspecified: Secondary | ICD-10-CM | POA: Insufficient documentation

## 2023-10-11 DIAGNOSIS — Z7401 Bed confinement status: Secondary | ICD-10-CM | POA: Diagnosis not present

## 2023-10-11 DIAGNOSIS — E7220 Disorder of urea cycle metabolism, unspecified: Secondary | ICD-10-CM

## 2023-10-11 DIAGNOSIS — I5032 Chronic diastolic (congestive) heart failure: Secondary | ICD-10-CM

## 2023-10-11 DIAGNOSIS — K7682 Hepatic encephalopathy: Secondary | ICD-10-CM

## 2023-10-11 DIAGNOSIS — I110 Hypertensive heart disease with heart failure: Secondary | ICD-10-CM | POA: Diagnosis not present

## 2023-10-11 DIAGNOSIS — M069 Rheumatoid arthritis, unspecified: Secondary | ICD-10-CM | POA: Diagnosis present

## 2023-10-11 DIAGNOSIS — K7030 Alcoholic cirrhosis of liver without ascites: Secondary | ICD-10-CM | POA: Diagnosis present

## 2023-10-11 DIAGNOSIS — D696 Thrombocytopenia, unspecified: Secondary | ICD-10-CM | POA: Diagnosis not present

## 2023-10-11 DIAGNOSIS — K219 Gastro-esophageal reflux disease without esophagitis: Secondary | ICD-10-CM

## 2023-10-11 DIAGNOSIS — N179 Acute kidney failure, unspecified: Principal | ICD-10-CM

## 2023-10-11 DIAGNOSIS — I330 Acute and subacute infective endocarditis: Secondary | ICD-10-CM | POA: Diagnosis not present

## 2023-10-11 DIAGNOSIS — G9341 Metabolic encephalopathy: Secondary | ICD-10-CM | POA: Diagnosis not present

## 2023-10-11 DIAGNOSIS — Z515 Encounter for palliative care: Secondary | ICD-10-CM

## 2023-10-11 DIAGNOSIS — Z6841 Body Mass Index (BMI) 40.0 and over, adult: Secondary | ICD-10-CM

## 2023-10-11 DIAGNOSIS — Z7189 Other specified counseling: Secondary | ICD-10-CM | POA: Diagnosis not present

## 2023-10-11 DIAGNOSIS — B9561 Methicillin susceptible Staphylococcus aureus infection as the cause of diseases classified elsewhere: Secondary | ICD-10-CM | POA: Diagnosis not present

## 2023-10-11 DIAGNOSIS — E860 Dehydration: Secondary | ICD-10-CM | POA: Diagnosis present

## 2023-10-11 DIAGNOSIS — I339 Acute and subacute endocarditis, unspecified: Secondary | ICD-10-CM

## 2023-10-11 DIAGNOSIS — R531 Weakness: Secondary | ICD-10-CM | POA: Diagnosis not present

## 2023-10-11 DIAGNOSIS — L89610 Pressure ulcer of right heel, unstageable: Secondary | ICD-10-CM | POA: Diagnosis not present

## 2023-10-11 DIAGNOSIS — Z452 Encounter for adjustment and management of vascular access device: Secondary | ICD-10-CM | POA: Diagnosis not present

## 2023-10-11 DIAGNOSIS — I482 Chronic atrial fibrillation, unspecified: Secondary | ICD-10-CM | POA: Diagnosis not present

## 2023-10-11 DIAGNOSIS — D6959 Other secondary thrombocytopenia: Secondary | ICD-10-CM | POA: Diagnosis not present

## 2023-10-11 DIAGNOSIS — E876 Hypokalemia: Secondary | ICD-10-CM

## 2023-10-11 DIAGNOSIS — E871 Hypo-osmolality and hyponatremia: Secondary | ICD-10-CM | POA: Diagnosis not present

## 2023-10-11 DIAGNOSIS — R7881 Bacteremia: Secondary | ICD-10-CM | POA: Diagnosis not present

## 2023-10-11 DIAGNOSIS — I8510 Secondary esophageal varices without bleeding: Secondary | ICD-10-CM | POA: Diagnosis not present

## 2023-10-11 DIAGNOSIS — L89326 Pressure-induced deep tissue damage of left buttock: Secondary | ICD-10-CM | POA: Diagnosis not present

## 2023-10-11 DIAGNOSIS — E875 Hyperkalemia: Secondary | ICD-10-CM | POA: Diagnosis present

## 2023-10-11 DIAGNOSIS — E8809 Other disorders of plasma-protein metabolism, not elsewhere classified: Secondary | ICD-10-CM | POA: Diagnosis present

## 2023-10-11 DIAGNOSIS — I480 Paroxysmal atrial fibrillation: Secondary | ICD-10-CM | POA: Diagnosis present

## 2023-10-11 DIAGNOSIS — I10 Essential (primary) hypertension: Secondary | ICD-10-CM

## 2023-10-11 DIAGNOSIS — L89316 Pressure-induced deep tissue damage of right buttock: Secondary | ICD-10-CM | POA: Diagnosis not present

## 2023-10-11 LAB — COMPREHENSIVE METABOLIC PANEL WITH GFR
ALT: 11 U/L (ref 0–44)
AST: 58 U/L — ABNORMAL HIGH (ref 15–41)
Albumin: 2.7 g/dL — ABNORMAL LOW (ref 3.5–5.0)
Alkaline Phosphatase: 133 U/L — ABNORMAL HIGH (ref 38–126)
Anion gap: 13 (ref 5–15)
BUN: 31 mg/dL — ABNORMAL HIGH (ref 6–20)
CO2: 20 mmol/L — ABNORMAL LOW (ref 22–32)
Calcium: 8.8 mg/dL — ABNORMAL LOW (ref 8.9–10.3)
Chloride: 101 mmol/L (ref 98–111)
Creatinine, Ser: 2 mg/dL — ABNORMAL HIGH (ref 0.61–1.24)
GFR, Estimated: 38 mL/min — ABNORMAL LOW (ref >60)
Glucose, Bld: 122 mg/dL — ABNORMAL HIGH (ref 70–99)
Potassium: 3.8 mmol/L (ref 3.5–5.1)
Sodium: 134 mmol/L — ABNORMAL LOW (ref 135–145)
Total Bilirubin: 3.9 mg/dL — ABNORMAL HIGH (ref 0.0–1.2)
Total Protein: 6.1 g/dL — ABNORMAL LOW (ref 6.5–8.1)

## 2023-10-11 LAB — CBC
HCT: 36.3 % — ABNORMAL LOW (ref 39.0–52.0)
Hemoglobin: 12.3 g/dL — ABNORMAL LOW (ref 13.0–17.0)
MCH: 30.8 pg (ref 26.0–34.0)
MCHC: 33.9 g/dL (ref 30.0–36.0)
MCV: 90.8 fL (ref 80.0–100.0)
Platelets: 36 K/uL — ABNORMAL LOW (ref 150–400)
RBC: 4 MIL/uL — ABNORMAL LOW (ref 4.22–5.81)
RDW: 18.6 % — ABNORMAL HIGH (ref 11.5–15.5)
WBC: 4.8 K/uL (ref 4.0–10.5)
nRBC: 0.4 % — ABNORMAL HIGH (ref 0.0–0.2)

## 2023-10-11 LAB — PHOSPHORUS: Phosphorus: 2.9 mg/dL (ref 2.5–4.6)

## 2023-10-11 LAB — MAGNESIUM: Magnesium: 2.1 mg/dL (ref 1.7–2.4)

## 2023-10-11 MED ORDER — LACTATED RINGERS IV SOLN: INTRAVENOUS | Status: AC

## 2023-10-11 MED ORDER — ADULT MULTIVITAMIN W/MINERALS CH
1.0000 | ORAL_TABLET | Freq: Every day | ORAL | Status: DC
  Administered 2023-10-13 – 2023-10-16 (×4): 1 via ORAL
  Filled 2023-10-11 (×6): qty 1

## 2023-10-11 MED ORDER — VITAMIN C 500 MG PO TABS
500.0000 mg | ORAL_TABLET | Freq: Two times a day (BID) | ORAL | Status: DC
  Administered 2023-10-11 – 2023-10-16 (×8): 500 mg via ORAL
  Filled 2023-10-11 (×11): qty 1

## 2023-10-11 MED ORDER — PROSOURCE PLUS PO LIQD
30.0000 mL | Freq: Three times a day (TID) | ORAL | Status: DC
  Administered 2023-10-11 – 2023-10-16 (×11): 30 mL via ORAL
  Filled 2023-10-11 (×14): qty 30

## 2023-10-11 MED ORDER — LACTULOSE ENEMA
300.0000 mL | Freq: Once | ORAL | Status: AC
  Administered 2023-10-12: 300 mL via RECTAL
  Filled 2023-10-11: qty 300

## 2023-10-11 MED ORDER — LEVOTHYROXINE SODIUM 125 MCG PO TABS
125.0000 ug | ORAL_TABLET | Freq: Every day | ORAL | Status: DC
  Administered 2023-10-15 – 2023-10-16 (×2): 125 ug via ORAL
  Filled 2023-10-11 (×3): qty 1

## 2023-10-11 MED ORDER — PROCHLORPERAZINE EDISYLATE 10 MG/2ML IJ SOLN: 10.0000 mg | Freq: Four times a day (QID) | INTRAMUSCULAR | Status: DC | PRN

## 2023-10-11 MED ORDER — LACTATED RINGERS IV SOLN: INTRAVENOUS | Status: DC

## 2023-10-11 MED ORDER — MIDODRINE HCL 5 MG PO TABS
5.0000 mg | ORAL_TABLET | Freq: Two times a day (BID) | ORAL | Status: DC
  Administered 2023-10-11 – 2023-10-16 (×9): 5 mg via ORAL
  Filled 2023-10-11 (×11): qty 1

## 2023-10-11 MED ORDER — LACTULOSE 10 GM/15ML PO SOLN
30.0000 g | Freq: Three times a day (TID) | ORAL | Status: DC
  Administered 2023-10-11 – 2023-10-16 (×11): 30 g via ORAL
  Filled 2023-10-11 (×18): qty 60

## 2023-10-11 MED ORDER — ZINC SULFATE 220 (50 ZN) MG PO CAPS
220.0000 mg | ORAL_CAPSULE | Freq: Every day | ORAL | Status: DC
  Administered 2023-10-11 – 2023-10-16 (×5): 220 mg via ORAL
  Filled 2023-10-11 (×6): qty 1

## 2023-10-11 MED ORDER — SODIUM CHLORIDE 0.9% FLUSH
10.0000 mL | Freq: Two times a day (BID) | INTRAVENOUS | Status: DC
  Administered 2023-10-11: 20 mL
  Administered 2023-10-11 – 2023-10-16 (×8): 10 mL

## 2023-10-11 MED ORDER — MEDIHONEY WOUND/BURN DRESSING EX PSTE
1.0000 | PASTE | Freq: Every day | CUTANEOUS | Status: DC
  Administered 2023-10-11 – 2023-10-16 (×6): 1 via TOPICAL
  Filled 2023-10-11: qty 44

## 2023-10-11 MED ORDER — POTASSIUM CHLORIDE 10 MEQ/100ML IV SOLN
10.0000 meq | INTRAVENOUS | Status: AC
  Administered 2023-10-11 (×2): 10 meq via INTRAVENOUS
  Filled 2023-10-11: qty 100

## 2023-10-11 MED ORDER — PANTOPRAZOLE SODIUM 40 MG PO TBEC
40.0000 mg | DELAYED_RELEASE_TABLET | Freq: Two times a day (BID) | ORAL | Status: DC
  Administered 2023-10-11: 40 mg via ORAL
  Filled 2023-10-11 (×4): qty 1

## 2023-10-11 MED ORDER — CEFAZOLIN SODIUM-DEXTROSE 2-4 GM/100ML-% IV SOLN
2.0000 g | Freq: Three times a day (TID) | INTRAVENOUS | Status: DC
Start: 2023-10-11 — End: 2023-11-12
  Administered 2023-10-11 – 2023-10-16 (×16): 2 g via INTRAVENOUS
  Filled 2023-10-11 (×16): qty 100

## 2023-10-11 MED ORDER — GERHARDT'S BUTT CREAM
TOPICAL_CREAM | Freq: Two times a day (BID) | CUTANEOUS | Status: DC
  Administered 2023-10-12 – 2023-10-13 (×2): 1 via TOPICAL
  Filled 2023-10-11 (×2): qty 60

## 2023-10-11 MED ORDER — SODIUM CHLORIDE 0.9% FLUSH: 10.0000 mL | INTRAVENOUS | Status: DC | PRN

## 2023-10-11 MED ORDER — CHLORHEXIDINE GLUCONATE CLOTH 2 % EX PADS
6.0000 | MEDICATED_PAD | Freq: Every day | CUTANEOUS | Status: DC
  Administered 2023-10-11 – 2023-10-16 (×6): 6 via TOPICAL

## 2023-10-11 MED ORDER — CEFAZOLIN IV (FOR PTA / DISCHARGE USE ONLY): 2.0000 g | Freq: Three times a day (TID) | INTRAVENOUS | Status: DC

## 2023-10-11 MED ORDER — PANTOPRAZOLE SODIUM 40 MG IV SOLR
40.0000 mg | Freq: Once | INTRAVENOUS | Status: AC
  Administered 2023-10-11: 40 mg via INTRAVENOUS
  Filled 2023-10-11: qty 10

## 2023-10-11 MED ORDER — ENSURE PLUS HIGH PROTEIN PO LIQD
237.0000 mL | Freq: Two times a day (BID) | ORAL | Status: DC
  Administered 2023-10-11 – 2023-10-16 (×3): 237 mL via ORAL

## 2023-10-11 NOTE — Progress Notes (Signed)
 [facility], legal guardian, returned call at 1211, gave verbal permission to insert PICC line. B.Leora LPN was second Training and development officer.

## 2023-10-11 NOTE — Progress Notes (Addendum)
 Right SL PICC is originally out 3 cm. Pulled another 3 cm per Dr Norleen Boxer Radiologist. Total of 6cm out. Need 2 RN to place the PICC, patient is combative and strong. PICC RN will follow up to replace the PICC in AM. RN made aware.

## 2023-10-11 NOTE — ED Notes (Addendum)
 Unable to flush PICC. PICC Nurse and EDP notified of possible dislodgment of PICC line.

## 2023-10-11 NOTE — Consult Note (Addendum)
 WOC Nurse Consult Note: Reason for Consult: foot wounds  Wound type: 1.  Full thickness wounds L toes 1-4 and L lateral foot with dark hemorrhagic scabbed tissue likely r/t PAD or trauma  2.  Full thickness R foot great toe and 2nd digit dry eschar  3.  Moisture associated skin damage to buttocks/sacrum widespread erythema with scattered partial thickness skin loss  4.  R lateral ankle full thickness r/t PAD 60% black 20% red 20% tan  5.  R heel unstageable Pressure Injury black eschar  Pressure Injury POA: Yes, R heel only pressure wound  Measurement: see nursing flowsheet  Wound bed: as above  Drainage (amount, consistency, odor) see nursing flowsheet  Periwound: peeling skin to feet with ? Petechia/ecchymosis widespread to dorsal aspects (noted to have thrombocytopenia); buttocks also with questionable petechiae Dressing procedure/placement/frequency: Cleanse B feet with soap and water , paint all wounds to toes and L lateral foot with Betadine 2 times daily.  May leave open to air or wrap with dry gauze whichever preferred.  Cleanse R lateral ankle wound with Vashe wound cleanser Soila [phone]) do not rinse and allow to air dry.  Apply Medihoney to wound bed daily, cover with dry gauze and secure with silicone foam.  Cleanse R heel with Vashe, apply Xeroform gauze Soila [phone]) to wound bed daily and secure with silicone foam or Kerlix roll guaze whichever preferred.   Place B feet in Prevalon boots to offload pressure.  Gerlean #[id number]  Gerhardt's written for buttocks/sacrum MASD.   POC discussed with bedside nurse. WOC team will not follow. Re-consult if further needs arise.   Thank you,    Powell Bar MSN, RN-BC, [employer]

## 2023-10-11 NOTE — Progress Notes (Signed)
 Spoke to Norleen Boxer MD re: Right PICC line placement Tip location. Pull 3 cm out to CAJ per Dr Norleen Boxer.

## 2023-10-11 NOTE — Progress Notes (Signed)
 Patient arrived to room 332 from ED.  Assessment complete, VS obtained, and Admission database began.

## 2023-10-11 NOTE — H&P (Incomplete)
 History and Physical    Patient: [name] FMW:[id number] DOB: 04-02-1965 DOA: 10/10/2023 DOS: the patient was seen and examined on 10/11/2023 PCP: Carleen Dallas PARAS, FNP (Inactive)  Patient coming from: {Point_of_Origin:[address]}  Chief Complaint:  Chief Complaint  Patient presents with   Abnormal labs   HPI: [name] is a 58 y.o. male with medical history significant of ***  Review of Systems: {ROS_Text:26778} Past Medical History:  Diagnosis Date   Anxiety    Atrial fibrillation (HCC)    Blood clot in vein    blood clot in portal vein   CHF (congestive heart failure) (HCC)    Cirrhosis (HCC)    NASH   Constipation    COPD (chronic obstructive pulmonary disease) (HCC)    Dysrhythmia    GERD (gastroesophageal reflux disease)    Gout    Leukopenia 07/08/2015   Neuromuscular disorder (HCC)    neuropathy in hands and feet   Psoriasis    RA (rheumatoid arthritis) (HCC)    Sleep apnea    cpap used- level 10 and greater   Thrombocytopenia    Past Surgical History:  Procedure Laterality Date   ANKLE SURGERY     right ankle talor repair   CHOLECYSTECTOMY  sept 2016   CHOLECYSTECTOMY     COLONOSCOPY  05/08/2011   Procedure: COLONOSCOPY;  Surgeon: Claudis RAYMOND Rivet, MD;  [facility];  Service: Endoscopy;  Laterality: N/A;  730   COLONOSCOPY WITH PROPOFOL  N/A 05/27/2020   Procedure: COLONOSCOPY WITH PROPOFOL ;  Surgeon: Eartha Angelia Sieving, MD;  [facility];  Service: Gastroenterology;  Laterality: N/A;  Patient needs a unit of platelets prior to procedure.   ESOPHAGEAL BANDING N/A 05/27/2020   Procedure: ESOPHAGEAL BANDING;  Surgeon: Eartha Angelia Sieving, MD;  [facility];  Service: Gastroenterology;  Laterality: N/A;   ESOPHAGOGASTRODUODENOSCOPY  02/28/2011   Procedure: ESOPHAGOGASTRODUODENOSCOPY (EGD);  Surgeon: Claudis RAYMOND Rivet, MD;  [facility];  Service: Endoscopy;  Laterality: N/A;  1200    ESOPHAGOGASTRODUODENOSCOPY N/A 06/11/2012   Procedure: ESOPHAGOGASTRODUODENOSCOPY (EGD);  Surgeon: Claudis RAYMOND Rivet, MD;  [facility];  Service: Endoscopy;  Laterality: N/A;  1200  FYI patient is 400 pounds   ESOPHAGOGASTRODUODENOSCOPY (EGD) WITH PROPOFOL  N/A 04/21/2014   Procedure: ESOPHAGOGASTRODUODENOSCOPY (EGD) WITH PROPOFOL ;  Surgeon: Claudis RAYMOND Rivet, MD;  Location: [facility];  Service: Endoscopy;  Laterality: N/A;   ESOPHAGOGASTRODUODENOSCOPY (EGD) WITH PROPOFOL  N/A 05/27/2020   Procedure: ESOPHAGOGASTRODUODENOSCOPY (EGD) WITH PROPOFOL ;  Surgeon: Eartha Angelia Sieving, MD;  [facility];  Service: Gastroenterology;  Laterality: N/A;  7:30 am   ESOPHAGOGASTRODUODENOSCOPY (EGD) WITH PROPOFOL  N/A 07/29/2020   Procedure: ESOPHAGOGASTRODUODENOSCOPY (EGD) WITH PROPOFOL ;  Surgeon: Eartha Angelia Sieving, MD;  [facility];  Service: Gastroenterology;  Laterality: N/A;  8:20   ESOPHAGOGASTRODUODENOSCOPY (EGD) WITH PROPOFOL  N/A 07/31/2022   Procedure: ESOPHAGOGASTRODUODENOSCOPY (EGD) WITH PROPOFOL ;  Surgeon: Eartha Angelia Sieving, MD;  [facility];  Service: Gastroenterology;  Laterality: N/A;  9:30am;asa 3   HERNIA REPAIR Right    as child; inguinal   HERNIA REPAIR  sept 2016   LIVER BIOPSY  2012   SCIATIC NERVE EXPLORATION     TONSILLECTOMY     Social History:  reports that he has never smoked. His smokeless tobacco use includes snuff and chew. He reports current alcohol use of about 4.0 standard drinks of alcohol per week. He reports that he does not use drugs.  Allergies  Allergen Reactions  Allopurinol  Other (See Comments)    Joint pain worse Caused worsening gout   Fluoxetine Hcl Itching    Family History  Problem Relation Age of Onset   Colon cancer Mother    Cancer Mother        intestine   Cancer Father        throat   Cancer Brother        brain   Cancer Sister    Heart attack Neg Hx    Stroke Neg Hx     Prior to  Admission medications   Medication Sig Start Date End Date Taking? Authorizing Provider  albuterol  (VENTOLIN  HFA) 108 (90 Base) MCG/ACT inhaler INHALE 2 PUFFS BY MOUTH EVERY 6 HOURS AS NEEDED FOR SHORTNESS OF BREATH OR WHEEZING 12/04/22  Yes Jude Harden GAILS, MD  aspirin  EC 325 MG tablet Take 162.5 mg by mouth daily as needed (anticoagulant).   Yes [provider]  ceFAZolin  (ANCEF ) IVPB Inject 2 g into the vein every 8 (eight) hours. Indication:  MSSA bacteremia First Dose: Yes Last Day of Therapy:  11/12/23 Labs - Once weekly:  CBC/D and CMP Pull PICC line at the completion of IV antibiotic therapy Method of administration: IV Push or per SNF protocol Method of administration may be changed at the discretion of home infusion pharmacist based upon assessment of the patient and/or caregiver's ability to self-administer the medication ordered. 10/05/23 11/14/23 Yes Maree, Pratik D, DO  cyanocobalamin  (V-R VITAMIN B-12) 500 MCG tablet Take 1 tablet (500 mcg total) by mouth every Monday, Wednesday, and Friday. 06/12/23  Yes Pennington, Rebekah M, PA-C  diltiazem  (CARDIZEM  CD) 120 MG 24 hr capsule Take 240 mg by mouth daily. 08/30/23  Yes [provider]  ezetimibe  (ZETIA ) 10 MG tablet Take 1 tablet (10 mg total) by mouth daily. 11/11/19  Yes Dettinger, Fonda LABOR, MD  fluticasone  (FLONASE ) 50 MCG/ACT nasal spray USE 2 SPRAYS IN EACH NOSTRIL EVERY DAY AS NEEDED 01/15/20  Yes Dettinger, Fonda LABOR, MD  folic acid  (FOLVITE ) 800 MCG tablet Take 1 tablet (800 mcg total) by mouth daily. 06/12/23  Yes Pennington, Rebekah M, PA-C  Heparin  Sod, Pork, Lock Flush (HEPARIN  LOCK FLUSH IV) Inject 3 mLs into the vein every 8 (eight) hours. Use 3 mL intravenously every 8 hours for flush of non-valved catheter lumens after med admin. Flush with 10 mL of normal saline before med; 10 mL normal saline after med; followed by heparin .   Yes [provider]  hydrocortisone  (CORTEF ) 20 MG tablet Take 1 tablet (20 mg  total) by mouth 2 (two) times daily. 10/05/23 11/04/23 Yes Shah, Pratik D, DO  lactulose  (CONSTULOSE ) 10 GM/15ML solution TAKE 30 ML BY MOUTH TWICE DAILY Patient taking differently: Take 45 mLs by mouth 3 (three) times daily. 10/24/22  Yes Carlan, Chelsea L, NP  levothyroxine  (SYNTHROID ) 125 MCG tablet Take 1 tablet (125 mcg total) by mouth daily at 6 (six) AM. 10/06/23 11/05/23 Yes Shah, Pratik D, DO  metoprolol  succinate (TOPROL -XL) 100 MG 24 hr tablet Take 100 mg by mouth in the morning and at bedtime. 08/23/23  Yes [provider]  midodrine (PROAMATINE) 5 MG tablet Take 5 mg by mouth 2 (two) times daily with a meal.   Yes [provider]  pantoprazole  (PROTONIX ) 40 MG tablet TAKE 1 TABLET BY MOUTH TWICE DAILY 09/04/23  Yes Castaneda Mayorga, Toribio, MD  potassium chloride  SA (KLOR-CON  M) 20 MEQ tablet Take 20 mEq by mouth daily.   Yes [provider]  rosuvastatin (CRESTOR) 5 MG tablet Take 5 mg by mouth daily.   Yes [provider]  Sodium Chloride  Flush (NORMAL SALINE FLUSH IV) Inject 10 mLs into the vein every 8 (eight) hours.   Yes [provider]  spironolactone  (ALDACTONE ) 50 MG tablet Take 1 tablet (50 mg total) by mouth daily. 09/12/21  Yes Lavona Agent, MD  thiamine  (VITAMIN B1) 100 MG tablet Take 100 mg by mouth daily.   Yes [provider]  torsemide  (DEMADEX ) 20 MG tablet Take 20 mg by mouth daily.   Yes [provider]  OneTouch Delica Lancets 30G MISC Test BS QID Dx E11.9 05/27/19   Dettinger, Fonda LABOR, MD  ZINC OXIDE, TOPICAL, 10 % CREA Apply 1 Application topically in the morning and at bedtime. Apply to reddened area to groin/scrotum for MASD Patient not taking: Reported on 10/10/2023    [provider]    Physical Exam: Vitals:   10/10/23 1431 10/10/23 2100 10/10/23 2300 10/10/23 2315  BP:  113/76 107/79 113/64  Pulse:  60  (!) 52  Resp: 12 19 11 13   SpO2:  100%  100%  Weight:      Height:        *** Data Reviewed: {Tip this will not be part of the note when signed- Document your independent interpretation of telemetry tracing, EKG, lab, Radiology test or any other diagnostic tests. Add any new diagnostic test ordered today. (Optional):26781} {Results:26384}  Assessment and Plan: No notes have been filed under this hospital service. Service: Hospitalist     Advance Care Planning:   Code Status: Prior ***  Consults: ***  Family Communication: ***  Severity of Illness: {Observation/Inpatient:21159}  Author: Posey Maier, DO 10/11/2023 12:00 AM  For on call review [email or web address].

## 2023-10-11 NOTE — Progress Notes (Signed)
 Initial Nutrition Assessment  DOCUMENTATION CODES:   Morbid obesity  INTERVENTION:   -Liberalize diet to 2 gram sodium for wider variety of meal selections -MVI with minerals daily -500 mg vitamin C BID -220 mg zinc sulfate daily x 14 days -Ensure Plus High Protein po BID, each supplement provides 350 kcal and 20 grams of protein  -30 ml Prosource Plus TID, each supplement provides 100 kcals and 15 grams -Draw labs to assess for potential micronutrient deficiencies which may impede wound healing: vitamin A  NUTRITION DIAGNOSIS:   Increased nutrient needs related to wound healing as evidenced by estimated needs.  GOAL:   Patient will meet greater than or equal to 90% of their needs  MONITOR:   PO intake, Supplement acceptance  REASON FOR ASSESSMENT:   Malnutrition Screening Tool    ASSESSMENT:   Pt with medical history significant of CHF, atrial fibrillation, diastolic CHF, asthma,  liver cirrhosis with esophageal varices and history of hepatic encephalopathy who presents from [facility] due to potassium elevation, patient was receiving IV antibiotics via PICC line for presumed endocarditis due to MSSA bacteremia.  Pt admitted with AKI.   Reviewed I/O's: -850 ml x 24 hours  UOP: 1.3 L x 24 hours  Pt unavailable at time of visit. Attempted to speak with pt via call to hospital room phone, however, unable to reach. RD unable to obtain further nutrition-related history or complete nutrition-focused physical exam at this time.    Reached out to RN to discuss pt.   Case discussed in interdisciplinary rounds. Pt with AKI and will require IV fluids. MD anticipates a few day LOS.   Per [facility] notes, pt with full thickness wounds to lt toes (1-4) and lt lateral foot (likely related to PAD or trauma); full thickness wounds to rt foot and 2nd digit; MASD to buttocks and sacrum with partial thickness skin loss; full thickness wound to rt lateral ankle; unstageable pressure  injury to rt heel.   Pt currently on a heart healthy, carb modified diet. No meal completion data available to assess at this time.   Reviewed wt hx; per CareEverywhere, wt was 385# on 09/05/23 encounter. Pt has experienced a 7.8% wt loss over the past 6 weeks, which is significant for time frame. Pt is at high risk for malnutrition given significant wt loss, multiple cor-morbidities, and pressure injuries, however, unable to identify at this time.   Medications reviewed and include lactulose , protonix , and lactated ringers  infusion @ 83 ml/hr.   Palliative care consult pending for goals of care.   Labs reviewed: Na: 134.   Diet Order:   Diet Order             Diet heart healthy/carb modified Room service appropriate? Yes; Fluid consistency: Thin  Diet effective now                   EDUCATION NEEDS:   No education needs have been identified at this time  Skin:  Skin Assessment: Skin Integrity Issues: Skin Integrity Issues:: Other (Comment), Unstageable Unstageable: rt heel Other: full thickness wounds to lt toes (1-4), lt lateral foot (likely related to PAD or trauma), rt foot and 2nd digit, rt lateral ankle;  MASD to buttocks and sacrum with partial thickness skin loss  Last BM:  Unknown  Height:   Ht Readings from Last 1 Encounters:  10/10/23 6' (1.829 m)    Weight:   Wt Readings from Last 1 Encounters:  10/11/23 (!) 161.3 kg  Ideal Body Weight:  80.9 kg  BMI:  Body mass index is 48.23 kg/m.  Estimated Nutritional Needs:   Kcal:  2400-2600  Protein:  125-150 grams  Fluid:  2.0-2.2 L    Margery ORN, RD, LDN, CDCES Registered Dietitian III Certified Diabetes Care and Education Specialist If unable to reach this RD, please use RD Inpatient group chat on secure chat between hours of 8am-4 pm daily

## 2023-10-11 NOTE — Progress Notes (Signed)
 Patient seen and evaluated, chart reviewed, please see EMR for updated orders. Please see full H&P dictated by admitting physician Dr Adefeso for same date of service.   Brief Summary:- 58 y.o. male with medical history significant of CHF, atrial fibrillation, diastolic CHF, asthma,  liver cirrhosis with esophageal varices and history of hepatic encephalopathy admitted on 10/11/2023 with AKI in the setting of dehydration as well hepatic encephalopathy with poor oral intake   A/p  Acute kidney injury Dehydration Creatinine 1.95 > 2.02 (baseline creatinine at 0.8-1.1) Continue gentle hydration Renally adjust medications, avoid nephrotoxic agents/dehydration/hypotension   Prolonged QT interval QTc 531 ms Avoid QT prolonging drugs Avoid electrolyte derangement   Hepatic encephalopathy in the setting of hyperammonemia Ammonia 47 Continue lactulose  Geodon  20 mg IM x 1 was given in the ED Chest x-ray suggestive of hypoventilatory changes with bandlike atelectasis or scarring in the left hilar area Continue incentive spirometry Continue to hold buspirone , gabapentin , doxepin    Hypokalemia K+ 3.3, pleasant recheck   Presumed acute endocarditis Patient was noted to be positive for MSSA bacteremia during last admission, unfortunately TEE cannot be done due to history of esophageal varices and thrombocytopenia, so he was started on presumptive endocarditis with placement of PICC line with need for 6-week course of IV antibiotics per ID recommendations. Continue IV Ancef    Thrombocytopenia (chronic) Platelets 56, continue to monitor platelets with morning labs   Right foot wound Continue wound care   Chronic diastolic CHF Echocardiogram done on 10/02/2023 showed LVEF of 70 to 75%.  No WMA.  Moderate asymmetric LVH.  Indeterminate LV diastolic parameters.   Chronic atrial fibrillation EKG showed normal sinus rhythm Patient is not on anticoagulation due to high risk for GI bleed from  esophageal varices and chronic thrombocytopenia Home Coreg  and Cardizem  will be held at this time due to soft BP   Essential hypertension Hold BP meds due to soft BP Continue midodrine   Acquired hypothyroidism Continue Synthroid    GERD Continue Protonix    Cirrhosis Patient with history of alcoholic liver cirrhosis with history of splenic, mesenteric and portal vein thrombosis, thrombocytopenia, portal hypertension with esophageal varices, hepatic encephalopathy Continue lactulose    Morbid obesity (BMI 50.45) Diet and lifestyle modification - Complicates overall care  Social/ethics--patient is from [facility] facility - He has a state appointed legal guardian - Palliative care consult appreciated - Overall prognosis is poor - Patient remains a full code for now pending CODE STATUS decisions by legal guardian  Patient seen and evaluated, chart reviewed, please see EMR for updated orders. Please see full H&P dictated by admitting physician Dr Adefeso for same date of service.   Total care time 49 minutes Ruther Carwin, MD

## 2023-10-11 NOTE — TOC Initial Note (Signed)
 Transition of Care [facility]) - Initial/Assessment Note    Patient Details  Name: [name] MRN: [id number] Date of Birth: 07-Feb-1965  [facility] of [facility]) CM/SW Contact:    Noreen KATHEE Pinal, LCSWA Phone Number: 10/11/2023, 1:06 PM  Clinical Narrative:                  [facility] came from [facility]- [facility] for short-term rehab through his insurance. CSW did speak with [facility] who confirmed patient being able to return once medically stable and that they would need auth for him to return through his [facility] plan. Patient was just appointed a LG back on 10/01/23 and was approved to DC to [facility]. Jennine Hill was contacted via phone and message to call CSW . CSW will continue to follow.   Expected Discharge Plan: Skilled Nursing Facility Barriers to Discharge: Continued Medical Work up   Patient Goals and CMS Choice Patient states their goals for this hospitalization and ongoing recovery are:: return back to [facility] CMS Medicare.gov Compare Post Acute Care list provided to:: Patient Represenative (must comment) Choice offered to / list presented to : [facility] POA / Guardian      Expected Discharge Plan and Services     Post Acute Care Choice: Skilled Nursing Facility                                        Prior Living Arrangements/Services   Lives with:: Facility Resident Patient language and need for interpreter reviewed:: Yes Do you feel safe going back to the place where you live?: Yes (Back to [facility])      Need for Family Participation in Patient Care: Yes (Comment) Care giver support system in place?: Yes (comment)   Criminal Activity/Legal Involvement Pertinent to Current Situation/Hospitalization: No - Comment as needed  Activities of Daily Living      Permission Sought/Granted      Share Information with NAME: Everett Potters     Permission granted to share info w Relationship: DSS     Emotional Assessment Appearance:: Appears stated age     Orientation: : Oriented to  Self Alcohol / Substance Use: Not Applicable Psych Involvement: No (comment)  Admission diagnosis:  AKI (acute kidney injury) [N17.9] Acute kidney injury [N17.9] Patient Active Problem List   Diagnosis Date Noted   Acute kidney injury 10/11/2023   Prolonged QT interval 10/11/2023   Hyperammonemia 10/11/2023   Acute endocarditis 10/11/2023   Acquired hypothyroidism 10/11/2023   GERD (gastroesophageal reflux disease) 10/11/2023   Staphylococcus aureus bacteremia 10/01/2023   Gram-positive bacteremia 09/30/2023   Paroxysmal atrial fibrillation with RVR (HCC) 09/29/2023   Hepatic encephalopathy (HCC) 09/29/2023   Acute metabolic encephalopathy 09/25/2023   Hyponatremia 04/04/2021   Esophageal varices (HCC) 04/28/2020   History of colonic polyps 04/28/2020   Alcohol abuse 04/28/2020   Acute shoulder pain due to trauma, right 10/12/2019   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 10/12/2019   Localized osteoarthritis of wrist 10/12/2019   Nontraumatic complete tear of right rotator cuff 10/12/2019   Cocaine abuse (HCC) 09/09/2019   1st degree AV block 08/17/2019   Lyme disease 08/17/2019   Fever of unknown origin 08/16/2019   Elevated AST (SGOT) 08/04/2019   Dyslipidemia 07/14/2019   Iron  deficiency anemia 07/08/2019   Migraine without aura 05/18/2019   Acute on chronic diastolic CHF (congestive heart failure) (HCC) 05/14/2019  Scrotal edema 04/22/2019   Bilateral hydrocele 04/22/2019   Pancytopenia (HCC)    Long-term current use of opiate analgesic 03/21/2019   Dizziness 03/21/2019   Meralgia paresthetica of left side 03/21/2019   Polyarthropathy 03/21/2019   Arm mass, right 07/31/2017   Bilateral primary osteoarthritis of knee 04/04/2017   Depression, recurrent 07/18/2016   Encounter for screening for other viral diseases 06/20/2016   Mixed hyperlipidemia 10/31/2015   Leukopenia 07/08/2015   Abdominal wall bulge 03/02/2015   Portal hypertension (HCC)  08/24/2014   Hypokalemia 08/24/2014   COPD, severity to be determined (HCC) 08/24/2014   Biliary colic 07/14/2014   CHF (congestive heart failure) (HCC) 07/14/2014   H/O ETOH abuse 07/14/2014   Thrombocytopenia 07/30/2013   Chronic pain disorder 07/30/2013   Edema 07/30/2013   Genu varus    Hypogonadism male 05/05/2012   Umbilical hernia without obstruction and without gangrene 12/26/2011   Asthma 08/28/2011   Cirrhosis (HCC) 08/14/2011   UNSPECIFIED TACHYCARDIA 02/01/2010   Gout 05/22/2008   Hypertension, essential 05/22/2008   Atrial fibrillation, chronic (HCC) 05/22/2008   DIASTOLIC HEART FAILURE, CHRONIC 05/22/2008   Acute on chronic diastolic heart failure (HCC) 05/22/2008   Morbid obesity with BMI of 50.0-59.9, adult (HCC) 02/21/2007   Coronary atherosclerosis 02/21/2007   Obstructive sleep apnea of adult 02/04/2007   PCP:  Carleen Dallas PARAS, FNP (Inactive) Pharmacy:   [facility] - [place], [place] - [address] [place] [address] Phone: [phone] Fax: [phone]  [facility] - [place], [place] - [address] [place] [address] Phone: [phone] Fax: [phone]  [facility] - [place], [place] - [address] [place] [address] Phone: [phone] Fax: [phone]     Social Drivers of Health (SDOH) Social History: SDOH Screenings   Food Insecurity: Patient Unable To Answer (09/25/2023)  Housing: Low Risk  (09/30/2023)  Transportation Needs: Patient Unable To Answer (09/25/2023)  Utilities: Patient Unable To Answer (09/25/2023)  Recent Concern: Utilities - At Risk (08/18/2023)   Received from [facility]  Depression (PHQ2-9): Low Risk  (03/07/2020)  Financial Resource Strain: Low Risk  (07/22/2023)   Received from [facility]  Physical Activity: Inactive (07/22/2023)   Received from [facility]  Social Connections: Moderately Isolated  (07/22/2023)   Received from [facility]  Stress: No Stress Concern Present (08/18/2023)   Received from [facility]  Tobacco Use: High Risk (10/10/2023)  Health Literacy: Low Risk  (07/22/2023)   Received from [facility]   SDOH Interventions:     Readmission Risk Interventions    10/03/2023    9:50 AM  Readmission Risk Prevention Plan  Transportation Screening Complete  HRI or Home Care Consult Complete  Social Work Consult for Recovery Care Planning/Counseling Complete  Palliative Care Screening Not Applicable  Medication Review Oceanographer) Complete

## 2023-10-11 NOTE — Consult Note (Signed)
 Consultation Note Date: 10/11/2023   Patient Name: [name]  DOB: [birth year]  MRN: [id number]  Age / Sex: 58 y.o., male  PCP: Carleen Dallas PARAS, FNP (Inactive) Referring Physician: Pearlean Manus, MD  Reason for Consultation: Establishing goals of care  HPI/Patient Profile: 58 y.o. male  with past medical history of CHF, atrial fibrillation, diastolic CHF, asthma,  liver cirrhosis with esophageal varices and history of hepatic encephalopathy admitted on 10/10/2023 with hypokalemia.  Workup revealed AKI for which she is receiving IV fluids.  Hypokalemia for which replacement has been ordered.  Also noted to have altered mental status felt to be possibly related to hepatic encephalopathy in the setting of hyperammonemia for which he is being treated with lactulose .  Has right foot wound.  Patient was recent hospitalized from 09/25/2023 through 10/05/2023 for acute metabolic encephalopathy, myxedema, and Warnicke's encephalopathy.  Also noted to have MSSA bacteremia.  On 6 weeks of IV antibiotics for endocarditis as unable to complete TEE due to presence of esophageal varices.  During this hospitalization, emergency guardianship had to be obtained due to patient's lack of decision making capacity and no established healthcare surrogate.  He was ultimately discharged to SNF.  PMT has been consulted to assist with goals of care conversation.  Labs reviewed.  Patient has some mild hyponatremia.  Potassium level has normalized from admission level of 3.3 on admission to 3.8.  Continue replacement as indicated.  CO2 level slightly low but improving.  Renal function elevated with creatinine of 2 (2.2<<1.95 on admission).  Currently on IV hydration would recommend continuing to trend.  Calcium level low in the setting of hypoalbuminemia calcium corrects to 9.8 mg/dL.  Albumin  level low at 2.7. Low albumin  levels are associated with greater disease burden and poorer  long-term prognosis.  ALP and AST elevated.  Total bilirubin also elevated at 3.9.  CBC reveals normal white count. Hemoglobin 12.3 g/dL (down from yesterday's reading of 14.1) could be dilutional, it appears from prior hospitalization that hemoglobin range has typically been in the 11 to 12 g/dL.  Monitoring recommended.  Chest x-ray independently reviewed.  Hypoinflation of the lungs noted.  PICC line visible and appears to be within the right atrium.  No evidence of pneumonia.  EKG independently reviewed.  QTc prolonged at 531.  Would use caution with QTc prolonging medications.  Vital signs reviewed and appear relatively stable with some mild bradycardia.  Did have some suicide blood pressure readings in the 90s on admission but this is improved with IV fluids.  Satting well on room air.  Medication administration record reviewed.  No as needed symptom meds given on 24-hour look back.  Today, patient does not respond to questioning.  Therefore he is unable to provide any data for HPI/ROS.  Independent history obtained from nursing and other care team members due to patient's cognitive status.   Nursing stated patient is confused and unable to answer questions. He is also lethargic. Had some agitation but this has improved.   Spoke with patient's primary NP at [facility]. She states she referred patient to the hospital due to hypokalemia. She notes poor nutritional and functional status and that he has made little to no progress there at the facility. She feels he would be appropriate for hospice level of care but has had difficulty speaking with the guardian.    Clinical Assessment and Goals of Care:  I have reviewed medical records including EPIC notes, labs and imaging (independently reviewed), medication administration record  vital signs, assessed the patient and then called patient's guardian to discuss diagnosis prognosis, GOC, EOL wishes, disposition and options. Collaborated directly with  attending physician, bedside nursing staff, and TOC.   Called guardian with no answer. Left VM requesting urgent call back.  Update: Received call back from patient's guardian.  See below for discussion.    Medical History Review and Family/Patient Understanding:   I introduced Palliative Medicine as specialized medical care for people living with serious illness. It focuses on providing relief from the symptoms and stress of a serious illness. The goal is to improve quality of life. She reports that patient continues under emergency guardianship and confirms she is his legal guardian.  Engaged in a detailed conversation with the guardian about the seriousness of patient's current illness, in light of his complex medical history, and explained the potential implications this may have for his recovery and long-term well-being.  We also spoke of his recurrent hospitalizations and declining clinical status.  Discussed concern for poor prognosis.  Legal guardian verbalized understanding.  Social History:  Resides at skilled nursing facility, has some emergency contacts listed in chart but unclear how involved they are.  Emergency guardianship was obtained at the last hospitalization.  Functional and Nutritional State:  Per skilled nursing facility nurse practitioner, patient appears to be quite dependent for care and has made little progress.  Palliative Symptoms:  Intermittent agitation, calm currently  Advance Directives/Goals of Care/Anticipatory care planning Discussion:  A detailed discussion regarding GOC, advanced directives, and anticipatory care planning was had.  I spoke at length with patient's legal guardian regarding our concerns for further decompensation and patient's overall clinical condition.  Engaged in discussion of advance directives including the limitations and potential burdens of CPR and intubation, particularly in the context of those living with serious underlying  health conditions.  I also spoke with attending physician who agrees that patient should be DNR/DNI.  Both myself, attending physician, and patient's nurse practitioner from skilled nursing facility feel that patient would benefit from a shift in focus to comfort/quality of life given poor prognosis.  Shared this with patient's legal guardian.  She states that unfortunately while patients are on emergency guardianship it is very difficult to transition to DNR/DNI.  She agrees to follow-up with her supervisor for further recommendations.  In the interim, patient to continue full code/full scope treatment.  Questions and concerns were addressed.  The legal guardian was encouraged to call with questions or concerns.  PMT will continue to support holistically.  Primary Decision maker and health care surrogate:  LEGAL GUARDIAN  Code Status:  Full code    SUMMARY OF RECOMMENDATIONS    Full code/full scope for now Symptoms appear stable on current regimen, no changes recommended at present, use caution with QTc prolonging medication Continue to collaborate with legal guardian to determine appropriate care measures Palliative medicine team will continue to follow for ongoing goals of care discussion, symptom management, and coordination of care.  Code Status/Advance Care Planning: Full code   Symptom Management:  Symptoms stable at present, therefore continue symptom regimen per admitting team with PMT available as needed for support  Prognosis:  Guarded, poor  Discharge Planning: To Be Determined      Primary Diagnoses: Present on Admission:  Acute kidney injury  Hepatic encephalopathy (HCC)  Hypokalemia  Thrombocytopenia  DIASTOLIC HEART FAILURE, CHRONIC  Atrial fibrillation, chronic (HCC)  Hypertension, essential  Cirrhosis (HCC)    Physical Exam Constitutional:  Appearance: He is obese.     Comments: Chronically ill-appearing  Pulmonary:     Effort: Pulmonary  effort is normal. No respiratory distress.  Skin:    General: Skin is warm and dry.  Neurological:     Mental Status: He is lethargic.     Comments: Patient does not respond to verbal stimuli.  May open eyes and look at examiner to physical stimuli but does not answer questions, does not follow commands, and immediately falls back to sleep.     Vital Signs: BP (!) 97/52   Pulse (!) 53   Temp 97.8 F (36.6 C)   Resp 18   Ht 6' (1.829 m)   Wt (!) 161.3 kg   SpO2 100%   BMI 48.23 kg/m  Pain Scale: Faces   Pain Score: Asleep   SpO2: SpO2: 100 % O2 Device:SpO2: 100 % O2 Flow Rate: .O2 Flow Rate (L/min): 0 L/min   Palliative Assessment/Data: Currently: 30%     Billing based on MDM: High  Problems Addressed: One acute or chronic illness or injury that poses a threat to life or bodily function  Amount and/or Complexity of Data: Category 1:Assessment requiring an independent historian(s), Category 2:Independent interpretation of a test performed by another physician/other qualified health care professional (not separately reported), and Category 3:Discussion of management or test interpretation with external physician/other qualified health care professional/appropriate source (not separately reported)  Risks: n/a   [name] CHRISTELLA Pinal, NP  Palliative Medicine Team Team phone # [phone]  Thank you for allowing the Palliative Medicine Team to assist in the care of this patient. Please utilize secure chat with additional questions, if there is no response within 30 minutes please call the above phone number.  Palliative Medicine Team providers are available by phone from 7am to 7pm daily and can be reached through the team cell phone.  Should this patient require assistance outside of these hours, please call the patient's attending physician.

## 2023-10-11 NOTE — Progress Notes (Signed)
 Picc line is flushing and has blood return after changing the dressing and straightening the line.

## 2023-10-12 DIAGNOSIS — N179 Acute kidney failure, unspecified: Secondary | ICD-10-CM | POA: Diagnosis not present

## 2023-10-12 LAB — RENAL FUNCTION PANEL
Albumin: 2.6 g/dL — ABNORMAL LOW (ref 3.5–5.0)
Anion gap: 14 (ref 5–15)
BUN: 33 mg/dL — ABNORMAL HIGH (ref 6–20)
CO2: 18 mmol/L — ABNORMAL LOW (ref 22–32)
Calcium: 8.7 mg/dL — ABNORMAL LOW (ref 8.9–10.3)
Chloride: 102 mmol/L (ref 98–111)
Creatinine, Ser: 2.13 mg/dL — ABNORMAL HIGH (ref 0.61–1.24)
GFR, Estimated: 35 mL/min — ABNORMAL LOW (ref >60)
Glucose, Bld: 99 mg/dL (ref 70–99)
Phosphorus: 2.5 mg/dL (ref 2.5–4.6)
Potassium: 3.2 mmol/L — ABNORMAL LOW (ref 3.5–5.1)
Sodium: 134 mmol/L — ABNORMAL LOW (ref 135–145)

## 2023-10-12 LAB — CBC
HCT: 38.4 % — ABNORMAL LOW (ref 39.0–52.0)
Hemoglobin: 13.1 g/dL (ref 13.0–17.0)
MCH: 31.1 pg (ref 26.0–34.0)
MCHC: 34.1 g/dL (ref 30.0–36.0)
MCV: 91.2 fL (ref 80.0–100.0)
Platelets: 36 K/uL — ABNORMAL LOW (ref 150–400)
RBC: 4.21 MIL/uL — ABNORMAL LOW (ref 4.22–5.81)
RDW: 19 % — ABNORMAL HIGH (ref 11.5–15.5)
WBC: 6.2 K/uL (ref 4.0–10.5)
nRBC: 0 % (ref 0.0–0.2)

## 2023-10-12 MED ORDER — SODIUM CHLORIDE 0.9% FLUSH: 10.0000 mL | INTRAVENOUS | Status: DC | PRN

## 2023-10-12 MED ORDER — LACTATED RINGERS IV SOLN: INTRAVENOUS | Status: AC

## 2023-10-12 MED ORDER — SODIUM CHLORIDE 0.9% FLUSH
10.0000 mL | Freq: Two times a day (BID) | INTRAVENOUS | Status: DC
  Administered 2023-10-12 – 2023-10-14 (×5): 10 mL
  Administered 2023-10-15: 20 mL
  Administered 2023-10-15 – 2023-10-16 (×2): 10 mL

## 2023-10-12 MED ORDER — LACTULOSE ENEMA
300.0000 mL | Freq: Once | ORAL | Status: AC
  Administered 2023-10-13: 300 mL via RECTAL
  Filled 2023-10-12: qty 300

## 2023-10-12 MED ORDER — PANTOPRAZOLE SODIUM 40 MG IV SOLR
40.0000 mg | Freq: Two times a day (BID) | INTRAVENOUS | Status: DC
  Administered 2023-10-12 – 2023-10-15 (×6): 40 mg via INTRAVENOUS
  Filled 2023-10-12 (×6): qty 10

## 2023-10-12 MED ORDER — POTASSIUM CHLORIDE 10 MEQ/100ML IV SOLN
10.0000 meq | INTRAVENOUS | Status: AC
  Administered 2023-10-12 (×4): 10 meq via INTRAVENOUS
  Filled 2023-10-12 (×2): qty 100

## 2023-10-12 NOTE — Progress Notes (Signed)
 Peripherally Inserted Central Catheter Placement  The IV Nurse has discussed with the patient and/or persons authorized to consent for the patient, the purpose of this procedure and the potential benefits and risks involved with this procedure.  The benefits include less needle sticks, lab draws from the catheter, and the patient may be discharged home with the catheter. Risks include, but not limited to, infection, bleeding, blood clot (thrombus formation), and puncture of an artery; nerve damage and irregular heartbeat and possibility to perform a PICC exchange if needed/ordered by physician.  Alternatives to this procedure were also discussed.  Bard Power PICC patient education guide, fact sheet on infection prevention and patient information card has been provided to patient /or left at bedside. Consent obtained from legal guardian.   PICC Placement Documentation  PICC Single Lumen 10/10/23 Right (Active)  Indication for Insertion or Continuance of Line Prolonged intravenous therapies 10/11/23 2000  Exposed Catheter (cm) 6 cm 10/11/23 1654  Site Assessment Clean, Dry, Intact 10/11/23 2000  Line Status Flushed;Infusing;Blood return noted 10/11/23 2000  Dressing Type Transparent;Securing device 10/11/23 2000  Dressing Status Antimicrobial disc/dressing in place 10/11/23 2000  Line Care Connections checked and tightened 10/11/23 2000  Line Adjustment (NICU/IV Team Only) No 10/11/23 1654  Dressing Intervention Other (Comment) 10/11/23 2000  Dressing Change Due 10/18/23 10/11/23 2000     PICC Single Lumen 10/10/23 Right Basilic (Active)     PICC Single Lumen 10/12/23 Left Brachial 46 cm 0 cm (Active)  Indication for Insertion or Continuance of Line Prolonged intravenous therapies 10/12/23 1352  Exposed Catheter (cm) 0 cm 10/12/23 1352  Site Assessment Clean, Dry, Intact 10/12/23 1352  Line Status Flushed;Saline locked;Blood return noted 10/12/23 1352  Dressing Type Transparent;Securing device  10/12/23 1352  Dressing Status Antimicrobial disc/dressing in place;Clean, Dry, Intact 10/12/23 1352  Line Care Connections checked and tightened 10/12/23 1352  Line Adjustment (NICU/IV Team Only) No 10/12/23 1352  Dressing Intervention New dressing;Adhesive placed at insertion site (IV team only);Adhesive placed around edges of dressing (IV team/ICU RN only) 10/12/23 1352  Dressing Change Due 10/19/23 10/12/23 1352       [name] 10/12/2023, 1:53 PM

## 2023-10-12 NOTE — Progress Notes (Signed)
 PROGRESS NOTE  [name], is a 58 y.o. male, DOB - [birth year], FMW:[id number]  Admit date - 10/10/2023   Admitting Physician Posey Maier, DO  Outpatient Primary MD for the patient is Carleen Dallas PARAS, FNP (Inactive)  LOS - 1  Chief Complaint  Patient presents with   Abnormal labs      Brief Summary:- 58 y.o. male with medical history significant of CHF, atrial fibrillation, diastolic CHF, asthma,  liver cirrhosis with esophageal varices and history of hepatic encephalopathy admitted on 10/11/2023 with AKI in the setting of dehydration as well hepatic encephalopathy with poor oral intake     A/p  1)Acute kidney injury due to dehydration compounded by diuretic use Creatinine 1.95 > 2.02 >>2.13 (baseline creatinine at 0.8-1.1) Continue gentle hydration - Hold Aldactone  and Torsemide  Renally adjust medications, avoid nephrotoxic agents / dehydration /hypotension   2)Prolonged QT interval QTc 531 ms Avoid QT prolonging drugs -Correct electrolytes abnormalities   3)Hepatic encephalopathy -patient remains quite sleepy -Serum ammonia on admission was 47 -Having liquid BMs with lactulose  -Continue to hold buspirone , gabapentin , doxepin  --Recheck serum ammonia in a.m. due to persistent lethargy   4)Hypokalemia--- due to lactulose  use with diarrhea as well as Lasix  use -Phosphorus and magnesium  WNL -Replace potassium   5)Presumed acute endocarditis/MSSA Bacteremia Patient was noted to be positive for MSSA bacteremia during last admission, unfortunately TEE cannot be done due to history of esophageal varices and thrombocytopenia, so he was started on presumptive endocarditis with placement of PICC line with need for 6-week course of IV antibiotics per ID recommendations. -Continue iv Cefazolin  for MSSA bacteremia with EOT 11/12/23.  Follow-up with video visit with ID on 10/31/2023 at 9:30 AM   6)Thrombocytopenia (chronic) due to underlying liver disease Platelets 56 >>36---  continue to monitor platelets with morning labs   7)Bil Feet wounds--wound care consult appreciated -Follow recommendations of wound care specialist   8)Chronic diastolic CHF Echocardiogram done on 10/02/2023 showed LVEF of 70 to 75%.  No WMA.  Moderate asymmetric LVH.  Indeterminate LV diastolic parameters. --Currently dehydrated okay to give IV fluids judiciously - Holding Lasix  and Aldactone  due to dehydration and AKI   9)PAFib- EKG showed normal sinus rhythm Patient is not on anticoagulation due to high risk for GI bleed from esophageal varices and chronic thrombocytopenia Home Coreg  and Cardizem  will be held at this time due to soft BP  10)Acquired hypothyroidism Continue Synthroid    11)GERD Continue Protonix    12)Liver Cirrhosis Patient with history of alcoholic liver cirrhosis with history of splenic, mesenteric and portal vein thrombosis, thrombocytopenia, portal hypertension with esophageal varices, hepatic encephalopathy Continue lactulose - repeat serum ammonia as above -consider adding beta-blocker when able when BP allows   13)Morbid Obesity- -Low calorie diet, portion control and increase physical activity discussed with patient -Body mass index is 48.23 kg/m. - Complicates overall care   14)Social/ethics--patient is from [facility] facility - He has a state appointed legal guardian - Palliative care consult appreciated - Overall prognosis is poor - Patient remains a full code for now pending CODE STATUS decisions by legal guardian   Status is: Inpatient   Disposition: The patient is from: SNF [facility]              Anticipated d/c is to: SNF              Anticipated d/c date is: > 3 days              Patient currently is not  medically stable to d/c. Barriers: Not Clinically Stable-   Code Status :  -  Code Status: Full Code   Family Communication:    He has a state appointed legal guardian--notified of admission  DVT Prophylaxis  :   - SCDs   SCDs Start: 10/11/23 0209   Lab Results  Component Value Date   PLT 36 (L) 10/12/2023    Inpatient Medications  Scheduled Meds:  (feeding supplement) PROSource Plus  30 mL Oral TID BM   ascorbic acid  500 mg Oral BID   Chlorhexidine  Gluconate Cloth  6 each Topical Daily   feeding supplement  237 mL Oral BID BM   Gerhardt's butt cream   Topical BID   lactulose   30 g Oral TID   leptospermum manuka honey  1 Application Topical Daily   levothyroxine   125 mcg Oral Q0600   midodrine  5 mg Oral BID WC   multivitamin with minerals  1 tablet Oral Daily   pantoprazole   40 mg Oral BID   sodium chloride  flush  10-40 mL Intracatheter Q12H   sodium chloride  flush  10-40 mL Intracatheter Q12H   zinc sulfate (50mg  elemental zinc)  220 mg Oral Daily   Continuous Infusions:   ceFAZolin  (ANCEF ) IV 2 g (10/12/23 1423)   potassium chloride      PRN Meds:.prochlorperazine, sodium chloride  flush, sodium chloride  flush   Anti-infectives (From admission, onward)    Start     Dose/Rate Route Frequency Ordered Stop   10/11/23 0600  ceFAZolin  (ANCEF ) IVPB  Status:  Discontinued       Note to Pharmacy: Indication:  MSSA bacteremia First Dose: Yes Last Day of Therapy:  11/12/23 Labs - Once weekly:  CBC/D and CMP Pull PICC line at the completion of IV antibiotic therapy Method of administration: IV Push or per SNF protocol Method of administration may be changed at the discretion of home infusi   2 g Intravenous Every 8 hours 10/11/23 0226 10/11/23 0228   10/11/23 0600  ceFAZolin  (ANCEF ) IVPB 2g/100 mL premix        2 g 200 mL/hr over 30 Minutes Intravenous Every 8 hours 10/11/23 0230 11/12/23 2359       Subjective: Ziggy Holzmann today has no fevers, no emesis,  No chest pain,    - Sleepy on and off - Having liquid stools - Refusing oral intake   Objective: Vitals:   10/11/23 2214 10/11/23 2314 10/12/23 0530 10/12/23 1401  BP: 115/77  117/76 125/82  Pulse: (!) 53 67 (!) 59 (!) 58   Resp: 12  14 17   Temp: (!) 97.5 F (36.4 C) 98 F (36.7 C) 97.9 F (36.6 C) 98 F (36.7 C)  TempSrc: Oral Oral Axillary Axillary  SpO2: 98%  98% 99%  Weight:      Height:        Intake/Output Summary (Last 24 hours) at 10/12/2023 1510 Last data filed at 10/12/2023 0303 Gross per 24 hour  Intake 1032.72 ml  Output --  Net 1032.72 ml   Filed Weights   10/10/23 1330 10/11/23 0157  Weight: (!) 168.7 kg (!) 161.3 kg    Physical Exam  Gen:-Sleepy on and off , chronically ill-appearing HEENT:- [place].AT,  +ve sclera icterus Neck-Supple Neck,No JVD,.  Lungs-  CTAB , fair symmetrical air movement CV- S1, S2 normal, regular  Abd-  +ve B.Sounds, Abd Soft, No tenderness, increased truncal adiposity Extremity/Skin:-Chronic venous stasis, pedal pulses present  Psych-affect is flat,, disoriented from time to time  Neuro-Sleepy on and off, no new focal deficits, no tremors MSK-right foot wound with dressings - Left arm PICC line site  Data Reviewed: I have personally reviewed following labs and imaging studies  CBC: Recent Labs  Lab 10/10/23 1543 10/11/23 0518 10/12/23 0450  WBC 7.8 4.8 6.2  NEUTROABS 6.4  --   --   HGB 14.1 12.3* 13.1  HCT 41.4 36.3* 38.4*  MCV 90.6 90.8 91.2  PLT 56* 36* 36*   Basic Metabolic Panel: Recent Labs  Lab 10/10/23 1543 10/10/23 2117 10/11/23 0518 10/12/23 0450  NA 131* 132* 134* 134*  K 3.3* 3.4* 3.8 3.2*  CL 98 100 101 102  CO2 19* 19* 20* 18*  GLUCOSE 146* 144* 122* 99  BUN 28* 29* 31* 33*  CREATININE 1.95* 2.02* 2.00* 2.13*  CALCIUM 9.1 8.7* 8.8* 8.7*  MG  --   --  2.1  --   PHOS  --   --  2.9 2.5   GFR: Estimated Creatinine Clearance: 58 mL/min (A) (by C-G formula based on SCr of 2.13 mg/dL (H)). Liver Function Tests: Recent Labs  Lab 10/11/23 0518 10/12/23 0450  AST 58*  --   ALT 11  --   ALKPHOS 133*  --   BILITOT 3.9*  --   PROT 6.1*  --   ALBUMIN  2.7* 2.6*   Radiology Studies:  Scheduled Meds:  (feeding  supplement) PROSource Plus  30 mL Oral TID BM   ascorbic acid  500 mg Oral BID   Chlorhexidine  Gluconate Cloth  6 each Topical Daily   feeding supplement  237 mL Oral BID BM   Gerhardt's butt cream   Topical BID   lactulose   30 g Oral TID   leptospermum manuka honey  1 Application Topical Daily   levothyroxine   125 mcg Oral Q0600   midodrine  5 mg Oral BID WC   multivitamin with minerals  1 tablet Oral Daily   pantoprazole   40 mg Oral BID   sodium chloride  flush  10-40 mL Intracatheter Q12H   sodium chloride  flush  10-40 mL Intracatheter Q12H   zinc sulfate (50mg  elemental zinc)  220 mg Oral Daily   Continuous Infusions:   ceFAZolin  (ANCEF ) IV 2 g (10/12/23 1423)   potassium chloride       LOS: 1 day   Rendall Carwin M.D on 10/12/2023 at 3:10 PM  Go to [email or web address] - for contact info  [facility] - Office  [phone]  If 7PM-7AM, please contact night-coverage [email or web address] 10/12/2023, 3:10 PM

## 2023-10-12 NOTE — Plan of Care (Signed)

## 2023-10-13 DIAGNOSIS — K7682 Hepatic encephalopathy: Secondary | ICD-10-CM | POA: Diagnosis not present

## 2023-10-13 DIAGNOSIS — N179 Acute kidney failure, unspecified: Secondary | ICD-10-CM | POA: Diagnosis not present

## 2023-10-13 DIAGNOSIS — I330 Acute and subacute infective endocarditis: Secondary | ICD-10-CM

## 2023-10-13 DIAGNOSIS — E7220 Disorder of urea cycle metabolism, unspecified: Secondary | ICD-10-CM | POA: Diagnosis not present

## 2023-10-13 LAB — RENAL FUNCTION PANEL
Albumin: 2.7 g/dL — ABNORMAL LOW (ref 3.5–5.0)
Anion gap: 15 (ref 5–15)
BUN: 33 mg/dL — ABNORMAL HIGH (ref 6–20)
CO2: 18 mmol/L — ABNORMAL LOW (ref 22–32)
Calcium: 9 mg/dL (ref 8.9–10.3)
Chloride: 103 mmol/L (ref 98–111)
Creatinine, Ser: 1.84 mg/dL — ABNORMAL HIGH (ref 0.61–1.24)
GFR, Estimated: 41 mL/min — ABNORMAL LOW (ref >60)
Glucose, Bld: 108 mg/dL — ABNORMAL HIGH (ref 70–99)
Phosphorus: 2.3 mg/dL — ABNORMAL LOW (ref 2.5–4.6)
Potassium: 3.2 mmol/L — ABNORMAL LOW (ref 3.5–5.1)
Sodium: 136 mmol/L (ref 135–145)

## 2023-10-13 LAB — CBC
HCT: 37.1 % — ABNORMAL LOW (ref 39.0–52.0)
Hemoglobin: 12.9 g/dL — ABNORMAL LOW (ref 13.0–17.0)
MCH: 31.4 pg (ref 26.0–34.0)
MCHC: 34.8 g/dL (ref 30.0–36.0)
MCV: 90.3 fL (ref 80.0–100.0)
Platelets: 43 K/uL — ABNORMAL LOW (ref 150–400)
RBC: 4.11 MIL/uL — ABNORMAL LOW (ref 4.22–5.81)
RDW: 19.1 % — ABNORMAL HIGH (ref 11.5–15.5)
WBC: 7.2 K/uL (ref 4.0–10.5)
nRBC: 0 % (ref 0.0–0.2)

## 2023-10-13 LAB — AMMONIA: Ammonia: 138 umol/L — ABNORMAL HIGH (ref 9–35)

## 2023-10-13 LAB — MAGNESIUM: Magnesium: 2 mg/dL (ref 1.7–2.4)

## 2023-10-13 MED ORDER — OXYCODONE HCL 5 MG PO TABS
5.0000 mg | ORAL_TABLET | Freq: Four times a day (QID) | ORAL | Status: DC | PRN
  Administered 2023-10-14 – 2023-10-16 (×3): 5 mg via ORAL
  Filled 2023-10-13 (×4): qty 1

## 2023-10-13 NOTE — Progress Notes (Signed)
 Progress Note   Patient: [name] FMW:[id number] DOB: 20-Jul-1965 DOA: 10/10/2023     2 DOS: the patient was seen and examined on 10/13/2023   Brief hospital course: Norma Fromm is a 58 y.o. male with medical history significant of CHF, atrial fibrillation, diastolic CHF, asthma,  liver cirrhosis with esophageal varices and history of hepatic encephalopathy admitted on 10/11/2023 with AKI in the setting of dehydration as well hepatic encephalopathy with poor oral intake    Assessment and Plan: Acute kidney injury due to dehydration compounded by diuretic use- Creatinine 1.95 > 2.02 >>2.13>>1.84. (baseline creatinine at 0.8-1.1) Encourage oral diet if he is more alert and awake Hold Aldactone  and Torsemide  Renally adjust medications, avoid nephrotoxic agents/ dehydration/ Hypotension.   Prolonged QT interval- QTc 531 ms Avoid QT prolonging drugs Continue to monitor electrolytes and replace accordingly   Hepatic encephalopathy - More sleepy and lethargic. Serum ammonia on admission was 47. Having liquid BMs, continue lactulose . Continue to hold buspirone , gabapentin , doxepin  until his mental status improves. Monitor serum ammonia in a.m. due to persistent lethargy.   Hypokalemia--- due to lactulose  use with diarrhea as well as Lasix  use Phosphorus and magnesium  WNL Replace potassium and recheck   Presumed acute endocarditis/ MSSA Bacteremia Patient was noted to be positive for MSSA bacteremia during last admission, unfortunately TEE cannot be done due to history of esophageal varices and thrombocytopenia, so he was started on presumptive endocarditis with placement of PICC line with need for 6-week course of IV antibiotics per ID recommendations. Continue iv Cefazolin  for MSSA bacteremia with EOT 11/12/23.  Follow-up with video visit with ID on 10/31/2023 at 9:30 AM.   Thrombocytopenia (chronic) due to underlying liver disease Platelets 56 >>36>>43-- continue to monitor  platelets with daily cbc.   Bilateral feet wounds--wound care consult appreciated. Follow recommendations of wound care and dressing changes.   Chronic diastolic CHF Echocardiogram done on 10/02/2023 showed LVEF of 70 to 75%.  No WMA.  Moderate asymmetric LVH.  Indeterminate LV diastolic parameters. Caution with fluid overload.  Encourage oral diet, fluids Continue to hold Lasix  and Aldactone  due to dehydration and AKI   Paroxysmal atrial fibrillation EKG showed normal sinus rhythm Patient is not on anticoagulation due to high risk for GI bleed from esophageal varices and chronic thrombocytopenia Continue to hold Home Coreg  and Cardizem  due to soft BP.   Acquired hypothyroidism Continue Synthroid    GERD Continue Protonix    Alcoholic liver Cirrhosis History of splenic, mesenteric and portal vein thrombosis, thrombocytopenia, portal hypertension with esophageal varices, hepatic encephalopathy Continue lactulose - repeat serum ammonia as above Will consider adding beta-blocker when able when BP allows   Morbid Obesity- BMI 48.23. Counsel low calorie diet, portion control and increase physical activity discussed with patient   Social/ethics--patient is from [facility] facility. He has a state appointed legal guardian. Palliative care consult appreciated. Overall prognosis is poor. Patient remains a full code for now pending CODE STATUS decisions by legal guardian.     Out of bed to chair. Incentive spirometry. Nursing supportive care. Fall, aspiration precautions. Diet:  Diet Orders (From admission, onward)     Start     Ordered   10/13/23 0916  Diet NPO time specified Except for: Sips with Meds  Diet effective now       Question:  Except for  Answer:  Noralyn with Meds   10/13/23 0915           DVT prophylaxis: SCDs Start: 10/11/23 0209  Level  of care: Med-Surg   Code Status: Full Code  Subjective: Patient is seen and examined today morning.  He is sleepy  and lethargic, able to open eyes but does not respond to questions.  RN at bedside able to give oral meds.  He is not eating well.  Physical Exam: Vitals:   10/12/23 1401 10/12/23 1942 10/13/23 0530 10/13/23 1207  BP: 125/82 (!) 124/99 104/60 (!) 140/106  Pulse: (!) 58 69 66 80  Resp: 17 14 14 20   Temp: 98 F (36.7 C) (!) 97.3 F (36.3 C) 97.6 F (36.4 C) 98.3 F (36.8 C)  TempSrc: Axillary  Axillary Oral  SpO2: 99% 97%  100%  Weight:      Height:        General - Middle aged obese Caucasian male, sleeping, no distress HEENT - PERRLA, EOMI, atraumatic head, non tender sinuses. Lung -distant breath sounds, basal rales, rhonchi, no wheezes. Heart - S1, S2 heard, no murmurs, rubs, trace pitting pedal edema. Abdomen - Soft, non tender, obese, bowel sounds good Neuro -pain lethargic, unable to do full neuroexam. Skin - Warm and dry.  Data Reviewed:      Latest Ref Rng & Units 10/13/2023    4:58 AM 10/12/2023    4:50 AM 10/11/2023    5:18 AM  CBC  WBC 4.0 - 10.5 K/uL 7.2  6.2  4.8   Hemoglobin 13.0 - 17.0 g/dL 87.0  86.8  87.6   Hematocrit 39.0 - 52.0 % 37.1  38.4  36.3   Platelets 150 - 400 K/uL 43  36  36       Latest Ref Rng & Units 10/13/2023    4:58 AM 10/12/2023    4:50 AM 10/11/2023    5:18 AM  BMP  Glucose 70 - 99 mg/dL 891  99  877   BUN 6 - 20 mg/dL 33  33  31   Creatinine 0.61 - 1.24 mg/dL 8.15  7.86  7.99   Sodium 135 - 145 mmol/L 136  134  134   Potassium 3.5 - 5.1 mmol/L 3.2  3.2  3.8   Chloride 98 - 111 mmol/L 103  102  101   CO2 22 - 32 mmol/L 18  18  20    Calcium 8.9 - 10.3 mg/dL 9.0  8.7  8.8    No results found.  Family Communication: No family at bedside.  Disposition: Status is: Inpatient Remains inpatient appropriate because: Altered mental status, AKI, abnormal electrolytes  Planned Discharge Destination: Skilled nursing facility     Time spent: 45 minutes  Author: Concepcion Riser, MD 10/13/2023 2:56 PM Secure chat 7am to  7pm For on call review [email or web address].

## 2023-10-13 NOTE — Progress Notes (Signed)
 1000 mL liquid stool collected through rectal tube after lactulose  enema.

## 2023-10-14 DIAGNOSIS — E7220 Disorder of urea cycle metabolism, unspecified: Secondary | ICD-10-CM | POA: Diagnosis not present

## 2023-10-14 DIAGNOSIS — K7682 Hepatic encephalopathy: Secondary | ICD-10-CM | POA: Diagnosis not present

## 2023-10-14 DIAGNOSIS — I330 Acute and subacute infective endocarditis: Secondary | ICD-10-CM | POA: Diagnosis not present

## 2023-10-14 DIAGNOSIS — Z515 Encounter for palliative care: Secondary | ICD-10-CM | POA: Diagnosis not present

## 2023-10-14 DIAGNOSIS — N179 Acute kidney failure, unspecified: Secondary | ICD-10-CM | POA: Diagnosis not present

## 2023-10-14 DIAGNOSIS — Z7189 Other specified counseling: Secondary | ICD-10-CM | POA: Diagnosis not present

## 2023-10-14 LAB — BASIC METABOLIC PANEL WITH GFR
Anion gap: 16 — ABNORMAL HIGH (ref 5–15)
BUN: 31 mg/dL — ABNORMAL HIGH (ref 6–20)
CO2: 17 mmol/L — ABNORMAL LOW (ref 22–32)
Calcium: 9 mg/dL (ref 8.9–10.3)
Chloride: 104 mmol/L (ref 98–111)
Creatinine, Ser: 1.54 mg/dL — ABNORMAL HIGH (ref 0.61–1.24)
GFR, Estimated: 51 mL/min — ABNORMAL LOW (ref >60)
Glucose, Bld: 122 mg/dL — ABNORMAL HIGH (ref 70–99)
Potassium: 2.8 mmol/L — ABNORMAL LOW (ref 3.5–5.1)
Sodium: 137 mmol/L (ref 135–145)

## 2023-10-14 MED ORDER — METOPROLOL SUCCINATE ER 50 MG PO TB24
50.0000 mg | ORAL_TABLET | Freq: Every day | ORAL | Status: DC
  Administered 2023-10-14 – 2023-10-16 (×3): 50 mg via ORAL
  Filled 2023-10-14 (×3): qty 1

## 2023-10-14 MED ORDER — POTASSIUM CHLORIDE CRYS ER 20 MEQ PO TBCR
40.0000 meq | EXTENDED_RELEASE_TABLET | Freq: Two times a day (BID) | ORAL | Status: DC
  Administered 2023-10-14 – 2023-10-16 (×5): 40 meq via ORAL
  Filled 2023-10-14 (×5): qty 2

## 2023-10-14 MED ORDER — POTASSIUM CHLORIDE 10 MEQ/100ML IV SOLN
10.0000 meq | INTRAVENOUS | Status: AC
  Administered 2023-10-14 (×2): 10 meq via INTRAVENOUS
  Filled 2023-10-14 (×2): qty 100

## 2023-10-14 NOTE — Progress Notes (Signed)
 Daily Progress Note   Patient Name: [name]       Date: 10/14/2023 DOB: [birth year]  Age: 58 y.o. MRN#: [id number] Attending Physician: Darci Pore, MD Primary Care Physician: Carleen Dallas PARAS, FNP (Inactive) Admit Date: 10/10/2023  Reason for Consultation/Follow-up: Establishing goals of care  Subjective:  58 y.o. male  with past medical history of CHF, atrial fibrillation, diastolic CHF, asthma,  liver cirrhosis with esophageal varices and history of hepatic encephalopathy admitted on 10/10/2023 with hypokalemia.   Workup revealed AKI for which she is receiving IV fluids.  Hypokalemia for which replacement has been ordered.  Also noted to have altered mental status felt to be possibly related to hepatic encephalopathy in the setting of hyperammonemia for which he is being treated with lactulose .  Has right foot wound.   Patient was recent hospitalized from 09/25/2023 through 10/05/2023 for acute metabolic encephalopathy, myxedema, and Warnicke's encephalopathy.  Also noted to have MSSA bacteremia.  On 6 weeks of IV antibiotics for endocarditis as unable to complete TEE due to presence of esophageal varices.  During this hospitalization, emergency guardianship had to be obtained due to patient's lack of decision making capacity and no established healthcare surrogate.  He was ultimately discharged to SNF.  10/11/2023: Collaborated with outpatient provider, attending, and legal guardian.  Discussed poor prognosis and that multiple providers including this nurse practitioner, attending physician, and patient's outpatient nurse practitioner recommend transition to DNR/DNI and consider hospice.  Legal guardian states he is under emergency guardianship and she will need to speak with supervisor to determine if that is possible.  Plan  to continue full code/full scope in the interim.  Today, interval events over the weekend reviewed.  PICC line was placed for long-term antibiotic use. Continued to have liquid stool via rectal tube after lactulose .  Continue to be sleepy and lethargic.  Labs independently reviewed.  Progressive hypokalemia noted in the setting of lactulose  use for hyperammonemia (today: 2.8<<3.2<<3.2<<3.8<<3.4<<3.3- 10/10/2023).  Recommend repletion.  CO2 level progressively lower with level of 17 today compared to 18 yesterday.  Would recommend trending.  Glucose minimally elevated questionable significance as unsure relationship to fasting.  Renal function gradually improving (creatinine today: 1.54 <<1.84<<2.13<<2<<2.02<<1.95-10/10/2023).  Continue IV hydration CBC reviewed.  White count normal.  Hemoglobin remains in 12 to 13 g/dL range.  Recommend continue to trend.  Medication administration record reviewed.  Continues on scheduled lactulose  for hepatic encephalopathy.  No as needed symptom meds given on 24-hour look back.  Vital signs reviewed.  Intermittent tachycardia and hypertension noted.  Otherwise, vital signs stable including O2 sats greater than 90 on room  air and afebrile.  Independent history obtained from nursing staff as patient has cognitive impairment that precludes him from providing an accurate history.  Per nursing: No significant concerns. Occasionally agitated but able to reorient. Has been tolerating meds and fluids PO. Uncertain about amount eaten at meals.   Spoke with patient's legal guardian.  We discussed his poor prognosis and the limitations and potential burdens of CPR/intubation in the context of someone with such poor prognosis.  Stated that if able we can get to provider sign off to transition to DNR/DNI.  Legal guardian states that she followed up with her supervisor however, because patient is under emergency guardianship they are unable to make any changes to CODE STATUS.  She states  that they will work on obtaining permanent guardianship however this can take weeks or longer.  Continue current plan and full code/full scope treatment.  Today, patient continues to be confused.  He is able to report a little bit of back pain and states that he does not feel well but is unable to further describe.  He is confused and does not answer questions appropriately.  He is able to state that he is in the hospital but when asked what year he asked for someone to bring an Technical sales engineer.  Becomes somewhat agitated with questioning.  Chart review/care coordination:  Completed extensive chart review including EPIC notes, labs (independently reviewed) medication administration record, and vital signs. Coordinated care directly with attending physician, bedside nursing staff, TOC, and legal guardian.    Length of Stay: 3   Physical Exam Constitutional:      General: He is not in acute distress.    Appearance: He is obese.     Comments: Chronically ill-appearing  Pulmonary:     Effort: Pulmonary effort is normal. No respiratory distress.  Skin:    General: Skin is warm and dry.     Findings: Bruising present.             Vital Signs: BP (!) 156/109 (BP Location: Right Arm)   Pulse 91   Temp (!) 97.5 F (36.4 C) (Oral)   Resp 20   Ht 6' (1.829 m)   Wt (!) 161.3 kg   SpO2 100%   BMI 48.23 kg/m  SpO2: SpO2: 100 % O2 Device: O2 Device: Room Air O2 Flow Rate: O2 Flow Rate (L/min): 0 L/min      Palliative Assessment/Data: 30 to 40%   Palliative Care Assessment & Plan   Patient Profile/Assessment:  58 year old male admitted 10/10/2023 for hypokalemia and hepatic encephalopathy.  Patient with recurrent and recent hospitalizations.  Initial completed 10/11/2023.  This patient is well-known to me from prior admission as well.  He required establishment of emergency legal guardian at last hospital admission due to lack of capacity and no established health surrogate.  He continues to  decline and has poor prognosis.  On 10/3 collaborated with patient's outpatient SNF provider, attending physician, and legal guardian.  Shared that given prognosis and limitations and potential burdens of CPR and intubation, DNR/DNI recommended.  Unfortunately, he is under emergency legal guardianship.  Therefore per his guardian some additional steps may be required to make updates to CODE STATUS.  Plan was to continue full code/full scope until legal guardian is able to follow-up with her supervisor to determine next steps.  Follow-up on 10/14/2023.  Despite patient's continued poor prognosis, legal guardian states that they are not allowed to change CODE STATUS under any circumstances while on emergency guardianship.  They plan to work on establishing permanent guardianship but this can take weeks or longer.  Therefore, plan is to proceed with full code/full scope. Attending physician made aware.  Legal guardian to continue to pursue permanent guardianship.  Would benefit from outpatient palliative follow-up.  Recommendations/Plan:  Full code/full scope for now Symptoms appear stable on current regimen, no changes recommended at present, use caution with QTc prolonging medication Continue to collaborate with legal guardian to determine appropriate care measures Palliative medicine team will continue to follow for ongoing goals of care discussion, symptom management, and coordination of care.  Symptom management:  Symptoms stable at present, therefore continue symptom regimen per admitting team with PMT available as needed for support   Prognosis: Guarded/poor    Discharge Planning: To be determined, likely back to SNF for long-term care    Detailed review of medical records (labs, imaging, vital signs), medically appropriate exam, discussed with treatment team, counseling and education to patient, family, & staff, documenting clinical information, medication management, coordination of  care    Billing based on MDM: High  Problems Addressed: One acute or chronic illness or injury that poses a threat to life or bodily function  Amount and/or Complexity of Data: Category 1:Assessment requiring an independent historian(s) and Category 3:Discussion of management or test interpretation with external physician/other qualified health care professional/appropriate source (not separately reported)  Risks: n/a         Laymon CHRISTELLA Pinal, NP  Palliative Medicine Team Team phone # [phone]  Thank you for allowing the Palliative Medicine Team to assist in the care of this patient. Please utilize secure chat with additional questions, if there is no response within 30 minutes please call the above phone number.  Palliative Medicine Team providers are available by phone from 7am to 7pm daily and can be reached through the team cell phone.  Should this patient require assistance outside of these hours, please call the patient's attending physician.

## 2023-10-14 NOTE — Progress Notes (Signed)
 Progress Note   Patient: [name] FMW:[id number] DOB: 05-15-1965 DOA: 10/10/2023     3 DOS: the patient was seen and examined on 10/14/2023   Brief hospital course: [name] is a 58 y.o. male with medical history significant of CHF, atrial fibrillation, diastolic CHF, asthma,  liver cirrhosis with esophageal varices and history of hepatic encephalopathy admitted on 10/11/2023 with AKI in the setting of dehydration as well hepatic encephalopathy with poor oral intake.   Assessment and Plan: Acute kidney injury due to dehydration compounded by diuretic use- Creatinine 1.95 > 2.02 >>2.13>>1.84>>1.54. (baseline creatinine at 0.8-1.1) Encourage oral diet if he is more alert and awake Hold Aldactone  and Torsemide  Renally adjust medications, avoid nephrotoxic agents/ dehydration/ Hypotension.   Prolonged QT interval- QTc 531 ms.  Repeat EKG ordered. Avoid QT prolonging drugs Continue to monitor electrolytes and replace accordingly   Hepatic encephalopathy - Alert, able to follow simple commands today. Serum ammonia on admission was 47 was not 138.  Repeat ammonia tomorrow Having liquid BMs, continue lactulose . Continue to hold buspirone , gabapentin , doxepin  until his mental status improves. Changed diet to soft as he is more awake.   Hypokalemia--- due to lactulose  use with diarrhea as well as Lasix  use. Potassium 2.8 today.  IV and oral potassium supplementation ordered. Phosphorus and magnesium  WNL Replace potassium and recheck   Presumed acute endocarditis/ MSSA Bacteremia Patient was noted to be positive for MSSA bacteremia during last admission, unfortunately TEE cannot be done due to history of esophageal varices and thrombocytopenia, so he was started on presumptive endocarditis with placement of PICC line with need for 6-week course of IV antibiotics per ID recommendations. Continue iv Cefazolin  for MSSA bacteremia with EOT 11/12/23.  Follow-up with video visit with ID  on 10/31/2023 at 9:30 AM.   Thrombocytopenia (chronic) due to underlying liver disease Platelets 56 >>36>>43-- continue to monitor platelets with daily cbc.   Bilateral feet wounds--wound care consult appreciated. Follow recommendations of wound care and dressing changes.   Chronic diastolic CHF Echocardiogram done on 10/02/2023 showed LVEF of 70 to 75%.  No WMA.  Moderate asymmetric LVH.  Indeterminate LV diastolic parameters. Caution with fluid overload.  Encourage oral diet, fluids Continue to hold Lasix  and Aldactone  due to dehydration and AKI   Paroxysmal atrial fibrillation EKG showed normal sinus rhythm Patient is not on anticoagulation due to high risk for GI bleed from esophageal varices and chronic thrombocytopenia Continue to hold Home Cardizem  due to soft BP. Started metoprolol  xl 50mg  daily.   Acquired hypothyroidism Continue Synthroid    GERD Continue Protonix    Alcoholic liver Cirrhosis History of splenic, mesenteric and portal vein thrombosis, thrombocytopenia, portal hypertension with esophageal varices, hepatic encephalopathy Continue lactulose - repeat serum ammonia as above Started metoprolol  XL 50mg  as BP better.   Morbid Obesity- BMI 48.23. Counsel low calorie diet, portion control and increase physical activity discussed with patient   Social/ethics--patient is from [facility] facility. He has a state appointed legal guardian. Palliative care consult appreciated. Overall prognosis is poor. Palliative discussion as outpatient for goals of care discussion. Patient remains a full code for now pending CODE STATUS decisions by legal guardian.     Out of bed to chair. Incentive spirometry. Nursing supportive care. Fall, aspiration precautions. Diet:  Diet Orders (From admission, onward)     Start     Ordered   10/13/23 0916  Diet NPO time specified Except for: Sips with Meds  Diet effective now  Question:  Except for  Answer:  [name] with Meds    10/13/23 0915           DVT prophylaxis: SCDs Start: 10/11/23 0209  Level of care: Med-Surg   Code Status: Full Code  Subjective: Patient is seen and examined today morning.  He is awake, able to answer me though confused. Wishes to eat soft diet.  Physical Exam: Vitals:   10/13/23 0530 10/13/23 1207 10/13/23 2009 10/14/23 0327  BP: 104/60 (!) 140/106 (!) 142/87 (!) 156/109  Pulse: 66 80 80 91  Resp: 14 20 20 20   Temp: 97.6 F (36.4 C) 98.3 F (36.8 C) (!) 97.5 F (36.4 C)   TempSrc: Axillary Oral Oral   SpO2:  100% 100%   Weight:      Height:        General - Middle aged obese Caucasian male, sleeping, no distress HEENT - PERRLA, EOMI, atraumatic head, non tender sinuses. Lung -distant breath sounds, basal rales, rhonchi, no wheezes. Heart - S1, S2 heard, no murmurs, rubs, trace pitting pedal edema. Abdomen - Soft, non tender, obese, bowel sounds good Neuro -pain lethargic, unable to do full neuroexam. Skin - Warm and dry.  Data Reviewed:      Latest Ref Rng & Units 10/13/2023    4:58 AM 10/12/2023    4:50 AM 10/11/2023    5:18 AM  CBC  WBC 4.0 - 10.5 K/uL 7.2  6.2  4.8   Hemoglobin 13.0 - 17.0 g/dL 87.0  86.8  87.6   Hematocrit 39.0 - 52.0 % 37.1  38.4  36.3   Platelets 150 - 400 K/uL 43  36  36       Latest Ref Rng & Units 10/14/2023    4:36 AM 10/13/2023    4:58 AM 10/12/2023    4:50 AM  BMP  Glucose 70 - 99 mg/dL 877  891  99   BUN 6 - 20 mg/dL 31  33  33   Creatinine 0.61 - 1.24 mg/dL 8.45  8.15  7.86   Sodium 135 - 145 mmol/L 137  136  134   Potassium 3.5 - 5.1 mmol/L 2.8  3.2  3.2   Chloride 98 - 111 mmol/L 104  103  102   CO2 22 - 32 mmol/L 17  18  18    Calcium 8.9 - 10.3 mg/dL 9.0  9.0  8.7    No results found.  Family Communication: No family at bedside.  Disposition: Status is: Inpatient Remains inpatient appropriate because: poor mental status, AKI, abnormal electrolytes  Planned Discharge Destination: Skilled nursing  facility     Time spent: 44 minutes  Author: Concepcion Riser, MD 10/14/2023 1:13 PM Secure chat 7am to 7pm For on call review [email or web address].

## 2023-10-15 LAB — BASIC METABOLIC PANEL WITH GFR
Anion gap: 14 (ref 5–15)
BUN: 29 mg/dL — ABNORMAL HIGH (ref 6–20)
CO2: 17 mmol/L — ABNORMAL LOW (ref 22–32)
Calcium: 9 mg/dL (ref 8.9–10.3)
Chloride: 101 mmol/L (ref 98–111)
Creatinine, Ser: 1.51 mg/dL — ABNORMAL HIGH (ref 0.61–1.24)
GFR, Estimated: 53 mL/min — ABNORMAL LOW (ref >60)
Glucose, Bld: 114 mg/dL — ABNORMAL HIGH (ref 70–99)
Potassium: 3.2 mmol/L — ABNORMAL LOW (ref 3.5–5.1)
Sodium: 133 mmol/L — ABNORMAL LOW (ref 135–145)

## 2023-10-15 LAB — CBC
HCT: 37.8 % — ABNORMAL LOW (ref 39.0–52.0)
Hemoglobin: 12.5 g/dL — ABNORMAL LOW (ref 13.0–17.0)
MCH: 30.6 pg (ref 26.0–34.0)
MCHC: 33.1 g/dL (ref 30.0–36.0)
MCV: 92.6 fL (ref 80.0–100.0)
Platelets: 48 K/uL — ABNORMAL LOW (ref 150–400)
RBC: 4.08 MIL/uL — ABNORMAL LOW (ref 4.22–5.81)
RDW: 19.7 % — ABNORMAL HIGH (ref 11.5–15.5)
WBC: 8.2 K/uL (ref 4.0–10.5)
nRBC: 0 % (ref 0.0–0.2)

## 2023-10-15 LAB — AMMONIA: Ammonia: 38 umol/L — ABNORMAL HIGH (ref 9–35)

## 2023-10-15 MED ORDER — PANTOPRAZOLE SODIUM 40 MG PO TBEC
40.0000 mg | DELAYED_RELEASE_TABLET | Freq: Two times a day (BID) | ORAL | Status: DC
  Administered 2023-10-15 – 2023-10-16 (×2): 40 mg via ORAL
  Filled 2023-10-15 (×2): qty 1

## 2023-10-15 NOTE — Progress Notes (Signed)
 Progress Note   Patient: [name] FMW:[id number] DOB: Jun 01, 1965 DOA: 10/10/2023     4 DOS: the patient was seen and examined on 10/15/2023   Brief hospital course: [name] is a 58 y.o. male with medical history significant of CHF, atrial fibrillation, diastolic CHF, asthma,  liver cirrhosis with esophageal varices and history of hepatic encephalopathy admitted on 10/11/2023 with AKI in the setting of dehydration as well hepatic encephalopathy with poor oral intake.  Assessment and Plan: Acute kidney injury due to dehydration compounded by diuretic use- Creatinine 1.95 > 2.02 >>2.13>>1.84>>1.54>>1.51. (baseline creatinine at 0.8-1.1) Encourage oral diet as he is more alert and awake Hold Aldactone  and Torsemide . Renally adjust medications, avoid nephrotoxic agents/ dehydration/ Hypotension. Patient can be discharge back to SNF once auth approved.   Prolonged QT interval- QTc 531 ms.  Repeat EKG ordered. Avoid QT prolonging drugs. Continue to monitor electrolytes and replace accordingly   Hepatic encephalopathy - More alert since 2 days, able to follow commands. Serum ammonia on admission was 47>>138.  Repeat ammonia 38 today Having liquid BMs, continue lactulose . Continue to hold buspirone , gabapentin , doxepin  until his mental status improves. Encourage diet.  Overall prognosis poor, palliative on board, discussed with legal guardian.   Hypokalemia--- due to lactulose  use with diarrhea as well as Lasix  use. Potassium 2.8 today.  Continue daily oral potassium supplementation. Phosphorus and magnesium  WNL Replace potassium and recheck   Presumed acute endocarditis/ MSSA Bacteremia Patient was noted to be positive for MSSA bacteremia during last admission, unfortunately TEE cannot be done due to history of esophageal varices and thrombocytopenia, so he was started on presumptive endocarditis with placement of PICC line with need for 6-week course of IV antibiotics per ID  recommendations. Continue iv Cefazolin  for MSSA bacteremia with EOT 11/12/23.  Follow-up with video visit with ID on 10/31/2023 at 9:30 AM.   Thrombocytopenia (chronic) due to underlying liver disease Platelets 56 >>36>>43>>48-- continue to monitor platelets.   Bilateral feet wounds--wound care consult appreciated. Follow recommendations of wound care and dressing changes.   Chronic diastolic CHF Echocardiogram done on 10/02/2023 showed LVEF of 70 to 75%.  No WMA.  Moderate asymmetric LVH.  Indeterminate LV diastolic parameters. Caution with fluid overload.  Encourage oral diet, fluids Continue to hold Lasix  and Aldactone  due to dehydration and AKI   Paroxysmal atrial fibrillation EKG showed normal sinus rhythm Patient is not on anticoagulation due to high risk for GI bleed from esophageal varices and chronic thrombocytopenia Continue to hold Home Cardizem  due to soft BP. Resumed low dose metoprolol  xl 50mg  daily (home dose 100mg ).   Acquired hypothyroidism Continue Synthroid .   GERD Continue Protonix .   Alcoholic liver Cirrhosis History of splenic, mesenteric and portal vein thrombosis, thrombocytopenia, portal hypertension with esophageal varices, hepatic encephalopathy Continue lactulose - repeat serum ammonia as above Started metoprolol  XL 50mg  as BP better.   Morbid Obesity- BMI 48.23. Counsel low calorie diet, portion control and increase physical activity discussed with patient   Social/ethics--patient is from [facility] facility. He has a state appointed legal guardian. Palliative care follow up appreciated. Overall prognosis is poor. He is under emergency legal guardian status they cannot update his code status he will have to remain full code/full scope, They are working on obtaining permanent legal guardian status but that can take weeks and until then he will have to remain full code/full scope. Palliative discussion as outpatient for goals of care  discussion.     Out of bed to chair.  Incentive spirometry. Nursing supportive care. Fall, aspiration precautions. Diet:  Diet Orders (From admission, onward)     Start     Ordered   10/14/23 1326  DIET SOFT Room service appropriate? Yes; Fluid consistency: Thin  Diet effective now       Question Answer Comment  Room service appropriate? Yes   Fluid consistency: Thin      10/14/23 1325           DVT prophylaxis: SCDs Start: 10/11/23 0209  Level of care: Med-Surg   Code Status: Full Code  Subjective: Patient is seen and examined today morning.  He is more alert, awake, able to answer. Denies any pain, nausea. No overnight issues.  Physical Exam: Vitals:   10/14/23 1459 10/14/23 1955 10/15/23 0401 10/15/23 1215  BP: (!) 158/112 (!) 138/99 132/89 103/76  Pulse: 66 97 82 98  Resp:   17   Temp:  (!) 97.5 F (36.4 C) (!) 97.4 F (36.3 C) 97.6 F (36.4 C)  TempSrc:  Oral Oral Oral  SpO2:  99% 98% 98%  Weight:      Height:        General - Middle aged obese Caucasian male, sleeping, no distress HEENT - PERRLA, EOMI, atraumatic head, non tender sinuses. Lung -distant breath sounds, basal rales, rhonchi, no wheezes. Heart - S1, S2 heard, no murmurs, rubs, trace pitting pedal edema. Abdomen - Soft, non tender, obese, bowel sounds good Neuro -pain lethargic, unable to do full neuroexam. Skin - Warm and dry.  Data Reviewed:      Latest Ref Rng & Units 10/15/2023    5:16 AM 10/13/2023    4:58 AM 10/12/2023    4:50 AM  CBC  WBC 4.0 - 10.5 K/uL 8.2  7.2  6.2   Hemoglobin 13.0 - 17.0 g/dL 87.4  87.0  86.8   Hematocrit 39.0 - 52.0 % 37.8  37.1  38.4   Platelets 150 - 400 K/uL 48  43  36       Latest Ref Rng & Units 10/15/2023    5:16 AM 10/14/2023    4:36 AM 10/13/2023    4:58 AM  BMP  Glucose 70 - 99 mg/dL 885  877  891   BUN 6 - 20 mg/dL 29  31  33   Creatinine 0.61 - 1.24 mg/dL 8.48  8.45  8.15   Sodium 135 - 145 mmol/L 133  137  136   Potassium 3.5 - 5.1  mmol/L 3.2  2.8  3.2   Chloride 98 - 111 mmol/L 101  104  103   CO2 22 - 32 mmol/L 17  17  18    Calcium 8.9 - 10.3 mg/dL 9.0  9.0  9.0    No results found.  Family Communication: No family at bedside.  Disposition: Status is: Inpatient Remains inpatient appropriate because: poor mental status, AKI, abnormal electrolytes  Planned Discharge Destination: Skilled nursing facility, Auth pending.     Time spent: 43 minutes  Author: Concepcion Riser, MD 10/15/2023 1:53 PM Secure chat 7am to 7pm For on call review [email or web address].

## 2023-10-15 NOTE — [place] FL2 (Signed)
 [place]  MEDICAID [id number] LEVEL OF CARE FORM     IDENTIFICATION  Patient Name: [name] Birthdate: 1965/07/07 Sex: male Admission Date (Current Location): 10/10/2023  [facility] and [place] Number:  [employer] and Address:  [facility],  [address], [place] [address]      Provider Number: [id number]  Attending Physician Name and Address:  Darci Pore, MD  Relative Name and Phone Number:  Everett Potters, guardian [phone]    Current Level of Care: Hospital Recommended Level of Care: Skilled Nursing Facility Prior Approval Number:    Date Approved/Denied:   PASRR Number:    Discharge Plan:      Current Diagnoses: Patient Active Problem List   Diagnosis Date Noted   Acute kidney injury 10/11/2023   Prolonged QT interval 10/11/2023   Hyperammonemia 10/11/2023   Acute endocarditis 10/11/2023   Acquired hypothyroidism 10/11/2023   GERD (gastroesophageal reflux disease) 10/11/2023   AKI (acute kidney injury) 10/11/2023   Staphylococcus aureus bacteremia 10/01/2023   Gram-positive bacteremia 09/30/2023   Paroxysmal atrial fibrillation with RVR (HCC) 09/29/2023   Hepatic encephalopathy (HCC) 09/29/2023   Acute metabolic encephalopathy 09/25/2023   Hyponatremia 04/04/2021   Esophageal varices (HCC) 04/28/2020   History of colonic polyps 04/28/2020   Alcohol abuse 04/28/2020   Acute shoulder pain due to trauma, right 10/12/2019   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 10/12/2019   Localized osteoarthritis of wrist 10/12/2019   Nontraumatic complete tear of right rotator cuff 10/12/2019   Cocaine abuse (HCC) 09/09/2019   1st degree AV block 08/17/2019   Lyme disease 08/17/2019   Fever of unknown origin 08/16/2019   Elevated AST (SGOT) 08/04/2019   Dyslipidemia 07/14/2019   Iron  deficiency anemia 07/08/2019   Migraine without aura 05/18/2019   Acute on chronic diastolic CHF (congestive heart failure) (HCC)  05/14/2019   Scrotal edema 04/22/2019   Bilateral hydrocele 04/22/2019   Pancytopenia (HCC)    Long-term current use of opiate analgesic 03/21/2019   Dizziness 03/21/2019   Meralgia paresthetica of left side 03/21/2019   Polyarthropathy 03/21/2019   Arm mass, right 07/31/2017   Bilateral primary osteoarthritis of knee 04/04/2017   Depression, recurrent 07/18/2016   Encounter for screening for other viral diseases 06/20/2016   Mixed hyperlipidemia 10/31/2015   Leukopenia 07/08/2015   Abdominal wall bulge 03/02/2015   Portal hypertension (HCC) 08/24/2014   Hypokalemia 08/24/2014   COPD, severity to be determined (HCC) 08/24/2014   Biliary colic 07/14/2014   CHF (congestive heart failure) (HCC) 07/14/2014   H/O ETOH abuse 07/14/2014   Thrombocytopenia 07/30/2013   Chronic pain disorder 07/30/2013   Edema 07/30/2013   Genu varus    Hypogonadism male 05/05/2012   Umbilical hernia without obstruction and without gangrene 12/26/2011   Asthma 08/28/2011   Cirrhosis (HCC) 08/14/2011   UNSPECIFIED TACHYCARDIA 02/01/2010   Gout 05/22/2008   Hypertension, essential 05/22/2008   Atrial fibrillation, chronic (HCC) 05/22/2008   DIASTOLIC HEART FAILURE, CHRONIC 05/22/2008   Acute on chronic diastolic heart failure (HCC) 05/22/2008   Morbid obesity with BMI of 50.0-59.9, adult (HCC) 02/21/2007   Coronary atherosclerosis 02/21/2007   Obstructive sleep apnea of adult 02/04/2007    Orientation RESPIRATION BLADDER Height & Weight     Self  Normal External catheter Weight: (!) 355 lb 9.6 oz (161.3 kg) Height:  6' (182.9 cm)  BEHAVIORAL SYMPTOMS/MOOD NEUROLOGICAL BOWEL NUTRITION STATUS      Incontinent Diet (See d/c summary)  AMBULATORY STATUS  COMMUNICATION OF NEEDS Skin   Total Care Verbally Bruising, Skin abrasions, Other (Comment) (Rash to bilateral arms. Unstageable to right heel. Contact dermatitis to bilateral buttocks. Stage I to sacrum. Wound to right buttocks with foam dressing.)                        Personal Care Assistance Level of Assistance  Bathing, Feeding, Dressing Bathing Assistance: Maximum assistance Feeding assistance: Limited assistance Dressing Assistance: Maximum assistance     Functional Limitations Info  Hearing, Sight, Speech Sight Info: Impaired Hearing Info: Adequate Speech Info: Adequate    SPECIAL CARE FACTORS FREQUENCY  PT (By licensed PT), OT (By licensed OT)     PT Frequency: 5x weekly OT Frequency: 5x weekly            Contractures      Additional Factors Info  Code Status, Allergies, Isolation Precautions Code Status Info: Full Allergies Info: Allopurinol   Fluoxetine Hcl     Isolation Precautions Info: Contact precautions- MDRO     Current Medications (10/15/2023):  This is the current hospital active medication list Current Facility-Administered Medications  Medication Dose Route Frequency Provider Last Rate Last Admin   (feeding supplement) PROSource Plus liquid 30 mL  30 mL Oral TID BM Emokpae, Courage, MD   30 mL at 10/15/23 0805   ascorbic acid (VITAMIN C) tablet 500 mg  500 mg Oral BID Pearlean Manus, MD   500 mg at 10/15/23 0808   ceFAZolin  (ANCEF ) IVPB 2g/100 mL premix  2 g Intravenous Q8H BellRosaline T, RPH 200 mL/hr at 10/15/23 0529 2 g at 10/15/23 0529   Chlorhexidine  Gluconate Cloth 2 % PADS 6 each  6 each Topical Daily Emokpae, Courage, MD   6 each at 10/15/23 0810   feeding supplement (ENSURE PLUS HIGH PROTEIN) liquid 237 mL  237 mL Oral BID BM Emokpae, Courage, MD   237 mL at 10/11/23 1402   Gerhardt's butt cream   Topical BID Pearlean Manus, MD   Given at 10/15/23 1232   lactulose  (CHRONULAC ) 10 GM/15ML solution 30 g  30 g Oral TID Adefeso, Oladapo, DO   30 g at 10/15/23 0806   leptospermum manuka honey (MEDIHONEY) paste 1 Application  1 Application Topical Daily Emokpae, Courage, MD   1 Application at 10/15/23 1231   levothyroxine  (SYNTHROID ) tablet 125 mcg  125 mcg Oral Q0600 Adefeso,  Oladapo, DO   125 mcg at 10/15/23 9487   metoprolol  succinate (TOPROL -XL) 24 hr tablet 50 mg  50 mg Oral Daily Sreeram, Narendranath, MD   50 mg at 10/15/23 0808   midodrine (PROAMATINE) tablet 5 mg  5 mg Oral BID WC Adefeso, Oladapo, DO   5 mg at 10/15/23 0808   multivitamin with minerals tablet 1 tablet  1 tablet Oral Daily Emokpae, Courage, MD   1 tablet at 10/15/23 9192   oxyCODONE (Oxy IR/ROXICODONE) immediate release tablet 5 mg  5 mg Oral Q6H PRN Sreeram, Narendranath, MD   5 mg at 10/14/23 2305   pantoprazole  (PROTONIX ) EC tablet 40 mg  40 mg Oral BID Tanda Dempsey SAUNDERS, RPH       potassium chloride  SA (KLOR-CON  M) CR tablet 40 mEq  40 mEq Oral BID Sreeram, Narendranath, MD   40 mEq at 10/15/23 9191   prochlorperazine (COMPAZINE) injection 10 mg  10 mg Intravenous Q6H PRN Adefeso, Oladapo, DO       sodium chloride  flush (NS) 0.9 % injection 10-40 mL  10-40  mL Intracatheter Q12H Emokpae, Courage, MD   10 mL at 10/14/23 0914   sodium chloride  flush (NS) 0.9 % injection 10-40 mL  10-40 mL Intracatheter PRN Emokpae, Courage, MD       sodium chloride  flush (NS) 0.9 % injection 10-40 mL  10-40 mL Intracatheter Q12H Emokpae, Courage, MD   20 mL at 10/15/23 0809   sodium chloride  flush (NS) 0.9 % injection 10-40 mL  10-40 mL Intracatheter PRN Emokpae, Courage, MD       zinc sulfate (50mg  elemental zinc) capsule 220 mg  220 mg Oral Daily Emokpae, Courage, MD   220 mg at 10/15/23 0807     Discharge Medications: Please see discharge summary for a list of discharge medications.  Relevant Imaging Results:  Relevant Lab Results:   Additional Information    Mcarthur Saddie Kim, LCSW

## 2023-10-15 NOTE — Plan of Care (Signed)
  Problem: Acute Rehab OT Goals (only OT should resolve) Goal: Pt. Will Perform Grooming Flowsheets (Taken 10/15/2023 1218) Pt Will Perform Grooming:  with set-up  sitting Goal: Pt. Will Perform Lower Body Bathing Flowsheets (Taken 10/15/2023 1218) Pt Will Perform Lower Body Bathing:  with min assist  sitting/lateral leans  with adaptive equipment Goal: Pt. Will Perform Upper Body Dressing Flowsheets (Taken 10/15/2023 1218) Pt Will Perform Upper Body Dressing:  with set-up  sitting Goal: Pt. Will Perform Lower Body Dressing Flowsheets (Taken 10/15/2023 1218) Pt Will Perform Lower Body Dressing:  with min assist  with adaptive equipment  sitting/lateral leans Goal: Pt. Will Transfer To Toilet Flowsheets (Taken 10/15/2023 1218) Pt Will Transfer to Toilet:  with supervision  ambulating Goal: Pt. Will Perform Toileting-Clothing Manipulation Flowsheets (Taken 10/15/2023 1218) Pt Will Perform Toileting - Clothing Manipulation and hygiene:  with set-up  sitting/lateral leans  with adaptive equipment Goal: Pt/Caregiver Will Perform Home Exercise Program Flowsheets (Taken 10/15/2023 1218) Pt/caregiver will Perform Home Exercise Program:  Increased ROM  Increased strength  Both right and left upper extremity  Independently  Everardo Voris OT, MOT

## 2023-10-15 NOTE — TOC Progression Note (Signed)
 Transition of Care [facility]) - Progression Note    Patient Details  Name: [name] MRN: [id number] Date of Birth: 03-Nov-1965  [facility]) CM/SW Contact  Mcarthur Saddie Kim, [place] Phone Number: 10/15/2023, 1:15 PM  Clinical Narrative:  [facility] updated on pt. CMA starting auth for return to SNF.      Expected Discharge Plan: Skilled Nursing Facility Barriers to Discharge: Continued Medical Work up               Expected Discharge Plan and Services     Post Acute Care Choice: Skilled Nursing Facility                                         Social Drivers of Health (SDOH) Interventions SDOH Screenings   Food Insecurity: Patient Unable To Answer (09/25/2023)  Housing: Low Risk  (09/30/2023)  Transportation Needs: Patient Unable To Answer (09/25/2023)  Utilities: Patient Unable To Answer (09/25/2023)  Recent Concern: Utilities - At Risk (08/18/2023)   Received from [facility]  Depression (PHQ2-9): Low Risk  (03/07/2020)  Financial Resource Strain: Low Risk  (07/22/2023)   [facility]  Physical Activity: Inactive (07/22/2023)   Received from [facility]  Social Connections: Moderately Isolated (07/22/2023)   Received from [facility]  Stress: No Stress Concern Present (08/18/2023)   Received from [facility]  Tobacco Use: High Risk (10/10/2023)  Health Literacy: Low Risk  (07/22/2023)   [facility] from [facility]    Readmission Risk Interventions    10/03/2023    9:50 AM  Readmission Risk Prevention Plan  Transportation Screening Complete  HRI or Home Care Consult Complete  Social Work Consult for Recovery Care Planning/Counseling Complete  Palliative Care Screening Not Applicable  Medication Review Oceanographer) Complete

## 2023-10-15 NOTE — Evaluation (Signed)
 Occupational Therapy Evaluation Patient Details Name: [name] MRN: [id number] DOB: 1965-03-05 Today's Date: 10/15/2023   History of Present Illness   Per ER Chart: [name] is a 58 y.o. male with medical history significant of CHF, atrial fibrillation, diastolic CHF, asthma,  liver cirrhosis with esophageal varices and history of hepatic encephalopathy who presents to the emergency department via EMS from [facility] due to potassium elevation, patient was receiving IV antibiotics via PICC line for presumed endocarditis due to MSSA bacteremia.     Patient was recently admitted from 8/17 to 9/27 due to acute metabolic encephalopathy/hepatic encephalopathy      ED Course:   In the emergency department, he was tachycardic on arrival to the ED with HR of 134 bpm.  Otherwise vital signs were within normal range, O2 sats was initially 64 to 69% on room air, but this improved to 100% on room air.  Workup in ED showed normal CBC except for thrombocytopenia.  BMP showed sodium 132, potassium 3.3, chloride 98, bicarb 19, blood glucose 146, BUN/creatinine 28/1.95 (baseline creatinine at 0.8-1.1), eGFR 39.  Chest x-ray showed hypoventilatory changes with bandlike atelectasis or scarring in the left hilar area.     Clinical Impressions Pt working with physical therapy when this therapist entered the room. Pt required mod A for bed mobility and for step pivot transfer to chair with +2 assist by therapists. Pt required much extended time and rest between tasks. Pt very thirsty and requested water  throughout the session. Max to total assist for lower body ADL's based on observation. Min to mod A for upper body with cognition being a limiting factor for better independence. Pt's cognitive status makes it difficult to fully assess due to pt's reluctance to participate at times. Pt left in the chair with chair alarm set and call bell within reach. Pt will benefit from continued OT in the hospital to  increase strength, balance, and endurance for safe ADL's.        If plan is discharge home, recommend the following:   A lot of help with bathing/dressing/bathroom;Assistance with cooking/housework;Direct supervision/assist for medications management;Assist for transportation;Help with stairs or ramp for entrance;Supervision due to cognitive status;A lot of help with walking and/or transfers     Functional Status Assessment   Patient has had a recent decline in their functional status and demonstrates the ability to make significant improvements in function in a reasonable and predictable amount of time.     Equipment Recommendations   None recommended by OT             Precautions/Restrictions   Precautions Precautions: Fall Recall of Precautions/Restrictions: Impaired Restrictions Weight Bearing Restrictions Per Provider Order: No     Mobility Bed Mobility Overal bed mobility: Needs Assistance Bed Mobility: Supine to Sit     Supine to sit: Mod assist     General bed mobility comments: slow labored  movement; assist to scoot to EOB    Transfers Overall transfer level: Needs assistance Equipment used: 2 person hand held assist Transfers: Sit to/from Stand, Bed to chair/wheelchair/BSC Sit to Stand: Mod assist     Step pivot transfers: Mod assist, +2 physical assistance     General transfer comment: 2 steps to chair with a therapsit on either side providing support along with gait belt use.      Balance Overall balance assessment: Needs assistance Sitting-balance support: Feet supported Sitting balance-Leahy Scale: Fair Sitting balance - Comments: fair/poor seated at EOB   Standing  balance support: Bilateral upper extremity supported, During functional activity Standing balance-Leahy Scale: Poor Standing balance comment: holding onto therapists                           ADL either performed or assessed with clinical judgement   ADL  Overall ADL's : Needs assistance/impaired     Grooming: Minimal assistance;Sitting   Upper Body Bathing: Moderate assistance;Sitting;Minimal assistance   Lower Body Bathing: Maximal assistance;Sitting/lateral leans   Upper Body Dressing : Moderate assistance;Sitting;Minimal assistance   Lower Body Dressing: Maximal assistance;Sitting/lateral leans;Total assistance Lower Body Dressing Details (indicate cue type and reason): Assisted to don socks at bed level today. Toilet Transfer: Moderate assistance;+2 for physical assistance;Stand-pivot Toilet Transfer Details (indicate cue type and reason): EOB to chair without RW Toileting- Clothing Manipulation and Hygiene: Maximal assistance;Total assistance;Bed level               Vision Baseline Vision/History: 1 Wears glasses (per chart) Ability to See in Adequate Light: 1 Impaired Patient Visual Report: No change from baseline Vision Assessment?: No apparent visual deficits     Perception Perception: Not tested       Praxis Praxis: Not tested       Pertinent Vitals/Pain Pain Assessment Pain Assessment: Faces Faces Pain Scale: Hurts a little bit Pain Location: back Pain Descriptors / Indicators: Discomfort Pain Intervention(s): Monitored during session, Repositioned     Extremity/Trunk Assessment Upper Extremity Assessment Upper Extremity Assessment: Difficult to assess due to impaired cognition (Pt able to grasp therapist hand while transfering to the chair. Pt would not demonstrate full range today.)   Lower Extremity Assessment Lower Extremity Assessment: Defer to PT evaluation   Cervical / Trunk Assessment Cervical / Trunk Assessment: Kyphotic   Communication Communication Communication: No apparent difficulties   Cognition Arousal: Alert Behavior During Therapy: Agitated, Restless Cognition: Cognition impaired       Memory impairment (select all impairments): Short-term memory     OT - Cognition  Comments: Awake but confused with poor memory and reluctant to follow directions.                 Following commands: Impaired Following commands impaired: Follows one step commands inconsistently     Cueing  General Comments   Cueing Techniques: Verbal cues;Tactile cues;Gestural cues                 Home Living Family/patient expects to be discharged to:: Skilled nursing facility Living Arrangements: Other (Comment)                                      Prior Functioning/Environment Prior Level of Function : Patient poor historian/Family not available             Mobility Comments: household and short distanced community ambulation with RW PRN (per recent admission. Pt today reported he had not been up at rehab.) ADLs Comments: Independent (per chart)    OT Problem List: Decreased strength;Decreased range of motion;Decreased activity tolerance;Impaired balance (sitting and/or standing);Decreased cognition;Decreased safety awareness;Obesity   OT Treatment/Interventions: Self-care/ADL training;Therapeutic exercise;DME and/or AE instruction;Therapeutic activities;Cognitive remediation/compensation;Patient/family education;Balance training;Visual/perceptual remediation/compensation      OT Goals(Current goals can be found in the care plan section)   Acute Rehab OT Goals Patient Stated Goal: Not go back to the same facility. OT Goal Formulation: With patient Time For Goal Achievement: 10/29/23 Potential  to Achieve Goals: Fair   OT Frequency:  Min 3X/week    Co-evaluation PT/OT/SLP Co-Evaluation/Treatment: Yes Reason for Co-Treatment: To address functional/ADL transfers;Complexity of the patient's impairments (multi-system involvement);For patient/therapist safety;Necessary to address cognition/behavior during functional activity PT goals addressed during session: Mobility/safety with mobility;Balance OT goals addressed during session: ADL's and  self-care                       End of Session Equipment Utilized During Treatment: Gait belt Nurse Communication: Other (comment) (RN notified the pt was in the chair with chair alarm set.)  Activity Tolerance: Patient tolerated treatment well Patient left: in chair;with call bell/phone within reach;with chair alarm set  OT Visit Diagnosis: Unsteadiness on feet (R26.81);Other abnormalities of gait and mobility (R26.89);Muscle weakness (generalized) (M62.81);Other symptoms and signs involving cognitive function                Time: 1005-1044 OT Time Calculation (min): 39 min Charges:  OT General Charges $OT Visit: 1 Visit OT Evaluation $OT Eval Moderate Complexity: 1 Mod  Neidy Guerrieri OT, MOT   Jayson Person 10/15/2023, 12:14 PM

## 2023-10-15 NOTE — Plan of Care (Signed)
  Problem: Acute Rehab PT Goals(only PT should resolve) Goal: Pt Will Go Supine/Side To Sit Flowsheets (Taken 10/15/2023 1100) Pt will go Supine/Side to Sit: with minimal assist Goal: Pt Will Go Sit To Supine/Side Flowsheets (Taken 10/15/2023 1100) Pt will go Sit to Supine/Side: with minimal assist Goal: Patient Will Transfer Sit To/From Stand Flowsheets (Taken 10/15/2023 1100) Patient will transfer sit to/from stand: with minimal assist Goal: Pt Will Ambulate Flowsheets (Taken 10/15/2023 1100) Pt will Ambulate:  10 feet  with +2  with moderate assist  with rolling walker

## 2023-10-15 NOTE — Progress Notes (Signed)
 Mobility Specialist Progress Note:    10/15/23 1450  Mobility  Activity Pivoted/transferred from chair to bed  Level of Assistance Moderate assist, patient does 50-74%  Assistive Device Front wheel walker  Distance Ambulated (ft) 3 ft  Range of Motion/Exercises Active;All extremities  Activity Response Tolerated well  Mobility Referral Yes  Mobility visit 1 Mobility  Mobility Specialist Start Time (ACUTE ONLY) 1450  Mobility Specialist Stop Time (ACUTE ONLY) 1514  Mobility Specialist Time Calculation (min) (ACUTE ONLY) 24 min   Pt received in chair, NT requesting assistance transferring to bed. Required ModA with RW to stand and transfer. Tolerated well, NT in room. All needs met.  Boykin Baetz Mobility Specialist Please contact via Special educational needs teacher or  Rehab office at [phone]

## 2023-10-15 NOTE — Progress Notes (Signed)
 Patient broke his IV tubing that was connected to his PICC, so blood flowed out of the PICC onto the patient and bed. Dr. Darci notified.

## 2023-10-15 NOTE — Evaluation (Signed)
 Physical Therapy Evaluation Patient Details Name: [name] MRN: [id number] DOB: 05-Jul-1965 Today's Date: 10/15/2023  History of Present Illness  Per ER Chart: [name] is a 58 y.o. male with medical history significant of CHF, atrial fibrillation, diastolic CHF, asthma,  liver cirrhosis with esophageal varices and history of hepatic encephalopathy who presents to the emergency department via EMS from [facility] due to potassium elevation, patient was receiving IV antibiotics via PICC line for presumed endocarditis due to MSSA bacteremia.     Patient was recently admitted from 8/17 to 9/27 due to acute metabolic encephalopathy/hepatic encephalopathy      ED Course:   In the emergency department, he was tachycardic on arrival to the ED with HR of 134 bpm.  Otherwise vital signs were within normal range, O2 sats was initially 64 to 69% on room air, but this improved to 100% on room air.  Workup in ED showed normal CBC except for thrombocytopenia.  BMP showed sodium 132, potassium 3.3, chloride 98, bicarb 19, blood glucose 146, BUN/creatinine 28/1.95 (baseline creatinine at 0.8-1.1), eGFR 39.  Chest x-ray showed hypoventilatory changes with bandlike atelectasis or scarring in the left hilar area.  Clinical Impression  PT has decreased activity tolerance, decreased strength, decreased balance and decreased mobility and will benefit from SNF to improve his safety with transfers as well as ambulation.          If plan is discharge home, recommend the following: A lot of help with bathing/dressing/bathroom;Help with stairs or ramp for entrance;Assistance with cooking/housework;Assist for transportation;A lot of help with walking and/or transfers   Can travel by private vehicle   No    Equipment Recommendations None recommended by PT  Recommendations for Other Services   OT    Functional Status Assessment Patient has had a recent decline in their functional status and demonstrates  the ability to make significant improvements in function in a reasonable and predictable amount of time.     Precautions / Restrictions Precautions Precautions: Fall Recall of Precautions/Restrictions: Impaired Restrictions Weight Bearing Restrictions Per Provider Order: No      Mobility  Bed Mobility Overal bed mobility: Needs Assistance Bed Mobility: Supine to Sit     Supine to sit: Mod assist          Transfers Overall transfer level: Needs assistance Equipment used: 2 person hand held assist (refused walker) Transfers: Sit to/from Stand, Bed to chair/wheelchair/BSC Sit to Stand: Mod assist Stand pivot transfers: +2 physical assistance, Mod assist         General transfer comment: 2 small steps to get to chair    Ambulation/Gait   Gait Distance (Feet): 0 Feet   Gait Pattern/deviations: Decreased step length - left, Decreased stance time - right, Decreased dorsiflexion - right, Decreased dorsiflexion - left, Decreased weight shift to right, Decreased weight shift to left, Shuffle             Balance Overall balance assessment: Needs assistance Sitting-balance support: Feet supported Sitting balance-Leahy Scale: Fair     Standing balance support:  (hand held with OT on Rt and PT on left.   Pt leans heavily into therapist.) Standing balance-Leahy Scale: Poor                               Pertinent Vitals/Pain Pain Assessment Pain Assessment: Faces Faces Pain Scale: Hurts a little bit (back; therapist rubbed lotion on low back once sitting)  Home Living Family/patient expects to be discharged to:: Skilled nursing facility Living Arrangements: Other (Comment)                      Prior Function  Pt came from recent Skilled Nursing home admission.  Prior to this pt was from home.                      Extremity/Trunk Assessment        Lower Extremity Assessment Lower Extremity Assessment: Generalized  weakness;Difficult to assess due to impaired cognition       Communication   Communication Communication: No apparent difficulties    Cognition Arousal: Alert Behavior During Therapy: Agitated, Restless   PT - Cognitive impairments: History of cognitive impairments, No family/caregiver present to determine baseline                           Following commands impaired:  (Pt understands commands but is not always cooperative)     Cueing Cueing Techniques: Verbal cues     General Comments      Exercises  Pt uncooperative   Assessment/Plan    PT Assessment Patient needs continued PT services  PT Problem List Decreased strength;Decreased activity tolerance;Decreased balance;Decreased mobility;Obesity;Pain       PT Treatment Interventions DME instruction;Gait training;Stair training;Functional mobility training;Therapeutic activities;Therapeutic exercise;Balance training;Patient/family education    PT Goals (Current goals can be found in the Care Plan section)  Acute Rehab PT Goals Patient Stated Goal: see care plan PT Goal Formulation: Patient unable to participate in goal setting Time For Goal Achievement: 10/22/23 Potential to Achieve Goals: Good    Frequency Min 3X/week     Co-evaluation PT/OT/SLP Co-Evaluation/Treatment: Yes Reason for Co-Treatment: To address functional/ADL transfers;Complexity of the patient's impairments (multi-system involvement);For patient/therapist safety;Necessary to address cognition/behavior during functional activity PT goals addressed during session: Mobility/safety with mobility;Balance         AM-PAC PT 6 Clicks Mobility  Outcome Measure Help needed turning from your back to your side while in a flat bed without using bedrails?: A Lot Help needed moving from lying on your back to sitting on the side of a flat bed without using bedrails?: A Lot Help needed moving to and from a bed to a chair (including a  wheelchair)?: A Lot Help needed standing up from a chair using your arms (e.g., wheelchair or bedside chair)?: A Lot Help needed to walk in hospital room?: Total Help needed climbing 3-5 steps with a railing? : Total 6 Click Score: 10    End of Session Equipment Utilized During Treatment: Gait belt Activity Tolerance: Treatment limited secondary to agitation Patient left: in chair;with call bell/phone within reach;with chair alarm set Nurse Communication: Mobility status PT Visit Diagnosis: Muscle weakness (generalized) (M62.81);Unsteadiness on feet (R26.81);Other abnormalities of gait and mobility (R26.89)    Time: 1025-1050 PT Time Calculation (min) (ACUTE ONLY): 25 min   Charges:   PT Evaluation $PT Eval Moderate Complexity: 1 Mod PT Treatments $Therapeutic Activity: 8-22 mins PT General Charges $$ ACUTE PT VISIT: 1 Visit          Montie Metro, PT CLT [phone]  10/15/2023, 11:02 AM

## 2023-10-16 DIAGNOSIS — R7881 Bacteremia: Secondary | ICD-10-CM

## 2023-10-16 DIAGNOSIS — B9561 Methicillin susceptible Staphylococcus aureus infection as the cause of diseases classified elsewhere: Secondary | ICD-10-CM

## 2023-10-16 DIAGNOSIS — G9341 Metabolic encephalopathy: Secondary | ICD-10-CM

## 2023-10-16 DIAGNOSIS — E871 Hypo-osmolality and hyponatremia: Secondary | ICD-10-CM

## 2023-10-16 LAB — BASIC METABOLIC PANEL WITH GFR
Anion gap: 13 (ref 5–15)
BUN: 25 mg/dL — ABNORMAL HIGH (ref 6–20)
CO2: 18 mmol/L — ABNORMAL LOW (ref 22–32)
Calcium: 9.1 mg/dL (ref 8.9–10.3)
Chloride: 99 mmol/L (ref 98–111)
Creatinine, Ser: 1.26 mg/dL — ABNORMAL HIGH (ref 0.61–1.24)
GFR, Estimated: >60 mL/min (ref >60)
Glucose, Bld: 109 mg/dL — ABNORMAL HIGH (ref 70–99)
Potassium: 3.3 mmol/L — ABNORMAL LOW (ref 3.5–5.1)
Sodium: 130 mmol/L — ABNORMAL LOW (ref 135–145)

## 2023-10-16 LAB — CBC
HCT: 37.1 % — ABNORMAL LOW (ref 39.0–52.0)
Hemoglobin: 12.4 g/dL — ABNORMAL LOW (ref 13.0–17.0)
MCH: 30.8 pg (ref 26.0–34.0)
MCHC: 33.4 g/dL (ref 30.0–36.0)
MCV: 92.3 fL (ref 80.0–100.0)
Platelets: 42 K/uL — ABNORMAL LOW (ref 150–400)
RBC: 4.02 MIL/uL — ABNORMAL LOW (ref 4.22–5.81)
RDW: 19.2 % — ABNORMAL HIGH (ref 11.5–15.5)
WBC: 7.9 K/uL (ref 4.0–10.5)
nRBC: 0 % (ref 0.0–0.2)

## 2023-10-16 LAB — AMMONIA: Ammonia: 44 umol/L — ABNORMAL HIGH (ref 9–35)

## 2023-10-16 MED ORDER — METOPROLOL SUCCINATE ER 100 MG PO TB24: 50.0000 mg | ORAL_TABLET | Freq: Two times a day (BID) | ORAL | Status: DC

## 2023-10-16 MED ORDER — PROSOURCE PLUS PO LIQD: 30.0000 mL | Freq: Three times a day (TID) | ORAL | 1 refills | Status: DC

## 2023-10-16 NOTE — Care Management Important Message (Signed)
 Important Message  Patient Details  Name: [name] MRN: [id number] Date of Birth: Jan 22, 1965   Important Message Given:  N/A - LOS <3 / Initial given by admissions     [name] Ada 10/16/2023, 12:08 PM

## 2023-10-16 NOTE — Discharge Summary (Signed)
 Physician Discharge Summary   Patient: [name] MRN: [id number] DOB: 06-27-1965  Admit date:     10/10/2023  Discharge date: 10/16/23  Discharge Physician: Concepcion Riser   PCP: Carleen Dallas PARAS, FNP (Inactive)   Recommendations at discharge:   PCP follow up in 1 week. Repeat labs for kidney dysfunction. Resume Torsemide , aldactone  pending renal function. Continue Cefazolin  therapy for MSSA bacteremia with EOT 11/12/23.  Follow-up with video visit with ID on 10/31/2023 at 9:30 AM.  Palliative follow up as outpatient for goals of care discussion.  Discharge Diagnoses: Principal Problem:   Acute kidney injury Active Problems:   Morbid obesity with BMI of 50.0-59.9, adult (HCC)   Hypertension, essential   Atrial fibrillation, chronic (HCC)   DIASTOLIC HEART FAILURE, CHRONIC   Cirrhosis (HCC)   Thrombocytopenia   Hypokalemia   Hyponatremia   Acute metabolic encephalopathy   Hepatic encephalopathy (HCC)   Staphylococcus aureus bacteremia   Prolonged QT interval   Hyperammonemia   Acute endocarditis   Acquired hypothyroidism   GERD (gastroesophageal reflux disease)   AKI (acute kidney injury)  Resolved Problems:   * No resolved hospital problems. [facility] Course: [name] is a 58 y.o. male with medical history significant of CHF, atrial fibrillation, diastolic CHF, asthma,  liver cirrhosis with esophageal varices and history of hepatic encephalopathy admitted on 10/11/2023 with AKI in the setting of dehydration as well hepatic encephalopathy with poor oral intake.   Assessment and Plan: Acute kidney injury due to dehydration compounded by diuretic use- Creatinine 1.95 > 2.02 >>2.13>>1.84>>1.54>>1.51>>1.26. (baseline creatinine at 0.8-1.1) Encourage oral diet as he is more alert and awake.  Hold Aldactone  and Torsemide . Repeat labs in 3-4 days to restart diuretic if kidney function improves. Renally adjust medications, avoid nephrotoxic agents/  dehydration/ Hypotension. Patient can be discharged back to SNF with PCP follow up.    Hepatic encephalopathy - More alert since 2-3 days, able to follow commands. Serum ammonia on admission was 47>>138>> 38. Having liquid BMs, continue lactulose . Encourage oral diet, supplements. Overall prognosis poor, palliative discussed with legal guardian. Remains full code until permanent legal guardian found.   Hypokalemia--- due to lactulose  use with diarrhea as well as Lasix  use. Continue daily oral potassium supplementation. Phosphorus and magnesium  WNL Replace bmp as outpatient.   Presumed acute endocarditis/ MSSA Bacteremia Patient was noted to be positive for MSSA bacteremia during last admission, unfortunately TEE cannot be done due to history of esophageal varices and thrombocytopenia, so he was started on presumptive endocarditis with placement of PICC line with need for 6-week course of IV antibiotics per ID recommendations. Continue iv Cefazolin  for MSSA bacteremia with EOT 11/12/23.  Follow-up with video visit with ID on 10/31/2023 at 9:30 AM.  Prolonged QT interval- Repeat EKG with QT 443. Avoid QT prolonging drugs. Continue to monitor electrolytes and replace accordingly as outpatient.   Thrombocytopenia (chronic) due to underlying liver disease Platelets 56 >>36>>43>>48>>42-- continue to monitor platelets.   Bilateral feet wounds--wound care consult appreciated. Follow WOC recommendations of wound care, dressing changes.   Chronic diastolic CHF Echocardiogram done on 10/02/2023 showed LVEF of 70 to 75%.  No WMA.  Moderate asymmetric LVH.  Indeterminate LV diastolic parameters. Caution with fluid overload.  Encourage oral diet, fluids. Continue to hold Lasix  and Aldactone  due to dehydration and AKI.  Hyponatremia- Seem chronic in the setting of CHF, liver disease. Stable.   Paroxysmal atrial fibrillation EKG showed normal sinus rhythm Patient is not on anticoagulation due  to high risk for GI bleed from esophageal varices and chronic thrombocytopenia Resumed low dose metoprolol  xl 50mg  daily (home dose 100mg ). Caution with low BP.   Acquired hypothyroidism Continue Synthroid .   GERD Continue Protonix .   Alcoholic liver Cirrhosis- History of splenic, mesenteric and portal vein thrombosis, thrombocytopenia, portal hypertension with esophageal varices, hepatic encephalopathy Continue lactulose - repeat serum ammonia as above Continue metoprolol  XL 50mg  as BP better.   Morbid Obesity- BMI 48.23. Counsel low calorie diet, portion control and increase physical activity discussed with patient   Social/ethics--patient is from [facility] facility. He has a state appointed legal guardian. Palliative care follow up appreciated. Overall prognosis is poor. He is under emergency legal guardian status they cannot update his code status he will have to remain full code/full scope, They are working on obtaining permanent legal guardian status but that can take weeks and until then he will  remain full code/full scope. Palliative discussion as outpatient for goals of care discussion.          Consultants: Palliative Procedures performed: none  Disposition: Skilled nursing facility Diet recommendation:  Discharge Diet Orders (From admission, onward)     Start     Ordered   10/16/23 0000  Diet - low sodium heart healthy        10/16/23 1305           Cardiac diet DISCHARGE MEDICATION: Allergies as of 10/16/2023       Reactions   Allopurinol  Other (See Comments)   Joint pain worse Caused worsening gout   Fluoxetine Hcl Itching        Medication List     PAUSE taking these medications    spironolactone  50 MG tablet Wait to take this until: October 21, 2023 Commonly known as: ALDACTONE  Take 1 tablet (50 mg total) by mouth daily.   torsemide  20 MG tablet Wait to take this until: October 21, 2023 Commonly known as: DEMADEX  Take 20 mg  by mouth daily.       STOP taking these medications    diltiazem  120 MG 24 hr capsule Commonly known as: CARDIZEM  CD       TAKE these medications    (feeding supplement) PROSource Plus liquid Take 30 mLs by mouth 3 (three) times daily between meals.   albuterol  108 (90 Base) MCG/ACT inhaler Commonly known as: VENTOLIN  HFA INHALE 2 PUFFS BY MOUTH EVERY 6 HOURS AS NEEDED FOR SHORTNESS OF BREATH OR WHEEZING   aspirin  EC 325 MG tablet Take 162.5 mg by mouth daily as needed (anticoagulant).   ceFAZolin  IVPB Commonly known as: ANCEF  Inject 2 g into the vein every 8 (eight) hours. Indication:  MSSA bacteremia First Dose: Yes Last Day of Therapy:  11/12/23 Labs - Once weekly:  CBC/D and CMP Pull PICC line at the completion of IV antibiotic therapy Method of administration: IV Push or per SNF protocol Method of administration may be changed at the discretion of home infusion pharmacist based upon assessment of the patient and/or caregiver's ability to self-administer the medication ordered.   Constulose  10 GM/15ML solution Generic drug: lactulose  TAKE 30 ML BY MOUTH TWICE DAILY What changed: See the new instructions.   cyanocobalamin  500 MCG tablet Commonly known as: V-R VITAMIN B-12 Take 1 tablet (500 mcg total) by mouth every Monday, Wednesday, and Friday.   ezetimibe  10 MG tablet Commonly known as: ZETIA  Take 1 tablet (10 mg total) by mouth daily.   fluticasone  50 MCG/ACT nasal spray Commonly known as:  FLONASE  USE 2 SPRAYS IN EACH NOSTRIL EVERY DAY AS NEEDED   folic acid  800 MCG tablet Commonly known as: FOLVITE  Take 1 tablet (800 mcg total) by mouth daily.   HEPARIN  LOCK FLUSH IV Inject 3 mLs into the vein every 8 (eight) hours. Use 3 mL intravenously every 8 hours for flush of non-valved catheter lumens after med admin. Flush with 10 mL of normal saline before med; 10 mL normal saline after med; followed by heparin .   hydrocortisone  20 MG tablet Commonly known  as: CORTEF  Take 1 tablet (20 mg total) by mouth 2 (two) times daily.   levothyroxine  125 MCG tablet Commonly known as: SYNTHROID  Take 1 tablet (125 mcg total) by mouth daily at 6 (six) AM.   metoprolol  succinate 100 MG 24 hr tablet Commonly known as: TOPROL -XL Take 0.5 tablets (50 mg total) by mouth in the morning and at bedtime. What changed: how much to take   midodrine 5 MG tablet Commonly known as: PROAMATINE Take 5 mg by mouth 2 (two) times daily with a meal.   NORMAL SALINE FLUSH IV Inject 10 mLs into the vein every 8 (eight) hours.   OneTouch Delica Lancets 30G Misc Test BS QID Dx E11.9   pantoprazole  40 MG tablet Commonly known as: PROTONIX  TAKE 1 TABLET BY MOUTH TWICE DAILY   potassium chloride  SA 20 MEQ tablet Commonly known as: KLOR-CON  M Take 20 mEq by mouth daily.   rosuvastatin 5 MG tablet Commonly known as: CRESTOR Take 5 mg by mouth daily.   thiamine  100 MG tablet Commonly known as: VITAMIN B1 Take 100 mg by mouth daily.   ZINC OXIDE (TOPICAL) 10 % Crea Apply 1 Application topically in the morning and at bedtime. Apply to reddened area to groin/scrotum for MASD               Discharge Care Instructions  (From admission, onward)           Start     Ordered   10/16/23 0000  Discharge wound care:       Comments: Cleanse B feet with soap and water , paint all wounds to toes and L lateral foot with Betadine 2 times daily.  May leave open to air or cover with dry gauze/kerlix roll gauze whichever preferred    Cleanse R lateral ankle wound with Vashe wound cleanser Soila [phone]) do not rinse and allow to air dry.  Apply Medihoney to wound bed daily, cover with dry gauze and secure with silicone foam.  Cleanse R heel with Vashe, apply Xeroform gauze Soila [phone]) to wound bed daily and secure with silicone foam or Kerlix roll gauze whichever preferred.   10/16/23 1305            Discharge Exam: Filed Weights   10/10/23 1330 10/11/23  0157  Weight: (!) 168.7 kg (!) 161.3 kg   General - Middle aged obese Caucasian male, sleeping, no distress HEENT - PERRLA, EOMI, atraumatic head, non tender sinuses. Lung -distant breath sounds, basal rales, rhonchi, no wheezes. Heart - S1, S2 heard, no murmurs, rubs, chronic pedal edema with skin changes. Abdomen - Soft, non tender, obese, bowel sounds good Neuro -Alert, awake, able to answer me slowly, non focal exam Skin - Warm and dry.    Condition at discharge: stable  The results of significant diagnostics from this hospitalization (including imaging, microbiology, ancillary and laboratory) are listed below for reference.   Imaging Studies: No results found.   Microbiology: Results for orders  placed or performed during the hospital encounter of 09/25/23  Resp panel by RT-PCR (RSV, Flu A&B, Covid) Anterior Nasal Swab     Status: None   Collection Time: 09/26/23  8:30 AM   Specimen: Anterior Nasal Swab  Result Value Ref Range Status   SARS Coronavirus 2 by RT PCR NEGATIVE NEGATIVE Final    Comment: (NOTE) SARS-CoV-2 target nucleic acids are NOT DETECTED.  The SARS-CoV-2 RNA is generally detectable in upper respiratory specimens during the acute phase of infection. The lowest concentration of SARS-CoV-2 viral copies this assay can detect is 138 copies/mL. A negative result does not preclude SARS-Cov-2 infection and should not be used as the sole basis for treatment or other patient management decisions. A negative result may occur with  improper specimen collection/handling, submission of specimen other than nasopharyngeal swab, presence of viral mutation(s) within the areas targeted by this assay, and inadequate number of viral copies(<138 copies/mL). A negative result must be combined with clinical observations, patient history, and epidemiological information. The expected result is Negative.  Fact Sheet for Patients:   BloggerCourse.com  Fact Sheet for Healthcare Providers:  SeriousBroker.it  This test is no t yet approved or cleared by the [place]  FDA and  has been authorized for detection and/or diagnosis of SARS-CoV-2 by FDA under an Emergency Use Authorization (EUA). This EUA will remain  in effect (meaning this test can be used) for the duration of the COVID-19 declaration under Section 564(b)(1) of the Act, 21 U.S.C.section 360bbb-3(b)(1), unless the authorization is terminated  or revoked sooner.       Influenza A by PCR NEGATIVE NEGATIVE Final   Influenza B by PCR NEGATIVE NEGATIVE Final    Comment: (NOTE) The Xpert Xpress SARS-CoV-2/FLU/RSV plus assay is intended as an aid in the diagnosis of influenza from Nasopharyngeal swab specimens and should not be used as a sole basis for treatment. Nasal washings and aspirates are unacceptable for Xpert Xpress SARS-CoV-2/FLU/RSV testing.  Fact Sheet for Patients: BloggerCourse.com  Fact Sheet for Healthcare Providers: SeriousBroker.it  This test is not yet approved or cleared by the [place]  FDA and has been authorized for detection and/or diagnosis of SARS-CoV-2 by FDA under an Emergency Use Authorization (EUA). This EUA will remain in effect (meaning this test can be used) for the duration of the COVID-19 declaration under Section 564(b)(1) of the Act, 21 U.S.C. section 360bbb-3(b)(1), unless the authorization is terminated or revoked.     Resp Syncytial Virus by PCR NEGATIVE NEGATIVE Final    Comment: (NOTE) Fact Sheet for Patients: BloggerCourse.com  Fact Sheet for Healthcare Providers: SeriousBroker.it  This test is not yet approved or cleared by the [place]  FDA and has been authorized for detection and/or diagnosis of SARS-CoV-2 by FDA under an Emergency Use  Authorization (EUA). This EUA will remain in effect (meaning this test can be used) for the duration of the COVID-19 declaration under Section 564(b)(1) of the Act, 21 U.S.C. section 360bbb-3(b)(1), unless the authorization is terminated or revoked.  Performed at [facility], [address] [place], [place] [address]   Respiratory (~20 pathogens) panel by PCR     Status: None   Collection Time: 09/26/23  8:30 AM   Specimen: Nasopharyngeal Swab; Respiratory  Result Value Ref Range Status   Adenovirus NOT DETECTED NOT DETECTED Final   Coronavirus 229E NOT DETECTED NOT DETECTED Final    Comment: (NOTE) The Coronavirus on the Respiratory Panel, DOES NOT test for the novel  Coronavirus (2019 nCoV)  Coronavirus HKU1 NOT DETECTED NOT DETECTED Final   Coronavirus NL63 NOT DETECTED NOT DETECTED Final   Coronavirus OC43 NOT DETECTED NOT DETECTED Final   Metapneumovirus NOT DETECTED NOT DETECTED Final   Rhinovirus / Enterovirus NOT DETECTED NOT DETECTED Final   Influenza A NOT DETECTED NOT DETECTED Final   Influenza B NOT DETECTED NOT DETECTED Final   Parainfluenza Virus 1 NOT DETECTED NOT DETECTED Final   Parainfluenza Virus 2 NOT DETECTED NOT DETECTED Final   Parainfluenza Virus 3 NOT DETECTED NOT DETECTED Final   Parainfluenza Virus 4 NOT DETECTED NOT DETECTED Final   Respiratory Syncytial Virus NOT DETECTED NOT DETECTED Final   Bordetella pertussis NOT DETECTED NOT DETECTED Final   Bordetella Parapertussis NOT DETECTED NOT DETECTED Final   Chlamydophila pneumoniae NOT DETECTED NOT DETECTED Final   Mycoplasma pneumoniae NOT DETECTED NOT DETECTED Final    Comment: Performed at [facility], [address], [place], [place] [address]  MRSA Next Gen by PCR, Nasal     Status: None   Collection Time: 09/27/23 11:59 AM   Specimen: Nasal Mucosa; Nasal Swab  Result Value Ref Range Status   MRSA by PCR Next Gen NOT DETECTED NOT DETECTED Final    Comment: (NOTE) The GeneXpert MRSA  Assay (FDA approved for NASAL specimens only), is one component of a comprehensive MRSA colonization surveillance program. It is not intended to diagnose MRSA infection nor to guide or monitor treatment for MRSA infections. Test performance is not FDA approved in patients less than 87 years old. Performed at [facility], [address], [place], [place] [address]   Culture, blood (Routine X 2) w Reflex to ID Panel     Status: Abnormal   Collection Time: 09/29/23 12:35 PM   Specimen: BLOOD LEFT HAND  Result Value Ref Range Status   Specimen Description   Final    BLOOD LEFT HAND BOTTLES DRAWN AEROBIC AND ANAEROBIC Performed at [facility], [address] [place], [place] [address]    Special Requests   Final    Blood Culture adequate volume Performed at [facility], [address] [place], [place] [address]    Culture  Setup Time   Final    IN BOTH AEROBIC AND ANAEROBIC BOTTLES GRAM POSITIVE COCCI Gram Stain Report Called to,Read Back By and Verified With: A RAY RN9/22/25 @1006  BY J. WHITE  GRAM STAIN REVIEWED-AGREE WITH RESULT DRT    Culture STAPHYLOCOCCUS AUREUS (A)  Final   Report Status 10/02/2023 FINAL  Final   Organism ID, Bacteria STAPHYLOCOCCUS AUREUS  Final      Susceptibility   Staphylococcus aureus - MIC*    CIPROFLOXACIN  <=0.5 SENSITIVE Sensitive     ERYTHROMYCIN >=8 RESISTANT Resistant     GENTAMICIN <=0.5 SENSITIVE Sensitive     OXACILLIN <=0.25 SENSITIVE Sensitive     TETRACYCLINE <=1 SENSITIVE Sensitive     VANCOMYCIN  <=0.5 SENSITIVE Sensitive     TRIMETH/SULFA <=10 SENSITIVE Sensitive     CLINDAMYCIN RESISTANT Resistant     RIFAMPIN <=0.5 SENSITIVE Sensitive     Inducible Clindamycin POSITIVE Resistant     LINEZOLID 2 SENSITIVE Sensitive     * STAPHYLOCOCCUS AUREUS  Blood Culture ID Panel (Reflexed)     Status: Abnormal   Collection Time: 09/29/23 12:35 PM  Result Value Ref Range Status   Enterococcus faecalis NOT DETECTED NOT DETECTED Final    Enterococcus Faecium NOT DETECTED NOT DETECTED Final   Listeria monocytogenes NOT DETECTED NOT DETECTED Final  Staphylococcus species DETECTED (A) NOT DETECTED Final    Comment: CRITICAL RESULT CALLED TO, READ BACK BY AND VERIFIED WITH: PHARMD LORI POOLE ON 09/30/23 @ 1430 BY DRT    Staphylococcus aureus (BCID) DETECTED (A) NOT DETECTED Final    Comment: CRITICAL RESULT CALLED TO, READ BACK BY AND VERIFIED WITH: PHARMD LORI POOLE ON 09/30/23 @ 1430 BY DRT    Staphylococcus epidermidis NOT DETECTED NOT DETECTED Final   Staphylococcus lugdunensis NOT DETECTED NOT DETECTED Final   Streptococcus species NOT DETECTED NOT DETECTED Final   Streptococcus agalactiae NOT DETECTED NOT DETECTED Final   Streptococcus pneumoniae NOT DETECTED NOT DETECTED Final   Streptococcus pyogenes NOT DETECTED NOT DETECTED Final   A.calcoaceticus-baumannii NOT DETECTED NOT DETECTED Final   Bacteroides fragilis NOT DETECTED NOT DETECTED Final   Enterobacterales NOT DETECTED NOT DETECTED Final   Enterobacter cloacae complex NOT DETECTED NOT DETECTED Final   Escherichia coli NOT DETECTED NOT DETECTED Final   Klebsiella aerogenes NOT DETECTED NOT DETECTED Final   Klebsiella oxytoca NOT DETECTED NOT DETECTED Final   Klebsiella pneumoniae NOT DETECTED NOT DETECTED Final   Proteus species NOT DETECTED NOT DETECTED Final   Salmonella species NOT DETECTED NOT DETECTED Final   Serratia marcescens NOT DETECTED NOT DETECTED Final   Haemophilus influenzae NOT DETECTED NOT DETECTED Final   Neisseria meningitidis NOT DETECTED NOT DETECTED Final   Pseudomonas aeruginosa NOT DETECTED NOT DETECTED Final   Stenotrophomonas maltophilia NOT DETECTED NOT DETECTED Final   Candida albicans NOT DETECTED NOT DETECTED Final   Candida auris NOT DETECTED NOT DETECTED Final   Candida glabrata NOT DETECTED NOT DETECTED Final   Candida krusei NOT DETECTED NOT DETECTED Final   Candida parapsilosis NOT DETECTED NOT DETECTED Final   Candida  tropicalis NOT DETECTED NOT DETECTED Final   Cryptococcus neoformans/gattii NOT DETECTED NOT DETECTED Final   Meth resistant mecA/C and MREJ NOT DETECTED NOT DETECTED Final    Comment: Performed at [facility], [address] [place], [place] [address]  Culture, blood (Routine X 2) w Reflex to ID Panel     Status: Abnormal   Collection Time: 09/29/23 12:50 PM   Specimen: BLOOD RIGHT HAND  Result Value Ref Range Status   Specimen Description   Final    BLOOD RIGHT HAND BOTTLES DRAWN AEROBIC AND ANAEROBIC Performed at [facility], [address], [place], [place] [address]    Special Requests   Final    Blood Culture adequate volume Performed at [facility], [address], [place], [place] [address]    Culture  Setup Time   Final    AEROBIC BOTTLE ONLY GRAM POSITIVE COCCI Gram Stain Report Called to,Read Back By and Verified With: A RAY RN 09/30/23 @1010  BY J. WHITE GRAM STAIN REVIEWED-AGREE WITH RESULT DRT    Culture (A)  Final    STAPHYLOCOCCUS AUREUS SUSCEPTIBILITIES PERFORMED ON PREVIOUS CULTURE WITHIN THE LAST 5 DAYS. Performed at [facility], [address] [place], [place] [address]    Report Status 10/02/2023 FINAL  Final  Culture, blood (Routine X 2) w Reflex to ID Panel     Status: None   Collection Time: 10/01/23  4:02 AM   Specimen: BLOOD LEFT ARM  Result Value Ref Range Status   Specimen Description BLOOD LEFT ARM  Final   Special Requests   Final    BOTTLES DRAWN AEROBIC AND ANAEROBIC Blood Culture adequate volume   Culture   Final    NO GROWTH  5 DAYS Performed at [facility], [address] [place], [place] [address]    Report Status 10/06/2023 FINAL  Final  Culture, blood (Routine X 2) w Reflex to ID Panel     Status: None   Collection Time: 10/01/23  4:05 AM   Specimen: BLOOD LEFT HAND  Result Value Ref Range Status   Specimen Description BLOOD LEFT HAND  Final   Special Requests   Final    BOTTLES DRAWN AEROBIC AND ANAEROBIC Blood Culture  adequate volume   Culture   Final    NO GROWTH 5 DAYS Performed at [facility], [address] [place], [place] [address]    Report Status 10/06/2023 FINAL  Final   *Note: Due to a large number of results and/or encounters for the requested time period, some results have not been displayed. A complete set of results can be found in Results Review.    Labs: CBC: Recent Labs  Lab 10/10/23 1543 10/11/23 0518 10/12/23 0450 10/13/23 0458 10/15/23 0516 10/16/23 0522  WBC 7.8 4.8 6.2 7.2 8.2 7.9  NEUTROABS 6.4  --   --   --   --   --   HGB 14.1 12.3* 13.1 12.9* 12.5* 12.4*  HCT 41.4 36.3* 38.4* 37.1* 37.8* 37.1*  MCV 90.6 90.8 91.2 90.3 92.6 92.3  PLT 56* 36* 36* 43* 48* 42*   Basic Metabolic Panel: Recent Labs  Lab 10/11/23 0518 10/12/23 0450 10/13/23 0458 10/14/23 0436 10/15/23 0516 10/16/23 0522  NA 134* 134* 136 137 133* 130*  K 3.8 3.2* 3.2* 2.8* 3.2* 3.3*  CL 101 102 103 104 101 99  CO2 20* 18* 18* 17* 17* 18*  GLUCOSE 122* 99 108* 122* 114* 109*  BUN 31* 33* 33* 31* 29* 25*  CREATININE 2.00* 2.13* 1.84* 1.54* 1.51* 1.26*  CALCIUM 8.8* 8.7* 9.0 9.0 9.0 9.1  MG 2.1  --  2.0  --   --   --   PHOS 2.9 2.5 2.3*  --   --   --    Liver Function Tests: Recent Labs  Lab 10/11/23 0518 10/12/23 0450 10/13/23 0458  AST 58*  --   --   ALT 11  --   --   ALKPHOS 133*  --   --   BILITOT 3.9*  --   --   PROT 6.1*  --   --   ALBUMIN  2.7* 2.6* 2.7*   CBG: No results for input(s): GLUCAP in the last 168 hours.  Discharge time spent: 36 minutes.  Signed: Concepcion Riser, MD [facility] 10/16/2023

## 2023-10-16 NOTE — Progress Notes (Signed)
 Mobility Specialist Progress Note:    10/16/23 1050  Mobility  Activity Pivoted/transferred from bed to chair  Level of Assistance Moderate assist, patient does 50-74%  Assistive Device Front wheel walker  Distance Ambulated (ft) 3 ft  Range of Motion/Exercises Active;All extremities  Activity Response Tolerated well  Mobility Referral Yes  Mobility visit 1 Mobility  Mobility Specialist Start Time (ACUTE ONLY) 1050  Mobility Specialist Stop Time (ACUTE ONLY) 1120  Mobility Specialist Time Calculation (min) (ACUTE ONLY) 30 min   Pt received in bed, agreeable to mobility. Required ModA to stand and transfer with RW. Tolerated well, asx throughout. NT in room, all needs met.  Hartley Urton Mobility Specialist Please contact via Special educational needs teacher or  Rehab office at [phone]

## 2023-10-16 NOTE — TOC Transition Note (Addendum)
 Transition of Care [facility]) - Discharge Note   Patient Details  Name: [name] MRN: [id number] Date of Birth: 08-Oct-1965  Transition of Care [facility]) CM/SW Contact:  Noreen KATHEE Pinal, LCSWA Phone Number: 10/16/2023, 1:14 PM   Clinical Narrative:     Patient is discharging today back to [facility]. CSW updated MD and nurse with room and report number. CSW contacted LG and updated her. Med necessity completed. Will call EMS after report is made. CSW signing off.   Addendum 2:20 PM   EMS called - should be arriving in the next 30 minutes. Updated nurse.   Final next level of care: Skilled Nursing Facility Barriers to Discharge: Barriers Resolved   Patient Goals and CMS Choice Patient states their goals for this hospitalization and ongoing recovery are:: return back to [facility] CMS Medicare.gov Compare Post Acute Care list provided to:: Patient Represenative (must comment) (LG : Jennie) Choice offered to / list presented to : [facility] POA / Guardian [place] ownership interest in [facility].provided to:: [facility] POA / Guardian    Discharge Placement              Patient chooses bed at:  [facility]) Patient to be transferred to facility by: EMS Name of family member notified: Sueanne - LG Patient and family notified of of transfer: 10/16/23  Discharge Plan and Services Additional resources added to the After Visit Summary for       Post Acute Care Choice: Skilled Nursing Facility                               Social Drivers of Health (SDOH) Interventions SDOH Screenings   Food Insecurity: Patient Unable To Answer (09/25/2023)  Housing: Low Risk  (09/30/2023)  Transportation Needs: Patient Unable To Answer (09/25/2023)  Utilities: Patient Unable To Answer (09/25/2023)  Recent Concern: Utilities - At Risk (08/18/2023)   Received from [facility]  Depression (PHQ2-9): Low Risk  (03/07/2020)  Financial Resource Strain: Low Risk  (07/22/2023)   Received from [facility]  Physical Activity: Inactive (07/22/2023)   Received from [facility]  Social Connections: Moderately Isolated (07/22/2023)   Received from [facility]  Stress: No Stress Concern Present (08/18/2023)   Received from [facility]  Tobacco Use: High Risk (10/10/2023)  Health Literacy: Low Risk  (07/22/2023)   Received from [facility]     Readmission Risk Interventions    10/16/2023    1:09 PM 10/03/2023    9:50 AM  Readmission Risk Prevention Plan  Transportation Screening Complete Complete  HRI or Home Care Consult  Complete  Social Work Consult for Recovery Care Planning/Counseling  Complete  Palliative Care Screening  Not Applicable  Medication Review Oceanographer) Complete Complete  HRI or Home Care Consult Complete   SW Recovery Care/Counseling Consult Complete   Palliative Care Screening Complete   Skilled Nursing Facility Complete

## 2023-10-16 NOTE — Plan of Care (Signed)

## 2023-10-18 DIAGNOSIS — N179 Acute kidney failure, unspecified: Secondary | ICD-10-CM | POA: Diagnosis not present

## 2023-10-18 DIAGNOSIS — G9341 Metabolic encephalopathy: Secondary | ICD-10-CM | POA: Diagnosis not present

## 2023-10-18 NOTE — Telephone Encounter (Signed)
   Front office attempted to contact patient to reschedule appointment. Voicemail is full and unable to receive messages.

## 2023-10-19 ENCOUNTER — Other Ambulatory Visit: Payer: Self-pay

## 2023-10-19 ENCOUNTER — Inpatient Hospital Stay (HOSPITAL_COMMUNITY): Admission: EM | Admit: 2023-10-19 | Discharge: 2023-11-09 | DRG: 871 | Disposition: E | Source: Skilled Nursing Facility

## 2023-10-19 ENCOUNTER — Emergency Department (HOSPITAL_COMMUNITY)

## 2023-10-19 ENCOUNTER — Encounter (HOSPITAL_COMMUNITY): Payer: Self-pay | Admitting: Emergency Medicine

## 2023-10-19 DIAGNOSIS — Z720 Tobacco use: Secondary | ICD-10-CM

## 2023-10-19 DIAGNOSIS — J440 Chronic obstructive pulmonary disease with acute lower respiratory infection: Secondary | ICD-10-CM | POA: Diagnosis not present

## 2023-10-19 DIAGNOSIS — D689 Coagulation defect, unspecified: Secondary | ICD-10-CM | POA: Diagnosis present

## 2023-10-19 DIAGNOSIS — H510 Palsy (spasm) of conjugate gaze: Secondary | ICD-10-CM | POA: Diagnosis not present

## 2023-10-19 DIAGNOSIS — K219 Gastro-esophageal reflux disease without esophagitis: Secondary | ICD-10-CM | POA: Diagnosis present

## 2023-10-19 DIAGNOSIS — K7682 Hepatic encephalopathy: Secondary | ICD-10-CM | POA: Diagnosis not present

## 2023-10-19 DIAGNOSIS — F1411 Cocaine abuse, in remission: Secondary | ICD-10-CM | POA: Diagnosis present

## 2023-10-19 DIAGNOSIS — L89514 Pressure ulcer of right ankle, stage 4: Secondary | ICD-10-CM | POA: Diagnosis present

## 2023-10-19 DIAGNOSIS — Z800 Family history of malignant neoplasm of digestive organs: Secondary | ICD-10-CM

## 2023-10-19 DIAGNOSIS — G4733 Obstructive sleep apnea (adult) (pediatric): Secondary | ICD-10-CM | POA: Diagnosis present

## 2023-10-19 DIAGNOSIS — I330 Acute and subacute infective endocarditis: Secondary | ICD-10-CM | POA: Diagnosis present

## 2023-10-19 DIAGNOSIS — L89323 Pressure ulcer of left buttock, stage 3: Secondary | ICD-10-CM | POA: Diagnosis present

## 2023-10-19 DIAGNOSIS — A4152 Sepsis due to Pseudomonas: Principal | ICD-10-CM | POA: Diagnosis present

## 2023-10-19 DIAGNOSIS — I5032 Chronic diastolic (congestive) heart failure: Secondary | ICD-10-CM | POA: Diagnosis present

## 2023-10-19 DIAGNOSIS — Z792 Long term (current) use of antibiotics: Secondary | ICD-10-CM

## 2023-10-19 DIAGNOSIS — K7030 Alcoholic cirrhosis of liver without ascites: Secondary | ICD-10-CM | POA: Diagnosis present

## 2023-10-19 DIAGNOSIS — I10 Essential (primary) hypertension: Secondary | ICD-10-CM | POA: Diagnosis not present

## 2023-10-19 DIAGNOSIS — Z515 Encounter for palliative care: Secondary | ICD-10-CM

## 2023-10-19 DIAGNOSIS — L409 Psoriasis, unspecified: Secondary | ICD-10-CM | POA: Diagnosis present

## 2023-10-19 DIAGNOSIS — R4020 Unspecified coma: Secondary | ICD-10-CM | POA: Diagnosis present

## 2023-10-19 DIAGNOSIS — Z6841 Body Mass Index (BMI) 40.0 and over, adult: Secondary | ICD-10-CM

## 2023-10-19 DIAGNOSIS — Z7982 Long term (current) use of aspirin: Secondary | ICD-10-CM

## 2023-10-19 DIAGNOSIS — L89610 Pressure ulcer of right heel, unstageable: Secondary | ICD-10-CM | POA: Diagnosis present

## 2023-10-19 DIAGNOSIS — R531 Weakness: Secondary | ICD-10-CM | POA: Diagnosis not present

## 2023-10-19 DIAGNOSIS — E039 Hypothyroidism, unspecified: Secondary | ICD-10-CM | POA: Diagnosis present

## 2023-10-19 DIAGNOSIS — K5900 Constipation, unspecified: Secondary | ICD-10-CM | POA: Diagnosis present

## 2023-10-19 DIAGNOSIS — R339 Retention of urine, unspecified: Secondary | ICD-10-CM | POA: Diagnosis not present

## 2023-10-19 DIAGNOSIS — Z79890 Hormone replacement therapy (postmenopausal): Secondary | ICD-10-CM

## 2023-10-19 DIAGNOSIS — D649 Anemia, unspecified: Secondary | ICD-10-CM | POA: Diagnosis present

## 2023-10-19 DIAGNOSIS — E874 Mixed disorder of acid-base balance: Secondary | ICD-10-CM | POA: Diagnosis not present

## 2023-10-19 DIAGNOSIS — D696 Thrombocytopenia, unspecified: Secondary | ICD-10-CM | POA: Diagnosis present

## 2023-10-19 DIAGNOSIS — B9561 Methicillin susceptible Staphylococcus aureus infection as the cause of diseases classified elsewhere: Secondary | ICD-10-CM | POA: Diagnosis present

## 2023-10-19 DIAGNOSIS — I8510 Secondary esophageal varices without bleeding: Secondary | ICD-10-CM | POA: Diagnosis present

## 2023-10-19 DIAGNOSIS — Z66 Do not resuscitate: Secondary | ICD-10-CM | POA: Diagnosis not present

## 2023-10-19 DIAGNOSIS — I878 Other specified disorders of veins: Secondary | ICD-10-CM | POA: Diagnosis present

## 2023-10-19 DIAGNOSIS — M109 Gout, unspecified: Secondary | ICD-10-CM | POA: Diagnosis present

## 2023-10-19 DIAGNOSIS — M069 Rheumatoid arthritis, unspecified: Secondary | ICD-10-CM | POA: Diagnosis present

## 2023-10-19 DIAGNOSIS — G629 Polyneuropathy, unspecified: Secondary | ICD-10-CM | POA: Diagnosis present

## 2023-10-19 DIAGNOSIS — R6521 Severe sepsis with septic shock: Secondary | ICD-10-CM | POA: Diagnosis not present

## 2023-10-19 DIAGNOSIS — L89313 Pressure ulcer of right buttock, stage 3: Secondary | ICD-10-CM | POA: Diagnosis present

## 2023-10-19 DIAGNOSIS — N179 Acute kidney failure, unspecified: Secondary | ICD-10-CM | POA: Diagnosis present

## 2023-10-19 DIAGNOSIS — J4489 Other specified chronic obstructive pulmonary disease: Secondary | ICD-10-CM | POA: Diagnosis present

## 2023-10-19 DIAGNOSIS — I6501 Occlusion and stenosis of right vertebral artery: Secondary | ICD-10-CM | POA: Diagnosis not present

## 2023-10-19 DIAGNOSIS — D6959 Other secondary thrombocytopenia: Secondary | ICD-10-CM | POA: Diagnosis present

## 2023-10-19 DIAGNOSIS — R404 Transient alteration of awareness: Secondary | ICD-10-CM | POA: Diagnosis not present

## 2023-10-19 DIAGNOSIS — E2740 Unspecified adrenocortical insufficiency: Secondary | ICD-10-CM | POA: Diagnosis present

## 2023-10-19 DIAGNOSIS — Z8619 Personal history of other infectious and parasitic diseases: Secondary | ICD-10-CM

## 2023-10-19 DIAGNOSIS — L89151 Pressure ulcer of sacral region, stage 1: Secondary | ICD-10-CM | POA: Diagnosis present

## 2023-10-19 DIAGNOSIS — I81 Portal vein thrombosis: Secondary | ICD-10-CM | POA: Diagnosis present

## 2023-10-19 DIAGNOSIS — F05 Delirium due to known physiological condition: Secondary | ICD-10-CM | POA: Diagnosis present

## 2023-10-19 DIAGNOSIS — F419 Anxiety disorder, unspecified: Secondary | ICD-10-CM | POA: Diagnosis present

## 2023-10-19 DIAGNOSIS — J151 Pneumonia due to Pseudomonas: Secondary | ICD-10-CM | POA: Diagnosis not present

## 2023-10-19 DIAGNOSIS — K429 Umbilical hernia without obstruction or gangrene: Secondary | ICD-10-CM | POA: Diagnosis present

## 2023-10-19 DIAGNOSIS — Z79899 Other long term (current) drug therapy: Secondary | ICD-10-CM

## 2023-10-19 DIAGNOSIS — Z95828 Presence of other vascular implants and grafts: Secondary | ICD-10-CM

## 2023-10-19 DIAGNOSIS — I110 Hypertensive heart disease with heart failure: Secondary | ICD-10-CM | POA: Diagnosis present

## 2023-10-19 DIAGNOSIS — R7881 Bacteremia: Secondary | ICD-10-CM | POA: Insufficient documentation

## 2023-10-19 DIAGNOSIS — I480 Paroxysmal atrial fibrillation: Secondary | ICD-10-CM | POA: Diagnosis present

## 2023-10-19 DIAGNOSIS — G9341 Metabolic encephalopathy: Secondary | ICD-10-CM | POA: Diagnosis present

## 2023-10-19 DIAGNOSIS — R569 Unspecified convulsions: Secondary | ICD-10-CM | POA: Diagnosis present

## 2023-10-19 LAB — COMPREHENSIVE METABOLIC PANEL WITH GFR
ALT: 7 U/L (ref 0–44)
AST: 63 U/L — ABNORMAL HIGH (ref 15–41)
Albumin: 2.6 g/dL — ABNORMAL LOW (ref 3.5–5.0)
Alkaline Phosphatase: 168 U/L — ABNORMAL HIGH (ref 38–126)
Anion gap: 13 (ref 5–15)
BUN: 31 mg/dL — ABNORMAL HIGH (ref 6–20)
CO2: 19 mmol/L — ABNORMAL LOW (ref 22–32)
Calcium: 9 mg/dL (ref 8.9–10.3)
Chloride: 100 mmol/L (ref 98–111)
Creatinine, Ser: 1.89 mg/dL — ABNORMAL HIGH (ref 0.61–1.24)
GFR, Estimated: 40 mL/min — ABNORMAL LOW (ref >60)
Glucose, Bld: 93 mg/dL (ref 70–99)
Potassium: 4.2 mmol/L (ref 3.5–5.1)
Sodium: 131 mmol/L — ABNORMAL LOW (ref 135–145)
Total Bilirubin: 3.6 mg/dL — ABNORMAL HIGH (ref 0.0–1.2)
Total Protein: 6 g/dL — ABNORMAL LOW (ref 6.5–8.1)

## 2023-10-19 LAB — CBC WITH DIFFERENTIAL/PLATELET
Abs Immature Granulocytes: 0.03 K/uL (ref 0.00–0.07)
Basophils Absolute: 0 K/uL (ref 0.0–0.1)
Basophils Relative: 0 %
Eosinophils Absolute: 0.1 K/uL (ref 0.0–0.5)
Eosinophils Relative: 2 %
HCT: 33.6 % — ABNORMAL LOW (ref 39.0–52.0)
Hemoglobin: 11.3 g/dL — ABNORMAL LOW (ref 13.0–17.0)
Immature Granulocytes: 0 %
Lymphocytes Relative: 11 %
Lymphs Abs: 0.8 K/uL (ref 0.7–4.0)
MCH: 31 pg (ref 26.0–34.0)
MCHC: 33.6 g/dL (ref 30.0–36.0)
MCV: 92.3 fL (ref 80.0–100.0)
Monocytes Absolute: 0.4 K/uL (ref 0.1–1.0)
Monocytes Relative: 5 %
Neutro Abs: 6.1 K/uL (ref 1.7–7.7)
Neutrophils Relative %: 82 %
Platelets: 38 K/uL — ABNORMAL LOW (ref 150–400)
RBC: 3.64 MIL/uL — ABNORMAL LOW (ref 4.22–5.81)
RDW: 20.1 % — ABNORMAL HIGH (ref 11.5–15.5)
WBC: 7.4 K/uL (ref 4.0–10.5)
nRBC: 0 % (ref 0.0–0.2)

## 2023-10-19 LAB — AMMONIA: Ammonia: 247 umol/L — ABNORMAL HIGH (ref 9–35)

## 2023-10-19 LAB — FOLATE: Folate: 5.9 ng/mL — ABNORMAL LOW (ref >5.9)

## 2023-10-19 LAB — TSH: TSH: 16.3 u[IU]/mL — ABNORMAL HIGH (ref 0.350–4.500)

## 2023-10-19 LAB — PROTIME-INR
INR: 2.3 — ABNORMAL HIGH (ref 0.8–1.2)
Prothrombin Time: 26.7 s — ABNORMAL HIGH (ref 11.4–15.2)

## 2023-10-19 LAB — APTT: aPTT: 75 s — ABNORMAL HIGH (ref 24–36)

## 2023-10-19 LAB — ETHANOL: Alcohol, Ethyl (B): <15 mg/dL (ref <15)

## 2023-10-19 LAB — MAGNESIUM: Magnesium: 2.1 mg/dL (ref 1.7–2.4)

## 2023-10-19 LAB — VITAMIN B12: Vitamin B-12: >4000 pg/mL — ABNORMAL HIGH (ref 180–914)

## 2023-10-19 MED ORDER — LACTULOSE ENEMA
300.0000 mL | ORAL | Status: AC
  Administered 2023-10-19: 300 mL via RECTAL
  Filled 2023-10-19: qty 300

## 2023-10-19 MED ORDER — IOHEXOL 350 MG/ML SOLN: 60.0000 mL | Freq: Once | INTRAVENOUS | Status: DC | PRN

## 2023-10-19 MED ORDER — LEVETIRACETAM (KEPPRA) 500 MG/5 ML ADULT IV PUSH
3000.0000 mg | Freq: Once | INTRAVENOUS | Status: AC
  Administered 2023-10-20: 3000 mg via INTRAVENOUS
  Filled 2023-10-19: qty 30

## 2023-10-19 MED ORDER — LEVETIRACETAM (KEPPRA) 500 MG/5 ML ADULT IV PUSH
1500.0000 mg | Freq: Once | INTRAVENOUS | Status: AC
  Administered 2023-10-19: 1500 mg via INTRAVENOUS
  Filled 2023-10-19: qty 15

## 2023-10-19 NOTE — Consult Note (Signed)
 [facility] Consult Services   Patient Name:   [name], [name] Date of Birth:   06-27-1965 Identification Number:   MRN - [id number] Date of Service:   10/19/2023 22:43:39  Diagnosis:       G93.41 - Encephalopathy Metabolic  Impression:       58 year old man with a history of cirrhosis, with hepatic encephalopathy, thrombocytopenia, atrial fibrillation who presents with altered mental status, and reported gaze deviation to the right. On examination, he is primarily severely encephalopathic. He however had gaze preference to the right side which he could break and decrease antigravity movement with the left arm compared to the right.    Review of his lab workup is notable for ammonia 247. Level was 44 3 days ago prior to discharge. Hepatic encephalopathy would also account for a progressive decline in mentation through the day. With the focal symptoms, would have to consider primary neurologic processes like seizure and stroke, however focal neurologic symptoms can also be seen from the hepatic encephalopathy.    Not a candidate for IV thrombolytic based on his platelet level and last known well. Considered obtaining CTA for intervention, but based on his described functional state at the time of recent discharge, his comorbid conditions including concern for endocarditis and his AKI, would not be a good candidate for neurovascular intervention. Did not proceed with study as this time as benefit would not outweigh the risk.  Our recommendations are outlined below.  Recommendations:        Stroke/Telemetry Floor       Neuro Checks       Bedside Swallow Eval       DVT Prophylaxis       IV Fluids, Normal Saline       Head of Bed 30 Degrees       Euglycemia and Avoid Hyperthermia (PRN Acetaminophen )       Management of hepatic encephalopathy is recommended with plan for brain MRI if symptoms not resolving with adequate treatment.       Antiplatelet is  contraindicated with platelet level less than 50,000  Sign Out:       Discussed with Emergency Department Provider    ------------------------------------------------------------------------------  Advanced Imaging: Advanced Imaging Deferred because:  Poor functional status at baseline, a greater risk than benefit with acute intervention   Metrics: Last Known Well: 10/19/2023 07:00:00 Dispatch Time: 10/19/2023 22:43:39 Arrival Time: 10/19/2023 23:17:36 Initial Response Time: 10/19/2023 22:47:07 Symptoms: Altered mental status, right gaze deviation. Initial patient interaction: 10/19/2023 22:55:34 NIHSS Assessment Completed: 10/19/2023 23:00:25 Patient is not a candidate for Thrombolytic. Thrombolytic Medical Decision: 10/19/2023 23:00:26 Patient was not deemed candidate for Thrombolytic because of following reasons: Low Platelet count <100 000/mm3 .  CT Head: I personally reviewed all the CT images that were available to me and it showed: no acute intracranial process  Primary Provider Notified of Diagnostic Impression and Management Plan on: 10/19/2023 23:38:47    ------------------------------------------------------------------------------  History of Present Illness: Patient is a 58 year old Male.  Patient was brought by private transportation with symptoms of Altered mental status, right gaze deviation. Presents from skilled nursing facility with right gaze deviation and altered mental status. History obtained from physician. Reportedly last seen at baseline at about 7 AM and progressively became more obtunded. Not eating or drinking today. Has a history of hepatic encephalopathy and did not take his lactulose .   He is unable to give any history. Review of records shows he was recently discharged from the hospital  on 10/8. At that time had presented with altered mental status. Recent history is also notable for receiving IV antibiotics via PICC line due to concern for  endocarditis, related to MSSA bacteremia.  Unable to fully assess modified Rankin scale, but on physical therapy note on 10/17, was not able to ambulate, and balance was so poor he needed support.    Past Medical History:      Atrial Fibrillation Other PMH:  Thrombocytopenia, psoriasis, neuropathy, cirrhosis, unable to obtain due to:   Patient Is Obtunded/ Comatose  Medications:  No Anticoagulant use  No Antiplatelet use Reviewed EMR for current medications  Allergies:   Allergies Unable To Obtain Due To: Patient Is Obtunded/ Comatose  Social History: Unable To Obtain Due To Patient Status : Patient Is Obtunded/ Comatose  Family History:  Family History Cannot Be Obtained Because:Patient Is Obtunded/ Comatose  ROS : ROS Cannot Be Obtained Because:  Patient Is Obtunded/ Comatose  Past Surgical History: Past Surgical History Cannot Be Obtained Because: Patient Is Obtunded/ Comatose There Is No Surgical History Contributory To Today's Visit     Examination: BP(137/77), Pulse(64), 1A: Level of Consciousness - Movements to Pain + 2 1B: Ask Month and Age - Could Not Answer Either Question Correctly + 2 1C: Blink Eyes & Squeeze Hands - Performs 0 Tasks + 2 2: Test Horizontal Extraocular Movements - Partial Gaze Palsy: Can Be Overcome + 1 3: Test Visual Fields - Patient is Bilaterally Blind + 3 4: Test Facial Palsy (Use Grimace if Obtunded) - Normal symmetry + 0 5A: Test Left Arm Motor Drift - Some Effort Against Gravity + 2 5B: Test Right Arm Motor Drift - Some Effort Against Gravity + 2 6A: Test Left Leg Motor Drift - No Drift for 5 Seconds + 0 6B: Test Right Leg Motor Drift - No Drift for 5 Seconds + 0 7: Test Limb Ataxia (FNF/Heel-Shin) - No Ataxia + 0 8: Test Sensation - Normal; No sensory loss + 0 9: Test Language/Aphasia - Coma/Unresponsive + 3 10: Test Dysarthria - Mute/Anarthric + 2 11: Test Extinction/Inattention - No abnormality + 0  NIHSS Score: 19  NIHSS  Free Text : Roving eye movements but with gaze preference to the right. Facial asymmetry testing is limited by his beard. Does not blink to threat. Right arm does not move as high up against gravity as he does with the left. Is able to localize with the left hand. Withdraws from noxious stimulation bilaterally  Pre-Morbid Modified Rankin Scale: Unable to assess  Spoke with : Dr. Cleotilde I reviewed the available imaging via [facility] and initiated discussion with the primary provider  This consult was conducted in real time using interactive audio and video technology. Patient was informed of the technology being used for this visit and agreed to proceed. Patient located in hospital and provider located at home/office setting.   Patient is being evaluated for possible acute neurologic impairment and high probability of imminent or life-threatening deterioration. I spent total of 35 minutes providing care to this patient, including time for face to face visit via telemedicine, review of medical records, imaging studies and discussion of findings with providers, the patient and/or family.    Dr Claudette Dates   [facility] For Inpatient follow-up with [facility] physician please call [facility] at [phone]. As we are not an outpatient service for any post hospital discharge needs please contact the hospital for assistance. If you have any questions for the [facility] physicians or need to reconsult for  clinical or diagnostic changes please contact us  via [facility] at [phone].   Signature : Claudette Dates

## 2023-10-19 NOTE — ED Notes (Signed)
 Placed a flexiseal and administered a lactulose  enema. Pt held about 1/2 of the enema and the rest went around the seal.

## 2023-10-19 NOTE — ED Triage Notes (Signed)
 Pt bib [facility] from [facility]. They called due to ams and right gaze. Noticed at 7pm when her shift started. Per nurse at [facility] pt has not eaten today. Cbg 104. Edp at bedside upon arrival. Pt has right side gaze present and restless. Pt moving all extremities.

## 2023-10-19 NOTE — ED Notes (Signed)
[facility] called for transport ED to ED.

## 2023-10-19 NOTE — ED Provider Notes (Signed)
 [place] EMERGENCY DEPARTMENT AT [facility] Provider Note   CSN: [id number] Arrival date & time: 10/19/23  2132     Patient presents with: Altered Mental Status   [name] is a 58 y.o. male.    Altered Mental Status  This patient is a chronically ill-appearing 58 year old male, he has been admitted to the hospital a couple of times over the last month, initially for hypokalemia, hepatic encephalopathy, known history of alcoholic cirrhosis, readmitted approximately 1 week ago with another acute kidney injury, currently living at a local nursing facility, he presents with a complaint of altered mental status this evening.  The history is not very thorough, paramedics were not able to gather much information from the facility, evidently he has had some progressive altered mental status today.  The patient has a PICC line in his left arm, he is receiving antibiotics, it appears that he is getting Ancef  which had been prescribed September 27 and for which he is supposed to take through November 6  Review of the discharge summary from the September admission showed that he had methicillin susceptible Staph aureus bacteremia at that time.  He was also diagnosed with myxedema hypothyroidism and was placed on levothyroxine  and was to continue oral Cortef  for adrenal insufficiency.  The patient's ammonia level was up to 138 on October 5, it came down to 44 on the day of discharge  The patient is not able to give any information  Chelsea - RN taking care of him - said that he has been like this all day - no urine or stool all day - was worse tonight - states that he does stand and walk and talk but was very off today.  The staff at the [facility] state he was last seen normal at 7 AM this morning.      Prior to Admission medications   Medication Sig Start Date End Date Taking? Authorizing Provider  albuterol  (VENTOLIN  HFA) 108 (90 Base) MCG/ACT inhaler INHALE 2  PUFFS BY MOUTH EVERY 6 HOURS AS NEEDED FOR SHORTNESS OF BREATH OR WHEEZING 12/04/22   Jude Harden GAILS, MD  aspirin  EC 325 MG tablet Take 162.5 mg by mouth daily as needed (anticoagulant).    [provider]  ceFAZolin  (ANCEF ) IVPB Inject 2 g into the vein every 8 (eight) hours. Indication:  MSSA bacteremia First Dose: Yes Last Day of Therapy:  11/12/23 Labs - Once weekly:  CBC/D and CMP Pull PICC line at the completion of IV antibiotic therapy Method of administration: IV Push or per SNF protocol Method of administration may be changed at the discretion of home infusion pharmacist based upon assessment of the patient and/or caregiver's ability to self-administer the medication ordered. 10/05/23 11/14/23  Maree Bracken D, DO  cyanocobalamin  (V-R VITAMIN B-12) 500 MCG tablet Take 1 tablet (500 mcg total) by mouth every Monday, Wednesday, and Friday. 06/12/23   Lamon Pleasant HERO, PA-C  ezetimibe  (ZETIA ) 10 MG tablet Take 1 tablet (10 mg total) by mouth daily. 11/11/19   Dettinger, Fonda LABOR, MD  fluticasone  (FLONASE ) 50 MCG/ACT nasal spray USE 2 SPRAYS IN EACH NOSTRIL EVERY DAY AS NEEDED 01/15/20   Dettinger, Fonda LABOR, MD  folic acid  (FOLVITE ) 800 MCG tablet Take 1 tablet (800 mcg total) by mouth daily. 06/12/23   Lamon Pleasant HERO, PA-C  Heparin  Sod, Pork, Lock Flush (HEPARIN  LOCK FLUSH IV) Inject 3 mLs into the vein every 8 (eight) hours. Use 3 mL intravenously every 8  hours for flush of non-valved catheter lumens after med admin. Flush with 10 mL of normal saline before med; 10 mL normal saline after med; followed by heparin .    [provider]  hydrocortisone  (CORTEF ) 20 MG tablet Take 1 tablet (20 mg total) by mouth 2 (two) times daily. 10/05/23 11/04/23  Maree, Pratik D, DO  lactulose  (CONSTULOSE ) 10 GM/15ML solution TAKE 30 ML BY MOUTH TWICE DAILY Patient taking differently: Take 45 mLs by mouth 3 (three) times daily. 10/24/22   Carlan, Mitzie CROME, NP  levothyroxine  (SYNTHROID ) 125 MCG  tablet Take 1 tablet (125 mcg total) by mouth daily at 6 (six) AM. 10/06/23 11/05/23  Maree, Pratik D, DO  metoprolol  succinate (TOPROL -XL) 100 MG 24 hr tablet Take 0.5 tablets (50 mg total) by mouth in the morning and at bedtime. 10/16/23   Darci Pore, MD  midodrine (PROAMATINE) 5 MG tablet Take 5 mg by mouth 2 (two) times daily with a meal.    [provider]  Nutritional Supplements (,FEEDING SUPPLEMENT, PROSOURCE PLUS) liquid Take 30 mLs by mouth 3 (three) times daily between meals. 10/16/23   Darci Pore, MD  OneTouch Delica Lancets 30G MISC Test BS QID Dx E11.9 05/27/19   Dettinger, Fonda LABOR, MD  pantoprazole  (PROTONIX ) 40 MG tablet TAKE 1 TABLET BY MOUTH TWICE DAILY 09/04/23   Castaneda Mayorga, Toribio, MD  potassium chloride  SA (KLOR-CON  M) 20 MEQ tablet Take 20 mEq by mouth daily.    [provider]  rosuvastatin (CRESTOR) 5 MG tablet Take 5 mg by mouth daily.    [provider]  Sodium Chloride  Flush (NORMAL SALINE FLUSH IV) Inject 10 mLs into the vein every 8 (eight) hours.    [provider]  spironolactone  (ALDACTONE ) 50 MG tablet Take 1 tablet (50 mg total) by mouth daily. 09/12/21   Lavona Agent, MD  thiamine  (VITAMIN B1) 100 MG tablet Take 100 mg by mouth daily.    [provider]  torsemide  (DEMADEX ) 20 MG tablet Take 20 mg by mouth daily.    [provider]  ZINC OXIDE, TOPICAL, 10 % CREA Apply 1 Application topically in the morning and at bedtime. Apply to reddened area to groin/scrotum for MASD Patient not taking: Reported on 10/10/2023    [provider]    Allergies: Allopurinol  and Fluoxetine hcl    Review of Systems  All other systems reviewed and are negative.   Updated Vital Signs BP 137/77 (BP Location: Left Arm)   Pulse 64   Temp (!) 96.8 F (36 C) (Axillary)   Resp (!) 22   SpO2 97%   Physical Exam Vitals and nursing note reviewed.  Constitutional:      General: He is in acute  distress.     Appearance: He is well-developed. He is ill-appearing.     Comments: Somnolent, right sided gaze preference  HENT:     Head: Normocephalic and atraumatic.     Mouth/Throat:     Mouth: Mucous membranes are dry.     Pharynx: No oropharyngeal exudate.  Eyes:     General: Scleral icterus present.        Right eye: No discharge.        Left eye: No discharge.     Conjunctiva/sclera: Conjunctivae normal.     Comments: Right sided gaze preference, slight jaundice  Neck:     Thyroid : No thyromegaly.     Vascular: No JVD.  Cardiovascular:     Rate and Rhythm: Normal rate  and regular rhythm.     Heart sounds: Normal heart sounds. No murmur heard.    No friction rub. No gallop.  Pulmonary:     Effort: Pulmonary effort is normal. No respiratory distress.     Breath sounds: Normal breath sounds. No wheezing or rales.  Abdominal:     General: Bowel sounds are normal. There is no distension.     Palpations: Abdomen is soft. There is no mass.     Tenderness: There is no abdominal tenderness.  Musculoskeletal:     Cervical back: Normal range of motion and neck supple.     Right lower leg: Edema present.     Left lower leg: Edema present.     Comments: Anasarca onto the abdominal wall, there is a reducible abdominal wall hernia  Lymphadenopathy:     Cervical: No cervical adenopathy.  Skin:    General: Skin is warm and dry.     Findings: No erythema or rash.  Neurological:     Comments: The patient is flailing on the bed moving all 4 extremities erratically, he does localize to pain, he grunts, he will not talk  Psychiatric:        Behavior: Behavior normal.     (all labs ordered are listed, but only abnormal results are displayed) Labs Reviewed  AMMONIA - Abnormal; Notable for the following components:      Result Value   Ammonia 247 (*)    All other components within normal limits  COMPREHENSIVE METABOLIC PANEL WITH GFR - Abnormal; Notable for the following components:    Sodium 131 (*)    CO2 19 (*)    BUN 31 (*)    Creatinine, Ser 1.89 (*)    Total Protein 6.0 (*)    Albumin  2.6 (*)    AST 63 (*)    Alkaline Phosphatase 168 (*)    Total Bilirubin 3.6 (*)    GFR, Estimated 40 (*)    All other components within normal limits  CBC WITH DIFFERENTIAL/PLATELET - Abnormal; Notable for the following components:   RBC 3.64 (*)    Hemoglobin 11.3 (*)    HCT 33.6 (*)    RDW 20.1 (*)    Platelets 38 (*)    All other components within normal limits  FOLATE - Abnormal; Notable for the following components:   Folate 5.9 (*)    All other components within normal limits  PROTIME-INR - Abnormal; Notable for the following components:   Prothrombin Time 26.7 (*)    INR 2.3 (*)    All other components within normal limits  APTT - Abnormal; Notable for the following components:   aPTT 75 (*)    All other components within normal limits  MAGNESIUM   ETHANOL  URINALYSIS, ROUTINE W REFLEX MICROSCOPIC  VITAMIN B1  TSH  VITAMIN B12  URINE DRUG SCREEN  I-STAT CHEM 8, ED    EKG: None  Radiology: CT HEAD CODE STROKE WO CONTRAST` Result Date: 10/19/2023 EXAM: CT HEAD WITHOUT CONTRAST 10/19/2023 10:57:10 PM TECHNIQUE: CT of the head was performed without the administration of intravenous contrast. Automated exposure control, iterative reconstruction, and/or weight based adjustment of the mA/kV was utilized to reduce the radiation dose to as low as reasonably achievable. COMPARISON: None available. CLINICAL HISTORY: Altered mental status, nontraumatic (Ped 0-17y). ams and right gaze. FINDINGS: Motion limited study.  Within this limiation: BRAIN AND VENTRICLES: No acute hemorrhage. No evidence of acute infarct. No hydrocephalus. No extra-axial collection. No mass effect or  midline shift. ORBITS: No acute abnormality. SINUSES: No acute abnormality. SOFT TISSUES AND SKULL: No acute soft tissue abnormality. No skull fracture. IMPRESSION: 1. No acute intracranial  abnormality. Electronically signed by: Gilmore Molt MD 10/19/2023 11:06 PM EDT RP Workstation: [id number]     .Critical Care  Performed by: Cleotilde Rogue, MD Authorized by: Cleotilde Rogue, MD   Critical care provider statement:    Critical care time (minutes):  75   Critical care time was exclusive of:  Separately billable procedures and treating other patients and teaching time   Critical care was necessary to treat or prevent imminent or life-threatening deterioration of the following conditions:  CNS failure or compromise and endocrine crisis   Critical care was time spent personally by me on the following activities:  Development of treatment plan with patient or surrogate, discussions with consultants, evaluation of patient's response to treatment, examination of patient, obtaining history from patient or surrogate, review of old charts, re-evaluation of patient's condition, pulse oximetry, ordering and review of radiographic studies, ordering and review of laboratory studies and ordering and performing treatments and interventions   Care discussed with: admitting provider      Medications Ordered in the ED  lactulose  (CHRONULAC ) enema 200 gm (has no administration in time range)  iohexol  (OMNIPAQUE ) 350 MG/ML injection 60 mL (has no administration in time range)  levETIRAcetam (KEPPRA) undiluted injection 1,500 mg (1,500 mg Intravenous Given 10/19/23 2239)    Clinical Course as of 10/19/23 2330  Sat Oct 19, 2023  2315 Dr. Glynda - with telespecialist - states that she saw him - has gaze to the R, less R arm movement, not candidate for thrombolytic.  Held off on angio b/c of movement - and renal insufficiency - they recommend no interventoin - they don't want the CTA - b/c he wouldn't be good candidate for intervention / thrombectomy  and should be admitted.   [BM]    Clinical Course User Index [BM] Cleotilde Rogue, MD                                 Medical Decision  Making Amount and/or Complexity of Data Reviewed Labs: ordered. Radiology: ordered.  Risk Prescription drug management. Decision regarding hospitalization.    This patient presents to the ED for concern of altered mental status, this involves an extensive number of treatment options, and is a complaint that carries with it a high risk of complications and morbidity.  The differential diagnosis includes hepatic encephalopathy, stroke, seizure Evidently last seen normal at 7 AM   Co morbidities / Chronic conditions that complicate the patient evaluation  Chronic cirrhosis, chronic debilitated state   Additional history obtained:  Additional history obtained from EMR External records from outside source obtained and reviewed including prior discharge summaries, see above   Lab Tests:  I Ordered, and personally interpreted labs.  The pertinent results include: Severe elevation in his ammonia, 247.  Creatinine is up to 1.89 with a baseline closer to 1.26, electrolytes are otherwise fairly unremarkable, albumin  is low, bilirubin is 3.6, magnesium  is normal   Imaging Studies ordered:  I ordered imaging studies including CT head as well as angiogram of the head and neck I independently visualized and interpreted imaging which showed no acute ischemic findings on the brain CT I agree with the radiologist interpretation   Cardiac Monitoring: / EKG:  The patient was maintained on a cardiac monitor.  I personally  viewed and interpreted the cardiac monitored which showed an underlying rhythm of: Normal sinus rhythm   Problem List / ED Course / Critical interventions / Medication management  This patient has what appears to be a severe encephalopathy but also with some focal findings including right sided deviation of the gaze, this raises concern for possible focal seizure or stroke, I have discussed the care with the neurologist, may be a candidate for thrombectomy if within 24 hours  which it appears she is on timing, will obtain CT angiogram, if negative will need to be transferred ED to ED with loading with Keppra for neurology evaluation and hospitalist admission I ordered medication including Keppra, rectal lactulose  enema Reevaluation of the patient after these medicines showed that the patient no significant improvement I have reviewed the patients home medicines and have made adjustments as needed   Consultations Obtained:  I requested consultation with the neurologist Dr. Charles, received a callback at about 10:25 PM,  and discussed lab and imaging findings as well as pertinent plan - they recommend: Likely needs transfer ED to ED after CT angio done, this could be focal seizures, this would be unusual related to hepatic encephalopathy which would generally cause tonic-clonic seizures if it was going to cause an acute neurologic abnormality, hospitalist to admit, if LVO positive will activate code stroke` Unfortunately patient will not tolerate CT angio, will not tolerate this because of creatinine and will not sit still, neurology request that we do not perform this and rather admit the patient, loaded with Keppra Notes from nursing facility when I spoke with [facility] by phone, they state he was last seen normal at 7 AM this morning and has had a progressive decline throughout the day   Social Determinants of Health:  Nursing home patient   Test / Admission - Considered:  Admit - critically ill      Final diagnoses:  Hepatic encephalopathy (HCC)  Sustained horizontal conjugate gaze deviation    ED Discharge Orders     None          Cleotilde Rogue, MD 10/19/23 2330

## 2023-10-20 ENCOUNTER — Inpatient Hospital Stay (HOSPITAL_COMMUNITY)

## 2023-10-20 DIAGNOSIS — J440 Chronic obstructive pulmonary disease with acute lower respiratory infection: Secondary | ICD-10-CM | POA: Diagnosis not present

## 2023-10-20 DIAGNOSIS — E039 Hypothyroidism, unspecified: Secondary | ICD-10-CM | POA: Diagnosis not present

## 2023-10-20 DIAGNOSIS — E8721 Acute metabolic acidosis: Secondary | ICD-10-CM | POA: Diagnosis not present

## 2023-10-20 DIAGNOSIS — K7682 Hepatic encephalopathy: Secondary | ICD-10-CM | POA: Diagnosis present

## 2023-10-20 DIAGNOSIS — E038 Other specified hypothyroidism: Secondary | ICD-10-CM

## 2023-10-20 DIAGNOSIS — K7460 Unspecified cirrhosis of liver: Secondary | ICD-10-CM

## 2023-10-20 DIAGNOSIS — N179 Acute kidney failure, unspecified: Secondary | ICD-10-CM | POA: Diagnosis not present

## 2023-10-20 DIAGNOSIS — L89514 Pressure ulcer of right ankle, stage 4: Secondary | ICD-10-CM | POA: Insufficient documentation

## 2023-10-20 DIAGNOSIS — I5030 Unspecified diastolic (congestive) heart failure: Secondary | ICD-10-CM | POA: Diagnosis not present

## 2023-10-20 DIAGNOSIS — F05 Delirium due to known physiological condition: Secondary | ICD-10-CM | POA: Diagnosis present

## 2023-10-20 DIAGNOSIS — I959 Hypotension, unspecified: Secondary | ICD-10-CM | POA: Diagnosis not present

## 2023-10-20 DIAGNOSIS — Z7401 Bed confinement status: Secondary | ICD-10-CM | POA: Diagnosis not present

## 2023-10-20 DIAGNOSIS — I81 Portal vein thrombosis: Secondary | ICD-10-CM | POA: Diagnosis present

## 2023-10-20 DIAGNOSIS — E7220 Disorder of urea cycle metabolism, unspecified: Secondary | ICD-10-CM | POA: Diagnosis not present

## 2023-10-20 DIAGNOSIS — K7030 Alcoholic cirrhosis of liver without ascites: Secondary | ICD-10-CM | POA: Diagnosis not present

## 2023-10-20 DIAGNOSIS — K922 Gastrointestinal hemorrhage, unspecified: Secondary | ICD-10-CM

## 2023-10-20 DIAGNOSIS — D696 Thrombocytopenia, unspecified: Secondary | ICD-10-CM

## 2023-10-20 DIAGNOSIS — L89313 Pressure ulcer of right buttock, stage 3: Secondary | ICD-10-CM | POA: Diagnosis present

## 2023-10-20 DIAGNOSIS — D689 Coagulation defect, unspecified: Secondary | ICD-10-CM | POA: Insufficient documentation

## 2023-10-20 DIAGNOSIS — L89151 Pressure ulcer of sacral region, stage 1: Secondary | ICD-10-CM | POA: Diagnosis present

## 2023-10-20 DIAGNOSIS — I480 Paroxysmal atrial fibrillation: Secondary | ICD-10-CM | POA: Diagnosis not present

## 2023-10-20 DIAGNOSIS — R29818 Other symptoms and signs involving the nervous system: Secondary | ICD-10-CM | POA: Diagnosis not present

## 2023-10-20 DIAGNOSIS — Z789 Other specified health status: Secondary | ICD-10-CM | POA: Diagnosis not present

## 2023-10-20 DIAGNOSIS — H510 Palsy (spasm) of conjugate gaze: Secondary | ICD-10-CM | POA: Diagnosis not present

## 2023-10-20 DIAGNOSIS — A4152 Sepsis due to Pseudomonas: Secondary | ICD-10-CM | POA: Diagnosis present

## 2023-10-20 DIAGNOSIS — I330 Acute and subacute infective endocarditis: Secondary | ICD-10-CM | POA: Insufficient documentation

## 2023-10-20 DIAGNOSIS — R404 Transient alteration of awareness: Secondary | ICD-10-CM | POA: Diagnosis not present

## 2023-10-20 DIAGNOSIS — Z7189 Other specified counseling: Secondary | ICD-10-CM | POA: Diagnosis not present

## 2023-10-20 DIAGNOSIS — R569 Unspecified convulsions: Secondary | ICD-10-CM | POA: Diagnosis not present

## 2023-10-20 DIAGNOSIS — I6523 Occlusion and stenosis of bilateral carotid arteries: Secondary | ICD-10-CM | POA: Diagnosis not present

## 2023-10-20 DIAGNOSIS — Z515 Encounter for palliative care: Secondary | ICD-10-CM | POA: Diagnosis not present

## 2023-10-20 DIAGNOSIS — R4182 Altered mental status, unspecified: Secondary | ICD-10-CM | POA: Diagnosis not present

## 2023-10-20 DIAGNOSIS — R6521 Severe sepsis with septic shock: Secondary | ICD-10-CM | POA: Diagnosis not present

## 2023-10-20 DIAGNOSIS — G9341 Metabolic encephalopathy: Secondary | ICD-10-CM | POA: Diagnosis present

## 2023-10-20 DIAGNOSIS — Z6841 Body Mass Index (BMI) 40.0 and over, adult: Secondary | ICD-10-CM | POA: Diagnosis not present

## 2023-10-20 DIAGNOSIS — I110 Hypertensive heart disease with heart failure: Secondary | ICD-10-CM | POA: Diagnosis present

## 2023-10-20 DIAGNOSIS — R4020 Unspecified coma: Secondary | ICD-10-CM | POA: Diagnosis present

## 2023-10-20 DIAGNOSIS — L89323 Pressure ulcer of left buttock, stage 3: Secondary | ICD-10-CM | POA: Diagnosis present

## 2023-10-20 DIAGNOSIS — I6501 Occlusion and stenosis of right vertebral artery: Secondary | ICD-10-CM | POA: Diagnosis not present

## 2023-10-20 DIAGNOSIS — E2740 Unspecified adrenocortical insufficiency: Secondary | ICD-10-CM | POA: Diagnosis not present

## 2023-10-20 DIAGNOSIS — B9561 Methicillin susceptible Staphylococcus aureus infection as the cause of diseases classified elsewhere: Secondary | ICD-10-CM | POA: Diagnosis present

## 2023-10-20 DIAGNOSIS — J151 Pneumonia due to Pseudomonas: Secondary | ICD-10-CM | POA: Diagnosis not present

## 2023-10-20 DIAGNOSIS — I8510 Secondary esophageal varices without bleeding: Secondary | ICD-10-CM | POA: Diagnosis present

## 2023-10-20 DIAGNOSIS — Z66 Do not resuscitate: Secondary | ICD-10-CM | POA: Diagnosis not present

## 2023-10-20 DIAGNOSIS — D6959 Other secondary thrombocytopenia: Secondary | ICD-10-CM | POA: Diagnosis present

## 2023-10-20 DIAGNOSIS — E874 Mixed disorder of acid-base balance: Secondary | ICD-10-CM | POA: Diagnosis not present

## 2023-10-20 DIAGNOSIS — Z0389 Encounter for observation for other suspected diseases and conditions ruled out: Secondary | ICD-10-CM | POA: Diagnosis not present

## 2023-10-20 LAB — HEPATITIS PANEL, ACUTE
HCV Ab: NONREACTIVE
Hep A IgM: NONREACTIVE
Hep B C IgM: NONREACTIVE
Hepatitis B Surface Ag: NONREACTIVE

## 2023-10-20 LAB — CBC WITH DIFFERENTIAL/PLATELET
Abs Immature Granulocytes: 0.04 K/uL (ref 0.00–0.07)
Basophils Absolute: 0 K/uL (ref 0.0–0.1)
Basophils Relative: 0 %
Eosinophils Absolute: 0 K/uL (ref 0.0–0.5)
Eosinophils Relative: 0 %
HCT: 35.2 % — ABNORMAL LOW (ref 39.0–52.0)
Hemoglobin: 12.1 g/dL — ABNORMAL LOW (ref 13.0–17.0)
Immature Granulocytes: 1 %
Lymphocytes Relative: 5 %
Lymphs Abs: 0.4 K/uL — ABNORMAL LOW (ref 0.7–4.0)
MCH: 31.5 pg (ref 26.0–34.0)
MCHC: 34.4 g/dL (ref 30.0–36.0)
MCV: 91.7 fL (ref 80.0–100.0)
Monocytes Absolute: 0.2 K/uL (ref 0.1–1.0)
Monocytes Relative: 3 %
Neutro Abs: 7.4 K/uL (ref 1.7–7.7)
Neutrophils Relative %: 91 %
Platelets: 31 K/uL — ABNORMAL LOW (ref 150–400)
RBC: 3.84 MIL/uL — ABNORMAL LOW (ref 4.22–5.81)
RDW: 20.2 % — ABNORMAL HIGH (ref 11.5–15.5)
WBC: 8.2 K/uL (ref 4.0–10.5)
nRBC: 0 % (ref 0.0–0.2)

## 2023-10-20 LAB — COMPREHENSIVE METABOLIC PANEL WITH GFR
ALT: <5 U/L (ref 0–44)
ALT: 6 U/L (ref 0–44)
AST: 57 U/L — ABNORMAL HIGH (ref 15–41)
AST: 62 U/L — ABNORMAL HIGH (ref 15–41)
Albumin: 2 g/dL — ABNORMAL LOW (ref 3.5–5.0)
Albumin: 2.2 g/dL — ABNORMAL LOW (ref 3.5–5.0)
Alkaline Phosphatase: 143 U/L — ABNORMAL HIGH (ref 38–126)
Alkaline Phosphatase: 152 U/L — ABNORMAL HIGH (ref 38–126)
Anion gap: 16 — ABNORMAL HIGH (ref 5–15)
Anion gap: 12 (ref 5–15)
BUN: 32 mg/dL — ABNORMAL HIGH (ref 6–20)
BUN: 34 mg/dL — ABNORMAL HIGH (ref 6–20)
CO2: 16 mmol/L — ABNORMAL LOW (ref 22–32)
CO2: 16 mmol/L — ABNORMAL LOW (ref 22–32)
Calcium: 8.4 mg/dL — ABNORMAL LOW (ref 8.9–10.3)
Calcium: 8.7 mg/dL — ABNORMAL LOW (ref 8.9–10.3)
Chloride: 100 mmol/L (ref 98–111)
Chloride: 101 mmol/L (ref 98–111)
Creatinine, Ser: 1.81 mg/dL — ABNORMAL HIGH (ref 0.61–1.24)
Creatinine, Ser: 1.91 mg/dL — ABNORMAL HIGH (ref 0.61–1.24)
GFR, Estimated: 42 mL/min — ABNORMAL LOW (ref >60)
GFR, Estimated: 40 mL/min — ABNORMAL LOW (ref >60)
Glucose, Bld: 92 mg/dL (ref 70–99)
Glucose, Bld: 94 mg/dL (ref 70–99)
Potassium: 4.4 mmol/L (ref 3.5–5.1)
Potassium: 5.2 mmol/L — ABNORMAL HIGH (ref 3.5–5.1)
Sodium: 132 mmol/L — ABNORMAL LOW (ref 135–145)
Sodium: 129 mmol/L — ABNORMAL LOW (ref 135–145)
Total Bilirubin: 4.3 mg/dL — ABNORMAL HIGH (ref 0.0–1.2)
Total Bilirubin: 4.7 mg/dL — ABNORMAL HIGH (ref 0.0–1.2)
Total Protein: 5.7 g/dL — ABNORMAL LOW (ref 6.5–8.1)
Total Protein: 6.3 g/dL — ABNORMAL LOW (ref 6.5–8.1)

## 2023-10-20 LAB — PROTIME-INR
INR: 2.3 — ABNORMAL HIGH (ref 0.8–1.2)
Prothrombin Time: 26.3 s — ABNORMAL HIGH (ref 11.4–15.2)

## 2023-10-20 LAB — BLOOD GAS, ARTERIAL
Acid-base deficit: 3.7 mmol/L — ABNORMAL HIGH (ref 0.0–2.0)
Bicarbonate: 16.7 mmol/L — ABNORMAL LOW (ref 20.0–28.0)
O2 Saturation: 99.4 %
Patient temperature: 36.4
pCO2 arterial: 19 mmHg — CL (ref 32–48)
pH, Arterial: 7.54 — ABNORMAL HIGH (ref 7.35–7.45)
pO2, Arterial: 93 mmHg (ref 83–108)

## 2023-10-20 LAB — CBC
HCT: 32.3 % — ABNORMAL LOW (ref 39.0–52.0)
Hemoglobin: 11.1 g/dL — ABNORMAL LOW (ref 13.0–17.0)
MCH: 31.3 pg (ref 26.0–34.0)
MCHC: 34.4 g/dL (ref 30.0–36.0)
MCV: 91 fL (ref 80.0–100.0)
Platelets: 29 K/uL — CL (ref 150–400)
RBC: 3.55 MIL/uL — ABNORMAL LOW (ref 4.22–5.81)
RDW: 20.1 % — ABNORMAL HIGH (ref 11.5–15.5)
WBC: 5.7 K/uL (ref 4.0–10.5)
nRBC: 0 % (ref 0.0–0.2)

## 2023-10-20 LAB — BLOOD GAS, VENOUS
Acid-base deficit: 4.4 mmol/L — ABNORMAL HIGH (ref 0.0–2.0)
Bicarbonate: 17.5 mmol/L — ABNORMAL LOW (ref 20.0–28.0)
O2 Saturation: 95 %
Patient temperature: 36.4
pCO2, Ven: 23 mmHg — ABNORMAL LOW (ref 44–60)
pH, Ven: 7.48 — ABNORMAL HIGH (ref 7.25–7.43)
pO2, Ven: 61 mmHg — ABNORMAL HIGH (ref 32–45)

## 2023-10-20 LAB — LACTIC ACID, PLASMA
Lactic Acid, Venous: 2.5 mmol/L (ref 0.5–1.9)
Lactic Acid, Venous: 2.8 mmol/L (ref 0.5–1.9)

## 2023-10-20 LAB — ACETAMINOPHEN LEVEL: Acetaminophen (Tylenol), Serum: <10 ug/mL — ABNORMAL LOW (ref 10–30)

## 2023-10-20 LAB — AMMONIA: Ammonia: 172 umol/L — ABNORMAL HIGH (ref 9–35)

## 2023-10-20 LAB — SALICYLATE LEVEL: Salicylate Lvl: <7 mg/dL — ABNORMAL LOW (ref 7.0–30.0)

## 2023-10-20 LAB — MRSA NEXT GEN BY PCR, NASAL: MRSA by PCR Next Gen: DETECTED — AB

## 2023-10-20 LAB — T4, FREE: Free T4: 0.8 ng/dL (ref 0.61–1.12)

## 2023-10-20 LAB — VITAMIN A: Vitamin A (Retinoic Acid): 7.7 ug/dL — ABNORMAL LOW (ref 22.0–69.5)

## 2023-10-20 MED ORDER — CEFAZOLIN SODIUM-DEXTROSE 2-4 GM/100ML-% IV SOLN
2.0000 g | Freq: Three times a day (TID) | INTRAVENOUS | Status: DC
  Administered 2023-10-20 – 2023-10-21 (×5): 2 g via INTRAVENOUS
  Filled 2023-10-20 (×5): qty 100

## 2023-10-20 MED ORDER — ORAL CARE MOUTH RINSE
15.0000 mL | OROMUCOSAL | Status: DC | PRN
  Administered 2023-10-20: 15 mL via OROMUCOSAL

## 2023-10-20 MED ORDER — GERHARDT'S BUTT CREAM
TOPICAL_CREAM | Freq: Three times a day (TID) | CUTANEOUS | Status: DC
  Administered 2023-10-21 – 2023-10-25 (×5): 1 via TOPICAL
  Filled 2023-10-20 (×6): qty 60

## 2023-10-20 MED ORDER — SODIUM CHLORIDE 0.9 % IV SOLN: INTRAVENOUS | Status: AC | PRN

## 2023-10-20 MED ORDER — COLLAGENASE 250 UNIT/GM EX OINT
TOPICAL_OINTMENT | Freq: Every day | CUTANEOUS | Status: DC
  Administered 2023-10-25 – 2023-10-30 (×2): 1 via TOPICAL
  Filled 2023-10-20 (×4): qty 30

## 2023-10-20 MED ORDER — LEVOTHYROXINE SODIUM 100 MCG/5ML IV SOLN
95.0000 ug | Freq: Every day | INTRAVENOUS | Status: DC
Start: 2023-10-20 — End: 2023-10-21
  Administered 2023-10-21: 95 ug via INTRAVENOUS
  Filled 2023-10-20: qty 5

## 2023-10-20 MED ORDER — ACETAMINOPHEN 650 MG RE SUPP
650.0000 mg | Freq: Four times a day (QID) | RECTAL | Status: DC | PRN
Start: 2023-10-20 — End: 2023-10-23

## 2023-10-20 MED ORDER — LACTULOSE ENEMA
300.0000 mL | ORAL | Status: DC
  Administered 2023-10-21 (×5): 300 mL via RECTAL
  Filled 2023-10-20 (×9): qty 300

## 2023-10-20 MED ORDER — METRONIDAZOLE 500 MG/100ML IV SOLN
500.0000 mg | Freq: Two times a day (BID) | INTRAVENOUS | Status: DC
  Administered 2023-10-20 – 2023-10-21 (×2): 500 mg via INTRAVENOUS
  Filled 2023-10-20 (×2): qty 100

## 2023-10-20 MED ORDER — ALBUTEROL SULFATE HFA 108 (90 BASE) MCG/ACT IN AERS: 2.0000 | INHALATION_SPRAY | RESPIRATORY_TRACT | Status: DC | PRN

## 2023-10-20 MED ORDER — ACETAMINOPHEN 325 MG PO TABS
650.0000 mg | ORAL_TABLET | Freq: Four times a day (QID) | ORAL | Status: DC | PRN
  Filled 2023-10-20: qty 2

## 2023-10-20 MED ORDER — HYDROCERIN EX CREA
TOPICAL_CREAM | Freq: Two times a day (BID) | CUTANEOUS | Status: DC
  Administered 2023-10-20 – 2023-10-27 (×6): 1 via TOPICAL
  Filled 2023-10-20 (×3): qty 113

## 2023-10-20 MED ORDER — HYDROCORTISONE SOD SUC (PF) 100 MG IJ SOLR
20.0000 mg | Freq: Two times a day (BID) | INTRAMUSCULAR | Status: DC
  Administered 2023-10-20 – 2023-10-22 (×5): 20 mg via INTRAVENOUS
  Filled 2023-10-20 (×5): qty 2
  Filled 2023-10-20: qty 0.4

## 2023-10-20 MED ORDER — CHLORHEXIDINE GLUCONATE CLOTH 2 % EX PADS
6.0000 | MEDICATED_PAD | Freq: Every day | CUTANEOUS | Status: DC
  Administered 2023-10-20 – 2023-10-25 (×6): 6 via TOPICAL

## 2023-10-20 MED ORDER — SODIUM CHLORIDE 0.9 % IV SOLN
1.0000 mg | Freq: Once | INTRAVENOUS | Status: AC
  Administered 2023-10-20: 1 mg via INTRAVENOUS
  Filled 2023-10-20 (×2): qty 0.2

## 2023-10-20 MED ORDER — SODIUM CHLORIDE 0.9 % IV SOLN: 1.0000 mg | Freq: Once | INTRAVENOUS | Status: DC

## 2023-10-20 MED ORDER — LEVETIRACETAM (KEPPRA) 500 MG/5 ML ADULT IV PUSH
500.0000 mg | Freq: Two times a day (BID) | INTRAVENOUS | Status: DC
  Administered 2023-10-20 – 2023-10-23 (×7): 500 mg via INTRAVENOUS
  Filled 2023-10-20 (×7): qty 5

## 2023-10-20 MED ORDER — MUPIROCIN 2 % EX OINT
1.0000 | TOPICAL_OINTMENT | Freq: Two times a day (BID) | CUTANEOUS | Status: AC
  Administered 2023-10-21 – 2023-10-25 (×10): 1 via NASAL
  Filled 2023-10-20 (×2): qty 22

## 2023-10-20 MED ORDER — PANTOPRAZOLE SODIUM 40 MG IV SOLR
40.0000 mg | Freq: Two times a day (BID) | INTRAVENOUS | Status: DC
  Administered 2023-10-20 – 2023-10-26 (×14): 40 mg via INTRAVENOUS
  Filled 2023-10-20 (×14): qty 10

## 2023-10-20 MED ORDER — ALBUTEROL SULFATE (2.5 MG/3ML) 0.083% IN NEBU: 2.5000 mg | INHALATION_SOLUTION | RESPIRATORY_TRACT | Status: DC | PRN

## 2023-10-20 MED ORDER — THIAMINE HCL 100 MG/ML IJ SOLN
250.0000 mg | Freq: Every day | INTRAVENOUS | Status: DC
  Administered 2023-10-22 – 2023-10-25 (×4): 250 mg via INTRAVENOUS
  Filled 2023-10-20 (×6): qty 2.5

## 2023-10-20 MED ORDER — LACTULOSE ENEMA
300.0000 mL | Freq: Three times a day (TID) | ORAL | Status: DC
  Administered 2023-10-20: 300 mL via RECTAL
  Filled 2023-10-20 (×3): qty 300

## 2023-10-20 MED ORDER — IOHEXOL 350 MG/ML SOLN
75.0000 mL | Freq: Once | INTRAVENOUS | Status: AC | PRN
  Administered 2023-10-20: 75 mL via INTRAVENOUS

## 2023-10-20 MED ORDER — LACTATED RINGERS IV BOLUS
500.0000 mL | Freq: Once | INTRAVENOUS | Status: AC
  Administered 2023-10-20: 500 mL via INTRAVENOUS

## 2023-10-20 MED ORDER — POLYETHYLENE GLYCOL 3350 17 G PO PACK: 17.0000 g | PACK | Freq: Every day | ORAL | Status: DC | PRN

## 2023-10-20 MED ORDER — THIAMINE HCL 100 MG/ML IJ SOLN: 100.0000 mg | Freq: Every day | INTRAMUSCULAR | Status: DC

## 2023-10-20 MED ORDER — THIAMINE HCL 100 MG/ML IJ SOLN
500.0000 mg | Freq: Three times a day (TID) | INTRAVENOUS | Status: AC
Start: 2023-10-20 — End: 2023-10-22
  Administered 2023-10-20 – 2023-10-21 (×6): 500 mg via INTRAVENOUS
  Filled 2023-10-20: qty 500
  Filled 2023-10-20 (×3): qty 5
  Filled 2023-10-20: qty 500
  Filled 2023-10-20: qty 5

## 2023-10-20 MED ORDER — LACTATED RINGERS IV SOLN
INTRAVENOUS | Status: AC
Start: 2023-10-20 — End: 2023-10-21

## 2023-10-20 MED ORDER — LACTATED RINGERS IV SOLN: INTRAVENOUS | Status: AC

## 2023-10-20 MED ORDER — CYANOCOBALAMIN 1000 MCG/ML IJ SOLN
1000.0000 ug | INTRAMUSCULAR | Status: DC
  Administered 2023-10-21: 1000 ug via INTRAMUSCULAR
  Filled 2023-10-20: qty 1

## 2023-10-20 MED ORDER — FLUTICASONE PROPIONATE 50 MCG/ACT NA SUSP: 2.0000 | Freq: Every day | NASAL | Status: DC | PRN

## 2023-10-20 MED ORDER — CEFAZOLIN IV (FOR PTA / DISCHARGE USE ONLY): 2.0000 g | Freq: Three times a day (TID) | INTRAVENOUS | Status: DC

## 2023-10-20 NOTE — Progress Notes (Signed)
 EEG equipment transferred to 4E17 with patient. [facility] now monitoring.

## 2023-10-20 NOTE — ED Notes (Signed)
 Pt opens eyes to voice and painful stimuli but does not follow commands, became very irritated biting down and shaking his head when attempting oral temp , that was the most reaction this RN received during the shift

## 2023-10-20 NOTE — Progress Notes (Signed)
 Pt transferred to CT department with a staff nurse. His vital remains stable.   Wendi Dash, RN

## 2023-10-20 NOTE — Assessment & Plan Note (Signed)
  History of chronic thrombocytopenia and coagulopathy with elevated INR due to advanced liver disease Patient is at extremely high risk of bleeding complications and therefore anticoagulation is being avoided Monitoring with serial CBCs and coagulation panels

## 2023-10-20 NOTE — Progress Notes (Signed)
 PT arrived to 4E, completely obtunded, Vitals taken stable, critical care at bedside working on ICU transfer  Laneta JAYSON Rao, RN] 10/20/2023 6:52 PM

## 2023-10-20 NOTE — H&P (Addendum)
 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    Patient is unable to provide a history due to near obtundation.  Majority of the history is been obtained from discussions with emergency department staff and review of prior records.  Of note, patient was recently hospitalized at [facility] from 10/2 until 10/8 for an acute bout of hepatic encephalopathy complicated by acute kidney injury.  Patient was managed with lactulose  and temporarily with holding his usual diuretics Aldactone  and torsemide .  Patient was noted by his skilled nursing facility that he was developing worsening confusion and lethargy for approximate 24 hours before EMS was contacted and patient was brought into [facility] emergency department for further evaluation.  Upon evaluation in the emergency department patient was found to be minimally responsive with an ammonia level of 247, noted to be a stark elevation compared to 44 four days prior.  Patient was also noted to have a right gaze preference with occasional spontaneous movement of all 4 extremities.  Case was evaluated by both teleneurology and Dr. Vanessa with allergy at [facility].  They recommended initiation of  Keppra and transferred to [facility] for neurology consultation and EEG monitoring.  Hospitalist group was then called to assess the patient for admission in the hospital.   Review of Systems: Review of Systems  Unable to perform ROS: Mental status change     Past Medical History:  Diagnosis Date   Anxiety    Atrial fibrillation (HCC)    Blood clot in vein    blood clot in portal vein   CHF (congestive heart failure) (HCC)    Cirrhosis (HCC)    NASH   Constipation    COPD (chronic obstructive pulmonary disease) (HCC)    Dysrhythmia    GERD (gastroesophageal reflux disease)    Gout    Leukopenia 07/08/2015   Neuromuscular disorder (HCC)    neuropathy in hands and feet   Psoriasis    RA (rheumatoid arthritis) (HCC)    Sleep apnea    cpap used- level 10 and greater   Thrombocytopenia     Past Surgical History:  Procedure Laterality Date   ANKLE SURGERY     right ankle talor repair   CHOLECYSTECTOMY  sept 2016   CHOLECYSTECTOMY     COLONOSCOPY  05/08/2011   Procedure: COLONOSCOPY;  Surgeon: Claudis RAYMOND Rivet, MD;  [facility];  Service: Endoscopy;  Laterality: N/A;  730   COLONOSCOPY WITH PROPOFOL  N/A 05/27/2020   Procedure: COLONOSCOPY WITH PROPOFOL ;  Surgeon: Eartha Angelia Sieving, MD;  Location: [facility];  Service: Gastroenterology;  Laterality: N/A;  Patient needs a unit of platelets prior to procedure.   ESOPHAGEAL BANDING N/A 05/27/2020   Procedure: ESOPHAGEAL BANDING;  Surgeon: Eartha Angelia Sieving, MD;  Location: [facility];  Service: Gastroenterology;  Laterality: N/A;   ESOPHAGOGASTRODUODENOSCOPY  02/28/2011   Procedure: ESOPHAGOGASTRODUODENOSCOPY (EGD);  Surgeon: Claudis RAYMOND Rivet, MD;  Location: [facility];  Service: Endoscopy;  Laterality: N/A;  1200   ESOPHAGOGASTRODUODENOSCOPY N/A 06/11/2012   Procedure: ESOPHAGOGASTRODUODENOSCOPY (EGD);  Surgeon: Claudis RAYMOND Rivet, MD;  Location[facility];  Service: Endoscopy;  Laterality:  N/A;  1200  FYI patient is 400 pounds   ESOPHAGOGASTRODUODENOSCOPY (EGD) WITH PROPOFOL  N/A 04/21/2014   Procedure: ESOPHAGOGASTRODUODENOSCOPY (EGD) WITH PROPOFOL ;  Surgeon: Claudis RAYMOND Rivet, MD;  Location: [facility] OR[facility];  Service: Endoscopy;  Laterality: N/A;   ESOPHAGOGASTRODUODENOSCOPY (EGD) WITH PROPOFOL  N/A 05/27/2020   Procedure: ESOPHAGOGASTRODUODENOSCOPY (EGD) WITH PROPOFOL ;  Surgeon: Eartha Angelia Sieving, MD;  [facility];  Service: Gastroenterology;  Laterality: N/A;  7:30 am   ESOPHAGOGASTRODUODENOSCOPY (EGD) WITH PROPOFOL  N/A 07/29/2020   Procedure: ESOPHAGOGASTRODUODENOSCOPY (EGD) WITH PROPOFOL ;  Surgeon: Eartha Angelia Sieving, MD;  [facility];  Service: Gastroenterology;  Laterality: N/A;  8:20   ESOPHAGOGASTRODUODENOSCOPY (EGD) WITH PROPOFOL  N/A 07/31/2022   Procedure: ESOPHAGOGASTRODUODENOSCOPY (EGD) WITH PROPOFOL ;  Surgeon: Eartha Angelia Sieving, MD;  [facility];  Service: Gastroenterology;  Laterality: N/A;  9:30am;asa 3   HERNIA REPAIR Right    as child; inguinal   HERNIA REPAIR  sept 2016   LIVER BIOPSY  2012   SCIATIC NERVE EXPLORATION     TONSILLECTOMY      Social History:  reports that he has never smoked. His smokeless tobacco use includes snuff and chew. He reports current alcohol use of about 4.0 standard drinks of alcohol per week. He reports that he does not use drugs.  Allergies  Allergen Reactions   Allopurinol  Other (See Comments)    Joint pain worse Caused worsening gout   Fluoxetine Hcl Itching    Family History  Problem Relation Age of Onset   Colon cancer Mother    Cancer Mother        intestine   Cancer Father        throat   Cancer Brother        brain   Cancer Sister    Heart attack Neg Hx    Stroke Neg Hx     Prior to Admission medications   Medication Sig Start Date End Date Taking? Authorizing Provider  albuterol  (VENTOLIN  HFA) 108 (90 Base) MCG/ACT inhaler INHALE 2 PUFFS BY MOUTH EVERY 6 HOURS  AS NEEDED FOR SHORTNESS OF BREATH OR WHEEZING 12/04/22   Jude Harden GAILS, MD  aspirin  EC 325 MG tablet Take 162.5 mg by mouth daily as needed (anticoagulant).    [provider]  ceFAZolin  (ANCEF ) IVPB Inject 2 g into the vein every 8 (eight) hours. Indication:  MSSA bacteremia First Dose: Yes Last Day of Therapy:  11/12/23 Labs - Once weekly:  CBC/D and CMP Pull PICC line at the completion of IV antibiotic therapy Method of administration: IV Push or per SNF protocol Method of administration may be changed at the discretion of home infusion pharmacist based upon assessment of the patient and/or caregiver's ability to self-administer the medication ordered. 10/05/23 11/14/23  Maree Bracken D, DO  cyanocobalamin  (V-R VITAMIN B-12) 500 MCG tablet Take 1 tablet (500 mcg total) by mouth every Monday, Wednesday, and Friday. 06/12/23   Lamon Pleasant HERO, PA-C  ezetimibe  (ZETIA ) 10 MG tablet Take 1 tablet (10 mg total) by mouth daily. 11/11/19   Dettinger, Fonda LABOR, MD  fluticasone  (FLONASE ) 50 MCG/ACT nasal spray USE 2 SPRAYS IN EACH NOSTRIL EVERY DAY AS NEEDED 01/15/20  Dettinger, Fonda LABOR, MD  folic acid  (FOLVITE ) 800 MCG tablet Take 1 tablet (800 mcg total) by mouth daily. 06/12/23   Lamon Pleasant HERO, PA-C  Heparin  Sod, Pork, Lock Flush (HEPARIN  LOCK FLUSH IV) Inject 3 mLs into the vein every 8 (eight) hours. Use 3 mL intravenously every 8 hours for flush of non-valved catheter lumens after med admin. Flush with 10 mL of normal saline before med; 10 mL normal saline after med; followed by heparin .    [provider]  hydrocortisone  (CORTEF ) 20 MG tablet Take 1 tablet (20 mg total) by mouth 2 (two) times daily. 10/05/23 11/04/23  Maree, Pratik D, DO  lactulose  (CONSTULOSE ) 10 GM/15ML solution TAKE 30 ML BY MOUTH TWICE DAILY Patient taking differently: Take 45 mLs by mouth 3 (three) times daily. 10/24/22   Carlan, Mitzie CROME, NP  levothyroxine  (SYNTHROID ) 125 MCG tablet Take 1 tablet (125  mcg total) by mouth daily at 6 (six) AM. 10/06/23 11/05/23  Maree, Pratik D, DO  metoprolol  succinate (TOPROL -XL) 100 MG 24 hr tablet Take 0.5 tablets (50 mg total) by mouth in the morning and at bedtime. 10/16/23   Darci Pore, MD  midodrine (PROAMATINE) 5 MG tablet Take 5 mg by mouth 2 (two) times daily with a meal.    [provider]  Nutritional Supplements (,FEEDING SUPPLEMENT, PROSOURCE PLUS) liquid Take 30 mLs by mouth 3 (three) times daily between meals. 10/16/23   Darci Pore, MD  OneTouch Delica Lancets 30G MISC Test BS QID Dx E11.9 05/27/19   Dettinger, Fonda LABOR, MD  pantoprazole  (PROTONIX ) 40 MG tablet TAKE 1 TABLET BY MOUTH TWICE DAILY 09/04/23   Castaneda Mayorga, Toribio, MD  potassium chloride  SA (KLOR-CON  M) 20 MEQ tablet Take 20 mEq by mouth daily.    [provider]  rosuvastatin (CRESTOR) 5 MG tablet Take 5 mg by mouth daily.    [provider]  Sodium Chloride  Flush (NORMAL SALINE FLUSH IV) Inject 10 mLs into the vein every 8 (eight) hours.    [provider]  spironolactone  (ALDACTONE ) 50 MG tablet Take 1 tablet (50 mg total) by mouth daily. 09/12/21   Lavona Agent, MD  thiamine  (VITAMIN B1) 100 MG tablet Take 100 mg by mouth daily.    [provider]  torsemide  (DEMADEX ) 20 MG tablet Take 20 mg by mouth daily.    [provider]  ZINC OXIDE, TOPICAL, 10 % CREA Apply 1 Application topically in the morning and at bedtime. Apply to reddened area to groin/scrotum for MASD Patient not taking: Reported on 10/10/2023    [provider]    Physical Exam:  Vitals:   10/19/23 2145 10/19/23 2330 10/20/23 0000  BP: 137/77 (!) 121/105 119/88  Pulse: 64 67 69  Resp: (!) 22 13 12   Temp: (!) 96.8 F (36 C)    TempSrc: Axillary    SpO2: 97% 94% 93%  Weight: (!) 161 kg    Height: 6' (1.829 m)      Constitutional: Obtunded, nonverbal. Skin: Numerous excoriations noted over the bilateral lower extremities  including a 2 cm diameter ulceration of the right lateral malleolus.  Patient additionally has a stage II sacral ulceration both present on admission. Eyes: Right gaze preference.  Pupils are equally reactive to light.  ENMT: Dry mucous membranes. Neck: normal, supple, no masses, no thyromegaly.  No evidence of jugular venous distension.   Respiratory: clear to auscultation bilaterally, no wheezing, no crackles. Normal respiratory effort. No accessory muscle use.  Cardiovascular:  Regular rate and rhythm, no murmurs / rubs / gallops. No extremity edema. 2+ pedal pulses. No carotid bruits.  Chest:   Nontender without crepitus or deformity.   Back:   Nontender without crepitus or deformity. Abdomen: Abdomen is protuberant but soft and nontender.  Notable ventral hernia that is reducible.  Positive bowel sounds all quadrants.   Musculoskeletal: No joint deformity upper and lower extremities. Good ROM, no contractures. Normal muscle tone.  Neurologic: Patient is essentially obtunded with spontaneous movement of all 4 extremities.  Patient is responsive to painful stimuli and does localize to pain.  Patient is unable to follow commands. Psychiatric: Unable to assess due to mentation.  Patient currently does not seem to possess insight as to his current situation.  Data Reviewed:  I have personally reviewed and interpreted labs, imaging.  Significant findings are   Lab Results  Component Value Date   WBC 7.4 10/19/2023   HGB 11.3 (L) 10/19/2023   HCT 33.6 (L) 10/19/2023   MCV 92.3 10/19/2023   PLT 38 (L) 10/19/2023   Lab Results  Component Value Date   K 4.2 10/19/2023   Lab Results  Component Value Date   BUN 31 (H) 10/19/2023   Lab Results  Component Value Date   CREATININE 1.89 (H) 10/19/2023      Telemetry: Personally reviewed.  Rhythm is normal sinus rhythm.  Assessment & Plan Acute hepatic encephalopathy (HCC) Alcoholic cirrhosis (HCC) Profound increase in ammonia, likely  the primary cause of the patient's obtundation No clinical evidence of recurrent infection or gastrointestinal bleeding as the cause. Patient unable to take oral intake, patient is n.p.o. Therefore lactulose  enemas have been ordered Severe hepatic encephalopathy can cause seizure, case discussed with Dr. Vanessa with neurology.  Teleneurology evaluation has also been obtained by [facility] ED provider. Keppra load has been initiated per neurology recommendations followed by scheduled Keppra dosing Transferring patient to [facility] for formal neurology consultation and EEG monitoring Close monitoring in progressive unit in case of clinical decline CT angiogram of head and neck per recommendation of neurology Seizure-like activity (HCC) Right gaze preference that is sustained, likely secondary to either focal seizure activity, likely nonconvulsive status epilepticus. Keppra load and schedule dosing have been ordered Transferring to [facility] for neurology consultation and EEG monitoring Seizure activity likely secondary to extreme hepatic encephalopathy. AKI (acute kidney injury) Recurrent acute kidney injury with creatinine of 1.89, up from 1.26 four days ago Likely an increase secondary to poor oral intake.  This is a frequent occurrence with this patient has been struggling with recurrent bouts of hepatic encephalopathy Holding outpatient regimen of diuretics Gentle intravenous hydration to be mindful of avoiding overload in this patient with cirrhosis Paroxysmal atrial fibrillation (HCC) Currently rate controlled and in sinus rhythm Historically patient is not on anticoagulation due to esophageal varices and high bleeding risk Monitoring on telemetry Pending repeat EKG Thrombocytopenia Coagulopathy  History of chronic thrombocytopenia and coagulopathy with elevated INR due to advanced liver disease Patient is at extremely high risk of bleeding complications and therefore  anticoagulation is being avoided Monitoring with serial CBCs and coagulation panels Infective endocarditis Patient diagnosed with suspected infective endocarditis in September due to MSSA bacteremia TEE at the time was unable to be performed due to history of esophageal varices Patient is currently on an empiric regimen of cefazolin  until 11/12/2023 Continuing cefazolin  via PICC line  Hypothyroidism Placing patient on reduced regimen of levothyroxine  IV while n.p.o. Adrenal insufficiency Transitioning to intravenous  hydrocortisone  twice daily while n.p.o. I do not think that stress dosing is necessary at this time Pressure injury of right ankle, stage 4 (HCC) Present on admission Wound care consultation placed.    Code Status:  Full code emergency guardianship is currently being obtained per review of prior notes.  MOST form that patient arrived with states that patient is full code.   Family Communication: No next of kin or designated Management consultant were identified.  Patient is undergoing emergency guardianship, a specific point of contact has not been successfully obtained.  Consults: Transferring to [facility] for neurology consultation.  Severity of Illness:  The appropriate patient status for this patient is INPATIENT. Inpatient status is judged to be reasonable and necessary in order to provide the required intensity of service to ensure the patient's safety. The patient's presenting symptoms, physical exam findings, and initial radiographic and laboratory data in the context of their chronic comorbidities is felt to place them at high risk for further clinical deterioration. Furthermore, it is not anticipated that the patient will be medically stable for discharge from the hospital within 2 midnights of admission.   * I certify that at the point of admission it is my clinical judgment that the patient will require inpatient hospital care spanning beyond 2 midnights from the point of  admission due to high intensity of service, high risk for further deterioration and high frequency of surveillance required.*  Author:  Zachary JINNY Ba MD  10/20/2023 12:31 AM

## 2023-10-20 NOTE — Progress Notes (Signed)
 Stat LTM EEG hooked up and running - no initial skin breakdown - push button tested - MRI compatible leads used. [facility] NOT monitoring patient in ED.

## 2023-10-20 NOTE — Progress Notes (Signed)
 Interval Progress Note  Assumed care of patient on day of admission. 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  Patient was near obtunded and unable to provide any history on admission.  He was notably recently hospitalized at [facility] from 10/2 until 10/8 for acute hepatic encephalopathy complicated by AKI.  He was managed with lactulose  and temporary holding of home diuretics Aldactone  and torsemide .  In the ED, the patient was minimally responsive with an ammonia level of 247, significantly elevated from 44 just 4 days prior.  Additionally, right gaze preference and occasional spontaneous movements of all 4 extremities were noted.  He was evaluated by teleneurology and Dr. Vanessa with neurology at [facility].  They recommend initiation of Keppra and transferred to [facility] for neurology consultation and EEG monitoring.  Alcoholic cirrhosis (HCC) Acute hepatic encephalopathy Profound increase in ammonia, likely the primary cause of the patient's obtundation No clinical evidence of recurrent infection or gastrointestinal bleeding as the cause. Patient unable to take oral intake, patient is n.p.o. Therefore lactulose  enemas have been ordered Severe hepatic encephalopathy can cause seizure, case discussed with Dr. Vanessa with neurology.  Teleneurology evaluation has also been obtained by [facility] ED provider. Keppra load has been initiated per neurology recommendations followed by  scheduled Keppra dosing Transferred to [facility] for formal neurology consultation and EEG monitoring Close monitoring in progressive unit in case of clinical decline Only 2 small loose bowel movements noted by nursing staff since 7 AM Ammonia improving to 172 Was notified lactulose  enemas are leaking around the inserted Flexi-Seal.  Ordered NG tube placement to administer lactulose  per NG tube. RN attempted twice but was unable to pass NG tube past the throat. Total bilirubin increasing to 4.3 from 3.6   Seizure-like activity (HCC) Right gaze preference that is sustained, likely secondary to either focal seizure activity, likely nonconvulsive status epilepticus. Seizure activity likely secondary to extreme hepatic encephalopathy. CT angiogram of head and neck showed distal right vertebral artery (V4) occlusion with retrograde flow to the right PICA; otherwise patent cervical and intracranial arteries with calcified but nonstenotic ICA plaques.  Findings also consistent with acute bilateral bronchopneumonia. Keppra load given Continuous EEG monitoring Continue Keppra 500 mg twice daily Neurology following  AKI (acute kidney injury) Recurrent acute kidney injury with creatinine of 1.89 and admission, up from 1.26 four days prior Slightly improved today to 1.81  Likely an increase secondary to poor oral intake.  This is a frequent occurrence with this patient has been struggling with recurrent bouts of hepatic encephalopathy Holding outpatient regimen of diuretics Gentle intravenous hydration to be mindful of avoiding overload in this patient with cirrhosis - LR 100 mL/hr for 10 hours  Paroxysmal atrial fibrillation (HCC) Currently rate controlled and in sinus rhythm Historically patient is not on anticoagulation due to esophageal varices and high bleeding risk Monitoring on telemetry  Thrombocytopenia Coagulopathy History of chronic thrombocytopenia and coagulopathy with  elevated INR due to advanced liver disease Patient is at extremely high risk of bleeding complications and therefore anticoagulation is being avoided Monitoring with serial CBCs and coagulation panels Platelets  dropped to 29K today will continue to monitor and transfuse to maintain platelets >20 K PT/INR ordered  Infective endocarditis Patient diagnosed with suspected infective endocarditis in September due to MSSA bacteremia TEE at the time was unable to be performed due to history of esophageal varices Patient is currently on an empiric regimen of cefazolin  until 11/12/2023 Continuing cefazolin  via PICC line   Hypothyroidism Placing patient on reduced regimen of levothyroxine  IV while n.p.o.  Adrenal insufficiency Transitioning to intravenous hydrocortisone  twice daily while n.p.o.  Pressure injury of right ankle, stage 4 (HCC) Present on admission Wound care consultation placed.   On exam this morning, the patient was noted to be obtunded, not responsive to verbal stimulus, without spontaneous eye opening.  He appeared to have right gaze preference with a equal and reactive pupils.  Given his significantly large body habitus, was unable to adequately assess his lung fields.  Large periumbilical hernia was noted on abdominal exam, but the abdomen was soft and nondistended.  Lower extremities with chronic venous stasis changes and 2+ pitting edema bilaterally.  Heel lift boots were in place.  Fecal management system was in place.  On reexamination this evening, the patient appeared a little bit more awake with spontaneous eye opening, however, still not responsive to verbal stimuli and does not follow commands.  He continued to demonstrate right gaze preference.  Appears to be a slightly improved neurological exam than this morning.  The rest of his exam was equivocal.  Due to concern for need for closer monitoring, PCCM was consulted, who graciously accepted the patient.  Patient will be  moved to ICU.  Further management per PCCM team.  Jeremy Ditullio Al-Sultani, MD [facility] 10/20/2023 6:49 PM

## 2023-10-20 NOTE — Assessment & Plan Note (Signed)
 Present on admission Wound care consultation placed.

## 2023-10-20 NOTE — Assessment & Plan Note (Signed)
 Transitioning to intravenous hydrocortisone  twice daily while n.p.o. I do not think that stress dosing is necessary at this time

## 2023-10-20 NOTE — Consult Note (Signed)
 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  Upon initial workup at [facility], patient had ammonia level of 247 with right gaze preference.  With these findings, teleneurology was consulted. Concern for seizure/stroke but patient not candidate for IV thrombolytics given hx of esophageal varices/thrombocytopenia. Patient was loaded with Keppra and transferred to to [facility] for further neurowork-up as well as EEG monitoring.  TRH consulted for admission.  10/12 patient pended for progressive bed and ultimately transferred to PCU.  Nursing staff can concerned with patient's worsening and ongoing acute encephalopathy.  LTM was reviewed by neurology concerned, originally showing diffuse slowing but around noon patient developed burst suppression pattern but on no burst suppression therapy.  Neurology recommended stat CT of head due to severe low platelet count of 29,000. PCCM consulted to assist with management and transfer patient to ICU for further neuro critical care monitoring.  Upon assessment, patient obtunded,  still having right gaze preference.  Also, patient withdrawing from pain in all 4 extremities.  Patient protecting airway at this time, O2 sats 99% on room air, appears to have dried old blood around oral mucosa.  Chest x-ray obtained, showing no evidence of pneumonia or aspiration at this time.  Placed stat CT of head order, ABG stat, T3/T4, lactic acid, CBC, and CMP.  Notified rapid response RN in regards to ICU transfer as well as stat CT of head to evaluate for intracranial abnormality/cerebral edema.  Pertinent  Medical History   Past Medical History:  Diagnosis Date   Anxiety    Atrial fibrillation (HCC)    Blood clot in vein    blood clot in portal vein   CHF (congestive heart failure) (HCC)    Cirrhosis (HCC)    NASH   Constipation    COPD (chronic obstructive pulmonary disease) (HCC)    Dysrhythmia    GERD (gastroesophageal reflux disease)    Gout    Leukopenia 07/08/2015   Neuromuscular disorder (HCC)    neuropathy in hands and feet   Psoriasis    RA (rheumatoid arthritis) (HCC)    Sleep apnea    cpap used- level 10 and greater   Thrombocytopenia      Significant Hospital Events: Including procedures, antibiotic start and stop dates in addition to other pertinent events   10/11 admitted for acute hepatic/metabolic encephalopathy 10/12 PCCM consulted for ongoing worsening acute metabolic encephalopathy, hyperammonemia, stat CT of head ordered  Interim History / Subjective:  Obtunded on EEG/LTM Dried blood around oral mucosa Currently maintaining airway at this time, O2 sats 99% on room air    Objective    Blood pressure 114/88, pulse 69, temperature (!) 97.5 F (36.4 C), temperature source Oral, resp. rate 16, height 6' (1.829 m), weight (!) 161 kg, SpO2 100%.  No intake or output data in the 24 hours ending 10/20/23 1933 Filed Weights   10/19/23 2145  Weight: (!) 161 kg    Examination: General: Acute, toxic appearing, on chronically ill adult male,  sitting up in progressive bed, obtunded but maintaining airway HENT: Normocephalic with EEG monitoring leads, PERRLA intact-3 mm B/L but sluggish, right gaze preference ongoing, dried blood within oral mucosa Lungs: Clear, diminished throughout, O2 sats 99% on room air Cardiovascular: S1/S2, irregular, A-fib to sinus rhythm, controlled rate Abdomen: Obese, bowel sounds hypoactive Extremities: Withdrawals extremities to pain, petechiae and bruising noted on all extremities/generalized Neuro: Obtunded, responds to painful stimuli by withdrawing, right gaze preference GU: Male PureWick GI: Flexi-Seal in place with stool output     Resolved problem list   Assessment and Plan  Acute hepatic encephalopathy in setting of alcoholic cirrhosis Acute metabolic encephalopathy Anion GAP Metabolic Acidosis  Hyperammonemia-initially 247> now 172 History of esophageal varices Recent admission at [facility] 10/2 - 10/8, ammonia level noted to be 44 ALP 143, AST 57, ALT <5  MELD 3.0: 30 at 10/20/2023  7:16 PM MELD-Na: 29 at 10/20/2023  7:16 PM CT of head on 10/11 impression no evidence of acute abnormality  CTA of head and neck 10/12 impression>Cervical carotid and vertebral arteries are patent but otherwise poorly evaluated due to motion artifact.Distal right vertebral artery occlusion (likely in the V4 segment) with retrograde reconstitution to the right PICA. Left vertebral artery is patent and supplies the Basilar. Bilateral ICA siphon calcified plaque without stenosis.  P: Continue lactulose  enema regimen- increase to q4hrs, trend ammonia levels- repeat every 6 hrs, monitor stool output via flexiseal If CT scan of head indicates significant cerebral edema- will need 3% saline admin Continue EEG monitoring via LTM  Neuro following appreciate assistance- recommend STAT CT of head to evaluate patient for intracranial abnormality due to burst suppression activity noted on EEG, however patient on no  burst suppression therapy  Continue Keppra IV, Continue seizure precautions Continue delirium and aspiration precautions  Frequent neurochecks, transfer to ICU after STAT monitoring Initiate neuroprotective measures- euglycemia, euvolemia, normothermia, trend electrolytes- maintain WNL  Continue High dose thiamine  and folic acid  admin   Concern for Acute Respiratory Insufficiency -currently able to protect airway Started on flagyl concern for aspiration  Chest X-ray looks clear, low lung volumes P: Can continue flagyl for now Continue aspiration precautions Obtain ABG STAT   AKI P: Continue to trend renal function daily  Continue to monitor and optimize electrolytes daily Continue to monitor urine output Continue strict I/Os Continue Adequate renal perfusion  Avoid nephrotoxic agents   Thrombocytopenia Plts 38>29>31 Hgb 11.3>11.1> 12.1 INR 2.3, PT 26.3  P: Continue monitoring for signs of bleeding, has dried blood around oral mucosa, no signs of aspiration at this time or active bleeding Continue to trend h/h, plt count on CBC daily  Evaluate daily for need of platelet administration   Infective endocarditis Hx of diastolic CHF-ECHO 9/23 EF 70-75% hyperdynamic function, NRWA, RV systolic function normal  Suspected in 9/25 of this year due to MSSA bactermia  TEE unable to be obtained due to hx of esophageal varices P: Continue Cefazolin  until 11/12/23  No elevation in WBC, Obtain lactic acid, continue to trend  Obtain blood cultures   Hypothyroidism TSH elevated in past (09/27/23-45.347), currently 16.300 Receiving IV synthroid   P:  Check t3, t4 Continue IV synthroid    Adrenal insufficiency P: Check cortisol level  Continue IV steroids   Proximal A-fib Not on  any anticoags due to ongoing thrombocytopenia  Hx of esophageal varices  P: Continue monitor on cardiac tele    Labs   CBC: Recent Labs  Lab 10/15/23 0516 10/16/23 0522 10/19/23 2144  10/20/23 0923 10/20/23 1916  WBC 8.2 7.9 7.4 5.7 PENDING  NEUTROABS  --   --  6.1  --  PENDING  HGB 12.5* 12.4* 11.3* 11.1* PENDING  HCT 37.8* 37.1* 33.6* 32.3* PENDING  MCV 92.6 92.3 92.3 91.0 PENDING  PLT 48* 42* 38* 29* 31*    Basic Metabolic Panel: Recent Labs  Lab 10/14/23 0436 10/15/23 0516 10/16/23 0522 10/19/23 2144 10/20/23 1044  NA 137 133* 130* 131* 132*  K 2.8* 3.2* 3.3* 4.2 4.4  CL 104 101 99 100 100  CO2 17* 17* 18* 19* 16*  GLUCOSE 122* 114* 109* 93 92  BUN 31* 29* 25* 31* 32*  CREATININE 1.54* 1.51* 1.26* 1.89* 1.81*  CALCIUM 9.0 9.0 9.1 9.0 8.4*  MG  --   --   --  2.1  --    GFR: Estimated Creatinine Clearance: 68.1 mL/min (A) (by C-G formula based on SCr of 1.81 mg/dL (H)). Recent Labs  Lab 10/16/23 0522 10/19/23 2144 10/20/23 0923 10/20/23 1916  WBC 7.9 7.4 5.7 PENDING    Liver Function Tests: Recent Labs  Lab 10/19/23 2144 10/20/23 1044  AST 63* 57*  ALT 7 <5  ALKPHOS 168* 143*  BILITOT 3.6* 4.3*  PROT 6.0* 5.7*  ALBUMIN  2.6* 2.0*   No results for input(s): LIPASE, AMYLASE in the last 168 hours. Recent Labs  Lab 10/15/23 0516 10/16/23 0522 10/19/23 2152 10/20/23 0923  AMMONIA 38* 44* 247* 172*    ABG    Component Value Date/Time   PHART 7.41 09/26/2023 0707   PCO2ART 51 (H) 09/26/2023 0707   PO2ART 73 (L) 09/26/2023 0707   HCO3 17.5 (L) 10/20/2023 1915   TCO2 21 04/17/2007 1935   ACIDBASEDEF 4.4 (H) 10/20/2023 1915   O2SAT 95 10/20/2023 1915     Coagulation Profile: Recent Labs  Lab 10/19/23 2144  INR 2.3*    Cardiac Enzymes: No results for input(s): CKTOTAL, CKMB, CKMBINDEX, TROPONINI in the last 168 hours.  HbA1C: HB A1C (BAYER DCA - WAIVED)  Date/Time Value Ref Range Status  11/16/2019 11:54 AM 4.9 <7.0 % Final    Comment:                                          Diabetic Adult            <7.0                                       Healthy Adult        4.3 - 5.7                                                            (DCCT/NGSP) American Diabetes Association's Summary of Glycemic Recommendations for Adults with Diabetes: Hemoglobin A1c <7.0%. More stringent glycemic goals (A1c <6.0%) may further reduce complications at the cost of increased risk of hypoglycemia.   02/17/2018  10:26 AM 6.0 <7.0 % Final    Comment:                                          Diabetic Adult            <7.0                                       Healthy Adult        4.3 - 5.7                                                           (DCCT/NGSP) American Diabetes Association's Summary of Glycemic Recommendations for Adults with Diabetes: Hemoglobin A1c <7.0%. More stringent glycemic goals (A1c <6.0%) may further reduce complications at the cost of increased risk of hypoglycemia.    Hgb A1c MFr Bld  Date/Time Value Ref Range Status  05/15/2019 04:29 AM 6.2 (H) 4.8 - 5.6 % Final    Comment:    (NOTE) Pre diabetes:          5.7%-6.4% Diabetes:              >6.4% Glycemic control for   <7.0% adults with diabetes   04/22/2019 02:10 AM 5.7 (H) 4.8 - 5.6 % Final    Comment:    (NOTE)         Prediabetes: 5.7 - 6.4         Diabetes: >6.4         Glycemic control for adults with diabetes: <7.0     CBG: No results for input(s): GLUCAP in the last 168 hours.  Review of Systems:   See HPI   Past Medical History:  He,  has a past medical history of Anxiety, Atrial fibrillation (HCC), Blood clot in vein, CHF (congestive heart failure) (HCC), Cirrhosis (HCC), Constipation, COPD (chronic obstructive pulmonary disease) (HCC), Dysrhythmia, GERD (gastroesophageal reflux disease), Gout, Leukopenia (07/08/2015), Neuromuscular disorder (HCC), Psoriasis, RA (rheumatoid arthritis) (HCC), Sleep apnea, and Thrombocytopenia.   Surgical History:   Past Surgical History:  Procedure Laterality Date   ANKLE SURGERY     right ankle talor repair   CHOLECYSTECTOMY  sept 2016   CHOLECYSTECTOMY     COLONOSCOPY   05/08/2011   Procedure: COLONOSCOPY;  Surgeon: Claudis RAYMOND Rivet, MD;  Location[facility];  Service: Endoscopy;  Laterality: N/A;  730   COLONOSCOPY WITH PROPOFOL  N/A 05/27/2020   Procedure: COLONOSCOPY WITH PROPOFOL ;  Surgeon: Eartha Angelia Sieving, MD;  Location: [facility];  Service: Gastroenterology;  Laterality: N/A;  Patient needs a unit of platelets prior to procedure.   ESOPHAGEAL BANDING N/A 05/27/2020   Procedure: ESOPHAGEAL BANDING;  Surgeon: Eartha Angelia Sieving, MD;  [facility];  Service: Gastroenterology;  Laterality: N/A;   ESOPHAGOGASTRODUODENOSCOPY  02/28/2011   Procedure: ESOPHAGOGASTRODUODENOSCOPY (EGD);  Surgeon: Claudis RAYMOND Rivet, MD;  [facility];  Service: Endoscopy;  Laterality: N/A;  1200   ESOPHAGOGASTRODUODENOSCOPY N/A 06/11/2012   Procedure: ESOPHAGOGASTRODUODENOSCOPY (EGD);  Surgeon: Claudis RAYMOND Rivet, MD;  [facility]: [facility];  Service: Endoscopy;  Laterality: N/A;  1200  FYI patient is 400 pounds   ESOPHAGOGASTRODUODENOSCOPY (EGD) WITH PROPOFOL  N/A 04/21/2014   Procedure: ESOPHAGOGASTRODUODENOSCOPY (EGD) WITH PROPOFOL ;  Surgeon: Claudis RAYMOND Rivet, MD;  Location: [facility];  Service: Endoscopy;  Laterality: N/A;   ESOPHAGOGASTRODUODENOSCOPY (EGD) WITH PROPOFOL  N/A 05/27/2020   Procedure: ESOPHAGOGASTRODUODENOSCOPY (EGD) WITH PROPOFOL ;  Surgeon: Eartha Angelia Sieving, MD;  [facility];  Service: Gastroenterology;  Laterality: N/A;  7:30 am   ESOPHAGOGASTRODUODENOSCOPY (EGD) WITH PROPOFOL  N/A 07/29/2020   Procedure: ESOPHAGOGASTRODUODENOSCOPY (EGD) WITH PROPOFOL ;  Surgeon: Eartha Angelia Sieving, MD;  [facility];  Service: Gastroenterology;  Laterality: N/A;  8:20   ESOPHAGOGASTRODUODENOSCOPY (EGD) WITH PROPOFOL  N/A 07/31/2022   Procedure: ESOPHAGOGASTRODUODENOSCOPY (EGD) WITH PROPOFOL ;  Surgeon: Eartha Angelia Sieving, MD;  [facility];  Service: Gastroenterology;  Laterality: N/A;  9:30am;asa 3    HERNIA REPAIR Right    as child; inguinal   HERNIA REPAIR  sept 2016   LIVER BIOPSY  2012   SCIATIC NERVE EXPLORATION     TONSILLECTOMY       Social History:   reports that he has never smoked. His smokeless tobacco use includes snuff and chew. He reports current alcohol use of about 4.0 standard drinks of alcohol per week. He reports that he does not use drugs.   Family History:  His family history includes Cancer in his brother, father, mother, and sister; Colon cancer in his mother. There is no history of Heart attack or Stroke.   Allergies Allergies  Allergen Reactions   Allopurinol  Other (See Comments)    Joint pain worse Caused worsening gout   Fluoxetine Hcl Itching     Home Medications  Prior to Admission medications   Medication Sig Start Date End Date Taking? Authorizing Provider  acetaminophen  (TYLENOL ) 325 MG tablet Take 650 mg by mouth every 8 (eight) hours as needed for mild pain (pain score 1-3).   Yes [provider]  albuterol  (VENTOLIN  HFA) 108 (90 Base) MCG/ACT inhaler INHALE 2 PUFFS BY MOUTH EVERY 6 HOURS AS NEEDED FOR SHORTNESS OF BREATH OR WHEEZING 12/04/22  Yes Jude Harden GAILS, MD  aspirin  EC 325 MG tablet Take 162.5 mg by mouth daily as needed (anticoagulant).   Yes [provider]  ceFAZolin  (ANCEF ) IVPB Inject 2 g into the vein every 8 (eight) hours. Indication:  MSSA bacteremia First Dose: Yes Last Day of Therapy:  11/12/23 Labs - Once weekly:  CBC/D and CMP Pull PICC line at the completion of IV antibiotic therapy Method of administration: IV Push or per SNF protocol Method of administration may be changed at the discretion of home infusion pharmacist based upon assessment of the patient and/or caregiver's ability to self-administer the medication ordered. 10/05/23 11/14/23 Yes Maree, Pratik D, DO  cyanocobalamin  (V-R VITAMIN B-12) 500 MCG tablet Take 1 tablet (500 mcg total) by mouth every Monday, Wednesday, and Friday. 06/12/23  Yes  Pennington, Rebekah M, PA-C  ezetimibe  (ZETIA ) 10 MG tablet Take 1 tablet (10 mg total) by mouth daily. 11/11/19  Yes Dettinger, Fonda LABOR, MD  fluticasone  (FLONASE ) 50 MCG/ACT nasal spray USE 2 SPRAYS IN EACH NOSTRIL EVERY DAY AS NEEDED 01/15/20  Yes Dettinger, Fonda LABOR, MD  folic acid  (FOLVITE ) 800 MCG tablet Take 1 tablet (800 mcg total) by mouth daily. 06/12/23  Yes Pennington, Rebekah M, PA-C  Heparin  Sod, Pork, Lock Flush (HEPARIN  LOCK FLUSH IV) Inject 3 mLs into the vein every 8 (eight) hours. Use 3 mL intravenously every 8 hours for flush  of non-valved catheter lumens after med admin. Flush with 10 mL of normal saline before med; 10 mL normal saline after med; followed by heparin .   Yes [provider]  hydrocortisone  (CORTEF ) 20 MG tablet Take 1 tablet (20 mg total) by mouth 2 (two) times daily. 10/05/23 11/04/23 Yes Shah, Pratik D, DO  lactulose  (CONSTULOSE ) 10 GM/15ML solution TAKE 30 ML BY MOUTH TWICE DAILY Patient taking differently: Take 45 mLs by mouth 3 (three) times daily. 10/24/22  Yes Carlan, Chelsea L, NP  levothyroxine  (SYNTHROID ) 125 MCG tablet Take 1 tablet (125 mcg total) by mouth daily at 6 (six) AM. 10/06/23 11/05/23 Yes Shah, Pratik D, DO  metoprolol  succinate (TOPROL -XL) 100 MG 24 hr tablet Take 0.5 tablets (50 mg total) by mouth in the morning and at bedtime. 10/16/23  Yes Darci Pore, MD  midodrine (PROAMATINE) 5 MG tablet Take 5 mg by mouth 2 (two) times daily with a meal.   Yes [provider]  Nutritional Supplements (,FEEDING SUPPLEMENT, PROSOURCE PLUS) liquid Take 30 mLs by mouth 3 (three) times daily between meals. 10/16/23  Yes Darci Pore, MD  pantoprazole  (PROTONIX ) 40 MG tablet TAKE 1 TABLET BY MOUTH TWICE DAILY 09/04/23  Yes Castaneda Mayorga, Toribio, MD  potassium chloride  SA (KLOR-CON  M) 20 MEQ tablet Take 20 mEq by mouth daily.   Yes [provider]  rosuvastatin (CRESTOR) 5 MG tablet Take 5 mg by mouth daily.   Yes  [provider]  Sodium Chloride  Flush (NORMAL SALINE FLUSH IV) Inject 10 mLs into the vein every 8 (eight) hours.   Yes [provider]  thiamine  (VITAMIN B1) 100 MG tablet Take 100 mg by mouth daily.   Yes [provider]  ZINC OXIDE, TOPICAL, 10 % CREA Apply 1 Application topically in the morning and at bedtime. Apply to reddened area to groin/scrotum for MASD   Yes [provider]  OneTouch Delica Lancets 30G MISC Test BS QID Dx E11.9 05/27/19   Dettinger, Fonda LABOR, MD  spironolactone  (ALDACTONE ) 50 MG tablet Take 1 tablet (50 mg total) by mouth daily. Patient not taking: Reported on 10/20/2023 09/12/21   Lavona Agent, MD  torsemide  (DEMADEX ) 20 MG tablet Take 20 mg by mouth daily. Patient not taking: Reported on 10/20/2023    [provider]     Critical care time: 65 mins     [facility]   [facility] 10/20/2023, 8:28 PM  Please see [email or web address] for pager details.  From 7A-7P if no response, please call [phone]. After hours, please call ELink [phone].

## 2023-10-20 NOTE — Progress Notes (Signed)
 After CT scan, Pt transferred to ICU 4N30 with a staff nurse. EEG was disconnected during transferring. Pt is hemodynamically stable, afebrile, responsive to painful stimuli, on room air, no respiratory distress. Report was given to Dorn Sharps, RN.    Wendi Dash, RN

## 2023-10-20 NOTE — ED Notes (Signed)
 Admitting made aware of critical Platelets.

## 2023-10-20 NOTE — ED Notes (Signed)
 Pt arrived with [facility] from [facility] for acute encephalopathy. Pt needs CT angio however was unable to be completed due to restlessness. Reported R sided gaze; pupils PERRL. PICC to left upper arm. Purewick and flexiseal in place

## 2023-10-20 NOTE — Progress Notes (Addendum)
 NEUROLOGY CONSULT FOLLOW UP NOTE   Date of service: October 20, 2023 Patient Name: [name] MRN:  [id number] DOB:  1965/03/28  Interval Hx/subjective   58M with cirrhosis, hepatic encephalopathy and Ammonia of 247 tonight, thrombocytopenia, afibb p/w progressive confusion x 24 hours at his facility and sent to the ED at [facility] with profound encephalopathy and R gaze deviation.  Seen by teleneurology at [facility]. Not offered tnkase as he is outside window and has thrombocytopenia. Not offered thrombectomy due to concern for endocarditis with recent MSSA bacteremia. Team was unable to rule out endocarditis as TEE was not otained due to esophageal varices.  Transferred to [facility] for inperson neurology evaluation and concern for stroke vs seizure.   Vitals   Vitals:   10/19/23 2330 10/20/23 0000 10/20/23 0132 10/20/23 0133  BP: (!) 121/105 119/88 109/67   Pulse: 67 69 63 65  Resp: 13 12 11 11   Temp:  (!) 97.1 F (36.2 C)    TempSrc:  Axillary    SpO2: 94% 93% 100% 99%  Weight:      Height:         Body mass index is 48.15 kg/m.  Physical Exam   General: Laying comfortably in bed; sonorous respirations, morbidly obese HENT: unabl to assess oropharynx and mucosa as he resists mouth opening. Normal external appearance of ears and nose. Neck: Supple, no pain or tenderness  CV: No JVD. No peripheral edema.  Pulmonary: Symmetric Chest rise. Normal respiratory effort. Abdomen: Soft to touch, non-tender. Ext: No cyanosis, edema, or deformity. Noted bruising in BL uppers extremities and in his feet with some scabbed wounds. Skin: Normal palpation of skin. Musculoskeletal: Normal digits and nails by inspection. No clubbing.  Neurologic Examination  Mental status/Cognition: eyes closed, does no open eyes to voice or loud clap but does grimace to loud clap.does not answer orientation questions. Speech/language: mute, no speech Cranial nerves:   CN II Pupils  equal and reactive to light, unable to assess for VF deficits, does not blink to threat.   CN III,IV,VI Has roving eye movements with extreme horizontal gaze in either direction. No sustained L or R gaze noted.   CN V Corneals intact BL   CN VII Symmetric upper facial grimace.   CN VIII Unable to assess.   CN IX & X Protecting his airway so far. Cough noted to Qtip in his nose.   CN XI Head is midline   CN XII Does not protrude tongue on command.   Sensation/Motor:  Muscle bulk: poor, tone normal Stimulus induced brief myoclonic jerks in BL uppers. Withdraws BL upper to proximal pinch. Some movement in BL lowers to proximal pinch.  Coordination/Complex Motor:  Unable to assess.  Medications  Current Facility-Administered Medications:    acetaminophen  (TYLENOL ) tablet 650 mg, 650 mg, Oral, Q6H PRN **OR** acetaminophen  (TYLENOL ) suppository 650 mg, 650 mg, Rectal, Q6H PRN, Shalhoub, Zachary PARAS, MD   albuterol  (PROVENTIL ) (2.5 MG/3ML) 0.083% nebulizer solution 2.5 mg, 2.5 mg, Nebulization, Q4H PRN, Shalhoub, Zachary PARAS, MD   ceFAZolin  (ANCEF ) IVPB 2g/100 mL premix, 2 g, Intravenous, Q8H, Shalhoub, Zachary PARAS, MD   [START ON 10/21/2023] cyanocobalamin  (VITAMIN B12) injection 1,000 mcg, 1,000 mcg, Intramuscular, Q M,W,F, Shalhoub, Zachary PARAS, MD   fluticasone  (FLONASE ) 50 MCG/ACT nasal spray 2 spray, 2 spray, Each Nare, Daily PRN, Shalhoub, Zachary PARAS, MD   hydrocortisone  sodium succinate  (SOLU-CORTEF ) 100 MG injection 20 mg, 20 mg, Intravenous, Q12H, Shalhoub, Zachary PARAS, MD  lactated ringers  infusion, , Intravenous, Continuous, Shalhoub, Zachary PARAS, MD, Last Rate: 100 mL/hr at 10/20/23 0203, New Bag at 10/20/23 0203   lactulose  (CHRONULAC ) enema 200 gm, 300 mL, Rectal, TID, Shalhoub, Zachary PARAS, MD   levETIRAcetam (KEPPRA) undiluted injection 500 mg, 500 mg, Intravenous, Q12H, Shalhoub, George J, MD   levothyroxine  (SYNTHROID , LEVOTHROID) injection 95 mcg, 95 mcg, Intravenous, Daily, Shalhoub, Zachary PARAS,  MD   pantoprazole  (PROTONIX ) injection 40 mg, 40 mg, Intravenous, Q12H, Shalhoub, Zachary PARAS, MD, 40 mg at 10/20/23 0159   thiamine  (VITAMIN B1) 500 mg in sodium chloride  0.9 % 50 mL IVPB, 500 mg, Intravenous, Q8H, Last Rate: 110 mL/hr at 10/20/23 0317, 500 mg at 10/20/23 0317 **FOLLOWED BY** [START ON 10/22/2023] thiamine  (VITAMIN B1) 250 mg in sodium chloride  0.9 % 50 mL IVPB, 250 mg, Intravenous, Daily **FOLLOWED BY** [START ON 10/28/2023] thiamine  (VITAMIN B1) injection 100 mg, 100 mg, Intravenous, Daily, Ell Tiso, MD  Current Outpatient Medications:    albuterol  (VENTOLIN  HFA) 108 (90 Base) MCG/ACT inhaler, INHALE 2 PUFFS BY MOUTH EVERY 6 HOURS AS NEEDED FOR SHORTNESS OF BREATH OR WHEEZING, Disp: 18 g, Rfl: 3   aspirin  EC 325 MG tablet, Take 162.5 mg by mouth daily as needed (anticoagulant)., Disp: , Rfl:    ceFAZolin  (ANCEF ) IVPB, Inject 2 g into the vein every 8 (eight) hours. Indication:  MSSA bacteremia First Dose: Yes Last Day of Therapy:  11/12/23 Labs - Once weekly:  CBC/D and CMP Pull PICC line at the completion of IV antibiotic therapy Method of administration: IV Push or per SNF protocol Method of administration may be changed at the discretion of home infusion pharmacist based upon assessment of the patient and/or caregiver's ability to self-administer the medication ordered., Disp: 40 Units, Rfl: 0   cyanocobalamin  (V-R VITAMIN B-12) 500 MCG tablet, Take 1 tablet (500 mcg total) by mouth every Monday, Wednesday, and Friday., Disp: 45 tablet, Rfl: 3   ezetimibe  (ZETIA ) 10 MG tablet, Take 1 tablet (10 mg total) by mouth daily., Disp: 30 tablet, Rfl: 5   fluticasone  (FLONASE ) 50 MCG/ACT nasal spray, USE 2 SPRAYS IN EACH NOSTRIL EVERY DAY AS NEEDED, Disp: 48 mL, Rfl: 1   folic acid  (FOLVITE ) 800 MCG tablet, Take 1 tablet (800 mcg total) by mouth daily., Disp: 30 tablet, Rfl: 11   Heparin  Sod, Pork, Lock Flush (HEPARIN  LOCK FLUSH IV), Inject 3 mLs into the vein every 8 (eight) hours.  Use 3 mL intravenously every 8 hours for flush of non-valved catheter lumens after med admin. Flush with 10 mL of normal saline before med; 10 mL normal saline after med; followed by heparin ., Disp: , Rfl:    hydrocortisone  (CORTEF ) 20 MG tablet, Take 1 tablet (20 mg total) by mouth 2 (two) times daily., Disp: 60 tablet, Rfl: 0   lactulose  (CONSTULOSE ) 10 GM/15ML solution, TAKE 30 ML BY MOUTH TWICE DAILY (Patient taking differently: Take 45 mLs by mouth 3 (three) times daily.), Disp: 1892 mL, Rfl: 2   levothyroxine  (SYNTHROID ) 125 MCG tablet, Take 1 tablet (125 mcg total) by mouth daily at 6 (six) AM., Disp: 30 tablet, Rfl: 0   metoprolol  succinate (TOPROL -XL) 100 MG 24 hr tablet, Take 0.5 tablets (50 mg total) by mouth in the morning and at bedtime., Disp: , Rfl:    midodrine (PROAMATINE) 5 MG tablet, Take 5 mg by mouth 2 (two) times daily with a meal., Disp: , Rfl:    Nutritional Supplements (,FEEDING SUPPLEMENT, PROSOURCE PLUS) liquid, Take 30 mLs  by mouth 3 (three) times daily between meals., Disp: 2700 mL, Rfl: 1   OneTouch Delica Lancets 30G MISC, Test BS QID Dx E11.9, Disp: 400 each, Rfl: 3   pantoprazole  (PROTONIX ) 40 MG tablet, TAKE 1 TABLET BY MOUTH TWICE DAILY, Disp: 180 tablet, Rfl: 0   potassium chloride  SA (KLOR-CON  M) 20 MEQ tablet, Take 20 mEq by mouth daily., Disp: , Rfl:    rosuvastatin (CRESTOR) 5 MG tablet, Take 5 mg by mouth daily., Disp: , Rfl:    Sodium Chloride  Flush (NORMAL SALINE FLUSH IV), Inject 10 mLs into the vein every 8 (eight) hours., Disp: , Rfl:    [Paused] spironolactone  (ALDACTONE ) 50 MG tablet, Take 1 tablet (50 mg total) by mouth daily., Disp: 30 tablet, Rfl: 6   thiamine  (VITAMIN B1) 100 MG tablet, Take 100 mg by mouth daily., Disp: , Rfl:    [Paused] torsemide  (DEMADEX ) 20 MG tablet, Take 20 mg by mouth daily., Disp: , Rfl:    ZINC OXIDE, TOPICAL, 10 % CREA, Apply 1 Application topically in the morning and at bedtime. Apply to reddened area to groin/scrotum for  MASD (Patient not taking: Reported on 10/10/2023), Disp: , Rfl:   Labs and Diagnostic Imaging   CBC:  Recent Labs  Lab 10/16/23 0522 10/19/23 2144  WBC 7.9 7.4  NEUTROABS  --  6.1  HGB 12.4* 11.3*  HCT 37.1* 33.6*  MCV 92.3 92.3  PLT 42* 38*    Basic Metabolic Panel:  Lab Results  Component Value Date   NA 131 (L) 10/19/2023   K 4.2 10/19/2023   CO2 19 (L) 10/19/2023   GLUCOSE 93 10/19/2023   BUN 31 (H) 10/19/2023   CREATININE 1.89 (H) 10/19/2023   CALCIUM 9.0 10/19/2023   GFRNONAA 40 (L) 10/19/2023   GFRAA 90 02/22/2020   Lipid Panel:  Lab Results  Component Value Date   LDLCALC 65 11/16/2019   HgbA1c:  Lab Results  Component Value Date   HGBA1C 4.9 11/16/2019   Urine Drug Screen:     Component Value Date/Time   LABOPIA NONE DETECTED 09/26/2023 0844   COCAINSCRNUR NONE DETECTED 09/26/2023 0844   LABBENZ NONE DETECTED 09/26/2023 0844   AMPHETMU NONE DETECTED 09/26/2023 0844   THCU NONE DETECTED 09/26/2023 0844   LABBARB NONE DETECTED 09/26/2023 0844    Alcohol Level     Component Value Date/Time   ETH <15 10/19/2023 2144   INR  Lab Results  Component Value Date   INR 2.3 (H) 10/19/2023   APTT  Lab Results  Component Value Date   APTT 75 (H) 10/19/2023   AED levels: No results found for: PHENYTOIN, ZONISAMIDE, LAMOTRIGINE, LEVETIRACETA  CT Head without contrast(Personally reviewed): CTH was negative for a large hypodensity concerning for a large territory infarct or hyperdensity concerning for an ICH  CT angio Head and Neck with contrast(Personally reviewed): Pending  MRI Brain(Personally reviewed): pending  cEEG:  Pending.  Assessment   [name] is a 58 y.o. male with cirrhosis, hepatic encephalopathy and Ammonia of 247 tonight, thrombocytopenia, afibb p/w progressive confusion x 24 hours at his facility and sent to the ED at [facility] with profound encephalopathy and R gaze deviation.  Seen by teleneurology  at [facility]. Not offered tnkase as he is outside window and has thrombocytopenia. Not offered thrombectomy due to concern for endocarditis with recent MSSA bacteremia. Team was unable to rule out endocarditis as TEE was not otained due to esophageal varices.  Transferred to [facility] for inperson neurology evaluation and concern for stroke vs seizure.  Neuro exam tonight is not concerning for seizures or stroke. Noted to have roving eye movements on my evaluation with extreme horizontal gaze in either direction. No sustained L or R gaze noted. Dolls eyes reflex is present. Brainstem reflexes as all present.  Exam most concerning for profound encephalopathy most likely secondary to hyperammonemia with his current ammonia of 247.  Recommendations  - CTA head and neck. Some difficulty as he keeps moving his head - MRI Brain without contrast when able - cEEG in AM. - Keppra load of 4500mg  IV once was given at [facility]. Further AEDs based on LTM EEG. - lactulose  enema and titrate to 3 bowel movements a day. - monitor ammonia levels. - high dose thiamine . - not a candidate for anticoagulation for noted Afibb given significant thrombocytopenia and coagulopathy with his current INR of 2.3. ______________________________________________________________________  Plan discussed with Dr. Kenard with the Hospitalist team.  This patient is critically ill and at significant risk of neurological worsening, death and care requires constant monitoring of vital signs, hemodynamics,respiratory and cardiac monitoring, neurological assessment, discussion with family, other specialists and medical decision making of high complexity. I spent 70 minutes of neurocritical care time  in the care of  this patient. This was time spent independent of any time provided by nurse practitioner or PA.  Akif Weldy [facility] 10/20/2023  4:05 AM   Signed, Skylor Schnapp, MD [facility]

## 2023-10-20 NOTE — ED Notes (Addendum)
 Lactulose  enema that was given was held approx 20 minutes until leakage noticed from the rectum around the flexiseal. When  admitting at bedside was made aware. There were 2 small amounts of stool noted flowing correctly through the seal, not large a volume has been expelled since 7am

## 2023-10-20 NOTE — ED Notes (Addendum)
 Paige RN X2 attempts NG pt tolerated well 02 stayed at 97% with in ability to swallow down kept curling down the throat

## 2023-10-20 NOTE — Assessment & Plan Note (Signed)
 Profound increase in ammonia, likely the primary cause of the patient's obtundation No clinical evidence of recurrent infection or gastrointestinal bleeding as the cause. Patient unable to take oral intake, patient is n.p.o. Therefore lactulose  enemas have been ordered Severe hepatic encephalopathy can cause seizure, case discussed with Dr. Vanessa with neurology.  Teleneurology evaluation has also been obtained by [facility] ED provider. Keppra load has been initiated per neurology recommendations followed by scheduled Keppra dosing Transferring patient to [facility] for formal neurology consultation and EEG monitoring Close monitoring in progressive unit in case of clinical decline CT angiogram of head and neck per recommendation of neurology

## 2023-10-20 NOTE — Assessment & Plan Note (Signed)
 Patient diagnosed with suspected infective endocarditis in September due to MSSA bacteremia TEE at the time was unable to be performed due to history of esophageal varices Patient is currently on an empiric regimen of cefazolin  until 11/12/2023 Continuing cefazolin  via PICC line

## 2023-10-20 NOTE — Assessment & Plan Note (Signed)
 Right gaze preference that is sustained, likely secondary to either focal seizure activity, likely nonconvulsive status epilepticus. Keppra load and schedule dosing have been ordered Transferring to [facility] for neurology consultation and EEG monitoring Seizure activity likely secondary to extreme hepatic encephalopathy.

## 2023-10-20 NOTE — Consult Note (Addendum)
 WOC Nurse Consult Note: patient is known to [facility] team from previous admission at [facility] (last consult 10/11/2023) for full thickness wound to R lateral ankle and severe moisture associated skin damage buttocks  Reason for Consult: R lateral malleolus  Wound type: 1.  Full thickness R lateral malleolus r/t PAD tan necrotic tissue still present  2.  Moisture Associated Skin Damage with likely fungal component sacrum/bilateral buttocks  3.  Stage 3 Pressure Injuries bilateral medial buttocks varying amounts of necrotic tissue  4.  Intertriginous dermatitis B groin/scrotum widespread erythema with scattered partial thickness skin loss  5.  Bilateral plantar feet with dry scaling skin, purpura noted, some scattered dark purple areas (patient with thrombocytopenia)  Pressure Injury POA: Yes  Measurement: see nursing flowsheet  Wound bed: as above  Drainage (amount, consistency, odor) see nursing flowsheet  Periwound: pressure injuries in setting of significant moisture associated skin damage and ? Fungal infection  Dressing procedure/placement/frequency:  Cleanse sacrum and B buttocks with Vashe wound cleanser (do not rinse and allow to air dry).  Apply Gerhardt's Butt Cream to area 3 times a day and cover with ABD pad, would avoid silicone foam as this will hold moisture onto skin.  Cleanse inner thighs/groin/scrotum with soap and water , dry and apply Gerhardt's 3 times a day and prn soiling.  Cleanse R lateral ankle wound with Vashe then Apply 1/4 thick layer of Santyl to wound bed, top with saline moist gauze, top with dry gauze and silicone foam or Kerlix roll gauze whichever works best.  Will write for Eucerin to B feet and lower legs 2 times daily.   POC discussed with bedside nurse. WOC team will not follow. Re-consult if further needs arise.   Thank you,    Powell Bar MSN, RN-BC, [employer]

## 2023-10-20 NOTE — Assessment & Plan Note (Signed)
 Recurrent acute kidney injury with creatinine of 1.89, up from 1.26 four days ago Likely an increase secondary to poor oral intake.  This is a frequent occurrence with this patient has been struggling with recurrent bouts of hepatic encephalopathy Holding outpatient regimen of diuretics Gentle intravenous hydration to be mindful of avoiding overload in this patient with cirrhosis

## 2023-10-20 NOTE — Assessment & Plan Note (Signed)
 Placing patient on reduced regimen of levothyroxine  IV while n.p.o.

## 2023-10-20 NOTE — Progress Notes (Signed)
 eLink Physician-Brief Progress Note Patient Name: REACE BRESHEARS DOB: 24-Sep-1965 MRN: [id number]   Date of Service  10/20/2023  HPI/Events of Note  Now in ICU. Seen by CCM. Their note was reviewed. Patient with alcohol cirrhosis, encephalopathy, possible seizure. CT head done now with nothing acute. No overt distress on camera.   eICU Interventions  Plan for lactulose  enema, thiamine , seizure wath, neuro follow up - AED per neuro. Reviewed CBC CMP and mild elevation in LA Call E link if needed     Intervention Category Major Interventions: Delirium, psychosis, severe agitation - evaluation and management Evaluation Type: New Patient Evaluation  Cheryll KANDICE Bang 10/20/2023, 9:02 PM

## 2023-10-20 NOTE — Assessment & Plan Note (Signed)
 Currently rate controlled and in sinus rhythm Historically patient is not on anticoagulation due to esophageal varices and high bleeding risk Monitoring on telemetry Pending repeat EKG

## 2023-10-21 ENCOUNTER — Inpatient Hospital Stay (HOSPITAL_COMMUNITY)

## 2023-10-21 DIAGNOSIS — K7682 Hepatic encephalopathy: Secondary | ICD-10-CM | POA: Diagnosis not present

## 2023-10-21 DIAGNOSIS — R4182 Altered mental status, unspecified: Secondary | ICD-10-CM | POA: Diagnosis not present

## 2023-10-21 DIAGNOSIS — E7220 Disorder of urea cycle metabolism, unspecified: Secondary | ICD-10-CM | POA: Diagnosis not present

## 2023-10-21 DIAGNOSIS — K7030 Alcoholic cirrhosis of liver without ascites: Secondary | ICD-10-CM | POA: Diagnosis not present

## 2023-10-21 DIAGNOSIS — E8721 Acute metabolic acidosis: Secondary | ICD-10-CM | POA: Diagnosis not present

## 2023-10-21 DIAGNOSIS — R569 Unspecified convulsions: Secondary | ICD-10-CM

## 2023-10-21 LAB — COMPREHENSIVE METABOLIC PANEL WITH GFR
ALT: 5 U/L (ref 0–44)
AST: 56 U/L — ABNORMAL HIGH (ref 15–41)
Albumin: 2 g/dL — ABNORMAL LOW (ref 3.5–5.0)
Alkaline Phosphatase: 139 U/L — ABNORMAL HIGH (ref 38–126)
Anion gap: 13 (ref 5–15)
BUN: 33 mg/dL — ABNORMAL HIGH (ref 6–20)
CO2: 16 mmol/L — ABNORMAL LOW (ref 22–32)
Calcium: 8.8 mg/dL — ABNORMAL LOW (ref 8.9–10.3)
Chloride: 105 mmol/L (ref 98–111)
Creatinine, Ser: 1.88 mg/dL — ABNORMAL HIGH (ref 0.61–1.24)
GFR, Estimated: 40 mL/min — ABNORMAL LOW (ref >60)
Glucose, Bld: 88 mg/dL (ref 70–99)
Potassium: 4.3 mmol/L (ref 3.5–5.1)
Sodium: 134 mmol/L — ABNORMAL LOW (ref 135–145)
Total Bilirubin: 4.5 mg/dL — ABNORMAL HIGH (ref 0.0–1.2)
Total Protein: 5.9 g/dL — ABNORMAL LOW (ref 6.5–8.1)

## 2023-10-21 LAB — BLOOD CULTURE ID PANEL (REFLEXED) - BCID2

## 2023-10-21 LAB — PROTIME-INR
INR: 2.6 — ABNORMAL HIGH (ref 0.8–1.2)
INR: 2.5 — ABNORMAL HIGH (ref 0.8–1.2)
Prothrombin Time: 29 s — ABNORMAL HIGH (ref 11.4–15.2)
Prothrombin Time: 28.4 s — ABNORMAL HIGH (ref 11.4–15.2)

## 2023-10-21 LAB — BLOOD GAS, VENOUS
Acid-base deficit: 6.4 mmol/L — ABNORMAL HIGH (ref 0.0–2.0)
Bicarbonate: 16.3 mmol/L — ABNORMAL LOW (ref 20.0–28.0)
Drawn by: 72402
O2 Saturation: 87.2 %
Patient temperature: 36.6
pCO2, Ven: 24 mmHg — ABNORMAL LOW (ref 44–60)
pH, Ven: 7.45 — ABNORMAL HIGH (ref 7.25–7.43)
pO2, Ven: 51 mmHg — ABNORMAL HIGH (ref 32–45)

## 2023-10-21 LAB — CBC
HCT: 32.9 % — ABNORMAL LOW (ref 39.0–52.0)
Hemoglobin: 11.7 g/dL — ABNORMAL LOW (ref 13.0–17.0)
MCH: 31.6 pg (ref 26.0–34.0)
MCHC: 35.6 g/dL (ref 30.0–36.0)
MCV: 88.9 fL (ref 80.0–100.0)
Platelets: 23 K/uL — CL (ref 150–400)
RBC: 3.7 MIL/uL — ABNORMAL LOW (ref 4.22–5.81)
RDW: 20 % — ABNORMAL HIGH (ref 11.5–15.5)
WBC: 4.9 K/uL (ref 4.0–10.5)
nRBC: 0 % (ref 0.0–0.2)

## 2023-10-21 LAB — AMMONIA
Ammonia: 132 umol/L — ABNORMAL HIGH (ref 9–35)
Ammonia: 86 umol/L — ABNORMAL HIGH (ref 9–35)
Ammonia: 121 umol/L — ABNORMAL HIGH (ref 9–35)
Ammonia: 110 umol/L — ABNORMAL HIGH (ref 9–35)

## 2023-10-21 LAB — FIBRINOGEN: Fibrinogen: 131 mg/dL — ABNORMAL LOW (ref 210–475)

## 2023-10-21 LAB — ABO/RH: ABO/RH(D): A POS

## 2023-10-21 LAB — GLUCOSE, CAPILLARY
Glucose-Capillary: 92 mg/dL (ref 70–99)
Glucose-Capillary: 96 mg/dL (ref 70–99)

## 2023-10-21 MED ORDER — ACETAZOLAMIDE SODIUM 500 MG IJ SOLR
500.0000 mg | Freq: Once | INTRAMUSCULAR | Status: AC
  Administered 2023-10-21: 500 mg via INTRAVENOUS
  Filled 2023-10-21: qty 500

## 2023-10-21 MED ORDER — PROSOURCE TF20 ENFIT COMPATIBL EN LIQD
60.0000 mL | Freq: Two times a day (BID) | ENTERAL | Status: AC
Start: 2023-10-21 — End: ?
  Administered 2023-10-21 – 2023-10-23 (×4): 60 mL
  Filled 2023-10-21 (×4): qty 60

## 2023-10-21 MED ORDER — LEVOTHYROXINE SODIUM 25 MCG PO TABS
125.0000 ug | ORAL_TABLET | Freq: Every day | ORAL | Status: DC
  Administered 2023-10-22 – 2023-10-23 (×2): 125 ug
  Filled 2023-10-21 (×2): qty 1

## 2023-10-21 MED ORDER — SODIUM CHLORIDE 0.9 % IV SOLN
2.0000 g | Freq: Three times a day (TID) | INTRAVENOUS | Status: DC
  Administered 2023-10-21 – 2023-10-25 (×12): 2 g via INTRAVENOUS
  Filled 2023-10-21 (×12): qty 12.5

## 2023-10-21 MED ORDER — LACTULOSE 10 GM/15ML PO SOLN
30.0000 g | Freq: Three times a day (TID) | ORAL | Status: DC
  Administered 2023-10-21 – 2023-10-22 (×2): 30 g
  Filled 2023-10-21 (×2): qty 45

## 2023-10-21 MED ORDER — SODIUM CHLORIDE 0.9% IV SOLUTION
Freq: Once | INTRAVENOUS | Status: AC
Start: 2023-10-21 — End: 2023-10-21

## 2023-10-21 MED ORDER — OSMOLITE 1.5 CAL PO LIQD
1000.0000 mL | ORAL | Status: DC
  Administered 2023-10-21 – 2023-10-23 (×2): 1000 mL
  Filled 2023-10-21 (×4): qty 1000

## 2023-10-21 NOTE — Progress Notes (Signed)
 Attempted to call all three numbers for phlebotomy again ( [phone], [phone], [phone]) in attempt to have ammonia lab drawn. Once again, no response.

## 2023-10-21 NOTE — Progress Notes (Signed)
 Initial Nutrition Assessment  DOCUMENTATION CODES:   Morbid obesity  INTERVENTION:   When Cortrak placed, initiate tube feeding: Osmolite 1.5 at 65 ml/h (1560 ml per day) Prosource TF20 60 ml BID Provides 2500 kcal, 138 gm protein, 1189 ml free water  daily  MVI with minerals daily via tube.  NUTRITION DIAGNOSIS:   Inadequate oral intake related to inability to eat as evidenced by NPO status.  GOAL:   Patient will meet greater than or equal to 90% of their needs  MONITOR:   TF tolerance, Skin, Diet advancement, PO intake  REASON FOR ASSESSMENT:   New TF, Other (Comment) (New Cortrak)    ASSESSMENT:   58 yo male admitted with AMS, hepatic encephalopathy. PMH includes gout, RA, COPD, A fib, CHF, neuropathy in hands and feet, ETOH cirrhosis, esophageal varices, portal vein thrombosis, GERD.  Patient unable to converse with RD; unable to obtain any nutrition hx at this time. He moved around in bed, but did not wake up when name called and during NFPE. Patient has a lot of dried blood around his mouth.   Patient has been NPO since admission, not alert enough to safely take POs. Plans for Cortrak placement today, but INR 2.5 and platelets 23. Platelets to be given prior to Cortrak placement per discussion with MD.   Labs reviewed.  Na 134 BUN 33 Creatinine 1.88 Calcium 8.8, corrects to 10.4 with albumin =2  Medications reviewed and include vitamin B12, solu-cortef , lactulose , protonix , thiamine .  Weight history reviewed. Patient with some weight loss noted, however, unable to determine actual dry weight loss with ongoing edema.   NUTRITION - FOCUSED PHYSICAL EXAM:  Flowsheet Row Most Recent Value  Orbital Region No depletion  Upper Arm Region Unable to assess  Thoracic and Lumbar Region No depletion  Buccal Region No depletion  Temple Region No depletion  Clavicle Bone Region No depletion  Clavicle and Acromion Bone Region No depletion  Scapular Bone Region Unable  to assess  Dorsal Hand No depletion  Patellar Region No depletion  Anterior Thigh Region No depletion  Posterior Calf Region No depletion  Edema (RD Assessment) Mild  [BLE]  Hair Reviewed  Eyes Unable to assess  Mouth Unable to assess  Skin Reviewed  Nails Reviewed    Diet Order:   Diet Order             Diet NPO time specified Except for: Ice Chips, Sips with Meds  Diet effective now                   EDUCATION NEEDS:   Not appropriate for education at this time  Skin:  Skin Assessment: Skin Integrity Issues: Skin Integrity Issues:: Stage I, Stage III, Other (Comment), Unstageable Stage I: sacrum Stage III: L & R buttocks Unstageable: R heel Other: vascular ulcer R ankle; irritant contact dermatitis bilateral buttocks  Last BM:  10/13 type 7  Height:   Ht Readings from Last 1 Encounters:  10/19/23 6' (1.829 m)    Weight:   Wt Readings from Last 1 Encounters:  10/19/23 (!) 161 kg    Ideal Body Weight:  80.9 kg  BMI:  Body mass index is 48.15 kg/m.  Estimated Nutritional Needs:   Kcal:  2400-2600  Protein:  125-150 gm  Fluid:  2-2.2 L   Suzen HUNT RD, LDN, CNSC Contact via secure chat. If unavailable, use group chat RD Inpatient.

## 2023-10-21 NOTE — Progress Notes (Signed)
 RT obtained ABG, sample contained brackets pH 7.64 pCO2 <15  PO2 120   Reported results to CCM. RT explained gas would need to be redrawn because of brackets on sample, and no blood left to send to lab due to patient being a difficult stick and thrashing in bed. Per CCM no repeat gas necessary due to pH and pCO2 being consistent with last drawn ABG.

## 2023-10-21 NOTE — Progress Notes (Signed)
 PHARMACY - PHYSICIAN COMMUNICATION CRITICAL VALUE ALERT - BLOOD CULTURE IDENTIFICATION (BCID)  [name] is an 58 y.o. male who presented to [facility] on 10/19/2023 with a chief complaint of altered mental status.  Assessment:  Patient with hx MSSA bacteremia/endocarditis being treated with Cefazolin  now with 4 of 4 blood cultures growing Pseudomonas.  Name of physician (or Provider) Contacted: Drs. Mannam and Manandhar  Current antibiotics: Cefazolin   Changes to prescribed antibiotics recommended:  Recommendations accepted by provider - change to Cefepime to treat both MSSA and Pseudomonas  Results for orders placed or performed during the hospital encounter of 10/19/23  Blood Culture ID Panel (Reflexed) (Collected: 10/20/2023  9:33 PM)  Result Value Ref Range   Enterococcus faecalis NOT DETECTED NOT DETECTED   Enterococcus Faecium NOT DETECTED NOT DETECTED   Listeria monocytogenes NOT DETECTED NOT DETECTED   Staphylococcus species NOT DETECTED NOT DETECTED   Staphylococcus aureus (BCID) NOT DETECTED NOT DETECTED   Staphylococcus epidermidis NOT DETECTED NOT DETECTED   Staphylococcus lugdunensis NOT DETECTED NOT DETECTED   Streptococcus species NOT DETECTED NOT DETECTED   Streptococcus agalactiae NOT DETECTED NOT DETECTED   Streptococcus pneumoniae NOT DETECTED NOT DETECTED   Streptococcus pyogenes NOT DETECTED NOT DETECTED   A.calcoaceticus-baumannii NOT DETECTED NOT DETECTED   Bacteroides fragilis NOT DETECTED NOT DETECTED   Enterobacterales NOT DETECTED NOT DETECTED   Enterobacter cloacae complex NOT DETECTED NOT DETECTED   Escherichia coli NOT DETECTED NOT DETECTED   Klebsiella aerogenes NOT DETECTED NOT DETECTED   Klebsiella oxytoca NOT DETECTED NOT DETECTED   Klebsiella pneumoniae NOT DETECTED NOT DETECTED   Proteus species NOT DETECTED NOT DETECTED   Salmonella species NOT DETECTED NOT DETECTED   Serratia marcescens NOT DETECTED NOT DETECTED   Haemophilus  influenzae NOT DETECTED NOT DETECTED   Neisseria meningitidis NOT DETECTED NOT DETECTED   Pseudomonas aeruginosa DETECTED (A) NOT DETECTED   Stenotrophomonas maltophilia NOT DETECTED NOT DETECTED   Candida albicans NOT DETECTED NOT DETECTED   Candida auris NOT DETECTED NOT DETECTED   Candida glabrata NOT DETECTED NOT DETECTED   Candida krusei NOT DETECTED NOT DETECTED   Candida parapsilosis NOT DETECTED NOT DETECTED   Candida tropicalis NOT DETECTED NOT DETECTED   Cryptococcus neoformans/gattii NOT DETECTED NOT DETECTED   CTX-M ESBL NOT DETECTED NOT DETECTED   Carbapenem resistance IMP NOT DETECTED NOT DETECTED   Carbapenem resistance KPC NOT DETECTED NOT DETECTED   Carbapenem resistance NDM NOT DETECTED NOT DETECTED   Carbapenem resistance VIM NOT DETECTED NOT DETECTED    Rocky Slade, PharmD, BCPS 10/21/2023  4:26 PM

## 2023-10-21 NOTE — Progress Notes (Signed)
 CRITICAL VALUE STICKER  CRITICAL VALUE: Platelet 23  RECEIVER (on-site recipient of call): Vernell RN   DATE & TIME NOTIFIED: 10/21/23 @ 0718  MESSENGER (representative from lab): see results review for name  MD NOTIFIED: Maree Harder, MD   TIME OF NOTIFICATION: 0719  RESPONSE: MD messaged in epic, MD acknowledges lab results.

## 2023-10-21 NOTE — Progress Notes (Signed)
 EEG maint complete. No sbd

## 2023-10-21 NOTE — Progress Notes (Signed)
 eLink Physician-Brief Progress Note Patient Name: [name] DOB: Jul 07, 1965 MRN: [id number]   Date of Service  10/21/2023  HPI/Events of Note  Notified that patient was getting lactulose  enema but now has a cortrak in place.   eICU Interventions  Change route of administration of lactulose  via tube TID.      Intervention Category Intermediate Interventions: Medication change / dose adjustment  [name] 10/21/2023, 8:35 PM  5:26 AM Notified by RN that the patient has not voided throughout the shift.   Plan> Perform straight cath now.

## 2023-10-21 NOTE — Progress Notes (Signed)
 Attempted 3 different phone numbers multiple times to get a hold of phlebotomy to ask them to please come draw ammonia lab. [phone], [phone], and [phone]. No response at any number. RN called main lab and asked for them to place me through to phlebotomy. Again, no response. Will attempt again at a later time.

## 2023-10-21 NOTE — Progress Notes (Signed)
 NAME:  [name], MRN:  [id number], DOB:  12-02-1965, LOS: 1 ADMISSION DATE:  10/19/2023, CONSULTATION DATE:  10/12  REFERRING MD: Al-Sultani CHIEF COMPLAINT:  Acute Metabolic Enceph/Worsening AMS   History of Present Illness:  Patient is a 58 year old male with a significant past medical history of cirrhosis complicated by mesenteric/portal vein thrombosis, thrombocytopenia, esophageal varices, ongoing history of hepatic encephalopathy ([facility] hospitalization at [facility] - 10/8 for acute hepatic encephalopathy in setting of AKI), recent suspicion of infective endocarditis/MSSA bacteremia-(09/2023, receiving IV cefazolin  until 11/12/2023), A-fib on no anticoagulation, diastolic congestive heart failure, hypothyroidism, adrenal insufficiency on hydrocortisone  who [facility] on [facility]11 from [facility] skilled nursing facility because of increased confusion and having right gaze preference.  Upon initial workup at [facility], patient had ammonia level of 247 with right gaze preference.  With these findings, teleneurology was consulted. Concern for seizure/stroke but patient not candidate for IV thrombolytics given hx of esophageal varices/thrombocytopenia. Patient was loaded with Keppra and transferred to to [facility] for further neurowork-up as well as EEG monitoring.  TRH consulted for admission.  10/12 patient pended for progressive bed and ultimately transferred to PCU.  Nursing staff can concerned with patient's worsening and ongoing acute encephalopathy.  LTM was reviewed by neurology concerned, originally showing diffuse slowing but around noon patient developed burst suppression pattern but on no burst suppression therapy.  Neurology recommended stat CT of head due to severe low platelet count of 29,000. PCCM consulted to assist with management and transfer patient to ICU for further neuro critical care monitoring.  Upon assessment, patient obtunded,  still having right gaze preference.  Also, patient withdrawing from pain in all 4 extremities.  Patient protecting airway at this time, O2 sats 99% on room air, appears to have dried old blood around oral mucosa.  Chest x-ray obtained, showing no evidence of pneumonia or aspiration at this time.  Placed stat CT of head order, ABG stat, T3/T4, lactic acid, CBC, and CMP.  Notified rapid response RN in regards to ICU transfer as well as stat CT of head to evaluate for intracranial abnormality/cerebral edema.  Pertinent  Medical History   Past Medical History:  Diagnosis Date   Anxiety    Atrial fibrillation (HCC)    Blood clot in vein    blood clot in portal vein   CHF (congestive heart failure) (HCC)    Cirrhosis (HCC)    NASH   Constipation    COPD (chronic obstructive pulmonary disease) (HCC)    Dysrhythmia    GERD (gastroesophageal reflux disease)    Gout    Leukopenia 07/08/2015   Neuromuscular disorder (HCC)    neuropathy in hands and feet   Psoriasis    RA (rheumatoid arthritis) (HCC)    Sleep apnea    cpap used- level 10 and greater   Thrombocytopenia      Significant Hospital Events: Including procedures, antibiotic start and stop dates in addition to other pertinent events   10/11 admitted for acute hepatic/metabolic encephalopathy 10/12 PCCM consulted for ongoing worsening acute metabolic encephalopathy, hyperammonemia, stat CT of head ordered 10/13 CTH without acute findings, still encephalopathic   Interim History / Subjective:  Remains encephalopathic but is protecting his airway Dried blood around oral mucosa, but no active bleeding, platelets 23k   Objective    Blood pressure 109/88, pulse 78, temperature (!) 96.4 F (35.8 C), temperature source Axillary, resp. rate 14, height 6' (1.829 m), weight ROLLEN)  161 kg, SpO2 98%.        Intake/Output Summary (Last 24 hours) at 10/21/2023 0747 Last data filed at 10/21/2023 0700 Gross per 24 hour  Intake 3421.43 ml   Output 2050 ml  Net 1371.43 ml   Filed Weights   10/19/23 2145  Weight: (!) 161 kg     General:  ill appearing obese M sleeping in bed in NAD HEENT: MM pink/moist, dried blood around oral mucosa, sclera icteric, downward gaze Neuro:  does not open eyes to voice or sternal rub, grimaces and withdraws to stimulation and pain, not following commands  CV: s1s2 currently sinus, no m/r/g PULM:  no tachypnea or distress on RA, diminished in the bilateral bases  GI: soft, distended, peri-umbilical hernia Extremities: warm/dry, ecchymosis bilateral lower extremities with  edema    Labs  Ammonia 247>172>132 Bili 4.5 Platelets 23k Creatinine 1.88 Bicarb 16 ABG 7.64/<15/120    Resolved problem list   Assessment and Plan    Acute hepatic encephalopathy in setting of alcoholic cirrhosis Acute metabolic encephalopathy Anion GAP Metabolic Acidosis  Hyperammonemia-improving History of esophageal varices Recent admission at [facility] 10/2 - 10/8, ammonia level noted to be 44 MELD 3.0: 30 at 10/20/2023   Imaging without acute findings  -Continue lactulose  enema regimen, goal 5 stools/day, trend ammonia -Continue EEG monitoring via LTM  -neuro ordered repeat CTA head and neck -Continue Keppra IV, Continue seizure precautions -Continue delirium and aspiration precautions  -Serial neurochecks -euglycemia, euvolemia, normothermia, trend electrolytes- maintain WNL  -Continue High dose thiamine  and folic acid  admin    Respiratory Alkalosis  -diamox 500mg  x1 -repeat ABG   AKI -creatinine stable 1.8, 1.5L UOP yesterday -trend renal indices, avoid nephrotoxins as able  -leave purewick  Thrombocytopenia Plts 38>29>31>23 Hgb stable  -needs cortrak, will transfuse one unit, unable to reach family or guardian for consent  -continue to monitor for signs of continued bleeding -check INR and Fibrinogen today   Infective endocarditis Hx of diastolic CHF ECHO 9/23 EF 70-75%  hyperdynamic function, NRWA, RV systolic function normal  Suspected in 9/25 of this year due to MSSA bactermia  TEE unable to be obtained due to hx of esophageal varices -Continue Cefazolin  until 11/12/23  -appears volume overloaded, stop maintenance IVF and give diamox, monitor UOP, may need lasix     Hypothyroidism TSH elevated in past (09/27/23-45.347), currently 16.300, FT4 0.80 -continue IV synthroid  until can transition to po  Adrenal insufficiency -Check cortisol level  -Continue IV solucortef 20mg  q12hrs   Proximal A-fib Not on any anticoags due to ongoing thrombocytopenia  Hx of esophageal varices  -Continue monitor on cardiac tele   Nutrition-attempt to place cortrak and start TF DVT prophylaxis-no chemical due to risk of bleeding, no SCD's with ecchymosis of LE   Labs   CBC: Recent Labs  Lab 10/16/23 0522 10/19/23 2144 10/20/23 0923 10/20/23 1916 10/21/23 0550  WBC 7.9 7.4 5.7 8.2 4.9  NEUTROABS  --  6.1  --  7.4  --   HGB 12.4* 11.3* 11.1* 12.1* 11.7*  HCT 37.1* 33.6* 32.3* 35.2* 32.9*  MCV 92.3 92.3 91.0 91.7 88.9  PLT 42* 38* 29* 31* 23*    Basic Metabolic Panel: Recent Labs  Lab 10/15/23 0516 10/16/23 0522 10/19/23 2144 10/20/23 1044 10/20/23 1916  NA 133* 130* 131* 132* 129*  K 3.2* 3.3* 4.2 4.4 5.2*  CL 101 99 100 100 101  CO2 17* 18* 19* 16* 16*  GLUCOSE 114* 109* 93 92 94  BUN 29*  25* 31* 32* 34*  CREATININE 1.51* 1.26* 1.89* 1.81* 1.91*  CALCIUM 9.0 9.1 9.0 8.4* 8.7*  MG  --   --  2.1  --   --    GFR: Estimated Creatinine Clearance: 64.6 mL/min (A) (by C-G formula based on SCr of 1.91 mg/dL (H)). Recent Labs  Lab 10/19/23 2144 10/20/23 0923 10/20/23 1916 10/20/23 1948 10/20/23 2133 10/21/23 0550  WBC 7.4 5.7 8.2  --   --  4.9  LATICACIDVEN  --   --   --  2.5* 2.8*  --     Liver Function Tests: Recent Labs  Lab 10/19/23 2144 10/20/23 1044 10/20/23 1916  AST 63* 57* 62*  ALT 7 <5 6  ALKPHOS 168* 143* 152*  BILITOT 3.6*  4.3* 4.7*  PROT 6.0* 5.7* 6.3*  ALBUMIN  2.6* 2.0* 2.2*   No results for input(s): LIPASE, AMYLASE in the last 168 hours. Recent Labs  Lab 10/15/23 0516 10/16/23 0522 10/19/23 2152 10/20/23 0923 10/21/23 0550  AMMONIA 38* 44* 247* 172* 132*    ABG    Component Value Date/Time   PHART 7.54 (H) 10/20/2023 2030   PCO2ART 19 (LL) 10/20/2023 2030   PO2ART 93 10/20/2023 2030   HCO3 16.7 (L) 10/20/2023 2030   TCO2 21 04/17/2007 1935   ACIDBASEDEF 3.7 (H) 10/20/2023 2030   O2SAT 99.4 10/20/2023 2030     Coagulation Profile: Recent Labs  Lab 10/19/23 2144 10/20/23 1916 10/21/23 0550  INR 2.3* 2.3* 2.6*    Cardiac Enzymes: No results for input(s): CKTOTAL, CKMB, CKMBINDEX, TROPONINI in the last 168 hours.  HbA1C: HB A1C (BAYER DCA - WAIVED)  Date/Time Value Ref Range Status  11/16/2019 11:54 AM 4.9 <7.0 % Final    Comment:                                          Diabetic Adult            <7.0                                       Healthy Adult        4.3 - 5.7                                                           (DCCT/NGSP) American Diabetes Association's Summary of Glycemic Recommendations for Adults with Diabetes: Hemoglobin A1c <7.0%. More stringent glycemic goals (A1c <6.0%) may further reduce complications at the cost of increased risk of hypoglycemia.   02/17/2018 10:26 AM 6.0 <7.0 % Final    Comment:                                          Diabetic Adult            <7.0  Healthy Adult        4.3 - 5.7                                                           (DCCT/NGSP) American Diabetes Association's Summary of Glycemic Recommendations for Adults with Diabetes: Hemoglobin A1c <7.0%. More stringent glycemic goals (A1c <6.0%) may further reduce complications at the cost of increased risk of hypoglycemia.    Hgb A1c MFr Bld  Date/Time Value Ref Range Status  05/15/2019 04:29 AM 6.2 (H) 4.8 - 5.6 % Final     Comment:    (NOTE) Pre diabetes:          5.7%-6.4% Diabetes:              >6.4% Glycemic control for   <7.0% adults with diabetes   04/22/2019 02:10 AM 5.7 (H) 4.8 - 5.6 % Final    Comment:    (NOTE)         Prediabetes: 5.7 - 6.4         Diabetes: >6.4         Glycemic control for adults with diabetes: <7.0     CBG: No results for input(s): GLUCAP in the last 168 hours.  Review of Systems:   See HPI   Past Medical History:  He,  has a past medical history of Anxiety, Atrial fibrillation (HCC), Blood clot in vein, CHF (congestive heart failure) (HCC), Cirrhosis (HCC), Constipation, COPD (chronic obstructive pulmonary disease) (HCC), Dysrhythmia, GERD (gastroesophageal reflux disease), Gout, Leukopenia (07/08/2015), Neuromuscular disorder (HCC), Psoriasis, RA (rheumatoid arthritis) (HCC), Sleep apnea, and Thrombocytopenia.   Surgical History:   Past Surgical History:  Procedure Laterality Date   ANKLE SURGERY     right ankle talor repair   CHOLECYSTECTOMY  sept 2016   CHOLECYSTECTOMY     COLONOSCOPY  05/08/2011   Procedure: COLONOSCOPY;  Surgeon: Claudis RAYMOND Rivet, MD;  Location: [facility];  Service: Endoscopy;  Laterality: N/A;  730   COLONOSCOPY WITH PROPOFOL  N/A 05/27/2020   Procedure: COLONOSCOPY WITH PROPOFOL ;  Surgeon: Eartha Angelia Sieving, MD;  Location: [facility];  Service: Gastroenterology;  Laterality: N/A;  Patient needs a unit of platelets prior to procedure.   ESOPHAGEAL BANDING N/A 05/27/2020   Procedure: ESOPHAGEAL BANDING;  Surgeon: Eartha Angelia Sieving, MD;  Location: [facility];  Service: Gastroenterology;  Laterality: N/A;   ESOPHAGOGASTRODUODENOSCOPY  02/28/2011   Procedure: ESOPHAGOGASTRODUODENOSCOPY (EGD);  Surgeon: Claudis RAYMOND Rivet, MD;  [facility];  Service: Endoscopy;  Laterality: N/A;  1200   ESOPHAGOGASTRODUODENOSCOPY N/A 06/11/2012   Procedure: ESOPHAGOGASTRODUODENOSCOPY (EGD);  Surgeon: Claudis RAYMOND Rivet, MD;   Location[facility];  Service: Endoscopy;  Laterality: N/A;  1200  FYI patient is 400 pounds   ESOPHAGOGASTRODUODENOSCOPY (EGD) WITH PROPOFOL  N/A 04/21/2014   Procedure: ESOPHAGOGASTRODUODENOSCOPY (EGD) WITH PROPOFOL ;  Surgeon: Claudis RAYMOND Rivet, MD;  Location: [facility];  Service: Endoscopy;  Laterality: N/A;   ESOPHAGOGASTRODUODENOSCOPY (EGD) WITH PROPOFOL  N/A 05/27/2020   Procedure: ESOPHAGOGASTRODUODENOSCOPY (EGD) WITH PROPOFOL ;  Surgeon: Eartha Angelia Sieving, MD;  [facility];  Service: Gastroenterology;  Laterality: N/A;  7:30 am   ESOPHAGOGASTRODUODENOSCOPY (EGD) WITH PROPOFOL  N/A 07/29/2020   Procedure: ESOPHAGOGASTRODUODENOSCOPY (EGD) WITH PROPOFOL ;  Surgeon: Eartha Angelia Sieving, MD;  [facility];  Service: Gastroenterology;  Laterality: N/A;  8:20   ESOPHAGOGASTRODUODENOSCOPY (EGD) WITH PROPOFOL  N/A 07/31/2022   Procedure: ESOPHAGOGASTRODUODENOSCOPY (EGD) WITH PROPOFOL ;  Surgeon: Eartha Angelia Sieving, MD;  [facility];  Service: Gastroenterology;  Laterality: N/A;  9:30am;asa 3   HERNIA REPAIR Right    as child; inguinal   HERNIA REPAIR  sept 2016   LIVER BIOPSY  2012   SCIATIC NERVE EXPLORATION     TONSILLECTOMY       Social History:   reports that he has never smoked. His smokeless tobacco use includes snuff and chew. He reports current alcohol use of about 4.0 standard drinks of alcohol per week. He reports that he does not use drugs.   Family History:  His family history includes Cancer in his brother, father, mother, and sister; Colon cancer in his mother. There is no history of Heart attack or Stroke.   Allergies Allergies  Allergen Reactions   Allopurinol  Other (See Comments)    Joint pain worse Caused worsening gout   Fluoxetine Hcl Itching     Home Medications  Prior to Admission medications   Medication Sig Start Date End Date Taking? Authorizing Provider  acetaminophen  (TYLENOL ) 325 MG tablet Take 650 mg by mouth  every 8 (eight) hours as needed for mild pain (pain score 1-3).   Yes [provider]  albuterol  (VENTOLIN  HFA) 108 (90 Base) MCG/ACT inhaler INHALE 2 PUFFS BY MOUTH EVERY 6 HOURS AS NEEDED FOR SHORTNESS OF BREATH OR WHEEZING 12/04/22  Yes Jude Harden GAILS, MD  aspirin  EC 325 MG tablet Take 162.5 mg by mouth daily as needed (anticoagulant).   Yes [provider]  ceFAZolin  (ANCEF ) IVPB Inject 2 g into the vein every 8 (eight) hours. Indication:  MSSA bacteremia First Dose: Yes Last Day of Therapy:  11/12/23 Labs - Once weekly:  CBC/D and CMP Pull PICC line at the completion of IV antibiotic therapy Method of administration: IV Push or per SNF protocol Method of administration may be changed at the discretion of home infusion pharmacist based upon assessment of the patient and/or caregiver's ability to self-administer the medication ordered. 10/05/23 11/14/23 Yes Maree, Pratik D, DO  cyanocobalamin  (V-R VITAMIN B-12) 500 MCG tablet Take 1 tablet (500 mcg total) by mouth every Monday, Wednesday, and Friday. 06/12/23  Yes Pennington, Rebekah M, PA-C  ezetimibe  (ZETIA ) 10 MG tablet Take 1 tablet (10 mg total) by mouth daily. 11/11/19  Yes Dettinger, Fonda LABOR, MD  fluticasone  (FLONASE ) 50 MCG/ACT nasal spray USE 2 SPRAYS IN EACH NOSTRIL EVERY DAY AS NEEDED 01/15/20  Yes Dettinger, Fonda LABOR, MD  folic acid  (FOLVITE ) 800 MCG tablet Take 1 tablet (800 mcg total) by mouth daily. 06/12/23  Yes Pennington, Rebekah M, PA-C  Heparin  Sod, Pork, Lock Flush (HEPARIN  LOCK FLUSH IV) Inject 3 mLs into the vein every 8 (eight) hours. Use 3 mL intravenously every 8 hours for flush of non-valved catheter lumens after med admin. Flush with 10 mL of normal saline before med; 10 mL normal saline after med; followed by heparin .   Yes [provider]  hydrocortisone  (CORTEF ) 20 MG tablet Take 1 tablet (20 mg total) by mouth 2 (two) times daily. 10/05/23 11/04/23 Yes Shah, Pratik D, DO  lactulose  (CONSTULOSE ) 10  GM/15ML solution TAKE 30 ML BY MOUTH TWICE DAILY Patient taking differently: Take 45 mLs by mouth 3 (three) times daily. 10/24/22  Yes Carlan, Chelsea L, NP  levothyroxine  (SYNTHROID ) 125 MCG tablet Take 1 tablet (125 mcg total) by mouth daily  at 6 (six) AM. 10/06/23 11/05/23 Yes Maree, Pratik D, DO  metoprolol  succinate (TOPROL -XL) 100 MG 24 hr tablet Take 0.5 tablets (50 mg total) by mouth in the morning and at bedtime. 10/16/23  Yes Darci Pore, MD  midodrine (PROAMATINE) 5 MG tablet Take 5 mg by mouth 2 (two) times daily with a meal.   Yes [provider]  Nutritional Supplements (,FEEDING SUPPLEMENT, PROSOURCE PLUS) liquid Take 30 mLs by mouth 3 (three) times daily between meals. 10/16/23  Yes Darci Pore, MD  pantoprazole  (PROTONIX ) 40 MG tablet TAKE 1 TABLET BY MOUTH TWICE DAILY 09/04/23  Yes Castaneda Mayorga, Toribio, MD  potassium chloride  SA (KLOR-CON  M) 20 MEQ tablet Take 20 mEq by mouth daily.   Yes [provider]  rosuvastatin (CRESTOR) 5 MG tablet Take 5 mg by mouth daily.   Yes [provider]  Sodium Chloride  Flush (NORMAL SALINE FLUSH IV) Inject 10 mLs into the vein every 8 (eight) hours.   Yes [provider]  thiamine  (VITAMIN B1) 100 MG tablet Take 100 mg by mouth daily.   Yes [provider]  ZINC OXIDE, TOPICAL, 10 % CREA Apply 1 Application topically in the morning and at bedtime. Apply to reddened area to groin/scrotum for MASD   Yes [provider]  OneTouch Delica Lancets 30G MISC Test BS QID Dx E11.9 05/27/19   Dettinger, Fonda LABOR, MD  spironolactone  (ALDACTONE ) 50 MG tablet Take 1 tablet (50 mg total) by mouth daily. Patient not taking: Reported on 10/20/2023 09/12/21   Lavona Agent, MD  torsemide  (DEMADEX ) 20 MG tablet Take 20 mg by mouth daily. Patient not taking: Reported on 10/20/2023    [provider]     Critical care time: 45 mins     CRITICAL CARE Performed by: Leita SAUNDERS  Collin Rengel   Total critical care time: 45 minutes  Critical care time was exclusive of separately billable procedures and treating other patients.  Critical care was necessary to treat or prevent imminent or life-threatening deterioration.  Critical care was time spent personally by me on the following activities: development of treatment plan with patient and/or surrogate as well as nursing, discussions with consultants, evaluation of patient's response to treatment, examination of patient, obtaining history from patient or surrogate, ordering and performing treatments and interventions, ordering and review of laboratory studies, ordering and review of radiographic studies, pulse oximetry and re-evaluation of patient's condition.   Leita SAUNDERS Bilaal Leib, PA-C [facility] See Amion for pager If no response to pager , please call [phone] until 7pm After 7:00 pm call Elink  [phone]

## 2023-10-21 NOTE — Progress Notes (Signed)
 NEUROLOGY CONSULT FOLLOW UP NOTE   Date of service: October 21, 2023 Patient Name: [name] MRN:  [id number] DOB:  [birth year]  Interval Hx/subjective   No change in clinical exam per nursing.  Ammonia improved from 247 on 10/11 to 132 this morning.  No seizure activity on EEG overnight.  Vitals   Vitals:   10/21/23 1700 10/21/23 1900 10/21/23 2000 10/21/23 2100  BP: 126/72 122/77 102/62 100/66  Pulse: 76 76 76 79  Resp: 10 12 10 11   Temp: (!) 96.7 F (35.9 C)  97.9 F (36.6 C)   TempSrc: Axillary  Axillary   SpO2: 96% 95% 95% 95%  Weight:      Height:         Body mass index is 48.15 kg/m.  Physical Exam   Constitutional: Appears obese Eyes: No scleral injection. Icterus present. HENT: No OP obstrucion.  Head: Normocephalic.  Cardiovascular: Normal rate and regular rhythm.  Respiratory: Effort normal, non-labored breathing.  GI: Distended. Skin: Bruising and scabbing throughout limbs.   Neurologic Examination   Mental status: Does not open eyes to voice or tactile stimulus. Speech: Mute Cranial nerves: PERRL Conjugate gaze looking to his left initially that then roves to the right. Face symmetric at rest and with activation. Motor: No abnormal movements RUE: withdraws to noxious LUE: withdraws to noxious RLE: withdraws to noxious LLE: withdraws to noxious Sensory: As above Reflexes: DTR's 1+ throughout. Babinski downgoing bilaterally. Coordination: UTA Gait: UTA   Medications  Current Facility-Administered Medications:    acetaminophen  (TYLENOL ) tablet 650 mg, 650 mg, Oral, Q6H PRN **OR** acetaminophen  (TYLENOL ) suppository 650 mg, 650 mg, Rectal, Q6H PRN, Shalhoub, Zachary PARAS, MD   albuterol  (PROVENTIL ) (2.5 MG/3ML) 0.083% nebulizer solution 2.5 mg, 2.5 mg, Nebulization, Q4H PRN, Shalhoub, George J, MD   ceFEPIme (MAXIPIME) 2 g in sodium chloride  0.9 % 100 mL IVPB, 2 g, Intravenous, Q8H, Manandhar, Sabina, MD, Stopped at 10/21/23 1809    Chlorhexidine  Gluconate Cloth 2 % PADS 6 each, 6 each, Topical, Daily, Maree Harder, MD, 6 each at 10/21/23 0600   collagenase (SANTYL) ointment, , Topical, Daily, Maree Harder, MD   feeding supplement (OSMOLITE 1.5 CAL) liquid 1,000 mL, 1,000 mL, Per Tube, Continuous, Mannam, Praveen, MD, Last Rate: 10 mL/hr at 10/21/23 2100, Infusion Verify at 10/21/23 2100   feeding supplement (PROSource TF20) liquid 60 mL, 60 mL, Per Tube, BID, Mannam, Praveen, MD   fluticasone  (FLONASE ) 50 MCG/ACT nasal spray 2 spray, 2 spray, Each Nare, Daily PRN, Shalhoub, Zachary PARAS, MD   Gerhardt's butt cream, , Topical, TID, Maree Harder, MD, Given at 10/21/23 1703   hydrocerin (EUCERIN) cream, , Topical, BID, Maree Harder, MD, Given at 10/21/23 0947   hydrocortisone  sodium succinate  (SOLU-CORTEF ) 100 MG injection 20 mg, 20 mg, Intravenous, Q12H, Shalhoub, Zachary PARAS, MD, 20 mg at 10/21/23 1703   lactulose  (CHRONULAC ) 10 GM/15ML solution 30 g, 30 g, Per Tube, TID, Yap, Vanessa, MD   levETIRAcetam (KEPPRA) undiluted injection 500 mg, 500 mg, Intravenous, Q12H, Shalhoub, Zachary PARAS, MD, 500 mg at 10/21/23 0948   [START ON 10/22/2023] levothyroxine  (SYNTHROID ) tablet 125 mcg, 125 mcg, Per Tube, Q0600, Gleason, Leita SAUNDERS, PA-C   mupirocin ointment (BACTROBAN) 2 % 1 Application, 1 Application, Nasal, BID, Maree Harder, MD, 1 Application at 10/21/23 9052   Oral care mouth rinse, 15 mL, Mouth Rinse, PRN, Maree Harder, MD, 15 mL at 10/20/23 2233   pantoprazole  (PROTONIX ) injection 40 mg, 40 mg, Intravenous, Q12H, Shalhoub, Zachary PARAS, MD,  40 mg at 10/21/23 0948   thiamine  (VITAMIN B1) 500 mg in sodium chloride  0.9 % 50 mL IVPB, 500 mg, Intravenous, Q8H, Stopped at 10/21/23 1734 **FOLLOWED BY** [START ON 10/22/2023] thiamine  (VITAMIN B1) 250 mg in sodium chloride  0.9 % 50 mL IVPB, 250 mg, Intravenous, Daily **FOLLOWED BY** [START ON 10/28/2023] thiamine  (VITAMIN B1) injection 100 mg, 100 mg, Intravenous, Daily, Khaliqdina, Salman, MD  Labs  and Diagnostic Imaging   CBC:  Recent Labs  Lab 10/19/23 2144 10/20/23 0923 10/20/23 1916 10/21/23 0550  WBC 7.4   < > 8.2 4.9  NEUTROABS 6.1  --  7.4  --   HGB 11.3*   < > 12.1* 11.7*  HCT 33.6*   < > 35.2* 32.9*  MCV 92.3   < > 91.7 88.9  PLT 38*   < > 31* 23*   < > = values in this interval not displayed.    Basic Metabolic Panel:  Lab Results  Component Value Date   NA 134 (L) 10/21/2023   K 4.3 10/21/2023   CO2 16 (L) 10/21/2023   GLUCOSE 88 10/21/2023   BUN 33 (H) 10/21/2023   CREATININE 1.88 (H) 10/21/2023   CALCIUM 8.8 (L) 10/21/2023   GFRNONAA 40 (L) 10/21/2023   GFRAA 90 02/22/2020   Lipid Panel:  Lab Results  Component Value Date   LDLCALC 65 11/16/2019   HgbA1c:  Lab Results  Component Value Date   HGBA1C 4.9 11/16/2019   Urine Drug Screen:     Component Value Date/Time   LABOPIA NONE DETECTED 09/26/2023 0844   COCAINSCRNUR NONE DETECTED 09/26/2023 0844   LABBENZ NONE DETECTED 09/26/2023 0844   AMPHETMU NONE DETECTED 09/26/2023 0844   THCU NONE DETECTED 09/26/2023 0844   LABBARB NONE DETECTED 09/26/2023 0844    Alcohol Level     Component Value Date/Time   ETH <15 10/19/2023 2144   INR  Lab Results  Component Value Date   INR 2.5 (H) 10/21/2023   APTT  Lab Results  Component Value Date   APTT 75 (H) 10/19/2023   AED levels: No results found for: PHENYTOIN, ZONISAMIDE, LAMOTRIGINE, LEVETIRACETA  CT Head without contrast(10/19/23, Personally reviewed): No acute abnormality. No signs of edema or impending herniation.  CT angio Head and Neck with contrast(10/20/23, Personally reviewed): No LVO. R vert occluded but L vert patent. Bilatearl ICA siphon with calcification but without significant stenosis.   EEG:  10/12 0815 - 10/13 1115: generalized continuous slow  Assessment   [name] is a 58 y.o. male with cirrhosis, history of hepatic encephalopathy, thrombocytopenia, afib, MSSA bacteremia,  who presented with  progressive confusion at his facility and found to have ammonia level of 247. Due to concern of right gaze deviation as a result of stroke, transferred to [facility] from [facility]. At [facility], did not receive TNK as OOW and with thrombocytopenia, thrombectomy due to concern for endocarditis & unable to obtain TEE due to esophageal varices. Upon initial evaluation at [facility], found to have roving eye movements rather than R gaze & all brainstem reflexes intact. Overall clinical presentation consistent with encephalopathy due to hyperammonemia. Therefore, lactulose  enemas recommended to decrease ammonia. Since then has had profound decrease of ammonia to 86 as of 10/13 morning.   Recommendations  - Continue lactulose  enemas. Increase goal to 5 BM's a day. - Continue to trend ammonia - Repeat CTH this evening - Continue LTM EEG ______________________________________________________________________   Signed, Normie CHRISTELLA Blower, MD [facility]

## 2023-10-21 NOTE — Procedures (Signed)
 Patient Name: [name]  MRN: [id number]  Epilepsy Attending: Arlin MALVA Krebs  Referring Physician/Provider: Khaliqdina, Salman, MD  Duration: 10/20/2023 0815 to 10/21/2023 1230  Patient history: 58 yo M presented to [facility] with AMS and found to have an amnionia 247 with right gaze preference. EEG to evaluate for seizure  Level of alertness: comatose/ lethargic   AEDs during EEG study: LEV  Technical aspects: This EEG study was done with scalp electrodes positioned according to the 10-20 International system of electrode placement. Electrical activity was reviewed with band pass filter of 1-70Hz , sensitivity of 7 uV/mm, display speed of 52mm/sec with a 60Hz  notched filter applied as appropriate. EEG data were recorded continuously and digitally stored.  Video monitoring was available and reviewed as appropriate.  Description: EEG showed continuous generalized low amplitude 2-3hz  delta slowing. Breathing artifact was noted during the study. Hyperventilation and photic stimulation were not performed.     EEG was disconnected between 10/20/2023 2008 to 10/13/20225 0024   ABNORMALITY - Continuous slow, generalized  IMPRESSION: This study is suggestive of generalized non specific cerebral dysfunction ( encephalopathy). No seizures or epileptiform discharges were seen throughout the recording.  Clarinda Obi O Deb Loudin

## 2023-10-21 NOTE — Procedures (Signed)
 Cortrak  Person Inserting Tube:  Mady Dolly, RD Tube Type:  Cortrak - 43 inches Tube Size:  10 Tube Location:  Right nare Secured by: Bridle Initial Placement:  Gastric Technique Used to Measure Tube Placement:  Marking at nare/corner of mouth Cortrak Secured At:  68 cm Initial Placement Verification:  Cortrak device (Registered Dieticians Only) Procedure Comments:  Cortrak Tube Team Note:  Consult received to place a Cortrak feeding tube.   No x-ray is required. RN may begin using tube.   If the tube becomes dislodged please keep the tube and contact the Cortrak team at [email or web address] for replacement.  If after hours and replacement cannot be delayed, place a NG tube and confirm placement with an abdominal x-ray.    Vernell Lukes, RD, LDN, CNSC Registered Dietitian II Please reach out via secure chat

## 2023-10-22 ENCOUNTER — Inpatient Hospital Stay (HOSPITAL_COMMUNITY)

## 2023-10-22 DIAGNOSIS — R569 Unspecified convulsions: Secondary | ICD-10-CM | POA: Diagnosis not present

## 2023-10-22 DIAGNOSIS — R4182 Altered mental status, unspecified: Secondary | ICD-10-CM | POA: Diagnosis not present

## 2023-10-22 DIAGNOSIS — K7030 Alcoholic cirrhosis of liver without ascites: Secondary | ICD-10-CM | POA: Diagnosis not present

## 2023-10-22 DIAGNOSIS — E7220 Disorder of urea cycle metabolism, unspecified: Secondary | ICD-10-CM | POA: Diagnosis not present

## 2023-10-22 DIAGNOSIS — Z7189 Other specified counseling: Secondary | ICD-10-CM | POA: Diagnosis not present

## 2023-10-22 DIAGNOSIS — K7682 Hepatic encephalopathy: Secondary | ICD-10-CM | POA: Diagnosis not present

## 2023-10-22 DIAGNOSIS — Z66 Do not resuscitate: Secondary | ICD-10-CM | POA: Diagnosis not present

## 2023-10-22 DIAGNOSIS — Z515 Encounter for palliative care: Secondary | ICD-10-CM | POA: Diagnosis not present

## 2023-10-22 DIAGNOSIS — E8721 Acute metabolic acidosis: Secondary | ICD-10-CM | POA: Diagnosis not present

## 2023-10-22 LAB — T3: T3, Total: 51 ng/dL — ABNORMAL LOW (ref 71–180)

## 2023-10-22 LAB — AMMONIA
Ammonia: 123 umol/L — ABNORMAL HIGH (ref 9–35)
Ammonia: 95 umol/L — ABNORMAL HIGH (ref 9–35)
Ammonia: 84 umol/L — ABNORMAL HIGH (ref 9–35)
Ammonia: 71 umol/L — ABNORMAL HIGH (ref 9–35)
Ammonia: 64 umol/L — ABNORMAL HIGH (ref 9–35)
Ammonia: 61 umol/L — ABNORMAL HIGH (ref 9–35)

## 2023-10-22 LAB — GLUCOSE, CAPILLARY
Glucose-Capillary: 111 mg/dL — ABNORMAL HIGH (ref 70–99)
Glucose-Capillary: 128 mg/dL — ABNORMAL HIGH (ref 70–99)
Glucose-Capillary: 132 mg/dL — ABNORMAL HIGH (ref 70–99)
Glucose-Capillary: 153 mg/dL — ABNORMAL HIGH (ref 70–99)
Glucose-Capillary: 153 mg/dL — ABNORMAL HIGH (ref 70–99)
Glucose-Capillary: 163 mg/dL — ABNORMAL HIGH (ref 70–99)

## 2023-10-22 LAB — CBC
HCT: 29 % — ABNORMAL LOW (ref 39.0–52.0)
Hemoglobin: 10.1 g/dL — ABNORMAL LOW (ref 13.0–17.0)
MCH: 31.8 pg (ref 26.0–34.0)
MCHC: 34.8 g/dL (ref 30.0–36.0)
MCV: 91.2 fL (ref 80.0–100.0)
Platelets: 29 K/uL — CL (ref 150–400)
RBC: 3.18 MIL/uL — ABNORMAL LOW (ref 4.22–5.81)
RDW: 20.6 % — ABNORMAL HIGH (ref 11.5–15.5)
WBC: 5.2 K/uL (ref 4.0–10.5)
nRBC: 0 % (ref 0.0–0.2)

## 2023-10-22 LAB — BPAM PLATELET PHERESIS
Blood Product Expiration Date: 202510152359
ISSUE DATE / TIME: 202510131234
Unit Type and Rh: 7300

## 2023-10-22 LAB — COMPREHENSIVE METABOLIC PANEL WITH GFR
ALT: <5 U/L (ref 0–44)
AST: 48 U/L — ABNORMAL HIGH (ref 15–41)
Albumin: 1.8 g/dL — ABNORMAL LOW (ref 3.5–5.0)
Alkaline Phosphatase: 136 U/L — ABNORMAL HIGH (ref 38–126)
Anion gap: 12 (ref 5–15)
BUN: 35 mg/dL — ABNORMAL HIGH (ref 6–20)
CO2: 17 mmol/L — ABNORMAL LOW (ref 22–32)
Calcium: 8.4 mg/dL — ABNORMAL LOW (ref 8.9–10.3)
Chloride: 107 mmol/L (ref 98–111)
Creatinine, Ser: 1.97 mg/dL — ABNORMAL HIGH (ref 0.61–1.24)
GFR, Estimated: 38 mL/min — ABNORMAL LOW (ref >60)
Glucose, Bld: 105 mg/dL — ABNORMAL HIGH (ref 70–99)
Potassium: 4.1 mmol/L (ref 3.5–5.1)
Sodium: 136 mmol/L (ref 135–145)
Total Bilirubin: 4 mg/dL — ABNORMAL HIGH (ref 0.0–1.2)
Total Protein: 5.3 g/dL — ABNORMAL LOW (ref 6.5–8.1)

## 2023-10-22 LAB — PREPARE PLATELET PHERESIS: Unit division: 0

## 2023-10-22 LAB — MAGNESIUM: Magnesium: 1.8 mg/dL (ref 1.7–2.4)

## 2023-10-22 LAB — CORTISOL-AM, BLOOD: Cortisol - AM: 46.4 ug/dL — ABNORMAL HIGH (ref 6.7–22.6)

## 2023-10-22 LAB — PHOSPHORUS: Phosphorus: 3.3 mg/dL (ref 2.5–4.6)

## 2023-10-22 MED ORDER — OXYCODONE HCL 5 MG PO TABS
5.0000 mg | ORAL_TABLET | ORAL | Status: AC | PRN
  Administered 2023-10-22 (×2): 5 mg
  Filled 2023-10-22 (×2): qty 1

## 2023-10-22 MED ORDER — METOPROLOL TARTRATE 5 MG/5ML IV SOLN
INTRAVENOUS | Status: AC
  Filled 2023-10-22: qty 5

## 2023-10-22 MED ORDER — HYDROCORTISONE SOD SUC (PF) 100 MG IJ SOLR
20.0000 mg | Freq: Every day | INTRAMUSCULAR | Status: DC
  Administered 2023-10-23 – 2023-10-25 (×3): 20 mg via INTRAVENOUS
  Filled 2023-10-22 (×2): qty 0.4
  Filled 2023-10-22: qty 2

## 2023-10-22 MED ORDER — FUROSEMIDE 10 MG/ML IJ SOLN
60.0000 mg | Freq: Once | INTRAMUSCULAR | Status: AC
  Administered 2023-10-22: 60 mg via INTRAVENOUS
  Filled 2023-10-22: qty 6

## 2023-10-22 MED ORDER — LACTULOSE 10 GM/15ML PO SOLN
20.0000 g | Freq: Three times a day (TID) | ORAL | Status: DC
  Administered 2023-10-22 – 2023-10-23 (×4): 20 g
  Filled 2023-10-22 (×4): qty 30

## 2023-10-22 MED ORDER — METOPROLOL TARTRATE 5 MG/5ML IV SOLN
5.0000 mg | Freq: Once | INTRAVENOUS | Status: AC
  Administered 2023-10-22: 5 mg via INTRAVENOUS

## 2023-10-22 MED ORDER — MAGNESIUM SULFATE 2 GM/50ML IV SOLN
2.0000 g | Freq: Once | INTRAVENOUS | Status: AC
  Administered 2023-10-22: 2 g via INTRAVENOUS
  Filled 2023-10-22: qty 50

## 2023-10-22 NOTE — Consult Note (Signed)
 Palliative Care Consult Note                                  Date: 10/22/2023   Patient Name: [name]  DOB: 03/22/1965  MRN: [id number]  Age / Sex: 58 y.o., male  PCP: Carleen Dallas PARAS, FNP (Inactive) Referring Physician: Mannam, Praveen, MD  Reason for Consultation: {Reason for Consult:23484}  Past Medical History:  Diagnosis Date   Anxiety    Atrial fibrillation (HCC)    Blood clot in vein    blood clot in portal vein   CHF (congestive heart failure) (HCC)    Cirrhosis (HCC)    NASH   Constipation    COPD (chronic obstructive pulmonary disease) (HCC)    Dysrhythmia    GERD (gastroesophageal reflux disease)    Gout    Leukopenia 07/08/2015   Neuromuscular disorder (HCC)    neuropathy in hands and feet   Psoriasis    RA (rheumatoid arthritis) (HCC)    Sleep apnea    cpap used- level 10 and greater   Thrombocytopenia     Subjective:   This NP Camellia Kays reviewed medical records, received report from team, assessed the patient and then meet at the patient's bedside to discuss diagnosis, prognosis, GOC, EOL wishes disposition and options.  Before meeting with the patient/family, I spent time reviewing the chart notes including ***. I also reviewed vital signs, nursing flowsheets, medication administrations record, labs, and imaging. Labs reviewed include ***.  I met with ***.   We meet to discuss diagnosis prognosis, GOC, EOL wishes, disposition and options. Concept of Palliative Care was introduced as specialized medical care for people and their families living with serious illness.  If focuses on providing relief from the symptoms and stress of a serious illness.  The goal is to improve quality of life for both the patient and the family. Values and goals of care important to patient and family were attempted to be elicited.  Created space and opportunity for patient  and family to explore thoughts and feelings  regarding current medical situation   Natural trajectory and current clinical status were discussed. Questions and concerns addressed. Patient  encouraged to call with questions or concerns.    Patient/Family Understanding of Illness: ***  Life Review: ***  Patient Values: ***  Baseline Status: ***  Today's Discussion: ***  Goals: ***  Review of Systems  Objective:   Primary Diagnoses: Present on Admission:  Acute hepatic encephalopathy (HCC)  AKI (acute kidney injury)  Paroxysmal atrial fibrillation (HCC)  Thrombocytopenia  Hypothyroidism   Vital Signs:  BP 107/66   Pulse 77   Temp 98.7 F (37.1 C) (Axillary)   Resp 10   Ht 6' (1.829 m)   Wt (!) 175.2 kg   SpO2 96%   BMI 52.38 kg/m   Physical Exam  Palliative Assessment/Data: ***   Advanced Care Planning:   Existing Vynca/ACP Documentation: ***  Primary Decision Maker: {Primary Decision Fjxzm:78612}  Pertinent diagnosis: ***  The patient and/or family consented to a voluntary Advance Care Planning Conversation in person/over the phone***. Individuals present for the conversation: ***  Summary of the conversation: ***  Outcome of the conversations and/or documents completed: ***  I spent *** minutes providing separately identifiable ACP services with the patient and/or surrogate decision maker in a voluntary, in-person conversation discussing the patient's wishes and goals as detailed in the above note.  Assessment & Plan:   HPI/Patient Profile: 58 y.o. male  with past medical history of *** admitted on 10/19/2023 with ***.   SUMMARY OF RECOMMENDATIONS   ***  Symptom Management:  ***  Code Status: {Updated Palliative Code Status:33307}  Prognosis:  {Palliative Care Prognosis:23504}  Discharge Planning:  {Palliative dispostion:23505}   Discussed with: ***    Thank you for allowing us  to participate in the care of Samuel D Friedl PMT will continue to support  holistically.  Time Total: ***  Detailed review of medical records (labs, imaging, vital signs), medically appropriate exam, discussed with treatment team, counseling and education to patient, family, & staff, documenting clinical information, medication management, coordination of care  Signed by: Camellia Kays, NP Palliative Medicine Team  Team Phone # [phone] (Nights/Weekends)  10/22/2023, 11:20 AM

## 2023-10-22 NOTE — Progress Notes (Signed)
 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  Upon initial workup at [facility], patient had ammonia level of 247 with right gaze preference.  With these findings, teleneurology was consulted. Concern for seizure/stroke but patient not candidate for IV thrombolytics given hx of esophageal varices/thrombocytopenia. Patient was loaded with Keppra and transferred to to [facility] for further neurowork-up as well as EEG monitoring.  TRH consulted for admission.  10/12 patient pended for progressive bed and ultimately transferred to PCU.  Nursing staff can concerned with patient's worsening and ongoing acute encephalopathy.  LTM was reviewed by neurology concerned, originally showing diffuse slowing but around noon patient developed burst suppression pattern but on no burst suppression therapy.  Neurology recommended stat CT of head due to severe low platelet count of 29,000. PCCM consulted to assist with management and transfer patient to ICU for further neuro critical care monitoring.  Upon assessment, patient obtunded,  still having right gaze preference.  Also, patient withdrawing from pain in all 4 extremities.  Patient protecting airway at this time, O2 sats 99% on room air, appears to have dried old blood around oral mucosa.  Chest x-ray obtained, showing no evidence of pneumonia or aspiration at this time.  Placed stat CT of head order, ABG stat, T3/T4, lactic acid, CBC, and CMP.  Notified rapid response RN in regards to ICU transfer as well as stat CT of head to evaluate for intracranial abnormality/cerebral edema.  Pertinent  Medical History   Past Medical History:  Diagnosis Date   Anxiety    Atrial fibrillation (HCC)    Blood clot in vein    blood clot in portal vein   CHF (congestive heart failure) (HCC)    Cirrhosis (HCC)    NASH   Constipation    COPD (chronic obstructive pulmonary disease) (HCC)    Dysrhythmia    GERD (gastroesophageal reflux disease)    Gout    Leukopenia 07/08/2015   Neuromuscular disorder (HCC)    neuropathy in hands and feet   Psoriasis    RA (rheumatoid arthritis) (HCC)    Sleep apnea    cpap used- level 10 and greater   Thrombocytopenia      Significant Hospital Events: Including procedures, antibiotic start and stop dates in addition to other pertinent events   10/11 admitted for acute hepatic/metabolic encephalopathy 10/12 PCCM consulted for ongoing worsening acute metabolic encephalopathy, hyperammonemia, stat CT of head ordered 10/13 CTH without acute findings, still encephalopathic  10/14 ammonia improving but still encephalopathic, plan for repeat head CT  Interim History / Subjective:  Ammonia level continues to improve Still not following commands Minimal UOP and difficult to straight cath, will place foley Attempting to reach family/guardian    Objective    Blood pressure 107/66, pulse 77, temperature  98.7 F (37.1 C), temperature source Axillary, resp. rate 10, height 6' (1.829 m), weight (!) 175.2 kg, SpO2 96%.        Intake/Output  Summary (Last 24 hours) at 10/22/2023 0840 Last data filed at 10/22/2023 0700 Gross per 24 hour  Intake 2482.13 ml  Output 7051 ml  Net -4568.87 ml   Filed Weights   10/19/23 2145 10/22/23 0500  Weight: (!) 161 kg (!) 175.2 kg     General:  ill appearing obese M sleeping in bed in NAD HEENT: MM pink/moist, cortrak in place Neuro:  a little more interactive today, he is opening his eyes and tracking and grimacing to pain, not following commands or answering questions CV: s1s2 currently sinus, no m/r/g PULM:  no tachypnea or distress on RA, diminished in the bilateral bases  GI: soft, distended, peri-umbilical hernia Extremities: warm/dry, ecchymosis bilateral lower extremities with  edema    Labs  Ammonia 247>172>132> Bili 4 Platelets 29k Creatinine 1.97 Bicarb 17     Resolved problem list   Assessment and Plan    Acute hepatic encephalopathy in setting of alcoholic cirrhosis Acute metabolic encephalopathy Anion GAP Metabolic Acidosis  Hyperammonemia-improving History of esophageal varices Recent admission at [facility] 10/2 - 10/8, ammonia level noted to be 44 MELD 3.0: 30 at 10/20/2023   Imaging without acute findings  -Continue lactulose  enema regimen, goal 5 stools/day, continue to trend ammonia -Continue EEG monitoring via LTM, no recent noted seizure activity -neuro ordered repeat CTA head and neck -Continue Keppra IV, Continue seizure precautions -Continue delirium and aspiration precautions  -Serial neurochecks -euglycemia, euvolemia, normothermia, trend electrolytes- maintain WNL  -Continue High dose thiamine  and folic acid  admin  -given diamox yesterday with improvement in alkalosis, consider repeat dose  -appears grossly volume overloaded, UOP documented at 5L yesterday but was actually 500cc per nursing, will give a dose of IV Lasix  60mg  -repeat CTH today per neuro   Respiratory Alkalosis  -diamox 500mg  x1 yesterday -repeat VBG with improvement in  pH to 7.45, PCO2 24   AKI -creatinine continues to slightly worsen to 1.9 today -trend renal indices, avoid nephrotoxins as able  -foley and lasix  60mg   Thrombocytopenia Plts 38>29>31>23>29 Hgb stable, likely 2/2 cirrhosis and critical illness -transfused 1 unit platelets yesterday before cortrak placement, no signs of bleeding   Pseudomonas PNA Infective endocarditis Hx of diastolic CHF ECHO 9/23 EF 70-75% hyperdynamic function, NRWA, RV systolic function normal  Endocarditis suspected in 9/25 of this year due to MSSA bactermia, TEE unable to be obtained due to hx of esophageal varices Resp cultures grew pseudomonas 10/13 -Endocarditis abx duration until 11/12/23  -d/w ID and abx changed to Cefepime  Hypothyroidism TSH elevated in past (09/27/23-45.347), currently 16.300, FT4 0.80 -continue synthroid   Adrenal insufficiency -Cortisol level elevated, decrease solucortef from 20mg  bid to 20mg  qd  Proximal A-fib Not on any anticoags due to ongoing thrombocytopenia  Hx of esophageal varices  -Continue monitor on cardiac tele   Nutrition-cortrak and TF DVT prophylaxis-no chemical due to risk of bleeding, SCD's GOC-overall prognosis poor, palliative consult and continued to try to reach family/POA    Labs   CBC: Recent Labs  Lab 10/19/23 2144 10/20/23 0923 10/20/23 1916 10/21/23 0550 10/22/23 0250  WBC 7.4 5.7 8.2 4.9 5.2  NEUTROABS 6.1  --  7.4  --   --   HGB 11.3* 11.1* 12.1* 11.7* 10.1*  HCT 33.6* 32.3* 35.2* 32.9* 29.0*  MCV 92.3 91.0 91.7 88.9 91.2  PLT 38* 29*  31* 23* 29*    Basic Metabolic Panel: Recent Labs  Lab 10/19/23 2144 10/20/23 1044 10/20/23 1916 10/21/23 0550 10/22/23 0250  NA 131* 132* 129* 134* 136  K 4.2 4.4 5.2* 4.3 4.1  CL 100 100 101 105 107  CO2 19* 16* 16* 16* 17*  GLUCOSE 93 92 94 88 105*  BUN 31* 32* 34* 33* 35*  CREATININE 1.89* 1.81* 1.91* 1.88* 1.97*  CALCIUM 9.0 8.4* 8.7* 8.8* 8.4*  MG 2.1  --   --   --  1.8  PHOS  --   --    --   --  3.3   GFR: Estimated Creatinine Clearance: 65.8 mL/min (A) (by C-G formula based on SCr of 1.97 mg/dL (H)). Recent Labs  Lab 10/20/23 0923 10/20/23 1916 10/20/23 1948 10/20/23 2133 10/21/23 0550 10/22/23 0250  WBC 5.7 8.2  --   --  4.9 5.2  LATICACIDVEN  --   --  2.5* 2.8*  --   --     Liver Function Tests: Recent Labs  Lab 10/19/23 2144 10/20/23 1044 10/20/23 1916 10/21/23 0550 10/22/23 0250  AST 63* 57* 62* 56* 48*  ALT 7 5 6 5  <5  ALKPHOS 168* 143* 152* 139* 136*  BILITOT 3.6* 4.3* 4.7* 4.5* 4.0*  PROT 6.0* 5.7* 6.3* 5.9* 5.3*  ALBUMIN  2.6* 2.0* 2.2* 2.0* 1.8*   No results for input(s): LIPASE, AMYLASE in the last 168 hours. Recent Labs  Lab 10/21/23 1641 10/21/23 2000 10/21/23 2247 10/22/23 0250 10/22/23 0620  AMMONIA 121* 110* 123* 95* 84*    ABG    Component Value Date/Time   PHART 7.54 (H) 10/20/2023 2030   PCO2ART 19 (LL) 10/20/2023 2030   PO2ART 93 10/20/2023 2030   HCO3 16.3 (L) 10/21/2023 2000   TCO2 21 04/17/2007 1935   ACIDBASEDEF 6.4 (H) 10/21/2023 2000   O2SAT 87.2 10/21/2023 2000     Coagulation Profile: Recent Labs  Lab 10/19/23 2144 10/20/23 1916 10/21/23 0550 10/21/23 0858  INR 2.3* 2.3* 2.6* 2.5*    Cardiac Enzymes: No results for input(s): CKTOTAL, CKMB, CKMBINDEX, TROPONINI in the last 168 hours.  HbA1C: HB A1C (BAYER DCA - WAIVED)  Date/Time Value Ref Range Status  11/16/2019 11:54 AM 4.9 <7.0 % Final    Comment:                                          Diabetic Adult            <7.0                                       Healthy Adult        4.3 - 5.7                                                           (DCCT/NGSP) American Diabetes Association's Summary of Glycemic Recommendations for Adults with Diabetes: Hemoglobin A1c <7.0%. More stringent glycemic goals (A1c <6.0%) may further reduce complications at the cost of increased risk of hypoglycemia.   02/17/2018 10:26 AM 6.0 <7.0 % Final  Comment:                                          Diabetic Adult            <7.0                                       Healthy Adult        4.3 - 5.7                                                           (DCCT/NGSP) American Diabetes Association's Summary of Glycemic Recommendations for Adults with Diabetes: Hemoglobin A1c <7.0%. More stringent glycemic goals (A1c <6.0%) may further reduce complications at the cost of increased risk of hypoglycemia.    Hgb A1c MFr Bld  Date/Time Value Ref Range Status  05/15/2019 04:29 AM 6.2 (H) 4.8 - 5.6 % Final    Comment:    (NOTE) Pre diabetes:          5.7%-6.4% Diabetes:              >6.4% Glycemic control for   <7.0% adults with diabetes   04/22/2019 02:10 AM 5.7 (H) 4.8 - 5.6 % Final    Comment:    (NOTE)         Prediabetes: 5.7 - 6.4         Diabetes: >6.4         Glycemic control for adults with diabetes: <7.0     CBG: Recent Labs  Lab 10/21/23 1948 10/21/23 2334 10/22/23 0353 10/22/23 0724  GLUCAP 92 96 111* 128*    Review of Systems:   See HPI   Past Medical History:  He,  has a past medical history of Anxiety, Atrial fibrillation (HCC), Blood clot in vein, CHF (congestive heart failure) (HCC), Cirrhosis (HCC), Constipation, COPD (chronic obstructive pulmonary disease) (HCC), Dysrhythmia, GERD (gastroesophageal reflux disease), Gout, Leukopenia (07/08/2015), Neuromuscular disorder (HCC), Psoriasis, RA (rheumatoid arthritis) (HCC), Sleep apnea, and Thrombocytopenia.   Surgical History:   Past Surgical History:  Procedure Laterality Date   ANKLE SURGERY     right ankle talor repair   CHOLECYSTECTOMY  sept 2016   CHOLECYSTECTOMY     COLONOSCOPY  05/08/2011   Procedure: COLONOSCOPY;  Surgeon: Claudis RAYMOND Rivet, MD;  Location[facility];  Service: Endoscopy;  Laterality: N/A;  730   COLONOSCOPY WITH PROPOFOL  N/A 05/27/2020   Procedure: COLONOSCOPY WITH PROPOFOL ;  Surgeon: Eartha Angelia Sieving, MD;   Location[facility];  Service: Gastroenterology;  Laterality: N/A;  Patient needs a unit of platelets prior to procedure.   ESOPHAGEAL BANDING N/A 05/27/2020   Procedure: ESOPHAGEAL BANDING;  Surgeon: Eartha Angelia Sieving, MD;  [facility];  Service: Gastroenterology;  Laterality: N/A;   ESOPHAGOGASTRODUODENOSCOPY  02/28/2011   Procedure: ESOPHAGOGASTRODUODENOSCOPY (EGD);  Surgeon: Claudis RAYMOND Rivet, MD;  [facility];  Service: Endoscopy;  Laterality: N/A;  1200   ESOPHAGOGASTRODUODENOSCOPY N/A 06/11/2012   Procedure: ESOPHAGOGASTRODUODENOSCOPY (EGD);  Surgeon: Claudis RAYMOND Rivet, MD;  [facility];  Service: Endoscopy;  Laterality: N/A;  1200  FYI patient is 400 pounds   ESOPHAGOGASTRODUODENOSCOPY (EGD) WITH PROPOFOL  N/A 04/21/2014   Procedure: ESOPHAGOGASTRODUODENOSCOPY (EGD) WITH PROPOFOL ;  Surgeon: Claudis RAYMOND Rivet, MD;  Location: [facility];  Service: Endoscopy;  Laterality: N/A;   ESOPHAGOGASTRODUODENOSCOPY (EGD) WITH PROPOFOL  N/A 05/27/2020   Procedure: ESOPHAGOGASTRODUODENOSCOPY (EGD) WITH PROPOFOL ;  Surgeon: Eartha Angelia Sieving, MD;  [facility];  Service: Gastroenterology;  Laterality: N/A;  7:30 am   ESOPHAGOGASTRODUODENOSCOPY (EGD) WITH PROPOFOL  N/A 07/29/2020   Procedure: ESOPHAGOGASTRODUODENOSCOPY (EGD) WITH PROPOFOL ;  Surgeon: Eartha Angelia Sieving, MD;  [facility];  Service: Gastroenterology;  Laterality: N/A;  8:20   ESOPHAGOGASTRODUODENOSCOPY (EGD) WITH PROPOFOL  N/A 07/31/2022   Procedure: ESOPHAGOGASTRODUODENOSCOPY (EGD) WITH PROPOFOL ;  Surgeon: Eartha Angelia Sieving, MD;  [facility];  Service: Gastroenterology;  Laterality: N/A;  9:30am;asa 3   HERNIA REPAIR Right    as child; inguinal   HERNIA REPAIR  sept 2016   LIVER BIOPSY  2012   SCIATIC NERVE EXPLORATION     TONSILLECTOMY       Social History:   reports that he has never smoked. His smokeless tobacco use includes snuff and chew. He reports  current alcohol use of about 4.0 standard drinks of alcohol per week. He reports that he does not use drugs.   Family History:  His family history includes Cancer in his brother, father, mother, and sister; Colon cancer in his mother. There is no history of Heart attack or Stroke.   Allergies Allergies  Allergen Reactions   Allopurinol  Other (See Comments)    Joint pain worse Caused worsening gout   Fluoxetine Hcl Itching     Home Medications  Prior to Admission medications   Medication Sig Start Date End Date Taking? Authorizing Provider  acetaminophen  (TYLENOL ) 325 MG tablet Take 650 mg by mouth every 8 (eight) hours as needed for mild pain (pain score 1-3).   Yes [provider]  albuterol  (VENTOLIN  HFA) 108 (90 Base) MCG/ACT inhaler INHALE 2 PUFFS BY MOUTH EVERY 6 HOURS AS NEEDED FOR SHORTNESS OF BREATH OR WHEEZING 12/04/22  Yes Jude Harden GAILS, MD  aspirin  EC 325 MG tablet Take 162.5 mg by mouth daily as needed (anticoagulant).   Yes [provider]  ceFAZolin  (ANCEF ) IVPB Inject 2 g into the vein every 8 (eight) hours. Indication:  MSSA bacteremia First Dose: Yes Last Day of Therapy:  11/12/23 Labs - Once weekly:  CBC/D and CMP Pull PICC line at the completion of IV antibiotic therapy Method of administration: IV Push or per SNF protocol Method of administration may be changed at the discretion of home infusion pharmacist based upon assessment of the patient and/or caregiver's ability to self-administer the medication ordered. 10/05/23 11/14/23 Yes Maree, Pratik D, DO  cyanocobalamin  (V-R VITAMIN B-12) 500 MCG tablet Take 1 tablet (500 mcg total) by mouth every Monday, Wednesday, and Friday. 06/12/23  Yes Pennington, Rebekah M, PA-C  ezetimibe  (ZETIA ) 10 MG tablet Take 1 tablet (10 mg total) by mouth daily. 11/11/19  Yes Dettinger, Fonda LABOR, MD  fluticasone  (FLONASE ) 50 MCG/ACT nasal spray USE 2 SPRAYS IN EACH NOSTRIL EVERY DAY AS NEEDED 01/15/20  Yes Dettinger, Fonda LABOR,  MD  folic acid  (FOLVITE ) 800 MCG tablet Take 1 tablet (800 mcg total) by mouth daily. 06/12/23  Yes Pennington, Rebekah M, PA-C  Heparin  Sod, Pork, Lock Flush (HEPARIN  LOCK FLUSH IV) Inject 3 mLs into the vein every 8 (eight) hours. Use 3 mL intravenously every 8 hours for flush  of non-valved catheter lumens after med admin. Flush with 10 mL of normal saline before med; 10 mL normal saline after med; followed by heparin .   Yes [provider]  hydrocortisone  (CORTEF ) 20 MG tablet Take 1 tablet (20 mg total) by mouth 2 (two) times daily. 10/05/23 11/04/23 Yes Shah, Pratik D, DO  lactulose  (CONSTULOSE ) 10 GM/15ML solution TAKE 30 ML BY MOUTH TWICE DAILY Patient taking differently: Take 45 mLs by mouth 3 (three) times daily. 10/24/22  Yes Carlan, Chelsea L, NP  levothyroxine  (SYNTHROID ) 125 MCG tablet Take 1 tablet (125 mcg total) by mouth daily at 6 (six) AM. 10/06/23 11/05/23 Yes Shah, Pratik D, DO  metoprolol  succinate (TOPROL -XL) 100 MG 24 hr tablet Take 0.5 tablets (50 mg total) by mouth in the morning and at bedtime. 10/16/23  Yes Darci Pore, MD  midodrine (PROAMATINE) 5 MG tablet Take 5 mg by mouth 2 (two) times daily with a meal.   Yes [provider]  Nutritional Supplements (,FEEDING SUPPLEMENT, PROSOURCE PLUS) liquid Take 30 mLs by mouth 3 (three) times daily between meals. 10/16/23  Yes Darci Pore, MD  pantoprazole  (PROTONIX ) 40 MG tablet TAKE 1 TABLET BY MOUTH TWICE DAILY 09/04/23  Yes Castaneda Mayorga, Toribio, MD  potassium chloride  SA (KLOR-CON  M) 20 MEQ tablet Take 20 mEq by mouth daily.   Yes [provider]  rosuvastatin (CRESTOR) 5 MG tablet Take 5 mg by mouth daily.   Yes [provider]  Sodium Chloride  Flush (NORMAL SALINE FLUSH IV) Inject 10 mLs into the vein every 8 (eight) hours.   Yes [provider]  thiamine  (VITAMIN B1) 100 MG tablet Take 100 mg by mouth daily.   Yes [provider]  ZINC OXIDE, TOPICAL, 10  % CREA Apply 1 Application topically in the morning and at bedtime. Apply to reddened area to groin/scrotum for MASD   Yes [provider]  OneTouch Delica Lancets 30G MISC Test BS QID Dx E11.9 05/27/19   Dettinger, Fonda LABOR, MD  spironolactone  (ALDACTONE ) 50 MG tablet Take 1 tablet (50 mg total) by mouth daily. Patient not taking: Reported on 10/20/2023 09/12/21   Lavona Agent, MD  torsemide  (DEMADEX ) 20 MG tablet Take 20 mg by mouth daily. Patient not taking: Reported on 10/20/2023    [provider]     Critical care time: 35 mins     CRITICAL CARE Performed by: Leita SAUNDERS Mordecai Tindol   Total critical care time: 35 minutes  Critical care time was exclusive of separately billable procedures and treating other patients.  Critical care was necessary to treat or prevent imminent or life-threatening deterioration.  Critical care was time spent personally by me on the following activities: development of treatment plan with patient and/or surrogate as well as nursing, discussions with consultants, evaluation of patient's response to treatment, examination of patient, obtaining history from patient or surrogate, ordering and performing treatments and interventions, ordering and review of laboratory studies, ordering and review of radiographic studies, pulse oximetry and re-evaluation of patient's condition.   Leita SAUNDERS Charlesa Ehle, PA-C [facility] See Amion for pager If no response to pager , please call [phone] until 7pm After 7:00 pm call Elink  [phone]

## 2023-10-22 NOTE — Progress Notes (Signed)
 [name] is a 58 y/o M with hx of Etoh cirrhosis, portal vein thrombosis, thrombocytopenia, esophageal varcies and recent admission for hepatic encephalopathy, MSSA Bacteremia with infective endocarditis and adrenal insufficiency. The patient now has pseudomonas bacteremia and is in septic shock with multiorgan failure. I agree with Dr. Theophilus and teams assessment that patient's code status should be DNR.

## 2023-10-22 NOTE — Progress Notes (Signed)
 LTM EEG D/C'd. Patient did have some mild skin break down due to patient poor skin condition. Nurse was notified. [facility] notified of the D/C as well.

## 2023-10-22 NOTE — Procedures (Addendum)
 Patient Name: GWEN EDLER  MRN: [id number]  Epilepsy Attending: Arlin MALVA Krebs  Referring Physician/Provider: Khaliqdina, Salman, MD  Duration: 10/21/2023 1230 to 10/22/2023 1219   Patient history: 58 yo M presented to [facility] with [facility]MS and found to have an amnionia 247 with right gaze preference. EEG to evaluate for seizure   Level of alertness: comatose/ lethargic    AEDs during EEG study: LEV   Technical aspects: This EEG study was done with scalp electrodes positioned according to the 10-20 International system of electrode placement. Electrical activity was reviewed with band pass filter of 1-70Hz , sensitivity of 7 uV/mm, display speed of 65mm/sec with a 60Hz  notched filter applied as appropriate. EEG data were recorded continuously and digitally stored.  Video monitoring was available and reviewed as appropriate.   Description: EEG showed continuous generalized low amplitude 2-3hz  delta slowing. Breathing artifact was noted during the study. Hyperventilation and photic stimulation were not performed.     ABNORMALITY - Continuous slow, generalized   IMPRESSION: This study is suggestive of generalized non specific cerebral dysfunction ( encephalopathy). No seizures or epileptiform discharges were seen throughout the recording.   Lucah Petta O Carmela Piechowski

## 2023-10-22 NOTE — Progress Notes (Signed)
 LTM maint complete not needed all leads are WNL- no skin breakdown under: FP2,F4. F8 lead moved due to mild abrasion. RN informed.

## 2023-10-22 NOTE — Plan of Care (Signed)
 PCCM interval progress note:   I spoke to DSS who has an emergent protective order for patient, however they are not able to make code status decisions under this order.  They recommend two physician DNR if indicated, discussed with Dr. Theophilus and will pursue this today.  The family listed below do not have healthcare decision making power,   Palliative to try to contact family.  Protective order printed and placed in patient's chart.       Attached is the signed order authorizing protective services for [name] DOB: 1965-01-20.   Here are the contact numbers to his next of kin: Marinda Das, azalea (resides in [place]), [phone] Chryl Brandy, niece (resides in [place] ), [phone]  Here are other contact numbers for DSS: [phone] (my work cell) Elenor Parents, my direct supervisor, [phone], ext. [phone], [email or web address] Mont Bohr, Program Manager, [phone], ext. [phone], [email or web address]  Afterhours/Weekends number: [phone]   Everett Potters, MS Social Worker III [facility],The Department of Health and [facility] Adult Protective Services [address], [place] [address] OFFICE: [phone], ext. [phone] FAX: [phone] [email or web address] Doffing R Jamillah Camilo, PA-C

## 2023-10-23 ENCOUNTER — Encounter (INDEPENDENT_AMBULATORY_CARE_PROVIDER_SITE_OTHER): Payer: Self-pay | Admitting: Gastroenterology

## 2023-10-23 ENCOUNTER — Inpatient Hospital Stay (HOSPITAL_COMMUNITY)

## 2023-10-23 DIAGNOSIS — Z7189 Other specified counseling: Secondary | ICD-10-CM | POA: Diagnosis not present

## 2023-10-23 DIAGNOSIS — K7682 Hepatic encephalopathy: Secondary | ICD-10-CM | POA: Diagnosis not present

## 2023-10-23 DIAGNOSIS — E7220 Disorder of urea cycle metabolism, unspecified: Secondary | ICD-10-CM | POA: Diagnosis not present

## 2023-10-23 DIAGNOSIS — Z66 Do not resuscitate: Secondary | ICD-10-CM | POA: Diagnosis not present

## 2023-10-23 DIAGNOSIS — K7030 Alcoholic cirrhosis of liver without ascites: Secondary | ICD-10-CM | POA: Diagnosis not present

## 2023-10-23 DIAGNOSIS — E8721 Acute metabolic acidosis: Secondary | ICD-10-CM | POA: Diagnosis not present

## 2023-10-23 DIAGNOSIS — Z515 Encounter for palliative care: Secondary | ICD-10-CM | POA: Diagnosis not present

## 2023-10-23 LAB — CBC
HCT: 32.4 % — ABNORMAL LOW (ref 39.0–52.0)
Hemoglobin: 10.8 g/dL — ABNORMAL LOW (ref 13.0–17.0)
MCH: 31.3 pg (ref 26.0–34.0)
MCHC: 33.3 g/dL (ref 30.0–36.0)
MCV: 93.9 fL (ref 80.0–100.0)
Platelets: 26 K/uL — CL (ref 150–400)
RBC: 3.45 MIL/uL — ABNORMAL LOW (ref 4.22–5.81)
RDW: 21.1 % — ABNORMAL HIGH (ref 11.5–15.5)
WBC: 4.5 K/uL (ref 4.0–10.5)
nRBC: 0 % (ref 0.0–0.2)

## 2023-10-23 LAB — AMMONIA
Ammonia: 44 umol/L — ABNORMAL HIGH (ref 9–35)
Ammonia: 28 umol/L (ref 9–35)
Ammonia: 35 umol/L (ref 9–35)
Ammonia: 43 umol/L — ABNORMAL HIGH (ref 9–35)

## 2023-10-23 LAB — COMPREHENSIVE METABOLIC PANEL WITH GFR
ALT: <5 U/L (ref 0–44)
AST: 53 U/L — ABNORMAL HIGH (ref 15–41)
Albumin: 1.9 g/dL — ABNORMAL LOW (ref 3.5–5.0)
Alkaline Phosphatase: 150 U/L — ABNORMAL HIGH (ref 38–126)
Anion gap: 9 (ref 5–15)
BUN: 40 mg/dL — ABNORMAL HIGH (ref 6–20)
CO2: 18 mmol/L — ABNORMAL LOW (ref 22–32)
Calcium: 8.2 mg/dL — ABNORMAL LOW (ref 8.9–10.3)
Chloride: 110 mmol/L (ref 98–111)
Creatinine, Ser: 1.73 mg/dL — ABNORMAL HIGH (ref 0.61–1.24)
GFR, Estimated: 45 mL/min — ABNORMAL LOW (ref >60)
Glucose, Bld: 175 mg/dL — ABNORMAL HIGH (ref 70–99)
Potassium: 3.3 mmol/L — ABNORMAL LOW (ref 3.5–5.1)
Sodium: 137 mmol/L (ref 135–145)
Total Bilirubin: 3.3 mg/dL — ABNORMAL HIGH (ref 0.0–1.2)
Total Protein: 5.7 g/dL — ABNORMAL LOW (ref 6.5–8.1)

## 2023-10-23 LAB — PHOSPHORUS: Phosphorus: 2.9 mg/dL (ref 2.5–4.6)

## 2023-10-23 LAB — GLUCOSE, CAPILLARY
Glucose-Capillary: 158 mg/dL — ABNORMAL HIGH (ref 70–99)
Glucose-Capillary: 139 mg/dL — ABNORMAL HIGH (ref 70–99)
Glucose-Capillary: 136 mg/dL — ABNORMAL HIGH (ref 70–99)
Glucose-Capillary: 179 mg/dL — ABNORMAL HIGH (ref 70–99)
Glucose-Capillary: 154 mg/dL — ABNORMAL HIGH (ref 70–99)

## 2023-10-23 LAB — MAGNESIUM: Magnesium: 2 mg/dL (ref 1.7–2.4)

## 2023-10-23 LAB — HEMOGLOBIN A1C
Hgb A1c MFr Bld: 4.7 % — ABNORMAL LOW (ref 4.8–5.6)
Mean Plasma Glucose: 88.19 mg/dL

## 2023-10-23 LAB — VITAMIN B1: Vitamin B1 (Thiamine): 158.8 nmol/L (ref 66.5–200.0)

## 2023-10-23 MED ORDER — LORAZEPAM 2 MG/ML IJ SOLN: 2.0000 mg | Freq: Once | INTRAMUSCULAR | Status: AC

## 2023-10-23 MED ORDER — LORAZEPAM 2 MG/ML IJ SOLN
INTRAMUSCULAR | Status: AC
  Administered 2023-10-23: 2 mg
  Filled 2023-10-23: qty 1

## 2023-10-23 MED ORDER — INSULIN ASPART 100 UNIT/ML IJ SOLN
0.0000 [IU] | INTRAMUSCULAR | Status: DC
  Administered 2023-10-23 (×2): 1 [IU] via SUBCUTANEOUS
  Administered 2023-10-23: 0 [IU] via SUBCUTANEOUS
  Administered 2023-10-23 – 2023-10-24 (×3): 1 [IU] via SUBCUTANEOUS

## 2023-10-23 MED ORDER — SPIRONOLACTONE 12.5 MG HALF TABLET
12.5000 mg | ORAL_TABLET | Freq: Every day | ORAL | Status: DC
  Administered 2023-10-23: 12.5 mg via ORAL
  Filled 2023-10-23: qty 1

## 2023-10-23 MED ORDER — FUROSEMIDE 10 MG/ML IJ SOLN
60.0000 mg | Freq: Four times a day (QID) | INTRAMUSCULAR | Status: AC
  Administered 2023-10-23 (×2): 60 mg via INTRAVENOUS
  Filled 2023-10-23 (×2): qty 6

## 2023-10-23 MED ORDER — ACETAMINOPHEN 325 MG PO TABS: 650.0000 mg | ORAL_TABLET | Freq: Four times a day (QID) | ORAL | Status: DC | PRN

## 2023-10-23 MED ORDER — LORAZEPAM 2 MG/ML IJ SOLN
2.0000 mg | Freq: Once | INTRAMUSCULAR | Status: DC
  Filled 2023-10-23: qty 1

## 2023-10-23 MED ORDER — POTASSIUM CHLORIDE 20 MEQ PO PACK
40.0000 meq | PACK | ORAL | Status: AC
  Administered 2023-10-23 (×2): 40 meq
  Filled 2023-10-23 (×2): qty 2

## 2023-10-23 MED ORDER — METOPROLOL TARTRATE 5 MG/5ML IV SOLN
5.0000 mg | INTRAVENOUS | Status: AC | PRN
  Administered 2023-10-24 – 2023-10-25 (×2): 5 mg via INTRAVENOUS
  Filled 2023-10-23 (×2): qty 5

## 2023-10-23 MED ORDER — OXYCODONE HCL 5 MG PO TABS
5.0000 mg | ORAL_TABLET | Freq: Four times a day (QID) | ORAL | Status: DC | PRN
  Administered 2023-10-23: 5 mg
  Filled 2023-10-23: qty 1

## 2023-10-23 MED ORDER — OXYCODONE HCL 5 MG PO TABS: 5.0000 mg | ORAL_TABLET | Freq: Four times a day (QID) | ORAL | Status: DC | PRN

## 2023-10-23 MED ORDER — ACETAMINOPHEN 650 MG RE SUPP: 650.0000 mg | Freq: Four times a day (QID) | RECTAL | Status: DC | PRN

## 2023-10-23 NOTE — Progress Notes (Signed)
 [facility] ADULT ICU REPLACEMENT PROTOCOL   The patient does apply for the [facility] Adult ICU Electrolyte Replacment Protocol based on the criteria listed below:   1.Exclusion criteria: TCTS, ECMO, Dialysis, and Myasthenia Gravis patients 2. Is GFR >/= 30 ml/min? Yes.    Patient's GFR today is 45 3. Is SCr </= 2? Yes.   Patient's SCr is 1.73 mg/dL 4. Did SCr increase >/= 0.5 in 24 hours? No. 5.Pt's weight >40kg  Yes.   6. Abnormal electrolyte(s): K+ = 3.3  7. Electrolytes replaced per protocol 8.  Call MD STAT for K+ </= 2.5, Phos </= 1, or Mag </= 1 Physician:  Epimenio, eMD   [name] Brogen Duell 10/23/2023 5:54 AM

## 2023-10-23 NOTE — Progress Notes (Signed)
 NAME:  [name], MRN:  [id number], DOB:  Jul 08, 1965, LOS: 3 ADMISSION DATE:  10/19/2023, CONSULTATION DATE:  10/12  REFERRING MD: Al-Sultani CHIEF COMPLAINT:  Acute Metabolic Enceph/Worsening AMS   History of Present Illness:  Patient is a 58 year old male with a significant past medical history of cirrhosis complicated by mesenteric/portal vein thrombosis, thrombocytopenia, esophageal varices, ongoing history of hepatic encephalopathy ([facility] - 10/8 for acute hepatic encephalopathy in setting of AKI), recent suspicion of infective endocarditis/MSSA bacteremia-(09/2023, receiving IV cefazolin  until 11/12/2023), A-fib on no anticoagulation, diastolic congestive heart failure, hypothyroidism, adrenal insufficiency on hydrocortisone  who [facility] on 10/11 from [facility] skilled nursing facility because of increased confusion and having right gaze preference.  Upon initial workup at [facility], patient had ammonia level of 247 with right gaze preference.  With these findings, teleneurology was consulted. Concern for seizure/stroke but patient not candidate for IV thrombolytics given hx of esophageal varices/thrombocytopenia. Patient was loaded with Keppra and transferred to to [facility] for further neurowork-up as well as EEG monitoring.  TRH consulted for admission.  10/12 patient pended for progressive bed and ultimately transferred to PCU.  Nursing staff can concerned with patient's worsening and ongoing acute encephalopathy.  LTM was reviewed by neurology concerned, originally showing diffuse slowing but around noon patient developed burst suppression pattern but on no burst suppression therapy.  Neurology recommended stat CT of head due to severe low platelet count of 29,000. PCCM consulted to assist with management and transfer patient to ICU for further neuro critical care monitoring.  Upon assessment, patient obtunded,  still having right gaze preference.  Also, patient withdrawing from pain in all 4 extremities.  Patient protecting airway at this time, O2 sats 99% on room air, appears to have dried old blood around oral mucosa.  Chest x-ray obtained, showing no evidence of pneumonia or aspiration at this time.  Placed stat CT of head order, ABG stat, T3/T4, lactic acid, CBC, and CMP.  Notified rapid response RN in regards to ICU transfer as well as stat CT of head to evaluate for intracranial abnormality/cerebral edema.  Pertinent  Medical History   Past Medical History:  Diagnosis Date   Anxiety    Atrial fibrillation (HCC)    Blood clot in vein    blood clot in portal vein   CHF (congestive heart failure) (HCC)    Cirrhosis (HCC)    NASH   Constipation    COPD (chronic obstructive pulmonary disease) (HCC)    Dysrhythmia    GERD (gastroesophageal reflux disease)    Gout    Leukopenia 07/08/2015   Neuromuscular disorder (HCC)    neuropathy in hands and feet   Psoriasis    RA (rheumatoid arthritis) (HCC)    Sleep apnea    cpap used- level 10 and greater   Thrombocytopenia      Significant Hospital Events: Including procedures, antibiotic start and stop dates in addition to other pertinent events   10/11 admitted for acute hepatic/metabolic encephalopathy 10/12 PCCM consulted for ongoing worsening acute metabolic encephalopathy, hyperammonemia, stat CT of head ordered 10/13 CTH without acute findings, still encephalopathic  10/14 ammonia improving but still encephalopathic, plan for repeat head CT  Interim History / Subjective:  Ammonia continues to improve. Patient still not following commands, incomprehensible sounds only. DSS contacted yesterday, who have an emergent protective order for patient, they are unable to make code status decisions and recommended two physician DNR  if appropriate.  Patient now two physician DNR (see attending attestation 10/14). EEG removed yesterday, no seizure  activity.   Objective    Blood pressure 116/83, pulse 81, temperature (!) 96.6 F (35.9 C), temperature source Axillary, resp. rate 10, height 6' (1.829 m), weight (!) 169.4 kg, SpO2 97%.        Intake/Output Summary (Last 24 hours) at 10/23/2023 1056 Last data filed at 10/23/2023 0900 Gross per 24 hour  Intake 1693.52 ml  Output 3325 ml  Net -1631.48 ml   Filed Weights   10/19/23 2145 10/22/23 0500 10/23/23 0500  Weight: (!) 161 kg (!) 175.2 kg (!) 169.4 kg     General: chronically ill appearing, obese male, resting in bed HEENT: MM pink/moist, Cortrak in place Neuro: opens eyes spontaneously, moves upper extremities spontaneously and with good strength, no focal deficits, grimaces to painful stimuli in lower extremities  CV: irregularly irregular, normal S1 and S2, no m/r/g PULM: breathing comfortably on room air, breath sounds diminished bilateral bases GI: soft, obese, peri-umbilical hernia, pitting edema in abdomen Extremities: warm/dry, ecchymosis bilateral lower extremities with  edema   Labs in chart reviewed.   Resolved problem list   Assessment and Plan   Acute hepatic encephalopathy in setting of alcoholic cirrhosis Acute metabolic encephalopathy Anion GAP Metabolic Acidosis  Hyperammonemia, improving History of esophageal varices Recent admission at [facility] 10/2-10/8 for acute encephalopathy, AKI and presumed endocarditis/MSSA bacteremia. MELD score 30 on 10/20/2023. Ammonia this morning improved to 28 from 44. EEG 10/14 continuous slow generalized low amplitude suggestive of encephalopathy, no seizures. CT imaging thus far with NAICP. Suspect sepsis due to pseudomonas bacteremia also contributing to metabolic encephalopathy.   - Continue lactulose  TID for goal 5 stools/day, continue to trend ammonia - Neuro following, appreciate recommendations. Will discuss utility of MRI brain - Discussed with neuro and will discontinue Keppra given negative EEG -  Continue delirium and aspiration precautions  - Maintain euglycemia, euvolemia, normothermia, and optimize electrolytes  - Continue high dose thiamine  and folic acid  supplementation  - Remains grossly volume overloaded, will resume low dose spironolactone  and give 60 mg IV Lasix  x 2 today   Paroxysmal atrial fibrillation Currently with rate controlled ventricular response. Normotensive. - Deferring AC given thrombocytopenia  AKI, improving Acute urinary retention - Continue foley - Avoid nephrotoxins, ensure adequate renal perfusion  Anemia of critical illness Thrombocytopenia In the setting of alcoholic cirrhosis. Platelets continue to fall, no active signs of bleeding.  - Continue to trend CBC  Sepsis due to pseudomonas bacteremia Blood cultures 10/12 with pseudomonas aeruginosa.  - Follow-up sensitivities  - Continue cefepime   Infective endocarditis Chronic diastolic heart failure ECHO 9/23 EF 70-75% hyperdynamic function, NRWA, RV systolic function normal. Endocarditis suspected in 9/25 of this year due to MSSA bacteremia. TEE unable to be obtained due to hisotry of esophageal varices.  - On long term antibiotics with cefazolin  until (11/12/2023), changed to cefepime given pseudomonas bacteremia on 10/21/2023 (discussed with ID)  Hypothyroidism TSH elevated in past (09/27/23-45.347), currently 16.300, FT4 0.80 - Continue synthroid   Adrenal insufficiency Cortisol level 46.4. - Decreased solucortef from 20 mg BID to 20 mg daily on 10/14   Nutrition-cortrak and TF DVT prophylaxis-no chemical due to risk of bleeding, SCD's GOC-overall prognosis poor, palliative consult and continued to try to reach family/POA. Made 2 physician DNR on 10/14- see attending attestation on PCCM note from that day  Labs   CBC: Recent Labs  Lab 10/19/23 2144 10/20/23 0923 10/20/23  1916 10/21/23 0550 10/22/23 0250 10/23/23 0328  WBC 7.4 5.7 8.2 4.9 5.2 4.5  NEUTROABS 6.1  --  7.4  --   --    --   HGB 11.3* 11.1* 12.1* 11.7* 10.1* 10.8*  HCT 33.6* 32.3* 35.2* 32.9* 29.0* 32.4*  MCV 92.3 91.0 91.7 88.9 91.2 93.9  PLT 38* 29* 31* 23* 29* 26*    Basic Metabolic Panel: Recent Labs  Lab 10/19/23 2144 10/20/23 1044 10/20/23 1916 10/21/23 0550 10/22/23 0250 10/23/23 0328  NA 131* 132* 129* 134* 136 137  K 4.2 4.4 5.2* 4.3 4.1 3.3*  CL 100 100 101 105 107 110  CO2 19* 16* 16* 16* 17* 18*  GLUCOSE 93 92 94 88 105* 175*  BUN 31* 32* 34* 33* 35* 40*  CREATININE 1.89* 1.81* 1.91* 1.88* 1.97* 1.73*  CALCIUM 9.0 8.4* 8.7* 8.8* 8.4* 8.2*  MG 2.1  --   --   --  1.8 2.0  PHOS  --   --   --   --  3.3 2.9   GFR: Estimated Creatinine Clearance: 73.4 mL/min (A) (by C-G formula based on SCr of 1.73 mg/dL (H)). Recent Labs  Lab 10/20/23 1916 10/20/23 1948 10/20/23 2133 10/21/23 0550 10/22/23 0250 10/23/23 0328  WBC 8.2  --   --  4.9 5.2 4.5  LATICACIDVEN  --  2.5* 2.8*  --   --   --     Liver Function Tests: Recent Labs  Lab 10/20/23 1044 10/20/23 1916 10/21/23 0550 10/22/23 0250 10/23/23 0328  AST 57* 62* 56* 48* 53*  ALT 5 6 5  <5 <5  ALKPHOS 143* 152* 139* 136* 150*  BILITOT 4.3* 4.7* 4.5* 4.0* 3.3*  PROT 5.7* 6.3* 5.9* 5.3* 5.7*  ALBUMIN  2.0* 2.2* 2.0* 1.8* 1.9*   No results for input(s): LIPASE, AMYLASE in the last 168 hours. Recent Labs  Lab 10/22/23 1042 10/22/23 1702 10/22/23 2209 10/23/23 0328 10/23/23 0616  AMMONIA 71* 64* 61* 44* 28    ABG    Component Value Date/Time   PHART 7.54 (H) 10/20/2023 2030   PCO2ART 19 (LL) 10/20/2023 2030   PO2ART 93 10/20/2023 2030   HCO3 16.3 (L) 10/21/2023 2000   TCO2 21 04/17/2007 1935   ACIDBASEDEF 6.4 (H) 10/21/2023 2000   O2SAT 87.2 10/21/2023 2000     Coagulation Profile: Recent Labs  Lab 10/19/23 2144 10/20/23 1916 10/21/23 0550 10/21/23 0858  INR 2.3* 2.3* 2.6* 2.5*    Cardiac Enzymes: No results for input(s): CKTOTAL, CKMB, CKMBINDEX, TROPONINI in the last 168  hours.  HbA1C: HB A1C (BAYER DCA - WAIVED)  Date/Time Value Ref Range Status  11/16/2019 11:54 AM 4.9 <7.0 % Final    Comment:                                          Diabetic Adult            <7.0                                       Healthy Adult        4.3 - 5.7                                                           (  DCCT/NGSP) American Diabetes Association's Summary of Glycemic Recommendations for Adults with Diabetes: Hemoglobin A1c <7.0%. More stringent glycemic goals (A1c <6.0%) may further reduce complications at the cost of increased risk of hypoglycemia.   02/17/2018 10:26 AM 6.0 <7.0 % Final    Comment:                                          Diabetic Adult            <7.0                                       Healthy Adult        4.3 - 5.7                                                           (DCCT/NGSP) American Diabetes Association's Summary of Glycemic Recommendations for Adults with Diabetes: Hemoglobin A1c <7.0%. More stringent glycemic goals (A1c <6.0%) may further reduce complications at the cost of increased risk of hypoglycemia.    Hgb A1c MFr Bld  Date/Time Value Ref Range Status  10/23/2023 03:28 AM 4.7 (L) 4.8 - 5.6 % Final    Comment:    (NOTE) Diagnosis of Diabetes The following HbA1c ranges recommended by the American Diabetes Association (ADA) may be used as an aid in the diagnosis of diabetes mellitus.  Hemoglobin             Suggested A1C NGSP%              Diagnosis  <5.7                   Non Diabetic  5.7-6.4                Pre-Diabetic  >6.4                   Diabetic  <7.0                   Glycemic control for                       adults with diabetes.    05/15/2019 04:29 AM 6.2 (H) 4.8 - 5.6 % Final    Comment:    (NOTE) Pre diabetes:          5.7%-6.4% Diabetes:              >6.4% Glycemic control for   <7.0% adults with diabetes     CBG: Recent Labs  Lab 10/22/23 1519 10/22/23 2002 10/22/23 2345  10/23/23 0408 10/23/23 0805  GLUCAP 153* 153* 163* 158* 139*       [name] 10/23/23 12:27 PM  Please see [email or web address] for pager details.  From 7A-7P if no response, please call [phone] After hours, please call ELink [phone]

## 2023-10-23 NOTE — Plan of Care (Signed)
  Problem: Nutrition: Goal: Adequate nutrition will be maintained Outcome: Progressing   Problem: Pain Managment: Goal: General experience of comfort will improve and/or be controlled Outcome: Progressing   Problem: Fluid Volume: Goal: Ability to maintain a balanced intake and output will improve Outcome: Progressing   Problem: Tissue Perfusion: Goal: Adequacy of tissue perfusion will improve Outcome: Progressing   Problem: Clinical Measurements: Goal: Will remain free from infection Outcome: Not Progressing Goal: Cardiovascular complication will be avoided Outcome: Not Progressing   Problem: Activity: Goal: Risk for activity intolerance will decrease Outcome: Not Progressing   Problem: Coping: Goal: Level of anxiety will decrease Outcome: Not Progressing   Problem: Skin Integrity: Goal: Risk for impaired skin integrity will decrease Outcome: Not Progressing   Problem: Skin Integrity: Goal: Risk for impaired skin integrity will decrease Outcome: Not Progressing

## 2023-10-23 NOTE — Progress Notes (Signed)
  Daily Progress Note   Date: 10/23/2023   Patient Name: [name]  DOB: [birth year]  MRN: [id number]  Age / Sex: 58 y.o., male  Attending Physician: Mannam, Praveen, MD Primary Care Physician: [name] Dallas PARAS, FNP (Inactive) Admit Date: 10/19/2023 Length of Stay: 3 days  Reason for Follow-up: {Reason for Consult:23484}  Past Medical History:  Diagnosis Date   Anxiety    Atrial fibrillation (HCC)    Blood clot in vein    blood clot in portal vein   CHF (congestive heart failure) (HCC)    Cirrhosis (HCC)    NASH   Constipation    COPD (chronic obstructive pulmonary disease) (HCC)    Dysrhythmia    GERD (gastroesophageal reflux disease)    Gout    Leukopenia 07/08/2015   Neuromuscular disorder (HCC)    neuropathy in hands and feet   Psoriasis    RA (rheumatoid arthritis) (HCC)    Sleep apnea    cpap used- level 10 and greater   Thrombocytopenia     Subjective:   Subjective: Chart Reviewed. Updates received. Patient Assessed. Created space and opportunity for patient  and family to explore thoughts and feelings regarding current medical situation.  Today's Discussion: Today before meeting with the patient/family, I reviewed the chart notes including ***. I also reviewed vital signs, nursing flowsheets, medication administrations record, labs, and imaging. Labs reviewed include ***.  ***  Review of Systems  Objective:   Primary Diagnoses: Present on Admission:  Acute hepatic encephalopathy (HCC)  AKI (acute kidney injury)  Paroxysmal atrial fibrillation (HCC)  Thrombocytopenia  Hypothyroidism   Vital Signs:  BP 116/83   Pulse 81   Temp 97.7 F (36.5 C) (Axillary)   Resp 10   Ht 6' (1.829 m)   Wt (!) 169.4 kg   SpO2 97%   BMI 50.65 kg/m   Physical Exam  Palliative Assessment/Data: ***   Existing Vynca/ACP Documentation: ***  Advanced Care Planning:   Existing Vynca/ACP Documentation: ***  Primary Decision Maker: {Primary Decision  Fjxzm:78612}  Pertinent diagnosis: ***  The patient and/or family consented to a voluntary Advance Care Planning Conversation in person/over the phone***. Individuals present for the conversation: ***  Summary of the conversation: ***  Outcome of the conversations and/or documents completed: ***  I spent *** minutes providing separately identifiable ACP services with the patient and/or surrogate decision maker in a voluntary, in-person conversation discussing the patient's wishes and goals as detailed in the above note.  Assessment & Plan:   HPI/Patient Profile:  ***  SUMMARY OF RECOMMENDATIONS   ***  Symptom Management:  ***  Code Status: {Updated Palliative Code Status:33307}  Prognosis: {Palliative Care Prognosis:23504}  Discharge Planning: {Palliative dispostion:23505}  Discussed with: ***  Thank you for allowing us  to participate in the care of [name] PMT will continue to support holistically.  Time Total: ***  Detailed review of medical records (labs, imaging, vital signs), medically appropriate exam, discussed with treatment team, counseling and education to patient, family, & staff, documenting clinical information, medication management, coordination of care  Camellia Kays, NP Palliative Medicine Team  Team Phone # [phone] (Nights/Weekends)  09/06/2020, 8:17 AM

## 2023-10-24 DIAGNOSIS — Z515 Encounter for palliative care: Secondary | ICD-10-CM | POA: Diagnosis not present

## 2023-10-24 DIAGNOSIS — K7030 Alcoholic cirrhosis of liver without ascites: Secondary | ICD-10-CM | POA: Diagnosis not present

## 2023-10-24 DIAGNOSIS — Z66 Do not resuscitate: Secondary | ICD-10-CM | POA: Diagnosis not present

## 2023-10-24 DIAGNOSIS — K7682 Hepatic encephalopathy: Secondary | ICD-10-CM | POA: Diagnosis not present

## 2023-10-24 DIAGNOSIS — Z7189 Other specified counseling: Secondary | ICD-10-CM | POA: Diagnosis not present

## 2023-10-24 LAB — CBC
HCT: 38.3 % — ABNORMAL LOW (ref 39.0–52.0)
Hemoglobin: 12.6 g/dL — ABNORMAL LOW (ref 13.0–17.0)
MCH: 31.6 pg (ref 26.0–34.0)
MCHC: 32.9 g/dL (ref 30.0–36.0)
MCV: 96 fL (ref 80.0–100.0)
Platelets: 21 K/uL — CL (ref 150–400)
RBC: 3.99 MIL/uL — ABNORMAL LOW (ref 4.22–5.81)
RDW: 21.3 % — ABNORMAL HIGH (ref 11.5–15.5)
WBC: 4.7 K/uL (ref 4.0–10.5)
nRBC: 0.4 % — ABNORMAL HIGH (ref 0.0–0.2)

## 2023-10-24 LAB — GLUCOSE, CAPILLARY
Glucose-Capillary: 102 mg/dL — ABNORMAL HIGH (ref 70–99)
Glucose-Capillary: 116 mg/dL — ABNORMAL HIGH (ref 70–99)
Glucose-Capillary: 135 mg/dL — ABNORMAL HIGH (ref 70–99)
Glucose-Capillary: 136 mg/dL — ABNORMAL HIGH (ref 70–99)
Glucose-Capillary: 145 mg/dL — ABNORMAL HIGH (ref 70–99)
Glucose-Capillary: 152 mg/dL — ABNORMAL HIGH (ref 70–99)

## 2023-10-24 LAB — CULTURE, BLOOD (ROUTINE X 2)
Special Requests: ADEQUATE
Special Requests: ADEQUATE

## 2023-10-24 LAB — COMPREHENSIVE METABOLIC PANEL WITH GFR
ALT: 9 U/L (ref 0–44)
AST: 91 U/L — ABNORMAL HIGH (ref 15–41)
Albumin: 1.8 g/dL — ABNORMAL LOW (ref 3.5–5.0)
Alkaline Phosphatase: 172 U/L — ABNORMAL HIGH (ref 38–126)
Anion gap: 11 (ref 5–15)
BUN: 40 mg/dL — ABNORMAL HIGH (ref 6–20)
CO2: 19 mmol/L — ABNORMAL LOW (ref 22–32)
Calcium: 8.4 mg/dL — ABNORMAL LOW (ref 8.9–10.3)
Chloride: 112 mmol/L — ABNORMAL HIGH (ref 98–111)
Creatinine, Ser: 1.45 mg/dL — ABNORMAL HIGH (ref 0.61–1.24)
GFR, Estimated: 55 mL/min — ABNORMAL LOW (ref >60)
Glucose, Bld: 157 mg/dL — ABNORMAL HIGH (ref 70–99)
Potassium: 4.5 mmol/L (ref 3.5–5.1)
Sodium: 142 mmol/L (ref 135–145)
Total Bilirubin: 3.4 mg/dL — ABNORMAL HIGH (ref 0.0–1.2)
Total Protein: 6 g/dL — ABNORMAL LOW (ref 6.5–8.1)

## 2023-10-24 LAB — AMMONIA: Ammonia: 42 umol/L — ABNORMAL HIGH (ref 9–35)

## 2023-10-24 LAB — MAGNESIUM: Magnesium: 1.9 mg/dL (ref 1.7–2.4)

## 2023-10-24 LAB — PHOSPHORUS: Phosphorus: 2.5 mg/dL (ref 2.5–4.6)

## 2023-10-24 MED ORDER — SODIUM CHLORIDE 0.9 % IV BOLUS
500.0000 mL | Freq: Once | INTRAVENOUS | Status: AC
  Administered 2023-10-24: 500 mL via INTRAVENOUS

## 2023-10-24 MED ORDER — FUROSEMIDE 10 MG/ML IJ SOLN
40.0000 mg | Freq: Two times a day (BID) | INTRAMUSCULAR | Status: DC
  Administered 2023-10-24 – 2023-10-27 (×6): 40 mg via INTRAVENOUS
  Filled 2023-10-24 (×6): qty 4

## 2023-10-24 NOTE — TOC Initial Note (Addendum)
 Transition of Care [facility]) - Initial/Assessment Note    Patient Details  Name: [name] MRN: [id number] Date of Birth: Dec 17, 1965  [facility] CM/SW Contact:    Luise JAYSON Pan, LCSWA Phone Number: 10/24/2023, 10:22 AM  Clinical Narrative:   Patient is from [facility] Per the [facility], patient can return. Per [place], patients step-son Jennings) is attempting to pursue guardianship as well.   Per chart review, patient has a legal guardian with DSS, Everett Potters, [phone]). CSW left a VM for Ms. Hill to return CSWs call.   11:11 AM CSW reached out to [place] and [place] with Financial Counseling about if patient has medicaid and if so, may that be added to patients chart.   CSW will continue to follow.   Expected Discharge Plan: Skilled Nursing Facility Barriers to Discharge: Continued Medical Work up   Patient Goals and CMS Choice Patient states their goals for this hospitalization and ongoing recovery are:: Return to [facility]          Expected Discharge Plan and Services In-house Referral: Clinical Social Work, Hospice / Palliative Care   Post Acute Care Choice: Skilled Nursing Facility Living arrangements for the past 2 months: Single Family Home                                      Prior Living Arrangements/Services Living arrangements for the past 2 months: Single Family Home Lives with:: Facility Resident Patient language and need for interpreter reviewed:: Yes Do you feel safe going back to the place where you live?: Yes      Need for Family Participation in Patient Care: Yes (Comment) Care giver support system in place?: Yes (comment)   Criminal Activity/Legal Involvement Pertinent to Current Situation/Hospitalization: No - Comment as needed  Activities of Daily Living      Permission Sought/Granted Permission sought to share information with : Facility Medical sales representative, Family  Supports, Guardian Permission granted to share information with : No (Has a legal guardian, from a facility, and has family listed in chart)  Share Information with NAME: Tax inspector granted to share info w AGENCY: [facility]  Permission granted to share info w Relationship: DSS  Permission granted to share info w Contact Information: [phone]  Emotional Assessment Appearance:: Appears stated age Attitude/Demeanor/Rapport: Unable to Assess Affect (typically observed): Unable to Assess Orientation: :  (Disoriented x4) Alcohol / Substance Use: Not Applicable Psych Involvement: No (comment)  Admission diagnosis:  Hepatic encephalopathy (HCC) [K76.82] Acute hepatic encephalopathy (HCC) [K76.82] Sustained horizontal conjugate gaze deviation [H51.0] Patient Active Problem List   Diagnosis Date Noted   Acute hepatic encephalopathy (HCC) 10/20/2023   Alcoholic cirrhosis (HCC) 10/20/2023   Coagulopathy 10/20/2023   Infective endocarditis 10/20/2023   Adrenal insufficiency 10/20/2023   Pressure injury of right ankle, stage 4 (HCC) 10/20/2023   Acute kidney injury 10/11/2023   Prolonged QT interval 10/11/2023   Hyperammonemia 10/11/2023   Acute endocarditis 10/11/2023   Hypothyroidism 10/11/2023   GERD (gastroesophageal reflux disease) 10/11/2023   AKI (acute kidney injury) 10/11/2023   Staphylococcus aureus bacteremia 10/01/2023   Gram-positive bacteremia 09/30/2023   Paroxysmal atrial fibrillation (HCC) 09/29/2023   Hepatic encephalopathy (HCC) 09/29/2023   Acute metabolic encephalopathy 09/25/2023   Hyponatremia 04/04/2021   Esophageal varices (HCC) 04/28/2020   History of colonic polyps 04/28/2020   Alcohol abuse 04/28/2020  Acute shoulder pain due to trauma, right 10/12/2019   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 10/12/2019   Localized osteoarthritis of wrist 10/12/2019   Nontraumatic complete tear of right rotator cuff 10/12/2019    Cocaine abuse (HCC) 09/09/2019   1st degree AV block 08/17/2019   Lyme disease 08/17/2019   Fever of unknown origin 08/16/2019   Elevated AST (SGOT) 08/04/2019   Dyslipidemia 07/14/2019   Iron  deficiency anemia 07/08/2019   Migraine without aura 05/18/2019   Acute on chronic diastolic CHF (congestive heart failure) (HCC) 05/14/2019   Scrotal edema 04/22/2019   Bilateral hydrocele 04/22/2019   Pancytopenia (HCC)    Long-term current use of opiate analgesic 03/21/2019   Seizure-like activity (HCC) 03/21/2019   Meralgia paresthetica of left side 03/21/2019   Polyarthropathy 03/21/2019   Arm mass, right 07/31/2017   Bilateral primary osteoarthritis of knee 04/04/2017   Depression, recurrent 07/18/2016   Encounter for screening for other viral diseases 06/20/2016   Mixed hyperlipidemia 10/31/2015   Leukopenia 07/08/2015   Abdominal wall bulge 03/02/2015   Portal hypertension (HCC) 08/24/2014   Hypokalemia 08/24/2014   COPD, severity to be determined (HCC) 08/24/2014   Biliary colic 07/14/2014   CHF (congestive heart failure) (HCC) 07/14/2014   H/O ETOH abuse 07/14/2014   Thrombocytopenia 07/30/2013   Chronic pain disorder 07/30/2013   Edema 07/30/2013   Genu varus    Hypogonadism male 05/05/2012   Umbilical hernia without obstruction and without gangrene 12/26/2011   Asthma 08/28/2011   Cirrhosis (HCC) 08/14/2011   UNSPECIFIED TACHYCARDIA 02/01/2010   Gout 05/22/2008   Hypertension, essential 05/22/2008   Atrial fibrillation, chronic (HCC) 05/22/2008   DIASTOLIC HEART FAILURE, CHRONIC 05/22/2008   Acute on chronic diastolic heart failure (HCC) 05/22/2008   Morbid obesity with BMI of 50.0-59.9, adult (HCC) 02/21/2007   Coronary atherosclerosis 02/21/2007   Obstructive sleep apnea of adult 02/04/2007   PCP:  Carleen Dallas PARAS, FNP (Inactive) Pharmacy:   [facility] - [place], [place] - [address] [place] [address] Phone:  [phone] Fax: [phone]     Social Drivers of Health (SDOH) Social History: SDOH Screenings   Food Insecurity: Patient Unable To Answer (10/21/2023)  Housing: Low Risk  (09/30/2023)  Transportation Needs: Patient Unable To Answer (09/25/2023)  Utilities: Patient Unable To Answer (09/25/2023)  Recent Concern: Utilities - At Risk (08/18/2023)   Received from [facility]  Depression (PHQ2-9): Low Risk  (03/07/2020)  Financial Resource Strain: Low Risk  (07/22/2023)   Received from [facility]  Physical Activity: Inactive (07/22/2023)   Received from [facility]  Social Connections: Moderately Isolated (07/22/2023)   Received from [facility]  Stress: No Stress Concern Present (08/18/2023)   Received from [facility]  Tobacco Use: High Risk (10/19/2023)  Health Literacy: Low Risk  (07/22/2023)   Received from [facility]   SDOH Interventions:     Readmission Risk Interventions    10/16/2023    1:09 PM 10/03/2023    9:50 AM  Readmission Risk Prevention Plan  Transportation Screening Complete Complete  HRI or Home Care Consult  Complete  Social Work Consult for Recovery Care Planning/Counseling  Complete  Palliative Care Screening  Not Applicable  Medication Review Oceanographer) Complete Complete  HRI or Home Care Consult Complete   SW Recovery Care/Counseling Consult Complete   Palliative Care Screening Complete   Skilled Nursing Facility Complete

## 2023-10-24 NOTE — Progress Notes (Signed)
 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  At [facility] patient was found to have ammonia level of 247 with right gaze preference.  With these findings, teleneurology was consulted. Concern for seizure/stroke but patient not candidate for IV thrombolytics given hx of esophageal varices/thrombocytopenia. Patient was loaded with Keppra and transferred to to [facility] for further neurowork-up as well as EEG monitoring.  TRH consulted for admission.   10/12 patient pended for progressive bed and ultimately transferred to PCU.  Nursing staff can concerned with patient's worsening and ongoing acute encephalopathy.  LTM was reviewed by neurology concerned, originally showing diffuse slowing but around noon patient developed burst suppression pattern but on no burst suppression therapy.  Neurology recommended stat CT of head due to severe low platelet count of 29,000. PCCM consulted to assist with management and transfer patient to ICU for further neuro critical care monitoring.   Upon assessment, patient obtunded, still having right gaze preference.  Also, patient withdrawing from pain in  all 4 extremities.  Patient protecting airway at this time, O2 sats 99% on room air, appears to have dried old blood around oral mucosa.  Chest x-ray obtained, showing no evidence of pneumonia or aspiration at this time.  Placed stat CT of head order, ABG stat, T3/T4, lactic acid, CBC, and CMP.  Notified rapid response RN in regards to ICU transfer as well as stat CT of head to evaluate for intracranial abnormality/cerebral edema.   Patient was transferred from [facility] service to [facility] service on 10/24/2023  Assessment and Plan:  Acute hepatic encephalopathy in setting of alcoholic cirrhosis Acute metabolic encephalopathy Anion GAP Metabolic Acidosis  Hyperammonemia, improving History of esophageal varices Recent admission at [facility] 10/2-10/8 for acute encephalopathy, AKI and presumed endocarditis/MSSA bacteremia. MELD score 30 on 10/20/2023. Ammonia this morning improved to 28 from 44. EEG 10/14 continuous slow generalized low amplitude suggestive of encephalopathy, no seizures. CT imaging thus far with NAICP. Suspect sepsis due to pseudomonas bacteremia also contributing to metabolic encephalopathy.   Continue lactulose  TID for goal 5 stools/day, continue to trend ammonia MRI reviewed by neurologist, negative findings Case discussed with neurologist and they will be available as needed - Discussed with neuro and discontinued Keppra given negative EEG - Continue delirium and aspiration precautions  - Maintain euglycemia, euvolemia, normothermia, and optimize electrolytes  - Continue high dose thiamine  and folic acid  supplementation  - Remains grossly volume overloaded,  Continue spironolactone  and IV Lasix     Paroxysmal atrial fibrillation Currently with rate controlled ventricular response. Normotensive. - Deferring AC given thrombocytopenia   AKI, improving Acute urinary retention - Continue foley - Avoid nephrotoxins, ensure adequate renal perfusion   Anemia of critical  illness Acute on chronic thrombocytopenia-worsening Platelet level 21 today In the setting of alcoholic cirrhosis. Platelets continue to fall, no active signs of  bleeding.  - Continue to trend CBC   Sepsis due to pseudomonas bacteremia Blood cultures 10/12 with pseudomonas aeruginosa.  - Follow-up sensitivities  - Continue cefepime    Infective endocarditis Chronic diastolic heart failure ECHO 9/23 EF 70-75% hyperdynamic function, NRWA, RV systolic function normal. Endocarditis suspected in 9/25 of this year due to MSSA bacteremia. TEE unable to be obtained due to hisotry of esophageal varices.  - On long term antibiotics with cefazolin  until (11/12/2023), changed to cefepime given pseudomonas bacteremia on 10/21/2023 (discussed with ID)   Hypothyroidism - Continue synthroid    Adrenal insufficiency Cortisol level 46.4. - Decreased solucortef from 20 mg BID to 20 mg daily on 10/14    Nutrition-cortrak and TF  DVT prophylaxis-on SCD  GOC-overall prognosis poor, palliative consult and continued to try to reach family/POA. Made 2 physician DNR on 10/14- see attending attestation on PCCM note from that day  Subjective:  Patient seen and examined at bedside this morning Looks acutely ill Palliative care discussing with patient's family concerning goals of care  Physical Exam:  General: chronically ill appearing, obese male, resting in bed HEENT: MM pink/moist, Cortrak in place Neuro: opens eyes spontaneously, moves upper extremities spontaneously and with good strength, no focal deficits, grimaces to painful stimuli in lower extremities  CV: irregularly irregular, normal S1 and S2, no m/r/g PULM: breathing comfortably on room air, breath sounds diminished bilateral bases GI: soft, obese, peri-umbilical hernia, pitting edema in abdomen Extremities: warm/dry, ecchymosis bilateral lower extremities with  edema    Vitals:   10/24/23 0445 10/24/23 0729 10/24/23 1117 10/24/23 1600   BP: 117/76 106/80 106/88 102/78  Pulse: (!) 104 95 87 82  Resp: 14 19 17 17   Temp: 97.9 F (36.6 C) (!) 97.4 F (36.3 C) (!) 96.7 F (35.9 C) (!) 97 F (36.1 C)  TempSrc: Axillary Axillary Axillary Axillary  SpO2: 97% 100% 100% 100%  Weight: (!) 167.2 kg     Height:        Data Reviewed:    Latest Ref Rng & Units 10/24/2023   10:05 AM 10/23/2023    3:28 AM 10/22/2023    2:50 AM  CBC  WBC 4.0 - 10.5 K/uL 4.7  4.5  5.2   Hemoglobin 13.0 - 17.0 g/dL 87.3  89.1  89.8   Hematocrit 39.0 - 52.0 % 38.3  32.4  29.0   Platelets 150 - 400 K/uL 21  26  29         Latest Ref Rng & Units 10/24/2023   10:05 AM 10/23/2023    3:28 AM 10/22/2023    2:50 AM  BMP  Glucose 70 - 99 mg/dL 842  824  894   BUN 6 - 20 mg/dL 40  40  35   Creatinine 0.61 - 1.24 mg/dL 8.54  8.26  8.02   Sodium 135 - 145 mmol/L 142  137  136   Potassium 3.5 - 5.1 mmol/L 4.5  3.3  4.1   Chloride 98 - 111 mmol/L 112  110  107   CO2 22 - 32 mmol/L 19  18  17    Calcium 8.9 - 10.3 mg/dL 8.4  8.2  8.4      Time spent: 45 minutes  Author: Drue ONEIDA Potter, MD 10/24/2023 4:16 PM  For on call review [email or web address].

## 2023-10-24 NOTE — Progress Notes (Signed)
 At approximately 0315 this nurse called [facility] to request order for cardiac telemetry, as previous order had expired and there was no current order.

## 2023-10-24 NOTE — Progress Notes (Signed)
 Daily Progress Note   Date: 10/24/2023   Patient Name: [name]  DOB: 05-15-1965  MRN: [id number]  Age / Sex: 58 y.o., male  Attending Physician: Dorinda Drue DASEN, MD Primary Care Physician: Carleen Dallas PARAS, FNP (Inactive) Admit Date: 10/19/2023 Length of Stay: 4 days  Reason for Follow-up: Establishing goals of care  Past Medical History:  Diagnosis Date   Anxiety    Atrial fibrillation (HCC)    Blood clot in vein    blood clot in portal vein   CHF (congestive heart failure) (HCC)    Cirrhosis (HCC)    NASH   Constipation    COPD (chronic obstructive pulmonary disease) (HCC)    Dysrhythmia    GERD (gastroesophageal reflux disease)    Gout    Leukopenia 07/08/2015   Neuromuscular disorder (HCC)    neuropathy in hands and feet   Psoriasis    RA (rheumatoid arthritis) (HCC)    Sleep apnea    cpap used- level 10 and greater   Thrombocytopenia     Subjective:   Subjective: Chart Reviewed. Updates received. Patient Assessed. Created space and opportunity for patient  and family to explore thoughts and feelings regarding current medical situation.  Today's Discussion: Today before meeting with the patient/family, I reviewed the chart notes including nursing note from today, TOC note from today.  Later in the day I also reviewed hospitalist note from today. I also reviewed vital signs, nursing flowsheets, medication administrations record, labs, and imaging. Labs reviewed include CBC with dramatic thrombocytopenia at 21 today, down from 26 yesterday, in the setting of cirrhosis.  CMP shows continued derangement of liver function testing including increasing AST at 91 today from 53 yesterday, elevated alk phos at 172 from 150 yesterday, elevated total bilirubin at 3.4 today stable from 3.3 yesterday, although improved from peak of 4.7 four days ago.  Today saw the patient at bedside, no family is present.  He is unable to meaningfully communicate.  I spoke extensively  with the bedside nurse and she notes that he pulled out his core track and is not having any intake, urine is becoming quite concentrated.  They are giving him a bolus now.  They are also going to message the hospitalist due to dramatic increase in his heart rate up to 140s.  While is in the room it was 134.  We discussed plan to engage with family to discuss goals of care and see if they can agree to an option.  Medical team at this time seems to be recommending, and I agree, to transition to comfort care given the extremely poor prognosis and the amount of suffering the patient has endured during his hospitalization.  Per bedside nursing he screamed out in pain anytime they move him, his backside is quite excoriated and raw as well.  If family agrees to transition to comfort care we will discuss with the medical team to try to come to a consensus and communicate out to the legal guardian at DSS to see if we can make decisions.  Later in today I reached out to the patient's stepson [employer].  He is the most involved relative who lives locally and DSS is also petitioning to make an official legal guardian.  Per prior discussions with DSS, they welcome involvement of Marinda and decision making although DSS would have final sign off on any changes of care.  I spent time talking to dentist reviewing the history leading up to this current admission.  He understands that he has bad cirrhosis and ammonia goes up which makes him confused so he comes to the hospital gets treated and he gets better and comes back on.  He also knows that he has been having kidney problems, has been in and out of the hospital significantly.  Until recently when his ammonia went up and you are treated his ammonia would come down and he would come back around.  However, he notes at this time he is not waking back up.  He notes that he has been drinking very heavily in the past year or so after approximately 2 years of sobriety.  We  spent time talking about clinical details.  I shared that the patient has been admitted to the hospital 5 times in the last 6 months in our system alone, been to the ER without admission an additional 5 times in the last 6 months in our system alone.  I talked about his ammonia is near normal and he is still obtunded and unresponsive.  We talked about his heart failure, AKI, severe cirrhosis and essentially he is in multisystem organ failure.  Additionally, he is not waking up despite treatments.  He has wounds and sores and when nursing tries to care for him he cries out in pain.  The overall message is that the patient is unlikely to survive very long given his severe and multiple illnesses and he is currently suffering with ongoing treatment.  I made recommendation to consider transition to comfort care to focus on comfort, peace, dignity to allow the patient to pass peacefully.    I explained comfort care as care where the patient would no longer receive aggressive medical interventions such as continuous vital signs, lab work, radiology testing, or medications not focused on comfort, peace, and dignity. This includes stopping antibiotics and weaning oxygen  to room air, as these are generally not accepted as providing comfort but only prolonging the dying process artificially. All care would focus on how the patient is looking and feeling. This would include management of any symptoms that may cause discomfort, pain, shortness of breath/air hunger, increased work of breathing, cough, nausea, agitation/restlessness, anxiety, and/or secretions etc. Symptoms would be managed with medications and other non-pharmacological interventions such as spiritual support if requested, repositioning, music therapy, or therapeutic listening. Family verbalized understanding and agreement.   Marinda is understandably tearful.  He understands the recommendation and seems to except that it is appropriate and necessary.  He would  like to talk to the patient's niece and nephew before making an official opinion.  We decided that the tenderness will call the patient's niece and nephew tonight.  I will follow-up with him tomorrow to further discuss.  If he is in agreement with comfort care then we will call DSS to make the official recommendation and request transition to comfort care as they are emergency legal guardian.  I provided palliative medicine contact number for any questions or concerns.  I provided emotional and general support through therapeutic listening, empathy, sharing of stories, and other techniques. I answered all questions and addressed all concerns to the best of my ability.  Review of Systems  Unable to perform ROS   Objective:   Primary Diagnoses: Present on Admission:  Acute hepatic encephalopathy (HCC)  AKI (acute kidney injury)  Paroxysmal atrial fibrillation (HCC)  Thrombocytopenia  Hypothyroidism   Vital Signs:  BP 106/80 (BP Location: Left Wrist)   Pulse 95   Temp (!) 97.4 F (36.3 C) (  Axillary)   Resp 19   Ht 6' (1.829 m)   Wt (!) 167.2 kg   SpO2 100%   BMI 49.99 kg/m   Physical Exam Vitals and nursing note reviewed.  Constitutional:      General: He is sleeping. He is not in acute distress.    Appearance: He is obese. He is ill-appearing.     Comments: I elected to not attempt to wake him  HENT:     Head: Normocephalic and atraumatic.  Cardiovascular:     Rate and Rhythm: Tachycardia present. Rhythm irregular.  Pulmonary:     Effort: Pulmonary effort is normal. No respiratory distress.  Abdominal:     General: Abdomen is flat. There is no distension.  Skin:    General: Skin is warm and dry.     Coloration: Skin is jaundiced.     Palliative Assessment/Data: 10%   Existing Vynca/ACP Documentation: None  Advanced Care Planning:   Pertinent diagnosis: End-stage cirrhosis, hepatic encephalopathy, thrombocytopenia, varices, infective endocarditis, bacteremia,  HFpEF  The patient and/or family consented to a voluntary Advance Care Planning Conversation in over the phone. Individuals present for the conversation: Patient's stepson Marinda Pinion; Camellia Kays, NP  Summary of the conversation: Discussed the severe clinical situation the patient is in, his extremely poor prognosis, he is essentially in multisystem organ failure and an unfixable situation (not a big picture).  We discussed options moving forward including continued full scope of care versus transition to comfort care.  We discussed the need for DSS to make any final approvals.  Outcome of the conversations and/or documents completed: Time for the patient's stepson to process information, discussed with other family members.  Palliative medicine will follow-up tomorrow for his opinion on transition to comfort care and coordinate with DSS and TOC as appropriate.  I spent 30 minutes providing separately identifiable ACP services with the patient and/or surrogate decision maker in a voluntary, in-person conversation discussing the patient's wishes and goals as detailed in the above note.  Assessment & Plan:   HPI/Patient Profile:  58 y.o. male  with past medical history of EtOH cirrhosis, portal vein thrombosis, thrombocytopenia, esophageal varices with recent admission for hepatic encephalopathy, MSSA bacteremia with infective endocarditis on cefazolin  therapy, adrenal insufficiency who presented with altered mental status.  He was admitted on 10/19/2023 with altered mental status, hepatic encephalopathy, sepsis due to Pseudomonas bacteremia, history of MSSA endocarditis, adrenal insufficiency, AKI.    Palliative medicine was consulted for GOC conversations and attempts to facilitate decision making between medical team and emergency legal guardian.  SUMMARY OF RECOMMENDATIONS   DNR-Limited Continue full scope of care otherwise Patient's stepson/petitioning to be legal guardian will discuss with  other family members Palliative medicine will follow-up with the patient stepson tomorrow for his opinion on transition to comfort care Will update legal guardian as to recommendations for medical care moving forward after obtaining stepson's opinion Palliative medicine will continue to follow  Symptom Management:  Per primary team Palliative medicine is available to assist as needed  Code Status: DNR - Limited (DNR/DNI)  Prognosis: < 3 months  Discharge Planning: To Be Determined  Discussed with: Medical team, nursing team, patient's stepson, TOC team  Thank you for allowing us  to participate in the care of Gaspar Fowle Lamartina PMT will continue to support holistically.  Billing based on MDM: High  Problems Addressed: One or more chronic illnesses with severe exacerbation, progression, or side effects of treatment.  Amount and/or Complexity of Data: Category  1:Review of prior external note(s) from each unique source, Review of the result(s) of each unique test, and Assessment requiring an independent historian(s) and Category 3:Discussion of management or test interpretation with external physician/other qualified health care professional/appropriate source (not separately reported) (discussed with bedside nursing about assessment and legal guardian about history of clinical illness and decline)  Risks: N/A  Detailed review of medical records (labs, imaging, vital signs), medically appropriate exam, discussed with treatment team, counseling and education to patient, family, & staff, documenting clinical information, medication management, coordination of care  Camellia Kays, NP Palliative Medicine Team  Team Phone # [phone] (Nights/Weekends)  09/06/2020, 8:17 AM

## 2023-10-24 NOTE — Plan of Care (Signed)
  Problem: Education: Goal: Knowledge of General Education information will improve Description: Including pain rating scale, medication(s)/side effects and non-pharmacologic comfort measures Outcome: Not Progressing   Problem: Health Behavior/Discharge Planning: Goal: Ability to manage health-related needs will improve Outcome: Not Progressing   Problem: Clinical Measurements: Goal: Ability to maintain clinical measurements within normal limits will improve Outcome: Not Progressing Goal: Will remain free from infection Outcome: Not Progressing Goal: Cardiovascular complication will be avoided Outcome: Not Progressing   Problem: Nutrition: Goal: Adequate nutrition will be maintained Outcome: Not Progressing   Problem: Coping: Goal: Level of anxiety will decrease Outcome: Not Progressing   Problem: Pain Managment: Goal: General experience of comfort will improve and/or be controlled Outcome: Not Progressing   Problem: Skin Integrity: Goal: Risk for impaired skin integrity will decrease Outcome: Not Progressing   Problem: Education: Goal: Ability to describe self-care measures that may prevent or decrease complications (Diabetes Survival Skills Education) will improve Outcome: Not Progressing   Problem: Coping: Goal: Ability to adjust to condition or change in health will improve Outcome: Not Progressing   Problem: Fluid Volume: Goal: Ability to maintain a balanced intake and output will improve Outcome: Not Progressing   Problem: Health Behavior/Discharge Planning: Goal: Ability to identify and utilize available resources and services will improve Outcome: Not Progressing Goal: Ability to manage health-related needs will improve Outcome: Not Progressing   Problem: Nutritional: Goal: Maintenance of adequate nutrition will improve Outcome: Not Progressing   Problem: Skin Integrity: Goal: Risk for impaired skin integrity will decrease Outcome: Not Progressing

## 2023-10-25 DIAGNOSIS — Z7189 Other specified counseling: Secondary | ICD-10-CM | POA: Diagnosis not present

## 2023-10-25 DIAGNOSIS — Z789 Other specified health status: Secondary | ICD-10-CM

## 2023-10-25 DIAGNOSIS — R4589 Other symptoms and signs involving emotional state: Secondary | ICD-10-CM

## 2023-10-25 DIAGNOSIS — Z66 Do not resuscitate: Secondary | ICD-10-CM | POA: Diagnosis not present

## 2023-10-25 DIAGNOSIS — K7682 Hepatic encephalopathy: Secondary | ICD-10-CM | POA: Diagnosis not present

## 2023-10-25 DIAGNOSIS — H510 Palsy (spasm) of conjugate gaze: Secondary | ICD-10-CM

## 2023-10-25 DIAGNOSIS — L89514 Pressure ulcer of right ankle, stage 4: Secondary | ICD-10-CM

## 2023-10-25 DIAGNOSIS — Z515 Encounter for palliative care: Secondary | ICD-10-CM | POA: Diagnosis not present

## 2023-10-25 LAB — COMPREHENSIVE METABOLIC PANEL WITH GFR
ALT: 10 U/L (ref 0–44)
AST: 96 U/L — ABNORMAL HIGH (ref 15–41)
Albumin: 1.8 g/dL — ABNORMAL LOW (ref 3.5–5.0)
Alkaline Phosphatase: 174 U/L — ABNORMAL HIGH (ref 38–126)
Anion gap: 12 (ref 5–15)
BUN: 44 mg/dL — ABNORMAL HIGH (ref 6–20)
CO2: 17 mmol/L — ABNORMAL LOW (ref 22–32)
Calcium: 8.6 mg/dL — ABNORMAL LOW (ref 8.9–10.3)
Chloride: 112 mmol/L — ABNORMAL HIGH (ref 98–111)
Creatinine, Ser: 1.27 mg/dL — ABNORMAL HIGH (ref 0.61–1.24)
GFR, Estimated: >60 mL/min (ref >60)
Glucose, Bld: 127 mg/dL — ABNORMAL HIGH (ref 70–99)
Potassium: 4.2 mmol/L (ref 3.5–5.1)
Sodium: 141 mmol/L (ref 135–145)
Total Bilirubin: 3.7 mg/dL — ABNORMAL HIGH (ref 0.0–1.2)
Total Protein: 6 g/dL — ABNORMAL LOW (ref 6.5–8.1)

## 2023-10-25 LAB — AMMONIA: Ammonia: 41 umol/L — ABNORMAL HIGH (ref 9–35)

## 2023-10-25 LAB — CBC
HCT: 38 % — ABNORMAL LOW (ref 39.0–52.0)
Hemoglobin: 12.5 g/dL — ABNORMAL LOW (ref 13.0–17.0)
MCH: 31.5 pg (ref 26.0–34.0)
MCHC: 32.9 g/dL (ref 30.0–36.0)
MCV: 95.7 fL (ref 80.0–100.0)
Platelets: 17 K/uL — CL (ref 150–400)
RBC: 3.97 MIL/uL — ABNORMAL LOW (ref 4.22–5.81)
RDW: 21.4 % — ABNORMAL HIGH (ref 11.5–15.5)
WBC: 4.5 K/uL (ref 4.0–10.5)
nRBC: 0.4 % — ABNORMAL HIGH (ref 0.0–0.2)

## 2023-10-25 LAB — GLUCOSE, CAPILLARY
Glucose-Capillary: 103 mg/dL — ABNORMAL HIGH (ref 70–99)
Glucose-Capillary: 106 mg/dL — ABNORMAL HIGH (ref 70–99)
Glucose-Capillary: 117 mg/dL — ABNORMAL HIGH (ref 70–99)
Glucose-Capillary: 133 mg/dL — ABNORMAL HIGH (ref 70–99)

## 2023-10-25 LAB — PHOSPHORUS: Phosphorus: 2.6 mg/dL (ref 2.5–4.6)

## 2023-10-25 LAB — MAGNESIUM: Magnesium: 1.8 mg/dL (ref 1.7–2.4)

## 2023-10-25 MED ORDER — ONDANSETRON HCL 4 MG/2ML IJ SOLN: 4.0000 mg | Freq: Four times a day (QID) | INTRAMUSCULAR | Status: DC | PRN

## 2023-10-25 MED ORDER — LORAZEPAM 2 MG/ML IJ SOLN
1.0000 mg | INTRAMUSCULAR | Status: DC | PRN
  Administered 2023-10-29: 1 mg via INTRAVENOUS
  Filled 2023-10-25: qty 1

## 2023-10-25 MED ORDER — HALOPERIDOL 1 MG PO TABS
0.5000 mg | ORAL_TABLET | ORAL | Status: DC | PRN
  Filled 2023-10-25: qty 1

## 2023-10-25 MED ORDER — METOPROLOL TARTRATE 5 MG/5ML IV SOLN
INTRAVENOUS | Status: AC
  Filled 2023-10-25: qty 5

## 2023-10-25 MED ORDER — LORAZEPAM 2 MG/ML PO CONC: 1.0000 mg | ORAL | Status: DC | PRN

## 2023-10-25 MED ORDER — MORPHINE SULFATE (PF) 2 MG/ML IV SOLN: 1.0000 mg | INTRAVENOUS | Status: DC | PRN

## 2023-10-25 MED ORDER — GLYCOPYRROLATE 1 MG PO TABS
1.0000 mg | ORAL_TABLET | ORAL | Status: DC | PRN
  Filled 2023-10-25: qty 1

## 2023-10-25 MED ORDER — BIOTENE DRY MOUTH MT LIQD: 15.0000 mL | OROMUCOSAL | Status: DC | PRN

## 2023-10-25 MED ORDER — GLYCOPYRROLATE 0.2 MG/ML IJ SOLN: 0.2000 mg | INTRAMUSCULAR | Status: DC | PRN

## 2023-10-25 MED ORDER — LORAZEPAM 1 MG PO TABS: 1.0000 mg | ORAL_TABLET | ORAL | Status: DC | PRN

## 2023-10-25 MED ORDER — POLYVINYL ALCOHOL 1.4 % OP SOLN
1.0000 [drp] | Freq: Four times a day (QID) | OPHTHALMIC | Status: DC | PRN
  Administered 2023-11-03: 1 [drp] via OPHTHALMIC
  Filled 2023-10-25 (×2): qty 15

## 2023-10-25 MED ORDER — ONDANSETRON 4 MG PO TBDP: 4.0000 mg | ORAL_TABLET | Freq: Four times a day (QID) | ORAL | Status: DC | PRN

## 2023-10-25 MED ORDER — HALOPERIDOL LACTATE 2 MG/ML PO CONC
0.5000 mg | ORAL | Status: DC | PRN
  Filled 2023-10-25: qty 5

## 2023-10-25 MED ORDER — GLYCOPYRROLATE 0.2 MG/ML IJ SOLN
0.2000 mg | INTRAMUSCULAR | Status: DC | PRN
  Administered 2023-11-03: 0.2 mg via INTRAVENOUS
  Filled 2023-10-25: qty 1

## 2023-10-25 MED ORDER — HALOPERIDOL LACTATE 5 MG/ML IJ SOLN
0.5000 mg | INTRAMUSCULAR | Status: DC | PRN
Start: 2023-10-25 — End: 2023-11-03
  Administered 2023-10-29: 0.5 mg via INTRAVENOUS
  Filled 2023-10-25: qty 1

## 2023-10-25 NOTE — Progress Notes (Signed)
Ice chips given to patient.  Patient tolerated well.

## 2023-10-25 NOTE — Progress Notes (Signed)
 NEUROLOGY CONSULT FOLLOW UP NOTE   Date of service: October 25, 2023 Patient Name: [name] MRN:  [id number] DOB:  09/07/1965  Interval Hx/subjective   Transferred down to step-down.  MRI brain w/o anoxic findings or other acute findings.  Ammonia has normalized.  Vitals   Vitals:   10/25/23 0019 10/25/23 0137 10/25/23 0152 10/25/23 0413  BP: 112/86 112/67  122/86  Pulse: (!) 117 (!) 55 (!) 102 (!) 106  Resp: 16 14 15 15   Temp: (!) 97.5 F (36.4 C) 97.7 F (36.5 C)  97.6 F (36.4 C)  TempSrc: Axillary Axillary  Oral  SpO2: 100% 100% 100% 99%  Weight:    (!) 165.9 kg  Height:         Body mass index is 49.6 kg/m.  Physical Exam   Constitutional: Lying in bed with eyes closed, in no acute distress. Eyes: No scleral injection.  HENT: No OP obstrucion.  Head: Normocephalic.  Cardiovascular: Warm and well perfused. Respiratory: Effort normal, non-labored breathing.  GI: Distended but soft. Skin: Multiple bruises and scabbing on limbs.  Neurologic Examination   Mental status: opens eyes to noxious. Answers 2025 to year but does not respond to other questions. Does not open eyes unless noxious applied. Speech: mild dysarthria Cranial nerves: EOMI Face symmetric at rest and with activation. Motor: BUE: localizes to noxious, will then follow commands antigravity  BLE: withdraws to noxious, will then wiggle toes Sensory: Grossly intact to light touch throughout. Reflexes: DTR's deferred. Coordination: Deferred Gait: deferred   Medications  Current Facility-Administered Medications:    acetaminophen  (TYLENOL ) tablet 650 mg, 650 mg, Per Tube, Q6H PRN **OR** acetaminophen  (TYLENOL ) suppository 650 mg, 650 mg, Rectal, Q6H PRN, Mannam, Praveen, MD   albuterol  (PROVENTIL ) (2.5 MG/3ML) 0.083% nebulizer solution 2.5 mg, 2.5 mg, Nebulization, Q4H PRN, Shalhoub, George J, MD   ceFEPIme (MAXIPIME) 2 g in sodium chloride  0.9 % 100 mL IVPB, 2 g, Intravenous, Q8H,  Manandhar, Sabina, MD, Last Rate: 200 mL/hr at 10/25/23 0141, 2 g at 10/25/23 0141   Chlorhexidine  Gluconate Cloth 2 % PADS 6 each, 6 each, Topical, Daily, Maree Harder, MD, 6 each at 10/24/23 9041   collagenase (SANTYL) ointment, , Topical, Daily, Maree Harder, MD, Given at 10/24/23 0958   feeding supplement (OSMOLITE 1.5 CAL) liquid 1,000 mL, 1,000 mL, Per Tube, Continuous, Mannam, Praveen, MD, Stopped at 10/23/23 1715   feeding supplement (PROSource TF20) liquid 60 mL, 60 mL, Per Tube, BID, Mannam, Praveen, MD, 60 mL at 10/23/23 1046   fluticasone  (FLONASE ) 50 MCG/ACT nasal spray 2 spray, 2 spray, Each Nare, Daily PRN, Shalhoub, Zachary PARAS, MD   furosemide  (LASIX ) injection 40 mg, 40 mg, Intravenous, BID, Djan, Prince T, MD, 40 mg at 10/24/23 1743   Gerhardt's butt cream, , Topical, TID, Maree Harder, MD, Given at 10/24/23 2140   hydrocerin (EUCERIN) cream, , Topical, BID, Maree Harder, MD, Given at 10/24/23 2142   hydrocortisone  sodium succinate  (SOLU-CORTEF ) 100 MG injection 20 mg, 20 mg, Intravenous, Daily, Gleason, Leita SAUNDERS, PA-C, 20 mg at 10/24/23 9043   insulin  aspart (novoLOG ) injection 0-6 Units, 0-6 Units, Subcutaneous, Q4H, Ogan, Okoronkwo U, MD, 1 Units at 10/24/23 0010   lactulose  (CHRONULAC ) 10 GM/15ML solution 20 g, 20 g, Per Tube, TID, Mannam, Praveen, MD, 20 g at 10/23/23 1700   levothyroxine  (SYNTHROID ) tablet 125 mcg, 125 mcg, Per Tube, Q0600, Gleason, Leita SAUNDERS, PA-C, 125 mcg at 10/23/23 0533   metoprolol  tartrate (LOPRESSOR ) injection 5 mg, 5 mg,  Intravenous, Q5 min PRN, Arloa Folks D, NP, 5 mg at 10/24/23 1141   mupirocin ointment (BACTROBAN) 2 % 1 Application, 1 Application, Nasal, BID, Maree Harder, MD, 1 Application at 10/24/23 2142   Oral care mouth rinse, 15 mL, Mouth Rinse, PRN, Maree Harder, MD, 15 mL at 10/20/23 2233   oxyCODONE (Oxy IR/ROXICODONE) immediate release tablet 5 mg, 5 mg, Per Tube, Q6H PRN, Mannam, Praveen, MD, 5 mg at 10/23/23 0451   pantoprazole   (PROTONIX ) injection 40 mg, 40 mg, Intravenous, Q12H, Shalhoub, Zachary PARAS, MD, 40 mg at 10/24/23 2140   spironolactone  (ALDACTONE ) tablet 12.5 mg, 12.5 mg, Oral, Daily, Wilson, Tara N, PA-C, 12.5 mg at 10/23/23 1156   [COMPLETED] thiamine  (VITAMIN B1) 500 mg in sodium chloride  0.9 % 50 mL IVPB, 500 mg, Intravenous, Q8H, Stopped at 10/21/23 2305 **FOLLOWED BY** thiamine  (VITAMIN B1) 250 mg in sodium chloride  0.9 % 50 mL IVPB, 250 mg, Intravenous, Daily, Last Rate: 105 mL/hr at 10/24/23 1402, 250 mg at 10/24/23 1402 **FOLLOWED BY** [START ON 10/28/2023] thiamine  (VITAMIN B1) injection 100 mg, 100 mg, Intravenous, Daily, Vanessa Robert, MD  Labs and Diagnostic Imaging   CBC:  Recent Labs  Lab 10/19/23 2144 10/20/23 0923 10/20/23 1916 10/21/23 0550 10/23/23 0328 10/24/23 1005  WBC 7.4   < > 8.2   < > 4.5 4.7  NEUTROABS 6.1  --  7.4  --   --   --   HGB 11.3*   < > 12.1*   < > 10.8* 12.6*  HCT 33.6*   < > 35.2*   < > 32.4* 38.3*  MCV 92.3   < > 91.7   < > 93.9 96.0  PLT 38*   < > 31*   < > 26* 21*   < > = values in this interval not displayed.    Basic Metabolic Panel:  Lab Results  Component Value Date   NA 142 10/24/2023   K 4.5 10/24/2023   CO2 19 (L) 10/24/2023   GLUCOSE 157 (H) 10/24/2023   BUN 40 (H) 10/24/2023   CREATININE 1.45 (H) 10/24/2023   CALCIUM 8.4 (L) 10/24/2023   GFRNONAA 55 (L) 10/24/2023   GFRAA 90 02/22/2020   Lipid Panel:  Lab Results  Component Value Date   LDLCALC 65 11/16/2019   HgbA1c:  Lab Results  Component Value Date   HGBA1C 4.7 (L) 10/23/2023   Urine Drug Screen:     Component Value Date/Time   LABOPIA NONE DETECTED 09/26/2023 0844   COCAINSCRNUR NONE DETECTED 09/26/2023 0844   LABBENZ NONE DETECTED 09/26/2023 0844   AMPHETMU NONE DETECTED 09/26/2023 0844   THCU NONE DETECTED 09/26/2023 0844   LABBARB NONE DETECTED 09/26/2023 0844    Alcohol Level     Component Value Date/Time   ETH <15 10/19/2023 2144   INR  Lab Results   Component Value Date   INR 2.5 (H) 10/21/2023   APTT  Lab Results  Component Value Date   APTT 75 (H) 10/19/2023   AED levels: No results found for: PHENYTOIN, ZONISAMIDE, LAMOTRIGINE, LEVETIRACETA  MRI Brain(10/23/23, Personally reviewed): No acute abnormality.  CT head(10/20/23, Personally reviewed): No acute abnormality. No edema.  Assessment   [name] is a 58 y.o. male with cirrhosis, history of hepatic encephalopathy, thrombocytopenia, afib, MSSA bacteremia,  who presented with progressive confusion at his facility and found to have ammonia level of 247. Due to concern of right gaze deviation as a result of stroke, transferred to [facility] from [facility].  At [facility], did not receive TNK as OOW and with thrombocytopenia, thrombectomy due to concern for endocarditis & unable to obtain TEE due to esophageal varices. Upon initial evaluation at [facility], found to have roving eye movements rather than R gaze & all brainstem reflexes intact. Overall clinical presentation consistent with encephalopathy due to hyperammonemia. Therefore, lactulose  enemas recommended to decrease ammonia. Since then has had profound decrease of ammonia as well as improvement in exam, however continues to be drowsy. MRI brain reassuringly without acute process.  Recommendations  - Continue to optimize medically. ______________________________________________________________________   Signed, Welles Walthall M Jerardo Costabile, MD [facility]

## 2023-10-25 NOTE — Plan of Care (Signed)
  Problem: Coping: Goal: Level of anxiety will decrease Outcome: Progressing   Problem: Elimination: Goal: Will not experience complications related to bowel motility Outcome: Progressing Goal: Will not experience complications related to urinary retention Outcome: Progressing   Problem: Pain Managment: Goal: General experience of comfort will improve and/or be controlled Outcome: Progressing   Problem: Safety: Goal: Ability to remain free from injury will improve Outcome: Progressing   Problem: Skin Integrity: Goal: Risk for impaired skin integrity will decrease Outcome: Progressing   Problem: Metabolic: Goal: Ability to maintain appropriate glucose levels will improve Outcome: Progressing   Problem: Tissue Perfusion: Goal: Adequacy of tissue perfusion will improve Outcome: Progressing   Problem: Education: Goal: Knowledge of General Education information will improve Description: Including pain rating scale, medication(s)/side effects and non-pharmacologic comfort measures Outcome: Not Progressing   Problem: Health Behavior/Discharge Planning: Goal: Ability to manage health-related needs will improve Outcome: Not Progressing   Problem: Activity: Goal: Risk for activity intolerance will decrease Outcome: Not Progressing   Problem: Nutrition: Goal: Adequate nutrition will be maintained Outcome: Not Progressing   Problem: Education: Goal: Individualized Educational Video(s) Outcome: Not Progressing   Problem: Health Behavior/Discharge Planning: Goal: Ability to identify and utilize available resources and services will improve Outcome: Not Progressing Goal: Ability to manage health-related needs will improve Outcome: Not Progressing

## 2023-10-25 NOTE — TOC Progression Note (Signed)
 Transition of Care [facility]) - Progression Note    Patient Details  Name: [name] MRN: [id number] Date of Birth: [birth year]  Transition of Care [facility]) CM/SW Contact  Luise JAYSON Pan, [place] Phone Number: 10/25/2023, 11:18 AM  Clinical Narrative:   Per Palliative, patient is now comfort care. Patient expressed to palliative that he wishes to remain in hospital.   CSW will continue to follow.    Expected Discharge Plan: Skilled Nursing Facility Barriers to Discharge: Continued Medical Work up               Expected Discharge Plan and Services In-house Referral: Clinical Social Work, Hospice / Palliative Care   Post Acute Care Choice: Skilled Nursing Facility Living arrangements for the past 2 months: Single Family Home                                       Social Drivers of Health (SDOH) Interventions SDOH Screenings   Food Insecurity: Patient Unable To Answer (10/21/2023)  Housing: Low Risk  (09/30/2023)  Transportation Needs: Patient Unable To Answer (09/25/2023)  Utilities: Patient Unable To Answer (09/25/2023)  Recent Concern: Utilities - At Risk (08/18/2023)   Received from [facility]  Depression (PHQ2-9): Low Risk  (03/07/2020)  Financial Resource Strain: Low Risk  (07/22/2023)   Received from [facility]  Physical Activity: Inactive (07/22/2023)   Received from [facility]  Social Connections: Moderately Isolated (07/22/2023)   Received from [facility]  Stress: No Stress Concern Present (08/18/2023)   Received from [facility]  Tobacco Use: High Risk (10/19/2023)  Health Literacy: Low Risk  (07/22/2023)   Received from [facility]    Readmission Risk Interventions    10/16/2023    1:09 PM 10/03/2023    9:50 AM  Readmission Risk Prevention Plan  Transportation Screening Complete Complete  HRI or Home Care Consult  Complete  Social Work Consult for Recovery Care Planning/Counseling  Complete  Palliative Care Screening  Not  Applicable  Medication Review Oceanographer) Complete Complete  HRI or Home Care Consult Complete   SW Recovery Care/Counseling Consult Complete   Palliative Care Screening Complete   Skilled Nursing Facility Complete

## 2023-10-25 NOTE — Progress Notes (Signed)
 Patient arrived. Cooperative. Requesting for covers to be removed. No complaints of pain. No S&S of distress at this time. [name]

## 2023-10-25 NOTE — Progress Notes (Addendum)
 Daily Progress Note   Date: 10/25/2023   Patient Name: [name]  DOB: [birth year]  MRN: [id number]  Age / Sex: 58 y.o., male  Attending Physician: Kathrin Mignon DASEN, MD Primary Care Physician: Carleen Dallas PARAS, FNP (Inactive) Admit Date: 10/19/2023 Length of Stay: 5 days  Reason for Follow-up: Establishing goals of care  Past Medical History:  Diagnosis Date   Anxiety    Atrial fibrillation (HCC)    Blood clot in vein    blood clot in portal vein   CHF (congestive heart failure) (HCC)    Cirrhosis (HCC)    NASH   Constipation    COPD (chronic obstructive pulmonary disease) (HCC)    Dysrhythmia    GERD (gastroesophageal reflux disease)    Gout    Leukopenia 07/08/2015   Neuromuscular disorder (HCC)    neuropathy in hands and feet   Psoriasis    RA (rheumatoid arthritis) (HCC)    Sleep apnea    cpap used- level 10 and greater   Thrombocytopenia     Subjective:   Subjective: Chart Reviewed. Updates received. Patient Assessed. Created space and opportunity for patient  and family to explore thoughts and feelings regarding current medical situation.  Today's Discussion: Today before meeting with the patient/family, I reviewed the chart notes including hospice note from yesterday, nursing note from yesterday, neurology note from yesterday. I also reviewed vital signs, nursing flowsheets, medication administrations record, labs, and imaging. Labs reviewed include CBC with dramatic thrombocytopenia that is continuing to decline now at 17, down from 21 yesterday and hospitalization peak of 57, in the setting of cirrhosis.  CMP shows continued derangement of liver function testing including increasing AST at 96 today from 91 yesterday, elevated alk phos at 174 from 172 yesterday, elevated total bilirubin at 3. 7 today increased from 3.4 yesterday, although improved from peak of 4.7 five days ago.  Today saw the patient at bedside, no family is present.  Dr. Gonfa from the  hospitalist team is also present and examining the patient.  We reviewed his significant skin issues, wounds, discomfort.  He is only able to tell us  his last name and hospital today but no further information.  This is more alert than he has been, but still quite attempted.  He is not opening his eyes and making eye contact.  He is unable to meaningfully communicate.  Per previous conversations with the bedside nurse he pulled out his core track and is not having any intake.  He remains tachycardic in A-fib in the 130s during my visit.  I discussed with Dr. Gonfa outside the patient room and the medical team continues to recommend transition to comfort care given the extremely poor prognosis and the amount of suffering the patient has endured during his hospitalization.  We discussed yesterday's conversation with the patient's stepson, whom DSS is petitioning to be the permanent legal guardian.  He seems to understand the situation and seems to be leaning towards transition to comfort care opinion, but was planning to discuss with the patient's niece and nephew last night.  I am planning to call him today.  After seeing the patient I called and spoke with his mertha Endow.  Endow tells me that he was able to speak to other family members yesterday and they are in agreement with transition to comfort care.  He called spoke with the DSS legal guardian Everett and conveyed this to her as well.  I informed her that I would reach out  to DSS to try to get verbal consent for the transition and with keeping up-to-date on decisions made.  I spent time allowing him to discuss the grief he is facing and the difficulty with the impending loss of his stepfather. I provided emotional and general support through therapeutic listening, empathy, sharing of stories, and other techniques. I answered all questions and addressed all concerns to the best of my ability.  After speaking with Marinda I called and spoke with DSS  legal guardian Everett and conveyed recommendation for transition of medical care from the multidisciplinary medical team along with agreement from family/petitioned legal guardian Marinda who is in agreement.  Jeanine got the DSS program manager Mont Bohr on the phone.  I explained the dire clinical situation and recommendation for transition to comfort care and what this would entail.  [facility] DSS program manager Mont Bohr provided verbal consent to transition this patient to comfort care for end-of-life.  I shared that per family request the patient will remain inpatient for in-hospital death.  All are in agreement.  Afterward I updated the medical team and nursing teams about transition to comfort care.  Palliative medicine will continue to follow.  Review of Systems  Unable to perform ROS   Objective:   Primary Diagnoses: Present on Admission:  Acute hepatic encephalopathy (HCC)  AKI (acute kidney injury)  Paroxysmal atrial fibrillation (HCC)  Thrombocytopenia  Hypothyroidism   Vital Signs:  BP 122/86 (BP Location: Left Wrist)   Pulse (!) 106   Temp 97.6 F (36.4 C) (Oral)   Resp 15   Ht 6' (1.829 m)   Wt (!) 165.9 kg   SpO2 99%   BMI 49.60 kg/m   Physical Exam Vitals and nursing note reviewed.  Constitutional:      General: He is sleeping. He is not in acute distress.    Appearance: He is obese. He is ill-appearing.     Comments: I elected to not attempt to wake him  HENT:     Head: Normocephalic and atraumatic.  Cardiovascular:     Rate and Rhythm: Tachycardia present. Rhythm irregular.     Comments: Heart rate 130s Pulmonary:     Effort: Pulmonary effort is normal. No respiratory distress.  Abdominal:     General: Abdomen is flat. There is no distension.  Skin:    Coloration: Skin is jaundiced.     Comments: Significant groin skin excoriations, lower extremity lesions, wounds  Neurological:     Comments: Able to state his last name, otherwise  disoriented     Palliative Assessment/Data: 10%   Existing Vynca/ACP Documentation: None  Assessment & Plan:   HPI/Patient Profile:  58 y.o. male  with past medical history of EtOH cirrhosis, portal vein thrombosis, thrombocytopenia, esophageal varices with recent admission for hepatic encephalopathy, MSSA bacteremia with infective endocarditis on cefazolin  therapy, adrenal insufficiency who presented with altered mental status.  He was admitted on 10/19/2023 with altered mental status, hepatic encephalopathy, sepsis due to Pseudomonas bacteremia, history of MSSA endocarditis, adrenal insufficiency, AKI.    Palliative medicine was consulted for GOC conversations and attempts to facilitate decision making between medical team and emergency legal guardian.  SUMMARY OF RECOMMENDATIONS   DNR-comfort Transition to comfort care See symptom management orders below Per family request, patient to remain for in-hospital death Ongoing support of patient's family Palliative medicine will continue to follow for comfort care/symptom management  Symptom Management:  Chane 1650 mg PR every 6 hours as needed pain or fever  Biotene solution 15 mL topical as needed dry mouth Artificial tears 1 drop OU 4 times daily as needed dry eyes Robinul  0.2 mg IV every 4 hours as needed excessive secretions Haldol 0.5 mg IV every 4 hours as needed agitation or delirium Ativan  1 mg IV every 4 hours as needed anxiety Morphine  1 to 4 mg IV every 15 minutes as needed severe pain (7-10), sign/symptoms of distress Zofran  4 mg IV every 6 hours as needed nausea  Code Status: DNR - Comfort  Prognosis: < 2 weeks  Discharge Planning: Anticipated Hospital Death  Discussed with: Medical team, nursing team, patient's stepson, [facility] team, DSS legal guardian, DSS program manager  Thank you for allowing us  to participate in the care of Derreon D Labine PMT will continue to support holistically.  Billing based on MDM:  High  Problems Addressed: One or more chronic illnesses with severe exacerbation, progression, or side effects of treatment.  Risks: Parenteral controlled substances and Decision not to resuscitate or to de-escalate care because of poor prognosis  Detailed review of medical records (labs, imaging, vital signs), medically appropriate exam, discussed with treatment team, counseling and education to patient, family, & staff, documenting clinical information, medication management, coordination of care  Camellia Kays, NP Palliative Medicine Team  Team Phone # [phone] (Nights/Weekends)  09/06/2020, 8:17 AM

## 2023-10-25 NOTE — Plan of Care (Signed)

## 2023-10-25 NOTE — Progress Notes (Signed)
 [employer] (legal guardian), called and updated on patients transfer location.

## 2023-10-25 NOTE — Progress Notes (Signed)
   10/25/23 0137  Assess: MEWS Score  Temp 97.7 F (36.5 C)  BP 112/67  MAP (mmHg) 80  Pulse Rate (!) 55  ECG Heart Rate (!) 133  Resp 14  Level of Consciousness Responds to Voice  SpO2 100 %  O2 Device Room Air  Assess: MEWS Score  MEWS Temp 0  MEWS Systolic 0  MEWS Pulse 3  MEWS RR 0  MEWS LOC 1  MEWS Score 4  MEWS Score Color Red  Assess: if the MEWS score is Yellow or Red  Were vital signs accurate and taken at a resting state? Yes  Does the patient meet 2 or more of the SIRS criteria? No  MEWS guidelines implemented  Yes, red  Treat  MEWS Interventions Considered administering scheduled or prn medications/treatments as ordered  Take Vital Signs  Increase Vital Sign Frequency  Red: Q1hr x2, continue Q4hrs until patient remains green for 12hrs  Escalate  MEWS: Escalate Red: Discuss with charge nurse and notify provider. Consider notifying RRT. If remains red for 2 hours consider need for higher level of care  Notify: Charge Nurse/RN  Name of Charge Nurse/RN Notified Production manager  Assess: SIRS CRITERIA  SIRS Temperature  0  SIRS Respirations  0  SIRS Pulse 1  SIRS WBC 0  SIRS Score Sum  1   Metoprolol  5mg  IV given, will continue to monitor.

## 2023-10-25 NOTE — Progress Notes (Signed)
 PROGRESS NOTE  [name] FMW:[id number] DOB: 1965-01-12   PCP: Carleen Dallas PARAS, FNP (Inactive)  Patient is from: SNF  DOA: 10/19/2023 LOS: 5  Chief complaints Chief Complaint  Patient presents with   Altered Mental Status     Brief Narrative / Interim history: 58 year old M with PMH of cirrhosis with mesenteric/portal vein thrombosis, esophageal varices, thrombocytopenia, hepatic encephalopathy, HFpEF, A-fib not on AC, adrenal insufficiency, hypertension MSSA bacteremia with presumed infective endocarditis on IV Ancef  and recent hospitalization from 10/2-10/8 at [facility] with acute hepatic encephalopathy and AKI return to [facility] with increased confusion and right gaze preference.  Ammonia level was elevated to 247.  VBG without significant finding.  Teleneurology consulted.  There was concern about seizure/CVA but patient was not a candidate for IV thrombolytics given esophageal varices and thrombocytopenia.  CT head and CT angio head and neck without acute finding but concern for bilateral bronchopneumonia.  He was loaded with Keppra and transferred to [facility] for neuro workup and EEG monitoring.  Further workup revealed Pseudomonas bacteremia.  Antibiotic escalated to IV cefepime after discussion with ID.   Patient has had worsening encephalopathy despite improvement in his ammonia level.  LTM was reviewed by neurology concerned, originally showing diffuse slowing but around noon patient developed burst suppression pattern but on no burst suppression therapy.  Neurology recommended stat CT of head due to severe low platelet count of 29,000.  PCCM was consulted and he was moved to ICU on 10/12.  Repeat CT head without acute finding.  MRI brain on 10/15 without acute finding.  Keppra discontinued.  Patient remained encephalopathic.  Palliative medicine involved.  Patient was transferred to hospitalist service on 10/16.  He was transition to full comfort care on  10/17 per discussion and lung palliative medicine, DSS (legal guardian) and stepson.  Anticipate in-hospital death  Subjective: Seen and examined earlier this morning.  No major events overnight or this morning.  Patient is encephalopathic.  Wakes to voice.  Able to tell me his name and hospital.   Assessment and plan: End-of-life care/full comfort care-initiated on 10/17 by palliative medicine after discussion with DSS and stepson. - Appreciate help by palliative medicine - Comfort meds per palliative. - Anticipate in hospital days.  Decompensated liver cirrhosis Acute hepatic encephalopathy Acute metabolic encephalopathy Anion GAP Metabolic Acidosis  Hyperammonemia, improving History of esophageal varices Anemia of critical illness Acute on chronic thrombocytopenia Anasarca   Paroxysmal atrial fibrillation   AKI/acute urinary retention - Continue foley for comfort care.   Sepsis due to pseudomonas bacteremia   Infective endocarditis Chronic diastolic heart failure   Hypothyroidism   Adrenal insufficiency    DVT prophylaxis-full comfort care.   Nutrition/inadequate oral intake Body mass index is 49.6 kg/m. Nutrition Problem: Inadequate oral intake Etiology: inability to eat Signs/Symptoms: NPO status Interventions: Tube feeding  Pressure skin injury: Present on arrival. Wound 09/27/23 1212 Pressure Injury Sacrum Stage 1 -  Intact skin with non-blanchable redness of a localized area usually over a bony prominence. (Active)     Wound 10/10/23 2235 Pressure Injury Heel Right Unstageable - Full thickness tissue loss in which the base of the injury is covered by slough (yellow, tan, gray, green or brown) and/or eschar (tan, brown or black) in the wound bed. (Active)     Wound 10/20/23 Pressure Injury Buttocks Right;Left Stage 3 -  Full thickness tissue loss. Subcutaneous fat may be visible but bone, tendon or muscle are NOT exposed. (Active)  Code Status:  DNR-comfort Family Communication: None at bedside Level of care: Med-Surg Status is: Inpatient Remains inpatient appropriate because: End-of-life care   Final disposition: Anticipated in hospital death   55 minutes with more than 50% spent in reviewing records, counseling patient/family and coordinating care.  Consultants:  Neurology Infectious disease Palliative medicine  Procedures: None  Microbiology summarized: 10/12-blood culture with Pseudomonas aeruginosa  Objective: Vitals:   10/25/23 0137 10/25/23 0152 10/25/23 0413 10/25/23 0900  BP: 112/67  122/86 (!) 124/92  Pulse: (!) 55 (!) 102 (!) 106 60  Resp: 14 15 15 20   Temp: 97.7 F (36.5 C)  97.6 F (36.4 C) 97.7 F (36.5 C)  TempSrc: Axillary  Oral Oral  SpO2: 100% 100% 99% 99%  Weight:   (!) 165.9 kg   Height:        Examination:  GENERAL: No apparent distress.  HEENT: MMM.  Vision and hearing grossly intact.  NECK: Supple.  No apparent JVD.  RESP:  No IWOB.  Fair aeration bilaterally. CVS:  RRR. Heart sounds normal.  ABD/GI/GU: BS+. Abd soft, NTND.  Scrotal edema. MSK/EXT:  Moves  extremities.  Edema/anasarca. SKIN: no apparent skin lesion or wound NEURO: Somnolent.  Wakes to voice.  Oriented to self and hospital.  Does not follow commands. PSYCH: Calm. Normal affect.   Sch Meds:  Scheduled Meds:  collagenase   Topical Daily   furosemide   40 mg Intravenous BID   Gerhardt's butt cream   Topical TID   hydrocerin   Topical BID   pantoprazole  (PROTONIX ) IV  40 mg Intravenous Q12H   Continuous Infusions: PRN Meds:.acetaminophen  **OR** acetaminophen , antiseptic oral rinse, artificial tears, glycopyrrolate  **OR** glycopyrrolate  **OR** glycopyrrolate , haloperidol **OR** haloperidol **OR** haloperidol lactate, LORazepam  **OR** LORazepam  **OR** LORazepam , morphine  injection, ondansetron  **OR** ondansetron  (ZOFRAN ) IV, mouth rinse  Antimicrobials: Anti-infectives (From admission, onward)    Start      Dose/Rate Route Frequency Ordered Stop   10/21/23 1800  ceFEPIme (MAXIPIME) 2 g in sodium chloride  0.9 % 100 mL IVPB  Status:  Discontinued        2 g 200 mL/hr over 30 Minutes Intravenous Every 8 hours 10/21/23 1626 10/25/23 1053   10/20/23 1815  metroNIDAZOLE (FLAGYL) IVPB 500 mg  Status:  Discontinued        500 mg 100 mL/hr over 60 Minutes Intravenous Every 12 hours 10/20/23 1808 10/21/23 1044   10/20/23 0600  ceFAZolin  (ANCEF ) IVPB 2g/100 mL premix  Status:  Discontinued        2 g 200 mL/hr over 30 Minutes Intravenous Every 8 hours 10/20/23 0040 10/21/23 1626   10/20/23 0045  ceFAZolin  (ANCEF ) IVPB  Status:  Discontinued       Note to Pharmacy: Indication:  MSSA bacteremia First Dose: Yes Last Day of Therapy:  11/12/23 Labs - Once weekly:  CBC/D and CMP Pull PICC line at the completion of IV antibiotic therapy Method of administration: IV Push or per SNF protocol Method of administration may be changed at the discretion of home infusi   2 g Intravenous Every 8 hours 10/20/23 0030 10/20/23 0038        I have personally reviewed the following labs and images: CBC: Recent Labs  Lab 10/19/23 2144 10/20/23 0923 10/20/23 1916 10/21/23 0550 10/22/23 0250 10/23/23 0328 10/24/23 1005 10/25/23 0635  WBC 7.4   < > 8.2 4.9 5.2 4.5 4.7 4.5  NEUTROABS 6.1  --  7.4  --   --   --   --   --  HGB 11.3*   < > 12.1* 11.7* 10.1* 10.8* 12.6* 12.5*  HCT 33.6*   < > 35.2* 32.9* 29.0* 32.4* 38.3* 38.0*  MCV 92.3   < > 91.7 88.9 91.2 93.9 96.0 95.7  PLT 38*   < > 31* 23* 29* 26* 21* 17*   < > = values in this interval not displayed.   BMP &GFR Recent Labs  Lab 10/19/23 2144 10/20/23 1044 10/21/23 0550 10/22/23 0250 10/23/23 0328 10/24/23 1005 10/25/23 0834  NA 131*   < > 134* 136 137 142 141  K 4.2   < > 4.3 4.1 3.3* 4.5 4.2  CL 100   < > 105 107 110 112* 112*  CO2 19*   < > 16* 17* 18* 19* 17*  GLUCOSE 93   < > 88 105* 175* 157* 127*  BUN 31*   < > 33* 35* 40* 40* 44*   CREATININE 1.89*   < > 1.88* 1.97* 1.73* 1.45* 1.27*  CALCIUM 9.0   < > 8.8* 8.4* 8.2* 8.4* 8.6*  MG 2.1  --   --  1.8 2.0 1.9 1.8  PHOS  --   --   --  3.3 2.9 2.5 2.6   < > = values in this interval not displayed.   Estimated Creatinine Clearance: 98.8 mL/min (A) (by C-G formula based on SCr of 1.27 mg/dL (H)). Liver & Pancreas: Recent Labs  Lab 10/21/23 0550 10/22/23 0250 10/23/23 0328 10/24/23 1005 10/25/23 0834  AST 56* 48* 53* 91* 96*  ALT 5 <5 5 9 10   ALKPHOS 139* 136* 150* 172* 174*  BILITOT 4.5* 4.0* 3.3* 3.4* 3.7*  PROT 5.9* 5.3* 5.7* 6.0* 6.0*  ALBUMIN  2.0* 1.8* 1.9* 1.8* 1.8*   No results for input(s): LIPASE, AMYLASE in the last 168 hours. Recent Labs  Lab 10/23/23 0616 10/23/23 1015 10/23/23 1405 10/24/23 2124 10/25/23 0635  AMMONIA 28 35 43* 42* 41*   Diabetic: Recent Labs    10/23/23 0328  HGBA1C 4.7*   Recent Labs  Lab 10/24/23 1610 10/24/23 2022 10/25/23 0028 10/25/23 0413 10/25/23 0921  GLUCAP 102* 136* 133* 117* 103*   Cardiac Enzymes: No results for input(s): CKTOTAL, CKMB, CKMBINDEX, TROPONINI in the last 168 hours. No results for input(s): PROBNP in the last 8760 hours. Coagulation Profile: Recent Labs  Lab 10/19/23 2144 10/20/23 1916 10/21/23 0550 10/21/23 0858  INR 2.3* 2.3* 2.6* 2.5*   Thyroid  Function Tests: No results for input(s): TSH, T4TOTAL, FREET4, T3FREE, THYROIDAB in the last 72 hours. Lipid Profile: No results for input(s): CHOL, HDL, LDLCALC, TRIG, CHOLHDL, LDLDIRECT in the last 72 hours. Anemia Panel: No results for input(s): VITAMINB12, FOLATE, FERRITIN, TIBC, IRON , RETICCTPCT in the last 72 hours. Urine analysis:    Component Value Date/Time   COLORURINE YELLOW 09/26/2023 0844   APPEARANCEUR CLEAR 09/26/2023 0844   APPEARANCEUR Clear 09/29/2018 1020   LABSPEC 1.010 09/26/2023 0844   PHURINE 6.0 09/26/2023 0844   GLUCOSEU NEGATIVE 09/26/2023 0844   HGBUR  NEGATIVE 09/26/2023 0844   BILIRUBINUR NEGATIVE 09/26/2023 0844   BILIRUBINUR Negative 09/29/2018 1020   KETONESUR NEGATIVE 09/26/2023 0844   PROTEINUR NEGATIVE 09/26/2023 0844   NITRITE NEGATIVE 09/26/2023 0844   LEUKOCYTESUR NEGATIVE 09/26/2023 0844   Sepsis Labs: Invalid input(s): PROCALCITONIN, LACTICIDVEN  Microbiology: Recent Results (from the past 240 hours)  MRSA Next Gen by PCR, Nasal     Status: Abnormal   Collection Time: 10/20/23  8:59 PM   Specimen: Nasal Mucosa; Nasal Swab  Result Value Ref Range Status   MRSA by PCR Next Gen DETECTED (A) NOT DETECTED Final    Comment: RESULT CALLED TO, READ BACK BY AND VERIFIED WITH: CHRISTELLA PRINGLE RN 10/20/2023 @ 2258 BY AB (NOTE) The GeneXpert MRSA Assay (FDA approved for NASAL specimens only), is one component of a comprehensive MRSA colonization surveillance program. It is not intended to diagnose MRSA infection nor to guide or monitor treatment for MRSA infections. Test performance is not FDA approved in patients less than 39 years old. Performed at [facility], [address] [place], [place] [address]   Culture, blood (Routine X 2) w Reflex to ID Panel     Status: Abnormal   Collection Time: 10/20/23  9:33 PM   Specimen: BLOOD LEFT HAND  Result Value Ref Range Status   Specimen Description BLOOD LEFT HAND  Final   Special Requests   Final    BOTTLES DRAWN AEROBIC AND ANAEROBIC Blood Culture adequate volume   Culture  Setup Time   Final    GRAM NEGATIVE RODS IN BOTH AEROBIC AND ANAEROBIC BOTTLES CRITICAL RESULT CALLED TO, READ BACK BY AND VERIFIED WITH: MAYA CHARLENA SLADE [id number] @ 1610 FH Performed at [facility], [address] [place], [place] [address]    Culture PSEUDOMONAS AERUGINOSA (A)  Final   Report Status 10/24/2023 FINAL  Final   Organism ID, Bacteria PSEUDOMONAS AERUGINOSA  Final      Susceptibility   Pseudomonas aeruginosa - MIC*    MEROPENEM 4 INTERMEDIATE Intermediate      CIPROFLOXACIN  0.12 SENSITIVE Sensitive     PIP/TAZO Value in next row Sensitive      8 SENSITIVEThis is a modified FDA-approved test that has been validated and its performance characteristics determined by the reporting laboratory.  This laboratory is certified under the Clinical Laboratory Improvement Amendments CLIA as qualified to perform high complexity clinical laboratory testing.    CEFEPIME Value in next row Sensitive      8 SENSITIVEThis is a modified FDA-approved test that has been validated and its performance characteristics determined by the reporting laboratory.  This laboratory is certified under the Clinical Laboratory Improvement Amendments CLIA as qualified to perform high complexity clinical laboratory testing.    CEFTAZIDIME Value in next row Sensitive      8 SENSITIVEThis is a modified FDA-approved test that has been validated and its performance characteristics determined by the reporting laboratory.  This laboratory is certified under the Clinical Laboratory Improvement Amendments CLIA as qualified to perform high complexity clinical laboratory testing.    IMIPENEM Value in next row Sensitive      8 SENSITIVEThis is a modified FDA-approved test that has been validated and its performance characteristics determined by the reporting laboratory.  This laboratory is certified under the Clinical Laboratory Improvement Amendments CLIA as qualified to perform high complexity clinical laboratory testing.    CEFTAZIDIME/AVIBACTAM Value in next row Sensitive      8 SENSITIVEThis is a modified FDA-approved test that has been validated and its performance characteristics determined by the reporting laboratory.  This laboratory is certified under the Clinical Laboratory Improvement Amendments CLIA as qualified to perform high complexity clinical laboratory testing.    CEFTOLOZANE/TAZOBACTAM Value in next row Sensitive      8 SENSITIVEThis is a modified FDA-approved test that has been validated  and its performance characteristics determined by the reporting laboratory.  This laboratory is certified under the Clinical Laboratory Improvement Amendments CLIA as qualified to perform  high complexity clinical laboratory testing.    TOBRAMYCIN Value in next row Sensitive      8 SENSITIVEThis is a modified FDA-approved test that has been validated and its performance characteristics determined by the reporting laboratory.  This laboratory is certified under the Clinical Laboratory Improvement Amendments CLIA as qualified to perform high complexity clinical laboratory testing.    * PSEUDOMONAS AERUGINOSA  Blood Culture ID Panel (Reflexed)     Status: Abnormal   Collection Time: 10/20/23  9:33 PM  Result Value Ref Range Status   Enterococcus faecalis NOT DETECTED NOT DETECTED Final   Enterococcus Faecium NOT DETECTED NOT DETECTED Final   Listeria monocytogenes NOT DETECTED NOT DETECTED Final   Staphylococcus species NOT DETECTED NOT DETECTED Final   Staphylococcus aureus (BCID) NOT DETECTED NOT DETECTED Final   Staphylococcus epidermidis NOT DETECTED NOT DETECTED Final   Staphylococcus lugdunensis NOT DETECTED NOT DETECTED Final   Streptococcus species NOT DETECTED NOT DETECTED Final   Streptococcus agalactiae NOT DETECTED NOT DETECTED Final   Streptococcus pneumoniae NOT DETECTED NOT DETECTED Final   Streptococcus pyogenes NOT DETECTED NOT DETECTED Final   A.calcoaceticus-baumannii NOT DETECTED NOT DETECTED Final   Bacteroides fragilis NOT DETECTED NOT DETECTED Final   Enterobacterales NOT DETECTED NOT DETECTED Final   Enterobacter cloacae complex NOT DETECTED NOT DETECTED Final   Escherichia coli NOT DETECTED NOT DETECTED Final   Klebsiella aerogenes NOT DETECTED NOT DETECTED Final   Klebsiella oxytoca NOT DETECTED NOT DETECTED Final   Klebsiella pneumoniae NOT DETECTED NOT DETECTED Final   Proteus species NOT DETECTED NOT DETECTED Final   Salmonella species NOT DETECTED NOT DETECTED  Final   Serratia marcescens NOT DETECTED NOT DETECTED Final   Haemophilus influenzae NOT DETECTED NOT DETECTED Final   Neisseria meningitidis NOT DETECTED NOT DETECTED Final   Pseudomonas aeruginosa DETECTED (A) NOT DETECTED Final    Comment: CRITICAL RESULT CALLED TO, READ BACK BY AND VERIFIED WITH: PHARMD ESABRA SLADE [id number] @ 1610 FH    Stenotrophomonas maltophilia NOT DETECTED NOT DETECTED Final   Candida albicans NOT DETECTED NOT DETECTED Final   Candida auris NOT DETECTED NOT DETECTED Final   Candida glabrata NOT DETECTED NOT DETECTED Final   Candida krusei NOT DETECTED NOT DETECTED Final   Candida parapsilosis NOT DETECTED NOT DETECTED Final   Candida tropicalis NOT DETECTED NOT DETECTED Final   Cryptococcus neoformans/gattii NOT DETECTED NOT DETECTED Final   CTX-M ESBL NOT DETECTED NOT DETECTED Final   Carbapenem resistance IMP NOT DETECTED NOT DETECTED Final   Carbapenem resistance KPC NOT DETECTED NOT DETECTED Final   Carbapenem resistance NDM NOT DETECTED NOT DETECTED Final   Carbapenem resistance VIM NOT DETECTED NOT DETECTED Final    Comment: Performed at [facility], [address], [place], [place] [address]  Culture, blood (Routine X 2) w Reflex to ID Panel     Status: Abnormal   Collection Time: 10/20/23  9:37 PM   Specimen: BLOOD LEFT HAND  Result Value Ref Range Status   Specimen Description BLOOD LEFT HAND  Final   Special Requests   Final    BOTTLES DRAWN AEROBIC AND ANAEROBIC Blood Culture adequate volume   Culture  Setup Time   Final    GRAM NEGATIVE RODS IN BOTH AEROBIC AND ANAEROBIC BOTTLES    Culture (A)  Final    PSEUDOMONAS AERUGINOSA SUSCEPTIBILITIES PERFORMED ON PREVIOUS CULTURE WITHIN THE LAST 5 DAYS. Performed at [facility], [address] [place], [place] [address]  Report Status 10/24/2023 FINAL  Final    Radiology Studies: No results found.    Tyneisha Hegeman T. Patsye Sullivant [facility]  If 7PM-7AM, please contact  night-coverage [email or web address] 10/25/2023, 11:38 AM

## 2023-10-26 DIAGNOSIS — Z66 Do not resuscitate: Secondary | ICD-10-CM | POA: Diagnosis not present

## 2023-10-26 DIAGNOSIS — K7682 Hepatic encephalopathy: Secondary | ICD-10-CM | POA: Diagnosis not present

## 2023-10-26 DIAGNOSIS — I330 Acute and subacute infective endocarditis: Secondary | ICD-10-CM | POA: Diagnosis not present

## 2023-10-26 DIAGNOSIS — K7030 Alcoholic cirrhosis of liver without ascites: Secondary | ICD-10-CM | POA: Diagnosis not present

## 2023-10-26 DIAGNOSIS — Z515 Encounter for palliative care: Secondary | ICD-10-CM | POA: Diagnosis not present

## 2023-10-26 MED ORDER — HYDROMORPHONE HCL 1 MG/ML IJ SOLN
0.5000 mg | INTRAMUSCULAR | Status: DC
  Administered 2023-10-26 – 2023-10-27 (×5): 0.5 mg via INTRAVENOUS
  Filled 2023-10-26 (×5): qty 0.5

## 2023-10-26 MED ORDER — HYDROMORPHONE HCL 1 MG/ML IJ SOLN: 0.5000 mg | INTRAMUSCULAR | Status: DC | PRN

## 2023-10-26 NOTE — Progress Notes (Signed)
 PROGRESS NOTE  [name] FMW:[id number] DOB: 1965/12/16   PCP: Carleen Dallas PARAS, FNP (Inactive)  Patient is from: SNF  DOA: 10/19/2023 LOS: 6  Chief complaints Chief Complaint  Patient presents with   Altered Mental Status     Brief Narrative / Interim history: 58 year old M with PMH of cirrhosis with mesenteric/portal vein thrombosis, esophageal varices, thrombocytopenia, hepatic encephalopathy, HFpEF, A-fib not on AC, adrenal insufficiency, hypertension MSSA bacteremia with presumed infective endocarditis on IV Ancef  and recent hospitalization from 10/2-10/8 at [facility] with acute hepatic encephalopathy and AKI return to [facility] with increased confusion and right gaze preference.  Ammonia level was elevated to 247.  VBG without significant finding.  Teleneurology consulted.  There was concern about seizure/CVA but patient was not a candidate for IV thrombolytics given esophageal varices and thrombocytopenia.  CT head and CT angio head and neck without acute finding but concern for bilateral bronchopneumonia.  He was loaded with Keppra and transferred to [facility] for neuro workup and EEG monitoring.  Further workup revealed Pseudomonas bacteremia.  Antibiotic escalated to IV cefepime after discussion with ID.   Patient has had worsening encephalopathy despite improvement in his ammonia level.  LTM was reviewed by neurology concerned, originally showing diffuse slowing but around noon patient developed burst suppression pattern but on no burst suppression therapy.  Neurology recommended stat CT of head due to severe low platelet count of 29,000.  PCCM was consulted and he was moved to ICU on 10/12.  Repeat CT head without acute finding.  MRI brain on 10/15 without acute finding.  Keppra discontinued.  Patient remained encephalopathic.  Palliative medicine involved.  Patient was transferred to hospitalist service on 10/16.  He was transition to full comfort care on  10/17 per discussion and lung palliative medicine, DSS (legal guardian) and stepson.  Anticipate in-hospital death  Subjective: Seen and examined earlier this morning.  No major events overnight or this morning.  Awake but confused.  Stepson and cousin at bedside.  They think he is in pain.   Assessment and plan: End-of-life care/full comfort care-initiated on 10/17 by palliative medicine after discussion with DSS and stepson.  Seems uncomfortable. - Appreciate help by palliative medicine - Discontinued as needed morphine  and started scheduled and as needed Dilaudid  - Anticipate in hospital days.  Decompensated liver cirrhosis Acute hepatic encephalopathy Acute metabolic encephalopathy Anion GAP Metabolic Acidosis  Hyperammonemia, improving History of esophageal varices Anemia of critical illness Acute on chronic thrombocytopenia Anasarca   Paroxysmal atrial fibrillation   AKI/acute urinary retention - Continue foley for comfort care.   Sepsis due to pseudomonas bacteremia   Infective endocarditis Chronic diastolic heart failure   Hypothyroidism   Adrenal insufficiency    DVT prophylaxis-full comfort care.   Nutrition/inadequate oral intake Body mass index is 49.6 kg/m. Nutrition Problem: Inadequate oral intake Etiology: inability to eat Signs/Symptoms: NPO status Interventions: Tube feeding  Pressure skin injury: Present on arrival. Wound 09/27/23 1212 Pressure Injury Sacrum Stage 1 -  Intact skin with non-blanchable redness of a localized area usually over a bony prominence. (Active)     Wound 10/10/23 2235 Pressure Injury Heel Right Unstageable - Full thickness tissue loss in which the base of the injury is covered by slough (yellow, tan, gray, green or brown) and/or eschar (tan, brown or black) in the wound bed. (Active)     Wound 10/20/23 Pressure Injury Buttocks Right;Left Stage 3 -  Full thickness tissue loss. Subcutaneous fat may be visible but  bone, tendon  or muscle are NOT exposed. (Active)    Code Status: DNR-comfort Family Communication: Updated stepson and cousin at bedside Level of care: Med-Surg Status is: Inpatient Remains inpatient appropriate because: End-of-life care   Final disposition: Anticipated in hospital death   35 minutes with more than 50% spent in reviewing records, counseling patient/family and coordinating care.  Consultants:  Neurology Infectious disease Palliative medicine  Procedures: None  Microbiology summarized: 10/12-blood culture with Pseudomonas aeruginosa  Objective: Vitals:   10/25/23 0900 10/25/23 1446 10/25/23 1905 10/26/23 0739  BP: (!) 124/92 128/80 123/85 136/88  Pulse: 60 (!) 108 100 (!) 115  Resp: 20 16 16 16   Temp: 97.7 F (36.5 C) 98.7 F (37.1 C) (!) 97.3 F (36.3 C) 97.6 F (36.4 C)  TempSrc: Oral Oral Oral Oral  SpO2: 99% 100% 99% 93%  Weight:      Height:        Examination:  GENERAL: No apparent distress.  HEENT: MMM.  Vision and hearing grossly intact.  NECK: Supple.  No apparent JVD.  RESP:  No IWOB.  Fair aeration bilaterally. CVS:  RRR. Heart sounds normal.  ABD/GI/GU: BS+. Abd soft, NTND.  Scrotal edema. MSK/EXT:  Moves  extremities.  Edema/anasarca. SKIN: no apparent skin lesion or wound NEURO: Somnolent.  Wakes to voice.  Oriented to self and hospital.  Does not follow commands. PSYCH: Calm. Normal affect.   Sch Meds:  Scheduled Meds:  collagenase   Topical Daily   furosemide   40 mg Intravenous BID   Gerhardt's butt cream   Topical TID   hydrocerin   Topical BID    HYDROmorphone  (DILAUDID ) injection  0.5 mg Intravenous Q2H   pantoprazole  (PROTONIX ) IV  40 mg Intravenous Q12H   Continuous Infusions: PRN Meds:.acetaminophen  **OR** acetaminophen , antiseptic oral rinse, artificial tears, glycopyrrolate  **OR** glycopyrrolate  **OR** glycopyrrolate , haloperidol **OR** haloperidol **OR** haloperidol lactate, HYDROmorphone  (DILAUDID ) injection, LORazepam   **OR** LORazepam  **OR** LORazepam , ondansetron  **OR** ondansetron  (ZOFRAN ) IV, mouth rinse  Antimicrobials: Anti-infectives (From admission, onward)    Start     Dose/Rate Route Frequency Ordered Stop   10/21/23 1800  ceFEPIme (MAXIPIME) 2 g in sodium chloride  0.9 % 100 mL IVPB  Status:  Discontinued        2 g 200 mL/hr over 30 Minutes Intravenous Every 8 hours 10/21/23 1626 10/25/23 1053   10/20/23 1815  metroNIDAZOLE (FLAGYL) IVPB 500 mg  Status:  Discontinued        500 mg 100 mL/hr over 60 Minutes Intravenous Every 12 hours 10/20/23 1808 10/21/23 1044   10/20/23 0600  ceFAZolin  (ANCEF ) IVPB 2g/100 mL premix  Status:  Discontinued        2 g 200 mL/hr over 30 Minutes Intravenous Every 8 hours 10/20/23 0040 10/21/23 1626   10/20/23 0045  ceFAZolin  (ANCEF ) IVPB  Status:  Discontinued       Note to Pharmacy: Indication:  MSSA bacteremia First Dose: Yes Last Day of Therapy:  11/12/23 Labs - Once weekly:  CBC/D and CMP Pull PICC line at the completion of IV antibiotic therapy Method of administration: IV Push or per SNF protocol Method of administration may be changed at the discretion of home infusi   2 g Intravenous Every 8 hours 10/20/23 0030 10/20/23 0038        I have personally reviewed the following labs and images: CBC: Recent Labs  Lab 10/19/23 2144 10/20/23 0923 10/20/23 1916 10/21/23 0550 10/22/23 0250 10/23/23 0328 10/24/23 1005 10/25/23 0635  WBC 7.4   < >  8.2 4.9 5.2 4.5 4.7 4.5  NEUTROABS 6.1  --  7.4  --   --   --   --   --   HGB 11.3*   < > 12.1* 11.7* 10.1* 10.8* 12.6* 12.5*  HCT 33.6*   < > 35.2* 32.9* 29.0* 32.4* 38.3* 38.0*  MCV 92.3   < > 91.7 88.9 91.2 93.9 96.0 95.7  PLT 38*   < > 31* 23* 29* 26* 21* 17*   < > = values in this interval not displayed.   BMP &GFR Recent Labs  Lab 10/19/23 2144 10/20/23 1044 10/21/23 0550 10/22/23 0250 10/23/23 0328 10/24/23 1005 10/25/23 0834  NA 131*   < > 134* 136 137 142 141  K 4.2   < > 4.3 4.1 3.3*  4.5 4.2  CL 100   < > 105 107 110 112* 112*  CO2 19*   < > 16* 17* 18* 19* 17*  GLUCOSE 93   < > 88 105* 175* 157* 127*  BUN 31*   < > 33* 35* 40* 40* 44*  CREATININE 1.89*   < > 1.88* 1.97* 1.73* 1.45* 1.27*  CALCIUM 9.0   < > 8.8* 8.4* 8.2* 8.4* 8.6*  MG 2.1  --   --  1.8 2.0 1.9 1.8  PHOS  --   --   --  3.3 2.9 2.5 2.6   < > = values in this interval not displayed.   Estimated Creatinine Clearance: 98.8 mL/min (A) (by C-G formula based on SCr of 1.27 mg/dL (H)). Liver & Pancreas: Recent Labs  Lab 10/21/23 0550 10/22/23 0250 10/23/23 0328 10/24/23 1005 10/25/23 0834  AST 56* 48* 53* 91* 96*  ALT 5 <5 5 9 10   ALKPHOS 139* 136* 150* 172* 174*  BILITOT 4.5* 4.0* 3.3* 3.4* 3.7*  PROT 5.9* 5.3* 5.7* 6.0* 6.0*  ALBUMIN  2.0* 1.8* 1.9* 1.8* 1.8*   No results for input(s): LIPASE, AMYLASE in the last 168 hours. Recent Labs  Lab 10/23/23 0616 10/23/23 1015 10/23/23 1405 10/24/23 2124 10/25/23 0635  AMMONIA 28 35 43* 42* 41*   Diabetic: No results for input(s): HGBA1C in the last 72 hours.  Recent Labs  Lab 10/24/23 2022 10/25/23 0028 10/25/23 0413 10/25/23 0921 10/25/23 1137  GLUCAP 136* 133* 117* 103* 106*   Cardiac Enzymes: No results for input(s): CKTOTAL, CKMB, CKMBINDEX, TROPONINI in the last 168 hours. No results for input(s): PROBNP in the last 8760 hours. Coagulation Profile: Recent Labs  Lab 10/19/23 2144 10/20/23 1916 10/21/23 0550 10/21/23 0858  INR 2.3* 2.3* 2.6* 2.5*   Thyroid  Function Tests: No results for input(s): TSH, T4TOTAL, FREET4, T3FREE, THYROIDAB in the last 72 hours. Lipid Profile: No results for input(s): CHOL, HDL, LDLCALC, TRIG, CHOLHDL, LDLDIRECT in the last 72 hours. Anemia Panel: No results for input(s): VITAMINB12, FOLATE, FERRITIN, TIBC, IRON , RETICCTPCT in the last 72 hours. Urine analysis:    Component Value Date/Time   COLORURINE YELLOW 09/26/2023 0844   APPEARANCEUR  CLEAR 09/26/2023 0844   APPEARANCEUR Clear 09/29/2018 1020   LABSPEC 1.010 09/26/2023 0844   PHURINE 6.0 09/26/2023 0844   GLUCOSEU NEGATIVE 09/26/2023 0844   HGBUR NEGATIVE 09/26/2023 0844   BILIRUBINUR NEGATIVE 09/26/2023 0844   BILIRUBINUR Negative 09/29/2018 1020   KETONESUR NEGATIVE 09/26/2023 0844   PROTEINUR NEGATIVE 09/26/2023 0844   NITRITE NEGATIVE 09/26/2023 0844   LEUKOCYTESUR NEGATIVE 09/26/2023 0844   Sepsis Labs: Invalid input(s): PROCALCITONIN, LACTICIDVEN  Microbiology: Recent Results (from the past 240 hours)  MRSA Next Gen by PCR, Nasal     Status: Abnormal   Collection Time: 10/20/23  8:59 PM   Specimen: Nasal Mucosa; Nasal Swab  Result Value Ref Range Status   MRSA by PCR Next Gen DETECTED (A) NOT DETECTED Final    Comment: RESULT CALLED TO, READ BACK BY AND VERIFIED WITH: CHRISTELLA PRINGLE RN 10/20/2023 @ 2258 BY AB (NOTE) The GeneXpert MRSA Assay (FDA approved for NASAL specimens only), is one component of a comprehensive MRSA colonization surveillance program. It is not intended to diagnose MRSA infection nor to guide or monitor treatment for MRSA infections. Test performance is not FDA approved in patients less than 55 years old. Performed at [facility], [address] [place], [place] [address]   Culture, blood (Routine X 2) w Reflex to ID Panel     Status: Abnormal   Collection Time: 10/20/23  9:33 PM   Specimen: BLOOD LEFT HAND  Result Value Ref Range Status   Specimen Description BLOOD LEFT HAND  Final   Special Requests   Final    BOTTLES DRAWN AEROBIC AND ANAEROBIC Blood Culture adequate volume   Culture  Setup Time   Final    GRAM NEGATIVE RODS IN BOTH AEROBIC AND ANAEROBIC BOTTLES CRITICAL RESULT CALLED TO, READ BACK BY AND VERIFIED WITH: MAYA CHARLENA SLADE [id number] @ 1610 FH Performed at [facility], [address] [place], [place] [address]    Culture PSEUDOMONAS AERUGINOSA (A)  Final   Report Status 10/24/2023 FINAL   Final   Organism ID, Bacteria PSEUDOMONAS AERUGINOSA  Final      Susceptibility   Pseudomonas aeruginosa - MIC*    MEROPENEM 4 INTERMEDIATE Intermediate     CIPROFLOXACIN  0.12 SENSITIVE Sensitive     PIP/TAZO Value in next row Sensitive      8 SENSITIVEThis is a modified FDA-approved test that has been validated and its performance characteristics determined by the reporting laboratory.  This laboratory is certified under the Clinical Laboratory Improvement Amendments CLIA as qualified to perform high complexity clinical laboratory testing.    CEFEPIME Value in next row Sensitive      8 SENSITIVEThis is a modified FDA-approved test that has been validated and its performance characteristics determined by the reporting laboratory.  This laboratory is certified under the Clinical Laboratory Improvement Amendments CLIA as qualified to perform high complexity clinical laboratory testing.    CEFTAZIDIME Value in next row Sensitive      8 SENSITIVEThis is a modified FDA-approved test that has been validated and its performance characteristics determined by the reporting laboratory.  This laboratory is certified under the Clinical Laboratory Improvement Amendments CLIA as qualified to perform high complexity clinical laboratory testing.    IMIPENEM Value in next row Sensitive      8 SENSITIVEThis is a modified FDA-approved test that has been validated and its performance characteristics determined by the reporting laboratory.  This laboratory is certified under the Clinical Laboratory Improvement Amendments CLIA as qualified to perform high complexity clinical laboratory testing.    CEFTAZIDIME/AVIBACTAM Value in next row Sensitive      8 SENSITIVEThis is a modified FDA-approved test that has been validated and its performance characteristics determined by the reporting laboratory.  This laboratory is certified under the Clinical Laboratory Improvement Amendments CLIA as qualified to perform high complexity  clinical laboratory testing.    CEFTOLOZANE/TAZOBACTAM Value in next row Sensitive      8 SENSITIVEThis is a modified FDA-approved test that  has been validated and its performance characteristics determined by the reporting laboratory.  This laboratory is certified under the Clinical Laboratory Improvement Amendments CLIA as qualified to perform high complexity clinical laboratory testing.    TOBRAMYCIN Value in next row Sensitive      8 SENSITIVEThis is a modified FDA-approved test that has been validated and its performance characteristics determined by the reporting laboratory.  This laboratory is certified under the Clinical Laboratory Improvement Amendments CLIA as qualified to perform high complexity clinical laboratory testing.    * PSEUDOMONAS AERUGINOSA  Blood Culture ID Panel (Reflexed)     Status: Abnormal   Collection Time: 10/20/23  9:33 PM  Result Value Ref Range Status   Enterococcus faecalis NOT DETECTED NOT DETECTED Final   Enterococcus Faecium NOT DETECTED NOT DETECTED Final   Listeria monocytogenes NOT DETECTED NOT DETECTED Final   Staphylococcus species NOT DETECTED NOT DETECTED Final   Staphylococcus aureus (BCID) NOT DETECTED NOT DETECTED Final   Staphylococcus epidermidis NOT DETECTED NOT DETECTED Final   Staphylococcus lugdunensis NOT DETECTED NOT DETECTED Final   Streptococcus species NOT DETECTED NOT DETECTED Final   Streptococcus agalactiae NOT DETECTED NOT DETECTED Final   Streptococcus pneumoniae NOT DETECTED NOT DETECTED Final   Streptococcus pyogenes NOT DETECTED NOT DETECTED Final   A.calcoaceticus-baumannii NOT DETECTED NOT DETECTED Final   Bacteroides fragilis NOT DETECTED NOT DETECTED Final   Enterobacterales NOT DETECTED NOT DETECTED Final   Enterobacter cloacae complex NOT DETECTED NOT DETECTED Final   Escherichia coli NOT DETECTED NOT DETECTED Final   Klebsiella aerogenes NOT DETECTED NOT DETECTED Final   Klebsiella oxytoca NOT DETECTED NOT DETECTED  Final   Klebsiella pneumoniae NOT DETECTED NOT DETECTED Final   Proteus species NOT DETECTED NOT DETECTED Final   Salmonella species NOT DETECTED NOT DETECTED Final   Serratia marcescens NOT DETECTED NOT DETECTED Final   Haemophilus influenzae NOT DETECTED NOT DETECTED Final   Neisseria meningitidis NOT DETECTED NOT DETECTED Final   Pseudomonas aeruginosa DETECTED (A) NOT DETECTED Final    Comment: CRITICAL RESULT CALLED TO, READ BACK BY AND VERIFIED WITH: PHARMD ESABRA SLADE [id number] @ 1610 FH    Stenotrophomonas maltophilia NOT DETECTED NOT DETECTED Final   Candida albicans NOT DETECTED NOT DETECTED Final   Candida auris NOT DETECTED NOT DETECTED Final   Candida glabrata NOT DETECTED NOT DETECTED Final   Candida krusei NOT DETECTED NOT DETECTED Final   Candida parapsilosis NOT DETECTED NOT DETECTED Final   Candida tropicalis NOT DETECTED NOT DETECTED Final   Cryptococcus neoformans/gattii NOT DETECTED NOT DETECTED Final   CTX-M ESBL NOT DETECTED NOT DETECTED Final   Carbapenem resistance IMP NOT DETECTED NOT DETECTED Final   Carbapenem resistance KPC NOT DETECTED NOT DETECTED Final   Carbapenem resistance NDM NOT DETECTED NOT DETECTED Final   Carbapenem resistance VIM NOT DETECTED NOT DETECTED Final    Comment: Performed at [facility], [address] [place], [place] [address]  Culture, blood (Routine X 2) w Reflex to ID Panel     Status: Abnormal   Collection Time: 10/20/23  9:37 PM   Specimen: BLOOD LEFT HAND  Result Value Ref Range Status   Specimen Description BLOOD LEFT HAND  Final   Special Requests   Final    BOTTLES DRAWN AEROBIC AND ANAEROBIC Blood Culture adequate volume   Culture  Setup Time   Final    GRAM NEGATIVE RODS IN BOTH AEROBIC AND ANAEROBIC BOTTLES    Culture (A)  Final  PSEUDOMONAS AERUGINOSA SUSCEPTIBILITIES PERFORMED ON PREVIOUS CULTURE WITHIN THE LAST 5 DAYS. Performed at [facility], [address] [place], [place] [address]     Report Status 10/24/2023 FINAL  Final    Radiology Studies: No results found.    Lijah Bourque T. Kyros Salzwedel [facility]  If 7PM-7AM, please contact night-coverage [email or web address] 10/26/2023, 3:21 PM

## 2023-10-26 NOTE — Progress Notes (Signed)
 Daily Progress Note   Patient Name: [name]       Date: 10/26/2023 DOB: 05-19-1965  Age: 58 y.o. MRN#: [id number] Attending Physician: Kathrin Mignon DASEN, MD Primary Care Physician: Carleen Dallas PARAS, FNP (Inactive) Admit Date: 10/19/2023  Reason for Consultation/Follow-up: Terminal Care  Subjective: States he's hungry  Length of Stay: 6  Current Medications: Scheduled Meds:   collagenase   Topical Daily   furosemide   40 mg Intravenous BID   Gerhardt's butt cream   Topical TID   hydrocerin   Topical BID    HYDROmorphone  (DILAUDID ) injection  0.5 mg Intravenous Q2H   pantoprazole  (PROTONIX ) IV  40 mg Intravenous Q12H    Continuous Infusions:   PRN Meds: acetaminophen  **OR** acetaminophen , antiseptic oral rinse, artificial tears, glycopyrrolate  **OR** glycopyrrolate  **OR** glycopyrrolate , haloperidol **OR** haloperidol **OR** haloperidol lactate, HYDROmorphone  (DILAUDID ) injection, LORazepam  **OR** LORazepam  **OR** LORazepam , ondansetron  **OR** ondansetron  (ZOFRAN ) IV, mouth rinse  Physical Exam Constitutional:      General: He is not in acute distress.    Appearance: He is ill-appearing.  Pulmonary:     Effort: Pulmonary effort is normal.  Skin:    General: Skin is warm and dry.             Vital Signs: BP 136/88 (BP Location: Left Arm)   Pulse (!) 115   Temp 97.6 F (36.4 C) (Oral)   Resp 16   Ht 6' (1.829 m)   Wt (!) 165.9 kg   SpO2 93%   BMI 49.60 kg/m  SpO2: SpO2: 93 % O2 Device: O2 Device: Room Air O2 Flow Rate: O2 Flow Rate (L/min): 2 L/min  Intake/output summary:  Intake/Output Summary (Last 24 hours) at 10/26/2023 1238 Last data filed at 10/26/2023 9162 Gross per 24 hour  Intake 240 ml  Output 400 ml  Net -160 ml   LBM: Last BM Date : 10/25/23 Baseline  Weight: Weight: (!) 161 kg Most recent weight: Weight: (!) 165.9 kg       Palliative Assessment/Data: PPS 20%      Patient Active Problem List   Diagnosis Date Noted   Acute hepatic encephalopathy (HCC) 10/20/2023   Alcoholic cirrhosis (HCC) 10/20/2023   Coagulopathy 10/20/2023   Infective endocarditis 10/20/2023   Adrenal insufficiency 10/20/2023   Pressure injury of right ankle, stage 4 (HCC) 10/20/2023   Acute kidney injury 10/11/2023   Prolonged QT interval 10/11/2023   Hyperammonemia 10/11/2023   Acute endocarditis 10/11/2023   Hypothyroidism 10/11/2023   GERD (gastroesophageal reflux disease) 10/11/2023   AKI (acute kidney injury) 10/11/2023   Staphylococcus aureus bacteremia 10/01/2023   Gram-positive bacteremia 09/30/2023   Paroxysmal atrial fibrillation (HCC) 09/29/2023   Hepatic encephalopathy (HCC) 09/29/2023   Acute metabolic encephalopathy 09/25/2023   Hyponatremia 04/04/2021   Esophageal varices (HCC) 04/28/2020   History of colonic polyps 04/28/2020   Alcohol abuse 04/28/2020   Acute shoulder pain due to trauma, right 10/12/2019   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 10/12/2019   Localized osteoarthritis of wrist 10/12/2019   Nontraumatic complete tear of right rotator cuff 10/12/2019   Cocaine abuse (HCC) 09/09/2019   1st degree AV block 08/17/2019   Lyme disease 08/17/2019  Fever of unknown origin 08/16/2019   Elevated AST (SGOT) 08/04/2019   Dyslipidemia 07/14/2019   Iron  deficiency anemia 07/08/2019   Migraine without aura 05/18/2019   Acute on chronic diastolic CHF (congestive heart failure) (HCC) 05/14/2019   Scrotal edema 04/22/2019   Bilateral hydrocele 04/22/2019   Pancytopenia (HCC)    Long-term current use of opiate analgesic 03/21/2019   Seizure-like activity (HCC) 03/21/2019   Meralgia paresthetica of left side 03/21/2019   Polyarthropathy 03/21/2019   Arm mass, right 07/31/2017   Bilateral primary osteoarthritis  of knee 04/04/2017   Depression, recurrent 07/18/2016   Encounter for screening for other viral diseases 06/20/2016   Mixed hyperlipidemia 10/31/2015   Leukopenia 07/08/2015   Abdominal wall bulge 03/02/2015   Portal hypertension (HCC) 08/24/2014   Hypokalemia 08/24/2014   COPD, severity to be determined (HCC) 08/24/2014   Biliary colic 07/14/2014   CHF (congestive heart failure) (HCC) 07/14/2014   H/O ETOH abuse 07/14/2014   Thrombocytopenia 07/30/2013   Chronic pain disorder 07/30/2013   Edema 07/30/2013   Genu varus    Hypogonadism male 05/05/2012   Umbilical hernia without obstruction and without gangrene 12/26/2011   Asthma 08/28/2011   Cirrhosis (HCC) 08/14/2011   UNSPECIFIED TACHYCARDIA 02/01/2010   Gout 05/22/2008   Hypertension, essential 05/22/2008   Atrial fibrillation, chronic (HCC) 05/22/2008   DIASTOLIC HEART FAILURE, CHRONIC 05/22/2008   Acute on chronic diastolic heart failure (HCC) 05/22/2008   Morbid obesity with BMI of 50.0-59.9, adult (HCC) 02/21/2007   Coronary atherosclerosis 02/21/2007   Obstructive sleep apnea of adult 02/04/2007    Palliative Care Assessment & Plan   HPI: 58 y.o. male  with past medical history of EtOH cirrhosis, portal vein thrombosis, thrombocytopenia, esophageal varices with recent admission for hepatic encephalopathy, MSSA bacteremia with infective endocarditis on cefazolin  therapy, adrenal insufficiency who presented with altered mental status.  He was admitted on 10/19/2023 with altered mental status, hepatic encephalopathy, sepsis due to Pseudomonas bacteremia, history of MSSA endocarditis, adrenal insufficiency, AKI.    Palliative medicine was consulted for GOC conversations and attempts to facilitate decision making between medical team and emergency legal guardian.    Assessment: Appears slightly more interactive than on previous days.  Though he does remain confused. He tells me he feels hungry.  No other complaints.   Denies pain. Discussed with RN, will order soft diet.  Recommendations/Plan: Continue comfort measures only -see MAR for medication orders Per previous notes, family requested in hospital death PMT will follow  Care plan was discussed with RN  Thank you for allowing the Palliative Medicine Team to assist in the care of this patient.   Total Time 25 minutes Prolonged Time Billed  no   Time spent includes: Detailed review of medical records (labs, imaging, vital signs), medically appropriate exam, discussion with treatment team, counseling and educating patient, family and/or staff, documenting clinical information, medication management and coordination of care.     *Please note that this is a verbal dictation therefore any spelling or grammatical errors are due to the Dragon Medical One system interpretation.  Tobey Jama Barnacle, DNP, [facility] Palliative Medicine Team Team Phone # [phone]  Pager [phone]

## 2023-10-26 NOTE — Plan of Care (Signed)
   Problem: Health Behavior/Discharge Planning: Goal: Ability to manage health-related needs will improve Outcome: Progressing   Problem: Clinical Measurements: Goal: Will remain free from infection Outcome: Progressing

## 2023-10-27 DIAGNOSIS — K7682 Hepatic encephalopathy: Secondary | ICD-10-CM | POA: Diagnosis not present

## 2023-10-27 DIAGNOSIS — Z515 Encounter for palliative care: Secondary | ICD-10-CM | POA: Diagnosis not present

## 2023-10-27 DIAGNOSIS — Z66 Do not resuscitate: Secondary | ICD-10-CM | POA: Diagnosis not present

## 2023-10-27 MED ORDER — HYDROMORPHONE HCL 1 MG/ML IJ SOLN
1.0000 mg | INTRAMUSCULAR | Status: DC
  Administered 2023-10-27 – 2023-10-28 (×9): 1 mg via INTRAVENOUS
  Filled 2023-10-27 (×9): qty 1

## 2023-10-27 NOTE — Plan of Care (Signed)

## 2023-10-27 NOTE — Progress Notes (Addendum)
 PROGRESS NOTE  [name] FMW:[id number] DOB: 02/05/1965   PCP: Carleen Dallas PARAS, FNP (Inactive)  Patient is from: SNF  DOA: 10/19/2023 LOS: 7  Chief complaints Chief Complaint  Patient presents with   Altered Mental Status     Brief Narrative / Interim history: 58 year old M with PMH of cirrhosis with mesenteric/portal vein thrombosis, esophageal varices, thrombocytopenia, hepatic encephalopathy, HFpEF, A-fib not on AC, adrenal insufficiency, hypertension MSSA bacteremia with presumed infective endocarditis on IV Ancef  and recent hospitalization from 10/2-10/8 at [facility] with acute hepatic encephalopathy and AKI return to [facility] with increased confusion and right gaze preference.  Ammonia level was elevated to 247.  VBG without significant finding.  Teleneurology consulted.  There was concern about seizure/CVA but patient was not a candidate for IV thrombolytics given esophageal varices and thrombocytopenia.  CT head and CT angio head and neck without acute finding but concern for bilateral bronchopneumonia.  He was loaded with Keppra and transferred to [facility] for neuro workup and EEG monitoring.  Further workup revealed Pseudomonas bacteremia.  Antibiotic escalated to IV cefepime after discussion with ID.   Patient has had worsening encephalopathy despite improvement in his ammonia level.  LTM was reviewed by neurology concerned, originally showing diffuse slowing but around noon patient developed burst suppression pattern but on no burst suppression therapy.  Neurology recommended stat CT of head due to severe low platelet count of 29,000.  PCCM was consulted and he was moved to ICU on 10/12.  Repeat CT head without acute finding.  MRI brain on 10/15 without acute finding.  Keppra discontinued.  Patient remained encephalopathic.  Palliative medicine involved.  Patient was transferred to hospitalist service on 10/16.  He was transition to full comfort care on  10/17 per discussion and lung palliative medicine, DSS (legal guardian) and stepson.  Anticipate in-hospital death  Subjective: Seen and examined earlier this morning.  No major events overnight or this morning.  Appears comfortable.   Assessment and plan: End-of-life care/full comfort care-initiated on 10/17 by palliative medicine after discussion with DSS and stepson.  Seems uncomfortable. - Appreciate help by palliative medicine -Continue comfort meds. - Anticipate in hospital death  Decompensated liver cirrhosis Acute hepatic encephalopathy Acute metabolic encephalopathy Anion GAP Metabolic Acidosis  Hyperammonemia, improving History of esophageal varices Anemia of critical illness Acute on chronic thrombocytopenia Anasarca   Paroxysmal atrial fibrillation   AKI/acute urinary retention - Continue foley for comfort care.   Sepsis due to pseudomonas bacteremia   Infective endocarditis Chronic diastolic heart failure   Hypothyroidism   Adrenal insufficiency    DVT prophylaxis-full comfort care.   Nutrition/inadequate oral intake Body mass index is 49.6 kg/m. Nutrition Problem: Inadequate oral intake Etiology: inability to eat Signs/Symptoms: NPO status Interventions: Tube feeding  Pressure skin injury: Present on arrival. Wound 09/27/23 1212 Pressure Injury Sacrum Stage 1 -  Intact skin with non-blanchable redness of a localized area usually over a bony prominence. (Active)     Wound 10/10/23 2235 Pressure Injury Heel Right Unstageable - Full thickness tissue loss in which the base of the injury is covered by slough (yellow, tan, gray, green or brown) and/or eschar (tan, brown or black) in the wound bed. (Active)     Wound 10/20/23 Pressure Injury Buttocks Right;Left Stage 3 -  Full thickness tissue loss. Subcutaneous fat may be visible but bone, tendon or muscle are NOT exposed. (Active)    Code Status: DNR-comfort Family Communication: None at  bedside. Level of care:  Med-Surg Status is: Inpatient Remains inpatient appropriate because: End-of-life care   Final disposition: Anticipated in hospital death   35 minutes with more than 50% spent in reviewing records, counseling patient/family and coordinating care.  Consultants:  Neurology Infectious disease Palliative medicine  Procedures: None  Microbiology summarized: 10/12-blood culture with Pseudomonas aeruginosa  Objective: Vitals:   10/25/23 1446 10/25/23 1905 10/26/23 0739 10/27/23 0751  BP: 128/80 123/85 136/88 (!) 161/138  Pulse: (!) 108 100 (!) 115 80  Resp: 16 16 16 17   Temp: 98.7 F (37.1 C) (!) 97.3 F (36.3 C) 97.6 F (36.4 C) 98 F (36.7 C)  TempSrc: Oral Oral Oral Oral  SpO2: 100% 99% 93% 100%  Weight:      Height:        Examination:  GENERAL: No apparent distress.  HEENT: MMM.  Vision and hearing grossly intact.  NECK: Supple.  No apparent JVD.  RESP:  No IWOB.  Fair aeration bilaterally. CVS:  RRR. Heart sounds normal.  ABD/GI/GU: BS+. Abd soft, NTND.  Scrotal edema. MSK/EXT:  Moves  extremities.  Edema/anasarca. SKIN: no apparent skin lesion or wound NEURO: Somnolent.  Wakes to voice.  Oriented to self and hospital.  Does not follow commands. PSYCH: Calm. Normal affect.   Sch Meds:  Scheduled Meds:  collagenase   Topical Daily   Gerhardt's butt cream   Topical TID   hydrocerin   Topical BID    HYDROmorphone  (DILAUDID ) injection  1 mg Intravenous Q2H   Continuous Infusions: PRN Meds:.acetaminophen  **OR** acetaminophen , antiseptic oral rinse, artificial tears, glycopyrrolate  **OR** glycopyrrolate  **OR** glycopyrrolate , haloperidol **OR** haloperidol **OR** haloperidol lactate, HYDROmorphone  (DILAUDID ) injection, LORazepam  **OR** LORazepam  **OR** LORazepam , ondansetron  **OR** ondansetron  (ZOFRAN ) IV, mouth rinse  Antimicrobials: Anti-infectives (From admission, onward)    Start     Dose/Rate Route Frequency Ordered Stop    10/21/23 1800  ceFEPIme (MAXIPIME) 2 g in sodium chloride  0.9 % 100 mL IVPB  Status:  Discontinued        2 g 200 mL/hr over 30 Minutes Intravenous Every 8 hours 10/21/23 1626 10/25/23 1053   10/20/23 1815  metroNIDAZOLE (FLAGYL) IVPB 500 mg  Status:  Discontinued        500 mg 100 mL/hr over 60 Minutes Intravenous Every 12 hours 10/20/23 1808 10/21/23 1044   10/20/23 0600  ceFAZolin  (ANCEF ) IVPB 2g/100 mL premix  Status:  Discontinued        2 g 200 mL/hr over 30 Minutes Intravenous Every 8 hours 10/20/23 0040 10/21/23 1626   10/20/23 0045  ceFAZolin  (ANCEF ) IVPB  Status:  Discontinued       Note to Pharmacy: Indication:  MSSA bacteremia First Dose: Yes Last Day of Therapy:  11/12/23 Labs - Once weekly:  CBC/D and CMP Pull PICC line at the completion of IV antibiotic therapy Method of administration: IV Push or per SNF protocol Method of administration may be changed at the discretion of home infusi   2 g Intravenous Every 8 hours 10/20/23 0030 10/20/23 0038        I have personally reviewed the following labs and images: CBC: Recent Labs  Lab 10/20/23 1916 10/21/23 0550 10/22/23 0250 10/23/23 0328 10/24/23 1005 10/25/23 0635  WBC 8.2 4.9 5.2 4.5 4.7 4.5  NEUTROABS 7.4  --   --   --   --   --   HGB 12.1* 11.7* 10.1* 10.8* 12.6* 12.5*  HCT 35.2* 32.9* 29.0* 32.4* 38.3* 38.0*  MCV 91.7 88.9 91.2 93.9 96.0 95.7  PLT  31* 23* 29* 26* 21* 17*   BMP &GFR Recent Labs  Lab 10/21/23 0550 10/22/23 0250 10/23/23 0328 10/24/23 1005 10/25/23 0834  NA 134* 136 137 142 141  K 4.3 4.1 3.3* 4.5 4.2  CL 105 107 110 112* 112*  CO2 16* 17* 18* 19* 17*  GLUCOSE 88 105* 175* 157* 127*  BUN 33* 35* 40* 40* 44*  CREATININE 1.88* 1.97* 1.73* 1.45* 1.27*  CALCIUM 8.8* 8.4* 8.2* 8.4* 8.6*  MG  --  1.8 2.0 1.9 1.8  PHOS  --  3.3 2.9 2.5 2.6   Estimated Creatinine Clearance: 98.8 mL/min (A) (by C-G formula based on SCr of 1.27 mg/dL (H)). Liver & Pancreas: Recent Labs  Lab  10/21/23 0550 10/22/23 0250 10/23/23 0328 10/24/23 1005 10/25/23 0834  AST 56* 48* 53* 91* 96*  ALT 5 <5 5 9 10   ALKPHOS 139* 136* 150* 172* 174*  BILITOT 4.5* 4.0* 3.3* 3.4* 3.7*  PROT 5.9* 5.3* 5.7* 6.0* 6.0*  ALBUMIN  2.0* 1.8* 1.9* 1.8* 1.8*   No results for input(s): LIPASE, AMYLASE in the last 168 hours. Recent Labs  Lab 10/23/23 0616 10/23/23 1015 10/23/23 1405 10/24/23 2124 10/25/23 0635  AMMONIA 28 35 43* 42* 41*   Diabetic: No results for input(s): HGBA1C in the last 72 hours.  Recent Labs  Lab 10/24/23 2022 10/25/23 0028 10/25/23 0413 10/25/23 0921 10/25/23 1137  GLUCAP 136* 133* 117* 103* 106*   Cardiac Enzymes: No results for input(s): CKTOTAL, CKMB, CKMBINDEX, TROPONINI in the last 168 hours. No results for input(s): PROBNP in the last 8760 hours. Coagulation Profile: Recent Labs  Lab 10/20/23 1916 10/21/23 0550 10/21/23 0858  INR 2.3* 2.6* 2.5*   Thyroid  Function Tests: No results for input(s): TSH, T4TOTAL, FREET4, T3FREE, THYROIDAB in the last 72 hours. Lipid Profile: No results for input(s): CHOL, HDL, LDLCALC, TRIG, CHOLHDL, LDLDIRECT in the last 72 hours. Anemia Panel: No results for input(s): VITAMINB12, FOLATE, FERRITIN, TIBC, IRON , RETICCTPCT in the last 72 hours. Urine analysis:    Component Value Date/Time   COLORURINE YELLOW 09/26/2023 0844   APPEARANCEUR CLEAR 09/26/2023 0844   APPEARANCEUR Clear 09/29/2018 1020   LABSPEC 1.010 09/26/2023 0844   PHURINE 6.0 09/26/2023 0844   GLUCOSEU NEGATIVE 09/26/2023 0844   HGBUR NEGATIVE 09/26/2023 0844   BILIRUBINUR NEGATIVE 09/26/2023 0844   BILIRUBINUR Negative 09/29/2018 1020   KETONESUR NEGATIVE 09/26/2023 0844   PROTEINUR NEGATIVE 09/26/2023 0844   NITRITE NEGATIVE 09/26/2023 0844   LEUKOCYTESUR NEGATIVE 09/26/2023 0844   Sepsis Labs: Invalid input(s): PROCALCITONIN, LACTICIDVEN  Microbiology: Recent Results (from the  past 240 hours)  MRSA Next Gen by PCR, Nasal     Status: Abnormal   Collection Time: 10/20/23  8:59 PM   Specimen: Nasal Mucosa; Nasal Swab  Result Value Ref Range Status   MRSA by PCR Next Gen DETECTED (A) NOT DETECTED Final    Comment: RESULT CALLED TO, READ BACK BY AND VERIFIED WITH: CHRISTELLA PRINGLE RN 10/20/2023 @ 2258 BY AB (NOTE) The GeneXpert MRSA Assay (FDA approved for NASAL specimens only), is one component of a comprehensive MRSA colonization surveillance program. It is not intended to diagnose MRSA infection nor to guide or monitor treatment for MRSA infections. Test performance is not FDA approved in patients less than 39 years old. Performed at [facility], [address] [place], [place] [address]   Culture, blood (Routine X 2) w Reflex to ID Panel     Status: Abnormal   Collection Time: 10/20/23  9:33 PM   Specimen: BLOOD LEFT HAND  Result Value Ref Range Status   Specimen Description BLOOD LEFT HAND  Final   Special Requests   Final    BOTTLES DRAWN AEROBIC AND ANAEROBIC Blood Culture adequate volume   Culture  Setup Time   Final    GRAM NEGATIVE RODS IN BOTH AEROBIC AND ANAEROBIC BOTTLES CRITICAL RESULT CALLED TO, READ BACK BY AND VERIFIED WITH: MAYA CHARLENA SLADE [id number] @ 1610 FH Performed at [facility], [address], [place], [place] [address]    Culture PSEUDOMONAS AERUGINOSA (A)  Final   Report Status 10/24/2023 FINAL  Final   Organism ID, Bacteria PSEUDOMONAS AERUGINOSA  Final      Susceptibility   Pseudomonas aeruginosa - MIC*    MEROPENEM 4 INTERMEDIATE Intermediate     CIPROFLOXACIN  0.12 SENSITIVE Sensitive     PIP/TAZO Value in next row Sensitive      8 SENSITIVEThis is a modified FDA-approved test that has been validated and its performance characteristics determined by the reporting laboratory.  This laboratory is certified under the Clinical Laboratory Improvement Amendments CLIA as qualified to perform high complexity clinical  laboratory testing.    CEFEPIME Value in next row Sensitive      8 SENSITIVEThis is a modified FDA-approved test that has been validated and its performance characteristics determined by the reporting laboratory.  This laboratory is certified under the Clinical Laboratory Improvement Amendments CLIA as qualified to perform high complexity clinical laboratory testing.    CEFTAZIDIME Value in next row Sensitive      8 SENSITIVEThis is a modified FDA-approved test that has been validated and its performance characteristics determined by the reporting laboratory.  This laboratory is certified under the Clinical Laboratory Improvement Amendments CLIA as qualified to perform high complexity clinical laboratory testing.    IMIPENEM Value in next row Sensitive      8 SENSITIVEThis is a modified FDA-approved test that has been validated and its performance characteristics determined by the reporting laboratory.  This laboratory is certified under the Clinical Laboratory Improvement Amendments CLIA as qualified to perform high complexity clinical laboratory testing.    CEFTAZIDIME/AVIBACTAM Value in next row Sensitive      8 SENSITIVEThis is a modified FDA-approved test that has been validated and its performance characteristics determined by the reporting laboratory.  This laboratory is certified under the Clinical Laboratory Improvement Amendments CLIA as qualified to perform high complexity clinical laboratory testing.    CEFTOLOZANE/TAZOBACTAM Value in next row Sensitive      8 SENSITIVEThis is a modified FDA-approved test that has been validated and its performance characteristics determined by the reporting laboratory.  This laboratory is certified under the Clinical Laboratory Improvement Amendments CLIA as qualified to perform high complexity clinical laboratory testing.    TOBRAMYCIN Value in next row Sensitive      8 SENSITIVEThis is a modified FDA-approved test that has been validated and its  performance characteristics determined by the reporting laboratory.  This laboratory is certified under the Clinical Laboratory Improvement Amendments CLIA as qualified to perform high complexity clinical laboratory testing.    * PSEUDOMONAS AERUGINOSA  Blood Culture ID Panel (Reflexed)     Status: Abnormal   Collection Time: 10/20/23  9:33 PM  Result Value Ref Range Status   Enterococcus faecalis NOT DETECTED NOT DETECTED Final   Enterococcus Faecium NOT DETECTED NOT DETECTED Final   Listeria monocytogenes NOT DETECTED NOT DETECTED Final   Staphylococcus species NOT DETECTED NOT  DETECTED Final   Staphylococcus aureus (BCID) NOT DETECTED NOT DETECTED Final   Staphylococcus epidermidis NOT DETECTED NOT DETECTED Final   Staphylococcus lugdunensis NOT DETECTED NOT DETECTED Final   Streptococcus species NOT DETECTED NOT DETECTED Final   Streptococcus agalactiae NOT DETECTED NOT DETECTED Final   Streptococcus pneumoniae NOT DETECTED NOT DETECTED Final   Streptococcus pyogenes NOT DETECTED NOT DETECTED Final   A.calcoaceticus-baumannii NOT DETECTED NOT DETECTED Final   Bacteroides fragilis NOT DETECTED NOT DETECTED Final   Enterobacterales NOT DETECTED NOT DETECTED Final   Enterobacter cloacae complex NOT DETECTED NOT DETECTED Final   Escherichia coli NOT DETECTED NOT DETECTED Final   Klebsiella aerogenes NOT DETECTED NOT DETECTED Final   Klebsiella oxytoca NOT DETECTED NOT DETECTED Final   Klebsiella pneumoniae NOT DETECTED NOT DETECTED Final   Proteus species NOT DETECTED NOT DETECTED Final   Salmonella species NOT DETECTED NOT DETECTED Final   Serratia marcescens NOT DETECTED NOT DETECTED Final   Haemophilus influenzae NOT DETECTED NOT DETECTED Final   Neisseria meningitidis NOT DETECTED NOT DETECTED Final   Pseudomonas aeruginosa DETECTED (A) NOT DETECTED Final    Comment: CRITICAL RESULT CALLED TO, READ BACK BY AND VERIFIED WITH: PHARMD ESABRA SLADE [id number] @ 1610 FH     Stenotrophomonas maltophilia NOT DETECTED NOT DETECTED Final   Candida albicans NOT DETECTED NOT DETECTED Final   Candida auris NOT DETECTED NOT DETECTED Final   Candida glabrata NOT DETECTED NOT DETECTED Final   Candida krusei NOT DETECTED NOT DETECTED Final   Candida parapsilosis NOT DETECTED NOT DETECTED Final   Candida tropicalis NOT DETECTED NOT DETECTED Final   Cryptococcus neoformans/gattii NOT DETECTED NOT DETECTED Final   CTX-M ESBL NOT DETECTED NOT DETECTED Final   Carbapenem resistance IMP NOT DETECTED NOT DETECTED Final   Carbapenem resistance KPC NOT DETECTED NOT DETECTED Final   Carbapenem resistance NDM NOT DETECTED NOT DETECTED Final   Carbapenem resistance VIM NOT DETECTED NOT DETECTED Final    Comment: Performed at [facility], [address] [place], [place] [address]  Culture, blood (Routine X 2) w Reflex to ID Panel     Status: Abnormal   Collection Time: 10/20/23  9:37 PM   Specimen: BLOOD LEFT HAND  Result Value Ref Range Status   Specimen Description BLOOD LEFT HAND  Final   Special Requests   Final    BOTTLES DRAWN AEROBIC AND ANAEROBIC Blood Culture adequate volume   Culture  Setup Time   Final    GRAM NEGATIVE RODS IN BOTH AEROBIC AND ANAEROBIC BOTTLES    Culture (A)  Final    PSEUDOMONAS AERUGINOSA SUSCEPTIBILITIES PERFORMED ON PREVIOUS CULTURE WITHIN THE LAST 5 DAYS. Performed at [facility], [address], [place], [place] [address]    Report Status 10/24/2023 FINAL  Final    Radiology Studies: No results found.    Damario Gillie T. Craig Wisnewski [facility]  If 7PM-7AM, please contact night-coverage [email or web address] 10/27/2023, 5:03 PM

## 2023-10-27 NOTE — Plan of Care (Signed)

## 2023-10-27 NOTE — Progress Notes (Signed)
 Daily Progress Note   Patient Name: [name]       Date: 10/27/2023 DOB: 03-11-1965  Age: 58 y.o. MRN#: [id number] Attending Physician: Kathrin Mignon DASEN, MD Primary Care Physician: Carleen Dallas PARAS, FNP (Inactive) Admit Date: 10/19/2023  Reason for Consultation/Follow-up: Terminal Care   Length of Stay: 7  Current Medications: Scheduled Meds:   collagenase   Topical Daily   furosemide   40 mg Intravenous BID   Gerhardt's butt cream   Topical TID   hydrocerin   Topical BID    HYDROmorphone  (DILAUDID ) injection  0.5 mg Intravenous Q2H   pantoprazole  (PROTONIX ) IV  40 mg Intravenous Q12H    Continuous Infusions:   PRN Meds: acetaminophen  **OR** acetaminophen , antiseptic oral rinse, artificial tears, glycopyrrolate  **OR** glycopyrrolate  **OR** glycopyrrolate , haloperidol **OR** haloperidol **OR** haloperidol lactate, HYDROmorphone  (DILAUDID ) injection, LORazepam  **OR** LORazepam  **OR** LORazepam , ondansetron  **OR** ondansetron  (ZOFRAN ) IV, mouth rinse  Physical Exam Vitals reviewed.  Constitutional:      General: He is sleeping. He is not in acute distress.    Appearance: He is ill-appearing.  HENT:     Mouth/Throat:     Mouth: Mucous membranes are dry.  Pulmonary:     Effort: Pulmonary effort is normal.  Skin:    General: Skin is warm and dry.     Coloration: Skin is jaundiced.     Findings: Bruising present.             Vital Signs: BP (!) 161/138 (BP Location: Left Arm)   Pulse 80   Temp 98 F (36.7 C) (Oral)   Resp 17   Ht 6' (1.829 m)   Wt (!) 165.9 kg   SpO2 100%   BMI 49.60 kg/m  SpO2: SpO2: 100 % O2 Device: O2 Device: Room Air O2 Flow Rate: O2 Flow Rate (L/min): 2 L/min       Palliative Assessment/Data: 20%      Patient Active Problem List    Diagnosis Date Noted   Acute hepatic encephalopathy (HCC) 10/20/2023   Alcoholic cirrhosis (HCC) 10/20/2023   Coagulopathy 10/20/2023   Infective endocarditis 10/20/2023   Adrenal insufficiency 10/20/2023   Pressure injury of right ankle, stage 4 (HCC) 10/20/2023   Acute kidney injury 10/11/2023   Prolonged QT interval 10/11/2023   Hyperammonemia 10/11/2023   Acute endocarditis 10/11/2023  Hypothyroidism 10/11/2023   GERD (gastroesophageal reflux disease) 10/11/2023   AKI (acute kidney injury) 10/11/2023   Staphylococcus aureus bacteremia 10/01/2023   Gram-positive bacteremia 09/30/2023   Paroxysmal atrial fibrillation (HCC) 09/29/2023   Hepatic encephalopathy (HCC) 09/29/2023   Acute metabolic encephalopathy 09/25/2023   Hyponatremia 04/04/2021   Esophageal varices (HCC) 04/28/2020   History of colonic polyps 04/28/2020   Alcohol abuse 04/28/2020   Acute shoulder pain due to trauma, right 10/12/2019   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 10/12/2019   Localized osteoarthritis of wrist 10/12/2019   Nontraumatic complete tear of right rotator cuff 10/12/2019   Cocaine abuse (HCC) 09/09/2019   1st degree AV block 08/17/2019   Lyme disease 08/17/2019   Fever of unknown origin 08/16/2019   Elevated AST (SGOT) 08/04/2019   Dyslipidemia 07/14/2019   Iron  deficiency anemia 07/08/2019   Migraine without aura 05/18/2019   Acute on chronic diastolic CHF (congestive heart failure) (HCC) 05/14/2019   Scrotal edema 04/22/2019   Bilateral hydrocele 04/22/2019   Pancytopenia (HCC)    Long-term current use of opiate analgesic 03/21/2019   Seizure-like activity (HCC) 03/21/2019   Meralgia paresthetica of left side 03/21/2019   Polyarthropathy 03/21/2019   Arm mass, right 07/31/2017   Bilateral primary osteoarthritis of knee 04/04/2017   Depression, recurrent 07/18/2016   Encounter for screening for other viral diseases 06/20/2016   Mixed hyperlipidemia 10/31/2015    Leukopenia 07/08/2015   Abdominal wall bulge 03/02/2015   Portal hypertension (HCC) 08/24/2014   Hypokalemia 08/24/2014   COPD, severity to be determined (HCC) 08/24/2014   Biliary colic 07/14/2014   CHF (congestive heart failure) (HCC) 07/14/2014   H/O ETOH abuse 07/14/2014   Thrombocytopenia 07/30/2013   Chronic pain disorder 07/30/2013   Edema 07/30/2013   Genu varus    Hypogonadism male 05/05/2012   Umbilical hernia without obstruction and without gangrene 12/26/2011   Asthma 08/28/2011   Cirrhosis (HCC) 08/14/2011   UNSPECIFIED TACHYCARDIA 02/01/2010   Gout 05/22/2008   Hypertension, essential 05/22/2008   Atrial fibrillation, chronic (HCC) 05/22/2008   DIASTOLIC HEART FAILURE, CHRONIC 05/22/2008   Acute on chronic diastolic heart failure (HCC) 05/22/2008   Morbid obesity with BMI of 50.0-59.9, adult (HCC) 02/21/2007   Coronary atherosclerosis 02/21/2007   Obstructive sleep apnea of adult 02/04/2007    Palliative Care Assessment & Plan   Patient Profile: 58 y.o. male  with past medical history of EtOH cirrhosis, portal vein thrombosis, thrombocytopenia, esophageal varices with recent admission for hepatic encephalopathy, MSSA bacteremia with infective endocarditis on cefazolin  therapy, adrenal insufficiency who presented with altered mental status.  He was admitted on 10/19/2023 with altered mental status, hepatic encephalopathy, sepsis due to Pseudomonas bacteremia, history of MSSA endocarditis, adrenal insufficiency, AKI.    Palliative medicine was consulted for GOC conversations and attempts to facilitate decision making between medical team and emergency legal guardian.    Assessment: Chart reviewed. Continues with scheduled dilaudid . He did not require any prn medications in the last 24 hours. Plan is for in hospital death. Patient sleeping in NAD. He does not easily awaken so let him sleep. No family at bedside. PMT will continue to  follow.  Recommendations/Plan: Continue comfort measures only -see MAR for medication orders Per previous notes, family requested in hospital death PMT will follow    Code Status:    Code Status Orders  (From admission, onward)           Start     Ordered  10/25/23 1055  Do not attempt resuscitation (DNR) - Comfort care  Continuous       Question Answer Comment  If patient has no pulse and is not breathing Do Not Attempt Resuscitation   In Pre-Arrest Conditions (Patient Is Breathing and Has a Pulse) Provide comfort measures. Relieve any mechanical airway obstruction. Avoid transfer unless required for comfort.   Consent: Discussion documented in EHR or advanced directives reviewed      10/25/23 1056           Extensive chart review has been completed prior to seeing the patient including vital signs, progress/consult notes, orders, medications, and available advance directive documents.  Time spent: 25 minutes  Thank you for allowing the Palliative Medicine Team to assist in the care of this patient.    Stephane CHRISTELLA Palin, NP  Please contact Palliative Medicine Team phone at [phone] for questions and concerns.

## 2023-10-28 DIAGNOSIS — Z515 Encounter for palliative care: Secondary | ICD-10-CM | POA: Diagnosis not present

## 2023-10-28 DIAGNOSIS — K7682 Hepatic encephalopathy: Secondary | ICD-10-CM | POA: Diagnosis not present

## 2023-10-28 DIAGNOSIS — Z7189 Other specified counseling: Secondary | ICD-10-CM | POA: Diagnosis not present

## 2023-10-28 MED ORDER — HYDROMORPHONE HCL-NACL 50-0.9 MG/50ML-% IV SOLN
0.5000 mg/h | INTRAVENOUS | Status: DC
  Administered 2023-10-28 – 2023-10-30 (×2): 0.5 mg/h via INTRAVENOUS
  Filled 2023-10-28 (×4): qty 50

## 2023-10-28 MED ORDER — HYDROMORPHONE BOLUS VIA INFUSION
0.5000 mg | INTRAVENOUS | Status: DC | PRN
  Administered 2023-10-29 (×2): 0.5 mg via INTRAVENOUS
  Administered 2023-10-30 – 2023-11-03 (×10): 1 mg via INTRAVENOUS

## 2023-10-28 NOTE — Plan of Care (Signed)

## 2023-10-28 NOTE — Plan of Care (Signed)
   Problem: Pain Managment: Goal: General experience of comfort will improve and/or be controlled Outcome: Progressing   Problem: Skin Integrity: Goal: Risk for impaired skin integrity will decrease Outcome: Progressing

## 2023-10-28 NOTE — TOC Transition Note (Signed)
 Transition of Care [facility]) - Discharge Note   Patient Details  Name: [name] MRN: [id number] Date of Birth: 08-Apr-1965  Transition of Care [facility]) CM/SW Contact:  Tom-Johnson, Harvest Muskrat, RN Phone Number: 10/28/2023, 9:14 AM   Clinical Narrative:     Comfort care continues, anticipated Hospital death. Palliative following.  CM will continue to follow and render compassionate support at this time.        Barriers to Discharge: Continued Medical Work up   Patient Goals and CMS Choice Patient states their goals for this hospitalization and ongoing recovery are:: Return to CV          Discharge Placement                       Discharge Plan and Services Additional resources added to the After Visit Summary for   In-house Referral: Clinical Social Work, Hospice / Palliative Care   Post Acute Care Choice: Skilled Nursing Facility                               Social Drivers of Health (SDOH) Interventions SDOH Screenings   Food Insecurity: Patient Unable To Answer (10/21/2023)  Housing: Low Risk  (09/30/2023)  Transportation Needs: Patient Unable To Answer (09/25/2023)  Utilities: Patient Unable To Answer (09/25/2023)  Recent Concern: Utilities - At Risk (08/18/2023)   Received from [facility]  Depression (PHQ2-9): Low Risk  (03/07/2020)  Financial Resource Strain: Low Risk  (07/22/2023)   Received from [facility]  Physical Activity: Inactive (07/22/2023)   Received from [facility]  Social Connections: Moderately Isolated (07/22/2023)   Received from [facility]  Stress: No Stress Concern Present (08/18/2023)   Received from [facility]  Tobacco Use: High Risk (10/19/2023)  Health Literacy: Low Risk  (07/22/2023)   Received from [facility]     Readmission Risk Interventions    10/16/2023    1:09 PM 10/03/2023    9:50 AM  Readmission Risk Prevention Plan  Transportation Screening Complete Complete  HRI or Home Care  Consult  Complete  Social Work Consult for Recovery Care Planning/Counseling  Complete  Palliative Care Screening  Not Applicable  Medication Review Oceanographer) Complete Complete  HRI or Home Care Consult Complete   SW Recovery Care/Counseling Consult Complete   Palliative Care Screening Complete   Skilled Nursing Facility Complete

## 2023-10-28 NOTE — Progress Notes (Signed)
 PROGRESS NOTE  KAO [name] FMW:[id number] DOB: 1965-12-01   PCP: Carleen Dallas PARAS, FNP (Inactive)  Patient is from: SNF  DOA: 10/19/2023 LOS: 8  Chief complaints Chief Complaint  Patient presents with   Altered Mental Status     Brief Narrative / Interim history: 58 year old M with PMH of cirrhosis with mesenteric/portal vein thrombosis, esophageal varices, thrombocytopenia, hepatic encephalopathy, HFpEF, A-fib not on AC, adrenal insufficiency, hypertension MSSA bacteremia with presumed infective endocarditis on IV Ancef  and recent hospitalization from 10/2-10/8 at [facility] with acute hepatic encephalopathy and AKI return to [facility] with increased confusion and right gaze preference.  Ammonia level was elevated to 247.  VBG without significant finding.  Teleneurology consulted.  There was concern about seizure/CVA but patient was not a candidate for IV thrombolytics given esophageal varices and thrombocytopenia.  CT head and CT angio head and neck without acute finding but concern for bilateral bronchopneumonia.  He was loaded with Keppra and transferred to [facility] for neuro workup and EEG monitoring.  Further workup revealed Pseudomonas bacteremia.  Antibiotic escalated to IV cefepime after discussion with ID.   Patient has had worsening encephalopathy despite improvement in his ammonia level.  LTM was reviewed by neurology concerned, originally showing diffuse slowing but around noon patient developed burst suppression pattern but on no burst suppression therapy.  Neurology recommended stat CT of head due to severe low platelet count of 29,000.  PCCM was consulted and he was moved to ICU on 10/12.  Repeat CT head without acute finding.  MRI brain on 10/15 without acute finding.  Keppra discontinued.  Patient remained encephalopathic.  Palliative medicine involved.  Patient was transferred to hospitalist service on 10/16.  He was transition to full comfort care on  10/17 per discussion and lung palliative medicine, DSS (legal guardian) and stepson.  Anticipate in-hospital death  Subjective: Seen and examined earlier this morning.  No major events overnight or this morning.  Looks comfortable.   Assessment and plan: End-of-life care/full comfort care-initiated on 10/17 by palliative medicine after discussion with DSS and stepson.  Looks comfortable. -Appreciate help by palliative medicine -Continue comfort meds. -Anticipate in hospital death  Decompensated liver cirrhosis Acute hepatic encephalopathy Acute metabolic encephalopathy Anion GAP Metabolic Acidosis  Hyperammonemia, improving History of esophageal varices Anemia of critical illness Acute on chronic thrombocytopenia Anasarca   Paroxysmal atrial fibrillation   AKI/acute urinary retention - Continue foley for comfort care.   Sepsis due to pseudomonas bacteremia   Infective endocarditis Chronic diastolic heart failure   Hypothyroidism   Adrenal insufficiency    DVT prophylaxis-full comfort care.   Nutrition/inadequate oral intake Body mass index is 49.6 kg/m. Nutrition Problem: Inadequate oral intake Etiology: inability to eat Signs/Symptoms: NPO status Interventions: Tube feeding  Pressure skin injury: Present on arrival. Wound 09/27/23 1212 Pressure Injury Sacrum Stage 1 -  Intact skin with non-blanchable redness of a localized area usually over a bony prominence. (Active)     Wound 10/10/23 2235 Pressure Injury Heel Right Unstageable - Full thickness tissue loss in which the base of the injury is covered by slough (yellow, tan, gray, green or brown) and/or eschar (tan, brown or black) in the wound bed. (Active)     Wound 10/20/23 Pressure Injury Buttocks Right;Left Stage 3 -  Full thickness tissue loss. Subcutaneous fat may be visible but bone, tendon or muscle are NOT exposed. (Active)    Code Status: DNR-comfort Family Communication: None at bedside. Level of  care: Med-Surg Status  is: Inpatient Remains inpatient appropriate because: End-of-life care   Final disposition: Anticipated in hospital death   35 minutes with more than 50% spent in reviewing records, counseling patient/family and coordinating care.  Consultants:  Neurology Infectious disease Palliative medicine  Procedures: None  Microbiology summarized: 10/12-blood culture with Pseudomonas aeruginosa  Objective: Vitals:   10/25/23 1905 10/26/23 0739 10/27/23 0751 10/28/23 0904  BP: 123/85 136/88 (!) 161/138 (!) 136/98  Pulse: 100 (!) 115 80 (!) 112  Resp: 16 16 17 19   Temp: (!) 97.3 F (36.3 C) 97.6 F (36.4 C) 98 F (36.7 C) 98.2 F (36.8 C)  TempSrc: Oral Oral Oral Oral  SpO2: 99% 93% 100% 100%  Weight:      Height:        Examination:  GENERAL: No apparent distress.  HEENT: MMM.  Vision and hearing grossly intact.  NECK: Supple.  No apparent JVD.  RESP:  No IWOB.  Fair aeration bilaterally. CVS:  RRR. Heart sounds normal.  ABD/GI/GU: BS+. Abd soft, NTND.  Scrotal edema. MSK/EXT:  Moves  extremities.  Edema/anasarca. SKIN: no apparent skin lesion or wound NEURO: Somnolent.  Wakes to voice.  Oriented to self and hospital.  Does not follow commands. PSYCH: Calm. Normal affect.   Sch Meds:  Scheduled Meds:  collagenase   Topical Daily   Gerhardt's butt cream   Topical TID   hydrocerin   Topical BID   Continuous Infusions:  HYDROmorphone  0.5 mg/hr (10/28/23 1407)   PRN Meds:.acetaminophen  **OR** acetaminophen , antiseptic oral rinse, artificial tears, glycopyrrolate  **OR** glycopyrrolate  **OR** glycopyrrolate , haloperidol **OR** haloperidol **OR** haloperidol lactate, HYDROmorphone , LORazepam  **OR** LORazepam  **OR** LORazepam , ondansetron  **OR** ondansetron  (ZOFRAN ) IV, mouth rinse  Antimicrobials: Anti-infectives (From admission, onward)    Start     Dose/Rate Route Frequency Ordered Stop   10/21/23 1800  ceFEPIme (MAXIPIME) 2 g in sodium chloride   0.9 % 100 mL IVPB  Status:  Discontinued        2 g 200 mL/hr over 30 Minutes Intravenous Every 8 hours 10/21/23 1626 10/25/23 1053   10/20/23 1815  metroNIDAZOLE (FLAGYL) IVPB 500 mg  Status:  Discontinued        500 mg 100 mL/hr over 60 Minutes Intravenous Every 12 hours 10/20/23 1808 10/21/23 1044   10/20/23 0600  ceFAZolin  (ANCEF ) IVPB 2g/100 mL premix  Status:  Discontinued        2 g 200 mL/hr over 30 Minutes Intravenous Every 8 hours 10/20/23 0040 10/21/23 1626   10/20/23 0045  ceFAZolin  (ANCEF ) IVPB  Status:  Discontinued       Note to Pharmacy: Indication:  MSSA bacteremia First Dose: Yes Last Day of Therapy:  11/12/23 Labs - Once weekly:  CBC/D and CMP Pull PICC line at the completion of IV antibiotic therapy Method of administration: IV Push or per SNF protocol Method of administration may be changed at the discretion of home infusi   2 g Intravenous Every 8 hours 10/20/23 0030 10/20/23 0038        I have personally reviewed the following labs and images: CBC: Recent Labs  Lab 10/22/23 0250 10/23/23 0328 10/24/23 1005 10/25/23 0635  WBC 5.2 4.5 4.7 4.5  HGB 10.1* 10.8* 12.6* 12.5*  HCT 29.0* 32.4* 38.3* 38.0*  MCV 91.2 93.9 96.0 95.7  PLT 29* 26* 21* 17*   BMP &GFR Recent Labs  Lab 10/22/23 0250 10/23/23 0328 10/24/23 1005 10/25/23 0834  NA 136 137 142 141  K 4.1 3.3* 4.5 4.2  CL 107 110  112* 112*  CO2 17* 18* 19* 17*  GLUCOSE 105* 175* 157* 127*  BUN 35* 40* 40* 44*  CREATININE 1.97* 1.73* 1.45* 1.27*  CALCIUM 8.4* 8.2* 8.4* 8.6*  MG 1.8 2.0 1.9 1.8  PHOS 3.3 2.9 2.5 2.6   Estimated Creatinine Clearance: 98.8 mL/min (A) (by C-G formula based on SCr of 1.27 mg/dL (H)). Liver & Pancreas: Recent Labs  Lab 10/22/23 0250 10/23/23 0328 10/24/23 1005 10/25/23 0834  AST 48* 53* 91* 96*  ALT <5 5 9 10   ALKPHOS 136* 150* 172* 174*  BILITOT 4.0* 3.3* 3.4* 3.7*  PROT 5.3* 5.7* 6.0* 6.0*  ALBUMIN  1.8* 1.9* 1.8* 1.8*   No results for input(s):  LIPASE, AMYLASE in the last 168 hours. Recent Labs  Lab 10/23/23 0616 10/23/23 1015 10/23/23 1405 10/24/23 2124 10/25/23 0635  AMMONIA 28 35 43* 42* 41*   Diabetic: No results for input(s): HGBA1C in the last 72 hours.  Recent Labs  Lab 10/24/23 2022 10/25/23 0028 10/25/23 0413 10/25/23 0921 10/25/23 1137  GLUCAP 136* 133* 117* 103* 106*   Cardiac Enzymes: No results for input(s): CKTOTAL, CKMB, CKMBINDEX, TROPONINI in the last 168 hours. No results for input(s): PROBNP in the last 8760 hours. Coagulation Profile: No results for input(s): INR, PROTIME in the last 168 hours.  Thyroid  Function Tests: No results for input(s): TSH, T4TOTAL, FREET4, T3FREE, THYROIDAB in the last 72 hours. Lipid Profile: No results for input(s): CHOL, HDL, LDLCALC, TRIG, CHOLHDL, LDLDIRECT in the last 72 hours. Anemia Panel: No results for input(s): VITAMINB12, FOLATE, FERRITIN, TIBC, IRON , RETICCTPCT in the last 72 hours. Urine analysis:    Component Value Date/Time   COLORURINE YELLOW 09/26/2023 0844   APPEARANCEUR CLEAR 09/26/2023 0844   APPEARANCEUR Clear 09/29/2018 1020   LABSPEC 1.010 09/26/2023 0844   PHURINE 6.0 09/26/2023 0844   GLUCOSEU NEGATIVE 09/26/2023 0844   HGBUR NEGATIVE 09/26/2023 0844   BILIRUBINUR NEGATIVE 09/26/2023 0844   BILIRUBINUR Negative 09/29/2018 1020   KETONESUR NEGATIVE 09/26/2023 0844   PROTEINUR NEGATIVE 09/26/2023 0844   NITRITE NEGATIVE 09/26/2023 0844   LEUKOCYTESUR NEGATIVE 09/26/2023 0844   Sepsis Labs: Invalid input(s): PROCALCITONIN, LACTICIDVEN  Microbiology: Recent Results (from the past 240 hours)  MRSA Next Gen by PCR, Nasal     Status: Abnormal   Collection Time: 10/20/23  8:59 PM   Specimen: Nasal Mucosa; Nasal Swab  Result Value Ref Range Status   MRSA by PCR Next Gen DETECTED (A) NOT DETECTED Final    Comment: RESULT CALLED TO, READ BACK BY AND VERIFIED WITH: CHRISTELLA PRINGLE RN  10/20/2023 @ 2258 BY AB (NOTE) The GeneXpert MRSA Assay (FDA approved for NASAL specimens only), is one component of a comprehensive MRSA colonization surveillance program. It is not intended to diagnose MRSA infection nor to guide or monitor treatment for MRSA infections. Test performance is not FDA approved in patients less than 70 years old. Performed at [facility], [address], [place], [place] [address]   Culture, blood (Routine X 2) w Reflex to ID Panel     Status: Abnormal   Collection Time: 10/20/23  9:33 PM   Specimen: BLOOD LEFT HAND  Result Value Ref Range Status   Specimen Description BLOOD LEFT HAND  Final   Special Requests   Final    BOTTLES DRAWN AEROBIC AND ANAEROBIC Blood Culture adequate volume   Culture  Setup Time   Final    GRAM NEGATIVE RODS IN BOTH AEROBIC AND ANAEROBIC BOTTLES CRITICAL RESULT CALLED  TO, READ BACK BY AND VERIFIED WITH: MAYA CHARLENA SLADE [phone] @ 1610 FH Performed at [facility], [address], [place], [place] [address]    Culture PSEUDOMONAS AERUGINOSA (A)  Final   Report Status 10/24/2023 FINAL  Final   Organism ID, Bacteria PSEUDOMONAS AERUGINOSA  Final      Susceptibility   Pseudomonas aeruginosa - MIC*    MEROPENEM 4 INTERMEDIATE Intermediate     CIPROFLOXACIN  0.12 SENSITIVE Sensitive     PIP/TAZO Value in next row Sensitive      8 SENSITIVEThis is a modified FDA-approved test that has been validated and its performance characteristics determined by the reporting laboratory.  This laboratory is certified under the Clinical Laboratory Improvement Amendments CLIA as qualified to perform high complexity clinical laboratory testing.    CEFEPIME Value in next row Sensitive      8 SENSITIVEThis is a modified FDA-approved test that has been validated and its performance characteristics determined by the reporting laboratory.  This laboratory is certified under the Clinical Laboratory Improvement Amendments CLIA as qualified  to perform high complexity clinical laboratory testing.    CEFTAZIDIME Value in next row Sensitive      8 SENSITIVEThis is a modified FDA-approved test that has been validated and its performance characteristics determined by the reporting laboratory.  This laboratory is certified under the Clinical Laboratory Improvement Amendments CLIA as qualified to perform high complexity clinical laboratory testing.    IMIPENEM Value in next row Sensitive      8 SENSITIVEThis is a modified FDA-approved test that has been validated and its performance characteristics determined by the reporting laboratory.  This laboratory is certified under the Clinical Laboratory Improvement Amendments CLIA as qualified to perform high complexity clinical laboratory testing.    CEFTAZIDIME/AVIBACTAM Value in next row Sensitive      8 SENSITIVEThis is a modified FDA-approved test that has been validated and its performance characteristics determined by the reporting laboratory.  This laboratory is certified under the Clinical Laboratory Improvement Amendments CLIA as qualified to perform high complexity clinical laboratory testing.    CEFTOLOZANE/TAZOBACTAM Value in next row Sensitive      8 SENSITIVEThis is a modified FDA-approved test that has been validated and its performance characteristics determined by the reporting laboratory.  This laboratory is certified under the Clinical Laboratory Improvement Amendments CLIA as qualified to perform high complexity clinical laboratory testing.    TOBRAMYCIN Value in next row Sensitive      8 SENSITIVEThis is a modified FDA-approved test that has been validated and its performance characteristics determined by the reporting laboratory.  This laboratory is certified under the Clinical Laboratory Improvement Amendments CLIA as qualified to perform high complexity clinical laboratory testing.    * PSEUDOMONAS AERUGINOSA  Blood Culture ID Panel (Reflexed)     Status: Abnormal   Collection  Time: 10/20/23  9:33 PM  Result Value Ref Range Status   Enterococcus faecalis NOT DETECTED NOT DETECTED Final   Enterococcus Faecium NOT DETECTED NOT DETECTED Final   Listeria monocytogenes NOT DETECTED NOT DETECTED Final   Staphylococcus species NOT DETECTED NOT DETECTED Final   Staphylococcus aureus (BCID) NOT DETECTED NOT DETECTED Final   Staphylococcus epidermidis NOT DETECTED NOT DETECTED Final   Staphylococcus lugdunensis NOT DETECTED NOT DETECTED Final   Streptococcus species NOT DETECTED NOT DETECTED Final   Streptococcus agalactiae NOT DETECTED NOT DETECTED Final   Streptococcus pneumoniae NOT DETECTED NOT DETECTED Final   Streptococcus pyogenes NOT DETECTED NOT DETECTED Final  A.calcoaceticus-baumannii NOT DETECTED NOT DETECTED Final   Bacteroides fragilis NOT DETECTED NOT DETECTED Final   Enterobacterales NOT DETECTED NOT DETECTED Final   Enterobacter cloacae complex NOT DETECTED NOT DETECTED Final   Escherichia coli NOT DETECTED NOT DETECTED Final   Klebsiella aerogenes NOT DETECTED NOT DETECTED Final   Klebsiella oxytoca NOT DETECTED NOT DETECTED Final   Klebsiella pneumoniae NOT DETECTED NOT DETECTED Final   Proteus species NOT DETECTED NOT DETECTED Final   Salmonella species NOT DETECTED NOT DETECTED Final   Serratia marcescens NOT DETECTED NOT DETECTED Final   Haemophilus influenzae NOT DETECTED NOT DETECTED Final   Neisseria meningitidis NOT DETECTED NOT DETECTED Final   Pseudomonas aeruginosa DETECTED (A) NOT DETECTED Final    Comment: CRITICAL RESULT CALLED TO, READ BACK BY AND VERIFIED WITH: PHARMD ESABRA SLADE [phone] @ 1610 FH    Stenotrophomonas maltophilia NOT DETECTED NOT DETECTED Final   Candida albicans NOT DETECTED NOT DETECTED Final   Candida auris NOT DETECTED NOT DETECTED Final   Candida glabrata NOT DETECTED NOT DETECTED Final   Candida krusei NOT DETECTED NOT DETECTED Final   Candida parapsilosis NOT DETECTED NOT DETECTED Final   Candida  tropicalis NOT DETECTED NOT DETECTED Final   Cryptococcus neoformans/gattii NOT DETECTED NOT DETECTED Final   CTX-M ESBL NOT DETECTED NOT DETECTED Final   Carbapenem resistance IMP NOT DETECTED NOT DETECTED Final   Carbapenem resistance KPC NOT DETECTED NOT DETECTED Final   Carbapenem resistance NDM NOT DETECTED NOT DETECTED Final   Carbapenem resistance VIM NOT DETECTED NOT DETECTED Final    Comment: Performed at [facility], [address], [place], [place] [address]  Culture, blood (Routine X 2) w Reflex to ID Panel     Status: Abnormal   Collection Time: 10/20/23  9:37 PM   Specimen: BLOOD LEFT HAND  Result Value Ref Range Status   Specimen Description BLOOD LEFT HAND  Final   Special Requests   Final    BOTTLES DRAWN AEROBIC AND ANAEROBIC Blood Culture adequate volume   Culture  Setup Time   Final    GRAM NEGATIVE RODS IN BOTH AEROBIC AND ANAEROBIC BOTTLES    Culture (A)  Final    PSEUDOMONAS AERUGINOSA SUSCEPTIBILITIES PERFORMED ON PREVIOUS CULTURE WITHIN THE LAST 5 DAYS. Performed at [facility], [address], [place], [place] [address]    Report Status 10/24/2023 FINAL  Final    Radiology Studies: No results found.    Lynley Killilea T. Chirstina Haan [facility]  If 7PM-7AM, please contact night-coverage [email or web address] 10/28/2023, 3:07 PM

## 2023-10-28 NOTE — Progress Notes (Addendum)
 Daily Progress Note   Patient Name: [name]       Date: 10/28/2023 DOB: 06-11-1965  Age: 58 y.o. MRN#: [id number] Attending Physician: [name] Mignon DASEN, MD Primary Care Physician: [name] Dallas PARAS, FNP (Inactive) Admit Date: 10/19/2023  Reason for Consultation/Follow-up: Terminal Care   Length of Stay: 8  Current Medications: Scheduled Meds:   collagenase   Topical Daily   Gerhardt's butt cream   Topical TID   hydrocerin   Topical BID    Continuous Infusions:  HYDROmorphone       PRN Meds: acetaminophen  **OR** acetaminophen , antiseptic oral rinse, artificial tears, glycopyrrolate  **OR** glycopyrrolate  **OR** glycopyrrolate , haloperidol **OR** haloperidol **OR** haloperidol lactate, HYDROmorphone , LORazepam  **OR** LORazepam  **OR** LORazepam , ondansetron  **OR** ondansetron  (ZOFRAN ) IV, mouth rinse  Physical Exam Vitals reviewed.  Constitutional:      General: He is awake. He is not in acute distress.    Appearance: He is ill-appearing.  HENT:     Mouth/Throat:     Mouth: Mucous membranes are dry.  Pulmonary:     Effort: Pulmonary effort is normal.  Skin:    General: Skin is warm and dry.     Coloration: Skin is jaundiced.     Findings: Bruising present.             Vital Signs: BP (!) 136/98 (BP Location: Right Wrist)   Pulse (!) 112   Temp 98.2 F (36.8 C) (Oral)   Resp 19   Ht 6' (1.829 m)   Wt (!) 165.9 kg   SpO2 100%   BMI 49.60 kg/m  SpO2: SpO2: 100 % O2 Device: O2 Device: Room Air O2 Flow Rate: O2 Flow Rate (L/min): 2 L/min       Palliative Assessment/Data: 20%      Patient Active Problem List   Diagnosis Date Noted   Acute hepatic encephalopathy (HCC) 10/20/2023   Alcoholic cirrhosis (HCC) 10/20/2023   Coagulopathy 10/20/2023    Infective endocarditis 10/20/2023   Adrenal insufficiency 10/20/2023   Pressure injury of right ankle, stage 4 (HCC) 10/20/2023   Acute kidney injury 10/11/2023   Prolonged QT interval 10/11/2023   Hyperammonemia 10/11/2023   Acute endocarditis 10/11/2023   Hypothyroidism 10/11/2023   GERD (gastroesophageal reflux disease) 10/11/2023   AKI (acute kidney injury) 10/11/2023   Staphylococcus aureus bacteremia 10/01/2023  Gram-positive bacteremia 09/30/2023   Paroxysmal atrial fibrillation (HCC) 09/29/2023   Hepatic encephalopathy (HCC) 09/29/2023   Acute metabolic encephalopathy 09/25/2023   Hyponatremia 04/04/2021   Esophageal varices (HCC) 04/28/2020   History of colonic polyps 04/28/2020   Alcohol abuse 04/28/2020   Acute shoulder pain due to trauma, right 10/12/2019   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 10/12/2019   Localized osteoarthritis of wrist 10/12/2019   Nontraumatic complete tear of right rotator cuff 10/12/2019   Cocaine abuse (HCC) 09/09/2019   1st degree AV block 08/17/2019   Lyme disease 08/17/2019   Fever of unknown origin 08/16/2019   Elevated AST (SGOT) 08/04/2019   Dyslipidemia 07/14/2019   Iron  deficiency anemia 07/08/2019   Migraine without aura 05/18/2019   Acute on chronic diastolic CHF (congestive heart failure) (HCC) 05/14/2019   Scrotal edema 04/22/2019   Bilateral hydrocele 04/22/2019   Pancytopenia (HCC)    Long-term current use of opiate analgesic 03/21/2019   Seizure-like activity (HCC) 03/21/2019   Meralgia paresthetica of left side 03/21/2019   Polyarthropathy 03/21/2019   Arm mass, right 07/31/2017   Bilateral primary osteoarthritis of knee 04/04/2017   Depression, recurrent 07/18/2016   Encounter for screening for other viral diseases 06/20/2016   Mixed hyperlipidemia 10/31/2015   Leukopenia 07/08/2015   Abdominal wall bulge 03/02/2015   Portal hypertension (HCC) 08/24/2014   Hypokalemia 08/24/2014   COPD, severity  to be determined (HCC) 08/24/2014   Biliary colic 07/14/2014   CHF (congestive heart failure) (HCC) 07/14/2014   H/O ETOH abuse 07/14/2014   Thrombocytopenia 07/30/2013   Chronic pain disorder 07/30/2013   Edema 07/30/2013   Genu varus    Hypogonadism male 05/05/2012   Umbilical hernia without obstruction and without gangrene 12/26/2011   Asthma 08/28/2011   Cirrhosis (HCC) 08/14/2011   UNSPECIFIED TACHYCARDIA 02/01/2010   Gout 05/22/2008   Hypertension, essential 05/22/2008   Atrial fibrillation, chronic (HCC) 05/22/2008   DIASTOLIC HEART FAILURE, CHRONIC 05/22/2008   Acute on chronic diastolic heart failure (HCC) 05/22/2008   Morbid obesity with BMI of 50.0-59.9, adult (HCC) 02/21/2007   Coronary atherosclerosis 02/21/2007   Obstructive sleep apnea of adult 02/04/2007    Palliative Care Assessment & Plan   Patient Profile: 58 y.o. male  with past medical history of EtOH cirrhosis, portal vein thrombosis, thrombocytopenia, esophageal varices with recent admission for hepatic encephalopathy, MSSA bacteremia with infective endocarditis on cefazolin  therapy, adrenal insufficiency who presented with altered mental status.  He was admitted on 10/19/2023 with altered mental status, hepatic encephalopathy, sepsis due to Pseudomonas bacteremia, history of MSSA endocarditis, adrenal insufficiency, AKI.    Palliative medicine was consulted for GOC conversations and attempts to facilitate decision making between medical team and emergency legal guardian.    Assessment: Chart reviewed. Patient appears comfortable with 1 mg scheduled IV dilaudid  every 2 hours. Changed this to the equivalent dose of dilaudid  but through continuous infusion at 0.5mg /hr. Placed order for PRN dialudid if needed. Plan is for in hospital death. Patient awake in NAD. He greeted me and asked for fruit. No family at bedside. PMT will continue to follow.  Returned call to [facility]. Updated her on patient  condition.  Recommendations/Plan: Continue comfort measures only -see MAR for medication orders Per previous notes, family requested in hospital death PMT will follow    Code Status:    Code Status Orders  (From admission, onward)           Start  Ordered   10/25/23 1055  Do not attempt resuscitation (DNR) - Comfort care  Continuous       Question Answer Comment  If patient has no pulse and is not breathing Do Not Attempt Resuscitation   In Pre-Arrest Conditions (Patient Is Breathing and Has a Pulse) Provide comfort measures. Relieve any mechanical airway obstruction. Avoid transfer unless required for comfort.   Consent: Discussion documented in EHR or advanced directives reviewed      10/25/23 1056           Extensive chart review has been completed prior to seeing the patient including vital signs, progress/consult notes, orders, medications, and available advance directive documents.  Time spent: 25 minutes  Thank you for allowing the Palliative Medicine Team to assist in the care of this patient.    Stephane CHRISTELLA Palin, NP  Please contact Palliative Medicine Team phone at [phone] for questions and concerns.

## 2023-10-29 ENCOUNTER — Telehealth: Admitting: Infectious Diseases

## 2023-10-29 DIAGNOSIS — Z66 Do not resuscitate: Secondary | ICD-10-CM | POA: Diagnosis not present

## 2023-10-29 DIAGNOSIS — H510 Palsy (spasm) of conjugate gaze: Secondary | ICD-10-CM | POA: Diagnosis not present

## 2023-10-29 DIAGNOSIS — Z515 Encounter for palliative care: Secondary | ICD-10-CM | POA: Diagnosis not present

## 2023-10-29 DIAGNOSIS — K7682 Hepatic encephalopathy: Secondary | ICD-10-CM | POA: Diagnosis not present

## 2023-10-29 DIAGNOSIS — R451 Restlessness and agitation: Secondary | ICD-10-CM

## 2023-10-29 DIAGNOSIS — R638 Other symptoms and signs concerning food and fluid intake: Secondary | ICD-10-CM

## 2023-10-29 NOTE — Progress Notes (Signed)
 PROGRESS NOTE  [name] FMW:[id number] DOB: 1965-01-18   PCP: Carleen Dallas PARAS, FNP (Inactive)  Patient is from: SNF  DOA: 10/19/2023 LOS: 9  Chief complaints Chief Complaint  Patient presents with   Altered Mental Status     Brief Narrative / Interim history: 58 year old M with PMH of cirrhosis with mesenteric/portal vein thrombosis, esophageal varices, thrombocytopenia, hepatic encephalopathy, HFpEF, A-fib not on AC, adrenal insufficiency, hypertension MSSA bacteremia with presumed infective endocarditis on IV Ancef  and recent hospitalization from 10/2-10/8 at [facility] with acute hepatic encephalopathy and AKI return to [facility] with increased confusion and right gaze preference.  Ammonia level was elevated to 247.  VBG without significant finding.  Teleneurology consulted.  There was concern about seizure/CVA but patient was not a candidate for IV thrombolytics given esophageal varices and thrombocytopenia.  CT head and CT angio head and neck without acute finding but concern for bilateral bronchopneumonia.  He was loaded with Keppra and transferred to [facility] for neuro workup and EEG monitoring.  Further workup revealed Pseudomonas bacteremia.  Antibiotic escalated to IV cefepime after discussion with ID.   Patient has had worsening encephalopathy despite improvement in his ammonia level.  LTM was reviewed by neurology concerned, originally showing diffuse slowing but around noon patient developed burst suppression pattern but on no burst suppression therapy.  Neurology recommended stat CT of head due to severe low platelet count of 29,000.  PCCM was consulted and he was moved to ICU on 10/12.  Repeat CT head without acute finding.  MRI brain on 10/15 without acute finding.  Keppra discontinued.  Patient remained encephalopathic.  Palliative medicine involved.  Patient was transferred to hospitalist service on 10/16.  He was transition to full comfort care on  10/17 per discussion and lung palliative medicine, DSS (legal guardian) and stepson.  Anticipate in-hospital death  Subjective: Seen and examined earlier this morning.  No major events overnight or this morning.  Sleepy.  Looks comfortable.   Assessment and plan: End-of-life care/full comfort care-initiated on 10/17 by palliative medicine after discussion with DSS and stepson.  Looks comfortable. -Appreciate help by palliative medicine -Continue comfort meds per palliative. -Anticipate in hospital death  Decompensated liver cirrhosis Acute hepatic encephalopathy Acute metabolic encephalopathy Anion GAP Metabolic Acidosis  Hyperammonemia, improving History of esophageal varices Anemia of critical illness Acute on chronic thrombocytopenia Anasarca   Paroxysmal atrial fibrillation   AKI/acute urinary retention - Continue foley for comfort care.   Sepsis due to pseudomonas bacteremia   Infective endocarditis Chronic diastolic heart failure   Hypothyroidism   Adrenal insufficiency    DVT prophylaxis-full comfort care.   Nutrition/inadequate oral intake Body mass index is 49.6 kg/m. Nutrition Problem: Inadequate oral intake Etiology: inability to eat Signs/Symptoms: NPO status Interventions: Tube feeding  Pressure skin injury: Present on arrival. Wound 09/27/23 1212 Pressure Injury Sacrum Stage 1 -  Intact skin with non-blanchable redness of a localized area usually over a bony prominence. (Active)     Wound 10/10/23 2235 Pressure Injury Heel Right Unstageable - Full thickness tissue loss in which the base of the injury is covered by slough (yellow, tan, gray, green or brown) and/or eschar (tan, brown or black) in the wound bed. (Active)     Wound 10/20/23 Pressure Injury Buttocks Right;Left Stage 3 -  Full thickness tissue loss. Subcutaneous fat may be visible but bone, tendon or muscle are NOT exposed. (Active)    Code Status: DNR-comfort Family Communication: None  at bedside. Level  of care: Med-Surg Status is: Inpatient Remains inpatient appropriate because: End-of-life care   Final disposition: Anticipated in hospital death   35 minutes with more than 50% spent in reviewing records, counseling patient/family and coordinating care.  Consultants:  Neurology Infectious disease Palliative medicine  Procedures: None  Microbiology summarized: 10/12-blood culture with Pseudomonas aeruginosa  Objective: Vitals:   10/27/23 0751 10/28/23 0904 10/28/23 2005 10/29/23 0425  BP: (!) 161/138 (!) 136/98 121/85 122/89  Pulse: 80 (!) 112  (!) 101  Resp: 17 19 18 18   Temp: 98 F (36.7 C) 98.2 F (36.8 C) 98 F (36.7 C) 97.8 F (36.6 C)  TempSrc: Oral Oral    SpO2: 100% 100% 100% 99%  Weight:      Height:        Examination:  GENERAL: No apparent distress.  HEENT: MMM.  Vision and hearing grossly intact.  NECK: Supple.  No apparent JVD.  RESP:  No IWOB.  Fair aeration bilaterally. CVS:  RRR. Heart sounds normal.  ABD/GI/GU: BS+. Abd soft, NTND.  Scrotal edema. MSK/EXT:  Moves  extremities.  Edema/anasarca. SKIN: no apparent skin lesion or wound NEURO: Somnolent.  Wakes to voice.  Oriented to self and hospital.  Does not follow commands. PSYCH: Calm. Normal affect.   Sch Meds:  Scheduled Meds:  collagenase   Topical Daily   Gerhardt's butt cream   Topical TID   hydrocerin   Topical BID   Continuous Infusions:  HYDROmorphone  0.5 mg/hr (10/28/23 2035)   PRN Meds:.acetaminophen  **OR** acetaminophen , antiseptic oral rinse, artificial tears, glycopyrrolate  **OR** glycopyrrolate  **OR** glycopyrrolate , haloperidol **OR** haloperidol **OR** haloperidol lactate, HYDROmorphone , LORazepam  **OR** LORazepam  **OR** LORazepam , ondansetron  **OR** ondansetron  (ZOFRAN ) IV, mouth rinse  Antimicrobials: Anti-infectives (From admission, onward)    Start     Dose/Rate Route Frequency Ordered Stop   10/21/23 1800  ceFEPIme (MAXIPIME) 2 g in sodium  chloride 0.9 % 100 mL IVPB  Status:  Discontinued        2 g 200 mL/hr over 30 Minutes Intravenous Every 8 hours 10/21/23 1626 10/25/23 1053   10/20/23 1815  metroNIDAZOLE (FLAGYL) IVPB 500 mg  Status:  Discontinued        500 mg 100 mL/hr over 60 Minutes Intravenous Every 12 hours 10/20/23 1808 10/21/23 1044   10/20/23 0600  ceFAZolin  (ANCEF ) IVPB 2g/100 mL premix  Status:  Discontinued        2 g 200 mL/hr over 30 Minutes Intravenous Every 8 hours 10/20/23 0040 10/21/23 1626   10/20/23 0045  ceFAZolin  (ANCEF ) IVPB  Status:  Discontinued       Note to Pharmacy: Indication:  MSSA bacteremia First Dose: Yes Last Day of Therapy:  11/12/23 Labs - Once weekly:  CBC/D and CMP Pull PICC line at the completion of IV antibiotic therapy Method of administration: IV Push or per SNF protocol Method of administration may be changed at the discretion of home infusi   2 g Intravenous Every 8 hours 10/20/23 0030 10/20/23 0038        I have personally reviewed the following labs and images: CBC: Recent Labs  Lab 10/23/23 0328 10/24/23 1005 10/25/23 0635  WBC 4.5 4.7 4.5  HGB 10.8* 12.6* 12.5*  HCT 32.4* 38.3* 38.0*  MCV 93.9 96.0 95.7  PLT 26* 21* 17*   BMP &GFR Recent Labs  Lab 10/23/23 0328 10/24/23 1005 10/25/23 0834  NA 137 142 141  K 3.3* 4.5 4.2  CL 110 112* 112*  CO2 18* 19* 17*  GLUCOSE  175* 157* 127*  BUN 40* 40* 44*  CREATININE 1.73* 1.45* 1.27*  CALCIUM 8.2* 8.4* 8.6*  MG 2.0 1.9 1.8  PHOS 2.9 2.5 2.6   Estimated Creatinine Clearance: 98.8 mL/min (A) (by C-G formula based on SCr of 1.27 mg/dL (H)). Liver & Pancreas: Recent Labs  Lab 10/23/23 0328 10/24/23 1005 10/25/23 0834  AST 53* 91* 96*  ALT 5 9 10   ALKPHOS 150* 172* 174*  BILITOT 3.3* 3.4* 3.7*  PROT 5.7* 6.0* 6.0*  ALBUMIN  1.9* 1.8* 1.8*   No results for input(s): LIPASE, AMYLASE in the last 168 hours. Recent Labs  Lab 10/23/23 0616 10/23/23 1015 10/23/23 1405 10/24/23 2124 10/25/23 0635   AMMONIA 28 35 43* 42* 41*   Diabetic: No results for input(s): HGBA1C in the last 72 hours.  Recent Labs  Lab 10/24/23 2022 10/25/23 0028 10/25/23 0413 10/25/23 0921 10/25/23 1137  GLUCAP 136* 133* 117* 103* 106*   Cardiac Enzymes: No results for input(s): CKTOTAL, CKMB, CKMBINDEX, TROPONINI in the last 168 hours. No results for input(s): PROBNP in the last 8760 hours. Coagulation Profile: No results for input(s): INR, PROTIME in the last 168 hours.  Thyroid  Function Tests: No results for input(s): TSH, T4TOTAL, FREET4, T3FREE, THYROIDAB in the last 72 hours. Lipid Profile: No results for input(s): CHOL, HDL, LDLCALC, TRIG, CHOLHDL, LDLDIRECT in the last 72 hours. Anemia Panel: No results for input(s): VITAMINB12, FOLATE, FERRITIN, TIBC, IRON , RETICCTPCT in the last 72 hours. Urine analysis:    Component Value Date/Time   COLORURINE YELLOW 09/26/2023 0844   APPEARANCEUR CLEAR 09/26/2023 0844   APPEARANCEUR Clear 09/29/2018 1020   LABSPEC 1.010 09/26/2023 0844   PHURINE 6.0 09/26/2023 0844   GLUCOSEU NEGATIVE 09/26/2023 0844   HGBUR NEGATIVE 09/26/2023 0844   BILIRUBINUR NEGATIVE 09/26/2023 0844   BILIRUBINUR Negative 09/29/2018 1020   KETONESUR NEGATIVE 09/26/2023 0844   PROTEINUR NEGATIVE 09/26/2023 0844   NITRITE NEGATIVE 09/26/2023 0844   LEUKOCYTESUR NEGATIVE 09/26/2023 0844   Sepsis Labs: Invalid input(s): PROCALCITONIN, LACTICIDVEN  Microbiology: Recent Results (from the past 240 hours)  MRSA Next Gen by PCR, Nasal     Status: Abnormal   Collection Time: 10/20/23  8:59 PM   Specimen: Nasal Mucosa; Nasal Swab  Result Value Ref Range Status   MRSA by PCR Next Gen DETECTED (A) NOT DETECTED Final    Comment: RESULT CALLED TO, READ BACK BY AND VERIFIED WITH: CHRISTELLA PRINGLE RN 10/20/2023 @ 2258 BY AB (NOTE) The GeneXpert MRSA Assay (FDA approved for NASAL specimens only), is one component of a comprehensive  MRSA colonization surveillance program. It is not intended to diagnose MRSA infection nor to guide or monitor treatment for MRSA infections. Test performance is not FDA approved in patients less than 17 years old. Performed at [facility], [address], [place], [place] [address]   Culture, blood (Routine X 2) w Reflex to ID Panel     Status: Abnormal   Collection Time: 10/20/23  9:33 PM   Specimen: BLOOD LEFT HAND  Result Value Ref Range Status   Specimen Description BLOOD LEFT HAND  Final   Special Requests   Final    BOTTLES DRAWN AEROBIC AND ANAEROBIC Blood Culture adequate volume   Culture  Setup Time   Final    GRAM NEGATIVE RODS IN BOTH AEROBIC AND ANAEROBIC BOTTLES CRITICAL RESULT CALLED TO, READ BACK BY AND VERIFIED WITH: MAYA CHARLENA SLADE [id number] @ 1610 FH Performed at [facility], [address],  [place], [place] [address]    Culture PSEUDOMONAS AERUGINOSA (A)  Final   Report Status 10/24/2023 FINAL  Final   Organism ID, Bacteria PSEUDOMONAS AERUGINOSA  Final      Susceptibility   Pseudomonas aeruginosa - MIC*    MEROPENEM 4 INTERMEDIATE Intermediate     CIPROFLOXACIN  0.12 SENSITIVE Sensitive     PIP/TAZO Value in next row Sensitive      8 SENSITIVEThis is a modified FDA-approved test that has been validated and its performance characteristics determined by the reporting laboratory.  This laboratory is certified under the Clinical Laboratory Improvement Amendments CLIA as qualified to perform high complexity clinical laboratory testing.    CEFEPIME Value in next row Sensitive      8 SENSITIVEThis is a modified FDA-approved test that has been validated and its performance characteristics determined by the reporting laboratory.  This laboratory is certified under the Clinical Laboratory Improvement Amendments CLIA as qualified to perform high complexity clinical laboratory testing.    CEFTAZIDIME Value in next row Sensitive      8 SENSITIVEThis is a modified  FDA-approved test that has been validated and its performance characteristics determined by the reporting laboratory.  This laboratory is certified under the Clinical Laboratory Improvement Amendments CLIA as qualified to perform high complexity clinical laboratory testing.    IMIPENEM Value in next row Sensitive      8 SENSITIVEThis is a modified FDA-approved test that has been validated and its performance characteristics determined by the reporting laboratory.  This laboratory is certified under the Clinical Laboratory Improvement Amendments CLIA as qualified to perform high complexity clinical laboratory testing.    CEFTAZIDIME/AVIBACTAM Value in next row Sensitive      8 SENSITIVEThis is a modified FDA-approved test that has been validated and its performance characteristics determined by the reporting laboratory.  This laboratory is certified under the Clinical Laboratory Improvement Amendments CLIA as qualified to perform high complexity clinical laboratory testing.    CEFTOLOZANE/TAZOBACTAM Value in next row Sensitive      8 SENSITIVEThis is a modified FDA-approved test that has been validated and its performance characteristics determined by the reporting laboratory.  This laboratory is certified under the Clinical Laboratory Improvement Amendments CLIA as qualified to perform high complexity clinical laboratory testing.    TOBRAMYCIN Value in next row Sensitive      8 SENSITIVEThis is a modified FDA-approved test that has been validated and its performance characteristics determined by the reporting laboratory.  This laboratory is certified under the Clinical Laboratory Improvement Amendments CLIA as qualified to perform high complexity clinical laboratory testing.    * PSEUDOMONAS AERUGINOSA  Blood Culture ID Panel (Reflexed)     Status: Abnormal   Collection Time: 10/20/23  9:33 PM  Result Value Ref Range Status   Enterococcus faecalis NOT DETECTED NOT DETECTED Final   Enterococcus Faecium  NOT DETECTED NOT DETECTED Final   Listeria monocytogenes NOT DETECTED NOT DETECTED Final   Staphylococcus species NOT DETECTED NOT DETECTED Final   Staphylococcus aureus (BCID) NOT DETECTED NOT DETECTED Final   Staphylococcus epidermidis NOT DETECTED NOT DETECTED Final   Staphylococcus lugdunensis NOT DETECTED NOT DETECTED Final   Streptococcus species NOT DETECTED NOT DETECTED Final   Streptococcus agalactiae NOT DETECTED NOT DETECTED Final   Streptococcus pneumoniae NOT DETECTED NOT DETECTED Final   Streptococcus pyogenes NOT DETECTED NOT DETECTED Final   A.calcoaceticus-baumannii NOT DETECTED NOT DETECTED Final   Bacteroides fragilis NOT DETECTED NOT DETECTED Final   Enterobacterales NOT DETECTED NOT DETECTED  Final   Enterobacter cloacae complex NOT DETECTED NOT DETECTED Final   Escherichia coli NOT DETECTED NOT DETECTED Final   Klebsiella aerogenes NOT DETECTED NOT DETECTED Final   Klebsiella oxytoca NOT DETECTED NOT DETECTED Final   Klebsiella pneumoniae NOT DETECTED NOT DETECTED Final   Proteus species NOT DETECTED NOT DETECTED Final   Salmonella species NOT DETECTED NOT DETECTED Final   Serratia marcescens NOT DETECTED NOT DETECTED Final   Haemophilus influenzae NOT DETECTED NOT DETECTED Final   Neisseria meningitidis NOT DETECTED NOT DETECTED Final   Pseudomonas aeruginosa DETECTED (A) NOT DETECTED Final    Comment: CRITICAL RESULT CALLED TO, READ BACK BY AND VERIFIED WITH: PHARMD ESABRA SLADE [id number] @ 1610 FH    Stenotrophomonas maltophilia NOT DETECTED NOT DETECTED Final   Candida albicans NOT DETECTED NOT DETECTED Final   Candida auris NOT DETECTED NOT DETECTED Final   Candida glabrata NOT DETECTED NOT DETECTED Final   Candida krusei NOT DETECTED NOT DETECTED Final   Candida parapsilosis NOT DETECTED NOT DETECTED Final   Candida tropicalis NOT DETECTED NOT DETECTED Final   Cryptococcus neoformans/gattii NOT DETECTED NOT DETECTED Final   CTX-M ESBL NOT DETECTED NOT  DETECTED Final   Carbapenem resistance IMP NOT DETECTED NOT DETECTED Final   Carbapenem resistance KPC NOT DETECTED NOT DETECTED Final   Carbapenem resistance NDM NOT DETECTED NOT DETECTED Final   Carbapenem resistance VIM NOT DETECTED NOT DETECTED Final    Comment: Performed at [facility], [address] [place], [place] [address]  Culture, blood (Routine X 2) w Reflex to ID Panel     Status: Abnormal   Collection Time: 10/20/23  9:37 PM   Specimen: BLOOD LEFT HAND  Result Value Ref Range Status   Specimen Description BLOOD LEFT HAND  Final   Special Requests   Final    BOTTLES DRAWN AEROBIC AND ANAEROBIC Blood Culture adequate volume   Culture  Setup Time   Final    GRAM NEGATIVE RODS IN BOTH AEROBIC AND ANAEROBIC BOTTLES    Culture (A)  Final    PSEUDOMONAS AERUGINOSA SUSCEPTIBILITIES PERFORMED ON PREVIOUS CULTURE WITHIN THE LAST 5 DAYS. Performed at [facility], [address] [place], [place] [address]    Report Status 10/24/2023 FINAL  Final    Radiology Studies: No results found.    Ayauna Mcnay T. Kalisa Girtman [facility]  If 7PM-7AM, please contact night-coverage [email or web address] 10/29/2023, 4:20 PM

## 2023-10-29 NOTE — Plan of Care (Signed)

## 2023-10-29 NOTE — Progress Notes (Signed)
 Patient awakening to voice, disoriented x3, able to state name but not birthday. Patient approached to turn and position and perform wound care. Patient states no repeatedly. When moving patients' legs, patient cried out. Mobility deferred for patient comfort.

## 2023-10-29 NOTE — Progress Notes (Signed)
 Daily Progress Note   Patient Name: [name]       Date: 10/29/2023 DOB: Sep 06, 1965  Age: 58 y.o. MRN#: [id number] Attending Physician: Kathrin Mignon DASEN, MD Primary Care Physician: Carleen Dallas PARAS, FNP (Inactive) Admit Date: 10/19/2023  Reason for Consultation/Follow-up: Terminal Care  Subjective: I have reviewed medical records including EPIC notes, MAR, available advanced directives, noted legal guardian. Continues on dilaudid  infusion at 0.5mg /hr and received haldol x1 and ativan  x1 during night shift. Received report from primary RN - no acute concerns. Per RN patient has been resting well this morning.  Went to visit patient at bedside - no family/visitors present. Patient was lying in bed asleep. No signs or non-verbal gestures of pain or discomfort noted. No respiratory distress, increased work of breathing, or secretions noted.    Length of Stay: 9  Current Medications: Scheduled Meds:   collagenase   Topical Daily   Gerhardt's butt cream   Topical TID   hydrocerin   Topical BID    Continuous Infusions:  HYDROmorphone  0.5 mg/hr (10/28/23 2035)    PRN Meds: acetaminophen  **OR** acetaminophen , antiseptic oral rinse, artificial tears, glycopyrrolate  **OR** glycopyrrolate  **OR** glycopyrrolate , haloperidol **OR** haloperidol **OR** haloperidol lactate, HYDROmorphone , LORazepam  **OR** LORazepam  **OR** LORazepam , ondansetron  **OR** ondansetron  (ZOFRAN ) IV, mouth rinse  Physical Exam Vitals and nursing note reviewed.  Constitutional:      General: He is not in acute distress. Pulmonary:     Effort: No tachypnea or respiratory distress.  Skin:    General: Skin is warm and dry.             Vital Signs: BP 122/89 (BP Location: Left Wrist)   Pulse (!) 101   Temp 97.8  F (36.6 C)   Resp 18   Ht 6' (1.829 m)   Wt (!) 165.9 kg   SpO2 99%   BMI 49.60 kg/m  SpO2: SpO2: 99 % O2 Device: O2 Device: Room Air O2 Flow Rate: O2 Flow Rate (L/min): 2 L/min  Intake/output summary:  Intake/Output Summary (Last 24 hours) at 10/29/2023 0843 Last data filed at 10/29/2023 0426 Gross per 24 hour  Intake --  Output 1000 ml  Net -1000 ml   LBM: Last BM Date : 10/28/23 Baseline Weight: Weight: (!) 161 kg Most recent weight: Weight: (!) 165.9 kg  Palliative Assessment/Data: PPS 20%      Patient Active Problem List   Diagnosis Date Noted   Acute hepatic encephalopathy (HCC) 10/20/2023   Alcoholic cirrhosis (HCC) 10/20/2023   Coagulopathy 10/20/2023   Infective endocarditis 10/20/2023   Adrenal insufficiency 10/20/2023   Pressure injury of right ankle, stage 4 (HCC) 10/20/2023   Acute kidney injury 10/11/2023   Prolonged QT interval 10/11/2023   Hyperammonemia 10/11/2023   Acute endocarditis 10/11/2023   Hypothyroidism 10/11/2023   GERD (gastroesophageal reflux disease) 10/11/2023   AKI (acute kidney injury) 10/11/2023   Staphylococcus aureus bacteremia 10/01/2023   Gram-positive bacteremia 09/30/2023   Paroxysmal atrial fibrillation (HCC) 09/29/2023   Hepatic encephalopathy (HCC) 09/29/2023   Acute metabolic encephalopathy 09/25/2023   Hyponatremia 04/04/2021   Esophageal varices (HCC) 04/28/2020   History of colonic polyps 04/28/2020   Alcohol abuse 04/28/2020   Acute shoulder pain due to trauma, right 10/12/2019   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 10/12/2019   Localized osteoarthritis of wrist 10/12/2019   Nontraumatic complete tear of right rotator cuff 10/12/2019   Cocaine abuse (HCC) 09/09/2019   1st degree AV block 08/17/2019   Lyme disease 08/17/2019   Fever of unknown origin 08/16/2019   Elevated AST (SGOT) 08/04/2019   Dyslipidemia 07/14/2019   Iron  deficiency anemia 07/08/2019   Migraine without aura  05/18/2019   Acute on chronic diastolic CHF (congestive heart failure) (HCC) 05/14/2019   Scrotal edema 04/22/2019   Bilateral hydrocele 04/22/2019   Pancytopenia (HCC)    Long-term current use of opiate analgesic 03/21/2019   Seizure-like activity (HCC) 03/21/2019   Meralgia paresthetica of left side 03/21/2019   Polyarthropathy 03/21/2019   Arm mass, right 07/31/2017   Bilateral primary osteoarthritis of knee 04/04/2017   Depression, recurrent 07/18/2016   Encounter for screening for other viral diseases 06/20/2016   Mixed hyperlipidemia 10/31/2015   Leukopenia 07/08/2015   Abdominal wall bulge 03/02/2015   Portal hypertension (HCC) 08/24/2014   Hypokalemia 08/24/2014   COPD, severity to be determined (HCC) 08/24/2014   Biliary colic 07/14/2014   CHF (congestive heart failure) (HCC) 07/14/2014   H/O ETOH abuse 07/14/2014   Thrombocytopenia 07/30/2013   Chronic pain disorder 07/30/2013   Edema 07/30/2013   Genu varus    Hypogonadism male 05/05/2012   Umbilical hernia without obstruction and without gangrene 12/26/2011   Asthma 08/28/2011   Cirrhosis (HCC) 08/14/2011   UNSPECIFIED TACHYCARDIA 02/01/2010   Gout 05/22/2008   Hypertension, essential 05/22/2008   Atrial fibrillation, chronic (HCC) 05/22/2008   DIASTOLIC HEART FAILURE, CHRONIC 05/22/2008   Acute on chronic diastolic heart failure (HCC) 05/22/2008   Morbid obesity with BMI of 50.0-59.9, adult (HCC) 02/21/2007   Coronary atherosclerosis 02/21/2007   Obstructive sleep apnea of adult 02/04/2007    Palliative Care Assessment & Plan   Patient Profile: 58 y.o. male  with past medical history of EtOH cirrhosis, portal vein thrombosis, thrombocytopenia, esophageal varices with recent admission for hepatic encephalopathy, MSSA bacteremia with infective endocarditis on cefazolin  therapy, adrenal insufficiency who presented with altered mental status.  He was admitted on 10/19/2023 with altered mental status, hepatic  encephalopathy, sepsis due to Pseudomonas bacteremia, history of MSSA endocarditis, adrenal insufficiency, AKI.    Palliative medicine was consulted for GOC conversations and attempts to facilitate decision making between medical team and emergency legal guardian.  Assessment: Principal Problem:   Acute hepatic encephalopathy (HCC) Active Problems:   Thrombocytopenia   Seizure-like activity (HCC)   Paroxysmal atrial fibrillation (  HCC)   Hypothyroidism   AKI (acute kidney injury)   Alcoholic cirrhosis (HCC)   Coagulopathy   Infective endocarditis   Adrenal insufficiency   Pressure injury of right ankle, stage 4 (HCC)   Terminal care  Recommendations/Plan: Continue full comfort measures Continue DNR/DNI as previously documented Anticipate hospital death Continue current comfort focused medication regimen as noted below - no changes PMT will continue to follow and support holistically  Symptom Management Continuous dilaudid  infusion; bolus doses for breakthrough pain/dyspnea/increased work of breathing/RR>25 Tylenol  PRN pain/fever Biotin PRN dry mouth Robinul  PRN secretions Haldol PRN agitation/delirium Ativan  PRN anxiety/seizure/sleep/distress Zofran  PRN nausea/vomiting Liquifilm Tears PRN dry eye   Goals of Care and Additional Recommendations: Limitations on Scope of Treatment: Full Comfort Care  Code Status:    Code Status Orders  (From admission, onward)           Start     Ordered   10/25/23 1055  Do not attempt resuscitation (DNR) - Comfort care  Continuous       Question Answer Comment  If patient has no pulse and is not breathing Do Not Attempt Resuscitation   In Pre-Arrest Conditions (Patient Is Breathing and Has a Pulse) Provide comfort measures. Relieve any mechanical airway obstruction. Avoid transfer unless required for comfort.   Consent: Discussion documented in EHR or advanced directives reviewed      10/25/23 1056           Code Status  History     Date Active Date Inactive Code Status Order ID Comments User Context   10/22/2023 1751 10/25/2023 1056 Limited: Do not attempt resuscitation (DNR) -DNR-LIMITED -Do Not Intubate/DNI  [id number]  Theophilus Roosevelt, MD Inpatient   10/20/2023 0030 10/22/2023 1751 Full Code [id number]  Kenard Zachary PARAS, MD ED   10/20/2023 0003 10/20/2023 0030 Full Code [id number]  Kenard Zachary PARAS, MD ED   10/11/2023 0209 10/16/2023 2052 Full Code [id number]  Manfred Driver, DO Inpatient   09/25/2023 1737 10/05/2023 1857 Full Code [id number]  Pearlean Tully BRAVO, MD ED   05/14/2019 0951 05/18/2019 1554 Full Code [id number]  Jonel Lonni SQUIBB, MD Inpatient   04/22/2019 0546 04/25/2019 1723 Full Code [id number]  Mansy, Madison LABOR, MD ED       Prognosis:  Days-week  Discharge Planning: Anticipated Hospital Death  Care plan was discussed with primary RN  Thank you for allowing the Palliative Medicine Team to assist in the care of this patient.     Jeoffrey CHRISTELLA Sharps, NP  Please contact Palliative Medicine Team phone at [phone] for questions and concerns.   *Portions of this note are a verbal dictation therefore any spelling and/or grammatical errors are due to the Dragon Medical One system interpretation.

## 2023-10-30 DIAGNOSIS — K7682 Hepatic encephalopathy: Secondary | ICD-10-CM | POA: Diagnosis not present

## 2023-10-30 DIAGNOSIS — I330 Acute and subacute infective endocarditis: Secondary | ICD-10-CM | POA: Diagnosis not present

## 2023-10-30 DIAGNOSIS — Z515 Encounter for palliative care: Secondary | ICD-10-CM | POA: Diagnosis not present

## 2023-10-30 NOTE — Progress Notes (Signed)
 Daily Progress Note   Patient Name: [name]       Date: 10/30/2023 DOB: October 23, 1965  Age: 58 y.o. MRN#: [id number] Attending Physician: Al-Sultani, Anmar, MD Primary Care Physician: Carleen Dallas PARAS, FNP (Inactive) Admit Date: 10/19/2023  Reason for Consultation/Follow-up: Terminal Care  Subjective: Medical records reviewed including progress notes, labs, imaging. Patient assessed at the bedside.  Breathing comfortably, will arouse and mumble responses. Per MAR, received 1 PRN dose of Dilaudid  in the past 24 hours.  Questions and concerns addressed. PMT will continue to support holistically.    Length of Stay: 10   Physical Exam Vitals and nursing note reviewed.  Constitutional:      General: He is not in acute distress.    Appearance: He is ill-appearing.  HENT:     Head: Normocephalic and atraumatic.  Cardiovascular:     Rate and Rhythm: Normal rate.  Pulmonary:     Effort: No tachypnea or respiratory distress.  Skin:    General: Skin is warm and dry.  Neurological:     Mental Status: He is lethargic.            Vital Signs: BP 110/68 (BP Location: Left Arm)   Pulse 100   Temp 97.7 F (36.5 C) (Axillary)   Resp 14   Ht 6' (1.829 m)   Wt (!) 165.9 kg   SpO2 100%   BMI 49.60 kg/m  SpO2: SpO2: 100 % O2 Device: O2 Device: Room Air O2 Flow Rate: O2 Flow Rate (L/min): 2 L/min       Palliative Assessment/Data: PPS 20%    Palliative Care Assessment & Plan   Patient Profile: 58 y.o. male  with past medical history of EtOH cirrhosis, portal vein thrombosis, thrombocytopenia, esophageal varices with recent admission for hepatic encephalopathy, MSSA bacteremia with infective endocarditis on cefazolin  therapy, adrenal insufficiency who presented with altered  mental status.  He was admitted on 10/19/2023 with altered mental status, hepatic encephalopathy, sepsis due to Pseudomonas bacteremia, history of MSSA endocarditis, adrenal insufficiency, AKI.    Palliative medicine was consulted for GOC conversations and attempts to facilitate decision making between medical team and emergency legal guardian.  Assessment: Principal Problem:   Acute hepatic encephalopathy (HCC) Active Problems:   Thrombocytopenia   Seizure-like activity (HCC)   Paroxysmal atrial fibrillation (HCC)  Hypothyroidism   AKI (acute kidney injury)   Alcoholic cirrhosis (HCC)   Coagulopathy   Infective endocarditis   Adrenal insufficiency   Pressure injury of right ankle, stage 4 (HCC)   Terminal care  Recommendations/Plan: Continue DNR/DNI  Continue comfort focused care, no adjustments required today PMT will continue to follow and support holistically  Symptom Management Continuous dilaudid  infusion; bolus doses for breakthrough pain/dyspnea/increased work of breathing/RR>25 Tylenol  PRN pain/fever Biotin PRN dry mouth Robinul  PRN secretions Haldol PRN agitation/delirium Ativan  PRN anxiety/seizure/sleep/distress Zofran  PRN nausea/vomiting Liquifilm Tears PRN dry eye  Prognosis:  Days-week  Discharge Planning: Anticipated Hospital Death   Mickle Fell, [facility] Palliative Medicine Team Team phone # [phone]  Thank you for allowing the Palliative Medicine Team to assist in the care of this patient. Please utilize secure chat with additional questions, if there is no response within 30 minutes please call the above phone number.  Palliative Medicine Team providers are available by phone from 7am to 7pm daily and can be reached through the team cell phone.  Should this patient require assistance outside of these hours, please call the patient's attending physician.  Portions of this note are a verbal dictation therefore any spelling and/or grammatical  errors are due to the Dragon Medical One system interpretation.   Time Total: 25  Visit consisted of counseling and education dealing with the complex and emotionally intense issues of symptom management and palliative care in the setting of serious and potentially life-threatening illness. Greater than 50% of this time was spent counseling and coordinating care related to the above assessment and plan.  Personally spent 25 minutes in patient care including extensive chart review (labs, imaging, progress/consult notes, vital signs), medically appropraite exam, discussed with treatment team, education to patient, family, and staff, documenting clinical information, medication review and management, coordination of care, and available advanced directive documents.

## 2023-10-30 NOTE — Plan of Care (Signed)
  Problem: Pain Managment: Goal: General experience of comfort will improve and/or be controlled Outcome: Progressing   Problem: Safety: Goal: Ability to remain free from injury will improve Outcome: Progressing   Problem: Pain Management: Goal: Satisfaction with pain management regimen will improve Outcome: Progressing   Problem: Nutritional: Goal: Maintenance of adequate nutrition will improve Outcome: Not Progressing   Problem: Skin Integrity: Goal: Risk for impaired skin integrity will decrease Outcome: Not Progressing   Problem: Role Relationship: Goal: Ability to verbalize concerns, feelings, and thoughts to partner or family member will improve Outcome: Not Progressing   Problem: Clinical Measurements: Goal: Quality of life will improve Outcome: Not Applicable

## 2023-10-30 NOTE — Progress Notes (Signed)
 Progress Note   Patient: [name] FMW:[id number] DOB: 1965-01-31 DOA: 10/19/2023     10 DOS: the patient was seen and examined on 10/30/2023   Brief hospital course: 58 year old M with PMH of cirrhosis with mesenteric/portal vein thrombosis, esophageal varices, thrombocytopenia, hepatic encephalopathy, HFpEF, A-fib not on AC, adrenal insufficiency, hypertension MSSA bacteremia with presumed infective endocarditis on IV Ancef  and recent hospitalization from 10/2-10/8 at [facility] with acute hepatic encephalopathy and AKI return to [facility] with increased confusion and right gaze preference.  Ammonia level was elevated to 247.  VBG without significant finding.  Teleneurology consulted.  There was concern about seizure/CVA but patient was not a candidate for IV thrombolytics given esophageal varices and thrombocytopenia.  CT head and CT angio head and neck without acute finding but concern for bilateral bronchopneumonia.  He was loaded with Keppra and transferred to [facility] for neuro workup and EEG monitoring.  Further workup revealed Pseudomonas bacteremia.  Antibiotic escalated to IV cefepime after discussion with ID.   Patient has had worsening encephalopathy despite improvement in his ammonia level.  LTM was reviewed by neurology concerned, originally showing diffuse slowing but around noon patient developed burst suppression pattern but on no burst suppression therapy.  Neurology recommended stat CT of head due to severe low platelet count of 29,000.  PCCM was consulted and he was moved to ICU on 10/12.  Repeat CT head without acute finding.  MRI brain on 10/15 without acute finding.  Keppra discontinued.  Patient remained encephalopathic.  Palliative medicine involved.  Patient was transferred to hospitalist service on 10/16.  He was transition to full comfort care on 10/17 per discussion and lung palliative medicine, DSS (legal guardian) and stepson.  May end up being an  in-hospital death.  Assessment and Plan:  End-of-life care/full comfort care-initiated on 10/17 by palliative medicine after discussion with DSS and stepson.  - Appreciate help by palliative medicine - Continue comfort meds per palliative. - Anticipate in hospital death - Appears comfortable at this time  Decompensated liver cirrhosis Acute hepatic encephalopathy Acute metabolic encephalopathy Anion GAP Metabolic Acidosis  Hyperammonemia, improving History of esophageal varices Anemia of critical illness Acute on chronic thrombocytopenia Anasarca   Paroxysmal atrial fibrillation   AKI/acute urinary retention - Continue foley for comfort care.   Sepsis due to pseudomonas bacteremia   Infective endocarditis Chronic diastolic heart failure   Hypothyroidism   Adrenal insufficiency     Subjective: Patient seen at bedside this morning. No acute events overnight. Asleep but arouses to voice. Mumbles incoherent responses and returns to sleep.  No acute concerns.  Physical Exam: Vitals:   10/29/23 1230 10/29/23 1930 10/30/23 0934 10/30/23 2322  BP: 106/65 110/68 (!) 123/111 111/80  Pulse: (!) 107 100 92 88  Resp: 16 14 19    Temp: 97.6 F (36.4 C) 97.7 F (36.5 C)    TempSrc: Oral Axillary    SpO2: 100%  99% 100%  Weight:      Height:       Physical Exam Constitutional:      General: He is not in acute distress.    Appearance: He is obese. He is ill-appearing.  Cardiovascular:     Rate and Rhythm: Normal rate and regular rhythm.     Heart sounds: Normal heart sounds. No murmur heard. Pulmonary:     Effort: Pulmonary effort is normal. No respiratory distress.  Abdominal:     General: There is no distension.     Palpations:  Abdomen is soft.     Tenderness: There is no abdominal tenderness.  Skin:    General: Skin is warm and dry.     Capillary Refill: Capillary refill takes less than 2 seconds.  Neurological:     Mental Status: Mental status is at baseline. He  is lethargic.     Comments: Mostly asleep. Arouses to voice, mumbles incoherently, returns to sleep.      Family Communication: None  Disposition: Status is: Inpatient   Planned Discharge Destination: Anticipated hospital death    Time spent: 25 minutes  Author: Duffy Larch, MD 10/30/2023 11:43 PM  For on call review [email or web address].

## 2023-10-30 NOTE — Plan of Care (Signed)
  Problem: Coping: Goal: Level of anxiety will decrease Outcome: Progressing   Problem: Safety: Goal: Ability to remain free from injury will improve Outcome: Progressing   Problem: Skin Integrity: Goal: Risk for impaired skin integrity will decrease Outcome: Progressing   Problem: Skin Integrity: Goal: Risk for impaired skin integrity will decrease Outcome: Progressing   Problem: Pain Management: Goal: Satisfaction with pain management regimen will improve Outcome: Progressing   Problem: Education: Goal: Knowledge of General Education information will improve Description: Including pain rating scale, medication(s)/side effects and non-pharmacologic comfort measures Outcome: Not Progressing   Problem: Health Behavior/Discharge Planning: Goal: Ability to manage health-related needs will improve Outcome: Not Progressing   Problem: Clinical Measurements: Goal: Ability to maintain clinical measurements within normal limits will improve Outcome: Not Progressing Goal: Will remain free from infection Outcome: Not Progressing Goal: Diagnostic test results will improve Outcome: Not Progressing Goal: Respiratory complications will improve Outcome: Not Progressing Goal: Cardiovascular complication will be avoided Outcome: Not Progressing   Problem: Activity: Goal: Risk for activity intolerance will decrease Outcome: Not Progressing   Problem: Nutrition: Goal: Adequate nutrition will be maintained Outcome: Not Progressing   Problem: Elimination: Goal: Will not experience complications related to bowel motility Outcome: Not Progressing Goal: Will not experience complications related to urinary retention Outcome: Not Progressing   Problem: Pain Managment: Goal: General experience of comfort will improve and/or be controlled Outcome: Not Progressing   Problem: Education: Goal: Ability to describe self-care measures that may prevent or decrease complications (Diabetes  Survival Skills Education) will improve Outcome: Not Progressing Goal: Individualized Educational Video(s) Outcome: Not Progressing   Problem: Coping: Goal: Ability to adjust to condition or change in health will improve Outcome: Not Progressing   Problem: Fluid Volume: Goal: Ability to maintain a balanced intake and output will improve Outcome: Not Progressing   Problem: Health Behavior/Discharge Planning: Goal: Ability to identify and utilize available resources and services will improve Outcome: Not Progressing Goal: Ability to manage health-related needs will improve Outcome: Not Progressing   Problem: Metabolic: Goal: Ability to maintain appropriate glucose levels will improve Outcome: Not Progressing   Problem: Nutritional: Goal: Maintenance of adequate nutrition will improve Outcome: Not Progressing Goal: Progress toward achieving an optimal weight will improve Outcome: Not Progressing   Problem: Tissue Perfusion: Goal: Adequacy of tissue perfusion will improve Outcome: Not Progressing   Problem: Education: Goal: Knowledge of the prescribed therapeutic regimen will improve Outcome: Not Progressing   Problem: Coping: Goal: Ability to identify and develop effective coping behavior will improve Outcome: Not Progressing   Problem: Clinical Measurements: Goal: Quality of life will improve Outcome: Not Progressing   Problem: Respiratory: Goal: Verbalizations of increased ease of respirations will increase Outcome: Not Progressing   Problem: Role Relationship: Goal: Family's ability to cope with current situation will improve Outcome: Not Progressing Goal: Ability to verbalize concerns, feelings, and thoughts to partner or family member will improve Outcome: Not Progressing

## 2023-10-30 NOTE — Hospital Course (Addendum)
 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 He was recently hospitalized (10/2-10/8) for acute hepatic encephalopathy and AKI.  The patient presented to [facility] ED with increasing confusion and right gaze preference. Ammonia was elevated to 247 mol/L. CT head and CTA head/neck showed no acute intracranial abnormality but findings concerning for bilateral bronchopneumonia. He was loaded with levetiracetam and transferred to [facility] for further neurologic evaluation and EEG monitoring.  Continuous EEG initially showed diffuse slowing, later evolving to a burst-suppression pattern despite the absence of sedating medications. Given severe thrombocytopenia (platelets 29 K), neurology recommended a stat CT head, which again showed no acute abnormality. PCCM was consulted, and the patient was transferred to the ICU on 10/12 for close monitoring.  Despite improvement in ammonia levels, the patient's encephalopathy worsened. Blood cultures grew Pseudomonas aeruginosa bacteremia, and antibiotics were escalated to IV cefepime after discussion with Infectious Disease. MRI brain on 10/15 showed no acute findings. Levetiracetam was subsequently discontinued.  He was transferred back to the hospitalist service on 10/16. Over the following day, his thrombocytopenia worsened (platelets 17 K), and liver enzymes continued to rise. The patient remained profoundly encephalopathic with poor prognosis.  Palliative Medicine was consulted, and discussions were held with the Department of Social Services (legal guardian) and the patient's stepson, [name]. The decision was made to transition to full comfort measures on  10/17. Comfort orders were implemented.  On 11/27/2023 at 13:58, the patient was pronounced deceased. Family was present at bedside at the time of death.

## 2023-10-31 ENCOUNTER — Telehealth: Admitting: Infectious Diseases

## 2023-10-31 DIAGNOSIS — I330 Acute and subacute infective endocarditis: Secondary | ICD-10-CM | POA: Diagnosis not present

## 2023-10-31 DIAGNOSIS — K7682 Hepatic encephalopathy: Secondary | ICD-10-CM | POA: Diagnosis not present

## 2023-10-31 DIAGNOSIS — Z515 Encounter for palliative care: Secondary | ICD-10-CM | POA: Diagnosis not present

## 2023-10-31 NOTE — Progress Notes (Signed)
 Progress Note   Patient: [name] FMW:[id number] DOB: 12/04/1965 DOA: 10/19/2023     11 DOS: the patient was seen and examined on 10/31/2023   Brief hospital course: 58 year old M with PMH of cirrhosis with mesenteric/portal vein thrombosis, esophageal varices, thrombocytopenia, hepatic encephalopathy, HFpEF, A-fib not on AC, adrenal insufficiency, hypertension MSSA bacteremia with presumed infective endocarditis on IV Ancef  and recent hospitalization from 10/2-10/8 at [facility] with acute hepatic encephalopathy and AKI return to [facility] with increased confusion and right gaze preference.  Ammonia level was elevated to 247.  VBG without significant finding.  Teleneurology consulted.  There was concern about seizure/CVA but patient was not a candidate for IV thrombolytics given esophageal varices and thrombocytopenia.  CT head and CT angio head and neck without acute finding but concern for bilateral bronchopneumonia.  He was loaded with Keppra and transferred to [facility] for neuro workup and EEG monitoring.  Further workup revealed Pseudomonas bacteremia.  Antibiotic escalated to IV cefepime after discussion with ID.   Patient has had worsening encephalopathy despite improvement in his ammonia level.  LTM was reviewed by neurology concerned, originally showing diffuse slowing but around noon patient developed burst suppression pattern but on no burst suppression therapy.  Neurology recommended stat CT of head due to severe low platelet count of 29,000.  PCCM was consulted and he was moved to ICU on 10/12.  Repeat CT head without acute finding.  MRI brain on 10/15 without acute finding.  Keppra discontinued.  Patient remained encephalopathic.  Palliative medicine involved.  Patient was transferred to hospitalist service on 10/16.  He was transition to full comfort care on 10/17 per discussion and lung palliative medicine, DSS (legal guardian) and stepson.  May end up being an  in-hospital death.  Assessment and Plan:  End-of-life care/full comfort care-initiated on 10/17 by palliative medicine after discussion with DSS and stepson.  - Appreciate help by palliative medicine - Continue comfort meds per palliative. - Anticipate in hospital death - Appears comfortable at this time  Decompensated liver cirrhosis Acute hepatic encephalopathy Acute metabolic encephalopathy Anion GAP Metabolic Acidosis  Hyperammonemia, improving History of esophageal varices Anemia of critical illness Acute on chronic thrombocytopenia Anasarca   Paroxysmal atrial fibrillation   AKI/acute urinary retention - Continue foley for comfort care.   Sepsis due to pseudomonas bacteremia   Infective endocarditis Chronic diastolic heart failure   Hypothyroidism   Adrenal insufficiency     Subjective: Patient seen at bedside this morning. No acute events overnight. Asleep but arouses to voice. Mumbles mostly incoherent responses and returns to sleep, but is able to provide his name.  No acute concerns.  Physical Exam: Vitals:   10/29/23 1930 10/30/23 0934 10/30/23 2322 10/31/23 1021  BP: 110/68 (!) 123/111 111/80 112/77  Pulse: 100 92 88 82  Resp: 14 19 18 17   Temp: 97.7 F (36.5 C)     TempSrc: Axillary     SpO2:  99% 100% 100%  Weight:      Height:       Physical Exam Constitutional:      General: He is not in acute distress.    Appearance: He is obese. He is ill-appearing.  Cardiovascular:     Rate and Rhythm: Normal rate and regular rhythm.     Heart sounds: Normal heart sounds. No murmur heard. Pulmonary:     Effort: Pulmonary effort is normal. No respiratory distress.  Abdominal:     General: There is no distension.  Palpations: Abdomen is soft.     Tenderness: There is no abdominal tenderness.  Skin:    General: Skin is warm and dry.     Capillary Refill: Capillary refill takes less than 2 seconds.  Neurological:     Mental Status: Mental status is  at baseline. He is lethargic.     Comments: Mostly asleep. Arouses to voice, mumbles incoherently, returns to sleep.      Family Communication: None  Disposition: Status is: Inpatient   Planned Discharge Destination: Anticipated hospital death    Time spent: 22 minutes  Author: Duffy Larch, MD 10/31/2023 7:38 PM  For on call review [email or web address].

## 2023-10-31 NOTE — Plan of Care (Signed)
  Problem: Coping: Goal: Level of anxiety will decrease Outcome: Progressing   Problem: Elimination: Goal: Will not experience complications related to bowel motility Outcome: Progressing   Problem: Pain Managment: Goal: General experience of comfort will improve and/or be controlled Outcome: Progressing   Problem: Safety: Goal: Ability to remain free from injury will improve Outcome: Progressing   Problem: Education: Goal: Knowledge of General Education information will improve Description: Including pain rating scale, medication(s)/side effects and non-pharmacologic comfort measures Outcome: Not Progressing   Problem: Health Behavior/Discharge Planning: Goal: Ability to manage health-related needs will improve Outcome: Not Progressing   Problem: Clinical Measurements: Goal: Ability to maintain clinical measurements within normal limits will improve Outcome: Not Progressing Goal: Will remain free from infection Outcome: Not Progressing Goal: Diagnostic test results will improve Outcome: Not Progressing Goal: Respiratory complications will improve Outcome: Not Progressing Goal: Cardiovascular complication will be avoided Outcome: Not Progressing   Problem: Activity: Goal: Risk for activity intolerance will decrease Outcome: Not Progressing   Problem: Nutrition: Goal: Adequate nutrition will be maintained Outcome: Not Progressing   Problem: Elimination: Goal: Will not experience complications related to urinary retention Outcome: Not Progressing   Problem: Skin Integrity: Goal: Risk for impaired skin integrity will decrease Outcome: Not Progressing   Problem: Education: Goal: Ability to describe self-care measures that may prevent or decrease complications (Diabetes Survival Skills Education) will improve Outcome: Not Progressing Goal: Individualized Educational Video(s) Outcome: Not Progressing   Problem: Coping: Goal: Ability to adjust to condition or  change in health will improve Outcome: Not Progressing   Problem: Fluid Volume: Goal: Ability to maintain a balanced intake and output will improve Outcome: Not Progressing

## 2023-10-31 NOTE — Progress Notes (Signed)
 Daily Progress Note   Patient Name: [name]       Date: 10/31/2023 DOB: 1965-01-18  Age: 58 y.o. MRN#: [id number] Attending Physician: Al-Sultani, Anmar, MD Primary Care Physician: Carleen Dallas PARAS, FNP (Inactive) Admit Date: 10/19/2023  Reason for Consultation/Follow-up: Terminal Care  Subjective: Medical records reviewed including progress notes, labs, imaging. Patient assessed at the bedside.  Breathing comfortably, will arouse and mumble responses. Denies distress, some tremors of right arm do not seem to bother him. Per MAR, received 1 PRN dose of Dilaudid  in the past 24 hours.  Questions and concerns addressed. PMT will continue to support holistically.    Length of Stay: 11   Physical Exam Vitals and nursing note reviewed.  Constitutional:      General: He is not in acute distress.    Appearance: He is ill-appearing.  HENT:     Head: Normocephalic and atraumatic.  Cardiovascular:     Rate and Rhythm: Normal rate.  Pulmonary:     Effort: No tachypnea or respiratory distress.  Skin:    General: Skin is warm and dry.  Neurological:     Mental Status: He is lethargic.            Vital Signs: BP 112/77 (BP Location: Left Arm)   Pulse 82   Temp 97.7 F (36.5 C) (Axillary)   Resp 17   Ht 6' (1.829 m)   Wt (!) 165.9 kg   SpO2 100%   BMI 49.60 kg/m  SpO2: SpO2: 100 % O2 Device: O2 Device: Room Air O2 Flow Rate: O2 Flow Rate (L/min): 2 L/min       Palliative Assessment/Data: PPS 20%    Palliative Care Assessment & Plan   Patient Profile: 58 y.o. male  with past medical history of EtOH cirrhosis, portal vein thrombosis, thrombocytopenia, esophageal varices with recent admission for hepatic encephalopathy, MSSA bacteremia with infective endocarditis on  cefazolin  therapy, adrenal insufficiency who presented with altered mental status.  He was admitted on 10/19/2023 with altered mental status, hepatic encephalopathy, sepsis due to Pseudomonas bacteremia, history of MSSA endocarditis, adrenal insufficiency, AKI.    Palliative medicine was consulted for GOC conversations and attempts to facilitate decision making between medical team and emergency legal guardian.  Assessment: Principal Problem:   Acute hepatic encephalopathy (HCC) Active Problems:   Thrombocytopenia  Seizure-like activity (HCC)   Paroxysmal atrial fibrillation (HCC)   Hypothyroidism   AKI (acute kidney injury)   Alcoholic cirrhosis (HCC)   Coagulopathy   Infective endocarditis   Adrenal insufficiency   Pressure injury of right ankle, stage 4 (HCC)   Terminal care  Recommendations/Plan: Continue DNR/DNI  Continue comfort focused care, no adjustments required today PMT will continue to follow and support holistically  Symptom Management Continuous dilaudid  infusion; bolus doses for breakthrough pain/dyspnea/increased work of breathing/RR>25 Tylenol  PRN pain/fever Biotin PRN dry mouth Robinul  PRN secretions Haldol PRN agitation/delirium Ativan  PRN anxiety/seizure/sleep/distress Zofran  PRN nausea/vomiting Liquifilm Tears PRN dry eye  Prognosis:  Days-week  Discharge Planning: Anticipated Hospital Death   Mickle Fell, [facility] Palliative Medicine Team Team phone # [phone]  Thank you for allowing the Palliative Medicine Team to assist in the care of this patient. Please utilize secure chat with additional questions, if there is no response within 30 minutes please call the above phone number.  Palliative Medicine Team providers are available by phone from 7am to 7pm daily and can be reached through the team cell phone.  Should this patient require assistance outside of these hours, please call the patient's attending physician.  Portions of this note  are a verbal dictation therefore any spelling and/or grammatical errors are due to the Dragon [employer] One system interpretation.   Time Total: 25  Visit consisted of counseling and education dealing with the complex and emotionally intense issues of symptom management and palliative care in the setting of serious and potentially life-threatening illness. Greater than 50% of this time was spent counseling and coordinating care related to the above assessment and plan.  Personally spent 25 minutes in patient care including extensive chart review (labs, imaging, progress/consult notes, vital signs), medically appropraite exam, discussed with treatment team, education to patient, family, and staff, documenting clinical information, medication review and management, coordination of care, and available advanced directive documents.

## 2023-10-31 NOTE — Plan of Care (Signed)
   Problem: Elimination: Goal: Will not experience complications related to bowel motility Outcome: Progressing   Problem: Pain Managment: Goal: General experience of comfort will improve and/or be controlled Outcome: Progressing   Problem: Safety: Goal: Ability to remain free from injury will improve Outcome: Progressing

## 2023-11-01 DIAGNOSIS — I330 Acute and subacute infective endocarditis: Secondary | ICD-10-CM | POA: Diagnosis not present

## 2023-11-01 DIAGNOSIS — Z515 Encounter for palliative care: Secondary | ICD-10-CM | POA: Diagnosis not present

## 2023-11-01 DIAGNOSIS — H510 Palsy (spasm) of conjugate gaze: Secondary | ICD-10-CM | POA: Diagnosis not present

## 2023-11-01 DIAGNOSIS — R52 Pain, unspecified: Secondary | ICD-10-CM

## 2023-11-01 DIAGNOSIS — K7682 Hepatic encephalopathy: Secondary | ICD-10-CM | POA: Diagnosis not present

## 2023-11-01 DIAGNOSIS — Z66 Do not resuscitate: Secondary | ICD-10-CM | POA: Diagnosis not present

## 2023-11-01 MED ORDER — HYDROMORPHONE HCL-NACL 50-0.9 MG/50ML-% IV SOLN
0.5000 mg/h | INTRAVENOUS | Status: DC
Start: 2023-11-02 — End: 2023-11-01

## 2023-11-01 MED ORDER — SODIUM CHLORIDE 0.9 % IV SOLN
0.5000 mg/h | INTRAVENOUS | Status: DC
  Administered 2023-11-02: 0.5 mg/h via INTRAVENOUS
  Filled 2023-11-01: qty 5

## 2023-11-01 NOTE — Progress Notes (Signed)
 Progress Note   Patient: [name] FMW:[id number] DOB: Jan 27, 1965 DOA: 10/19/2023     12 DOS: the patient was seen and examined on 11/01/2023   Brief hospital course: 58 year old M with PMH of cirrhosis with mesenteric/portal vein thrombosis, esophageal varices, thrombocytopenia, hepatic encephalopathy, HFpEF, A-fib not on AC, adrenal insufficiency, hypertension MSSA bacteremia with presumed infective endocarditis on IV Ancef  and recent hospitalization from 10/2-10/8 at [facility] with acute hepatic encephalopathy and AKI return to [facility] with increased confusion and right gaze preference.  Ammonia level was elevated to 247.  VBG without significant finding.  Teleneurology consulted.  There was concern about seizure/CVA but patient was not a candidate for IV thrombolytics given esophageal varices and thrombocytopenia.  CT head and CT angio head and neck without acute finding but concern for bilateral bronchopneumonia.  He was loaded with Keppra and transferred to [facility] for neuro workup and EEG monitoring.  Further workup revealed Pseudomonas bacteremia.  Antibiotic escalated to IV cefepime after discussion with ID.   Patient has had worsening encephalopathy despite improvement in his ammonia level.  LTM was reviewed by neurology concerned, originally showing diffuse slowing but around noon patient developed burst suppression pattern but on no burst suppression therapy.  Neurology recommended stat CT of head due to severe low platelet count of 29,000.  PCCM was consulted and he was moved to ICU on 10/12.  Repeat CT head without acute finding.  MRI brain on 10/15 without acute finding.  Keppra discontinued.  Patient remained encephalopathic.  Palliative medicine involved.  Patient was transferred to hospitalist service on 10/16.  He was transition to full comfort care on 10/17 per discussion and lung palliative medicine, DSS (legal guardian) and stepson.  May end up being an  in-hospital death.  Assessment and Plan:  End-of-life care/full comfort care-initiated on 10/17 by palliative medicine after discussion with DSS and stepson.  - Appreciate help by palliative medicine - Continue comfort meds per palliative. - Anticipate in hospital death - Appears comfortable at this time  Decompensated liver cirrhosis Acute hepatic encephalopathy Acute metabolic encephalopathy Anion GAP Metabolic Acidosis  Hyperammonemia, improving History of esophageal varices Anemia of critical illness Acute on chronic thrombocytopenia Anasarca   Paroxysmal atrial fibrillation   AKI/acute urinary retention - Continue foley for comfort care.   Sepsis due to pseudomonas bacteremia   Infective endocarditis Chronic diastolic heart failure   Hypothyroidism   Adrenal insufficiency     Subjective: Patient seen at bedside this morning. No acute events overnight. Asleep but arouses to voice. Mumbles mostly incoherent responses and returns to sleep, but is able to provide his name.  No acute concerns. No changes.   Physical Exam: Vitals:   10/30/23 2322 10/31/23 1021 10/31/23 2138 11/01/23 0929  BP: 111/80 112/77 112/78 102/64  Pulse: 88 82 84 80  Resp: 18 17  16   Temp:      TempSrc:      SpO2: 100% 100% 100% 100%  Weight:      Height:       Physical Exam Constitutional:      General: He is not in acute distress.    Appearance: He is obese. He is ill-appearing.  Cardiovascular:     Rate and Rhythm: Normal rate and regular rhythm.     Heart sounds: Normal heart sounds. No murmur heard. Pulmonary:     Effort: Pulmonary effort is normal. No respiratory distress.  Abdominal:     General: There is no distension.  Palpations: Abdomen is soft.     Tenderness: There is no abdominal tenderness.  Skin:    General: Skin is warm and dry.     Capillary Refill: Capillary refill takes less than 2 seconds.  Neurological:     Mental Status: Mental status is at baseline.  He is lethargic.     Comments: Mostly asleep. Arouses to voice, mumbles incoherently, returns to sleep.      Family Communication: None  Disposition: Status is: Inpatient   Planned Discharge Destination: Anticipated hospital death    Time spent: 22 minutes  Author: Duffy Larch, MD 11/01/2023 12:28 PM  For on call review [email or web address].

## 2023-11-01 NOTE — Plan of Care (Signed)
  Problem: Education: Goal: Knowledge of General Education information will improve Description: Including pain rating scale, medication(s)/side effects and non-pharmacologic comfort measures Outcome: Progressing   Problem: Health Behavior/Discharge Planning: Goal: Ability to manage health-related needs will improve Outcome: Progressing   Problem: Clinical Measurements: Goal: Ability to maintain clinical measurements within normal limits will improve Outcome: Progressing Goal: Will remain free from infection Outcome: Progressing Goal: Diagnostic test results will improve Outcome: Progressing Goal: Respiratory complications will improve Outcome: Progressing Goal: Cardiovascular complication will be avoided Outcome: Progressing   Problem: Activity: Goal: Risk for activity intolerance will decrease Outcome: Progressing   Problem: Nutrition: Goal: Adequate nutrition will be maintained Outcome: Progressing   Problem: Coping: Goal: Level of anxiety will decrease Outcome: Progressing   Problem: Elimination: Goal: Will not experience complications related to bowel motility Outcome: Progressing Goal: Will not experience complications related to urinary retention Outcome: Progressing   Problem: Pain Managment: Goal: General experience of comfort will improve and/or be controlled Outcome: Progressing   Problem: Safety: Goal: Ability to remain free from injury will improve Outcome: Progressing   Problem: Skin Integrity: Goal: Risk for impaired skin integrity will decrease Outcome: Progressing   Problem: Education: Goal: Ability to describe self-care measures that may prevent or decrease complications (Diabetes Survival Skills Education) will improve Outcome: Progressing Goal: Individualized Educational Video(s) Outcome: Progressing   Problem: Coping: Goal: Ability to adjust to condition or change in health will improve Outcome: Progressing   Problem: Fluid  Volume: Goal: Ability to maintain a balanced intake and output will improve Outcome: Progressing   Problem: Health Behavior/Discharge Planning: Goal: Ability to identify and utilize available resources and services will improve Outcome: Progressing Goal: Ability to manage health-related needs will improve Outcome: Progressing   Problem: Metabolic: Goal: Ability to maintain appropriate glucose levels will improve Outcome: Progressing   Problem: Nutritional: Goal: Maintenance of adequate nutrition will improve Outcome: Progressing Goal: Progress toward achieving an optimal weight will improve Outcome: Progressing   Problem: Skin Integrity: Goal: Risk for impaired skin integrity will decrease Outcome: Progressing   Problem: Tissue Perfusion: Goal: Adequacy of tissue perfusion will improve Outcome: Progressing   Problem: Education: Goal: Knowledge of the prescribed therapeutic regimen will improve Outcome: Progressing   Problem: Coping: Goal: Ability to identify and develop effective coping behavior will improve Outcome: Progressing   Problem: Respiratory: Goal: Verbalizations of increased ease of respirations will increase Outcome: Progressing   Problem: Role Relationship: Goal: Family's ability to cope with current situation will improve Outcome: Progressing Goal: Ability to verbalize concerns, feelings, and thoughts to partner or family member will improve Outcome: Progressing   Problem: Pain Management: Goal: Satisfaction with pain management regimen will improve Outcome: Progressing

## 2023-11-01 NOTE — Plan of Care (Signed)
  Problem: Coping: Goal: Level of anxiety will decrease Outcome: Progressing   Problem: Pain Managment: Goal: General experience of comfort will improve and/or be controlled Outcome: Progressing   Problem: Safety: Goal: Ability to remain free from injury will improve Outcome: Progressing   Problem: Education: Goal: Knowledge of General Education information will improve Description: Including pain rating scale, medication(s)/side effects and non-pharmacologic comfort measures Outcome: Not Progressing   Problem: Health Behavior/Discharge Planning: Goal: Ability to manage health-related needs will improve Outcome: Not Progressing   Problem: Clinical Measurements: Goal: Ability to maintain clinical measurements within normal limits will improve Outcome: Not Progressing Goal: Will remain free from infection Outcome: Not Progressing Goal: Diagnostic test results will improve Outcome: Not Progressing Goal: Respiratory complications will improve Outcome: Not Progressing   Problem: Activity: Goal: Risk for activity intolerance will decrease Outcome: Not Progressing   Problem: Nutrition: Goal: Adequate nutrition will be maintained Outcome: Not Progressing   Problem: Elimination: Goal: Will not experience complications related to bowel motility Outcome: Not Progressing Goal: Will not experience complications related to urinary retention Outcome: Not Progressing   Problem: Skin Integrity: Goal: Risk for impaired skin integrity will decrease Outcome: Not Progressing   Problem: Education: Goal: Ability to describe self-care measures that may prevent or decrease complications (Diabetes Survival Skills Education) will improve Outcome: Not Progressing

## 2023-11-01 NOTE — Progress Notes (Signed)
 Daily Progress Note   Patient Name: [name]       Date: 11/01/2023 DOB: [birth year]  Age: 58 y.o. MRN#: [id number] Attending Physician: Al-Sultani, Anmar, MD Primary Care Physician: Carleen Dallas PARAS, FNP (Inactive) Admit Date: 10/19/2023  Reason for Consultation/Follow-up: Non pain symptom management, Pain control, Psychosocial/spiritual support, and Terminal Care  Subjective: I have reviewed medical records including EPIC notes, MAR. Noted PRN dilaudid  bolus given x2 in the last 24 hours. Received report from primary RN - no acute concerns. RN questions if sacral dressing change still needs to be completed three times per day - reports it causes him pain and requires three people - will change orders to daily and PRN and request to premedicate for pain prior.  Went to visit patient at bedside - no family/visitors present. Patient was lying in bed unresponsive. Primary NT at bedside obtaining vital signs - unable to get temperature. Patient is cold to touch. Ok not to pursue additional means to read temperature - RN notified. Mild upper body twitching noted though does not seem to be causing distress. No signs or non-verbal gestures of pain or discomfort noted. No respiratory distress, increased work of breathing, or secretions noted. Dilaudid  infusion running at 0.5ml/hr.   Length of Stay: 12  Current Medications: Scheduled Meds:   collagenase   Topical Daily   Gerhardt's butt cream   Topical TID   hydrocerin   Topical BID    Continuous Infusions:  HYDROmorphone  0.5 mg/hr (10/30/23 0932)    PRN Meds: acetaminophen  **OR** acetaminophen , antiseptic oral rinse, artificial tears, glycopyrrolate  **OR** glycopyrrolate  **OR** glycopyrrolate , haloperidol **OR** haloperidol **OR**  haloperidol lactate, HYDROmorphone , LORazepam  **OR** LORazepam  **OR** LORazepam , ondansetron  **OR** ondansetron  (ZOFRAN ) IV, mouth rinse  Physical Exam Vitals and nursing note reviewed.  Constitutional:      General: He is not in acute distress.    Appearance: He is ill-appearing.  Pulmonary:     Effort: No respiratory distress.  Skin:    General: Skin is cool and dry.     Coloration: Skin is pale.  Neurological:     Mental Status: He is unresponsive.     Motor: Weakness and tremor present.             Vital Signs: BP 102/64 (BP Location: Left Arm)   Pulse 80   Temp  97.7 F (36.5 C) (Axillary)   Resp 16   Ht 6' (1.829 m)   Wt (!) 165.9 kg   SpO2 100%   BMI 49.60 kg/m  SpO2: SpO2: 100 % O2 Device: O2 Device: Room Air O2 Flow Rate: O2 Flow Rate (L/min): 2 L/min  Intake/output summary:  Intake/Output Summary (Last 24 hours) at 11/01/2023 0931 Last data filed at 10/31/2023 2141 Gross per 24 hour  Intake 0 ml  Output 100 ml  Net -100 ml   LBM: Last BM Date : 11/01/23 Baseline Weight: Weight: (!) 161 kg Most recent weight: Weight: (!) 165.9 kg       Palliative Assessment/Data: PPS 10%      Patient Active Problem List   Diagnosis Date Noted   Acute hepatic encephalopathy (HCC) 10/20/2023   Alcoholic cirrhosis (HCC) 10/20/2023   Coagulopathy 10/20/2023   Infective endocarditis 10/20/2023   Adrenal insufficiency 10/20/2023   Pressure injury of right ankle, stage 4 (HCC) 10/20/2023   Acute kidney injury 10/11/2023   Prolonged QT interval 10/11/2023   Hyperammonemia 10/11/2023   Acute endocarditis 10/11/2023   Hypothyroidism 10/11/2023   GERD (gastroesophageal reflux disease) 10/11/2023   AKI (acute kidney injury) 10/11/2023   Staphylococcus aureus bacteremia 10/01/2023   Gram-positive bacteremia 09/30/2023   Paroxysmal atrial fibrillation (HCC) 09/29/2023   Hepatic encephalopathy (HCC) 09/29/2023   Acute metabolic encephalopathy 09/25/2023   Hyponatremia  04/04/2021   Esophageal varices (HCC) 04/28/2020   History of colonic polyps 04/28/2020   Alcohol abuse 04/28/2020   Acute shoulder pain due to trauma, right 10/12/2019   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 10/12/2019   Localized osteoarthritis of wrist 10/12/2019   Nontraumatic complete tear of right rotator cuff 10/12/2019   Cocaine abuse (HCC) 09/09/2019   1st degree AV block 08/17/2019   Lyme disease 08/17/2019   Fever of unknown origin 08/16/2019   Elevated AST (SGOT) 08/04/2019   Dyslipidemia 07/14/2019   Iron  deficiency anemia 07/08/2019   Migraine without aura 05/18/2019   Acute on chronic diastolic CHF (congestive heart failure) (HCC) 05/14/2019   Scrotal edema 04/22/2019   Bilateral hydrocele 04/22/2019   Pancytopenia (HCC)    Long-term current use of opiate analgesic 03/21/2019   Seizure-like activity (HCC) 03/21/2019   Meralgia paresthetica of left side 03/21/2019   Polyarthropathy 03/21/2019   Arm mass, right 07/31/2017   Bilateral primary osteoarthritis of knee 04/04/2017   Depression, recurrent 07/18/2016   Encounter for screening for other viral diseases 06/20/2016   Mixed hyperlipidemia 10/31/2015   Leukopenia 07/08/2015   Abdominal wall bulge 03/02/2015   Portal hypertension (HCC) 08/24/2014   Hypokalemia 08/24/2014   COPD, severity to be determined (HCC) 08/24/2014   Biliary colic 07/14/2014   CHF (congestive heart failure) (HCC) 07/14/2014   H/O ETOH abuse 07/14/2014   Thrombocytopenia 07/30/2013   Chronic pain disorder 07/30/2013   Edema 07/30/2013   Genu varus    Hypogonadism male 05/05/2012   Umbilical hernia without obstruction and without gangrene 12/26/2011   Asthma 08/28/2011   Cirrhosis (HCC) 08/14/2011   UNSPECIFIED TACHYCARDIA 02/01/2010   Gout 05/22/2008   Hypertension, essential 05/22/2008   Atrial fibrillation, chronic (HCC) 05/22/2008   DIASTOLIC HEART FAILURE, CHRONIC 05/22/2008   Acute on chronic diastolic  heart failure (HCC) 94/84/7989   Morbid obesity with BMI of 50.0-59.9, adult (HCC) 02/21/2007   Coronary atherosclerosis 02/21/2007   Obstructive sleep apnea of adult 02/04/2007    Palliative Care Assessment & Plan   Patient Profile:  58 y.o. male  with past medical history of EtOH cirrhosis, portal vein thrombosis, thrombocytopenia, esophageal varices with recent admission for hepatic encephalopathy, MSSA bacteremia with infective endocarditis on cefazolin  therapy, adrenal insufficiency who presented with altered mental status.  He was admitted on 10/19/2023 with altered mental status, hepatic encephalopathy, sepsis due to Pseudomonas bacteremia, history of MSSA endocarditis, adrenal insufficiency, AKI.    Palliative medicine was consulted for GOC conversations and attempts to facilitate decision making between medical team and emergency legal guardian.  Assessment: Principal Problem:   Acute hepatic encephalopathy (HCC) Active Problems:   Thrombocytopenia   Seizure-like activity (HCC)   Paroxysmal atrial fibrillation (HCC)   Hypothyroidism   AKI (acute kidney injury)   Alcoholic cirrhosis (HCC)   Coagulopathy   Infective endocarditis   Adrenal insufficiency   Pressure injury of right ankle, stage 4 (HCC) Terminal care  Recommendations/Plan: Continue full comfort measures Continue DNR/DNI as previously documented Anticipate hospital death Continue current comfort focused medication regimen as noted below - no changes Adjusted orders for sacral wound care from TID to daily and PRN. Premedicate for pain prior PMT will continue to follow and support holistically   Symptom Management Continuous dilaudid  infusion; bolus doses for breakthrough pain/dyspnea/increased work of breathing/RR>25 Tylenol  PRN pain/fever Biotin PRN dry mouth Robinul  PRN secretions Haldol PRN agitation/delirium Ativan  PRN anxiety/seizure/sleep/distress Zofran  PRN nausea/vomiting Liquifilm Tears PRN dry  eye  Goals of Care and Additional Recommendations: Limitations on Scope of Treatment: Full Comfort Care  Code Status:    Code Status Orders  (From admission, onward)           Start     Ordered   10/25/23 1055  Do not attempt resuscitation (DNR) - Comfort care  Continuous       Question Answer Comment  If patient has no pulse and is not breathing Do Not Attempt Resuscitation   In Pre-Arrest Conditions (Patient Is Breathing and Has a Pulse) Provide comfort measures. Relieve any mechanical airway obstruction. Avoid transfer unless required for comfort.   Consent: Discussion documented in EHR or advanced directives reviewed      10/25/23 1056           Code Status History     Date Active Date Inactive Code Status Order ID Comments User Context   10/22/2023 1751 10/25/2023 1056 Limited: Do not attempt resuscitation (DNR) -DNR-LIMITED -Do Not Intubate/DNI  [id number]  Theophilus Roosevelt, MD Inpatient   10/20/2023 0030 10/22/2023 1751 Full Code [id number]  Kenard Zachary PARAS, MD ED   10/20/2023 0003 10/20/2023 0030 Full Code [id number]  Kenard Zachary PARAS, MD ED   10/11/2023 0209 10/16/2023 2052 Full Code [id number]  Manfred Driver, DO Inpatient   09/25/2023 1737 10/05/2023 1857 Full Code [id number]  Pearlean Tully BRAVO, MD ED   05/14/2019 0951 05/18/2019 1554 Full Code [id number]  Jonel Lonni SQUIBB, MD Inpatient   04/22/2019 0546 04/25/2019 1723 Full Code [id number]  Mansy, Madison LABOR, MD ED       Prognosis:  days  Discharge Planning: Anticipated Hospital Death  Care plan was discussed with primary RN, primary NT  Thank you for allowing the Palliative Medicine Team to assist in the care of this patient.     Jeoffrey CHRISTELLA Sharps, NP  Please contact Palliative Medicine Team phone at [phone] for questions and concerns.   *Portions of this note are a verbal dictation therefore any spelling and/or grammatical errors are due to the Dragon Medical One system interpretation.

## 2023-11-02 DIAGNOSIS — I330 Acute and subacute infective endocarditis: Secondary | ICD-10-CM | POA: Diagnosis not present

## 2023-11-02 DIAGNOSIS — Z515 Encounter for palliative care: Secondary | ICD-10-CM | POA: Diagnosis not present

## 2023-11-02 DIAGNOSIS — Z66 Do not resuscitate: Secondary | ICD-10-CM | POA: Diagnosis not present

## 2023-11-02 DIAGNOSIS — K7682 Hepatic encephalopathy: Secondary | ICD-10-CM | POA: Diagnosis not present

## 2023-11-02 DIAGNOSIS — H510 Palsy (spasm) of conjugate gaze: Secondary | ICD-10-CM | POA: Diagnosis not present

## 2023-11-02 NOTE — Plan of Care (Signed)
  Problem: Education: Goal: Knowledge of General Education information will improve Description: Including pain rating scale, medication(s)/side effects and non-pharmacologic comfort measures Outcome: Progressing   Problem: Health Behavior/Discharge Planning: Goal: Ability to manage health-related needs will improve Outcome: Progressing   Problem: Clinical Measurements: Goal: Ability to maintain clinical measurements within normal limits will improve Outcome: Progressing Goal: Will remain free from infection Outcome: Progressing Goal: Diagnostic test results will improve Outcome: Progressing Goal: Respiratory complications will improve Outcome: Progressing Goal: Cardiovascular complication will be avoided Outcome: Progressing   Problem: Activity: Goal: Risk for activity intolerance will decrease Outcome: Progressing   Problem: Nutrition: Goal: Adequate nutrition will be maintained Outcome: Progressing   Problem: Coping: Goal: Level of anxiety will decrease Outcome: Progressing   Problem: Elimination: Goal: Will not experience complications related to bowel motility Outcome: Progressing Goal: Will not experience complications related to urinary retention Outcome: Progressing   Problem: Pain Managment: Goal: General experience of comfort will improve and/or be controlled Outcome: Progressing   Problem: Safety: Goal: Ability to remain free from injury will improve Outcome: Progressing   Problem: Skin Integrity: Goal: Risk for impaired skin integrity will decrease Outcome: Progressing   Problem: Education: Goal: Ability to describe self-care measures that may prevent or decrease complications (Diabetes Survival Skills Education) will improve Outcome: Progressing Goal: Individualized Educational Video(s) Outcome: Progressing   Problem: Coping: Goal: Ability to adjust to condition or change in health will improve Outcome: Progressing   Problem: Fluid  Volume: Goal: Ability to maintain a balanced intake and output will improve Outcome: Progressing   Problem: Health Behavior/Discharge Planning: Goal: Ability to identify and utilize available resources and services will improve Outcome: Progressing Goal: Ability to manage health-related needs will improve Outcome: Progressing   Problem: Metabolic: Goal: Ability to maintain appropriate glucose levels will improve Outcome: Progressing   Problem: Nutritional: Goal: Maintenance of adequate nutrition will improve Outcome: Progressing Goal: Progress toward achieving an optimal weight will improve Outcome: Progressing   Problem: Skin Integrity: Goal: Risk for impaired skin integrity will decrease Outcome: Progressing   Problem: Tissue Perfusion: Goal: Adequacy of tissue perfusion will improve Outcome: Progressing   Problem: Education: Goal: Knowledge of the prescribed therapeutic regimen will improve Outcome: Progressing   Problem: Coping: Goal: Ability to identify and develop effective coping behavior will improve Outcome: Progressing   Problem: Respiratory: Goal: Verbalizations of increased ease of respirations will increase Outcome: Progressing   Problem: Role Relationship: Goal: Family's ability to cope with current situation will improve Outcome: Progressing Goal: Ability to verbalize concerns, feelings, and thoughts to partner or family member will improve Outcome: Progressing   Problem: Pain Management: Goal: Satisfaction with pain management regimen will improve Outcome: Progressing

## 2023-11-02 NOTE — Plan of Care (Signed)
  Problem: Coping: Goal: Level of anxiety will decrease Outcome: Progressing   Problem: Pain Managment: Goal: General experience of comfort will improve and/or be controlled Outcome: Progressing   Problem: Skin Integrity: Goal: Risk for impaired skin integrity will decrease Outcome: Progressing

## 2023-11-02 NOTE — Progress Notes (Signed)
 Wasted 15 mls of dilaudid  from previous bag with Euna, RN

## 2023-11-02 NOTE — Progress Notes (Signed)
 Daily Progress Note   Patient Name: [name]       Date: 11/02/2023 DOB: April 26, 1965  Age: 58 y.o. MRN#: [id number] Attending Physician: Al-Sultani, Anmar, MD Primary Care Physician: Carleen Dallas PARAS, FNP (Inactive) Admit Date: 10/19/2023  Reason for Consultation/Follow-up: Non pain symptom management, Pain control, Psychosocial/spiritual support, and Terminal Care  Subjective: I have reviewed medical records including EPIC notes, MAR. Noted PRN dilaudid  bolus given x1 in the last 24 hours. Received report from primary RN - no acute concerns.   Went to visit patient at bedside - no family/visitors present. Patient was lying in bed unresponsive. Primary NT at bedside obtaining vital signs. Mild upper body twitching noted though does not seem to be causing distress. No signs or non-verbal gestures of pain or discomfort noted. No respiratory distress, increased work of breathing, or secretions noted. Dilaudid  infusion running at 0.5ml/hr.   Length of Stay: 13  Current Medications: Scheduled Meds:   collagenase   Topical Daily   Gerhardt's butt cream   Topical TID   hydrocerin   Topical BID    Continuous Infusions:  HYDROmorphone  (DILAUDID ) 50 mg in sodium chloride  0.9 % 50 mL (1 mg/mL) infusion 0.5 mg/hr (11/02/23 0234)    PRN Meds: acetaminophen  **OR** acetaminophen , antiseptic oral rinse, artificial tears, glycopyrrolate  **OR** glycopyrrolate  **OR** glycopyrrolate , haloperidol **OR** haloperidol **OR** haloperidol lactate, HYDROmorphone , LORazepam  **OR** LORazepam  **OR** LORazepam , ondansetron  **OR** ondansetron  (ZOFRAN ) IV, mouth rinse  Physical Exam Vitals and nursing note reviewed.  Constitutional:      General: He is not in acute distress.    Appearance: He is  ill-appearing.  Pulmonary:     Effort: No respiratory distress.  Skin:    General: Skin is cool and dry.     Coloration: Skin is pale.  Neurological:     Mental Status: He is unresponsive.     Motor: Weakness and tremor present.             Vital Signs: BP (!) 76/48 (BP Location: Left Arm)   Pulse 93   Temp 97.7 F (36.5 C) (Axillary)   Resp 13   Ht 6' (1.829 m)   Wt (!) 165.9 kg   SpO2 91%   BMI 49.60 kg/m  SpO2: SpO2: 91 % O2 Device: O2 Device: Room Air O2 Flow Rate: O2 Flow Rate (  L/min): 2 L/min  Intake/output summary: No intake or output data in the 24 hours ending 11/02/23 2306  LBM: Last BM Date : 11/02/23 Baseline Weight: Weight: (!) 161 kg Most recent weight: Weight: (!) 165.9 kg       Palliative Assessment/Data: PPS 10%      Patient Active Problem List   Diagnosis Date Noted   Acute hepatic encephalopathy (HCC) 10/20/2023   Alcoholic cirrhosis (HCC) 10/20/2023   Coagulopathy 10/20/2023   Infective endocarditis 10/20/2023   Adrenal insufficiency 10/20/2023   Pressure injury of right ankle, stage 4 (HCC) 10/20/2023   Acute kidney injury 10/11/2023   Prolonged QT interval 10/11/2023   Hyperammonemia 10/11/2023   Acute endocarditis 10/11/2023   Hypothyroidism 10/11/2023   GERD (gastroesophageal reflux disease) 10/11/2023   AKI (acute kidney injury) 10/11/2023   Staphylococcus aureus bacteremia 10/01/2023   Gram-positive bacteremia 09/30/2023   Paroxysmal atrial fibrillation (HCC) 09/29/2023   Hepatic encephalopathy (HCC) 09/29/2023   Acute metabolic encephalopathy 09/25/2023   Hyponatremia 04/04/2021   Esophageal varices (HCC) 04/28/2020   History of colonic polyps 04/28/2020   Alcohol abuse 04/28/2020   Acute shoulder pain due to trauma, right 10/12/2019   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 10/12/2019   Localized osteoarthritis of wrist 10/12/2019   Nontraumatic complete tear of right rotator cuff 10/12/2019   Cocaine  abuse (HCC) 09/09/2019   1st degree AV block 08/17/2019   Lyme disease 08/17/2019   Fever of unknown origin 08/16/2019   Elevated AST (SGOT) 08/04/2019   Dyslipidemia 07/14/2019   Iron  deficiency anemia 07/08/2019   Migraine without aura 05/18/2019   Acute on chronic diastolic CHF (congestive heart failure) (HCC) 05/14/2019   Scrotal edema 04/22/2019   Bilateral hydrocele 04/22/2019   Pancytopenia (HCC)    Long-term current use of opiate analgesic 03/21/2019   Seizure-like activity (HCC) 03/21/2019   Meralgia paresthetica of left side 03/21/2019   Polyarthropathy 03/21/2019   Arm mass, right 07/31/2017   Bilateral primary osteoarthritis of knee 04/04/2017   Depression, recurrent 07/18/2016   Encounter for screening for other viral diseases 06/20/2016   Mixed hyperlipidemia 10/31/2015   Leukopenia 07/08/2015   Abdominal wall bulge 03/02/2015   Portal hypertension (HCC) 08/24/2014   Hypokalemia 08/24/2014   COPD, severity to be determined (HCC) 08/24/2014   Biliary colic 07/14/2014   CHF (congestive heart failure) (HCC) 07/14/2014   H/O ETOH abuse 07/14/2014   Thrombocytopenia 07/30/2013   Chronic pain disorder 07/30/2013   Edema 07/30/2013   Genu varus    Hypogonadism male 05/05/2012   Umbilical hernia without obstruction and without gangrene 12/26/2011   Asthma 08/28/2011   Cirrhosis (HCC) 08/14/2011   UNSPECIFIED TACHYCARDIA 02/01/2010   Gout 05/22/2008   Hypertension, essential 05/22/2008   Atrial fibrillation, chronic (HCC) 05/22/2008   DIASTOLIC HEART FAILURE, CHRONIC 05/22/2008   Acute on chronic diastolic heart failure (HCC) 05/22/2008   Morbid obesity with BMI of 50.0-59.9, adult (HCC) 02/21/2007   Coronary atherosclerosis 02/21/2007   Obstructive sleep apnea of adult 02/04/2007    Palliative Care Assessment & Plan   Patient Profile: 58 y.o. male  with past medical history of EtOH cirrhosis, portal vein thrombosis, thrombocytopenia, esophageal varices with  recent admission for hepatic encephalopathy, MSSA bacteremia with infective endocarditis on cefazolin  therapy, adrenal insufficiency who presented with altered mental status.  He was admitted on 10/19/2023 with altered mental status, hepatic encephalopathy, sepsis due to Pseudomonas bacteremia, history of MSSA endocarditis, adrenal insufficiency, AKI.    Palliative  medicine was consulted for GOC conversations and attempts to facilitate decision making between medical team and emergency legal guardian.  Assessment: Principal Problem:   Acute hepatic encephalopathy (HCC) Active Problems:   Thrombocytopenia   Seizure-like activity (HCC)   Paroxysmal atrial fibrillation (HCC)   Hypothyroidism   AKI (acute kidney injury)   Alcoholic cirrhosis (HCC)   Coagulopathy   Infective endocarditis   Adrenal insufficiency   Pressure injury of right ankle, stage 4 (HCC) Terminal care  Recommendations/Plan: Continue full comfort measures Continue DNR/DNI as previously documented Anticipate hospital death Continue current comfort focused medication regimen as noted below - no changes PMT will continue to follow and support holistically   Symptom Management Continuous dilaudid  infusion; bolus doses for breakthrough pain/dyspnea/increased work of breathing/RR>25 Tylenol  PRN pain/fever Biotin PRN dry mouth Robinul  PRN secretions Haldol PRN agitation/delirium Ativan  PRN anxiety/seizure/sleep/distress Zofran  PRN nausea/vomiting Liquifilm Tears PRN dry eye  Goals of Care and Additional Recommendations: Limitations on Scope of Treatment: Full Comfort Care  Code Status:    Code Status Orders  (From admission, onward)           Start     Ordered   10/25/23 1055  Do not attempt resuscitation (DNR) - Comfort care  Continuous       Question Answer Comment  If patient has no pulse and is not breathing Do Not Attempt Resuscitation   In Pre-Arrest Conditions (Patient Is Breathing and Has a Pulse)  Provide comfort measures. Relieve any mechanical airway obstruction. Avoid transfer unless required for comfort.   Consent: Discussion documented in EHR or advanced directives reviewed      10/25/23 1056           Code Status History     Date Active Date Inactive Code Status Order ID Comments User Context   10/22/2023 1751 10/25/2023 1056 Limited: Do not attempt resuscitation (DNR) -DNR-LIMITED -Do Not Intubate/DNI  [id number]  Theophilus Roosevelt, MD Inpatient   10/20/2023 0030 10/22/2023 1751 Full Code [id number]  Kenard Zachary PARAS, MD ED   10/20/2023 0003 10/20/2023 0030 Full Code [id number]  Kenard Zachary PARAS, MD ED   10/11/2023 0209 10/16/2023 2052 Full Code [id number]  Manfred Driver, DO Inpatient   09/25/2023 1737 10/05/2023 1857 Full Code [id number]  Pearlean Tully BRAVO, MD ED   05/14/2019 0951 05/18/2019 1554 Full Code [id number]  Jonel Lonni SQUIBB, MD Inpatient   04/22/2019 0546 04/25/2019 1723 Full Code [id number]  Mansy, Madison LABOR, MD ED       Prognosis:  days  Discharge Planning: Anticipated Hospital Death  Care plan was discussed with primary RN, primary NT  Thank you for allowing the Palliative Medicine Team to assist in the care of this patient.     Jeoffrey CHRISTELLA Sharps, NP  Please contact Palliative Medicine Team phone at [phone] for questions and concerns.   *Portions of this note are a verbal dictation therefore any spelling and/or grammatical errors are due to the Dragon Medical One system interpretation.

## 2023-11-02 NOTE — Plan of Care (Signed)

## 2023-11-02 NOTE — Progress Notes (Signed)
 Progress Note   Patient: [name] FMW:[id number] DOB: 22-Feb-1965 DOA: 10/19/2023     13 DOS: the patient was seen and examined on 11/02/2023   Brief hospital course: 58 year old M with PMH of cirrhosis with mesenteric/portal vein thrombosis, esophageal varices, thrombocytopenia, hepatic encephalopathy, HFpEF, A-fib not on AC, adrenal insufficiency, hypertension MSSA bacteremia with presumed infective endocarditis on IV Ancef  and recent hospitalization from 10/2-10/8 at [facility] with acute hepatic encephalopathy and AKI return to [facility] with increased confusion and right gaze preference.  Ammonia level was elevated to 247.  VBG without significant finding.  Teleneurology consulted.  There was concern about seizure/CVA but patient was not a candidate for IV thrombolytics given esophageal varices and thrombocytopenia.  CT head and CT angio head and neck without acute finding but concern for bilateral bronchopneumonia.  He was loaded with Keppra and transferred to [facility] for neuro workup and EEG monitoring.  Further workup revealed Pseudomonas bacteremia.  Antibiotic escalated to IV cefepime after discussion with ID.   Patient has had worsening encephalopathy despite improvement in his ammonia level.  LTM was reviewed by neurology concerned, originally showing diffuse slowing but around noon patient developed burst suppression pattern but on no burst suppression therapy.  Neurology recommended stat CT of head due to severe low platelet count of 29,000.  PCCM was consulted and he was moved to ICU on 10/12.  Repeat CT head without acute finding.  MRI brain on 10/15 without acute finding.  Keppra discontinued.  Patient remained encephalopathic.  Palliative medicine involved.  Patient was transferred to hospitalist service on 10/16.  He was transition to full comfort care on 10/17 per discussion and lung palliative medicine, DSS (legal guardian) and stepson.  May end up being an  in-hospital death.  Assessment and Plan:  End-of-life care/full comfort care-initiated on 10/17 by palliative medicine after discussion with DSS and stepson.  - Appreciate help by palliative medicine - Continue comfort meds per palliative. - Anticipate in hospital death - Appears comfortable at this time  Decompensated liver cirrhosis Acute hepatic encephalopathy Acute metabolic encephalopathy Anion GAP Metabolic Acidosis  Hyperammonemia, improving History of esophageal varices Anemia of critical illness Acute on chronic thrombocytopenia Anasarca   Paroxysmal atrial fibrillation   AKI/acute urinary retention - Continue foley for comfort care.   Sepsis due to pseudomonas bacteremia   Infective endocarditis Chronic diastolic heart failure   Hypothyroidism   Adrenal insufficiency     Subjective: Patient seen at bedside this morning. No acute events overnight. Asleep but arouses to voice. Mumbles mostly incoherent responses and returns to sleep, but is able to provide his name.  Bradycardic to 50s today with hypotension to 85/58 and SpO2 of 84% on RA.    Physical Exam: Vitals:   10/31/23 2138 11/01/23 0929 11/02/23 0844 11/02/23 1752  BP: 112/78 102/64 (!) 85/58 (!) 82/47  Pulse: 84 80 (!) 56 (!) 55  Resp:  16 18 16   Temp:      TempSrc:      SpO2: 100% 100% (!) 84% 100%  Weight:      Height:       Physical Exam Constitutional:      General: He is not in acute distress.    Appearance: He is obese. He is ill-appearing.  Cardiovascular:     Rate and Rhythm: Normal rate and regular rhythm.     Heart sounds: Normal heart sounds. No murmur heard. Pulmonary:     Effort: Pulmonary effort is normal. No  respiratory distress.  Abdominal:     General: There is no distension.     Palpations: Abdomen is soft.     Tenderness: There is no abdominal tenderness.  Skin:    General: Skin is warm and dry.     Capillary Refill: Capillary refill takes less than 2 seconds.   Neurological:     Mental Status: Mental status is at baseline. He is lethargic.     Comments: Mostly asleep. Arouses to voice, mumbles incoherently, returns to sleep.      Family Communication: None  Disposition: Status is: Inpatient   Planned Discharge Destination: Anticipated hospital death    Time spent: 22 minutes  Author: Hatcher Froning Al-Sultani, MD 11/02/2023 8:22 PM  For on call review [email or web address].

## 2023-11-03 DIAGNOSIS — Z515 Encounter for palliative care: Secondary | ICD-10-CM | POA: Diagnosis not present

## 2023-11-03 DIAGNOSIS — K7682 Hepatic encephalopathy: Secondary | ICD-10-CM | POA: Diagnosis not present

## 2023-11-03 LAB — GLUCOSE, CAPILLARY: Glucose-Capillary: 32 mg/dL — CL (ref 70–99)

## 2023-11-04 NOTE — Progress Notes (Unsigned)
   11/04/2023  Patient ID: [name], male   DOB: 01/22/1965, 58 y.o.   MRN: [id number]  This patient is appearing on a report for being at risk of failing the adherence measure for cholesterol (statin) medications this calendar year.   Medication: Pravastatin  10 mg tablets Patient is deceased.   Insurance report was not up to date. No action needed at this time.   Ritchie Klee C. Lovie Zarling [facility] PharmD Candidate Class of [phone]

## 2023-11-07 DIAGNOSIS — R7881 Bacteremia: Secondary | ICD-10-CM | POA: Insufficient documentation

## 2023-11-09 NOTE — Progress Notes (Signed)
 Pt is experiencing some extra work of breathing, using accessory muscles and having apnea. Eyes are opeining while sleeping. This RN gave a bolus, eye drops and robinul  for secretions. BP is low but holding map above 50

## 2023-11-09 NOTE — Progress Notes (Signed)
 Patient has been pronounced at 1358. Family at bedside. IV dilaudid  was wasted-37 ML. Pronounced with Ginger RN. Doctor was notified

## 2023-11-09 NOTE — Progress Notes (Signed)
 Patient alternate contact -legal guardian called . Left message and phone number to give update when she calls. Patient BP low, will monitor for status. He is on comfort care, lethargic.

## 2023-11-09 NOTE — Progress Notes (Signed)
 Pt needed 2 more bolus' for air hunger. Robinul  helped the secretions. Pt still using accessory muscles, however bolus' helped to relieve some of the work of breathing. Will continue to monitor

## 2023-11-09 NOTE — Progress Notes (Signed)
 Daily Progress Note   Patient Name: [name]       Date: 26-Nov-2023 DOB: 10/14/1965  Age: 58 y.o. MRN#: [id number] Attending Physician: Al-Sultani, Anmar, MD Primary Care Physician: Carleen Dallas PARAS, FNP (Inactive) Admit Date: 10/19/2023  Reason for Consultation/Follow-up: Terminal Care   Length of Stay: 14  Current Medications: Scheduled Meds:   collagenase   Topical Daily   Gerhardt's butt cream   Topical TID   hydrocerin   Topical BID    Continuous Infusions:  HYDROmorphone  (DILAUDID ) 50 mg in sodium chloride  0.9 % 50 mL (1 mg/mL) infusion 0.5 mg/hr (11/02/23 0234)    PRN Meds: acetaminophen  **OR** acetaminophen , antiseptic oral rinse, artificial tears, glycopyrrolate  **OR** glycopyrrolate  **OR** glycopyrrolate , haloperidol **OR** haloperidol **OR** haloperidol lactate, HYDROmorphone , LORazepam  **OR** LORazepam  **OR** LORazepam , ondansetron  **OR** ondansetron  (ZOFRAN ) IV, mouth rinse  Physical Exam Vitals reviewed.  Constitutional:      General: He is sleeping. He is not in acute distress.    Appearance: He is ill-appearing.  HENT:     Mouth/Throat:     Mouth: Mucous membranes are dry.  Skin:    General: Skin is warm and dry.     Findings: Bruising and erythema (foot/ankle) present.             Vital Signs: BP (!) 58/39   Pulse 77   Temp 97.7 F (36.5 C) (Axillary)   Resp 18   Ht 6' (1.829 m)   Wt (!) 165.9 kg   SpO2 91%   BMI 49.60 kg/m  SpO2: SpO2: 91 % O2 Device: O2 Device: Room Air O2 Flow Rate: O2 Flow Rate (L/min): 2 L/min       Palliative Assessment/Data: 10%      Patient Active Problem List   Diagnosis Date Noted   Acute hepatic encephalopathy (HCC) 10/20/2023   Alcoholic cirrhosis (HCC) 10/20/2023   Coagulopathy 10/20/2023    Infective endocarditis 10/20/2023   Adrenal insufficiency 10/20/2023   Pressure injury of right ankle, stage 4 (HCC) 10/20/2023   Acute kidney injury 10/11/2023   Prolonged QT interval 10/11/2023   Hyperammonemia 10/11/2023   Acute endocarditis 10/11/2023   Hypothyroidism 10/11/2023   GERD (gastroesophageal reflux disease) 10/11/2023   AKI (acute kidney injury) 10/11/2023   Staphylococcus aureus bacteremia 10/01/2023   Gram-positive bacteremia 09/30/2023   Paroxysmal atrial  fibrillation (HCC) 09/29/2023   Hepatic encephalopathy (HCC) 09/29/2023   Acute metabolic encephalopathy 09/25/2023   Hyponatremia 04/04/2021   Esophageal varices (HCC) 04/28/2020   History of colonic polyps 04/28/2020   Alcohol abuse 04/28/2020   Acute shoulder pain due to trauma, right 10/12/2019   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 10/12/2019   Localized osteoarthritis of wrist 10/12/2019   Nontraumatic complete tear of right rotator cuff 10/12/2019   Cocaine abuse (HCC) 09/09/2019   1st degree AV block 08/17/2019   Lyme disease 08/17/2019   Fever of unknown origin 08/16/2019   Elevated AST (SGOT) 08/04/2019   Dyslipidemia 07/14/2019   Iron  deficiency anemia 07/08/2019   Migraine without aura 05/18/2019   Acute on chronic diastolic CHF (congestive heart failure) (HCC) 05/14/2019   Scrotal edema 04/22/2019   Bilateral hydrocele 04/22/2019   Pancytopenia (HCC)    Long-term current use of opiate analgesic 03/21/2019   Seizure-like activity (HCC) 03/21/2019   Meralgia paresthetica of left side 03/21/2019   Polyarthropathy 03/21/2019   Arm mass, right 07/31/2017   Bilateral primary osteoarthritis of knee 04/04/2017   Depression, recurrent 07/18/2016   Encounter for screening for other viral diseases 06/20/2016   Mixed hyperlipidemia 10/31/2015   Leukopenia 07/08/2015   Abdominal wall bulge 03/02/2015   Portal hypertension (HCC) 08/24/2014   Hypokalemia 08/24/2014   COPD, severity  to be determined (HCC) 08/24/2014   Biliary colic 07/14/2014   CHF (congestive heart failure) (HCC) 07/14/2014   H/O ETOH abuse 07/14/2014   Thrombocytopenia 07/30/2013   Chronic pain disorder 07/30/2013   Edema 07/30/2013   Genu varus    Hypogonadism male 05/05/2012   Umbilical hernia without obstruction and without gangrene 12/26/2011   Asthma 08/28/2011   Cirrhosis (HCC) 08/14/2011   UNSPECIFIED TACHYCARDIA 02/01/2010   Gout 05/22/2008   Hypertension, essential 05/22/2008   Atrial fibrillation, chronic (HCC) 05/22/2008   DIASTOLIC HEART FAILURE, CHRONIC 05/22/2008   Acute on chronic diastolic heart failure (HCC) 05/22/2008   Morbid obesity with BMI of 50.0-59.9, adult (HCC) 02/21/2007   Coronary atherosclerosis 02/21/2007   Obstructive sleep apnea of adult 02/04/2007    Palliative Care Assessment & Plan   Patient Profile: 58 y.o. male  with past medical history of EtOH cirrhosis, portal vein thrombosis, thrombocytopenia, esophageal varices with recent admission for hepatic encephalopathy, MSSA bacteremia with infective endocarditis on cefazolin  therapy, adrenal insufficiency who presented with altered mental status.  He was admitted on 10/19/2023 with altered mental status, hepatic encephalopathy, sepsis due to Pseudomonas bacteremia, history of MSSA endocarditis, adrenal insufficiency, AKI.    Palliative medicine was consulted for GOC conversations and attempts to facilitate decision making between medical team and emergency legal guardian.    Assessment: Chart reviewed and update received from nursing. Nurse reached out to legal guardian to share patient is actively dying. Nurse will attempt to reach out to family as well. Patient appears to be actively dying. He appears comfortable with continuous dilaudid  infusion and prn bolus doses as needed. PRN robinul  administered overnight. Plan is for in hospital death. No family at bedside. PMT will continue to  follow.   Recommendations/Plan: Continue comfort measures only -see MAR for medication orders Per previous notes, family requested in hospital death PMT will follow    Code Status:    Code Status Orders  (From admission, onward)           Start     Ordered   10/25/23 1055  Do not attempt resuscitation (DNR) -  Comfort care  Continuous       Question Answer Comment  If patient has no pulse and is not breathing Do Not Attempt Resuscitation   In Pre-Arrest Conditions (Patient Is Breathing and Has a Pulse) Provide comfort measures. Relieve any mechanical airway obstruction. Avoid transfer unless required for comfort.   Consent: Discussion documented in EHR or advanced directives reviewed      10/25/23 1056           Extensive chart review has been completed prior to seeing the patient including vital signs, progress/consult notes, orders, medications, and available advance directive documents.  Time spent: 25 minutes  Thank you for allowing the Palliative Medicine Team to assist in the care of this patient.    Stephane CHRISTELLA Palin, NP  Please contact Palliative Medicine Team phone at [phone] for questions and concerns.

## 2023-11-09 NOTE — Discharge Summary (Incomplete)
 Physician Discharge Summary   Patient: [name] MRN: [id number] DOB: May 03, 1965  Admit date:     10/19/2023  Discharge date: 03-Dec-2023  Discharge Physician: Duffy Al-Sultani   PCP: Glaesner, Edward J, FNP (Inactive)   Recommendations at discharge:  {Tip this will not be part of the note when signed- Example include specific recommendations for outpatient follow-up, pending tests to follow-up on. (Optional):26781}  ***  Discharge Diagnoses: Principal Problem:   Acute hepatic encephalopathy (HCC) Active Problems:   Thrombocytopenia   Seizure-like activity (HCC)   Paroxysmal atrial fibrillation (HCC)   Hypothyroidism   AKI (acute kidney injury)   Alcoholic cirrhosis (HCC)   Coagulopathy   Infective endocarditis   Adrenal insufficiency   Pressure injury of right ankle, stage 4 (HCC)  Resolved Problems:   * No resolved hospital problems. *  Hospital Course: 58 year old M with PMH of cirrhosis with mesenteric/portal vein thrombosis, esophageal varices, thrombocytopenia, hepatic encephalopathy, HFpEF, A-fib not on AC, adrenal insufficiency, hypertension MSSA bacteremia with presumed infective endocarditis on IV Ancef  and recent hospitalization from 10/2-10/8 at [facility] with acute hepatic encephalopathy and AKI return to [facility] with increased confusion and right gaze preference.  Ammonia level was elevated to 247.  VBG without significant finding.  Teleneurology consulted.  There was concern about seizure/CVA but patient was not a candidate for IV thrombolytics given esophageal varices and thrombocytopenia.  CT head and CT angio head and neck without acute finding but concern for bilateral bronchopneumonia.  He was loaded with Keppra and transferred to [facility] for neuro workup and EEG monitoring.  Further workup revealed Pseudomonas bacteremia.  Antibiotic escalated to IV cefepime after discussion with ID.   Patient has had worsening encephalopathy despite  improvement in his ammonia level.  LTM was reviewed by neurology concerned, originally showing diffuse slowing but around noon patient developed burst suppression pattern but on no burst suppression therapy.  Neurology recommended stat CT of head due to severe low platelet count of 29,000.  PCCM was consulted and he was moved to ICU on 10/12.  Repeat CT head without acute finding.  MRI brain on 10/15 without acute finding.  Keppra discontinued.  Patient remained encephalopathic.  Palliative medicine involved.  Patient was transferred to hospitalist service on 10/16.  He was transition to full comfort care on 10/17 per discussion and lung palliative medicine, DSS (legal guardian) and stepson.  May end up being an in-hospital death.  Assessment and Plan: * Acute hepatic encephalopathy (HCC) Profound increase in ammonia, likely the primary cause of the patient's obtundation No clinical evidence of recurrent infection or gastrointestinal bleeding as the cause. Patient unable to take oral intake, patient is n.p.o. Therefore lactulose  enemas have been ordered Severe hepatic encephalopathy can cause seizure, case discussed with Dr. Vanessa with neurology.  Teleneurology evaluation has also been obtained by [facility] ED provider. Keppra load has been initiated per neurology recommendations followed by scheduled Keppra dosing Transferring patient to [facility] for formal neurology consultation and EEG monitoring Close monitoring in progressive unit in case of clinical decline CT angiogram of head and neck per recommendation of neurology  Pressure injury of right ankle, stage 4 (HCC) Present on admission Wound care consultation placed.  Adrenal insufficiency Transitioning to intravenous hydrocortisone  twice daily while n.p.o. I do not think that stress dosing is necessary at this time  Infective endocarditis Patient diagnosed with suspected infective endocarditis in September due to MSSA  bacteremia TEE at the time was unable to be  performed due to history of esophageal varices Patient is currently on an empiric regimen of cefazolin  until 11/12/2023 Continuing cefazolin  via PICC line   Coagulopathy  History of chronic thrombocytopenia and coagulopathy with elevated INR due to advanced liver disease Patient is at extremely high risk of bleeding complications and therefore anticoagulation is being avoided Monitoring with serial CBCs and coagulation panels  Alcoholic cirrhosis (HCC) Profound increase in ammonia, likely the primary cause of the patient's obtundation No clinical evidence of recurrent infection or gastrointestinal bleeding as the cause. Patient unable to take oral intake, patient is n.p.o. Therefore lactulose  enemas have been ordered Severe hepatic encephalopathy can cause seizure, case discussed with Dr. Vanessa with neurology.  Teleneurology evaluation has also been obtained by [facility] ED provider. Keppra load has been initiated per neurology recommendations followed by scheduled Keppra dosing Transferring patient to [facility] for formal neurology consultation and EEG monitoring Close monitoring in progressive unit in case of clinical decline CT angiogram of head and neck per recommendation of neurology  AKI (acute kidney injury) Recurrent acute kidney injury with creatinine of 1.89, up from 1.26 four days ago Likely an increase secondary to poor oral intake.  This is a frequent occurrence with this patient has been struggling with recurrent bouts of hepatic encephalopathy Holding outpatient regimen of diuretics Gentle intravenous hydration to be mindful of avoiding overload in this patient with cirrhosis  Hypothyroidism Placing patient on reduced regimen of levothyroxine  IV while n.p.o.  Paroxysmal atrial fibrillation (HCC) Currently rate controlled and in sinus rhythm Historically patient is not on anticoagulation due to esophageal varices and  high bleeding risk Monitoring on telemetry Pending repeat EKG  Seizure-like activity (HCC) Right gaze preference that is sustained, likely secondary to either focal seizure activity, likely nonconvulsive status epilepticus. Keppra load and schedule dosing have been ordered Transferring to [facility] for neurology consultation and EEG monitoring Seizure activity likely secondary to extreme hepatic encephalopathy.  Thrombocytopenia  History of chronic thrombocytopenia and coagulopathy with elevated INR due to advanced liver disease Patient is at extremely high risk of bleeding complications and therefore anticoagulation is being avoided Monitoring with serial CBCs and coagulation panels      {Tip this will not be part of the note when signed Body mass index is 49.6 kg/m. ,  Nutrition Documentation    Flowsheet Row ED to Hosp-Admission (Discharged) from 10/19/2023 in [facility] GENERAL MED/SURG UNIT  Nutrition Problem Inadequate oral intake  Etiology inability to eat  Nutrition Goal Patient will meet greater than or equal to 90% of their needs  Interventions Tube feeding  ,  (Optional):26781}  {(NOTE) Pain control PDMP Statment (Optional):26782} Consultants: *** Procedures performed: ***  Disposition: {Plan; Disposition:26390} Diet recommendation:  {Diet_Plan:26776} DISCHARGE MEDICATION: Allergies as of 11/27/2023       Reactions   Allopurinol  Other (See Comments)   Joint pain worse Caused worsening gout   Fluoxetine Hcl Itching        Medication List     ASK your doctor about these medications    acetaminophen  325 MG tablet Commonly known as: TYLENOL  Take 650 mg by mouth every 8 (eight) hours as needed for mild pain (pain score 1-3).   aspirin  EC 325 MG tablet Take 162.5 mg by mouth daily as needed (anticoagulant).   HEPARIN  LOCK FLUSH IV Inject 3 mLs into the vein every 8 (eight) hours. Use 3 mL intravenously every 8 hours for flush of  non-valved catheter lumens after med admin. Flush with  10 mL of normal saline before med; 10 mL normal saline after med; followed by heparin .   midodrine 5 MG tablet Commonly known as: PROAMATINE Take 5 mg by mouth 2 (two) times daily with a meal.   NORMAL SALINE FLUSH IV Inject 10 mLs into the vein every 8 (eight) hours.   potassium chloride  SA 20 MEQ tablet Commonly known as: KLOR-CON  M Take 20 mEq by mouth daily.   rosuvastatin 5 MG tablet Commonly known as: CRESTOR Take 5 mg by mouth daily.   thiamine  100 MG tablet Commonly known as: VITAMIN B1 Take 100 mg by mouth daily.   torsemide  20 MG tablet Commonly known as: DEMADEX  Take 20 mg by mouth daily.   ZINC OXIDE (TOPICAL) 10 % Crea Apply 1 Application topically in the morning and at bedtime. Apply to reddened area to groin/scrotum for MASD        Discharge Exam: Filed Weights   10/23/23 2347 10/24/23 0445 10/25/23 0413  Weight: (!) 166.5 kg (!) 167.2 kg (!) 165.9 kg   ***  Condition at discharge: {DC Condition:26389}  The results of significant diagnostics from this hospitalization (including imaging, microbiology, ancillary and laboratory) are listed below for reference.   Imaging Studies: MR BRAIN WO CONTRAST Result Date: 10/23/2023 EXAM: MRI BRAIN WITHOUT CONTRAST 10/23/2023 07:05:11 PM TECHNIQUE: Multiplanar multisequence MRI of the head/brain was performed without the administration of intravenous contrast. Examination technically limited as the patient was unable to tolerate the full length of the exam. Diffusion weighted imaging, FLAIR, and GRE sequences only were performed. Additionally, provided images are severely degraded by motion. COMPARISON: Comparison made with prior CT from 10/20/2023. CLINICAL HISTORY: Mental status change, unknown cause. Limited study due to patient motion. Pt given meds and still proceeded to move. Agitated and incoherent. FINDINGS: BRAIN AND VENTRICLES: Cerebral volume within  normal limits. Mild patchy signal abnormality noted about the periventricular white matter, most likely related to chronic microvascular ischemic disease less than is typically seen for age. No visible acute or subacute infarct. No areas of chronic cortical infarction. No visible acute or chronic intracranial byproducts. No visible mass lesion. No midline shift or mass effect. No hydrocephalus or extra-axial fluid collection. The sella is unremarkable. Major intracranial vascular flow voids are grossly maintained at the skull base on this limited exam. ORBITS: No acute abnormality. SINUSES AND MASTOIDS: Paranasal sinuses are largely clear. No significant mastoid effusion. BONES AND SOFT TISSUES: Bone marrow signal intensity grossly within normal limits. No visible scalp soft tissue edema. Lobes and normal soft tissues grossly within normal limits. IMPRESSION: 1. Limited study due to patient motion and abbreviated sequence acquisition. 2. No acute intracranial abnormality identified on this limited, motion-degraded exam. Electronically signed by: Morene Hoard MD 10/23/2023 08:02 PM EDT RP Workstation: [id number]   Overnight EEG with video Result Date: 10/21/2023 Shelton Arlin KIDD, MD     10/22/2023  9:34 AM Patient Name: [name] MRN: [id number] Epilepsy Attending: Arlin KIDD Shelton Referring Physician/Provider: Khaliqdina, Salman, MD Duration: 10/20/2023 0815 to 10/21/2023 1230 Patient history: 58 yo M presented to [facility] with AMS and found to have an amnionia 247 with right gaze preference. EEG to evaluate for seizure Level of alertness: comatose/ lethargic AEDs during EEG study: LEV Technical aspects: This EEG study was done with scalp electrodes positioned according to the 10-20 International system of electrode placement. Electrical activity was reviewed with band pass filter of 1-70Hz , sensitivity of 7 uV/mm, display speed of 76mm/sec with a 60Hz  notched filter applied  as appropriate. EEG data were  recorded continuously and digitally stored.  Video monitoring was available and reviewed as appropriate. Description: EEG showed continuous generalized low amplitude 2-3hz  delta slowing. Breathing artifact was noted during the study. Hyperventilation and photic stimulation were not performed.   EEG was disconnected between 10/20/2023 2008 to 10/13/20225 0024 ABNORMALITY - Continuous slow, generalized IMPRESSION: This study is suggestive of generalized non specific cerebral dysfunction ( encephalopathy). No seizures or epileptiform discharges were seen throughout the recording. Priyanka MALVA Krebs   CT HEAD WO CONTRAST ( ) Result Date: 10/20/2023 EXAM: CT HEAD WITHOUT CONTRAST 10/20/2023 08:45:56 PM TECHNIQUE: CT of the head was performed without the administration of intravenous contrast. Automated exposure control, iterative reconstruction, and/or weight based adjustment of the mA/kV was utilized to reduce the radiation dose to as low as reasonably achievable. COMPARISON: CT head 10/19/2023. CLINICAL HISTORY: Neuro deficit, acute, stroke suspected. ams FINDINGS: BRAIN AND VENTRICLES: No acute hemorrhage. No evidence of acute infarct. No hydrocephalus. No extra-axial collection. No mass effect or midline shift. ORBITS: No acute abnormality. SINUSES: No acute abnormality. SOFT TISSUES AND SKULL: No acute soft tissue abnormality. No skull fracture. IMPRESSION: 1. No acute intracranial abnormality. Electronically signed by: Gilmore Molt MD 10/20/2023 08:52 PM EDT RP Workstation: [id number]   DG CHEST PORT 1 VIEW Result Date: 10/20/2023 CLINICAL DATA:  Aspiration EXAM: PORTABLE CHEST 1 VIEW COMPARISON:  10/11/2023 FINDINGS: Heart and mediastinal contours are within normal limits. No focal opacities or effusions. No acute bony abnormality. IMPRESSION: No active disease. Electronically Signed   By: Franky Crease M.D.   On: 10/20/2023 19:31   CT ANGIO HEAD NECK W WO CM Result Date: 10/20/2023 EXAM: CTA HEAD  AND NECK WITHOUT AND WITH 10/20/2023 04:19:52 AM TECHNIQUE: CTA of the head and neck was performed without and with the administration of 75 mL of intravenous contrast (iohexol  (OMNIPAQUE ) 350 MG/ML injection 75 mL IOHEXOL  350 MG/ML SOLN). Multiplanar 2D and/or 3D reformatted images are provided for review. Automated exposure control, iterative reconstruction, and/or weight based adjustment of the mA/kV was utilized to reduce the radiation dose to as low as reasonably achievable. Stenosis of the internal carotid arteries measured using NASCET criteria. COMPARISON: Head CT 10/19/2023. CLINICAL HISTORY: 58 year old male code stroke presentation. Neuro deficit, acute, stroke suspected. FINDINGS: CTA NECK: AORTIC ARCH AND ARCH VESSELS: No dissection or arterial injury. No significant stenosis of the brachiocephalic or subclavian arteries. Calcified left coronary artery atherosclerosis. 3 vessel arch. Mild arch atherosclerosis. CERVICAL CAROTID ARTERIES: Right common carotid artery and right carotid bifurcation are patent with some tortuosity. Carotid bifurcation stenosis is not evaluated. The right ICA remains patent to the skull base. Tortuous left CCA. Left carotid is patent with calcified atherosclerosis, probably no significant cervical left ICA stenosis. CERVICAL VERTEBRAL ARTERIES: Proximal subclavian arteries and vertebral artery origins are patent. The left vertebral artery is dominant and the left V1 segment is tortuous. Poor detail of the bilateral vertebral artery V2 segments. V3 segments of both vertebral arteries are patent to the skull base. However, the proximal right V4 segment appears occluded with retrograde supply to the right PICA from the vertebrobasilar junction. This occlusion appears to occur as the vessel crosses the skull base. Contralateral left vertebral artery remains patent and functionally supplies the basilar. No significant distal left vertebral artery stenosis. LUNGS AND MEDIASTINUM:  Bilateral upper lobe confluent peribronchial opacity, including a 5 cm area of involvement on the right. This appears inflammatory. Major airway motion artifact with atelectatic changes of the  carina. No pleural effusion in the upper lungs. SOFT TISSUES: Nonvascular neck soft tissue detail is obscured by motion. BONES: Limited bone detail, especially the mandible due to motion, cervical spine. Widespread chronic cervical disc and endplate degeneration. CTA HEAD: ANTERIOR CIRCULATION: Right ICA siphon is patent with mild to moderate calcified plaque and no stenosis. Normal right posterior communicating artery origin. Left ICA siphon is patent with mild to moderate calcified plaque and no significant stenosis. MCA and ACA origins are patent and normal. Normal anterior communicating artery. Bilateral ACA branches are patent with mild irregularity. Left MCA bifurcates early without stenosis. Right MCA M1 segment and trifurcation are patent without stenosis. Bilateral MCA branches are patent with mild irregularity. No aneurysm. POSTERIOR CIRCULATION: Tortuous basilar artery is patent with mild irregularity and no significant stenosis. Fetal right PCA origin. Normal SCA and left PCA origins. Bilateral PCA branches are patent with mild irregularity on the left. No significant stenosis of the vertebral arteries. No aneurysm. OTHER: No dural venous sinus thrombosis on this non-dedicated study. IMPRESSION: 1. Cervical carotid and vertebral arteries are patent but otherwise poorly evaluated due to motion artifact. 2. Distal right vertebral artery occlusion (likely in the V4 segment) with retrograde reconstitution to the right PICA. Left vertebral artery is patent and supplies the Basilar. 3. Bilateral ICA siphon calcified plaque without stenosis. And no significant intracranial stenosis outside of the abnormal right V4 (#2). 4. Bilateral upper lobe confluent peri-bronchial opacity suggesting acute bilateral Bronchopneumonia.  5. Coronary artery and aortic atherosclerosis. Electronically signed by: Helayne Hurst MD 10/20/2023 04:44 AM EDT RP Workstation: [id number]   CT HEAD CODE STROKE WO CONTRAST` Result Date: 10/19/2023 EXAM: CT HEAD WITHOUT CONTRAST 10/19/2023 10:57:10 PM TECHNIQUE: CT of the head was performed without the administration of intravenous contrast. Automated exposure control, iterative reconstruction, and/or weight based adjustment of the mA/kV was utilized to reduce the radiation dose to as low as reasonably achievable. COMPARISON: None available. CLINICAL HISTORY: Altered mental status, nontraumatic (Ped 0-17y). ams and right gaze. FINDINGS: Motion limited study.  Within this limiation: BRAIN AND VENTRICLES: No acute hemorrhage. No evidence of acute infarct. No hydrocephalus. No extra-axial collection. No mass effect or midline shift. ORBITS: No acute abnormality. SINUSES: No acute abnormality. SOFT TISSUES AND SKULL: No acute soft tissue abnormality. No skull fracture. IMPRESSION: 1. No acute intracranial abnormality. Electronically signed by: Gilmore Molt MD 10/19/2023 11:06 PM EDT RP Workstation: [id number]   DG CHEST PORT 1 VIEW Addendum Date: 10/11/2023 ******** ADDENDUM #1 ******** EXAM: 1 VIEW(S) XRAY OF THE CHEST 10/11/2023 02:56:07 PM COMPARISON: None available. CLINICAL HISTORY: 741019 PICC (peripherally inserted central catheter) in place [id number]. Picc Line placement PICC (peripherally inserted central catheter) in place [id number]. Picc Line placement FINDINGS: LINES, TUBES AND DEVICES: Consider retraction of PICC line 3 cm to the cavoatrial junction. LUNGS AND PLEURA: No focal pulmonary opacity. No pulmonary edema. No pleural effusion. No pneumothorax. HEART AND MEDIASTINUM: No acute abnormality of the cardiac and mediastinal silhouettes. BONES AND SOFT TISSUES: No acute osseous abnormality. IMPRESSION: 1. PICC line tip terminates distal to the cavoatrial junction 2. ; recommend retraction approximately  3 cm to position at the cavoatrial junction. Electronically signed by: Norleen Boxer MD 10/11/2023 04:23 PM EDT RP Workstation: [id number]   Result Date: 10/11/2023 ******** ORIGINAL REPORT ******** EXAM: 1 VIEW(S) XRAY OF THE CHEST 10/11/2023 02:56:07 PM COMPARISON: Radiograph 10/10/2023. CLINICAL HISTORY: 741019 PICC (peripherally inserted central catheter) in place [id number]. Picc Line placement PICC (peripherally inserted central catheter) in place [phone]. Picc Line  placement FINDINGS: LINES, TUBES AND DEVICES: PICC line with tip within the right atrium. LUNGS AND PLEURA: Low lung volumes. No focal pulmonary opacity. No pulmonary edema. No pleural effusion. No pneumothorax. HEART AND MEDIASTINUM: No acute abnormality of the cardiac and mediastinal silhouettes. BONES AND SOFT TISSUES: No acute osseous abnormality. IMPRESSION: 1. PICC line with tip within the right atrium. 2. Low lung volumes. 3. No pneumothorax. 4. No pulmonary edema. Electronically signed by: Norleen Boxer MD 10/11/2023 03:49 PM EDT RP Workstation: [id number]   US  EKG SITE RITE Result Date: 10/11/2023 If Site Rite image not attached, placement could not be confirmed due to current cardiac rhythm.  DG Chest Portable 1 View Result Date: 10/10/2023 CLINICAL DATA:  PICC placement EXAM: PORTABLE CHEST 1 VIEW COMPARISON:  10/04/2023 FINDINGS: Hypoventilatory changes with bandlike atelectasis or scarring in the left hilar area. Stable enlarged cardiomediastinal silhouette. No pleural effusion or pneumothorax. Right upper extremity central venous catheter tip difficult to visualize due to positioning and habitus, the tip appears to project over the right atrial region and is estimated to be about 3.5 cm deep to the expected location of cavoatrial junction. IMPRESSION: 1. Right upper extremity central venous catheter tip difficult to visualize due to positioning and habitus, the tip appears to project over the right atrial region and is estimated to  be about 3.5 cm deep to the expected location of cavoatrial junction. 2. Hypoventilatory changes with bandlike atelectasis or scarring in the left hilar area. Electronically Signed   By: Luke Bun M.D.   On: 10/10/2023 22:47    Microbiology: Results for orders placed or performed during the hospital encounter of 10/19/23  MRSA Next Gen by PCR, Nasal     Status: Abnormal   Collection Time: 10/20/23  8:59 PM   Specimen: Nasal Mucosa; Nasal Swab  Result Value Ref Range Status   MRSA by PCR Next Gen DETECTED (A) NOT DETECTED Final    Comment: RESULT CALLED TO, READ BACK BY AND VERIFIED WITH: CHRISTELLA PRINGLE RN 10/20/2023 @ 2258 BY AB (NOTE) The GeneXpert MRSA Assay (FDA approved for NASAL specimens only), is one component of a comprehensive MRSA colonization surveillance program. It is not intended to diagnose MRSA infection nor to guide or monitor treatment for MRSA infections. Test performance is not FDA approved in patients less than 67 years old. Performed at [facility], [address] [place], [place] [address]   Culture, blood (Routine X 2) w Reflex to ID Panel     Status: Abnormal   Collection Time: 10/20/23  9:33 PM   Specimen: BLOOD LEFT HAND  Result Value Ref Range Status   Specimen Description BLOOD LEFT HAND  Final   Special Requests   Final    BOTTLES DRAWN AEROBIC AND ANAEROBIC Blood Culture adequate volume   Culture  Setup Time   Final    GRAM NEGATIVE RODS IN BOTH AEROBIC AND ANAEROBIC BOTTLES CRITICAL RESULT CALLED TO, READ BACK BY AND VERIFIED WITH: MAYA CHARLENA SLADE [id number] @ 1610 FH Performed at [facility], [address] [place], [place] [address]    Culture PSEUDOMONAS AERUGINOSA (A)  Final   Report Status 10/24/2023 FINAL  Final   Organism ID, Bacteria PSEUDOMONAS AERUGINOSA  Final      Susceptibility   Pseudomonas aeruginosa - MIC*    MEROPENEM 4 INTERMEDIATE Intermediate     CIPROFLOXACIN  0.12 SENSITIVE Sensitive     PIP/TAZO Value in  next row Sensitive  8 SENSITIVEThis is a modified FDA-approved test that has been validated and its performance characteristics determined by the reporting laboratory.  This laboratory is certified under the Clinical Laboratory Improvement Amendments CLIA as qualified to perform high complexity clinical laboratory testing.    CEFEPIME Value in next row Sensitive      8 SENSITIVEThis is a modified FDA-approved test that has been validated and its performance characteristics determined by the reporting laboratory.  This laboratory is certified under the Clinical Laboratory Improvement Amendments CLIA as qualified to perform high complexity clinical laboratory testing.    CEFTAZIDIME Value in next row Sensitive      8 SENSITIVEThis is a modified FDA-approved test that has been validated and its performance characteristics determined by the reporting laboratory.  This laboratory is certified under the Clinical Laboratory Improvement Amendments CLIA as qualified to perform high complexity clinical laboratory testing.    IMIPENEM Value in next row Sensitive      8 SENSITIVEThis is a modified FDA-approved test that has been validated and its performance characteristics determined by the reporting laboratory.  This laboratory is certified under the Clinical Laboratory Improvement Amendments CLIA as qualified to perform high complexity clinical laboratory testing.    CEFTAZIDIME/AVIBACTAM Value in next row Sensitive      8 SENSITIVEThis is a modified FDA-approved test that has been validated and its performance characteristics determined by the reporting laboratory.  This laboratory is certified under the Clinical Laboratory Improvement Amendments CLIA as qualified to perform high complexity clinical laboratory testing.    CEFTOLOZANE/TAZOBACTAM Value in next row Sensitive      8 SENSITIVEThis is a modified FDA-approved test that has been validated and its performance characteristics determined by the  reporting laboratory.  This laboratory is certified under the Clinical Laboratory Improvement Amendments CLIA as qualified to perform high complexity clinical laboratory testing.    TOBRAMYCIN Value in next row Sensitive      8 SENSITIVEThis is a modified FDA-approved test that has been validated and its performance characteristics determined by the reporting laboratory.  This laboratory is certified under the Clinical Laboratory Improvement Amendments CLIA as qualified to perform high complexity clinical laboratory testing.    * PSEUDOMONAS AERUGINOSA  Blood Culture ID Panel (Reflexed)     Status: Abnormal   Collection Time: 10/20/23  9:33 PM  Result Value Ref Range Status   Enterococcus faecalis NOT DETECTED NOT DETECTED Final   Enterococcus Faecium NOT DETECTED NOT DETECTED Final   Listeria monocytogenes NOT DETECTED NOT DETECTED Final   Staphylococcus species NOT DETECTED NOT DETECTED Final   Staphylococcus aureus (BCID) NOT DETECTED NOT DETECTED Final   Staphylococcus epidermidis NOT DETECTED NOT DETECTED Final   Staphylococcus lugdunensis NOT DETECTED NOT DETECTED Final   Streptococcus species NOT DETECTED NOT DETECTED Final   Streptococcus agalactiae NOT DETECTED NOT DETECTED Final   Streptococcus pneumoniae NOT DETECTED NOT DETECTED Final   Streptococcus pyogenes NOT DETECTED NOT DETECTED Final   A.calcoaceticus-baumannii NOT DETECTED NOT DETECTED Final   Bacteroides fragilis NOT DETECTED NOT DETECTED Final   Enterobacterales NOT DETECTED NOT DETECTED Final   Enterobacter cloacae complex NOT DETECTED NOT DETECTED Final   Escherichia coli NOT DETECTED NOT DETECTED Final   Klebsiella aerogenes NOT DETECTED NOT DETECTED Final   Klebsiella oxytoca NOT DETECTED NOT DETECTED Final   Klebsiella pneumoniae NOT DETECTED NOT DETECTED Final   Proteus species NOT DETECTED NOT DETECTED Final   Salmonella species NOT DETECTED NOT DETECTED Final   Serratia marcescens NOT DETECTED NOT  DETECTED  Final   Haemophilus influenzae NOT DETECTED NOT DETECTED Final   Neisseria meningitidis NOT DETECTED NOT DETECTED Final   Pseudomonas aeruginosa DETECTED (A) NOT DETECTED Final    Comment: CRITICAL RESULT CALLED TO, READ BACK BY AND VERIFIED WITH: PHARMD ESABRA SLADE [id number] @ 1610 FH    Stenotrophomonas maltophilia NOT DETECTED NOT DETECTED Final   Candida albicans NOT DETECTED NOT DETECTED Final   Candida auris NOT DETECTED NOT DETECTED Final   Candida glabrata NOT DETECTED NOT DETECTED Final   Candida krusei NOT DETECTED NOT DETECTED Final   Candida parapsilosis NOT DETECTED NOT DETECTED Final   Candida tropicalis NOT DETECTED NOT DETECTED Final   Cryptococcus neoformans/gattii NOT DETECTED NOT DETECTED Final   CTX-M ESBL NOT DETECTED NOT DETECTED Final   Carbapenem resistance IMP NOT DETECTED NOT DETECTED Final   Carbapenem resistance KPC NOT DETECTED NOT DETECTED Final   Carbapenem resistance NDM NOT DETECTED NOT DETECTED Final   Carbapenem resistance VIM NOT DETECTED NOT DETECTED Final    Comment: Performed at [facility], [address], [place], [place] [address]  Culture, blood (Routine X 2) w Reflex to ID Panel     Status: Abnormal   Collection Time: 10/20/23  9:37 PM   Specimen: BLOOD LEFT HAND  Result Value Ref Range Status   Specimen Description BLOOD LEFT HAND  Final   Special Requests   Final    BOTTLES DRAWN AEROBIC AND ANAEROBIC Blood Culture adequate volume   Culture  Setup Time   Final    GRAM NEGATIVE RODS IN BOTH AEROBIC AND ANAEROBIC BOTTLES    Culture (A)  Final    PSEUDOMONAS AERUGINOSA SUSCEPTIBILITIES PERFORMED ON PREVIOUS CULTURE WITHIN THE LAST 5 DAYS. Performed at [facility], [address] [place], [place] [address]    Report Status 10/24/2023 FINAL  Final   *Note: Due to a large number of results and/or encounters for the requested time period, some results have not been displayed. A complete set of results can be found in Results  Review.    Labs: CBC: No results for input(s): WBC, NEUTROABS, HGB, HCT, MCV, PLT in the last 168 hours. Basic Metabolic Panel: No results for input(s): NA, K, CL, CO2, GLUCOSE, BUN, CREATININE, CALCIUM, MG, PHOS in the last 168 hours. Liver Function Tests: No results for input(s): AST, ALT, ALKPHOS, BILITOT, PROT, ALBUMIN  in the last 168 hours. CBG: Recent Labs  Lab 14-Nov-2023 0812  GLUCAP 32*    Discharge time spent: {LESS THAN/GREATER THAN:26388} 30 minutes.  Signed: Zayyan Mullen Al-Sultani, MD [facility] 11/07/2023

## 2023-11-09 NOTE — Plan of Care (Signed)
  Problem: Education: Goal: Knowledge of General Education information will improve Description: Including pain rating scale, medication(s)/side effects and non-pharmacologic comfort measures Outcome: Progressing   Problem: Health Behavior/Discharge Planning: Goal: Ability to manage health-related needs will improve Outcome: Progressing   Problem: Clinical Measurements: Goal: Ability to maintain clinical measurements within normal limits will improve Outcome: Progressing Goal: Will remain free from infection Outcome: Progressing Goal: Diagnostic test results will improve Outcome: Progressing Goal: Respiratory complications will improve Outcome: Progressing Goal: Cardiovascular complication will be avoided Outcome: Progressing   Problem: Activity: Goal: Risk for activity intolerance will decrease Outcome: Progressing   Problem: Nutrition: Goal: Adequate nutrition will be maintained Outcome: Progressing   Problem: Coping: Goal: Level of anxiety will decrease Outcome: Progressing   Problem: Elimination: Goal: Will not experience complications related to bowel motility Outcome: Progressing Goal: Will not experience complications related to urinary retention Outcome: Progressing   Problem: Pain Managment: Goal: General experience of comfort will improve and/or be controlled Outcome: Progressing   Problem: Safety: Goal: Ability to remain free from injury will improve Outcome: Progressing   Problem: Skin Integrity: Goal: Risk for impaired skin integrity will decrease Outcome: Progressing   Problem: Education: Goal: Ability to describe self-care measures that may prevent or decrease complications (Diabetes Survival Skills Education) will improve Outcome: Progressing Goal: Individualized Educational Video(s) Outcome: Progressing   Problem: Coping: Goal: Ability to adjust to condition or change in health will improve Outcome: Progressing   Problem: Fluid  Volume: Goal: Ability to maintain a balanced intake and output will improve Outcome: Progressing   Problem: Health Behavior/Discharge Planning: Goal: Ability to identify and utilize available resources and services will improve Outcome: Progressing Goal: Ability to manage health-related needs will improve Outcome: Progressing   Problem: Metabolic: Goal: Ability to maintain appropriate glucose levels will improve Outcome: Progressing   Problem: Nutritional: Goal: Maintenance of adequate nutrition will improve Outcome: Progressing Goal: Progress toward achieving an optimal weight will improve Outcome: Progressing   Problem: Skin Integrity: Goal: Risk for impaired skin integrity will decrease Outcome: Progressing   Problem: Tissue Perfusion: Goal: Adequacy of tissue perfusion will improve Outcome: Progressing   Problem: Education: Goal: Knowledge of the prescribed therapeutic regimen will improve Outcome: Progressing   Problem: Coping: Goal: Ability to identify and develop effective coping behavior will improve Outcome: Progressing   Problem: Respiratory: Goal: Verbalizations of increased ease of respirations will increase Outcome: Progressing   Problem: Role Relationship: Goal: Family's ability to cope with current situation will improve Outcome: Progressing Goal: Ability to verbalize concerns, feelings, and thoughts to partner or family member will improve Outcome: Progressing   Problem: Pain Management: Goal: Satisfaction with pain management regimen will improve Outcome: Progressing

## 2023-11-09 DEATH — deceased

## 2023-12-09 NOTE — Death Summary Note (Signed)
 DEATH SUMMARY   Patient Details  Name: [name] MRN: [id number] DOB: [birth year] ERE:[name], [name] PARAS, FNP (Inactive) Admission/Discharge Information   Admit Date:  2023/11/05  Date of Death: Date of Death: 2023-11-20  Time of Death: Time of Death: 12/04/1356  Length of Stay: 12/09/2023   Principle Cause of death: Multiorgan failure secondary to decompensated alcohol-related cirrhosis   Contributing Conditions: Pseudomonas bacteremia, hepatic encephalopathy, severe thrombocytopenia, heart failure with preserved ejection fraction, atrial fibrillation, and adrenal insufficiency.   Hospital Diagnoses: Principal Problem:   Acute hepatic encephalopathy (HCC) Active Problems:   Thrombocytopenia   Seizure-like activity (HCC)   Paroxysmal atrial fibrillation (HCC)   Hypothyroidism   AKI (acute kidney injury)   Alcoholic cirrhosis (HCC)   Coagulopathy   Infective endocarditis   Adrenal insufficiency   Pressure injury of right ankle, stage 4 (HCC)   Bacteremia due to Pseudomonas   Hospital Course: 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 He was recently hospitalized (10/2-10/8) for acute hepatic encephalopathy and AKI.  The patient presented to [facility] ED with increasing confusion and right gaze preference. Ammonia was elevated to 247 mol/L. CT head and CTA head/neck showed no acute intracranial abnormality but findings concerning for bilateral bronchopneumonia. He was loaded with levetiracetam and transferred to [facility] for further neurologic evaluation and EEG monitoring.  Continuous EEG initially showed diffuse slowing, later evolving to a burst-suppression pattern despite the  absence of sedating medications. Given severe thrombocytopenia (platelets 29 K), neurology recommended a stat CT head, which again showed no acute abnormality. PCCM was consulted, and the patient was transferred to the ICU on 10/12 for close monitoring.  Despite improvement in ammonia levels, the patient's encephalopathy worsened. Blood cultures grew Pseudomonas aeruginosa bacteremia, and antibiotics were escalated to IV cefepime after discussion with Infectious Disease. MRI brain on 10/15 showed no acute findings. Levetiracetam was subsequently discontinued.  He was transferred back to the hospitalist service on 10/16. Over the following day, his thrombocytopenia worsened (platelets 17 K), and liver enzymes continued to rise. The patient remained profoundly encephalopathic with poor prognosis.  Palliative Medicine was consulted, and discussions were held with the Department of Social Services (legal guardian) and the patient's stepson, [name]. The decision was made to transition to full comfort measures on 10/17. Comfort orders were implemented.  On Nov 20, 2023 at 13:58, the patient was pronounced deceased. Family was present at bedside at the time of death.   Procedures: None  Consultations: Neurology, Palliative medicine  The results of significant diagnostics from this hospitalization (including imaging, microbiology, ancillary and laboratory) are listed below for reference.   Significant Diagnostic Studies: MR BRAIN WO CONTRAST Result Date: 10/23/2023 EXAM: MRI BRAIN WITHOUT CONTRAST 10/23/2023 07:05:11 PM TECHNIQUE: Multiplanar multisequence MRI of the head/brain was performed without the administration of intravenous contrast. Examination technically limited as the patient was unable to tolerate the full length of the exam. Diffusion weighted imaging, FLAIR, and GRE sequences only were performed. Additionally, provided images are severely degraded by motion. COMPARISON: Comparison made  with prior CT from 10/20/2023. CLINICAL HISTORY: Mental status change, unknown cause. Limited study due to patient motion. Pt given meds and still proceeded to move. Agitated and incoherent. FINDINGS: BRAIN AND VENTRICLES: Cerebral volume within normal limits. Mild patchy signal abnormality noted about the  periventricular white matter, most likely related to chronic microvascular ischemic disease less than is typically seen for age. No visible acute or subacute infarct. No areas of chronic cortical infarction. No visible acute or chronic intracranial byproducts. No visible mass lesion. No midline shift or mass effect. No hydrocephalus or extra-axial fluid collection. The sella is unremarkable. Major intracranial vascular flow voids are grossly maintained at the skull base on this limited exam. ORBITS: No acute abnormality. SINUSES AND MASTOIDS: Paranasal sinuses are largely clear. No significant mastoid effusion. BONES AND SOFT TISSUES: Bone marrow signal intensity grossly within normal limits. No visible scalp soft tissue edema. Lobes and normal soft tissues grossly within normal limits. IMPRESSION: 1. Limited study due to patient motion and abbreviated sequence acquisition. 2. No acute intracranial abnormality identified on this limited, motion-degraded exam. Electronically signed by: Morene Hoard MD 10/23/2023 08:02 PM EDT RP Workstation: [id number]   Overnight EEG with video Result Date: 10/21/2023 [name] Arlin KIDD, MD     10/22/2023  9:34 AM Patient Name: [name] MRN: [id number] Epilepsy Attending: Arlin Gray [name] Referring Physician/Provider: Khaliqdina, Salman, MD Duration: 10/20/2023 0815 to 10/21/2023 1230 Patient history: 58 yo M presented to [facility] with AMS and found to have an amnionia 247 with right gaze preference. EEG to evaluate for seizure Level of alertness: comatose/ lethargic AEDs during EEG study: LEV Technical aspects: This EEG study was done with scalp electrodes positioned  according to the 10-20 International system of electrode placement. Electrical activity was reviewed with band pass filter of 1-70Hz , sensitivity of 7 uV/mm, display speed of 52mm/sec with a 60Hz  notched filter applied as appropriate. EEG data were recorded continuously and digitally stored.  Video monitoring was available and reviewed as appropriate. Description: EEG showed continuous generalized low amplitude 2-3hz  delta slowing. Breathing artifact was noted during the study. Hyperventilation and photic stimulation were not performed.   EEG was disconnected between 10/20/2023 2008 to 10/13/20225 0024 ABNORMALITY - Continuous slow, generalized IMPRESSION: This study is suggestive of generalized non specific cerebral dysfunction ( encephalopathy). No seizures or epileptiform discharges were seen throughout the recording. Priyanka Gray [name]   CT HEAD WO CONTRAST ( ) Result Date: 10/20/2023 EXAM: CT HEAD WITHOUT CONTRAST 10/20/2023 08:45:56 PM TECHNIQUE: CT of the head was performed without the administration of intravenous contrast. Automated exposure control, iterative reconstruction, and/or weight based adjustment of the mA/kV was utilized to reduce the radiation dose to as low as reasonably achievable. COMPARISON: CT head 10/19/2023. CLINICAL HISTORY: Neuro deficit, acute, stroke suspected. ams FINDINGS: BRAIN AND VENTRICLES: No acute hemorrhage. No evidence of acute infarct. No hydrocephalus. No extra-axial collection. No mass effect or midline shift. ORBITS: No acute abnormality. SINUSES: No acute abnormality. SOFT TISSUES AND SKULL: No acute soft tissue abnormality. No skull fracture. IMPRESSION: 1. No acute intracranial abnormality. Electronically signed by: Gilmore Molt MD 10/20/2023 08:52 PM EDT RP Workstation: [id number]   DG CHEST PORT 1 VIEW Result Date: 10/20/2023 CLINICAL DATA:  Aspiration EXAM: PORTABLE CHEST 1 VIEW COMPARISON:  10/11/2023 FINDINGS: Heart and mediastinal contours are within  normal limits. No focal opacities or effusions. No acute bony abnormality. IMPRESSION: No active disease. Electronically Signed   By: Franky Crease M.D.   On: 10/20/2023 19:31   CT ANGIO HEAD NECK W WO CM Result Date: 10/20/2023 EXAM: CTA HEAD AND NECK WITHOUT AND WITH 10/20/2023 04:19:52 AM TECHNIQUE: CTA of the head and neck was performed without and with the administration of 75 mL of intravenous contrast (iohexol  (OMNIPAQUE ) 350 MG/ML injection 75  mL IOHEXOL  350 MG/ML SOLN). Multiplanar 2D and/or 3D reformatted images are provided for review. Automated exposure control, iterative reconstruction, and/or weight based adjustment of the mA/kV was utilized to reduce the radiation dose to as low as reasonably achievable. Stenosis of the internal carotid arteries measured using NASCET criteria. COMPARISON: Head CT 10/19/2023. CLINICAL HISTORY: 58 year old male code stroke presentation. Neuro deficit, acute, stroke suspected. FINDINGS: CTA NECK: AORTIC ARCH AND ARCH VESSELS: No dissection or arterial injury. No significant stenosis of the brachiocephalic or subclavian arteries. Calcified left coronary artery atherosclerosis. 3 vessel arch. Mild arch atherosclerosis. CERVICAL CAROTID ARTERIES: Right common carotid artery and right carotid bifurcation are patent with some tortuosity. Carotid bifurcation stenosis is not evaluated. The right ICA remains patent to the skull base. Tortuous left CCA. Left carotid is patent with calcified atherosclerosis, probably no significant cervical left ICA stenosis. CERVICAL VERTEBRAL ARTERIES: Proximal subclavian arteries and vertebral artery origins are patent. The left vertebral artery is dominant and the left V1 segment is tortuous. Poor detail of the bilateral vertebral artery V2 segments. V3 segments of both vertebral arteries are patent to the skull base. However, the proximal right V4 segment appears occluded with retrograde supply to the right PICA from the vertebrobasilar  junction. This occlusion appears to occur as the vessel crosses the skull base. Contralateral left vertebral artery remains patent and functionally supplies the basilar. No significant distal left vertebral artery stenosis. LUNGS AND MEDIASTINUM: Bilateral upper lobe confluent peribronchial opacity, including a 5 cm area of involvement on the right. This appears inflammatory. Major airway motion artifact with atelectatic changes of the carina. No pleural effusion in the upper lungs. SOFT TISSUES: Nonvascular neck soft tissue detail is obscured by motion. BONES: Limited bone detail, especially the mandible due to motion, cervical spine. Widespread chronic cervical disc and endplate degeneration. CTA HEAD: ANTERIOR CIRCULATION: Right ICA siphon is patent with mild to moderate calcified plaque and no stenosis. Normal right posterior communicating artery origin. Left ICA siphon is patent with mild to moderate calcified plaque and no significant stenosis. MCA and ACA origins are patent and normal. Normal anterior communicating artery. Bilateral ACA branches are patent with mild irregularity. Left MCA bifurcates early without stenosis. Right MCA M1 segment and trifurcation are patent without stenosis. Bilateral MCA branches are patent with mild irregularity. No aneurysm. POSTERIOR CIRCULATION: Tortuous basilar artery is patent with mild irregularity and no significant stenosis. Fetal right PCA origin. Normal SCA and left PCA origins. Bilateral PCA branches are patent with mild irregularity on the left. No significant stenosis of the vertebral arteries. No aneurysm. OTHER: No dural venous sinus thrombosis on this non-dedicated study. IMPRESSION: 1. Cervical carotid and vertebral arteries are patent but otherwise poorly evaluated due to motion artifact. 2. Distal right vertebral artery occlusion (likely in the V4 segment) with retrograde reconstitution to the right PICA. Left vertebral artery is patent and supplies the  Basilar. 3. Bilateral ICA siphon calcified plaque without stenosis. And no significant intracranial stenosis outside of the abnormal right V4 (#2). 4. Bilateral upper lobe confluent peri-bronchial opacity suggesting acute bilateral Bronchopneumonia. 5. Coronary artery and aortic atherosclerosis. Electronically signed by: Helayne Hurst MD 10/20/2023 04:44 AM EDT RP Workstation: [id number]   CT HEAD CODE STROKE WO CONTRAST` Result Date: 10/19/2023 EXAM: CT HEAD WITHOUT CONTRAST 10/19/2023 10:57:10 PM TECHNIQUE: CT of the head was performed without the administration of intravenous contrast. Automated exposure control, iterative reconstruction, and/or weight based adjustment of the mA/kV was utilized to reduce the radiation dose to  as low as reasonably achievable. COMPARISON: None available. CLINICAL HISTORY: Altered mental status, nontraumatic (Ped 0-17y). ams and right gaze. FINDINGS: Motion limited study.  Within this limiation: BRAIN AND VENTRICLES: No acute hemorrhage. No evidence of acute infarct. No hydrocephalus. No extra-axial collection. No mass effect or midline shift. ORBITS: No acute abnormality. SINUSES: No acute abnormality. SOFT TISSUES AND SKULL: No acute soft tissue abnormality. No skull fracture. IMPRESSION: 1. No acute intracranial abnormality. Electronically signed by: Gilmore Molt MD 10/19/2023 11:06 PM EDT RP Workstation: [id number]   DG CHEST PORT 1 VIEW Addendum Date: 10/11/2023 ** ADDENDUM #1 ** EXAM: 1 VIEW(S) XRAY OF THE CHEST 10/11/2023 02:56:07 PM COMPARISON: None available. CLINICAL HISTORY: [id number] PICC (peripherally inserted central catheter) in place [id number]. Picc Line placement PICC (peripherally inserted central catheter) in place [phone]. Picc Line placement FINDINGS: LINES, TUBES AND DEVICES: Consider retraction of PICC line 3 cm to the cavoatrial junction. LUNGS AND PLEURA: No focal pulmonary opacity. No pulmonary edema. No pleural effusion. No pneumothorax. HEART AND  MEDIASTINUM: No acute abnormality of the cardiac and mediastinal silhouettes. BONES AND SOFT TISSUES: No acute osseous abnormality. IMPRESSION: 1. PICC line tip terminates distal to the cavoatrial junction 2. ; recommend retraction approximately 3 cm to position at the cavoatrial junction. Electronically signed by: Norleen Boxer MD 10/11/2023 04:23 PM EDT RP Workstation: [id number]   Result Date: 10/11/2023 ** ORIGINAL REPORT ** EXAM: 1 VIEW(S) XRAY OF THE CHEST 10/11/2023 02:56:07 PM COMPARISON: Radiograph 10/10/2023. CLINICAL HISTORY: [id number] PICC (peripherally inserted central catheter) in place [id number]. Picc Line placement PICC (peripherally inserted central catheter) in place [phone]. Picc Line placement FINDINGS: LINES, TUBES AND DEVICES: PICC line with tip within the right atrium. LUNGS AND PLEURA: Low lung volumes. No focal pulmonary opacity. No pulmonary edema. No pleural effusion. No pneumothorax. HEART AND MEDIASTINUM: No acute abnormality of the cardiac and mediastinal silhouettes. BONES AND SOFT TISSUES: No acute osseous abnormality. IMPRESSION: 1. PICC line with tip within the right atrium. 2. Low lung volumes. 3. No pneumothorax. 4. No pulmonary edema. Electronically signed by: Norleen Boxer MD 10/11/2023 03:49 PM EDT RP Workstation: [id number]    Microbiology: No results found for this or any previous visit (from the past 240 hours).  Time spent: 33 minutes  Signed: Willmar Stockinger Al-Sultani, MD 29-Nov-2023

## 2023-12-10 ENCOUNTER — Other Ambulatory Visit

## 2023-12-17 ENCOUNTER — Ambulatory Visit: Admitting: Physician Assistant
